# Patient Record
Sex: Female | Born: 1980 | Race: Black or African American | Hispanic: No | Marital: Single | Smoking: Never smoker
Health system: Southern US, Community
[De-identification: ages and names within clinical notes are randomized; demographics above are authoritative.]

## PROBLEM LIST (undated history)

## (undated) DIAGNOSIS — Z8669 Personal history of other diseases of the nervous system and sense organs: Secondary | ICD-10-CM

## (undated) DIAGNOSIS — F419 Anxiety disorder, unspecified: Secondary | ICD-10-CM

## (undated) DIAGNOSIS — G35 Multiple sclerosis: Secondary | ICD-10-CM

## (undated) DIAGNOSIS — N183 Chronic kidney disease, stage 3 unspecified: Secondary | ICD-10-CM

## (undated) DIAGNOSIS — I2699 Other pulmonary embolism without acute cor pulmonale: Secondary | ICD-10-CM

## (undated) DIAGNOSIS — N319 Neuromuscular dysfunction of bladder, unspecified: Secondary | ICD-10-CM

## (undated) DIAGNOSIS — Z86711 Personal history of pulmonary embolism: Secondary | ICD-10-CM

## (undated) DIAGNOSIS — M62838 Other muscle spasm: Secondary | ICD-10-CM

## (undated) DIAGNOSIS — I10 Essential (primary) hypertension: Secondary | ICD-10-CM

## (undated) DIAGNOSIS — Z8614 Personal history of Methicillin resistant Staphylococcus aureus infection: Secondary | ICD-10-CM

## (undated) DIAGNOSIS — I82409 Acute embolism and thrombosis of unspecified deep veins of unspecified lower extremity: Secondary | ICD-10-CM

## (undated) DIAGNOSIS — G43909 Migraine, unspecified, not intractable, without status migrainosus: Secondary | ICD-10-CM

## (undated) DIAGNOSIS — F32A Depression, unspecified: Secondary | ICD-10-CM

## (undated) DIAGNOSIS — N1330 Unspecified hydronephrosis: Secondary | ICD-10-CM

## (undated) DIAGNOSIS — K219 Gastro-esophageal reflux disease without esophagitis: Secondary | ICD-10-CM

## (undated) DIAGNOSIS — Z86718 Personal history of other venous thrombosis and embolism: Secondary | ICD-10-CM

## (undated) DIAGNOSIS — G8929 Other chronic pain: Secondary | ICD-10-CM

## (undated) DIAGNOSIS — R3916 Straining to void: Secondary | ICD-10-CM

## (undated) DIAGNOSIS — Z7901 Long term (current) use of anticoagulants: Secondary | ICD-10-CM

## (undated) DIAGNOSIS — R3915 Urgency of urination: Secondary | ICD-10-CM

## (undated) DIAGNOSIS — R269 Unspecified abnormalities of gait and mobility: Secondary | ICD-10-CM

## (undated) DIAGNOSIS — Z8739 Personal history of other diseases of the musculoskeletal system and connective tissue: Secondary | ICD-10-CM

## (undated) DIAGNOSIS — F329 Major depressive disorder, single episode, unspecified: Secondary | ICD-10-CM

## (undated) DIAGNOSIS — R531 Weakness: Secondary | ICD-10-CM

## (undated) HISTORY — PX: HIP ARTHROSCOPY: SUR88

## (undated) HISTORY — PX: REVISION TOTAL HIP ARTHROPLASTY: SHX766

## (undated) HISTORY — PX: TOTAL HIP ARTHROPLASTY: SHX124

## (undated) HISTORY — DX: Anxiety disorder, unspecified: F41.9

---
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
## 1998-03-30 ENCOUNTER — Emergency Department (HOSPITAL_COMMUNITY): Admission: EM | Admit: 1998-03-30 | Discharge: 1998-03-30 | Payer: Self-pay | Admitting: Emergency Medicine

## 1998-04-11 ENCOUNTER — Emergency Department (HOSPITAL_COMMUNITY): Admission: EM | Admit: 1998-04-11 | Discharge: 1998-04-11 | Payer: Self-pay | Admitting: Emergency Medicine

## 1998-10-23 ENCOUNTER — Emergency Department (HOSPITAL_COMMUNITY): Admission: EM | Admit: 1998-10-23 | Discharge: 1998-10-23 | Payer: Self-pay | Admitting: Emergency Medicine

## 1999-01-08 ENCOUNTER — Emergency Department (HOSPITAL_COMMUNITY): Admission: EM | Admit: 1999-01-08 | Discharge: 1999-01-08 | Payer: Self-pay | Admitting: Emergency Medicine

## 1999-05-24 ENCOUNTER — Other Ambulatory Visit: Admission: RE | Admit: 1999-05-24 | Discharge: 1999-05-24 | Payer: Self-pay | Admitting: Obstetrics

## 1999-05-24 ENCOUNTER — Encounter (INDEPENDENT_AMBULATORY_CARE_PROVIDER_SITE_OTHER): Payer: Self-pay

## 1999-06-14 ENCOUNTER — Emergency Department (HOSPITAL_COMMUNITY): Admission: EM | Admit: 1999-06-14 | Discharge: 1999-06-14 | Payer: Self-pay | Admitting: Emergency Medicine

## 1999-06-29 ENCOUNTER — Encounter: Admission: RE | Admit: 1999-06-29 | Discharge: 1999-06-29 | Payer: Self-pay | Admitting: Obstetrics & Gynecology

## 1999-07-07 ENCOUNTER — Emergency Department (HOSPITAL_COMMUNITY): Admission: EM | Admit: 1999-07-07 | Discharge: 1999-07-07 | Payer: Self-pay | Admitting: Emergency Medicine

## 1999-07-14 ENCOUNTER — Encounter: Admission: RE | Admit: 1999-07-14 | Discharge: 1999-07-14 | Payer: Self-pay | Admitting: Hematology and Oncology

## 1999-07-16 ENCOUNTER — Encounter: Admission: RE | Admit: 1999-07-16 | Discharge: 1999-07-16 | Payer: Self-pay | Admitting: Obstetrics & Gynecology

## 1999-09-14 ENCOUNTER — Encounter: Admission: RE | Admit: 1999-09-14 | Discharge: 1999-09-14 | Payer: Self-pay | Admitting: Obstetrics

## 1999-09-14 ENCOUNTER — Encounter (INDEPENDENT_AMBULATORY_CARE_PROVIDER_SITE_OTHER): Payer: Self-pay | Admitting: *Deleted

## 1999-09-14 ENCOUNTER — Other Ambulatory Visit: Admission: RE | Admit: 1999-09-14 | Discharge: 1999-09-14 | Payer: Self-pay | Admitting: Obstetrics

## 2000-01-25 ENCOUNTER — Other Ambulatory Visit: Admission: RE | Admit: 2000-01-25 | Discharge: 2000-01-25 | Payer: Self-pay | Admitting: Obstetrics & Gynecology

## 2000-01-25 ENCOUNTER — Encounter: Admission: RE | Admit: 2000-01-25 | Discharge: 2000-01-25 | Payer: Self-pay | Admitting: Obstetrics & Gynecology

## 2000-01-25 ENCOUNTER — Encounter (INDEPENDENT_AMBULATORY_CARE_PROVIDER_SITE_OTHER): Payer: Self-pay | Admitting: *Deleted

## 2000-01-25 LAB — CONVERTED CEMR LAB: Pap Smear: NORMAL

## 2000-07-11 ENCOUNTER — Emergency Department (HOSPITAL_COMMUNITY): Admission: EM | Admit: 2000-07-11 | Discharge: 2000-07-11 | Payer: Self-pay | Admitting: Emergency Medicine

## 2000-07-11 ENCOUNTER — Encounter: Payer: Self-pay | Admitting: Emergency Medicine

## 2000-07-28 ENCOUNTER — Emergency Department (HOSPITAL_COMMUNITY): Admission: EM | Admit: 2000-07-28 | Discharge: 2000-07-28 | Payer: Self-pay | Admitting: *Deleted

## 2000-10-29 ENCOUNTER — Emergency Department (HOSPITAL_COMMUNITY): Admission: EM | Admit: 2000-10-29 | Discharge: 2000-10-29 | Payer: Self-pay | Admitting: Emergency Medicine

## 2000-10-29 ENCOUNTER — Encounter: Payer: Self-pay | Admitting: Emergency Medicine

## 2001-03-05 ENCOUNTER — Encounter: Admission: RE | Admit: 2001-03-05 | Discharge: 2001-03-05 | Payer: Self-pay

## 2001-03-12 ENCOUNTER — Encounter: Admission: RE | Admit: 2001-03-12 | Discharge: 2001-03-12 | Payer: Self-pay

## 2001-03-27 ENCOUNTER — Encounter: Admission: RE | Admit: 2001-03-27 | Discharge: 2001-03-27 | Payer: Self-pay | Admitting: Obstetrics & Gynecology

## 2001-05-04 ENCOUNTER — Emergency Department (HOSPITAL_COMMUNITY): Admission: EM | Admit: 2001-05-04 | Discharge: 2001-05-04 | Payer: Self-pay | Admitting: Emergency Medicine

## 2001-05-04 ENCOUNTER — Encounter: Payer: Self-pay | Admitting: Emergency Medicine

## 2001-05-28 ENCOUNTER — Emergency Department (HOSPITAL_COMMUNITY): Admission: EM | Admit: 2001-05-28 | Discharge: 2001-05-28 | Payer: Self-pay | Admitting: Emergency Medicine

## 2001-09-21 ENCOUNTER — Emergency Department (HOSPITAL_COMMUNITY): Admission: EM | Admit: 2001-09-21 | Discharge: 2001-09-21 | Payer: Self-pay | Admitting: Emergency Medicine

## 2001-10-06 ENCOUNTER — Emergency Department (HOSPITAL_COMMUNITY): Admission: EM | Admit: 2001-10-06 | Discharge: 2001-10-07 | Payer: Self-pay | Admitting: Emergency Medicine

## 2001-10-07 ENCOUNTER — Encounter: Payer: Self-pay | Admitting: Emergency Medicine

## 2001-10-19 ENCOUNTER — Emergency Department (HOSPITAL_COMMUNITY): Admission: EM | Admit: 2001-10-19 | Discharge: 2001-10-19 | Payer: Self-pay | Admitting: Emergency Medicine

## 2001-11-23 ENCOUNTER — Encounter: Payer: Self-pay | Admitting: Emergency Medicine

## 2001-11-23 ENCOUNTER — Emergency Department (HOSPITAL_COMMUNITY): Admission: EM | Admit: 2001-11-23 | Discharge: 2001-11-24 | Payer: Self-pay | Admitting: Emergency Medicine

## 2001-12-02 ENCOUNTER — Emergency Department (HOSPITAL_COMMUNITY): Admission: EM | Admit: 2001-12-02 | Discharge: 2001-12-02 | Payer: Self-pay | Admitting: Emergency Medicine

## 2002-01-22 ENCOUNTER — Encounter: Admission: RE | Admit: 2002-01-22 | Discharge: 2002-02-22 | Payer: Self-pay | Admitting: Specialist

## 2002-01-25 ENCOUNTER — Encounter: Admission: RE | Admit: 2002-01-25 | Discharge: 2002-01-25 | Payer: Self-pay | Admitting: Internal Medicine

## 2002-02-15 ENCOUNTER — Encounter: Admission: RE | Admit: 2002-02-15 | Discharge: 2002-02-15 | Payer: Self-pay | Admitting: Internal Medicine

## 2002-03-07 DIAGNOSIS — Z8614 Personal history of Methicillin resistant Staphylococcus aureus infection: Secondary | ICD-10-CM

## 2002-03-07 HISTORY — DX: Personal history of Methicillin resistant Staphylococcus aureus infection: Z86.14

## 2002-03-19 ENCOUNTER — Encounter: Admission: RE | Admit: 2002-03-19 | Discharge: 2002-03-19 | Payer: Self-pay | Admitting: *Deleted

## 2002-03-20 ENCOUNTER — Encounter (INDEPENDENT_AMBULATORY_CARE_PROVIDER_SITE_OTHER): Payer: Self-pay | Admitting: *Deleted

## 2002-03-20 LAB — CONVERTED CEMR LAB: Microalbumin U total vol: 40 mg/L

## 2002-04-04 ENCOUNTER — Encounter: Admission: RE | Admit: 2002-04-04 | Discharge: 2002-04-04 | Payer: Self-pay | Admitting: Internal Medicine

## 2002-05-09 ENCOUNTER — Inpatient Hospital Stay (HOSPITAL_COMMUNITY): Admission: AD | Admit: 2002-05-09 | Discharge: 2002-05-09 | Payer: Self-pay | Admitting: *Deleted

## 2002-05-21 ENCOUNTER — Encounter: Admission: RE | Admit: 2002-05-21 | Discharge: 2002-05-21 | Payer: Self-pay | Admitting: Internal Medicine

## 2002-05-23 ENCOUNTER — Encounter: Payer: Self-pay | Admitting: Internal Medicine

## 2002-05-23 ENCOUNTER — Encounter: Admission: RE | Admit: 2002-05-23 | Discharge: 2002-05-23 | Payer: Self-pay | Admitting: Internal Medicine

## 2002-05-23 ENCOUNTER — Ambulatory Visit (HOSPITAL_COMMUNITY): Admission: RE | Admit: 2002-05-23 | Discharge: 2002-05-23 | Payer: Self-pay | Admitting: Internal Medicine

## 2002-05-28 ENCOUNTER — Encounter: Admission: RE | Admit: 2002-05-28 | Discharge: 2002-05-28 | Payer: Self-pay | Admitting: Internal Medicine

## 2002-06-18 ENCOUNTER — Other Ambulatory Visit: Admission: RE | Admit: 2002-06-18 | Discharge: 2002-06-18 | Payer: Self-pay | Admitting: *Deleted

## 2002-06-18 ENCOUNTER — Encounter (INDEPENDENT_AMBULATORY_CARE_PROVIDER_SITE_OTHER): Payer: Self-pay | Admitting: *Deleted

## 2002-06-18 ENCOUNTER — Encounter: Admission: RE | Admit: 2002-06-18 | Discharge: 2002-06-18 | Payer: Self-pay | Admitting: Obstetrics and Gynecology

## 2002-07-16 ENCOUNTER — Ambulatory Visit (HOSPITAL_COMMUNITY): Admission: RE | Admit: 2002-07-16 | Discharge: 2002-07-16 | Payer: Self-pay | Admitting: Internal Medicine

## 2002-07-16 ENCOUNTER — Encounter: Admission: RE | Admit: 2002-07-16 | Discharge: 2002-07-16 | Payer: Self-pay | Admitting: Internal Medicine

## 2002-07-31 ENCOUNTER — Emergency Department (HOSPITAL_COMMUNITY): Admission: EM | Admit: 2002-07-31 | Discharge: 2002-07-31 | Payer: Self-pay | Admitting: Emergency Medicine

## 2002-07-31 ENCOUNTER — Encounter: Payer: Self-pay | Admitting: Emergency Medicine

## 2002-09-16 ENCOUNTER — Encounter: Admission: RE | Admit: 2002-09-16 | Discharge: 2002-09-16 | Payer: Self-pay | Admitting: Obstetrics and Gynecology

## 2002-09-16 ENCOUNTER — Emergency Department (HOSPITAL_COMMUNITY): Admission: EM | Admit: 2002-09-16 | Discharge: 2002-09-16 | Payer: Self-pay | Admitting: Emergency Medicine

## 2002-10-28 ENCOUNTER — Encounter: Payer: Self-pay | Admitting: Specialist

## 2002-11-05 ENCOUNTER — Encounter: Payer: Self-pay | Admitting: Orthopedic Surgery

## 2002-11-05 ENCOUNTER — Inpatient Hospital Stay (HOSPITAL_COMMUNITY): Admission: RE | Admit: 2002-11-05 | Discharge: 2002-11-08 | Payer: Self-pay | Admitting: Specialist

## 2002-11-06 ENCOUNTER — Encounter: Payer: Self-pay | Admitting: Specialist

## 2002-11-15 ENCOUNTER — Emergency Department (HOSPITAL_COMMUNITY): Admission: EM | Admit: 2002-11-15 | Discharge: 2002-11-15 | Payer: Self-pay | Admitting: Emergency Medicine

## 2002-11-15 ENCOUNTER — Encounter: Payer: Self-pay | Admitting: Emergency Medicine

## 2002-12-10 ENCOUNTER — Encounter: Admission: RE | Admit: 2002-12-10 | Discharge: 2002-12-10 | Payer: Self-pay | Admitting: Obstetrics and Gynecology

## 2002-12-24 ENCOUNTER — Ambulatory Visit (HOSPITAL_COMMUNITY): Admission: RE | Admit: 2002-12-24 | Discharge: 2002-12-24 | Payer: Self-pay | Admitting: Orthopedic Surgery

## 2002-12-24 ENCOUNTER — Encounter: Payer: Self-pay | Admitting: Orthopedic Surgery

## 2002-12-27 ENCOUNTER — Encounter: Admission: RE | Admit: 2002-12-27 | Discharge: 2002-12-27 | Payer: Self-pay | Admitting: Orthopedic Surgery

## 2002-12-27 ENCOUNTER — Encounter: Payer: Self-pay | Admitting: Orthopedic Surgery

## 2003-01-10 ENCOUNTER — Encounter: Admission: RE | Admit: 2003-01-10 | Discharge: 2003-01-10 | Payer: Self-pay | Admitting: Internal Medicine

## 2003-01-24 ENCOUNTER — Inpatient Hospital Stay (HOSPITAL_COMMUNITY): Admission: AD | Admit: 2003-01-24 | Discharge: 2003-01-24 | Payer: Self-pay | Admitting: *Deleted

## 2003-03-08 DIAGNOSIS — Z86711 Personal history of pulmonary embolism: Secondary | ICD-10-CM

## 2003-03-08 HISTORY — DX: Personal history of pulmonary embolism: Z86.711

## 2003-03-10 ENCOUNTER — Encounter: Admission: RE | Admit: 2003-03-10 | Discharge: 2003-03-10 | Payer: Self-pay | Admitting: Obstetrics and Gynecology

## 2003-03-28 ENCOUNTER — Inpatient Hospital Stay (HOSPITAL_COMMUNITY): Admission: RE | Admit: 2003-03-28 | Discharge: 2003-04-02 | Payer: Self-pay | Admitting: Orthopedic Surgery

## 2003-04-11 ENCOUNTER — Emergency Department (HOSPITAL_COMMUNITY): Admission: EM | Admit: 2003-04-11 | Discharge: 2003-04-11 | Payer: Self-pay

## 2003-05-14 ENCOUNTER — Encounter: Admission: RE | Admit: 2003-05-14 | Discharge: 2003-05-14 | Payer: Self-pay | Admitting: Internal Medicine

## 2003-05-14 ENCOUNTER — Inpatient Hospital Stay (HOSPITAL_COMMUNITY): Admission: AD | Admit: 2003-05-14 | Discharge: 2003-05-16 | Payer: Self-pay | Admitting: Internal Medicine

## 2003-05-19 ENCOUNTER — Encounter: Admission: RE | Admit: 2003-05-19 | Discharge: 2003-05-19 | Payer: Self-pay | Admitting: Internal Medicine

## 2003-05-23 ENCOUNTER — Encounter: Admission: RE | Admit: 2003-05-23 | Discharge: 2003-05-23 | Payer: Self-pay | Admitting: Internal Medicine

## 2003-05-26 ENCOUNTER — Encounter: Admission: RE | Admit: 2003-05-26 | Discharge: 2003-05-26 | Payer: Self-pay | Admitting: Internal Medicine

## 2003-06-03 ENCOUNTER — Encounter: Admission: RE | Admit: 2003-06-03 | Discharge: 2003-06-03 | Payer: Self-pay | Admitting: Obstetrics and Gynecology

## 2003-06-03 ENCOUNTER — Encounter: Admission: RE | Admit: 2003-06-03 | Discharge: 2003-06-03 | Payer: Self-pay | Admitting: Internal Medicine

## 2003-06-03 ENCOUNTER — Other Ambulatory Visit: Admission: RE | Admit: 2003-06-03 | Discharge: 2003-06-03 | Payer: Self-pay | Admitting: Obstetrics & Gynecology

## 2003-06-03 ENCOUNTER — Encounter (INDEPENDENT_AMBULATORY_CARE_PROVIDER_SITE_OTHER): Payer: Self-pay | Admitting: Specialist

## 2003-06-12 ENCOUNTER — Encounter: Admission: RE | Admit: 2003-06-12 | Discharge: 2003-06-12 | Payer: Self-pay | Admitting: Internal Medicine

## 2003-06-23 ENCOUNTER — Encounter: Admission: RE | Admit: 2003-06-23 | Discharge: 2003-06-23 | Payer: Self-pay | Admitting: Internal Medicine

## 2003-07-07 ENCOUNTER — Encounter: Admission: RE | Admit: 2003-07-07 | Discharge: 2003-07-07 | Payer: Self-pay | Admitting: Internal Medicine

## 2003-07-14 ENCOUNTER — Emergency Department (HOSPITAL_COMMUNITY): Admission: EM | Admit: 2003-07-14 | Discharge: 2003-07-14 | Payer: Self-pay | Admitting: Family Medicine

## 2003-07-21 ENCOUNTER — Encounter: Admission: RE | Admit: 2003-07-21 | Discharge: 2003-07-21 | Payer: Self-pay | Admitting: Internal Medicine

## 2003-08-12 ENCOUNTER — Encounter: Admission: RE | Admit: 2003-08-12 | Discharge: 2003-08-12 | Payer: Self-pay | Admitting: Internal Medicine

## 2003-09-04 ENCOUNTER — Encounter: Admission: RE | Admit: 2003-09-04 | Discharge: 2003-09-04 | Payer: Self-pay | Admitting: Internal Medicine

## 2004-02-16 ENCOUNTER — Ambulatory Visit: Payer: Self-pay | Admitting: Internal Medicine

## 2004-03-26 ENCOUNTER — Inpatient Hospital Stay (HOSPITAL_COMMUNITY): Admission: AD | Admit: 2004-03-26 | Discharge: 2004-03-26 | Payer: Self-pay | Admitting: *Deleted

## 2004-03-30 ENCOUNTER — Ambulatory Visit: Payer: Self-pay | Admitting: Obstetrics & Gynecology

## 2004-06-08 ENCOUNTER — Ambulatory Visit: Payer: Self-pay | Admitting: Obstetrics & Gynecology

## 2004-06-08 ENCOUNTER — Encounter (INDEPENDENT_AMBULATORY_CARE_PROVIDER_SITE_OTHER): Payer: Self-pay | Admitting: *Deleted

## 2004-08-03 ENCOUNTER — Ambulatory Visit: Payer: Self-pay | Admitting: Obstetrics & Gynecology

## 2004-11-29 ENCOUNTER — Inpatient Hospital Stay (HOSPITAL_COMMUNITY): Admission: AD | Admit: 2004-11-29 | Discharge: 2004-11-29 | Payer: Self-pay | Admitting: *Deleted

## 2005-02-01 ENCOUNTER — Ambulatory Visit: Payer: Self-pay | Admitting: *Deleted

## 2005-02-01 ENCOUNTER — Encounter (INDEPENDENT_AMBULATORY_CARE_PROVIDER_SITE_OTHER): Payer: Self-pay | Admitting: *Deleted

## 2005-02-03 ENCOUNTER — Ambulatory Visit: Payer: Self-pay | Admitting: Internal Medicine

## 2005-02-07 ENCOUNTER — Emergency Department (HOSPITAL_COMMUNITY): Admission: EM | Admit: 2005-02-07 | Discharge: 2005-02-07 | Payer: Self-pay | Admitting: Family Medicine

## 2005-03-02 ENCOUNTER — Emergency Department (HOSPITAL_COMMUNITY): Admission: EM | Admit: 2005-03-02 | Discharge: 2005-03-02 | Payer: Self-pay | Admitting: Family Medicine

## 2005-03-07 DIAGNOSIS — Z86718 Personal history of other venous thrombosis and embolism: Secondary | ICD-10-CM

## 2005-03-07 HISTORY — DX: Personal history of other venous thrombosis and embolism: Z86.718

## 2005-03-22 ENCOUNTER — Ambulatory Visit: Payer: Self-pay | Admitting: Family Medicine

## 2005-04-12 ENCOUNTER — Ambulatory Visit: Payer: Self-pay | Admitting: Family Medicine

## 2005-04-17 ENCOUNTER — Emergency Department (HOSPITAL_COMMUNITY): Admission: EM | Admit: 2005-04-17 | Discharge: 2005-04-17 | Payer: Self-pay | Admitting: Emergency Medicine

## 2005-05-11 ENCOUNTER — Emergency Department (HOSPITAL_COMMUNITY): Admission: EM | Admit: 2005-05-11 | Discharge: 2005-05-11 | Payer: Self-pay | Admitting: Emergency Medicine

## 2005-06-29 ENCOUNTER — Ambulatory Visit: Payer: Self-pay | Admitting: Obstetrics & Gynecology

## 2005-06-29 ENCOUNTER — Encounter (INDEPENDENT_AMBULATORY_CARE_PROVIDER_SITE_OTHER): Payer: Self-pay | Admitting: *Deleted

## 2005-09-09 ENCOUNTER — Ambulatory Visit: Payer: Self-pay | Admitting: Internal Medicine

## 2005-12-29 ENCOUNTER — Emergency Department (HOSPITAL_COMMUNITY): Admission: EM | Admit: 2005-12-29 | Discharge: 2005-12-29 | Payer: Self-pay | Admitting: Family Medicine

## 2006-01-01 ENCOUNTER — Emergency Department (HOSPITAL_COMMUNITY): Admission: EM | Admit: 2006-01-01 | Discharge: 2006-01-01 | Payer: Self-pay | Admitting: Family Medicine

## 2006-01-30 ENCOUNTER — Inpatient Hospital Stay (HOSPITAL_COMMUNITY): Admission: AD | Admit: 2006-01-30 | Discharge: 2006-01-30 | Payer: Self-pay | Admitting: Obstetrics and Gynecology

## 2006-02-03 ENCOUNTER — Encounter (INDEPENDENT_AMBULATORY_CARE_PROVIDER_SITE_OTHER): Payer: Self-pay | Admitting: *Deleted

## 2006-02-03 DIAGNOSIS — I10 Essential (primary) hypertension: Secondary | ICD-10-CM

## 2006-02-03 DIAGNOSIS — R809 Proteinuria, unspecified: Secondary | ICD-10-CM | POA: Insufficient documentation

## 2006-02-03 DIAGNOSIS — Z86718 Personal history of other venous thrombosis and embolism: Secondary | ICD-10-CM

## 2006-03-02 ENCOUNTER — Inpatient Hospital Stay (HOSPITAL_COMMUNITY): Admission: AD | Admit: 2006-03-02 | Discharge: 2006-03-02 | Payer: Self-pay | Admitting: Family Medicine

## 2006-03-11 DIAGNOSIS — M87059 Idiopathic aseptic necrosis of unspecified femur: Secondary | ICD-10-CM | POA: Insufficient documentation

## 2006-03-27 ENCOUNTER — Inpatient Hospital Stay (HOSPITAL_COMMUNITY): Admission: AD | Admit: 2006-03-27 | Discharge: 2006-03-27 | Payer: Self-pay | Admitting: Obstetrics & Gynecology

## 2006-04-13 ENCOUNTER — Ambulatory Visit: Payer: Self-pay | Admitting: Gynecology

## 2006-04-13 ENCOUNTER — Encounter (INDEPENDENT_AMBULATORY_CARE_PROVIDER_SITE_OTHER): Payer: Self-pay | Admitting: Gynecology

## 2006-06-07 ENCOUNTER — Encounter (INDEPENDENT_AMBULATORY_CARE_PROVIDER_SITE_OTHER): Payer: Self-pay | Admitting: Hospitalist

## 2006-06-21 ENCOUNTER — Ambulatory Visit: Payer: Self-pay | Admitting: Obstetrics and Gynecology

## 2006-07-10 ENCOUNTER — Inpatient Hospital Stay (HOSPITAL_COMMUNITY): Admission: AD | Admit: 2006-07-10 | Discharge: 2006-07-11 | Payer: Self-pay | Admitting: Family Medicine

## 2006-09-18 ENCOUNTER — Encounter (INDEPENDENT_AMBULATORY_CARE_PROVIDER_SITE_OTHER): Payer: Self-pay | Admitting: *Deleted

## 2006-11-15 ENCOUNTER — Inpatient Hospital Stay (HOSPITAL_COMMUNITY): Admission: AD | Admit: 2006-11-15 | Discharge: 2006-11-15 | Payer: Self-pay | Admitting: Obstetrics & Gynecology

## 2006-12-21 ENCOUNTER — Ambulatory Visit: Payer: Self-pay | Admitting: Obstetrics & Gynecology

## 2006-12-21 ENCOUNTER — Encounter (INDEPENDENT_AMBULATORY_CARE_PROVIDER_SITE_OTHER): Payer: Self-pay | Admitting: Obstetrics & Gynecology

## 2007-01-25 ENCOUNTER — Inpatient Hospital Stay (HOSPITAL_COMMUNITY): Admission: AD | Admit: 2007-01-25 | Discharge: 2007-01-25 | Payer: Self-pay | Admitting: Obstetrics and Gynecology

## 2007-04-03 ENCOUNTER — Encounter (INDEPENDENT_AMBULATORY_CARE_PROVIDER_SITE_OTHER): Payer: Self-pay | Admitting: *Deleted

## 2007-05-16 ENCOUNTER — Emergency Department (HOSPITAL_COMMUNITY): Admission: EM | Admit: 2007-05-16 | Discharge: 2007-05-16 | Payer: Self-pay | Admitting: Family Medicine

## 2007-05-17 ENCOUNTER — Encounter (INDEPENDENT_AMBULATORY_CARE_PROVIDER_SITE_OTHER): Payer: Self-pay | Admitting: *Deleted

## 2007-05-20 ENCOUNTER — Emergency Department (HOSPITAL_COMMUNITY): Admission: EM | Admit: 2007-05-20 | Discharge: 2007-05-20 | Payer: Self-pay | Admitting: Emergency Medicine

## 2007-06-22 ENCOUNTER — Ambulatory Visit: Payer: Self-pay | Admitting: Internal Medicine

## 2007-06-22 DIAGNOSIS — R21 Rash and other nonspecific skin eruption: Secondary | ICD-10-CM

## 2007-06-25 ENCOUNTER — Telehealth: Payer: Self-pay | Admitting: *Deleted

## 2007-07-05 ENCOUNTER — Inpatient Hospital Stay (HOSPITAL_COMMUNITY): Admission: AD | Admit: 2007-07-05 | Discharge: 2007-07-05 | Payer: Self-pay | Admitting: Obstetrics & Gynecology

## 2007-07-19 ENCOUNTER — Encounter (INDEPENDENT_AMBULATORY_CARE_PROVIDER_SITE_OTHER): Payer: Self-pay | Admitting: *Deleted

## 2007-07-26 ENCOUNTER — Inpatient Hospital Stay (HOSPITAL_COMMUNITY): Admission: AD | Admit: 2007-07-26 | Discharge: 2007-07-26 | Payer: Self-pay | Admitting: Obstetrics & Gynecology

## 2007-08-01 ENCOUNTER — Ambulatory Visit: Payer: Self-pay | Admitting: Obstetrics & Gynecology

## 2007-08-08 ENCOUNTER — Encounter (INDEPENDENT_AMBULATORY_CARE_PROVIDER_SITE_OTHER): Payer: Self-pay | Admitting: *Deleted

## 2007-08-08 DIAGNOSIS — G35 Multiple sclerosis: Secondary | ICD-10-CM | POA: Insufficient documentation

## 2007-10-23 ENCOUNTER — Encounter (INDEPENDENT_AMBULATORY_CARE_PROVIDER_SITE_OTHER): Payer: Self-pay | Admitting: *Deleted

## 2007-11-20 ENCOUNTER — Encounter (INDEPENDENT_AMBULATORY_CARE_PROVIDER_SITE_OTHER): Payer: Self-pay | Admitting: *Deleted

## 2007-12-11 ENCOUNTER — Encounter (INDEPENDENT_AMBULATORY_CARE_PROVIDER_SITE_OTHER): Payer: Self-pay | Admitting: *Deleted

## 2008-01-08 ENCOUNTER — Emergency Department (HOSPITAL_COMMUNITY): Admission: EM | Admit: 2008-01-08 | Discharge: 2008-01-08 | Payer: Self-pay | Admitting: Family Medicine

## 2008-01-15 ENCOUNTER — Encounter (INDEPENDENT_AMBULATORY_CARE_PROVIDER_SITE_OTHER): Payer: Self-pay | Admitting: *Deleted

## 2008-02-12 ENCOUNTER — Encounter (INDEPENDENT_AMBULATORY_CARE_PROVIDER_SITE_OTHER): Payer: Self-pay | Admitting: *Deleted

## 2008-02-20 ENCOUNTER — Encounter: Payer: Self-pay | Admitting: Obstetrics & Gynecology

## 2008-02-20 ENCOUNTER — Ambulatory Visit: Payer: Self-pay | Admitting: Obstetrics & Gynecology

## 2008-02-20 LAB — CONVERTED CEMR LAB: Chlamydia, DNA Probe: NEGATIVE

## 2008-02-21 ENCOUNTER — Encounter: Payer: Self-pay | Admitting: Obstetrics & Gynecology

## 2008-02-21 LAB — CONVERTED CEMR LAB

## 2008-03-10 ENCOUNTER — Encounter (INDEPENDENT_AMBULATORY_CARE_PROVIDER_SITE_OTHER): Payer: Self-pay | Admitting: *Deleted

## 2008-03-27 ENCOUNTER — Encounter (INDEPENDENT_AMBULATORY_CARE_PROVIDER_SITE_OTHER): Payer: Self-pay | Admitting: *Deleted

## 2008-03-30 ENCOUNTER — Emergency Department (HOSPITAL_COMMUNITY): Admission: EM | Admit: 2008-03-30 | Discharge: 2008-03-30 | Payer: Self-pay | Admitting: Family Medicine

## 2008-04-17 ENCOUNTER — Encounter (INDEPENDENT_AMBULATORY_CARE_PROVIDER_SITE_OTHER): Payer: Self-pay | Admitting: *Deleted

## 2008-04-17 ENCOUNTER — Ambulatory Visit: Payer: Self-pay | Admitting: Obstetrics & Gynecology

## 2008-05-03 ENCOUNTER — Inpatient Hospital Stay (HOSPITAL_COMMUNITY): Admission: AD | Admit: 2008-05-03 | Discharge: 2008-05-03 | Payer: Self-pay | Admitting: Family Medicine

## 2008-05-07 ENCOUNTER — Encounter (INDEPENDENT_AMBULATORY_CARE_PROVIDER_SITE_OTHER): Payer: Self-pay | Admitting: *Deleted

## 2008-06-20 ENCOUNTER — Encounter (INDEPENDENT_AMBULATORY_CARE_PROVIDER_SITE_OTHER): Payer: Self-pay | Admitting: *Deleted

## 2008-08-01 ENCOUNTER — Encounter (INDEPENDENT_AMBULATORY_CARE_PROVIDER_SITE_OTHER): Payer: Self-pay | Admitting: Internal Medicine

## 2008-10-06 ENCOUNTER — Encounter (INDEPENDENT_AMBULATORY_CARE_PROVIDER_SITE_OTHER): Payer: Self-pay | Admitting: Internal Medicine

## 2008-12-08 ENCOUNTER — Encounter (INDEPENDENT_AMBULATORY_CARE_PROVIDER_SITE_OTHER): Payer: Self-pay | Admitting: Internal Medicine

## 2009-01-21 ENCOUNTER — Encounter: Payer: Self-pay | Admitting: Obstetrics & Gynecology

## 2009-01-21 ENCOUNTER — Ambulatory Visit: Payer: Self-pay | Admitting: Obstetrics and Gynecology

## 2009-01-21 LAB — CONVERTED CEMR LAB
Chlamydia, DNA Probe: NEGATIVE
GC Probe Amp, Genital: NEGATIVE

## 2009-01-22 ENCOUNTER — Encounter: Payer: Self-pay | Admitting: Obstetrics & Gynecology

## 2009-01-22 LAB — CONVERTED CEMR LAB
Clue Cells Wet Prep HPF POC: NONE SEEN
Trich, Wet Prep: NONE SEEN

## 2009-02-20 ENCOUNTER — Ambulatory Visit: Payer: Self-pay | Admitting: Obstetrics & Gynecology

## 2009-02-20 ENCOUNTER — Encounter (INDEPENDENT_AMBULATORY_CARE_PROVIDER_SITE_OTHER): Payer: Self-pay | Admitting: Internal Medicine

## 2009-02-20 LAB — CONVERTED CEMR LAB

## 2009-04-25 ENCOUNTER — Emergency Department (HOSPITAL_COMMUNITY): Admission: EM | Admit: 2009-04-25 | Discharge: 2009-04-25 | Payer: Self-pay | Admitting: Emergency Medicine

## 2009-08-31 ENCOUNTER — Encounter: Admission: RE | Admit: 2009-08-31 | Discharge: 2009-08-31 | Payer: Self-pay | Admitting: Orthopedic Surgery

## 2009-09-01 ENCOUNTER — Ambulatory Visit (HOSPITAL_COMMUNITY): Admission: RE | Admit: 2009-09-01 | Discharge: 2009-09-01 | Payer: Self-pay | Admitting: Orthopedic Surgery

## 2009-09-17 ENCOUNTER — Encounter (INDEPENDENT_AMBULATORY_CARE_PROVIDER_SITE_OTHER): Payer: Self-pay | Admitting: *Deleted

## 2009-09-17 ENCOUNTER — Ambulatory Visit: Payer: Self-pay | Admitting: Obstetrics and Gynecology

## 2009-09-17 LAB — CONVERTED CEMR LAB: GC Probe Amp, Genital: NEGATIVE

## 2009-12-16 ENCOUNTER — Emergency Department (HOSPITAL_COMMUNITY)
Admission: EM | Admit: 2009-12-16 | Discharge: 2009-12-16 | Payer: Self-pay | Source: Home / Self Care | Admitting: Emergency Medicine

## 2010-01-04 ENCOUNTER — Emergency Department (HOSPITAL_COMMUNITY): Admission: EM | Admit: 2010-01-04 | Discharge: 2010-01-04 | Payer: Self-pay | Admitting: Family Medicine

## 2010-02-15 ENCOUNTER — Ambulatory Visit: Payer: Self-pay | Admitting: Obstetrics and Gynecology

## 2010-03-28 ENCOUNTER — Encounter: Payer: Self-pay | Admitting: Orthopedic Surgery

## 2010-04-05 ENCOUNTER — Other Ambulatory Visit: Payer: Self-pay | Admitting: Family Medicine

## 2010-04-05 ENCOUNTER — Ambulatory Visit
Admission: RE | Admit: 2010-04-05 | Discharge: 2010-04-05 | Payer: Self-pay | Source: Home / Self Care | Attending: Obstetrics & Gynecology | Admitting: Obstetrics & Gynecology

## 2010-04-05 DIAGNOSIS — I10 Essential (primary) hypertension: Secondary | ICD-10-CM

## 2010-04-06 NOTE — Progress Notes (Signed)
NAMEHUGH, [name] NO.:  [id number]  MEDICAL RECORD NO.:  [id number]          PATIENT TYPE:  WOC  LOCATION:  [facility]                   FACILITY:  [facility]  PHYSICIAN:  Maryelizabeth Kaufmann, MD  DATE OF BIRTH:  May 18, 1980  DATE OF SERVICE:                                 CLINIC NOTE  CHIEF COMPLAINT:  Pap smear and annual examination.  HISTORY OF PRESENT ILLNESS:  This is a 30 year old gravida 1, para 1, who presents for annual examination.  She denies any acute complaints. No vaginal discharge, itching, odor and her last menstrual period is March 29, 2010.  She does have a history of hypertension and she states that she did run out of her lisinopril a week ago, which she is getting it filled today and her blood pressure today initially was 142/92 and then repeat was 135/95.  PHYSICAL EXAMINATION:  VITAL SIGNS:  Blood pressure is as noted, pulse 78, temperature 98.1, weight 121.5 and height 65 inches. LUNGS:  Clear to auscultation bilaterally.  No wheezing, rales, or rhonchi. CARDIOVASCULAR:  Regular rate and rhythm.  No murmurs, rubs, or gallops. HEAD AND NECK:  Thyroid without any thyromegaly.  No nodules and nontender. BREAST:  Soft and nontender.  No masses were palpated.  No axillary lymphadenopathy and no nipple discharge. ABDOMEN:  Positive bowel sounds.  Soft, nontender, and nondistended. Did have abdominal bruits bilaterally, which was suggested __________ EXTREMITIES:  No clubbing, cyanosis, or edema. GU:  External genitalia is normal.  Sterile speculum exam is normal vaginal mucosa.  Cervix is parous, but otherwise without any visible lesions.  Pap smear was performed.  Bimanual exam, retroverted uterus. No adnexal tenderness to palpation. Otherwise, uterus is soft, mobile, and nontender.  ASSESSMENT AND PLAN:  This is a 30 year old gravida 1, para 1-0-0-1, who presents for annual Pap smear and a history of hypertension.  PLAN: 1. Hypertension  with abdominal bruits.  We will order renal ultrasound     today to rule out renal artery stenosis with Dopplers and the     patient will follow up with her primary care physician and possibly     further management of high blood pressure. 2. Annual Pap smear.  If that turns out to be normal.  Patient     otherwise can resume her annual Pap smears in 1 year.          ______________________________ Maryelizabeth Kaufmann, MD    LC/MEDQ  D:  04/05/2010  T:  04/06/2010  Job:  [id number]

## 2010-04-09 ENCOUNTER — Ambulatory Visit (HOSPITAL_COMMUNITY)
Admission: RE | Admit: 2010-04-09 | Discharge: 2010-04-09 | Disposition: A | Discharge status: Home / Self Care | Payer: PRIVATE HEALTH INSURANCE | Source: Ambulatory Visit | Attending: Family Medicine | Admitting: Family Medicine

## 2010-04-09 DIAGNOSIS — R0989 Other specified symptoms and signs involving the circulatory and respiratory systems: Secondary | ICD-10-CM | POA: Insufficient documentation

## 2010-04-09 DIAGNOSIS — I10 Essential (primary) hypertension: Secondary | ICD-10-CM | POA: Insufficient documentation

## 2010-06-09 LAB — POCT PREGNANCY, URINE: Preg Test, Ur: NEGATIVE

## 2010-06-10 ENCOUNTER — Other Ambulatory Visit: Payer: Self-pay | Admitting: Psychiatry

## 2010-06-10 DIAGNOSIS — Q232 Congenital mitral stenosis: Secondary | ICD-10-CM

## 2010-06-30 ENCOUNTER — Ambulatory Visit: Payer: Medicaid Other | Admitting: Obstetrics and Gynecology

## 2010-07-13 ENCOUNTER — Ambulatory Visit
Admission: RE | Admit: 2010-07-13 | Discharge: 2010-07-13 | Disposition: A | Discharge status: Home / Self Care | Payer: PRIVATE HEALTH INSURANCE | Source: Ambulatory Visit | Attending: Psychiatry | Admitting: Psychiatry

## 2010-07-13 DIAGNOSIS — Q232 Congenital mitral stenosis: Secondary | ICD-10-CM

## 2010-07-13 MED ORDER — GADOBENATE DIMEGLUMINE 529 MG/ML IV SOLN
10.0000 mL | Freq: Once | INTRAVENOUS | Status: AC | PRN
  Administered 2010-07-13: 10 mL via INTRAVENOUS

## 2010-07-14 ENCOUNTER — Ambulatory Visit: Payer: Medicaid Other | Admitting: Obstetrics and Gynecology

## 2010-07-20 NOTE — Group Therapy Note (Signed)
NAME:  Dawn Peters, SERVELLO NO.:  [id number]   MEDICAL RECORD NO.:  [id number]          PATIENT TYPE:  WOC   LOCATION:  [facility]                   FACILITY:  [facility]   PHYSICIAN:  Scheryl Darter, MD       DATE OF BIRTH:  Jun 04, 1980   DATE OF SERVICE:  04/17/2008                                  CLINIC NOTE   The patient comes stating that she has a lump at her vagina.  The  patient is a 30 year old black female gravida 1, para 1.  Last menstrual  period on April 08, 2008, who is on oral contraceptives.  About a week  ago, she noticed a bump at her vagina, which is not painful.  No vaginal  discharge or odor or itching.   PAST MEDICAL HISTORY:  Multiple sclerosis.   PAST SURGICAL HISTORY:  Bilateral hip replacements.   MEDICATIONS:  1. Baclofen 20 mg t.i.d.  2. Camila oral contraceptive daily.  3. Neurontin 800 mg p.o. t.i.d.  4. Tysabri IV one placed for MS.  5. Multivitamin.  6. Vitamin C.   ALLERGIES:  No known drug allergies.   PHYSICAL EXAMINATION:  GENERAL:  The patient is in no acute distress.  Afebrile.  PELVIC:  External genitalia appeared normal.  Just outside the introitus  on the right side is a 1.5 cm epidermal inclusion cyst with a smooth  skin covering it and this area is nontender.  There appears to be clear  fluid within the cyst.   She gave verbal consent for I and D.  The area was prepped with  Betadine.  The cyst was insensitive to touch with a needle.  A #10 blade  was used to incise the cyst and mucus was expressed and there was  minimal if any bleeding.  I advised pelvic rest for about a week and she  may return in 2 weeks to examine the area.      Scheryl Darter, MD     JA/MEDQ  D:  04/17/2008  T:  04/18/2008  Job:  [id number]

## 2010-07-20 NOTE — Group Therapy Note (Signed)
NAMERIDLEY, DILEO NO.:  [id number]   MEDICAL RECORD NO.:  [id number]          PATIENT TYPE:  WOC   LOCATION:  [facility]                   [facility]ACILITY:  [facility]   PHYSICIAN:  Sid Falcon, CNM  DATE OF BIRTH:  01-Mar-1981   DATE OF SERVICE:  08/01/2007                                  CLINIC NOTE   Dictating for Charna Elizabeth, CNM Student   Ms. Flor Lauderbaugh is a 30 year old, who presents today with complaints  of bacterial infection and was diagnosed in maternity admissions unit.  She was placed on metronidazole and Terazol for also yeast infection.   S:  Vaginal discharge times two to three weeks.  Cottage cheese, whitish  discharge, no bleeding.  Itches occasionally in vagina.  Patient is on  day #6 of Terazol 7-day cream.  However, the discharge is not resolving.  Denies pelvic pain, abdominal pain, fever, dysuria or other symptoms.   CLINICAL:  Clinical history of nonpainful cyst on labia minora, right  side.   OBJECTIVE:  EXAM:  PATIENT HEART:  Regular rate and rhythm.  LUNGS:  Clear to auscultation bilaterally.  Alert and oriented times three, no acute distress.  ABDOMEN:  Soft, nontender, no pelvic pain.  Bowel sounds positive.  VAGINA, EXTERNAL GENITALIA:  Within normal limits.  White cyst on inner  labia minora, nontender, 1 x 1 cm, with clear fluid within.  White,  creamy sour-smelling vaginal discharge.  Vagina pink.  Wet prep  collected with Chlamydia and Gonorrhea, as well.   ASSESSMENT:  1. Vaginitis.  2. Vaginal discharge.   PLAN:  Chlamydia and Gonorrhea to lab.  Wet prep results to lab, as  well.   Continue Terazol.  If symptoms do not respond 48 hours after Terazol,  take Diflucan 150 mg one p.o. times one and follow up as needed.      Sid Falcon, CNM     WM/MEDQ  D:  08/01/2007  T:  08/01/2007  Job:  [id number]

## 2010-07-20 NOTE — H&P (Signed)
NAME:  JNYA, BROSSARD NO.:  [id number]   MEDICAL RECORD NO.:  [id number]          PATIENT TYPE:  WOC   LOCATION:  [facility]                   FACILITY:  [facility]   PHYSICIAN:  Johnella Moloney, MD        DATE OF BIRTH:  1980/07/23   DATE OF SERVICE:                           PRE-OP HISTORY & PHYSICAL   This 30 year old black female returns today for annual Pap smear and she  desires STD testing.  Her last Chlamydia and gonorrhea test was done in  April and was negative.  She has a history of multiple sclerosis.  She  has had both of her hips replaced because of complications from the  steroids she was on in the past.  She had to have a PICC line and that  because she developed MRSA and caused her to have a blood clot around  her heart, by her history.  For birth control she is on Camila, which is  a norethindrone only pill.  She also uses condoms.  She has no problems  with her period, the last one started September 30th.  She is pretty  much asymptomatic.   PHYSICAL EXAM:  Height 5 feet 5 inches, weight 121, blood pressure  120/74.  BREASTS:  Bilaterally normal.  ABDOMEN:  Soft and nontender.  She has a superficial skin infection in  the right lower quadrant secondary to an injection that she gave  herself.  The area is slightly tender.  The abdomen does not have any  masses or tenderness.  PELVIC EXAM:  External genitalia, BUS, Skene is normal.  Vaginal wall  deepithelialized as was the cervix.  Uterus is in the midline, normal  size and shape.  Adnexal structures are normal.   IMPRESSION:  1. Normal gynecologic exam.  2. Superficial abdominal skin infection.   DISPOSITION:  1. Pap smear with GC and Chlamydia probe.  2. Prescription for Camila with refills for a year.  3. She is instructed to cleanse the area on her abdomen with soap and      water every day and to put an antibiotic-containing Band-Aid on the      area until it is healed.     ______________________________  Dorthula Perfect, MD    ______________________________  Johnella Moloney, MD    ER/MEDQ  D:  12/21/2006  T:  12/21/2006  Job:  [id number]

## 2010-07-20 NOTE — Group Therapy Note (Signed)
NAMEJAZZMA, Dawn [name]:  [id number]   MEDICAL RECORD NO.:  [id number]          PATIENT TYPE:  WOC   LOCATION:  [facility]                   [facility]ACILITY:  [facility]   PHYSICIAN:  Allie Bossier, MD        DATE OF BIRTH:  1980-09-16   DATE OF SERVICE:  02/18/2997                                  CLINIC NOTE   CHIEF COMPLAINT:  Annual Pap smear.   HISTORY OF PRESENT ILLNESS:  This is a  30 year old G1, P1-0-0-1 female  who presents for annual Pap smear.  Patient takes Shaune Pascal for birth  control and has no problems with this.  She has normal periods every 28  days that are 3-5 days, and heavy on the first day and light at the end  of her period.  She has no pain with her period.  She is sexually  active.  She does not complain of any vaginal discharge today, but is at  risk for sexually transmitted diseases.  She is sexually active.  She  does also use condoms.  Her last period was slightly unusual for her in  that she spotted for two days and then stopped for a day and then had  heavy bleeding for one day and then it was over, but this was the first  time that she had an abnormal period.  She desires a pregnancy test for  this reason.  Otherwise she is asymptomatic.   PAST MEDICAL HISTORY:  Positive for multiple sclerosis.   PAST SURGICAL HISTORY:  Positive for bilateral hip replacements  secondary to many years of high-dose steroids for her multiple  sclerosis.   CURRENT MEDICATIONS:  1. Baclofen 20 mg p.o. t.i.d.  2. Camilla oral contraceptive pill daily.  3. Neurontin 800 mg p.o. t.i.d.  4. Tysabri IV monthly for her multiple sclerosis.  5. Multivitamin.  6. Vitamin C.   ALLERGIES:  No known drug allergies.   FAMILY HISTORY:  Negative for breast cancer, or gynecologic cancer, and  negative for heart disease.   SOCIAL HISTORY:  The patient is currently unemployed secondary to  complications from her multiple sclerosis.  She does not smoke, drink  alcohol, or  use drugs.   PHYSICAL EXAMINATION:  VITAL SIGNS:  Temperature is 97.1, pulse is 81,  blood pressure is 121/80.  Patient is 5 foot 5 inches tall and weighs  126.2 pounds.  GENERAL:  Pleasant female in no acute distress.  BREASTS:  Normal bilaterally without any concerning lesions or masses.  No nipple discharge.  No skin changes.  ABDOMEN:  Soft and nontender.  PELVIC EXAM:  Patient has normal external genitalia without lesions.  Vaginal mucosa is within normal limits.  No vaginal discharge is  present.  Cervix has no lesions.  Uterus is of normal size and  nontender.  No adnexal masses or tenderness.   ASSESSMENT/PLAN:  This is a 30 year old female who presents for annual  exam.  Pap smear was performed.  Gonorrhea and chlamydia probes were  collected.  A urine-pregnancy test was collected which was negative.  Her Camilla birth control pills were refilled for one  year.  It was  recommended that the patient start regular exercise at least 30 minutes  most days of the week, and it was recommended that she begin taking  calcium and vitamin D daily.  She can followup in one year unless she  has any problems before that time.     ______________________________  Levander Campion    ______________________________  Allie Bossier, MD    JH/MEDQ  D:  02/20/2008  T:  02/20/2008  Job:  [id number]

## 2010-07-21 ENCOUNTER — Other Ambulatory Visit: Payer: Self-pay | Admitting: Obstetrics and Gynecology

## 2010-07-21 ENCOUNTER — Ambulatory Visit: Payer: PRIVATE HEALTH INSURANCE | Admitting: Obstetrics and Gynecology

## 2010-07-21 DIAGNOSIS — Z124 Encounter for screening for malignant neoplasm of cervix: Secondary | ICD-10-CM

## 2010-07-21 DIAGNOSIS — Z01419 Encounter for gynecological examination (general) (routine) without abnormal findings: Secondary | ICD-10-CM

## 2010-07-21 DIAGNOSIS — N898 Other specified noninflammatory disorders of vagina: Secondary | ICD-10-CM

## 2010-07-22 NOTE — Group Therapy Note (Signed)
NAMEFREDRICK, DRAY NO.:  [id number]  MEDICAL RECORD NO.:  [id number]           PATIENT TYPE:  A  LOCATION:  [facility]                   FACILITY:  [facility]  PHYSICIAN:  Argentina Donovan, MD        DATE OF BIRTH:  12/08/1980  DATE OF SERVICE:  07/21/2010                                 CLINIC NOTE  The patient is a 30 year old African American female gravida 1, para 1-0- 0-1 who is in because of a vaginal discharge.  She takes [place] birth control pills for contraception and is married.  She has no other complaints except she has multiple sclerosis and is on medications for that.  Blood pressure is 112/69, her weights 118.  She is 5 feet 5 inches tall.  Genitalia, external is normal.  BUS within normal limits. Vagina is well rugated, has a thick white discharge.  Positive whiff test.  Pap smear was taken as well as a wet prep.  The uterus is levo deviated of normal size, shape and consistency.  The adnexa is normal.  IMPRESSION:  Bacterial vaginosis, multiple sclerosis.  The patient is given a prescription for Flagyl for 7 days 500 mg b.i.d. and for Camilla birth control.          ______________________________ Argentina Donovan, MD    PR/MEDQ  D:  07/21/2010  T:  07/22/2010  Job:  [id number]

## 2010-07-23 NOTE — Op Note (Signed)
NAME:  Dawn Peters, Dawn Peters                       ACCOUNT NO.:  [id number]   MEDICAL RECORD NO.:  [id number]                   PATIENT TYPE:  INP   LOCATION:  0482                                 FACILITY:  [facility]   PHYSICIAN:  Madlyn Frankel. Charlann Boxer, M.D.               DATE OF BIRTH:  Mar 21, 1980   DATE OF PROCEDURE:  03/28/2003  DATE OF DISCHARGE:                                 OPERATIVE REPORT   PREOPERATIVE DIAGNOSES:  1. Failed right total hip replacement.  2. Loose acetabular component.   POSTOPERATIVE DIAGNOSES/FINDINGS:  Loose acetabular components with no  evidence of bony ingrowth.   PROCEDURE:  Revision right acetabular component with bone graft.   COMPONENTS USED:  Depuy pinnacle cup size 54 mm, two cancellous bone screws,  a 36 mm liner and a 36 plus 5 ball.   SURGEON:  Madlyn Frankel. Charlann Boxer, M.D.   ASSISTANT:  Clarene Reamer, P.A.-C.   ANESTHESIA:  General.   DRAINS:  Drains x1.   COMPLICATIONS:  None apparent.   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 She is status post right total hip replacement in June  of 2004.  She was subsequently status post left total hip replacement in  September or October of 2004.  In the postoperative period of the left total  hip replacement, she began to notice right hip pain.  This was worked up for  infection and loosening from bone scan.  Radiographically there always  appeared to be a lucency behind the cup and infection workup was negative.  After attempting to watch this conservatively at some point she continued to  have pain with activities.  We discussed the risks and benefits of revision  surgery and she consents for right hip revision surgery.   DESCRIPTION OF PROCEDURE:  The patient was brought to the operative theatre.  Once adequate anesthesia and preoperative antibiotics were administered, the  patient was positioned in the left lateral decubitus position with the right  side up. The right lower extremity was then prepped and draped in a sterile  fashion. A previous incision was done, sharp dissection was carried down to  the level of the iliotibial band and gluteus fascia which was incised in  line with the incision.  The posterior capsule and tissue was then excised  in a single layer for layer repair.  There was known to be some metalosis  within the hip capsule but not extensive. What was evident was the  significant amount of scar around the prosthesis.  After debridement the hip  was dislocated, the nodular ball removed and attention directed towards  exposing the acetabulum.  The acetabulum exposed and the femoral components  rotated anterior superior. The metal liner was removed by hitting the  perimeter of the cup with a bone tamp and breaking the  taper.  At this  point, the single screw that had been placed previously was removed.  At  this point, the bone tamp was used to smack the rim of the cup again and it  was noted to be loose.  The cup spun around and was removed fairly easily  with the use of a Kocher.  At this point, further debridement was carried  out around the rim of the soft tissues. The reaming commenced with a small  reamer, a 45 reaming was carried out.  The components that were removed were  a 48 mm cup.  Reaming then was carried up to a 53 reamer.  The preoperative  radiograph indicated that the medial wall had been breached from a minimal  standpoint.  For this reason, we had bone graft repaired from the symphony  bone graft with DePuy.  With a combination of allograft and autograft, the  graft was then packed to the base of the cup after it had been debrided at  the base of the acetabulum. The final 54 cup was then impacted into position  and it was determined to be around 35-40 degrees of abduction and 20 degrees  of forward flexion. The previous cup  position seemed to be in excess of 50  degrees of abduction and forward flexion well under the anterior wall flexed  probably 40 degrees.  Following placement of the cup and the trial reduction  was carried out prior to placement of any screws. The hip was noted to be  stable. Note that the previous femoral head was a 28 plus 5 ball.  For this  reason, the trial reduction with a 36 plus 5 ball was carried out as we did  not want to loose any length or stability.  The hip was noted to be very  stable impinging on the fossa anteriorly.  At this point, the trial  components were removed, the two cancellous screws were placed into the dome  of the acetabulum and the final liner placed. The final 36 plus 5  articulating ball was then impacted onto a dried trunnion. The hip was  reduced, hip was copiously irrigated with normal saline solution. The final  portion of this symphony platelet rich graft was sprayed into the wound. The  posterior capsular layer was then reapproximated to the posterior aspect of  the trochanter and gluteus medius tendon.  The iliotibial band and gluteus  maximus fascia were then reapproximated using #1 Vicryl.  The remaining  symphony platelet rich autograft was sprayed into the wound.  2-0 Vicryl was  used subcutaneously followed by running 4-0 Monocryl. The hip was cleaned,  dried and dressed sterilely with Steri-Strips, dressings, sponges and tape.  The patient was transferred to the hospital bed and extubated in stable  condition and transferred to the recovery room.                                               Madlyn Frankel Charlann Boxer, M.D.    MDO/MEDQ  D:  03/28/2003  T:  03/28/2003  Job:  [id number]

## 2010-07-23 NOTE — Discharge Summary (Signed)
NAME:  Dawn Peters, Dawn Peters                       ACCOUNT NO.:  [id number]   MEDICAL RECORD NO.:  [id number]                   PATIENT TYPE:  INP   LOCATION:  6707                                 FACILITY:  [facility]   PHYSICIAN:  Ileana Roup, M.D.               DATE OF BIRTH:  04-Jan-1981   DATE OF ADMISSION:  05/14/2003  DATE OF DISCHARGE:  05/16/2003                                 DISCHARGE SUMMARY   DISCHARGE DIAGNOSES:  1. Upper extremity deep vein thrombosis associated with a PICC line.  2. Recent history of MRSA infection status post six weeks of IV Vancomycin.  3. Status post hip replacement June 2004 and August 2004 secondary to     avascular necrosis.  4. Hypertension.  5. Microalbuminuria.  6. Anemia.  7. Migraine disorder.   DISCHARGE MEDICATIONS:  1. Lovenox 55 mg subcutaneously q.12h.  2. Coumadin 10 mg a day.  3. The patient was instructed to continue taking the rest of her home     medicines as she was on before coming to the hospital. These include:     a. Baclofen 20 mg four times a day.     b. Neurontin 600 mg t.i.d.     c. __________ injections one time every other day.     d. Lisinopril 10 mg a day.     e. Rifampin 30 mg a day.     f. Doxycycline 100 mg b.i.d.     g. Depo shots every three months.     h. __________ p.r.n. migraines.        a. Methocarbamol 500 mg q.6-8h.     i. Celexa 20 mg a day.     j. Vitamin C 500 mg a day.   CONSULTATIONS:  None.   PROCEDURES:  The patient had an upper extremity Doppler, which revealed a  deep venous thrombosis of the right upper extremity.   HISTORY OF PRESENT ILLNESS:  The patient is a 30 year old African-American  female with a history of MS, avascular necrosis of the hips, hypertension,  and recent right hip arthroplasty status post revision in January 2005 with  MRSA cultured from the right hip. She had received six weeks of IV  Vancomycin via PICC line. She presented to the acute care clinic with right  arm  pain that began a couple of days before the PICC line was removed. The  patient denied any chest pain or dyspnea.   PAST MEDICAL HISTORY:  1. Multiple sclerosis.  2. 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  3. MRSA infection of the right hip.  4. Hypertension.  5. Microalbuminuria.  6. Anemia.   HOME MEDICATIONS:  As above.   SOCIAL HISTORY:  The patient is single with a 52-year-old son. She is on  disability for her multiple sclerosis. She has never smoked cigarettes and  denies any alcohol, cocaine, or IV drug use.  FAMILY HISTORY:  Mother is living and healthy. Father is also living and  healthy. She has one brother and one son who have no known health problems.   REVIEW OF SYSTEMS:  As per the HPI. The patient did report muscle pain and  back pain.   PHYSICAL EXAMINATION:  VITAL SIGNS:  Temperature 98.8, pulse 98, blood  pressure 139/89.  GENERAL:  The patient was alert and oriented x4, upset about being  hospitalized. In no acute distress.  HEENT:  Eyes are nonicteric, equal, and reactive to light. ENT:  Moist  mucous membranes. Oropharynx clear.  NECK:  Supple without any JVD or carotid bruit.  RESPIRATORY:  Clear with good air movement.  CARDIOVASCULAR:  Regular rate and rhythm.  ABDOMEN:  Soft. Positive bowel sounds in all four quadrants.  EXTREMITIES:  Right upper extremity with a moderate amount of edema and  extreme tenderness. Lower extremities were without any edema or tenderness  as was the left upper extremity.  SKIN:  Moist and warm.  MUSCULOSKELETAL:  The patient walks with a limp.  NEUROLOGIC:  Nonfocal.  PSYCHIATRIC:  With a depressed mood, cool affect, and linear thought  process.   LABORATORY DATA:  Labs on admission include an INR of 1.0, PT 12.9, PTT 28.  A CBC was with a white blood cell count of 4.7, hemoglobin 11.5, and a  platelet count of 247,000. MCV was 91.4. A BMET was with a sodium of 138,  potassium 2.4,  chloride 104, CO2 25, BUN 12, creatinine 0.8, glucose 93.   HOSPITAL COURSE:  The patient was admitted to a regular bed and started on  Coumadin and Lovenox. CVTS was consulted. They advised that thrombolysis was  not indicated in this patient. Dr. Alexandria Lodge was also contacted. He recommended  six months of outpatient Coumadin therapy. The patient was continued on her  antibiotics. Urine pregnancy test was negative. Anemia was thought to be  secondary to menstruation. Hypertension was reasonably well-controlled in  the hospital. The patient was stable during the hospitalization. No sign or  symptoms of pulmonary embolism. She is being discharged to home with a  follow-up appointment with Dr. Alexandria Lodge in the outpatient clinic on Monday,  March 14 at 9 a.m.   DISCHARGE LABS:  An INR of 1.1, PT 14.0, white blood cell count of 3.9,  hemoglobin 11.5, and a platelet count of 275,000. BMET is a sodium of 137,  potassium 3.4, chloride 106, bicarb 23, BUN 6, creatinine 0.8, glucose 112.      Manning Charity, MD                        Ileana Roup, M.D.    KK/MEDQ  D:  05/16/2003  T:  05/16/2003  Job:  [id number]   cc:   Dr. Alexandria Lodge

## 2010-07-23 NOTE — Discharge Summary (Signed)
NAME:  Dawn Peters, Dawn Peters                       ACCOUNT NO.:  [id number]   MEDICAL RECORD NO.:  [id number]                   PATIENT TYPE:  INP   LOCATION:  0482                                 FACILITY:  [facility]   PHYSICIAN:  Madlyn Frankel. Charlann Boxer, M.D.               DATE OF BIRTH:  08-23-1980   DATE OF ADMISSION:  03/28/2003  DATE OF DISCHARGE:                                 DISCHARGE SUMMARY   ADDENDUM:   DISCHARGE MEDICATIONS:  1. Coumadin per pharmacy protocol.     Clarene Reamer, P.A.-C.                   Madlyn Frankel Charlann Boxer, M.D.    SW/MEDQ  D:  04/02/2003  T:  04/02/2003  Job:  [id number]

## 2010-07-23 NOTE — Discharge Summary (Signed)
NAME:  Dawn Peters, Dawn Peters                       ACCOUNT NO.:  [id number]   MEDICAL RECORD NO.:  [id number]                   PATIENT TYPE:  INP   LOCATION:  0477                                 FACILITY:  [facility]   PHYSICIAN:  Durene Romans, M.D.                  DATE OF BIRTH:  [birth year]   DATE OF ADMISSION:  11/05/2002  DATE OF DISCHARGE:  11/08/2002                                 DISCHARGE SUMMARY   ADMITTING DIAGNOSES:  1. Bilateral aseptic necrosis, status post right total hip replacement     arthroplasty at [facility] by Dr. Wynona Canes.  2. Multiple sclerosis.   DISCHARGE DIAGNOSIS:  1. Bilateral aseptic necrosis, status post right total hip replacement     arthroplasty at [facility] by Dr. Wynona Canes.  2. Multiple sclerosis.  3. Mild postoperative anemia.  4. Pneumonitis by CT scan.  5. Mild hypokalemia, treated.   OPERATION:  On November 05, 2002, the patient underwent left total hip  replacement arthroplasty utilizing [employer] accolade system with components  press fitted.  Dr. Ronnell Guadalajara assisted.   BRIEF HISTORY:  This 30 year old lady with a history of multiple sclerosis  has developed bilateral hip avascular necrosis.  She underwent a right total  hip replacement arthroplasty by Dr. Wynona Canes at [facility] in the  not too distant past.  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  She has shortening  on the left compared to the right, and has a noticeable gait pattern  disturbance.  She also has had a considerable amount of pain and discomfort  into the left hip area.  She is a young lady who is highly desirous of  living a more normal life despite her multiple sclerosis, and after much  discussion it was decided she would benefit from total hip replacement  arthroplasty of the left hip and she was scheduled for same.   HOSPITAL COURSE:  The patient tolerated the surgical  procedure quite well.  She was placed on IV analgesics as well as IV antibiotics postoperatively,  and started on Coumadin protocol for prevention of DVT.  PAS hose were used  as well as TED hose to the lower extremities.  We encouraged ankle pumps as  well. The Hemovac (1) was removed on the first postoperative day without  complication.  The next day dressing was changed.  The Steri-Strips which  had been used over the subcuticular suture closure were intact.  Neurovascular remained intact to the left lower extremity.  On the second  and third postop days, the patient had an elevation in her temperature.  She  had no respiratory complaints; however, due to the historical probable  incidence of pulmonary embolus postop, a chest CT was ordered, and  fortunately it was negative for pulmonary embolism.  Small subtle  infiltrates were seen in  the right upper lobe which was thought to be a mild  pneumonitis.  The patient had been using incentive spirometer at bedside and  used it faithfully.  She was placed on hand-held nebulizer with albuterol to  assist with upper airway respiration, and she responded to this with a drop  in her temperature.  Final temperature the next 12 hours was normal.  Final  temperature was 97.3.   The patient was ambulating 50+ feet in the hall, utilizing a rolling walker  with some assistance.  She has a strong support family unit and she was  desirous to go home with her family.   Home health had been arranged through [facility], and durable medical equipment  was supplied to the patient.   On the day of discharge, the patient's vital signs were stable.  Hemoglobin  was 9.8, with hematocrit of 27.8.  She was asymptomatic as far as that was  concerned.  It was felt that she could be maintained in home environment,  and arrangements made for discharge.   LABORATORY DATA:  Hematology:  A CBC on admission was completely within  normal limits.  Hemoglobin was 13,  hematocrit was 37.9.  Blood chemistries  preoperatively were normal, and her potassium postoperatively dropped to 3.4  (normal 3.5), and then to 3.3.  Urinalysis was negative preoperatively, and  when repeated showed some trace of leukocytes.  Otherwise, completely  negative for urinary tract infection.  No preoperative chest x-ray was done.  Postoperative single view of the left hip showed interval left total hip  arthroplasty without complication.  Again, the CT of the lungs showed  negative for pulmonary embolism, small subtle infiltration of the right  upper lobe which may be due to pneumonitis; this was read by Dr. Janeece Riggers.  No electrocardiogram on this chart.   CONDITION ON DISCHARGE:  Improved, stable.   PLAN:  The patient is discharged to her home in the care of her family.  She  will receive home health per [place].  She is to continue with her home  medications and diet.  I recommend she follow up with her family physician  who is with the [facility] at [facility].  She is given a  prescription for Vicodin #50 with a refill one to two q.4-6h. p.r.n. pain,  Robaxin 500 mg #30 with a refill one q.6h. p.r.n. muscle spasm, Trinsicon  #60 one b.i.d., and Coumadin protocol per pharmacy.  She is to use her  Tylenol for any temp over 100.  She will continue with incentive spirometer  at home, continue with her hip precautions, as well as 25% partial  weightbearing to the left lower extremity.  She may use ice to the hip  p.r.n.  Continue with her TED hose to the lower extremities.  Call if any  problems.  Follow up to see Dr. Cornelius Moras in approximately two weeks.     Dooley L. Cherlynn June.                 Durene Romans, M.D.    DLU/MEDQ  D:  11/08/2002  T:  11/08/2002  Job:  [id number]   cc:   [facility]

## 2010-07-23 NOTE — Op Note (Signed)
NAME:  KAMAYA, KECKLER                       ACCOUNT NO.:  [id number]   MEDICAL RECORD NO.:  [id number]                   PATIENT TYPE:  INP   LOCATION:  0479                                 FACILITY:  [facility]   PHYSICIAN:  Durene Romans                        DATE OF BIRTH:  1980/06/03   DATE OF PROCEDURE:  DATE OF DISCHARGE:                                 OPERATIVE REPORT   PREOPERATIVE DIAGNOSIS:  Left hip avascular necrosis.   POSTOPERATIVE DIAGNOSIS:  Left hip avascular necrosis.   PROCEDURE:  Left total hip replacement.   COMPONENTS:  [employer] Accolade TMGF 132 degree HA size 4 femoral  stem, a 50 mm Trident PSL cup, 32 mm ceramic liner, and a 32 +0 ceramic  head.   SURGEON:  Carola Rhine, M.D.   ANESTHESIA:  General.   ESTIMATED BLOOD LOSS:  500.   FLUIDS REPLACED:  2500 mL lactated Ringer's.   DRAINS:  One.   COMPLICATIONS:  None.   DISPOSITION:  Extubated, transferred to recovery room in stable condition.   INDICATIONS:  Ms. Barbar is a 30 year old female with a history of multiple  sclerosis complicated by bilateral hip AVN.  She is status post a right  total hip arthroplasty on the right side performed at [facility].  She had  progressive collapse of both her femoral heads leading to total hip  arthroplasty versus salvage-type procedure.  Based on preoperative  evaluation clinically and radiographically, her right leg was about 5-10 mm  longer than the left.  This was utilized for the intraoperative neck  osteotomy decision on the left.   After discussing risks and benefits and reviewing these with the patient for  her left total hip replacement, she consented for left total hip  replacement.   PROCEDURE IN DETAIL:  The patient was brought to the operative theatre.  Once adequate anesthesia and preoperative antibiotics, 1 g of Ancef, were  given, the patient was positioned in the right lateral decubitus position  with the left side up.  The  left lower extremity was then prepped and draped  in sterile fashion.  Approximately a four-inch laterally-based incision was  made for a posterior approach to the hip.  Sharp dissection was carried  through the iliotibial band and gluteus maximus fascia.  Short external  rotators were taken down and separated from the capsule.  The posterior  capsulotomy was then created and tagged for later repair.  The hip was  dislocated.  The neck osteotomy was then made based off of the preoperative  templates and anatomic landmarks.  Following neck osteotomy, attention was  directed to the acetabulum.  Following labrectomy and debridement of  pulvinar tissue, reaming commenced with a 44 reamer and was carried up to a  50 mm reamer.  Reaming to a 50 mm reamer allowed [place] to place a 50 mm PSL  Trident cup, which would accept a 32 mm ceramic liner.  The acetabular shell  was placed, followed by placement of the liner.  The position of the cup was  about 35-40 degrees of abduction and 20 degrees of forward flexion.  A trial  liner was placed and attention was directed to the femur.   The femur was prepared per protocol for the Accolade with starting axial  reamer by hand, followed by broaching beginning with a 1 and carried up to a  size 4.  Approximately 15 degrees of anteversion was placed into the femoral  stem, which was near her anatomic position of her medial neck.  Trial  reduction was carried out.  The hip was stable throughout a range of motion  with hip flexion to 90 and internal rotation to 80 degrees plus.  There was  no evidence of impingement with external rotation of at least 30 degrees.  The combined anteversion was around 40 degrees.  There was no evidence of  impingement with side abduction of about 40 degrees.  Following the trial  reduction the hip was dislocated, the trial components were removed.  Two  cancellous screws were placed into the ilium.  The final 32 ceramic liner  was  then impacted in position and checked for stability.  At this point  attention was redirected to the femur.  The final broach was removed and the  canal prepared for the final size 4 Accolade TMGF 132 degree stem.  The stem  was then impacted to the level of the final broach and stable.  The final 32-  0 ceramic head was then impacted onto a clean and dried trunnion and the hip  reduced.  The hip was copiously irrigated with normal saline solution.  The  posterior capsule was reapproximated to the posterior trochanter through  drill holes.  A single drain was placed in the deep tissue.  The piriformis  was then reapproximated to the gluteus medius tendon at its insertion.  At  this point the hip was again irrigated.  The iliotibial band and gluteus  maximus fascia were reapproximated using #1 Vicryl.  Two deep layer  subcutaneous fat stitches were placed, followed by 2-0 Vicryl subcutaneously  and a running 4-0 Monocryl in the skin.  The hip was cleaned, dried, and  dressed sterilely with Steri-Strips, dressing sponges, and tape.  At this  point the patient was extubated, noted to be dorsiflexing and everting her  foot and wiggling her toes.  She was extubated and transferred to the  recovery room in stable condition.                                               Durene Romans    MO/MEDQ  D:  11/05/2002  T:  11/05/2002  Job:  [id number]

## 2010-07-23 NOTE — H&P (Signed)
NAME:  Dawn Peters, Dawn Peters                       ACCOUNT NO.:  [id number]   MEDICAL RECORD NO.:  [id number]                   PATIENT TYPE:  INP   LOCATION:  NA                                   FACILITY:  [facility]   PHYSICIAN:  Madlyn Frankel. Charlann Boxer, M.D.               DATE OF BIRTH:  October 12, 1980   DATE OF ADMISSION:  03/28/2003  DATE OF DISCHARGE:                                HISTORY & PHYSICAL   CHIEF COMPLAINT:  Right hip pain.   HISTORY OF PRESENT ILLNESS:  The patient is a 30 year old female who is  status post bilateral total hip replacements, the right one back in June  2004, done at [facility].  She is also status post about three months of left  total hip replacement done by Dr. Charlann Boxer and Dr. Montez Morita.  She is now  complaining with an increased amount of right groin pain, particularly when  standing.  The right groin pain has not subsided during Dr. Nilsa Nutting  evaluation.  She has noticed that the pain is worse with getting up from a  sitting position and initially bears weight on it.  She does, however, deny  pain with normal range of motion, but with first weightbearing activity  produces a groin pain.  There are no mechanical symptoms associated with  this.  Radiographic review reveals that there appears to be a lucency around  the acetabular shell indicating that there may not be in growth in the  acetabular shell.  With this, Dr. Charlann Boxer feels it is best to proceed with a  revision of the acetabular component of the femoral head.  The patient  agrees.  Risks and benefits of the surgery have been discussed with the  patient and the patient wishes to proceed.   PAST MEDICAL HISTORY:  1. Multiple sclerosis.  2. Avascular necrosis of bilateral hips.  3. Hypertension.   PAST SURGICAL HISTORY:  Bilateral total hip arthroplasties.   MEDICATIONS:  1. Betaseron 1 ml subcu every other day.  2. Baclofen 20 mg one p.o. q.i.d.  3. Neurontin 600 mg one p.o. t.i.d.  4. Aventurine one every three  months.  The next one is due this next month,     however, she has not taken it due to the surgery.  5. Lisinopril 10 mg one p.o. daily.  6. Vicodin p.r.n.   ALLERGIES:  No known drug allergies.   SOCIAL HISTORY:  The patient is single.  She denies any tobacco or alcohol  use.  She lives in a one story house with five steps entering her house, and  her mother will be her caregiver after surgery.   FAMILY HISTORY:  Grandmother with diabetes mellitus and hypertension.  Grandfather with diabetes mellitus.   REVIEW OF SYSTEMS:  GENERAL:  Denies fevers, chills, night sweats, bleeding  tendencies.  CNS:  Denies blurry or double vision, seizures, headaches,  paralysis.  RESPIRATORY:  Denies shortness of  breath, productive cough,  hemoptysis.  CARDIOVASCULAR:  Denies chest pain, angina, orthopnea.  GASTROINTESTINAL:  Denies nausea, vomiting, diarrhea, constipation, melena,  bloody stools.  GENITOURINARY:  Denies dysuria, hematuria, or discharge.  MUSCULOSKELETAL:  Pertinent as HPI.   PHYSICAL EXAMINATION:  VITAL SIGNS:  Blood pressure 122/83, pulse 84,  respirations 12.  GENERAL:  A well-developed, well-nourished 30 year old white female.  HEENT:  Normocephalic, atraumatic.  Pupils equal, round, reactive to light.  NECK:  Supple, no carotid bruits noted.  CHEST:  Clear to auscultation bilaterally.  No wheezes or crackles.  HEART:  Regular rate and rhythm, no murmurs, rubs, or gallops.  ABDOMEN:  Soft, nontender, nondistended, positive bowel sounds x4.  EXTREMITIES:  She has good range of motion of the right hip, however, has an  increased amount of pain with initial weightbearing which pain radiates to  the groin.  SKIN:  No rashes or lesions.   LABORATORY DATA:  X-ray reveals lucency around acetabular shell which could  be a production of noting in-growth into the acetabular cup.   IMPRESSION:  1. Failed right total hip arthroplasty.  2. Multiple sclerosis.  3. Avascular necrosis of  bilateral hips.  4. Hypertension.   PLAN:  The patient will be admitted to [facility] on March 28, 2003, and undergo a revision of the right total hip arthroplasty by Dr.  Durene Romans.     Clarene Reamer, P.A.-C.                   Madlyn Frankel Charlann Boxer, M.D.    SW/MEDQ  D:  03/27/2003  T:  03/27/2003  Job:  [id number]

## 2010-07-23 NOTE — Group Therapy Note (Signed)
NAME:  ALYRICA, THUROW NO.:  [id number]   MEDICAL RECORD NO.:  [id number]          PATIENT TYPE:  WOC   LOCATION:  [facility]                   FACILITY:  [facility]   PHYSICIAN:  Argentina Donovan, MD        DATE OF BIRTH:  07-24-1980   DATE OF SERVICE:  06/21/2006                                  CLINIC NOTE   HISTORY:  The patient is a 30 year old African-American with multiple  sclerosis and on several medications complaining of vaginal discharge  and urinary frequency.  Pap smear in February was normal.  The patient  is a gravida 1, para 1-0-0-1 and has had multiple episodes of bacterial  vaginosis, so wet prep and DNA probe for chlamydia and gonorrhea were  taken today.   EXAMINATION:  The external genitalia is normal with exception of a 1.5  cm cystic lesion just inside the labia minora on the right side at the  lower pole.  The vagina was clean and well-rugated with very little  discharge.  The cervix is clean, parous.  The uterus is anterior, normal  size, shape, consistency and the adnexa is normal.   IMPRESSION:  Urinary frequency and vaginal discharge, diagnosis pending  laboratory results.  If the patient has a positive bacterial vaginosis,  we will order Flagyl with renewals as this is a chronic thing with her.           ______________________________  Argentina Donovan, MD     PR/MEDQ  D:  06/21/2006  T:  06/21/2006  Job:  [id number]

## 2010-07-23 NOTE — H&P (Signed)
NAME:  Dawn Peters, Dawn [name]:  [id number]   MEDICAL RECORD NO.:  [id number]                    PATIENT TYPE:   LOCATION:                                       FACILITY:  [facility]   PHYSICIAN:  Shelda Pal, M.D.              DATE OF BIRTH:   DATE OF ADMISSION:  11/05/2002  DATE OF DISCHARGE:                                HISTORY & PHYSICAL   CHIEF COMPLAINT:  Pain in my left hip.   HISTORY OF PRESENT ILLNESS:  The patient is a 30 year old black female with  a history of bilateral avascular necrosis to her hips secondary to steroid  treatment for multiple sclerosis.  She has previously undergone a right  total hip replacement in June 2004, by Dr. Edwyna Ready at [facility].  She had no  postoperative complications after the previous total hip.  She now states  that she is having a lot of pain in her left hip, and due to scheduling at  [facility], she wishes to go ahead and have the operation done.  Dr. Charlann Boxer and Dr.  Montez Morita feel it is best to go ahead and proceed with surgery.  The patient  agrees.  The risks and benefits of the surgery have been discussed with the  patient, the patient wishes to proceed.   PAST MEDICAL HISTORY:  1. Multiple sclerosis.  2. Avascular necrosis of bilateral hips.   PAST SURGICAL HISTORY:  Right total hip arthroplasty.   MEDICATIONS:  1. Betaseron 1 mL subcu every other day.  2. Baclofen 20 mg, one p.o. q.i.d.  3. Neurontin 600 mg, one p.o. t.i.d.  4. Novantrone one q.3 months, the next one will be due in October.  5. Darvocet-N 100, one p.o. q.4-6 h. p.r.n. pain.   ALLERGIES:  No known drug allergies.   SOCIAL HISTORY:  The patient is single.  She lives in a one story house with  no steps entering the house.  Her mother will be her caregiver after  surgery.  The patient denies any tobacco or alcohol use.   FAMILY HISTORY:  Grandmother with diabetes mellitus and hypertension.  Grandfather with diabetes mellitus.   REVIEW OF  SYSTEMS:  GENERAL:  Denies fevers, chills, night sweats, bleeding  tendencies.  CENTRAL NERVOUS SYSTEM:  Denies blurry or double vision,  seizures, headaches, paralysis.  RESPIRATORY:  Denies shortness of breath,  productive cough, hemoptysis.  CARDIOVASCULAR:  Denies chest pain, angina,  orthopnea.  GASTROINTESTINAL:  Positive constipation.  Denies nausea,  vomiting, diarrhea, melena, bloody stools.  GENITOURINARY:  Denies dysuria,  hematuria, discharge.  MUSCULOSKELETAL:  Pertinent to HPI.   PHYSICAL EXAMINATION:  VITAL SIGNS:  Blood pressure 120/70, pulse 80,  respirations 12.  GENERAL:  The patient is a well-developed, well-nourished 30 year old  pleasant female.  HEENT:  Normocephalic, atraumatic.  Pupils equal, round, reactive to light.  NECK:  Supple, no carotid bruit noted.  CHEST:  Clear to auscultation bilaterally.  No wheezes or crackles.  HEART:  Regular rate and rhythm.  No murmurs, rubs, or gallops.  ABDOMEN:  Soft, nontender, nondistended, positive bowel sounds x4.  EXTREMITIES:  She has pain on external rotation and abduction of her left  hip.  She has good range of motion, however.  She is neurovascularly intact  distally.  SKIN:  No rashes or lesions.   DIAGNOSTIC STUDIES:  MRI reveals bilateral aseptic necrosis.   IMPRESSION:  1. Bilateral aseptic necrosis, status post right total hip replacement.  2. Multiple sclerosis.   PLAN:  The patient will be admitted to [facility] and undergo a  left total hip arthroplasty by Dr. Durene Romans on November 05, 2002.     Clarene Reamer, P.A.-C.                   Shelda Pal, M.D.    SW/MEDQ  D:  10/29/2002  T:  10/29/2002  Job:  [id number]   cc:   [facility]

## 2010-07-23 NOTE — Group Therapy Note (Signed)
NAME:  EMMAROSE, KLINKE NO.:  [id number]   MEDICAL RECORD NO.:  [id number]          PATIENT TYPE:  WOC   LOCATION:  [facility]                   FACILITY:  [facility]   PHYSICIAN:  Kathlyn Sacramento, M.D.   DATE OF BIRTH:  1981/01/29   DATE OF SERVICE:                                    CLINIC NOTE   CHIEF COMPLAINT:  Vaginal discharge and odor.   HISTORY OF PRESENT ILLNESS:  The patient is a 30 year old African-American  female, who states that she has two to three weeks of some white vaginal  discharge.  She said it did clear some with Monistat, but it returned and  said is now malodorous.   ALLERGIES:  NO KNOWN DRUG ALLERGIES.   Last Pap smear February 01, 2005.   CURRENT MEDICATIONS:  Lisinopril, __________, Neurontin and baclofen.   PAST MEDICAL HISTORY:  Multiple sclerosis.   PHYSICAL EXAMINATION:  VITAL SIGNS: Temp 98.6, pulse 77, blood pressure  142/89, weight 125.8 and height 5 feet 5 inches.  GENITOURINARY EXAM:  Normal external genitalia.  The vagina is pink and  rugated.  Cervix normal in appearance, a positive small amount of white  discharge.   IMPRESSION:  Vaginitis.   PLAN:  GC, Chlamydia and wet prep were done.  If positive for infection, we  will call the patient with results.           ______________________________  Kathlyn Sacramento, M.D.     AC/MEDQ  D:  04/12/2005  T:  04/12/2005  Job:  [id number]

## 2010-07-23 NOTE — Group Therapy Note (Signed)
NAME:  Dawn Peters, Dawn [name]:  [id number]   MEDICAL RECORD NO.:  [id number]          PATIENT TYPE:  WOC   LOCATION:  [facility]                   FACILITY:  [facility]   PHYSICIAN:  Elsie Lincoln, MD      DATE OF BIRTH:  20-May-1980   DATE OF SERVICE:  06/29/2005                                    CLINIC NOTE   This is a 30 year old female who presents for follow up Pap smear.  She at  one time had ascus with positive high-risk HPD a set of one __________.  She  had a repeat Pap smear six months later that was ascus with slight atypia  and negative high-risk HPD.  Today, she returns for a repeat Pap smear.  Most likely, this will be negative.  This, of note, she does have multiple  sclerosis.  She is on birth control pills and is doing well.  She has been  occasionally sexually active and has not been monogamous.  She said she  would like GC/Chlamydia today.  She has had a recent HIV negative test when  she applied for life insurance, so she declines that today.  Patient, in  general, doing well.   PHYSICAL EXAMINATION:  GENITALIA:  Tanner 5.  Vagina pink.  Normal rugae.  Cervix closed, nontender.   ASSESSMENT/PLAN:  A 30 year old female for repeat Pap smear which was done.  GC/Chlamydia done.  Also, the patient states that she has had some weird  bleeding this month.  To date, UPT as negative.  Most likely, the patient is  having a light period due to being very thin and being on OCP's.  Patient  reassured.  Come back in a year for a Pap smear.           ______________________________  Elsie Lincoln, MD     KL/MEDQ  D:  06/29/2005  T:  06/30/2005  Job:  [id number]

## 2010-07-23 NOTE — Group Therapy Note (Signed)
NAME:  CLAUDE, SWENDSEN NO.:  [id number]   MEDICAL RECORD NO.:  [id number]          PATIENT TYPE:  WOC   LOCATION:  [facility]                   FACILITY:  [facility]   PHYSICIAN:  Elsie Lincoln, MD      DATE OF BIRTH:  Oct 30, 1980   DATE OF SERVICE:  03/30/2004                                    CLINIC NOTE   Patient is a 30 year old female who presents for complaints of vaginal  itching and discharge.  See previous clinic note for GYN examination.  Patient says she oozing regularly and she has been having this watery  discharge for several days.  She also had frequent urination and was seen in  the [facility] last Friday. She had negative UCG and negative UA.  She is no longer  having polyuria.  She believes she is in a monogamous relationship.  She  refuses retesting for HIV and hepatitis and syphilis today.   PHYSICAL EXAMINATION:  There is area of excoriation at 6 o'clock in the  introitus and scant amount of discharge in the vault.  Vault appears  slightly inflamed.  Cervix closed, nontender.  Wet prep shows almost no  cellularity.   ASSESSMENT/PLAN:  A 30 year old female with questionable yeast infection.  1.  Will treat with Diflucan 150 mg p.o. x1.  2.  Patient told to stop douching to reestablish normal flora.  3.  Return to clinic in three weeks to discuss placement Mirena IUD instead      of the progesterone only pills.  She is not a candidate for estrogen due      to a clot she has had.  She is also not a candidate for Depo secondary      to her osteoporosis.      KL/MEDQ  D:  03/30/2004  T:  03/31/2004  Job:  [id number]

## 2010-07-23 NOTE — Group Therapy Note (Signed)
NAME:  Dawn Peters, Dawn Peters                       ACCOUNT NO.:  [id number]   MEDICAL RECORD NO.:  [id number]                   PATIENT TYPE:  OUT   LOCATION:  [facility]                           FACILITY:  [facility]   PHYSICIAN:  Elsie Lincoln, MD                   DATE OF BIRTH:  [birth year]   DATE OF SERVICE:  06/03/2003                                    CLINIC NOTE   REASON FOR VISIT:  The patient is a 30 year old female with multiple medical  problems including MS, osteoporosis from chronic steroid use, high blood  pressure, and some sort of kidney damage that the patient does not  understand why she has.  The patient presents for GYN exam.  She denies  being sexually active at this time.  She has been on Depo-Provera for 7  years; however, after getting osteoporosis from her chronic steroid use we  will discontinue the Depo-Provera as it can inhibit bone rebuilding.  The  patient has no GYN complaints today.   PAST MEDICAL HISTORY:  The patient had bilateral hip replacements and got a  MRSA infection.  During her stay at the hospital she required a PICC line  and chronic IV vancomycin.  She also then got a clot in her arm from the  PICC line and was told she could never take estrogen-containing birth  control products.   PHYSICAL EXAMINATION:  VITAL SIGNS:  Pulse 79, blood pressure 151/104,  weight 113.  BREASTS:  No masses, nontender.  No skin changes.  ABDOMEN:  Soft, nontender, nondistended.  PELVIC:  External genitalia Tanner V.  Vagina:  No blood, no lesions, no  abnormal discharge.  Cervix:  Closed, nontender.  Uterus:  Retroverted,  nontender.  Adnexa:  No masses, nontender.  Ovaries are palpated easily.   ASSESSMENT AND PLAN:  A 30 year old female for gynecological examination.   1. Pap and cultures done.  2. Progesterone only pills given.  The patient understands to take the same     time every day or they will not work.  The patient also reminded to use     condoms as  back-up method and to prevent STDs.  3. If she does not like these pills she can come back for Mirena IUD she     refuses earlier today.  4. The patient is to follow up at [facility] to evaluate her     blood pressure.  5. Return to clinic in 1 year.                                               Elsie Lincoln, MD    KL/MEDQ  D:  06/03/2003  T:  06/03/2003  Job:  [id number]

## 2010-07-23 NOTE — Discharge Summary (Signed)
NAME:  Dawn Peters, Dawn Peters                       ACCOUNT NO.:  [id number]   MEDICAL RECORD NO.:  [id number]                   PATIENT TYPE:  INP   LOCATION:  0482                                 FACILITY:  [facility]   PHYSICIAN:  Madlyn Frankel. Charlann Boxer, M.D.               DATE OF BIRTH:  08-29-1980   DATE OF ADMISSION:  03/28/2003  DATE OF DISCHARGE:  04/02/2003                                 DISCHARGE SUMMARY   ADMISSION DIAGNOSES:  1. Failed right total hip arthroplasty.  2. Multiple sclerosis.  3. Avascular necrosis of bilateral hips.  4. Hypertension.   DISCHARGE DIAGNOSES:  1. Failed right total hip arthroplasty, status post right total hip     revision.  2. Multiple sclerosis.  3. Avascular necrosis of bilateral hips.  4. Hypertension.  5. Postoperative hemorrhagic anemia, stable at the time of discharge.   PROCEDURE:  The patient was taken to the operating room on March 28, 2003,  and underwent revision of the right acetabular with bone graft, by Dr.  Durene Romans. Assistant was [employer], P.A.-C. Surgery was done under  general anesthesia and a Hemovac drain times one was placed at the time of  surgery.   CONSULTATIONS:  Physical therapy, occupational therapy, social work, and  case management.   BRIEF HISTORY:  The patient is a 30 year old female, status post bilateral  total hip replacement, the right one in June 2004 done at [facility] ([place] She is also  status post about three months of left total hip replacement done by Dr.  Charlann Boxer and Dr. Montez Morita of [facility]. She is now complaining of an increasing amount  of right groin pain, particularly when standing. The right groin has not  subsided during Dr. Nilsa Nutting evaluation.  She has noticed that the pain is  worse with getting up from a seated position and when she initially bears  weight on it. She does, however, denies the pain with normal range of motion  but with first weightbearing activity produces the groin pain. There are no  mechanical symptoms associated with this.   Radiographic review revealed that there appeared to be a lucency around the  acetabular shell indicating that there may not be growth in the acetabular  shell. With this, Dr. Charlann Boxer felt it was best to proceed with revision of the  acetabular component and the femoral head. The patient was given the risks  and benefits of the surgery and discussed with the patient, and the patient  wishes to proceed.   LABORATORY DATA:  CBC on admission showed a hemoglobin of 13.7, hematocrit  41.0, white blood cell count 2.5, red blood cell count 4.32. Serial H&H was  followed throughout the hospital stay.  Hemoglobin and hematocrit did  decline to 10.4 and 30.6 on April 01, 2003; however were stable at the  time of discharge. Differential on admission showed monocytes high at 18.  Followup differential on March 28, 2003, was  within normal limits, and  repeat differential on March 31, 2003, showed neutrophils high at 85,  lymphocytes low at 7.  Coagulation studies on admission were all within  normal limits.  PT-INR at the time of discharge was 23.6 and 2.9  respectively, and on Coumadin therapy. Routine chemistry on admission showed  potassium low at 3.1, glucose low at 65. Serial chemistries were followed  throughout hospital stay. The potassium rose to 2.6 on March 30, 2003,  however dropped back down to 3.4 on March 31, 2003, but was stable at the  time of discharge. Her glucose ranged from high of 126 on March 29, 2003,  to a low at 79 on March 28, 2003.  Urinalysis on admission showed a cloudy  appearance, a small amount of hemoglobin and a small amount of leukocyte  esterase.  Follow-up urinalysis on March 28, 2003, showed cloudy  appearance with 15 mg/dl of ketones. Repeat urinalysis on March 31, 2003,  was all within normal limits.  The patient's blood type was B positive with  antibody screen positive.  Tissue culture from the right  hip taken on  March 28, 2003, revealed rare methicillin-resistant Staphylococcus aureus.  Gram-stains taken on that same day revealed rare white blood cells present,  predominately PMNs, but no organisms.  Sensitivities revealed the MRSA was  sensitive to vancomycin and rifampin, and anaerobic cultures on March 28, 2003, revealed no anaerobes isolated. Culture still in progress at the time  of this dictation.  There is no EKG on this chart.   RADIOGRAPHS:  Preoperative x-rays reveal slight lucency surrounding the  right acetabular prosthesis with slight vertical orientation relative to  that on the left.  Postoperative x-rays on March 28, 2003, revealed  expected postoperative appearance of the right hip prosthesis and no  evidence of fracture, dislocation.   HOSPITAL COURSE:  The patient was admitted to the [facility] and  underwent the above-stated procedure. The patient tolerated the procedure  well and returned to the recovery room and then to orthopedic floor for  continued postoperative care. On postoperative day #1, the patient was done  well, was afebrile, and dressings were clean, dry, and intact.  She was  neurovascularly intact distally. Hemoglobin and hematocrit were 11.2 and  33.5.  Hemovac was discontinued on the same. The patient continued working  with physical therapy. On March 30, 2003, postoperative day #2, the  patient was doing okay, pain was improving. Hemoglobin and hematocrit were  11.0 and 32.5, T-max 100.0. She was neurovascularly intact in the right  lower extremity.  Incision clean, dry, and intact. PCA was discontinued on  this day and the patient continued working with physical therapy. On  postoperative day #3, the patient was resting comfortably, no complaints. T-  max was 102, heart rate was 123, neurovascularly intact to the right lower  extremity. The incision was clean, dry, and intact. Hemoglobin and hematocrit were 10.5 and 30.8.  The patient was continue working with  physical therapy and occupational therapy with a plan for discharge the  following day. On April 01, 2003, the patient complained of migraine and  nausea. A PICC line was placed on March 31, 2003, on the previous day due  to return of the cultures revealing methicillin-resistant Staphylococcus  aureus. On April 01, 2003, the patient was doing well despite migraine and  nausea. She was neurovascularly intact to the right lower extremity.  Hemoglobin and hematocrit were 10.4 and 30.6.  Incision was  clean, dry, and  intact. The patient continued to work with physical therapy and occupational  therapy. She was to be set up with home health nurse for intravenous  vancomycin for six weeks. Plan is to be discharged on the following day. On  April 02, 2003, postoperative day #5, the patient was resting comfortably  and ready for discharge, afebrile, neurovascularly intact in the right lower  extremity. Incision was clean, dry, and intact. The plan is to be discharged  home.   DISPOSITION:  The patient discharged home on April 02, 2003.   DISCHARGE MEDICATIONS:  1. Vicodin, #50, one to two p.o. q.6h. p.r.n. pain.  2. Robaxin 500 mg, #50, one p.o. q.6-8h. p.r.n.  3. Vancomycin 1 gm q.12h. via PICC line times six weeks.  4. Rifampin 300 mg one p.o. daily, #30, with a refill.   DIET:  As tolerated.   ACTIVITY:  The patient has total hip precautions, [facility] for home care. She  is touchdown weightbearing to the right lower extremity.   WOUND CARE:  The patient is to perform daily dressing changes until no  drainage. She may shower when there is no drainage.   FOLLOWUP:  The patient is to follow up two weeks from the day of surgery  with Dr. Charlann Boxer, the office to be called for an  appointment.   CONDITION ON DISCHARGE:  Stable and improved.     Clarene Reamer, P.A.-C.                   Madlyn Frankel Charlann Boxer, M.D.    SW/MEDQ  D:  04/02/2003  T:   04/02/2003  Job:  [id number]

## 2010-10-07 ENCOUNTER — Inpatient Hospital Stay (INDEPENDENT_AMBULATORY_CARE_PROVIDER_SITE_OTHER)
Admission: RE | Admit: 2010-10-07 | Discharge: 2010-10-07 | Disposition: A | Discharge status: Home / Self Care | Payer: PRIVATE HEALTH INSURANCE | Source: Ambulatory Visit | Attending: Family Medicine | Admitting: Family Medicine

## 2010-10-07 DIAGNOSIS — R42 Dizziness and giddiness: Secondary | ICD-10-CM

## 2010-10-07 DIAGNOSIS — R51 Headache: Secondary | ICD-10-CM

## 2010-11-18 ENCOUNTER — Telehealth: Payer: Self-pay

## 2010-11-18 NOTE — Telephone Encounter (Signed)
Pt called and stated that she needed to have a Rx for a bacteria infection. Called pt and left message that we are returning her call and to return our call to the clinics.

## 2010-11-22 ENCOUNTER — Telehealth: Payer: Self-pay | Admitting: Obstetrics and Gynecology

## 2010-11-22 NOTE — Telephone Encounter (Signed)
Patient c/o odorous white discharge and itch on vulva area X1 week. Requesting Rx for bacterial vaginosis. [facility] (pharm on file).

## 2010-11-22 NOTE — Telephone Encounter (Signed)
Pt has spoken with Mickeal Needy LPN regarding her c/o- see separate tel encounter info 9/17.

## 2010-11-24 ENCOUNTER — Encounter: Payer: Self-pay | Admitting: Obstetrics and Gynecology

## 2010-11-29 NOTE — Telephone Encounter (Signed)
Left message on voicemail to follow-up on complaints stated on 11/22/10 (s/s of bacterial vaginosis). It didn't look like Dr. Okey Dupre have made any action on the routed message. Told patient on this message to call us back.

## 2010-11-30 LAB — WET PREP, GENITAL
Trich, Wet Prep: NONE SEEN
Yeast Wet Prep HPF POC: NONE SEEN

## 2010-12-01 LAB — URINALYSIS, ROUTINE W REFLEX MICROSCOPIC
Glucose, UA: NEGATIVE
Ketones, ur: NEGATIVE
Nitrite: NEGATIVE
Protein, ur: NEGATIVE
pH: 5.5

## 2010-12-01 LAB — POCT PREGNANCY, URINE: Preg Test, Ur: NEGATIVE

## 2010-12-10 LAB — POCT PREGNANCY, URINE: Preg Test, Ur: NEGATIVE

## 2010-12-14 LAB — WET PREP, GENITAL
Clue Cells Wet Prep HPF POC: NONE SEEN
Trich, Wet Prep: NONE SEEN

## 2010-12-15 LAB — POCT PREGNANCY, URINE
Operator id: 148111
Preg Test, Ur: NEGATIVE

## 2010-12-17 LAB — URINALYSIS, ROUTINE W REFLEX MICROSCOPIC
Bilirubin Urine: NEGATIVE
Ketones, ur: NEGATIVE
Nitrite: NEGATIVE
Protein, ur: NEGATIVE
Urobilinogen, UA: 0.2

## 2010-12-17 LAB — WET PREP, GENITAL
Clue Cells Wet Prep HPF POC: NONE SEEN
Trich, Wet Prep: NONE SEEN

## 2011-01-05 ENCOUNTER — Inpatient Hospital Stay (INDEPENDENT_AMBULATORY_CARE_PROVIDER_SITE_OTHER)
Admission: RE | Admit: 2011-01-05 | Discharge: 2011-01-05 | Disposition: A | Discharge status: Home / Self Care | Payer: PRIVATE HEALTH INSURANCE | Source: Ambulatory Visit | Attending: Emergency Medicine | Admitting: Emergency Medicine

## 2011-01-05 DIAGNOSIS — M549 Dorsalgia, unspecified: Secondary | ICD-10-CM

## 2011-08-02 ENCOUNTER — Emergency Department (INDEPENDENT_AMBULATORY_CARE_PROVIDER_SITE_OTHER): Payer: PRIVATE HEALTH INSURANCE

## 2011-08-02 ENCOUNTER — Encounter (HOSPITAL_COMMUNITY): Payer: Self-pay | Admitting: Emergency Medicine

## 2011-08-02 ENCOUNTER — Emergency Department (INDEPENDENT_AMBULATORY_CARE_PROVIDER_SITE_OTHER)
Admission: EM | Admit: 2011-08-02 | Discharge: 2011-08-02 | Disposition: A | Discharge status: Home / Self Care | Payer: PRIVATE HEALTH INSURANCE | Source: Home / Self Care | Attending: Emergency Medicine | Admitting: Emergency Medicine

## 2011-08-02 DIAGNOSIS — M25559 Pain in unspecified hip: Secondary | ICD-10-CM

## 2011-08-02 HISTORY — DX: Multiple sclerosis: G35

## 2011-08-02 MED ORDER — PREDNISONE (PAK) 10 MG PO TABS: 10.0000 mg | ORAL_TABLET | Freq: Every day | ORAL | Status: AC

## 2011-08-02 MED ORDER — HYDROCODONE-ACETAMINOPHEN 5-325 MG PO TABS: 2.0000 | ORAL_TABLET | ORAL | Status: AC | PRN

## 2011-08-02 NOTE — ED Notes (Signed)
Pt having sudden onset hip pain for 2 days. She states it is very painful and nothing helps. She denies an injury but states she has had bilateral hip replacements.

## 2011-08-02 NOTE — ED Provider Notes (Signed)
History     CSN: [id number]  Arrival date & time 08/02/11  0957   None     Chief Complaint  Patient presents with  . Hip Pain    (Consider location/radiation/quality/duration/timing/severity/associated sxs/prior treatment) HPI Comments: Pt has MS, has B hip replacements.  L hip occasionally causes her pain, has seen her ortho for this before, last time was several months ago.  Pain much worse since 5/25, in unable to flex left hip.  Denies injury  Patient is a 31 y.o. female presenting with hip pain. The history is provided by the patient.  Hip Pain This is a recurrent problem. The current episode started more than 2 days ago. The problem occurs constantly. The problem has not changed since onset.The symptoms are aggravated by exertion, walking and standing. The symptoms are relieved by nothing. Treatments tried: own medicines. The treatment provided no relief.    Past Medical History  Diagnosis Date  . MS (multiple sclerosis)     Past Surgical History  Procedure Date  . Bilateral hip arthroscopy     History reviewed. No pertinent family history.  History  Substance Use Topics  . Smoking status: Not on file  . Smokeless tobacco: Not on file  . Alcohol Use:     OB History    Grav Para Term Preterm Abortions TAB SAB Ect Mult Living                  Review of Systems  Constitutional: Negative for fever and chills.  Musculoskeletal:       Hip pain and weakness    Allergies  Review of patient's allergies indicates no known allergies.  Home Medications   Current Outpatient Rx  Name Route Sig Dispense Refill  . BACLOFEN 20 MG PO TABS Oral Take 20 mg by mouth 3 (three) times daily.    Marland Kitchen GABAPENTIN 800 MG PO TABS Oral Take 800 mg by mouth 3 (three) times daily.    Marland Kitchen LISINOPRIL 20 MG PO TABS Oral Take 20 mg by mouth daily.    Marland Kitchen NATALIZUMAB 300 MG/15ML IV CONC Intravenous Inject into the vein.    Marland Kitchen HYDROCODONE-ACETAMINOPHEN 5-325 MG PO TABS Oral Take 2 tablets by  mouth every 4 (four) hours as needed for pain. 10 tablet 0  . PREDNISONE (PAK) 10 MG PO TABS Oral Take 1 tablet (10 mg total) by mouth daily. Take 6 tabs on day one, 5 tabs day two, 4 tabs day three, 3 tabs day four, 2 tabs day five, 1 tab day six 21 tablet 0    BP 107/70  Pulse 90  Temp(Src) 98.2 F (36.8 C) (Oral)  Resp 16  SpO2 99%  LMP 07/30/2011  Physical Exam  Constitutional: She appears well-developed and well-nourished. No distress.  Musculoskeletal:       Right hip: She exhibits normal range of motion and no tenderness.       Left hip: She exhibits decreased range of motion and tenderness. She exhibits no swelling and no deformity.       Unable to flex hip past 15-20 degrees due to weakness; this motion is also painful.  Can hyperextend and abduct/adduct hip without difficulty.    Neurological: She exhibits normal muscle tone.       No sensory deficit in L hip area  Skin: Skin is warm and dry. No rash noted.    ED Course  Procedures (including critical care time)  Labs Reviewed - No data to display Dg Hip  Complete Left  08/02/2011  *RADIOLOGY REPORT*  Clinical Data: Left hip pain.  LEFT HIP - COMPLETE 2+ VIEW  Comparison: 12/16/2009  Findings: Stable changes of bilateral hip replacements.  No hardware or bony complicating feature.  No fracture, subluxation or dislocation.  IMPRESSION: Bilateral hip replacements.  No acute bony abnormality or complicating feature.  Original Report Authenticated By: Cyndie Chime, M.D.     1. Hip pain       MDM          Cathlyn Parsons, NP 08/02/11 [phone]

## 2011-08-02 NOTE — ED Provider Notes (Signed)
Medical screening examination/treatment/procedure(s) were performed by non-physician practitioner and as supervising physician I was immediately available for consultation/collaboration.  Raynald Blend, MD 08/02/11 [phone]

## 2011-08-02 NOTE — Discharge Instructions (Signed)
Talk with your MS doctor about the possibility of going to physical therapy.    Hip Pain The hips join the upper legs to the lower pelvis. The bones, cartilage, tendons, and muscles of the hip joint perform a lot of work each day holding your body weight and allowing you to move around. Hip pain is a common symptom. It can range from a minor ache to severe pain on 1 or both hips. Pain may be felt on the inside of the hip joint near the groin, or the outside near the buttocks and upper thigh. There may be swelling or stiffness as well. It occurs more often when a person walks or performs activity. There are many reasons hip pain can develop. CAUSES  It is important to work with your caregiver to identify the cause since many conditions can impact the bones, cartilage, muscles, and tendons of the hips. Causes for hip pain include:  Broken (fractured) bones.   Separation of the thighbone from the hip socket (dislocation).   Torn cartilage of the hip joint.   Swelling (inflammation) of a tendon (tendonitis), the sac within the hip joint (bursitis), or a joint.   A weakening in the abdominal wall (hernia), affecting the nerves to the hip.   Arthritis in the hip joint or lining of the hip joint.   Pinched nerves in the back, hip, or upper thigh.   A bulging disc in the spine (herniated disc).   Rarely, bone infection or cancer.  DIAGNOSIS  The location of your hip pain will help your caregiver understand what may be causing the pain. A diagnosis is based on your medical history, your symptoms, results from your physical exam, and results from diagnostic tests. Diagnostic tests may include X-ray exams, a computerized magnetic scan (magnetic resonance imaging, MRI), or bone scan. TREATMENT  Treatment will depend on the cause of your hip pain. Treatment may include:  Limiting activities and resting until symptoms improve.   Crutches or other walking supports (a cane or brace).   Ice,  elevation, and compression.   Physical therapy or home exercises.   Shoe inserts or special shoes.   Losing weight.   Medications to reduce pain.   Undergoing surgery.  HOME CARE INSTRUCTIONS   Only take over-the-counter or prescription medicines for pain, discomfort, or fever as directed by your caregiver.   Put ice on the injured area:   Put ice in a plastic bag.   Place a towel between your skin and the bag.   Leave the ice on for 15 to 20 minutes at a time, 3 to 4 times a day.   Keep your leg raised (elevated) when possible to lessen swelling.   Avoid activities that cause pain.   Follow specific exercises as directed by your caregiver.   Sleep with a pillow between your legs on your most comfortable side.   Record how often you have hip pain, the location of the pain, and what it feels like. This information may be helpful to you and your caregiver.   Ask your caregiver about returning to work or sports and whether you should drive.   Follow up with your caregiver for further exams, therapy, or testing as directed.  SEEK MEDICAL CARE IF:   Your pain or swelling continues or worsens after 1 week.   You are feeling unwell or have chills.   You have increasing difficulty with walking.   You have a loss of sensation or other new symptoms.  You have questions or concerns.  SEEK IMMEDIATE MEDICAL CARE IF:   You cannot put weight on the affected hip.   You have fallen.   You have a sudden increase in pain and swelling in your hip.   You have a fever.  MAKE SURE YOU:   Understand these instructions.   Will watch your condition.   Will get help right away if you are not doing well or get worse.  Document Released: 08/11/2009 Document Revised: 02/10/2011 Document Reviewed: 08/11/2009 [facility] Patient Information 2012 [place], [place].Hip Pain The hips join the upper legs to the lower pelvis. The bones, cartilage, tendons, and muscles of the hip joint  perform a lot of work each day holding your body weight and allowing you to move around. Hip pain is a common symptom. It can range from a minor ache to severe pain on 1 or both hips. Pain may be felt on the inside of the hip joint near the groin, or the outside near the buttocks and upper thigh. There may be swelling or stiffness as well. It occurs more often when a person walks or performs activity. There are many reasons hip pain can develop. CAUSES  It is important to work with your caregiver to identify the cause since many conditions can impact the bones, cartilage, muscles, and tendons of the hips. Causes for hip pain include:  Broken (fractured) bones.   Separation of the thighbone from the hip socket (dislocation).   Torn cartilage of the hip joint.   Swelling (inflammation) of a tendon (tendonitis), the sac within the hip joint (bursitis), or a joint.   A weakening in the abdominal wall (hernia), affecting the nerves to the hip.   Arthritis in the hip joint or lining of the hip joint.   Pinched nerves in the back, hip, or upper thigh.   A bulging disc in the spine (herniated disc).   Rarely, bone infection or cancer.  DIAGNOSIS  The location of your hip pain will help your caregiver understand what may be causing the pain. A diagnosis is based on your medical history, your symptoms, results from your physical exam, and results from diagnostic tests. Diagnostic tests may include X-ray exams, a computerized magnetic scan (magnetic resonance imaging, MRI), or bone scan. TREATMENT  Treatment will depend on the cause of your hip pain. Treatment may include:  Limiting activities and resting until symptoms improve.   Crutches or other walking supports (a cane or brace).   Ice, elevation, and compression.   Physical therapy or home exercises.   Shoe inserts or special shoes.   Losing weight.   Medications to reduce pain.   Undergoing surgery.  HOME CARE INSTRUCTIONS    Only take over-the-counter or prescription medicines for pain, discomfort, or fever as directed by your caregiver.   Put ice on the injured area:   Put ice in a plastic bag.   Place a towel between your skin and the bag.   Leave the ice on for 15 to 20 minutes at a time, 3 to 4 times a day.   Keep your leg raised (elevated) when possible to lessen swelling.   Avoid activities that cause pain.   Follow specific exercises as directed by your caregiver.   Sleep with a pillow between your legs on your most comfortable side.   Record how often you have hip pain, the location of the pain, and what it feels like. This information may be helpful to you and your caregiver.  Ask your caregiver about returning to work or sports and whether you should drive.   Follow up with your caregiver for further exams, therapy, or testing as directed.  SEEK MEDICAL CARE IF:   Your pain or swelling continues or worsens after 1 week.   You are feeling unwell or have chills.   You have increasing difficulty with walking.   You have a loss of sensation or other new symptoms.   You have questions or concerns.  SEEK IMMEDIATE MEDICAL CARE IF:   You cannot put weight on the affected hip.   You have fallen.   You have a sudden increase in pain and swelling in your hip.   You have a fever.  MAKE SURE YOU:   Understand these instructions.   Will watch your condition.   Will get help right away if you are not doing well or get worse.  Document Released: 08/11/2009 Document Revised: 02/10/2011 Document Reviewed: 08/11/2009 [facility] Patient Information 2012 [place], [place].

## 2011-08-10 ENCOUNTER — Ambulatory Visit: Payer: PRIVATE HEALTH INSURANCE | Admitting: Physician Assistant

## 2011-08-23 ENCOUNTER — Encounter (HOSPITAL_COMMUNITY): Payer: Self-pay | Admitting: Emergency Medicine

## 2011-08-23 ENCOUNTER — Emergency Department (INDEPENDENT_AMBULATORY_CARE_PROVIDER_SITE_OTHER)
Admission: EM | Admit: 2011-08-23 | Discharge: 2011-08-23 | Disposition: A | Discharge status: Home / Self Care | Payer: Self-pay | Source: Home / Self Care | Attending: Emergency Medicine | Admitting: Emergency Medicine

## 2011-08-23 DIAGNOSIS — R51 Headache: Secondary | ICD-10-CM

## 2011-08-23 DIAGNOSIS — T148XXA Other injury of unspecified body region, initial encounter: Secondary | ICD-10-CM

## 2011-08-23 DIAGNOSIS — M25559 Pain in unspecified hip: Secondary | ICD-10-CM

## 2011-08-23 MED ORDER — HYDROCODONE-ACETAMINOPHEN 5-325 MG PO TABS: 2.0000 | ORAL_TABLET | ORAL | Status: AC | PRN

## 2011-08-23 NOTE — ED Provider Notes (Signed)
Medical screening examination/treatment/procedure(s) were performed by non-physician practitioner and as supervising physician I was immediately available for consultation/collaboration.  Khair Chasteen   Tayona Sarnowski, MD 08/23/11 1916 
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
## 2011-09-06 ENCOUNTER — Telehealth: Payer: Self-pay | Admitting: *Deleted

## 2011-09-06 DIAGNOSIS — Z3041 Encounter for surveillance of contraceptive pills: Secondary | ICD-10-CM

## 2011-09-06 NOTE — Telephone Encounter (Signed)
Pt left message stating that her pharmacy has faxed [place] a refill request and no response. She needs refill of her birth control- Camilla.  Please call back.

## 2011-09-06 NOTE — Telephone Encounter (Signed)
Called pt and left message that we can refill her Camilla for 3 mos. She will need an appt for her annual Gyn exam- she was last seen 07/2010.  Please call back and leave a message of the name and street location of her pharmacy so that we may send in the Rx.

## 2011-09-07 MED ORDER — NORETHINDRONE 0.35 MG PO TABS: 1.0000 | ORAL_TABLET | Freq: Every day | ORAL | Status: DC

## 2011-09-07 NOTE — Telephone Encounter (Signed)
Pt called and left message stating that she would like her medicine sent to [facility]. Medicine sent to pharmacy. Pt advised that she will need a yearly visit.

## 2011-09-21 ENCOUNTER — Ambulatory Visit: Payer: Self-pay | Admitting: Obstetrics & Gynecology

## 2011-10-10 ENCOUNTER — Ambulatory Visit: Payer: Self-pay | Admitting: Obstetrics and Gynecology

## 2011-10-20 ENCOUNTER — Other Ambulatory Visit (HOSPITAL_COMMUNITY)
Admission: RE | Admit: 2011-10-20 | Discharge: 2011-10-20 | Disposition: A | Discharge status: Home / Self Care | Payer: PRIVATE HEALTH INSURANCE | Source: Ambulatory Visit | Attending: Obstetrics & Gynecology | Admitting: Obstetrics & Gynecology

## 2011-10-20 ENCOUNTER — Ambulatory Visit (INDEPENDENT_AMBULATORY_CARE_PROVIDER_SITE_OTHER): Payer: PRIVATE HEALTH INSURANCE | Admitting: Obstetrics & Gynecology

## 2011-10-20 ENCOUNTER — Encounter: Payer: Self-pay | Admitting: Obstetrics & Gynecology

## 2011-10-20 VITALS — BP 126/88 | HR 88 | Temp 99.3°F | Ht 65.0 in | Wt 120.7 lb

## 2011-10-20 DIAGNOSIS — Z124 Encounter for screening for malignant neoplasm of cervix: Secondary | ICD-10-CM | POA: Insufficient documentation

## 2011-10-20 DIAGNOSIS — Z01419 Encounter for gynecological examination (general) (routine) without abnormal findings: Secondary | ICD-10-CM

## 2011-10-20 DIAGNOSIS — Z3041 Encounter for surveillance of contraceptive pills: Secondary | ICD-10-CM

## 2011-10-20 MED ORDER — NORETHINDRONE 0.35 MG PO TABS: 1.0000 | ORAL_TABLET | Freq: Every day | ORAL | Status: DC

## 2011-10-20 NOTE — Progress Notes (Signed)
Patient ID: Dawn Peters, female   DOB: [birth year], 31 y.o.   MRN: [id number]  Chief Complaint  Patient presents with  . Gynecologic Exam    HPI Dawn Peters is a 31 y.o. female.  She comes today for her yearly examination. She is on oral contraceptives and she requests a refill. She is followed by her neurologist for management of multiple sclerosis. Her last exacerbation was 8 years ago. She has regular menses that are light. HPI  Past Medical History  Diagnosis Date  . MS (multiple sclerosis)     Past Surgical History  Procedure Date  . Bilateral hip arthroscopy     No family history on file.  Social History History  Substance Use Topics  . Smoking status: Never Smoker   . Smokeless tobacco: Not on file  . Alcohol Use: No    No Known Allergies  Current Outpatient Prescriptions  Medication Sig Dispense Refill  . baclofen (LIORESAL) 20 MG tablet Take 20 mg by mouth 3 (three) times daily.      Marland Kitchen gabapentin (NEURONTIN) 800 MG tablet Take 800 mg by mouth 3 (three) times daily.      Marland Kitchen lisinopril (PRINIVIL,ZESTRIL) 20 MG tablet Take 20 mg by mouth daily.      . natalizumab (TYSABRI) 300 MG/15ML injection Inject into the vein.      Marland Kitchen norethindrone (CAMILA) 0.35 MG tablet Take 1 tablet (0.35 mg total) by mouth daily.  1 Package  12  . DISCONTD: norethindrone (CAMILA) 0.35 MG tablet Take 1 tablet (0.35 mg total) by mouth daily.  1 Package  2    Review of Systems Review of Systems  Constitutional: Negative.   HENT: Negative.   Respiratory: Negative.   Gastrointestinal: Negative.   Genitourinary: Negative.   Neurological: Negative.     Blood pressure 126/88, pulse 88, temperature 99.3 F (37.4 C), temperature source Oral, height 5\' 5"  (1.651 m), weight 54.749 kg (120 lb 11.2 oz), last menstrual period 10/02/2011.  Physical Exam Physical Exam  Constitutional: She is oriented to person, place, and time. She appears well-developed and well-nourished. No  distress.  HENT:  Head: Normocephalic.  Neck: Normal range of motion. No thyromegaly present.  Cardiovascular: Normal rate, regular rhythm and normal heart sounds.   Pulmonary/Chest: Effort normal and breath sounds normal.       Breasts are symmetric without masses or lymphadenopathy  Abdominal: Soft. She exhibits no distension and no mass. There is no tenderness.  Genitourinary: Vagina normal and uterus normal. No vaginal discharge (cervix is normal. Pap smear obtained. No adnexal masses or tenderness.) found.  Musculoskeletal: She exhibits no edema.  Neurological: She is alert and oriented to person, place, and time.  Skin: Skin is warm and dry.  Psychiatric: She has a normal mood and affect. Her behavior is normal.    Data Reviewed Previous clinic visits. Pap smear results.  Assessment    Normal well woman exam. She can continue with her oral contraceptives.    Plan    A refill of her oral contraceptives. She can come for yearly examinations.       Dawn Peters 10/20/2011, 4:20 PM

## 2011-10-20 NOTE — Patient Instructions (Signed)

## 2011-12-01 ENCOUNTER — Ambulatory Visit (INDEPENDENT_AMBULATORY_CARE_PROVIDER_SITE_OTHER): Payer: PRIVATE HEALTH INSURANCE | Admitting: Obstetrics and Gynecology

## 2011-12-01 ENCOUNTER — Encounter: Payer: Self-pay | Admitting: Obstetrics and Gynecology

## 2011-12-01 VITALS — BP 118/83 | HR 77 | Temp 98.7°F | Resp 12 | Ht 65.0 in | Wt 118.1 lb

## 2011-12-01 DIAGNOSIS — Z113 Encounter for screening for infections with a predominantly sexual mode of transmission: Secondary | ICD-10-CM

## 2011-12-01 LAB — POCT PREGNANCY, URINE: Preg Test, Ur: NEGATIVE

## 2011-12-01 NOTE — Progress Notes (Signed)
Patient ID: Dawn Peters, female   DOB: [birth year], 31 y.o.   MRN: [id number] 31 yo G1P1 with LMp 9/19 presenting today requesting STD screening. Patient has been married for 4 years and has been recently aware of husband infidelity. Patient requesting all STD testing except HIV.  GENERAL: Well-developed, well-nourished female in no acute distress.  PELVIC: Normal external female genitalia. Vagina is pink and rugated.  Normal discharge. Normal appearing cervix. Uterus is normal in size.  No adnexal mass or tenderness. EXTREMITIES: No cyanosis, clubbing, or edema, 2+ distal pulses.  A/P 31 yo G1P1 here for STD testing - GC/Ch collected - Ordered Hep B, C and RPR - Patient will return for HIV testing as she refuses testing today. - Patient will be contacted with any abnormal results.

## 2011-12-02 LAB — RPR

## 2011-12-02 LAB — HEPATITIS C ANTIBODY: HCV Ab: NEGATIVE

## 2011-12-02 LAB — HEPATITIS B SURFACE ANTIGEN: Hepatitis B Surface Ag: NEGATIVE

## 2011-12-05 ENCOUNTER — Telehealth: Payer: Self-pay | Admitting: Obstetrics and Gynecology

## 2011-12-05 NOTE — Telephone Encounter (Signed)
Patient called with request of test results. Gave results to patient; she agrees and satisfied.

## 2012-02-07 ENCOUNTER — Other Ambulatory Visit: Payer: Self-pay | Admitting: Obstetrics and Gynecology

## 2012-04-09 ENCOUNTER — Emergency Department (INDEPENDENT_AMBULATORY_CARE_PROVIDER_SITE_OTHER)
Admission: EM | Admit: 2012-04-09 | Discharge: 2012-04-09 | Disposition: A | Discharge status: Home / Self Care | Payer: PRIVATE HEALTH INSURANCE | Source: Home / Self Care | Attending: Family Medicine | Admitting: Family Medicine

## 2012-04-09 ENCOUNTER — Encounter (HOSPITAL_COMMUNITY): Payer: Self-pay

## 2012-04-09 DIAGNOSIS — J069 Acute upper respiratory infection, unspecified: Secondary | ICD-10-CM

## 2012-04-09 MED ORDER — AZITHROMYCIN 250 MG PO TABS: ORAL_TABLET | ORAL | Status: DC

## 2012-04-09 MED ORDER — IPRATROPIUM BROMIDE 0.06 % NA SOLN: 2.0000 | Freq: Four times a day (QID) | NASAL | Status: DC

## 2012-04-09 NOTE — ED Notes (Signed)
Called in all waiting areas - no response. 

## 2012-04-09 NOTE — ED Provider Notes (Signed)
History     CSN: [id number]  Arrival date & time 04/09/12  1135   First MD Initiated Contact with Patient 04/09/12 1458      Chief Complaint  Patient presents with  . URI    (Consider location/radiation/quality/duration/timing/severity/associated sxs/prior treatment) Patient is a 32 y.o. female presenting with URI. The history is provided by the patient.  URI The primary symptoms include cough. Primary symptoms do not include fever, sore throat, swollen glands, wheezing, nausea, vomiting, myalgias or rash. The current episode started more than 1 week ago. The problem has not changed since onset. The onset of the illness is associated with exposure to sick contacts. Symptoms associated with the illness include congestion and rhinorrhea.    Past Medical History  Diagnosis Date  . MS (multiple sclerosis)     Past Surgical History  Procedure Date  . Bilateral hip arthroscopy     No family history on file.  History  Substance Use Topics  . Smoking status: Never Smoker   . Smokeless tobacco: Never Used  . Alcohol Use: No    OB History    Grav Para Term Preterm Abortions TAB SAB Ect Mult Living   1 1 1       1       Review of Systems  Constitutional: Negative.  Negative for fever.  HENT: Positive for congestion, rhinorrhea and postnasal drip. Negative for sore throat.   Respiratory: Positive for cough. Negative for wheezing.   Gastrointestinal: Negative.  Negative for nausea and vomiting.  Genitourinary: Negative.   Musculoskeletal: Negative for myalgias.  Skin: Negative for rash.    Allergies  Review of patient's allergies indicates no known allergies.  Home Medications   Current Outpatient Rx  Name  Route  Sig  Dispense  Refill  . AZITHROMYCIN 250 MG PO TABS      Take as directed on pack   6 each   0   . BACLOFEN 20 MG PO TABS   Oral   Take 20 mg by mouth 3 (three) times daily.         Marland Kitchen CAMILA 0.35 MG PO TABS      TAKE 1 TABLET (0.35 MG TOTAL) BY  MOUTH DAILY.   28 tablet   2   . GABAPENTIN 800 MG PO TABS   Oral   Take 800 mg by mouth 3 (three) times daily.         . IPRATROPIUM BROMIDE 0.06 % NA SOLN   Nasal   Place 2 sprays into the nose 4 (four) times daily.   15 mL   1   . LISINOPRIL 20 MG PO TABS   Oral   Take 20 mg by mouth daily.         Marland Kitchen NATALIZUMAB 300 MG/15ML IV CONC   Intravenous   Inject into the vein.         . NORETHINDRONE 0.35 MG PO TABS   Oral   Take 1 tablet (0.35 mg total) by mouth daily.   1 Package   12     BP 131/95  Pulse 96  Temp 97.8 F (36.6 C) (Oral)  Resp 20  SpO2 100%  Physical Exam  Nursing note and vitals reviewed. Constitutional: She is oriented to person, place, and time. She appears well-developed and well-nourished.  HENT:  Head: Normocephalic.  Right Ear: External ear normal.  Left Ear: External ear normal.  Nose: Mucosal edema and rhinorrhea present.  Mouth/Throat: Oropharynx is clear and moist.  Eyes: Pupils are equal, round, and reactive to light.  Neck: Normal range of motion. Neck supple.  Pulmonary/Chest: Breath sounds normal.  Abdominal: Bowel sounds are normal.  Neurological: She is alert and oriented to person, place, and time.  Skin: Skin is warm and dry.    ED Course  Procedures (including critical care time)  Labs Reviewed - No data to display No results found.   1. URI (upper respiratory infection)       MDM          Linna Hoff, MD 04/10/12 1335

## 2012-04-09 NOTE — ED Notes (Signed)
C/o URI type syx for past few days, minimal relief w OTC medications; hist of MS

## 2012-06-15 ENCOUNTER — Telehealth: Payer: Self-pay

## 2012-06-15 NOTE — Telephone Encounter (Signed)
Patient called in and would like a RX for Flagyl.  She thinks she has a bacterial infection.  She states she has a discharge and knows the symptoms of a bacterial infection well.  She would [facility].

## 2012-06-18 NOTE — Telephone Encounter (Signed)
Called pt and pt informed me that she had went to [facility] in which she did have an infection and they treated it.  Pt did not have concerns or questions at this time.  I informed her sorry for just now getting back with her and if she does have any future concerns to please give [place] a call.  Pt stated "I will and thank you".

## 2012-07-04 ENCOUNTER — Other Ambulatory Visit: Payer: Self-pay | Admitting: Obstetrics and Gynecology

## 2012-07-05 ENCOUNTER — Telehealth: Payer: Self-pay

## 2012-07-05 NOTE — Telephone Encounter (Signed)
Pt called and stated that she wanted a refill on her BCP's.

## 2012-07-05 NOTE — Telephone Encounter (Signed)
Called pt and pt informed me what is below.  I called her pharmacy to verify no refills.  According to Dr. Olivia Mackie order pt should have had enough refills to last til August.  Called her pharmacy and they did not have refills but refilled according to Arnold's original order to last until August.  Pt stated understanding and said "thank you".

## 2012-07-23 ENCOUNTER — Emergency Department (INDEPENDENT_AMBULATORY_CARE_PROVIDER_SITE_OTHER)
Admission: EM | Admit: 2012-07-23 | Discharge: 2012-07-23 | Disposition: A | Discharge status: Home / Self Care | Payer: PRIVATE HEALTH INSURANCE | Source: Home / Self Care | Attending: Emergency Medicine | Admitting: Emergency Medicine

## 2012-07-23 ENCOUNTER — Encounter (HOSPITAL_COMMUNITY): Payer: Self-pay | Admitting: Emergency Medicine

## 2012-07-23 DIAGNOSIS — IMO0001 Reserved for inherently not codable concepts without codable children: Secondary | ICD-10-CM

## 2012-07-23 DIAGNOSIS — M791 Myalgia, unspecified site: Secondary | ICD-10-CM

## 2012-07-23 HISTORY — DX: Essential (primary) hypertension: I10

## 2012-07-23 MED ORDER — CYCLOBENZAPRINE HCL 10 MG PO TABS: 10.0000 mg | ORAL_TABLET | Freq: Three times a day (TID) | ORAL | Status: DC | PRN
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
## 2012-07-28 ENCOUNTER — Encounter (HOSPITAL_COMMUNITY): Payer: Self-pay

## 2012-07-28 ENCOUNTER — Emergency Department (HOSPITAL_COMMUNITY)
Admission: EM | Admit: 2012-07-28 | Discharge: 2012-07-28 | Disposition: A | Discharge status: Home / Self Care | Payer: PRIVATE HEALTH INSURANCE | Attending: Emergency Medicine | Admitting: Emergency Medicine

## 2012-07-28 DIAGNOSIS — Z8669 Personal history of other diseases of the nervous system and sense organs: Secondary | ICD-10-CM | POA: Insufficient documentation

## 2012-07-28 DIAGNOSIS — R209 Unspecified disturbances of skin sensation: Secondary | ICD-10-CM | POA: Insufficient documentation

## 2012-07-28 DIAGNOSIS — F419 Anxiety disorder, unspecified: Secondary | ICD-10-CM

## 2012-07-28 DIAGNOSIS — F411 Generalized anxiety disorder: Secondary | ICD-10-CM | POA: Insufficient documentation

## 2012-07-28 DIAGNOSIS — Z79899 Other long term (current) drug therapy: Secondary | ICD-10-CM | POA: Insufficient documentation

## 2012-07-28 DIAGNOSIS — I10 Essential (primary) hypertension: Secondary | ICD-10-CM | POA: Insufficient documentation

## 2012-07-28 LAB — CBC WITH DIFFERENTIAL/PLATELET
Basophils Absolute: 0 10*3/uL (ref 0.0–0.1)
Basophils Relative: 0 % (ref 0–1)
Eosinophils Absolute: 0 10*3/uL (ref 0.0–0.7)
Hemoglobin: 16.1 g/dL — ABNORMAL HIGH (ref 12.0–15.0)
MCH: 33.9 pg (ref 26.0–34.0)
MCHC: 36.5 g/dL — ABNORMAL HIGH (ref 30.0–36.0)
Monocytes Absolute: 0.4 10*3/uL (ref 0.1–1.0)
Monocytes Relative: 11 % (ref 3–12)
Neutro Abs: 2.7 10*3/uL (ref 1.7–7.7)
Neutrophils Relative %: 65 % (ref 43–77)
RDW: 12.5 % (ref 11.5–15.5)

## 2012-07-28 LAB — COMPREHENSIVE METABOLIC PANEL
AST: 17 U/L (ref 0–37)
Albumin: 4.1 g/dL (ref 3.5–5.2)
BUN: 10 mg/dL (ref 6–23)
Chloride: 98 mEq/L (ref 96–112)
Creatinine, Ser: 0.77 mg/dL (ref 0.50–1.10)
Potassium: 3 mEq/L — ABNORMAL LOW (ref 3.5–5.1)
Total Bilirubin: 0.6 mg/dL (ref 0.3–1.2)
Total Protein: 7.3 g/dL (ref 6.0–8.3)

## 2012-07-28 LAB — GLUCOSE, CAPILLARY: Glucose-Capillary: 84 mg/dL (ref 70–99)

## 2012-07-28 MED ORDER — SODIUM CHLORIDE 0.9 % IV BOLUS (SEPSIS)
1000.0000 mL | Freq: Once | INTRAVENOUS | Status: AC
Start: 2012-07-28 — End: 2012-07-28
  Administered 2012-07-28: 1000 mL via INTRAVENOUS

## 2012-07-28 MED ORDER — LORAZEPAM 2 MG/ML IJ SOLN
1.0000 mg | Freq: Once | INTRAMUSCULAR | Status: AC
  Administered 2012-07-28: 1 mg via INTRAVENOUS
  Filled 2012-07-28: qty 1

## 2012-07-28 MED ORDER — ONDANSETRON HCL 4 MG/2ML IJ SOLN
4.0000 mg | Freq: Once | INTRAMUSCULAR | Status: AC
  Administered 2012-07-28: 4 mg via INTRAVENOUS
  Filled 2012-07-28: qty 2

## 2012-07-28 MED ORDER — PROMETHAZINE HCL 25 MG PO TABS: 25.0000 mg | ORAL_TABLET | Freq: Four times a day (QID) | ORAL | Status: DC | PRN

## 2012-07-28 MED ORDER — POTASSIUM CHLORIDE CRYS ER 20 MEQ PO TBCR
40.0000 meq | EXTENDED_RELEASE_TABLET | Freq: Once | ORAL | Status: AC
  Administered 2012-07-28: 40 meq via ORAL
  Filled 2012-07-28: qty 2

## 2012-07-28 MED ORDER — LORAZEPAM 1 MG PO TABS: 1.0000 mg | ORAL_TABLET | Freq: Three times a day (TID) | ORAL | Status: DC | PRN

## 2012-07-28 MED ORDER — PROMETHAZINE HCL 25 MG RE SUPP: 25.0000 mg | Freq: Four times a day (QID) | RECTAL | Status: DC | PRN

## 2012-07-28 NOTE — ED Notes (Signed)
ZOX:[id number]<AV> Expected date:07/28/12<BR> Expected time:10:22 AM<BR> Means of arrival:Ambulance<BR> Comments:<BR> Arms tingling ? Anxiety related

## 2012-07-28 NOTE — ED Notes (Addendum)
Pt escorted to discharge window. Verbalized understanding discharge instructions. In no acute distress.  Pt will wait in lobby for ride.   

## 2012-07-28 NOTE — ED Notes (Signed)
MD at bedside. 

## 2012-07-28 NOTE — ED Notes (Signed)
Pt c/o bilateral upper extremity tingling and general weakness x 1 day.  Sts she has "only eaten one meal in 24hrs and vomited it up."  Sts symptoms started prior to a funeral, yesterday, for a "friend that was like family."  A & Ox4.  NAD noted.  Hx of MS.  Negative Stroke screen.

## 2012-07-28 NOTE — ED Provider Notes (Signed)
History     CSN: [id number]  Arrival date & time 07/28/12  1032   First MD Initiated Contact with Patient 07/28/12 1052      Chief Complaint  Patient presents with  . Tingling  . Weakness    (Consider location/radiation/quality/duration/timing/severity/associated sxs/prior treatment) Patient is a 32 y.o. female presenting with weakness. The history is provided by the patient (the pt complains of nauseau and anxiety since she went to a recent funeral).  Weakness This is a new problem. The current episode started more than 2 days ago. The problem occurs constantly. The problem has not changed since onset.Pertinent negatives include no chest pain, no abdominal pain and no headaches. Nothing aggravates the symptoms. Nothing relieves the symptoms.    Past Medical History  Diagnosis Date  . MS (multiple sclerosis)   . Hypertension     Past Surgical History  Procedure Laterality Date  . Bilateral hip arthroscopy      History reviewed. No pertinent family history.  History  Substance Use Topics  . Smoking status: Never Smoker   . Smokeless tobacco: Never Used  . Alcohol Use: No    OB History   Grav Para Term Preterm Abortions TAB SAB Ect Mult Living   1 1 1       1       Review of Systems  Constitutional: Negative for appetite change and fatigue.  HENT: Negative for congestion, sinus pressure and ear discharge.   Eyes: Negative for discharge.  Respiratory: Negative for cough.   Cardiovascular: Negative for chest pain.  Gastrointestinal: Negative for abdominal pain and diarrhea.  Genitourinary: Negative for frequency and hematuria.  Musculoskeletal: Negative for back pain.  Skin: Negative for rash.  Neurological: Positive for weakness. Negative for seizures and headaches.  Psychiatric/Behavioral: Positive for agitation. Negative for hallucinations.    Allergies  Review of patient's allergies indicates no known allergies.  Home Medications   Current Outpatient  Rx  Name  Route  Sig  Dispense  Refill  . baclofen (LIORESAL) 20 MG tablet   Oral   Take 20 mg by mouth 3 (three) times daily as needed (for ms).          . cyclobenzaprine (FLEXERIL) 10 MG tablet   Oral   Take 1 tablet (10 mg total) by mouth 3 (three) times daily as needed for muscle spasms.   20 tablet   0   . docusate sodium (COLACE) 100 MG capsule   Oral   Take 200 mg by mouth daily as needed for constipation.         . gabapentin (NEURONTIN) 800 MG tablet   Oral   Take 800 mg by mouth 3 (three) times daily.         Marland Kitchen ipratropium (ATROVENT) 0.06 % nasal spray   Nasal   Place 2 sprays into the nose 4 (four) times daily as needed for rhinitis.         Marland Kitchen lisinopril (PRINIVIL,ZESTRIL) 20 MG tablet   Oral   Take 20 mg by mouth daily.         . Multiple Vitamin (MULTIVITAMIN WITH MINERALS) TABS   Oral   Take 1 tablet by mouth daily.         . norethindrone (CAMILA) 0.35 MG tablet   Oral   Take 1 tablet (0.35 mg total) by mouth daily.   1 Package   12   . LORazepam (ATIVAN) 1 MG tablet   Oral   Take 1 tablet (  1 mg total) by mouth every 8 (eight) hours as needed for anxiety.   20 tablet   0   . promethazine (PHENERGAN) 25 MG suppository   Rectal   Place 1 suppository (25 mg total) rectally every 6 (six) hours as needed for nausea.   12 each   0     BP 161/111  Pulse 97  Temp(Src) 97.6 F (36.4 C) (Oral)  Resp 18  Ht 5\' 5"  (1.651 m)  Wt 120 lb (54.432 kg)  BMI 19.97 kg/m2  SpO2 100%  LMP 07/23/2012  Physical Exam  Constitutional: She is oriented to person, place, and time. She appears well-developed.  HENT:  Head: Normocephalic.  Eyes: Conjunctivae and EOM are normal. No scleral icterus.  Neck: Neck supple. No thyromegaly present.  Cardiovascular: Normal rate and regular rhythm.  Exam reveals no gallop and no friction rub.   No murmur heard. Pulmonary/Chest: No stridor. She has no wheezes. She has no rales. She exhibits no tenderness.   Abdominal: She exhibits no distension. There is no tenderness. There is no rebound.  Musculoskeletal: Normal range of motion. She exhibits no edema.  Lymphadenopathy:    She has no cervical adenopathy.  Neurological: She is oriented to person, place, and time. Coordination normal.  Moderate anxiety  Skin: No rash noted. No erythema.  Psychiatric: She has a normal mood and affect. Her behavior is normal.    ED Course  Procedures (including critical care time)  Labs Reviewed  CBC WITH DIFFERENTIAL - Abnormal; Notable for the following:    Hemoglobin 16.1 (*)    MCHC 36.5 (*)    All other components within normal limits  COMPREHENSIVE METABOLIC PANEL - Abnormal; Notable for the following:    Potassium 3.0 (*)    All other components within normal limits  GLUCOSE, CAPILLARY   No results found.   1. Anxiety       MDM          Benny Lennert, MD 07/28/12 1254

## 2012-07-28 NOTE — ED Notes (Signed)
Pt sts tingling in arms, intermittent tightening/cramping in stomach and intermittent SOB.  Pt O2 sat 100% RA.

## 2012-07-28 NOTE — ED Notes (Signed)
CBG 84 on ED Glucometer

## 2012-07-31 ENCOUNTER — Telehealth: Payer: Self-pay | Admitting: *Deleted

## 2012-07-31 DIAGNOSIS — B9689 Other specified bacterial agents as the cause of diseases classified elsewhere: Secondary | ICD-10-CM

## 2012-07-31 MED ORDER — METRONIDAZOLE 500 MG PO TABS: 500.0000 mg | ORAL_TABLET | Freq: Two times a day (BID) | ORAL | Status: DC

## 2012-07-31 NOTE — Telephone Encounter (Signed)
Patient left message requesting a rx for Flagyl. She is having a vaginal discharge with odor. She has had BV before and would like an rx for it sent to her pharmacy. Per protocol rx sent to [facility].

## 2012-09-18 ENCOUNTER — Other Ambulatory Visit: Payer: Self-pay

## 2012-09-18 DIAGNOSIS — Z3041 Encounter for surveillance of contraceptive pills: Secondary | ICD-10-CM

## 2012-09-18 MED ORDER — NORETHINDRONE 0.35 MG PO TABS: 1.0000 | ORAL_TABLET | Freq: Every day | ORAL | Status: DC

## 2012-09-18 NOTE — Telephone Encounter (Signed)
Received refill request from [facility] pharmacy via fax.  E-prescribe Camila tablets according to standing protocol.

## 2012-09-24 ENCOUNTER — Ambulatory Visit (INDEPENDENT_AMBULATORY_CARE_PROVIDER_SITE_OTHER): Payer: 59 | Admitting: Physician Assistant

## 2012-09-24 VITALS — BP 112/70 | HR 105 | Temp 97.9°F | Resp 18 | Ht 65.5 in | Wt 114.0 lb

## 2012-09-24 DIAGNOSIS — N760 Acute vaginitis: Secondary | ICD-10-CM

## 2012-09-24 DIAGNOSIS — N898 Other specified noninflammatory disorders of vagina: Secondary | ICD-10-CM

## 2012-09-24 DIAGNOSIS — L293 Anogenital pruritus, unspecified: Secondary | ICD-10-CM

## 2012-09-24 DIAGNOSIS — F411 Generalized anxiety disorder: Secondary | ICD-10-CM

## 2012-09-24 LAB — POCT WET PREP WITH KOH
KOH Prep POC: NEGATIVE
Trichomonas, UA: NEGATIVE
Yeast Wet Prep HPF POC: NEGATIVE

## 2012-09-24 MED ORDER — METRONIDAZOLE 500 MG PO TABS: 500.0000 mg | ORAL_TABLET | Freq: Two times a day (BID) | ORAL | Status: DC

## 2012-09-24 MED ORDER — LORAZEPAM 1 MG PO TABS: 1.0000 mg | ORAL_TABLET | Freq: Every day | ORAL | Status: DC | PRN

## 2012-09-24 NOTE — Progress Notes (Signed)
  Subjective:    Patient ID: Dawn Peters, female    DOB: 31-Oct-1980, 32 y.o.   MRN: [id number]  HPI 32 year old female presents with 3 day history of vaginal pruritis and irritation.  Has not noticed any discharge or rashes.  Denies dysuria, urinary frequency, abdominal pain, nausea, vomiting, fever, or chills.  Is sexually active with 1 female partner - new since last STD testing 11/2011.  Had negative RPR, Hep B and C, and GC/CL.  Diagnosed with MS in 2011 so does have regular HIV screening secondary to this - always negative.  LNMP 09/10/12. On OCP's which she takes faithfully.    Also complains of panic attacks x 2 episodes. States she had one 3 months ago at a funeral - went to ER and given rx for Ativan which seemed to help. Has only had 1 attack since then but but was out of medication. Requesting refill of this today.  No hx of prior treatment of anxiety or depression. Denies any new stressors.      Review of Systems  Constitutional: Negative for fever and chills.  Gastrointestinal: Negative for nausea and vomiting.  Genitourinary: Negative for dysuria, frequency and vaginal discharge (pruritis).  Skin: Negative for rash.  Neurological: Negative for headaches.       Objective:   Physical Exam  Constitutional: She is oriented to person, place, and time. She appears well-developed and well-nourished.  HENT:  Head: Normocephalic and atraumatic.  Right Ear: External ear normal.  Left Ear: External ear normal.  Eyes: Conjunctivae are normal.  Neck: Normal range of motion.  Cardiovascular: Normal rate, regular rhythm and normal heart sounds.   Pulmonary/Chest: Effort normal and breath sounds normal.  Genitourinary: Vagina normal. Pelvic exam was performed with patient supine. There is no rash, tenderness or lesion on the right labia. There is no rash, tenderness or lesion on the left labia. Cervix exhibits discharge (thin, yellow/white). Cervix exhibits no motion tenderness and no  friability. Right adnexum displays no mass, no tenderness and no fullness. Left adnexum displays no mass, no tenderness and no fullness.  Neurological: She is alert and oriented to person, place, and time.  Psychiatric: She has a normal mood and affect. Her behavior is normal. Judgment and thought content normal.          Assessment & Plan:  Leukorrhea, not specified as infective - Plan: POCT Wet Prep with KOH, GC/Chlamydia Probe Amp  Vaginal itching - Plan: POCT Wet Prep with KOH  Anxiety state, unspecified - Plan: LORazepam (ATIVAN) 1 MG tablet  Bacterial vaginosis - Plan: metroNIDAZOLE (FLAGYL) 500 MG tablet GC/CL pending Flagyl 500 mg bid x 7 days Ativen to use only at onset of panic attacks. If needing to use this more frequently, or if she needs a refill on this, she will need OV to discuss treatment options.

## 2012-09-25 LAB — GC/CHLAMYDIA PROBE AMP
CT Probe RNA: NEGATIVE
GC Probe RNA: NEGATIVE

## 2012-10-01 ENCOUNTER — Ambulatory Visit (INDEPENDENT_AMBULATORY_CARE_PROVIDER_SITE_OTHER): Payer: 59 | Admitting: *Deleted

## 2012-10-01 VITALS — BP 100/60 | HR 93 | Temp 98.0°F | Resp 16

## 2012-10-01 DIAGNOSIS — Z111 Encounter for screening for respiratory tuberculosis: Secondary | ICD-10-CM

## 2012-10-01 NOTE — Progress Notes (Signed)
  Tuberculosis Risk Questionnaire  1. Were you born outside the [place] in one of the following parts of the world: [place], [place], [place], [place] or [place]?  No  2. Have you traveled outside the [place] and lived for more than one month in one of the following parts of the world: [place], [place], [place], [place] or [place]?  No  3. Do you have a compromised immune system such as from any of the following conditions:HIV/AIDS, organ or bone marrow transplantation, diabetes, immunosuppressive medicines (e.g. Prednisone, Remicaide), leukemia, lymphoma, cancer of the head or neck, gastrectomy or jejunal bypass, end-stage renal disease (on dialysis), or silicosis?  No   4. Have you ever or do you plan on working in: a residential care center, a health care facility, a jail or prison or homeless shelter?  No  5. Have you ever: injected illegal drugs, used crack cocaine, lived in a homeless shelter  or been in jail or prison?   No  6. Have you ever been exposed to anyone with infectious tuberculosis?  No   Tuberculosis Symptom Questionnaire  Do you currently have any of the following symptoms?  1. Unexplained cough lasting more than 3 weeks? No  2. Unexplained fever lasting more than 3 weeks. No   3. Night Sweats (sweating that leaves the bedclothes and sheets wet)   No  4. Shortness of Breath No  5. Chest Pain No  6. Unintentional weight loss  No  7. Unexplained fatigue (very tired for no reason) No  

## 2012-10-03 ENCOUNTER — Ambulatory Visit (INDEPENDENT_AMBULATORY_CARE_PROVIDER_SITE_OTHER): Payer: 59 | Admitting: *Deleted

## 2012-10-03 DIAGNOSIS — Z111 Encounter for screening for respiratory tuberculosis: Secondary | ICD-10-CM

## 2012-10-03 LAB — TB SKIN TEST
Induration: 0 mm
TB Skin Test: NEGATIVE

## 2012-10-30 ENCOUNTER — Ambulatory Visit (INDEPENDENT_AMBULATORY_CARE_PROVIDER_SITE_OTHER): Payer: 59 | Admitting: Physician Assistant

## 2012-10-30 VITALS — BP 100/72 | HR 99 | Temp 98.7°F | Resp 16 | Ht 66.0 in | Wt 111.0 lb

## 2012-10-30 DIAGNOSIS — B353 Tinea pedis: Secondary | ICD-10-CM

## 2012-10-30 DIAGNOSIS — L299 Pruritus, unspecified: Secondary | ICD-10-CM

## 2012-10-30 LAB — POCT SKIN KOH: Skin KOH, POC: NEGATIVE

## 2012-10-30 MED ORDER — KETOCONAZOLE 2 % EX CREA: TOPICAL_CREAM | Freq: Every day | CUTANEOUS | Status: DC

## 2012-10-30 NOTE — Patient Instructions (Addendum)
Begin using the ketoconazole cream once daily to the affected areas.  Make sure you are getting your feet and toes really dry after showering.  Keep the toes open to air when possible, clean dry socks when your feet have to be covered.  Continue using the cream until symptoms resolve - this may take several weeks.  If anything is worsening or not improving, please let us know   Athlete's Foot Athlete's foot (tinea pedis) is a fungal infection of the skin on the feet. It often occurs on the skin between the toes or underneath the toes. It can also occur on the soles of the feet. Athlete's foot is more likely to occur in hot, humid weather. Not washing your feet or changing your socks often enough can contribute to athlete's foot. The infection can spread from person to person (contagious). CAUSES Athlete's foot is caused by a fungus. This fungus thrives in warm, moist places. Most people get athlete's foot by sharing shower stalls, towels, and wet floors with an infected person. People with weakened immune systems, including those with diabetes, may be more likely to get athlete's foot. SYMPTOMS   Itchy areas between the toes or on the soles of the feet.  White, flaky, or scaly areas between the toes or on the soles of the feet.  Tiny, intensely itchy blisters between the toes or on the soles of the feet.  Tiny cuts on the skin. These cuts can develop a bacterial infection.  Thick or discolored toenails. DIAGNOSIS  Your caregiver can usually tell what the problem is by doing a physical exam. Your caregiver may also take a skin sample from the rash area. The skin sample may be examined under a microscope, or it may be tested to see if fungus will grow in the sample. A sample may also be taken from your toenail for testing. TREATMENT  Over-the-counter and prescription medicines can be used to kill the fungus. These medicines are available as powders or creams. Your caregiver can suggest medicines  for you. Fungal infections respond slowly to treatment. You may need to continue using your medicine for several weeks. PREVENTION   Do not share towels.  Wear sandals in wet areas, such as shared locker rooms and shared showers.  Keep your feet dry. Wear shoes that allow air to circulate. Wear cotton or wool socks. HOME CARE INSTRUCTIONS   Take medicines as directed by your caregiver. Do not use steroid creams on athlete's foot.  Keep your feet clean and cool. Wash your feet daily and dry them thoroughly, especially between your toes.  Change your socks every day. Wear cotton or wool socks. In hot climates, you may need to change your socks 2 to 3 times per day.  Wear sandals or canvas tennis shoes with good air circulation.  If you have blisters, soak your feet in Burow's solution or Epsom salts for 20 to 30 minutes, 2 times a day to dry out the blisters. Make sure you dry your feet thoroughly afterward. SEEK MEDICAL CARE IF:   You have a fever.  You have swelling, soreness, warmth, or redness in your foot.  You are not getting better after 7 days of treatment.  You are not completely cured after 30 days.  You have any problems caused by your medicines. MAKE SURE YOU:   Understand these instructions.  Will watch your condition.  Will get help right away if you are not doing well or get worse. Document Released: 02/19/2000 Document  Avoid picking or squeezing your pimples. This can make your acne worse and cause scarring.  SEEK MEDICAL CARE IF:   · Your acne is not better after 8 weeks.  · Your acne gets worse.  · You have a large area of skin that is red or tender.  Document Released: 02/19/2000 Document Revised: 05/16/2011 Document Reviewed: 12/10/2010  [employer]® Patient Information ©2014 [employer].

## 2012-10-30 NOTE — Progress Notes (Signed)
  Subjective:    Patient ID: Dawn Peters, female    DOB: Jun 07, 1980, 32 y.o.   MRN: [id number]  HPI   Dawn Peters is a very pleasant 32 yr old female here because "something is wrong with my feet."  States this has been going on "for awhile", probably weeks to months.  Notes the feet are very itchy - wakes up in the night scratching them.  Notes peeling skin between toes, bottom of right foot.  Previously had very itchy red bumps on top of left foot.  Thinks perhaps this is just dry skin.  As tried otc itch cream with no relief.  Otherwise feels well, no other skin involvement or associated symptoms.     Review of Systems  Constitutional: Negative.   HENT: Negative.   Respiratory: Negative.   Cardiovascular: Negative.   Gastrointestinal: Negative.   Musculoskeletal: Negative.   Skin: Positive for rash (feet).  Neurological: Negative.        Objective:   Physical Exam  Vitals reviewed. Constitutional: She is oriented to person, place, and time. She appears well-developed and well-nourished. No distress.  HENT:  Head: Normocephalic and atraumatic.  Eyes: Conjunctivae are normal. No scleral icterus.  Pulmonary/Chest: Effort normal.  Neurological: She is alert and oriented to person, place, and time.  Skin: Skin is warm and dry.  Maceration between all toes (R foot > L foot), small fissure in 1st web space of right foot; scaling/peeling skin at plantar surface of right foot  Psychiatric: She has a normal mood and affect. Her behavior is normal.     Results for orders placed in visit on 10/30/12  POCT SKIN KOH      Result Value Range   Skin KOH, POC Negative          Assessment & Plan:  Tinea pedis - Plan: ketoconazole (NIZORAL) 2 % cream, DISCONTINUED: ketoconazole (NIZORAL) 2 % cream  Pruritus - Plan: POCT Skin KOH   Dawn Peters is a very pleasant 32 yr old female here with several weeks of itching/scaling skin on bilateral feel.  KOH negative, but  appears consistent with tinea pedis.  Will treat with ketoconazole daily.  Discussed with pt that this may take several weeks to fully clear.  Encouraged her to keep the feet clean and dry, open to air when possible.  If any symptoms worsening or not improving, pt to RTC.

## 2012-10-31 ENCOUNTER — Telehealth: Payer: Self-pay | Admitting: Obstetrics and Gynecology

## 2012-10-31 DIAGNOSIS — B373 Candidiasis of vulva and vagina: Secondary | ICD-10-CM

## 2012-10-31 MED ORDER — FLUCONAZOLE 150 MG PO TABS: 150.0000 mg | ORAL_TABLET | Freq: Once | ORAL | Status: DC

## 2012-10-31 NOTE — Telephone Encounter (Signed)
Patient called with c/o itchy around labia with discharge. Per protocol diflucan sent to pharmacy on file. Patient satisfied.

## 2012-11-05 ENCOUNTER — Ambulatory Visit (INDEPENDENT_AMBULATORY_CARE_PROVIDER_SITE_OTHER): Payer: 59 | Admitting: Family Medicine

## 2012-11-05 VITALS — BP 108/64 | HR 114 | Temp 98.0°F | Resp 17 | Ht 66.0 in | Wt 110.0 lb

## 2012-11-05 DIAGNOSIS — A5901 Trichomonal vulvovaginitis: Secondary | ICD-10-CM

## 2012-11-05 DIAGNOSIS — N898 Other specified noninflammatory disorders of vagina: Secondary | ICD-10-CM

## 2012-11-05 DIAGNOSIS — R634 Abnormal weight loss: Secondary | ICD-10-CM

## 2012-11-05 DIAGNOSIS — A64 Unspecified sexually transmitted disease: Secondary | ICD-10-CM

## 2012-11-05 LAB — POCT URINALYSIS DIPSTICK
Bilirubin, UA: NEGATIVE
Blood, UA: NEGATIVE
Glucose, UA: NEGATIVE
Ketones, UA: NEGATIVE
Nitrite, UA: NEGATIVE
Protein, UA: NEGATIVE
Spec Grav, UA: 1.02
Urobilinogen, UA: 0.2
pH, UA: 6.5

## 2012-11-05 LAB — POCT WET PREP WITH KOH
Clue Cells Wet Prep HPF POC: NEGATIVE
KOH Prep POC: NEGATIVE
Trichomonas, UA: POSITIVE
Yeast Wet Prep HPF POC: NEGATIVE

## 2012-11-05 LAB — POCT UA - MICROSCOPIC ONLY
Casts, Ur, LPF, POC: NEGATIVE
Crystals, Ur, HPF, POC: NEGATIVE
Mucus, UA: NEGATIVE
RBC, urine, microscopic: NEGATIVE
Yeast, UA: NEGATIVE

## 2012-11-05 LAB — POCT GLYCOSYLATED HEMOGLOBIN (HGB A1C): Hemoglobin A1C: 4.7

## 2012-11-05 LAB — COMPREHENSIVE METABOLIC PANEL
ALT: 8 U/L (ref 0–35)
Albumin: 4.6 g/dL (ref 3.5–5.2)
Alkaline Phosphatase: 53 U/L (ref 39–117)
CO2: 29 mEq/L (ref 19–32)
Potassium: 4 mEq/L (ref 3.5–5.3)
Sodium: 141 mEq/L (ref 135–145)
Total Bilirubin: 0.5 mg/dL (ref 0.3–1.2)
Total Protein: 7.2 g/dL (ref 6.0–8.3)

## 2012-11-05 LAB — TSH: TSH: 0.607 u[IU]/mL (ref 0.350–4.500)

## 2012-11-05 MED ORDER — METRONIDAZOLE 500 MG PO TABS: 2000.0000 mg | ORAL_TABLET | Freq: Once | ORAL | Status: DC

## 2012-11-05 NOTE — Progress Notes (Signed)
Subjective:    Patient ID: Dawn Peters, female    DOB: 04/13/1980, 32 y.o.   MRN: [id number] Chief Complaint  Patient presents with  . Exposure to STD    HPI  Yest having orange d/c and now having green d/c - no odor pain or itch. Was having some itch and so called her gyn who treated her with diflucan and it went away.  Last intercourse was 3 wks ago with same partner so not using condoms.  Has never had a discharge like this before so was very concerned.  Has had a stressful year. Very busy with kids and diagnosed with MS.  Often forgets to eat. Sometimes doesn't eat till 4 p.m.  Is continuing to loose weight over the years and wonders if there is a medication to help her appetite.  Past Medical History  Diagnosis Date  . MS (multiple sclerosis)   . Hypertension    Current Outpatient Prescriptions on File Prior to Visit  Medication Sig Dispense Refill  . baclofen (LIORESAL) 20 MG tablet Take 20 mg by mouth 3 (three) times daily as needed (for ms).       . cyclobenzaprine (FLEXERIL) 10 MG tablet Take 1 tablet (10 mg total) by mouth 3 (three) times daily as needed for muscle spasms.  20 tablet  0  . docusate sodium (COLACE) 100 MG capsule Take 200 mg by mouth daily as needed for constipation.      . fluconazole (DIFLUCAN) 150 MG tablet Take 1 tablet (150 mg total) by mouth once.  1 tablet  0  . gabapentin (NEURONTIN) 800 MG tablet Take 800 mg by mouth 3 (three) times daily.      Marland Kitchen ipratropium (ATROVENT) 0.06 % nasal spray Place 2 sprays into the nose 4 (four) times daily as needed for rhinitis.      Marland Kitchen ketoconazole (NIZORAL) 2 % cream Apply topically daily.  75 g  1  . lisinopril (PRINIVIL,ZESTRIL) 20 MG tablet Take 20 mg by mouth daily.      Marland Kitchen LORazepam (ATIVAN) 1 MG tablet Take 1 tablet (1 mg total) by mouth daily as needed for anxiety. May take at onset of panic attack  20 tablet  0  . metroNIDAZOLE (FLAGYL) 500 MG tablet Take 1 tablet (500 mg total) by mouth 2 (two) times  daily.  14 tablet  0  . metroNIDAZOLE (FLAGYL) 500 MG tablet Take 1 tablet (500 mg total) by mouth 2 (two) times daily with a meal. DO NOT CONSUME ALCOHOL WHILE TAKING THIS MEDICATION.  14 tablet  0  . Multiple Vitamin (MULTIVITAMIN WITH MINERALS) TABS Take 1 tablet by mouth daily.      . norethindrone (CAMILA) 0.35 MG tablet Take 1 tablet (0.35 mg total) by mouth daily.  1 Package  3   No current facility-administered medications on file prior to visit.   No Known Allergies   Review of Systems  Constitutional: Positive for unexpected weight change. Negative for fever, chills, diaphoresis, activity change, appetite change and fatigue.  Gastrointestinal: Negative for abdominal pain, diarrhea, constipation, blood in stool, anal bleeding and rectal pain.  Endocrine: Negative for polyphagia.  Genitourinary: Positive for vaginal discharge. Negative for dysuria, urgency, frequency, hematuria, decreased urine volume, vaginal bleeding, difficulty urinating, genital sores, vaginal pain, menstrual problem, pelvic pain and dyspareunia.  Musculoskeletal: Negative for gait problem.  Skin: Negative for rash.  Hematological: Negative for adenopathy.      BP 108/64  Pulse 114  Temp(Src) 98 F (36.7  C) (Oral)  Resp 17  Ht 5\' 6"  (1.676 m)  Wt 110 lb (49.896 kg)  BMI 17.76 kg/m2  SpO2 99%  LMP 10/30/2012  Objective:   Physical Exam  Constitutional: She is oriented to person, place, and time. She appears well-developed and well-nourished. No distress.  HENT:  Head: Normocephalic and atraumatic.  Cardiovascular: Normal rate, regular rhythm, normal heart sounds and intact distal pulses.   Pulmonary/Chest: Effort normal and breath sounds normal.  Abdominal: Soft. Bowel sounds are normal. She exhibits no distension. There is no tenderness. There is no rebound and no guarding.  Genitourinary: Uterus normal. Pelvic exam was performed with patient supine. There is no rash, tenderness or lesion on the  right labia. There is no rash, tenderness or lesion on the left labia. Cervix exhibits discharge. Cervix exhibits no motion tenderness and no friability. Right adnexum displays no mass, no tenderness and no fullness. Left adnexum displays no mass, no tenderness and no fullness. No erythema or tenderness around the vagina. Vaginal discharge found.  Large amount of frothy greenish discharge  Lymphadenopathy:       Right: No inguinal adenopathy present.       Left: No inguinal adenopathy present.  Neurological: She is alert and oriented to person, place, and time.  Skin: Skin is warm and dry. She is not diaphoretic.  Psychiatric: She has a normal mood and affect. Her behavior is normal.          Results for orders placed in visit on 11/05/12  POCT UA - MICROSCOPIC ONLY      Result Value Range   WBC, Ur, HPF, POC 5-10     RBC, urine, microscopic neg     Bacteria, U Microscopic small     Mucus, UA neg     Epithelial cells, urine per micros 2-3     Crystals, Ur, HPF, POC neg     Casts, Ur, LPF, POC neg     Yeast, UA neg    POCT URINALYSIS DIPSTICK      Result Value Range   Color, UA yellow     Clarity, UA clear     Glucose, UA neg     Bilirubin, UA neg     Ketones, UA neg     Spec Grav, UA 1.020     Blood, UA neg     pH, UA 6.5     Protein, UA neg     Urobilinogen, UA 0.2     Nitrite, UA neg     Leukocytes, UA small (1+)    POCT GLYCOSYLATED HEMOGLOBIN (HGB A1C)      Result Value Range   Hemoglobin A1C 4.7    POCT WET PREP WITH KOH      Result Value Range   Trichomonas, UA Positive     Clue Cells Wet Prep HPF POC neg     Epithelial Wet Prep HPF POC 0-3     Yeast Wet Prep HPF POC neg     Bacteria Wet Prep HPF POC small     RBC Wet Prep HPF POC neg     WBC Wet Prep HPF POC TNTC     KOH Prep POC Negative      Assessment & Plan:  STD (female) - Plan: POCT UA - Microscopic Only, POCT urinalysis dipstick, GC/Chlamydia Probe Amp, GC/Chlamydia Probe Amp  Vaginal discharge -  Plan: POCT Wet Prep with KOH, Urine culture, GC/Chlamydia Probe Amp  Loss of weight - Plan: POCT glycosylated hemoglobin (  Hb A1C), TSH, Comprehensive metabolic panel - recommend pt make an appt for a full CPE. If all w/u nml, could consider trial of remeron or megace but would need OV to discuss. Rec try to increase freq of meals and supp with snacks like ensure or protein bars in between meals - keep non-perishable food in purse and car in case she forgets to eat a meal.  Trichomonal vaginitis - flagyl, partners need treated.  Meds ordered this encounter  Medications  . metroNIDAZOLE (FLAGYL) 500 MG tablet    Sig: Take 4 tablets (2,000 mg total) by mouth once.    Dispense:  4 tablet    Refill:  0

## 2012-11-05 NOTE — Patient Instructions (Signed)
Trichomoniasis °Trichomoniasis is an infection, caused by the Trichomonas organism, that affects both women and men. In women, the outer female genitalia and the vagina are affected. In men, the penis is mainly affected, but the prostate and other reproductive organs can also be involved. Trichomoniasis is a sexually transmitted disease (STD) and is most often passed to another person through sexual contact. The majority of people who get trichomoniasis do so from a sexual encounter and are also at risk for other STDs. °CAUSES  °· Sexual intercourse with an infected partner. °· It can be present in swimming pools or hot tubs. °SYMPTOMS  °· Abnormal gray-green frothy vaginal discharge in women. °· Vaginal itching and irritation in women. °· Itching and irritation of the area outside the vagina in women. °· Penile discharge with or without pain in males. °· Inflammation of the urethra (urethritis), causing painful urination. °· Bleeding after sexual intercourse. °RELATED COMPLICATIONS °· Pelvic inflammatory disease. °· Infection of the uterus (endometritis). °· Infertility. °· Tubal (ectopic) pregnancy. °· It can be associated with other STDs, including gonorrhea and chlamydia, hepatitis B, and HIV. °COMPLICATIONS DURING PREGNANCY °· Early (premature) delivery. °· Premature rupture of the membranes (PROM). °· Low birth weight. °DIAGNOSIS  °· Visualization of Trichomonas under the microscope from the vagina discharge. °· Ph of the vagina greater than 4.5, tested with a test tape. °· Trich Rapid Test. °· Culture of the organism, but this is not usually needed. °· It may be found on a Pap test. °· Having a "strawberry cervix,"which means the cervix looks very red like a strawberry. °TREATMENT  °· You may be given medication to fight the infection. Inform your caregiver if you could be or are pregnant. Some medications used to treat the infection should not be taken during pregnancy. °· Over-the-counter medications or  creams to decrease itching or irritation may be recommended. °· Your sexual partner will need to be treated if infected. °HOME CARE INSTRUCTIONS  °· Take all medication prescribed by your caregiver. °· Take over-the-counter medication for itching or irritation as directed by your caregiver. °· Do not have sexual intercourse while you have the infection. °· Do not douche or wear tampons. °· Discuss your infection with your partner, as your partner may have acquired the infection from you. Or, your partner may have been the person who transmitted the infection to you. °· Have your sex partner examined and treated if necessary. °· Practice safe, informed, and protected sex. °· See your caregiver for other STD testing. °SEEK MEDICAL CARE IF:  °· You still have symptoms after you finish the medication. °· You have an oral temperature above 102° F (38.9° C). °· You develop belly (abdominal) pain. °· You have pain when you urinate. °· You have bleeding after sexual intercourse. °· You develop a rash. °· The medication makes you sick or makes you throw up (vomit). °Document Released: 08/17/2000 Document Revised: 05/16/2011 Document Reviewed: 09/12/2008 °[employer]® Patient Information ©2014 [employer]. ° °

## 2012-11-07 LAB — GC/CHLAMYDIA PROBE AMP: CT Probe RNA: UNDETERMINED

## 2012-11-08 ENCOUNTER — Other Ambulatory Visit (INDEPENDENT_AMBULATORY_CARE_PROVIDER_SITE_OTHER): Payer: 59

## 2012-11-08 DIAGNOSIS — A64 Unspecified sexually transmitted disease: Secondary | ICD-10-CM

## 2012-11-08 NOTE — Progress Notes (Signed)
Patient here for labs only. 

## 2012-11-09 ENCOUNTER — Encounter: Payer: Self-pay | Admitting: Family Medicine

## 2012-11-13 ENCOUNTER — Telehealth: Payer: Self-pay

## 2012-11-13 NOTE — Telephone Encounter (Signed)
Patient calling about her lab results. ?

## 2012-11-14 ENCOUNTER — Encounter: Payer: Self-pay | Admitting: Family Medicine

## 2012-11-14 ENCOUNTER — Telehealth: Payer: Self-pay | Admitting: Family Medicine

## 2012-11-14 NOTE — Telephone Encounter (Signed)
Labs were reviewed and she has been sent a letter. Tests were negative.

## 2012-11-14 NOTE — Telephone Encounter (Signed)
Spoke with patient gave her results 

## 2012-12-07 ENCOUNTER — Emergency Department (INDEPENDENT_AMBULATORY_CARE_PROVIDER_SITE_OTHER)
Admission: EM | Admit: 2012-12-07 | Discharge: 2012-12-07 | Disposition: A | Discharge status: Home / Self Care | Payer: PRIVATE HEALTH INSURANCE | Source: Home / Self Care

## 2012-12-07 ENCOUNTER — Encounter (HOSPITAL_COMMUNITY): Payer: Self-pay

## 2012-12-07 DIAGNOSIS — R51 Headache: Secondary | ICD-10-CM

## 2012-12-07 DIAGNOSIS — S40012A Contusion of left shoulder, initial encounter: Secondary | ICD-10-CM

## 2012-12-07 DIAGNOSIS — S40019A Contusion of unspecified shoulder, initial encounter: Secondary | ICD-10-CM

## 2012-12-07 NOTE — ED Provider Notes (Signed)
Medical screening examination/treatment/procedure(s) were performed by resident physician or non-physician practitioner and as supervising physician I was immediately available for consultation/collaboration.   KINDL,JAMES DOUGLAS MD.   James D Kindl, MD 12/07/12 1208 
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
## 2012-12-08 ENCOUNTER — Ambulatory Visit: Payer: 59

## 2012-12-10 ENCOUNTER — Ambulatory Visit (INDEPENDENT_AMBULATORY_CARE_PROVIDER_SITE_OTHER): Payer: 59 | Admitting: Physician Assistant

## 2012-12-10 VITALS — BP 120/80 | HR 110 | Temp 98.8°F | Resp 18 | Ht 65.0 in | Wt 118.0 lb

## 2012-12-10 DIAGNOSIS — N898 Other specified noninflammatory disorders of vagina: Secondary | ICD-10-CM

## 2012-12-10 DIAGNOSIS — R350 Frequency of micturition: Secondary | ICD-10-CM

## 2012-12-10 LAB — POCT WET PREP WITH KOH
Clue Cells Wet Prep HPF POC: NEGATIVE
KOH Prep POC: NEGATIVE
RBC Wet Prep HPF POC: NEGATIVE
Yeast Wet Prep HPF POC: NEGATIVE

## 2012-12-10 LAB — POCT URINALYSIS DIPSTICK
Bilirubin, UA: NEGATIVE
Blood, UA: NEGATIVE
Glucose, UA: NEGATIVE
Ketones, UA: NEGATIVE
Leukocytes, UA: NEGATIVE
Nitrite, UA: NEGATIVE
Protein, UA: NEGATIVE
Spec Grav, UA: 1.01
Urobilinogen, UA: 0.2
pH, UA: 5.5

## 2012-12-10 LAB — POCT UA - MICROSCOPIC ONLY
Casts, Ur, LPF, POC: NEGATIVE
Crystals, Ur, HPF, POC: NEGATIVE
Mucus, UA: NEGATIVE
RBC, urine, microscopic: NEGATIVE
Yeast, UA: NEGATIVE

## 2012-12-10 LAB — POCT URINE PREGNANCY: Preg Test, Ur: NEGATIVE

## 2012-12-10 NOTE — Progress Notes (Signed)
Subjective:    Patient ID: Dawn Peters, female    DOB: November 12, 1980, 32 y.o.   MRN: [id number]  HPI    Ms. Riester-Winn is a very pleasant 32 yr old female with concern for urinary frequency over the last couple of weeks.  Feels like she can't empty her bladder.  Was diagnosed with MS in 2000.  Knows that bladder issues can be associated with MS.  Currently takes oxybutynin qhs for nocturia.  She denies dysuria or hematuria.  Denies fever, chills, NV, abd pain.  Requests a pregnancy test though she takes OCPs faithfully.  Regular periods q month.  LMP 12/02/12  Requests STI testing today as well.  Last tested about 1 month ago.  At that time +trich.  She was treated but not sure if her partner was treated.  GC/chlamydia both negative at that time.  She is active with the same partner.  They do use condoms but the condom broke, which is when she thinks she was exposed to trich.  Would like repeat testing today, "wants to be checked".   Does have some vaginal discharge but not sure if it's normal.  No dyspareunia.      Review of Systems  Constitutional: Negative for fever and chills.  HENT: Negative.   Respiratory: Negative.   Cardiovascular: Negative.   Gastrointestinal: Negative for nausea, vomiting and abdominal pain.  Genitourinary: Positive for frequency and vaginal discharge. Negative for dysuria, urgency, hematuria, flank pain and pelvic pain.  Musculoskeletal: Negative.   Skin: Negative.   Neurological: Negative.        Objective:   Physical Exam  Vitals reviewed. Constitutional: She is oriented to person, place, and time. She appears well-developed and well-nourished. No distress.  HENT:  Head: Normocephalic and atraumatic.  Eyes: Conjunctivae are normal. No scleral icterus.  Cardiovascular: Normal rate, regular rhythm and normal heart sounds.   Pulmonary/Chest: Effort normal and breath sounds normal. She has no wheezes. She has no rales.  Abdominal: Soft. Bowel sounds  are normal. There is no tenderness.  Genitourinary: Uterus normal. There is no rash, tenderness or lesion on the right labia. There is no rash, tenderness or lesion on the left labia. Cervix exhibits no motion tenderness. Right adnexum displays no mass, no tenderness and no fullness. Left adnexum displays no mass, no tenderness and no fullness. Vaginal discharge (moderate clear/yeallow discharge) found.  Neurological: She is alert and oriented to person, place, and time.  Skin: Skin is warm and dry.  Psychiatric: She has a normal mood and affect. Her behavior is normal.    Results for orders placed in visit on 12/10/12  POCT UA - MICROSCOPIC ONLY      Result Value Range   WBC, Ur, HPF, POC 2-6     RBC, urine, microscopic neg     Bacteria, U Microscopic trace     Mucus, UA neg     Epithelial cells, urine per micros 4-6     Crystals, Ur, HPF, POC neg     Casts, Ur, LPF, POC neg     Yeast, UA neg    POCT URINALYSIS DIPSTICK      Result Value Range   Color, UA yellow     Clarity, UA clear     Glucose, UA neg     Bilirubin, UA neg     Ketones, UA neg     Spec Grav, UA 1.010     Blood, UA neg     pH, UA 5.5  Protein, UA neg     Urobilinogen, UA 0.2     Nitrite, UA neg     Leukocytes, UA Negative    POCT URINE PREGNANCY      Result Value Range   Preg Test, Ur Negative    POCT WET PREP WITH KOH      Result Value Range   Trichomonas, UA Negative     Clue Cells Wet Prep HPF POC neg     Epithelial Wet Prep HPF POC 4-clumps     Yeast Wet Prep HPF POC neg     Bacteria Wet Prep HPF POC trace     RBC Wet Prep HPF POC neg     WBC Wet Prep HPF POC 0-4     KOH Prep POC Negative           Assessment & Plan:  Urine frequency - Plan: POCT UA - Microscopic Only, POCT urinalysis dipstick, POCT urine pregnancy, Urine culture  Vaginal discharge - Plan: POCT Wet Prep with KOH, GC/Chlamydia Probe Amp   Ms. Oelke-Winn is a very pleasant 32 yr old female here with several weeks of  urinary frequency.  UA is normal.  HCG is negative.  Discussed fluid management and bladder irritants.  Pt with MS x 14 yrs, previous urinary frequency.  Currently treated with oxybutynin qhs.  Pt unsure how much she takes or if it is ER or IR.  Pt will call and let me know how much medication she takes.  Will try increasing dose or increasing frequency if we can to try to improve symptom control.  Encouraged her to follow up with her neurologist.  Could also consider urology consult.  Will send urine cx to completely r/o infection.  Wet prep neg for trich, clues, yeast.  Genprobe pending.  Pt declines HIV, RPR testing today.

## 2012-12-11 ENCOUNTER — Telehealth: Payer: Self-pay | Admitting: Physician Assistant

## 2012-12-11 LAB — GC/CHLAMYDIA PROBE AMP
CT Probe RNA: NEGATIVE
GC Probe RNA: NEGATIVE

## 2012-12-11 NOTE — Telephone Encounter (Signed)
Please call pt and clarify which dose of oxybutynin pt is taking and whether it is the extended release or immediate release, so we can figure out a dosing schedule that will work for her

## 2012-12-11 NOTE — Telephone Encounter (Signed)
Called pt, she states her dosage is 5mg  immediate release.

## 2012-12-11 NOTE — Telephone Encounter (Signed)
Gave pt message and she voiced understanding.

## 2012-12-11 NOTE — Telephone Encounter (Signed)
Excellent.  Let's start using the oxybutynin twice daily (morning and night).  See if that improves day time frequency.  Continue drinking the normal amount of fluid. The medication may cause constipation, so be cautious of this.  Can try Miralax daily if constipation becomes an issue.   Consider following up with neurology as this may be related to MS.  Urine culture is still pending but I will let her know as soon as I have results

## 2012-12-11 NOTE — Telephone Encounter (Signed)
Called her to advise. Left message for her to call me back.  

## 2012-12-12 LAB — URINE CULTURE
Colony Count: NO GROWTH
Organism ID, Bacteria: NO GROWTH

## 2012-12-20 ENCOUNTER — Emergency Department (INDEPENDENT_AMBULATORY_CARE_PROVIDER_SITE_OTHER)
Admission: EM | Admit: 2012-12-20 | Discharge: 2012-12-20 | Disposition: A | Discharge status: Home / Self Care | Payer: PRIVATE HEALTH INSURANCE | Source: Home / Self Care | Attending: Family Medicine | Admitting: Family Medicine

## 2012-12-20 ENCOUNTER — Encounter (HOSPITAL_COMMUNITY): Payer: Self-pay | Admitting: Emergency Medicine

## 2012-12-20 DIAGNOSIS — S134XXA Sprain of ligaments of cervical spine, initial encounter: Secondary | ICD-10-CM

## 2012-12-20 DIAGNOSIS — S139XXA Sprain of joints and ligaments of unspecified parts of neck, initial encounter: Secondary | ICD-10-CM

## 2012-12-20 MED ORDER — ETODOLAC 500 MG PO TABS: 500.0000 mg | ORAL_TABLET | Freq: Two times a day (BID) | ORAL | Status: DC | PRN

## 2012-12-20 MED ORDER — CYCLOBENZAPRINE HCL 10 MG PO TABS: 10.0000 mg | ORAL_TABLET | Freq: Two times a day (BID) | ORAL | Status: DC | PRN
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
## 2012-12-25 NOTE — ED Provider Notes (Signed)
Medical screening examination/treatment/procedure(s) were performed by resident physician or non-physician practitioner and as supervising physician I was immediately available for consultation/collaboration.   KINDL,JAMES DOUGLAS MD.   James D Kindl, MD 12/25/12 2141 

## 2013-01-08 ENCOUNTER — Other Ambulatory Visit: Payer: Self-pay | Admitting: Specialist

## 2013-01-08 DIAGNOSIS — G35 Multiple sclerosis: Secondary | ICD-10-CM

## 2013-01-15 ENCOUNTER — Ambulatory Visit
Admission: RE | Admit: 2013-01-15 | Discharge: 2013-01-15 | Disposition: A | Discharge status: Home / Self Care | Payer: PRIVATE HEALTH INSURANCE | Source: Ambulatory Visit | Attending: Specialist | Admitting: Specialist

## 2013-01-15 DIAGNOSIS — G35 Multiple sclerosis: Secondary | ICD-10-CM

## 2013-01-15 MED ORDER — GADOBENATE DIMEGLUMINE 529 MG/ML IV SOLN
10.0000 mL | Freq: Once | INTRAVENOUS | Status: AC | PRN
  Administered 2013-01-15: 10 mL via INTRAVENOUS

## 2013-01-16 ENCOUNTER — Telehealth: Payer: Self-pay | Admitting: General Practice

## 2013-01-16 NOTE — Telephone Encounter (Signed)
Patient called and left message stating she would like a prescription for a bacterial infection she is having and she uses the [facility] on [address]. Called patient, no answer- left message stating we are trying to return your phone call, please call us back at the clinics.

## 2013-01-17 ENCOUNTER — Ambulatory Visit (INDEPENDENT_AMBULATORY_CARE_PROVIDER_SITE_OTHER): Payer: 59 | Admitting: Emergency Medicine

## 2013-01-17 VITALS — BP 112/62 | HR 102 | Temp 98.2°F | Resp 18 | Ht 65.5 in | Wt 122.8 lb

## 2013-01-17 DIAGNOSIS — N898 Other specified noninflammatory disorders of vagina: Secondary | ICD-10-CM

## 2013-01-17 DIAGNOSIS — A599 Trichomoniasis, unspecified: Secondary | ICD-10-CM

## 2013-01-17 LAB — POCT WET PREP WITH KOH

## 2013-01-17 MED ORDER — METRONIDAZOLE 500 MG PO TABS: 500.0000 mg | ORAL_TABLET | Freq: Two times a day (BID) | ORAL | Status: DC

## 2013-01-17 NOTE — Telephone Encounter (Signed)
Called pt and informed pt that she has not been here since 11/2011 and that she would need to make an appt so that she can be evaluated.  Pt informed me that she was currently at Urgent Care and they are treating her now.  I advised pt to call the office and make an annual appt.  Pt stated understanding.

## 2013-01-17 NOTE — Patient Instructions (Signed)
Trichomoniasis °Trichomoniasis is an infection, caused by the Trichomonas organism, that affects both women and men. In women, the outer female genitalia and the vagina are affected. In men, the penis is mainly affected, but the prostate and other reproductive organs can also be involved. Trichomoniasis is a sexually transmitted disease (STD) and is most often passed to another person through sexual contact. The majority of people who get trichomoniasis do so from a sexual encounter and are also at risk for other STDs. °CAUSES  °· Sexual intercourse with an infected partner. °· It can be present in swimming pools or hot tubs. °SYMPTOMS  °· Abnormal gray-green frothy vaginal discharge in women. °· Vaginal itching and irritation in women. °· Itching and irritation of the area outside the vagina in women. °· Penile discharge with or without pain in males. °· Inflammation of the urethra (urethritis), causing painful urination. °· Bleeding after sexual intercourse. °RELATED COMPLICATIONS °· Pelvic inflammatory disease. °· Infection of the uterus (endometritis). °· Infertility. °· Tubal (ectopic) pregnancy. °· It can be associated with other STDs, including gonorrhea and chlamydia, hepatitis B, and HIV. °COMPLICATIONS DURING PREGNANCY °· Early (premature) delivery. °· Premature rupture of the membranes (PROM). °· Low birth weight. °DIAGNOSIS  °· Visualization of Trichomonas under the microscope from the vagina discharge. °· Ph of the vagina greater than 4.5, tested with a test tape. °· Trich Rapid Test. °· Culture of the organism, but this is not usually needed. °· It may be found on a Pap test. °· Having a "strawberry cervix,"which means the cervix looks very red like a strawberry. °TREATMENT  °· You may be given medication to fight the infection. Inform your caregiver if you could be or are pregnant. Some medications used to treat the infection should not be taken during pregnancy. °· Over-the-counter medications or  creams to decrease itching or irritation may be recommended. °· Your sexual partner will need to be treated if infected. °HOME CARE INSTRUCTIONS  °· Take all medication prescribed by your caregiver. °· Take over-the-counter medication for itching or irritation as directed by your caregiver. °· Do not have sexual intercourse while you have the infection. °· Do not douche or wear tampons. °· Discuss your infection with your partner, as your partner may have acquired the infection from you. Or, your partner may have been the person who transmitted the infection to you. °· Have your sex partner examined and treated if necessary. °· Practice safe, informed, and protected sex. °· See your caregiver for other STD testing. °SEEK MEDICAL CARE IF:  °· You still have symptoms after you finish the medication. °· You have an oral temperature above 102° F (38.9° C). °· You develop belly (abdominal) pain. °· You have pain when you urinate. °· You have bleeding after sexual intercourse. °· You develop a rash. °· The medication makes you sick or makes you throw up (vomit). °Document Released: 08/17/2000 Document Revised: 05/16/2011 Document Reviewed: 09/12/2008 °[employer]® Patient Information ©2014 [employer]. ° °

## 2013-01-17 NOTE — Progress Notes (Addendum)
This chart was scribed for Lesle Chris, MD by Ardelia Mems, Scribe. This patient was seen in room 13 and the patient's care was started at 9:50 AM.  Subjective:    Patient ID: Dawn Peters, female    DOB: 08/29/1980, 32 y.o.   MRN: [id number]  Chief Complaint  Patient presents with  . Vaginal Discharge    started 3 days ago     HPI  HPI Comments: Dawn Peters is a 32 y.o. female with a history of trichomoniasis, MS and HTN who presents to [facility] complaining of vaginal discharge over the past 4 days. She states that the discharge has a slightly foul odor. She states that she has a history of similar discharge a couple of months ago. She was seen here on 11/05/12 and found to have frothy green vaginal discharge. She ws diagnosed with trichomoniasis and treated with Diflucan. She states that she the Diflucan offered relief of her discharge. She has had negative GC/Chlamydia tests in recent months..   Past Medical History  Diagnosis Date  . MS (multiple sclerosis)   . Hypertension    Current Outpatient Prescriptions on File Prior to Visit  Medication Sig Dispense Refill  . baclofen (LIORESAL) 20 MG tablet Take 20 mg by mouth 3 (three) times daily as needed (for ms).       . cyclobenzaprine (FLEXERIL) 10 MG tablet Take 1 tablet (10 mg total) by mouth 3 (three) times daily as needed for muscle spasms.  20 tablet  0  . cyclobenzaprine (FLEXERIL) 10 MG tablet Take 1 tablet (10 mg total) by mouth 2 (two) times daily as needed for muscle spasms.  20 tablet  0  . gabapentin (NEURONTIN) 800 MG tablet Take 800 mg by mouth 3 (three) times daily.      Marland Kitchen lisinopril (PRINIVIL,ZESTRIL) 20 MG tablet Take 20 mg by mouth daily.      . Multiple Vitamin (MULTIVITAMIN WITH MINERALS) TABS Take 1 tablet by mouth daily.      . norethindrone (CAMILA) 0.35 MG tablet Take 1 tablet (0.35 mg total) by mouth daily.  1 Package  3  . docusate sodium (COLACE) 100 MG capsule Take 200  mg by mouth daily as needed for constipation.      Marland Kitchen etodolac (LODINE) 500 MG tablet Take 1 tablet (500 mg total) by mouth 2 (two) times daily as needed.  30 tablet  1  . ipratropium (ATROVENT) 0.06 % nasal spray Place 2 sprays into the nose 4 (four) times daily as needed for rhinitis.      Marland Kitchen ketoconazole (NIZORAL) 2 % cream Apply topically daily.  75 g  1  . LORazepam (ATIVAN) 1 MG tablet Take 1 tablet (1 mg total) by mouth daily as needed for anxiety. May take at onset of panic attack  20 tablet  0  . metroNIDAZOLE (FLAGYL) 500 MG tablet Take 4 tablets (2,000 mg total) by mouth once.  4 tablet  0   No current facility-administered medications on file prior to visit.   No Known Allergies   Review of Systems  Genitourinary: Positive for vaginal discharge.      BP 112/62  Pulse 102  Temp(Src) 98.2 F (36.8 C) (Oral)  Resp 18  Ht 5' 5.5" (1.664 m)  Wt 122 lb 12.8 oz (55.702 kg)  BMI 20.12 kg/m2  SpO2 100%  LMP 01/10/2013 Objective:   Physical Exam  CONSTITUTIONAL: Well developed/well nourished HEAD: Normocephalic/atraumatic EYES: EOMI/PERRL ENMT: Mucous membranes  moist NECK: supple no meningeal signs SPINE:entire spine nontender CV: S1/S2 noted, no murmurs/rubs/gallops noted LUNGS: Lungs are clear to auscultation bilaterally, no apparent distress ABDOMEN: soft, nontender, no rebound or guarding GU: There is a greenish yellow discharge in the vaginal vault. I was unable to visualize the cervix. There is no tenderness in the adnexa. NEURO: Pt is awake/alert, moves all extremitiesx4 EXTREMITIES: pulses normal, full ROM SKIN: warm, color normal PSYCH: no abnormalities of mood noted Results for orders placed in visit on 01/17/13  POCT WET PREP WITH KOH      Result Value Range   Trichomonas, UA Positive     Clue Cells Wet Prep HPF POC 1-3     Epithelial Wet Prep HPF POC 2-4     Yeast Wet Prep HPF POC neg     Bacteria Wet Prep HPF POC 2+     RBC Wet Prep HPF POC 3-5     WBC  Wet Prep HPF POC 25-30     KOH Prep POC Negative        Assessment & Plan:  Patient treated with Flagyl 500 twice a day for 10 days she is to come back in 2 weeks for repeat examination to be sure she is clear.   I personally performed the services described in this documentation, which was scribed in my presence. The recorded information has been reviewed and is accurate. She had HIV and syphilis test done through the health dept.

## 2013-01-18 LAB — GC/CHLAMYDIA PROBE AMP: CT Probe RNA: NEGATIVE

## 2013-02-11 ENCOUNTER — Telehealth: Payer: Self-pay

## 2013-02-11 NOTE — Telephone Encounter (Signed)
Pt seen recently for anxiety, would like refill of Lorazepam  [facility]  Pt [phone]--ok to lve a msg per pt

## 2013-02-11 NOTE — Telephone Encounter (Signed)
Please advise this patient that she should come in to discuss this medication refill and treatment options for her anxiety/panic.

## 2013-02-11 NOTE — Telephone Encounter (Signed)
She was seen for anxiety in July, please advise on refill of Lorazepam

## 2013-02-12 NOTE — Telephone Encounter (Signed)
I have called her to advise. Left message as she had requested.

## 2013-02-22 ENCOUNTER — Other Ambulatory Visit: Payer: Self-pay | Admitting: Orthopedic Surgery

## 2013-02-22 DIAGNOSIS — M25551 Pain in right hip: Secondary | ICD-10-CM

## 2013-02-22 DIAGNOSIS — M25552 Pain in left hip: Secondary | ICD-10-CM

## 2013-02-27 ENCOUNTER — Ambulatory Visit
Admission: RE | Admit: 2013-02-27 | Discharge: 2013-02-27 | Disposition: A | Discharge status: Home / Self Care | Payer: PRIVATE HEALTH INSURANCE | Source: Ambulatory Visit | Attending: Orthopedic Surgery | Admitting: Orthopedic Surgery

## 2013-02-27 DIAGNOSIS — M25551 Pain in right hip: Secondary | ICD-10-CM

## 2013-02-27 DIAGNOSIS — M25552 Pain in left hip: Secondary | ICD-10-CM

## 2013-03-12 ENCOUNTER — Other Ambulatory Visit: Payer: Self-pay | Admitting: Orthopedic Surgery

## 2013-03-12 DIAGNOSIS — M25552 Pain in left hip: Secondary | ICD-10-CM

## 2013-03-17 ENCOUNTER — Ambulatory Visit
Admission: RE | Admit: 2013-03-17 | Discharge: 2013-03-17 | Disposition: A | Discharge status: Home / Self Care | Payer: PRIVATE HEALTH INSURANCE | Source: Ambulatory Visit | Attending: Orthopedic Surgery | Admitting: Orthopedic Surgery

## 2013-03-17 DIAGNOSIS — M25552 Pain in left hip: Secondary | ICD-10-CM

## 2013-05-27 ENCOUNTER — Ambulatory Visit: Payer: PRIVATE HEALTH INSURANCE | Admitting: Physical Therapy

## 2013-05-31 ENCOUNTER — Ambulatory Visit: Payer: PRIVATE HEALTH INSURANCE | Attending: Internal Medicine | Admitting: Internal Medicine

## 2013-05-31 VITALS — BP 155/105 | HR 88 | Temp 97.9°F | Resp 17 | Ht 65.0 in | Wt 124.0 lb

## 2013-05-31 DIAGNOSIS — G35 Multiple sclerosis: Secondary | ICD-10-CM

## 2013-05-31 DIAGNOSIS — R300 Dysuria: Secondary | ICD-10-CM

## 2013-05-31 LAB — COMPLETE METABOLIC PANEL WITH GFR
ALBUMIN: 4.3 g/dL (ref 3.5–5.2)
ALK PHOS: 42 U/L (ref 39–117)
ALT: 10 U/L (ref 0–35)
AST: 15 U/L (ref 0–37)
BILIRUBIN TOTAL: 0.9 mg/dL (ref 0.2–1.2)
BUN: 15 mg/dL (ref 6–23)
CO2: 30 mEq/L (ref 19–32)
Calcium: 9.6 mg/dL (ref 8.4–10.5)
Chloride: 99 mEq/L (ref 96–112)
Creat: 0.81 mg/dL (ref 0.50–1.10)
GFR, Est African American: >89 mL/min
GFR, Est Non African American: >89 mL/min
Glucose, Bld: 86 mg/dL (ref 70–99)
POTASSIUM: 4.1 meq/L (ref 3.5–5.3)
SODIUM: 139 meq/L (ref 135–145)
TOTAL PROTEIN: 6.8 g/dL (ref 6.0–8.3)

## 2013-05-31 LAB — POCT URINALYSIS DIPSTICK
BILIRUBIN UA: NEGATIVE
Glucose, UA: NEGATIVE
NITRITE UA: NEGATIVE
Protein, UA: 30
RBC UA: NEGATIVE
Spec Grav, UA: 1.02
Urobilinogen, UA: 1
pH, UA: 7.5

## 2013-05-31 LAB — CBC WITH DIFFERENTIAL/PLATELET
BASOS ABS: 0 10*3/uL (ref 0.0–0.1)
BASOS PCT: 0 % (ref 0–1)
Eosinophils Absolute: 0 10*3/uL (ref 0.0–0.7)
Eosinophils Relative: 0 % (ref 0–5)
HCT: 41.4 % (ref 36.0–46.0)
Hemoglobin: 14.6 g/dL (ref 12.0–15.0)
Lymphocytes Relative: 15 % (ref 12–46)
Lymphs Abs: 0.7 10*3/uL (ref 0.7–4.0)
MCH: 34 pg (ref 26.0–34.0)
MCHC: 35.3 g/dL (ref 30.0–36.0)
MCV: 96.5 fL (ref 78.0–100.0)
Monocytes Absolute: 0.4 10*3/uL (ref 0.1–1.0)
Monocytes Relative: 8 % (ref 3–12)
NEUTROS ABS: 3.8 10*3/uL (ref 1.7–7.7)
Neutrophils Relative %: 77 % (ref 43–77)
PLATELETS: 259 10*3/uL (ref 150–400)
RBC: 4.29 MIL/uL (ref 3.87–5.11)
RDW: 13.8 % (ref 11.5–15.5)
WBC: 4.9 10*3/uL (ref 4.0–10.5)

## 2013-05-31 LAB — POCT URINE PREGNANCY: Preg Test, Ur: NEGATIVE

## 2013-05-31 MED ORDER — NYSTATIN 100000 UNIT/GM EX POWD: Freq: Four times a day (QID) | CUTANEOUS | Status: DC

## 2013-05-31 NOTE — Progress Notes (Signed)
Patient ID: Dawn Peters, female   DOB: 06/23/1980, 33 y.o.   MRN: [id number]   CC:  HPI: 33 year old female here to establish care. She has a history of multiple sclerosis and hypertension. Blood pressure is 737 systolic today. She states that she usually runs higher in the doctor's office. Asymptomatic with no complaints. 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 She was diagnosed with multiple sclerosis at age 46 and has a history of being on chronic steroids. She is followed by Dr. Alvan Dame orthopedics  Her multiple sclerosis is monitored by Dr. Starleen Blue in [place]. She currently does not report any symptoms, no numbness tingling blurry vision. She does have some increased urinary frequency for which she is on vesicare.  She is requesting a urine pregnancy test and to rule out UTI today because of her increased urinary frequency. She has been on Camila birth control, prescribed to her by [facility]. [facility]   Social history nonsmoker nonalcoholic  Family history positive for diabetes and hypertension in the mother and grandmother One cousin has Crohn's disease   No Known Allergies Past Medical History  Diagnosis Date  . MS (multiple sclerosis)   . Hypertension    Current Outpatient Prescriptions on File Prior to Visit  Medication Sig Dispense Refill  . baclofen (LIORESAL) 20 MG tablet Take 20 mg by mouth 3 (three) times daily as needed (for ms).       . cyclobenzaprine (FLEXERIL) 10 MG tablet Take 1 tablet (10 mg total) by mouth 3 (three) times daily as needed for muscle spasms.  20 tablet  0  . gabapentin (NEURONTIN) 800 MG tablet Take 800 mg by mouth 3 (three) times daily.      Marland Kitchen lisinopril (PRINIVIL,ZESTRIL) 20 MG tablet Take 20 mg by mouth daily.      . Multiple Vitamin (MULTIVITAMIN WITH MINERALS) TABS Take 1 tablet by mouth daily.      .  norethindrone (CAMILA) 0.35 MG tablet Take 1 tablet (0.35 mg total) by mouth daily.  1 Package  3  . cyclobenzaprine (FLEXERIL) 10 MG tablet Take 1 tablet (10 mg total) by mouth 2 (two) times daily as needed for muscle spasms.  20 tablet  0  . docusate sodium (COLACE) 100 MG capsule Take 200 mg by mouth daily as needed for constipation.      Marland Kitchen etodolac (LODINE) 500 MG tablet Take 1 tablet (500 mg total) by mouth 2 (two) times daily as needed.  30 tablet  1  . ipratropium (ATROVENT) 0.06 % nasal spray Place 2 sprays into the nose 4 (four) times daily as needed for rhinitis.      Marland Kitchen ketoconazole (NIZORAL) 2 % cream Apply topically daily.  75 g  1  . LORazepam (ATIVAN) 1 MG tablet Take 1 tablet (1 mg total) by mouth daily as needed for anxiety. May take at onset of panic attack  20 tablet  0  . metroNIDAZOLE (FLAGYL) 500 MG tablet Take 4 tablets (2,000 mg total) by mouth once.  4 tablet  0  . metroNIDAZOLE (FLAGYL) 500 MG tablet Take 1 tablet (500 mg total) by mouth 2 (two) times daily.  20 tablet  0   No current facility-administered medications on file prior to visit.   History reviewed. No pertinent family history. History   Social History  . Marital Status: Single    Spouse Name: N/A  Number of Children: N/A  . Years of Education: N/A   Occupational History  . Not on file.   Social History Main Topics  . Smoking status: Never Smoker   . Smokeless tobacco: Never Used  . Alcohol Use: No  . Drug Use: No  . Sexual Activity: Not Currently    Birth Control/ Protection: Pill   Other Topics Concern  . Not on file   Social History Narrative  . No narrative on file    Review of Systems  Constitutional: Negative for fever, chills, diaphoresis, activity change, appetite change and fatigue.  HENT: Negative for ear pain, nosebleeds, congestion, facial swelling, rhinorrhea, neck pain, neck stiffness and ear discharge.   Eyes: Negative for pain, discharge, redness, itching and visual  disturbance.  Respiratory: Negative for cough, choking, chest tightness, shortness of breath, wheezing and stridor.   Cardiovascular: Negative for chest pain, palpitations and leg swelling.  Gastrointestinal: Negative for abdominal distention.  Genitourinary: Negative for dysuria, urgency, frequency, hematuria, flank pain, decreased urine volume, difficulty urinating and dyspareunia.  Musculoskeletal: Negative for back pain, joint swelling, arthralgias and gait problem.  Neurological: Negative for dizziness, tremors, seizures, syncope, facial asymmetry, speech difficulty, weakness, light-headedness, numbness and headaches.  Hematological: Negative for adenopathy. Does not bruise/bleed easily.  Psychiatric/Behavioral: Negative for hallucinations, behavioral problems, confusion, dysphoric mood, decreased concentration and agitation.    Objective:   Filed Vitals:   05/31/13 0950  BP: 155/105  Pulse: 88  Temp: 97.9 F (36.6 C)  Resp: 17    Physical Exam  Constitutional: Appears well-developed and well-nourished. No distress.  HENT: Normocephalic. External right and left ear normal. Oropharynx is clear and moist.  Eyes: Conjunctivae and EOM are normal. PERRLA, no scleral icterus.  Neck: Normal ROM. Neck supple. No JVD. No tracheal deviation. No thyromegaly.  CVS: RRR, S1/S2 +, no murmurs, no gallops, no carotid bruit.  Pulmonary: Effort and breath sounds normal, no stridor, rhonchi, wheezes, rales.  Abdominal: Soft. BS +,  no distension, tenderness, rebound or guarding.  Musculoskeletal: Normal range of motion. No edema and no tenderness.  Lymphadenopathy: No lymphadenopathy noted, cervical, inguinal. Neuro: Alert. Normal reflexes, muscle tone coordination. No cranial nerve deficit. Skin: Skin is warm and dry. No rash noted. Not diaphoretic. No erythema. No pallor.  Psychiatric: Normal mood and affect. Behavior, judgment, thought content normal.   Lab Results  Component Value Date    WBC 4.1 07/28/2012   HGB 16.1* 07/28/2012   HCT 44.1 07/28/2012   MCV 92.8 07/28/2012   PLT 206 07/28/2012   Lab Results  Component Value Date   CREATININE 0.88 11/05/2012   BUN 16 11/05/2012   NA 141 11/05/2012   K 4.0 11/05/2012   CL 104 11/05/2012   CO2 29 11/05/2012    Lab Results  Component Value Date   HGBA1C 4.7 11/05/2012   Lipid Panel  No results found for this basename: chol, trig, hdl, cholhdl, vldl, ldlcalc       Assessment and plan:   Patient Active Problem List   Diagnosis Date Noted  . Oral contraceptive use 10/20/2011  . MULTIPLE SCLEROSIS 08/08/2007  . RASH AND OTHER NONSPECIFIC SKIN ERUPTION 06/22/2007  . AVASCULAR NECROSIS, FEMORAL HEAD 03/11/2006  . HYPERTENSION 02/03/2006  . 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 DVT, HX OF 02/03/2006   Multiple sclerosis  Continue gabapentin and baclofen Continue to follow with Dr. Starleen Blue    Hypertension continue lisinopril Patient claims that her blood pressure is normal between Dr. visits  Dysuria Complaining of increased frequency Rule out UTI Per patient request rule out pregnancy   Establish care Patient up-to-date with her immunization for flu vaccination Pap smear done last year by [facility]   Nystatin powder for her feet because of some maceration of the skin between her feet Requested her to keep the feet dry before wearing socks and shoes  Patient to follow up in 3 months    The patient was given clear instructions to go to ER or return to medical center if symptoms don't improve, worsen or new problems develop. The patient verbalized understanding. The patient was told to call to get any lab results if not heard anything in the next week.

## 2013-05-31 NOTE — Progress Notes (Signed)
Pt is here to establish care. Pt is requesting a referral to physical therapy. Pt has a history of MS and HTN.

## 2013-06-01 LAB — T4, FREE: FREE T4: 1.4 ng/dL (ref 0.80–1.80)

## 2013-06-01 LAB — TSH: TSH: 1.119 u[IU]/mL (ref 0.350–4.500)

## 2013-06-03 ENCOUNTER — Ambulatory Visit: Payer: PRIVATE HEALTH INSURANCE | Attending: Physical Medicine and Rehabilitation | Admitting: Physical Therapy

## 2013-06-03 DIAGNOSIS — I10 Essential (primary) hypertension: Secondary | ICD-10-CM | POA: Insufficient documentation

## 2013-06-03 DIAGNOSIS — G35 Multiple sclerosis: Secondary | ICD-10-CM | POA: Insufficient documentation

## 2013-06-03 DIAGNOSIS — IMO0001 Reserved for inherently not codable concepts without codable children: Secondary | ICD-10-CM | POA: Insufficient documentation

## 2013-06-04 ENCOUNTER — Telehealth: Payer: Self-pay | Admitting: Emergency Medicine

## 2013-06-04 NOTE — Telephone Encounter (Signed)
Message copied by Ricci Barker on Tue Jun 04, 2013  5:41 PM ------      Message from: Allyson Sabal MD, Ascencion Dike      Created: Mon Jun 03, 2013  2:14 PM       All Labs normal, notify patient ------

## 2013-06-04 NOTE — Telephone Encounter (Signed)
Pt given blood results

## 2013-06-06 ENCOUNTER — Ambulatory Visit: Payer: PRIVATE HEALTH INSURANCE | Attending: Physical Medicine and Rehabilitation | Admitting: Physical Therapy

## 2013-06-06 DIAGNOSIS — G35 Multiple sclerosis: Secondary | ICD-10-CM | POA: Diagnosis not present

## 2013-06-06 DIAGNOSIS — I10 Essential (primary) hypertension: Secondary | ICD-10-CM | POA: Diagnosis not present

## 2013-06-06 DIAGNOSIS — IMO0001 Reserved for inherently not codable concepts without codable children: Secondary | ICD-10-CM | POA: Diagnosis present

## 2013-06-10 ENCOUNTER — Telehealth: Payer: Self-pay | Admitting: Internal Medicine

## 2013-06-10 ENCOUNTER — Ambulatory Visit (INDEPENDENT_AMBULATORY_CARE_PROVIDER_SITE_OTHER): Payer: 59 | Admitting: Family Medicine

## 2013-06-10 VITALS — BP 102/66 | HR 88 | Temp 98.1°F | Resp 18 | Ht 65.0 in | Wt 120.0 lb

## 2013-06-10 DIAGNOSIS — F411 Generalized anxiety disorder: Secondary | ICD-10-CM

## 2013-06-10 MED ORDER — LORAZEPAM 1 MG PO TABS: 1.0000 mg | ORAL_TABLET | Freq: Every day | ORAL | Status: DC | PRN

## 2013-06-10 NOTE — Progress Notes (Signed)
Dawn Peters is a 33 y.o. female who presents to Urgent Care today with complaints of panic attacks:  1.  Panic attacks:  PMH of panic attacks which started last year in 2022-10-01 after the death of one of her friends.  Prescribed Ativan which helped at that time, once daily.  No further attacks from Oct 01, 2022 until December -- had an attack at Christmas.  No meds at that time, didn't need them, resolved on its own.    Yesterday was at church during [employer] and had attack.  Described palpitations, sense of impending doom, dyspnea.  Lasted about 15 minutes and resolved with walking around and drinking some water, eating a banana, deep breathing.    Presents today without any further attacks.  No prior triggers to attack.  No history of depression or anxiety.  MS diagnosis since 2000.    PMH reviewed.  Past Medical History  Diagnosis Date  . MS (multiple sclerosis)   . Hypertension   . Anxiety    Past Surgical History  Procedure Laterality Date  . Bilateral hip arthroscopy    . Joint replacement      Medications reviewed. Current Outpatient Prescriptions  Medication Sig Dispense Refill  . baclofen (LIORESAL) 20 MG tablet Take 20 mg by mouth 3 (three) times daily as needed (for ms).       . docusate sodium (COLACE) 100 MG capsule Take 200 mg by mouth daily as needed for constipation.      . gabapentin (NEURONTIN) 800 MG tablet Take 800 mg by mouth 3 (three) times daily.      Marland Kitchen ipratropium (ATROVENT) 0.06 % nasal spray Place 2 sprays into the nose 4 (four) times daily as needed for rhinitis.      Marland Kitchen lisinopril (PRINIVIL,ZESTRIL) 20 MG tablet Take 20 mg by mouth daily.      Marland Kitchen LORazepam (ATIVAN) 1 MG tablet Take 1 tablet (1 mg total) by mouth daily as needed for anxiety. May take at onset of panic attack  20 tablet  0  . Multiple Vitamin (MULTIVITAMIN WITH MINERALS) TABS Take 1 tablet by mouth daily.      . norethindrone (CAMILA) 0.35 MG tablet Take 1 tablet (0.35 mg total) by mouth  daily.  1 Package  3  . nystatin (MYCOSTATIN) powder Apply topically 4 (four) times daily. To be used on both feet twice a day  60 g  0  . cyclobenzaprine (FLEXERIL) 10 MG tablet Take 1 tablet (10 mg total) by mouth 3 (three) times daily as needed for muscle spasms.  20 tablet  0  . cyclobenzaprine (FLEXERIL) 10 MG tablet Take 1 tablet (10 mg total) by mouth 2 (two) times daily as needed for muscle spasms.  20 tablet  0  . etodolac (LODINE) 500 MG tablet Take 1 tablet (500 mg total) by mouth 2 (two) times daily as needed.  30 tablet  1  . ketoconazole (NIZORAL) 2 % cream Apply topically daily.  75 g  1  . metroNIDAZOLE (FLAGYL) 500 MG tablet Take 4 tablets (2,000 mg total) by mouth once.  4 tablet  0  . metroNIDAZOLE (FLAGYL) 500 MG tablet Take 1 tablet (500 mg total) by mouth 2 (two) times daily.  20 tablet  0   No current facility-administered medications for this visit.    ROS as above otherwise neg.    Physical Exam:  BP 102/66  Pulse 88  Temp(Src) 98.1 F (36.7 C)  Resp 18  Ht 5\' 5"  (  1.651 m)  Wt 120 lb (54.432 kg)  BMI 19.97 kg/m2  SpO2 100%  LMP 05/04/2013 Gen:  Alert, cooperative patient who appears stated age in no acute distress.  Vital signs reviewed. HEENT: EOMI,  MMM Neck:  No LAD Pulm:  Clear to auscultation bilaterally  Cardiac:  Regular rate and rhythm w Psych:  Cooperative, smiling, conversant.  Not depressed or anxious appearing.  [place]:  No focal deficits noted  Assessment and Plan:  1.  Anxiety attack: - negative labs at PCP office last week.  - no identified trigger.  No other evidence of mood disorder.  - discussed if these become more frequent, may need long-acting plus psych referral - refill for Lorazepam today -- no refills needed since July 2014 - safety plan discussed with paitent with friends/family she can call if anxiety attacks return.

## 2013-06-10 NOTE — Patient Instructions (Signed)
It was good to meet you today.  As we discussed, I'm not sure what is triggering your attacks.    If they become more frequent, let us know or your PCP know.  We can start a long-acting medicine to prevent them and refer you to a psychologist.  Panic Attacks Panic attacks are sudden, short-livedsurges of severe anxiety, fear, or discomfort. They may occur for no reason when you are relaxed, when you are anxious, or when you are sleeping. Panic attacks may occur for a number of reasons:   Healthy people occasionally have panic attacks in extreme, life-threatening situations, such as war or natural disasters. Normal anxiety is a protective mechanism of the body that helps [place] react to danger (fight or flight response).  Panic attacks are often seen with anxiety disorders, such as panic disorder, social anxiety disorder, generalized anxiety disorder, and phobias. Anxiety disorders cause excessive or uncontrollable anxiety. They may interfere with your relationships or other life activities.  Panic attacks are sometimes seen with other mental illnesses such as depression and posttraumatic stress disorder.  Certain medical conditions, prescription medicines, and drugs of abuse can cause panic attacks. SYMPTOMS  Panic attacks start suddenly, peak within 20 minutes, and are accompanied by four or more of the following symptoms:  Pounding heart or fast heart rate (palpitations).  Sweating.  Trembling or shaking.  Shortness of breath or feeling smothered.  Feeling choked.  Chest pain or discomfort.  Nausea or strange feeling in your stomach.  Dizziness, lightheadedness, or feeling like you will faint.  Chills or hot flushes.  Numbness or tingling in your lips or hands and feet.  Feeling that things are not real or feeling that you are not yourself.  Fear of losing control or going crazy.  Fear of dying. Some of these symptoms can mimic serious medical conditions. For example, you  may think you are having a heart attack. Although panic attacks can be very scary, they are not life threatening. DIAGNOSIS  Panic attacks are diagnosed through an assessment by your health care provider. Your health care provider will ask questions about your symptoms, such as where and when they occurred. Your health care provider will also ask about your medical history and use of alcohol and drugs, including prescription medicines. Your health care provider may order blood tests or other studies to rule out a serious medical condition. Your health care provider may refer you to a mental health professional for further evaluation. TREATMENT   Most healthy people who have one or two panic attacks in an extreme, life-threatening situation will not require treatment.  The treatment for panic attacks associated with anxiety disorders or other mental illness typically involves counseling with a mental health professional, medicine, or a combination of both. Your health care provider will help determine what treatment is best for you.  Panic attacks due to physical illness usually goes away with treatment of the illness. If prescription medicine is causing panic attacks, talk with your health care provider about stopping the medicine, decreasing the dose, or substituting another medicine.  Panic attacks due to alcohol or drug abuse goes away with abstinence. Some adults need professional help in order to stop drinking or using drugs. HOME CARE INSTRUCTIONS   Take all your medicines as prescribed.   Check with your health care provider before starting new prescription or over-the-counter medicines.  Keep all follow up appointments with your health care provider. SEEK MEDICAL CARE IF:  You are not able to take  your medicines as prescribed.  Your symptoms do not improve or get worse. SEEK IMMEDIATE MEDICAL CARE IF:   You experience panic attack symptoms that are different than your usual  symptoms.  You have serious thoughts about hurting yourself or others.  You are taking medicine for panic attacks and have a serious side effect. MAKE SURE YOU:  Understand these instructions.  Will watch your condition.  Will get help right away if you are not doing well or get worse. Document Released: 02/21/2005 Document Revised: 12/12/2012 Document Reviewed: 10/05/2012 [facility] Patient Information 2014 [place].

## 2013-06-10 NOTE — Telephone Encounter (Signed)
During last visit pt was told to check in if she had another panic attack.  Pt had panic attack on Sunday, 06/09/13 and wanted to check in with Dr/Nurse. Please f/u with pt.

## 2013-06-11 ENCOUNTER — Ambulatory Visit: Payer: PRIVATE HEALTH INSURANCE | Admitting: Rehabilitation

## 2013-06-13 ENCOUNTER — Ambulatory Visit: Payer: PRIVATE HEALTH INSURANCE | Attending: Physical Medicine and Rehabilitation | Admitting: Rehabilitation

## 2013-06-13 DIAGNOSIS — M25559 Pain in unspecified hip: Secondary | ICD-10-CM | POA: Diagnosis not present

## 2013-06-13 DIAGNOSIS — Z5189 Encounter for other specified aftercare: Secondary | ICD-10-CM | POA: Insufficient documentation

## 2013-06-13 DIAGNOSIS — M6281 Muscle weakness (generalized): Secondary | ICD-10-CM | POA: Diagnosis not present

## 2013-06-13 DIAGNOSIS — R262 Difficulty in walking, not elsewhere classified: Secondary | ICD-10-CM | POA: Diagnosis not present

## 2013-06-17 ENCOUNTER — Encounter: Payer: PRIVATE HEALTH INSURANCE | Admitting: Physical Therapy

## 2013-06-18 ENCOUNTER — Ambulatory Visit: Payer: PRIVATE HEALTH INSURANCE | Admitting: Physical Therapy

## 2013-06-18 DIAGNOSIS — Z5189 Encounter for other specified aftercare: Secondary | ICD-10-CM | POA: Diagnosis not present

## 2013-06-20 ENCOUNTER — Ambulatory Visit: Payer: PRIVATE HEALTH INSURANCE | Admitting: Physical Therapy

## 2013-06-20 DIAGNOSIS — Z5189 Encounter for other specified aftercare: Secondary | ICD-10-CM | POA: Diagnosis not present

## 2013-07-05 ENCOUNTER — Encounter (HOSPITAL_COMMUNITY): Payer: Self-pay | Admitting: Emergency Medicine

## 2013-07-05 ENCOUNTER — Emergency Department (HOSPITAL_COMMUNITY)
Admission: EM | Admit: 2013-07-05 | Discharge: 2013-07-06 | Disposition: A | Discharge status: Home / Self Care | Payer: PRIVATE HEALTH INSURANCE | Attending: Emergency Medicine | Admitting: Emergency Medicine

## 2013-07-05 DIAGNOSIS — Z9889 Other specified postprocedural states: Secondary | ICD-10-CM

## 2013-07-05 DIAGNOSIS — G8918 Other acute postprocedural pain: Secondary | ICD-10-CM | POA: Insufficient documentation

## 2013-07-05 DIAGNOSIS — F411 Generalized anxiety disorder: Secondary | ICD-10-CM | POA: Insufficient documentation

## 2013-07-05 DIAGNOSIS — M7989 Other specified soft tissue disorders: Secondary | ICD-10-CM

## 2013-07-05 DIAGNOSIS — I10 Essential (primary) hypertension: Secondary | ICD-10-CM | POA: Insufficient documentation

## 2013-07-05 DIAGNOSIS — Z79899 Other long term (current) drug therapy: Secondary | ICD-10-CM | POA: Insufficient documentation

## 2013-07-05 DIAGNOSIS — Z86718 Personal history of other venous thrombosis and embolism: Secondary | ICD-10-CM | POA: Insufficient documentation

## 2013-07-05 NOTE — ED Notes (Signed)
Patient states that following an exploratory procedure today of her left hip she notice increased swelling in the hip. She has had 2 hip replacements in the past and this swelling is more than those more evasive procedures. She also states that since the procedure she has had increased abdominal distention, discomfort and difficulty emptying her bladder. She denies pain of the abdomen and the left hip is still somewhat numb. Just the swelling was concerning.

## 2013-07-05 NOTE — ED Notes (Signed)
Bed: VF64 Expected date:  Expected time:  Means of arrival:  Comments: Bed 4, EMS, 77 F, Post Surgical Complications

## 2013-07-06 ENCOUNTER — Emergency Department (HOSPITAL_COMMUNITY)
Admission: EM | Admit: 2013-07-06 | Discharge: 2013-07-06 | Disposition: A | Discharge status: Home / Self Care | Payer: PRIVATE HEALTH INSURANCE | Source: Home / Self Care | Attending: Emergency Medicine | Admitting: Emergency Medicine

## 2013-07-06 ENCOUNTER — Encounter (HOSPITAL_COMMUNITY): Payer: Self-pay | Admitting: Emergency Medicine

## 2013-07-06 ENCOUNTER — Telehealth: Payer: Self-pay

## 2013-07-06 DIAGNOSIS — G35 Multiple sclerosis: Secondary | ICD-10-CM | POA: Insufficient documentation

## 2013-07-06 DIAGNOSIS — Z79899 Other long term (current) drug therapy: Secondary | ICD-10-CM

## 2013-07-06 DIAGNOSIS — M7989 Other specified soft tissue disorders: Secondary | ICD-10-CM | POA: Insufficient documentation

## 2013-07-06 DIAGNOSIS — I10 Essential (primary) hypertension: Secondary | ICD-10-CM

## 2013-07-06 DIAGNOSIS — F411 Generalized anxiety disorder: Secondary | ICD-10-CM

## 2013-07-06 DIAGNOSIS — M79609 Pain in unspecified limb: Secondary | ICD-10-CM

## 2013-07-06 MED ORDER — OXYCODONE-ACETAMINOPHEN 5-325 MG PO TABS
2.0000 | ORAL_TABLET | Freq: Once | ORAL | Status: AC
  Administered 2013-07-06: 2 via ORAL
  Filled 2013-07-06: qty 2

## 2013-07-06 MED ORDER — MORPHINE SULFATE 4 MG/ML IJ SOLN
4.0000 mg | Freq: Once | INTRAMUSCULAR | Status: DC
  Filled 2013-07-06: qty 1

## 2013-07-06 MED ORDER — ENOXAPARIN SODIUM 60 MG/0.6ML ~~LOC~~ SOLN
60.0000 mg | Freq: Once | SUBCUTANEOUS | Status: AC
  Administered 2013-07-06: 60 mg via SUBCUTANEOUS
  Filled 2013-07-06: qty 0.6

## 2013-07-06 MED ORDER — ONDANSETRON HCL 4 MG/2ML IJ SOLN
4.0000 mg | Freq: Once | INTRAMUSCULAR | Status: AC
  Administered 2013-07-06: 4 mg via INTRAVENOUS
  Filled 2013-07-06: qty 2

## 2013-07-06 MED ORDER — LORAZEPAM 0.5 MG PO TABS
0.5000 mg | ORAL_TABLET | Freq: Once | ORAL | Status: AC
  Administered 2013-07-06: 0.5 mg via ORAL
  Filled 2013-07-06: qty 1

## 2013-07-06 NOTE — ED Notes (Signed)
Pt's left hip re-dressed with abd pad and hyperfix.

## 2013-07-06 NOTE — ED Provider Notes (Signed)
CSN: [id number]     Arrival date & time 07/05/13  2334 History   First MD Initiated Contact with Patient 07/06/13 0110     Chief Complaint  Patient presents with  . Leg Swelling  . Bloated     (Consider location/radiation/quality/duration/timing/severity/associated sxs/prior Treatment) HPI Comments: Patient is a 33 year old female who is s/p left hip arthroscopy 1 day ago who presents with leg pain and swelling. Symptoms started gradually after her surgery and progressively worsened since the onset. The pain is throbbing and severe. Patient reports a history of DVT and is concerned about a clot. She did not call her Orthopedic surgeon and instead came directly to the ED. Patient has no other associated symptoms. No aggravating/alleviating factors.    Past Medical History  Diagnosis Date  . MS (multiple sclerosis)   . Hypertension   . Anxiety    Past Surgical History  Procedure Laterality Date  . Bilateral hip arthroscopy    . Joint replacement     No family history on file. History  Substance Use Topics  . Smoking status: Never Smoker   . Smokeless tobacco: Never Used  . Alcohol Use: No   OB History   Grav Para Term Preterm Abortions TAB SAB Ect Mult Living   1 1 1       1      Review of Systems  Constitutional: Negative for fever, chills and fatigue.  HENT: Negative for trouble swallowing.   Eyes: Negative for visual disturbance.  Respiratory: Negative for shortness of breath.   Cardiovascular: Positive for leg swelling. Negative for chest pain and palpitations.  Gastrointestinal: Negative for nausea, vomiting, abdominal pain and diarrhea.  Genitourinary: Negative for dysuria and difficulty urinating.  Musculoskeletal: Positive for joint swelling. Negative for arthralgias and neck pain.  Skin: Negative for color change.  Neurological: Negative for dizziness and weakness.  Psychiatric/Behavioral: Negative for dysphoric mood.      Allergies  Review of patient's  allergies indicates no known allergies.  Home Medications   Prior to Admission medications   Medication Sig Start Date End Date Taking? Authorizing Provider  baclofen (LIORESAL) 20 MG tablet Take 20 mg by mouth 3 (three) times daily as needed (for ms).    Yes Historical Provider, MD  gabapentin (NEURONTIN) 800 MG tablet Take 800 mg by mouth 3 (three) times daily.   Yes Historical Provider, MD  lisinopril (PRINIVIL,ZESTRIL) 20 MG tablet Take 20 mg by mouth daily.   Yes Historical Provider, MD  LORazepam (ATIVAN) 1 MG tablet Take 1 tablet (1 mg total) by mouth daily as needed for anxiety. May take at onset of panic attack 06/10/13  Yes Alveda Reasons, MD  Multiple Vitamin (MULTIVITAMIN WITH MINERALS) TABS Take 1 tablet by mouth daily.   Yes Historical Provider, MD  norethindrone (CAMILA) 0.35 MG tablet Take 1 tablet (0.35 mg total) by mouth daily. 09/18/12 09/18/13 Yes Donnamae Jude, MD  nystatin (MYCOSTATIN) powder Apply topically 4 (four) times daily. To be used on both feet twice a day 05/31/13  Yes Reyne Dumas, MD   BP 135/84  Pulse 101  Temp(Src) 98.2 F (36.8 C) (Oral)  Resp 20  SpO2 100% Physical Exam  Nursing note and vitals reviewed. Constitutional: She is oriented to person, place, and time. She appears well-developed and well-nourished. No distress.  HENT:  Head: Normocephalic and atraumatic.  Eyes: Conjunctivae and EOM are normal.  Neck: Normal range of motion.  Cardiovascular: Normal rate and regular rhythm.  Exam  reveals no gallop and no friction rub.   No murmur heard. Pulmonary/Chest: Effort normal and breath sounds normal. She has no wheezes. She has no rales. She exhibits no tenderness.  Musculoskeletal:  Bandaging intact from surgery of left hip. Left thigh and knee edema with popliteal tenderness to palpation. ROM of left hip and knee limited due to pain.   Neurological: She is alert and oriented to person, place, and time. Coordination normal.  Speech is  goal-oriented. Moves limbs without ataxia.   Skin: Skin is warm and dry.  Psychiatric: She has a normal mood and affect. Her behavior is normal.    ED Course  Procedures (including critical care time) Labs Review Labs Reviewed - No data to display  Imaging Review No results found.   EKG Interpretation None      MDM   Final diagnoses:  Leg swelling  Status post surgery    1:34 AM Patient will have morphine and zofran here. Vitals stable and patient afebrile. No neurovascular compromise.   2:26 AM I spoke with Dr. Lurene Shadow who recommends [place] to rule out DVT. We do not have vascular US on call. Patient will be treated with subcutaneous lovenox and instructed to return here in the morning for DVT study. Vitals stable and patient afebrile.   Alvina Chou, [place] 07/06/13 [phone]

## 2013-07-06 NOTE — Discharge Instructions (Signed)
Return to the ED between 7am and 7pm for an ultrasound of your leg to look for a DVT. Return to the ED sooner with worsening or concerning symptoms.

## 2013-07-06 NOTE — Telephone Encounter (Signed)
Patient states she was seen here and prescribed a medication for her anxiety. Patient states she has run out of the medication and needs a refill as she has been experiencing anxiety attacks. Please return call and advise. Thank you.

## 2013-07-06 NOTE — ED Provider Notes (Signed)
Medical screening examination/treatment/procedure(s) were conducted as a shared visit with non-physician practitioner(s) and myself.  I personally evaluated the patient during the encounter.   EKG Interpretation None     Pt with right thigh, knee swelling after right hip arthroscopy done at outside hospital earlier today.  Compartments soft, no pain.  To have vascular study in am and close f/u with her orthopedist.  Kalman Drape, MD 07/06/13 [phone]

## 2013-07-06 NOTE — ED Notes (Signed)
Pt states she had arthroscopic hip surgery to left hip yesterday (07/05/13).  Pt states last night she noticed swelling to left knee and shin.  Pt was seen here last night and asked to return to ED for doppler this morning.  Pt's lung sounds are clear.  Abdomen is currently soft and non-tender to palpation.  Pt states yesterday after the surgery her abdomen was distended, but has since returned to normal.  Pt denies N/V/D, fever and SOB.  Pt denies pain to left knee.  Pt has good pulses in all 4 extremities.

## 2013-07-06 NOTE — Discharge Instructions (Signed)
Please read and follow all provided instructions.  Your diagnoses today include:  1. Leg swelling     Tests performed today include:  Ultrasound of leg - does not show any blood clots  Vital signs. See below for your results today.   Medications prescribed:   None  Take any prescribed medications only as directed.  Home care instructions:  Follow any educational materials contained in this packet.  Follow-up instructions: Please follow-up with your orthopedist as needed for further evaluation of your symptoms. If you do not have a primary care doctor -- see below for referral information.   Return instructions:   Please return to the Emergency Department if you experience worsening symptoms.   Return with chest pain or shortness of breath  Please return if you have any other emergent concerns.  Additional Information:  Your vital signs today were: BP 125/80   Pulse 96   Temp(Src) 99.1 F (37.3 C) (Oral)   Resp 16   SpO2 100% If your blood pressure (BP) was elevated above 135/85 this visit, please have this repeated by your doctor within one month. --------------

## 2013-07-06 NOTE — ED Provider Notes (Signed)
CSN: [id number]     Arrival date & time 07/06/13  1057 History   First MD Initiated Contact with Patient 07/06/13 1105     Chief Complaint  Patient presents with  . 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 Symptoms unchanged. No CP/SOB, fever. The onset of this condition was acute. The course is constant. Aggravating factors: none. Alleviating factors: none.    The history is provided by the patient and medical records.    Past Medical History  Diagnosis Date  . MS (multiple sclerosis)   . Hypertension   . Anxiety    Past Surgical History  Procedure Laterality Date  . Bilateral hip arthroscopy    . Joint replacement     No family history on file. History  Substance Use Topics  . Smoking status: Never Smoker   . Smokeless tobacco: Never Used  . Alcohol Use: No   OB History   Grav Para Term Preterm Abortions TAB SAB Ect Mult Living   1 1 1       1      Review of Systems  Constitutional: Negative for fever.  Respiratory: Negative for shortness of breath.   Cardiovascular: Positive for leg swelling. Negative for chest pain.  Gastrointestinal: Negative for nausea and vomiting.  Musculoskeletal: Positive for joint swelling. Negative for arthralgias.  Skin: Negative for color change.    Allergies  Review of patient's allergies indicates no known allergies.  Home Medications   Prior to Admission medications   Medication Sig Start Date End Date Taking? Authorizing Provider  baclofen (LIORESAL) 20 MG tablet Take 20 mg by mouth 3 (three) times daily as needed (for ms).    Yes Historical Provider, MD  gabapentin (NEURONTIN) 800 MG tablet Take 800 mg by mouth 3 (three)  times daily.   Yes Historical Provider, MD  lisinopril (PRINIVIL,ZESTRIL) 20 MG tablet Take 20 mg by mouth daily.   Yes Historical Provider, MD  LORazepam (ATIVAN) 1 MG tablet Take 1 tablet (1 mg total) by mouth daily as needed for anxiety. May take at onset of panic attack 06/10/13  Yes Alveda Reasons, MD  Multiple Vitamin (MULTIVITAMIN WITH MINERALS) TABS Take 1 tablet by mouth daily.   Yes Historical Provider, MD  norethindrone (CAMILA) 0.35 MG tablet Take 1 tablet (0.35 mg total) by mouth daily. 09/18/12 09/18/13 Yes Donnamae Jude, MD  nystatin (MYCOSTATIN) powder Apply topically 4 (four) times daily. To be used on both feet twice a day 05/31/13  Yes Reyne Dumas, MD   BP 136/89  Pulse 95  Temp(Src) 98.6 F (37 C) (Oral)  Resp 15  SpO2 100%  Physical Exam  Nursing note and vitals reviewed. Constitutional: She appears well-developed and well-nourished.  HENT:  Head: Normocephalic and atraumatic.  Eyes: Conjunctivae are normal.  Neck: Normal range of motion. Neck supple.  Cardiovascular: Normal rate.   No murmur heard. Pulses:      Dorsalis pedis pulses are 2+ on the right side, and 2+ on the left side.       Posterior tibial pulses are 2+ on the right side, and 2+ on the left side.  Pulmonary/Chest: No respiratory distress.  Musculoskeletal: She exhibits edema. She exhibits no tenderness.  Left hip: She exhibits no swelling.       Left knee: She exhibits swelling and effusion. No tenderness found.       Left upper leg: She exhibits no swelling.       Left lower leg: She exhibits no tenderness and no swelling.       Legs:      Left foot: Normal.  Neurological: She is alert.  Skin: Skin is warm and dry.  Psychiatric: She has a normal mood and affect.    ED Course  Procedures (including critical care time) Labs Review Labs Reviewed - No data to display  Imaging Review No results found.   EKG Interpretation None       11:50 AM Patient seen and examined. Reviewed  note from this morning. Doppler ordered.  Vital signs reviewed and are as follows: Filed Vitals:   07/06/13 1108  BP: 136/89  Pulse: 95  Temp: 98.6 F (37 C)  Resp: 15   1:35 PM US performed and is negative. Pt informed. She requested refill of anxiety meds, I declined, referred to PCP.   Patient urged to return with worsening symptoms or other concerns. She has f/u with ortho on 5/8. Patient verbalized understanding and agrees with plan.    MDM   Final diagnoses:  Leg swelling   Patient with leg swelling, likely post-op swelling as expected. [place] r/o DVT. No s/s of PE  No signs of infection or cellulitis. LE appears neurovascularly intact. She has good follow-up.   No dangerous or life-threatening conditions suspected or identified by history, physical exam, and by work-up. No indications for hospitalization identified.      Carlisle Cater, PA-C 07/06/13 1337

## 2013-07-06 NOTE — ED Notes (Signed)
Pt had hip surgery yesterday (arthroscopy).  Was told that she may have a blood clot, is being treated for one, but they told her to come here to find out if she really does have a blood clot. States that her lt hip/leg is swollen.

## 2013-07-06 NOTE — ED Notes (Signed)
Pt sts she does not want IV pain medication, wants oral. PA made aware.

## 2013-07-06 NOTE — ED Notes (Signed)
Vascular tech at bedside. °

## 2013-07-06 NOTE — Progress Notes (Signed)
VASCULAR LAB PRELIMINARY  PRELIMINARY  PRELIMINARY  PRELIMINARY  Left lower extremity venous Doppler completed.    Preliminary report:  There is no DVT or SVT noted in the left lower extremity.  Iantha Fallen, RVT 07/06/2013, 1:05 PM

## 2013-07-07 NOTE — ED Provider Notes (Signed)
Medical screening examination/treatment/procedure(s) were conducted as a shared visit with non-physician practitioner(s) and myself.  I personally evaluated the patient during the encounter.   EKG Interpretation None      Pt s/p recent left hip procedure, c/o swelling to leg. No cp or sob. No fever. No erythema or increased pain to surgical site. Good rom comfortably. Distal pulses palp.   Mirna Mires, MD 07/07/13 [phone]

## 2013-07-07 NOTE — Telephone Encounter (Signed)
She should F/u with her PCP.  With the amount of medication she has taken since her last visit looks like these are affecting her most days according to her note from her last visit she needs a long acting medication or a refer to psychiatry.

## 2013-07-09 NOTE — Telephone Encounter (Signed)
Spoke to patient and she goes back there on June 29th, advised her if she needs to return here before then, we are available, but her medications may need adjusting if she is having the anxiety attacks more frequently.

## 2013-07-11 ENCOUNTER — Encounter (HOSPITAL_COMMUNITY): Payer: Self-pay | Admitting: Emergency Medicine

## 2013-07-11 ENCOUNTER — Emergency Department (HOSPITAL_COMMUNITY)
Admission: EM | Admit: 2013-07-11 | Discharge: 2013-07-11 | Disposition: A | Discharge status: Home / Self Care | Payer: PRIVATE HEALTH INSURANCE | Attending: Emergency Medicine | Admitting: Emergency Medicine

## 2013-07-11 DIAGNOSIS — I10 Essential (primary) hypertension: Secondary | ICD-10-CM | POA: Insufficient documentation

## 2013-07-11 DIAGNOSIS — E876 Hypokalemia: Secondary | ICD-10-CM | POA: Insufficient documentation

## 2013-07-11 DIAGNOSIS — R42 Dizziness and giddiness: Secondary | ICD-10-CM | POA: Insufficient documentation

## 2013-07-11 DIAGNOSIS — G35 Multiple sclerosis: Secondary | ICD-10-CM | POA: Insufficient documentation

## 2013-07-11 DIAGNOSIS — F419 Anxiety disorder, unspecified: Secondary | ICD-10-CM

## 2013-07-11 DIAGNOSIS — F411 Generalized anxiety disorder: Secondary | ICD-10-CM | POA: Insufficient documentation

## 2013-07-11 DIAGNOSIS — Z79899 Other long term (current) drug therapy: Secondary | ICD-10-CM | POA: Insufficient documentation

## 2013-07-11 LAB — I-STAT CHEM 8, ED
BUN: 9 mg/dL (ref 6–23)
CALCIUM ION: 1.16 mmol/L (ref 1.12–1.23)
CHLORIDE: 97 meq/L (ref 96–112)
Creatinine, Ser: 0.9 mg/dL (ref 0.50–1.10)
Glucose, Bld: 108 mg/dL — ABNORMAL HIGH (ref 70–99)
HCT: 42 % (ref 36.0–46.0)
Hemoglobin: 14.3 g/dL (ref 12.0–15.0)
Potassium: 2.9 mEq/L — CL (ref 3.7–5.3)
Sodium: 142 mEq/L (ref 137–147)
TCO2: 28 mmol/L (ref 0–100)

## 2013-07-11 MED ORDER — OXYCODONE-ACETAMINOPHEN 5-325 MG PO TABS
1.0000 | ORAL_TABLET | ORAL | Status: AC | PRN
  Administered 2013-07-11: 1 via ORAL
  Filled 2013-07-11: qty 1

## 2013-07-11 MED ORDER — SODIUM CHLORIDE 0.9 % IV SOLN: INTRAVENOUS | Status: DC

## 2013-07-11 MED ORDER — POTASSIUM CHLORIDE CRYS ER 20 MEQ PO TBCR
40.0000 meq | EXTENDED_RELEASE_TABLET | Freq: Once | ORAL | Status: AC
  Administered 2013-07-11: 40 meq via ORAL
  Filled 2013-07-11: qty 2

## 2013-07-11 MED ORDER — SODIUM CHLORIDE 0.9 % IV BOLUS (SEPSIS)
1000.0000 mL | Freq: Once | INTRAVENOUS | Status: AC
  Administered 2013-07-11: 1000 mL via INTRAVENOUS

## 2013-07-11 NOTE — ED Notes (Signed)
Pt reports having a panic attack. Pt sts she lost a good friend last year and today she saw some posts about him on facebook, which she thinks triggered anxiety. Pt sts she takes anxiety meds but doesn't think it's helping.

## 2013-07-11 NOTE — Discharge Instructions (Signed)
Hypokalemia Hypokalemia means that the amount of potassium in the blood is lower than normal.Potassium is a chemical, called an electrolyte, that helps regulate the amount of fluid in the body. It also stimulates muscle contraction and helps nerves function properly.Most of the body's potassium is inside of cells, and only a very small amount is in the blood. Because the amount in the blood is so small, minor changes can be life-threatening. CAUSES  Antibiotics.  Diarrhea or vomiting.  Using laxatives too much, which can cause diarrhea.  Chronic kidney disease.  Water pills (diuretics).  Eating disorders (bulimia).  Low magnesium level.  Sweating a lot. SIGNS AND SYMPTOMS  Weakness.  Constipation.  Fatigue.  Muscle cramps.  Mental confusion.  Skipped heartbeats or irregular heartbeat (palpitations).  Tingling or numbness. DIAGNOSIS  Your health care provider can diagnose hypokalemia with blood tests. In addition to checking your potassium level, your health care provider may also check other lab tests. TREATMENT Hypokalemia can be treated with potassium supplements taken by mouth or adjustments in your current medicines. If your potassium level is very low, you may need to get potassium through a vein (IV) and be monitored in the hospital. A diet high in potassium is also helpful. Foods high in potassium are:  Nuts, such as peanuts and pistachios.  Seeds, such as sunflower seeds and pumpkin seeds.  Peas, lentils, and lima beans.  Whole grain and bran cereals and breads.  Fresh fruit and vegetables, such as apricots, avocado, bananas, cantaloupe, kiwi, oranges, tomatoes, asparagus, and potatoes.  Orange and tomato juices.  Red meats.  Fruit yogurt. HOME CARE INSTRUCTIONS  Take all medicines as prescribed by your health care provider.  Maintain a healthy diet by including nutritious food, such as fruits, vegetables, nuts, whole grains, and lean meats.  If  you are taking a laxative, be sure to follow the directions on the label. SEEK MEDICAL CARE IF:  Your weakness gets worse.  You feel your heart pounding or racing.  You are vomiting or having diarrhea.  You are diabetic and having trouble keeping your blood glucose in the normal range. SEEK IMMEDIATE MEDICAL CARE IF:  You have chest pain, shortness of breath, or dizziness.  You are vomiting or having diarrhea for more than 2 days.  You faint. MAKE SURE YOU:   Understand these instructions.  Will watch your condition.  Will get help right away if you are not doing well or get worse. Document Released: 02/21/2005 Document Revised: 12/12/2012 Document Reviewed: 08/24/2012 [facility] Patient Information 2014 [place].  Panic Attacks Panic attacks are sudden, short-livedsurges of severe anxiety, fear, or discomfort. They may occur for no reason when you are relaxed, when you are anxious, or when you are sleeping. Panic attacks may occur for a number of reasons:   Healthy people occasionally have panic attacks in extreme, life-threatening situations, such as war or natural disasters. Normal anxiety is a protective mechanism of the body that helps [place] react to danger (fight or flight response).  Panic attacks are often seen with anxiety disorders, such as panic disorder, social anxiety disorder, generalized anxiety disorder, and phobias. Anxiety disorders cause excessive or uncontrollable anxiety. They may interfere with your relationships or other life activities.  Panic attacks are sometimes seen with other mental illnesses such as depression and posttraumatic stress disorder.  Certain medical conditions, prescription medicines, and drugs of abuse can cause panic attacks. SYMPTOMS  Panic attacks start suddenly, peak within 20 minutes, and are accompanied by four  or more of the following symptoms:  Pounding heart or fast heart rate (palpitations).  Sweating.  Trembling or  shaking.  Shortness of breath or feeling smothered.  Feeling choked.  Chest pain or discomfort.  Nausea or strange feeling in your stomach.  Dizziness, lightheadedness, or feeling like you will faint.  Chills or hot flushes.  Numbness or tingling in your lips or hands and feet.  Feeling that things are not real or feeling that you are not yourself.  Fear of losing control or going crazy.  Fear of dying. Some of these symptoms can mimic serious medical conditions. For example, you may think you are having a heart attack. Although panic attacks can be very scary, they are not life threatening. DIAGNOSIS  Panic attacks are diagnosed through an assessment by your health care provider. Your health care provider will ask questions about your symptoms, such as where and when they occurred. Your health care provider will also ask about your medical history and use of alcohol and drugs, including prescription medicines. Your health care provider may order blood tests or other studies to rule out a serious medical condition. Your health care provider may refer you to a mental health professional for further evaluation. TREATMENT   Most healthy people who have one or two panic attacks in an extreme, life-threatening situation will not require treatment.  The treatment for panic attacks associated with anxiety disorders or other mental illness typically involves counseling with a mental health professional, medicine, or a combination of both. Your health care provider will help determine what treatment is best for you.  Panic attacks due to physical illness usually goes away with treatment of the illness. If prescription medicine is causing panic attacks, talk with your health care provider about stopping the medicine, decreasing the dose, or substituting another medicine.  Panic attacks due to alcohol or drug abuse goes away with abstinence. Some adults need professional help in order to stop  drinking or using drugs. HOME CARE INSTRUCTIONS   Take all your medicines as prescribed.   Check with your health care provider before starting new prescription or over-the-counter medicines.  Keep all follow up appointments with your health care provider. SEEK MEDICAL CARE IF:  You are not able to take your medicines as prescribed.  Your symptoms do not improve or get worse. SEEK IMMEDIATE MEDICAL CARE IF:   You experience panic attack symptoms that are different than your usual symptoms.  You have serious thoughts about hurting yourself or others.  You are taking medicine for panic attacks and have a serious side effect. MAKE SURE YOU:  Understand these instructions.  Will watch your condition.  Will get help right away if you are not doing well or get worse. Document Released: 02/21/2005 Document Revised: 12/12/2012 Document Reviewed: 10/05/2012 [facility] Patient Information 2014 [place].

## 2013-07-11 NOTE — ED Provider Notes (Signed)
CSN: [id number]     Arrival date & time 07/11/13  49 History   First MD Initiated Contact with Patient 07/11/13 1507     Chief Complaint  Patient presents with  . Anxiety  . Dizziness     (Consider location/radiation/quality/duration/timing/severity/associated sxs/prior Treatment) Patient is a 32 y.o. female presenting with anxiety and dizziness. The history is provided by the patient.  Anxiety  Dizziness  patient here after having acute onset of being dizzy and lightheaded when she is driving. States that she has not had anything to eat today. Did take her medications for multiple sclerosis as well as will start on a new medication yesterday. She began to hyperventilate at tingling in her distal extremities. Denies any syncope. EMS was called and placed his blood sugar was above 100. She feels better at this time. States that the symptoms are similar to her prior anxiety attacks. She does admit to a great deal of stress currently. She is concerned that she may be dehydrated.  Past Medical History  Diagnosis Date  . MS (multiple sclerosis)   . Hypertension   . Anxiety    Past Surgical History  Procedure Laterality Date  . Bilateral hip arthroscopy    . Joint replacement     No family history on file. History  Substance Use Topics  . Smoking status: Never Smoker   . Smokeless tobacco: Never Used  . Alcohol Use: No   OB History   Grav Para Term Preterm Abortions TAB SAB Ect Mult Living   1 1 1       1      Review of Systems  Neurological: Positive for dizziness.  All other systems reviewed and are negative.     Allergies  Review of patient's allergies indicates no known allergies.  Home Medications   Prior to Admission medications   Medication Sig Start Date End Date Taking? Authorizing Provider  baclofen (LIORESAL) 20 MG tablet Take 20 mg by mouth 3 (three) times daily as needed (for ms).    Yes Historical Provider, MD  citalopram (CELEXA) 10 MG tablet Take 10 mg  by mouth daily.  07/10/13  Yes Historical Provider, MD  Dimethyl Fumarate (TECFIDERA) 240 MG CPDR Take 240 mg by mouth 2 (two) times daily.   Yes Historical Provider, MD  gabapentin (NEURONTIN) 800 MG tablet Take 800 mg by mouth 3 (three) times daily.   Yes Historical Provider, MD  lisinopril (PRINIVIL,ZESTRIL) 20 MG tablet Take 20 mg by mouth daily.   Yes Historical Provider, MD  Multiple Vitamin (MULTIVITAMIN WITH MINERALS) TABS Take 1 tablet by mouth daily.   Yes Historical Provider, MD  norethindrone (CAMILA) 0.35 MG tablet Take 1 tablet (0.35 mg total) by mouth daily. 09/18/12 09/18/13 Yes Donnamae Jude, MD  omeprazole (PRILOSEC) 20 MG capsule Take 20 mg by mouth daily as needed (heart burn).  07/10/13  Yes Historical Provider, MD  OVER THE COUNTER MEDICATION Take 1 capsule by mouth 2 (two) times daily. Bee Capsule   Yes Historical Provider, MD  oxybutynin (DITROPAN) 5 MG tablet Take 5 mg by mouth at bedtime.  07/10/13  Yes Historical Provider, MD  OxyCODONE HCl, Abuse Deter, 5 MG TABA Take 5 mg by mouth 3 (three) times daily as needed (pain).   Yes Historical Provider, MD  tiZANidine (ZANAFLEX) 4 MG tablet Take 4 mg by mouth at bedtime.  07/07/13  Yes Historical Provider, MD   BP 133/90  Pulse 95  Temp(Src) 98.3 F (36.8 C) (Oral)  Resp 19  SpO2 100%  LMP 07/03/2013 Physical Exam  Nursing note and vitals reviewed. Constitutional: She is oriented to person, place, and time. She appears well-developed and well-nourished.  Non-toxic appearance. No distress.  HENT:  Head: Normocephalic and atraumatic.  Eyes: Conjunctivae, EOM and lids are normal. Pupils are equal, round, and reactive to light.  Neck: Normal range of motion. Neck supple. No tracheal deviation present. No mass present.  Cardiovascular: Normal rate, regular rhythm and normal heart sounds.  Exam reveals no gallop.   No murmur heard. Pulmonary/Chest: Effort normal and breath sounds normal. No stridor. No respiratory distress. She has  no decreased breath sounds. She has no wheezes. She has no rhonchi. She has no rales.  Abdominal: Soft. Normal appearance and bowel sounds are normal. She exhibits no distension. There is no tenderness. There is no rebound and no CVA tenderness.  Musculoskeletal: Normal range of motion. She exhibits no edema and no tenderness.  Neurological: She is alert and oriented to person, place, and time. She has normal strength. No cranial nerve deficit or sensory deficit. GCS eye subscore is 4. GCS verbal subscore is 5. GCS motor subscore is 6.  Skin: Skin is warm and dry. No abrasion and no rash noted.  Psychiatric: She has a normal mood and affect. Her speech is normal and behavior is normal.    ED Course  Procedures (including critical care time) Labs Review Labs Reviewed  I-STAT CHEM 8, ED    Imaging Review No results found.   EKG Interpretation None      MDM   Final diagnoses:  None    Patient given oral potassium as well as IV fluids. Suspect that this is a anxiety attack and she is stable for discharge     Leota Jacobsen, MD 07/11/13 1753

## 2013-07-11 NOTE — ED Notes (Signed)
Bed: ZJ67 Expected date:  Expected time:  Means of arrival:  Comments: EMS- Near Syncope, dehydration?, Hx of MS

## 2013-07-11 NOTE — ED Notes (Signed)
Zenia Resides EDP made aware of patient Chem 8 results.

## 2013-07-11 NOTE — ED Notes (Signed)
Per EMS report pt was on her way to work, driving when she started feeling lightheaded, felt some tingling in her fingers. Per EMS Pt has hx of MS which gets worse from the heat. Pt ambulatory to bathroom upon arrival.

## 2013-07-18 ENCOUNTER — Other Ambulatory Visit: Payer: Self-pay | Admitting: Specialist

## 2013-07-18 DIAGNOSIS — G35 Multiple sclerosis: Secondary | ICD-10-CM

## 2013-07-22 ENCOUNTER — Ambulatory Visit: Payer: PRIVATE HEALTH INSURANCE | Attending: Physical Medicine and Rehabilitation | Admitting: Physical Therapy

## 2013-07-22 DIAGNOSIS — R262 Difficulty in walking, not elsewhere classified: Secondary | ICD-10-CM | POA: Insufficient documentation

## 2013-07-22 DIAGNOSIS — M6281 Muscle weakness (generalized): Secondary | ICD-10-CM | POA: Diagnosis not present

## 2013-07-22 DIAGNOSIS — M25559 Pain in unspecified hip: Secondary | ICD-10-CM | POA: Insufficient documentation

## 2013-07-22 DIAGNOSIS — Z5189 Encounter for other specified aftercare: Secondary | ICD-10-CM | POA: Insufficient documentation

## 2013-08-01 ENCOUNTER — Encounter: Payer: PRIVATE HEALTH INSURANCE | Admitting: Physical Therapy

## 2013-08-04 ENCOUNTER — Emergency Department (HOSPITAL_COMMUNITY)
Admission: EM | Admit: 2013-08-04 | Discharge: 2013-08-04 | Disposition: A | Discharge status: Home / Self Care | Payer: PRIVATE HEALTH INSURANCE | Attending: Emergency Medicine | Admitting: Emergency Medicine

## 2013-08-04 ENCOUNTER — Encounter (HOSPITAL_COMMUNITY): Payer: Self-pay | Admitting: Emergency Medicine

## 2013-08-04 DIAGNOSIS — Z3202 Encounter for pregnancy test, result negative: Secondary | ICD-10-CM | POA: Insufficient documentation

## 2013-08-04 DIAGNOSIS — F329 Major depressive disorder, single episode, unspecified: Secondary | ICD-10-CM | POA: Insufficient documentation

## 2013-08-04 DIAGNOSIS — Z79899 Other long term (current) drug therapy: Secondary | ICD-10-CM | POA: Diagnosis not present

## 2013-08-04 DIAGNOSIS — R112 Nausea with vomiting, unspecified: Secondary | ICD-10-CM | POA: Diagnosis not present

## 2013-08-04 DIAGNOSIS — F3289 Other specified depressive episodes: Secondary | ICD-10-CM | POA: Diagnosis not present

## 2013-08-04 DIAGNOSIS — I10 Essential (primary) hypertension: Secondary | ICD-10-CM | POA: Diagnosis not present

## 2013-08-04 DIAGNOSIS — R209 Unspecified disturbances of skin sensation: Secondary | ICD-10-CM | POA: Diagnosis not present

## 2013-08-04 DIAGNOSIS — F411 Generalized anxiety disorder: Secondary | ICD-10-CM | POA: Diagnosis not present

## 2013-08-04 DIAGNOSIS — R109 Unspecified abdominal pain: Secondary | ICD-10-CM | POA: Diagnosis present

## 2013-08-04 DIAGNOSIS — F419 Anxiety disorder, unspecified: Secondary | ICD-10-CM

## 2013-08-04 DIAGNOSIS — K5900 Constipation, unspecified: Secondary | ICD-10-CM | POA: Insufficient documentation

## 2013-08-04 DIAGNOSIS — G35 Multiple sclerosis: Secondary | ICD-10-CM | POA: Insufficient documentation

## 2013-08-04 DIAGNOSIS — R42 Dizziness and giddiness: Secondary | ICD-10-CM | POA: Diagnosis not present

## 2013-08-04 DIAGNOSIS — R61 Generalized hyperhidrosis: Secondary | ICD-10-CM | POA: Diagnosis not present

## 2013-08-04 HISTORY — DX: Major depressive disorder, single episode, unspecified: F32.9

## 2013-08-04 HISTORY — DX: Depression, unspecified: F32.A

## 2013-08-04 LAB — URINALYSIS, ROUTINE W REFLEX MICROSCOPIC
Bilirubin Urine: NEGATIVE
Glucose, UA: NEGATIVE mg/dL
KETONES UR: NEGATIVE mg/dL
Leukocytes, UA: NEGATIVE
NITRITE: NEGATIVE
PH: 7 (ref 5.0–8.0)
Protein, ur: NEGATIVE mg/dL
Specific Gravity, Urine: 1.02 (ref 1.005–1.030)
Urobilinogen, UA: 1 mg/dL (ref 0.0–1.0)

## 2013-08-04 LAB — CBG MONITORING, ED: Glucose-Capillary: 68 mg/dL — ABNORMAL LOW (ref 70–99)

## 2013-08-04 LAB — CBC WITH DIFFERENTIAL/PLATELET
Basophils Absolute: 0 10*3/uL (ref 0.0–0.1)
Basophils Relative: 0 % (ref 0–1)
EOS ABS: 0 10*3/uL (ref 0.0–0.7)
Eosinophils Relative: 0 % (ref 0–5)
HEMATOCRIT: 41.3 % (ref 36.0–46.0)
HEMOGLOBIN: 14.8 g/dL (ref 12.0–15.0)
LYMPHS ABS: 0.5 10*3/uL — AB (ref 0.7–4.0)
Lymphocytes Relative: 11 % — ABNORMAL LOW (ref 12–46)
MCH: 33.9 pg (ref 26.0–34.0)
MCHC: 35.8 g/dL (ref 30.0–36.0)
MCV: 94.7 fL (ref 78.0–100.0)
MONO ABS: 0.3 10*3/uL (ref 0.1–1.0)
MONOS PCT: 6 % (ref 3–12)
NEUTROS PCT: 83 % — AB (ref 43–77)
Neutro Abs: 4 10*3/uL (ref 1.7–7.7)
Platelets: 210 10*3/uL (ref 150–400)
RBC: 4.36 MIL/uL (ref 3.87–5.11)
RDW: 12.2 % (ref 11.5–15.5)
WBC: 4.8 10*3/uL (ref 4.0–10.5)

## 2013-08-04 LAB — COMPREHENSIVE METABOLIC PANEL
ALBUMIN: 4.4 g/dL (ref 3.5–5.2)
ALT: 10 U/L (ref 0–35)
AST: 19 U/L (ref 0–37)
Alkaline Phosphatase: 54 U/L (ref 39–117)
BUN: 16 mg/dL (ref 6–23)
CALCIUM: 9.4 mg/dL (ref 8.4–10.5)
CO2: 28 mEq/L (ref 19–32)
Chloride: 102 mEq/L (ref 96–112)
Creatinine, Ser: 0.83 mg/dL (ref 0.50–1.10)
GFR calc non Af Amer: >90 mL/min (ref >90)
GLUCOSE: 87 mg/dL (ref 70–99)
Potassium: 3.6 mEq/L — ABNORMAL LOW (ref 3.7–5.3)
Sodium: 141 mEq/L (ref 137–147)
Total Bilirubin: 0.7 mg/dL (ref 0.3–1.2)
Total Protein: 7.5 g/dL (ref 6.0–8.3)

## 2013-08-04 LAB — LIPASE, BLOOD: LIPASE: 37 U/L (ref 11–59)

## 2013-08-04 LAB — POC URINE PREG, ED: Preg Test, Ur: NEGATIVE

## 2013-08-04 LAB — URINE MICROSCOPIC-ADD ON

## 2013-08-04 MED ORDER — SODIUM CHLORIDE 0.9 % IV BOLUS (SEPSIS)
1000.0000 mL | Freq: Once | INTRAVENOUS | Status: AC
  Administered 2013-08-04: 1000 mL via INTRAVENOUS

## 2013-08-04 MED ORDER — LORAZEPAM 2 MG/ML IJ SOLN
1.0000 mg | Freq: Once | INTRAMUSCULAR | Status: AC
  Administered 2013-08-04: 1 mg via INTRAVENOUS
  Filled 2013-08-04: qty 1

## 2013-08-04 NOTE — ED Notes (Signed)
Bed: TW65 Expected date:  Expected time:  Means of arrival:  Comments: EMS n/v/abd. pain

## 2013-08-04 NOTE — Discharge Instructions (Signed)
Try over the counter miralax for constipation  Drink plenty of fluids and rest  Return to the emergency department if you develop any changing/worsening condition, chest pain, fever, abdominal pain, repeated vomiting, or any other concerns (please read additional information regarding your condition below)'   Generalized Anxiety Disorder Generalized anxiety disorder (GAD) is a mental disorder. It interferes with life functions, including relationships, work, and school. GAD is different from normal anxiety, which everyone experiences at some point in their lives in response to specific life events and activities. Normal anxiety actually helps [place] prepare for and get through these life events and activities. Normal anxiety goes away after the event or activity is over.  GAD causes anxiety that is not necessarily related to specific events or activities. It also causes excess anxiety in proportion to specific events or activities. The anxiety associated with GAD is also difficult to control. GAD can vary from mild to severe. People with severe GAD can have intense waves of anxiety with physical symptoms (panic attacks).  SYMPTOMS The anxiety and worry associated with GAD are difficult to control. This anxiety and worry are related to many life events and activities and also occur more days than not for 6 months or longer. People with GAD also have three or more of the following symptoms (one or more in children):  Restlessness.   Fatigue.  Difficulty concentrating.   Irritability.  Muscle tension.  Difficulty sleeping or unsatisfying sleep. DIAGNOSIS GAD is diagnosed through an assessment by your caregiver. Your caregiver will ask you questions aboutyour mood,physical symptoms, and events in your life. Your caregiver may ask you about your medical history and use of alcohol or drugs, including prescription medications. Your caregiver may also do a physical exam and blood tests. Certain  medical conditions and the use of certain substances can cause symptoms similar to those associated with GAD. Your caregiver may refer you to a mental health specialist for further evaluation. TREATMENT The following therapies are usually used to treat GAD:   Medication Antidepressant medication usually is prescribed for long-term daily control. Antianxiety medications may be added in severe cases, especially when panic attacks occur.   Talk therapy (psychotherapy) Certain types of talk therapy can be helpful in treating GAD by providing support, education, and guidance. A form of talk therapy called cognitive behavioral therapy can teach you healthy ways to think about and react to daily life events and activities.  Stress managementtechniques These include yoga, meditation, and exercise and can be very helpful when they are practiced regularly. A mental health specialist can help determine which treatment is best for you. Some people see improvement with one therapy. However, other people require a combination of therapies. Document Released: 06/18/2012 Document Reviewed: 06/18/2012 [facility] Patient Information 2014 [place], [place].   Abdominal Pain, Adult Many things can cause abdominal pain. Usually, abdominal pain is not caused by a disease and will improve without treatment. It can often be observed and treated at home. Your health care provider will do a physical exam and possibly order blood tests and X-rays to help determine the seriousness of your pain. However, in many cases, more time must pass before a clear cause of the pain can be found. Before that point, your health care provider may not know if you need more testing or further treatment. HOME CARE INSTRUCTIONS  Monitor your abdominal pain for any changes. The following actions may help to alleviate any discomfort you are experiencing:  Only take over-the-counter or prescription medicines as  directed by your health care  provider.  Do not take laxatives unless directed to do so by your health care provider.  Try a clear liquid diet (broth, tea, or water) as directed by your health care provider. Slowly move to a bland diet as tolerated. SEEK MEDICAL CARE IF:  You have unexplained abdominal pain.  You have abdominal pain associated with nausea or diarrhea.  You have pain when you urinate or have a bowel movement.  You experience abdominal pain that wakes you in the night.  You have abdominal pain that is worsened or improved by eating food.  You have abdominal pain that is worsened with eating fatty foods. SEEK IMMEDIATE MEDICAL CARE IF:   Your pain does not go away within 2 hours.  You have a fever.  You keep throwing up (vomiting).  Your pain is felt only in portions of the abdomen, such as the right side or the left lower portion of the abdomen.  You pass bloody or black tarry stools. MAKE SURE YOU:  Understand these instructions.   Will watch your condition.   Will get help right away if you are not doing well or get worse.  Document Released: 12/01/2004 Document Revised: 12/12/2012 Document Reviewed: 10/31/2012 [facility] Patient Information 2014 [place].  Nausea and Vomiting Nausea is a sick feeling that often comes before throwing up (vomiting). Vomiting is a reflex where stomach contents come out of your mouth. Vomiting can cause severe loss of body fluids (dehydration). Children and elderly adults can become dehydrated quickly, especially if they also have diarrhea. Nausea and vomiting are symptoms of a condition or disease. It is important to find the cause of your symptoms. CAUSES   Direct irritation of the stomach lining. This irritation can result from increased acid production (gastroesophageal reflux disease), infection, food poisoning, taking certain medicines (such as nonsteroidal anti-inflammatory drugs), alcohol use, or tobacco use.  Signals from the  brain.These signals could be caused by a headache, heat exposure, an inner ear disturbance, increased pressure in the brain from injury, infection, a tumor, or a concussion, pain, emotional stimulus, or metabolic problems.  An obstruction in the gastrointestinal tract (bowel obstruction).  Illnesses such as diabetes, hepatitis, gallbladder problems, appendicitis, kidney problems, cancer, sepsis, atypical symptoms of a heart attack, or eating disorders.  Medical treatments such as chemotherapy and radiation.  Receiving medicine that makes you sleep (general anesthetic) during surgery. DIAGNOSIS Your caregiver may ask for tests to be done if the problems do not improve after a few days. Tests may also be done if symptoms are severe or if the reason for the nausea and vomiting is not clear. Tests may include:  Urine tests.  Blood tests.  Stool tests.  Cultures (to look for evidence of infection).  X-rays or other imaging studies. Test results can help your caregiver make decisions about treatment or the need for additional tests. TREATMENT You need to stay well hydrated. Drink frequently but in small amounts.You may wish to drink water, sports drinks, clear broth, or eat frozen ice pops or gelatin dessert to help stay hydrated.When you eat, eating slowly may help prevent nausea.There are also some antinausea medicines that may help prevent nausea. HOME CARE INSTRUCTIONS   Take all medicine as directed by your caregiver.  If you do not have an appetite, do not force yourself to eat. However, you must continue to drink fluids.  If you have an appetite, eat a normal diet unless your caregiver tells you differently.  Eat a variety of complex carbohydrates (rice, wheat, potatoes, bread), lean meats, yogurt, fruits, and vegetables.  Avoid high-fat foods because they are more difficult to digest.  Drink enough water and fluids to keep your urine clear or pale yellow.  If you are  dehydrated, ask your caregiver for specific rehydration instructions. Signs of dehydration may include:  Severe thirst.  Dry lips and mouth.  Dizziness.  Dark urine.  Decreasing urine frequency and amount.  Confusion.  Rapid breathing or pulse. SEEK IMMEDIATE MEDICAL CARE IF:   You have blood or brown flecks (like coffee grounds) in your vomit.  You have black or bloody stools.  You have a severe headache or stiff neck.  You are confused.  You have severe abdominal pain.  You have chest pain or trouble breathing.  You do not urinate at least once every 8 hours.  You develop cold or clammy skin.  You continue to vomit for longer than 24 to 48 hours.  You have a fever. MAKE SURE YOU:   Understand these instructions.  Will watch your condition.  Will get help right away if you are not doing well or get worse. Document Released: 02/21/2005 Document Revised: 05/16/2011 Document Reviewed: 07/21/2010 [facility] Patient Information 2014 [place], [place].  Constipation, Adult Constipation is when a person has fewer than 3 bowel movements a week; has difficulty having a bowel movement; or has stools that are dry, hard, or larger than normal. As people grow older, constipation is more common. If you try to fix constipation with medicines that make you have a bowel movement (laxatives), the problem may get worse. Long-term laxative use may cause the muscles of the colon to become weak. A low-fiber diet, not taking in enough fluids, and taking certain medicines may make constipation worse. CAUSES   Certain medicines, such as antidepressants, pain medicine, iron supplements, antacids, and water pills.   Certain diseases, such as diabetes, irritable bowel syndrome (IBS), thyroid disease, or depression.   Not drinking enough water.   Not eating enough fiber-rich foods.   Stress or travel.  Lack of physical activity or exercise.  Not going to the restroom when there is  the urge to have a bowel movement.  Ignoring the urge to have a bowel movement.  Using laxatives too much. SYMPTOMS   Having fewer than 3 bowel movements a week.   Straining to have a bowel movement.   Having hard, dry, or larger than normal stools.   Feeling full or bloated.   Pain in the lower abdomen.  Not feeling relief after having a bowel movement. DIAGNOSIS  Your caregiver will take a medical history and perform a physical exam. Further testing may be done for severe constipation. Some tests may include:   A barium enema X-ray to examine your rectum, colon, and sometimes, your small intestine.  A sigmoidoscopy to examine your lower colon.  A colonoscopy to examine your entire colon. TREATMENT  Treatment will depend on the severity of your constipation and what is causing it. Some dietary treatments include drinking more fluids and eating more fiber-rich foods. Lifestyle treatments may include regular exercise. If these diet and lifestyle recommendations do not help, your caregiver may recommend taking over-the-counter laxative medicines to help you have bowel movements. Prescription medicines may be prescribed if over-the-counter medicines do not work.  HOME CARE INSTRUCTIONS   Increase dietary fiber in your diet, such as fruits, vegetables, whole grains, and beans. Limit high-fat and processed sugars in your diet, such  as [place] fries, hamburgers, cookies, candies, and soda.   A fiber supplement may be added to your diet if you cannot get enough fiber from foods.   Drink enough fluids to keep your urine clear or pale yellow.   Exercise regularly or as directed by your caregiver.   Go to the restroom when you have the urge to go. Do not hold it.  Only take medicines as directed by your caregiver. Do not take other medicines for constipation without talking to your caregiver first. [place] IF:   You have bright red blood in your stool.    Your constipation lasts for more than 4 days or gets worse.   You have abdominal or rectal pain.   You have thin, pencil-like stools.  You have unexplained weight loss. MAKE SURE YOU:   Understand these instructions.  Will watch your condition.  Will get help right away if you are not doing well or get worse. Document Released: 11/20/2003 Document Revised: 05/16/2011 Document Reviewed: 12/03/2012 [facility] Patient Information 2014 [place], [place].

## 2013-08-04 NOTE — ED Notes (Signed)
Pt woke up this am with dizziness, tremors and tingling in arms. Pt began to have generalized abd pain and emesis 3x.

## 2013-08-04 NOTE — ED Provider Notes (Signed)
CSN: [id number]     Arrival date & time 08/04/13  0808 History   First MD Initiated Contact with Patient 08/04/13 [phone]     Chief Complaint  Patient presents with  . Abdominal Pain  . Emesis  . Dizziness   HPI  Dawn Peters is a 33 y.o. female with a PMH of MS, HTN, anxiety and depression who presents to the ED for evaluation of abdominal pain, emesis, and dizziness. History was provided by the patient. Patient states she woke up this morning with abdominal pain around 7:00 am this morning. Pain is described as a dull pain throughout. She had three episodes of non-bilious and non-bloody emesis. She states she started to develop a panic attack (similar to her panic attacks in the past) due to her abdominal pain and emesis. Her symptoms include anxiety, facial and bilateral hand numbness and tingling, hot flash, SOB, tremors, and lightheadedness. She states these symptoms have resolved aside from mild tingling in her hands bilaterally. She denies any chest pain, headache, focal weakness, confusion, loss of sensation, dyspnea, wheezing. She states she is on celexa for her anxiety and depression but it is "not working." Patient seen in the ED on 07/11/13 for anxiety and followed-up with her neurologist who started her on this medication. She denies any diarrhea, dysuria, vaginal discharge/bleeding. States has issues with constipation but had a normal bowel movement this morning. She denies any sick contacts or recent abnormal foods. She otherwise has been feeling well with no fevers, chills, change in appetite/activity. States she has been very stressed with no having a job and financial reasons. States she had a similar panic attack last week in church and took her last lorazepam which helped, but she ran out.    Past Medical History  Diagnosis Date  . MS (multiple sclerosis)   . Hypertension   . Anxiety   . Depression    Past Surgical History  Procedure Laterality Date  . Bilateral hip  arthroscopy    . Joint replacement     History reviewed. No pertinent family history. History  Substance Use Topics  . Smoking status: Never Smoker   . Smokeless tobacco: Never Used  . Alcohol Use: No   OB History   Grav Para Term Preterm Abortions TAB SAB Ect Mult Living   1 1 1       1       Review of Systems  Constitutional: Positive for diaphoresis (hot flash). Negative for fever, chills, activity change, appetite change and fatigue.  HENT: Negative for congestion, rhinorrhea and sore throat.   Respiratory: Positive for shortness of breath (resolved). Negative for cough and wheezing.   Cardiovascular: Negative for chest pain, palpitations and leg swelling.  Gastrointestinal: Positive for nausea, vomiting, abdominal pain and constipation. Negative for diarrhea and anal bleeding.  Genitourinary: Negative for dysuria, hematuria, decreased urine volume, vaginal bleeding, vaginal discharge, difficulty urinating, vaginal pain and pelvic pain.  Musculoskeletal: Negative for back pain, myalgias and neck pain.  Skin: Negative for rash and wound.  Neurological: Positive for tremors (resolved), light-headedness (resolved) and numbness. Negative for dizziness, syncope, facial asymmetry, weakness and headaches.  Psychiatric/Behavioral: The patient is nervous/anxious.     Allergies  Review of patient's allergies indicates no known allergies.  Home Medications   Prior to Admission medications   Medication Sig Start Date End Date Taking? Authorizing Provider  baclofen (LIORESAL) 20 MG tablet Take 20 mg by mouth 3 (three) times daily as needed (for ms).  Historical Provider, MD  citalopram (CELEXA) 10 MG tablet Take 10 mg by mouth daily.  07/10/13   Historical Provider, MD  Dimethyl Fumarate (TECFIDERA) 240 MG CPDR Take 240 mg by mouth 2 (two) times daily.    Historical Provider, MD  gabapentin (NEURONTIN) 800 MG tablet Take 800 mg by mouth 3 (three) times daily.    Historical Provider, MD   lisinopril (PRINIVIL,ZESTRIL) 20 MG tablet Take 20 mg by mouth daily.    Historical Provider, MD  Multiple Vitamin (MULTIVITAMIN WITH MINERALS) TABS Take 1 tablet by mouth daily.    Historical Provider, MD  norethindrone (CAMILA) 0.35 MG tablet Take 1 tablet (0.35 mg total) by mouth daily. 09/18/12 09/18/13  Donnamae Jude, MD  omeprazole (PRILOSEC) 20 MG capsule Take 20 mg by mouth daily as needed (heart burn).  07/10/13   Historical Provider, MD  OVER THE COUNTER MEDICATION Take 1 capsule by mouth 2 (two) times daily. Bee Capsule    Historical Provider, MD  oxybutynin (DITROPAN) 5 MG tablet Take 5 mg by mouth at bedtime.  07/10/13   Historical Provider, MD  OxyCODONE HCl, Abuse Deter, 5 MG TABA Take 5 mg by mouth 3 (three) times daily as needed (pain).    Historical Provider, MD  tiZANidine (ZANAFLEX) 4 MG tablet Take 4 mg by mouth at bedtime.  07/07/13   Historical Provider, MD   BP 129/93  Pulse 102  Temp(Src) 97.5 F (36.4 C) (Oral)  Resp 16  SpO2 100%  LMP 08/01/2013  Filed Vitals:   08/04/13 1030 08/04/13 1145 08/04/13 1207 08/04/13 1316  BP: 149/93  150/95 133/84  Pulse:   86 98  Temp:    98.8 F (37.1 C)  TempSrc:    Oral  Resp: 25 19 16 16   SpO2:   99% 100%    Physical Exam  Nursing note and vitals reviewed. Constitutional: She is oriented to person, place, and time. She appears well-developed and well-nourished. No distress.  Patient appears anxious  HENT:  Head: Normocephalic and atraumatic.  Right Ear: External ear normal.  Left Ear: External ear normal.  Nose: Nose normal.  Mouth/Throat: Oropharynx is clear and moist. No oropharyngeal exudate.  Eyes: Conjunctivae are normal. Right eye exhibits no discharge. Left eye exhibits no discharge.  Neck: Normal range of motion. Neck supple.  Cardiovascular: Normal rate, regular rhythm, normal heart sounds and intact distal pulses.  Exam reveals no gallop and no friction rub.   No murmur heard. Pulmonary/Chest: Effort normal  and breath sounds normal. No respiratory distress. She has no wheezes. She has no rales. She exhibits no tenderness.  Abdominal: Soft. Bowel sounds are normal. She exhibits no distension and no mass. There is no tenderness. There is no rebound and no guarding.  Musculoskeletal: Normal range of motion. She exhibits no edema and no tenderness.  No CVA, lumbar, or flank tenderness bilaterally. Strength 5/5 in the upper and lower extremities bilaterally. Patient able to ambulate without difficulty or ataxia  Neurological: She is alert and oriented to person, place, and time.  GCS 15. No focal neurological deficits. CN 2-12 intact.  No pronator drift. Gross sensation intact in the UE and LE bilaterally  Skin: Skin is warm and dry. She is not diaphoretic.     ED Course  Procedures (including critical care time) Labs Review Labs Reviewed  CBG MONITORING, ED - Abnormal; Notable for the following:    Glucose-Capillary 68 (*)    All other components within normal limits  Imaging Review No results found.   EKG Interpretation None       Date: 08/04/2013  Rate: 96  Rhythm: normal sinus rhythm  QRS Axis: normal  Intervals: PR prolonged and QT prolonged (PR 216 and QTc 548)  ST/T Wave abnormalities: normal  Conduction Disutrbances:first-degree A-V block   Narrative Interpretation:   Old EKG Reviewed: none available   Results for orders placed during the hospital encounter of 08/04/13  URINALYSIS, ROUTINE W REFLEX MICROSCOPIC      Result Value Ref Range   Color, Urine YELLOW  YELLOW   APPearance CLOUDY (*) CLEAR   Specific Gravity, Urine 1.020  1.005 - 1.030   pH 7.0  5.0 - 8.0   Glucose, UA NEGATIVE  NEGATIVE mg/dL   Hgb urine dipstick LARGE (*) NEGATIVE   Bilirubin Urine NEGATIVE  NEGATIVE   Ketones, ur NEGATIVE  NEGATIVE mg/dL   Protein, ur NEGATIVE  NEGATIVE mg/dL   Urobilinogen, UA 1.0  0.0 - 1.0 mg/dL   Nitrite NEGATIVE  NEGATIVE   Leukocytes, UA NEGATIVE  NEGATIVE  CBC  WITH DIFFERENTIAL      Result Value Ref Range   WBC 4.8  4.0 - 10.5 K/uL   RBC 4.36  3.87 - 5.11 MIL/uL   Hemoglobin 14.8  12.0 - 15.0 g/dL   HCT 41.3  36.0 - 46.0 %   MCV 94.7  78.0 - 100.0 fL   MCH 33.9  26.0 - 34.0 pg   MCHC 35.8  30.0 - 36.0 g/dL   RDW 12.2  11.5 - 15.5 %   Platelets 210  150 - 400 K/uL   Neutrophils Relative % 83 (*) 43 - 77 %   Neutro Abs 4.0  1.7 - 7.7 K/uL   Lymphocytes Relative 11 (*) 12 - 46 %   Lymphs Abs 0.5 (*) 0.7 - 4.0 K/uL   Monocytes Relative 6  3 - 12 %   Monocytes Absolute 0.3  0.1 - 1.0 K/uL   Eosinophils Relative 0  0 - 5 %   Eosinophils Absolute 0.0  0.0 - 0.7 K/uL   Basophils Relative 0  0 - 1 %   Basophils Absolute 0.0  0.0 - 0.1 K/uL  COMPREHENSIVE METABOLIC PANEL      Result Value Ref Range   Sodium 141  137 - 147 mEq/L   Potassium 3.6 (*) 3.7 - 5.3 mEq/L   Chloride 102  96 - 112 mEq/L   CO2 28  19 - 32 mEq/L   Glucose, Bld 87  70 - 99 mg/dL   BUN 16  6 - 23 mg/dL   Creatinine, Ser 0.83  0.50 - 1.10 mg/dL   Calcium 9.4  8.4 - 10.5 mg/dL   Total Protein 7.5  6.0 - 8.3 g/dL   Albumin 4.4  3.5 - 5.2 g/dL   AST 19  0 - 37 U/L   ALT 10  0 - 35 U/L   Alkaline Phosphatase 54  39 - 117 U/L   Total Bilirubin 0.7  0.3 - 1.2 mg/dL   GFR calc non Af Amer >90  >90 mL/min   GFR calc Af Amer >90  >90 mL/min  LIPASE, BLOOD      Result Value Ref Range   Lipase 37  11 - 59 U/L  URINE MICROSCOPIC-ADD ON      Result Value Ref Range   Squamous Epithelial / LPF MANY (*) RARE   WBC, UA 0-2  <3 WBC/hpf   RBC / HPF  0-2  <3 RBC/hpf   Bacteria, UA MANY (*) RARE  CBG MONITORING, ED      Result Value Ref Range   Glucose-Capillary 68 (*) 70 - 99 mg/dL  POC URINE PREG, ED      Result Value Ref Range   Preg Test, Ur NEGATIVE  NEGATIVE     MDM   Dawn Peters is a 33 y.o. female with a PMH of MS, HTN, anxiety and depression who presents to the ED for evaluation of abdominal pain, emesis, and dizziness. Etiology of abdominal pain possibly  due to constipation vs viral syndrome/gastritis. Patient encouraged to try and over the counter stool softener. Patient had no abdominal tenderness to palpation. Abdominal pain resolved throughout ED visit. Labs unremarkable. Had mild hypoglycemia (68) initially which may be due to vomiting vs decreased intake. This improved throughout ED visit. Patient afebrile and non-toxic in appearance. Vital signs stable. Patient likely had a panic attack PTA. Has a hx of anxiety and panic attacks in the past (ED visits). Her anxiety and condition improved after IV Ativan. EKG negative for any acute ischemic changes however QTc and PR was prolonged, however, there are no previous EKG's for comparison. No complaints of chest pain. Bilateral hand numbness and tingling likely due to anxiety and this resolved throughout her ED visit. No focal neurological deficits. Patient instructed to follow-up with her PCP. Return precautions, discharge instructions, and follow-up was discussed with the patient before discharge.    Rechecks  11:50 AM = Patient states she feels much better. Will re-check  12:45 AM = Patient states "I just dozed off." Denies any abdominal pain or hand numbness/tingling. Ready for discharge. Patient currently on menstual period      Discharge Medication List as of 08/04/2013 12:53 PM       Final impressions: 1. Anxiety   2. Abdominal pain   3. Nausea and vomiting       Mercy Moore PA-C   This patient was discussed with Dr. Nonda Lou, PA-C 08/06/13 [phone]

## 2013-08-04 NOTE — ED Notes (Signed)
PA at bedside.

## 2013-08-05 ENCOUNTER — Ambulatory Visit (INDEPENDENT_AMBULATORY_CARE_PROVIDER_SITE_OTHER): Payer: 59 | Admitting: Family Medicine

## 2013-08-05 ENCOUNTER — Ambulatory Visit: Payer: PRIVATE HEALTH INSURANCE | Attending: Internal Medicine

## 2013-08-05 VITALS — BP 124/76 | HR 78 | Temp 98.7°F | Resp 16 | Ht 65.0 in | Wt 115.4 lb

## 2013-08-05 DIAGNOSIS — F411 Generalized anxiety disorder: Secondary | ICD-10-CM

## 2013-08-05 DIAGNOSIS — F410 Panic disorder [episodic paroxysmal anxiety] without agoraphobia: Secondary | ICD-10-CM

## 2013-08-05 MED ORDER — CITALOPRAM HYDROBROMIDE 20 MG PO TABS: 20.0000 mg | ORAL_TABLET | Freq: Every day | ORAL | Status: DC

## 2013-08-05 MED ORDER — LORAZEPAM 1 MG PO TABS: 1.0000 mg | ORAL_TABLET | Freq: Two times a day (BID) | ORAL | Status: DC | PRN

## 2013-08-05 NOTE — Progress Notes (Signed)
Subjective:    Patient ID: Dawn Peters, female    DOB: Sep 01, 1980, 33 y.o.   MRN: [id number] This chart was scribed for Delman Cheadle by Randa Evens, ED Scribe. This Patient was seen in room 04 and the patients care was started at 5:31 PM  Chief Complaint  Patient presents with  . Anxiety    HPI Dawn Peters is a 33 y.o. female  Pt has been seen multiple times in urgent medical and Er for anxiety. She established care at [facility] 2 months prior with Dr. Allyson Sabal. She has multiple panic attacks. Was last seen in the Er yesterday for abdominal pain, vomiting, and dizziness followed by a panic attack. She was started on Celexa about 2 weeks prior by her neurologist at [facility] but was concerned it was not working. She was on lorazepam which was working but ran out. Unsure if she was treated or prescribed anything in the ER last note chart notes were not complete.   States she woke up this morning and felt like she was going to have a panic attack this morning. She states that yesterday she was alone and called 911 because she thought she was having  panic attack symptoms. She states she attempted to call her Neurologist to get her prescription filled but he is currently could not see her until her scheduled appointment. She states that she told to come to urgent medical.  She states that she is here today to get her prescription filled for her panic attacks. She states that she wants to handle her panic attack better instead of always going to the ER for when she has a panic attack episode. She states that when she is having an attack she has associated numbness, light headedness, and dizziness. She states that her panic attacks started last year after attending a funeral. She states that sometimes she tends to worry a lot about different things going on in life. She states that she worries about her sons safety a lot even though he is a good kid. She states that she  wants to go up to 20mg  on her citalopram prescription. She states she believes that her dosage was too low and not effective.    Past Medical History  Diagnosis Date  . MS (multiple sclerosis)   . Hypertension   . Anxiety   . Depression    Current Outpatient Prescriptions on File Prior to Visit  Medication Sig Dispense Refill  . baclofen (LIORESAL) 20 MG tablet Take 20 mg by mouth 3 (three) times daily as needed (for ms).       . citalopram (CELEXA) 10 MG tablet Take 10 mg by mouth daily.       . Dimethyl Fumarate (TECFIDERA) 240 MG CPDR Take 240 mg by mouth 2 (two) times daily.      Marland Kitchen gabapentin (NEURONTIN) 800 MG tablet Take 800 mg by mouth 3 (three) times daily.      Marland Kitchen lisinopril (PRINIVIL,ZESTRIL) 20 MG tablet Take 20 mg by mouth daily.      . Multiple Vitamin (MULTIVITAMIN WITH MINERALS) TABS Take 1 tablet by mouth daily.      . norethindrone (CAMILA) 0.35 MG tablet Take 1 tablet (0.35 mg total) by mouth daily.  1 Package  3  . omeprazole (PRILOSEC) 20 MG capsule Take 20 mg by mouth daily as needed (heart burn).       Marland Kitchen OVER THE COUNTER MEDICATION Take 1 capsule by mouth  2 (two) times daily. Bee Capsule      . oxybutynin (DITROPAN) 5 MG tablet Take 5 mg by mouth at bedtime.       . OxyCODONE HCl, Abuse Deter, 5 MG TABA Take 5 mg by mouth 3 (three) times daily as needed (pain).      Marland Kitchen tiZANidine (ZANAFLEX) 4 MG tablet Take 4 mg by mouth at bedtime.        No current facility-administered medications on file prior to visit.   No Known Allergies  Review of Systems  Neurological: Positive for dizziness, light-headedness and numbness.  Psychiatric/Behavioral: The patient is nervous/anxious.     BP 124/76  Pulse 78  Temp(Src) 98.7 F (37.1 C) (Oral)  Resp 16  Ht 5\' 5"  (1.651 m)  Wt 115 lb 6.4 oz (52.345 kg)  BMI 19.20 kg/m2  SpO2 99%  LMP 08/01/2013  Objective:    Physical Exam  Nursing note and vitals reviewed. Constitutional: She is oriented to person, place, and  time. She appears well-developed and well-nourished. No distress.  HENT:  Head: Normocephalic and atraumatic.  Eyes: EOM are normal.  Neck: Neck supple.  Cardiovascular: Normal rate.   Pulmonary/Chest: Effort normal. No respiratory distress.  Musculoskeletal: Normal range of motion.  Neurological: She is alert and oriented to person, place, and time.  Skin: Skin is warm and dry.  Psychiatric: She has a normal mood and affect. Her behavior is normal.     Assessment & Plan:   5:38 PM-[place] controlled substance reporting system reviewed  NO controlled substance found, however I do not believe this is accurate because she was prescribed lorazepam 2 moths prior here at urgent medical.   Generalized anxiety disorder  Panic attack  Meds ordered this encounter  Medications  . DISCONTD: citalopram (CELEXA) 20 MG tablet    Sig: Take 1 tablet (20 mg total) by mouth daily.    Dispense:  30 tablet    Refill:  1  . LORazepam (ATIVAN) 1 MG tablet    Sig: Take 1 tablet (1 mg total) by mouth 2 (two) times daily as needed for anxiety.    Dispense:  20 tablet    Refill:  1    I personally performed the services described in this documentation, which was scribed in my presence. The recorded information has been reviewed and considered, and addended by me as needed.  Delman Cheadle, MD MPH

## 2013-08-05 NOTE — Patient Instructions (Signed)
Call one of the below groups.  At this point, you do NOT HAVE to see a psychiatrist (MD to prescribe psychiatric medications) unless you want to as I am happy to do that for you for now.  You DO need a therapist, counselor, psychologist, or social worker to help you learn different ways to BEAT the panic attacks and anxiety so we can get you feeling back to your self.  If that doesn't work or the therapist thinks that you need to see a specialist MD than that is fine. [place]  [address], [place], [place] [address]  [phone]  [place]  Address: [address], [place], [place] [address]  [phone]  [facility] Address: [address], [place], [place] [address]  Phone:([phone]) [place], [id number] Phone: [phone]  Panic Attacks Panic attacks are sudden, short-livedsurges of severe anxiety, fear, or discomfort. They may occur for no reason when you are relaxed, when you are anxious, or when you are sleeping. Panic attacks may occur for a number of reasons:   Healthy people occasionally have panic attacks in extreme, life-threatening situations, such as war or natural disasters. Normal anxiety is a protective mechanism of the body that helps [place] react to danger (fight or flight response).  Panic attacks are often seen with anxiety disorders, such as panic disorder, social anxiety disorder, generalized anxiety disorder, and phobias. Anxiety disorders cause excessive or uncontrollable anxiety. They may interfere with your relationships or other life activities.  Panic attacks are sometimes seen with other mental illnesses such as depression and posttraumatic stress disorder.  Certain medical conditions, prescription medicines, and drugs of abuse can cause panic attacks. SYMPTOMS  Panic attacks start suddenly, peak within 20  minutes, and are accompanied by four or more of the following symptoms:  Pounding heart or fast heart rate (palpitations).  Sweating.  Trembling or shaking.  Shortness of breath or feeling smothered.  Feeling choked.  Chest pain or discomfort.  Nausea or strange feeling in your stomach.  Dizziness, lightheadedness, or feeling like you will faint.  Chills or hot flushes.  Numbness or tingling in your lips or hands and feet.  Feeling that things are not real or feeling that you are not yourself.  Fear of losing control or going crazy.  Fear of dying. Some of these symptoms can mimic serious medical conditions. For example, you may think you are having a heart attack. Although panic attacks can be very scary, they are not life threatening. DIAGNOSIS  Panic attacks are diagnosed through an assessment by your health care provider. Your health care provider will ask questions about your symptoms, such as where and when they occurred. Your health care provider will also ask about your medical history and use of alcohol and drugs, including prescription medicines. Your health care provider may order blood tests or other studies to rule out a serious medical condition. Your health care provider may refer you to a mental health professional for further evaluation. TREATMENT   Most healthy people who have one or two panic attacks in an extreme, life-threatening situation will not require treatment.  The treatment for panic attacks associated with anxiety disorders or other mental illness typically involves counseling with a mental health professional, medicine, or a combination of both. Your health care provider will help determine what treatment is best for you.  Panic attacks due to physical illness usually goes  away with treatment of the illness. If prescription medicine is causing panic attacks, talk with your health care provider about stopping the medicine, decreasing the dose, or  substituting another medicine.  Panic attacks due to alcohol or drug abuse goes away with abstinence. Some adults need professional help in order to stop drinking or using drugs. HOME CARE INSTRUCTIONS   Take all your medicines as prescribed.   Check with your health care provider before starting new prescription or over-the-counter medicines.  Keep all follow up appointments with your health care provider. SEEK MEDICAL CARE IF:  You are not able to take your medicines as prescribed.  Your symptoms do not improve or get worse. SEEK IMMEDIATE MEDICAL CARE IF:   You experience panic attack symptoms that are different than your usual symptoms.  You have serious thoughts about hurting yourself or others.  You are taking medicine for panic attacks and have a serious side effect. MAKE SURE YOU:  Understand these instructions.  Will watch your condition.  Will get help right away if you are not doing well or get worse. Document Released: 02/21/2005 Document Revised: 12/12/2012 Document Reviewed: 10/05/2012 [facility] Patient Information 2014 [place]. Generalized Anxiety Disorder Generalized anxiety disorder (GAD) is a mental disorder. It interferes with life functions, including relationships, work, and school. GAD is different from normal anxiety, which everyone experiences at some point in their lives in response to specific life events and activities. Normal anxiety actually helps [place] prepare for and get through these life events and activities. Normal anxiety goes away after the event or activity is over.  GAD causes anxiety that is not necessarily related to specific events or activities. It also causes excess anxiety in proportion to specific events or activities. The anxiety associated with GAD is also difficult to control. GAD can vary from mild to severe. People with severe GAD can have intense waves of anxiety with physical symptoms (panic attacks).  SYMPTOMS The anxiety  and worry associated with GAD are difficult to control. This anxiety and worry are related to many life events and activities and also occur more days than not for 6 months or longer. People with GAD also have three or more of the following symptoms (one or more in children):  Restlessness.   Fatigue.  Difficulty concentrating.   Irritability.  Muscle tension.  Difficulty sleeping or unsatisfying sleep. DIAGNOSIS GAD is diagnosed through an assessment by your caregiver. Your caregiver will ask you questions aboutyour mood,physical symptoms, and events in your life. Your caregiver may ask you about your medical history and use of alcohol or drugs, including prescription medications. Your caregiver may also do a physical exam and blood tests. Certain medical conditions and the use of certain substances can cause symptoms similar to those associated with GAD. Your caregiver may refer you to a mental health specialist for further evaluation. TREATMENT The following therapies are usually used to treat GAD:   Medication Antidepressant medication usually is prescribed for long-term daily control. Antianxiety medications may be added in severe cases, especially when panic attacks occur.   Talk therapy (psychotherapy) Certain types of talk therapy can be helpful in treating GAD by providing support, education, and guidance. A form of talk therapy called cognitive behavioral therapy can teach you healthy ways to think about and react to daily life events and activities.  Stress managementtechniques These include yoga, meditation, and exercise and can be very helpful when they are practiced regularly. A mental health specialist can help determine which treatment  is best for you. Some people see improvement with one therapy. However, other people require a combination of therapies. Document Released: 06/18/2012 Document Reviewed: 06/18/2012 [facility] Patient Information 2014 [place], [place].

## 2013-08-07 ENCOUNTER — Ambulatory Visit
Admission: RE | Admit: 2013-08-07 | Discharge: 2013-08-07 | Disposition: A | Discharge status: Home / Self Care | Payer: PRIVATE HEALTH INSURANCE | Source: Ambulatory Visit | Attending: Specialist | Admitting: Specialist

## 2013-08-07 DIAGNOSIS — G35 Multiple sclerosis: Secondary | ICD-10-CM

## 2013-08-07 MED ORDER — GADOBENATE DIMEGLUMINE 529 MG/ML IV SOLN
11.0000 mL | Freq: Once | INTRAVENOUS | Status: AC | PRN
  Administered 2013-08-07: 11 mL via INTRAVENOUS

## 2013-08-07 NOTE — ED Provider Notes (Signed)
Medical screening examination/treatment/procedure(s) were performed by non-physician practitioner and as supervising physician I was immediately available for consultation/collaboration.   EKG Interpretation   Date/Time:  Sunday Aug 04 2013 08:25:37 EDT Ventricular Rate:  96 PR Interval:  216 QRS Duration: 86 QT Interval:  433 QTC Calculation: 547 R Axis:   54 Text Interpretation:  Sinus rhythm Prolonged PR interval Borderline T  abnormalities, inferior leads Prolonged QT interval ED PHYSICIAN  INTERPRETATION AVAILABLE IN [facility] Confirmed by TEST, Record  ([id number]) on 08/06/2013 7:09:05 AM        Blanchie Dessert, MD 08/07/13 1336

## 2013-08-08 ENCOUNTER — Ambulatory Visit: Payer: PRIVATE HEALTH INSURANCE | Attending: Physical Medicine and Rehabilitation

## 2013-08-08 DIAGNOSIS — M6281 Muscle weakness (generalized): Secondary | ICD-10-CM | POA: Insufficient documentation

## 2013-08-08 DIAGNOSIS — Z5189 Encounter for other specified aftercare: Secondary | ICD-10-CM | POA: Insufficient documentation

## 2013-08-08 DIAGNOSIS — R262 Difficulty in walking, not elsewhere classified: Secondary | ICD-10-CM | POA: Insufficient documentation

## 2013-08-08 DIAGNOSIS — M25559 Pain in unspecified hip: Secondary | ICD-10-CM | POA: Insufficient documentation

## 2013-08-12 ENCOUNTER — Encounter: Payer: PRIVATE HEALTH INSURANCE | Admitting: Rehabilitation

## 2013-08-14 ENCOUNTER — Ambulatory Visit: Payer: PRIVATE HEALTH INSURANCE | Admitting: Physical Therapy

## 2013-08-14 DIAGNOSIS — M25559 Pain in unspecified hip: Secondary | ICD-10-CM | POA: Diagnosis not present

## 2013-08-14 DIAGNOSIS — R262 Difficulty in walking, not elsewhere classified: Secondary | ICD-10-CM | POA: Diagnosis not present

## 2013-08-14 DIAGNOSIS — M6281 Muscle weakness (generalized): Secondary | ICD-10-CM | POA: Diagnosis not present

## 2013-08-14 DIAGNOSIS — Z5189 Encounter for other specified aftercare: Secondary | ICD-10-CM | POA: Diagnosis present

## 2013-08-15 ENCOUNTER — Emergency Department (INDEPENDENT_AMBULATORY_CARE_PROVIDER_SITE_OTHER)
Admission: EM | Admit: 2013-08-15 | Discharge: 2013-08-15 | Disposition: A | Discharge status: Home / Self Care | Payer: PRIVATE HEALTH INSURANCE | Source: Home / Self Care

## 2013-08-15 ENCOUNTER — Encounter (HOSPITAL_COMMUNITY): Payer: Self-pay | Admitting: Emergency Medicine

## 2013-08-15 DIAGNOSIS — J309 Allergic rhinitis, unspecified: Secondary | ICD-10-CM

## 2013-08-15 LAB — POCT RAPID STREP A: Streptococcus, Group A Screen (Direct): NEGATIVE

## 2013-08-15 MED ORDER — CETIRIZINE HCL 10 MG PO CAPS: ORAL_CAPSULE | ORAL | Status: DC

## 2013-08-15 NOTE — Discharge Instructions (Signed)
Allergic Rhinitis Zyrtec Sudafed PE for congestion flonase and saline nasal sprays Allergic rhinitis is when the mucous membranes in the nose respond to allergens. Allergens are particles in the air that cause your body to have an allergic reaction. This causes you to release allergic antibodies. Through a chain of events, these eventually cause you to release histamine into the blood stream. Although meant to protect the body, it is this release of histamine that causes your discomfort, such as frequent sneezing, congestion, and an itchy, runny nose.  CAUSES  Seasonal allergic rhinitis (hay fever) is caused by pollen allergens that may come from grasses, trees, and weeds. Year-round allergic rhinitis (perennial allergic rhinitis) is caused by allergens such as house dust mites, pet dander, and mold spores.  SYMPTOMS   Nasal stuffiness (congestion).  Itchy, runny nose with sneezing and tearing of the eyes. DIAGNOSIS  Your health care provider can help you determine the allergen or allergens that trigger your symptoms. If you and your health care provider are unable to determine the allergen, skin or blood testing may be used. TREATMENT  Allergic Rhinitis does not have a cure, but it can be controlled by:  Medicines and allergy shots (immunotherapy).  Avoiding the allergen. Hay fever may often be treated with antihistamines in pill or nasal spray forms. Antihistamines block the effects of histamine. There are over-the-counter medicines that may help with nasal congestion and swelling around the eyes. Check with your health care provider before taking or giving this medicine.  If avoiding the allergen or the medicine prescribed do not work, there are many new medicines your health care provider can prescribe. Stronger medicine may be used if initial measures are ineffective. Desensitizing injections can be used if medicine and avoidance does not work. Desensitization is when a patient is given  ongoing shots until the body becomes less sensitive to the allergen. Make sure you follow up with your health care provider if problems continue. HOME CARE INSTRUCTIONS It is not possible to completely avoid allergens, but you can reduce your symptoms by taking steps to limit your exposure to them. It helps to know exactly what you are allergic to so that you can avoid your specific triggers. SEEK MEDICAL CARE IF:   You have a fever.  You develop a cough that does not stop easily (persistent).  You have shortness of breath.  You start wheezing.  Symptoms interfere with normal daily activities. Document Released: 11/16/2000 Document Revised: 12/12/2012 Document Reviewed: 10/29/2012 [facility] Patient Information 2014 [place].

## 2013-08-15 NOTE — ED Provider Notes (Signed)
CSN: [id number]     Arrival date & time 08/15/13  0865 History   First MD Initiated Contact with Patient 08/15/13 1018     Chief Complaint  Patient presents with  . Sore Throat   (Consider location/radiation/quality/duration/timing/severity/associated sxs/prior Treatment) HPI Comments: 33 year old female complaining of scratchy throat, sore throat, cough, coughing green sputum and laryngitis for 4 days. Denies fever or shortness of breath.  Patient is a 33 y.o. female presenting with pharyngitis.  Sore Throat Pertinent negatives include no shortness of breath.    Past Medical History  Diagnosis Date  . MS (multiple sclerosis)   . Hypertension   . Anxiety   . Depression    Past Surgical History  Procedure Laterality Date  . Bilateral hip arthroscopy    . Joint replacement     History reviewed. No pertinent family history. History  Substance Use Topics  . Smoking status: Never Smoker   . Smokeless tobacco: Never Used  . Alcohol Use: No   OB History   Grav Para Term Preterm Abortions TAB SAB Ect Mult Living   1 1 1       1      Review of Systems  Constitutional: Negative.  Negative for fever, chills, activity change, appetite change and fatigue.  HENT: Positive for congestion, postnasal drip, rhinorrhea and sore throat. Negative for facial swelling.   Eyes: Negative.   Respiratory: Positive for cough. Negative for shortness of breath.   Cardiovascular: Negative.   Gastrointestinal: Positive for nausea.  Genitourinary: Negative.   Musculoskeletal: Negative for neck pain and neck stiffness.  Skin: Negative for pallor and rash.  Neurological: Negative.     Allergies  Review of patient's allergies indicates no known allergies.  Home Medications   Prior to Admission medications   Medication Sig Start Date End Date Taking? Authorizing Provider  baclofen (LIORESAL) 20 MG tablet Take 20 mg by mouth 3 (three) times daily as needed (for ms).     Historical Provider, MD   citalopram (CELEXA) 20 MG tablet Take 1 tablet (20 mg total) by mouth daily. 08/05/13   Shawnee Knapp, MD  Dimethyl Fumarate (TECFIDERA) 240 MG CPDR Take 240 mg by mouth 2 (two) times daily.    Historical Provider, MD  gabapentin (NEURONTIN) 800 MG tablet Take 800 mg by mouth 3 (three) times daily.    Historical Provider, MD  lisinopril (PRINIVIL,ZESTRIL) 20 MG tablet Take 20 mg by mouth daily.    Historical Provider, MD  LORazepam (ATIVAN) 1 MG tablet Take 1 tablet (1 mg total) by mouth 2 (two) times daily as needed for anxiety. 08/05/13   Shawnee Knapp, MD  Multiple Vitamin (MULTIVITAMIN WITH MINERALS) TABS Take 1 tablet by mouth daily.    Historical Provider, MD  norethindrone (CAMILA) 0.35 MG tablet Take 1 tablet (0.35 mg total) by mouth daily. 09/18/12 09/18/13  Donnamae Jude, MD  omeprazole (PRILOSEC) 20 MG capsule Take 20 mg by mouth daily as needed (heart burn).  07/10/13   Historical Provider, MD  OVER THE COUNTER MEDICATION Take 1 capsule by mouth 2 (two) times daily. Bee Capsule    Historical Provider, MD  oxybutynin (DITROPAN) 5 MG tablet Take 5 mg by mouth at bedtime.  07/10/13   Historical Provider, MD  OxyCODONE HCl, Abuse Deter, 5 MG TABA Take 5 mg by mouth 3 (three) times daily as needed (pain).    Historical Provider, MD  tiZANidine (ZANAFLEX) 4 MG tablet Take 4 mg by mouth at bedtime.  07/07/13   Historical Provider, MD   BP 119/83  Pulse 90  Temp(Src) 98.3 F (36.8 C) (Oral)  Resp 18  SpO2 98%  LMP 08/01/2013 Physical Exam  Nursing note and vitals reviewed. Constitutional: She is oriented to person, place, and time. She appears well-developed and well-nourished. No distress.  HENT:  Head: Normocephalic.  Right Ear: External ear normal.  Left Ear: External ear normal.  Mouth/Throat: No oropharyngeal exudate.  Minor erythema of the posterior oropharynx. More and streaky type fashion. No exudates or edema  Eyes: Conjunctivae and EOM are normal.  Neck: Normal range of motion. Neck  supple.  Cardiovascular: Normal rate and regular rhythm.   Pulmonary/Chest: Effort normal and breath sounds normal. No respiratory distress.  Musculoskeletal: Normal range of motion.  Lymphadenopathy:    She has no cervical adenopathy.  Neurological: She is alert and oriented to person, place, and time. No cranial nerve deficit.  Skin: Skin is warm and dry.  Psychiatric: She has a normal mood and affect.    ED Course  Procedures (including critical care time) Labs Review Labs Reviewed  POCT RAPID STREP A ([facility] URG CARE ONLY)    Imaging Review No results found.   MDM   1. Allergic rhinitis     certrizine  Sudafed PE , flonase nasal spray, saline nasal spray   Janne Napoleon, NP 08/15/13 1105

## 2013-08-15 NOTE — ED Notes (Signed)
Pt  Reports      Symptoms  Of  sorethroat  /  Congestion     With   Slight  Cough     X  4  Days      Sitting  Upright on the  Exam table  In no  Severe  Distress

## 2013-08-17 LAB — CULTURE, GROUP A STREP

## 2013-08-19 ENCOUNTER — Ambulatory Visit: Payer: PRIVATE HEALTH INSURANCE | Admitting: Physical Therapy

## 2013-08-19 DIAGNOSIS — Z5189 Encounter for other specified aftercare: Secondary | ICD-10-CM | POA: Diagnosis not present

## 2013-08-21 NOTE — ED Provider Notes (Signed)
Medical screening examination/treatment/procedure(s) were performed by a resident physician or non-physician practitioner and as the supervising physician I was immediately available for consultation/collaboration.  Evan Corey, MD    Evan S Corey, MD 08/21/13 0733 

## 2013-08-28 ENCOUNTER — Ambulatory Visit: Payer: PRIVATE HEALTH INSURANCE | Admitting: Physical Therapy

## 2013-08-30 ENCOUNTER — Ambulatory Visit: Payer: PRIVATE HEALTH INSURANCE | Admitting: Physical Therapy

## 2013-08-30 DIAGNOSIS — Z5189 Encounter for other specified aftercare: Secondary | ICD-10-CM | POA: Diagnosis not present

## 2013-09-02 ENCOUNTER — Encounter: Payer: Self-pay | Admitting: Internal Medicine

## 2013-09-02 ENCOUNTER — Ambulatory Visit: Payer: PRIVATE HEALTH INSURANCE | Attending: Internal Medicine | Admitting: Internal Medicine

## 2013-09-02 VITALS — BP 130/80 | HR 83 | Temp 98.0°F | Resp 16 | Wt 115.4 lb

## 2013-09-02 DIAGNOSIS — I10 Essential (primary) hypertension: Secondary | ICD-10-CM

## 2013-09-02 DIAGNOSIS — N898 Other specified noninflammatory disorders of vagina: Secondary | ICD-10-CM

## 2013-09-02 DIAGNOSIS — F3289 Other specified depressive episodes: Secondary | ICD-10-CM | POA: Diagnosis not present

## 2013-09-02 DIAGNOSIS — G35 Multiple sclerosis: Secondary | ICD-10-CM | POA: Diagnosis not present

## 2013-09-02 DIAGNOSIS — Z79899 Other long term (current) drug therapy: Secondary | ICD-10-CM | POA: Diagnosis not present

## 2013-09-02 DIAGNOSIS — F411 Generalized anxiety disorder: Secondary | ICD-10-CM | POA: Insufficient documentation

## 2013-09-02 DIAGNOSIS — F329 Major depressive disorder, single episode, unspecified: Secondary | ICD-10-CM | POA: Diagnosis not present

## 2013-09-02 LAB — POCT URINALYSIS DIPSTICK
Bilirubin, UA: NEGATIVE
GLUCOSE UA: NEGATIVE
KETONES UA: NEGATIVE
Nitrite, UA: NEGATIVE
Protein, UA: NEGATIVE
SPEC GRAV UA: 1.025
UROBILINOGEN UA: 0.2
pH, UA: 5

## 2013-09-02 MED ORDER — METRONIDAZOLE 500 MG PO TABS: 500.0000 mg | ORAL_TABLET | Freq: Three times a day (TID) | ORAL | Status: DC

## 2013-09-02 NOTE — Progress Notes (Signed)
MRN: [id number] Name: Dawn Peters  Sex: female Age: 33 y.o. DOB: 1980/08/11  Allergies: Review of patient's allergies indicates no known allergies.  Chief Complaint  Patient presents with  . Follow-up    HPI: Patient is 33 y.o. female who has to of hypertension  comes today for followup, she reported to have vaginal discharge which is foul-smelling fishy odor for the last few days, as per patient she has history of mitral infection and was prescribed Flagyl in the past which helped her with the symptoms, denies any fever chills nausea vomiting any dysuria. For hypertension she has been taking lisinopril 20 mg and her blood pressure is well controlled manual is 130/80, for her MS she is following up with neurologist.  Past Medical History  Diagnosis Date  . MS (multiple sclerosis)   . Hypertension   . Anxiety   . Depression     Past Surgical History  Procedure Laterality Date  . Bilateral hip arthroscopy    . Joint replacement        Medication List       This list is accurate as of: 09/02/13 11:46 AM.  Always use your most recent med list.               baclofen 20 MG tablet  Commonly known as:  LIORESAL  Take 20 mg by mouth 3 (three) times daily as needed (for ms).     Cetirizine HCl 10 MG Caps  Take 10 mg q d prn allergies     citalopram 20 MG tablet  Commonly known as:  CELEXA  Take 1 tablet (20 mg total) by mouth daily.     gabapentin 800 MG tablet  Commonly known as:  NEURONTIN  Take 800 mg by mouth 3 (three) times daily.     lisinopril 20 MG tablet  Commonly known as:  PRINIVIL,ZESTRIL  Take 20 mg by mouth daily.     LORazepam 1 MG tablet  Commonly known as:  ATIVAN  Take 1 tablet (1 mg total) by mouth 2 (two) times daily as needed for anxiety.     metroNIDAZOLE 500 MG tablet  Commonly known as:  FLAGYL  Take 1 tablet (500 mg total) by mouth 3 (three) times daily.     multivitamin with minerals Tabs tablet  Take 1 tablet by mouth  daily.     norethindrone 0.35 MG tablet  Commonly known as:  CAMILA  Take 1 tablet (0.35 mg total) by mouth daily.     omeprazole 20 MG capsule  Commonly known as:  PRILOSEC  Take 20 mg by mouth daily as needed (heart burn).     OVER THE COUNTER MEDICATION  Take 1 capsule by mouth 2 (two) times daily. Bee Capsule     oxybutynin 5 MG tablet  Commonly known as:  DITROPAN  Take 5 mg by mouth at bedtime.     OxyCODONE HCl (Abuse Deter) 5 MG Taba  Take 5 mg by mouth 3 (three) times daily as needed (pain).     TECFIDERA 240 MG Cpdr  Generic drug:  Dimethyl Fumarate  Take 240 mg by mouth 2 (two) times daily.     tiZANidine 4 MG tablet  Commonly known as:  ZANAFLEX  Take 4 mg by mouth at bedtime.        Meds ordered this encounter  Medications  . metroNIDAZOLE (FLAGYL) 500 MG tablet    Sig: Take 1 tablet (500 mg total) by mouth 3 (  three) times daily.    Dispense:  21 tablet    Refill:  0    Immunization History  Administered Date(s) Administered  . PPD Test 10/01/2012    History reviewed. No pertinent family history.  History  Substance Use Topics  . Smoking status: Never Smoker   . Smokeless tobacco: Never Used  . Alcohol Use: No    Review of Systems   As noted in HPI  Filed Vitals:   09/02/13 1142  BP: 130/80  Pulse:   Temp:   Resp:     Physical Exam  Physical Exam  Cardiovascular: Normal rate and regular rhythm.   Pulmonary/Chest: Breath sounds normal. No respiratory distress. She has no wheezes. She has no rales.  Abdominal: Soft. There is no tenderness. There is no rebound.  No CVA tenderness     CBC    Component Value Date/Time   WBC 4.8 08/04/2013 0950   RBC 4.36 08/04/2013 0950   HGB 14.8 08/04/2013 0950   HCT 41.3 08/04/2013 0950   PLT 210 08/04/2013 0950   MCV 94.7 08/04/2013 0950   LYMPHSABS 0.5* 08/04/2013 0950   MONOABS 0.3 08/04/2013 0950   EOSABS 0.0 08/04/2013 0950   BASOSABS 0.0 08/04/2013 0950    CMP     Component Value  Date/Time   NA 141 08/04/2013 0950   K 3.6* 08/04/2013 0950   CL 102 08/04/2013 0950   CO2 28 08/04/2013 0950   GLUCOSE 87 08/04/2013 0950   BUN 16 08/04/2013 0950   CREATININE 0.83 08/04/2013 0950   CREATININE 0.81 05/31/2013 1020   CALCIUM 9.4 08/04/2013 0950   PROT 7.5 08/04/2013 0950   ALBUMIN 4.4 08/04/2013 0950   AST 19 08/04/2013 0950   ALT 10 08/04/2013 0950   ALKPHOS 54 08/04/2013 0950   BILITOT 0.7 08/04/2013 0950   GFRNONAA >90 08/04/2013 0950   GFRNONAA >89 05/31/2013 1020   GFRAA >90 08/04/2013 0950   GFRAA >89 05/31/2013 1020    No results found for this basename: chol, tri, ldl    No components found with this basename: hga1c    Lab Results  Component Value Date/Time   AST 19 08/04/2013  9:50 AM    Assessment and Plan  Vaginal discharge - Plan: Urinalysis Dipstick, Results for orders placed in visit on 09/02/13  POCT URINALYSIS DIPSTICK      Result Value Ref Range   Color, UA yellow     Clarity, UA clear     Glucose, UA neg     Bilirubin, UA neg     Ketones, UA neg     Spec Grav, UA 1.025     Blood, UA trace-intact     pH, UA 5.0     Protein, UA neg     Urobilinogen, UA 0.2     Nitrite, UA neg     Leukocytes, UA Trace     Most likely patient has mitral vaginosis, we'll prescribe metroNIDAZOLE (FLAGYL) 500 MG tablet daily for 7 days  Essential hypertension, benign Blood pressure is well controlled continue with the lisinopril 20 mg, also advise for DASH diet. We'll do blood chemistry on next visit.    Return in about 3 months (around 12/03/2013).  [name], MD

## 2013-09-02 NOTE — Patient Instructions (Signed)
DASH Eating Plan  DASH stands for "Dietary Approaches to Stop Hypertension." The DASH eating plan is a healthy eating plan that has been shown to reduce high blood pressure (hypertension). Additional health benefits may include reducing the risk of type 2 diabetes mellitus, heart disease, and stroke. The DASH eating plan may also help with weight loss.  WHAT DO I NEED TO KNOW ABOUT THE DASH EATING PLAN?  For the DASH eating plan, you will follow these general guidelines:  · Choose foods with a percent daily value for sodium of less than 5% (as listed on the food label).  · Use salt-free seasonings or herbs instead of table salt or sea salt.  · Check with your health care provider or pharmacist before using salt substitutes.  · Eat lower-sodium products, often labeled as "lower sodium" or "no salt added."  · Eat fresh foods.  · Eat more vegetables, fruits, and low-fat dairy products.  · Choose whole grains. Look for the word "whole" as the first word in the ingredient list.  · Choose fish and skinless chicken or turkey more often than red meat. Limit fish, poultry, and meat to 6 oz (170 g) each day.  · Limit sweets, desserts, sugars, and sugary drinks.  · Choose heart-healthy fats.  · Limit cheese to 1 oz (28 g) per day.  · Eat more home-cooked food and less restaurant, buffet, and fast food.  · Limit fried foods.  · Cook foods using methods other than frying.  · Limit canned vegetables. If you do use them, rinse them well to decrease the sodium.  · When eating at a restaurant, ask that your food be prepared with less salt, or no salt if possible.  WHAT FOODS CAN I EAT?  Seek help from a dietitian for individual calorie needs.  Grains  Whole grain or whole wheat bread. Brown rice. Whole grain or whole wheat pasta. Quinoa, bulgur, and whole grain cereals. Low-sodium cereals. Corn or whole wheat flour tortillas. Whole grain cornbread. Whole grain crackers. Low-sodium crackers.  Vegetables  Fresh or frozen vegetables  (raw, steamed, roasted, or grilled). Low-sodium or reduced-sodium tomato and vegetable juices. Low-sodium or reduced-sodium tomato sauce and paste. Low-sodium or reduced-sodium canned vegetables.   Fruits  All fresh, canned (in natural juice), or frozen fruits.  Meat and Other Protein Products  Ground beef (85% or leaner), grass-fed beef, or beef trimmed of fat. Skinless chicken or turkey. Ground chicken or turkey. Pork trimmed of fat. All fish and seafood. Eggs. Dried beans, peas, or lentils. Unsalted nuts and seeds. Unsalted canned beans.  Dairy  Low-fat dairy products, such as skim or 1% milk, 2% or reduced-fat cheeses, low-fat ricotta or cottage cheese, or plain low-fat yogurt. Low-sodium or reduced-sodium cheeses.  Fats and Oils  Tub margarines without trans fats. Light or reduced-fat mayonnaise and salad dressings (reduced sodium). Avocado. Safflower, olive, or canola oils. Natural peanut or almond butter.  Other  Unsalted popcorn and pretzels.  The items listed above may not be a complete list of recommended foods or beverages. Contact your dietitian for more options.  WHAT FOODS ARE NOT RECOMMENDED?  Grains  White bread. White pasta. White rice. Refined cornbread. Bagels and croissants. Crackers that contain trans fat.  Vegetables  Creamed or fried vegetables. Vegetables in a cheese sauce. Regular canned vegetables. Regular canned tomato sauce and paste. Regular tomato and vegetable juices.  Fruits  Dried fruits. Canned fruit in light or heavy syrup. Fruit juice.  Meat and Other Protein   Products  Fatty cuts of meat. Ribs, chicken wings, bacon, sausage, bologna, salami, chitterlings, fatback, hot dogs, bratwurst, and packaged luncheon meats. Salted nuts and seeds. Canned beans with salt.  Dairy  Whole or 2% milk, cream, half-and-half, and cream cheese. Whole-fat or sweetened yogurt. Full-fat cheeses or blue cheese. Nondairy creamers and whipped toppings. Processed cheese, cheese spreads, or cheese  curds.  Condiments  Onion and garlic salt, seasoned salt, table salt, and sea salt. Canned and packaged gravies. Worcestershire sauce. Tartar sauce. Barbecue sauce. Teriyaki sauce. Soy sauce, including reduced sodium. Steak sauce. Fish sauce. Oyster sauce. Cocktail sauce. Horseradish. Ketchup and mustard. Meat flavorings and tenderizers. Bouillon cubes. Hot sauce. Tabasco sauce. Marinades. Taco seasonings. Relishes.  Fats and Oils  Butter, stick margarine, lard, shortening, ghee, and bacon fat. Coconut, palm kernel, or palm oils. Regular salad dressings.  Other  Pickles and olives. Salted popcorn and pretzels.  The items listed above may not be a complete list of foods and beverages to avoid. Contact your dietitian for more information.  WHERE CAN I FIND MORE INFORMATION?  National Heart, Lung, and Blood Institute: www.nhlbi.nih.gov/health/health-topics/topics/dash/  Document Released: 02/10/2011 Document Revised: 02/26/2013 Document Reviewed: 12/26/2012  ExitCare® Patient Information ©2015 [employer]. This information is not intended to replace advice given to you by your health care provider. Make sure you discuss any questions you have with your health care provider.

## 2013-09-02 NOTE — Progress Notes (Signed)
Patient here for follow up on her HTN Complains of having some vaginal discharge with a fishy odor For the past two weeks

## 2013-09-03 ENCOUNTER — Ambulatory Visit: Payer: PRIVATE HEALTH INSURANCE | Admitting: Physical Therapy

## 2013-09-03 DIAGNOSIS — Z5189 Encounter for other specified aftercare: Secondary | ICD-10-CM | POA: Diagnosis not present

## 2013-09-05 ENCOUNTER — Encounter: Payer: PRIVATE HEALTH INSURANCE | Admitting: Physical Therapy

## 2013-09-10 ENCOUNTER — Ambulatory Visit: Payer: PRIVATE HEALTH INSURANCE | Attending: Physical Medicine and Rehabilitation | Admitting: Physical Therapy

## 2013-09-10 DIAGNOSIS — M25559 Pain in unspecified hip: Secondary | ICD-10-CM | POA: Diagnosis not present

## 2013-09-10 DIAGNOSIS — R262 Difficulty in walking, not elsewhere classified: Secondary | ICD-10-CM | POA: Diagnosis not present

## 2013-09-10 DIAGNOSIS — M6281 Muscle weakness (generalized): Secondary | ICD-10-CM | POA: Diagnosis not present

## 2013-09-10 DIAGNOSIS — Z5189 Encounter for other specified aftercare: Secondary | ICD-10-CM | POA: Insufficient documentation

## 2013-09-13 ENCOUNTER — Ambulatory Visit: Payer: PRIVATE HEALTH INSURANCE | Admitting: Physical Therapy

## 2013-09-17 ENCOUNTER — Encounter: Payer: PRIVATE HEALTH INSURANCE | Admitting: Physical Therapy

## 2013-09-18 ENCOUNTER — Telehealth: Payer: Self-pay | Admitting: General Practice

## 2013-09-18 DIAGNOSIS — Z3041 Encounter for surveillance of contraceptive pills: Secondary | ICD-10-CM

## 2013-09-18 NOTE — Telephone Encounter (Signed)
Pt called and stated that she has an appt on 11/01/13 could she get a refill on her BCP to hold until appt.   Called pt and informed pt that her refill on BCP has been sent to her [facility] off [address].  Pt said "thank you."

## 2013-09-19 ENCOUNTER — Ambulatory Visit: Payer: PRIVATE HEALTH INSURANCE | Admitting: Physical Therapy

## 2013-09-27 ENCOUNTER — Other Ambulatory Visit: Payer: Self-pay | Admitting: Family Medicine

## 2013-10-29 ENCOUNTER — Telehealth: Payer: Self-pay | Admitting: Internal Medicine

## 2013-10-29 NOTE — Telephone Encounter (Signed)
Pt  needs updated referral for orthopedic Dr for ongoing issue, last office visit was in June.  Please f/u with pt for more information.

## 2013-11-01 ENCOUNTER — Ambulatory Visit (INDEPENDENT_AMBULATORY_CARE_PROVIDER_SITE_OTHER): Payer: PRIVATE HEALTH INSURANCE | Admitting: Obstetrics & Gynecology

## 2013-11-01 ENCOUNTER — Other Ambulatory Visit: Payer: Self-pay | Admitting: Obstetrics & Gynecology

## 2013-11-01 ENCOUNTER — Encounter: Payer: Self-pay | Admitting: Obstetrics & Gynecology

## 2013-11-01 VITALS — BP 127/103 | HR 90 | Ht 65.0 in | Wt 124.5 lb

## 2013-11-01 DIAGNOSIS — Z3041 Encounter for surveillance of contraceptive pills: Secondary | ICD-10-CM

## 2013-11-01 DIAGNOSIS — Z01419 Encounter for gynecological examination (general) (routine) without abnormal findings: Secondary | ICD-10-CM

## 2013-11-01 DIAGNOSIS — Z113 Encounter for screening for infections with a predominantly sexual mode of transmission: Secondary | ICD-10-CM

## 2013-11-01 LAB — POCT URINALYSIS DIP (DEVICE)
BILIRUBIN URINE: NEGATIVE
Glucose, UA: NEGATIVE mg/dL
HGB URINE DIPSTICK: NEGATIVE
Ketones, ur: NEGATIVE mg/dL
Leukocytes, UA: NEGATIVE
Nitrite: NEGATIVE
Protein, ur: NEGATIVE mg/dL
Specific Gravity, Urine: 1.015 (ref 1.005–1.030)
Urobilinogen, UA: 1 mg/dL (ref 0.0–1.0)
pH: 7 (ref 5.0–8.0)

## 2013-11-01 LAB — POCT PREGNANCY, URINE: Preg Test, Ur: NEGATIVE

## 2013-11-01 MED ORDER — NORETHINDRONE 0.35 MG PO TABS: 1.0000 | ORAL_TABLET | Freq: Every day | ORAL | Status: DC

## 2013-11-01 NOTE — Patient Instructions (Signed)
Norethindrone tablets (contraception) What is this medicine? NORETHINDRONE (nor eth IN drone) is an oral contraceptive. The product contains a female hormone known as a progestin. It is used to prevent pregnancy. This medicine may be used for other purposes; ask your health care provider or pharmacist if you have questions. COMMON BRAND NAME(S): Camila, Deblitane 28-Day, Errin, Heather, [place], Joliv[place], [place], Nor-QD, Nora-BE, Norlyroc, [facility], [employer] 28-Day What should I tell my health care provider before I take this medicine? They need to know if you have any of these conditions: -blood vessel disease or blood clots -breast, cervical, or vaginal cancer -diabetes -heart disease -kidney disease -liver disease -mental depression -migraine -seizures -stroke -vaginal bleeding -an unusual or allergic reaction to norethindrone, other medicines, foods, dyes, or preservatives -pregnant or trying to get pregnant -breast-feeding How should I use this medicine? Take this medicine by mouth with a glass of water. You may take it with or without food. Follow the directions on the prescription label. Take this medicine at the same time each day and in the order directed on the package. Do not take your medicine more often than directed. Contact your pediatrician regarding the use of this medicine in children. Special care may be needed. This medicine has been used in female children who have started having menstrual periods. A patient package insert for the product will be given with each prescription and refill. Read this sheet carefully each time. The sheet may change frequently. Overdosage: If you think you have taken too much of this medicine contact a poison control center or emergency room at once. NOTE: This medicine is only for you. Do not share this medicine with others. What if I miss a dose? Try not to miss a dose. Every time you miss a dose or take a dose late your chance of  pregnancy increases. When 1 pill is missed (even if only 3 hours late), take the missed pill as soon as possible and continue taking a pill each day at the regular time (use a back up method of birth control for the next 48 hours). If more than 1 dose is missed, use an additional birth control method for the rest of your pill pack until menses occurs. Contact your health care professional if more than 1 dose has been missed. What may interact with this medicine? Do not take this medicine with any of the following medications: -amprenavir or fosamprenavir -bosentan This medicine may also interact with the following medications: -antibiotics or medicines for infections, especially rifampin, rifabutin, rifapentine, and griseofulvin, and possibly penicillins or tetracyclines -aprepitant -barbiturate medicines, such as phenobarbital -carbamazepine -felbamate -modafinil -oxcarbazepine -phenytoin -ritonavir or other medicines for HIV infection or AIDS -St. John's wort -topiramate This list may not describe all possible interactions. Give your health care provider a list of all the medicines, herbs, non-prescription drugs, or dietary supplements you use. Also tell them if you smoke, drink alcohol, or use illegal drugs. Some items may interact with your medicine. What should I watch for while using this medicine? Visit your doctor or health care professional for regular checks on your progress. You will need a regular breast and pelvic exam and Pap smear while on this medicine. Use an additional method of birth control during the first cycle that you take these tablets. If you have any reason to think you are pregnant, stop taking this medicine right away and contact your doctor or health care professional. If you are taking this medicine for hormone related problems, it  may take several cycles of use to see improvement in your condition. This medicine does not protect you against HIV infection (AIDS)  or any other sexually transmitted diseases. What side effects may I notice from receiving this medicine? Side effects that you should report to your doctor or health care professional as soon as possible: -breast tenderness or discharge -pain in the abdomen, chest, groin or leg -severe headache -skin rash, itching, or hives -sudden shortness of breath -unusually weak or tired -vision or speech problems -yellowing of skin or eyes Side effects that usually do not require medical attention (report to your doctor or health care professional if they continue or are bothersome): -changes in sexual desire -change in menstrual flow -facial hair growth -fluid retention and swelling -headache -irritability -nausea -weight gain or loss This list may not describe all possible side effects. Call your doctor for medical advice about side effects. You may report side effects to FDA at 1-800-FDA-1088. Where should I keep my medicine? Keep out of the reach of children. Store at room temperature between 15 and 30 degrees C (59 and 86 degrees F). Throw away any unused medicine after the expiration date. NOTE: This sheet is a summary. It may not cover all possible information. If you have questions about this medicine, talk to your doctor, pharmacist, or health care provider.  2015, Elsevier/Gold Standard. (2011-11-11 16:41:35)  DASH Eating Plan DASH stands for "Dietary Approaches to Stop Hypertension." The DASH eating plan is a healthy eating plan that has been shown to reduce high blood pressure (hypertension). Additional health benefits may include reducing the risk of type 2 diabetes mellitus, heart disease, and stroke. The DASH eating plan may also help with weight loss. WHAT DO I NEED TO KNOW ABOUT THE DASH EATING PLAN? For the DASH eating plan, you will follow these general guidelines:  Choose foods with a percent daily value for sodium of less than 5% (as listed on the food label).  Use  salt-free seasonings or herbs instead of table salt or sea salt.  Check with your health care provider or pharmacist before using salt substitutes.  Eat lower-sodium products, often labeled as "lower sodium" or "no salt added."  Eat fresh foods.  Eat more vegetables, fruits, and low-fat dairy products.  Choose whole grains. Look for the word "whole" as the first word in the ingredient list.  Choose fish and skinless chicken or [place] more often than red meat. Limit fish, poultry, and meat to 6 oz (170 g) each day.  Limit sweets, desserts, sugars, and sugary drinks.  Choose heart-healthy fats.  Limit cheese to 1 oz (28 g) per day.  Eat more home-cooked food and less restaurant, buffet, and fast food.  Limit fried foods.  Cook foods using methods other than frying.  Limit canned vegetables. If you do use them, rinse them well to decrease the sodium.  When eating at a restaurant, ask that your food be prepared with less salt, or no salt if possible. WHAT FOODS CAN I EAT? Seek help from a dietitian for individual calorie needs. Grains Whole grain or whole wheat bread. Brown rice. Whole grain or whole wheat pasta. Quinoa, bulgur, and whole grain cereals. Low-sodium cereals. Corn or whole wheat flour tortillas. Whole grain cornbread. Whole grain crackers. Low-sodium crackers. Vegetables Fresh or frozen vegetables (raw, steamed, roasted, or grilled). Low-sodium or reduced-sodium tomato and vegetable juices. Low-sodium or reduced-sodium tomato sauce and paste. Low-sodium or reduced-sodium canned vegetables.  Fruits All fresh, canned (in natural  juice), or frozen fruits. Meat and Other Protein Products Ground beef (85% or leaner), grass-fed beef, or beef trimmed of fat. Skinless chicken or [place]. Ground chicken or [place]. Pork trimmed of fat. All fish and seafood. Eggs. Dried beans, peas, or lentils. Unsalted nuts and seeds. Unsalted canned beans. Dairy Low-fat dairy products, such as  skim or 1% milk, 2% or reduced-fat cheeses, low-fat ricotta or cottage cheese, or plain low-fat yogurt. Low-sodium or reduced-sodium cheeses. Fats and Oils Tub margarines without trans fats. Light or reduced-fat mayonnaise and salad dressings (reduced sodium). Avocado. Safflower, olive, or canola oils. Natural peanut or almond butter. Other Unsalted popcorn and pretzels. The items listed above may not be a complete list of recommended foods or beverages. Contact your dietitian for more options. WHAT FOODS ARE NOT RECOMMENDED? Grains White bread. White pasta. White rice. Refined cornbread. Bagels and croissants. Crackers that contain trans fat. Vegetables Creamed or fried vegetables. Vegetables in a cheese sauce. Regular canned vegetables. Regular canned tomato sauce and paste. Regular tomato and vegetable juices. Fruits Dried fruits. Canned fruit in light or heavy syrup. Fruit juice. Meat and Other Protein Products Fatty cuts of meat. Ribs, chicken wings, bacon, sausage, bologna, salami, chitterlings, fatback, hot dogs, bratwurst, and packaged luncheon meats. Salted nuts and seeds. Canned beans with salt. Dairy Whole or 2% milk, cream, half-and-half, and cream cheese. Whole-fat or sweetened yogurt. Full-fat cheeses or blue cheese. Nondairy creamers and whipped toppings. Processed cheese, cheese spreads, or cheese curds. Condiments Onion and garlic salt, seasoned salt, table salt, and sea salt. Canned and packaged gravies. Worcestershire sauce. Tartar sauce. Barbecue sauce. Teriyaki sauce. Soy sauce, including reduced sodium. Steak sauce. Fish sauce. Oyster sauce. Cocktail sauce. Horseradish. Ketchup and mustard. Meat flavorings and tenderizers. Bouillon cubes. Hot sauce. Tabasco sauce. Marinades. Taco seasonings. Relishes. Fats and Oils Butter, stick margarine, lard, shortening, ghee, and bacon fat. Coconut, palm kernel, or palm oils. Regular salad dressings. Other Pickles and olives. Salted  popcorn and pretzels. The items listed above may not be a complete list of foods and beverages to avoid. Contact your dietitian for more information. WHERE CAN I FIND MORE INFORMATION? National Heart, Lung, and Blood Institute: travelstabloid.com Document Released: 02/10/2011 Document Revised: 07/08/2013 Document Reviewed: 12/26/2012 [facility] Patient Information 2015 [place], [place]. This information is not intended to replace advice given to you by your health care provider. Make sure you discuss any questions you have with your health care provider.  Hypertension Hypertension, commonly called high blood pressure, is when the force of blood pumping through your arteries is too strong. Your arteries are the blood vessels that carry blood from your heart throughout your body. A blood pressure reading consists of a higher number over a lower number, such as 110/72. The higher number (systolic) is the pressure inside your arteries when your heart pumps. The lower number (diastolic) is the pressure inside your arteries when your heart relaxes. Ideally you want your blood pressure below 120/80. Hypertension forces your heart to work harder to pump blood. Your arteries may become narrow or stiff. Having hypertension puts you at risk for heart disease, stroke, and other problems.  RISK FACTORS Some risk factors for high blood pressure are controllable. Others are not.  Risk factors you cannot control include:   Race. You may be at higher risk if you are African American.  Age. Risk increases with age.  Gender. Men are at higher risk than women before age 66 years. After age 23, women are at higher risk than  men. Risk factors you can control include:  Not getting enough exercise or physical activity.  Being overweight.  Getting too much fat, sugar, calories, or salt in your diet.  Drinking too much alcohol. SIGNS AND SYMPTOMS Hypertension does not usually cause  signs or symptoms. Extremely high blood pressure (hypertensive crisis) may cause headache, anxiety, shortness of breath, and nosebleed. DIAGNOSIS  To check if you have hypertension, your health care provider will measure your blood pressure while you are seated, with your arm held at the level of your heart. It should be measured at least twice using the same arm. Certain conditions can cause a difference in blood pressure between your right and left arms. A blood pressure reading that is higher than normal on one occasion does not mean that you need treatment. If one blood pressure reading is high, ask your health care provider about having it checked again. TREATMENT  Treating high blood pressure includes making lifestyle changes and possibly taking medicine. Living a healthy lifestyle can help lower high blood pressure. You may need to change some of your habits. Lifestyle changes may include:  Following the DASH diet. This diet is high in fruits, vegetables, and whole grains. It is low in salt, red meat, and added sugars.  Getting at least 2 hours of brisk physical activity every week.  Losing weight if necessary.  Not smoking.  Limiting alcoholic beverages.  Learning ways to reduce stress. If lifestyle changes are not enough to get your blood pressure under control, your health care provider may prescribe medicine. You may need to take more than one. Work closely with your health care provider to understand the risks and benefits. HOME CARE INSTRUCTIONS  Have your blood pressure rechecked as directed by your health care provider.   Take medicines only as directed by your health care provider. Follow the directions carefully. Blood pressure medicines must be taken as prescribed. The medicine does not work as well when you skip doses. Skipping doses also puts you at risk for problems.   Do not smoke.   Monitor your blood pressure at home as directed by your health care  provider. SEEK MEDICAL CARE IF:   You think you are having a reaction to medicines taken.  You have recurrent headaches or feel dizzy.  You have swelling in your ankles.  You have trouble with your vision. SEEK IMMEDIATE MEDICAL CARE IF:  You develop a severe headache or confusion.  You have unusual weakness, numbness, or feel faint.  You have severe chest or abdominal pain.  You vomit repeatedly.  You have trouble breathing. MAKE SURE YOU:   Understand these instructions.  Will watch your condition.  Will get help right away if you are not doing well or get worse. Document Released: 02/21/2005 Document Revised: 07/08/2013 Document Reviewed: 12/14/2012 [facility] Patient Information 2015 [place], [place]. This information is not intended to replace advice given to you by your health care provider. Make sure you discuss any questions you have with your health care provider.

## 2013-11-01 NOTE — Assessment & Plan Note (Signed)
Will do wet prep, GC/C. Pt declines HIV today  Will check for pap smear with HPV in 1 year

## 2013-11-01 NOTE — Progress Notes (Signed)
   Subjective:    Patient ID: Dawn Peters, female    DOB: 05-Jun-1980, 33 y.o.   MRN: [id number]  HPI Comments: 33 y.o Ms. Dawn Peters PMH MS (in remission), anxiety/depression, HTN, b/l hip replacement  She presents today for annual exam and breast exam 1. She wants STD screen today. She reports fishy odor this am but reports using scented products in the vaginal area.  She is sexually active with one partner her fiance 2. HTN-BP normally controlled but today 125/103 and she took Lisinopril 20 mg qd  3. Needs refill of Camilla 0.35. She has been taking for 7-8 years.     FH: paternal GM with breast cancer dx'ed at 35 y.o. She is now 33 y.o   SH: fiance with 12 kids wants more kids but she does not desire; she is getting separated; 72 y.o son.    Gynecologic Exam The patient's primary symptoms include a genital odor. The patient's pertinent negatives include no vaginal discharge. Primary symptoms comment: fishy odor noted this am. This is a new problem. The current episode started today. She is not pregnant. Pertinent negatives include no dysuria. The vaginal discharge was normal. There has been no bleeding. She has tried nothing for the symptoms. She is sexually active. It is unknown whether or not her partner has an STD. She uses oral contraceptives for contraception.      Review of Systems  Respiratory: Negative for shortness of breath.   Cardiovascular: Negative for chest pain.  Genitourinary: Negative for dysuria, vaginal bleeding and vaginal discharge.       +vaginal odor this am       Objective:   Physical Exam  Nursing note and vitals reviewed. Constitutional: She is oriented to person, place, and time. She appears well-developed and well-nourished. She is cooperative. No distress.  HENT:  Head: Normocephalic and atraumatic.  Mouth/Throat: No oropharyngeal exudate.  Eyes: Conjunctivae are normal. Right eye exhibits no discharge. Left eye exhibits no discharge. No  scleral icterus.  Cardiovascular: Normal rate, regular rhythm, S1 normal, S2 normal and normal heart sounds.   No murmur heard. No lower ext edema   Pulmonary/Chest: Effort normal and breath sounds normal. No respiratory distress. She has no wheezes.  Normal b/l breast exam no grosses masses on exam or in axilla   Abdominal: Soft. Bowel sounds are normal. There is no tenderness.  Genitourinary: Pelvic exam was performed with patient supine. Cervix exhibits discharge. Cervix exhibits no motion tenderness and no friability.  Min white thin discharge on exam  Cervix is posterior and left on exam  Neurological: She is alert and oriented to person, place, and time. Gait normal.  Skin: Skin is warm and dry. No rash noted. She is not diaphoretic.  Psychiatric: She has a normal mood and affect. Her speech is normal and behavior is normal. Judgment and thought content normal. Cognition and memory are normal.          Assessment & Plan:  F/u 1 year for pap smear   I examine patient with the resident and I agree with the note and plan  Woodroe Mode, MD

## 2013-11-01 NOTE — Assessment & Plan Note (Addendum)
Doing well  Will check for STDs (GC/C) and wet prep today  F/u in 1 year for pap smear. Will need mammogram at 38 due to + family history paternal grandmother Rx refill Camilla 0.35 x 1 year supply

## 2013-11-01 NOTE — Assessment & Plan Note (Signed)
Rx refill Dawn Peters 0.35 qd today x 1 year supply

## 2013-11-02 LAB — WET PREP, GENITAL
TRICH WET PREP: NONE SEEN
WBC, Wet Prep HPF POC: NONE SEEN

## 2013-11-03 ENCOUNTER — Other Ambulatory Visit: Payer: Self-pay | Admitting: Family Medicine

## 2013-11-04 ENCOUNTER — Telehealth: Payer: Self-pay

## 2013-11-04 DIAGNOSIS — B9689 Other specified bacterial agents as the cause of diseases classified elsewhere: Secondary | ICD-10-CM

## 2013-11-04 DIAGNOSIS — N760 Acute vaginitis: Principal | ICD-10-CM

## 2013-11-04 LAB — GC/CHLAMYDIA PROBE AMP
CT PROBE, AMP APTIMA: NEGATIVE
GC Probe RNA: NEGATIVE

## 2013-11-04 MED ORDER — METRONIDAZOLE 500 MG PO TABS: 500.0000 mg | ORAL_TABLET | Freq: Two times a day (BID) | ORAL | Status: DC

## 2013-11-04 NOTE — Telephone Encounter (Signed)
Flagyl 500mg  BID X7days e-prescribed per protocol. Called patient and informed her of results. Patient asked about GC.chlamydia-- informed her results are preliminary and have not come back yet but stated we would call her if anything was abnormal. Patient verbalized understanding and gratitude. No questions or concerns.

## 2013-11-04 NOTE — Telephone Encounter (Signed)
Message copied by Geanie Logan on Mon Nov 04, 2013  9:02 AM ------      Message from: Woodroe Mode      Created: Sun Nov 03, 2013 10:57 AM       BV, Rx Flagyl 500 mg BID 7 days ------

## 2013-11-12 ENCOUNTER — Other Ambulatory Visit: Payer: Self-pay | Admitting: Family Medicine

## 2013-11-14 NOTE — Telephone Encounter (Signed)
Called pt to see f/up plan. She stated that she has an appt sch w/PCP practice, [facility], on 11/19/13 and she will have them manage this medication. I advised her that I will send in a 7 day RF to cover her until then. She thanked [place].

## 2013-11-17 ENCOUNTER — Other Ambulatory Visit: Payer: Self-pay | Admitting: Physician Assistant

## 2013-11-19 ENCOUNTER — Encounter: Payer: Self-pay | Admitting: Internal Medicine

## 2013-11-19 ENCOUNTER — Ambulatory Visit: Payer: PRIVATE HEALTH INSURANCE | Attending: Internal Medicine | Admitting: Internal Medicine

## 2013-11-19 VITALS — BP 112/72 | HR 85 | Temp 98.1°F | Resp 16 | Wt 123.0 lb

## 2013-11-19 DIAGNOSIS — I10 Essential (primary) hypertension: Secondary | ICD-10-CM | POA: Diagnosis present

## 2013-11-19 DIAGNOSIS — F3289 Other specified depressive episodes: Secondary | ICD-10-CM | POA: Insufficient documentation

## 2013-11-19 DIAGNOSIS — F411 Generalized anxiety disorder: Secondary | ICD-10-CM | POA: Insufficient documentation

## 2013-11-19 DIAGNOSIS — R21 Rash and other nonspecific skin eruption: Secondary | ICD-10-CM | POA: Diagnosis not present

## 2013-11-19 DIAGNOSIS — G35 Multiple sclerosis: Secondary | ICD-10-CM | POA: Insufficient documentation

## 2013-11-19 DIAGNOSIS — F329 Major depressive disorder, single episode, unspecified: Secondary | ICD-10-CM | POA: Insufficient documentation

## 2013-11-19 DIAGNOSIS — Z23 Encounter for immunization: Secondary | ICD-10-CM

## 2013-11-19 MED ORDER — KETOCONAZOLE 2 % EX CREA: 1.0000 "application " | TOPICAL_CREAM | Freq: Every day | CUTANEOUS | Status: DC

## 2013-11-19 MED ORDER — LORAZEPAM 1 MG PO TABS: 1.0000 mg | ORAL_TABLET | Freq: Two times a day (BID) | ORAL | Status: DC | PRN

## 2013-11-19 MED ORDER — CITALOPRAM HYDROBROMIDE 20 MG PO TABS: ORAL_TABLET | ORAL | Status: DC

## 2013-11-19 NOTE — Progress Notes (Signed)
MRN: [id number] Name: Dawn Peters  Sex: female Age: 33 y.o. DOB: [birth year]  Allergies: Review of patient's allergies indicates no known allergies.  Chief Complaint  Patient presents with  . Follow-up    HPI: Patient is 33 y.o. female who has to of hypertension, anxiety, depression comes today requesting refill on her medication as per patient she was prescribed Celexa 20 mg and was prescribed Ativan which helps her with anxiety attacks, she has never been evaluated by psychiatrist, currently denies any SI or HI, patient to take lisinopril for blood pressure, denies any headache dizziness chest and shortness of breath, she reported to have dry itchy rash on right foot denies any fever chills any discharge.  Past Medical History  Diagnosis Date  . MS (multiple sclerosis)   . Hypertension   . Anxiety   . Depression     Past Surgical History  Procedure Laterality Date  . Bilateral hip arthroscopy    . Joint replacement        Medication List       This list is accurate as of: 11/19/13  3:05 PM.  Always use your most recent med list.               baclofen 20 MG tablet  Commonly known as:  LIORESAL  Take 20 mg by mouth 3 (three) times daily as needed (for ms).     Cetirizine HCl 10 MG Caps  Take 10 mg q d prn allergies     citalopram 20 MG tablet  Commonly known as:  CELEXA  Take 1 tablet (20 mg total) by mouth daily. NO MORE REFILLS WITHOUT OFFICE VISIT - 2ND NOTICE     citalopram 20 MG tablet  Commonly known as:  CELEXA  TAKE 1 TABLET BY MOUTH EVERY DAY     gabapentin 800 MG tablet  Commonly known as:  NEURONTIN  Take 800 mg by mouth 3 (three) times daily.     ketoconazole 2 % cream  Commonly known as:  NIZORAL  Apply 1 application topically daily.     lisinopril 20 MG tablet  Commonly known as:  PRINIVIL,ZESTRIL  Take 20 mg by mouth daily.     LORazepam 1 MG tablet  Commonly known as:  ATIVAN  Take 1 tablet (1 mg total) by mouth 2 (two)  times daily as needed for anxiety.     metroNIDAZOLE 500 MG tablet  Commonly known as:  FLAGYL  Take 1 tablet (500 mg total) by mouth 3 (three) times daily.     metroNIDAZOLE 500 MG tablet  Commonly known as:  FLAGYL  Take 1 tablet (500 mg total) by mouth 2 (two) times daily.     multivitamin with minerals Tabs tablet  Take 1 tablet by mouth daily.     norethindrone 0.35 MG tablet  Commonly known as:  CAMILA  Take 1 tablet (0.35 mg total) by mouth daily.     omeprazole 20 MG capsule  Commonly known as:  PRILOSEC  Take 20 mg by mouth daily as needed (heart burn).     OVER THE COUNTER MEDICATION  Take 1 capsule by mouth 2 (two) times daily. Bee Capsule     oxybutynin 5 MG tablet  Commonly known as:  DITROPAN  Take 5 mg by mouth at bedtime.     OxyCODONE HCl (Abuse Deter) 5 MG Taba  Take 5 mg by mouth 3 (three) times daily as needed (pain).     TECFIDERA 240  MG Cpdr  Generic drug:  Dimethyl Fumarate  Take 240 mg by mouth 2 (two) times daily.     tiZANidine 4 MG tablet  Commonly known as:  ZANAFLEX  Take 4 mg by mouth at bedtime.        Meds ordered this encounter  Medications  . LORazepam (ATIVAN) 1 MG tablet    Sig: Take 1 tablet (1 mg total) by mouth 2 (two) times daily as needed for anxiety.    Dispense:  20 tablet    Refill:  0  . citalopram (CELEXA) 20 MG tablet    Sig: TAKE 1 TABLET BY MOUTH EVERY DAY    Dispense:  30 tablet    Refill:  3  . ketoconazole (NIZORAL) 2 % cream    Sig: Apply 1 application topically daily.    Dispense:  15 g    Refill:  0    Immunization History  Administered Date(s) Administered  . PPD Test 10/01/2012    Family History  Problem Relation Age of Onset  . Breast cancer      paternal grandmother dx age 67  . Colon cancer      paternal grandmother    History  Substance Use Topics  . Smoking status: Never Smoker   . Smokeless tobacco: Never Used  . Alcohol Use: No    Review of Systems   As noted in  HPI  Filed Vitals:   11/19/13 1442  BP: 112/72  Pulse: 85  Temp: 98.1 F (36.7 C)  Resp: 16    Physical Exam  Physical Exam  Constitutional: No distress.  Eyes: EOM are normal. Pupils are equal, round, and reactive to light.  Cardiovascular: Normal rate and regular rhythm.   Pulmonary/Chest: Breath sounds normal. No respiratory distress. She has no wheezes. She has no rales.  Musculoskeletal: She exhibits no edema.  Right foot dry skin rash but the toes and plantar surface, no tenderness    CBC    Component Value Date/Time   WBC 4.8 08/04/2013 0950   RBC 4.36 08/04/2013 0950   HGB 14.8 08/04/2013 0950   HCT 41.3 08/04/2013 0950   PLT 210 08/04/2013 0950   MCV 94.7 08/04/2013 0950   LYMPHSABS 0.5* 08/04/2013 0950   MONOABS 0.3 08/04/2013 0950   EOSABS 0.0 08/04/2013 0950   BASOSABS 0.0 08/04/2013 0950    CMP     Component Value Date/Time   NA 141 08/04/2013 0950   K 3.6* 08/04/2013 0950   CL 102 08/04/2013 0950   CO2 28 08/04/2013 0950   GLUCOSE 87 08/04/2013 0950   BUN 16 08/04/2013 0950   CREATININE 0.83 08/04/2013 0950   CREATININE 0.81 05/31/2013 1020   CALCIUM 9.4 08/04/2013 0950   PROT 7.5 08/04/2013 0950   ALBUMIN 4.4 08/04/2013 0950   AST 19 08/04/2013 0950   ALT 10 08/04/2013 0950   ALKPHOS 54 08/04/2013 0950   BILITOT 0.7 08/04/2013 0950   GFRNONAA >90 08/04/2013 0950   GFRNONAA >89 05/31/2013 1020   GFRAA >90 08/04/2013 0950   GFRAA >89 05/31/2013 1020    No results found for this basename: chol, tri, ldl    No components found with this basename: hga1c    Lab Results  Component Value Date/Time   AST 19 08/04/2013  9:50 AM    Assessment and Plan  Essential hypertension, benign - Plan: Repeat COMPLETE METABOLIC PANEL WITH GFR, also check Vit D  25 hydroxy (rtn osteoporosis monitoring)  Anxiety state, unspecified -  Plan: I have given patient refill on the medication also advised to see a psychiatrist LORazepam (ATIVAN) 1 MG tablet, citalopram (CELEXA) 20 MG  tablet, Ambulatory referral to Psychiatry  Rash and nonspecific skin eruption - Plan: ketoconazole (NIZORAL) 2 % cream   Return in about 3 months (around 02/18/2014) for hypertension, anxiety.  [name], MD

## 2013-11-19 NOTE — Progress Notes (Signed)
Patient states suffers from depression and is requesting  A prescription for ativan

## 2013-11-20 ENCOUNTER — Telehealth: Payer: Self-pay | Admitting: *Deleted

## 2013-11-20 LAB — COMPLETE METABOLIC PANEL WITH GFR
ALT: 10 U/L (ref 0–35)
AST: 18 U/L (ref 0–37)
Albumin: 4.7 g/dL (ref 3.5–5.2)
Alkaline Phosphatase: 48 U/L (ref 39–117)
BUN: 18 mg/dL (ref 6–23)
CALCIUM: 10.2 mg/dL (ref 8.4–10.5)
CO2: 32 meq/L (ref 19–32)
CREATININE: 0.9 mg/dL (ref 0.50–1.10)
Chloride: 101 mEq/L (ref 96–112)
GFR, EST NON AFRICAN AMERICAN: 83 mL/min
Glucose, Bld: 69 mg/dL — ABNORMAL LOW (ref 70–99)
POTASSIUM: 4.8 meq/L (ref 3.5–5.3)
Sodium: 144 mEq/L (ref 135–145)
Total Bilirubin: 0.6 mg/dL (ref 0.2–1.2)
Total Protein: 7.7 g/dL (ref 6.0–8.3)

## 2013-11-20 LAB — VITAMIN D 25 HYDROXY (VIT D DEFICIENCY, FRACTURES): VIT D 25 HYDROXY: 23 ng/mL — AB (ref 30–89)

## 2013-11-20 MED ORDER — VITAMIN D (ERGOCALCIFEROL) 1.25 MG (50000 UNIT) PO CAPS: 50000.0000 [IU] | ORAL_CAPSULE | ORAL | Status: DC

## 2013-11-20 NOTE — Telephone Encounter (Signed)
Pt aware of lab results. rx ergocalciferol  50,000 unit e-scribe to our pharmacy

## 2013-11-20 NOTE — Telephone Encounter (Signed)
Message copied by Betti Cruz on Wed Nov 20, 2013  4:50 PM ------      Message from: [name]      Created: Wed Nov 20, 2013  9:38 AM       Blood work reviewed, noticed low vitamin D, call patient advise to start ergocalciferol 50,000 units once a week for the duration of  12 weeks.       ------

## 2013-11-21 ENCOUNTER — Ambulatory Visit: Payer: PRIVATE HEALTH INSURANCE | Attending: Orthopedic Surgery | Admitting: Physical Therapy

## 2013-11-21 DIAGNOSIS — M25559 Pain in unspecified hip: Secondary | ICD-10-CM | POA: Diagnosis not present

## 2013-11-21 DIAGNOSIS — R262 Difficulty in walking, not elsewhere classified: Secondary | ICD-10-CM | POA: Insufficient documentation

## 2013-11-21 DIAGNOSIS — Z5189 Encounter for other specified aftercare: Secondary | ICD-10-CM | POA: Insufficient documentation

## 2013-11-21 DIAGNOSIS — M6281 Muscle weakness (generalized): Secondary | ICD-10-CM | POA: Insufficient documentation

## 2013-11-22 ENCOUNTER — Telehealth: Payer: Self-pay

## 2013-11-22 DIAGNOSIS — F411 Generalized anxiety disorder: Secondary | ICD-10-CM

## 2013-11-22 MED ORDER — LORAZEPAM 1 MG PO TABS: 1.0000 mg | ORAL_TABLET | Freq: Two times a day (BID) | ORAL | Status: DC | PRN

## 2013-11-22 NOTE — Telephone Encounter (Signed)
Patient is aware of her lab results Patient stated never received her prescription for ativan Patient will pick up prescription today

## 2013-11-22 NOTE — Telephone Encounter (Signed)
Message copied by Dorothe Pea on Fri Nov 22, 2013 10:33 AM ------      Message from: [name]      Created: Wed Nov 20, 2013  9:38 AM       Blood work reviewed, noticed low vitamin D, call patient advise to start ergocalciferol 50,000 units once a week for the duration of  12 weeks.       ------

## 2013-11-28 ENCOUNTER — Ambulatory Visit: Payer: PRIVATE HEALTH INSURANCE | Admitting: Rehabilitation

## 2013-12-02 ENCOUNTER — Encounter: Payer: PRIVATE HEALTH INSURANCE | Admitting: Physical Therapy

## 2013-12-04 ENCOUNTER — Ambulatory Visit: Payer: PRIVATE HEALTH INSURANCE | Admitting: Physical Therapy

## 2013-12-04 DIAGNOSIS — Z5189 Encounter for other specified aftercare: Secondary | ICD-10-CM | POA: Diagnosis not present

## 2013-12-09 ENCOUNTER — Ambulatory Visit: Payer: PRIVATE HEALTH INSURANCE | Attending: Orthopedic Surgery

## 2013-12-11 ENCOUNTER — Encounter: Payer: PRIVATE HEALTH INSURANCE | Admitting: Physical Therapy

## 2013-12-17 ENCOUNTER — Encounter: Payer: PRIVATE HEALTH INSURANCE | Admitting: Rehabilitation

## 2013-12-31 NOTE — Telephone Encounter (Signed)
Has been taken care of by [facility]

## 2014-01-06 ENCOUNTER — Encounter: Payer: Self-pay | Admitting: Internal Medicine

## 2014-03-10 ENCOUNTER — Encounter: Payer: Self-pay | Admitting: Internal Medicine

## 2014-03-10 ENCOUNTER — Ambulatory Visit: Payer: Medicare Other | Attending: Internal Medicine | Admitting: Internal Medicine

## 2014-03-10 VITALS — BP 147/88 | HR 77 | Temp 98.0°F | Resp 16 | Wt 131.2 lb

## 2014-03-10 DIAGNOSIS — F329 Major depressive disorder, single episode, unspecified: Secondary | ICD-10-CM | POA: Insufficient documentation

## 2014-03-10 DIAGNOSIS — I10 Essential (primary) hypertension: Secondary | ICD-10-CM | POA: Insufficient documentation

## 2014-03-10 DIAGNOSIS — G35 Multiple sclerosis: Secondary | ICD-10-CM | POA: Diagnosis not present

## 2014-03-10 DIAGNOSIS — F419 Anxiety disorder, unspecified: Secondary | ICD-10-CM | POA: Insufficient documentation

## 2014-03-10 DIAGNOSIS — M25562 Pain in left knee: Secondary | ICD-10-CM

## 2014-03-10 DIAGNOSIS — E559 Vitamin D deficiency, unspecified: Secondary | ICD-10-CM

## 2014-03-10 DIAGNOSIS — Z79899 Other long term (current) drug therapy: Secondary | ICD-10-CM | POA: Diagnosis not present

## 2014-03-10 DIAGNOSIS — F411 Generalized anxiety disorder: Secondary | ICD-10-CM

## 2014-03-10 MED ORDER — LORAZEPAM 1 MG PO TABS: 1.0000 mg | ORAL_TABLET | Freq: Two times a day (BID) | ORAL | Status: DC | PRN

## 2014-03-10 MED ORDER — ACETAMINOPHEN-CODEINE #3 300-30 MG PO TABS: 1.0000 | ORAL_TABLET | Freq: Three times a day (TID) | ORAL | Status: DC | PRN

## 2014-03-10 MED ORDER — VITAMIN D (ERGOCALCIFEROL) 1.25 MG (50000 UNIT) PO CAPS: 50000.0000 [IU] | ORAL_CAPSULE | ORAL | Status: DC

## 2014-03-10 NOTE — Progress Notes (Signed)
MRN: [id number] Name: Dawn Peters  Sex: female Age: 34 y.o. DOB: [birth year]  Allergies: Review of patient's allergies indicates no known allergies.  Chief Complaint  Patient presents with  . Knee Pain    HPI: Patient is 34 y.o. female who history of hypertension, anxiety, chronic left knee pain comes today requesting prescription for pain medication, as per patient she had seen orthopedic Dr. In the past and was prescribed narcotic medication, I have advised patient to have a followup her orthopedics, , she also history of anxiety and requesting refill on the medication lorazepam which she takes when necessary currently also taking Celexa.patient had a blood work done in the past noticed vitamin D deficiency as per patient she needs that prescription.  Past Medical History  Diagnosis Date  . MS (multiple sclerosis)   . Hypertension   . Anxiety   . Depression     Past Surgical History  Procedure Laterality Date  . Bilateral hip arthroscopy    . Joint replacement        Medication List       This list is accurate as of: 03/10/14  5:55 PM.  Always use your most recent med list.               acetaminophen-codeine 300-30 MG per tablet  Commonly known as:  TYLENOL #3  Take 1 tablet by mouth every 8 (eight) hours as needed for moderate pain.     baclofen 20 MG tablet  Commonly known as:  LIORESAL  Take 20 mg by mouth 3 (three) times daily as needed (for ms).     Cetirizine HCl 10 MG Caps  Take 10 mg q d prn allergies     citalopram 20 MG tablet  Commonly known as:  CELEXA  Take 1 tablet (20 mg total) by mouth daily. NO MORE REFILLS WITHOUT OFFICE VISIT - 2ND NOTICE     citalopram 20 MG tablet  Commonly known as:  CELEXA  TAKE 1 TABLET BY MOUTH EVERY DAY     gabapentin 800 MG tablet  Commonly known as:  NEURONTIN  Take 800 mg by mouth 3 (three) times daily.     ketoconazole 2 % cream  Commonly known as:  NIZORAL  Apply 1 application topically  daily.     lisinopril 20 MG tablet  Commonly known as:  PRINIVIL,ZESTRIL  Take 20 mg by mouth daily.     LORazepam 1 MG tablet  Commonly known as:  ATIVAN  Take 1 tablet (1 mg total) by mouth 2 (two) times daily as needed for anxiety.     metroNIDAZOLE 500 MG tablet  Commonly known as:  FLAGYL  Take 1 tablet (500 mg total) by mouth 3 (three) times daily.     metroNIDAZOLE 500 MG tablet  Commonly known as:  FLAGYL  Take 1 tablet (500 mg total) by mouth 2 (two) times daily.     multivitamin with minerals Tabs tablet  Take 1 tablet by mouth daily.     norethindrone 0.35 MG tablet  Commonly known as:  CAMILA  Take 1 tablet (0.35 mg total) by mouth daily.     omeprazole 20 MG capsule  Commonly known as:  PRILOSEC  Take 20 mg by mouth daily as needed (heart burn).     OVER THE COUNTER MEDICATION  Take 1 capsule by mouth 2 (two) times daily. Bee Capsule     oxybutynin 5 MG tablet  Commonly known as:  DITROPAN  Take 5 mg by mouth at bedtime.     OxyCODONE HCl (Abuse Deter) 5 MG Taba  Take 5 mg by mouth 3 (three) times daily as needed (pain).     TECFIDERA 240 MG Cpdr  Generic drug:  Dimethyl Fumarate  Take 240 mg by mouth 2 (two) times daily.     tiZANidine 4 MG tablet  Commonly known as:  ZANAFLEX  Take 4 mg by mouth at bedtime.     Vitamin D (Ergocalciferol) 50000 UNITS Caps capsule  Commonly known as:  DRISDOL  Take 1 capsule (50,000 Units total) by mouth every 7 (seven) days.        Meds ordered this encounter  Medications  . Vitamin D, Ergocalciferol, (DRISDOL) 50000 UNITS CAPS capsule    Sig: Take 1 capsule (50,000 Units total) by mouth every 7 (seven) days.    Dispense:  12 capsule    Refill:  0  . LORazepam (ATIVAN) 1 MG tablet    Sig: Take 1 tablet (1 mg total) by mouth 2 (two) times daily as needed for anxiety.    Dispense:  20 tablet    Refill:  0  . acetaminophen-codeine (TYLENOL #3) 300-30 MG per tablet    Sig: Take 1 tablet by mouth every 8  (eight) hours as needed for moderate pain.    Dispense:  30 tablet    Refill:  0    Immunization History  Administered Date(s) Administered  . Influenza,inj,Quad PF,36+ Mos 11/19/2013  . PPD Test 10/01/2012    Family History  Problem Relation Age of Onset  . Breast cancer      paternal grandmother dx age 77  . Colon cancer      paternal grandmother    History  Substance Use Topics  . Smoking status: Never Smoker   . Smokeless tobacco: Never Used  . Alcohol Use: No    Review of Systems   As noted in HPI  Filed Vitals:   03/10/14 1725  BP: 147/88  Pulse: 77  Temp: 98 F (36.7 C)  Resp: 16    Physical Exam  Physical Exam  Constitutional: No distress.  Eyes: EOM are normal. Pupils are equal, round, and reactive to light.  Cardiovascular: Normal rate and regular rhythm.   Pulmonary/Chest: Breath sounds normal. No respiratory distress. She has no wheezes. She has no rales.  Musculoskeletal:  Left knee minimal  swelling compared to right, no erythema minimal tenderness no warmth to touch, no signs of infection, minimal pain with full extension    CBC    Component Value Date/Time   WBC 4.8 08/04/2013 0950   RBC 4.36 08/04/2013 0950   HGB 14.8 08/04/2013 0950   HCT 41.3 08/04/2013 0950   PLT 210 08/04/2013 0950   MCV 94.7 08/04/2013 0950   LYMPHSABS 0.5* 08/04/2013 0950   MONOABS 0.3 08/04/2013 0950   EOSABS 0.0 08/04/2013 0950   BASOSABS 0.0 08/04/2013 0950    CMP     Component Value Date/Time   NA 144 11/19/2013 1501   K 4.8 11/19/2013 1501   CL 101 11/19/2013 1501   CO2 32 11/19/2013 1501   GLUCOSE 69* 11/19/2013 1501   BUN 18 11/19/2013 1501   CREATININE 0.90 11/19/2013 1501   CREATININE 0.83 08/04/2013 0950   CALCIUM 10.2 11/19/2013 1501   PROT 7.7 11/19/2013 1501   ALBUMIN 4.7 11/19/2013 1501   AST 18 11/19/2013 1501   ALT 10 11/19/2013 1501   ALKPHOS 48 11/19/2013 1501  BILITOT 0.6 11/19/2013 1501   GFRNONAA 83 11/19/2013 1501    GFRNONAA >90 08/04/2013 0950   GFRAA >89 11/19/2013 1501   GFRAA >90 08/04/2013 0950    No results found for: CHOL  No components found for: HGA1C  Lab Results  Component Value Date/Time   AST 18 11/19/2013 03:01 PM    Assessment and Plan  Anxiety state - Plan: patient is currently on Celexa, medication refill done for LORazepam (ATIVAN) 1 MG tabletuse when necessary  Left knee pain - Plan: acetaminophen-codeine (TYLENOL #3) 300-30 MG per tablet, patient is going to make a followup appointment with her orthopedics  Vitamin D deficiency - Plan: Vitamin D, Ergocalciferol, (DRISDOL) 50000 UNITS CAPS capsule   Health Maintenance upotodate with flu shot

## 2014-03-10 NOTE — Progress Notes (Signed)
Patient here for follow up Patient states she does have tendonitis to her left knee And requesting medications to help alleviate some of her discomfort

## 2014-04-01 ENCOUNTER — Other Ambulatory Visit: Payer: Self-pay | Admitting: Internal Medicine

## 2014-04-19 ENCOUNTER — Other Ambulatory Visit: Payer: Self-pay | Admitting: Internal Medicine

## 2014-05-20 ENCOUNTER — Ambulatory Visit: Payer: Medicare Other | Attending: Internal Medicine | Admitting: Internal Medicine

## 2014-05-20 ENCOUNTER — Encounter: Payer: Self-pay | Admitting: Internal Medicine

## 2014-05-20 VITALS — BP 121/82 | HR 98 | Temp 98.0°F | Resp 16 | Wt 130.6 lb

## 2014-05-20 DIAGNOSIS — R21 Rash and other nonspecific skin eruption: Secondary | ICD-10-CM | POA: Diagnosis not present

## 2014-05-20 DIAGNOSIS — M79641 Pain in right hand: Secondary | ICD-10-CM | POA: Insufficient documentation

## 2014-05-20 DIAGNOSIS — F411 Generalized anxiety disorder: Secondary | ICD-10-CM | POA: Diagnosis not present

## 2014-05-20 DIAGNOSIS — I10 Essential (primary) hypertension: Secondary | ICD-10-CM | POA: Diagnosis present

## 2014-05-20 DIAGNOSIS — M25562 Pain in left knee: Secondary | ICD-10-CM | POA: Diagnosis not present

## 2014-05-20 LAB — COMPLETE METABOLIC PANEL WITH GFR
ALK PHOS: 48 U/L (ref 39–117)
ALT: 8 U/L (ref 0–35)
AST: 16 U/L (ref 0–37)
Albumin: 4.1 g/dL (ref 3.5–5.2)
BILIRUBIN TOTAL: 0.7 mg/dL (ref 0.2–1.2)
BUN: 15 mg/dL (ref 6–23)
CO2: 30 mEq/L (ref 19–32)
Calcium: 9.6 mg/dL (ref 8.4–10.5)
Chloride: 100 mEq/L (ref 96–112)
Creat: 0.74 mg/dL (ref 0.50–1.10)
GFR, Est African American: >89 mL/min
GFR, Est Non African American: >89 mL/min
Glucose, Bld: 68 mg/dL — ABNORMAL LOW (ref 70–99)
Potassium: 3.7 mEq/L (ref 3.5–5.3)
SODIUM: 139 meq/L (ref 135–145)
TOTAL PROTEIN: 6.9 g/dL (ref 6.0–8.3)

## 2014-05-20 MED ORDER — HYDROCORTISONE 2.5 % EX CREA: TOPICAL_CREAM | Freq: Two times a day (BID) | CUTANEOUS | Status: DC

## 2014-05-20 NOTE — Progress Notes (Signed)
Patient complains of pain to left knee Patient states sometimes when she wakes up her knee gives out Patient also fell out oh her car and injured her right wrist

## 2014-05-20 NOTE — Progress Notes (Signed)
MRN: [id number] Name: Dawn Peters  Sex: female Age: 34 y.o. DOB: 1980/06/21  Allergies: Review of patient's allergies indicates no known allergies.  Chief Complaint  Patient presents with  . Follow-up    HPI: Patient is 34 y.o. female who has to of hypertension, anxiety, comes today for followup, as per patient she is compliant in taking her blood pressure medication, recently reported to have fallen down when she tripped and her landed on her right hand since then it has been hurting, she also has history of chronic left knee pain and had had been following up with orthopedic specialist.  Past Medical History  Diagnosis Date  . MS (multiple sclerosis)   . Hypertension   . Anxiety   . Depression     Past Surgical History  Procedure Laterality Date  . Bilateral hip arthroscopy    . Joint replacement        Medication List       This list is accurate as of: 05/20/14 12:51 PM.  Always use your most recent med list.               acetaminophen-codeine 300-30 MG per tablet  Commonly known as:  TYLENOL #3  Take 1 tablet by mouth every 8 (eight) hours as needed for moderate pain.     baclofen 20 MG tablet  Commonly known as:  LIORESAL  Take 20 mg by mouth 3 (three) times daily as needed (for ms).     Cetirizine HCl 10 MG Caps  Take 10 mg q d prn allergies     citalopram 20 MG tablet  Commonly known as:  CELEXA  Take 1 tablet (20 mg total) by mouth daily. NO MORE REFILLS WITHOUT OFFICE VISIT - 2ND NOTICE     citalopram 20 MG tablet  Commonly known as:  CELEXA  TAKE 1 TABLET BY MOUTH EVERY DAY     gabapentin 800 MG tablet  Commonly known as:  NEURONTIN  Take 800 mg by mouth 3 (three) times daily.     hydrocortisone 2.5 % cream  Apply topically 2 (two) times daily.     ketoconazole 2 % cream  Commonly known as:  NIZORAL  Apply 1 application topically daily.     lisinopril 20 MG tablet  Commonly known as:  PRINIVIL,ZESTRIL  Take 20 mg by mouth  daily.     LORazepam 1 MG tablet  Commonly known as:  ATIVAN  Take 1 tablet (1 mg total) by mouth 2 (two) times daily as needed for anxiety.     metroNIDAZOLE 500 MG tablet  Commonly known as:  FLAGYL  Take 1 tablet (500 mg total) by mouth 3 (three) times daily.     metroNIDAZOLE 500 MG tablet  Commonly known as:  FLAGYL  Take 1 tablet (500 mg total) by mouth 2 (two) times daily.     multivitamin with minerals Tabs tablet  Take 1 tablet by mouth daily.     norethindrone 0.35 MG tablet  Commonly known as:  CAMILA  Take 1 tablet (0.35 mg total) by mouth daily.     omeprazole 20 MG capsule  Commonly known as:  PRILOSEC  Take 20 mg by mouth daily as needed (heart burn).     OVER THE COUNTER MEDICATION  Take 1 capsule by mouth 2 (two) times daily. Bee Capsule     oxybutynin 5 MG tablet  Commonly known as:  DITROPAN  Take 5 mg by mouth at bedtime.  OxyCODONE HCl (Abuse Deter) 5 MG Taba  Take 5 mg by mouth 3 (three) times daily as needed (pain).     TECFIDERA 240 MG Cpdr  Generic drug:  Dimethyl Fumarate  Take 240 mg by mouth 2 (two) times daily.     tiZANidine 4 MG tablet  Commonly known as:  ZANAFLEX  Take 4 mg by mouth at bedtime.     Vitamin D (Ergocalciferol) 50000 UNITS Caps capsule  Commonly known as:  DRISDOL  Take 1 capsule (50,000 Units total) by mouth every 7 (seven) days.        Meds ordered this encounter  Medications  . hydrocortisone 2.5 % cream    Sig: Apply topically 2 (two) times daily.    Dispense:  30 g    Refill:  0    Immunization History  Administered Date(s) Administered  . Influenza,inj,Quad PF,36+ Mos 11/19/2013  . PPD Test 10/01/2012    Family History  Problem Relation Age of Onset  . Breast cancer      paternal grandmother dx age 3  . Colon cancer      paternal grandmother    History  Substance Use Topics  . Smoking status: Never Smoker   . Smokeless tobacco: Never Used  . Alcohol Use: No    Review of  Systems   As noted in HPI  Filed Vitals:   05/20/14 1111  BP: 121/82  Pulse: 98  Temp: 98 F (36.7 C)  Resp: 16    Physical Exam  Physical Exam  Constitutional: No distress.  Eyes: EOM are normal. Pupils are equal, round, and reactive to light.  Cardiovascular: Normal rate and regular rhythm.   Pulmonary/Chest: Breath sounds normal. No respiratory distress. She has no wheezes. She has no rales.  Musculoskeletal:  Right hand no erythema or smoking minimal tenderness at the wrist good range of motion, 2+ radial pulse    CBC    Component Value Date/Time   WBC 4.8 08/04/2013 0950   RBC 4.36 08/04/2013 0950   HGB 14.8 08/04/2013 0950   HCT 41.3 08/04/2013 0950   PLT 210 08/04/2013 0950   MCV 94.7 08/04/2013 0950   LYMPHSABS 0.5* 08/04/2013 0950   MONOABS 0.3 08/04/2013 0950   EOSABS 0.0 08/04/2013 0950   BASOSABS 0.0 08/04/2013 0950    CMP     Component Value Date/Time   NA 144 11/19/2013 1501   K 4.8 11/19/2013 1501   CL 101 11/19/2013 1501   CO2 32 11/19/2013 1501   GLUCOSE 69* 11/19/2013 1501   BUN 18 11/19/2013 1501   CREATININE 0.90 11/19/2013 1501   CREATININE 0.83 08/04/2013 0950   CALCIUM 10.2 11/19/2013 1501   PROT 7.7 11/19/2013 1501   ALBUMIN 4.7 11/19/2013 1501   AST 18 11/19/2013 1501   ALT 10 11/19/2013 1501   ALKPHOS 48 11/19/2013 1501   BILITOT 0.6 11/19/2013 1501   GFRNONAA 83 11/19/2013 1501   GFRNONAA >90 08/04/2013 0950   GFRAA >89 11/19/2013 1501   GFRAA >90 08/04/2013 0950    No results found for: CHOL  No components found for: HGA1C  Lab Results  Component Value Date/Time   AST 18 11/19/2013 03:01 PM    Assessment and Plan  Essential hypertension, benign - Plan: blood pressure is well controlled, continue current meds, repeat blood chemistryCOMPLETE METABOLIC PANEL WITH GFR  Left knee pain Patient follow up with orthopedics.  Anxiety state - Plan: currently patient is on Celexa, Ambulatory referral to  Psychiatry  Right hand pain - Plan: DG Hand Complete Right  Rash and nonspecific skin eruption - Plan: hydrocortisone 2.5 % cream   Health Maintenance  -Vaccinations:  Up-to-date with flu shot.  Return in about 3 months (around 08/20/2014).   This note has been created with Surveyor, quantity. Any transcriptional errors are unintentional.    [name], MD

## 2014-05-21 ENCOUNTER — Telehealth: Payer: Self-pay

## 2014-05-21 NOTE — Telephone Encounter (Signed)
-----   Message from [name], MD sent at 05/21/2014  9:48 AM EDT ----- Blood work reviewed, call and let the patient know that her blood sugar was borderline low otherwise rest of blood chemistry is normal.

## 2014-05-21 NOTE — Telephone Encounter (Signed)
Patient is aware of her lab results 

## 2014-05-29 ENCOUNTER — Telehealth: Payer: Self-pay | Admitting: Emergency Medicine

## 2014-06-05 ENCOUNTER — Encounter (HOSPITAL_COMMUNITY): Payer: Self-pay | Admitting: *Deleted

## 2014-06-05 ENCOUNTER — Emergency Department (INDEPENDENT_AMBULATORY_CARE_PROVIDER_SITE_OTHER)
Admission: EM | Admit: 2014-06-05 | Discharge: 2014-06-05 | Disposition: A | Discharge status: Home / Self Care | Payer: Medicare Other | Source: Home / Self Care | Attending: Emergency Medicine | Admitting: Emergency Medicine

## 2014-06-05 DIAGNOSIS — A059 Bacterial foodborne intoxication, unspecified: Secondary | ICD-10-CM

## 2014-06-05 DIAGNOSIS — K648 Other hemorrhoids: Secondary | ICD-10-CM

## 2014-06-05 DIAGNOSIS — K644 Residual hemorrhoidal skin tags: Secondary | ICD-10-CM

## 2014-06-05 MED ORDER — TRAMADOL HCL 50 MG PO TABS: 50.0000 mg | ORAL_TABLET | Freq: Four times a day (QID) | ORAL | Status: DC | PRN

## 2014-06-05 NOTE — ED Notes (Signed)
Went a cruise and ate prime rib, salad, chicken pot pie, corn and peach cobbler for dinner. She had bacon and grits for breakfast.  Had N and V onset last night @ 0200.  Vomited x 7 and none today.  Had d x 4 last night and 2 today with dark red blood.  Passed less than a teaspoon of blood in the toilet with stool.  C/o abdominal cramping all day.

## 2014-06-05 NOTE — Discharge Instructions (Signed)
You have food poisoning. Your symptoms should resolve in the next 24 hours. The bleeding is from a small hemorrhoid. This will resolve as the diarrhea resolves. Use tramadol every 6 hours as needed for pain.  If you see a large amount of blood in the toilet or your symptoms do not resolve in the next 1-2 days, please come back.

## 2014-06-05 NOTE — ED Provider Notes (Signed)
CSN: [id number]     Arrival date & time 06/05/14  1958 History   First MD Initiated Contact with Patient 06/05/14 2025     Chief Complaint  Patient presents with  . Rectal Bleeding  . Abdominal Cramping   (Consider location/radiation/quality/duration/timing/severity/associated sxs/prior Treatment) HPI  She is a 34 year old woman here for evaluation of abdominal pain. She states she was on a mini-Cruise and ate a variety of foods. Both she and her fianc got sick shortly afterwards. She reports vomiting yesterday, this has resolved. She developed abdominal cramping and diarrhea yesterday afternoon. This has persisted into today. She states the diarrhea is getting a little bit better, but she has seen a small amount of blood in her stool with the last 2 bowel movements. This was especially concerning to her as her grandmother had colon cancer and rectal bleeding was the first time. She denies any current nausea or vomiting. No fevers. She does have some chronic constipation.  Past Medical History  Diagnosis Date  . MS (multiple sclerosis)   . Hypertension   . Anxiety   . Depression    Past Surgical History  Procedure Laterality Date  . Bilateral hip arthroscopy Left 07/2013  . Joint replacement Bilateral      R 2004 and L 2005   Family History  Problem Relation Age of Onset  . Breast cancer      paternal grandmother dx age 73  . Colon cancer      paternal grandmother   History  Substance Use Topics  . Smoking status: Never Smoker   . Smokeless tobacco: Never Used  . Alcohol Use: No   OB History    Gravida Para Term Preterm AB TAB SAB Ectopic Multiple Living   1 1 1       1      Review of Systems  Constitutional: Negative for fever and chills.  Gastrointestinal: Positive for nausea, vomiting, abdominal pain, diarrhea and blood in stool.    Allergies  Review of patient's allergies indicates no known allergies.  Home Medications   Prior to Admission medications    Medication Sig Start Date End Date Taking? Authorizing Provider  baclofen (LIORESAL) 20 MG tablet Take 20 mg by mouth 3 (three) times daily as needed (for ms).    Yes Historical Provider, MD  citalopram (CELEXA) 20 MG tablet Take 1 tablet (20 mg total) by mouth daily. NO MORE REFILLS WITHOUT OFFICE VISIT - 2ND NOTICE 11/05/13  Yes Mancel Bale, PA-C  Dimethyl Fumarate (TECFIDERA) 240 MG CPDR Take 240 mg by mouth 2 (two) times daily.   Yes Historical Provider, MD  gabapentin (NEURONTIN) 800 MG tablet Take 800 mg by mouth 3 (three) times daily.   Yes Historical Provider, MD  hydrocortisone 2.5 % cream Apply topically 2 (two) times daily. 05/20/14  Yes Deepak Advani, MD  lisinopril (PRINIVIL,ZESTRIL) 20 MG tablet Take 20 mg by mouth daily.   Yes Historical Provider, MD  LORazepam (ATIVAN) 1 MG tablet Take 1 tablet (1 mg total) by mouth 2 (two) times daily as needed for anxiety. 03/10/14  Yes [name], MD  Multiple Vitamin (MULTIVITAMIN WITH MINERALS) TABS Take 1 tablet by mouth daily.   Yes Historical Provider, MD  norethindrone (CAMILA) 0.35 MG tablet Take 1 tablet (0.35 mg total) by mouth daily. 11/01/13 11/01/14 Yes Cresenciano Genre, MD  oxybutynin (DITROPAN) 5 MG tablet Take 5 mg by mouth at bedtime.  07/10/13  Yes Historical Provider, MD  tiZANidine (ZANAFLEX) 4 MG  tablet Take 4 mg by mouth at bedtime.  07/07/13  Yes Historical Provider, MD  omeprazole (PRILOSEC) 20 MG capsule Take 20 mg by mouth daily as needed (heart burn).  07/10/13   Historical Provider, MD  OVER THE COUNTER MEDICATION Take 1 capsule by mouth 2 (two) times daily. Bee Capsule    Historical Provider, MD  traMADol (ULTRAM) 50 MG tablet Take 1 tablet (50 mg total) by mouth every 6 (six) hours as needed. 06/05/14   Melony Overly, MD  Vitamin D, Ergocalciferol, (DRISDOL) 50000 UNITS CAPS capsule Take 1 capsule (50,000 Units total) by mouth every 7 (seven) days. 03/10/14   [name], MD   BP 132/87 mmHg  Pulse 84  Temp(Src) 99.6 F (37.6  C) (Oral)  Resp 16  SpO2 100%  LMP 05/05/2014 Physical Exam  Constitutional: She appears well-developed and well-nourished. No distress.  Cardiovascular: Normal rate.   Pulmonary/Chest: Effort normal.  Abdominal: Soft. Bowel sounds are normal. She exhibits no distension. There is no tenderness. There is no rebound and no guarding.  Genitourinary:  Small external hemorrhoid at 6:00.    ED Course  Procedures (including critical care time) Labs Review Labs Reviewed - No data to display  Imaging Review No results found.   MDM   1. Food poisoning   2. External hemorrhoid    Discussed importance of fluid intake. Tramadol prescription provided to use for pain. Reviewed warning signs to go to the ER. Discussed management of chronic constipation.    Melony Overly, MD 06/05/14 2116

## 2014-07-03 ENCOUNTER — Ambulatory Visit: Payer: Medicare Other | Attending: Internal Medicine | Admitting: Internal Medicine

## 2014-07-03 ENCOUNTER — Encounter: Payer: Self-pay | Admitting: Internal Medicine

## 2014-07-03 VITALS — BP 141/92 | HR 82 | Temp 98.0°F | Resp 16 | Wt 130.2 lb

## 2014-07-03 DIAGNOSIS — G35 Multiple sclerosis: Secondary | ICD-10-CM | POA: Diagnosis not present

## 2014-07-03 DIAGNOSIS — B351 Tinea unguium: Secondary | ICD-10-CM | POA: Diagnosis present

## 2014-07-03 DIAGNOSIS — I10 Essential (primary) hypertension: Secondary | ICD-10-CM

## 2014-07-03 DIAGNOSIS — F329 Major depressive disorder, single episode, unspecified: Secondary | ICD-10-CM | POA: Diagnosis not present

## 2014-07-03 DIAGNOSIS — Z79899 Other long term (current) drug therapy: Secondary | ICD-10-CM | POA: Diagnosis not present

## 2014-07-03 DIAGNOSIS — F419 Anxiety disorder, unspecified: Secondary | ICD-10-CM | POA: Insufficient documentation

## 2014-07-03 LAB — COMPLETE METABOLIC PANEL WITH GFR
ALK PHOS: 49 U/L (ref 39–117)
ALT: 9 U/L (ref 0–35)
AST: 13 U/L (ref 0–37)
Albumin: 4.3 g/dL (ref 3.5–5.2)
BUN: 22 mg/dL (ref 6–23)
CO2: 31 mEq/L (ref 19–32)
Calcium: 9.5 mg/dL (ref 8.4–10.5)
Chloride: 101 mEq/L (ref 96–112)
Creat: 0.85 mg/dL (ref 0.50–1.10)
GFR, Est Non African American: 88 mL/min
Glucose, Bld: 83 mg/dL (ref 70–99)
POTASSIUM: 4.4 meq/L (ref 3.5–5.3)
SODIUM: 142 meq/L (ref 135–145)
TOTAL PROTEIN: 6.7 g/dL (ref 6.0–8.3)
Total Bilirubin: 0.5 mg/dL (ref 0.2–1.2)

## 2014-07-03 LAB — CBC WITH DIFFERENTIAL/PLATELET
Basophils Absolute: 0 10*3/uL (ref 0.0–0.1)
Basophils Relative: 0 % (ref 0–1)
Eosinophils Absolute: 0 10*3/uL (ref 0.0–0.7)
Eosinophils Relative: 1 % (ref 0–5)
HCT: 43.2 % (ref 36.0–46.0)
Hemoglobin: 14.9 g/dL (ref 12.0–15.0)
Lymphocytes Relative: 16 % (ref 12–46)
Lymphs Abs: 0.6 10*3/uL — ABNORMAL LOW (ref 0.7–4.0)
MCH: 33.9 pg (ref 26.0–34.0)
MCHC: 34.5 g/dL (ref 30.0–36.0)
MCV: 98.2 fL (ref 78.0–100.0)
MONOS PCT: 11 % (ref 3–12)
MPV: 9.7 fL (ref 8.6–12.4)
Monocytes Absolute: 0.4 10*3/uL (ref 0.1–1.0)
Neutro Abs: 2.5 10*3/uL (ref 1.7–7.7)
Neutrophils Relative %: 72 % (ref 43–77)
PLATELETS: 261 10*3/uL (ref 150–400)
RBC: 4.4 MIL/uL (ref 3.87–5.11)
RDW: 14 % (ref 11.5–15.5)
WBC: 3.5 10*3/uL — ABNORMAL LOW (ref 4.0–10.5)

## 2014-07-03 MED ORDER — TERBINAFINE HCL 250 MG PO TABS: 250.0000 mg | ORAL_TABLET | Freq: Every day | ORAL | Status: DC

## 2014-07-03 NOTE — Progress Notes (Signed)
MRN: [id number] Name: Dawn Peters  Sex: female Age: 34 y.o. DOB: February 27, 1981  Allergies: Review of patient's allergies indicates no known allergies.  Chief Complaint  Patient presents with  . Nail Problem    HPI: Patient is 34 y.o. female who has history of hypertension comes today complaining of nail discoloration in her right hand fingers as per patient started after she had used artificial nails, she has noticed it for the last one month and it is getting worse she denies any fever chills any discharge from the nail bed.  Past Medical History  Diagnosis Date  . MS (multiple sclerosis)   . Hypertension   . Anxiety   . Depression     Past Surgical History  Procedure Laterality Date  . Bilateral hip arthroscopy Left 07/2013  . Joint replacement Bilateral      R 2004 and L 2005      Medication List       This list is accurate as of: 07/03/14  1:24 PM.  Always use your most recent med list.               baclofen 20 MG tablet  Commonly known as:  LIORESAL  Take 20 mg by mouth 3 (three) times daily as needed (for ms).     citalopram 20 MG tablet  Commonly known as:  CELEXA  Take 1 tablet (20 mg total) by mouth daily. NO MORE REFILLS WITHOUT OFFICE VISIT - 2ND NOTICE     gabapentin 800 MG tablet  Commonly known as:  NEURONTIN  Take 800 mg by mouth 3 (three) times daily.     hydrocortisone 2.5 % cream  Apply topically 2 (two) times daily.     lisinopril 20 MG tablet  Commonly known as:  PRINIVIL,ZESTRIL  Take 20 mg by mouth daily.     LORazepam 1 MG tablet  Commonly known as:  ATIVAN  Take 1 tablet (1 mg total) by mouth 2 (two) times daily as needed for anxiety.     multivitamin with minerals Tabs tablet  Take 1 tablet by mouth daily.     norethindrone 0.35 MG tablet  Commonly known as:  CAMILA  Take 1 tablet (0.35 mg total) by mouth daily.     omeprazole 20 MG capsule  Commonly known as:  PRILOSEC  Take 20 mg by mouth daily as needed  (heart burn).     OVER THE COUNTER MEDICATION  Take 1 capsule by mouth 2 (two) times daily. Bee Capsule     oxybutynin 5 MG tablet  Commonly known as:  DITROPAN  Take 5 mg by mouth at bedtime.     TECFIDERA 240 MG Cpdr  Generic drug:  Dimethyl Fumarate  Take 240 mg by mouth 2 (two) times daily.     terbinafine 250 MG tablet  Commonly known as:  LAMISIL  Take 1 tablet (250 mg total) by mouth daily.     tiZANidine 4 MG tablet  Commonly known as:  ZANAFLEX  Take 4 mg by mouth at bedtime.     traMADol 50 MG tablet  Commonly known as:  ULTRAM  Take 1 tablet (50 mg total) by mouth every 6 (six) hours as needed.     Vitamin D (Ergocalciferol) 50000 UNITS Caps capsule  Commonly known as:  DRISDOL  Take 1 capsule (50,000 Units total) by mouth every 7 (seven) days.        Meds ordered this encounter  Medications  . terbinafine (  LAMISIL) 250 MG tablet    Sig: Take 1 tablet (250 mg total) by mouth daily.    Dispense:  30 tablet    Refill:  1    Immunization History  Administered Date(s) Administered  . Influenza,inj,Quad PF,36+ Mos 11/19/2013  . PPD Test 10/01/2012    Family History  Problem Relation Age of Onset  . Breast cancer      paternal grandmother dx age 40  . Colon cancer      paternal grandmother    History  Substance Use Topics  . Smoking status: Never Smoker   . Smokeless tobacco: Never Used  . Alcohol Use: No    Review of Systems   As noted in HPI  Filed Vitals:   07/03/14 0934  BP: 141/92  Pulse: 82  Temp: 98 F (36.7 C)  Resp: 16    Physical Exam  Physical Exam  Cardiovascular: Normal rate and regular rhythm.   Pulmonary/Chest: Breath sounds normal. No respiratory distress. She has no wheezes. She has no rales.  Musculoskeletal: She exhibits no edema.  Greenish/yellowish discoloration in the right hand fingernails, no erythema or discharge noticed from  nail bed.    CBC    Component Value Date/Time   WBC 4.8 08/04/2013 0950    RBC 4.36 08/04/2013 0950   HGB 14.8 08/04/2013 0950   HCT 41.3 08/04/2013 0950   PLT 210 08/04/2013 0950   MCV 94.7 08/04/2013 0950   LYMPHSABS 0.5* 08/04/2013 0950   MONOABS 0.3 08/04/2013 0950   EOSABS 0.0 08/04/2013 0950   BASOSABS 0.0 08/04/2013 0950    CMP     Component Value Date/Time   NA 139 05/20/2014 1249   K 3.7 05/20/2014 1249   CL 100 05/20/2014 1249   CO2 30 05/20/2014 1249   GLUCOSE 68* 05/20/2014 1249   BUN 15 05/20/2014 1249   CREATININE 0.74 05/20/2014 1249   CREATININE 0.83 08/04/2013 0950   CALCIUM 9.6 05/20/2014 1249   PROT 6.9 05/20/2014 1249   ALBUMIN 4.1 05/20/2014 1249   AST 16 05/20/2014 1249   ALT 8 05/20/2014 1249   ALKPHOS 48 05/20/2014 1249   BILITOT 0.7 05/20/2014 1249   GFRNONAA >89 05/20/2014 1249   GFRNONAA >90 08/04/2013 0950   GFRAA >89 05/20/2014 1249   GFRAA >90 08/04/2013 0950    No results found for: CHOL  Lab Results  Component Value Date/Time   HGBA1C 4.7 11/05/2012 11:22 AM    Lab Results  Component Value Date/Time   AST 16 05/20/2014 12:49 PM    Assessment and Plan  Onychomycosis - Plan: terbinafine (LAMISIL) 250 MG tablet, COMPLETE METABOLIC PANEL WITH GFR, CBC with Differential/Platelet  Essential hypertension, benign - Plan:advised patient for DASH diet continue current meds, COMPLETE METABOLIC PANEL WITH GFR, CBC with Differential/Platelet    Return for 2 months for lab CBC and CMP.   This note has been created with Surveyor, quantity. Any transcriptional errors are unintentional.    [name], MD

## 2014-07-03 NOTE — Progress Notes (Signed)
Patient states recently changed nail salons and now has developed a nail fungus To her nails on her right hand

## 2014-07-03 NOTE — Patient Instructions (Signed)
DASH Eating Plan °DASH stands for "Dietary Approaches to Stop Hypertension." The DASH eating plan is a healthy eating plan that has been shown to reduce high blood pressure (hypertension). Additional health benefits may include reducing the risk of type 2 diabetes mellitus, heart disease, and stroke. The DASH eating plan may also help with weight loss. °WHAT DO I NEED TO KNOW ABOUT THE DASH EATING PLAN? °For the DASH eating plan, you will follow these general guidelines: °· Choose foods with a percent daily value for sodium of less than 5% (as listed on the food label). °· Use salt-free seasonings or herbs instead of table salt or sea salt. °· Check with your health care provider or pharmacist before using salt substitutes. °· Eat lower-sodium products, often labeled as "lower sodium" or "no salt added." °· Eat fresh foods. °· Eat more vegetables, fruits, and low-fat dairy products. °· Choose whole grains. Look for the word "whole" as the first word in the ingredient list. °· Choose fish and skinless chicken or turkey more often than red meat. Limit fish, poultry, and meat to 6 oz (170 g) each day. °· Limit sweets, desserts, sugars, and sugary drinks. °· Choose heart-healthy fats. °· Limit cheese to 1 oz (28 g) per day. °· Eat more home-cooked food and less restaurant, buffet, and fast food. °· Limit fried foods. °· Cook foods using methods other than frying. °· Limit canned vegetables. If you do use them, rinse them well to decrease the sodium. °· When eating at a restaurant, ask that your food be prepared with less salt, or no salt if possible. °WHAT FOODS CAN I EAT? °Seek help from a dietitian for individual calorie needs. °Grains °Whole grain or whole wheat bread. Brown rice. Whole grain or whole wheat pasta. Quinoa, bulgur, and whole grain cereals. Low-sodium cereals. Corn or whole wheat flour tortillas. Whole grain cornbread. Whole grain crackers. Low-sodium crackers. °Vegetables °Fresh or frozen vegetables  (raw, steamed, roasted, or grilled). Low-sodium or reduced-sodium tomato and vegetable juices. Low-sodium or reduced-sodium tomato sauce and paste. Low-sodium or reduced-sodium canned vegetables.  °Fruits °All fresh, canned (in natural juice), or frozen fruits. °Meat and Other Protein Products °Ground beef (85% or leaner), grass-fed beef, or beef trimmed of fat. Skinless chicken or turkey. Ground chicken or turkey. Pork trimmed of fat. All fish and seafood. Eggs. Dried beans, peas, or lentils. Unsalted nuts and seeds. Unsalted canned beans. °Dairy °Low-fat dairy products, such as skim or 1% milk, 2% or reduced-fat cheeses, low-fat ricotta or cottage cheese, or plain low-fat yogurt. Low-sodium or reduced-sodium cheeses. °Fats and Oils °Tub margarines without trans fats. Light or reduced-fat mayonnaise and salad dressings (reduced sodium). Avocado. Safflower, olive, or canola oils. Natural peanut or almond butter. °Other °Unsalted popcorn and pretzels. °The items listed above may not be a complete list of recommended foods or beverages. Contact your dietitian for more options. °WHAT FOODS ARE NOT RECOMMENDED? °Grains °White bread. White pasta. White rice. Refined cornbread. Bagels and croissants. Crackers that contain trans fat. °Vegetables °Creamed or fried vegetables. Vegetables in a cheese sauce. Regular canned vegetables. Regular canned tomato sauce and paste. Regular tomato and vegetable juices. °Fruits °Dried fruits. Canned fruit in light or heavy syrup. Fruit juice. °Meat and Other Protein Products °Fatty cuts of meat. Ribs, chicken wings, bacon, sausage, bologna, salami, chitterlings, fatback, hot dogs, bratwurst, and packaged luncheon meats. Salted nuts and seeds. Canned beans with salt. °Dairy °Whole or 2% milk, cream, half-and-half, and cream cheese. Whole-fat or sweetened yogurt. Full-fat   cheeses or blue cheese. Nondairy creamers and whipped toppings. Processed cheese, cheese spreads, or cheese  curds. °Condiments °Onion and garlic salt, seasoned salt, table salt, and sea salt. Canned and packaged gravies. Worcestershire sauce. Tartar sauce. Barbecue sauce. Teriyaki sauce. Soy sauce, including reduced sodium. Steak sauce. Fish sauce. Oyster sauce. Cocktail sauce. Horseradish. Ketchup and mustard. Meat flavorings and tenderizers. Bouillon cubes. Hot sauce. Tabasco sauce. Marinades. Taco seasonings. Relishes. °Fats and Oils °Butter, stick margarine, lard, shortening, ghee, and bacon fat. Coconut, palm kernel, or palm oils. Regular salad dressings. °Other °Pickles and olives. Salted popcorn and pretzels. °The items listed above may not be a complete list of foods and beverages to avoid. Contact your dietitian for more information. °WHERE CAN I FIND MORE INFORMATION? °[employer], [employer]: www.nhlbi.nih.gov/health/health-topics/topics/dash/ °Document Released: 02/10/2011 Document Revised: 07/08/2013 Document Reviewed: 12/26/2012 °[employer]® Patient Information ©2015 [employer]. This information is not intended to replace advice given to you by your health care provider. Make sure you discuss any questions you have with your health care provider. ° °

## 2014-07-08 ENCOUNTER — Telehealth: Payer: Self-pay

## 2014-07-08 ENCOUNTER — Telehealth: Payer: Self-pay | Admitting: Internal Medicine

## 2014-07-08 DIAGNOSIS — Z114 Encounter for screening for human immunodeficiency virus [HIV]: Secondary | ICD-10-CM | POA: Insufficient documentation

## 2014-07-08 NOTE — Telephone Encounter (Signed)
Returned patient phone call and she is aware of her lab results 

## 2014-07-08 NOTE — Telephone Encounter (Signed)
Normal CMP and CBC with just slight low WBC Screening HIV ordered

## 2014-07-08 NOTE — Telephone Encounter (Signed)
Patient is calling to request her blood work results, please f/u with pt.

## 2014-08-07 ENCOUNTER — Encounter (HOSPITAL_COMMUNITY): Payer: Self-pay | Admitting: *Deleted

## 2014-08-07 ENCOUNTER — Emergency Department (HOSPITAL_COMMUNITY): Payer: Medicare Other

## 2014-08-07 ENCOUNTER — Emergency Department (HOSPITAL_COMMUNITY)
Admission: EM | Admit: 2014-08-07 | Discharge: 2014-08-07 | Disposition: A | Discharge status: Home / Self Care | Payer: Medicare Other | Attending: Emergency Medicine | Admitting: Emergency Medicine

## 2014-08-07 DIAGNOSIS — G35 Multiple sclerosis: Secondary | ICD-10-CM | POA: Insufficient documentation

## 2014-08-07 DIAGNOSIS — F329 Major depressive disorder, single episode, unspecified: Secondary | ICD-10-CM | POA: Insufficient documentation

## 2014-08-07 DIAGNOSIS — F419 Anxiety disorder, unspecified: Secondary | ICD-10-CM | POA: Diagnosis not present

## 2014-08-07 DIAGNOSIS — I10 Essential (primary) hypertension: Secondary | ICD-10-CM | POA: Diagnosis not present

## 2014-08-07 DIAGNOSIS — Z79899 Other long term (current) drug therapy: Secondary | ICD-10-CM | POA: Diagnosis not present

## 2014-08-07 DIAGNOSIS — Z7952 Long term (current) use of systemic steroids: Secondary | ICD-10-CM | POA: Insufficient documentation

## 2014-08-07 DIAGNOSIS — M25562 Pain in left knee: Secondary | ICD-10-CM | POA: Insufficient documentation

## 2014-08-07 DIAGNOSIS — M7989 Other specified soft tissue disorders: Secondary | ICD-10-CM | POA: Diagnosis present

## 2014-08-07 DIAGNOSIS — M25572 Pain in left ankle and joints of left foot: Secondary | ICD-10-CM | POA: Insufficient documentation

## 2014-08-07 DIAGNOSIS — M25569 Pain in unspecified knee: Secondary | ICD-10-CM

## 2014-08-07 MED ORDER — TRAMADOL HCL 50 MG PO TABS: 50.0000 mg | ORAL_TABLET | Freq: Four times a day (QID) | ORAL | Status: DC | PRN

## 2014-08-07 MED ORDER — TRAMADOL HCL 50 MG PO TABS
50.0000 mg | ORAL_TABLET | Freq: Once | ORAL | Status: AC
  Administered 2014-08-07: 50 mg via ORAL
  Filled 2014-08-07: qty 1

## 2014-08-07 NOTE — Discharge Instructions (Signed)
Please follow up with your primary care physician in 1-2 days. If you do not have one please call the [facility] number listed above. Please take pain medication and/or muscle relaxants as prescribed and as needed for pain. Please do not drive on narcotic pain medication or on muscle relaxants. Please read all discharge instructions and return precautions.    Ankle Pain Ankle pain is a common symptom. The bones, cartilage, tendons, and muscles of the ankle joint perform a lot of work each day. The ankle joint holds your body weight and allows you to move around. Ankle pain can occur on either side or back of 1 or both ankles. Ankle pain may be sharp and burning or dull and aching. There may be tenderness, stiffness, redness, or warmth around the ankle. The pain occurs more often when a person walks or puts pressure on the ankle. CAUSES  There are many reasons ankle pain can develop. It is important to work with your caregiver to identify the cause since many conditions can impact the bones, cartilage, muscles, and tendons. Causes for ankle pain include:  Injury, including a break (fracture), sprain, or strain often due to a fall, sports, or a high-impact activity.  Swelling (inflammation) of a tendon (tendonitis).  Achilles tendon rupture.  Ankle instability after repeated sprains and strains.  Poor foot alignment.  Pressure on a nerve (tarsal tunnel syndrome).  Arthritis in the ankle or the lining of the ankle.  Crystal formation in the ankle (gout or pseudogout). DIAGNOSIS  A diagnosis is based on your medical history, your symptoms, results of your physical exam, and results of diagnostic tests. Diagnostic tests may include X-ray exams or a computerized magnetic scan (magnetic resonance imaging, MRI). TREATMENT  Treatment will depend on the cause of your ankle pain and may include:  Keeping pressure off the ankle and limiting activities.  Using crutches or other  walking support (a cane or brace).  Using rest, ice, compression, and elevation.  Participating in physical therapy or home exercises.  Wearing shoe inserts or special shoes.  Losing weight.  Taking medications to reduce pain or swelling or receiving an injection.  Undergoing surgery. HOME CARE INSTRUCTIONS   Only take over-the-counter or prescription medicines for pain, discomfort, or fever as directed by your caregiver.  Put ice on the injured area.  Put ice in a plastic bag.  Place a towel between your skin and the bag.  Leave the ice on for 15-20 minutes at a time, 03-04 times a day.  Keep your leg raised (elevated) when possible to lessen swelling.  Avoid activities that cause ankle pain.  Follow specific exercises as directed by your caregiver.  Record how often you have ankle pain, the location of the pain, and what it feels like. This information may be helpful to you and your caregiver.  Ask your caregiver about returning to work or sports and whether you should drive.  Follow up with your caregiver for further examination, therapy, or testing as directed. SEEK MEDICAL CARE IF:   Pain or swelling continues or worsens beyond 1 week.  You have an oral temperature above 102 F (38.9 C).  You are feeling unwell or have chills.  You are having an increasingly difficult time with walking.  You have loss of sensation or other new symptoms.  You have questions or concerns. MAKE SURE YOU:   Understand these instructions.  Will watch your condition.  Will get help right away if you  are not doing well or get worse. Document Released: 08/11/2009 Document Revised: 05/16/2011 Document Reviewed: 08/11/2009 [facility] Patient Information 2015 [place], [place]. This information is not intended to replace advice given to you by your health care provider. Make sure you discuss any questions you have with your health care provider. Knee Pain The knee is the complex joint  between your thigh and your lower leg. It is made up of bones, tendons, ligaments, and cartilage. The bones that make up the knee are:  The femur in the thigh.  The tibia and fibula in the lower leg.  The patella or kneecap riding in the groove on the lower femur. CAUSES  Knee pain is a common complaint with many causes. A few of these causes are:  Injury, such as:  A ruptured ligament or tendon injury.  Torn cartilage.  Medical conditions, such as:  Gout  Arthritis  Infections  Overuse, over training, or overdoing a physical activity. Knee pain can be minor or severe. Knee pain can accompany debilitating injury. Minor knee problems often respond well to self-care measures or get well on their own. More serious injuries may need medical intervention or even surgery. SYMPTOMS The knee is complex. Symptoms of knee problems can vary widely. Some of the problems are:  Pain with movement and weight bearing.  Swelling and tenderness.  Buckling of the knee.  Inability to straighten or extend your knee.  Your knee locks and you cannot straighten it.  Warmth and redness with pain and fever.  Deformity or dislocation of the kneecap. DIAGNOSIS  Determining what is wrong may be very straight forward such as when there is an injury. It can also be challenging because of the complexity of the knee. Tests to make a diagnosis may include:  Your caregiver taking a history and doing a physical exam.  Routine X-rays can be used to rule out other problems. X-rays will not reveal a cartilage tear. Some injuries of the knee can be diagnosed by:  Arthroscopy a surgical technique by which a small video camera is inserted through tiny incisions on the sides of the knee. This procedure is used to examine and repair internal knee joint problems. Tiny instruments can be used during arthroscopy to repair the torn knee cartilage (meniscus).  Arthrography is a radiology technique. A contrast  liquid is directly injected into the knee joint. Internal structures of the knee joint then become visible on X-ray film.  An MRI scan is a non X-ray radiology procedure in which magnetic fields and a computer produce two- or three-dimensional images of the inside of the knee. Cartilage tears are often visible using an MRI scanner. MRI scans have largely replaced arthrography in diagnosing cartilage tears of the knee.  Blood work.  Examination of the fluid that helps to lubricate the knee joint (synovial fluid). This is done by taking a sample out using a needle and a syringe. TREATMENT The treatment of knee problems depends on the cause. Some of these treatments are:  Depending on the injury, proper casting, splinting, surgery, or physical therapy care will be needed.  Give yourself adequate recovery time. Do not overuse your joints. If you begin to get sore during workout routines, back off. Slow down or do fewer repetitions.  For repetitive activities such as cycling or running, maintain your strength and nutrition.  Alternate muscle groups. For example, if you are a weight lifter, work the upper body on one day and the lower body the next.  Either  tight or weak muscles do not give the proper support for your knee. Tight or weak muscles do not absorb the stress placed on the knee joint. Keep the muscles surrounding the knee strong.  Take care of mechanical problems.  If you have flat feet, orthotics or special shoes may help. See your caregiver if you need help.  Arch supports, sometimes with wedges on the inner or outer aspect of the heel, can help. These can shift pressure away from the side of the knee most bothered by osteoarthritis.  A brace called an "unloader" brace also may be used to help ease the pressure on the most arthritic side of the knee.  If your caregiver has prescribed crutches, braces, wraps or ice, use as directed. The acronym for this is PRICE. This means  protection, rest, ice, compression, and elevation.  Nonsteroidal anti-inflammatory drugs (NSAIDs), can help relieve pain. But if taken immediately after an injury, they may actually increase swelling. Take NSAIDs with food in your stomach. Stop them if you develop stomach problems. Do not take these if you have a history of ulcers, stomach pain, or bleeding from the bowel. Do not take without your caregiver's approval if you have problems with fluid retention, heart failure, or kidney problems.  For ongoing knee problems, physical therapy may be helpful.  Glucosamine and chondroitin are over-the-counter dietary supplements. Both may help relieve the pain of osteoarthritis in the knee. These medicines are different from the usual anti-inflammatory drugs. Glucosamine may decrease the rate of cartilage destruction.  Injections of a corticosteroid drug into your knee joint may help reduce the symptoms of an arthritis flare-up. They may provide pain relief that lasts a few months. You may have to wait a few months between injections. The injections do have a small increased risk of infection, water retention, and elevated blood sugar levels.  Hyaluronic acid injected into damaged joints may ease pain and provide lubrication. These injections may work by reducing inflammation. A series of shots may give relief for as long as 6 months.  Topical painkillers. Applying certain ointments to your skin may help relieve the pain and stiffness of osteoarthritis. Ask your pharmacist for suggestions. Many over the-counter products are approved for temporary relief of arthritis pain.  In some countries, doctors often prescribe topical NSAIDs for relief of chronic conditions such as arthritis and tendinitis. A review of treatment with NSAID creams found that they worked as well as oral medications but without the serious side effects. PREVENTION  Maintain a healthy weight. Extra pounds put more strain on your  joints.  Get strong, stay limber. Weak muscles are a common cause of knee injuries. Stretching is important. Include flexibility exercises in your workouts.  Be smart about exercise. If you have osteoarthritis, chronic knee pain or recurring injuries, you may need to change the way you exercise. This does not mean you have to stop being active. If your knees ache after jogging or playing basketball, consider switching to swimming, water aerobics, or other low-impact activities, at least for a few days a week. Sometimes limiting high-impact activities will provide relief.  Make sure your shoes fit well. Choose footwear that is right for your sport.  Protect your knees. Use the proper gear for knee-sensitive activities. Use kneepads when playing volleyball or laying carpet. Buckle your seat belt every time you drive. Most shattered kneecaps occur in car accidents.  Rest when you are tired. SEEK MEDICAL CARE IF:  You have knee pain that is continual  and does not seem to be getting better.  SEEK IMMEDIATE MEDICAL CARE IF:  Your knee joint feels hot to the touch and you have a high fever. MAKE SURE YOU:   Understand these instructions.  Will watch your condition.  Will get help right away if you are not doing well or get worse. Document Released: 12/19/2006 Document Revised: 05/16/2011 Document Reviewed: 12/19/2006 [facility] Patient Information 2015 [place], [place]. This information is not intended to replace advice given to you by your health care provider. Make sure you discuss any questions you have with your health care provider.

## 2014-08-07 NOTE — ED Notes (Signed)
Patient transported to X-ray 

## 2014-08-07 NOTE — ED Provider Notes (Signed)
CSN: [id number]     Arrival date & time 08/07/14  0031 History   First MD Initiated Contact with Patient 08/07/14 0049     Chief Complaint  Patient presents with  . Joint Swelling    L knee and ankle     (Consider location/radiation/quality/duration/timing/severity/associated sxs/prior Treatment) HPI Comments: Patient is a 34 yo F PMHx significant for MS, HTN, Anxiety, Depression presenting to the ED for evaluation of left knee and ankle swelling and pain that began yesterday without precipitating trauma or injury. She states she has tried over-the-counter ibuprofen and Tylenol with little to no relief. Denies any fevers. No history of left knee or ankle surgeries. Does endorse a history of tendinitis in her left knee. No modifying factors identified. Patient does have an orthopedist in [place].   Past Medical History  Diagnosis Date  . MS (multiple sclerosis)   . Hypertension   . Anxiety   . Depression    Past Surgical History  Procedure Laterality Date  . Bilateral hip arthroscopy Left 07/2013  . Joint replacement Bilateral      R 2004 and L 2005   Family History  Problem Relation Age of Onset  . Breast cancer      paternal grandmother dx age 40  . Colon cancer      paternal grandmother   History  Substance Use Topics  . Smoking status: Never Smoker   . Smokeless tobacco: Never Used  . Alcohol Use: No   OB History    Gravida Para Term Preterm AB TAB SAB Ectopic Multiple Living   1 1 1       1      Review of Systems  Musculoskeletal: Positive for joint swelling and arthralgias.  All other systems reviewed and are negative.     Allergies  Review of patient's allergies indicates no known allergies.  Home Medications   Prior to Admission medications   Medication Sig Start Date End Date Taking? Authorizing Provider  baclofen (LIORESAL) 20 MG tablet Take 20 mg by mouth 3 (three) times daily as needed (for ms).     Historical Provider, MD  citalopram (CELEXA)  20 MG tablet Take 1 tablet (20 mg total) by mouth daily. NO MORE REFILLS WITHOUT OFFICE VISIT - 2ND NOTICE 11/05/13   Mancel Bale, PA-C  Dimethyl Fumarate (TECFIDERA) 240 MG CPDR Take 240 mg by mouth 2 (two) times daily.    Historical Provider, MD  gabapentin (NEURONTIN) 800 MG tablet Take 800 mg by mouth 3 (three) times daily.    Historical Provider, MD  hydrocortisone 2.5 % cream Apply topically 2 (two) times daily. 05/20/14   [name], MD  lisinopril (PRINIVIL,ZESTRIL) 20 MG tablet Take 20 mg by mouth daily.    Historical Provider, MD  LORazepam (ATIVAN) 1 MG tablet Take 1 tablet (1 mg total) by mouth 2 (two) times daily as needed for anxiety. 03/10/14   [name], MD  Multiple Vitamin (MULTIVITAMIN WITH MINERALS) TABS Take 1 tablet by mouth daily.    Historical Provider, MD  norethindrone (CAMILA) 0.35 MG tablet Take 1 tablet (0.35 mg total) by mouth daily. 11/01/13 11/01/14  Cresenciano Genre, MD  omeprazole (PRILOSEC) 20 MG capsule Take 20 mg by mouth daily as needed (heart burn).  07/10/13   Historical Provider, MD  OVER THE COUNTER MEDICATION Take 1 capsule by mouth 2 (two) times daily. Bee Capsule    Historical Provider, MD  oxybutynin (DITROPAN) 5 MG tablet Take 5 mg by mouth  at bedtime.  07/10/13   Historical Provider, MD  terbinafine (LAMISIL) 250 MG tablet Take 1 tablet (250 mg total) by mouth daily. 07/03/14   [name], MD  tiZANidine (ZANAFLEX) 4 MG tablet Take 4 mg by mouth at bedtime.  07/07/13   Historical Provider, MD  traMADol (ULTRAM) 50 MG tablet Take 1 tablet (50 mg total) by mouth every 6 (six) hours as needed. 06/05/14   Melony Overly, MD  traMADol (ULTRAM) 50 MG tablet Take 1 tablet (50 mg total) by mouth every 6 (six) hours as needed. 08/07/14   Maddeline Roorda, PA-C  Vitamin D, Ergocalciferol, (DRISDOL) 50000 UNITS CAPS capsule Take 1 capsule (50,000 Units total) by mouth every 7 (seven) days. 03/10/14   Deepak Advani, MD   BP 132/90 mmHg  Pulse 86  Temp(Src) 97.6 F  (36.4 C) (Oral)  Resp 18  Ht 5\' 5"  (1.651 m)  Wt 130 lb (58.968 kg)  BMI 21.63 kg/m2  SpO2 99%  LMP 05/07/2014 Physical Exam  Constitutional: She is oriented to person, place, and time. She appears well-developed and well-nourished. No distress.  HENT:  Head: Normocephalic and atraumatic.  Right Ear: External ear normal.  Left Ear: External ear normal.  Nose: Nose normal.  Mouth/Throat: Oropharynx is clear and moist.  Eyes: Conjunctivae are normal.  Neck: Normal range of motion. Neck supple.  No nuchal rigidity.   Cardiovascular: Normal rate, regular rhythm, normal heart sounds and intact distal pulses.   Pulmonary/Chest: Effort normal and breath sounds normal.  Abdominal: Soft.  Musculoskeletal: Normal range of motion.       Left hip: She exhibits normal range of motion, no tenderness and no deformity.       Left knee: She exhibits normal range of motion, no swelling, no effusion and no erythema. Tenderness found.       Left ankle: She exhibits swelling. She exhibits normal range of motion, no deformity, no laceration and normal pulse. Tenderness.  Neurological: She is alert and oriented to person, place, and time.  Skin: Skin is warm and dry. She is not diaphoretic.  Psychiatric: She has a normal mood and affect.  Nursing note and vitals reviewed.   ED Course  Procedures (including critical care time) Medications  traMADol (ULTRAM) tablet 50 mg (50 mg Oral Given 08/07/14 0156)    Labs Review Labs Reviewed - No data to display  Imaging Review Dg Ankle Complete Left  08/07/2014   CLINICAL DATA:  Left lateral ankle swelling, acute onset. Initial encounter.  EXAM: LEFT ANKLE COMPLETE - 3+ VIEW  COMPARISON:  None.  FINDINGS: There is no evidence of fracture or dislocation. The ankle mortise is intact; the interosseous space is within normal limits. No talar tilt or subluxation is seen.  The joint spaces are preserved. Mild medial ankle swelling is suggested.  IMPRESSION: No  evidence of fracture or dislocation.   Electronically Signed   By: Garald Balding M.D.   On: 08/07/2014 01:48   Dg Knee Complete 4 Views Left  08/07/2014   CLINICAL DATA:  Left knee pain and swelling, acute onset. Initial encounter.  EXAM: LEFT KNEE - COMPLETE 4+ VIEW  COMPARISON:  Left knee radiographs performed 05/11/2005  FINDINGS: There is no evidence of fracture or dislocation. The joint spaces are preserved. No significant degenerative change is seen; the patellofemoral joint is grossly unremarkable in appearance.  No significant joint effusion is seen. The visualized soft tissues are normal in appearance.  IMPRESSION: No evidence of fracture or  dislocation.   Electronically Signed   By: Garald Balding M.D.   On: 08/07/2014 01:47     EKG Interpretation None      MDM   Final diagnoses:  Knee pain  Left ankle pain    Filed Vitals:   08/07/14 0042  BP: 132/90  Pulse: 86  Temp: 97.6 F (36.4 C)  Resp: 18   Afebrile, NAD, non-toxic appearing, AAOx4.  Patient X-Ray negative for obvious fracture or dislocation. Neurovascularly intact. Normal sensation. No evidence of compartment syndrome. No overlying erythema, warmth or decreased range of motion to suggest infectious process. Pain managed in ED. Pt advised to follow up with PCP and/or orthopedist. Patient given ace wrap while in ED, conservative therapy recommended and discussed. Patient will be dc home &  is agreeable with above plan. Patient is stable at time of discharge       Baron Sane, PA-C 08/07/14 [place], DO 08/07/14 0246

## 2014-08-07 NOTE — ED Notes (Signed)
Pt states yesterday started having L knee swelling/pain and L ankle swelling

## 2014-08-19 ENCOUNTER — Emergency Department (INDEPENDENT_AMBULATORY_CARE_PROVIDER_SITE_OTHER)
Admission: EM | Admit: 2014-08-19 | Discharge: 2014-08-19 | Disposition: A | Discharge status: Nursing Home (Includes ICF) | Payer: Medicare Other | Source: Home / Self Care | Attending: Family Medicine | Admitting: Family Medicine

## 2014-08-19 ENCOUNTER — Emergency Department (HOSPITAL_COMMUNITY)
Admission: EM | Admit: 2014-08-19 | Discharge: 2014-08-19 | Disposition: A | Discharge status: Home / Self Care | Payer: Medicare Other | Attending: Emergency Medicine | Admitting: Emergency Medicine

## 2014-08-19 ENCOUNTER — Encounter (HOSPITAL_COMMUNITY): Payer: Self-pay | Admitting: Emergency Medicine

## 2014-08-19 DIAGNOSIS — F329 Major depressive disorder, single episode, unspecified: Secondary | ICD-10-CM | POA: Insufficient documentation

## 2014-08-19 DIAGNOSIS — Z7952 Long term (current) use of systemic steroids: Secondary | ICD-10-CM | POA: Diagnosis not present

## 2014-08-19 DIAGNOSIS — R51 Headache: Secondary | ICD-10-CM | POA: Insufficient documentation

## 2014-08-19 DIAGNOSIS — R519 Headache, unspecified: Secondary | ICD-10-CM

## 2014-08-19 DIAGNOSIS — Z79899 Other long term (current) drug therapy: Secondary | ICD-10-CM | POA: Diagnosis not present

## 2014-08-19 DIAGNOSIS — I10 Essential (primary) hypertension: Secondary | ICD-10-CM | POA: Diagnosis not present

## 2014-08-19 DIAGNOSIS — F419 Anxiety disorder, unspecified: Secondary | ICD-10-CM | POA: Diagnosis not present

## 2014-08-19 DIAGNOSIS — H53149 Visual discomfort, unspecified: Secondary | ICD-10-CM | POA: Diagnosis not present

## 2014-08-19 DIAGNOSIS — R251 Tremor, unspecified: Secondary | ICD-10-CM | POA: Insufficient documentation

## 2014-08-19 DIAGNOSIS — G35 Multiple sclerosis: Secondary | ICD-10-CM | POA: Insufficient documentation

## 2014-08-19 DIAGNOSIS — Z793 Long term (current) use of hormonal contraceptives: Secondary | ICD-10-CM | POA: Diagnosis not present

## 2014-08-19 MED ORDER — METOCLOPRAMIDE HCL 5 MG/ML IJ SOLN
10.0000 mg | Freq: Once | INTRAMUSCULAR | Status: AC
  Administered 2014-08-19: 10 mg via INTRAMUSCULAR
  Filled 2014-08-19: qty 2

## 2014-08-19 MED ORDER — DIPHENHYDRAMINE HCL 50 MG/ML IJ SOLN
25.0000 mg | Freq: Once | INTRAMUSCULAR | Status: AC
  Administered 2014-08-19: 25 mg via INTRAMUSCULAR
  Filled 2014-08-19: qty 1

## 2014-08-19 MED ORDER — KETOROLAC TROMETHAMINE 60 MG/2ML IM SOLN
60.0000 mg | Freq: Once | INTRAMUSCULAR | Status: AC
  Administered 2014-08-19: 60 mg via INTRAMUSCULAR
  Filled 2014-08-19: qty 2

## 2014-08-19 NOTE — ED Notes (Signed)
Pt being transferred to ED via shuttle to be further evaluated for her right temple pain, 10/10 sharp pain. Report called to Neldon Mc RN ED.

## 2014-08-19 NOTE — ED Provider Notes (Signed)
CSN: [id number]     Arrival date & time 08/19/14  1406 History  This chart was scribed for non-physician practitioner Jeannett Senior, PA-C working with Tanna Furry, MD by Meriel Pica, ED Scribe. This patient was seen in room TR03C/TR03C and the patient's care was started at 3:23 PM.    Chief Complaint  Patient presents with  . Headache   The history is provided by the patient. No language interpreter was used.   HPI Comments: Dawn Peters is a 34 y.o. female, with a PMhx of MS, HTN, anxiety, and depression, who presents to the Emergency Department complaining of sudden onset, intermittent, sharp/throbbing right temple pain that began 2 days ago. She reports the intermittent pain radiates to her right eyelid. She also notes associated photophobia. No pain in the eye itself or changes in vision. Pt was not involved in any activity when the headache came on. She says the pain worsened this morning, waking her from sleep. Pt notes the pain is exacerbated on movement of her head. She has taken tylenol PM and aspirin without relief.This patient was transferred to the ED via shuttle from Urgent Care to be further evaluated for her headache. Pt has a termor at baseline due to MS and is prescribed Zanaflex, Lamisil, and Lorazepam by her neurologist, Dr. Annitta Needs. She denies vision changes, blurred vision, nausea, vomiting, fevers, chills, or new weakness/numbness in her extremities. NKDA  Past Medical History  Diagnosis Date  . MS (multiple sclerosis)   . Hypertension   . Anxiety   . Depression    Past Surgical History  Procedure Laterality Date  . Bilateral hip arthroscopy Left 07/2013  . Joint replacement Bilateral      R 2004 and L 2005   Family History  Problem Relation Age of Onset  . Breast cancer      paternal grandmother dx age 19  . Colon cancer      paternal grandmother   History  Substance Use Topics  . Smoking status: Never Smoker   . Smokeless tobacco: Never Used   . Alcohol Use: No   OB History    Gravida Para Term Preterm AB TAB SAB Ectopic Multiple Living   1 1 1       1      Review of Systems  Constitutional: Negative for fever and chills.  Eyes: Positive for photophobia. Negative for visual disturbance.  Gastrointestinal: Negative for nausea, vomiting and diarrhea.  Neurological: Positive for headaches. Negative for weakness and numbness.    Allergies  Review of patient's allergies indicates no known allergies.  Home Medications   Prior to Admission medications   Medication Sig Start Date End Date Taking? Authorizing Provider  baclofen (LIORESAL) 20 MG tablet Take 20 mg by mouth 3 (three) times daily as needed (for ms).     Historical Provider, MD  citalopram (CELEXA) 20 MG tablet Take 1 tablet (20 mg total) by mouth daily. NO MORE REFILLS WITHOUT OFFICE VISIT - 2ND NOTICE 11/05/13   Mancel Bale, PA-C  Dimethyl Fumarate (TECFIDERA) 240 MG CPDR Take 240 mg by mouth 2 (two) times daily.    Historical Provider, MD  gabapentin (NEURONTIN) 800 MG tablet Take 800 mg by mouth 3 (three) times daily.    Historical Provider, MD  hydrocortisone 2.5 % cream Apply topically 2 (two) times daily. 05/20/14   [name], MD  lisinopril (PRINIVIL,ZESTRIL) 20 MG tablet Take 20 mg by mouth daily.    Historical Provider, MD  LORazepam (ATIVAN)  1 MG tablet Take 1 tablet (1 mg total) by mouth 2 (two) times daily as needed for anxiety. 03/10/14   [name], MD  Multiple Vitamin (MULTIVITAMIN WITH MINERALS) TABS Take 1 tablet by mouth daily.    Historical Provider, MD  norethindrone (CAMILA) 0.35 MG tablet Take 1 tablet (0.35 mg total) by mouth daily. 11/01/13 11/01/14  Cresenciano Genre, MD  omeprazole (PRILOSEC) 20 MG capsule Take 20 mg by mouth daily as needed (heart burn).  07/10/13   Historical Provider, MD  OVER THE COUNTER MEDICATION Take 1 capsule by mouth 2 (two) times daily. Bee Capsule    Historical Provider, MD  oxybutynin (DITROPAN) 5 MG tablet Take 5 mg  by mouth at bedtime.  07/10/13   Historical Provider, MD  terbinafine (LAMISIL) 250 MG tablet Take 1 tablet (250 mg total) by mouth daily. 07/03/14   [name], MD  tiZANidine (ZANAFLEX) 4 MG tablet Take 4 mg by mouth at bedtime.  07/07/13   Historical Provider, MD  traMADol (ULTRAM) 50 MG tablet Take 1 tablet (50 mg total) by mouth every 6 (six) hours as needed. 06/05/14   Melony Overly, MD  traMADol (ULTRAM) 50 MG tablet Take 1 tablet (50 mg total) by mouth every 6 (six) hours as needed. 08/07/14   Jennifer Piepenbrink, PA-C  Vitamin D, Ergocalciferol, (DRISDOL) 50000 UNITS CAPS capsule Take 1 capsule (50,000 Units total) by mouth every 7 (seven) days. 03/10/14   [name], MD   BP 137/82 mmHg  Pulse 77  Temp(Src) 97.9 F (36.6 C) (Oral)  Resp 17  Ht 5\' 5"  (1.651 m)  Wt 140 lb (63.504 kg)  BMI 23.30 kg/m2  SpO2 100%  LMP 08/05/2014 (Approximate) Physical Exam  Constitutional: She is oriented to person, place, and time. She appears well-developed and well-nourished. No distress.  HENT:  Head: Normocephalic.  Right Ear: External ear normal.  Left Ear: External ear normal.  Nose: Nose normal.  Mouth/Throat: Oropharynx is clear and moist. No oropharyngeal exudate.  Eyes: Conjunctivae and EOM are normal. Pupils are equal, round, and reactive to light. Right eye exhibits no discharge. Left eye exhibits no discharge. No scleral icterus.  Neck: Neck supple. No JVD present.  Cardiovascular: Normal rate, regular rhythm and normal heart sounds.   Pulmonary/Chest: Effort normal and breath sounds normal. No respiratory distress.  Musculoskeletal: She exhibits no edema.  Lymphadenopathy:    She has no cervical adenopathy.  Neurological: She is alert and oriented to person, place, and time.  5/5 and equal upper and lower extremity strength bilaterally. Equal grip strength bilaterally. Normal finger to nose and heel to shin. No pronator drift. Patellar reflexes 2+   Skin: Skin is warm. No rash  noted. No erythema. No pallor.  Psychiatric: She has a normal mood and affect. Her behavior is normal.  Nursing note and vitals reviewed.   ED Course  Procedures  DIAGNOSTIC STUDIES: Oxygen Saturation is 100% on RA, normal by my interpretation.    COORDINATION OF CARE: 3:29 PM Discussed treatment plan which includes to order diagnostic imaging and labs with pt. Pt acknowledges and agrees to plan.   Labs Review Labs Reviewed - No data to display  Imaging Review No results found.   EKG Interpretation None      MDM   Final diagnoses:  Nonintractable headache, unspecified chronicity pattern, unspecified headache type     patient with right temple pain, intermittent for 2 days. Denies thunderclap headache. Denies any associated neurological complaints. No fever.  No neck pain or stiffness. She does have a mass which is relatively controlled at this time. She was sent here from urgent care for imaging. At this time I do not think she needs a CT or MRI for evaluation given no neurological complaints or findings on exam. I do not think she has anterior cerebral hemorrhage given intermittent pain for 2 days. She is nontoxic appearing. I discussed this patient with Dr. Jeneen Rinks who agreed. Will try to get her pain under control. Patient received Toradol, Reglan, Benadryl.   Patient is pain-free, wants to be discharged home. She states she is feeling much better. Question possible migraine. Consider temporal arteritis but I think patient is relatively young for this diagnosis with no visual changes. I advised her to call her neurologist tomorrow. Return precautions discussed.  Filed Vitals:   08/19/14 1411 08/19/14 1642  BP: 137/82 120/84  Pulse: 77 75  Temp: 97.9 F (36.6 C) 98 F (36.7 C)  TempSrc: Oral Oral  Resp: 17 16  Height: 5\' 5"  (1.651 m)   Weight: 140 lb (63.504 kg)   SpO2: 100% 100%   I personally performed the services described in this documentation, which was scribed in  my presence. The recorded information has been reviewed and is accurate.    Jeannett Senior, PA-C 08/19/14 [place], MD 08/22/14 [phone]

## 2014-08-19 NOTE — ED Provider Notes (Signed)
CSN: [id number]     Arrival date & time 08/19/14  1259 History   First MD Initiated Contact with Patient 08/19/14 1315     Chief Complaint  Patient presents with  . Headache   (Consider location/radiation/quality/duration/timing/severity/associated sxs/prior Treatment) HPI Comments: 34 year old female with history of multiple sclerosis presents with moderate to severe sharp and throbbing pain in the right temporal since yesterday. She was able to sleep fairly well last night but upon awakening this morning the pain returned and worsened. Pain is located in the right temporal it radiates over the right eye. It produces a sharp lancinating pains that occur frequently through the temporal and over the right eye. She does not have a history of frequent headaches nor she had a headache similar to this. She denies problems with vision, speech, hearing, swallowing, focal paresthesias or weakness.   Past Medical History  Diagnosis Date  . MS (multiple sclerosis)   . Hypertension   . Anxiety   . Depression    Past Surgical History  Procedure Laterality Date  . Bilateral hip arthroscopy Left 07/2013  . Joint replacement Bilateral      R 2004 and L 2005   Family History  Problem Relation Age of Onset  . Breast cancer      paternal grandmother dx age 85  . Colon cancer      paternal grandmother   History  Substance Use Topics  . Smoking status: Never Smoker   . Smokeless tobacco: Never Used  . Alcohol Use: No   OB History    Gravida Para Term Preterm AB TAB SAB Ectopic Multiple Living   1 1 1       1      Review of Systems  Constitutional: Positive for activity change. Negative for fever and fatigue.  HENT: Negative for ear pain and rhinorrhea.   Eyes: Negative.  Negative for visual disturbance.  Respiratory: Negative for cough and shortness of breath.   Cardiovascular: Negative for chest pain and leg swelling.  Gastrointestinal: Negative.   Genitourinary: Negative.    Musculoskeletal: Negative for myalgias, back pain and neck pain.  Skin: Negative.   Neurological: Positive for tremors and headaches. Negative for dizziness, syncope, facial asymmetry, speech difficulty, weakness and numbness.  Psychiatric/Behavioral: Negative for behavioral problems, confusion, self-injury and agitation.    Allergies  Review of patient's allergies indicates no known allergies.  Home Medications   Prior to Admission medications   Medication Sig Start Date End Date Taking? Authorizing Provider  baclofen (LIORESAL) 20 MG tablet Take 20 mg by mouth 3 (three) times daily as needed (for ms).    Yes Historical Provider, MD  lisinopril (PRINIVIL,ZESTRIL) 20 MG tablet Take 20 mg by mouth daily.   Yes Historical Provider, MD  norethindrone (CAMILA) 0.35 MG tablet Take 1 tablet (0.35 mg total) by mouth daily. 11/01/13 11/01/14 Yes Cresenciano Genre, MD  citalopram (CELEXA) 20 MG tablet Take 1 tablet (20 mg total) by mouth daily. NO MORE REFILLS WITHOUT OFFICE VISIT - 2ND NOTICE 11/05/13   Mancel Bale, PA-C  Dimethyl Fumarate (TECFIDERA) 240 MG CPDR Take 240 mg by mouth 2 (two) times daily.    Historical Provider, MD  gabapentin (NEURONTIN) 800 MG tablet Take 800 mg by mouth 3 (three) times daily.    Historical Provider, MD  hydrocortisone 2.5 % cream Apply topically 2 (two) times daily. 05/20/14   [name], MD  LORazepam (ATIVAN) 1 MG tablet Take 1 tablet (1 mg total) by mouth  2 (two) times daily as needed for anxiety. 03/10/14   [name], MD  Multiple Vitamin (MULTIVITAMIN WITH MINERALS) TABS Take 1 tablet by mouth daily.    Historical Provider, MD  omeprazole (PRILOSEC) 20 MG capsule Take 20 mg by mouth daily as needed (heart burn).  07/10/13   Historical Provider, MD  OVER THE COUNTER MEDICATION Take 1 capsule by mouth 2 (two) times daily. Bee Capsule    Historical Provider, MD  oxybutynin (DITROPAN) 5 MG tablet Take 5 mg by mouth at bedtime.  07/10/13   Historical Provider, MD   terbinafine (LAMISIL) 250 MG tablet Take 1 tablet (250 mg total) by mouth daily. 07/03/14   [name], MD  tiZANidine (ZANAFLEX) 4 MG tablet Take 4 mg by mouth at bedtime.  07/07/13   Historical Provider, MD  traMADol (ULTRAM) 50 MG tablet Take 1 tablet (50 mg total) by mouth every 6 (six) hours as needed. 06/05/14   Melony Overly, MD  traMADol (ULTRAM) 50 MG tablet Take 1 tablet (50 mg total) by mouth every 6 (six) hours as needed. 08/07/14   Jennifer Piepenbrink, PA-C  Vitamin D, Ergocalciferol, (DRISDOL) 50000 UNITS CAPS capsule Take 1 capsule (50,000 Units total) by mouth every 7 (seven) days. 03/10/14   [name], MD   BP 194/84 mmHg  Pulse 98  Temp(Src) 98.2 F (36.8 C) (Oral)  SpO2 98%  LMP 08/05/2014 (Approximate) Physical Exam  Constitutional: She is oriented to person, place, and time. She appears well-developed and well-nourished. No distress.  HENT:  Mouth/Throat: Oropharynx is clear and moist. No oropharyngeal exudate.  Tenderness over the right temple and right supraorbital ridge. Tongue is midline and uvula midline. Soft palate rises symmetrically. Swallowing mechanism is intact.    Eyes: Conjunctivae and EOM are normal. Pupils are equal, round, and reactive to light.  Neck: Normal range of motion. Neck supple.  Rotating head right to left increases the frequency of sharp pains.  Cardiovascular: Normal rate, regular rhythm and normal heart sounds.   Pulmonary/Chest: Effort normal and breath sounds normal. No respiratory distress.  Musculoskeletal: Normal range of motion. She exhibits no edema.  Lymphadenopathy:    She has cervical adenopathy.  Neurological: She is alert and oriented to person, place, and time. She displays tremor. No cranial nerve deficit or sensory deficit. She exhibits normal muscle tone. Coordination abnormal.  Positive tremor and mild dysmetria likely secondary to MS.  Nursing note and vitals reviewed.   ED Course  Procedures (including critical  care time) Labs Review Labs Reviewed - No data to display  Imaging Review No results found.   MDM   1. Acute intractable headache, unspecified headache type    Transfer via shuttle to [facility] for evaluation of acute headache.    Janne Napoleon, NP 08/19/14 1352

## 2014-08-19 NOTE — ED Notes (Signed)
Pt. Stated, I've had this pain right on my temple for the last 2 days.  I've took Tylenol and no help.

## 2014-08-19 NOTE — ED Notes (Signed)
Pt ambulated to the bathroom with ease 

## 2014-08-19 NOTE — Discharge Instructions (Signed)
Try taking Excedrin Migraine for your headache, please call a follow-up with your neurologist for further evaluation if pain continues. Return if symptoms are worsening.  General Headache Without Cause A headache is pain or discomfort felt around the head or neck area. The specific cause of a headache may not be found. There are many causes and types of headaches. A few common ones are:  Tension headaches.  Migraine headaches.  Cluster headaches.  Chronic daily headaches. HOME CARE INSTRUCTIONS   Keep all follow-up appointments with your caregiver or any specialist referral.  Only take over-the-counter or prescription medicines for pain or discomfort as directed by your caregiver.  Lie down in a dark, quiet room when you have a headache.  Keep a headache journal to find out what may trigger your migraine headaches. For example, write down:  What you eat and drink.  How much sleep you get.  Any change to your diet or medicines.  Try massage or other relaxation techniques.  Put ice packs or heat on the head and neck. Use these 3 to 4 times per day for 15 to 20 minutes each time, or as needed.  Limit stress.  Sit up straight, and do not tense your muscles.  Quit smoking if you smoke.  Limit alcohol use.  Decrease the amount of caffeine you drink, or stop drinking caffeine.  Eat and sleep on a regular schedule.  Get 7 to 9 hours of sleep, or as recommended by your caregiver.  Keep lights dim if bright lights bother you and make your headaches worse. SEEK MEDICAL CARE IF:   You have problems with the medicines you were prescribed.  Your medicines are not working.  You have a change from the usual headache.  You have nausea or vomiting. SEEK IMMEDIATE MEDICAL CARE IF:   Your headache becomes severe.  You have a fever.  You have a stiff neck.  You have loss of vision.  You have muscular weakness or loss of muscle control.  You start losing your balance or  have trouble walking.  You feel faint or pass out.  You have severe symptoms that are different from your first symptoms. MAKE SURE YOU:   Understand these instructions.  Will watch your condition.  Will get help right away if you are not doing well or get worse. Document Released: 02/21/2005 Document Revised: 05/16/2011 Document Reviewed: 03/09/2011 [facility] Patient Information 2015 [place], [place]. This information is not intended to replace advice given to you by your health care provider. Make sure you discuss any questions you have with your health care provider.

## 2014-08-19 NOTE — ED Notes (Signed)
Pt has been suffering from a sharp pain in her right temple since yesterday.  She was able to sleep last night, but the pain was still there when she woke up this morning.

## 2014-08-28 ENCOUNTER — Other Ambulatory Visit: Payer: Self-pay | Admitting: Internal Medicine

## 2014-09-23 ENCOUNTER — Ambulatory Visit: Payer: Self-pay | Admitting: Neurology

## 2014-09-24 ENCOUNTER — Emergency Department (INDEPENDENT_AMBULATORY_CARE_PROVIDER_SITE_OTHER)
Admission: EM | Admit: 2014-09-24 | Discharge: 2014-09-24 | Disposition: A | Discharge status: Home / Self Care | Payer: Self-pay | Source: Home / Self Care | Attending: Family Medicine | Admitting: Family Medicine

## 2014-09-24 ENCOUNTER — Encounter (HOSPITAL_COMMUNITY): Payer: Self-pay | Admitting: Emergency Medicine

## 2014-09-24 DIAGNOSIS — M791 Myalgia: Secondary | ICD-10-CM

## 2014-09-24 DIAGNOSIS — T148 Other injury of unspecified body region: Secondary | ICD-10-CM

## 2014-09-24 DIAGNOSIS — T148XXA Other injury of unspecified body region, initial encounter: Secondary | ICD-10-CM

## 2014-09-24 DIAGNOSIS — M7918 Myalgia, other site: Secondary | ICD-10-CM

## 2014-09-24 MED ORDER — METHOCARBAMOL 500 MG PO TABS: 500.0000 mg | ORAL_TABLET | Freq: Four times a day (QID) | ORAL | Status: DC | PRN

## 2014-09-24 MED ORDER — DICLOFENAC SODIUM 75 MG PO TBEC: 75.0000 mg | DELAYED_RELEASE_TABLET | Freq: Two times a day (BID) | ORAL | Status: DC
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
## 2014-10-06 ENCOUNTER — Encounter: Payer: Self-pay | Admitting: Neurology

## 2014-11-20 ENCOUNTER — Telehealth: Payer: Self-pay

## 2014-11-20 DIAGNOSIS — N760 Acute vaginitis: Principal | ICD-10-CM

## 2014-11-20 DIAGNOSIS — B9689 Other specified bacterial agents as the cause of diseases classified elsewhere: Secondary | ICD-10-CM

## 2014-11-20 NOTE — Telephone Encounter (Signed)
Pt request a Rx for BV.  Pt has not been seen in over a year and has canceled her 12/19/14 appt.

## 2014-11-24 NOTE — Telephone Encounter (Signed)
Called patient, no answer- left message stating we are trying to reach you to return your phone call, please call us back at the clinics 

## 2014-11-25 MED ORDER — METRONIDAZOLE 500 MG PO TABS: 500.0000 mg | ORAL_TABLET | Freq: Two times a day (BID) | ORAL | Status: DC

## 2014-11-25 NOTE — Telephone Encounter (Signed)
Called patient and she states that she was recently using the feminine spray down there and now has a discharge with a fishy odor. Discussed with patient that we can send a Rx to her pharmacy and talked about discontinuing use of scented hygiene products or douches as they can cause bacterial infections like this or yeast infections. Also discussed need for follow up for annual exam for pap smear as her last pap was in 8/13. Patient verbalized understanding to all. Told patient our front office will contact her to set up an appt for annual exam. Patient verbalized understanding and had no questions.

## 2014-12-18 ENCOUNTER — Other Ambulatory Visit (HOSPITAL_COMMUNITY)
Admission: RE | Admit: 2014-12-18 | Discharge: 2014-12-18 | Disposition: A | Discharge status: Home / Self Care | Payer: Medicare Other | Source: Ambulatory Visit | Attending: Obstetrics & Gynecology | Admitting: Obstetrics & Gynecology

## 2014-12-18 ENCOUNTER — Ambulatory Visit (INDEPENDENT_AMBULATORY_CARE_PROVIDER_SITE_OTHER): Payer: Medicare Other | Admitting: Obstetrics & Gynecology

## 2014-12-18 ENCOUNTER — Encounter: Payer: Self-pay | Admitting: Obstetrics & Gynecology

## 2014-12-18 VITALS — BP 133/88 | HR 102 | Temp 98.3°F | Ht 66.0 in | Wt 157.5 lb

## 2014-12-18 DIAGNOSIS — Z3041 Encounter for surveillance of contraceptive pills: Secondary | ICD-10-CM | POA: Diagnosis not present

## 2014-12-18 DIAGNOSIS — Z01411 Encounter for gynecological examination (general) (routine) with abnormal findings: Secondary | ICD-10-CM | POA: Insufficient documentation

## 2014-12-18 DIAGNOSIS — Z1151 Encounter for screening for human papillomavirus (HPV): Secondary | ICD-10-CM | POA: Insufficient documentation

## 2014-12-18 DIAGNOSIS — Z01419 Encounter for gynecological examination (general) (routine) without abnormal findings: Secondary | ICD-10-CM

## 2014-12-18 DIAGNOSIS — Z124 Encounter for screening for malignant neoplasm of cervix: Secondary | ICD-10-CM

## 2014-12-18 MED ORDER — NORGESTIM-ETH ESTRAD TRIPHASIC 0.18/0.215/0.25 MG-25 MCG PO TABS: 1.0000 | ORAL_TABLET | Freq: Every day | ORAL | Status: DC

## 2014-12-18 NOTE — Progress Notes (Signed)
Patient ID: Dawn Peters, female   DOB: 1980-11-08, 34 y.o.   MRN: [id number]  Chief Complaint  Patient presents with  . Gynecologic Exam    annual    HPI Dawn Peters is a 34 y.o. female.  G1P1001 Patient's last menstrual period was 12/15/2014. Irregular menses on Camila for years. Wants to continue OCP.  HPI  Past Medical History  Diagnosis Date  . MS (multiple sclerosis) ([place])   . Hypertension   . Anxiety   . Depression     Past Surgical History  Procedure Laterality Date  . Bilateral hip arthroscopy Left 07/2013  . Joint replacement Bilateral      R 2004 and L 2005    Family History  Problem Relation Age of Onset  . Breast cancer      paternal grandmother dx age 77  . Colon cancer      paternal grandmother    Social History Social History  Substance Use Topics  . Smoking status: Never Smoker   . Smokeless tobacco: Never Used  . Alcohol Use: No    No Known Allergies  Current Outpatient Prescriptions  Medication Sig Dispense Refill  . baclofen (LIORESAL) 20 MG tablet Take 20 mg by mouth 3 (three) times daily as needed (for ms).     . citalopram (CELEXA) 20 MG tablet Take 1 tablet (20 mg total) by mouth daily. NO MORE REFILLS WITHOUT OFFICE VISIT - 2ND NOTICE 15 tablet 0  . diclofenac (VOLTAREN) 75 MG EC tablet Take 1 tablet (75 mg total) by mouth 2 (two) times daily. 60 tablet 0  . Dimethyl Fumarate (TECFIDERA) 240 MG CPDR Take 240 mg by mouth 2 (two) times daily.    Marland Kitchen gabapentin (NEURONTIN) 800 MG tablet Take 800 mg by mouth 3 (three) times daily.    . hydrocortisone 2.5 % cream Apply topically 2 (two) times daily. 30 g 0  . lisinopril (PRINIVIL,ZESTRIL) 20 MG tablet Take 20 mg by mouth daily.    Marland Kitchen LORazepam (ATIVAN) 1 MG tablet Take 1 tablet (1 mg total) by mouth 2 (two) times daily as needed for anxiety. 20 tablet 0  . methocarbamol (ROBAXIN) 500 MG tablet Take 1-2 tablets (500-1,000 mg total) by mouth every 6 (six) hours as needed for  muscle spasms. 60 tablet 0  . metroNIDAZOLE (FLAGYL) 500 MG tablet Take 1 tablet (500 mg total) by mouth 2 (two) times daily. 14 tablet 0  . Multiple Vitamin (MULTIVITAMIN WITH MINERALS) TABS Take 1 tablet by mouth daily.    Marland Kitchen omeprazole (PRILOSEC) 20 MG capsule Take 20 mg by mouth daily as needed (heart burn).     Marland Kitchen OVER THE COUNTER MEDICATION Take 1 capsule by mouth 2 (two) times daily. Bee Capsule    . oxybutynin (DITROPAN) 5 MG tablet Take 5 mg by mouth at bedtime.     . terbinafine (LAMISIL) 250 MG tablet Take 1 tablet (250 mg total) by mouth daily. 30 tablet 1  . tiZANidine (ZANAFLEX) 4 MG tablet Take 4 mg by mouth at bedtime.     . traMADol (ULTRAM) 50 MG tablet Take 1 tablet (50 mg total) by mouth every 6 (six) hours as needed. 15 tablet 0  . traMADol (ULTRAM) 50 MG tablet Take 1 tablet (50 mg total) by mouth every 6 (six) hours as needed. 15 tablet 0  . Vitamin D, Ergocalciferol, (DRISDOL) 50000 UNITS CAPS capsule Take 1 capsule (50,000 Units total) by mouth every 7 (seven) days. 12 capsule 0  .  norethindrone (CAMILA) 0.35 MG tablet Take 1 tablet (0.35 mg total) by mouth daily. 1 Package 11  . Norgestimate-Ethinyl Estradiol Triphasic 0.18/0.215/0.25 MG-25 MCG tab Take 1 tablet by mouth daily. 1 Package 11  . [DISCONTINUED] Cetirizine HCl 10 MG CAPS Take 10 mg q d prn allergies 30 capsule 0   No current facility-administered medications for this visit.    Review of Systems Review of Systems  Constitutional: Negative.   Respiratory: Negative.   Gastrointestinal: Negative.   Genitourinary: Positive for vaginal bleeding (dark blood) and menstrual problem (irregular menses). Negative for vaginal pain.    Blood pressure 133/88, pulse 102, temperature 98.3 F (36.8 C), temperature source Oral, height 5\' 6"  (1.676 m), weight 157 lb 8 oz (71.442 kg), last menstrual period 12/15/2014.  Physical Exam Physical Exam  Constitutional: She is oriented to person, place, and time. She appears  well-developed. No distress.  Cardiovascular: Normal rate and normal heart sounds.   Pulmonary/Chest: Effort normal and breath sounds normal.  Breasts: breasts appear normal, no suspicious masses, no skin or nipple changes or axillary nodes.   Abdominal: Soft. There is no tenderness.  Genitourinary: Uterus normal. Vaginal discharge (dark bloody mucus) found.  Pelvic exam: normal external genitalia, vulva, vagina, cervix, uterus and adnexa.  Pap done   Neurological: She is alert and oriented to person, place, and time.  Psychiatric: She has a normal mood and affect. Her behavior is normal.    Data Reviewed   Assessment    Well woman exam normal Oral contraceptive use, on POP, candidate for combination Ocp.     Plan    Tri sprintec lo rx given Pap done  RTC for annual        Dawn Peters,JAMES 12/18/2014, 4:35 PM

## 2014-12-19 ENCOUNTER — Ambulatory Visit: Payer: Medicare Other | Admitting: Obstetrics and Gynecology

## 2014-12-19 NOTE — Patient Instructions (Signed)
Oral Contraception Use Oral contraceptive pills (OCPs) are medicines taken to prevent pregnancy. OCPs work by preventing the ovaries from releasing eggs. The hormones in OCPs also cause the cervical mucus to thicken, preventing the sperm from entering the uterus. The hormones also cause the uterine lining to become thin, not allowing a fertilized egg to attach to the inside of the uterus. OCPs are highly effective when taken exactly as prescribed. However, OCPs do not prevent sexually transmitted diseases (STDs). Safe sex practices, such as using condoms along with an OCP, can help prevent STDs. Before taking OCPs, you may have a physical exam and Pap test. Your health care provider may also order blood tests if necessary. Your health care provider will make sure you are a good candidate for oral contraception. Discuss with your health care provider the possible side effects of the OCP you may be prescribed. When starting an OCP, it can take 2 to 3 months for the body to adjust to the changes in hormone levels in your body.  HOW TO TAKE ORAL CONTRACEPTIVE PILLS Your health care provider may advise you on how to start taking the first cycle of OCPs. Otherwise, you can:   Start on day 1 of your menstrual period. You will not need any backup contraceptive protection with this start time.   Start on the first Sunday after your menstrual period or the day you get your prescription. In these cases, you will need to use backup contraceptive protection for the first week.   Start the pill at any time of your cycle. If you take the pill within 5 days of the start of your period, you are protected against pregnancy right away. In this case, you will not need a backup form of birth control. If you start at any other time of your menstrual cycle, you will need to use another form of birth control for 7 days. If your OCP is the type called a minipill, it will protect you from pregnancy after taking it for 2 days (48  hours). After you have started taking OCPs:   If you forget to take 1 pill, take it as soon as you remember. Take the next pill at the regular time.   If you miss 2 or more pills, call your health care provider because different pills have different instructions for missed doses. Use backup birth control until your next menstrual period starts.   If you use a 28-day pack that contains inactive pills and you miss 1 of the last 7 pills (pills with no hormones), it will not matter. Throw away the rest of the non-hormone pills and start a new pill pack.  No matter which day you start the OCP, you will always start a new pack on that same day of the week. Have an extra pack of OCPs and a backup contraceptive method available in case you miss some pills or lose your OCP pack.  HOME CARE INSTRUCTIONS   Do not smoke.   Always use a condom to protect against STDs. OCPs do not protect against STDs.   Use a calendar to mark your menstrual period days.   Read the information and directions that came with your OCP. Talk to your health care provider if you have questions.  SEEK MEDICAL CARE IF:   You develop nausea and vomiting.   You have abnormal vaginal discharge or bleeding.   You develop a rash.   You miss your menstrual period.   You are losing   your hair.   You need treatment for mood swings or depression.   You get dizzy when taking the OCP.   You develop acne from taking the OCP.   You become pregnant.  SEEK IMMEDIATE MEDICAL CARE IF:   You develop chest pain.   You develop shortness of breath.   You have an uncontrolled or severe headache.   You develop numbness or slurred speech.   You develop visual problems.   You develop pain, redness, and swelling in the legs.    This information is not intended to replace advice given to you by your health care provider. Make sure you discuss any questions you have with your health care provider.   Document  Released: 02/10/2011 Document Revised: 03/14/2014 Document Reviewed: 08/12/2012 Elsevier Interactive Patient Education 2016 [employer]  

## 2014-12-22 LAB — CYTOLOGY - PAP

## 2015-01-27 ENCOUNTER — Emergency Department (HOSPITAL_COMMUNITY)
Admission: EM | Admit: 2015-01-27 | Discharge: 2015-01-27 | Disposition: A | Discharge status: Home / Self Care | Payer: Medicare Other | Attending: Emergency Medicine | Admitting: Emergency Medicine

## 2015-01-27 ENCOUNTER — Emergency Department (HOSPITAL_COMMUNITY): Payer: Medicare Other

## 2015-01-27 ENCOUNTER — Encounter (HOSPITAL_COMMUNITY): Payer: Self-pay | Admitting: Emergency Medicine

## 2015-01-27 DIAGNOSIS — Z7952 Long term (current) use of systemic steroids: Secondary | ICD-10-CM | POA: Diagnosis not present

## 2015-01-27 DIAGNOSIS — Z793 Long term (current) use of hormonal contraceptives: Secondary | ICD-10-CM | POA: Insufficient documentation

## 2015-01-27 DIAGNOSIS — R51 Headache: Secondary | ICD-10-CM | POA: Insufficient documentation

## 2015-01-27 DIAGNOSIS — Z79899 Other long term (current) drug therapy: Secondary | ICD-10-CM | POA: Insufficient documentation

## 2015-01-27 DIAGNOSIS — R519 Headache, unspecified: Secondary | ICD-10-CM

## 2015-01-27 DIAGNOSIS — F419 Anxiety disorder, unspecified: Secondary | ICD-10-CM | POA: Diagnosis not present

## 2015-01-27 DIAGNOSIS — I10 Essential (primary) hypertension: Secondary | ICD-10-CM | POA: Insufficient documentation

## 2015-01-27 DIAGNOSIS — R202 Paresthesia of skin: Secondary | ICD-10-CM | POA: Insufficient documentation

## 2015-01-27 DIAGNOSIS — Z3202 Encounter for pregnancy test, result negative: Secondary | ICD-10-CM | POA: Insufficient documentation

## 2015-01-27 DIAGNOSIS — Z792 Long term (current) use of antibiotics: Secondary | ICD-10-CM | POA: Insufficient documentation

## 2015-01-27 DIAGNOSIS — G35 Multiple sclerosis: Secondary | ICD-10-CM | POA: Insufficient documentation

## 2015-01-27 DIAGNOSIS — F329 Major depressive disorder, single episode, unspecified: Secondary | ICD-10-CM | POA: Diagnosis not present

## 2015-01-27 DIAGNOSIS — Z791 Long term (current) use of non-steroidal anti-inflammatories (NSAID): Secondary | ICD-10-CM | POA: Diagnosis not present

## 2015-01-27 LAB — I-STAT CHEM 8, ED
BUN: 19 mg/dL (ref 6–20)
CALCIUM ION: 1.1 mmol/L — AB (ref 1.12–1.23)
CHLORIDE: 100 mmol/L — AB (ref 101–111)
Creatinine, Ser: 0.8 mg/dL (ref 0.44–1.00)
GLUCOSE: 100 mg/dL — AB (ref 65–99)
HCT: 43 % (ref 36.0–46.0)
Hemoglobin: 14.6 g/dL (ref 12.0–15.0)
Potassium: 3.5 mmol/L (ref 3.5–5.1)
Sodium: 139 mmol/L (ref 135–145)
TCO2: 26 mmol/L (ref 0–100)

## 2015-01-27 LAB — POC URINE PREG, ED: PREG TEST UR: NEGATIVE

## 2015-01-27 MED ORDER — DEXAMETHASONE SODIUM PHOSPHATE 10 MG/ML IJ SOLN
10.0000 mg | Freq: Once | INTRAMUSCULAR | Status: AC
  Administered 2015-01-27: 10 mg via INTRAMUSCULAR
  Filled 2015-01-27: qty 1

## 2015-01-27 MED ORDER — KETOROLAC TROMETHAMINE 60 MG/2ML IM SOLN
60.0000 mg | Freq: Once | INTRAMUSCULAR | Status: AC
  Administered 2015-01-27: 60 mg via INTRAMUSCULAR
  Filled 2015-01-27: qty 2

## 2015-01-27 NOTE — ED Provider Notes (Signed)
CSN: [id number]     Arrival date & time 01/27/15  0415 History   First MD Initiated Contact with Patient 01/27/15 0429     Chief Complaint  Patient presents with  . Tingling     (Consider location/radiation/quality/duration/timing/severity/associated sxs/prior Treatment) Patient is a 34 y.o. female presenting with headaches. The history is provided by the patient.  Headache Pain location:  R temporal Quality:  Dull Radiates to:  Does not radiate Onset quality:  Gradual Timing:  Constant Progression:  Unchanged Chronicity:  New Context: not straining   Relieved by:  Nothing Worsened by:  Nothing Ineffective treatments: just took a vicodin. Associated symptoms: no back pain, no blurred vision, no dizziness, no ear pain, no fever, no myalgias, no neck pain, no neck stiffness, no numbness, no paresthesias, no photophobia, no syncope and no weakness   Risk factors: does not have insomnia     Past Medical History  Diagnosis Date  . MS (multiple sclerosis) ([place])   . Hypertension   . Anxiety   . Depression    Past Surgical History  Procedure Laterality Date  . Bilateral hip arthroscopy Left 07/2013  . Joint replacement Bilateral      R 2004 and L 2005   Family History  Problem Relation Age of Onset  . Breast cancer      paternal grandmother dx age 8  . Colon cancer      paternal grandmother   Social History  Substance Use Topics  . Smoking status: Never Smoker   . Smokeless tobacco: Never Used  . Alcohol Use: No   OB History    Gravida Para Term Preterm AB TAB SAB Ectopic Multiple Living   1 1 1       1      Review of Systems  Constitutional: Negative for fever.  HENT: Negative for ear pain.   Eyes: Negative for blurred vision and photophobia.  Cardiovascular: Negative for syncope.  Musculoskeletal: Negative for myalgias, back pain, neck pain and neck stiffness.  Neurological: Positive for headaches. Negative for dizziness, tremors, syncope, facial asymmetry,  speech difficulty, weakness, numbness and paresthesias.  All other systems reviewed and are negative.     Allergies  Review of patient's allergies indicates no known allergies.  Home Medications   Prior to Admission medications   Medication Sig Start Date End Date Taking? Authorizing Provider  baclofen (LIORESAL) 20 MG tablet Take 20 mg by mouth 3 (three) times daily as needed (for ms).     Historical Provider, MD  citalopram (CELEXA) 20 MG tablet Take 1 tablet (20 mg total) by mouth daily. NO MORE REFILLS WITHOUT OFFICE VISIT - 2ND NOTICE 11/05/13   Mancel Bale, PA-C  diclofenac (VOLTAREN) 75 MG EC tablet Take 1 tablet (75 mg total) by mouth 2 (two) times daily. 09/24/14   Waldemar Dickens, MD  Dimethyl Fumarate (TECFIDERA) 240 MG CPDR Take 240 mg by mouth 2 (two) times daily.    Historical Provider, MD  gabapentin (NEURONTIN) 800 MG tablet Take 800 mg by mouth 3 (three) times daily.    Historical Provider, MD  hydrocortisone 2.5 % cream Apply topically 2 (two) times daily. 05/20/14   [name], MD  lisinopril (PRINIVIL,ZESTRIL) 20 MG tablet Take 20 mg by mouth daily.    Historical Provider, MD  LORazepam (ATIVAN) 1 MG tablet Take 1 tablet (1 mg total) by mouth 2 (two) times daily as needed for anxiety. 03/10/14   [name], MD  methocarbamol (ROBAXIN) 500 MG  tablet Take 1-2 tablets (500-1,000 mg total) by mouth every 6 (six) hours as needed for muscle spasms. 09/24/14   Waldemar Dickens, MD  metroNIDAZOLE (FLAGYL) 500 MG tablet Take 1 tablet (500 mg total) by mouth 2 (two) times daily. 11/25/14   Lavonia Drafts, MD  Multiple Vitamin (MULTIVITAMIN WITH MINERALS) TABS Take 1 tablet by mouth daily.    Historical Provider, MD  norethindrone (CAMILA) 0.35 MG tablet Take 1 tablet (0.35 mg total) by mouth daily. 11/01/13 11/01/14  Nino Glow McLean-Scocozza, MD  Norgestimate-Ethinyl Estradiol Triphasic 0.18/0.215/0.25 MG-25 MCG tab Take 1 tablet by mouth daily. 12/18/14   Woodroe Mode, MD   omeprazole (PRILOSEC) 20 MG capsule Take 20 mg by mouth daily as needed (heart burn).  07/10/13   Historical Provider, MD  OVER THE COUNTER MEDICATION Take 1 capsule by mouth 2 (two) times daily. Bee Capsule    Historical Provider, MD  oxybutynin (DITROPAN) 5 MG tablet Take 5 mg by mouth at bedtime.  07/10/13   Historical Provider, MD  terbinafine (LAMISIL) 250 MG tablet Take 1 tablet (250 mg total) by mouth daily. 07/03/14   [name], MD  tiZANidine (ZANAFLEX) 4 MG tablet Take 4 mg by mouth at bedtime.  07/07/13   Historical Provider, MD  traMADol (ULTRAM) 50 MG tablet Take 1 tablet (50 mg total) by mouth every 6 (six) hours as needed. 06/05/14   Melony Overly, MD  traMADol (ULTRAM) 50 MG tablet Take 1 tablet (50 mg total) by mouth every 6 (six) hours as needed. 08/07/14   Jennifer Piepenbrink, PA-C  Vitamin D, Ergocalciferol, (DRISDOL) 50000 UNITS CAPS capsule Take 1 capsule (50,000 Units total) by mouth every 7 (seven) days. 03/10/14   [name], MD   BP 149/99 mmHg  Pulse 100  Temp(Src) 97.8 F (36.6 C) (Oral)  Resp 16  SpO2 100%  LMP 01/13/2015 Physical Exam  Constitutional: She is oriented to person, place, and time. She appears well-developed and well-nourished. No distress.  HENT:  Head: Normocephalic and atraumatic.  Right Ear: Tympanic membrane is not injected and not erythematous.  Left Ear: Tympanic membrane is not injected and not erythematous.  Mouth/Throat: Oropharynx is clear and moist.  Intact cognition  Eyes: Conjunctivae and EOM are normal. Pupils are equal, round, and reactive to light.  Neck: Normal range of motion. Neck supple.  Cardiovascular: Normal rate, regular rhythm and intact distal pulses.   Pulmonary/Chest: Effort normal and breath sounds normal. No respiratory distress. She has no wheezes. She has no rales.  Abdominal: Soft. Bowel sounds are normal. There is no tenderness. There is no rebound and no guarding.  Neurological: She is alert and oriented to  person, place, and time. She has normal reflexes. She displays no tremor and normal reflexes. No cranial nerve deficit or sensory deficit. She exhibits normal muscle tone. Coordination and gait normal. GCS eye subscore is 4. GCS verbal subscore is 5. GCS motor subscore is 6.  Reflex Scores:      Tricep reflexes are 2+ on the right side and 2+ on the left side.      Bicep reflexes are 2+ on the right side and 2+ on the left side.      Brachioradialis reflexes are 2+ on the right side and 2+ on the left side.      Patellar reflexes are 2+ on the right side and 2+ on the left side.      Achilles reflexes are 2+ on the right side and 2+  on the left side. Skin: Skin is warm and dry.  Psychiatric: She has a normal mood and affect.    ED Course  Procedures (including critical care time) Labs Review Labs Reviewed  I-STAT CHEM 8, ED - Abnormal; Notable for the following:    Chloride 100 (*)    Glucose, Bld 100 (*)    Calcium, Ion 1.10 (*)    All other components within normal limits  POC URINE PREG, ED    Imaging Review No results found. I have personally reviewed and evaluated these images and lab results as part of my medical decision-making.   EKG Interpretation None      MDM   Final diagnoses:  None    Examined with intact sensation and motor.  Intact extraoculars and intact cognition.  Pain markedly improved.  Follow up with your neurologist.       Egbert Seidel, MD 01/27/15 [phone]

## 2015-01-27 NOTE — ED Notes (Addendum)
Patient c/o R arm tingling ("pins and needles") x 2 hours - states the tingling woke her up.  Patient also c/o headache starting yesterday.  Patient hx MS and anxiety.  Patient ambulatory and A&O x 4.

## 2015-01-27 NOTE — ED Notes (Signed)
Patient verbalized understanding of discharge instructions and denies any further needs nor questions at this time. VS stable.

## 2015-01-27 NOTE — Discharge Instructions (Signed)

## 2015-01-27 NOTE — ED Notes (Signed)
MD at bedside. 

## 2015-02-25 ENCOUNTER — Telehealth: Payer: Self-pay

## 2015-02-25 ENCOUNTER — Telehealth: Payer: Self-pay | Admitting: Pharmacist

## 2015-02-25 NOTE — Telephone Encounter (Signed)
Called patient to follow up on request for citalopram. Patient reports that she needs follow up and is ok to wait until she is established with a new provider here (Dr. Annitta Needs was her primary care provider) to get a refill on citalopram. Transferred patient to the front desk to schedule follow up.   Nicoletta Ba, PharmD, BCPS, [facility] [phone]

## 2015-02-25 NOTE — Telephone Encounter (Signed)
Left message on patient voicemail to call the office back.

## 2015-02-25 NOTE — Telephone Encounter (Signed)
Pt called requesting pap smear results.

## 2015-02-27 NOTE — Telephone Encounter (Signed)
Called patient back and was unable to leave a message.

## 2015-03-03 NOTE — Telephone Encounter (Signed)
error 

## 2015-03-16 ENCOUNTER — Emergency Department (INDEPENDENT_AMBULATORY_CARE_PROVIDER_SITE_OTHER)
Admission: EM | Admit: 2015-03-16 | Discharge: 2015-03-16 | Disposition: A | Discharge status: Home / Self Care | Payer: Medicare Other | Source: Home / Self Care | Attending: Family Medicine | Admitting: Family Medicine

## 2015-03-16 ENCOUNTER — Encounter (HOSPITAL_COMMUNITY): Payer: Self-pay | Admitting: *Deleted

## 2015-03-16 DIAGNOSIS — J0101 Acute recurrent maxillary sinusitis: Secondary | ICD-10-CM

## 2015-03-16 MED ORDER — IPRATROPIUM BROMIDE 0.06 % NA SOLN: 2.0000 | Freq: Four times a day (QID) | NASAL | Status: DC

## 2015-03-16 MED ORDER — DOXYCYCLINE HYCLATE 100 MG PO CAPS: 100.0000 mg | ORAL_CAPSULE | Freq: Two times a day (BID) | ORAL | Status: DC

## 2015-03-16 NOTE — ED Provider Notes (Signed)
CSN: [id number]     Arrival date & time 03/16/15  1634 History   First MD Initiated Contact with Patient 03/16/15 1650     No chief complaint on file.  (Consider location/radiation/quality/duration/timing/severity/associated sxs/prior Treatment) Patient is a 35 y.o. female presenting with URI. The history is provided by the patient.  URI Presenting symptoms: congestion, cough and rhinorrhea   Presenting symptoms: no fever and no sore throat   Severity:  Moderate Onset quality:  Gradual Duration:  8 days Progression:  Unchanged Chronicity:  New Ineffective treatments:  OTC medications Associated symptoms: no wheezing     Past Medical History  Diagnosis Date  . MS (multiple sclerosis) ([place])   . Hypertension   . Anxiety   . Depression    Past Surgical History  Procedure Laterality Date  . Bilateral hip arthroscopy Left 07/2013  . Joint replacement Bilateral      R 2004 and L 2005   Family History  Problem Relation Age of Onset  . Breast cancer      paternal grandmother dx age 43  . Colon cancer      paternal grandmother   Social History  Substance Use Topics  . Smoking status: Never Smoker   . Smokeless tobacco: Never Used  . Alcohol Use: No   OB History    Gravida Para Term Preterm AB TAB SAB Ectopic Multiple Living   1 1 1       1      Review of Systems  Constitutional: Negative.  Negative for fever.  HENT: Positive for congestion, postnasal drip and rhinorrhea. Negative for sore throat.   Respiratory: Positive for cough. Negative for shortness of breath and wheezing.   All other systems reviewed and are negative.   Allergies  Review of patient's allergies indicates no known allergies.  Home Medications   Prior to Admission medications   Medication Sig Start Date End Date Taking? Authorizing Provider  baclofen (LIORESAL) 20 MG tablet Take 20 mg by mouth 3 (three) times daily as needed (for ms).     Historical Provider, MD  citalopram (CELEXA) 20 MG tablet  Take 1 tablet (20 mg total) by mouth daily. NO MORE REFILLS WITHOUT OFFICE VISIT - 2ND NOTICE 11/05/13   Mancel Bale, PA-C  diclofenac (VOLTAREN) 75 MG EC tablet Take 1 tablet (75 mg total) by mouth 2 (two) times daily. Patient not taking: Reported on 01/27/2015 09/24/14   Waldemar Dickens, MD  Dimethyl Fumarate (TECFIDERA) 240 MG CPDR Take 240 mg by mouth 2 (two) times daily.    Historical Provider, MD  doxycycline (VIBRAMYCIN) 100 MG capsule Take 1 capsule (100 mg total) by mouth 2 (two) times daily. 03/16/15   Billy Fischer, MD  gabapentin (NEURONTIN) 800 MG tablet Take 800 mg by mouth 3 (three) times daily.    Historical Provider, MD  HYDROcodone-acetaminophen (NORCO/VICODIN) 5-325 MG tablet Take 1 tablet by mouth every 6 (six) hours as needed for moderate pain.    Historical Provider, MD  hydrocortisone 2.5 % cream Apply topically 2 (two) times daily. Patient not taking: Reported on 01/27/2015 05/20/14   [name], MD  ipratropium (ATROVENT) 0.06 % nasal spray Place 2 sprays into both nostrils 4 (four) times daily. 03/16/15   Billy Fischer, MD  lisinopril (PRINIVIL,ZESTRIL) 20 MG tablet Take 20 mg by mouth daily.    Historical Provider, MD  LORazepam (ATIVAN) 1 MG tablet Take 1 tablet (1 mg total) by mouth 2 (two) times daily as needed  for anxiety. Patient not taking: Reported on 01/27/2015 03/10/14   [name], MD  methocarbamol (ROBAXIN) 500 MG tablet Take 1-2 tablets (500-1,000 mg total) by mouth every 6 (six) hours as needed for muscle spasms. Patient not taking: Reported on 01/27/2015 09/24/14   Waldemar Dickens, MD  metroNIDAZOLE (FLAGYL) 500 MG tablet Take 1 tablet (500 mg total) by mouth 2 (two) times daily. Patient not taking: Reported on 01/27/2015 11/25/14   Lavonia Drafts, MD  norethindrone (CAMILA) 0.35 MG tablet Take 1 tablet (0.35 mg total) by mouth daily. Patient not taking: Reported on 01/27/2015 11/01/13 11/01/14  Nino Glow McLean-Scocozza, MD  Norgestimate-Ethinyl Estradiol  Triphasic 0.18/0.215/0.25 MG-25 MCG tab Take 1 tablet by mouth daily. Patient not taking: Reported on 01/27/2015 12/18/14   Woodroe Mode, MD  omeprazole (PRILOSEC) 20 MG capsule Take 20 mg by mouth daily as needed (heart burn).  07/10/13   Historical Provider, MD  oxybutynin (DITROPAN) 5 MG tablet Take 5 mg by mouth at bedtime.  07/10/13   Historical Provider, MD  terbinafine (LAMISIL) 250 MG tablet Take 1 tablet (250 mg total) by mouth daily. Patient not taking: Reported on 01/27/2015 07/03/14   [name], MD  tiZANidine (ZANAFLEX) 4 MG tablet Take 4 mg by mouth at bedtime.  07/07/13   Historical Provider, MD  traMADol (ULTRAM) 50 MG tablet Take 1 tablet (50 mg total) by mouth every 6 (six) hours as needed. Patient not taking: Reported on 01/27/2015 06/05/14   Melony Overly, MD  traMADol (ULTRAM) 50 MG tablet Take 1 tablet (50 mg total) by mouth every 6 (six) hours as needed. Patient not taking: Reported on 01/27/2015 08/07/14   Baron Sane, PA-C  Vitamin D, Ergocalciferol, (DRISDOL) 50000 UNITS CAPS capsule Take 1 capsule (50,000 Units total) by mouth every 7 (seven) days. Patient not taking: Reported on 01/27/2015 03/10/14   [name], MD   Meds Ordered and Administered this Visit  Medications - No data to display  BP 135/82 mmHg  Pulse 100  Temp(Src) 99 F (37.2 C) (Oral)  Resp 18  SpO2 97% No data found.   Physical Exam  Constitutional: She is oriented to person, place, and time. She appears well-developed and well-nourished. No distress.  HENT:  Right Ear: External ear normal.  Left Ear: External ear normal.  Mouth/Throat: Oropharynx is clear and moist.  Neck: Normal range of motion. Neck supple.  Cardiovascular: Normal heart sounds.   Pulmonary/Chest: Effort normal and breath sounds normal.  Lymphadenopathy:    She has no cervical adenopathy.  Neurological: She is alert and oriented to person, place, and time.  Skin: Skin is warm and dry.  Nursing note and vitals  reviewed.   ED Course  Procedures (including critical care time)  Labs Review Labs Reviewed - No data to display  Imaging Review No results found.   Visual Acuity Review  Right Eye Distance:   Left Eye Distance:   Bilateral Distance:    Right Eye Near:   Left Eye Near:    Bilateral Near:         MDM   1. Acute recurrent maxillary sinusitis        Billy Fischer, MD 03/16/15 [phone]

## 2015-03-16 NOTE — ED Notes (Signed)
Pt  Reports  Symptoms      Of  Cough  /   Congested            Symptoms  X  1   Week    pt  Reports  Symptoms    X  4  Days   Appears  In no  Acute  Severe  Distress

## 2015-04-13 ENCOUNTER — Encounter (HOSPITAL_COMMUNITY): Payer: Self-pay

## 2015-04-13 ENCOUNTER — Emergency Department (HOSPITAL_COMMUNITY)
Admission: EM | Admit: 2015-04-13 | Discharge: 2015-04-13 | Disposition: A | Discharge status: Home / Self Care | Payer: Medicare Other | Attending: Emergency Medicine | Admitting: Emergency Medicine

## 2015-04-13 ENCOUNTER — Emergency Department (HOSPITAL_COMMUNITY): Payer: Medicare Other

## 2015-04-13 DIAGNOSIS — M25562 Pain in left knee: Secondary | ICD-10-CM | POA: Diagnosis present

## 2015-04-13 DIAGNOSIS — F419 Anxiety disorder, unspecified: Secondary | ICD-10-CM | POA: Insufficient documentation

## 2015-04-13 DIAGNOSIS — I10 Essential (primary) hypertension: Secondary | ICD-10-CM | POA: Insufficient documentation

## 2015-04-13 DIAGNOSIS — Z793 Long term (current) use of hormonal contraceptives: Secondary | ICD-10-CM | POA: Diagnosis not present

## 2015-04-13 DIAGNOSIS — F329 Major depressive disorder, single episode, unspecified: Secondary | ICD-10-CM | POA: Diagnosis not present

## 2015-04-13 DIAGNOSIS — Z8669 Personal history of other diseases of the nervous system and sense organs: Secondary | ICD-10-CM | POA: Insufficient documentation

## 2015-04-13 DIAGNOSIS — Z79899 Other long term (current) drug therapy: Secondary | ICD-10-CM | POA: Insufficient documentation

## 2015-04-13 MED ORDER — IBUPROFEN 600 MG PO TABS: 600.0000 mg | ORAL_TABLET | Freq: Four times a day (QID) | ORAL | Status: DC | PRN

## 2015-04-13 MED ORDER — DICLOFENAC SODIUM 1 % TD GEL: 4.0000 g | Freq: Four times a day (QID) | TRANSDERMAL | Status: DC

## 2015-04-13 MED ORDER — IBUPROFEN 800 MG PO TABS
800.0000 mg | ORAL_TABLET | Freq: Once | ORAL | Status: AC
  Administered 2015-04-13: 800 mg via ORAL
  Filled 2015-04-13: qty 1

## 2015-04-13 NOTE — ED Provider Notes (Signed)
CSN: [id number]     Arrival date & time 04/13/15  0010 History   First MD Initiated Contact with Patient 04/13/15 0049     Chief Complaint  Patient presents with  . Knee Pain    Left     (Consider location/radiation/quality/duration/timing/severity/associated sxs/prior Treatment) Patient is a 35 y.o. female presenting with knee pain. The history is provided by the patient. No language interpreter was used.  Knee Pain Location:  Knee Time since incident:  10 days Injury: no   Knee location:  L knee Associated symptoms: no fever   Associated symptoms comment:  Patient with history MS, HTN presents with complaint of left knee pain and swelling without known inciting injury. She has had remote injury to the knee requiring periodic cortisone injections, followed by Dr. Paralee Cancel. No recent visits. No redness, or fever.    Past Medical History  Diagnosis Date  . MS (multiple sclerosis) ([place])   . Hypertension   . Anxiety   . Depression    Past Surgical History  Procedure Laterality Date  . Bilateral hip arthroscopy Left 07/2013  . Joint replacement Bilateral      R 2004 and L 2005   Family History  Problem Relation Age of Onset  . Breast cancer      paternal grandmother dx age 20  . Colon cancer      paternal grandmother   Social History  Substance Use Topics  . Smoking status: Never Smoker   . Smokeless tobacco: Never Used  . Alcohol Use: No   OB History    Gravida Para Term Preterm AB TAB SAB Ectopic Multiple Living   1 1 1       1      Review of Systems  Constitutional: Negative for fever and chills.  Gastrointestinal: Negative.  Negative for nausea.  Musculoskeletal:       See HPI.  Skin: Negative.  Negative for color change.  Neurological: Negative.  Negative for numbness.      Allergies  Review of patient's allergies indicates no known allergies.  Home Medications   Prior to Admission medications   Medication Sig Start Date End Date Taking?  Authorizing Provider  baclofen (LIORESAL) 20 MG tablet Take 20 mg by mouth 3 (three) times daily as needed (for ms).    Yes Historical Provider, MD  Dimethyl Fumarate (TECFIDERA) 240 MG CPDR Take 240 mg by mouth 2 (two) times daily.   Yes Historical Provider, MD  gabapentin (NEURONTIN) 800 MG tablet Take 800 mg by mouth 3 (three) times daily.   Yes Historical Provider, MD  lisinopril (PRINIVIL,ZESTRIL) 20 MG tablet Take 20 mg by mouth daily.   Yes Historical Provider, MD  omeprazole (PRILOSEC) 20 MG capsule Take 20 mg by mouth daily as needed (heart burn).  07/10/13  Yes Historical Provider, MD  oxybutynin (DITROPAN) 5 MG tablet Take 5 mg by mouth at bedtime.  07/10/13  Yes Historical Provider, MD  tiZANidine (ZANAFLEX) 4 MG tablet Take 4 mg by mouth at bedtime.  07/07/13  Yes Historical Provider, MD  citalopram (CELEXA) 20 MG tablet Take 1 tablet (20 mg total) by mouth daily. NO MORE REFILLS WITHOUT OFFICE VISIT - 2ND NOTICE Patient not taking: Reported on 04/13/2015 11/05/13   Mancel Bale, PA-C  diclofenac (VOLTAREN) 75 MG EC tablet Take 1 tablet (75 mg total) by mouth 2 (two) times daily. Patient not taking: Reported on 01/27/2015 09/24/14   Waldemar Dickens, MD  doxycycline (VIBRAMYCIN) 100 MG  capsule Take 1 capsule (100 mg total) by mouth 2 (two) times daily. Patient not taking: Reported on 04/13/2015 03/16/15   Billy Fischer, MD  hydrocortisone 2.5 % cream Apply topically 2 (two) times daily. Patient not taking: Reported on 01/27/2015 05/20/14   [name], MD  ipratropium (ATROVENT) 0.06 % nasal spray Place 2 sprays into both nostrils 4 (four) times daily. Patient not taking: Reported on 04/13/2015 03/16/15   Billy Fischer, MD  LORazepam (ATIVAN) 1 MG tablet Take 1 tablet (1 mg total) by mouth 2 (two) times daily as needed for anxiety. Patient not taking: Reported on 01/27/2015 03/10/14   [name], MD  methocarbamol (ROBAXIN) 500 MG tablet Take 1-2 tablets (500-1,000 mg total) by mouth every 6 (six)  hours as needed for muscle spasms. Patient not taking: Reported on 01/27/2015 09/24/14   Waldemar Dickens, MD  metroNIDAZOLE (FLAGYL) 500 MG tablet Take 1 tablet (500 mg total) by mouth 2 (two) times daily. Patient not taking: Reported on 01/27/2015 11/25/14   Lavonia Drafts, MD  norethindrone (CAMILA) 0.35 MG tablet Take 1 tablet (0.35 mg total) by mouth daily. Patient not taking: Reported on 01/27/2015 11/01/13 11/01/14  Nino Glow McLean-Scocozza, MD  Norgestimate-Ethinyl Estradiol Triphasic 0.18/0.215/0.25 MG-25 MCG tab Take 1 tablet by mouth daily. Patient not taking: Reported on 01/27/2015 12/18/14   Woodroe Mode, MD  terbinafine (LAMISIL) 250 MG tablet Take 1 tablet (250 mg total) by mouth daily. Patient not taking: Reported on 01/27/2015 07/03/14   [name], MD  traMADol (ULTRAM) 50 MG tablet Take 1 tablet (50 mg total) by mouth every 6 (six) hours as needed. Patient not taking: Reported on 01/27/2015 06/05/14   Melony Overly, MD  traMADol (ULTRAM) 50 MG tablet Take 1 tablet (50 mg total) by mouth every 6 (six) hours as needed. Patient not taking: Reported on 01/27/2015 08/07/14   Baron Sane, PA-C  Vitamin D, Ergocalciferol, (DRISDOL) 50000 UNITS CAPS capsule Take 1 capsule (50,000 Units total) by mouth every 7 (seven) days. Patient not taking: Reported on 01/27/2015 03/10/14   [name], MD   BP 118/86 mmHg  Pulse 82  Temp(Src) 97.8 F (36.6 C) (Oral)  Resp 16  SpO2 100%  LMP 04/12/2015 (Exact Date) Physical Exam  Constitutional: She is oriented to person, place, and time. She appears well-developed and well-nourished.  Neck: Normal range of motion.  Cardiovascular: Intact distal pulses.   Pulmonary/Chest: Effort normal.  Musculoskeletal:  Left knee minimally swollen laterally. No redness or warmth. Joint stable. No calf or thigh tenderness.   Neurological: She is alert and oriented to person, place, and time.  Skin: Skin is warm and dry.    ED Course   Procedures (including critical care time) Labs Review Labs Reviewed - No data to display  Imaging Review Dg Knee Complete 4 Views Left  04/13/2015  CLINICAL DATA:  Acute onset of left lateral knee pain and swelling. Initial encounter. EXAM: LEFT KNEE - COMPLETE 4+ VIEW COMPARISON:  Left knee radiographs performed 08/07/2014 FINDINGS: There is no evidence of fracture or dislocation. The joint spaces are preserved. No significant degenerative change is seen; the patellofemoral joint is grossly unremarkable in appearance. No significant joint effusion is seen. The visualized soft tissues are normal in appearance. IMPRESSION: No evidence of fracture or dislocation. Electronically Signed   By: Garald Balding M.D.   On: 04/13/2015 01:10   I have personally reviewed and evaluated these images and lab results as part of my medical  decision-making.   EKG Interpretation None      MDM   Final diagnoses:  None    1. Left knee pain  Knee immobilizer and crutches provided. Will Rx voltaren gel and ibuprofen with orthopedic f/u recommended.    Charlann Lange, PA-C [id number]  Delora Fuel, MD [id number]

## 2015-04-13 NOTE — Discharge Instructions (Signed)
Cryotherapy °Cryotherapy means treatment with cold. Ice or gel packs can be used to reduce both pain and swelling. Ice is the most helpful within the first 24 to 48 hours after an injury or flare-up from overusing a muscle or joint. Sprains, strains, spasms, burning pain, shooting pain, and aches can all be eased with ice. Ice can also be used when recovering from surgery. Ice is effective, has very few side effects, and is safe for most people to use. °PRECAUTIONS  °Ice is not a safe treatment option for people with: °· Raynaud phenomenon. This is a condition affecting small blood vessels in the extremities. Exposure to cold may cause your problems to return. °· Cold hypersensitivity. There are many forms of cold hypersensitivity, including: °· Cold urticaria. Red, itchy hives appear on the skin when the tissues begin to warm after being iced. °· Cold erythema. This is a red, itchy rash caused by exposure to cold. °· Cold hemoglobinuria. Red blood cells break down when the tissues begin to warm after being iced. The hemoglobin that carry oxygen are passed into the urine because they cannot combine with blood proteins fast enough. °· Numbness or altered sensitivity in the area being iced. °If you have any of the following conditions, do not use ice until you have discussed cryotherapy with your caregiver: °· Heart conditions, such as arrhythmia, angina, or chronic heart disease. °· High blood pressure. °· Healing wounds or open skin in the area being iced. °· Current infections. °· Rheumatoid arthritis. °· Poor circulation. °· Diabetes. °Ice slows the blood flow in the region it is applied. This is beneficial when trying to stop inflamed tissues from spreading irritating chemicals to surrounding tissues. However, if you expose your skin to cold temperatures for too long or without the proper protection, you can damage your skin or nerves. Watch for signs of skin damage due to cold. °HOME CARE INSTRUCTIONS °Follow  these tips to use ice and cold packs safely. °· Place a dry or damp towel between the ice and skin. A damp towel will cool the skin more quickly, so you may need to shorten the time that the ice is used. °· For a more rapid response, add gentle compression to the ice. °· Ice for no more than 10 to 20 minutes at a time. The bonier the area you are icing, the less time it will take to get the benefits of ice. °· Check your skin after 5 minutes to make sure there are no signs of a poor response to cold or skin damage. °· Rest 20 minutes or more between uses. °· Once your skin is numb, you can end your treatment. You can test numbness by very lightly touching your skin. The touch should be so light that you do not see the skin dimple from the pressure of your fingertip. When using ice, most people will feel these normal sensations in this order: cold, burning, aching, and numbness. °· Do not use ice on someone who cannot communicate their responses to pain, such as small children or people with dementia. °HOW TO MAKE AN ICE PACK °Ice packs are the most common way to use ice therapy. Other methods include ice massage, ice baths, and cryosprays. Muscle creams that cause a cold, tingly feeling do not offer the same benefits that ice offers and should not be used as a substitute unless recommended by your caregiver. °To make an ice pack, do one of the following: °· Place crushed ice or a   bag of frozen vegetables in a sealable plastic bag. Squeeze out the excess air. Place this bag inside another plastic bag. Slide the bag into a pillowcase or place a damp towel between your skin and the bag. °· Mix 3 parts water with 1 part rubbing alcohol. Freeze the mixture in a sealable plastic bag. When you remove the mixture from the freezer, it will be slushy. Squeeze out the excess air. Place this bag inside another plastic bag. Slide the bag into a pillowcase or place a damp towel between your skin and the bag. °SEEK MEDICAL CARE  IF: °· You develop white spots on your skin. This may give the skin a blotchy (mottled) appearance. °· Your skin turns blue or pale. °· Your skin becomes waxy or hard. °· Your swelling gets worse. °MAKE SURE YOU:  °· Understand these instructions. °· Will watch your condition. °· Will get help right away if you are not doing well or get worse. °  °This information is not intended to replace advice given to you by your health care provider. Make sure you discuss any questions you have with your health care provider. °  °Document Released: 10/18/2010 Document Revised: 03/14/2014 Document Reviewed: 10/18/2010 °Elsevier Interactive Patient Education ©2016 [employer] ° °Joint Pain °Joint pain, which is also called arthralgia, can be caused by many things. Joint pain often goes away when you follow your health care provider's instructions for relieving pain at home. However, joint pain can also be caused by conditions that require further treatment. Common causes of joint pain include: °· Bruising in the area of the joint. °· Overuse of the joint. °· Wear and tear on the joints that occur with aging (osteoarthritis). °· Various other forms of arthritis. °· A buildup of a crystal form of uric acid in the joint (gout). °· Infections of the joint (septic arthritis) or of the bone (osteomyelitis). °Your health care provider may recommend medicine to help with the pain. If your joint pain continues, additional tests may be needed to diagnose your condition. °HOME CARE INSTRUCTIONS °Watch your condition for any changes. Follow these instructions as directed to lessen the pain that you are feeling. °· Take medicines only as directed by your health care provider. °· Rest the affected area for as long as your health care provider says that you should. If directed to do so, raise the painful joint above the level of your heart while you are sitting or lying down. °· Do not do things that cause or worsen pain. °· If directed,  apply ice to the painful area: °¨ Put ice in a plastic bag. °¨ Place a towel between your skin and the bag. °¨ Leave the ice on for 20 minutes, 2-3 times per day. °· Wear an elastic bandage, splint, or sling as directed by your health care provider. Loosen the elastic bandage or splint if your fingers or toes become numb and tingle, or if they turn cold and blue. °· Begin exercising or stretching the affected area as directed by your health care provider. Ask your health care provider what types of exercise are safe for you. °· Keep all follow-up visits as directed by your health care provider. This is important. °SEEK MEDICAL CARE IF: °· Your pain increases, and medicine does not help. °· Your joint pain does not improve within 3 days. °· You have increased bruising or swelling. °· You have a fever. °· You lose 10 lb (4.5 kg) or more without trying. °SEEK IMMEDIATE   MEDICAL CARE IF: °· You are not able to move the joint. °· Your fingers or toes become numb or they turn cold and blue. °  °This information is not intended to replace advice given to you by your health care provider. Make sure you discuss any questions you have with your health care provider. °  °Document Released: 02/21/2005 Document Revised: 03/14/2014 Document Reviewed: 12/03/2013 °Elsevier Interactive Patient Education ©2016 [employer] ° °

## 2015-04-13 NOTE — ED Notes (Signed)
Pt reports progressive 10/10 left knee pain and swelling. Pt reports hx of cortisone injections for her left knee. Pt denies surgical hx of the left knee. Pt reports hx of possible "torn ligament" in her left knee after "tripping" over an object. Pt A+OX4, speaking in complete sentences. Pt reports taking diflucan and tylenol to treat her pain at home.

## 2015-05-13 ENCOUNTER — Ambulatory Visit (INDEPENDENT_AMBULATORY_CARE_PROVIDER_SITE_OTHER): Payer: Medicare Other | Admitting: Neurology

## 2015-05-13 ENCOUNTER — Encounter: Payer: Self-pay | Admitting: Neurology

## 2015-05-13 ENCOUNTER — Encounter: Payer: Self-pay | Admitting: *Deleted

## 2015-05-13 VITALS — BP 110/68 | HR 86 | Resp 14

## 2015-05-13 DIAGNOSIS — M87052 Idiopathic aseptic necrosis of left femur: Secondary | ICD-10-CM | POA: Diagnosis not present

## 2015-05-13 DIAGNOSIS — G35 Multiple sclerosis: Secondary | ICD-10-CM | POA: Diagnosis not present

## 2015-05-13 DIAGNOSIS — R269 Unspecified abnormalities of gait and mobility: Secondary | ICD-10-CM

## 2015-05-13 DIAGNOSIS — M25562 Pain in left knee: Secondary | ICD-10-CM | POA: Diagnosis not present

## 2015-05-13 DIAGNOSIS — R5383 Other fatigue: Secondary | ICD-10-CM | POA: Diagnosis not present

## 2015-05-13 DIAGNOSIS — F418 Other specified anxiety disorders: Secondary | ICD-10-CM | POA: Diagnosis not present

## 2015-05-13 DIAGNOSIS — M87051 Idiopathic aseptic necrosis of right femur: Secondary | ICD-10-CM

## 2015-05-13 MED ORDER — CITALOPRAM HYDROBROMIDE 20 MG PO TABS: 20.0000 mg | ORAL_TABLET | Freq: Every day | ORAL | Status: DC

## 2015-05-13 MED ORDER — GABAPENTIN 800 MG PO TABS: 800.0000 mg | ORAL_TABLET | Freq: Three times a day (TID) | ORAL | Status: DC

## 2015-05-13 MED ORDER — SUMATRIPTAN SUCCINATE 100 MG PO TABS: 100.0000 mg | ORAL_TABLET | Freq: Once | ORAL | Status: DC | PRN

## 2015-05-13 MED ORDER — DICLOFENAC SODIUM 75 MG PO TBEC: 75.0000 mg | DELAYED_RELEASE_TABLET | Freq: Two times a day (BID) | ORAL | Status: DC

## 2015-05-13 MED ORDER — BACLOFEN 20 MG PO TABS: 20.0000 mg | ORAL_TABLET | Freq: Three times a day (TID) | ORAL | Status: DC | PRN

## 2015-05-13 MED ORDER — LAMOTRIGINE 200 MG PO TABS: 200.0000 mg | ORAL_TABLET | Freq: Two times a day (BID) | ORAL | Status: DC

## 2015-05-13 MED ORDER — LISINOPRIL 20 MG PO TABS: 20.0000 mg | ORAL_TABLET | Freq: Every day | ORAL | Status: DC

## 2015-05-13 MED ORDER — TRAMADOL HCL ER 300 MG PO TB24: 300.0000 mg | ORAL_TABLET | Freq: Every day | ORAL | Status: DC

## 2015-05-13 MED ORDER — TIZANIDINE HCL 4 MG PO TABS: 4.0000 mg | ORAL_TABLET | Freq: Every day | ORAL | Status: DC

## 2015-05-13 NOTE — Progress Notes (Signed)
[facility]  PATIENT: Dawn Peters DOB: February 22, 1981  REFERRING DOCTOR OR PCP:  Dr. Annitta Needs SOURCE: Patient, records in EMR, lab results and radiology reports in EMR, MRI images reviewed on PACS.  _________________________________   HISTORICAL  CHIEF COMPLAINT:  Chief Complaint  Patient presents with  . Multiple Sclerosis    Dawn Peters is here for eval of MS.  Dx. in 2000.  Presenting sx. were migraines, tingling in both hands. wt. loss, difficulty with balance.  Sts. dx. was confirmed with MRI--possibly at [place].  No LP.  She was referred to Dr. Dellis Filbert at [facility].  She started Betaseron for 4-5 yrs. but stopped due to frequent exacerbations involving left leg weakness/gait disturbance.  During the time she took Betaseron, she also had Novantrone infusion once every 3 mos. for about a yr.  She then tried [place] and sts.  . Gait Disturbance    she did well on it for about 2 yrs.--stopped due to positive JCV ab.  She then started Tecfidera in 2015 and sts. has done well on it.  Sts. last MRI was done at [place] in 2015 and she belives it showed no new changes.  She was last followed by Dr. Starleen Blue in [place]. Sts. she would like to transfer care to [facility] because it is closer to  her.  Sts. she is also doesn't feel Dr. Starleen Blue addressed her chronic pain issues.  Sts she has chronic mid and upper back pain, bilat shoulder pain, left   . Back Pain    leg pain./fim    HISTORY OF PRESENT ILLNESS:   I had the pleasure of seeing your patient, Dawn Peters at [facility] for a neurologic consultation regarding her multiple sclerosis.    She is a 35 year old woman who was diagnosed with MS in 2000.  MS history:   She was diagnosed with MS in 2000 after presenting with gait difficulties, numbness and headaches. An MRI of the brain was consistent with MS. She was then started on Betaseron. She had difficulty tolerating Betaseron  and at some point switched to Novantrone. She took Novantrone every 3 months for a year or so. A little later, she was placed on Tysabri and she stayed on it for a couple of years. However, she was JCV-positive and switched off. She did well on Tysabri. She has been on Tecfidera since 2015 and has generally done well with it. Her last MRI from June 2015 showed that there had been no changes compared to an MRI from 2014.  Gait/strength/sensation:    She has some difficulty with her gait due to some weakness in the left leg. She also has pain in that leg near her knee. Eyes the left leg, she does not note weakness elsewhere. She also reports some spasticity in her legs, worse on the left. She does not note any significant symptoms in the arms. She also has a painful tingling pain in the left leg. When she wears certain shoes the pain is worse.  She is currently on gabapentin 800 mg by mouth 3 times a day and lamotrigine 200 mg twice a day for the dysesthetic pain.  For spasticity she is on baclofen and tizanidine  Vision:    She denies any MS related visual problems.  Bladder/bowel:    She denies any bowel issue.   She has urinary frequency and urgency helped by oxybutynin or Vesicare.. She has nocturia once or twice a night.  Fatigue/sleep:   She  denies any major difficulty with fatigue. She sleeps well most nights.  Mood:  She reports mild depression and anxiety and received some benefit from Celexa 20 mg by mouth daily. She is currently out of her medication  Cognition:   She denies any difficulties with her cognitive abilities.  Chronic pain:   She reports pain that is worse in the left leg or she also has pain in both shoulders. Besides gabapentin and lamotrigine, she also takes diclofenac and tramadol 50 mg twice daily.    In the past, she was on oxycontin.     I personally reviewed the MRIs from 08/07/2013. The MRI of the brain shows multiple T2/FLAIR hyperintense foci located in the cerebellum,  middle cerebellar peduncles, pons and in the periventricular white matter of the hemispheres in a pattern and configuration consistent with chronic demyelinating plaque associated with MS. There were no acute findings. There was no change compared to the previous MRI from 01/16/2013 that was also reviewed. An MRI of the cervical spine shows subtle T2 hyperintense signal within the cord and there were no acute findings.  REVIEW OF SYSTEMS: Constitutional: No fevers, chills, sweats, or change in appetite Eyes: No visual changes, double vision, eye pain Ear, nose and throat: No hearing loss, ear pain, nasal congestion, sore throat Cardiovascular: No chest pain, palpitations Respiratory: No shortness of breath at rest or with exertion.   No wheezes GastrointestinaI: No nausea, vomiting, diarrhea, abdominal pain, fecal incontinence Genitourinary: No dysuria, urinary retention or frequency.  No nocturia. Musculoskeletal: No neck pain, back pain Integumentary: No rash, pruritus, skin lesions Neurological: as above Psychiatric: No depression at this time.  No anxiety Endocrine: No palpitations, diaphoresis, change in appetite, change in weigh or increased thirst Hematologic/Lymphatic: No anemia, purpura, petechiae. Allergic/Immunologic: No itchy/runny eyes, nasal congestion, recent allergic reactions, rashes  ALLERGIES: No Known Allergies  HOME MEDICATIONS:  Current outpatient prescriptions:  .  baclofen (LIORESAL) 20 MG tablet, Take 20 mg by mouth 3 (three) times daily as needed (for ms). , Disp: , Rfl:  .  citalopram (CELEXA) 20 MG tablet, Take 1 tablet (20 mg total) by mouth daily. NO MORE REFILLS WITHOUT OFFICE VISIT - 2ND NOTICE, Disp: 15 tablet, Rfl: 0 .  diclofenac (VOLTAREN) 75 MG EC tablet, Take 1 tablet (75 mg total) by mouth 2 (two) times daily., Disp: 60 tablet, Rfl: 0 .  diclofenac sodium (VOLTAREN) 1 % GEL, Apply 4 g topically 4 (four) times daily., Disp: 60 g, Rfl: 0 .   Dimethyl Fumarate (TECFIDERA) 240 MG CPDR, Take 240 mg by mouth 2 (two) times daily., Disp: , Rfl:  .  gabapentin (NEURONTIN) 800 MG tablet, Take 800 mg by mouth 3 (three) times daily., Disp: , Rfl:  .  hydrocortisone 2.5 % cream, Apply topically 2 (two) times daily., Disp: 30 g, Rfl: 0 .  ibuprofen (ADVIL,MOTRIN) 600 MG tablet, Take 1 tablet (600 mg total) by mouth every 6 (six) hours as needed., Disp: 30 tablet, Rfl: 0 .  ipratropium (ATROVENT) 0.06 % nasal spray, Place 2 sprays into both nostrils 4 (four) times daily., Disp: 15 mL, Rfl: 1 .  lamoTRIgine (LAMICTAL) 200 MG tablet, Take 200 mg by mouth 2 (two) times daily., Disp: , Rfl:  .  lisinopril (PRINIVIL,ZESTRIL) 20 MG tablet, Take 20 mg by mouth daily., Disp: , Rfl:  .  LORazepam (ATIVAN) 1 MG tablet, Take 1 tablet (1 mg total) by mouth 2 (two) times daily as needed for anxiety., Disp: 20 tablet, Rfl: 0 .  methocarbamol (ROBAXIN) 500 MG tablet, Take 1-2 tablets (500-1,000 mg total) by mouth every 6 (six) hours as needed for muscle spasms., Disp: 60 tablet, Rfl: 0 .  Norgestimate-Ethinyl Estradiol Triphasic 0.18/0.215/0.25 MG-25 MCG tab, Take 1 tablet by mouth daily., Disp: 1 Package, Rfl: 11 .  omeprazole (PRILOSEC) 20 MG capsule, Take 20 mg by mouth daily as needed (heart burn). , Disp: , Rfl:  .  oxybutynin (DITROPAN) 5 MG tablet, Take 5 mg by mouth at bedtime. , Disp: , Rfl:  .  terbinafine (LAMISIL) 250 MG tablet, Take 1 tablet (250 mg total) by mouth daily., Disp: 30 tablet, Rfl: 1 .  tiZANidine (ZANAFLEX) 4 MG tablet, Take 4 mg by mouth at bedtime. , Disp: , Rfl:  .  traMADol (ULTRAM) 50 MG tablet, Take 1 tablet (50 mg total) by mouth every 6 (six) hours as needed., Disp: 15 tablet, Rfl: 0 .  traMADol (ULTRAM) 50 MG tablet, Take 1 tablet (50 mg total) by mouth every 6 (six) hours as needed., Disp: 15 tablet, Rfl: 0 .  Vitamin D, Ergocalciferol, (DRISDOL) 50000 UNITS CAPS capsule, Take 1 capsule (50,000 Units total) by mouth every 7  (seven) days., Disp: 12 capsule, Rfl: 0 .  norethindrone (CAMILA) 0.35 MG tablet, Take 1 tablet (0.35 mg total) by mouth daily. (Patient not taking: Reported on 01/27/2015), Disp: 1 Package, Rfl: 11 .  [DISCONTINUED] Cetirizine HCl 10 MG CAPS, Take 10 mg q d prn allergies, Disp: 30 capsule, Rfl: 0  PAST MEDICAL HISTORY: Past Medical History  Diagnosis Date  . MS (multiple sclerosis) ([place])   . Hypertension   . Anxiety   . Depression     PAST SURGICAL HISTORY: Past Surgical History  Procedure Laterality Date  . Bilateral hip arthroscopy Left 07/2013  . Joint replacement Bilateral      R 2004 and L 2005    FAMILY HISTORY: Family History  Problem Relation Age of Onset  . Breast cancer      paternal grandmother dx age 9  . Colon cancer      paternal grandmother  . Healthy Mother   . Healthy Father     SOCIAL HISTORY:  Social History   Social History  . Marital Status: Single    Spouse Name: N/A  . Number of Children: N/A  . Years of Education: N/A   Occupational History  . Not on file.   Social History Main Topics  . Smoking status: Never Smoker   . Smokeless tobacco: Never Used  . Alcohol Use: No  . Drug Use: No  . Sexual Activity: Not Currently    Birth Control/ Protection: Pill   Other Topics Concern  . Not on file   Social History Narrative     PHYSICAL EXAM  Filed Vitals:   05/13/15 0900  BP: 110/68  Pulse: 86  Resp: 14    There is no weight on file to calculate BMI.   General: The patient is well-developed and well-nourished and in no acute distress  Eyes:  Funduscopic exam shows normal optic discs and retinal vessels.  Neck: The neck is supple, no carotid bruits are noted.  The neck is nontender.  Cardiovascular: The heart has a regular rate and rhythm with a normal S1 and S2. There were no murmurs, gallops or rubs. Lungs are clear to auscultation.  Skin: Extremities are without significant edema.  Musculoskeletal:  Back is mildly  tender.  Moderately tender left knee  Neurologic Exam  Mental status: The patient  is alert and oriented x 3 at the time of the examination. The patient has apparent normal recent and remote memory, with an apparently normal attention span and concentration ability.   Speech is normal.  Cranial nerves: Extraocular movements are full. Pupils are equal, round, and reactive to light and accomodation.  Visual fields are full.  Facial symmetry is present. There is good facial sensation to soft touch bilaterally.Facial strength is normal.  Trapezius and sternocleidomastoid strength is normal. No dysarthria is noted.  The tongue is midline, and the patient has symmetric elevation of the soft palate. No obvious hearing deficits are noted.  Motor:  Muscle bulk is normal.   Tone is increased on legs, left > right. Strength is  5 / 5 in all 4 extremities.   Sensory: Sensory testing is intact to pinprick, soft touch and vibration sensation in all 4 extremities.  Coordination: Cerebellar testing reveals good finger-nose-finger and heel-to-shin bilaterally.  Gait and station: Station is normal.   Gait is normal. Tandem gait is normal. Romberg is negative.   Reflexes: Deep tendon reflexes are increased in legs, left > right and there is spread at the knees (worse on left) and 2 beats nonsustained clonus at the ankles.      Plantar responses are flexor.    DIAGNOSTIC DATA (LABS, IMAGING, TESTING) - I reviewed patient records, labs, notes, testing and imaging myself where available.  Lab Results  Component Value Date   WBC 3.5* 07/03/2014   HGB 14.6 01/27/2015   HCT 43.0 01/27/2015   MCV 98.2 07/03/2014   PLT 261 07/03/2014      Component Value Date/Time   NA 139 01/27/2015 0451   K 3.5 01/27/2015 0451   CL 100* 01/27/2015 0451   CO2 31 07/03/2014 0959   GLUCOSE 100* 01/27/2015 0451   BUN 19 01/27/2015 0451   CREATININE 0.80 01/27/2015 0451   CREATININE 0.85 07/03/2014 0959   CALCIUM 9.5  07/03/2014 0959   PROT 6.7 07/03/2014 0959   ALBUMIN 4.3 07/03/2014 0959   AST 13 07/03/2014 0959   ALT 9 07/03/2014 0959   ALKPHOS 49 07/03/2014 0959   BILITOT 0.5 07/03/2014 0959   GFRNONAA 88 07/03/2014 0959   GFRNONAA >90 08/04/2013 0950   GFRAA >89 07/03/2014 0959   GFRAA >90 08/04/2013 0950   No results found for: CHOL, HDL, LDLCALC, LDLDIRECT, TRIG, CHOLHDL Lab Results  Component Value Date   HGBA1C 4.7 11/05/2012   No results found for: VITAMINB12 Lab Results  Component Value Date   TSH 1.119 05/31/2013       ASSESSMENT AND PLAN  MULTIPLE SCLEROSIS - Plan: MR Brain W Wo Contrast, MR Cervical Spine Wo Contrast  Gait disturbance - Plan: MR Brain W Wo Contrast, MR Cervical Spine Wo Contrast  Depression with anxiety  Other fatigue  Left knee pain  Avascular necrosis of bones of both hips ([place])    In summary, Dawn Peters is a 35 year old woman with multiple sclerosis and a gait disturbance but also reports a lot of pain. She is currently on Tecfidera and appears to be doing well. I will check an MRI of the brain and the spinal cord to better assess to make sure that there has not been subclinical progression which would make [place] consider higher efficacy medication.   To help with her pain, I will place her on tramadol ER 300 mg she will continue baclofen, lamotrigine, and diclofenac and gabapentin.   We reviewed symptoms of exacerbations. For mood issues,  I will place her back on citalopram; she was off for a while now.  She is advised to exercise as tolerated and stay active.      She is to call if she has any new or worsening neurologic symptoms.   Richard A. Felecia Shelling, MD, PhD [id number], [id number] AM Certified in Neurology, Clinical Neurophysiology, Sleep Medicine, Pain Medicine and Neuroimaging  [facility] [address], [place], [place] [address] [phone]

## 2015-05-15 ENCOUNTER — Emergency Department (HOSPITAL_COMMUNITY)
Admission: EM | Admit: 2015-05-15 | Discharge: 2015-05-15 | Disposition: A | Discharge status: Home / Self Care | Payer: Medicare Other | Attending: Emergency Medicine | Admitting: Emergency Medicine

## 2015-05-15 ENCOUNTER — Emergency Department (HOSPITAL_COMMUNITY): Payer: Medicare Other

## 2015-05-15 ENCOUNTER — Encounter (HOSPITAL_COMMUNITY): Payer: Self-pay | Admitting: Emergency Medicine

## 2015-05-15 DIAGNOSIS — R0789 Other chest pain: Secondary | ICD-10-CM | POA: Diagnosis not present

## 2015-05-15 DIAGNOSIS — F329 Major depressive disorder, single episode, unspecified: Secondary | ICD-10-CM | POA: Diagnosis not present

## 2015-05-15 DIAGNOSIS — G35 Multiple sclerosis: Secondary | ICD-10-CM

## 2015-05-15 DIAGNOSIS — T404X5A Adverse effect of other synthetic narcotics, initial encounter: Secondary | ICD-10-CM | POA: Insufficient documentation

## 2015-05-15 DIAGNOSIS — G8929 Other chronic pain: Secondary | ICD-10-CM | POA: Diagnosis not present

## 2015-05-15 DIAGNOSIS — Z79899 Other long term (current) drug therapy: Secondary | ICD-10-CM | POA: Diagnosis not present

## 2015-05-15 DIAGNOSIS — Z791 Long term (current) use of non-steroidal anti-inflammatories (NSAID): Secondary | ICD-10-CM | POA: Insufficient documentation

## 2015-05-15 DIAGNOSIS — I10 Essential (primary) hypertension: Secondary | ICD-10-CM | POA: Insufficient documentation

## 2015-05-15 DIAGNOSIS — R079 Chest pain, unspecified: Secondary | ICD-10-CM

## 2015-05-15 DIAGNOSIS — R42 Dizziness and giddiness: Secondary | ICD-10-CM | POA: Diagnosis present

## 2015-05-15 DIAGNOSIS — F419 Anxiety disorder, unspecified: Secondary | ICD-10-CM | POA: Diagnosis not present

## 2015-05-15 DIAGNOSIS — T426X5A Adverse effect of other antiepileptic and sedative-hypnotic drugs, initial encounter: Secondary | ICD-10-CM | POA: Diagnosis not present

## 2015-05-15 DIAGNOSIS — T50905A Adverse effect of unspecified drugs, medicaments and biological substances, initial encounter: Secondary | ICD-10-CM

## 2015-05-15 LAB — BASIC METABOLIC PANEL
Anion gap: 13 (ref 5–15)
BUN: 22 mg/dL — ABNORMAL HIGH (ref 6–20)
CO2: 33 mmol/L — AB (ref 22–32)
Calcium: 10.1 mg/dL (ref 8.9–10.3)
Chloride: 96 mmol/L — ABNORMAL LOW (ref 101–111)
Creatinine, Ser: 0.9 mg/dL (ref 0.44–1.00)
GFR calc non Af Amer: >60 mL/min (ref >60)
Glucose, Bld: 103 mg/dL — ABNORMAL HIGH (ref 65–99)
Potassium: 3.3 mmol/L — ABNORMAL LOW (ref 3.5–5.1)
Sodium: 142 mmol/L (ref 135–145)

## 2015-05-15 LAB — CBC
HEMATOCRIT: 45.7 % (ref 36.0–46.0)
HEMOGLOBIN: 15.8 g/dL — AB (ref 12.0–15.0)
MCH: 33.5 pg (ref 26.0–34.0)
MCHC: 34.6 g/dL (ref 30.0–36.0)
MCV: 96.8 fL (ref 78.0–100.0)
Platelets: 261 10*3/uL (ref 150–400)
RBC: 4.72 MIL/uL (ref 3.87–5.11)
RDW: 12.2 % (ref 11.5–15.5)
WBC: 3.9 10*3/uL — ABNORMAL LOW (ref 4.0–10.5)

## 2015-05-15 LAB — I-STAT TROPONIN, ED: Troponin i, poc: 0 ng/mL (ref 0.00–0.08)

## 2015-05-15 NOTE — ED Provider Notes (Signed)
CSN: [id number]     Arrival date & time 05/15/15  [id number] History   First MD Initiated Contact with Patient 05/15/15 1153     Chief Complaint  Patient presents with  . Chest Pain     HPI   35 year old female presents today with numerous complaints. Patient has a history of multiple sclerosis resulting in chronic pain, most recently seen by neurology yesterday. Patient exam is very difficult as patient is unwilling to provide full sentences, and clear answers to my questions. She appears to be somewhat sedated, and annoyed at my line of questioning. Patient's husband reports that she has history MS was seen by neurology yesterday, had very little complaints in addition to her baseline status. He reports that her dose of lamotrigine was increased, she was changed from 50 mg of tramadol to 300 mg of tramadol. He reports that after taking his medications she had numerous complaints. Patient states that she has dizziness, tingling in her fingers, and feels off balance. She notes that this started history afternoon. She additionally notes that she has chest pain. Patient notes the chest pain is located over her central and right anterior chest wall. She reports pain with upper body movements, no pain with deep inspiration, no associated diaphoresis, vomiting, shortness of breath. She reports ambulation does not make chest pain worse, deep inspiration does not make chest pain worse, palpation of the anterior chest wall increases pain. She has no rash, trauma. She denies smoking history, no cardiac history, history of hypertension.  Patient is unable to describe dizziness, and begins to say "yes" to a majority of my questions even if they are not yes no questions. Patient reports this is not made worse with head movements, no history of the same. She reports she had a headache yesterday, no headache at this time my evaluation.  She notes tingling in her hands, originally stated that this is atypical, chart review  shows these were presenting signs and symptoms of her MS diagnosis.  Husband is at bedside reporting that these medications were increased by too much, and symptoms started shortly after taking these medications. Patient denies any drug or alcohol use, fever, chills, abdominal pain, urinary complaints.   Past Medical History  Diagnosis Date  . MS (multiple sclerosis) ([place])   . Hypertension   . Anxiety   . Depression    Past Surgical History  Procedure Laterality Date  . Bilateral hip arthroscopy Left 07/2013  . Joint replacement Bilateral      R 2004 and L 2005   Family History  Problem Relation Age of Onset  . Breast cancer      paternal grandmother dx age 37  . Colon cancer      paternal grandmother  . Healthy Mother   . Healthy Father    Social History  Substance Use Topics  . Smoking status: Never Smoker   . Smokeless tobacco: Never Used  . Alcohol Use: No   OB History    Gravida Para Term Preterm AB TAB SAB Ectopic Multiple Living   1 1 1       1      Review of Systems  All other systems reviewed and are negative.   Allergies  Review of patient's allergies indicates no known allergies.  Home Medications   Prior to Admission medications   Medication Sig Start Date End Date Taking? Authorizing Provider  baclofen (LIORESAL) 20 MG tablet Take 1 tablet (20 mg total) by mouth 3 (three) times daily  as needed (for ms). 05/13/15  Yes Britt Bottom, MD  diclofenac (VOLTAREN) 75 MG EC tablet Take 1 tablet (75 mg total) by mouth 2 (two) times daily. 05/13/15  Yes Britt Bottom, MD  diclofenac sodium (VOLTAREN) 1 % GEL Apply 4 g topically 4 (four) times daily. Patient taking differently: Apply 4 g topically 4 (four) times daily as needed (pain).  04/13/15  Yes Charlann Lange, PA-C  Dimethyl Fumarate (TECFIDERA) 240 MG CPDR Take 240 mg by mouth 2 (two) times daily.   Yes Historical Provider, MD  gabapentin (NEURONTIN) 800 MG tablet Take 1 tablet (800 mg total) by mouth 3  (three) times daily. 05/13/15  Yes Britt Bottom, MD  hydrocortisone 2.5 % cream Apply topically 2 (two) times daily. 05/20/14  Yes [name], MD  lamoTRIgine (LAMICTAL) 200 MG tablet Take 1 tablet (200 mg total) by mouth 2 (two) times daily. 05/13/15  Yes Britt Bottom, MD  lisinopril (PRINIVIL,ZESTRIL) 20 MG tablet Take 1 tablet (20 mg total) by mouth daily. 05/13/15  Yes Britt Bottom, MD  LORazepam (ATIVAN) 1 MG tablet Take 1 tablet (1 mg total) by mouth 2 (two) times daily as needed for anxiety. 03/10/14  Yes [name], MD  methocarbamol (ROBAXIN) 500 MG tablet Take 1-2 tablets (500-1,000 mg total) by mouth every 6 (six) hours as needed for muscle spasms. 09/24/14  Yes Waldemar Dickens, MD  Norgestimate-Ethinyl Estradiol Triphasic 0.18/0.215/0.25 MG-25 MCG tab Take 1 tablet by mouth daily. 12/18/14  Yes Woodroe Mode, MD  omeprazole (PRILOSEC) 20 MG capsule Take 20 mg by mouth daily as needed (heart burn).  07/10/13  Yes Historical Provider, MD  oxybutynin (DITROPAN) 5 MG tablet Take 5 mg by mouth at bedtime.  07/10/13  Yes Historical Provider, MD  traMADol (ULTRAM-ER) 300 MG 24 hr tablet Take 1 tablet (300 mg total) by mouth daily. 05/13/15  Yes Britt Bottom, MD  citalopram (CELEXA) 20 MG tablet Take 1 tablet (20 mg total) by mouth daily. NO MORE REFILLS WITHOUT OFFICE VISIT - 2ND NOTICE Patient not taking: Reported on 05/15/2015 05/13/15   Britt Bottom, MD  ibuprofen (ADVIL,MOTRIN) 600 MG tablet Take 1 tablet (600 mg total) by mouth every 6 (six) hours as needed. Patient not taking: Reported on 05/15/2015 04/13/15   Charlann Lange, PA-C  ipratropium (ATROVENT) 0.06 % nasal spray Place 2 sprays into both nostrils 4 (four) times daily. Patient not taking: Reported on 05/15/2015 03/16/15   Billy Fischer, MD  SUMAtriptan (IMITREX) 100 MG tablet Take 1 tablet (100 mg total) by mouth once as needed for migraine. Patient not taking: Reported on 05/15/2015 05/13/15   Britt Bottom, MD  tiZANidine  (ZANAFLEX) 4 MG tablet Take 1 tablet (4 mg total) by mouth at bedtime. Patient not taking: Reported on 05/15/2015 05/13/15   Britt Bottom, MD  Vitamin D, Ergocalciferol, (DRISDOL) 50000 UNITS CAPS capsule Take 1 capsule (50,000 Units total) by mouth every 7 (seven) days. Patient not taking: Reported on 05/15/2015 03/10/14   [name], MD   BP 127/92 mmHg  Pulse 108  Temp(Src) 98.4 F (36.9 C) (Oral)  Resp 17  SpO2 99%  LMP 05/12/2015   Physical Exam  Constitutional: She is oriented to person, place, and time. She appears well-developed and well-nourished.  Mildly sedated, no acute distress  HENT:  Head: Normocephalic and atraumatic.  Eyes: Conjunctivae are normal. Pupils are equal, round, and reactive to light. Right eye exhibits no discharge. Left  eye exhibits no discharge. No scleral icterus.  Neck: Normal range of motion. No JVD present. No tracheal deviation present.  Cardiovascular: Regular rhythm, normal heart sounds and intact distal pulses.   Pulmonary/Chest: Effort normal and breath sounds normal. No stridor. No respiratory distress. She has no wheezes. She has no rales. She exhibits no tenderness.  TTP to right anterior chest wall. Radial pulses equal bilateral   Musculoskeletal:  No lower extremity swelling or edema  Neurological: She is alert and oriented to person, place, and time. Coordination normal.  Left-sided lower extremity weakness, this is baseline for patient  Skin: Skin is warm and dry. No rash noted. No erythema.  Psychiatric: She has a normal mood and affect. Her behavior is normal. Judgment and thought content normal.  Nursing note and vitals reviewed.   ED Course  Procedures (including critical care time) Labs Review Labs Reviewed  BASIC METABOLIC PANEL - Abnormal; Notable for the following:    Potassium 3.3 (*)    Chloride 96 (*)    CO2 33 (*)    Glucose, Bld 103 (*)    BUN 22 (*)    All other components within normal limits  CBC - Abnormal;  Notable for the following:    WBC 3.9 (*)    Hemoglobin 15.8 (*)    All other components within normal limits  I-STAT TROPOININ, ED    Imaging Review Dg Chest 2 View  05/15/2015  CLINICAL DATA:  Mid chest pain for 2-3 days EXAM: CHEST  2 VIEW COMPARISON:  01/01/2006 FINDINGS: The heart size and mediastinal contours are within normal limits. Both lungs are clear. The visualized skeletal structures are unremarkable. IMPRESSION: No active cardiopulmonary disease. Electronically Signed   By: Skipper Cliche M.D.   On: 05/15/2015 10:22   I have personally reviewed and evaluated these images and lab results as part of my medical decision-making.   EKG Interpretation None      MDM   Final diagnoses:  MS (multiple sclerosis) ([facility])  Medication side effect, initial encounter  Chest pain, unspecified chest pain type    Labs: Troponin, BMP, CBC- no significant findings  Imaging:  Consults:  Therapeutics:  Discharge Meds:   Assessment/Plan: 35 year old female presents today with numerous complaints. Patient appears well sedated, and annoyed at my questioning. Patient's presentation is most consistent with medication change. She had recent increase in Lamictal and Ultram which could account for a majority of her symptoms. Her chest pain is likely chest wall, she has tenderness to palpation of the chest, this is not associated with any other ACS-type symptoms. Patient has heart score of 1. Patients are not consistent with dissection, or pulmonary embolism. Remaining of patient's symptoms consistent with previous MS diagnosis. Patient will be instructed to contact neurology inform them of today's visit. She is instructed to avoid taking tramadol until she returns to her baseline status. Patient has a scheduled MRI coming up to further assess ALS. Patient was made aware of potassium and encouraged to increase oral potassium via bananas.; This is unlikely to be of any significance today.  Patient  is instructed to follow-up with her primary care provider on Monday for reevaluation and further management. She is given strict return precautions, both patient and the husband understand and verbalize agreement today's plan had no further questions or concerns at the time of discharge.        Okey Regal, PA-C 05/15/15 [place], MD 05/17/15 2135

## 2015-05-15 NOTE — ED Notes (Addendum)
Pt reports midline CP, back pain, dizziness and bilateral arm tingling for the past 3 days. No cough. Threw up 1x yesterday, but none today.

## 2015-05-15 NOTE — Discharge Instructions (Signed)

## 2015-05-18 ENCOUNTER — Encounter: Payer: Self-pay | Admitting: *Deleted

## 2015-05-19 ENCOUNTER — Other Ambulatory Visit: Payer: Self-pay | Admitting: *Deleted

## 2015-05-19 ENCOUNTER — Telehealth: Payer: Self-pay | Admitting: Neurology

## 2015-05-19 ENCOUNTER — Encounter: Payer: Self-pay | Admitting: Neurology

## 2015-05-19 ENCOUNTER — Ambulatory Visit (INDEPENDENT_AMBULATORY_CARE_PROVIDER_SITE_OTHER): Payer: Self-pay | Admitting: Neurology

## 2015-05-19 DIAGNOSIS — R200 Anesthesia of skin: Secondary | ICD-10-CM | POA: Insufficient documentation

## 2015-05-19 DIAGNOSIS — Z0289 Encounter for other administrative examinations: Secondary | ICD-10-CM

## 2015-05-19 DIAGNOSIS — R29898 Other symptoms and signs involving the musculoskeletal system: Secondary | ICD-10-CM

## 2015-05-19 DIAGNOSIS — G35 Multiple sclerosis: Secondary | ICD-10-CM

## 2015-05-19 DIAGNOSIS — R269 Unspecified abnormalities of gait and mobility: Secondary | ICD-10-CM

## 2015-05-19 NOTE — Telephone Encounter (Signed)
LMTC./fim 

## 2015-05-19 NOTE — Telephone Encounter (Signed)
I spoke with Dawn Peters this morning.  She came in this afternoon for SoluMedrol 1gram IV and [facility] saw her as well/fim

## 2015-05-19 NOTE — Progress Notes (Signed)
[facility]  PATIENT: Dawn Peters DOB: [birth year]  REFERRING DOCTOR OR PCP:  Dr. Annitta Needs SOURCE: Patient, records in EMR, lab results and radiology reports in EMR, MRI images reviewed on PACS.  _________________________________   HISTORICAL  CHIEF COMPLAINT:  No chief complaint on file.   HISTORY OF PRESENT ILLNESS:   Dawn Peters is a 35 year old woman who was diagnosed with MS in 2000.     Gait/strength/sensation:  Since yesterday, she has had left greater than right leg numbness, weakness and clumsiness.  Gait is poor and she needs support.  Vision:    She denies any MS related visual problems.  Bladder/bowel:    She denies any bowel issue.   She has urinary frequency and urgency helped by oxybutynin or Vesicare.. She has nocturia once or twice a night.  Cognition:   She denies any difficulties with her cognitive abilities.  MS history:   She was diagnosed with MS in 2000 after presenting with gait difficulties, numbness and headaches. An MRI of the brain was consistent with MS. She was then started on Betaseron. She had difficulty tolerating Betaseron and at some point switched to Novantrone. She took Novantrone every 3 months for a year or so. A little later, she was placed on Tysabri and she stayed on it for a couple of years. However, she was JCV-positive and switched off. She did well on Tysabri. She has been on Tecfidera since 2015 and has generally done well with it. Her last MRI from June 2015 showed that there had been no changes compared to an MRI from 2014.  Data:  MRIs from 08/07/2013. The MRI of the brain shows multiple T2/FLAIR hyperintense foci located in the cerebellum, middle cerebellar peduncles, pons and in the periventricular white matter of the hemispheres in a pattern and configuration consistent with chronic demyelinating plaque associated with MS. There were no acute findings. There was no change compared to the previous MRI  from 01/16/2013 that was also reviewed. An MRI of the cervical spine shows subtle T2 hyperintense signal within the cord and there were no acute findings.  REVIEW OF SYSTEMS: Constitutional: No fevers, chills, sweats, or change in appetite Eyes: No visual changes, double vision, eye pain Ear, nose and throat: No hearing loss, ear pain, nasal congestion, sore throat Cardiovascular: No chest pain, palpitations Respiratory: No shortness of breath at rest or with exertion.   No wheezes GastrointestinaI: No nausea, vomiting, diarrhea, abdominal pain, fecal incontinence Genitourinary: No dysuria, urinary retention or frequency.  No nocturia. Musculoskeletal: No neck pain, back pain Integumentary: No rash, pruritus, skin lesions Neurological: as above Psychiatric: No depression at this time.  No anxiety Endocrine: No palpitations, diaphoresis, change in appetite, change in weigh or increased thirst Hematologic/Lymphatic: No anemia, purpura, petechiae. Allergic/Immunologic: No itchy/runny eyes, nasal congestion, recent allergic reactions, rashes  ALLERGIES: No Known Allergies  HOME MEDICATIONS:  Current outpatient prescriptions:  .  baclofen (LIORESAL) 20 MG tablet, Take 1 tablet (20 mg total) by mouth 3 (three) times daily as needed (for ms)., Disp: 90 each, Rfl: 11 .  citalopram (CELEXA) 20 MG tablet, Take 1 tablet (20 mg total) by mouth daily. NO MORE REFILLS WITHOUT OFFICE VISIT - 2ND NOTICE (Patient not taking: Reported on 05/15/2015), Disp: 30 tablet, Rfl: 11 .  diclofenac (VOLTAREN) 75 MG EC tablet, Take 1 tablet (75 mg total) by mouth 2 (two) times daily., Disp: 60 tablet, Rfl: 0 .  diclofenac sodium (VOLTAREN) 1 % GEL, Apply 4 g topically  4 (four) times daily. (Patient taking differently: Apply 4 g topically 4 (four) times daily as needed (pain). ), Disp: 60 g, Rfl: 0 .  Dimethyl Fumarate (TECFIDERA) 240 MG CPDR, Take 240 mg by mouth 2 (two) times daily., Disp: , Rfl:  .  gabapentin  (NEURONTIN) 800 MG tablet, Take 1 tablet (800 mg total) by mouth 3 (three) times daily., Disp: 90 tablet, Rfl: 11 .  hydrocortisone 2.5 % cream, Apply topically 2 (two) times daily., Disp: 30 g, Rfl: 0 .  ibuprofen (ADVIL,MOTRIN) 600 MG tablet, Take 1 tablet (600 mg total) by mouth every 6 (six) hours as needed. (Patient not taking: Reported on 05/15/2015), Disp: 30 tablet, Rfl: 0 .  ipratropium (ATROVENT) 0.06 % nasal spray, Place 2 sprays into both nostrils 4 (four) times daily. (Patient not taking: Reported on 05/15/2015), Disp: 15 mL, Rfl: 1 .  lamoTRIgine (LAMICTAL) 200 MG tablet, Take 1 tablet (200 mg total) by mouth 2 (two) times daily., Disp: 60 tablet, Rfl: 11 .  lisinopril (PRINIVIL,ZESTRIL) 20 MG tablet, Take 1 tablet (20 mg total) by mouth daily., Disp: 30 tablet, Rfl: 11 .  LORazepam (ATIVAN) 1 MG tablet, Take 1 tablet (1 mg total) by mouth 2 (two) times daily as needed for anxiety., Disp: 20 tablet, Rfl: 0 .  methocarbamol (ROBAXIN) 500 MG tablet, Take 1-2 tablets (500-1,000 mg total) by mouth every 6 (six) hours as needed for muscle spasms., Disp: 60 tablet, Rfl: 0 .  Norgestimate-Ethinyl Estradiol Triphasic 0.18/0.215/0.25 MG-25 MCG tab, Take 1 tablet by mouth daily., Disp: 1 Package, Rfl: 11 .  omeprazole (PRILOSEC) 20 MG capsule, Take 20 mg by mouth daily as needed (heart burn). , Disp: , Rfl:  .  oxybutynin (DITROPAN) 5 MG tablet, Take 5 mg by mouth at bedtime. , Disp: , Rfl:  .  SUMAtriptan (IMITREX) 100 MG tablet, Take 1 tablet (100 mg total) by mouth once as needed for migraine. (Patient not taking: Reported on 05/15/2015), Disp: 10 tablet, Rfl: 2 .  tiZANidine (ZANAFLEX) 4 MG tablet, Take 1 tablet (4 mg total) by mouth at bedtime. (Patient not taking: Reported on 05/15/2015), Disp: 30 tablet, Rfl: 11 .  traMADol (ULTRAM-ER) 300 MG 24 hr tablet, Take 1 tablet (300 mg total) by mouth daily., Disp: 30 tablet, Rfl: 3 .  Vitamin D, Ergocalciferol, (DRISDOL) 50000 UNITS CAPS capsule, Take  1 capsule (50,000 Units total) by mouth every 7 (seven) days. (Patient not taking: Reported on 05/15/2015), Disp: 12 capsule, Rfl: 0 .  [DISCONTINUED] Cetirizine HCl 10 MG CAPS, Take 10 mg q d prn allergies, Disp: 30 capsule, Rfl: 0  PAST MEDICAL HISTORY: Past Medical History  Diagnosis Date  . MS (multiple sclerosis) ([place])   . Hypertension   . Anxiety   . Depression     PAST SURGICAL HISTORY: Past Surgical History  Procedure Laterality Date  . Bilateral hip arthroscopy Left 07/2013  . Joint replacement Bilateral      R 2004 and L 2005    FAMILY HISTORY: Family History  Problem Relation Age of Onset  . Breast cancer      paternal grandmother dx age 85  . Colon cancer      paternal grandmother  . Healthy Mother   . Healthy Father     SOCIAL HISTORY:  Social History   Social History  . Marital Status: Single    Spouse Name: N/A  . Number of Children: N/A  . Years of Education: N/A   Occupational History  .  Not on file.   Social History Main Topics  . Smoking status: Never Smoker   . Smokeless tobacco: Never Used  . Alcohol Use: No  . Drug Use: No  . Sexual Activity: Not Currently    Birth Control/ Protection: Pill   Other Topics Concern  . Not on file   Social History Narrative     PHYSICAL EXAM  There were no vitals filed for this visit.  There is no weight on file to calculate BMI.   General: The patient is well-developed and well-nourished and in no acute distress  Neurologic Exam  Mental status: The patient is alert and oriented x 3 at the time of the examination. The patient has apparent normal recent and remote memory, with an apparently normal attention span and concentration ability.   Speech is normal.  Cranial nerves: Extraocular movements are full.  There is good facial sensation to soft touch bilaterally.Facial strength is normal.  Trapezius and sternocleidomastoid strength is normal. No dysarthria is noted.  The tongue is midline, and  the patient has symmetric elevation of the soft palate. No obvious hearing deficits are noted.  Motor:  Muscle bulk is normal.   Tone is increased on legs, left > right. Strength is  5 / 5 in all 4 extremities.   Sensory: Sensory testing shows reduced touch in left leg.    Coordination: Cerebellar testing reveals good finger-nose-finger and poor heel-to-shin bilaterally.  Gait and station: Station requires unilat supportnormal.   Gait is poor  Romberg is positive.   Reflexes: Deep tendon reflexes are increased in legs, left > right and there is spread at the knees (worse on left) and 2 beats nonsustained clonus at the ankles.          DIAGNOSTIC DATA (LABS, IMAGING, TESTING) - I reviewed patient records, labs, notes, testing and imaging myself where available.  Lab Results  Component Value Date   WBC 3.9* 05/15/2015   HGB 15.8* 05/15/2015   HCT 45.7 05/15/2015   MCV 96.8 05/15/2015   PLT 261 05/15/2015      Component Value Date/Time   NA 142 05/15/2015 1005   K 3.3* 05/15/2015 1005   CL 96* 05/15/2015 1005   CO2 33* 05/15/2015 1005   GLUCOSE 103* 05/15/2015 1005   BUN 22* 05/15/2015 1005   CREATININE 0.90 05/15/2015 1005   CREATININE 0.85 07/03/2014 0959   CALCIUM 10.1 05/15/2015 1005   PROT 6.7 07/03/2014 0959   ALBUMIN 4.3 07/03/2014 0959   AST 13 07/03/2014 0959   ALT 9 07/03/2014 0959   ALKPHOS 49 07/03/2014 0959   BILITOT 0.5 07/03/2014 0959   GFRNONAA >60 05/15/2015 1005   GFRNONAA 88 07/03/2014 0959   GFRAA >60 05/15/2015 1005   GFRAA >89 07/03/2014 0959   No results found for: CHOL, HDL, LDLCALC, LDLDIRECT, TRIG, CHOLHDL Lab Results  Component Value Date   HGBA1C 4.7 11/05/2012   No results found for: VITAMINB12 Lab Results  Component Value Date   TSH 1.119 05/31/2013       ASSESSMENT AND PLAN  MULTIPLE SCLEROSIS  Gait disturbance  Numbness  1.    She appears to be having a severe MS exacerbation.    IV Solu-Medrol 1000 mg x 3 d 2.   MRI  brain and cervical spine. 3.    Consider change in therapy 4.   rtc as needed prn new or worsening neurologic symptoms.   Chaquana Nichols A. Felecia Shelling, MD, PhD [id number], [id number] PM Certified in Neurology, Clinical  Neurophysiology, Sleep Medicine, Pain Medicine and Neuroimaging  [facility] [address], [place], [place] [address] [phone]

## 2015-05-19 NOTE — Telephone Encounter (Signed)
Pt called sts she's had a relapse. Pt said she fell this morning, her left leg gave away.She fell on the hardwood floor hitting her lip and left shoulder. She could not get up,her boyfriend had to pick her up. She said her vision became blurry for about 2-3 minutes after the fall. Please call

## 2015-05-20 ENCOUNTER — Other Ambulatory Visit: Payer: Self-pay | Admitting: Neurology

## 2015-05-20 ENCOUNTER — Encounter: Payer: Self-pay | Admitting: Neurology

## 2015-05-20 ENCOUNTER — Encounter: Payer: Self-pay | Admitting: *Deleted

## 2015-05-20 MED ORDER — DIAZEPAM 5 MG PO TABS: ORAL_TABLET | ORAL | Status: DC

## 2015-05-21 ENCOUNTER — Other Ambulatory Visit: Payer: Self-pay | Admitting: Neurology

## 2015-05-21 MED ORDER — METHYLPREDNISOLONE 4 MG PO TABS: ORAL_TABLET | ORAL | Status: DC

## 2015-05-28 ENCOUNTER — Other Ambulatory Visit: Payer: Self-pay | Admitting: Neurology

## 2015-05-29 ENCOUNTER — Inpatient Hospital Stay: Admission: RE | Admit: 2015-05-29 | Payer: Medicare Other | Source: Ambulatory Visit

## 2015-06-01 DIAGNOSIS — F329 Major depressive disorder, single episode, unspecified: Secondary | ICD-10-CM | POA: Insufficient documentation

## 2015-06-01 DIAGNOSIS — F419 Anxiety disorder, unspecified: Secondary | ICD-10-CM | POA: Insufficient documentation

## 2015-06-01 DIAGNOSIS — G35 Multiple sclerosis: Secondary | ICD-10-CM | POA: Insufficient documentation

## 2015-06-01 DIAGNOSIS — I10 Essential (primary) hypertension: Secondary | ICD-10-CM | POA: Insufficient documentation

## 2015-06-01 DIAGNOSIS — Z79818 Long term (current) use of other agents affecting estrogen receptors and estrogen levels: Secondary | ICD-10-CM | POA: Insufficient documentation

## 2015-06-01 DIAGNOSIS — Z79899 Other long term (current) drug therapy: Secondary | ICD-10-CM | POA: Diagnosis not present

## 2015-06-01 DIAGNOSIS — R531 Weakness: Secondary | ICD-10-CM | POA: Diagnosis present

## 2015-06-02 ENCOUNTER — Emergency Department (HOSPITAL_COMMUNITY)
Admission: EM | Admit: 2015-06-02 | Discharge: 2015-06-02 | Disposition: A | Discharge status: Home / Self Care | Payer: Medicare Other | Attending: Emergency Medicine | Admitting: Emergency Medicine

## 2015-06-02 ENCOUNTER — Other Ambulatory Visit: Payer: Self-pay | Admitting: Neurology

## 2015-06-02 ENCOUNTER — Encounter (HOSPITAL_COMMUNITY): Payer: Self-pay | Admitting: Emergency Medicine

## 2015-06-02 DIAGNOSIS — G35 Multiple sclerosis: Secondary | ICD-10-CM | POA: Diagnosis not present

## 2015-06-02 DIAGNOSIS — R200 Anesthesia of skin: Secondary | ICD-10-CM

## 2015-06-02 LAB — CBC WITH DIFFERENTIAL/PLATELET
BASOS ABS: 0 10*3/uL (ref 0.0–0.1)
Basophils Relative: 0 %
Eosinophils Absolute: 0 10*3/uL (ref 0.0–0.7)
Eosinophils Relative: 0 %
HEMATOCRIT: 37.8 % (ref 36.0–46.0)
HEMOGLOBIN: 13.2 g/dL (ref 12.0–15.0)
LYMPHS ABS: 0.6 10*3/uL — AB (ref 0.7–4.0)
LYMPHS PCT: 8 %
MCH: 33.2 pg (ref 26.0–34.0)
MCHC: 34.9 g/dL (ref 30.0–36.0)
MCV: 95 fL (ref 78.0–100.0)
Monocytes Absolute: 0.7 10*3/uL (ref 0.1–1.0)
Monocytes Relative: 10 %
NEUTROS ABS: 6 10*3/uL (ref 1.7–7.7)
NEUTROS PCT: 82 %
PLATELETS: 255 10*3/uL (ref 150–400)
RBC: 3.98 MIL/uL (ref 3.87–5.11)
RDW: 13.2 % (ref 11.5–15.5)
WBC: 7.3 10*3/uL (ref 4.0–10.5)

## 2015-06-02 LAB — I-STAT CHEM 8, ED
BUN: 16 mg/dL (ref 6–20)
CALCIUM ION: 1.21 mmol/L (ref 1.12–1.23)
CHLORIDE: 102 mmol/L (ref 101–111)
Creatinine, Ser: 0.9 mg/dL (ref 0.44–1.00)
GLUCOSE: 103 mg/dL — AB (ref 65–99)
HCT: 40 % (ref 36.0–46.0)
Hemoglobin: 13.6 g/dL (ref 12.0–15.0)
POTASSIUM: 3.6 mmol/L (ref 3.5–5.1)
Sodium: 140 mmol/L (ref 135–145)
TCO2: 29 mmol/L (ref 0–100)

## 2015-06-02 MED ORDER — METHYLPREDNISOLONE SODIUM SUCC 125 MG IJ SOLR
125.0000 mg | Freq: Once | INTRAMUSCULAR | Status: AC
  Administered 2015-06-02: 125 mg via INTRAVENOUS
  Filled 2015-06-02: qty 2

## 2015-06-02 MED ORDER — ONDANSETRON 8 MG PO TBDP
8.0000 mg | ORAL_TABLET | Freq: Once | ORAL | Status: AC
  Administered 2015-06-02: 8 mg via ORAL
  Filled 2015-06-02: qty 1

## 2015-06-02 MED ORDER — OXYCODONE-ACETAMINOPHEN 5-325 MG PO TABS
1.0000 | ORAL_TABLET | Freq: Once | ORAL | Status: AC
  Administered 2015-06-02: 1 via ORAL
  Filled 2015-06-02: qty 1

## 2015-06-02 MED ORDER — OXYCODONE-ACETAMINOPHEN 5-325 MG PO TABS: 1.0000 | ORAL_TABLET | Freq: Four times a day (QID) | ORAL | Status: DC | PRN

## 2015-06-02 NOTE — Discharge Instructions (Signed)
They were given the first dose of Solu-Medrol IV.  For your multiple sclerosis exacerbation.  Please follow-up with your neurologist today by phone and he will decide if he needs another injection today or wait until Wednesday

## 2015-06-02 NOTE — ED Notes (Addendum)
Pt c/o L leg pain and weakness, HA, +nausea. Pt feels this is another exacerbation of her MS, pt states she experienced a sever exacerbation with 3 days of IV steroids, pt states she was unable ambulate. Last PO prednisone yesterday.

## 2015-06-02 NOTE — ED Notes (Signed)
Pt had episode of emesis in triage room. Pt medicated for pain and nausea

## 2015-06-02 NOTE — ED Provider Notes (Signed)
CSN: [id number]     Arrival date & time 06/01/15  2329 History   First MD Initiated Contact with Patient 06/02/15 0211     Chief Complaint  Patient presents with  . MS exacerbation      (Consider location/radiation/quality/duration/timing/severity/associated sxs/prior Treatment) HPI Comments: This 35 year old female with a history of multiple sclerosis who recently had an exacerbation on March 14.  She received 3 days of IV steroids and is currently finishing a steroid taper, but states to date.  She has had difficulty walking, weakness in her leg and frequent falling, which is typical of her multiple sclerosis exacerbations.  The history is provided by the patient.    Past Medical History  Diagnosis Date  . MS (multiple sclerosis) ([place])   . Hypertension   . Anxiety   . Depression    Past Surgical History  Procedure Laterality Date  . Bilateral hip arthroscopy Left 07/2013  . Joint replacement Bilateral      R 2004 and L 2005   Family History  Problem Relation Age of Onset  . Breast cancer      paternal grandmother dx age 82  . Colon cancer      paternal grandmother  . Healthy Mother   . Healthy Father    Social History  Substance Use Topics  . Smoking status: Never Smoker   . Smokeless tobacco: Never Used  . Alcohol Use: No   OB History    Gravida Para Term Preterm AB TAB SAB Ectopic Multiple Living   1 1 1       1      Review of Systems  Constitutional: Negative for fever.  Respiratory: Negative for shortness of breath.   Musculoskeletal: Positive for back pain and arthralgias.  Neurological: Positive for headaches.  All other systems reviewed and are negative.     Allergies  Review of patient's allergies indicates no known allergies.  Home Medications   Prior to Admission medications   Medication Sig Start Date End Date Taking? Authorizing Provider  baclofen (LIORESAL) 20 MG tablet Take 1 tablet (20 mg total) by mouth 3 (three) times daily as needed  (for ms). 05/13/15  Yes Britt Bottom, MD  diclofenac sodium (VOLTAREN) 1 % GEL Apply 4 g topically 4 (four) times daily. Patient taking differently: Apply 4 g topically 4 (four) times daily as needed (pain).  04/13/15  Yes Charlann Lange, PA-C  Dimethyl Fumarate (TECFIDERA) 240 MG CPDR Take 240 mg by mouth 2 (two) times daily.   Yes Historical Provider, MD  gabapentin (NEURONTIN) 800 MG tablet Take 1 tablet (800 mg total) by mouth 3 (three) times daily. 05/13/15  Yes Britt Bottom, MD  lamoTRIgine (LAMICTAL) 200 MG tablet Take 1 tablet (200 mg total) by mouth 2 (two) times daily. 05/13/15  Yes Britt Bottom, MD  lisinopril (PRINIVIL,ZESTRIL) 20 MG tablet Take 1 tablet (20 mg total) by mouth daily. 05/13/15  Yes Britt Bottom, MD  Norgestimate-Ethinyl Estradiol Triphasic 0.18/0.215/0.25 MG-25 MCG tab Take 1 tablet by mouth daily. 12/18/14  Yes Woodroe Mode, MD  omeprazole (PRILOSEC) 20 MG capsule Take 20 mg by mouth daily as needed (heart burn).  07/10/13  Yes Historical Provider, MD  SUMAtriptan (IMITREX) 100 MG tablet Take 1 tablet (100 mg total) by mouth once as needed for migraine. 05/13/15  Yes Britt Bottom, MD  traMADol (ULTRAM-ER) 300 MG 24 hr tablet Take 1 tablet (300 mg total) by mouth daily. 05/13/15  Yes Richard A  Sater, MD  citalopram (CELEXA) 20 MG tablet Take 1 tablet (20 mg total) by mouth daily. NO MORE REFILLS WITHOUT OFFICE VISIT - 2ND NOTICE Patient not taking: Reported on 05/15/2015 05/13/15   Britt Bottom, MD  diazepam (VALIUM) 5 MG tablet Take one or two pills before MRI Patient not taking: Reported on 06/02/2015 05/20/15   Britt Bottom, MD  diclofenac (VOLTAREN) 75 MG EC tablet Take 1 tablet (75 mg total) by mouth 2 (two) times daily. Patient not taking: Reported on 06/02/2015 05/13/15   Britt Bottom, MD  hydrocortisone 2.5 % cream Apply topically 2 (two) times daily. Patient not taking: Reported on 06/02/2015 05/20/14   [name], MD  ibuprofen (ADVIL,MOTRIN) 600 MG tablet  Take 1 tablet (600 mg total) by mouth every 6 (six) hours as needed. Patient not taking: Reported on 05/15/2015 04/13/15   Charlann Lange, PA-C  ipratropium (ATROVENT) 0.06 % nasal spray Place 2 sprays into both nostrils 4 (four) times daily. Patient not taking: Reported on 05/15/2015 03/16/15   Billy Fischer, MD  LORazepam (ATIVAN) 1 MG tablet Take 1 tablet (1 mg total) by mouth 2 (two) times daily as needed for anxiety. Patient not taking: Reported on 06/02/2015 03/10/14   [name], MD  methocarbamol (ROBAXIN) 500 MG tablet Take 1-2 tablets (500-1,000 mg total) by mouth every 6 (six) hours as needed for muscle spasms. Patient not taking: Reported on 06/02/2015 09/24/14   Waldemar Dickens, MD  methylPREDNISolone (MEDROL) 4 MG tablet Take 6 po x 1d, then 5 po x 1 d, then 4 po x 1d, then 3 po x 1d, then 2 po x 1 d, then 1 po x 1d Patient not taking: Reported on 06/02/2015 05/21/15   Britt Bottom, MD  tiZANidine (ZANAFLEX) 4 MG tablet Take 1 tablet (4 mg total) by mouth at bedtime. Patient not taking: Reported on 05/15/2015 05/13/15   Britt Bottom, MD  Vitamin D, Ergocalciferol, (DRISDOL) 50000 UNITS CAPS capsule Take 1 capsule (50,000 Units total) by mouth every 7 (seven) days. Patient not taking: Reported on 05/15/2015 03/10/14   [name], MD   BP 137/95 mmHg  Pulse 77  Temp(Src) 98.1 F (36.7 C) (Oral)  Resp 16  SpO2 95%  LMP 05/12/2015 Physical Exam  Constitutional: She is oriented to person, place, and time. She appears well-developed and well-nourished.  HENT:  Head: Normocephalic.  Eyes: Pupils are equal, round, and reactive to light.  Neck: Normal range of motion.  Cardiovascular: Normal rate and regular rhythm.   Pulmonary/Chest: Effort normal and breath sounds normal.  Musculoskeletal: Normal range of motion.  Neurological: She is alert and oriented to person, place, and time. She displays abnormal reflex. Coordination abnormal.  Ataxic gait with weakness L leg   Skin: Skin is  warm.  Nursing note and vitals reviewed.   ED Course  Procedures (including critical care time) Labs Review Labs Reviewed  CBC WITH DIFFERENTIAL/PLATELET - Abnormal; Notable for the following:    Lymphs Abs 0.6 (*)    All other components within normal limits  I-STAT CHEM 8, ED - Abnormal; Notable for the following:    Glucose, Bld 103 (*)    All other components within normal limits    Imaging Review No results found. I have personally reviewed and evaluated these images and lab results as part of my medical decision-making.   EKG Interpretation None      MDM   Final diagnoses:  Multiple sclerosis exacerbation ([place])  Numbness          Junius Creamer, NP 06/02/15 0426  Junius Creamer, NP 06/02/15 [place], MD 06/02/15 [phone]

## 2015-06-03 ENCOUNTER — Encounter: Payer: Self-pay | Admitting: Neurology

## 2015-06-03 ENCOUNTER — Encounter: Payer: Self-pay | Admitting: *Deleted

## 2015-06-03 ENCOUNTER — Ambulatory Visit (INDEPENDENT_AMBULATORY_CARE_PROVIDER_SITE_OTHER): Payer: Medicare Other | Admitting: Neurology

## 2015-06-03 VITALS — BP 136/70 | HR 82 | Resp 14 | Ht 66.0 in | Wt 155.6 lb

## 2015-06-03 DIAGNOSIS — R200 Anesthesia of skin: Secondary | ICD-10-CM | POA: Diagnosis not present

## 2015-06-03 DIAGNOSIS — R269 Unspecified abnormalities of gait and mobility: Secondary | ICD-10-CM | POA: Diagnosis not present

## 2015-06-03 DIAGNOSIS — G35 Multiple sclerosis: Secondary | ICD-10-CM | POA: Diagnosis not present

## 2015-06-03 DIAGNOSIS — F418 Other specified anxiety disorders: Secondary | ICD-10-CM

## 2015-06-03 DIAGNOSIS — Z79899 Other long term (current) drug therapy: Secondary | ICD-10-CM

## 2015-06-03 DIAGNOSIS — R5383 Other fatigue: Secondary | ICD-10-CM

## 2015-06-03 NOTE — Progress Notes (Signed)
[facility]  PATIENT: Dawn Peters DOB: 06-Apr-1980  REFERRING DOCTOR OR PCP:  Dr. Annitta Needs SOURCE: Patient, records in EMR, lab results and radiology reports in EMR, MRI images reviewed on PACS.  _________________________________   HISTORICAL  CHIEF COMPLAINT:  Chief Complaint  Patient presents with  . Multiple Sclerosis    Sts. she continues to tolerate Tecfidera well.  Sts. she had more trouble with gait/balance on Monday, so went to [facility] ED and received one dose of iv steroids.  Sts. sx. improved.  She did not have mri's that were ordered 05-19-15--sts. will call [facility] to r/s/fim    HISTORY OF PRESENT ILLNESS:   Dawn Peters is a 35 year old woman who was diagnosed with MS in 2000.    On 05/18/15, she had an exacerbation with left > right leg numbness, weakness and clumsiness.  She received IV solu-Medrol and symptoms have improved.     She has not yet had the MRI's ordered at that time.    She went back to the ED a few days ago with worsening gait and received another gram of IV Solu-Medrol and is doing better again.    She has been on Tecfidera x 2 years.   Prior to that she was on Tysabri and she thinks she did better on it than on Tecfidera.   However, she was JCV Ab positive and switched.     Gait/strength/sensation:    Gait has improved to her pre-exacerbation baseline. She has difficulty with her gait due to some weakness in the left leg.  She also reports some spasticity in her legs, worse on the left. She does not note any significant symptoms in the arms. She also has a painful tingling pain in the left leg.  She is currently on gabapentin 800 mg by mouth 3 times a day and lamotrigine 200 mg twice a day for the dysesthetic pain.  For spasticity she is on baclofen and tizanidine  Vision:    She denies any MS related visual problems.  Bladder:   She has urinary frequency and urgency helped by oxybutynin or Vesicare.. She has  nocturia once or twice a night.  Fatigue/sleep:   She denies any major difficulty with fatigue. She sleeps well most nights.  Mood:  She reports mild depression and anxiety and received some benefit from Celexa 20 mg by mouth daily. She is currently out of her medication  Cognition:   She denies any difficulties with her cognitive abilities.  Chronic pain:   She reports pain that is worse in the left leg or she also has pain in both shoulders. Besides gabapentin and lamotrigine, she also takes diclofenac and tramadol 50 mg twice daily.    In the past, she was on oxycontin.     MS history:   She was diagnosed with MS in 2000 after presenting with gait difficulties, numbness and headaches. An MRI of the brain was consistent with MS. She was then started on Betaseron. She had difficulty tolerating Betaseron and at some point switched to Novantrone. She took Novantrone every 3 months for a year or so. A little later, she was placed on Tysabri and she stayed on it for a couple of years. However, she was JCV-positive and switched off. She did well on Tysabri. She has been on Tecfidera since 2015 and has generally done well with it. Her last MRI from June 2015 showed that there had been no changes compared to an MRI from  2014.   She had an exacerbation March 2017.   The 08/07/2013 MRI of the brain shows multiple T2/FLAIR hyperintense foci located in the cerebellum, middle cerebellar peduncles, pons and in the periventricular white matter of the hemispheres in a pattern and configuration consistent with chronic demyelinating plaque associated with MS. There were no acute findings. There was no change compared to the previous MRI from 01/16/2013 that was also reviewed. An MRI of the cervical spine shows subtle T2 hyperintense signal within the cord and there were no acute findings.  REVIEW OF SYSTEMS: Constitutional: No fevers, chills, sweats, or change in appetite.   Notes fatigue Eyes: No visual changes,  double vision, eye pain Ear, nose and throat: No hearing loss, ear pain, nasal congestion, sore throat Cardiovascular: No chest pain, palpitations Respiratory: No shortness of breath at rest or with exertion.   No wheezes GastrointestinaI: No nausea, vomiting, diarrhea, abdominal pain, fecal incontinence Genitourinary: No dysuria, urinary retention or frequency.  No nocturia. Musculoskeletal: as above Integumentary: No rash, pruritus, skin lesions Neurological: as above Psychiatric: Mild depression at this time.  Mild anxiety Endocrine: No palpitations, diaphoresis, change in appetite, change in weigh or increased thirst Hematologic/Lymphatic: No anemia, purpura, petechiae. Allergic/Immunologic: No itchy/runny eyes, nasal congestion, recent allergic reactions, rashes  ALLERGIES: No Known Allergies  HOME MEDICATIONS:  Current outpatient prescriptions:  .  baclofen (LIORESAL) 20 MG tablet, Take 1 tablet (20 mg total) by mouth 3 (three) times daily as needed (for ms)., Disp: 90 each, Rfl: 11 .  citalopram (CELEXA) 20 MG tablet, Take 1 tablet (20 mg total) by mouth daily. NO MORE REFILLS WITHOUT OFFICE VISIT - 2ND NOTICE (Patient not taking: Reported on 05/15/2015), Disp: 30 tablet, Rfl: 11 .  diazepam (VALIUM) 5 MG tablet, Take one or two pills before MRI (Patient not taking: Reported on 06/02/2015), Disp: 2 tablet, Rfl: 0 .  diclofenac (VOLTAREN) 75 MG EC tablet, Take 1 tablet (75 mg total) by mouth 2 (two) times daily. (Patient not taking: Reported on 06/02/2015), Disp: 60 tablet, Rfl: 0 .  diclofenac sodium (VOLTAREN) 1 % GEL, Apply 4 g topically 4 (four) times daily. (Patient taking differently: Apply 4 g topically 4 (four) times daily as needed (pain). ), Disp: 60 g, Rfl: 0 .  Dimethyl Fumarate (TECFIDERA) 240 MG CPDR, Take 240 mg by mouth 2 (two) times daily., Disp: , Rfl:  .  gabapentin (NEURONTIN) 800 MG tablet, Take 1 tablet (800 mg total) by mouth 3 (three) times daily., Disp: 90  tablet, Rfl: 11 .  hydrocortisone 2.5 % cream, Apply topically 2 (two) times daily. (Patient not taking: Reported on 06/02/2015), Disp: 30 g, Rfl: 0 .  ibuprofen (ADVIL,MOTRIN) 600 MG tablet, Take 1 tablet (600 mg total) by mouth every 6 (six) hours as needed. (Patient not taking: Reported on 05/15/2015), Disp: 30 tablet, Rfl: 0 .  ipratropium (ATROVENT) 0.06 % nasal spray, Place 2 sprays into both nostrils 4 (four) times daily. (Patient not taking: Reported on 05/15/2015), Disp: 15 mL, Rfl: 1 .  lamoTRIgine (LAMICTAL) 200 MG tablet, Take 1 tablet (200 mg total) by mouth 2 (two) times daily., Disp: 60 tablet, Rfl: 11 .  lisinopril (PRINIVIL,ZESTRIL) 20 MG tablet, Take 1 tablet (20 mg total) by mouth daily., Disp: 30 tablet, Rfl: 11 .  LORazepam (ATIVAN) 1 MG tablet, Take 1 tablet (1 mg total) by mouth 2 (two) times daily as needed for anxiety. (Patient not taking: Reported on 06/02/2015), Disp: 20 tablet, Rfl: 0 .  methocarbamol (ROBAXIN)  500 MG tablet, Take 1-2 tablets (500-1,000 mg total) by mouth every 6 (six) hours as needed for muscle spasms. (Patient not taking: Reported on 06/02/2015), Disp: 60 tablet, Rfl: 0 .  methylPREDNISolone (MEDROL) 4 MG tablet, Take 6 po x 1d, then 5 po x 1 d, then 4 po x 1d, then 3 po x 1d, then 2 po x 1 d, then 1 po x 1d (Patient not taking: Reported on 06/02/2015), Disp: 21 tablet, Rfl: 0 .  Norgestimate-Ethinyl Estradiol Triphasic 0.18/0.215/0.25 MG-25 MCG tab, Take 1 tablet by mouth daily., Disp: 1 Package, Rfl: 11 .  omeprazole (PRILOSEC) 20 MG capsule, Take 20 mg by mouth daily as needed (heart burn). , Disp: , Rfl:  .  oxyCODONE-acetaminophen (PERCOCET/ROXICET) 5-325 MG tablet, Take 1 tablet by mouth every 6 (six) hours as needed for severe pain., Disp: 18 tablet, Rfl: 0 .  SUMAtriptan (IMITREX) 100 MG tablet, Take 1 tablet (100 mg total) by mouth once as needed for migraine., Disp: 10 tablet, Rfl: 2 .  tiZANidine (ZANAFLEX) 4 MG tablet, Take 1 tablet (4 mg total) by  mouth at bedtime. (Patient not taking: Reported on 05/15/2015), Disp: 30 tablet, Rfl: 11 .  traMADol (ULTRAM-ER) 300 MG 24 hr tablet, Take 1 tablet (300 mg total) by mouth daily., Disp: 30 tablet, Rfl: 3 .  Vitamin D, Ergocalciferol, (DRISDOL) 50000 UNITS CAPS capsule, Take 1 capsule (50,000 Units total) by mouth every 7 (seven) days. (Patient not taking: Reported on 05/15/2015), Disp: 12 capsule, Rfl: 0 .  [DISCONTINUED] Cetirizine HCl 10 MG CAPS, Take 10 mg q d prn allergies, Disp: 30 capsule, Rfl: 0  PAST MEDICAL HISTORY: Past Medical History  Diagnosis Date  . MS (multiple sclerosis) ([place])   . Hypertension   . Anxiety   . Depression     PAST SURGICAL HISTORY: Past Surgical History  Procedure Laterality Date  . Bilateral hip arthroscopy Left 07/2013  . Joint replacement Bilateral      R 2004 and L 2005    FAMILY HISTORY: Family History  Problem Relation Age of Onset  . Breast cancer      paternal grandmother dx age 23  . Colon cancer      paternal grandmother  . Healthy Mother   . Healthy Father     SOCIAL HISTORY:  Social History   Social History  . Marital Status: Single    Spouse Name: N/A  . Number of Children: N/A  . Years of Education: N/A   Occupational History  . Not on file.   Social History Main Topics  . Smoking status: Never Smoker   . Smokeless tobacco: Never Used  . Alcohol Use: No  . Drug Use: No  . Sexual Activity: Not Currently    Birth Control/ Protection: Pill   Other Topics Concern  . Not on file   Social History Narrative     PHYSICAL EXAM  Filed Vitals:   06/03/15 1605  BP: 136/70  Pulse: 82  Resp: 14  Height: 5\' 6"  (1.676 m)  Weight: 155 lb 9.6 oz (70.58 kg)    Body mass index is 25.13 kg/(m^2).   General: The patient is well-developed and well-nourished and in no acute distress   Neurologic Exam  Mental status: The patient is alert and oriented x 3 at the time of the examination. The patient has apparent normal  recent and remote memory, with an apparently normal attention span and concentration ability.   Speech is normal.  Cranial nerves: Extraocular  movements are full.  There is good facial sensation to soft touch bilaterally.Facial strength is normal.  Trapezius and sternocleidomastoid strength is normal. No dysarthria is noted.  The tongue is midline, and the patient has symmetric elevation of the soft palate. No obvious hearing deficits are noted.  Motor:  Muscle bulk is normal.   Tone is increased on legs, left > right. Strength is  5 / 5 in all 4 extremities.   Sensory: Sensory testing is currently intact to pinprick, soft touch and vibration sensation in all 4 extremities.  Coordination: Cerebellar testing reveals good finger-nose-finger   Gait and station: Station is normal.   Gait is normal. Tandem gait is normal. Romberg is negative.   Reflexes: Deep tendon reflexes are increased in legs, left > right and there is spread at the knees (worse on left) and 2 beats nonsustained clonus at the ankles.        DIAGNOSTIC DATA (LABS, IMAGING, TESTING) - I reviewed patient records, labs, notes, testing and imaging myself where available.  Lab Results  Component Value Date   WBC 7.3 06/02/2015   HGB 13.6 06/02/2015   HCT 40.0 06/02/2015   MCV 95.0 06/02/2015   PLT 255 06/02/2015      Component Value Date/Time   NA 140 06/02/2015 0325   K 3.6 06/02/2015 0325   CL 102 06/02/2015 0325   CO2 33* 05/15/2015 1005   GLUCOSE 103* 06/02/2015 0325   BUN 16 06/02/2015 0325   CREATININE 0.90 06/02/2015 0325   CREATININE 0.85 07/03/2014 0959   CALCIUM 10.1 05/15/2015 1005   PROT 6.7 07/03/2014 0959   ALBUMIN 4.3 07/03/2014 0959   AST 13 07/03/2014 0959   ALT 9 07/03/2014 0959   ALKPHOS 49 07/03/2014 0959   BILITOT 0.5 07/03/2014 0959   GFRNONAA >60 05/15/2015 1005   GFRNONAA 88 07/03/2014 0959   GFRAA >60 05/15/2015 1005   GFRAA >89 07/03/2014 0959   No results found for: CHOL, HDL,  LDLCALC, LDLDIRECT, TRIG, CHOLHDL Lab Results  Component Value Date   HGBA1C 4.7 11/05/2012   No results found for: VITAMINB12 Lab Results  Component Value Date   TSH 1.119 05/31/2013       ASSESSMENT AND PLAN  MULTIPLE SCLEROSIS  Gait disturbance  Numbness  Other fatigue  Depression with anxiety   1.   Due to her exacerbation while on Tecfidera, changing to a more efficacious medication is recommended. She had done well on Tysabri but stopped because she was JCV antibody positive. We discussed other options with less risk of PML including Lemtrada and ocrelizumab. We went over the pros and cons of each. She is more interested in [place] and we will see if we can get her authorized. 2.   Check labwork for chronic infections, CBC and CMP 3.   She will reschedule her MRIs that she no-showed. 4.   rtc prn based on results   Richard A. Felecia Shelling, MD, PhD [id number], [id number] PM Certified in Neurology, Clinical Neurophysiology, Sleep Medicine, Pain Medicine and Neuroimaging  [facility] [address] [place], [place] [address] [phone]

## 2015-06-04 LAB — CBC WITH DIFFERENTIAL/PLATELET
Basophils Absolute: 0 10*3/uL (ref 0.0–0.2)
Basos: 0 %
EOS (ABSOLUTE): 0 10*3/uL (ref 0.0–0.4)
EOS: 0 %
HEMATOCRIT: 41.4 % (ref 34.0–46.6)
HEMOGLOBIN: 14 g/dL (ref 11.1–15.9)
Immature Grans (Abs): 0 10*3/uL (ref 0.0–0.1)
Immature Granulocytes: 1 %
LYMPHS ABS: 0.8 10*3/uL (ref 0.7–3.1)
Lymphs: 14 %
MCH: 33.3 pg — AB (ref 26.6–33.0)
MCHC: 33.8 g/dL (ref 31.5–35.7)
MCV: 98 fL — ABNORMAL HIGH (ref 79–97)
MONOCYTES: 10 %
MONOS ABS: 0.6 10*3/uL (ref 0.1–0.9)
NEUTROS ABS: 4.4 10*3/uL (ref 1.4–7.0)
Neutrophils: 75 %
Platelets: 287 10*3/uL (ref 150–379)
RBC: 4.21 x10E6/uL (ref 3.77–5.28)
RDW: 14.5 % (ref 12.3–15.4)
WBC: 5.8 10*3/uL (ref 3.4–10.8)

## 2015-06-04 LAB — COMPREHENSIVE METABOLIC PANEL
ALBUMIN: 4.4 g/dL (ref 3.5–5.5)
ALK PHOS: 57 IU/L (ref 39–117)
ALT: 11 IU/L (ref 0–32)
AST: 13 IU/L (ref 0–40)
Albumin/Globulin Ratio: 1.6 (ref 1.2–2.2)
BILIRUBIN TOTAL: 0.3 mg/dL (ref 0.0–1.2)
BUN / CREAT RATIO: 20 (ref 8–20)
BUN: 20 mg/dL (ref 6–20)
CHLORIDE: 99 mmol/L (ref 96–106)
CO2: 30 mmol/L — ABNORMAL HIGH (ref 18–29)
CREATININE: 1.02 mg/dL — AB (ref 0.57–1.00)
Calcium: 9.4 mg/dL (ref 8.7–10.2)
GFR calc non Af Amer: 71 mL/min/{1.73_m2} (ref >59)
GFR, EST AFRICAN AMERICAN: 82 mL/min/{1.73_m2} (ref >59)
GLOBULIN, TOTAL: 2.8 g/dL (ref 1.5–4.5)
Glucose: 90 mg/dL (ref 65–99)
Potassium: 3.7 mmol/L (ref 3.5–5.2)
SODIUM: 143 mmol/L (ref 134–144)
TOTAL PROTEIN: 7.2 g/dL (ref 6.0–8.5)

## 2015-06-04 LAB — HEPATITIS B SURFACE ANTIBODY,QUALITATIVE: HEP B SURFACE AB, QUAL: NONREACTIVE

## 2015-06-04 LAB — HEPATITIS B CORE ANTIBODY, TOTAL: Hep B Core Total Ab: NEGATIVE

## 2015-06-04 LAB — HEPATITIS B SURFACE ANTIGEN: Hepatitis B Surface Ag: NEGATIVE

## 2015-06-05 LAB — QUANTIFERON TB GOLD ASSAY (BLOOD)

## 2015-06-05 LAB — QUANTIFERON IN TUBE
QFT TB AG MINUS NIL VALUE: 0 IU/mL
QUANTIFERON MITOGEN VALUE: 2.57 IU/mL
QUANTIFERON TB AG VALUE: 0.09 IU/mL
QUANTIFERON TB GOLD: NEGATIVE
Quantiferon Nil Value: 0.09 IU/mL

## 2015-06-08 ENCOUNTER — Encounter: Payer: Self-pay | Admitting: *Deleted

## 2015-06-09 ENCOUNTER — Telehealth: Payer: Self-pay | Admitting: *Deleted

## 2015-06-09 NOTE — Telephone Encounter (Signed)
-----   Message from Britt Bottom, MD sent at 06/05/2015  8:51 AM EDT ----- Labs are ok.   We can send in [place] form

## 2015-06-09 NOTE — Telephone Encounter (Signed)
I have spoken with Dawn Peters this morning and per RAS, advised that labs are ok for Lemtrada.  She verbalized understanding of same, sts. would like to proceed with [name], so I have completed Lemtrada start form and turned it into the infusion suite/[facility]/fim

## 2015-06-10 ENCOUNTER — Telehealth: Payer: Self-pay | Admitting: Neurology

## 2015-06-10 NOTE — Telephone Encounter (Signed)
Patient called requesting to speak to Faith regarding why Dr. Felecia Shelling only sent Rx for 30 tablets of traMADol (ULTRAM-ER) 300 MG 24 hr tablet instead of the usual 60 or 90? Please call patient at [phone].

## 2015-06-10 NOTE — Telephone Encounter (Signed)
LMOM (identified vm) that she has been on Tramadol 50mg  3-4 times daily in the past.  Recent rx. was for Tramadol XR 300mg , which is once daily.  She does not need to return this call unless she has questions/fim

## 2015-06-11 ENCOUNTER — Ambulatory Visit (INDEPENDENT_AMBULATORY_CARE_PROVIDER_SITE_OTHER): Payer: Medicare Other | Admitting: Neurology

## 2015-06-11 ENCOUNTER — Telehealth: Payer: Self-pay | Admitting: *Deleted

## 2015-06-11 ENCOUNTER — Encounter: Payer: Self-pay | Admitting: Neurology

## 2015-06-11 VITALS — BP 148/96 | HR 82 | Resp 16 | Ht 66.0 in | Wt 155.0 lb

## 2015-06-11 DIAGNOSIS — N399 Disorder of urinary system, unspecified: Secondary | ICD-10-CM | POA: Diagnosis not present

## 2015-06-11 DIAGNOSIS — R5383 Other fatigue: Secondary | ICD-10-CM | POA: Diagnosis not present

## 2015-06-11 DIAGNOSIS — G35 Multiple sclerosis: Secondary | ICD-10-CM | POA: Diagnosis not present

## 2015-06-11 DIAGNOSIS — R269 Unspecified abnormalities of gait and mobility: Secondary | ICD-10-CM

## 2015-06-11 NOTE — Progress Notes (Signed)
[facility]  PATIENT: Dawn Peters DOB: [birth year]  REFERRING DOCTOR OR PCP:  Dr. Annitta Needs SOURCE: Patient, records in EMR, lab results and radiology reports in EMR, MRI images reviewed on PACS.  _________________________________   HISTORICAL  CHIEF COMPLAINT:  Chief Complaint  Patient presents with  . Multiple Sclerosis    Sts. left leg weakness, gait, fatigue are much worse since waking this morning./fim    HISTORY OF PRESENT ILLNESS:   Dawn Peters is a 35 year old woman who was diagnosed with MS in 2000.      This morning, Dawn Peters woke up and felt much weaker in the left leg.  Dawn Peters fell this morning in the tub but did not hit head or lose consciousness.     Dawn Peters also has felt more sleepy all day.    Dawn Peters denies taking any more medication than Dawn Peters usually does.    Dawn Peters denies any fevers or change in bladder.     On 05/18/15, Dawn Peters had an exacerbation with left > right leg numbness, weakness and clumsiness.  Dawn Peters received IV solu-Medrol and symptoms have improved.      Dawn Peters has been on Tecfidera x 2 years.   Prior to that Dawn Peters was on Tysabri and Dawn Peters thinks Dawn Peters did better on it than on Tecfidera.   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    Last week, I signed her up for Lemtrada and we are in the process of getting that for her.      Gait/strength/sensation:     Dawn Peters has difficulty with her gait due to left leg weakness and clumsiness.  Dawn Peters also has spasticity in her legs, worse on the left. Dawn Peters does not note any significant symptoms in the arms. Dawn Peters has a painful tingling pain in the left leg.  Dawn Peters is currently on gabapentin 800 mg by mouth 3 times a day and lamotrigine 200 mg twice a day for the dysesthetic pain.  For spasticity Dawn Peters is on baclofen and tizanidine  Vision:    Dawn Peters denies any MS related visual problems.  Bladder:   Dawn Peters has urinary frequency and urgency helped by oxybutynin or Vesicare..   Occasionally, Dawn Peters has urinary hesitancy. Dawn Peters has nocturia once or twice  a night.  Fatigue/sleep:   Dawn Peters denies any major difficulty with fatigue. Dawn Peters sleeps well most nights.  Mood:  Dawn Peters reports mild depression and anxiety and received some benefit from Celexa 20 mg by mouth daily.   Cognition:   Dawn Peters denies any difficulties with her cognitive abilities.  Chronic pain:   Dawn Peters reports chronic left > right leg and back pain and pain in both shoulders. Besides gabapentin and lamotrigine, Dawn Peters also takes diclofenac and tramadol.    In the past, Dawn Peters was on oxycontin.     MS history:   Dawn Peters was diagnosed with MS in 2000 after presenting with gait difficulties, numbness and headaches. An MRI of the brain was consistent with MS. Dawn Peters was then started on Betaseron. Dawn Peters had difficulty tolerating Betaseron and at some point switched to Novantrone. Dawn Peters took Novantrone every 3 months for a year or so. A little later, Dawn Peters was placed on Tysabri and Dawn Peters stayed on it for a couple of years. However, Dawn Peters was JCV-positive and switched off. Dawn Peters did well on Tysabri. Dawn Peters has been on Tecfidera since 2015 and has generally done well with it. Her last MRI from June 2015 showed that there had been no changes compared to an MRI from 2014.  Dawn Peters had an exacerbation March 2017.   The 08/07/2013 MRI of the brain shows multiple T2/FLAIR hyperintense foci located in the cerebellum, middle cerebellar peduncles, pons and in the periventricular white matter of the hemispheres in a pattern and configuration consistent with chronic demyelinating plaque associated with MS. There were no acute findings. There was no change compared to the previous MRI from 01/16/2013 that was also reviewed. An MRI of the cervical spine shows subtle T2 hyperintense signal within the cord and there were no acute findings.  REVIEW OF SYSTEMS: Constitutional: No fevers, chills, sweats, or change in appetite.   Notes fatigue Eyes: No visual changes, double vision, eye pain Ear, nose and throat: No hearing loss, ear pain, nasal congestion,  sore throat Cardiovascular: No chest pain, palpitations Respiratory: No shortness of breath at rest or with exertion.   No wheezes GastrointestinaI: No nausea, vomiting, diarrhea, abdominal pain, fecal incontinence Genitourinary: No dysuria, urinary retention or frequency.  No nocturia. Musculoskeletal: as above Integumentary: No rash, pruritus, skin lesions Neurological: as above Psychiatric: Mild depression at this time.  Mild anxiety Endocrine: No palpitations, diaphoresis, change in appetite, change in weigh or increased thirst Hematologic/Lymphatic: No anemia, purpura, petechiae. Allergic/Immunologic: No itchy/runny eyes, nasal congestion, recent allergic reactions, rashes  ALLERGIES: No Known Allergies  HOME MEDICATIONS:  Current outpatient prescriptions:  .  baclofen (LIORESAL) 20 MG tablet, Take 1 tablet (20 mg total) by mouth 3 (three) times daily as needed (for ms)., Disp: 90 each, Rfl: 11 .  citalopram (CELEXA) 20 MG tablet, Take 1 tablet (20 mg total) by mouth daily. NO MORE REFILLS WITHOUT OFFICE VISIT - 2ND NOTICE, Disp: 30 tablet, Rfl: 11 .  diclofenac (VOLTAREN) 75 MG EC tablet, Take 1 tablet (75 mg total) by mouth 2 (two) times daily., Disp: 60 tablet, Rfl: 0 .  Dimethyl Fumarate (TECFIDERA) 240 MG CPDR, Take 240 mg by mouth 2 (two) times daily., Disp: , Rfl:  .  gabapentin (NEURONTIN) 800 MG tablet, Take 1 tablet (800 mg total) by mouth 3 (three) times daily., Disp: 90 tablet, Rfl: 11 .  lamoTRIgine (LAMICTAL) 200 MG tablet, Take 1 tablet (200 mg total) by mouth 2 (two) times daily., Disp: 60 tablet, Rfl: 11 .  lisinopril (PRINIVIL,ZESTRIL) 20 MG tablet, Take 1 tablet (20 mg total) by mouth daily., Disp: 30 tablet, Rfl: 11 .  LORazepam (ATIVAN) 1 MG tablet, Take 1 tablet (1 mg total) by mouth 2 (two) times daily as needed for anxiety., Disp: 20 tablet, Rfl: 0 .  methocarbamol (ROBAXIN) 500 MG tablet, Take 1-2 tablets (500-1,000 mg total) by mouth every 6 (six) hours  as needed for muscle spasms., Disp: 60 tablet, Rfl: 0 .  Norgestimate-Ethinyl Estradiol Triphasic 0.18/0.215/0.25 MG-25 MCG tab, Take 1 tablet by mouth daily., Disp: 1 Package, Rfl: 11 .  oxyCODONE-acetaminophen (PERCOCET/ROXICET) 5-325 MG tablet, Take 1 tablet by mouth every 6 (six) hours as needed for severe pain., Disp: 18 tablet, Rfl: 0 .  SUMAtriptan (IMITREX) 100 MG tablet, Take 1 tablet (100 mg total) by mouth once as needed for migraine., Disp: 10 tablet, Rfl: 2 .  tiZANidine (ZANAFLEX) 4 MG tablet, Take 1 tablet (4 mg total) by mouth at bedtime., Disp: 30 tablet, Rfl: 11 .  traMADol (ULTRAM-ER) 300 MG 24 hr tablet, Take 1 tablet (300 mg total) by mouth daily., Disp: 30 tablet, Rfl: 3 .  Alemtuzumab (LEMTRADA) 12 MG/1.2ML SOLN, Inject 12 mg into the vein. Reported on 06/11/2015, Disp: , Rfl:  .  diazepam (VALIUM) 5  MG tablet, Take one or two pills before MRI (Patient not taking: Reported on 06/02/2015), Disp: 2 tablet, Rfl: 0 .  methylPREDNISolone (MEDROL) 4 MG tablet, Take 6 po x 1d, then 5 po x 1 d, then 4 po x 1d, then 3 po x 1d, then 2 po x 1 d, then 1 po x 1d (Patient not taking: Reported on 06/02/2015), Disp: 21 tablet, Rfl: 0 .  [DISCONTINUED] Cetirizine HCl 10 MG CAPS, Take 10 mg q d prn allergies, Disp: 30 capsule, Rfl: 0  PAST MEDICAL HISTORY: Past Medical History  Diagnosis Date  . MS (multiple sclerosis) ([place])   . Hypertension   . Anxiety   . Depression     PAST SURGICAL HISTORY: Past Surgical History  Procedure Laterality Date  . Bilateral hip arthroscopy Left 07/2013  . Joint replacement Bilateral      R 2004 and L 2005    FAMILY HISTORY: Family History  Problem Relation Age of Onset  . Breast cancer      paternal grandmother dx age 8  . Colon cancer      paternal grandmother  . Healthy Mother   . Healthy Father     SOCIAL HISTORY:  Social History   Social History  . Marital Status: Single    Spouse Name: N/A  . Number of Children: N/A  . Years of  Education: N/A   Occupational History  . Not on file.   Social History Main Topics  . Smoking status: Never Smoker   . Smokeless tobacco: Never Used  . Alcohol Use: No  . Drug Use: No  . Sexual Activity: Not Currently    Birth Control/ Protection: Pill   Other Topics Concern  . Not on file   Social History Narrative     PHYSICAL EXAM  Filed Vitals:   06/11/15 1428  BP: 148/96  Pulse: 82  Resp: 16  Height: 5\' 6"  (1.676 m)  Weight: 155 lb (70.308 kg)    Body mass index is 25.03 kg/(m^2).   General: The patient is well-developed and well-nourished and in no acute distress   Neurologic Exam  Mental status: The patient is lethargic but oriented x 3 at the time of the examination. The patient has apparent normal recent and remote memory, with mildly reduced attention span and concentration ability.   Speech is normal.  Cranial nerves: Extraocular movements are full.  There is good facial sensation to soft touch bilaterally.Facial strength is normal.  Trapezius and sternocleidomastoid strength is normal. No dysarthria is noted.  The tongue is midline, and the patient has symmetric elevation of the soft palate. No obvious hearing deficits are noted.  Motor:  Muscle bulk is normal.   Tone is increased on legs, left > right. Strength is  5 / 5 in all 4 extremities.   Sensory: Sensory testing is currently intact to pinprick, soft touch and vibration sensation in all 4 extremities.  Coordination: Cerebellar testing reveals good finger-nose-finger   Gait and station: Station requires unilateral support.   Gait is wide-based and requires support. Dawn Peters appears ataxic.   Reflexes: Deep tendon reflexes are increased in legs, left > right and there is spread at the knees (worse on left) and  nonsustained clonus at the left ankles.        DIAGNOSTIC DATA (LABS, IMAGING, TESTING) - I reviewed patient records, labs, notes, testing and imaging myself where available.  Lab Results    Component Value Date   WBC 5.8 06/03/2015  HGB 13.6 06/02/2015   HCT 41.4 06/03/2015   MCV 98* 06/03/2015   PLT 287 06/03/2015      Component Value Date/Time   NA 143 06/03/2015 1635   NA 140 06/02/2015 0325   K 3.7 06/03/2015 1635   CL 99 06/03/2015 1635   CO2 30* 06/03/2015 1635   GLUCOSE 90 06/03/2015 1635   GLUCOSE 103* 06/02/2015 0325   BUN 20 06/03/2015 1635   BUN 16 06/02/2015 0325   CREATININE 1.02* 06/03/2015 1635   CREATININE 0.85 07/03/2014 0959   CALCIUM 9.4 06/03/2015 1635   PROT 7.2 06/03/2015 1635   PROT 6.7 07/03/2014 0959   ALBUMIN 4.4 06/03/2015 1635   ALBUMIN 4.3 07/03/2014 0959   AST 13 06/03/2015 1635   ALT 11 06/03/2015 1635   ALKPHOS 57 06/03/2015 1635   BILITOT 0.3 06/03/2015 1635   BILITOT 0.5 07/03/2014 0959   GFRNONAA 71 06/03/2015 1635   GFRNONAA 88 07/03/2014 0959   GFRAA 82 06/03/2015 1635   GFRAA >89 07/03/2014 0959   No results found for: CHOL, HDL, LDLCALC, LDLDIRECT, TRIG, CHOLHDL Lab Results  Component Value Date   HGBA1C 4.7 11/05/2012   No results found for: VITAMINB12 Lab Results  Component Value Date   TSH 1.119 05/31/2013       ASSESSMENT AND PLAN  MULTIPLE SCLEROSIS - Plan: Urinalysis, Routine w reflex microscopic (not at [facility]), Culture, Urine  Gait disturbance  Other fatigue  Urinary disorder - Plan: Urinalysis, Routine w reflex microscopic (not at [facility]), Culture, Urine    1.   IV Solu-Medrol today and tomorrow and possibly 2-3 more days next week if Dawn Peters is not better. 2.   We will try to get her started on Lemtrada as soon as possible. 3.   Dawn Peters has rescheduled her MRIs forSaturday 4.    Check UA/UCx. Return for infusion or call if any new or worsening neurologic symptoms per   Tiari Andringa A. Felecia Shelling, MD, PhD [id number], [id number] PM Certified in Neurology, Clinical Neurophysiology, Sleep Medicine, Pain Medicine and Neuroimaging  [facility] [address], [place], [place] [address] [phone]

## 2015-06-11 NOTE — Telephone Encounter (Signed)
Dawn Peters called--she c/o visual disturbance, dragging left leg, weak grips bilat onset this am.  Appt. given with RAS at 1120 today/fim

## 2015-06-12 ENCOUNTER — Encounter: Payer: Self-pay | Admitting: *Deleted

## 2015-06-13 ENCOUNTER — Ambulatory Visit
Admission: RE | Admit: 2015-06-13 | Discharge: 2015-06-13 | Disposition: A | Discharge status: Home / Self Care | Payer: Medicare Other | Source: Ambulatory Visit | Attending: Neurology | Admitting: Neurology

## 2015-06-13 DIAGNOSIS — R29898 Other symptoms and signs involving the musculoskeletal system: Secondary | ICD-10-CM

## 2015-06-13 DIAGNOSIS — G35 Multiple sclerosis: Secondary | ICD-10-CM | POA: Diagnosis not present

## 2015-06-13 DIAGNOSIS — R269 Unspecified abnormalities of gait and mobility: Secondary | ICD-10-CM

## 2015-06-13 MED ORDER — GADOBENATE DIMEGLUMINE 529 MG/ML IV SOLN
10.0000 mL | Freq: Once | INTRAVENOUS | Status: AC | PRN
Start: 2015-06-13 — End: 2015-06-13
  Administered 2015-06-13: 10 mL via INTRAVENOUS

## 2015-06-15 ENCOUNTER — Other Ambulatory Visit: Payer: Self-pay | Admitting: Neurology

## 2015-06-15 ENCOUNTER — Telehealth: Payer: Self-pay | Admitting: Neurology

## 2015-06-15 ENCOUNTER — Encounter: Payer: Self-pay | Admitting: *Deleted

## 2015-06-15 MED ORDER — METHYLPREDNISOLONE 4 MG PO TABS: ORAL_TABLET | ORAL | Status: DC

## 2015-06-15 MED ORDER — METHYLPREDNISOLONE 4 MG PO TBPK: ORAL_TABLET | ORAL | Status: DC

## 2015-06-15 NOTE — Telephone Encounter (Signed)
I received a phone call from answering service to call the patient. She informed me that she had fallen again and felt her legs were weak and Dr. Felecia Shelling had instructed her to call to request a prescription for Medrol Dosepak. I called in a prescription for Medrol Dosepak electronically to [facility] on [address] upon patient's request. She voiced understanding.

## 2015-06-30 ENCOUNTER — Ambulatory Visit (INDEPENDENT_AMBULATORY_CARE_PROVIDER_SITE_OTHER): Payer: Medicare Other | Admitting: Neurology

## 2015-06-30 ENCOUNTER — Encounter: Payer: Self-pay | Admitting: Neurology

## 2015-06-30 VITALS — BP 118/84 | HR 74 | Resp 14 | Ht 66.0 in | Wt 152.5 lb

## 2015-06-30 DIAGNOSIS — R200 Anesthesia of skin: Secondary | ICD-10-CM | POA: Diagnosis not present

## 2015-06-30 DIAGNOSIS — R5383 Other fatigue: Secondary | ICD-10-CM | POA: Diagnosis not present

## 2015-06-30 DIAGNOSIS — G35 Multiple sclerosis: Secondary | ICD-10-CM

## 2015-06-30 DIAGNOSIS — R269 Unspecified abnormalities of gait and mobility: Secondary | ICD-10-CM

## 2015-06-30 DIAGNOSIS — F418 Other specified anxiety disorders: Secondary | ICD-10-CM

## 2015-06-30 MED ORDER — TAMSULOSIN HCL 0.4 MG PO CAPS: 0.4000 mg | ORAL_CAPSULE | Freq: Every day | ORAL | Status: DC

## 2015-06-30 MED ORDER — VALACYCLOVIR HCL 500 MG PO TABS: 500.0000 mg | ORAL_TABLET | Freq: Two times a day (BID) | ORAL | Status: DC

## 2015-06-30 NOTE — Progress Notes (Signed)
[facility]  PATIENT: Dawn Peters DOB: 02/12/1981  REFERRING DOCTOR OR PCP:  Dr. Annitta Needs SOURCE: Patient, records in EMR, lab results and radiology reports in EMR, MRI images reviewed on PACS.  _________________________________   HISTORICAL  CHIEF COMPLAINT:  Chief Complaint  Patient presents with  . Multiple Sclerosis    Sts. left sided weakness has resolved and she is back to baseline.  She stopped Tecfidera 2 weeks ago and is waiting to start Lemtrada./fim    HISTORY OF PRESENT ILLNESS:   Dawn Peters is a 35 year old woman who was diagnosed with MS in 2000.    She has had 2 exacerbations with left-sided weakness since starting Tecfidera. She believes she was more stable on Tysabri but stopped as she was JCV-positive. We have discussed Lemtrada but are having difficulty getting a site for her to be infused.  Since the last visit, she reports that she is doing better with improved gait and balance. I personally reviewed the MRIs of the brain and spine in her presence. The MRI of the brain is unchanged compared to her previous one. The MRI of the cervical spine and thoracic spine shows a couple T2 hyperintense foci consistent with MS, though none appears to be acute.  Gait/strength/sensation:     She has mild left leg weakness and clumsiness andspasticity in her legs, worse on the left. She does not note any significant symptoms in the arms. She has a painful tingling pain in the left leg.  She is currently on gabapentin 800 mg by mouth 3 times a day and lamotrigine 200 mg twice a day for the dysesthetic pain.  For spasticity she is on baclofen and tizanidine  Vision:    She denies any MS related visual problems.  Bladder:   She has urinary frequency and urgency helped by oxybutynin or Vesicare..   She notes more urinary hesitancy. She has nocturia once or twice a night.  Fatigue/sleep:   She denies any major difficulty with fatigue. She sleeps well  most nights.  Mood:  She reports mild depression and anxiety and received some benefit from Celexa 20 mg by mouth daily.   Cognition:   She denies any difficulties with her cognitive abilities.  Chronic pain:   She reports chronic left > right leg and back pain and pain in both shoulders. Besides gabapentin and lamotrigine, she also takes diclofenac and tramadol.    In the past, she was on oxycontin.     MS history:   She was diagnosed with MS in 2000 after presenting with gait difficulties, numbness and headaches. An MRI of the brain was consistent with MS. She was then started on Betaseron. She had difficulty tolerating Betaseron and at some point switched to Novantrone. She took Novantrone every 3 months for a year or so. A little later, she was placed on Tysabri and she stayed on it for a couple of years. However, she was JCV-positive and switched off. She did well on Tysabri. She has been on Tecfidera since 2015 and has generally done well with it. Her last MRI from June 2015 showed that there had been no changes compared to an MRI from 2014.   She had an exacerbation March 2017.   The 08/07/2013 MRI of the brain shows multiple T2/FLAIR hyperintense foci located in the cerebellum, middle cerebellar peduncles, pons and in the periventricular white matter of the hemispheres in a pattern and configuration consistent with chronic demyelinating plaque associated with MS. There  were no acute findings. There was no change compared to the previous MRI from 01/16/2013 that was also reviewed. An MRI of the cervical spine shows subtle T2 hyperintense signal within the cord and there were no acute findings.  REVIEW OF SYSTEMS: Constitutional: No fevers, chills, sweats, or change in appetite.   Notes fatigue Eyes: No visual changes, double vision, eye pain Ear, nose and throat: No hearing loss, ear pain, nasal congestion, sore throat Cardiovascular: No chest pain, palpitations Respiratory: No shortness of  breath at rest or with exertion.   No wheezes GastrointestinaI: No nausea, vomiting, diarrhea, abdominal pain, fecal incontinence Genitourinary: No dysuria, urinary retention or frequency.  No nocturia. Musculoskeletal: as above Integumentary: No rash, pruritus, skin lesions Neurological: as above Psychiatric: Mild depression at this time.  Mild anxiety Endocrine: No palpitations, diaphoresis, change in appetite, change in weigh or increased thirst Hematologic/Lymphatic: No anemia, purpura, petechiae. Allergic/Immunologic: No itchy/runny eyes, nasal congestion, recent allergic reactions, rashes  ALLERGIES: No Known Allergies  HOME MEDICATIONS:  Current outpatient prescriptions:  .  baclofen (LIORESAL) 20 MG tablet, Take 1 tablet (20 mg total) by mouth 3 (three) times daily as needed (for ms)., Disp: 90 each, Rfl: 11 .  citalopram (CELEXA) 20 MG tablet, Take 1 tablet (20 mg total) by mouth daily. NO MORE REFILLS WITHOUT OFFICE VISIT - 2ND NOTICE, Disp: 30 tablet, Rfl: 11 .  diazepam (VALIUM) 5 MG tablet, Take one or two pills before MRI, Disp: 2 tablet, Rfl: 0 .  diclofenac (VOLTAREN) 75 MG EC tablet, Take 1 tablet (75 mg total) by mouth 2 (two) times daily., Disp: 60 tablet, Rfl: 0 .  gabapentin (NEURONTIN) 800 MG tablet, Take 1 tablet (800 mg total) by mouth 3 (three) times daily., Disp: 90 tablet, Rfl: 11 .  lamoTRIgine (LAMICTAL) 200 MG tablet, Take 1 tablet (200 mg total) by mouth 2 (two) times daily., Disp: 60 tablet, Rfl: 11 .  lisinopril (PRINIVIL,ZESTRIL) 20 MG tablet, Take 1 tablet (20 mg total) by mouth daily., Disp: 30 tablet, Rfl: 11 .  LORazepam (ATIVAN) 1 MG tablet, Take 1 tablet (1 mg total) by mouth 2 (two) times daily as needed for anxiety., Disp: 20 tablet, Rfl: 0 .  methocarbamol (ROBAXIN) 500 MG tablet, Take 1-2 tablets (500-1,000 mg total) by mouth every 6 (six) hours as needed for muscle spasms., Disp: 60 tablet, Rfl: 0 .  Norgestimate-Ethinyl Estradiol Triphasic  0.18/0.215/0.25 MG-25 MCG tab, Take 1 tablet by mouth daily., Disp: 1 Package, Rfl: 11 .  SUMAtriptan (IMITREX) 100 MG tablet, Take 1 tablet (100 mg total) by mouth once as needed for migraine., Disp: 10 tablet, Rfl: 2 .  tiZANidine (ZANAFLEX) 4 MG tablet, Take 1 tablet (4 mg total) by mouth at bedtime., Disp: 30 tablet, Rfl: 11 .  traMADol (ULTRAM-ER) 300 MG 24 hr tablet, Take 1 tablet (300 mg total) by mouth daily., Disp: 30 tablet, Rfl: 3 .  Alemtuzumab (LEMTRADA) 12 MG/1.2ML SOLN, Inject 12 mg into the vein. Reported on 06/11/2015, Disp: , Rfl:  .  Dimethyl Fumarate (TECFIDERA) 240 MG CPDR, Take 240 mg by mouth 2 (two) times daily. Reported on 06/30/2015, Disp: , Rfl:  .  [DISCONTINUED] Cetirizine HCl 10 MG CAPS, Take 10 mg q d prn allergies, Disp: 30 capsule, Rfl: 0  PAST MEDICAL HISTORY: Past Medical History  Diagnosis Date  . MS (multiple sclerosis) ([place])   . Hypertension   . Anxiety   . Depression     PAST SURGICAL HISTORY: Past Surgical History  Procedure Laterality Date  . Bilateral hip arthroscopy Left 07/2013  . Joint replacement Bilateral      R 2004 and L 2005    FAMILY HISTORY: Family History  Problem Relation Age of Onset  . Breast cancer      paternal grandmother dx age 68  . Colon cancer      paternal grandmother  . Healthy Mother   . Healthy Father     SOCIAL HISTORY:  Social History   Social History  . Marital Status: Single    Spouse Name: N/A  . Number of Children: N/A  . Years of Education: N/A   Occupational History  . Not on file.   Social History Main Topics  . Smoking status: Never Smoker   . Smokeless tobacco: Never Used  . Alcohol Use: No  . Drug Use: No  . Sexual Activity: Not Currently    Birth Control/ Protection: Pill   Other Topics Concern  . Not on file   Social History Narrative     PHYSICAL EXAM  Filed Vitals:   06/30/15 1056  BP: 118/84  Pulse: 74  Resp: 14  Height: 5\' 6"  (1.676 m)  Weight: 152 lb 8 oz (69.174  kg)    Body mass index is 24.63 kg/(m^2).   General: The patient is well-developed and well-nourished and in no acute distress   Neurologic Exam  Mental status: The patient is lethargic but oriented x 3 at the time of the examination. The patient has apparent normal recent and remote memory, with mildly reduced attention span and concentration ability.   Speech is normal.  Cranial nerves: Extraocular movements are full.  There is good facial sensation to soft touch bilaterally.Facial strength is normal.  Trapezius and sternocleidomastoid strength is normal. No dysarthria is noted.  The tongue is midline, and the patient has symmetric elevation of the soft palate. No obvious hearing deficits are noted.  Motor:  Muscle bulk is normal.   Tone is increased on legs, left > right. Strength is  5 / 5 in all 4 extremities.   Sensory: Sensory testing is currently intact to pinprick, soft touch and vibration sensation in all 4 extremities.  Coordination: Cerebellar testing reveals good finger-nose-finger   Gait and station: Station is now near normal.     Gait is wide but improved, tandem is poor.    c.   Reflexes: Deep tendon reflexes are increased in legs, left > right and there is spread at the knees (worse on left) and  nonsustained clonus at the left ankles.        DIAGNOSTIC DATA (LABS, IMAGING, TESTING) - I reviewed patient records, labs, notes, testing and imaging myself where available.  Lab Results  Component Value Date   WBC 5.8 06/03/2015   HGB 13.6 06/02/2015   HCT 41.4 06/03/2015   MCV 98* 06/03/2015   PLT 287 06/03/2015      Component Value Date/Time   NA 143 06/03/2015 1635   NA 140 06/02/2015 0325   K 3.7 06/03/2015 1635   CL 99 06/03/2015 1635   CO2 30* 06/03/2015 1635   GLUCOSE 90 06/03/2015 1635   GLUCOSE 103* 06/02/2015 0325   BUN 20 06/03/2015 1635   BUN 16 06/02/2015 0325   CREATININE 1.02* 06/03/2015 1635   CREATININE 0.85 07/03/2014 0959   CALCIUM 9.4  06/03/2015 1635   PROT 7.2 06/03/2015 1635   PROT 6.7 07/03/2014 0959   ALBUMIN 4.4 06/03/2015 1635   ALBUMIN 4.3 07/03/2014  0959   AST 13 06/03/2015 1635   ALT 11 06/03/2015 1635   ALKPHOS 57 06/03/2015 1635   BILITOT 0.3 06/03/2015 1635   BILITOT 0.5 07/03/2014 0959   GFRNONAA 71 06/03/2015 1635   GFRNONAA 88 07/03/2014 0959   GFRAA 82 06/03/2015 1635   GFRAA >89 07/03/2014 0959   No results found for: CHOL, HDL, LDLCALC, LDLDIRECT, TRIG, CHOLHDL Lab Results  Component Value Date   HGBA1C 4.7 11/05/2012   No results found for: VITAMINB12 Lab Results  Component Value Date   TSH 1.119 05/31/2013       ASSESSMENT AND PLAN  MULTIPLE SCLEROSIS  Gait disturbance  Depression with anxiety  Numbness  Other fatigue    1.   We will try to get her a site for Lemtrada infusion as soon as possible. 2.   We also discussed a backup plan if we cannot get at local site for [facility]. In that case, consider ocrelizumab or Zinbryta. Of those 2 she is more interested in ocrelizumab and she did go ahead and sign the enrollment forms.   I gave a prescription for Valtrex that she will take when she goes on [place].  She will not need to take that with the other medications. 3.   flomax for bladder.    4.   Continue other med's for for spasticity, mood, pain 5.    Return for infusion or call if any new or worsening neurologic symptoms per   Dawn Peters A. Felecia Shelling, MD, PhD [id number], [id number] AM Certified in Neurology, Clinical Neurophysiology, Sleep Medicine, Pain Medicine and Neuroimaging  [facility] [address] [place], [place] [address] [phone]

## 2015-07-09 ENCOUNTER — Telehealth: Payer: Self-pay | Admitting: Neurology

## 2015-07-09 DIAGNOSIS — G35 Multiple sclerosis: Secondary | ICD-10-CM

## 2015-07-09 MED ORDER — BUSPIRONE HCL 15 MG PO TABS: 15.0000 mg | ORAL_TABLET | Freq: Three times a day (TID) | ORAL | Status: DC

## 2015-07-09 NOTE — Telephone Encounter (Signed)
Calling medicationAlemtuzumab [facility]) 12 MG/1.2ML SOLN this is not cover by her insurance company. Pt want to  know about a different medication.Please call and advised [phone].

## 2015-07-09 NOTE — Telephone Encounter (Signed)
I have spoken with Dawn Peters.  She would like to proceed with Lemtrada infusions at [facility]. Referral for Dr. Erin Sons ordered, and I have let [name], with [facility] know pt. has been referred, so she can f/u on this.  Pt. also sts. she still has some anx./depression despite Celexa 20mg  qd.  Per [facility], ok to add Buspar 15mg  bid.  Rx. escribed to [facility] on [address] per pt's request./fim

## 2015-07-10 ENCOUNTER — Telehealth: Payer: Self-pay | Admitting: *Deleted

## 2015-07-10 MED ORDER — CITALOPRAM HYDROBROMIDE 20 MG PO TABS: ORAL_TABLET | ORAL | Status: DC

## 2015-07-10 NOTE — Telephone Encounter (Signed)
Celexa r/f per faxed request/fim

## 2015-07-17 ENCOUNTER — Encounter (HOSPITAL_COMMUNITY): Payer: Self-pay | Admitting: Emergency Medicine

## 2015-07-17 ENCOUNTER — Other Ambulatory Visit: Payer: Self-pay | Admitting: Neurology

## 2015-07-17 ENCOUNTER — Telehealth: Payer: Self-pay | Admitting: Neurology

## 2015-07-17 ENCOUNTER — Inpatient Hospital Stay (HOSPITAL_COMMUNITY)
Admission: EM | Admit: 2015-07-17 | Discharge: 2015-07-19 | DRG: 060 | Disposition: A | Discharge status: Home / Self Care | Payer: Medicare Other | Attending: Internal Medicine | Admitting: Internal Medicine

## 2015-07-17 ENCOUNTER — Inpatient Hospital Stay (HOSPITAL_COMMUNITY): Payer: Medicare Other

## 2015-07-17 DIAGNOSIS — F419 Anxiety disorder, unspecified: Secondary | ICD-10-CM | POA: Diagnosis present

## 2015-07-17 DIAGNOSIS — R296 Repeated falls: Secondary | ICD-10-CM | POA: Diagnosis present

## 2015-07-17 DIAGNOSIS — N289 Disorder of kidney and ureter, unspecified: Secondary | ICD-10-CM

## 2015-07-17 DIAGNOSIS — Z79899 Other long term (current) drug therapy: Secondary | ICD-10-CM

## 2015-07-17 DIAGNOSIS — R202 Paresthesia of skin: Secondary | ICD-10-CM | POA: Diagnosis present

## 2015-07-17 DIAGNOSIS — G35 Multiple sclerosis: Principal | ICD-10-CM | POA: Diagnosis present

## 2015-07-17 DIAGNOSIS — R3915 Urgency of urination: Secondary | ICD-10-CM | POA: Diagnosis present

## 2015-07-17 DIAGNOSIS — F329 Major depressive disorder, single episode, unspecified: Secondary | ICD-10-CM | POA: Diagnosis present

## 2015-07-17 DIAGNOSIS — I10 Essential (primary) hypertension: Secondary | ICD-10-CM | POA: Diagnosis present

## 2015-07-17 DIAGNOSIS — R200 Anesthesia of skin: Secondary | ICD-10-CM | POA: Diagnosis not present

## 2015-07-17 DIAGNOSIS — R269 Unspecified abnormalities of gait and mobility: Secondary | ICD-10-CM | POA: Diagnosis not present

## 2015-07-17 DIAGNOSIS — R531 Weakness: Secondary | ICD-10-CM | POA: Diagnosis present

## 2015-07-17 DIAGNOSIS — G35D Multiple sclerosis, unspecified: Secondary | ICD-10-CM | POA: Diagnosis present

## 2015-07-17 LAB — CBC WITH DIFFERENTIAL/PLATELET
BASOS ABS: 0 10*3/uL (ref 0.0–0.1)
BASOS PCT: 0 %
Eosinophils Absolute: 0.1 10*3/uL (ref 0.0–0.7)
Eosinophils Relative: 1 %
HEMATOCRIT: 36 % (ref 36.0–46.0)
HEMOGLOBIN: 12.5 g/dL (ref 12.0–15.0)
Lymphocytes Relative: 24 %
Lymphs Abs: 1 10*3/uL (ref 0.7–4.0)
MCH: 33.8 pg (ref 26.0–34.0)
MCHC: 34.7 g/dL (ref 30.0–36.0)
MCV: 97.3 fL (ref 78.0–100.0)
MONOS PCT: 15 %
Monocytes Absolute: 0.6 10*3/uL (ref 0.1–1.0)
NEUTROS ABS: 2.4 10*3/uL (ref 1.7–7.7)
NEUTROS PCT: 60 %
Platelets: 244 10*3/uL (ref 150–400)
RBC: 3.7 MIL/uL — AB (ref 3.87–5.11)
RDW: 14.6 % (ref 11.5–15.5)
WBC: 4 10*3/uL (ref 4.0–10.5)

## 2015-07-17 LAB — BASIC METABOLIC PANEL
ANION GAP: 9 (ref 5–15)
BUN: 19 mg/dL (ref 6–20)
CHLORIDE: 107 mmol/L (ref 101–111)
CO2: 30 mmol/L (ref 22–32)
Calcium: 9.1 mg/dL (ref 8.9–10.3)
Creatinine, Ser: 1.27 mg/dL — ABNORMAL HIGH (ref 0.44–1.00)
GFR calc non Af Amer: 53 mL/min — ABNORMAL LOW (ref >60)
Glucose, Bld: 97 mg/dL (ref 65–99)
POTASSIUM: 4.7 mmol/L (ref 3.5–5.1)
Sodium: 146 mmol/L — ABNORMAL HIGH (ref 135–145)

## 2015-07-17 LAB — URINALYSIS, ROUTINE W REFLEX MICROSCOPIC
BILIRUBIN URINE: NEGATIVE
GLUCOSE, UA: NEGATIVE mg/dL
HGB URINE DIPSTICK: NEGATIVE
KETONES UR: NEGATIVE mg/dL
Leukocytes, UA: NEGATIVE
NITRITE: NEGATIVE
PH: 5.5 (ref 5.0–8.0)
Protein, ur: NEGATIVE mg/dL
Specific Gravity, Urine: 1.017 (ref 1.005–1.030)

## 2015-07-17 LAB — I-STAT BETA HCG BLOOD, ED (MC, WL, AP ONLY)

## 2015-07-17 LAB — MRSA PCR SCREENING: MRSA by PCR: NEGATIVE

## 2015-07-17 MED ORDER — ONDANSETRON HCL 4 MG/2ML IJ SOLN: 4.0000 mg | Freq: Four times a day (QID) | INTRAMUSCULAR | Status: DC | PRN

## 2015-07-17 MED ORDER — GABAPENTIN 800 MG PO TABS
800.0000 mg | ORAL_TABLET | Freq: Three times a day (TID) | ORAL | Status: DC
  Filled 2015-07-17 (×3): qty 1

## 2015-07-17 MED ORDER — SODIUM CHLORIDE 0.9 % IV SOLN
INTRAVENOUS | Status: DC
  Administered 2015-07-17: 08:00:00 via INTRAVENOUS

## 2015-07-17 MED ORDER — BUSPIRONE HCL 15 MG PO TABS
15.0000 mg | ORAL_TABLET | Freq: Three times a day (TID) | ORAL | Status: DC
  Administered 2015-07-17 – 2015-07-19 (×7): 15 mg via ORAL
  Filled 2015-07-17 (×10): qty 1

## 2015-07-17 MED ORDER — SODIUM CHLORIDE 0.9 % IV SOLN
1000.0000 mg | Freq: Once | INTRAVENOUS | Status: AC
  Administered 2015-07-17: 1000 mg via INTRAVENOUS
  Filled 2015-07-17: qty 8

## 2015-07-17 MED ORDER — BACLOFEN 10 MG PO TABS
20.0000 mg | ORAL_TABLET | Freq: Three times a day (TID) | ORAL | Status: DC | PRN
  Administered 2015-07-18: 20 mg via ORAL
  Filled 2015-07-17: qty 2

## 2015-07-17 MED ORDER — CITALOPRAM HYDROBROMIDE 20 MG PO TABS
20.0000 mg | ORAL_TABLET | Freq: Every day | ORAL | Status: DC
  Administered 2015-07-17 – 2015-07-19 (×3): 20 mg via ORAL
  Filled 2015-07-17 (×3): qty 1

## 2015-07-17 MED ORDER — LORAZEPAM 2 MG/ML IJ SOLN
1.0000 mg | Freq: Once | INTRAMUSCULAR | Status: AC
  Administered 2015-07-17: 1 mg via INTRAVENOUS
  Filled 2015-07-17: qty 1

## 2015-07-17 MED ORDER — LISINOPRIL 20 MG PO TABS
20.0000 mg | ORAL_TABLET | Freq: Every day | ORAL | Status: DC
  Administered 2015-07-17 – 2015-07-19 (×3): 20 mg via ORAL
  Filled 2015-07-17 (×3): qty 1

## 2015-07-17 MED ORDER — ACETAMINOPHEN 325 MG PO TABS
650.0000 mg | ORAL_TABLET | Freq: Four times a day (QID) | ORAL | Status: DC | PRN
  Administered 2015-07-17 – 2015-07-19 (×3): 650 mg via ORAL
  Filled 2015-07-17 (×3): qty 2

## 2015-07-17 MED ORDER — NORGESTIM-ETH ESTRAD TRIPHASIC 0.18/0.215/0.25 MG-25 MCG PO TABS
1.0000 | ORAL_TABLET | Freq: Every day | ORAL | Status: DC
  Administered 2015-07-19: 1 via ORAL

## 2015-07-17 MED ORDER — SODIUM CHLORIDE 0.9 % IV SOLN
INTRAVENOUS | Status: DC
  Administered 2015-07-17: 13:00:00 via INTRAVENOUS
  Administered 2015-07-18 (×2): 1000 mL via INTRAVENOUS

## 2015-07-17 MED ORDER — OXYCODONE HCL 5 MG PO TABS
5.0000 mg | ORAL_TABLET | ORAL | Status: DC | PRN
  Administered 2015-07-17 – 2015-07-19 (×8): 5 mg via ORAL
  Filled 2015-07-17 (×8): qty 1

## 2015-07-17 MED ORDER — CETYLPYRIDINIUM CHLORIDE 0.05 % MT LIQD
7.0000 mL | Freq: Two times a day (BID) | OROMUCOSAL | Status: DC
  Administered 2015-07-17 – 2015-07-19 (×3): 7 mL via OROMUCOSAL

## 2015-07-17 MED ORDER — GABAPENTIN 400 MG PO CAPS
800.0000 mg | ORAL_CAPSULE | Freq: Three times a day (TID) | ORAL | Status: DC
  Administered 2015-07-17 – 2015-07-19 (×7): 800 mg via ORAL
  Filled 2015-07-17 (×7): qty 2

## 2015-07-17 MED ORDER — ONDANSETRON HCL 4 MG PO TABS: 4.0000 mg | ORAL_TABLET | Freq: Four times a day (QID) | ORAL | Status: DC | PRN

## 2015-07-17 MED ORDER — SODIUM CHLORIDE 0.9 % IV SOLN
1000.0000 mg | Freq: Every day | INTRAVENOUS | Status: DC
  Administered 2015-07-18: 1000 mg via INTRAVENOUS
  Filled 2015-07-17 (×2): qty 8

## 2015-07-17 MED ORDER — ENOXAPARIN SODIUM 40 MG/0.4ML ~~LOC~~ SOLN
40.0000 mg | SUBCUTANEOUS | Status: DC
  Administered 2015-07-17 – 2015-07-18 (×2): 40 mg via SUBCUTANEOUS
  Filled 2015-07-17 (×2): qty 0.4

## 2015-07-17 MED ORDER — LAMOTRIGINE 200 MG PO TABS
200.0000 mg | ORAL_TABLET | Freq: Two times a day (BID) | ORAL | Status: DC
  Administered 2015-07-17 – 2015-07-19 (×5): 200 mg via ORAL
  Filled 2015-07-17 (×8): qty 1

## 2015-07-17 MED ORDER — ACETAMINOPHEN 650 MG RE SUPP: 650.0000 mg | Freq: Four times a day (QID) | RECTAL | Status: DC | PRN

## 2015-07-17 MED ORDER — ENOXAPARIN SODIUM 40 MG/0.4ML ~~LOC~~ SOLN: 40.0000 mg | SUBCUTANEOUS | Status: DC

## 2015-07-17 MED ORDER — GADOBENATE DIMEGLUMINE 529 MG/ML IV SOLN
15.0000 mL | Freq: Once | INTRAVENOUS | Status: AC | PRN
  Administered 2015-07-17: 15 mL via INTRAVENOUS

## 2015-07-17 NOTE — Telephone Encounter (Signed)
Patient called, had few days history of gait difficulty, needs steroid, chart reviewed, she presented to ED on Jul 17 2015.

## 2015-07-17 NOTE — ED Notes (Signed)
MD at bedside. 

## 2015-07-17 NOTE — ED Notes (Signed)
Pt states she has MS and is very weak and has fallen 3 times on Wednesday  Pt is c/o bilateral arm pain  Pt states her son had to pick her up and carry her in the house

## 2015-07-17 NOTE — Telephone Encounter (Signed)
We can try to get her in next week

## 2015-07-17 NOTE — Progress Notes (Signed)
PT Cancellation Note  Patient Details Name: Dawn Peters MRN: [id number] DOB: December 01, 1980   Cancelled Treatment:    Reason Eval/Treat Not Completed: Patient at procedure or test/unavailablewill check back at another time   Claretha Cooper 07/17/2015, 1:42 PM Tresa Endo PT [phone]

## 2015-07-17 NOTE — H&P (Signed)
History and Physical    KAYLI MILESKI [id number] DOB: September 08, 1980 DOA: 07/17/2015  Referring MD/NP/PA:  PCP: [name], MD  Outpatient Specialists: Neurology Patient coming from: Home  Chief Complaint: Weakness/Falls  HPI: Dawn Peters is a 35 y.o. female with medical history significant of multiple sclerosis who follows Dr. Felecia Shelling at [facility], having previous multiple sclerosis flareups, treated with Tecfidera in the past, currently in the process of getting started on Lemtrada, presenting to the emergency department with complaints of extremity weakness. She states having upper extremity weakness yesterday evening having difficulty feeding herself and dropping objects. This was associated with paresthesias to bilateral arms. She also reported having significant back pain from cervical region down to her lower back. Overnight she has experienced multiple falls experiencing significant weakness to bilateral lower extremities as well. She reports that current symptoms are similar to presentation of prior multiple sclerosis flareups.  ED Course: In the emergency room she was seen and evaluated by Dr. [name] of neurology recommending Solu-Medrol 1000 mg IV. I spoke with Dr. Leonel Ramsay who was comfortable with patient staying at [facility], and would be by tomorrow to reassess patient.  Review of Systems: As per HPI otherwise 10 point review of systems negative.    Past Medical History  Diagnosis Date  . MS (multiple sclerosis) ([place])   . Hypertension   . Anxiety   . Depression     Past Surgical History  Procedure Laterality Date  . Bilateral hip arthroscopy Left 07/2013  . Joint replacement Bilateral      R 2004 and L 2005     reports that she has never smoked. She has never used smokeless tobacco. She reports that she does not drink alcohol or use illicit drugs.  No Known Allergies  Family History  Problem Relation Age of Onset  . Breast  cancer      paternal grandmother dx age 62  . Colon cancer      paternal grandmother  . Healthy Mother   . Healthy Father     Prior to Admission medications   Medication Sig Start Date End Date Taking? Authorizing Provider  baclofen (LIORESAL) 20 MG tablet Take 1 tablet (20 mg total) by mouth 3 (three) times daily as needed (for ms). 05/13/15  Yes Britt Bottom, MD  busPIRone (BUSPAR) 15 MG tablet Take 1 tablet (15 mg total) by mouth 3 (three) times daily. 07/09/15  Yes Britt Bottom, MD  citalopram (CELEXA) 20 MG tablet Take one tablet by mouth daily. 07/10/15  Yes Britt Bottom, MD  diclofenac (VOLTAREN) 75 MG EC tablet Take 1 tablet (75 mg total) by mouth 2 (two) times daily. 05/13/15  Yes Britt Bottom, MD  gabapentin (NEURONTIN) 800 MG tablet Take 1 tablet (800 mg total) by mouth 3 (three) times daily. 05/13/15  Yes Britt Bottom, MD  lamoTRIgine (LAMICTAL) 200 MG tablet Take 1 tablet (200 mg total) by mouth 2 (two) times daily. 05/13/15  Yes Britt Bottom, MD  lisinopril (PRINIVIL,ZESTRIL) 20 MG tablet Take 1 tablet (20 mg total) by mouth daily. 05/13/15  Yes Britt Bottom, MD  Norgestimate-Ethinyl Estradiol Triphasic 0.18/0.215/0.25 MG-25 MCG tab Take 1 tablet by mouth daily. 12/18/14  Yes Woodroe Mode, MD  SUMAtriptan (IMITREX) 100 MG tablet Take 1 tablet (100 mg total) by mouth once as needed for migraine. 05/13/15  Yes Britt Bottom, MD  tiZANidine (ZANAFLEX) 4 MG tablet Take 1 tablet (4 mg total) by  mouth at bedtime. 05/13/15  Yes Britt Bottom, MD  traMADol (ULTRAM-ER) 300 MG 24 hr tablet Take 1 tablet (300 mg total) by mouth daily. Patient taking differently: Take 300 mg by mouth daily as needed for pain.  05/13/15  Yes Britt Bottom, MD  valACYclovir (VALTREX) 500 MG tablet Take 1 tablet (500 mg total) by mouth 2 (two) times daily. 06/30/15  Yes Britt Bottom, MD  Alemtuzumab (LEMTRADA) 12 MG/1.2ML SOLN Inject 12 mg into the vein. Reported on 07/17/2015    Historical Provider,  MD  diazepam (VALIUM) 5 MG tablet Take one or two pills before MRI Patient not taking: Reported on 07/17/2015 05/20/15   Britt Bottom, MD  LORazepam (ATIVAN) 1 MG tablet Take 1 tablet (1 mg total) by mouth 2 (two) times daily as needed for anxiety. Patient not taking: Reported on 07/17/2015 03/10/14   [name], MD  methocarbamol (ROBAXIN) 500 MG tablet Take 1-2 tablets (500-1,000 mg total) by mouth every 6 (six) hours as needed for muscle spasms. Patient not taking: Reported on 07/17/2015 09/24/14   Waldemar Dickens, MD  tamsulosin (FLOMAX) 0.4 MG CAPS capsule Take 1 capsule (0.4 mg total) by mouth daily. Patient not taking: Reported on 07/17/2015 06/30/15   Britt Bottom, MD    Physical Exam: Filed Vitals:   07/17/15 0203 07/17/15 0500 07/17/15 0707  BP: 134/88 128/88 135/97  Pulse: 107 82 83  Temp: 98.4 F (36.9 C)    TempSrc: Oral    Resp: 18 16 16   SpO2: 100% 100% 96%      Constitutional: NAD, calm, comfortable Filed Vitals:   07/17/15 0203 07/17/15 0500 07/17/15 0707  BP: 134/88 128/88 135/97  Pulse: 107 82 83  Temp: 98.4 F (36.9 C)    TempSrc: Oral    Resp: 18 16 16   SpO2: 100% 100% 96%   Eyes: PERRL, lids and conjunctivae normal ENMT: Mucous membranes are moist. Posterior pharynx clear of any exudate or lesions.Normal dentition.  Neck: normal, supple, no masses, no thyromegaly Respiratory: clear to auscultation bilaterally, no wheezing, no crackles. Normal respiratory effort. No accessory muscle use.  Cardiovascular: Regular rate and rhythm, no murmurs / rubs / gallops. No extremity edema. 2+ pedal pulses. No carotid bruits.  Abdomen: no tenderness, no masses palpated. No hepatosplenomegaly. Bowel sounds positive.  Musculoskeletal: no clubbing / cyanosis. No joint deformity upper and lower extremities. Good ROM, no contractures. Normal muscle tone.  Skin: no rashes, lesions, ulcers. No induration Neurologic: On my evaluation she has 5 of 5 muscle strength to  bilateral upper extremities without alteration to sensation. She has 3-5 muscle strength to her left lower extremity and 45 muscle strength to her right lower extremity without alteration to sensation. Psychiatric: Normal judgment and insight. Alert and oriented x 3. Normal mood.    Labs on Admission: I have personally reviewed following labs and imaging studies  CBC:  Recent Labs Lab 07/17/15 0459  WBC 4.0  NEUTROABS 2.4  HGB 12.5  HCT 36.0  MCV 97.3  PLT [id number]   Basic Metabolic Panel:  Recent Labs Lab 07/17/15 0459  NA 146*  K 4.7  CL 107  CO2 30  GLUCOSE 97  BUN 19  CREATININE 1.27*  CALCIUM 9.1   GFR: CrCl cannot be calculated (Unknown ideal weight.). Liver Function Tests: No results for input(s): AST, ALT, ALKPHOS, BILITOT, PROT, ALBUMIN in the last 168 hours. No results for input(s): LIPASE, AMYLASE in the last 168 hours. No results for  input(s): AMMONIA in the last 168 hours. Coagulation Profile: No results for input(s): INR, PROTIME in the last 168 hours. Cardiac Enzymes: No results for input(s): CKTOTAL, CKMB, CKMBINDEX, TROPONINI in the last 168 hours. BNP (last 3 results) No results for input(s): PROBNP in the last 8760 hours. HbA1C: No results for input(s): HGBA1C in the last 72 hours. CBG: No results for input(s): GLUCAP in the last 168 hours. Lipid Profile: No results for input(s): CHOL, HDL, LDLCALC, TRIG, CHOLHDL, LDLDIRECT in the last 72 hours. Thyroid Function Tests: No results for input(s): TSH, T4TOTAL, FREET4, T3FREE, THYROIDAB in the last 72 hours. Anemia Panel: No results for input(s): VITAMINB12, FOLATE, FERRITIN, TIBC, IRON, RETICCTPCT in the last 72 hours. Urine analysis:    Component Value Date/Time   COLORURINE YELLOW 07/17/2015 0442   APPEARANCEUR CLOUDY* 07/17/2015 0442   LABSPEC 1.017 07/17/2015 0442   PHURINE 5.5 07/17/2015 0442   GLUCOSEU NEGATIVE 07/17/2015 0442   HGBUR NEGATIVE 07/17/2015 0442   BILIRUBINUR NEGATIVE  07/17/2015 0442   BILIRUBINUR neg 09/02/2013 1115   KETONESUR NEGATIVE 07/17/2015 0442   PROTEINUR NEGATIVE 07/17/2015 0442   PROTEINUR neg 09/02/2013 1115   UROBILINOGEN 1.0 11/01/2013 1015   UROBILINOGEN 0.2 09/02/2013 1115   NITRITE NEGATIVE 07/17/2015 0442   NITRITE neg 09/02/2013 1115   LEUKOCYTESUR NEGATIVE 07/17/2015 0442   Sepsis Labs: @LABRCNTIP (procalcitonin:4,lacticidven:4) )No results found for this or any previous visit (from the past 240 hour(s)).   Radiological Exams on Admission: No results found.  EKG: Independently reviewed.   Assessment/Plan Principal Problem:   Multiple sclerosis exacerbation (HCC) Active Problems:   Essential hypertension   Gait disturbance   Numbness  1.  Multiple sclerosis exacerbation. Ms. Crombie is a pleasant 35 year old female with a history of multiple sclerosis, having a history of MS exacerbations, who [facility]. Her last planned with neurology was on 06/11/2015. She presents with recurrent falls and extremity weakness. She was seen by neurology in the emergency department recommending Solu-Medrol 1000 mg IV daily 3 days, receiving her first dose in the ER. Will obtain MRI of thoracic spine with and without contrast as recommended by neurology. Case discussed with Dr. Leonel Ramsay who was comfortable with patient staying at [facility] Will admit to [place]. Physical therapy consultation placed. Continue baclofen 20 mg 3 times a day when necessary for spasticity and gabapentin 800 mg 3 times a day. Await further recommendations from neurology.  2.  Hypertension. Plan to continue lisinopril 20 mg by mouth daily. Blood pressure stable in the emergency department.  3.  Anxiety. Continue BuSpar and Celexa   DVT prophylaxis: Lovenox Code Status: Full code Family Communication:  Disposition Plan: Anticipate she will require greater than 2 nights hospitalization Consults called: Neurology,  Dr. Leonel Ramsay Admission status: Admit to inpatient service, MedSurg   Kelvin Cellar MD [facility] Pager [phone]  If 7PM-7AM, please contact night-coverage [email or web address] Password [facility]  07/17/2015, 8:43 AM

## 2015-07-17 NOTE — Telephone Encounter (Signed)
LMOM (identified vm) requesting pt. call for hospital f/u with RAS once she is d/c from the hospital/fim

## 2015-07-17 NOTE — ED Provider Notes (Signed)
CSN: [id number]     Arrival date & time 07/17/15  0155 History   First MD Initiated Contact with Patient 07/17/15 0410     Chief Complaint  Patient presents with  . Fall     (Consider location/radiation/quality/duration/timing/severity/associated sxs/prior Treatment) Patient is a 34 y.o. female presenting with fall. The history is provided by the patient.  Fall  She Has a history of multiple sclerosis with ongoing gait disturbance. However, over the last 24 hours, she has felt increased weakness in her left arm and left leg to the point where she is unable to walk. She has fallen 3 times and cannot hold things in her left arm. In the past, she is then treated with steroid bursts.  Past Medical History  Diagnosis Date  . MS (multiple sclerosis) ([place])   . Hypertension   . Anxiety   . Depression    Past Surgical History  Procedure Laterality Date  . Bilateral hip arthroscopy Left 07/2013  . Joint replacement Bilateral      R 2004 and L 2005   Family History  Problem Relation Age of Onset  . Breast cancer      paternal grandmother dx age 49  . Colon cancer      paternal grandmother  . Healthy Mother   . Healthy Father    Social History  Substance Use Topics  . Smoking status: Never Smoker   . Smokeless tobacco: Never Used  . Alcohol Use: No   OB History    Gravida Para Term Preterm AB TAB SAB Ectopic Multiple Living   1 1 1       1      Review of Systems  All other systems reviewed and are negative.     Allergies  Review of patient's allergies indicates no known allergies.  Home Medications   Prior to Admission medications   Medication Sig Start Date End Date Taking? Authorizing Provider  Alemtuzumab (LEMTRADA) 12 MG/1.2ML SOLN Inject 12 mg into the vein. Reported on 06/11/2015    Historical Provider, MD  baclofen (LIORESAL) 20 MG tablet Take 1 tablet (20 mg total) by mouth 3 (three) times daily as needed (for ms). 05/13/15   Britt Bottom, MD  busPIRone  (BUSPAR) 15 MG tablet Take 1 tablet (15 mg total) by mouth 3 (three) times daily. 07/09/15   Britt Bottom, MD  citalopram (CELEXA) 20 MG tablet Take one tablet by mouth daily. 07/10/15   Britt Bottom, MD  diazepam (VALIUM) 5 MG tablet Take one or two pills before MRI 05/20/15   Britt Bottom, MD  diclofenac (VOLTAREN) 75 MG EC tablet Take 1 tablet (75 mg total) by mouth 2 (two) times daily. 05/13/15   Britt Bottom, MD  gabapentin (NEURONTIN) 800 MG tablet Take 1 tablet (800 mg total) by mouth 3 (three) times daily. 05/13/15   Britt Bottom, MD  lamoTRIgine (LAMICTAL) 200 MG tablet Take 1 tablet (200 mg total) by mouth 2 (two) times daily. 05/13/15   Britt Bottom, MD  lisinopril (PRINIVIL,ZESTRIL) 20 MG tablet Take 1 tablet (20 mg total) by mouth daily. 05/13/15   Britt Bottom, MD  LORazepam (ATIVAN) 1 MG tablet Take 1 tablet (1 mg total) by mouth 2 (two) times daily as needed for anxiety. 03/10/14   [name], MD  methocarbamol (ROBAXIN) 500 MG tablet Take 1-2 tablets (500-1,000 mg total) by mouth every 6 (six) hours as needed for muscle spasms. 09/24/14   Grayling Congress  Marily Memos, MD  Norgestimate-Ethinyl Estradiol Triphasic 0.18/0.215/0.25 MG-25 MCG tab Take 1 tablet by mouth daily. 12/18/14   Woodroe Mode, MD  SUMAtriptan (IMITREX) 100 MG tablet Take 1 tablet (100 mg total) by mouth once as needed for migraine. 05/13/15   Britt Bottom, MD  tamsulosin (FLOMAX) 0.4 MG CAPS capsule Take 1 capsule (0.4 mg total) by mouth daily. 06/30/15   Britt Bottom, MD  tiZANidine (ZANAFLEX) 4 MG tablet Take 1 tablet (4 mg total) by mouth at bedtime. 05/13/15   Britt Bottom, MD  traMADol (ULTRAM-ER) 300 MG 24 hr tablet Take 1 tablet (300 mg total) by mouth daily. 05/13/15   Britt Bottom, MD  valACYclovir (VALTREX) 500 MG tablet Take 1 tablet (500 mg total) by mouth 2 (two) times daily. 06/30/15   Britt Bottom, MD   BP 134/88 mmHg  Pulse 107  Temp(Src) 98.4 F (36.9 C) (Oral)  Resp 18  SpO2 100%  LMP  06/25/2015 (Approximate) Physical Exam  Nursing note and vitals reviewed.  35 year old female, resting comfortably and in no acute distress. Vital signs are significant for tachycardia. Oxygen saturation is 100%, which is normal. Head is normocephalic and atraumatic. PERRLA, EOMI. Oropharynx is clear. Neck is nontender and supple without adenopathy or JVD. Back is nontender and there is no CVA tenderness. Lungs are clear without rales, wheezes, or rhonchi. Chest is nontender. Heart has regular rate and rhythm without murmur. Abdomen is soft, flat, nontender without masses or hepatosplenomegaly and peristalsis is normoactive. Extremities have no cyanosis or edema, full range of motion is present. Skin is warm and dry without rash. Neurologic: Mental status is normal, cranial nerves are intact. There is mild weakness of the left arm with strength 4/5, and marked weakness of the left leg with strength 1/5. There are 2-3 beats of right ankle clonus and constant left ankle clonus. Right arm and right leg strength are 5/5.  ED Course  Procedures (including critical care time) Labs Review Results for orders placed or performed during the hospital encounter of 07/17/15  CBC with Differential  Result Value Ref Range   WBC 4.0 4.0 - 10.5 K/uL   RBC 3.70 (L) 3.87 - 5.11 MIL/uL   Hemoglobin 12.5 12.0 - 15.0 g/dL   HCT 36.0 36.0 - 46.0 %   MCV 97.3 78.0 - 100.0 fL   MCH 33.8 26.0 - 34.0 pg   MCHC 34.7 30.0 - 36.0 g/dL   RDW 14.6 11.5 - 15.5 %   Platelets 244 150 - 400 K/uL   Neutrophils Relative % 60 %   Neutro Abs 2.4 1.7 - 7.7 K/uL   Lymphocytes Relative 24 %   Lymphs Abs 1.0 0.7 - 4.0 K/uL   Monocytes Relative 15 %   Monocytes Absolute 0.6 0.1 - 1.0 K/uL   Eosinophils Relative 1 %   Eosinophils Absolute 0.1 0.0 - 0.7 K/uL   Basophils Relative 0 %   Basophils Absolute 0.0 0.0 - 0.1 K/uL  Basic metabolic panel  Result Value Ref Range   Sodium 146 (H) 135 - 145 mmol/L   Potassium 4.7  3.5 - 5.1 mmol/L   Chloride 107 101 - 111 mmol/L   CO2 30 22 - 32 mmol/L   Glucose, Bld 97 65 - 99 mg/dL   BUN 19 6 - 20 mg/dL   Creatinine, Ser 1.27 (H) 0.44 - 1.00 mg/dL   Calcium 9.1 8.9 - 10.3 mg/dL   GFR calc non Af Amer 53 (  L) >60 mL/min   GFR calc Af Amer >60 >60 mL/min   Anion gap 9 5 - 15  Urinalysis, Routine w reflex microscopic  Result Value Ref Range   Color, Urine YELLOW YELLOW   APPearance CLOUDY (A) CLEAR   Specific Gravity, Urine 1.017 1.005 - 1.030   pH 5.5 5.0 - 8.0   Glucose, UA NEGATIVE NEGATIVE mg/dL   Hgb urine dipstick NEGATIVE NEGATIVE   Bilirubin Urine NEGATIVE NEGATIVE   Ketones, ur NEGATIVE NEGATIVE mg/dL   Protein, ur NEGATIVE NEGATIVE mg/dL   Nitrite NEGATIVE NEGATIVE   Leukocytes, UA NEGATIVE NEGATIVE  I-Stat beta hCG blood, ED  Result Value Ref Range   I-stat hCG, quantitative <5.0 <5 mIU/mL   Comment 3           I have personally reviewed and evaluated these images and lab results as part of my medical decision-making.   MDM   Final diagnoses:  Exacerbation of multiple sclerosis ([place])  Renal insufficiency    Exacerbation of multiple sclerosis. Old records of been reviewed and she has been followed by neurology for multiple sclerosis with gait disturbance but axial was noted to be doing better at her last visit several weeks ago. I have discussed the case with Dr. [name] of neurology service who recommends methylprednisolone 1 g and hospital admission. Case is discussed with Dr. Tamala Julian of [facility] who agrees to admit the patient with neurology consultation.    Delora Fuel, MD [id number]

## 2015-07-17 NOTE — Consult Note (Signed)
Admission H&P    Chief Complaint: MS exacerbation.  HPI: Dawn Peters is an 35 y.o. female with a history of multiple sclerosis, hypertension, depression and anxiety, presenting with increasing weakness of lower extremities and back pain for 2 days. She is unable to ambulate at this point and has fallen several times. She has chronic urinary urgency which is unchanged. She has not experienced sensory changes involving lower extremities. She's had a slight tingling sensation in both hands but no upper extremity symptoms otherwise. She is currently on no specific maintenance therapy for MS. Her last exacerbation was about one month ago with similar presentation, but weakness was more widespread.  Past Medical History  Diagnosis Date  . MS (multiple sclerosis) ([place])   . Hypertension   . Anxiety   . Depression     Past Surgical History  Procedure Laterality Date  . Bilateral hip arthroscopy Left 07/2013  . Joint replacement Bilateral      R 2004 and L 2005    Family History  Problem Relation Age of Onset  . Breast cancer      paternal grandmother dx age 67  . Colon cancer      paternal grandmother  . Healthy Mother   . Healthy Father    Social History:  reports that she has never smoked. She has never used smokeless tobacco. She reports that she does not drink alcohol or use illicit drugs.  Allergies: No Known Allergies  Medications: Preadmission medications were reviewed by me.  ROS: History obtained from the patient  General ROS: negative for - chills, fatigue, fever, night sweats, weight gain or weight loss Psychological ROS: negative for - behavioral disorder, hallucinations, memory difficulties, mood swings or suicidal ideation Ophthalmic ROS: negative for - blurry vision, double vision, eye pain or loss of vision ENT ROS: negative for - epistaxis, nasal discharge, oral lesions, sore throat, tinnitus or vertigo Allergy and Immunology ROS: negative for - hives or  itchy/watery eyes Hematological and Lymphatic ROS: negative for - bleeding problems, bruising or swollen lymph nodes Endocrine ROS: negative for - galactorrhea, hair pattern changes, polydipsia/polyuria or temperature intolerance Respiratory ROS: negative for - cough, hemoptysis, shortness of breath or wheezing Cardiovascular ROS: negative for - chest pain, dyspnea on exertion, edema or irregular heartbeat Gastrointestinal ROS: negative for - abdominal pain, diarrhea, hematemesis, nausea/vomiting or stool incontinence Genito-Urinary ROS: negative for - dysuria, hematuria, incontinence or urinary frequency/urgency Musculoskeletal ROS: negative for - joint swelling or muscular weakness Neurological ROS: as noted in HPI Dermatological ROS: negative for rash and skin lesion changes  Physical Examination: Blood pressure 135/97, pulse 83, temperature 98.4 F (36.9 C), temperature source Oral, resp. rate 16, last menstrual period 06/25/2015, SpO2 96 %.  HEENT-  Normocephalic, no lesions, without obvious abnormality.  Normal external eye and conjunctiva.  Normal TM's bilaterally.  Normal auditory canals and external ears. Normal external nose, mucus membranes and septum.  Normal pharynx. Neck supple with no masses, nodes, nodules or enlargement. Cardiovascular - regular rate and rhythm, S1, S2 normal, no murmur, click, rub or gallop Lungs - chest clear, no wheezing, rales, normal symmetric air entry Abdomen - soft, non-tender; bowel sounds normal; no masses,  no organomegaly Extremities - no joint deformities, effusion, or inflammation and no edema  Neurologic Examination: Mental Status: Alert, oriented, thought content appropriate.  Speech fluent without evidence of aphasia. Able to follow commands without difficulty. Cranial Nerves: II-Visual fields were normal. III/IV/VI-Pupils were equal and reacted normally to light. Extraocular movements  were full and conjugate.    V/VII-no facial numbness  and no facial weakness. VIII-normal. X-normal speech and symmetrical palatal movement. XI: trapezius strength/neck flexion strength normal bilaterally XII-midline tongue extension with normal strength. Motor: 5/5 bilaterally both upper extremities proximally and distally; severe weakness with 2/5 strength of hip flexors, 4+/5 quadriceps and 4-/5 strength of hamstrings on the left; right lower extremity was normal except for 4/5 strength of hip flexors. Strength of distal lower extremities was normal. Sensory: Normal throughout. Deep Tendon Reflexes: Deep tendon reflexes were asymmetric with greater responses elicited from left extremities compared to right extremities. Plantars: Mute bilaterally Cerebellar: Moderate coordination difficulty of both upper extremities with finger-nose testing.  Results for orders placed or performed during the hospital encounter of 07/17/15 (from the past 48 hour(s))  Urinalysis, Routine w reflex microscopic     Status: Abnormal   Collection Time: 07/17/15  4:42 AM  Result Value Ref Range   Color, Urine YELLOW YELLOW   APPearance CLOUDY (A) CLEAR   Specific Gravity, Urine 1.017 1.005 - 1.030   pH 5.5 5.0 - 8.0   Glucose, UA NEGATIVE NEGATIVE mg/dL   Hgb urine dipstick NEGATIVE NEGATIVE   Bilirubin Urine NEGATIVE NEGATIVE   Ketones, ur NEGATIVE NEGATIVE mg/dL   Protein, ur NEGATIVE NEGATIVE mg/dL   Nitrite NEGATIVE NEGATIVE   Leukocytes, UA NEGATIVE NEGATIVE    Comment: MICROSCOPIC NOT DONE ON URINES WITH NEGATIVE PROTEIN, BLOOD, LEUKOCYTES, NITRITE, OR GLUCOSE <1000 mg/dL.  CBC with Differential     Status: Abnormal   Collection Time: 07/17/15  4:59 AM  Result Value Ref Range   WBC 4.0 4.0 - 10.5 K/uL   RBC 3.70 (L) 3.87 - 5.11 MIL/uL   Hemoglobin 12.5 12.0 - 15.0 g/dL   HCT 36.0 36.0 - 46.0 %   MCV 97.3 78.0 - 100.0 fL   MCH 33.8 26.0 - 34.0 pg   MCHC 34.7 30.0 - 36.0 g/dL   RDW 14.6 11.5 - 15.5 %   Platelets 244 150 - 400 K/uL   Neutrophils  Relative % 60 %   Neutro Abs 2.4 1.7 - 7.7 K/uL   Lymphocytes Relative 24 %   Lymphs Abs 1.0 0.7 - 4.0 K/uL   Monocytes Relative 15 %   Monocytes Absolute 0.6 0.1 - 1.0 K/uL   Eosinophils Relative 1 %   Eosinophils Absolute 0.1 0.0 - 0.7 K/uL   Basophils Relative 0 %   Basophils Absolute 0.0 0.0 - 0.1 K/uL  Basic metabolic panel     Status: Abnormal   Collection Time: 07/17/15  4:59 AM  Result Value Ref Range   Sodium 146 (H) 135 - 145 mmol/L   Potassium 4.7 3.5 - 5.1 mmol/L   Chloride 107 101 - 111 mmol/L   CO2 30 22 - 32 mmol/L   Glucose, Bld 97 65 - 99 mg/dL   BUN 19 6 - 20 mg/dL   Creatinine, Ser 1.27 (H) 0.44 - 1.00 mg/dL   Calcium 9.1 8.9 - 10.3 mg/dL   GFR calc non Af Amer 53 (L) >60 mL/min   GFR calc Af Amer >60 >60 mL/min    Comment: (NOTE) The eGFR has been calculated using the CKD EPI equation. This calculation has not been validated in all clinical situations. eGFR's persistently <60 mL/min signify possible Chronic Kidney Disease.    Anion gap 9 5 - 15  I-Stat beta hCG blood, ED     Status: None   Collection Time: 07/17/15  5:04 AM  Result Value Ref Range   I-stat hCG, quantitative <5.0 <5 mIU/mL   Comment 3            Comment:   GEST. AGE      CONC.  (mIU/mL)   <=1 WEEK        5 - 50     2 WEEKS       50 - 500     3 WEEKS       100 - 10,000     4 WEEKS     1,000 - 30,000        FEMALE AND NON-PREGNANT FEMALE:     LESS THAN 5 mIU/mL    No results found.  Assessment/Plan 35 year old lady with recurrent multiple sclerosis exacerbation acutely with probable involvement of thoracic spinal cord.  Recommendations: 1. Solu-Medrol 1000 mg per day 3 IV 2. Physical therapy consult 3. Thoracic spine MRI without and with contrast  We will continue to follow this patient with you.  C.R. [name], MD [facility] [phone]  07/17/2015, 7:13 AM

## 2015-07-17 NOTE — ED Notes (Signed)
Hospitalist at bedside 

## 2015-07-17 NOTE — Telephone Encounter (Signed)
Message For: ON CALL          (07)     Taken 11-MAY-17 at  9:37PM by [facility] Delivered 11-MAY-17 at 10:05PM by SMD to                ------------------------------------------------------------  Dawn Peters             CID  [id number]   Patient  SAME                  Pt's Dr  Felecia Shelling         Area Code  336  Phone#  [phone] *  DOB  [birth year]       RE  CAN BARELY WALK, HAVING RELAPSE-HAS MULTIPLE      SCLEROSIS                                             Disp:Y/N  Y  If Y = C/B If No Response In 69minutes

## 2015-07-18 DIAGNOSIS — G35 Multiple sclerosis: Principal | ICD-10-CM

## 2015-07-18 DIAGNOSIS — R269 Unspecified abnormalities of gait and mobility: Secondary | ICD-10-CM

## 2015-07-18 DIAGNOSIS — I10 Essential (primary) hypertension: Secondary | ICD-10-CM

## 2015-07-18 LAB — CBC
HCT: 35.5 % — ABNORMAL LOW (ref 36.0–46.0)
HEMOGLOBIN: 12.3 g/dL (ref 12.0–15.0)
MCH: 33.5 pg (ref 26.0–34.0)
MCHC: 34.6 g/dL (ref 30.0–36.0)
MCV: 96.7 fL (ref 78.0–100.0)
PLATELETS: 227 10*3/uL (ref 150–400)
RBC: 3.67 MIL/uL — ABNORMAL LOW (ref 3.87–5.11)
RDW: 14.4 % (ref 11.5–15.5)
WBC: 12.3 10*3/uL — ABNORMAL HIGH (ref 4.0–10.5)

## 2015-07-18 LAB — BASIC METABOLIC PANEL
Anion gap: 10 (ref 5–15)
BUN: 13 mg/dL (ref 6–20)
CALCIUM: 9 mg/dL (ref 8.9–10.3)
CO2: 24 mmol/L (ref 22–32)
CREATININE: 1.1 mg/dL — AB (ref 0.44–1.00)
Chloride: 109 mmol/L (ref 101–111)
GFR calc Af Amer: >60 mL/min (ref >60)
GFR calc non Af Amer: >60 mL/min (ref >60)
Glucose, Bld: 125 mg/dL — ABNORMAL HIGH (ref 65–99)
Potassium: 4.1 mmol/L (ref 3.5–5.1)
Sodium: 143 mmol/L (ref 135–145)

## 2015-07-18 MED ORDER — TIZANIDINE HCL 4 MG PO TABS
4.0000 mg | ORAL_TABLET | Freq: Every day | ORAL | Status: DC
  Administered 2015-07-18 (×2): 4 mg via ORAL
  Filled 2015-07-18 (×4): qty 1

## 2015-07-18 MED ORDER — HYDRALAZINE HCL 20 MG/ML IJ SOLN
10.0000 mg | Freq: Once | INTRAMUSCULAR | Status: AC
  Administered 2015-07-18: 10 mg via INTRAVENOUS
  Filled 2015-07-18: qty 1

## 2015-07-18 MED ORDER — ZOLPIDEM TARTRATE 5 MG PO TABS
5.0000 mg | ORAL_TABLET | Freq: Every evening | ORAL | Status: DC | PRN
  Administered 2015-07-18: 5 mg via ORAL
  Filled 2015-07-18: qty 1

## 2015-07-18 MED ORDER — SODIUM CHLORIDE 0.9 % IV SOLN
1000.0000 mg | Freq: Every day | INTRAVENOUS | Status: AC
  Administered 2015-07-19: 1000 mg via INTRAVENOUS
  Filled 2015-07-18: qty 8

## 2015-07-18 NOTE — Progress Notes (Signed)
PROGRESS NOTE    Dawn Peters  [id number] DOB: [birth year] DOA: 07/17/2015 PCP: [name], MD  Outpatient Specialists:     Brief Narrative:  35 y.o. female with medical history significant of multiple sclerosis who follows Dr. Felecia Shelling at [facility], having previous multiple sclerosis flareups, treated with Tecfidera in the past, currently in the process of getting started on [place]tr[place], presenting to the emergency department with complaints of extremity weakness. She states having upper extremity weakness yesterday evening having difficulty feeding herself and dropping objects. This was associated with paresthesias to bilateral arms. She also reported having significant back pain from cervical region down to her lower back. Overnight she has experienced multiple falls experiencing significant weakness to bilateral lower extremities as well. She reports that current symptoms are similar to presentation of prior multiple sclerosis flareups   Assessment & Plan:  Principal Problem:   Multiple sclerosis exacerbation ([place]) Active Problems:   Essential hypertension   Gait disturbance   Numbness   1. Multiple sclerosis exacerbation. Patient follows Dr.Sater w/ [facility]. Her last planned with neurology was on 06/11/2015. She presents with recurrent falls and extremity weakness. She was seen by neurology in the emergency department recommending Solu-Medrol 1000 mg IV daily 3 days, receiving her first dose in the ER. Physical therapy consultation placed. Continue baclofen 20 mg 3 times a day when necessary for spasticity and gabapentin 800 mg 3 times a day. - This AM, pt reports feeling overall improved.   2. Hypertension. Plan to continue lisinopril 20 mg by mouth daily. Blood pressure stable  3. Anxiety. Continue BuSpar and Celexa   DVT prophylaxis: Lovenox subQ Code Status: Full Family Communication: Pt in room, family at  bedside Disposition Plan: Possible home in 24hrs   Consultants:   Neurology  Procedures:     Antimicrobials:       Subjective: Feels much better today.   Objective: Filed Vitals:   07/17/15 1747 07/17/15 2145 07/18/15 0020 07/18/15 0558  BP: 134/95 167/101 142/70 146/98  Pulse: 95 78 80 69  Temp: 98.6 F (37 C) 98.6 F (37 C)  98.5 F (36.9 C)  TempSrc: Oral Oral  Oral  Resp: 18 18  16   Height:      Weight:      SpO2: 100% 100%  100%    Intake/Output Summary (Last 24 hours) at 07/18/15 1522 Last data filed at 07/18/15 0630  Gross per 24 hour  Intake   1440 ml  Output      0 ml  Net   1440 ml   Filed Weights   07/17/15 0939  Weight: 71.668 kg (158 lb)    Examination: General exam: Appears calm and comfortable  Respiratory system: Clear to auscultation. Respiratory effort normal. Cardiovascular system: S1 & S2 heard, RRR. No pedal edema. Gastrointestinal system: Abdomen is nondistended, soft and nontender. No organomegaly or masses felt. Normal bowel sounds heard. Central nervous system: Alert and oriented. BLE 4/5 weakness Extremities: Symmetric 5 x 5 power. Skin: No rashes, lesions or ulcers Psychiatry: Judgement and insight appear normal. Mood & affect appropriate.    Data Reviewed: I have personally reviewed following labs and imaging studies  CBC:  Recent Labs Lab 07/17/15 0459 07/18/15 0340  WBC 4.0 12.3*  NEUTROABS 2.4  --   HGB 12.5 12.3  HCT 36.0 35.5*  MCV 97.3 96.7  PLT 244 [id number]   Basic Metabolic Panel:  Recent Labs Lab 07/17/15 0459 07/18/15 0340  NA 146* 143  K 4.7 4.1  CL 107 109  CO2 30 24  GLUCOSE 97 125*  BUN 19 13  CREATININE 1.27* 1.10*  CALCIUM 9.1 9.0   GFR: Estimated Creatinine Clearance: 69.5 mL/min (by C-G formula based on Cr of 1.1). Liver Function Tests: No results for input(s): AST, ALT, ALKPHOS, BILITOT, PROT, ALBUMIN in the last 168 hours. No results for input(s): LIPASE, AMYLASE in the last 168  hours. No results for input(s): AMMONIA in the last 168 hours. Coagulation Profile: No results for input(s): INR, PROTIME in the last 168 hours. Cardiac Enzymes: No results for input(s): CKTOTAL, CKMB, CKMBINDEX, TROPONINI in the last 168 hours. BNP (last 3 results) No results for input(s): PROBNP in the last 8760 hours. HbA1C: No results for input(s): HGBA1C in the last 72 hours. CBG: No results for input(s): GLUCAP in the last 168 hours. Lipid Profile: No results for input(s): CHOL, HDL, LDLCALC, TRIG, CHOLHDL, LDLDIRECT in the last 72 hours. Thyroid Function Tests: No results for input(s): TSH, T4TOTAL, FREET4, T3FREE, THYROIDAB in the last 72 hours. Anemia Panel: No results for input(s): VITAMINB12, FOLATE, FERRITIN, TIBC, IRON, RETICCTPCT in the last 72 hours. Urine analysis:    Component Value Date/Time   COLORURINE YELLOW 07/17/2015 0442   APPEARANCEUR CLOUDY* 07/17/2015 0442   LABSPEC 1.017 07/17/2015 0442   PHURINE 5.5 07/17/2015 0442   GLUCOSEU NEGATIVE 07/17/2015 0442   HGBUR NEGATIVE 07/17/2015 0442   BILIRUBINUR NEGATIVE 07/17/2015 0442   BILIRUBINUR neg 09/02/2013 1115   KETONESUR NEGATIVE 07/17/2015 0442   PROTEINUR NEGATIVE 07/17/2015 0442   PROTEINUR neg 09/02/2013 1115   UROBILINOGEN 1.0 11/01/2013 1015   UROBILINOGEN 0.2 09/02/2013 1115   NITRITE NEGATIVE 07/17/2015 0442   NITRITE neg 09/02/2013 1115   LEUKOCYTESUR NEGATIVE 07/17/2015 0442   Sepsis Labs: No results for input(s): PROCALCITON, LATICACIDVEN in the last 168 hours.  Recent Results (from the past 240 hour(s))  MRSA PCR Screening     Status: None   Collection Time: 07/17/15  3:26 PM  Result Value Ref Range Status   MRSA by PCR NEGATIVE NEGATIVE Final    Comment:        The GeneXpert MRSA Assay (FDA approved for NASAL specimens only), is one component of a comprehensive MRSA colonization surveillance program. It is not intended to diagnose MRSA infection nor to guide or monitor  treatment for MRSA infections.          Radiology Studies: Mr Thoracic Spine W Wo Contrast  07/17/2015  CLINICAL DATA:  History of multiple sclerosis. Extremity weakness. Paresthesias of bilateral arms. EXAM: MRI THORACIC SPINE WITHOUT AND WITH CONTRAST TECHNIQUE: Multiplanar and multiecho pulse sequences of the thoracic spine were obtained without and with intravenous contrast. CONTRAST:  27mL MULTIHANCE GADOBENATE DIMEGLUMINE 529 MG/ML IV SOLN COMPARISON:  Multiple sclerosis exacerbation. FINDINGS: The vertebral body heights are maintained. The alignment is anatomic. There is no acute fracture or static listhesis. The marrow signal is normal. The thoracic spinal cord is normal in size and signal. The disc spaces are maintained. Partially visualized is a left renal cyst. 2-3 small right hepatic cysts. T1-T2: No disc protrusion, foraminal stenosis or central canal stenosis. T2-T3: No disc protrusion, foraminal stenosis or central canal stenosis. T3-T4: No disc protrusion, foraminal stenosis or central canal stenosis. T4-T5: No disc protrusion, foraminal stenosis or central canal stenosis. T5-T6: No disc protrusion, foraminal stenosis or central canal stenosis. T6-T7: No disc protrusion, foraminal stenosis or central canal stenosis. T7-T8: No disc protrusion, foraminal stenosis or central canal stenosis.  T8-T9: No disc protrusion, foraminal stenosis or central canal stenosis. T9-T10: No disc protrusion, foraminal stenosis or central canal stenosis. T10-T11: No disc protrusion, foraminal stenosis or central canal stenosis. T11-T12: No disc protrusion, foraminal stenosis or central canal stenosis. IMPRESSION: Normal MRI of the thoracic spine. Electronically Signed   By: Kathreen Devoid   On: 07/17/2015 15:12        Scheduled Meds: . antiseptic oral rinse  7 mL Mouth Rinse BID  . busPIRone  15 mg Oral TID  . citalopram  20 mg Oral Daily  . enoxaparin (LOVENOX) injection  40 mg Subcutaneous Q24H  .  gabapentin  800 mg Oral TID  . lamoTRIgine  200 mg Oral BID  . lisinopril  20 mg Oral Daily  . methylPREDNISolone (SOLU-MEDROL) injection  1,000 mg Intravenous Daily  . Norgestimate-Ethinyl Estradiol Triphasic  1 tablet Oral Daily  . tiZANidine  4 mg Oral QHS   Continuous Infusions: . sodium chloride 1,000 mL (07/18/15 0954)     LOS: 1 day     Janari Gagner, Orpah Melter, MD [facility] Pager [phone]  If 7PM-7AM, please contact night-coverage [email or web address] Password [email or web address] 07/18/2015, 3:22 PM

## 2015-07-18 NOTE — Progress Notes (Signed)
Patient feels that she is essentially back to baseline.   On standing, she appears stable, no pronator drift.    MS exacerbation, greatly improved.  Can be discharged following IV solumedrol tomorrow am. Neuro to sign off, please call with any further quesitons or concerns.   Roland Rack, MD [facility] [phone]  If 7pm- 7am, please page neurology on call as listed in [facility].

## 2015-07-18 NOTE — Progress Notes (Signed)
PT Cancellation Note  Patient Details Name: Dawn Peters MRN: [id number] DOB: Jul 13, 1980   Cancelled Treatment:       Pt stated she was ambulating to the bathroom now, and she is seeing great improvement. She states she has had these exacerbations in the past and aware of how to manage them. She stated she has help from family at home during these times, and has an exercise program given to her from the past. She is very aware of her MS and how to manage, and states she is doing okay at the moment slowly regaining strength at this time. She does not feel any need for PT services at this time. Will sign off. Please reorder if things change. Thank you.    Clide Dales 07/18/2015, 4:25 PM  Clide Dales, PT Pager: [phone] 07/18/2015

## 2015-07-19 DIAGNOSIS — R200 Anesthesia of skin: Secondary | ICD-10-CM

## 2015-07-19 MED ORDER — KETOROLAC TROMETHAMINE 15 MG/ML IJ SOLN
15.0000 mg | INTRAMUSCULAR | Status: AC
  Administered 2015-07-19: 15 mg via INTRAVENOUS
  Filled 2015-07-19: qty 1

## 2015-07-19 NOTE — Progress Notes (Signed)
Nursing Discharge Summary  Patient ID: Dawn Peters MRN: [id number] DOB/AGE: [birth year] 35 y.o.  Admit date: 07/17/2015 Discharge date: 07/19/2015  Discharged Condition: good  Disposition: 01-Home or Self Care  Follow-up Information    Schedule an appointment as soon as possible for a visit with Follow up with PCP in 2-3 weeks.   Why:  Hospital follow up      Prescriptions Given: No new prescriptions.  Patient follow up appointments and medications discussed.  Patient verbalized understanding without further questions.   Means of Discharge: Patient transported downstairs via wheelchair to be discharged home.   Signed: Buel Ream 07/19/2015, 10:33 AM

## 2015-07-19 NOTE — Discharge Summary (Addendum)
Physician Discharge Summary  Dawn Peters [id number] DOB: 1981-02-18 DOA: 07/17/2015  PCP: Dawn Marek, MD  Admit date: 07/17/2015 Discharge date: 07/19/2015  Time spent: 20 minutes  Recommendations for Outpatient Follow-up:  1. Follow up with PCP in 2-3 weeks   Discharge Diagnoses:  Principal Problem:   Multiple sclerosis exacerbation ([place]) Active Problems:   Essential hypertension   Gait disturbance   Numbness   Discharge Condition: Improved  Diet recommendation: Regular  Filed Weights   07/17/15 0939  Weight: 71.668 kg (158 lb)    History of present illness:  Please review dictated H and P from 5/12 for details. Briefly, 35 y.o. female with medical history significant of multiple sclerosis who follows Dr. Felecia Peters at [facility], having previous multiple sclerosis flareups, treated with Tecfidera in the past, currently in the process of getting started on Lemtrada, presenting to the emergency department with complaints of extremity weakness. She states having upper extremity weakness yesterday evening having difficulty feeding herself and dropping objects. This was associated with paresthesias to bilateral arms. She also reported having significant back pain from cervical region down to her lower back. Overnight she has experienced multiple falls experiencing significant weakness to bilateral lower extremities as well. She reports that current symptoms are similar to presentation of prior multiple sclerosis flareups  Hospital Course:  1. Multiple sclerosis exacerbation. Patient follows Dr.Sater w/ [facility]. Patient presented with recurrent falls and extremity weakness. She was seen by neurology in the emergency department recommending Solu-Medrol 1000 mg IV daily 3 days, receiving her first dose in the ER. Physical therapy consultation was placed. Continued baclofen 20 mg 3 times a day when necessary for spasticity and gabapentin 800  mg 3 times a day. - This admission, patient was noted to have improved markedly and cleared for discharge by Neurology today  2. Hypertension. Continued lisinopril 20 mg by mouth daily. Blood pressure stable  3. Anxiety. Continue BuSpar and Celexa   Consultations:  Neurology  Discharge Exam: Filed Vitals:   07/18/15 2026 07/18/15 2140 07/19/15 0523 07/19/15 0839  BP: 157/101 146/98 152/98 135/95  Pulse: 75  92 70  Temp: 98.6 F (37 C)  98.5 F (36.9 C)   TempSrc: Oral  Oral   Resp: 16  16   Height:      Weight:      SpO2: 99%  99%     General: Awake, in nad Cardiovascular: regular, s1, s2 Respiratory: normal resp effort, no wheezing  Discharge Instructions     Medication List    STOP taking these medications        diazepam 5 MG tablet  Commonly known as:  VALIUM     LORazepam 1 MG tablet  Commonly known as:  ATIVAN     methocarbamol 500 MG tablet  Commonly known as:  ROBAXIN     tamsulosin 0.4 MG Caps capsule  Commonly known as:  FLOMAX     valACYclovir 500 MG tablet  Commonly known as:  VALTREX      TAKE these medications        baclofen 20 MG tablet  Commonly known as:  LIORESAL  Take 1 tablet (20 mg total) by mouth 3 (three) times daily as needed (for ms).     busPIRone 15 MG tablet  Commonly known as:  BUSPAR  Take 1 tablet (15 mg total) by mouth 3 (three) times daily.     citalopram 20 MG tablet  Commonly known as:  CELEXA  Take one tablet by mouth daily.     diclofenac 75 MG EC tablet  Commonly known as:  VOLTAREN  Take 1 tablet (75 mg total) by mouth 2 (two) times daily.     gabapentin 800 MG tablet  Commonly known as:  NEURONTIN  Take 1 tablet (800 mg total) by mouth 3 (three) times daily.     lamoTRIgine 200 MG tablet  Commonly known as:  LAMICTAL  Take 1 tablet (200 mg total) by mouth 2 (two) times daily.     LEMTRADA 12 MG/1.2ML Soln  Generic drug:  Alemtuzumab  Inject 12 mg into the vein. Reported on 07/17/2015      lisinopril 20 MG tablet  Commonly known as:  PRINIVIL,ZESTRIL  Take 1 tablet (20 mg total) by mouth daily.     Norgestimate-Ethinyl Estradiol Triphasic 0.18/0.215/0.25 MG-25 MCG tab  Take 1 tablet by mouth daily.     SUMAtriptan 100 MG tablet  Commonly known as:  IMITREX  Take 1 tablet (100 mg total) by mouth once as needed for migraine.     tiZANidine 4 MG tablet  Commonly known as:  ZANAFLEX  Take 1 tablet (4 mg total) by mouth at bedtime.     traMADol 300 MG 24 hr tablet  Commonly known as:  ULTRAM-ER  Take 1 tablet (300 mg total) by mouth daily.       No Known Allergies Follow-up Information    Schedule an appointment as soon as possible for a visit with Follow up with PCP in 2-3 weeks.   Why:  Hospital follow up       The results of significant diagnostics from this hospitalization (including imaging, microbiology, ancillary and laboratory) are listed below for reference.    Significant Diagnostic Studies: Mr Thoracic Spine W Wo Contrast  07/17/2015  CLINICAL DATA:  History of multiple sclerosis. Extremity weakness. Paresthesias of bilateral arms. EXAM: MRI THORACIC SPINE WITHOUT AND WITH CONTRAST TECHNIQUE: Multiplanar and multiecho pulse sequences of the thoracic spine were obtained without and with intravenous contrast. CONTRAST:  24mL MULTIHANCE GADOBENATE DIMEGLUMINE 529 MG/ML IV SOLN COMPARISON:  Multiple sclerosis exacerbation. FINDINGS: The vertebral body heights are maintained. The alignment is anatomic. There is no acute fracture or static listhesis. The marrow signal is normal. The thoracic spinal cord is normal in size and signal. The disc spaces are maintained. Partially visualized is a left renal cyst. 2-3 small right hepatic cysts. T1-T2: No disc protrusion, foraminal stenosis or central canal stenosis. T2-T3: No disc protrusion, foraminal stenosis or central canal stenosis. T3-T4: No disc protrusion, foraminal stenosis or central canal stenosis. T4-T5: No disc  protrusion, foraminal stenosis or central canal stenosis. T5-T6: No disc protrusion, foraminal stenosis or central canal stenosis. T6-T7: No disc protrusion, foraminal stenosis or central canal stenosis. T7-T8: No disc protrusion, foraminal stenosis or central canal stenosis. T8-T9: No disc protrusion, foraminal stenosis or central canal stenosis. T9-T10: No disc protrusion, foraminal stenosis or central canal stenosis. T10-T11: No disc protrusion, foraminal stenosis or central canal stenosis. T11-T12: No disc protrusion, foraminal stenosis or central canal stenosis. IMPRESSION: Normal MRI of the thoracic spine. Electronically Signed   By: Kathreen Devoid   On: 07/17/2015 15:12    Microbiology: Recent Results (from the past 240 hour(s))  MRSA PCR Screening     Status: None   Collection Time: 07/17/15  3:26 PM  Result Value Ref Range Status   MRSA by PCR NEGATIVE NEGATIVE Final    Comment:  The GeneXpert MRSA Assay (FDA approved for NASAL specimens only), is one component of a comprehensive MRSA colonization surveillance program. It is not intended to diagnose MRSA infection nor to guide or monitor treatment for MRSA infections.      Labs: Basic Metabolic Panel:  Recent Labs Lab 07/17/15 0459 07/18/15 0340  NA 146* 143  K 4.7 4.1  CL 107 109  CO2 30 24  GLUCOSE 97 125*  BUN 19 13  CREATININE 1.27* 1.10*  CALCIUM 9.1 9.0   Liver Function Tests: No results for input(s): AST, ALT, ALKPHOS, BILITOT, PROT, ALBUMIN in the last 168 hours. No results for input(s): LIPASE, AMYLASE in the last 168 hours. No results for input(s): AMMONIA in the last 168 hours. CBC:  Recent Labs Lab 07/17/15 0459 07/18/15 0340  WBC 4.0 12.3*  NEUTROABS 2.4  --   HGB 12.5 12.3  HCT 36.0 35.5*  MCV 97.3 96.7  PLT 244 227   Cardiac Enzymes: No results for input(s): CKTOTAL, CKMB, CKMBINDEX, TROPONINI in the last 168 hours. BNP: BNP (last 3 results) No results for input(s): BNP in the  last 8760 hours.  ProBNP (last 3 results) No results for input(s): PROBNP in the last 8760 hours.  CBG: No results for input(s): GLUCAP in the last 168 hours.   Signed:  CHIU, STEPHEN K  [facility] 07/19/2015, 10:21 AM

## 2015-07-20 ENCOUNTER — Other Ambulatory Visit: Payer: Self-pay | Admitting: *Deleted

## 2015-07-20 NOTE — Telephone Encounter (Signed)
Dawn Peters  [name]  [address] called pt to make appt. But had to leave message to call back.

## 2015-07-20 NOTE — Telephone Encounter (Signed)
LMOM for pt. to call for appt. once she is d/c from the hospital/fim

## 2015-07-22 ENCOUNTER — Telehealth: Payer: Self-pay | Admitting: *Deleted

## 2015-07-22 MED ORDER — LISINOPRIL 20 MG PO TABS: 20.0000 mg | ORAL_TABLET | Freq: Every day | ORAL | Status: DC

## 2015-07-22 NOTE — Telephone Encounter (Signed)
90 day rx. Lisinopril escribed to [facility] per faxed request/fim

## 2015-07-23 ENCOUNTER — Telehealth: Payer: Self-pay | Admitting: *Deleted

## 2015-07-23 NOTE — Telephone Encounter (Signed)
Caller Chereese Armistead            CID [id number]  Patient SAME                 Pt's Dr Felecia Shelling        Area Code [id number]                                                                       Disp:Y/N N If Y = C/B If No Response In 42minutes ============================================================

## 2015-07-23 NOTE — Telephone Encounter (Signed)
I have spoken with Dawn Peters this afternoon.  She sts. she can' afford transportation/hotel to [facility] for Lemtrada infusions for 5 days. I will check with [name] to see if there could be any special consideration for her--any funding to help cover these expenses.[place] Cork

## 2015-07-27 NOTE — Telephone Encounter (Signed)
I have spoken with Dawn Peters this afternoon.  She sts. she is eligible for travel assistance thru [facility] and has confirmed that they will provide transportation to and from [place] all 5 days of yr. 1 Lemtrada.  I have texted [name] with [employer], and advised her of this, and asked that she check with [name] to see if she can expedite consultation/fim

## 2015-07-27 NOTE — Telephone Encounter (Signed)
Patient called, states "since she can't find a close hospital to infuse her, can she start shot", also wants to get "walking medicine". Please call [phone].

## 2015-07-28 ENCOUNTER — Ambulatory Visit (INDEPENDENT_AMBULATORY_CARE_PROVIDER_SITE_OTHER): Payer: Medicare Other | Admitting: Neurology

## 2015-07-28 ENCOUNTER — Encounter: Payer: Self-pay | Admitting: Neurology

## 2015-07-28 VITALS — BP 128/86 | HR 80 | Resp 16 | Ht 65.0 in | Wt 157.0 lb

## 2015-07-28 DIAGNOSIS — M25551 Pain in right hip: Secondary | ICD-10-CM | POA: Diagnosis not present

## 2015-07-28 DIAGNOSIS — R3911 Hesitancy of micturition: Secondary | ICD-10-CM

## 2015-07-28 DIAGNOSIS — R200 Anesthesia of skin: Secondary | ICD-10-CM

## 2015-07-28 DIAGNOSIS — M25552 Pain in left hip: Secondary | ICD-10-CM

## 2015-07-28 DIAGNOSIS — R269 Unspecified abnormalities of gait and mobility: Secondary | ICD-10-CM | POA: Diagnosis not present

## 2015-07-28 DIAGNOSIS — G35 Multiple sclerosis: Secondary | ICD-10-CM | POA: Diagnosis not present

## 2015-07-28 DIAGNOSIS — R5383 Other fatigue: Secondary | ICD-10-CM | POA: Diagnosis not present

## 2015-07-28 MED ORDER — HYDROCODONE-ACETAMINOPHEN 5-325 MG PO TABS: 1.0000 | ORAL_TABLET | Freq: Every day | ORAL | Status: DC | PRN

## 2015-07-28 NOTE — Progress Notes (Signed)
[facility]  PATIENT: Dawn Peters DOB: 1980/04/26  REFERRING DOCTOR OR PCP:  Dr. Annitta Needs SOURCE: Patient, records in EMR, lab results and radiology reports in EMR, MRI images reviewed on PACS.  _________________________________   HISTORICAL  CHIEF COMPLAINT:  Chief Complaint  Patient presents with  . Multiple Sclerosis    Sts. she was hositalized at [facility] from 5-13 to 07-19-13-17 for increased difficulty walking.  She is here today with c/o increased left hip pain.  She has had bilat total hip replacements.  She sees Dr. Mitzi Davenport (ortho in [place]) for same/fim  . Hospitalization Follow-up    HISTORY OF PRESENT ILLNESS:   Dawn Peters is a 35 year old woman who was diagnosed with MS in 2000.    She had 2 exacerbations with left-sided weakness since starting Tecfidera. She believes she was more stable on Tysabri but stopped as she was JCV-positive. We have discussed Lemtrada but are having difficulty getting a site for her to be infused.    10 days ago, she presented to [facility] ER with more left leg weakness and worse gait.   She was admitted and had 3 days of IV Solu-MEdrol.    T-spine MRI did not show any new lesions.   MRI was done without axial GE sequences.    She had MRI in April 2017.   The MRI of the brain is unchanged compared to her previous one. The MRI of the cervical spine and thoracic spine shows a couple T2 hyperintense foci consistent with MS, though none appeared to be acute.  Gait/strength/sensation:     Currently, she has mild left leg weakness and clumsiness and spasticity in her legs, worse on the left. She does not note any significant symptoms in the arms. She has a painful tingling pain in the left leg.  She is currently on gabapentin 800 mg by mouth 3 times a day and lamotrigine 200 mg twice a day for the dysesthetic pain.  For spasticity she is on baclofen and tizanidine.   She is also on tramadol ER 300 mg for pain.      Vision:    She denies any MS related visual problems.  Bladder:   She had urinary frequency and urgency helped a little by oxybutynin or Vesicare..   She noted urinary hesitancy and Flomax has helped a lot.   . She has nocturia once or twice a night.  Fatigue/sleep:   She denies any major difficulty with fatigue. She sleeps well most nights.  Mood:  She reports mild depression and anxiety and received some benefit from Celexa 20 mg by mouth daily.   Cognition:   She denies any difficulties with her cognitive abilities.  Chronic pain:   She reports chronic left > right leg and back pain and pain in both shoulders. She reports frequent hip bursitis, more on the left.    Besides gabapentin and lamotrigine, she also takes diclofenac and tramadol.    In the past, she was on oxycontin.     MS history:   She was diagnosed with MS in 2000 after presenting with gait difficulties, numbness and headaches. An MRI of the brain was consistent with MS. She was then started on Betaseron. She had difficulty tolerating Betaseron and at some point switched to Novantrone. She took Novantrone every 3 months for a year or so. A little later, she was placed on Tysabri and she stayed on it for a couple of years. However, she was  JCV-positive and switched off. She did well on Tysabri. She has been on Tecfidera since 2015 and has generally done well with it. Her last MRI from June 2015 showed that there had been no changes compared to an MRI from 2014.   She had an exacerbation March 2017.   The 08/07/2013 MRI of the brain shows multiple T2/FLAIR hyperintense foci located in the cerebellum, middle cerebellar peduncles, pons and in the periventricular white matter of the hemispheres in a pattern and configuration consistent with chronic demyelinating plaque associated with MS. There were no acute findings. There was no change compared to the previous MRI from 01/16/2013 that was also reviewed. An MRI of the cervical spine  shows subtle T2 hyperintense signal within the cord and there were no acute findings.  REVIEW OF SYSTEMS: Constitutional: No fevers, chills, sweats, or change in appetite.   Notes fatigue, some insomnia Eyes: No visual changes, double vision, eye pain Ear, nose and throat: No hearing loss, ear pain, nasal congestion, sore throat Cardiovascular: No chest pain, palpitations Respiratory: No shortness of breath at rest or with exertion.   No wheezes GastrointestinaI: No nausea, vomiting, diarrhea, abdominal pain, fecal incontinence Genitourinary: No dysuria.   Flomax has helped urinary hesitancy. 1 x nocturia. Musculoskeletal: as above Integumentary: No rash, pruritus, skin lesions Neurological: as above Psychiatric: Mild depression at this time.  Mild anxiety Endocrine: No palpitations, diaphoresis, change in appetite, change in weigh or increased thirst Hematologic/Lymphatic: No anemia, purpura, petechiae. Allergic/Immunologic: No itchy/runny eyes, nasal congestion, recent allergic reactions, rashes  ALLERGIES: No Known Allergies  HOME MEDICATIONS:  Current outpatient prescriptions:  .  baclofen (LIORESAL) 20 MG tablet, Take 1 tablet (20 mg total) by mouth 3 (three) times daily as needed (for ms)., Disp: 90 each, Rfl: 11 .  busPIRone (BUSPAR) 15 MG tablet, Take 1 tablet (15 mg total) by mouth 3 (three) times daily., Disp: 60 tablet, Rfl: 5 .  citalopram (CELEXA) 20 MG tablet, Take one tablet by mouth daily., Disp: 90 tablet, Rfl: 1 .  diclofenac (VOLTAREN) 75 MG EC tablet, Take 1 tablet (75 mg total) by mouth 2 (two) times daily., Disp: 60 tablet, Rfl: 0 .  gabapentin (NEURONTIN) 800 MG tablet, Take 1 tablet (800 mg total) by mouth 3 (three) times daily., Disp: 90 tablet, Rfl: 11 .  lamoTRIgine (LAMICTAL) 200 MG tablet, Take 1 tablet (200 mg total) by mouth 2 (two) times daily., Disp: 60 tablet, Rfl: 11 .  lisinopril (PRINIVIL,ZESTRIL) 20 MG tablet, Take 1 tablet (20 mg total) by  mouth daily., Disp: 90 tablet, Rfl: 1 .  Norgestimate-Ethinyl Estradiol Triphasic 0.18/0.215/0.25 MG-25 MCG tab, Take 1 tablet by mouth daily., Disp: 1 Package, Rfl: 11 .  SUMAtriptan (IMITREX) 100 MG tablet, Take 1 tablet (100 mg total) by mouth once as needed for migraine., Disp: 10 tablet, Rfl: 2 .  tiZANidine (ZANAFLEX) 4 MG tablet, Take 1 tablet (4 mg total) by mouth at bedtime., Disp: 30 tablet, Rfl: 11 .  traMADol (ULTRAM-ER) 300 MG 24 hr tablet, Take 1 tablet (300 mg total) by mouth daily. (Patient taking differently: Take 300 mg by mouth daily as needed for pain. ), Disp: 30 tablet, Rfl: 3 .  Alemtuzumab (LEMTRADA) 12 MG/1.2ML SOLN, Inject 12 mg into the vein. Reported on 07/28/2015, Disp: , Rfl:  .  [DISCONTINUED] Cetirizine HCl 10 MG CAPS, Take 10 mg q d prn allergies, Disp: 30 capsule, Rfl: 0  PAST MEDICAL HISTORY: Past Medical History  Diagnosis Date  . MS (multiple  sclerosis) ([place])   . Hypertension   . Anxiety   . Depression     PAST SURGICAL HISTORY: Past Surgical History  Procedure Laterality Date  . Bilateral hip arthroscopy Left 07/2013  . Joint replacement Bilateral      R 2004 and L 2005    FAMILY HISTORY: Family History  Problem Relation Age of Onset  . Breast cancer      paternal grandmother dx age 48  . Colon cancer      paternal grandmother  . Healthy Mother   . Healthy Father     SOCIAL HISTORY:  Social History   Social History  . Marital Status: Single    Spouse Name: N/A  . Number of Children: N/A  . Years of Education: N/A   Occupational History  . Not on file.   Social History Main Topics  . Smoking status: Never Smoker   . Smokeless tobacco: Never Used  . Alcohol Use: No  . Drug Use: No  . Sexual Activity: Not Currently    Birth Control/ Protection: Pill   Other Topics Concern  . Not on file   Social History Narrative     PHYSICAL EXAM  Filed Vitals:   07/28/15 1041  BP: 128/86  Pulse: 80  Resp: 16  Height: 5\' 5"   (1.651 m)  Weight: 157 lb (71.215 kg)    Body mass index is 26.13 kg/(m^2).   General: The patient is well-developed and well-nourished and in no acute distress   Neurologic Exam  Mental status: The patient is lethargic but oriented x 3 at the time of the examination. The patient has apparent normal recent and remote memory, with mildly reduced attention span and concentration ability.   Speech is normal.  Cranial nerves: Extraocular movements are full.  There is good facial sensation to soft touch bilaterally.Facial strength is normal.  Trapezius and sternocleidomastoid strength is normal. No dysarthria is noted.  The tongue is midline, and the patient has symmetric elevation of the soft palate. No obvious hearing deficits are noted.  Motor:  Muscle bulk is normal.   Tone is increased on legs, left > right. Strength is  5 / 5 in all 4 extremities.   Sensory: Sensory testing is currently intact to pinprick, soft touch and vibration sensation in all 4 extremities.  Coordination: Cerebellar testing reveals good finger-nose-finger   Gait and station: Station is now near normal.     Gait is wide but improved, tandem is poor.    c.   Reflexes: Deep tendon reflexes are increased in legs, left > right and there is spread at the knees (worse on left) and  nonsustained clonus at the left ankles.      25 foot timed walk is 7.9 seconds ([id number] e of two trials)    DIAGNOSTIC DATA (LABS, IMAGING, TESTING) - I reviewed patient records, labs, notes, testing and imaging myself where available.  Lab Results  Component Value Date   WBC 12.3* 07/18/2015   HGB 12.3 07/18/2015   HCT 35.5* 07/18/2015   MCV 96.7 07/18/2015   PLT 227 07/18/2015      Component Value Date/Time   NA 143 07/18/2015 0340   NA 143 06/03/2015 1635   K 4.1 07/18/2015 0340   CL 109 07/18/2015 0340   CO2 24 07/18/2015 0340   GLUCOSE 125* 07/18/2015 0340   GLUCOSE 90 06/03/2015 1635   BUN 13 07/18/2015 0340    BUN 20 06/03/2015 1635   CREATININE 1.10* 07/18/2015 0340  CREATININE 0.85 07/03/2014 0959   CALCIUM 9.0 07/18/2015 0340   PROT 7.2 06/03/2015 1635   PROT 6.7 07/03/2014 0959   ALBUMIN 4.4 06/03/2015 1635   ALBUMIN 4.3 07/03/2014 0959   AST 13 06/03/2015 1635   ALT 11 06/03/2015 1635   ALKPHOS 57 06/03/2015 1635   BILITOT 0.3 06/03/2015 1635   BILITOT 0.5 07/03/2014 0959   GFRNONAA >60 07/18/2015 0340   GFRNONAA 88 07/03/2014 0959   GFRAA >60 07/18/2015 0340   GFRAA >89 07/03/2014 0959   No results found for: CHOL, HDL, LDLCALC, LDLDIRECT, TRIG, CHOLHDL Lab Results  Component Value Date   HGBA1C 4.7 11/05/2012   No results found for: VITAMINB12 Lab Results  Component Value Date   TSH 1.119 05/31/2013       ASSESSMENT AND PLAN  MULTIPLE SCLEROSIS  Gait disturbance  Numbness  Other fatigue    1.   We are trying to get her a site for Lemtrada infusion as soon as possible.    It looks like she will be able to go to [facility] x 5 days 2.   We also discussed a backup plan if we cannot get at local site for [facility]. In that case, consider ocrelizumab or Zinbryta. Of those 2 she is more interested in ocrelizumab.    I gave a prescription for Valtrex that she will take when she goes on [place].  She will not need to take that with the other medications. 3.  Hydrocodone for more severe pain. If she needs to use more than intermittently, she should be seen at a pain medication clinic.  Advised to see ortho for her bursitis (has had surgery so I do not feel comfortable doing injection).    Continue other med's for for spasticity, mood, pain 5.    Ampyra 10 mg po bid. Return for infusion or call if any new or worsening neurologic symptoms per   Tywaun Hiltner A. Felecia Shelling, MD, PhD [id number], [id number] AM Certified in Neurology, Clinical Neurophysiology, Sleep Medicine, Pain Medicine and Neuroimaging  [facility] [address] [place], [place] [address] [phone]

## 2015-07-30 ENCOUNTER — Encounter: Payer: Self-pay | Admitting: *Deleted

## 2015-07-30 ENCOUNTER — Telehealth: Payer: Self-pay | Admitting: *Deleted

## 2015-07-30 NOTE — Telephone Encounter (Signed)
LMTC.  [name] has received her referral (to [facility]) and is ready to schedule her consult.  She should call his assistant, Elane Fritz, at phone # [phone], to schedule/fim

## 2015-08-06 ENCOUNTER — Telehealth: Payer: Self-pay | Admitting: *Deleted

## 2015-08-06 NOTE — Telephone Encounter (Signed)
Message For: [facility]                  Taken  1-JUN-17 at  2:52PM by [facility] ------------------------------------------------------------ Willette Alma [facility]            CID [id number]  Patient SAME                 Pt's Dr Felecia Shelling        Area Code [phone] [id number]    [place]; PLEASE C/B                                                                             Disp:Y/N N If Y = C/B If No Response In 67minutes ============================================================

## 2015-08-06 NOTE — Telephone Encounter (Signed)
LMTC./fim 

## 2015-08-10 ENCOUNTER — Telehealth: Payer: Self-pay | Admitting: Neurology

## 2015-08-10 NOTE — Telephone Encounter (Signed)
I have spoken with pt. regarding Lemtrada mult. times, as is documented in phone calls.  I have spoken with her again this morning and advised that she still needs to call Dr. Donata Duff office (phone # [phone]) to sched. consult for Lemtrada.  She verbalized understanding of same, sts. will call now/fim

## 2015-08-10 NOTE — Telephone Encounter (Signed)
See 08-10-15 telephone note/fim

## 2015-08-10 NOTE — Telephone Encounter (Signed)
Patient called, states she is supposed to start [facility], states "Faith hasn't called to let her know anything about this", also states arms jerk at night, in so much pain, shoulders hurt real bad, dragging left leg.

## 2015-09-09 ENCOUNTER — Telehealth: Payer: Self-pay | Admitting: Neurology

## 2015-09-09 NOTE — Telephone Encounter (Signed)
I have spoken with Dawn Peters this afternoon.  I have not received any request from [facility] for labs, but [facility] will be happy to order whatevery they need so that pt. doesn't have to drive back to [facility] for a simple lab draw.  She will ask them to fax request to [phone], to my attn/fim

## 2015-09-09 NOTE — Telephone Encounter (Signed)
Patient is calling. She wants to know if we received an order from [facility] for the patient to have [place] and Pregnancy tests to be done in our office. Please call and discuss.

## 2015-09-10 ENCOUNTER — Other Ambulatory Visit: Payer: Self-pay | Admitting: *Deleted

## 2015-09-10 DIAGNOSIS — G35 Multiple sclerosis: Secondary | ICD-10-CM

## 2015-09-10 DIAGNOSIS — Z79899 Other long term (current) drug therapy: Secondary | ICD-10-CM

## 2015-09-10 NOTE — Telephone Encounter (Signed)
Fax received from [facility] requesting pt. have qualitative hcg pregnancy test and varicella zoster igg/igm performed at our office, with results to be faxed back to [name] at fax# [phone].  LMOM (identified vm) for Dawn Peters to come in for labs at her convenience/fim

## 2015-09-15 ENCOUNTER — Encounter: Payer: Self-pay | Admitting: Neurology

## 2015-09-15 ENCOUNTER — Ambulatory Visit (INDEPENDENT_AMBULATORY_CARE_PROVIDER_SITE_OTHER): Payer: Medicare Other | Admitting: Neurology

## 2015-09-15 VITALS — BP 122/78 | HR 70 | Resp 14 | Ht 65.0 in | Wt 157.5 lb

## 2015-09-15 DIAGNOSIS — M87052 Idiopathic aseptic necrosis of left femur: Secondary | ICD-10-CM | POA: Diagnosis not present

## 2015-09-15 DIAGNOSIS — R200 Anesthesia of skin: Secondary | ICD-10-CM

## 2015-09-15 DIAGNOSIS — R350 Frequency of micturition: Secondary | ICD-10-CM

## 2015-09-15 DIAGNOSIS — R269 Unspecified abnormalities of gait and mobility: Secondary | ICD-10-CM

## 2015-09-15 DIAGNOSIS — R3911 Hesitancy of micturition: Secondary | ICD-10-CM

## 2015-09-15 DIAGNOSIS — Z79899 Other long term (current) drug therapy: Secondary | ICD-10-CM | POA: Diagnosis not present

## 2015-09-15 DIAGNOSIS — M87051 Idiopathic aseptic necrosis of right femur: Secondary | ICD-10-CM

## 2015-09-15 DIAGNOSIS — G35 Multiple sclerosis: Secondary | ICD-10-CM

## 2015-09-15 DIAGNOSIS — R5383 Other fatigue: Secondary | ICD-10-CM

## 2015-09-15 NOTE — Progress Notes (Signed)
[facility]  PATIENT: Dawn Peters DOB: February 08, 1981  REFERRING DOCTOR OR PCP:  Dr. Annitta Needs SOURCE: Patient, records in EMR, lab results and radiology reports in EMR, MRI images reviewed on PACS.  _________________________________   HISTORICAL  CHIEF COMPLAINT:  Chief Complaint  Patient presents with  . Multiple Sclerosis    She is sched. for Lemtrada on 09-21-15 at [facility].  Needs to have labs drawn here today.  Sts. she was told her renal function is some impaired, so she may not be able to have infusions/fim  . Gait Problem    She has been taking Ampyra for 6-8 wks--sts. she feels she is walking better/fim    HISTORY OF PRESENT ILLNESS:   Dawn Peters is a 35 year old woman who was diagnosed with MS in 2000.    She had 2 exacerbations with left-sided weakness on Tecfidera. She believes she was more stable on Tysabri but stopped as she was JCV-positive. We had discussed Lemtrada and she may be having htat infused at [facility] soon.    Her creatinine was a little high and she was told they were concerned about that.     Gait/strength/sensation:     She stumbles some but no falls.   She reports mild left leg weakness and clumsiness and spasticity in her legs, worse on the left.   She started Ampyra and is tolerating it well.    She is better than a couple months ago.   She does not note any significant symptoms in the arms.  She is currently on gabapentin 800 mg by mouth 3 times a day and lamotrigine 200 mg twice a day for the dysesthetic pain.  For spasticity she is on baclofen and tizanidine.   She is also on tramadol ER 300 mg for pain.     Vision:    She denies any MS related visual problems.  Bladder:   She had urinary frequency and urgency helped by med's.   She noted urinary hesitancy and Flomax has helped a lot.   . She has nocturia once or twice a night.  Fatigue/sleep:   She denies any major difficulty with fatigue. She sleeps well most nights.  Mood:   She reports mild depression and anxiety and received some benefit from Celexa 20 mg by mouth daily.   Cognition:   She denies any difficulties with her cognitive abilities.  Chronic pain:   She reports chronic left leg pain, back pain and shoulder pain is better on her med's.        Besides gabapentin and lamotrigine, she also takes diclofenac and tramadol.    In the past, she was on oxycontin.     MS history:   She was diagnosed with MS in 2000 after presenting with gait difficulties, numbness and headaches. An MRI of the brain was consistent with MS. She was then started on Betaseron. She had difficulty tolerating Betaseron and at some point switched to Novantrone. She took Novantrone every 3 months for a year or so. A little later, she was placed on Tysabri and she stayed on it for a couple of years. However, she was JCV-positive and switched off. She did well on Tysabri. She has been on Tecfidera since 2015 and has generally done well with it. Her last MRI from June 2015 showed that there had been no changes compared to an MRI from 2014.   She had an exacerbation March 2017.   The 08/07/2013 MRI of the brain shows  multiple T2/FLAIR hyperintense foci located in the cerebellum, middle cerebellar peduncles, pons and in the periventricular white matter of the hemispheres in a pattern and configuration consistent with chronic demyelinating plaque associated with MS. There were no acute findings. There was no change compared to the previous MRI from 01/16/2013 that was also reviewed. An MRI of the cervical spine shows subtle T2 hyperintense signal within the cord and there were no acute findings.  REVIEW OF SYSTEMS: Constitutional: No fevers, chills, sweats, or change in appetite.   Notes fatigue, some insomnia Eyes: No visual changes, double vision, eye pain Ear, nose and throat: No hearing loss, ear pain, nasal congestion, sore throat Cardiovascular: No chest pain, palpitations Respiratory: No  shortness of breath at rest or with exertion.   No wheezes GastrointestinaI: No nausea, vomiting, diarrhea, abdominal pain, fecal incontinence Genitourinary: No dysuria.   Flomax has helped urinary hesitancy. 1 x nocturia. Musculoskeletal: as above Integumentary: No rash, pruritus, skin lesions Neurological: as above Psychiatric: Mild depression at this time.  Mild anxiety Endocrine: No palpitations, diaphoresis, change in appetite, change in weigh or increased thirst Hematologic/Lymphatic: No anemia, purpura, petechiae. Allergic/Immunologic: No itchy/runny eyes, nasal congestion, recent allergic reactions, rashes  ALLERGIES: No Known Allergies  HOME MEDICATIONS:  Current outpatient prescriptions:  .  Alemtuzumab (LEMTRADA) 12 MG/1.2ML SOLN, Inject 12 mg into the vein. Reported on 07/28/2015, Disp: , Rfl:  .  baclofen (LIORESAL) 20 MG tablet, Take 1 tablet (20 mg total) by mouth 3 (three) times daily as needed (for ms)., Disp: 90 each, Rfl: 11 .  busPIRone (BUSPAR) 15 MG tablet, Take 1 tablet (15 mg total) by mouth 3 (three) times daily., Disp: 60 tablet, Rfl: 5 .  citalopram (CELEXA) 20 MG tablet, Take one tablet by mouth daily., Disp: 90 tablet, Rfl: 1 .  dalfampridine 10 MG TB12, Take 10 mg by mouth 2 (two) times daily., Disp: , Rfl:  .  diclofenac (VOLTAREN) 75 MG EC tablet, Take 1 tablet (75 mg total) by mouth 2 (two) times daily., Disp: 60 tablet, Rfl: 0 .  gabapentin (NEURONTIN) 800 MG tablet, Take 1 tablet (800 mg total) by mouth 3 (three) times daily., Disp: 90 tablet, Rfl: 11 .  HYDROcodone-acetaminophen (NORCO/VICODIN) 5-325 MG tablet, Take 1 tablet by mouth daily as needed for moderate pain., Disp: 30 tablet, Rfl: 0 .  lamoTRIgine (LAMICTAL) 200 MG tablet, Take 1 tablet (200 mg total) by mouth 2 (two) times daily., Disp: 60 tablet, Rfl: 11 .  lisinopril (PRINIVIL,ZESTRIL) 20 MG tablet, Take 1 tablet (20 mg total) by mouth daily., Disp: 90 tablet, Rfl: 1 .   Norgestimate-Ethinyl Estradiol Triphasic 0.18/0.215/0.25 MG-25 MCG tab, Take 1 tablet by mouth daily., Disp: 1 Package, Rfl: 11 .  SUMAtriptan (IMITREX) 100 MG tablet, Take 1 tablet (100 mg total) by mouth once as needed for migraine., Disp: 10 tablet, Rfl: 2 .  tiZANidine (ZANAFLEX) 4 MG tablet, Take 1 tablet (4 mg total) by mouth at bedtime., Disp: 30 tablet, Rfl: 11 .  traMADol (ULTRAM-ER) 300 MG 24 hr tablet, Take 1 tablet (300 mg total) by mouth daily. (Patient taking differently: Take 300 mg by mouth daily as needed for pain. ), Disp: 30 tablet, Rfl: 3 .  [DISCONTINUED] Cetirizine HCl 10 MG CAPS, Take 10 mg q d prn allergies, Disp: 30 capsule, Rfl: 0  PAST MEDICAL HISTORY: Past Medical History  Diagnosis Date  . MS (multiple sclerosis) ([place])   . Hypertension   . Anxiety   . Depression  PAST SURGICAL HISTORY: Past Surgical History  Procedure Laterality Date  . Bilateral hip arthroscopy Left 07/2013  . Joint replacement Bilateral      R 2004 and L 2005    FAMILY HISTORY: Family History  Problem Relation Age of Onset  . Breast cancer      paternal grandmother dx age 54  . Colon cancer      paternal grandmother  . Healthy Mother   . Healthy Father     SOCIAL HISTORY:  Social History   Social History  . Marital Status: Single    Spouse Name: N/A  . Number of Children: N/A  . Years of Education: N/A   Occupational History  . Not on file.   Social History Main Topics  . Smoking status: Never Smoker   . Smokeless tobacco: Never Used  . Alcohol Use: No  . Drug Use: No  . Sexual Activity: Not Currently    Birth Control/ Protection: Pill   Other Topics Concern  . Not on file   Social History Narrative     PHYSICAL EXAM  Filed Vitals:   09/15/15 0934  BP: 122/78  Pulse: 70  Resp: 14  Height: 5\' 5"  (1.651 m)  Weight: 157 lb 8 oz (71.442 kg)    Body mass index is 26.21 kg/(m^2).   General: The patient is well-developed and well-nourished and in no  acute distress.  Musculoskeletal:   She has good neck ROM.   Currently nontender.   Good ROM in shoulders and nontender.      Neurologic Exam  Mental status: The patient is lethargic but oriented x 3 at the time of the examination. The patient has apparent normal recent and remote memory, with mildly reduced attention span and concentration ability.   Speech is normal.  Cranial nerves: Extraocular movements are full.  There is good facial sensation to soft touch bilaterally.Facial strength is normal.  Trapezius and sternocleidomastoid strength is normal. No dysarthria is noted.  The tongue is midline, and the patient has symmetric elevation of the soft palate. No obvious hearing deficits are noted.  Motor:  Muscle bulk is normal.   Tone is increased on legs, left > right. Strength is  5 / 5 in all 4 extremities.   Sensory: Sensory testing is currently intact to pinprick, soft touch and vibration sensation in all 4 extremities.  Coordination: Cerebellar testing reveals good finger-nose-finger   Gait and station: Station is now near normal.     Gait is wide but improved, tandem is poor.    c.   Reflexes: Deep tendon reflexes are increased in legs, left > right and there is spread at the knees (worse on left) and  nonsustained clonus at the left ankles.      25 foot timed walk is 7.1 seconds (better than last visit    DIAGNOSTIC DATA (LABS, IMAGING, TESTING) - I reviewed patient records, labs, notes, testing and imaging myself where available.  Lab Results  Component Value Date   WBC 12.3* 07/18/2015   HGB 12.3 07/18/2015   HCT 35.5* 07/18/2015   MCV 96.7 07/18/2015   PLT 227 07/18/2015      Component Value Date/Time   NA 143 07/18/2015 0340   NA 143 06/03/2015 1635   K 4.1 07/18/2015 0340   CL 109 07/18/2015 0340   CO2 24 07/18/2015 0340   GLUCOSE 125* 07/18/2015 0340   GLUCOSE 90 06/03/2015 1635   BUN 13 07/18/2015 0340   BUN 20 06/03/2015  1635   CREATININE 1.10*  07/18/2015 0340   CREATININE 0.85 07/03/2014 0959   CALCIUM 9.0 07/18/2015 0340   PROT 7.2 06/03/2015 1635   PROT 6.7 07/03/2014 0959   ALBUMIN 4.4 06/03/2015 1635   ALBUMIN 4.3 07/03/2014 0959   AST 13 06/03/2015 1635   ALT 11 06/03/2015 1635   ALKPHOS 57 06/03/2015 1635   BILITOT 0.3 06/03/2015 1635   BILITOT 0.5 07/03/2014 0959   GFRNONAA >60 07/18/2015 0340   GFRNONAA 88 07/03/2014 0959   GFRAA >60 07/18/2015 0340   GFRAA >89 07/03/2014 0959   No results found for: CHOL, HDL, LDLCALC, LDLDIRECT, TRIG, CHOLHDL Lab Results  Component Value Date   HGBA1C 4.7 11/05/2012   No results found for: VITAMINB12 Lab Results  Component Value Date   TSH 1.119 05/31/2013       ASSESSMENT AND PLAN  MULTIPLE SCLEROSIS - Plan: Basic metabolic panel  Gait disturbance  Numbness  Avascular necrosis of bones of both hips (HCC)  Other fatigue  Urinary hesitancy  High risk medication use - Plan: Basic metabolic panel    1.    Lemtrada infusion at [facility] x 5 days Is now scheduled. 2.   Request, we will check a pregnancy test and there are subtle antibody. Additionally, I will recheck her BMP to make sure that the creatinine is not worsening.  3.  Tramadol and Hydrocodone for more severe pain. If she needs to use hydrocodone more than intermittently, she should be seen at a pain medication clinic.  Advised to see ortho for her bursitis (has had surgery so I do not feel comfortable doing injection).    Continue other med's for for spasticity, mood, pain 4.   Continue Ampyra 10 mg po bid. 5.    Return to see me in 3 months or sooner if there are new or worsening neurologic symptoms.  Richard A. Felecia Shelling, MD, PhD [id number], [id number] AM Certified in Neurology, Clinical Neurophysiology, Sleep Medicine, Pain Medicine and Neuroimaging  [facility] [address], [place], [place] [address] [phone]

## 2015-09-16 ENCOUNTER — Telehealth: Payer: Self-pay | Admitting: Neurology

## 2015-09-16 LAB — BASIC METABOLIC PANEL
BUN/Creatinine Ratio: 12 (ref 9–23)
BUN: 13 mg/dL (ref 6–20)
CALCIUM: 9.2 mg/dL (ref 8.7–10.2)
CHLORIDE: 103 mmol/L (ref 96–106)
CO2: 24 mmol/L (ref 18–29)
Creatinine, Ser: 1.11 mg/dL — ABNORMAL HIGH (ref 0.57–1.00)
GFR calc Af Amer: 73 mL/min/{1.73_m2} (ref >59)
GFR calc non Af Amer: 64 mL/min/{1.73_m2} (ref >59)
GLUCOSE: 76 mg/dL (ref 65–99)
POTASSIUM: 3.8 mmol/L (ref 3.5–5.2)
Sodium: 145 mmol/L — ABNORMAL HIGH (ref 134–144)

## 2015-09-16 LAB — HCG, SERUM, QUALITATIVE: HCG, BETA SUBUNIT, QUAL, SERUM: NEGATIVE m[IU]/mL (ref <6)

## 2015-09-16 LAB — VARICELLA ZOSTER ABS, IGG/IGM
Varicella IgM: <0.91 index (ref 0.00–0.90)
Varicella zoster IgG: 3841 index (ref >165)

## 2015-09-16 NOTE — Telephone Encounter (Signed)
Labresults 09/16/15 faxed to [facility] (p) [phone]

## 2015-09-17 ENCOUNTER — Other Ambulatory Visit (INDEPENDENT_AMBULATORY_CARE_PROVIDER_SITE_OTHER): Payer: Self-pay

## 2015-09-17 ENCOUNTER — Other Ambulatory Visit: Payer: Self-pay | Admitting: *Deleted

## 2015-09-17 ENCOUNTER — Telehealth: Payer: Self-pay | Admitting: Neurology

## 2015-09-17 DIAGNOSIS — R350 Frequency of micturition: Secondary | ICD-10-CM

## 2015-09-17 DIAGNOSIS — Z79899 Other long term (current) drug therapy: Secondary | ICD-10-CM

## 2015-09-17 DIAGNOSIS — Z0289 Encounter for other administrative examinations: Secondary | ICD-10-CM

## 2015-09-17 DIAGNOSIS — G35 Multiple sclerosis: Secondary | ICD-10-CM

## 2015-09-17 NOTE — Telephone Encounter (Signed)
Patient came in on Tuesday and got blood work on Tuesday.. And she said that they never got her blood count for her.. But [facility] said [facility] all the [facility] she said that [facility] isn't going to [facility] anything else here and that they will [facility] them [facility].... She said she needed all this blood work done in order to start her limtrida.. The best number to contact the patient is [phone]

## 2015-09-17 NOTE — Telephone Encounter (Signed)
-----   Message from Britt Bottom, MD sent at 09/16/2015  8:39 AM EDT ----- Please send labs to [facility].

## 2015-09-17 NOTE — Addendum Note (Signed)
Addended by: Margorie John on: 09/17/2015 02:52 PM   Modules accepted: Orders

## 2015-09-17 NOTE — Telephone Encounter (Signed)
I have spoken with Aalaiyah.  She sts. Dr. Donata Duff office needs more labs drawn but she is not clear on which ones.  I have spoken with Polly at Dr. Donata Duff office and received v/o for CBC with diff, free T4, and u/a with reflex micro.  Orders in EPIC and pt. will come in now.  Once results are received in the am, will fax to Dr. Michaelle Copas office, fax# [phone]

## 2015-09-17 NOTE — Telephone Encounter (Signed)
Lab results faxed to [name] at [facility], fax# [phone]

## 2015-09-18 ENCOUNTER — Telehealth: Payer: Self-pay | Admitting: *Deleted

## 2015-09-18 LAB — CBC WITH DIFFERENTIAL/PLATELET
Basophils Absolute: 0 10*3/uL (ref 0.0–0.2)
Basos: 0 %
EOS (ABSOLUTE): 0.1 10*3/uL (ref 0.0–0.4)
EOS: 1 %
Hematocrit: 37.3 % (ref 34.0–46.6)
Hemoglobin: 12.7 g/dL (ref 11.1–15.9)
Immature Grans (Abs): 0 10*3/uL (ref 0.0–0.1)
Immature Granulocytes: 0 %
LYMPHS ABS: 1.3 10*3/uL (ref 0.7–3.1)
Lymphs: 27 %
MCH: 33.1 pg — ABNORMAL HIGH (ref 26.6–33.0)
MCHC: 34 g/dL (ref 31.5–35.7)
MCV: 97 fL (ref 79–97)
MONOS ABS: 0.5 10*3/uL (ref 0.1–0.9)
Monocytes: 11 %
Neutrophils Absolute: 3 10*3/uL (ref 1.4–7.0)
Neutrophils: 61 %
Platelets: 252 10*3/uL (ref 150–379)
RBC: 3.84 x10E6/uL (ref 3.77–5.28)
RDW: 14.4 % (ref 12.3–15.4)
WBC: 4.9 10*3/uL (ref 3.4–10.8)

## 2015-09-18 LAB — URINALYSIS, ROUTINE W REFLEX MICROSCOPIC
BILIRUBIN UA: NEGATIVE
GLUCOSE, UA: NEGATIVE
KETONES UA: NEGATIVE
Leukocytes, UA: NEGATIVE
NITRITE UA: NEGATIVE
PROTEIN UA: NEGATIVE
RBC UA: NEGATIVE
SPEC GRAV UA: 1.016 (ref 1.005–1.030)
UUROB: 1 mg/dL (ref 0.2–1.0)
pH, UA: 7 (ref 5.0–7.5)

## 2015-09-18 LAB — T4, FREE: Free T4: 1.13 ng/dL (ref 0.82–1.77)

## 2015-09-18 NOTE — Telephone Encounter (Signed)
Results of u/a, free t4, cbc with diff faxed to Dr. Donata Duff office at below fax# [place]

## 2015-09-18 NOTE — Telephone Encounter (Signed)
Labs were faxed to [name] this morning.  I have spoken with Sable and let her know/fim

## 2015-09-18 NOTE — Telephone Encounter (Signed)
-----   Message from Britt Bottom, MD sent at 09/18/2015 11:25 AM EDT ----- Labs are okay. No protein in urine.   Please forward to [facility]. Thank you

## 2015-09-23 LAB — SPECIMEN STATUS REPORT

## 2015-09-26 LAB — SPECIMEN STATUS REPORT

## 2015-09-26 LAB — TSH: TSH: 1.71 u[IU]/mL (ref 0.450–4.500)

## 2015-10-07 ENCOUNTER — Ambulatory Visit (INDEPENDENT_AMBULATORY_CARE_PROVIDER_SITE_OTHER): Payer: Medicare Other | Admitting: Urgent Care

## 2015-10-07 VITALS — BP 118/84 | HR 97 | Temp 98.7°F | Resp 16 | Ht 65.0 in | Wt 154.0 lb

## 2015-10-07 DIAGNOSIS — Z111 Encounter for screening for respiratory tuberculosis: Secondary | ICD-10-CM | POA: Diagnosis not present

## 2015-10-07 NOTE — Progress Notes (Signed)
    MRN: [id number] DOB: [birth year]  Subjective:   Dawn Peters is a 35 y.o. 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 The screening questionnaire was completed.   Dawn Peters has a current medication list which includes the following prescription(s): alemtuzumab, baclofen, buspirone, citalopram, dalfampridine, diclofenac, gabapentin, hydrocodone-acetaminophen, lamotrigine, lisinopril, norgestimate-ethinyl estradiol triphasic, sumatriptan, tizanidine, and tramadol. Also has No Known Allergies.  Dawn Peters  has a past medical history of Anxiety; Depression; Hypertension; and MS (multiple sclerosis) ([place]). Also  has a past surgical history that includes Bilateral hip arthroscopy (Left, 07/2013) and Joint replacement (Bilateral).  Objective:   Vitals: BP 118/84 (BP Location: Right Arm, Patient Position: Sitting, Cuff Size: Normal)   Pulse 97   Temp 98.7 F (37.1 C)   Resp 16   Ht 5\' 5"  (1.651 m)   Wt 154 lb (69.9 kg)   SpO2 97%   BMI 25.63 kg/m   Physical Exam  Constitutional: She is oriented to person, place, and time. She appears well-developed and well-nourished.  Cardiovascular: Normal rate.   Pulmonary/Chest: Effort normal.  Neurological: She is alert and oriented to person, place, and time.   Assessment and Plan :   1. Tuberculosis screening - Quantiferon tb gold assay (blood) - Call patient to pick up lab results  Jaynee Eagles, PA-C [facility] [phone] 10/07/2015 6:03 PM

## 2015-10-07 NOTE — Patient Instructions (Addendum)
I will you when your tuberculosis screening results are ready for pick up.    IF you received an x-ray today, you will receive an invoice from [facility]. Please contact [facility] at [phone] with questions or concerns regarding your invoice.   IF you received labwork today, you will receive an invoice from [employer]. Please contact [facility] at [phone] with questions or concerns regarding your invoice.   Our billing staff will not be able to assist you with questions regarding bills from these companies.  You will be contacted with the lab results as soon as they are available. The fastest way to get your results is to activate your My Chart account. Instructions are located on the last page of this paperwork. If you have not heard from [place] regarding the results in 2 weeks, please contact this office.

## 2015-10-07 NOTE — Progress Notes (Signed)
  Tuberculosis Risk Questionnaire  1. No Were you born outside the [place] in one of the following parts of the world: [place], [place], [place], [place] America or Eastern [place]?    2. No Have you traveled outside the [place] and lived for more than one month in one of the following parts of the world: [place], [place], [place], [place] or [place]?    3. No Do you have a compromised immune system such as from any of the following conditions:HIV/AIDS, organ or bone marrow transplantation, diabetes, immunosuppressive medicines (e.g. Prednisone, Remicaide), leukemia, lymphoma, cancer of the head or neck, gastrectomy or jejunal bypass, end-stage renal disease (on dialysis), or silicosis?     4. Yes Have you ever or do you plan on working in: a residential care center, a health care facility, a jail or prison or homeless shelter?    5. No Have you ever: injected illegal drugs, used crack cocaine, lived in a homeless shelter  or been in jail or prison?     6. No Have you ever been exposed to anyone with infectious tuberculosis?    Tuberculosis Symptom Questionnaire  Do you currently have any of the following symptoms?  1. No Unexplained cough lasting more than 3 weeks?   2. No Unexplained fever lasting more than 3 weeks.   3. No Night Sweats (sweating that leaves the bedclothes and sheets wet)     4. No Shortness of Breath   5. No Chest Pain   6. No Unintentional weight loss    7. No Unexplained fatigue (very tired for no reason)   

## 2015-10-09 ENCOUNTER — Telehealth: Payer: Self-pay

## 2015-10-09 LAB — QUANTIFERON TB GOLD ASSAY (BLOOD)

## 2015-10-09 NOTE — Telephone Encounter (Signed)
See result note from 10/09/2015.

## 2015-10-15 ENCOUNTER — Ambulatory Visit (INDEPENDENT_AMBULATORY_CARE_PROVIDER_SITE_OTHER): Payer: Medicare Other | Admitting: Urgent Care

## 2015-10-15 ENCOUNTER — Other Ambulatory Visit: Payer: Self-pay | Admitting: Urgent Care

## 2015-10-15 DIAGNOSIS — Z111 Encounter for screening for respiratory tuberculosis: Secondary | ICD-10-CM

## 2015-10-15 NOTE — Progress Notes (Signed)
Patient returning for PPD placement since we failed to obtain her quantiferon test for tuberculosis screening.

## 2015-10-15 NOTE — Progress Notes (Signed)
Patient is presenting for PPD placement. The lab [facility] did not pick up her quantiferon blood test sample at her last visit in time. It was subsequently cancelled.

## 2015-10-17 ENCOUNTER — Ambulatory Visit (INDEPENDENT_AMBULATORY_CARE_PROVIDER_SITE_OTHER): Payer: Medicare Other | Admitting: Family Medicine

## 2015-10-17 ENCOUNTER — Encounter: Payer: Self-pay | Admitting: Family Medicine

## 2015-10-17 VITALS — BP 140/82 | HR 101 | Temp 98.0°F | Resp 18 | Ht 66.5 in | Wt 159.2 lb

## 2015-10-17 DIAGNOSIS — L98491 Non-pressure chronic ulcer of skin of other sites limited to breakdown of skin: Secondary | ICD-10-CM | POA: Diagnosis not present

## 2015-10-17 DIAGNOSIS — M25562 Pain in left knee: Secondary | ICD-10-CM

## 2015-10-17 DIAGNOSIS — G35 Multiple sclerosis: Secondary | ICD-10-CM | POA: Diagnosis not present

## 2015-10-17 LAB — CBC WITH DIFFERENTIAL/PLATELET
BASOS ABS: 0 {cells}/uL (ref 0–200)
Basophils Relative: 0 %
EOS PCT: 0 %
Eosinophils Absolute: 0 cells/uL — ABNORMAL LOW (ref 15–500)
HCT: 35.6 % (ref 35.0–45.0)
HEMOGLOBIN: 12.6 g/dL (ref 11.7–15.5)
LYMPHS ABS: 21 {cells}/uL — AB (ref 850–3900)
Lymphocytes Relative: 1 %
MCH: 33.7 pg — AB (ref 27.0–33.0)
MCHC: 35.4 g/dL (ref 32.0–36.0)
MCV: 95.2 fL (ref 80.0–100.0)
MONOS PCT: 6 %
MPV: 9.6 fL (ref 7.5–12.5)
Monocytes Absolute: 126 cells/uL — ABNORMAL LOW (ref 200–950)
NEUTROS PCT: 93 %
Neutro Abs: 1953 cells/uL (ref 1500–7800)
PLATELETS: 256 10*3/uL (ref 140–400)
RBC: 3.74 MIL/uL — AB (ref 3.80–5.10)
RDW: 13.3 % (ref 11.0–15.0)
WBC: 2.1 10*3/uL — AB (ref 3.8–10.8)

## 2015-10-17 LAB — TB SKIN TEST: TB Skin Test: NEGATIVE

## 2015-10-17 LAB — SEDIMENTATION RATE: Sed Rate: 6 mm/hr (ref 0–20)

## 2015-10-17 MED ORDER — MUPIROCIN 2 % EX OINT: 1.0000 "application " | TOPICAL_OINTMENT | Freq: Three times a day (TID) | CUTANEOUS | 0 refills | Status: DC

## 2015-10-17 MED ORDER — HYDROCODONE-ACETAMINOPHEN 5-325 MG PO TABS: 1.0000 | ORAL_TABLET | Freq: Four times a day (QID) | ORAL | 0 refills | Status: DC | PRN

## 2015-10-17 NOTE — Patient Instructions (Signed)
     IF you received an x-ray today, you will receive an invoice from [facility]. Please contact [facility] at [phone] with questions or concerns regarding your invoice.   IF you received labwork today, you will receive an invoice from [facility]. Please contact [facility] at [phone] with questions or concerns regarding your invoice.   Our billing staff will not be able to assist you with questions regarding bills from these companies.  You will be contacted with the lab results as soon as they are available. The fastest way to get your results is to activate your My Chart account. Instructions are located on the last page of this paperwork. If you have not heard from us regarding the results in 2 weeks, please contact this office.      

## 2015-10-17 NOTE — Progress Notes (Signed)
Subjective:  By signing my name below, I, [name], attest that this documentation has been prepared under the direction and in the presence of [name], MD.  Electronically Signed: Thea Alken, ED Scribe. 11/02/2015. 2:57 PM.   Patient ID: Dawn Peters, female    DOB: 05-29-1980, 35 y.o.   MRN: [id number]  10/17/2015  Hemorrhoids (Possible hemorrhoid pt unsure)   HPI Dawn Peters is a 35 y.o. female who presents to the [facility] complaining of possible hemorrhoid onset 2 weeks. Initially came on for TB reading. Pt reports right buttock pain with wiping and sitting. She has applied Vaseline and OTC abx cream. She is unsure of rash. Pt is sexually active. She had STI prior to starting [place] which was a couple days ago. She denies fever, chills, sweats. Diarrhea, nausea and vomiting, vaginal sores. Pt has hx of bilateral hip replacement.  Pt also complains of left knee pain and swelling that began 2 weeks ago. She was initially seen in the ED for left knee pain; treated with Voltaren gel and ibuprofen and advised to f/u with ortho. She has been seen by ortho, Dr. Noralee Chars, who also did her hip replacements. States she was treated with hydrocodone. She was supposed to follow up with Dr. Noralee Chars in April but states her appointment got cancelled. She has not had an MRI. She takes tramadol daily without relief and Voltaren gel. She has received a cortisone injection in the past but states it only helped for a couple days.  She denies left knee injury. She reports pain with walking and has been limping and "dragging" left leg with walking.  PCP- [name], MD at [facility].   Review of Systems  Constitutional: Negative for chills, diaphoresis and fever.  Gastrointestinal: Positive for rectal pain. Negative for abdominal pain, nausea and vomiting.  Musculoskeletal: Positive for arthralgias, gait problem and joint swelling.  Skin: Positive for  wound. Negative for rash.  Neurological: Negative for weakness and numbness.    Past Medical History:  Diagnosis Date  . Anxiety   . Depression   . Hypertension   . MS (multiple sclerosis) ([place])    Past Surgical History:  Procedure Laterality Date  . BILATERAL HIP ARTHROSCOPY Left 07/2013  . JOINT REPLACEMENT Bilateral     R 2004 and L 2005   No Known Allergies  Social History   Social History  . Marital status: Single    Spouse name: N/A  . Number of children: N/A  . Years of education: N/A   Occupational History  . Not on file.   Social History Main Topics  . Smoking status: Never Smoker  . Smokeless tobacco: Never Used  . Alcohol use No  . Drug use: No  . Sexual activity: Not Currently    Birth control/ protection: Pill   Other Topics Concern  . Not on file   Social History Narrative  . No narrative on file   Family History  Problem Relation Age of Onset  . Breast cancer      paternal grandmother dx age 55  . Colon cancer      paternal grandmother  . Healthy Mother   . Healthy Father        Objective:    BP 140/82 (BP Location: Right Arm, Patient Position: Sitting, Cuff Size: Normal)   Pulse (!) 101   Temp 98 F (36.7 C) (Oral)   Resp 18   Ht 5' 6.5" (  1.689 m)   Wt 159 lb 3.2 oz (72.2 kg)   LMP 10/04/2015   SpO2 98%   BMI 25.31 kg/m  Physical Exam  Constitutional: She is oriented to person, place, and time. She appears well-developed and well-nourished. No distress.  HENT:  Head: Normocephalic and atraumatic.  Eyes: Conjunctivae are normal. Pupils are equal, round, and reactive to light.  Neck: Normal range of motion. Neck supple.  Cardiovascular: Normal rate, regular rhythm and normal heart sounds.  Exam reveals no gallop and no friction rub.   No murmur heard. Pulmonary/Chest: Effort normal and breath sounds normal. She has no wheezes. She has no rales.  Abdominal: Soft. Bowel sounds are normal. She exhibits no distension and no mass.  There is no tenderness. There is no rebound and no guarding.  Genitourinary: Vagina normal.     Genitourinary Comments: Superior to anal opening there is an 36mm x 4 mm superficial Ulcerative lesion.  Musculoskeletal:       Left knee: She exhibits decreased range of motion, swelling and effusion. She exhibits no ecchymosis. Tenderness found.  Moderate effusion L knee.  Neurological: She is alert and oriented to person, place, and time.  Skin: She is not diaphoretic.  Psychiatric: She has a normal mood and affect. Her behavior is normal.  Nursing note and vitals reviewed.      Assessment & Plan:   1. Perianal ulcer, limited to breakdown of skin ([place])   2. Left lateral knee pain   3. MULTIPLE SCLEROSIS    Perianal ulcer -New; send HSV culture and RPR. -continue to use Vaseline or abx cream. Rx for Bactroban provided. -use flushable wet wipes.   Left knee pain  -chronic - take Hydrocodone 5-325 mg -f/u with Ortho due to moderate knee effusion in office today.    Orders Placed This Encounter  Procedures  . Herpes simplex virus culture  . RPR  . CBC with Differential/Platelet  . Sedimentation rate   Meds ordered this encounter  Medications  . HYDROcodone-acetaminophen (NORCO/VICODIN) 5-325 MG tablet    Sig: Take 1 tablet by mouth every 6 (six) hours as needed for moderate pain.    Dispense:  30 tablet    Refill:  0  . mupirocin ointment (BACTROBAN) 2 %    Sig: Apply 1 application topically 3 (three) times daily.    Dispense:  30 g    Refill:  0    No Follow-up on file.   I personally performed the services described in this documentation, which was scribed in my presence. The recorded information has been reviewed and considered.  Tyee Vandevoorde Elayne Guerin, M.D. [facility] [address] [place], [place]  [address] [phone] phone [phone] fax

## 2015-10-19 LAB — HERPES SIMPLEX VIRUS CULTURE: ORGANISM ID, BACTERIA: NOT DETECTED

## 2015-10-20 LAB — RPR

## 2015-10-21 ENCOUNTER — Telehealth: Payer: Self-pay

## 2015-10-23 ENCOUNTER — Telehealth: Payer: Self-pay

## 2015-10-23 NOTE — Telephone Encounter (Signed)
Patient would like lab results but they haven't been reviewed yet. Please call! [phone]

## 2015-10-23 NOTE — Telephone Encounter (Signed)
Message placed

## 2015-10-23 NOTE — Telephone Encounter (Signed)
Please review labs? Thanks! 

## 2015-10-26 ENCOUNTER — Telehealth: Payer: Self-pay | Admitting: Neurology

## 2015-10-26 MED ORDER — TRAMADOL HCL ER 300 MG PO TB24: 300.0000 mg | ORAL_TABLET | Freq: Every day | ORAL | 3 refills | Status: DC

## 2015-10-26 NOTE — Telephone Encounter (Signed)
Call --- no evidence of herpes infection or syphilis infection as cause of sore/wound on buttocks.  2. WBC count is low at 2.1 which is likely medication side effect.  Recommend repeating WBC count in upcoming 1-4 weeks with primary care physician.  How is sore/wound doing?

## 2015-10-26 NOTE — Telephone Encounter (Signed)
Patient called to request refill of traMADol (ULTRAM-ER) 300 MG 24 hr [facility] [address]

## 2015-10-26 NOTE — Telephone Encounter (Signed)
Rx. awaiting RAS sig/fim 

## 2015-10-26 NOTE — Telephone Encounter (Signed)
Patient was given results 

## 2015-10-27 NOTE — Telephone Encounter (Signed)
Tramadol rx. faxed [facility] per pt's request/fim

## 2015-10-29 ENCOUNTER — Telehealth: Payer: Self-pay | Admitting: Neurology

## 2015-10-29 MED ORDER — METHYLPREDNISOLONE 4 MG PO TBPK: ORAL_TABLET | ORAL | 0 refills | Status: DC

## 2015-10-29 NOTE — Telephone Encounter (Signed)
Pt called request refill for prednisone. Sts her left leg drags and it helps with this

## 2015-10-30 ENCOUNTER — Encounter: Payer: Self-pay | Admitting: Neurology

## 2015-10-30 ENCOUNTER — Other Ambulatory Visit: Payer: Self-pay | Admitting: Neurology

## 2015-10-30 ENCOUNTER — Ambulatory Visit (INDEPENDENT_AMBULATORY_CARE_PROVIDER_SITE_OTHER): Payer: Medicare Other | Admitting: Neurology

## 2015-10-30 VITALS — BP 126/84 | HR 72 | Resp 14 | Ht 66.5 in | Wt 154.5 lb

## 2015-10-30 DIAGNOSIS — G35 Multiple sclerosis: Secondary | ICD-10-CM | POA: Diagnosis not present

## 2015-10-30 DIAGNOSIS — R200 Anesthesia of skin: Secondary | ICD-10-CM

## 2015-10-30 DIAGNOSIS — F418 Other specified anxiety disorders: Secondary | ICD-10-CM | POA: Diagnosis not present

## 2015-10-30 DIAGNOSIS — R269 Unspecified abnormalities of gait and mobility: Secondary | ICD-10-CM | POA: Diagnosis not present

## 2015-10-30 DIAGNOSIS — N399 Disorder of urinary system, unspecified: Secondary | ICD-10-CM

## 2015-10-30 DIAGNOSIS — Z79899 Other long term (current) drug therapy: Secondary | ICD-10-CM

## 2015-10-30 MED ORDER — AMITRIPTYLINE HCL 25 MG PO TABS: ORAL_TABLET | ORAL | 11 refills | Status: DC

## 2015-10-30 MED ORDER — CITALOPRAM HYDROBROMIDE 40 MG PO TABS
ORAL_TABLET | ORAL | 3 refills | Status: DC
Start: 2015-10-30 — End: 2016-05-04

## 2015-10-30 NOTE — Progress Notes (Signed)
[facility]  PATIENT: Dawn Peters DOB: May 12, 1980  REFERRING DOCTOR OR PCP:  Dr. Annitta Needs SOURCE: Patient, records in EMR, lab results and radiology reports in EMR, MRI images reviewed on PACS.  _________________________________   HISTORICAL  CHIEF COMPLAINT:  Chief Complaint  Patient presents with  . Multiple Sclerosis    She had 1st yr. Lemtrada infusions July 17-23, 2017.  Sts. she tolerated infusions well.  Denies new sx.  Sts. she drags left leg some, but has a knee injury she believes is causing this./fim    HISTORY OF PRESENT ILLNESS:   Dawn Peters is a 35 year old woman who was diagnosed with MS in 2000.      MS:   Due to breakthrough disease on Tecfidera, she switched to [place]. She had her first course of treatment at [facility] last month. She tolerated the infusions well.   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  Gait/strength/sensation:    She feels her left leg drags some, especially if she is tired.   She stumbles some but no falls.     She started Ampyra and is tolerating it well.      She does not note any significant symptoms in the arms.  She is currently on gabapentin 800 mg by mouth 4 times a day and lamotrigine 200 mg twice a day for the dysesthetic pain.  Adding lamotrigine did not help.    For spasticity she is on baclofen and tizanidine.   She is also on tramadol ER 300 mg for pain.     Vision:    She denies any MS related visual problems.  Bladder:   She had urinary frequency and urgency helped by oxybutynin or vesicare.     She has nocturia once or twice a night.  Fatigue/sleep:   She denies any major difficulty with fatigue. She sleeps well most nights.  Mood:  She reports mild depression and anxiety and received some benefit from Celexa 20 mg by mouth daily.   Buspar helps anxiety some.    Anxiety is more of a problem than depression  Cognition:   She denies any difficulties with  her cognitive abilities.  Chronic pain:   She reports chronic left leg pain, back pain and shoulder pain is better on her med's.        Besides gabapentin and lamotrigine, she also takes diclofenac and tramadol.    In the past, she was on oxycontin.     MS history:   She was diagnosed with MS in 2000 after presenting with gait difficulties, numbness and headaches. An MRI of the brain was consistent with MS. She was then started on Betaseron. She had difficulty tolerating Betaseron and at some point switched to Novantrone. She took Novantrone every 3 months for a year or so. A little later, she was placed on Tysabri and she stayed on it for a couple of years. However, she was JCV-positive and switched off. She did well on Tysabri. She has been on Tecfidera since 2015 and has generally done well with it. Her last MRI from June 2015 showed that there had been no changes compared to an MRI from 2014.   She had an exacerbation March 2017.   The 08/07/2013 MRI of the brain shows multiple T2/FLAIR hyperintense foci located in the cerebellum, middle cerebellar peduncles, pons and in the periventricular white matter of the hemispheres in a pattern and configuration consistent with  chronic demyelinating plaque associated with MS. There were no acute findings. There was no change compared to the previous MRI from 01/16/2013 that was also reviewed. An MRI of the cervical spine shows subtle T2 hyperintense signal within the cord and there were no acute findings.  REVIEW OF SYSTEMS: Constitutional: No fevers, chills, sweats, or change in appetite.   Notes fatigue, some insomnia Eyes: No visual changes, double vision, eye pain Ear, nose and throat: No hearing loss, ear pain, nasal congestion, sore throat Cardiovascular: No chest pain, palpitations Respiratory: No shortness of breath at rest or with exertion.   No wheezes GastrointestinaI: No nausea, vomiting, diarrhea, abdominal pain, fecal  incontinence Genitourinary: No dysuria.   Flomax has helped urinary hesitancy. 1 x nocturia. Musculoskeletal: as above Integumentary: No rash, pruritus, skin lesions Neurological: as above Psychiatric: Mild depression at this time.  Mild anxiety Endocrine: No palpitations, diaphoresis, change in appetite, change in weigh or increased thirst Hematologic/Lymphatic: No anemia, purpura, petechiae. Allergic/Immunologic: No itchy/runny eyes, nasal congestion, recent allergic reactions, rashes  ALLERGIES: No Known Allergies  HOME MEDICATIONS:  Current Outpatient Prescriptions:  .  Alemtuzumab (LEMTRADA) 12 MG/1.2ML SOLN, Inject 12 mg into the vein. Reported on 07/28/2015, Disp: , Rfl:  .  baclofen (LIORESAL) 20 MG tablet, Take 1 tablet (20 mg total) by mouth 3 (three) times daily as needed (for ms)., Disp: 90 each, Rfl: 11 .  busPIRone (BUSPAR) 15 MG tablet, Take 1 tablet (15 mg total) by mouth 3 (three) times daily., Disp: 60 tablet, Rfl: 5 .  citalopram (CELEXA) 20 MG tablet, Take one tablet by mouth daily., Disp: 90 tablet, Rfl: 1 .  dalfampridine 10 MG TB12, Take 10 mg by mouth 2 (two) times daily., Disp: , Rfl:  .  diclofenac (VOLTAREN) 75 MG EC tablet, Take 1 tablet (75 mg total) by mouth 2 (two) times daily., Disp: 60 tablet, Rfl: 0 .  gabapentin (NEURONTIN) 800 MG tablet, Take 1 tablet (800 mg total) by mouth 3 (three) times daily., Disp: 90 tablet, Rfl: 11 .  HYDROcodone-acetaminophen (NORCO/VICODIN) 5-325 MG tablet, Take 1 tablet by mouth every 6 (six) hours as needed for moderate pain., Disp: 30 tablet, Rfl: 0 .  lamoTRIgine (LAMICTAL) 200 MG tablet, Take 1 tablet (200 mg total) by mouth 2 (two) times daily., Disp: 60 tablet, Rfl: 11 .  lisinopril (PRINIVIL,ZESTRIL) 20 MG tablet, Take 1 tablet (20 mg total) by mouth daily., Disp: 90 tablet, Rfl: 1 .  methylPREDNISolone (MEDROL DOSEPAK) 4 MG TBPK tablet, Take 6 tablets on day 1, 5 on day 2, 4 on day 3, 3 on day 4, 2 on day 5, and 1 one  day 6, Disp: 21 tablet, Rfl: 0 .  mupirocin ointment (BACTROBAN) 2 %, Apply 1 application topically 3 (three) times daily., Disp: 30 g, Rfl: 0 .  Norgestimate-Ethinyl Estradiol Triphasic 0.18/0.215/0.25 MG-25 MCG tab, Take 1 tablet by mouth daily., Disp: 1 Package, Rfl: 11 .  SUMAtriptan (IMITREX) 100 MG tablet, Take 1 tablet (100 mg total) by mouth once as needed for migraine., Disp: 10 tablet, Rfl: 2 .  tiZANidine (ZANAFLEX) 4 MG tablet, Take 1 tablet (4 mg total) by mouth at bedtime., Disp: 30 tablet, Rfl: 11 .  traMADol (ULTRAM-ER) 300 MG 24 hr tablet, Take 1 tablet (300 mg total) by mouth daily., Disp: 30 tablet, Rfl: 3  PAST MEDICAL HISTORY: Past Medical History:  Diagnosis Date  . Anxiety   . Depression   . Hypertension   . MS (multiple sclerosis) ([place])  PAST SURGICAL HISTORY: Past Surgical History:  Procedure Laterality Date  . BILATERAL HIP ARTHROSCOPY Left 07/2013  . JOINT REPLACEMENT Bilateral     R 2004 and L 2005    FAMILY HISTORY: Family History  Problem Relation Age of Onset  . Breast cancer      paternal grandmother dx age 37  . Colon cancer      paternal grandmother  . Healthy Mother   . Healthy Father     SOCIAL HISTORY:  Social History   Social History  . Marital status: Single    Spouse name: N/A  . Number of children: N/A  . Years of education: N/A   Occupational History  . Not on file.   Social History Main Topics  . Smoking status: Never Smoker  . Smokeless tobacco: Never Used  . Alcohol use No  . Drug use: No  . Sexual activity: Not Currently    Birth control/ protection: Pill   Other Topics Concern  . Not on file   Social History Narrative  . No narrative on file     PHYSICAL EXAM  Vitals:   10/30/15 1130  BP: 126/84  Pulse: 72  Resp: 14  Weight: 154 lb 8 oz (70.1 kg)  Height: 5' 6.5" (1.689 m)    Body mass index is 24.56 kg/m.   General: The patient is well-developed and well-nourished and in no acute  distress.  Musculoskeletal:   She has good neck ROM.     Neurologic Exam  Mental status: The patient is lethargic but oriented x 3 at the time of the examination. The patient has apparent normal recent and remote memory, with mildly reduced attention span and concentration ability.   Speech is normal.  Cranial nerves: Extraocular movements are full.  There is good facial sensation to soft touch bilaterally.Facial strength is normal.  Trapezius and sternocleidomastoid strength is normal. No dysarthria is noted.  The tongue is midline, and the patient has symmetric elevation of the soft palate. No obvious hearing deficits are noted.  Motor:  Muscle bulk is normal.   Tone is increased on legs, left > right. Strength is  5 / 5 in all 4 extremities.   Sensory: Sensory testing is currently intact to pinprick, soft touch and vibration sensation in all 4 extremities.  Coordination: Cerebellar testing reveals good finger-nose-finger   Gait and station: Station is now near normal.     Gait is wide, tandem is poor.      Reflexes: Deep tendon reflexes are increased in legs, left > right and there is spread at the knees (worse on left) and  nonsustained clonus at the left ankles.         DIAGNOSTIC DATA (LABS, IMAGING, TESTING) - I reviewed patient records, labs, notes, testing and imaging myself where available.  Lab Results  Component Value Date   WBC 2.1 (L) 10/17/2015   HGB 12.6 10/17/2015   HCT 35.6 10/17/2015   MCV 95.2 10/17/2015   PLT 256 10/17/2015      Component Value Date/Time   NA 145 (H) 09/15/2015 1004   K 3.8 09/15/2015 1004   CL 103 09/15/2015 1004   CO2 24 09/15/2015 1004   GLUCOSE 76 09/15/2015 1004   GLUCOSE 125 (H) 07/18/2015 0340   BUN 13 09/15/2015 1004   CREATININE 1.11 (H) 09/15/2015 1004   CREATININE 0.85 07/03/2014 0959   CALCIUM 9.2 09/15/2015 1004   PROT 7.2 06/03/2015 1635   ALBUMIN 4.4 06/03/2015 1635  AST 13 06/03/2015 1635   ALT 11 06/03/2015 1635    ALKPHOS 57 06/03/2015 1635   BILITOT 0.3 06/03/2015 1635   GFRNONAA 64 09/15/2015 1004   GFRNONAA 88 07/03/2014 0959   GFRAA 73 09/15/2015 1004   GFRAA >89 07/03/2014 0959   No results found for: CHOL, HDL, LDLCALC, LDLDIRECT, TRIG, CHOLHDL Lab Results  Component Value Date   HGBA1C 4.7 11/05/2012   No results found for: VITAMINB12 Lab Results  Component Value Date   TSH 1.710 09/15/2015       ASSESSMENT AND PLAN  MULTIPLE SCLEROSIS  Gait disturbance  Numbness  Urinary disorder  High risk medication use  Depression with anxiety     1.   we will check labs (CBC/D, BMP, UA) and make sure she gets into official [facility] monitoring program 2.   D/c lamotrigine.  Add amitriptyline 3.    Continue other med's.    4.    Return to see me in 4 months or sooner if there are new or worsening neurologic symptoms.  Kethan Papadopoulos A. Felecia Shelling, MD, PhD [id number], [id number] AM Certified in Neurology, Clinical Neurophysiology, Sleep Medicine, Pain Medicine and Neuroimaging  [facility] [address] [place],  [address] [phone]

## 2015-10-30 NOTE — Addendum Note (Signed)
Addended by: Arlice Colt A on: 10/30/2015 12:14 PM   Modules accepted: Orders

## 2015-10-31 LAB — URINALYSIS, ROUTINE W REFLEX MICROSCOPIC
Bilirubin, UA: NEGATIVE
Glucose, UA: NEGATIVE
KETONES UA: NEGATIVE
LEUKOCYTES UA: NEGATIVE
Nitrite, UA: NEGATIVE
PH UA: 6 (ref 5.0–7.5)
Protein, UA: NEGATIVE
SPEC GRAV UA: 1.013 (ref 1.005–1.030)
Urobilinogen, Ur: 0.2 mg/dL (ref 0.2–1.0)

## 2015-10-31 LAB — CBC WITH DIFFERENTIAL/PLATELET
BASOS: 0 %
Basophils Absolute: 0 10*3/uL (ref 0.0–0.2)
EOS (ABSOLUTE): 0 10*3/uL (ref 0.0–0.4)
EOS: 1 %
HEMATOCRIT: 39.6 % (ref 34.0–46.6)
HEMOGLOBIN: 14 g/dL (ref 11.1–15.9)
Immature Grans (Abs): 0.1 10*3/uL (ref 0.0–0.1)
Immature Granulocytes: 2 %
LYMPHS ABS: 0.3 10*3/uL — AB (ref 0.7–3.1)
Lymphs: 7 %
MCH: 33.6 pg — AB (ref 26.6–33.0)
MCHC: 35.4 g/dL (ref 31.5–35.7)
MCV: 95 fL (ref 79–97)
MONOCYTES: 6 %
MONOS ABS: 0.3 10*3/uL (ref 0.1–0.9)
NEUTROS ABS: 3.8 10*3/uL (ref 1.4–7.0)
Neutrophils: 84 %
Platelets: 228 10*3/uL (ref 150–379)
RBC: 4.17 x10E6/uL (ref 3.77–5.28)
RDW: 14.2 % (ref 12.3–15.4)
WBC: 4.5 10*3/uL (ref 3.4–10.8)

## 2015-10-31 LAB — BASIC METABOLIC PANEL
BUN/Creatinine Ratio: 12 (ref 9–23)
BUN: 15 mg/dL (ref 6–20)
CALCIUM: 9.8 mg/dL (ref 8.7–10.2)
CO2: 24 mmol/L (ref 18–29)
Chloride: 99 mmol/L (ref 96–106)
Creatinine, Ser: 1.21 mg/dL — ABNORMAL HIGH (ref 0.57–1.00)
GFR, EST AFRICAN AMERICAN: 66 mL/min/{1.73_m2} (ref >59)
GFR, EST NON AFRICAN AMERICAN: 57 mL/min/{1.73_m2} — AB (ref >59)
Glucose: 85 mg/dL (ref 65–99)
Potassium: 4.4 mmol/L (ref 3.5–5.2)
Sodium: 142 mmol/L (ref 134–144)

## 2015-10-31 LAB — MICROSCOPIC EXAMINATION: CASTS: NONE SEEN /LPF

## 2015-11-01 LAB — URINE CULTURE

## 2015-11-02 MED ORDER — VALACYCLOVIR HCL 500 MG PO TABS
500.0000 mg | ORAL_TABLET | Freq: Two times a day (BID) | ORAL | 2 refills | Status: DC
Start: 2015-11-02 — End: 2016-10-25

## 2015-11-02 NOTE — Telephone Encounter (Signed)
-----   Message from Britt Bottom, MD sent at 11/01/2015  5:46 PM EDT ----- Please let her know that the lab work looked okay. I checked her med list and she does not appear to be on Valtrex. Please call in 500 mg by mouth twice a day #60   #2 and let her know that she should take that for the next 3 months as there is a higher risk of shingles the first few months after [place].

## 2015-11-02 NOTE — Telephone Encounter (Signed)
I have spoken with Dawn Peters this afternoon, and per RAS, advised that labs look ok.  She should be taking Valtrex 500mg  po bid for the next 3 mos., as there is a higher risk for shingles in the first few mos. following Lemdrada.  She verbalized understanding of same.  [facility]fim

## 2015-11-10 ENCOUNTER — Telehealth: Payer: Self-pay | Admitting: Neurology

## 2015-11-10 NOTE — Telephone Encounter (Signed)
Patient called, states she is having problems with MS, problems with legs, feels weak. Please call [phone].

## 2015-11-11 MED ORDER — OXCARBAZEPINE 300 MG PO TABS: 300.0000 mg | ORAL_TABLET | Freq: Two times a day (BID) | ORAL | 1 refills | Status: DC

## 2015-11-11 NOTE — Telephone Encounter (Signed)
I have spoken with Dawn Peters this afternoon.  She sts. she still can't stand to wear any clothes that touch her calves.  She couldn't tolerate Lamictal and sts. Amitrip doesn't help.  Will check with RAS for other options and call her back/fim

## 2015-11-11 NOTE — Telephone Encounter (Signed)
I have spoken with Dawn Peters this afternoon, and per RAS, advised she may try Trileptal 300mg  po bid.  If she feels she needs pain mx., ok to refer to a pain mx. center.  She verbalized understanding of same, sts. would like to try Trileptal first.  [facility] [address] [facility]/fim

## 2015-11-18 ENCOUNTER — Emergency Department (HOSPITAL_COMMUNITY)
Admission: EM | Admit: 2015-11-18 | Discharge: 2015-11-18 | Disposition: A | Discharge status: Home / Self Care | Payer: Medicare Other | Attending: Emergency Medicine | Admitting: Emergency Medicine

## 2015-11-18 ENCOUNTER — Encounter (HOSPITAL_COMMUNITY): Payer: Self-pay | Admitting: Emergency Medicine

## 2015-11-18 ENCOUNTER — Emergency Department (HOSPITAL_COMMUNITY): Payer: Medicare Other

## 2015-11-18 DIAGNOSIS — R079 Chest pain, unspecified: Secondary | ICD-10-CM

## 2015-11-18 DIAGNOSIS — M542 Cervicalgia: Secondary | ICD-10-CM | POA: Diagnosis not present

## 2015-11-18 DIAGNOSIS — M549 Dorsalgia, unspecified: Secondary | ICD-10-CM | POA: Diagnosis present

## 2015-11-18 DIAGNOSIS — Z79891 Long term (current) use of opiate analgesic: Secondary | ICD-10-CM | POA: Diagnosis not present

## 2015-11-18 DIAGNOSIS — Z79899 Other long term (current) drug therapy: Secondary | ICD-10-CM | POA: Diagnosis not present

## 2015-11-18 DIAGNOSIS — R51 Headache: Secondary | ICD-10-CM | POA: Insufficient documentation

## 2015-11-18 DIAGNOSIS — R0781 Pleurodynia: Secondary | ICD-10-CM | POA: Diagnosis not present

## 2015-11-18 DIAGNOSIS — I10 Essential (primary) hypertension: Secondary | ICD-10-CM | POA: Insufficient documentation

## 2015-11-18 DIAGNOSIS — R519 Headache, unspecified: Secondary | ICD-10-CM

## 2015-11-18 LAB — BASIC METABOLIC PANEL
Anion gap: 9 (ref 5–15)
BUN: 24 mg/dL — AB (ref 6–20)
CALCIUM: 8.5 mg/dL — AB (ref 8.9–10.3)
CHLORIDE: 107 mmol/L (ref 101–111)
CO2: 25 mmol/L (ref 22–32)
CREATININE: 1.12 mg/dL — AB (ref 0.44–1.00)
GFR calc Af Amer: >60 mL/min (ref >60)
Glucose, Bld: 88 mg/dL (ref 65–99)
Potassium: 3.9 mmol/L (ref 3.5–5.1)
SODIUM: 141 mmol/L (ref 135–145)

## 2015-11-18 LAB — URINALYSIS, ROUTINE W REFLEX MICROSCOPIC
Bilirubin Urine: NEGATIVE
GLUCOSE, UA: NEGATIVE mg/dL
KETONES UR: NEGATIVE mg/dL
LEUKOCYTES UA: NEGATIVE
NITRITE: NEGATIVE
PH: 6 (ref 5.0–8.0)
Protein, ur: NEGATIVE mg/dL
SPECIFIC GRAVITY, URINE: 1.028 (ref 1.005–1.030)

## 2015-11-18 LAB — URINE MICROSCOPIC-ADD ON

## 2015-11-18 LAB — CBC WITH DIFFERENTIAL/PLATELET
BASOS PCT: 0 %
Basophils Absolute: 0 10*3/uL (ref 0.0–0.1)
EOS ABS: 0.1 10*3/uL (ref 0.0–0.7)
EOS PCT: 2 %
HEMATOCRIT: 37.5 % (ref 36.0–46.0)
Hemoglobin: 12.9 g/dL (ref 12.0–15.0)
LYMPHS ABS: 0.3 10*3/uL — AB (ref 0.7–4.0)
Lymphocytes Relative: 8 %
MCH: 33.1 pg (ref 26.0–34.0)
MCHC: 34.4 g/dL (ref 30.0–36.0)
MCV: 96.2 fL (ref 78.0–100.0)
Monocytes Absolute: 0.3 10*3/uL (ref 0.1–1.0)
Monocytes Relative: 8 %
Neutro Abs: 3 10*3/uL (ref 1.7–7.7)
Neutrophils Relative %: 82 %
Platelets: 252 10*3/uL (ref 150–400)
RBC: 3.9 MIL/uL (ref 3.87–5.11)
RDW: 13.3 % (ref 11.5–15.5)
WBC: 3.6 10*3/uL — AB (ref 4.0–10.5)

## 2015-11-18 LAB — POC URINE PREG, ED: PREG TEST UR: NEGATIVE

## 2015-11-18 LAB — TROPONIN I: Troponin I: <0.03 ng/mL (ref <0.03)

## 2015-11-18 MED ORDER — SODIUM CHLORIDE 0.9 % IV BOLUS (SEPSIS)
1000.0000 mL | Freq: Once | INTRAVENOUS | Status: AC
  Administered 2015-11-18: 1000 mL via INTRAVENOUS

## 2015-11-18 MED ORDER — LORAZEPAM 2 MG/ML IJ SOLN
1.0000 mg | Freq: Once | INTRAMUSCULAR | Status: AC
  Administered 2015-11-18: 1 mg via INTRAVENOUS
  Filled 2015-11-18: qty 1

## 2015-11-18 MED ORDER — SUMATRIPTAN SUCCINATE 100 MG PO TABS: 100.0000 mg | ORAL_TABLET | Freq: Once | ORAL | 0 refills | Status: DC | PRN

## 2015-11-18 MED ORDER — METOCLOPRAMIDE HCL 5 MG/ML IJ SOLN
10.0000 mg | Freq: Once | INTRAMUSCULAR | Status: AC
  Administered 2015-11-18: 10 mg via INTRAVENOUS
  Filled 2015-11-18: qty 2

## 2015-11-18 MED ORDER — DIPHENHYDRAMINE HCL 50 MG/ML IJ SOLN
25.0000 mg | Freq: Once | INTRAMUSCULAR | Status: AC
  Administered 2015-11-18: 25 mg via INTRAVENOUS
  Filled 2015-11-18: qty 1

## 2015-11-18 NOTE — ED Provider Notes (Signed)
Dawn Peters is a 35 y.o. female, with a history of MS, HTN, depression, and anxiety, presenting to the ED with a headache consistent with her previous headaches for the last 2 days. Chest pain beginning yesterday. Chest pain reproducible with palpation and range of motion of patient's arms.   Will Dansie, PA-C HPI: "Dawn Peters is a 35 y.o. Female with history of MS who presents to the emergency department complaining of migraine headache for 2 days, and chest pain since yesterday. Patient reports a history of migraines but has not had one recently. This feels similar to her previous migraines. She reports associated lightheadedness. She reports a frontal headache. She reports the pain has been constant for the past 2 days. No head injury or trauma. She also reports developing substernal nonradiating chest pain last night. She reports the pain is constant. Her pain is worse with touching and with stretching. Her pain is not worse with exertion. She also reports pain to her bilateral shoulders that radiates up into her neck. She reports some nausea and an episode of vomiting this morning. No abdominal pain. No treatments prior to arrival today. Patient denies fevers, shortness of breath, palpitations, leg pain, leg swelling, abdominal pain, diarrhea, numbness, tingling, weakness, double vision, neck stiffness, or rashes."   Past Medical History:  Diagnosis Date  . Anxiety   . Depression   . Hypertension   . MS (multiple sclerosis) ([facility])     Physical Exam  BP (!) 135/103   Pulse 86   Temp 98.3 F (36.8 C) (Oral)   Resp 18   Ht 5\' 5"  (1.651 m)   Wt 73.5 kg   LMP 11/17/2015 (Exact Date)   SpO2 91%   BMI 26.96 kg/m   Physical Exam  Constitutional: She appears well-developed and well-nourished. No distress.  HENT:  Head: Normocephalic and atraumatic.  Eyes: Conjunctivae are normal.  Neck: Neck supple.  Cardiovascular: Normal rate, regular rhythm, normal heart sounds and intact  distal pulses.   Pulmonary/Chest: Effort normal and breath sounds normal. No respiratory distress.  Abdominal: Soft. There is no tenderness. There is no guarding.  Musculoskeletal: She exhibits no edema or tenderness.  Lymphadenopathy:    She has no cervical adenopathy.  Neurological: She is alert.  Skin: Skin is warm and dry. She is not diaphoretic.  Psychiatric: She has a normal mood and affect. Her behavior is normal.  Nursing note and vitals reviewed.   ED Course  Procedures  MDM   5:59 AM Received patient care handoff report from Will Dansie, PA-C. Plan: Improve headache and d/c  Upon reevaluation, patient states that her headache is improved. Denies chest pain. Denies any additional complaints. Patient to follow-up with her provider that handles her headaches, Dr. Algie Coffer. Patient requests refill of her sumatriptan. The patient was given instructions for home care as well as return precautions. Patient voices understanding of these instructions, accepts the plan, and is comfortable with discharge.   Vitals:   11/18/15 0530 11/18/15 0545 11/18/15 0600 11/18/15 0715  BP: (!) 135/103  126/93   Pulse:  86 84 86  Resp:   18   Temp:      TempSrc:      SpO2: 100% 91% 99% 100%  Weight:      Height:           Lorayne Bender, PA-C 11/18/15 0757    Merryl Hacker, MD 11/18/15 [phone]

## 2015-11-18 NOTE — ED Notes (Signed)
Discharge instructions, follow up care, and rx x1 reviewed with patient. Patient verbalized understanding. 

## 2015-11-18 NOTE — Discharge Instructions (Signed)
You have been seen today for a headache. Your imaging and lab tests showed no abnormalities other than signs of dehydration. Follow up with PCP as soon as possible for reevaluation and chronic management of this issue. Return to ED should symptoms worsen. The sumatriptan has been refilled. Take this medication only as needed for headache.

## 2015-11-18 NOTE — ED Provider Notes (Signed)
[facility] DEPT Provider Note   CSN: [id number] Arrival date & time: 11/18/15  0443     History   Chief Complaint Chief Complaint  Patient presents with  . Chest Pain  . Migraine  . Back Pain    HPI Dawn Peters is a 35 y.o. female.  Dawn Peters is a 35 y.o. Female with history of MS who presents to the emergency department complaining of migraine headache for 2 days, and chest pain since yesterday. Patient reports a history of migraines but has not had one recently. This feels similar to her previous migraines. She reports associated lightheadedness. She reports a frontal headache. She reports the pain has been constant for the past 2 days. No head injury or trauma. She also reports developing substernal nonradiating chest pain last night. She reports the pain is constant. Her pain is worse with touching and with stretching. Her pain is not worse with exertion. She also reports pain to her bilateral shoulders that radiates up into her neck. She reports some nausea and an episode of vomiting this morning. No abdominal pain. No treatments prior to arrival today. Patient denies fevers, shortness of breath, palpitations, leg pain, leg swelling, abdominal pain, diarrhea, numbness, tingling, weakness, double vision, neck stiffness, or rashes.   The history is provided by the patient. No language interpreter was used.  Chest Pain   Associated symptoms include headaches. Pertinent negatives include no abdominal pain, no cough, no dizziness, no fever, no nausea, no numbness, no palpitations, no shortness of breath, no vomiting and no weakness.  Migraine  Associated symptoms include chest pain and headaches. Pertinent negatives include no abdominal pain and no shortness of breath.  Back Pain   Associated symptoms include chest pain and headaches. Pertinent negatives include no fever, no numbness, no abdominal pain, no dysuria and no weakness.    Past Medical History:  Diagnosis  Date  . Anxiety   . Depression   . Hypertension   . MS (multiple sclerosis) [facility])     Patient Active Problem List   Diagnosis Date Noted  . High risk medication use 09/15/2015  . Hip pain, bilateral 07/28/2015  . Urinary hesitancy 07/28/2015  . Multiple sclerosis exacerbation ([place]) 07/17/2015  . Urinary disorder 06/11/2015  . Gait disturbance 05/19/2015  . Numbness 05/19/2015  . Depression with anxiety 05/13/2015  . Other fatigue 05/13/2015  . Left knee pain 05/13/2015  . Avascular necrosis of bones of both hips ([place]) 05/13/2015  . Screening for HIV (human immunodeficiency virus) 07/08/2014  . Essential hypertension, benign 11/19/2013  . Anxiety state, unspecified 11/19/2013  . Screen for STD (sexually transmitted disease) 11/01/2013  . Routine gynecological examination 11/01/2013  . Oral contraceptive use 10/20/2011  . MULTIPLE SCLEROSIS 08/08/2007  . RASH AND OTHER NONSPECIFIC SKIN ERUPTION 06/22/2007  . AVASCULAR NECROSIS, FEMORAL HEAD 03/11/2006  . Essential hypertension 02/03/2006  . 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 DVT, HX OF 02/03/2006    Past Surgical History:  Procedure Laterality Date  . BILATERAL HIP ARTHROSCOPY Left 07/2013  . JOINT REPLACEMENT Bilateral     R 2004 and L 2005    OB History    Gravida Para Term Preterm AB Living   1 1 1     1    SAB TAB Ectopic Multiple Live Births           1       Home Medications    Prior to Admission medications   Medication Sig Start Date End Date Taking?  Authorizing Provider  amitriptyline (ELAVIL) 25 MG tablet Take one or two at bedtime Patient taking differently: Take 25 mg by mouth at bedtime as needed for sleep.  10/30/15  Yes Britt Bottom, MD  baclofen (LIORESAL) 20 MG tablet Take 1 tablet (20 mg total) by mouth 3 (three) times daily as needed (for ms). 05/13/15  Yes Britt Bottom, MD  busPIRone (BUSPAR) 15 MG tablet Take 1 tablet (15 mg total) by mouth 3 (three) times daily. 07/09/15  Yes Britt Bottom, MD   citalopram (CELEXA) 40 MG tablet Take one tablet by mouth daily. Patient taking differently: Take 40 mg by mouth daily.  10/30/15  Yes Britt Bottom, MD  dalfampridine 10 MG TB12 Take 10 mg by mouth 2 (two) times daily.   Yes Historical Provider, MD  diclofenac (VOLTAREN) 75 MG EC tablet Take 1 tablet (75 mg total) by mouth 2 (two) times daily. 05/13/15  Yes Britt Bottom, MD  gabapentin (NEURONTIN) 800 MG tablet Take 1 tablet (800 mg total) by mouth 3 (three) times daily. 05/13/15  Yes Britt Bottom, MD  HYDROcodone-acetaminophen (NORCO/VICODIN) 5-325 MG tablet Take 1 tablet by mouth every 6 (six) hours as needed for moderate pain. 10/17/15  Yes Wardell Honour, MD  lamoTRIgine (LAMICTAL) 200 MG tablet Take 200 mg by mouth 2 (two) times daily. 11/01/15  Yes Historical Provider, MD  lisinopril (PRINIVIL,ZESTRIL) 20 MG tablet Take 1 tablet (20 mg total) by mouth daily. 07/22/15  Yes Britt Bottom, MD  Norgestimate-Ethinyl Estradiol Triphasic 0.18/0.215/0.25 MG-25 MCG tab Take 1 tablet by mouth daily. 12/18/14  Yes Woodroe Mode, MD  Oxcarbazepine (TRILEPTAL) 300 MG tablet Take 1 tablet (300 mg total) by mouth 2 (two) times daily. 11/11/15  Yes Britt Bottom, MD  tamsulosin (FLOMAX) 0.4 MG CAPS capsule Take 0.4 mg by mouth daily. 11/02/15  Yes Historical Provider, MD  tiZANidine (ZANAFLEX) 4 MG tablet Take 1 tablet (4 mg total) by mouth at bedtime. 05/13/15  Yes Britt Bottom, MD  traMADol (ULTRAM-ER) 300 MG 24 hr tablet Take 1 tablet (300 mg total) by mouth daily. 10/26/15  Yes Britt Bottom, MD  valACYclovir (VALTREX) 500 MG tablet Take 1 tablet (500 mg total) by mouth 2 (two) times daily. 11/02/15  Yes Britt Bottom, MD  cetirizine (ZYRTEC) 10 MG tablet Take 10 mg by mouth daily.  08/17/15   Historical Provider, MD  methylPREDNISolone (MEDROL DOSEPAK) 4 MG TBPK tablet Take 6 tablets on day 1, 5 on day 2, 4 on day 3, 3 on day 4, 2 on day 5, and 1 one day 6 Patient not taking: Reported on 11/18/2015  10/29/15   Britt Bottom, MD  mupirocin ointment (BACTROBAN) 2 % Apply 1 application topically 3 (three) times daily. Patient not taking: Reported on 11/18/2015 10/17/15   Wardell Honour, MD  SUMAtriptan (IMITREX) 100 MG tablet Take 1 tablet (100 mg total) by mouth once as needed for migraine. Patient not taking: Reported on 11/18/2015 05/13/15   Britt Bottom, MD    Family History Family History  Problem Relation Age of Onset  . Healthy Mother   . Healthy Father   . Breast cancer      paternal grandmother dx age 74  . Colon cancer      paternal grandmother  . Hypertension Other   . Diabetes Other     Social History Social History  Substance Use Topics  . Smoking status: Never Smoker  .  Smokeless tobacco: Never Used  . Alcohol use No     Allergies   Review of patient's allergies indicates no known allergies.   Review of Systems Review of Systems  Constitutional: Negative for chills and fever.  HENT: Negative for congestion and sore throat.   Eyes: Negative for visual disturbance.  Respiratory: Negative for cough, shortness of breath and wheezing.   Cardiovascular: Positive for chest pain. Negative for palpitations and leg swelling.  Gastrointestinal: Negative for abdominal pain, diarrhea, nausea and vomiting.  Genitourinary: Negative for difficulty urinating and dysuria.  Musculoskeletal: Positive for neck pain. Negative for neck stiffness.  Skin: Negative for rash.  Neurological: Positive for light-headedness and headaches. Negative for dizziness, syncope, speech difficulty, weakness and numbness.     Physical Exam Updated Vital Signs BP 119/77 (BP Location: Left Arm)   Pulse 102   Temp 98.3 F (36.8 C) (Oral)   Resp 18   Ht 5\' 5"  (1.651 m)   Wt 73.5 kg   LMP 11/17/2015 (Exact Date)   SpO2 100%   BMI 26.96 kg/m   Physical Exam  Constitutional: She is oriented to person, place, and time. She appears well-developed and well-nourished. No distress.    Nontoxic appearing.  HENT:  Head: Normocephalic and atraumatic.  Right Ear: External ear normal.  Left Ear: External ear normal.  Mouth/Throat: Oropharynx is clear and moist.  No temporal edema or tenderness. Bilateral tympanic membranes are pearly-gray without erythema or loss of landmarks.   Eyes: Conjunctivae and EOM are normal. Pupils are equal, round, and reactive to light. Right eye exhibits no discharge. Left eye exhibits no discharge.  Neck: Normal range of motion. Neck supple. No JVD present. No tracheal deviation present.  No meningeal signs. Good range of motion of her neck. Some tenderness along her bilateral trapezius musculature.  Cardiovascular: Normal rate, regular rhythm, normal heart sounds and intact distal pulses.  Exam reveals no gallop and no friction rub.   No murmur heard. Bilateral radial pulses are intact.  Pulmonary/Chest: Effort normal and breath sounds normal. No stridor. No respiratory distress. She has no wheezes. She has no rales. She exhibits tenderness.  Lungs are clear to auscultation bilaterally. Symmetric chest expansion bilaterally. Substernal chest wall is tender to palpation and reproduces her chest pain.  Abdominal: Soft. She exhibits no distension. There is no tenderness. There is no guarding.  Abdomen is soft and nontender to palpation.  Musculoskeletal: Normal range of motion. She exhibits no edema or tenderness.  No calf edema or tenderness bilaterally. Patient is spontaneously moving all extremities in a coordinated fashion exhibiting good strength.   Lymphadenopathy:    She has no cervical adenopathy.  Neurological: She is alert and oriented to person, place, and time. No cranial nerve deficit. Coordination normal.  Patient is alert and oriented 3. Cranial nerves are intact. Speech is clear and coherent. Finger to nose is intact. Normal gait. Sensation is intact in her bilateral upper and lower extremities.  Skin: Skin is warm and dry.  Capillary refill takes less than 2 seconds. No rash noted. She is not diaphoretic. No erythema. No pallor.  Psychiatric: She has a normal mood and affect. Her behavior is normal.  Nursing note and vitals reviewed.    ED Treatments / Results  Labs (all labs ordered are listed, but only abnormal results are displayed) Labs Reviewed  CBC WITH DIFFERENTIAL/PLATELET - Abnormal; Notable for the following:       Result Value   WBC 3.6 (*)  Lymphs Abs 0.3 (*)    All other components within normal limits  BASIC METABOLIC PANEL  URINALYSIS, ROUTINE W REFLEX MICROSCOPIC (NOT AT [facility])  TROPONIN I  POC URINE PREG, ED    EKG  EKG Interpretation  Date/Time:  Wednesday November 18 2015 04:51:22 EDT Ventricular Rate:  97 PR Interval:    QRS Duration: 95 QT Interval:  379 QTC Calculation: 482 R Axis:   53 Text Interpretation:  Sinus rhythm Baseline wander in lead(s) V6 Confirmed by Dina Rich  MD, COURTNEY ([id number]) on 11/18/2015 4:58:53 AM       Radiology Dg Chest 2 View  Result Date: 11/18/2015 CLINICAL DATA:  New midsternal chest pain radiating to the back. Two days duration. EXAM: CHEST  2 VIEW COMPARISON:  05/15/2015 FINDINGS: The heart size and mediastinal contours are within normal limits. Both lungs are clear. The visualized skeletal structures are unremarkable. IMPRESSION: No active cardiopulmonary disease. Electronically Signed   By: Andreas Newport M.D.   On: 11/18/2015 05:32    Procedures Procedures (including critical care time)  Medications Ordered in ED Medications  sodium chloride 0.9 % bolus 1,000 mL (1,000 mLs Intravenous New Bag/Given 11/18/15 0518)  metoCLOPramide (REGLAN) injection 10 mg (10 mg Intravenous Given 11/18/15 0518)  diphenhydrAMINE (BENADRYL) injection 25 mg (25 mg Intravenous Given 11/18/15 0519)  LORazepam (ATIVAN) injection 1 mg (1 mg Intravenous Given 11/18/15 0537)     Initial Impression / Assessment and Plan / ED Course  I have reviewed the triage  vital signs and the nursing notes.  Pertinent labs & imaging results that were available during my care of the patient were reviewed by me and considered in my medical decision making (see chart for details).  Clinical Course   This is a 35 y.o. Female with history of MS who presents to the emergency department complaining of migraine headache for 2 days, and chest pain since yesterday. Patient reports a history of migraines but has not had one recently. This feels similar to her previous migraines. She reports associated lightheadedness. She reports a frontal headache. She reports the pain has been constant for the past 2 days. No head injury or trauma. She also reports developing substernal nonradiating chest pain last night. She reports the pain is constant. Her pain is worse with touching and with stretching. Her pain is not worse with exertion. She also reports pain to her bilateral shoulders that radiates up into her neck. She reports some nausea and an episode of vomiting this morning. No abdominal pain, no SOB.  On exam the patient is afebrile and nontoxic appearing. She has no focal neurological deficits. Normal gait. She does have tenderness along her trapezius musculature bilaterally. No meningeal signs. Chest wall is tender to palpation and reproduces her chest pain. Lungs are clear to auscultation bilaterally. Low suspicion for ACS.  Will provide with migraine cocktail and initiate chest pain workup. EKG shows normal sinus rhythm. CBC is unremarkable. At shift change patient is awaiting blood work and reevaluation after migraine cocktail. Patient care signed out to Joy, PA-C at shift change who will disposition the patient.     Final Clinical Impressions(s) / ED Diagnoses   Final diagnoses:  Bad headache  Nonspecific chest pain    New Prescriptions New Prescriptions   No medications on file     Waynetta Pean, PA-C 11/18/15 0559    Merryl Hacker, MD 11/18/15 [phone]

## 2015-11-18 NOTE — ED Triage Notes (Signed)
Pt is c/o chest pain that started tonight  Pt states the pain is in the middle of her chest and is sharp   Pt is also c/o migraine headache that started 2 days ago  Pt states she has had nausea and vomiting tonight   Pt is also c/o back pain that started 2 days ago   Pt states it is up between her shoulder blades and makes her neck feel stiff   Pt states she has been feeling dizzy and weak

## 2015-11-18 NOTE — ED Notes (Signed)
Pt unable to provide urine specimen at this time

## 2015-11-19 ENCOUNTER — Other Ambulatory Visit: Payer: Self-pay | Admitting: Neurology

## 2015-11-21 ENCOUNTER — Other Ambulatory Visit: Payer: Self-pay | Admitting: Neurology

## 2015-11-23 ENCOUNTER — Other Ambulatory Visit: Payer: Self-pay | Admitting: Neurology

## 2015-11-24 ENCOUNTER — Telehealth: Payer: Self-pay | Admitting: *Deleted

## 2015-11-24 NOTE — Telephone Encounter (Signed)
Pt left message yesterday stating that she has a bacterial infection and requests Rx to be sent to her pharmacy.

## 2015-11-25 NOTE — Telephone Encounter (Signed)
This patient has not been seen here. I attempted to contact her but the number listed in chart is disconnected.

## 2015-12-04 ENCOUNTER — Telehealth: Payer: Self-pay | Admitting: *Deleted

## 2015-12-04 NOTE — Telephone Encounter (Signed)
I received a text from [name] with [name].  They have not been able to reach Aneita to set up reg. sched. [facility] labs.  I spoke with Terrisa this morning and advised she must contact them to sched. labs--I explained that a lab tech will come to her home.  She verbalized understanding of same, sts. she will call today.  I offered [facility] phone# but she sts. she has it saved in her phone.  I let Cara know pt. plans to call today/fim

## 2015-12-06 ENCOUNTER — Telehealth: Payer: Self-pay | Admitting: Neurology

## 2015-12-06 MED ORDER — PREDNISONE 5 MG PO TABS: ORAL_TABLET | ORAL | 0 refills | Status: DC

## 2015-12-06 NOTE — Telephone Encounter (Signed)
The patient called and indicated that her left leg was painful and she could not lift the leg well. The symptoms started today and she can still walk. She denies fever, dysuria, cough. I will call in a 6 day prednisone dosepack.

## 2015-12-17 ENCOUNTER — Encounter: Payer: Self-pay | Admitting: *Deleted

## 2015-12-21 ENCOUNTER — Telehealth: Payer: Self-pay | Admitting: Neurology

## 2015-12-21 ENCOUNTER — Inpatient Hospital Stay
Admission: EM | Admit: 2015-12-21 | Discharge: 2015-12-23 | DRG: 060 | Disposition: A | Discharge status: Home / Self Care | Payer: Medicare Other | Attending: Internal Medicine | Admitting: Internal Medicine

## 2015-12-21 ENCOUNTER — Telehealth: Payer: Self-pay | Admitting: General Practice

## 2015-12-21 ENCOUNTER — Encounter: Payer: Self-pay | Admitting: Emergency Medicine

## 2015-12-21 DIAGNOSIS — N760 Acute vaginitis: Secondary | ICD-10-CM | POA: Diagnosis present

## 2015-12-21 DIAGNOSIS — G35 Multiple sclerosis: Secondary | ICD-10-CM | POA: Diagnosis not present

## 2015-12-21 DIAGNOSIS — M6281 Muscle weakness (generalized): Secondary | ICD-10-CM

## 2015-12-21 DIAGNOSIS — R29898 Other symptoms and signs involving the musculoskeletal system: Secondary | ICD-10-CM | POA: Diagnosis not present

## 2015-12-21 DIAGNOSIS — E875 Hyperkalemia: Secondary | ICD-10-CM | POA: Diagnosis present

## 2015-12-21 DIAGNOSIS — B9689 Other specified bacterial agents as the cause of diseases classified elsewhere: Secondary | ICD-10-CM

## 2015-12-21 DIAGNOSIS — Z79899 Other long term (current) drug therapy: Secondary | ICD-10-CM

## 2015-12-21 DIAGNOSIS — F329 Major depressive disorder, single episode, unspecified: Secondary | ICD-10-CM | POA: Diagnosis present

## 2015-12-21 DIAGNOSIS — Z79891 Long term (current) use of opiate analgesic: Secondary | ICD-10-CM

## 2015-12-21 DIAGNOSIS — I10 Essential (primary) hypertension: Secondary | ICD-10-CM | POA: Diagnosis present

## 2015-12-21 DIAGNOSIS — Z833 Family history of diabetes mellitus: Secondary | ICD-10-CM

## 2015-12-21 DIAGNOSIS — F419 Anxiety disorder, unspecified: Secondary | ICD-10-CM | POA: Diagnosis present

## 2015-12-21 DIAGNOSIS — Z793 Long term (current) use of hormonal contraceptives: Secondary | ICD-10-CM

## 2015-12-21 DIAGNOSIS — R262 Difficulty in walking, not elsewhere classified: Secondary | ICD-10-CM

## 2015-12-21 DIAGNOSIS — Z8249 Family history of ischemic heart disease and other diseases of the circulatory system: Secondary | ICD-10-CM

## 2015-12-21 LAB — COMPREHENSIVE METABOLIC PANEL
ALBUMIN: 3.7 g/dL (ref 3.5–5.0)
ALK PHOS: 58 U/L (ref 38–126)
ALT: 11 U/L — AB (ref 14–54)
AST: 17 U/L (ref 15–41)
Anion gap: 6 (ref 5–15)
BILIRUBIN TOTAL: 0.4 mg/dL (ref 0.3–1.2)
BUN: 16 mg/dL (ref 6–20)
CALCIUM: 8.9 mg/dL (ref 8.9–10.3)
CO2: 31 mmol/L (ref 22–32)
CREATININE: 1.01 mg/dL — AB (ref 0.44–1.00)
Chloride: 105 mmol/L (ref 101–111)
GFR calc Af Amer: >60 mL/min (ref >60)
GFR calc non Af Amer: >60 mL/min (ref >60)
GLUCOSE: 97 mg/dL (ref 65–99)
Potassium: 3.4 mmol/L — ABNORMAL LOW (ref 3.5–5.1)
SODIUM: 142 mmol/L (ref 135–145)
TOTAL PROTEIN: 6.9 g/dL (ref 6.5–8.1)

## 2015-12-21 LAB — CBC WITH DIFFERENTIAL/PLATELET
BASOS ABS: 0 10*3/uL (ref 0–0.1)
BASOS PCT: 0 %
Eosinophils Absolute: 0.1 10*3/uL (ref 0–0.7)
Eosinophils Relative: 2 %
HEMATOCRIT: 37 % (ref 35.0–47.0)
HEMOGLOBIN: 12.8 g/dL (ref 12.0–16.0)
Lymphocytes Relative: 2 %
Lymphs Abs: 0.1 10*3/uL — ABNORMAL LOW (ref 1.0–3.6)
MCH: 34.1 pg — ABNORMAL HIGH (ref 26.0–34.0)
MCHC: 34.6 g/dL (ref 32.0–36.0)
MCV: 98.6 fL (ref 80.0–100.0)
MONOS PCT: 11 %
Monocytes Absolute: 0.5 10*3/uL (ref 0.2–0.9)
NEUTROS ABS: 4.1 10*3/uL (ref 1.4–6.5)
NEUTROS PCT: 85 %
Platelets: 252 10*3/uL (ref 150–440)
RBC: 3.76 MIL/uL — AB (ref 3.80–5.20)
RDW: 14.7 % — ABNORMAL HIGH (ref 11.5–14.5)
WBC: 4.9 10*3/uL (ref 3.6–11.0)

## 2015-12-21 MED ORDER — METRONIDAZOLE 500 MG PO TABS
500.0000 mg | ORAL_TABLET | Freq: Two times a day (BID) | ORAL | 0 refills | Status: DC
Start: 2015-12-21 — End: 2016-01-05

## 2015-12-21 MED ORDER — SODIUM CHLORIDE 0.9 % IV SOLN
1000.0000 mg | Freq: Once | INTRAVENOUS | Status: AC
  Administered 2015-12-21: 1000 mg via INTRAVENOUS
  Filled 2015-12-21: qty 8

## 2015-12-21 NOTE — Telephone Encounter (Signed)
Pt called in stating her left shoulder hurts and she can not lift her left leg. Pt says this started yesterday. Please call and advise [phone]

## 2015-12-21 NOTE — Telephone Encounter (Signed)
I have spoken with Dawn Peters this afternoon.  She c/o increased difficulty walking, believes this is related to certain shoes/clothing she is wearing. She has noit had [facility] labs drawn since her first yr. infusions.  Despite phone calls from our office, letter advising labs are needed. She will need to see RAS, and we can draw labs at the same time.

## 2015-12-21 NOTE — ED Triage Notes (Signed)
Pt to ED via EMS from home with c/o MS exacerbation with weakness to lower extremities bilaterally , started yesterday afternoon and worsening. Pt A&O. Per EMS pt can't stand on her own to get onto stretcher.

## 2015-12-21 NOTE — Telephone Encounter (Signed)
Patient called and left message stating she called 3 weeks ago but didn't receive a call back. She is requesting a medication for a bacterial infection. Called patient and asked what symptoms she has been experiencing. Patient reports light d/c with odor. Told patient we will send in a prescription to her pharmacy and discussed directions & to avoid alcohol. Patient verbalized understanding & had no questions

## 2015-12-21 NOTE — ED Provider Notes (Signed)
[facility] Emergency Department Provider Note    First MD Initiated Contact with Patient 12/21/15 2202     (approximate)  I have reviewed the triage vital signs and the nursing notes.   HISTORY  Chief Complaint Multiple Sclerosis and Weakness    HPI Dawn Peters is a 35 y.o. female with a history of MS presents with 5 days of worsening left lower extremity weakness and difficulty ambulating that is not improving after being started on prednisone taper. Patient denies any fevers. No recent injuries. States that she will occasionally get a flare every few years and requires steroids for improvement. Denies any numbness or tingling but has had worsening of ambulation and unsteady gait. Patient denies any dysuria, chest pain, sob, pelvic pain, vaginal discharge, diarrhea, constipation.   Past Medical History:  Diagnosis Date  . Anxiety   . Depression   . Hypertension   . MS (multiple sclerosis) [facility])     Patient Active Problem List   Diagnosis Date Noted  . High risk medication use 09/15/2015  . Hip pain, bilateral 07/28/2015  . Urinary hesitancy 07/28/2015  . Multiple sclerosis exacerbation ([place]) 07/17/2015  . Urinary disorder 06/11/2015  . Gait disturbance 05/19/2015  . Numbness 05/19/2015  . Depression with anxiety 05/13/2015  . Other fatigue 05/13/2015  . Left knee pain 05/13/2015  . Avascular necrosis of bones of both hips ([place]) 05/13/2015  . Screening for HIV (human immunodeficiency virus) 07/08/2014  . Essential hypertension, benign 11/19/2013  . Anxiety state, unspecified 11/19/2013  . Screen for STD (sexually transmitted disease) 11/01/2013  . Routine gynecological examination 11/01/2013  . Oral contraceptive use 10/20/2011  . MULTIPLE SCLEROSIS 08/08/2007  . RASH AND OTHER NONSPECIFIC SKIN ERUPTION 06/22/2007  . AVASCULAR NECROSIS, FEMORAL HEAD 03/11/2006  . Essential hypertension 02/03/2006  . 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  DVT, HX OF 02/03/2006    Past Surgical History:  Procedure Laterality Date  . BILATERAL HIP ARTHROSCOPY Left 07/2013  . JOINT REPLACEMENT Bilateral     R 2004 and L 2005    Prior to Admission medications   Medication Sig Start Date End Date Taking? Authorizing Provider  amitriptyline (ELAVIL) 25 MG tablet Take one or two at bedtime Patient taking differently: Take 25 mg by mouth at bedtime as needed for sleep.  10/30/15   Britt Bottom, MD  baclofen (LIORESAL) 20 MG tablet Take 1 tablet (20 mg total) by mouth 3 (three) times daily as needed (for ms). 05/13/15   Britt Bottom, MD  busPIRone (BUSPAR) 15 MG tablet Take 1 tablet (15 mg total) by mouth 3 (three) times daily. 07/09/15   Britt Bottom, MD  cetirizine (ZYRTEC) 10 MG tablet Take 10 mg by mouth daily.  08/17/15   Historical Provider, MD  citalopram (CELEXA) 40 MG tablet Take one tablet by mouth daily. Patient taking differently: Take 40 mg by mouth daily.  10/30/15   Britt Bottom, MD  dalfampridine 10 MG TB12 Take 10 mg by mouth 2 (two) times daily.    Historical Provider, MD  diclofenac (VOLTAREN) 75 MG EC tablet Take 1 tablet (75 mg total) by mouth 2 (two) times daily. 05/13/15   Britt Bottom, MD  gabapentin (NEURONTIN) 800 MG tablet Take 1 tablet (800 mg total) by mouth 3 (three) times daily. 05/13/15   Britt Bottom, MD  HYDROcodone-acetaminophen (NORCO/VICODIN) 5-325 MG tablet Take 1 tablet by mouth every 6 (six) hours as needed for moderate pain. 10/17/15  Wardell Honour, MD  lamoTRIgine (LAMICTAL) 200 MG tablet Take 200 mg by mouth 2 (two) times daily. 11/01/15   Historical Provider, MD  lisinopril (PRINIVIL,ZESTRIL) 20 MG tablet Take 1 tablet (20 mg total) by mouth daily. 07/22/15   Britt Bottom, MD  metroNIDAZOLE (FLAGYL) 500 MG tablet Take 1 tablet (500 mg total) by mouth 2 (two) times daily. 12/21/15   Truett Mainland, DO  mupirocin ointment (BACTROBAN) 2 % Apply 1 application topically 3 (three) times daily. Patient  not taking: Reported on 11/18/2015 10/17/15   Wardell Honour, MD  Norgestimate-Ethinyl Estradiol Triphasic 0.18/0.215/0.25 MG-25 MCG tab Take 1 tablet by mouth daily. 12/18/14   Woodroe Mode, MD  Oxcarbazepine (TRILEPTAL) 300 MG tablet Take 1 tablet (300 mg total) by mouth 2 (two) times daily. 11/11/15   Britt Bottom, MD  predniSONE (DELTASONE) 5 MG tablet Begin taking 6 tablets daily, taper by one tablet daily until off the medication. 12/06/15   Kathrynn Ducking, MD  SUMAtriptan (IMITREX) 100 MG tablet Take 1 tablet (100 mg total) by mouth once as needed for migraine. May repeat in 2 hours if headache persists or recurs. 11/18/15   Shawn C Joy, PA-C  tamsulosin (FLOMAX) 0.4 MG CAPS capsule Take 0.4 mg by mouth daily. 11/02/15   Historical Provider, MD  tiZANidine (ZANAFLEX) 4 MG tablet Take 1 tablet (4 mg total) by mouth at bedtime. 05/13/15   Britt Bottom, MD  traMADol (ULTRAM-ER) 300 MG 24 hr tablet Take 1 tablet (300 mg total) by mouth daily. 10/26/15   Britt Bottom, MD  valACYclovir (VALTREX) 500 MG tablet Take 1 tablet (500 mg total) by mouth 2 (two) times daily. 11/02/15   Britt Bottom, MD    Allergies Review of patient's allergies indicates no known allergies.  Family History  Problem Relation Age of Onset  . Healthy Mother   . Healthy Father   . Breast cancer      paternal grandmother dx age 54  . Colon cancer      paternal grandmother  . Hypertension Other   . Diabetes Other     Social History Social History  Substance Use Topics  . Smoking status: Never Smoker  . Smokeless tobacco: Never Used  . Alcohol use No    Review of Systems Patient denies headaches, rhinorrhea, blurry vision, numbness, shortness of breath, chest pain, edema, cough, abdominal pain, nausea, vomiting, diarrhea, dysuria, fevers, rashes or hallucinations unless otherwise stated above in HPI. ____________________________________________   PHYSICAL EXAM:  VITAL SIGNS: Vitals:   12/21/15 2148    BP: (!) 125/96  Pulse: (!) 102  Resp: 16  Temp: 98.1 F (36.7 C)    Constitutional: Alert and oriented. Well appearing and in no acute distress. Eyes: Conjunctivae are normal. PERRL. EOMI. Head: Atraumatic. Nose: No congestion/rhinnorhea. Mouth/Throat: Mucous membranes are moist.  Oropharynx non-erythematous. Neck: No stridor. Painless ROM. No cervical spine tenderness to palpation Hematological/Lymphatic/Immunilogical: No cervical lymphadenopathy. Cardiovascular: Normal rate, regular rhythm. Grossly normal heart sounds.  Good peripheral circulation. Respiratory: Normal respiratory effort.  No retractions. Lungs CTAB. Gastrointestinal: Soft and nontender. No distention. No abdominal bruits. No CVA tenderness. Genitourinary:  Musculoskeletal: No lower extremity tenderness nor edema.  No joint effusions. Neurologic:  Normal speech and language. No gross focal neurologic deficits are appreciated. lle weakness 2/5,  lle 3 beat clonus,  Skin:  Skin is warm, dry and intact. No rash noted. Psychiatric: Mood and affect are normal. Speech and behavior are  normal.  ____________________________________________   LABS (all labs ordered are listed, but only abnormal results are displayed)  Results for orders placed or performed during the hospital encounter of 12/21/15 (from the past 24 hour(s))  CBC with Differential/Platelet     Status: Abnormal   Collection Time: 12/21/15 10:44 PM  Result Value Ref Range   WBC 4.9 3.6 - 11.0 K/uL   RBC 3.76 (L) 3.80 - 5.20 MIL/uL   Hemoglobin 12.8 12.0 - 16.0 g/dL   HCT 37.0 35.0 - 47.0 %   MCV 98.6 80.0 - 100.0 fL   MCH 34.1 (H) 26.0 - 34.0 pg   MCHC 34.6 32.0 - 36.0 g/dL   RDW 14.7 (H) 11.5 - 14.5 %   Platelets 252 150 - 440 K/uL   Neutrophils Relative % 85 %   Neutro Abs 4.1 1.4 - 6.5 K/uL   Lymphocytes Relative 2 %   Lymphs Abs 0.1 (L) 1.0 - 3.6 K/uL   Monocytes Relative 11 %   Monocytes Absolute 0.5 0.2 - 0.9 K/uL   Eosinophils Relative 2  %   Eosinophils Absolute 0.1 0 - 0.7 K/uL   Basophils Relative 0 %   Basophils Absolute 0.0 0 - 0.1 K/uL  Comprehensive metabolic panel     Status: Abnormal   Collection Time: 12/21/15 10:44 PM  Result Value Ref Range   Sodium 142 135 - 145 mmol/L   Potassium 3.4 (L) 3.5 - 5.1 mmol/L   Chloride 105 101 - 111 mmol/L   CO2 31 22 - 32 mmol/L   Glucose, Bld 97 65 - 99 mg/dL   BUN 16 6 - 20 mg/dL   Creatinine, Ser 1.01 (H) 0.44 - 1.00 mg/dL   Calcium 8.9 8.9 - 10.3 mg/dL   Total Protein 6.9 6.5 - 8.1 g/dL   Albumin 3.7 3.5 - 5.0 g/dL   AST 17 15 - 41 U/L   ALT 11 (L) 14 - 54 U/L   Alkaline Phosphatase 58 38 - 126 U/L   Total Bilirubin 0.4 0.3 - 1.2 mg/dL   GFR calc non Af Amer >60 >60 mL/min   GFR calc Af Amer >60 >60 mL/min   Anion gap 6 5 - 15   ____________________________________________ _______________________________   PROCEDURES  Procedure(s) performed: none    Critical Care performed: no ____________________________________________   INITIAL IMPRESSION / ASSESSMENT AND PLAN / ED COURSE  Pertinent labs & imaging results that were available during my care of the patient were reviewed by me and considered in my medical decision making (see chart for details).  DDX: ms flare, electrolyte abn, stroke  Dawn Peters is a 35 y.o. who presents to the ED with Complaint of recurrent left lower extremity weakness that is similar to previous MS flares that is getting worse despite outpatient management with prednisone. Patient denies any recent fevers. No other focal neurologic deficits on exam. Based on her history of MS and no change in character do not feel repeat emergent neuro imaging clinically indicated. I spoke with neurology on call at [facility] regarding her presentation and they recommended high-dose IV steroids. Blood work is otherwise reassuring. Patient denies any other complaints at this time.  Have discussed with the patient and available family all  diagnostics and treatments performed thus far and all questions were answered to the best of my ability. The patient demonstrates understanding and agreement with plan.   Clinical Course     ____________________________________________   FINAL CLINICAL IMPRESSION(S) / ED DIAGNOSES  Final diagnoses:  Multiple sclerosis exacerbation ([place])  Left leg weakness      NEW MEDICATIONS STARTED DURING THIS VISIT:  New Prescriptions   No medications on file     Note:  This document was prepared using Dragon voice recognition software and may include unintentional dictation errors.    Merlyn Lot, MD 12/21/15 [phone]

## 2015-12-22 ENCOUNTER — Ambulatory Visit: Payer: Self-pay | Admitting: Neurology

## 2015-12-22 DIAGNOSIS — R29898 Other symptoms and signs involving the musculoskeletal system: Secondary | ICD-10-CM | POA: Diagnosis present

## 2015-12-22 DIAGNOSIS — F329 Major depressive disorder, single episode, unspecified: Secondary | ICD-10-CM | POA: Diagnosis present

## 2015-12-22 DIAGNOSIS — Z79891 Long term (current) use of opiate analgesic: Secondary | ICD-10-CM | POA: Diagnosis not present

## 2015-12-22 DIAGNOSIS — Z793 Long term (current) use of hormonal contraceptives: Secondary | ICD-10-CM | POA: Diagnosis not present

## 2015-12-22 DIAGNOSIS — E875 Hyperkalemia: Secondary | ICD-10-CM | POA: Diagnosis present

## 2015-12-22 DIAGNOSIS — I10 Essential (primary) hypertension: Secondary | ICD-10-CM | POA: Diagnosis present

## 2015-12-22 DIAGNOSIS — Z8249 Family history of ischemic heart disease and other diseases of the circulatory system: Secondary | ICD-10-CM | POA: Diagnosis not present

## 2015-12-22 DIAGNOSIS — N760 Acute vaginitis: Secondary | ICD-10-CM | POA: Diagnosis present

## 2015-12-22 DIAGNOSIS — G35 Multiple sclerosis: Principal | ICD-10-CM

## 2015-12-22 DIAGNOSIS — Z833 Family history of diabetes mellitus: Secondary | ICD-10-CM | POA: Diagnosis not present

## 2015-12-22 DIAGNOSIS — Z79899 Other long term (current) drug therapy: Secondary | ICD-10-CM | POA: Diagnosis not present

## 2015-12-22 DIAGNOSIS — F419 Anxiety disorder, unspecified: Secondary | ICD-10-CM | POA: Diagnosis present

## 2015-12-22 LAB — BASIC METABOLIC PANEL
Anion gap: 7 (ref 5–15)
BUN: 15 mg/dL (ref 6–20)
CALCIUM: 8.6 mg/dL — AB (ref 8.9–10.3)
CO2: 26 mmol/L (ref 22–32)
Chloride: 107 mmol/L (ref 101–111)
Creatinine, Ser: 0.98 mg/dL (ref 0.44–1.00)
GFR calc Af Amer: >60 mL/min (ref >60)
Glucose, Bld: 138 mg/dL — ABNORMAL HIGH (ref 65–99)
POTASSIUM: 3.9 mmol/L (ref 3.5–5.1)
SODIUM: 140 mmol/L (ref 135–145)

## 2015-12-22 LAB — CBC
HEMATOCRIT: 35.3 % (ref 35.0–47.0)
Hemoglobin: 12.5 g/dL (ref 12.0–16.0)
MCH: 35 pg — ABNORMAL HIGH (ref 26.0–34.0)
MCHC: 35.3 g/dL (ref 32.0–36.0)
MCV: 99.1 fL (ref 80.0–100.0)
PLATELETS: 228 10*3/uL (ref 150–440)
RBC: 3.57 MIL/uL — ABNORMAL LOW (ref 3.80–5.20)
RDW: 15 % — AB (ref 11.5–14.5)
WBC: 5.2 10*3/uL (ref 3.6–11.0)

## 2015-12-22 LAB — URINALYSIS COMPLETE WITH MICROSCOPIC (ARMC ONLY)
Bilirubin Urine: NEGATIVE
Glucose, UA: NEGATIVE mg/dL
Hgb urine dipstick: NEGATIVE
Ketones, ur: NEGATIVE mg/dL
Leukocytes, UA: NEGATIVE
Nitrite: NEGATIVE
PH: 5 (ref 5.0–8.0)
PROTEIN: NEGATIVE mg/dL
Specific Gravity, Urine: 1.023 (ref 1.005–1.030)

## 2015-12-22 LAB — MRSA PCR SCREENING: MRSA BY PCR: NEGATIVE

## 2015-12-22 LAB — MAGNESIUM: MAGNESIUM: 2.2 mg/dL (ref 1.7–2.4)

## 2015-12-22 LAB — PHOSPHORUS: PHOSPHORUS: 2.6 mg/dL (ref 2.5–4.6)

## 2015-12-22 MED ORDER — ENOXAPARIN SODIUM 40 MG/0.4ML ~~LOC~~ SOLN
40.0000 mg | SUBCUTANEOUS | Status: DC
  Administered 2015-12-22: 40 mg via SUBCUTANEOUS
  Filled 2015-12-22: qty 0.4

## 2015-12-22 MED ORDER — MAGNESIUM CITRATE PO SOLN
1.0000 | Freq: Once | ORAL | Status: DC | PRN
  Filled 2015-12-22: qty 296

## 2015-12-22 MED ORDER — SODIUM CHLORIDE 0.9 % IV SOLN
1000.0000 mg | Freq: Every day | INTRAVENOUS | Status: DC
  Administered 2015-12-22: 1000 mg via INTRAVENOUS
  Filled 2015-12-22 (×2): qty 8

## 2015-12-22 MED ORDER — TRAMADOL HCL ER 300 MG PO TB24: 300.0000 mg | ORAL_TABLET | Freq: Every day | ORAL | Status: DC

## 2015-12-22 MED ORDER — BACLOFEN 10 MG PO TABS
20.0000 mg | ORAL_TABLET | Freq: Three times a day (TID) | ORAL | Status: DC | PRN
  Administered 2015-12-23: 20 mg via ORAL
  Filled 2015-12-22: qty 2

## 2015-12-22 MED ORDER — AMITRIPTYLINE HCL 25 MG PO TABS
25.0000 mg | ORAL_TABLET | Freq: Every evening | ORAL | Status: DC | PRN
  Administered 2015-12-22: 25 mg via ORAL
  Filled 2015-12-22: qty 1

## 2015-12-22 MED ORDER — BISACODYL 5 MG PO TBEC: 5.0000 mg | DELAYED_RELEASE_TABLET | Freq: Every day | ORAL | Status: DC | PRN

## 2015-12-22 MED ORDER — LAMOTRIGINE 25 MG PO TABS
200.0000 mg | ORAL_TABLET | Freq: Two times a day (BID) | ORAL | Status: DC
  Administered 2015-12-22 – 2015-12-23 (×3): 200 mg via ORAL
  Filled 2015-12-22 (×3): qty 8

## 2015-12-22 MED ORDER — SUMATRIPTAN SUCCINATE 50 MG PO TABS
100.0000 mg | ORAL_TABLET | Freq: Once | ORAL | Status: DC | PRN
  Administered 2015-12-23: 100 mg via ORAL
  Filled 2015-12-22: qty 2

## 2015-12-22 MED ORDER — ONDANSETRON HCL 4 MG PO TABS
4.0000 mg | ORAL_TABLET | Freq: Four times a day (QID) | ORAL | Status: DC | PRN
  Administered 2015-12-22: 4 mg via ORAL
  Filled 2015-12-22: qty 1

## 2015-12-22 MED ORDER — ACETAMINOPHEN 325 MG PO TABS: 650.0000 mg | ORAL_TABLET | Freq: Four times a day (QID) | ORAL | Status: DC | PRN

## 2015-12-22 MED ORDER — ONDANSETRON HCL 4 MG/2ML IJ SOLN: 4.0000 mg | Freq: Four times a day (QID) | INTRAMUSCULAR | Status: DC | PRN

## 2015-12-22 MED ORDER — NORGESTIM-ETH ESTRAD TRIPHASIC 0.18/0.215/0.25 MG-25 MCG PO TABS: 1.0000 | ORAL_TABLET | Freq: Every day | ORAL | Status: DC

## 2015-12-22 MED ORDER — CITALOPRAM HYDROBROMIDE 20 MG PO TABS
40.0000 mg | ORAL_TABLET | Freq: Every day | ORAL | Status: DC
  Administered 2015-12-22 – 2015-12-23 (×2): 40 mg via ORAL
  Filled 2015-12-22 (×2): qty 2

## 2015-12-22 MED ORDER — SODIUM CHLORIDE 0.9 % IV SOLN
INTRAVENOUS | Status: DC
  Administered 2015-12-22 (×2): via INTRAVENOUS

## 2015-12-22 MED ORDER — MUPIROCIN 2 % EX OINT
1.0000 "application " | TOPICAL_OINTMENT | Freq: Three times a day (TID) | CUTANEOUS | Status: DC
  Administered 2015-12-22: 1 via TOPICAL
  Filled 2015-12-22: qty 22

## 2015-12-22 MED ORDER — NAPROXEN 500 MG PO TABS: 500.0000 mg | ORAL_TABLET | Freq: Two times a day (BID) | ORAL | Status: DC | PRN

## 2015-12-22 MED ORDER — METRONIDAZOLE 500 MG PO TABS
500.0000 mg | ORAL_TABLET | Freq: Two times a day (BID) | ORAL | Status: DC
  Administered 2015-12-22 – 2015-12-23 (×3): 500 mg via ORAL
  Filled 2015-12-22 (×3): qty 1

## 2015-12-22 MED ORDER — TAMSULOSIN HCL 0.4 MG PO CAPS
0.4000 mg | ORAL_CAPSULE | Freq: Every day | ORAL | Status: DC
  Administered 2015-12-22 – 2015-12-23 (×2): 0.4 mg via ORAL
  Filled 2015-12-22 (×2): qty 1

## 2015-12-22 MED ORDER — TRAMADOL HCL 50 MG PO TABS
75.0000 mg | ORAL_TABLET | Freq: Four times a day (QID) | ORAL | Status: DC
  Administered 2015-12-22 – 2015-12-23 (×6): 75 mg via ORAL
  Filled 2015-12-22 (×6): qty 2

## 2015-12-22 MED ORDER — ACETAMINOPHEN 650 MG RE SUPP: 650.0000 mg | Freq: Four times a day (QID) | RECTAL | Status: DC | PRN

## 2015-12-22 MED ORDER — SENNOSIDES-DOCUSATE SODIUM 8.6-50 MG PO TABS: 1.0000 | ORAL_TABLET | Freq: Every evening | ORAL | Status: DC | PRN

## 2015-12-22 MED ORDER — ZOLPIDEM TARTRATE 5 MG PO TABS: 5.0000 mg | ORAL_TABLET | Freq: Every evening | ORAL | Status: DC | PRN

## 2015-12-22 MED ORDER — TIZANIDINE HCL 4 MG PO TABS
4.0000 mg | ORAL_TABLET | Freq: Every day | ORAL | Status: DC
  Administered 2015-12-22: 4 mg via ORAL
  Filled 2015-12-22 (×3): qty 1

## 2015-12-22 MED ORDER — HYDROCODONE-ACETAMINOPHEN 5-325 MG PO TABS
1.0000 | ORAL_TABLET | ORAL | Status: DC | PRN
  Administered 2015-12-22 (×2): 1 via ORAL
  Administered 2015-12-23: 2 via ORAL
  Administered 2015-12-23: 1 via ORAL
  Filled 2015-12-22 (×3): qty 1
  Filled 2015-12-22: qty 2

## 2015-12-22 MED ORDER — OXCARBAZEPINE 300 MG PO TABS
300.0000 mg | ORAL_TABLET | Freq: Two times a day (BID) | ORAL | Status: DC
  Administered 2015-12-22 – 2015-12-23 (×3): 300 mg via ORAL
  Filled 2015-12-22 (×3): qty 1

## 2015-12-22 MED ORDER — GABAPENTIN 400 MG PO CAPS
800.0000 mg | ORAL_CAPSULE | Freq: Three times a day (TID) | ORAL | Status: DC
  Administered 2015-12-22 – 2015-12-23 (×5): 800 mg via ORAL
  Filled 2015-12-22 (×5): qty 2

## 2015-12-22 MED ORDER — DALFAMPRIDINE ER 10 MG PO TB12: 10.0000 mg | ORAL_TABLET | Freq: Two times a day (BID) | ORAL | Status: DC

## 2015-12-22 MED ORDER — VALACYCLOVIR HCL 500 MG PO TABS
500.0000 mg | ORAL_TABLET | Freq: Two times a day (BID) | ORAL | Status: DC
  Administered 2015-12-22 – 2015-12-23 (×3): 500 mg via ORAL
  Filled 2015-12-22 (×3): qty 1

## 2015-12-22 MED ORDER — LISINOPRIL 20 MG PO TABS
20.0000 mg | ORAL_TABLET | Freq: Every day | ORAL | Status: DC
  Administered 2015-12-22 – 2015-12-23 (×2): 20 mg via ORAL
  Filled 2015-12-22 (×2): qty 1

## 2015-12-22 NOTE — H&P (Signed)
[place] @ [facility] Admission History and Physical Dawn Peters, D.O.  ---------------------------------------------------------------------------------------------------------------------   PATIENT NAMEKharissa Peters MR#: [id number] DATE OF BIRTH: September 18, 1980 DATE OF ADMISSION: 12/21/2015 PRIMARY CARE PHYSICIAN: [name], MD  REQUESTING/REFERRING PHYSICIAN: ED Dr. Quentin Cornwall  CHIEF COMPLAINT: Chief Complaint  Patient presents with  . Multiple Sclerosis  . Weakness    HISTORY OF PRESENT ILLNESS: Dawn Peters is a 35 y.o. female with a known history of anxiety, depression, hypertension, multiple sclerosis presents to the emergency department complaining of weakness.  Patient was in a usual state of health until about 5 days ago when she experienced gradually worsening lower extremity weakness left greater than right. She was started on a prednisone taper for an exacerbation of MS symptoms however her symptoms have not improved. She is now having difficulty with ambulation and activities of daily living..  Otherwise there has been no change in status. Patient has been taking medication as prescribed and there has been no recent change in medication or diet.  There has been no recent illness, travel or sick contacts.    Patient denies fevers/chills, weakness, dizziness, chest pain, shortness of breath, N/V/C/D, abdominal pain, dysuria/frequency, changes in mental status.   EMS/ED COURSE:   Patient received Solu-Medrol 1 g.  PAST MEDICAL HISTORY: Past Medical History:  Diagnosis Date  . Anxiety   . Depression   . Hypertension   . MS (multiple sclerosis) ([place])       PAST SURGICAL HISTORY: Past Surgical History:  Procedure Laterality Date  . BILATERAL HIP ARTHROSCOPY Left 07/2013  . JOINT REPLACEMENT Bilateral     R 2004 and L 2005      SOCIAL HISTORY: Social History  Substance Use Topics  . Smoking status: Never Smoker  . Smokeless tobacco:  Never Used  . Alcohol use No      FAMILY HISTORY: Family History  Problem Relation Age of Onset  . Healthy Mother   . Healthy Father   . Breast cancer      paternal grandmother dx age 36  . Colon cancer      paternal grandmother  . Hypertension Other   . Diabetes Other      MEDICATIONS AT HOME: Prior to Admission medications   Medication Sig Start Date End Date Taking? Authorizing Provider  Alemtuzumab (LEMTRADA) 12 MG/1.2ML SOLN Inject 1 Dose into the vein as directed.   Yes Historical Provider, MD  amitriptyline (ELAVIL) 25 MG tablet Take one or two at bedtime Patient taking differently: Take 25 mg by mouth at bedtime as needed for sleep.  10/30/15  Yes Britt Bottom, MD  baclofen (LIORESAL) 20 MG tablet Take 1 tablet (20 mg total) by mouth 3 (three) times daily as needed (for ms). 05/13/15  Yes Britt Bottom, MD  cetirizine (ZYRTEC) 10 MG tablet Take 10 mg by mouth daily.  08/17/15  Yes Historical Provider, MD  citalopram (CELEXA) 40 MG tablet Take one tablet by mouth daily. Patient taking differently: Take 40 mg by mouth daily.  10/30/15  Yes Britt Bottom, MD  gabapentin (NEURONTIN) 800 MG tablet Take 1 tablet (800 mg total) by mouth 3 (three) times daily. 05/13/15  Yes Britt Bottom, MD  lamoTRIgine (LAMICTAL) 200 MG tablet Take 200 mg by mouth 2 (two) times daily. 11/01/15  Yes Historical Provider, MD  lisinopril (PRINIVIL,ZESTRIL) 20 MG tablet Take 1 tablet (20 mg total) by mouth daily. 07/22/15  Yes Britt Bottom, MD  metroNIDAZOLE (FLAGYL) 500  MG tablet Take 1 tablet (500 mg total) by mouth 2 (two) times daily. 12/21/15  Yes Tanna Savoy Stinson, DO  Norgestimate-Ethinyl Estradiol Triphasic 0.18/0.215/0.25 MG-25 MCG tab Take 1 tablet by mouth daily. 12/18/14  Yes Woodroe Mode, MD  Oxcarbazepine (TRILEPTAL) 300 MG tablet Take 1 tablet (300 mg total) by mouth 2 (two) times daily. 11/11/15  Yes Britt Bottom, MD  SUMAtriptan (IMITREX) 100 MG tablet Take 1 tablet (100 mg total)  by mouth once as needed for migraine. May repeat in 2 hours if headache persists or recurs. 11/18/15  Yes Shawn C Joy, PA-C  tamsulosin (FLOMAX) 0.4 MG CAPS capsule Take 0.4 mg by mouth daily. 11/02/15  Yes Historical Provider, MD  tiZANidine (ZANAFLEX) 4 MG tablet Take 1 tablet (4 mg total) by mouth at bedtime. 05/13/15  Yes Britt Bottom, MD  traMADol (ULTRAM-ER) 300 MG 24 hr tablet Take 1 tablet (300 mg total) by mouth daily. 10/26/15  Yes Britt Bottom, MD  valACYclovir (VALTREX) 500 MG tablet Take 1 tablet (500 mg total) by mouth 2 (two) times daily. 11/02/15  Yes Britt Bottom, MD  dalfampridine 10 MG TB12 Take 10 mg by mouth 2 (two) times daily.    Historical Provider, MD  HYDROcodone-acetaminophen (NORCO/VICODIN) 5-325 MG tablet Take 1 tablet by mouth every 8 (eight) hours as needed. 11/25/15   Historical Provider, MD  mupirocin ointment (BACTROBAN) 2 % Apply 1 application topically 3 (three) times daily as needed. 10/17/15   Historical Provider, MD  naproxen (NAPROSYN) 500 MG tablet Take 500 mg by mouth 2 (two) times daily as needed. 11/16/15   Historical Provider, MD  predniSONE (DELTASONE) 5 MG tablet Begin taking 6 tablets daily, taper by one tablet daily until off the medication. Patient not taking: Reported on 12/22/2015 12/06/15   Kathrynn Ducking, MD      DRUG ALLERGIES: No Known Allergies   REVIEW OF SYSTEMS: CONSTITUTIONAL: No fatigue, weakness, fever, chills, weight gain/loss, headache EYES: No blurry or double vision. ENT: No tinnitus, postnasal drip, redness or soreness of the oropharynx. RESPIRATORY: No dyspnea, cough, wheeze, hemoptysis. CARDIOVASCULAR: No chest pain, orthopnea, palpitations, syncope. GASTROINTESTINAL: No nausea, vomiting, constipation, diarrhea, abdominal pain. No hematemesis, melena or hematochezia. GENITOURINARY: No dysuria, frequency, hematuria. ENDOCRINE: No polyuria or nocturia. No heat or cold intolerance. HEMATOLOGY: No anemia, bruising,  bleeding. INTEGUMENTARY: No rashes, ulcers, lesions. MUSCULOSKELETAL: No pain, arthritis, swelling, gout. NEUROLOGIC: No numbness, tingling, Positive weakness/ataxia. No seizure-type activity. PSYCHIATRIC: No anxiety, depression, insomnia.  PHYSICAL EXAMINATION: VITAL SIGNS: Blood pressure 124/86, pulse 98, temperature 98.1 F (36.7 C), temperature source Oral, resp. rate 16, height 5\' 5"  (1.651 m), weight 70.3 kg (155 lb), last menstrual period 12/03/2015, SpO2 100 %.  GENERAL: 35 y.o.-year-old black female patient, well-developed, well-nourished lying in the bed in no acute distress.  Pleasant and cooperative.   HEENT: Head atraumatic, normocephalic. Pupils equal, round, reactive to light and accommodation. No scleral icterus. Extraocular muscles intact. Oropharynx is clear. Mucus membranes moist. NECK: Supple, full range of motion. No JVD, no bruit heard. No cervical lymphadenopathy. CHEST: Normal breath sounds bilaterally. No wheezing, rales, rhonchi or crackles. No use of accessory muscles of respiration.  No reproducible chest wall tenderness.  CARDIOVASCULAR: S1, S2 normal. No murmurs, rubs, or gallops appreciated. Cap refill <2 seconds. ABDOMEN: Soft, nontender, nondistended. No rebound, guarding, rigidity. Normoactive bowel sounds present in all four quadrants. No organomegaly or mass. EXTREMITIES: Full range of motion. No pedal edema, cyanosis, or clubbing. NEUROLOGIC: Cranial  nerves II through XII are grossly intact with no focal sensorimotor deficit. Muscle strength to 5 in left lower extremity, 4-5 in right lower extremity. Upper extremity strength is 5/5 and symmetric. Sensation intact. Gait not checked. PSYCHIATRIC: The patient is alert and oriented x 3. Normal affect, mood, thought content. SKIN: Warm, dry, and intact without obvious rash, lesion, or ulcer.  LABORATORY PANEL:  CBC  Recent Labs Lab 12/21/15 2244  WBC 4.9  HGB 12.8  HCT 37.0  PLT 252    ----------------------------------------------------------------------------------------------------------------- Chemistries  Recent Labs Lab 12/21/15 2244  NA 142  K 3.4*  CL 105  CO2 31  GLUCOSE 97  BUN 16  CREATININE 1.01*  CALCIUM 8.9  AST 17  ALT 11*  ALKPHOS 58  BILITOT 0.4   ------------------------------------------------------------------------------------------------------------------ Cardiac Enzymes No results for input(s): TROPONINI in the last 168 hours. ------------------------------------------------------------------------------------------------------------------  RADIOLOGY: No results found.  IMPRESSION AND PLAN:  This is a 35 y.o. female with a history of anxiety, depression, hypertension, multiple sclerosis  now being admitted with: 1. Exacerbation of multiple sclerosis-admit for IV steroids 1 g daily, IV fluids, neurology consultation, PT evaluation. Continue baclofen, Neurontin, tramadol, Zanaflex, Flomax 2. Hyperkalemia, mild-we'll replace by mouth. 3. History of anxiety and depression-continue Elavil, Celexa, Lamictal, Trileptal 4. History of hypertension-continue lisinopril 5. History of pain-continue hydrocodone, Naprosyn, tramadol   Diet/Nutrition: Heart healthy Fluids: IV normal saline DVT Px: SCDs and early ambulation Code Status: Full  All the records are reviewed and case discussed with ED provider. Management plans discussed with the patient and/or family who express understanding and agree with plan of care.   TOTAL TIME TAKING CARE OF THIS PATIENT: 60 minutes.   Dawn Peters D.O. on 12/22/2015 at 1:23 AM Between 7am to 6pm - Pager - [phone] After 6pm go to [email or web address] - [employer] [place] [facility] Office [phone] CC: Primary care physician; [name], MD     Note: This dictation was prepared with Dragon dictation along with smaller phrase technology. Any transcriptional  errors that result from this process are unintentional.

## 2015-12-22 NOTE — Telephone Encounter (Signed)
[facility] is aware of pt's hospitalization/fim

## 2015-12-22 NOTE — Progress Notes (Signed)
[facility] [place] NAME: Dawn Peters    MR#:  [id number]  DATE OF BIRTH:  1980-06-23  SUBJECTIVE:  CHIEF COMPLAINT:   Chief Complaint  Patient presents with  . Multiple Sclerosis  . Weakness     Came again with leg weakness, on IV Steroids. Feels little better today.  REVIEW OF SYSTEMS:   CONSTITUTIONAL: No fever, fatigue or weakness.  EYES: No blurred or double vision.  EARS, NOSE, AND THROAT: No tinnitus or ear pain.  RESPIRATORY: No cough, shortness of breath, wheezing or hemoptysis.  CARDIOVASCULAR: No chest pain, orthopnea, edema.  GASTROINTESTINAL: No nausea, vomiting, diarrhea or abdominal pain.  GENITOURINARY: No dysuria, hematuria.  ENDOCRINE: No polyuria, nocturia,  HEMATOLOGY: No anemia, easy bruising or bleeding SKIN: No rash or lesion. MUSCULOSKELETAL: No joint pain or arthritis.   NEUROLOGIC: No tingling, numbness, b/l leg weakness.  PSYCHIATRY: No anxiety or depression.   ROS  DRUG ALLERGIES:  No Known Allergies  VITALS:  Blood pressure 116/75, pulse 92, temperature 97.9 F (36.6 C), temperature source Oral, resp. rate 18, height 5\' 5"  (1.651 m), weight 77.5 kg (170 lb 14.4 oz), last menstrual period 12/03/2015, SpO2 100 %.  PHYSICAL EXAMINATION:  GENERAL:  35 y.o.-year-old patient lying in the bed with no acute distress.  EYES: Pupils equal, round, reactive to light and accommodation. No scleral icterus. Extraocular muscles intact.  HEENT: Head atraumatic, normocephalic. Oropharynx and nasopharynx clear.  NECK:  Supple, no jugular venous distention. No thyroid enlargement, no tenderness.  LUNGS: Normal breath sounds bilaterally, no wheezing, rales,rhonchi or crepitation. No use of accessory muscles of respiration.  CARDIOVASCULAR: S1, S2 normal. No murmurs, rubs, or gallops.  ABDOMEN: Soft, nontender, nondistended. Bowel sounds present. No organomegaly or mass.  EXTREMITIES: No pedal edema, cyanosis, or  clubbing.  NEUROLOGIC: Cranial nerves II through XII are intact. Muscle strength 5/5 in upper extremities and 3-4/5 in lower extremities. Sensation intact. Gait not checked.  PSYCHIATRIC: The patient is alert and oriented x 3.  SKIN: No obvious rash, lesion, or ulcer.   Physical Exam LABORATORY PANEL:   CBC  Recent Labs Lab 12/22/15 0417  WBC 5.2  HGB 12.5  HCT 35.3  PLT 228   ------------------------------------------------------------------------------------------------------------------  Chemistries   Recent Labs Lab 12/21/15 2244 12/22/15 0417  NA 142 140  K 3.4* 3.9  CL 105 107  CO2 31 26  GLUCOSE 97 138*  BUN 16 15  CREATININE 1.01* 0.98  CALCIUM 8.9 8.6*  MG 2.2  --   AST 17  --   ALT 11*  --   ALKPHOS 58  --   BILITOT 0.4  --    ------------------------------------------------------------------------------------------------------------------  Cardiac Enzymes No results for input(s): TROPONINI in the last 168 hours. ------------------------------------------------------------------------------------------------------------------  RADIOLOGY:  No results found.  ASSESSMENT AND PLAN:   Active Problems:   Multiple sclerosis exacerbation (Dawn Peters)  This is a 35 y.o. female with a history of anxiety, depression, hypertension, multiple sclerosis  now being admitted with: 1. Exacerbation of multiple sclerosis-admit for IV steroids 1 g daily, IV fluids, neurology consultation, PT evaluation. Continue baclofen, Neurontin, tramadol, Zanaflex, Flomax 2. Hyperkalemia, mild-we'll replace by mouth. 3. History of anxiety and depression-continue Elavil, Celexa, Lamictal, Trileptal 4. History of hypertension-continue lisinopril 5. History of pain-continue hydrocodone, Naprosyn, tramadol  All the records are reviewed and case discussed with Care Management/Social Workerr. Management plans discussed with the patient, family and they are in agreement.  CODE STATUS:  Full.  TOTAL  TIME TAKING CARE OF THIS PATIENT: 35 minutes.     POSSIBLE D/C IN 1-2 DAYS, DEPENDING ON CLINICAL CONDITION.   Vaughan Basta M.D on 12/22/2015   Between 7am to 6pm - Pager - [phone]  After 6pm go to [email or web address] - password [email or web address] [facility]  Office  [phone]  CC: Primary care physician; [name], MD  Note: This dictation was prepared with Dragon dictation along with smaller phrase technology. Any transcriptional errors that result from this process are unintentional.

## 2015-12-22 NOTE — Evaluation (Signed)
Physical Therapy Evaluation Patient Details Name: Dawn Peters MRN: [id number] DOB: September 21, 1980 Today's Date: 12/22/2015   History of Present Illness  Dawn Peters is a 35 y.o. female with a known history of anxiety, depression, hypertension, multiple sclerosis presents to the emergency department complaining of weakness.  Patient was in a usual state of health until about 5 days ago when she experienced gradually worsening lower extremity weakness left greater than right. She was started on a prednisone taper for an exacerbation of MS symptoms however her symptoms have not improved. She is now having difficulty with ambulation and activities of daily living..  Clinical Impression  Pt presents to PT with below baseline functional mobility secondary to MS exacerbation.  Pt requiring extra time for bed mobility and transfers and is ambulating holding onto IV pole at this time due to L LE weakness.  Pt able to walk without IV pole, but with a slower cadence.  Recommend cane for mobility and HHPT for post-acute follow up.    Follow Up Recommendations Home health PT    Equipment Recommendations  Cane    Recommendations for Other Services       Precautions / Restrictions Precautions Precautions: Fall Restrictions Weight Bearing Restrictions: No      Mobility  Bed Mobility Overal bed mobility: Modified Independent             General bed mobility comments: increased time to move to EOB, uses UE to help lift L LE onto bed for sit>supine transfer  Transfers Overall transfer level: Modified independent Equipment used: None (IV pole)             General transfer comment: initial decreased balance upon standing; able to self correct with good judgement and safety awareness.  Ambulation/Gait Ambulation/Gait assistance: Supervision Ambulation Distance (Feet): 110 Feet Assistive device: 1 person hand held assist (IV pole) Gait Pattern/deviations: Decreased step length -  left;Decreased dorsiflexion - left;Decreased weight shift to left Gait velocity: decreased   General Gait Details: Slow gait speed with overal steady balance with use of IV pole; able to ambulate without IV pole, but with decreased speed.   L toe drag noted, pt reports this present prior to flare  Stairs            Wheelchair Mobility    Modified Rankin (Stroke Patients Only)       Balance Overall balance assessment: Modified Independent                                           Pertinent Vitals/Pain Pain Assessment: 0-10 Pain Location: L knee    Home Living Family/patient expects to be discharged to:: Private residence Living Arrangements: Spouse/significant other;Children Available Help at Discharge: Family Type of Home: House Home Access: Stairs to enter Entrance Stairs-Rails: Right Entrance Stairs-Number of Steps: 5 Home Layout: One level Home Equipment: None      Prior Function Level of Independence: Independent         Comments: MS flair in April with IV steriods, improved strength in 1-2 weeks, h/o B THA     Hand Dominance        Extremity/Trunk Assessment   Upper Extremity Assessment: Overall WFL for tasks assessed           Lower Extremity Assessment: LLE deficits/detail   LLE Deficits / Details: decreased strength, grossly 2/5, unable to SLR  Communication   Communication: No difficulties  Cognition Arousal/Alertness: Awake/alert Behavior During Therapy: WFL for tasks assessed/performed Overall Cognitive Status: Within Functional Limits for tasks assessed                      General Comments      Exercises     Assessment/Plan    PT Assessment Patient needs continued PT services  PT Problem List Decreased strength;Decreased balance;Decreased mobility          PT Treatment Interventions Gait training;Stair training;Functional mobility training;Therapeutic exercise;Balance training    PT  Goals (Current goals can be found in the Care Plan section)  Acute Rehab PT Goals Patient Stated Goal: To get stronger. PT Goal Formulation: With patient Time For Goal Achievement: 12/29/15 Potential to Achieve Goals: Good    Frequency Min 2X/week   Barriers to discharge        Co-evaluation               End of Session Equipment Utilized During Treatment: Gait belt Activity Tolerance: Patient tolerated treatment well Patient left: in bed;with call bell/phone within reach;with family/visitor present           Time: 1020-1044 PT Time Calculation (min) (ACUTE ONLY): 24 min   Charges:   PT Evaluation $PT Eval Low Complexity: 1 Procedure     PT G Codes:        Aiyden Lauderback A Shambhavi Salley, PT 12/22/2015, 11:02 AM

## 2015-12-22 NOTE — Telephone Encounter (Signed)
VM is full/fim 

## 2015-12-22 NOTE — Consult Note (Signed)
Reason for Consult:Lower extremity weakness Referring Physician: Anselm Jungling  CC: Lower extremity weakness  HPI: Dawn Peters is an 35 y.o. female with a history of MS followed by Dr. Felecia Shelling who reports that on yesterday she was wearing shoes she was not supposed to wear.  She started to note weakness that progressed throughout the day to the point that she was having difficulty walking.  Presented for evaluation at that time.   Patient under a lot of stress at this time and has a funeral coming up this week.   Was diagnosed with MS in 2000.  Has bladder complaints that are currently being addressed by Dr. Felecia Shelling.    Past Medical History:  Diagnosis Date  . Anxiety   . Depression   . Hypertension   . MS (multiple sclerosis) ([place])     Past Surgical History:  Procedure Laterality Date  . BILATERAL HIP ARTHROSCOPY Left 07/2013  . JOINT REPLACEMENT Bilateral     R 2004 and L 2005    Family History  Problem Relation Age of Onset  . Healthy Mother   . Healthy Father   . Breast cancer      paternal grandmother dx age 5  . Colon cancer      paternal grandmother  . Hypertension Other   . Diabetes Other     Social History:  reports that she has never smoked. She has never used smokeless tobacco. She reports that she does not drink alcohol or use drugs.  No Known Allergies  Medications:  I have reviewed the patient's current medications. Prior to Admission:  Prescriptions Prior to Admission  Medication Sig Dispense Refill Last Dose  . Alemtuzumab (LEMTRADA) 12 MG/1.2ML SOLN Inject 1 Dose into the vein as directed.     Marland Kitchen amitriptyline (ELAVIL) 25 MG tablet Take one or two at bedtime (Patient taking differently: Take 25 mg by mouth at bedtime as needed for sleep. ) 60 tablet 11 Past Week at Unknown time  . baclofen (LIORESAL) 20 MG tablet Take 1 tablet (20 mg total) by mouth 3 (three) times daily as needed (for ms). 90 each 11 Past Week at Unknown time  . cetirizine (ZYRTEC) 10 MG  tablet Take 10 mg by mouth daily.   0   . citalopram (CELEXA) 40 MG tablet Take one tablet by mouth daily. (Patient taking differently: Take 40 mg by mouth daily. ) 90 tablet 3 11/17/2015 at Unknown time  . gabapentin (NEURONTIN) 800 MG tablet Take 1 tablet (800 mg total) by mouth 3 (three) times daily. 90 tablet 11 11/17/2015 at Unknown time  . lamoTRIgine (LAMICTAL) 200 MG tablet Take 200 mg by mouth 2 (two) times daily.  11 11/17/2015 at Unknown time  . lisinopril (PRINIVIL,ZESTRIL) 20 MG tablet Take 1 tablet (20 mg total) by mouth daily. 90 tablet 1 11/17/2015 at Unknown time  . metroNIDAZOLE (FLAGYL) 500 MG tablet Take 1 tablet (500 mg total) by mouth 2 (two) times daily. 14 tablet 0   . Norgestimate-Ethinyl Estradiol Triphasic 0.18/0.215/0.25 MG-25 MCG tab Take 1 tablet by mouth daily. 1 Package 11 11/18/2015 at Unknown time  . Oxcarbazepine (TRILEPTAL) 300 MG tablet Take 1 tablet (300 mg total) by mouth 2 (two) times daily. 60 tablet 1 11/17/2015 at Unknown time  . SUMAtriptan (IMITREX) 100 MG tablet Take 1 tablet (100 mg total) by mouth once as needed for migraine. May repeat in 2 hours if headache persists or recurs. 10 tablet 0   . tamsulosin (FLOMAX) 0.4  MG CAPS capsule Take 0.4 mg by mouth daily.  11 11/17/2015 at Unknown time  . tiZANidine (ZANAFLEX) 4 MG tablet Take 1 tablet (4 mg total) by mouth at bedtime. 30 tablet 11 11/17/2015 at Unknown time  . traMADol (ULTRAM-ER) 300 MG 24 hr tablet Take 1 tablet (300 mg total) by mouth daily. 30 tablet 3 11/17/2015 at 1500  . valACYclovir (VALTREX) 500 MG tablet Take 1 tablet (500 mg total) by mouth 2 (two) times daily. 60 tablet 2 11/16/2015  . dalfampridine 10 MG TB12 Take 10 mg by mouth 2 (two) times daily.   Not Taking at Unknown time  . HYDROcodone-acetaminophen (NORCO/VICODIN) 5-325 MG tablet Take 1 tablet by mouth every 8 (eight) hours as needed.  0   . mupirocin ointment (BACTROBAN) 2 % Apply 1 application topically 3 (three) times daily as  needed.  0   . naproxen (NAPROSYN) 500 MG tablet Take 500 mg by mouth 2 (two) times daily as needed.  2   . predniSONE (DELTASONE) 5 MG tablet Begin taking 6 tablets daily, taper by one tablet daily until off the medication. (Patient not taking: Reported on 12/22/2015) 21 tablet 0 Completed Course at Unknown time   Scheduled: . citalopram  40 mg Oral Daily  . dalfampridine  10 mg Oral BID  . gabapentin  800 mg Oral TID  . lamoTRIgine  200 mg Oral BID  . lisinopril  20 mg Oral Daily  . methylPREDNISolone (SOLU-MEDROL) injection  1,000 mg Intravenous Daily  . metroNIDAZOLE  500 mg Oral BID  . mupirocin ointment  1 application Topical TID  . Norgestimate-Ethinyl Estradiol Triphasic  1 tablet Oral Daily  . Oxcarbazepine  300 mg Oral BID  . tamsulosin  0.4 mg Oral Daily  . tiZANidine  4 mg Oral QHS  . traMADol  75 mg Oral Q6H  . valACYclovir  500 mg Oral BID    ROS: History obtained from the patient  General ROS: negative for - chills, fatigue, fever, night sweats, weight gain or weight loss Psychological ROS: negative for - behavioral disorder, hallucinations, memory difficulties, mood swings or suicidal ideation Ophthalmic ROS: negative for - blurry vision, double vision, eye pain or loss of vision ENT ROS: negative for - epistaxis, nasal discharge, oral lesions, sore throat, tinnitus or vertigo Allergy and Immunology ROS: negative for - hives or itchy/watery eyes Hematological and Lymphatic ROS: negative for - bleeding problems, bruising or swollen lymph nodes Endocrine ROS: negative for - galactorrhea, hair pattern changes, polydipsia/polyuria or temperature intolerance Respiratory ROS: negative for - cough, hemoptysis, shortness of breath or wheezing Cardiovascular ROS: negative for - chest pain, dyspnea on exertion, edema or irregular heartbeat Gastrointestinal ROS: negative for - abdominal pain, diarrhea, hematemesis, nausea/vomiting or stool incontinence Genito-Urinary ROS:  urinary incontinence  Musculoskeletal ROS: negative for - joint swelling or muscular weakness Neurological ROS: as noted in HPI Dermatological ROS: negative for rash and skin lesion changes  Physical Examination: Blood pressure 116/75, pulse 92, temperature 97.9 F (36.6 C), temperature source Oral, resp. rate 18, height 5\' 5"  (1.651 m), weight 77.5 kg (170 lb 14.4 oz), last menstrual period 12/03/2015, SpO2 100 %.  HEENT-  Normocephalic, no lesions, without obvious abnormality.  Normal external eye and conjunctiva.  Normal TM's bilaterally.  Normal auditory canals and external ears. Normal external nose, mucus membranes and septum.  Normal pharynx. Cardiovascular- S1, S2 normal, pulses palpable throughout   Lungs- chest clear, no wheezing, rales, normal symmetric air entry Abdomen- soft, non-tender; bowel  sounds normal; no masses,  no organomegaly Extremities- no edema Lymph-no adenopathy palpable Musculoskeletal-no joint tenderness, deformity or swelling Skin-warm and dry, no hyperpigmentation, vitiligo, or suspicious lesions  Neurological Examination Mental Status: Alert, oriented, thought content appropriate.  Speech fluent without evidence of aphasia.  Able to follow 3 step commands without difficulty. Cranial Nerves: II: Discs flat bilaterally; Visual fields grossly normal, pupils equal, round, reactive to light and accommodation III,IV, VI: ptosis not present, extra-ocular motions intact bilaterally V,VII: smile symmetric, facial light touch sensation normal bilaterally VIII: hearing normal bilaterally IX,X: gag reflex present XI: bilateral shoulder shrug XII: midline tongue extension Motor: Right : Upper extremity   5/5    Left:     Upper extremity   5/5  Lower extremity   4/5     Lower extremity   3-/5 Sensory: Pinprick and light touch intact throughout, bilaterally Deep Tendon Reflexes: 3+ and symmetric with sustained clonus bilaterally Plantars: Right: mute   Left:  upgoing Cerebellar: Tremor with finger-to-nose testing  Normal heel-to-shin testing bilaterally Gait: not tested due to safety concerns    Laboratory Studies:   Basic Metabolic Panel:  Recent Labs Lab 12/21/15 2244 12/22/15 0417  NA 142 140  K 3.4* 3.9  CL 105 107  CO2 31 26  GLUCOSE 97 138*  BUN 16 15  CREATININE 1.01* 0.98  CALCIUM 8.9 8.6*  MG 2.2  --   PHOS 2.6  --     Liver Function Tests:  Recent Labs Lab 12/21/15 2244  AST 17  ALT 11*  ALKPHOS 58  BILITOT 0.4  PROT 6.9  ALBUMIN 3.7   No results for input(s): LIPASE, AMYLASE in the last 168 hours. No results for input(s): AMMONIA in the last 168 hours.  CBC:  Recent Labs Lab 12/21/15 2244 12/22/15 0417  WBC 4.9 5.2  NEUTROABS 4.1  --   HGB 12.8 12.5  HCT 37.0 35.3  MCV 98.6 99.1  PLT 252 228    Cardiac Enzymes: No results for input(s): CKTOTAL, CKMB, CKMBINDEX, TROPONINI in the last 168 hours.  BNP: Invalid input(s): POCBNP  CBG: No results for input(s): GLUCAP in the last 168 hours.  Microbiology: Results for orders placed or performed during the hospital encounter of 12/21/15  MRSA PCR Screening     Status: None   Collection Time: 12/22/15  4:39 AM  Result Value Ref Range Status   MRSA by PCR NEGATIVE NEGATIVE Final    Comment:        The GeneXpert MRSA Assay (FDA approved for NASAL specimens only), is one component of a comprehensive MRSA colonization surveillance program. It is not intended to diagnose MRSA infection nor to guide or monitor treatment for MRSA infections.     Coagulation Studies: No results for input(s): LABPROT, INR in the last 72 hours.  Urinalysis:  Recent Labs Lab 12/21/15 2357  COLORURINE YELLOW*  LABSPEC 1.023  PHURINE 5.0  GLUCOSEU NEGATIVE  HGBUR NEGATIVE  BILIRUBINUR NEGATIVE  KETONESUR NEGATIVE  PROTEINUR NEGATIVE  NITRITE NEGATIVE  LEUKOCYTESUR NEGATIVE    Lipid Panel:  No results found for: CHOL, TRIG, HDL, CHOLHDL, VLDL,  LDLCALC  HgbA1C:  Lab Results  Component Value Date   HGBA1C 4.7 11/05/2012    Urine Drug Screen:  No results found for: LABOPIA, COCAINSCRNUR, LABBENZ, AMPHETMU, THCU, LABBARB  Alcohol Level: No results for input(s): ETH in the last 168 hours.   Imaging: No results found.   Assessment/Plan: 35 year old female with a history of MS and  presenting with worsening gait.  Patient followed by Dr. Felecia Shelling.  Last IV steroid infusion was in April of this year per patient.  MRI at that time consistent with MS both chronic and acute findings.    Recommendations: 1.  Solumedrol 1000mg  IV daily for 3 days.  Will re-evaluate at that time to determine need for 5 days worth of Solumedrol. 2.  GI prophylaxis 3.  PT/OT consults  Alexis Goodell, MD Neurology [phone] 12/22/2015, 2:05 PM

## 2015-12-22 NOTE — Telephone Encounter (Signed)
Pt called to advise she is in the hospital @ [facility] with relapse from [place]. She said RN wanted to know dalfampridine 10 MG TB12 exactly what it is. She can be reached at [phone]

## 2015-12-22 NOTE — Care Management Note (Signed)
Case Management Note  Patient Details  Name: ELYSE PREVO MRN: [id number] Date of Birth: [birth year]  Subjective/Objective:                  Met with patient and her husband to discuss discharge planning. Patient declined home health PT but agrees to going to outpatient PT. She states she had a walker in 2015 and used [facility] home [facility] after hip surgery. She does not know where walker is located now. She states she did not use one today when working with PT. I highly doubt her insurance will pay for another one. Her PCP is Dr. Annitta Needs. Patient denies difficulty obtaining her medications. She states her husband and adult son are very supportive.   Action/Plan:   No further RNCM needs.   Expected Discharge Date:  12/24/15               Expected Discharge Plan:     In-House Referral:     Discharge planning Services  CM Consult  Post Acute Care Choice:  Home Health Choice offered to:  Patient, Spouse  DME Arranged:    DME Agency:     HH Arranged:    [facility] Agency:     Status of Service:  Completed, signed off  If discussed at [employer] of Stay Meetings, dates discussed:    Additional Comments:  Marshell Garfinkel, RN 12/22/2015, 12:03 PM

## 2015-12-23 ENCOUNTER — Telehealth: Payer: Self-pay | Admitting: Neurology

## 2015-12-23 LAB — GLUCOSE, CAPILLARY
GLUCOSE-CAPILLARY: 131 mg/dL — AB (ref 65–99)
Glucose-Capillary: 125 mg/dL — ABNORMAL HIGH (ref 65–99)

## 2015-12-23 MED ORDER — INSULIN ASPART 100 UNIT/ML ~~LOC~~ SOLN: 0.0000 [IU] | Freq: Three times a day (TID) | SUBCUTANEOUS | Status: DC

## 2015-12-23 MED ORDER — METHYLPREDNISOLONE SODIUM SUCC 1000 MG IJ SOLR
1000.0000 mg | Freq: Every day | INTRAMUSCULAR | Status: DC
  Administered 2015-12-23: 1000 mg via INTRAVENOUS
  Filled 2015-12-23: qty 8

## 2015-12-23 NOTE — Telephone Encounter (Signed)
Pt called to advise she is at home but she is having to strain to go urinate and when she wipes it is still wet on her under clothes. She said the "medication she is taking that men take for prostate issues is not working".  Also she will delete messages in VM.

## 2015-12-23 NOTE — Progress Notes (Signed)
Subjective: Patient improved in strength.  Was able to ambulate with PT on yesterday.  S/p 2/3 of Solumedrol.    Objective: Current vital signs: BP (!) 154/102   Pulse 83   Temp 98.5 F (36.9 C) (Oral)   Resp 18   Ht 5\' 5"  (1.651 m)   Wt 77.5 kg (170 lb 14.4 oz)   LMP 12/03/2015   SpO2 96%   BMI 28.44 kg/m  Vital signs in last 24 hours: Temp:  [98.5 F (36.9 C)-98.6 F (37 C)] 98.5 F (36.9 C) (10/18 0836) Pulse Rate:  [83-109] 83 (10/18 0836) Resp:  [18] 18 (10/18 0836) BP: (131-154)/(87-102) 154/102 (10/18 0836) SpO2:  [96 %-100 %] 96 % (10/18 0836)  Intake/Output from previous day: 10/17 0701 - 10/18 0700 In: 480 [P.O.:480] Out: 125 [Urine:125] Intake/Output this shift: No intake/output data recorded. Nutritional status: Diet regular Room service appropriate? Yes; Fluid consistency: Thin Diet - low sodium heart healthy  Neurologic Exam: Mental Status: Alert, oriented, thought content appropriate.  Speech fluent without evidence of aphasia.  Able to follow 3 step commands without difficulty. Cranial Nerves: II: Discs flat bilaterally; Visual fields grossly normal, pupils equal, round, reactive to light and accommodation III,IV, VI: ptosis not present, extra-ocular motions intact bilaterally V,VII: smile symmetric, facial light touch sensation normal bilaterally VIII: hearing normal bilaterally IX,X: gag reflex present XI: bilateral shoulder shrug XII: midline tongue extension Motor: Right :  Upper extremity   5/5                                      Left:     Upper extremity   5/5             Lower extremity   4/5                                                  Lower extremity   4+/5 Sensory: Pinprick and light touch intact throughout, bilaterally Deep Tendon Reflexes: 3+ and symmetric with sustained clonus bilaterally   Lab Results: Basic Metabolic Panel:  Recent Labs Lab 12/21/15 2244 12/22/15 0417  NA 142 140  K 3.4* 3.9  CL 105 107  CO2 31 26   GLUCOSE 97 138*  BUN 16 15  CREATININE 1.01* 0.98  CALCIUM 8.9 8.6*  MG 2.2  --   PHOS 2.6  --     Liver Function Tests:  Recent Labs Lab 12/21/15 2244  AST 17  ALT 11*  ALKPHOS 58  BILITOT 0.4  PROT 6.9  ALBUMIN 3.7   No results for input(s): LIPASE, AMYLASE in the last 168 hours. No results for input(s): AMMONIA in the last 168 hours.  CBC:  Recent Labs Lab 12/21/15 2244 12/22/15 0417  WBC 4.9 5.2  NEUTROABS 4.1  --   HGB 12.8 12.5  HCT 37.0 35.3  MCV 98.6 99.1  PLT 252 228    Cardiac Enzymes: No results for input(s): CKTOTAL, CKMB, CKMBINDEX, TROPONINI in the last 168 hours.  Lipid Panel: No results for input(s): CHOL, TRIG, HDL, CHOLHDL, VLDL, LDLCALC in the last 168 hours.  CBG:  Recent Labs Lab 12/23/15 0752 12/23/15 1153  GLUCAP 125* 131*    Microbiology: Results for orders placed or performed during the hospital encounter of  12/21/15  MRSA PCR Screening     Status: None   Collection Time: 12/22/15  4:39 AM  Result Value Ref Range Status   MRSA by PCR NEGATIVE NEGATIVE Final    Comment:        The GeneXpert MRSA Assay (FDA approved for NASAL specimens only), is one component of a comprehensive MRSA colonization surveillance program. It is not intended to diagnose MRSA infection nor to guide or monitor treatment for MRSA infections.     Coagulation Studies: No results for input(s): LABPROT, INR in the last 72 hours.  Imaging: No results found.  Medications:  I have reviewed the patient's current medications. Scheduled: . citalopram  40 mg Oral Daily  . dalfampridine  10 mg Oral BID  . enoxaparin (LOVENOX) injection  40 mg Subcutaneous Q24H  . gabapentin  800 mg Oral TID  . insulin aspart  0-9 Units Subcutaneous TID WC  . lamoTRIgine  200 mg Oral BID  . lisinopril  20 mg Oral Daily  . methylPREDNISolone (SOLU-MEDROL) injection  1,000 mg Intravenous Daily  . metroNIDAZOLE  500 mg Oral BID  . mupirocin ointment  1  application Topical TID  . Norgestimate-Ethinyl Estradiol Triphasic  1 tablet Oral Daily  . Oxcarbazepine  300 mg Oral BID  . tamsulosin  0.4 mg Oral Daily  . tiZANidine  4 mg Oral QHS  . traMADol  75 mg Oral Q6H  . valACYclovir  500 mg Oral BID    Assessment/Plan: Patient improving s/p dose 2/3 of Solumedrol.  Tolerating therapy well.  Wants to go home for funeral tomorrow.  Recommendations: 1.  Last dose of Solumedrol this afternoon.  Patient my be discharged home after that time.  No steroid taper required.    2.  Patient to follow up with Dr. Felecia Shelling after discharge.     LOS: 1 day   Alexis Goodell, MD Neurology [phone] 12/23/2015  2:14 PM

## 2015-12-23 NOTE — Discharge Instructions (Signed)
Follow with your neurologist in 2 weeks. °

## 2015-12-23 NOTE — Discharge Summary (Signed)
[facility] [place] NAME: Dawn Peters    MR#:  [id number]  DATE OF BIRTH:  1980/08/16  DATE OF ADMISSION:  12/21/2015 ADMITTING PHYSICIAN: [name], DO  DATE OF DISCHARGE: 12/23/2015  PRIMARY CARE PHYSICIAN: [name], MD    ADMISSION DIAGNOSIS:  Left leg weakness [R29.898] Multiple sclerosis exacerbation ([name]) [G35]  DISCHARGE DIAGNOSIS:  Active Problems:   Multiple sclerosis exacerbation ([place])   SECONDARY DIAGNOSIS:   Past Medical History:  Diagnosis Date  . Anxiety   . Depression   . Hypertension   . MS (multiple sclerosis) ([name])     HOSPITAL COURSE:   1. Exacerbation of multiple sclerosis-admitted for IV steroids 1 g daily, IV fluids, appreciated neurology consultation, PT evaluation. Continue baclofen, Neurontin, tramadol, Zanaflex, Flomax  pt felt much better after 3 doses IV steroids- d/c home with follow up with neurologist. 2. Hyperkalemia, mild-we'll replace by mouth. 3. History of anxiety and depression-continue Elavil, Celexa, Lamictal, Trileptal 4. History of hypertension-continue lisinopril 5. History of pain-continue hydrocodone, Naprosyn, tramadol  DISCHARGE CONDITIONS:   Stable.  CONSULTS OBTAINED:  Treatment Team:  Alexis Goodell, MD  DRUG ALLERGIES:  No Known Allergies  DISCHARGE MEDICATIONS:   Current Discharge Medication List    CONTINUE these medications which have NOT CHANGED   Details  Alemtuzumab (LEMTRADA) 12 MG/1.2ML SOLN Inject 1 Dose into the vein as directed.    amitriptyline (ELAVIL) 25 MG tablet Take one or two at bedtime Qty: 60 tablet, Refills: 11    baclofen (LIORESAL) 20 MG tablet Take 1 tablet (20 mg total) by mouth 3 (three) times daily as needed (for ms). Qty: 90 each, Refills: 11    cetirizine (ZYRTEC) 10 MG tablet Take 10 mg by mouth daily.  Refills: 0    citalopram (CELEXA) 40 MG tablet Take one tablet by mouth daily. Qty: 90 tablet,  Refills: 3    gabapentin (NEURONTIN) 800 MG tablet Take 1 tablet (800 mg total) by mouth 3 (three) times daily. Qty: 90 tablet, Refills: 11    lamoTRIgine (LAMICTAL) 200 MG tablet Take 200 mg by mouth 2 (two) times daily. Refills: 11    lisinopril (PRINIVIL,ZESTRIL) 20 MG tablet Take 1 tablet (20 mg total) by mouth daily. Qty: 90 tablet, Refills: 1    metroNIDAZOLE (FLAGYL) 500 MG tablet Take 1 tablet (500 mg total) by mouth 2 (two) times daily. Qty: 14 tablet, Refills: 0   Associated Diagnoses: BV (bacterial vaginosis)    Norgestimate-Ethinyl Estradiol Triphasic 0.18/0.215/0.25 MG-25 MCG tab Take 1 tablet by mouth daily. Qty: 1 Package, Refills: 11   Associated Diagnoses: Well woman exam with routine gynecological exam; Uses oral contraception    Oxcarbazepine (TRILEPTAL) 300 MG tablet Take 1 tablet (300 mg total) by mouth 2 (two) times daily. Qty: 60 tablet, Refills: 1    SUMAtriptan (IMITREX) 100 MG tablet Take 1 tablet (100 mg total) by mouth once as needed for migraine. May repeat in 2 hours if headache persists or recurs. Qty: 10 tablet, Refills: 0    tamsulosin (FLOMAX) 0.4 MG CAPS capsule Take 0.4 mg by mouth daily. Refills: 11    tiZANidine (ZANAFLEX) 4 MG tablet Take 1 tablet (4 mg total) by mouth at bedtime. Qty: 30 tablet, Refills: 11    traMADol (ULTRAM-ER) 300 MG 24 hr tablet Take 1 tablet (300 mg total) by mouth daily. Qty: 30 tablet, Refills: 3    valACYclovir (VALTREX) 500 MG tablet Take 1 tablet (500 mg total)  by mouth 2 (two) times daily. Qty: 60 tablet, Refills: 2    dalfampridine 10 MG TB12 Take 10 mg by mouth 2 (two) times daily.    HYDROcodone-acetaminophen (NORCO/VICODIN) 5-325 MG tablet Take 1 tablet by mouth every 8 (eight) hours as needed. Refills: 0    mupirocin ointment (BACTROBAN) 2 % Apply 1 application topically 3 (three) times daily as needed. Refills: 0    naproxen (NAPROSYN) 500 MG tablet Take 500 mg by mouth 2 (two) times daily as  needed. Refills: 2    predniSONE (DELTASONE) 5 MG tablet Begin taking 6 tablets daily, taper by one tablet daily until off the medication. Qty: 21 tablet, Refills: 0         DISCHARGE INSTRUCTIONS:    Follow with neurology clinci in 2 weeks.  If you experience worsening of your admission symptoms, develop shortness of breath, life threatening emergency, suicidal or homicidal thoughts you must seek medical attention immediately by calling 911 or calling your MD immediately  if symptoms less severe.  You Must read complete instructions/literature along with all the possible adverse reactions/side effects for all the Medicines you take and that have been prescribed to you. Take any new Medicines after you have completely understood and accept all the possible adverse reactions/side effects.   Please note  You were cared for by a hospitalist during your hospital stay. If you have any questions about your discharge medications or the care you received while you were in the hospital after you are discharged, you can call the unit and asked to speak with the hospitalist on call if the hospitalist that took care of you is not available. Once you are discharged, your primary care physician will handle any further medical issues. Please note that NO REFILLS for any discharge medications will be authorized once you are discharged, as it is imperative that you return to your primary care physician (or establish a relationship with a primary care physician if you do not have one) for your aftercare needs so that they can reassess your need for medications and monitor your lab values.    Today   CHIEF COMPLAINT:   Chief Complaint  Patient presents with  . Multiple Sclerosis  . Weakness    HISTORY OF PRESENT ILLNESS:  Dawn Peters  is a 35 y.o. female with a known history of anxiety, depression, hypertension, multiple sclerosis presents to the emergency department complaining of weakness.   Patient was in a usual state of health until about 5 days ago when she experienced gradually worsening lower extremity weakness left greater than right. She was started on a prednisone taper for an exacerbation of MS symptoms however her symptoms have not improved. She is now having difficulty with ambulation and activities of daily living..  Otherwise there has been no change in status. Patient has been taking medication as prescribed and there has been no recent change in medication or diet.  There has been no recent illness, travel or sick contacts.    Patient denies fevers/chills, weakness, dizziness, chest pain, shortness of breath, N/V/C/D, abdominal pain, dysuria/frequency, changes in mental status.     VITAL SIGNS:  Blood pressure (!) 154/102, pulse 83, temperature 98.5 F (36.9 C), temperature source Oral, resp. rate 18, height 5\' 5"  (1.651 m), weight 77.5 kg (170 lb 14.4 oz), last menstrual period 12/03/2015, SpO2 96 %.  I/O:   Intake/Output Summary (Last 24 hours) at 12/23/15 1357 Last data filed at 12/22/15 1800  Gross per 24 hour  Intake              240 ml  Output                0 ml  Net              240 ml    PHYSICAL EXAMINATION:  GENERAL:  35 y.o.-year-old patient lying in the bed with no acute distress.  EYES: Pupils equal, round, reactive to light and accommodation. No scleral icterus. Extraocular muscles intact.  HEENT: Head atraumatic, normocephalic. Oropharynx and nasopharynx clear.  NECK:  Supple, no jugular venous distention. No thyroid enlargement, no tenderness.  LUNGS: Normal breath sounds bilaterally, no wheezing, rales,rhonchi or crepitation. No use of accessory muscles of respiration.  CARDIOVASCULAR: S1, S2 normal. No murmurs, rubs, or gallops.  ABDOMEN: Soft, non-tender, non-distended. Bowel sounds present. No organomegaly or mass.  EXTREMITIES: No pedal edema, cyanosis, or clubbing.  NEUROLOGIC: Cranial nerves II through XII are intact. Muscle  strength 5/5 in all extremities. Sensation intact. Gait not checked.  PSYCHIATRIC: The patient is alert and oriented x 3.  SKIN: No obvious rash, lesion, or ulcer.   DATA REVIEW:   CBC  Recent Labs Lab 12/22/15 0417  WBC 5.2  HGB 12.5  HCT 35.3  PLT 228    Chemistries   Recent Labs Lab 12/21/15 2244 12/22/15 0417  NA 142 140  K 3.4* 3.9  CL 105 107  CO2 31 26  GLUCOSE 97 138*  BUN 16 15  CREATININE 1.01* 0.98  CALCIUM 8.9 8.6*  MG 2.2  --   AST 17  --   ALT 11*  --   ALKPHOS 58  --   BILITOT 0.4  --     Cardiac Enzymes No results for input(s): TROPONINI in the last 168 hours.  Microbiology Results  Results for orders placed or performed during the hospital encounter of 12/21/15  MRSA PCR Screening     Status: None   Collection Time: 12/22/15  4:39 AM  Result Value Ref Range Status   MRSA by PCR NEGATIVE NEGATIVE Final    Comment:        The GeneXpert MRSA Assay (FDA approved for NASAL specimens only), is one component of a comprehensive MRSA colonization surveillance program. It is not intended to diagnose MRSA infection nor to guide or monitor treatment for MRSA infections.     RADIOLOGY:  No results found.  EKG:   Orders placed or performed during the hospital encounter of 11/18/15  . ED EKG  . ED EKG  . EKG 12-Lead  . EKG 12-Lead  . EKG      Management plans discussed with the patient, family and they are in agreement.  CODE STATUS:     Code Status Orders        Start     Ordered   12/22/15 0106  Full code  Continuous     12/22/15 0105    Code Status History    Date Active Date Inactive Code Status Order ID Comments User Context   07/17/2015  9:56 AM 07/19/2015  1:40 PM Full Code [id number]  Kelvin Cellar, MD Inpatient      TOTAL TIME TAKING CARE OF THIS PATIENT: 35 minutes.    Vaughan Basta M.D on 12/23/2015 at 1:57 PM  Between 7am to 6pm - Pager - [phone]  After 6pm go to [email or web address] - password  [facility]  Office  [phone]  CC: Primary care physician;  [name], MD   Note: This dictation was prepared with Dragon dictation along with smaller phrase technology. Any transcriptional errors that result from this process are unintentional.

## 2015-12-23 NOTE — Telephone Encounter (Signed)
I have spoken with Dawn Peters this morning and given hospital f/u with RAS to discuss ongoing problems/ med changes/fim

## 2015-12-29 ENCOUNTER — Other Ambulatory Visit: Payer: Self-pay | Admitting: Neurology

## 2015-12-29 ENCOUNTER — Telehealth: Payer: Self-pay | Admitting: Neurology

## 2015-12-29 NOTE — Telephone Encounter (Signed)
Pt called in asking if she can get a steroid because of her legs. She says it feels heavy and is dragging it. Pt was in the hospital for 3 days and was d/c'd last Thursday. Please call and advise

## 2015-12-29 NOTE — Telephone Encounter (Signed)
I have spoken with Dawn Peters this afternoon.  RAS is ooo this week.  Appt. given with Dawn Peters for Thursday/fim

## 2015-12-30 ENCOUNTER — Telehealth: Payer: Self-pay | Admitting: *Deleted

## 2015-12-30 DIAGNOSIS — N760 Acute vaginitis: Principal | ICD-10-CM

## 2015-12-30 DIAGNOSIS — B9689 Other specified bacterial agents as the cause of diseases classified elsewhere: Secondary | ICD-10-CM

## 2015-12-30 NOTE — Telephone Encounter (Signed)
Pt left message stating that she had called last week and requested Rx for BV. The prescription is not at her pharmacy. Per chart review, pt spoke w/Carrie on 10/16 for this problem and Rx was e-prescribed the same Micael Barb. Receipt confirmed by pharmacy @ 1542 that Trey[facility] Spill[facility]. Pt needs to contact her pharmacy and tell them the Rx was sent on 10/16. I called pt and could not leave a message due to her mailbox being full.

## 2015-12-31 ENCOUNTER — Ambulatory Visit: Payer: Self-pay | Admitting: Adult Health

## 2015-12-31 ENCOUNTER — Telehealth: Payer: Self-pay | Admitting: Adult Health

## 2015-12-31 NOTE — Telephone Encounter (Signed)
Pt. noshowed her appt. with Megan today/fim

## 2015-12-31 NOTE — Telephone Encounter (Signed)
I have spoken with Lynnann this am.  Unfortunately, both Hoyle Sauer and Jinny Blossom have full schedules now.  Dr. Jannifer Franklin is the w/i doc and  his sched. is full as well.  She sts. this is ok, as she would rather see RAS.  She has an appt. with him on Monday and will keep it/fim

## 2015-12-31 NOTE — Telephone Encounter (Signed)
Patient called 8:53:41 to advise she overslept and missed 8:30 appointment this morning with NP, Megan, requests appointment to be seen today if possible, (Megan's next availability is Monday October 30th, patient has appointment w/Dr. Felecia Shelling October 30th 1:30). Please call to advise.

## 2015-12-31 NOTE — Telephone Encounter (Signed)
Spoke with patient concerning rx sent to the pharmacy. Patient will check with her pharmacy again to to make sure they have her rx.

## 2016-01-01 ENCOUNTER — Encounter: Payer: Self-pay | Admitting: Adult Health

## 2016-01-04 ENCOUNTER — Encounter: Payer: Self-pay | Admitting: Neurology

## 2016-01-04 ENCOUNTER — Ambulatory Visit (INDEPENDENT_AMBULATORY_CARE_PROVIDER_SITE_OTHER): Payer: Medicare Other | Admitting: Neurology

## 2016-01-04 VITALS — BP 118/82 | HR 78 | Resp 18 | Ht 65.0 in | Wt 171.0 lb

## 2016-01-04 DIAGNOSIS — R269 Unspecified abnormalities of gait and mobility: Secondary | ICD-10-CM

## 2016-01-04 DIAGNOSIS — R5383 Other fatigue: Secondary | ICD-10-CM | POA: Diagnosis not present

## 2016-01-04 DIAGNOSIS — G35 Multiple sclerosis: Secondary | ICD-10-CM | POA: Diagnosis not present

## 2016-01-04 DIAGNOSIS — Z79899 Other long term (current) drug therapy: Secondary | ICD-10-CM | POA: Diagnosis not present

## 2016-01-04 DIAGNOSIS — N399 Disorder of urinary system, unspecified: Secondary | ICD-10-CM | POA: Diagnosis not present

## 2016-01-04 DIAGNOSIS — M21372 Foot drop, left foot: Secondary | ICD-10-CM

## 2016-01-04 MED ORDER — NAPROXEN 500 MG PO TABS: 500.0000 mg | ORAL_TABLET | Freq: Two times a day (BID) | ORAL | 3 refills | Status: DC | PRN

## 2016-01-04 NOTE — Progress Notes (Signed)
[facility]  PATIENT: Dawn Peters DOB: Jul 07, 1987  REFERRING DOCTOR OR PCP:  Dr. Annitta Needs SOURCE: Patient, records in EMR, lab results and radiology reports in EMR, MRI images reviewed on PACS.  _________________________________   HISTORICAL  CHIEF COMPLAINT:  Chief Complaint  Patient presents with  . Multiple Sclerosis    Sts. she continues to have difficulty walking, more so with certain types of shoes. More falls./fim    HISTORY OF PRESENT ILLNESS:   Dawn Peters is a 35 year old woman who was diagnosed with MS in 2000.      MS:   Earlier this year, she switched from [place] to [place] due to breakthrough. She had her first course of treatment at [facility]. She tolerated the infusions well.   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  Her main problems are left weakness and dysesthesias .  She denies any exacerbations since her last visit.  Gait/strength/sensation:    She feels her left leg drags, especially if she is tired.   She stumbles some but no major falls.  She notes her left foot drops more when she is tired or walks longer distances   She started Ampyra and is tolerating it well.   She feels it helped some   She does not note any significant symptoms in the arms.  She is currently on gabapentin 800 mg by mouth 4 times a day and lamotrigine 200 mg twice a day for the dysesthetic pain.  Lamotrigine only helped a little bit.    For spasticity she is on baclofen and tizanidine.   She is also on tramadol ER 300 mg for pain.     Vision:    She denies any MS related visual problems.  Bladder:   She had urinary frequency and urgency not helped by oxybutynin or vesicare.  At the last visit, tamsulosin was started for her hesitancy with some benefit..    Bowel is fine..  Fatigue/sleep:   She denies any major difficulty with fatigue. She sleeps well most nights.  Mood:  She reports mild depression and anxiety  and received some benefit from Celexa 20 mg by mouth daily.   Buspar helps anxiety some.    Anxiety is more of a problem than depression  Cognition:   She denies any difficulties with her cognitive abilities.  Chronic pain:   She reports chronic left leg pain, back pain and shoulder pain is better on her med's.        Besides gabapentin and lamotrigine, she also takes diclofenac and tramadol.    In the past, she was once on oxycontin.     MS history:   She was diagnosed with MS in 2000 after presenting with gait difficulties, numbness and headaches. An MRI of the brain was consistent with MS. She was then started on Betaseron. She had difficulty tolerating Betaseron and at some point switched to Novantrone. She took Novantrone every 3 months for a year or so. A little later, she was placed on Tysabri and she stayed on it for a couple of years. However, she was JCV-positive and switched off. She did well on Tysabri. She had been on Tecfidera since 2015. Her MRI from June 2015 showed that there had been no changes compared to an MRI from 2014.   However, she had an exacerbation March 2017.   The 08/07/2013 MRI of the brain shows multiple T2/FLAIR hyperintense foci located in  the cerebellum, middle cerebellar peduncles, pons and in the periventricular white matter of the hemispheres in a pattern and configuration consistent with chronic demyelinating plaque associated with MS. There were no acute findings. There was no change compared to the previous MRI from 01/16/2013 that was also reviewed. An MRI of the cervical spine shows subtle T2 hyperintense signal within the cord and there were no acute findings.  REVIEW OF SYSTEMS: Constitutional: No fevers, chills, sweats, or change in appetite.   Notes fatigue, some insomnia Eyes: No visual changes, double vision, eye pain Ear, nose and throat: No hearing loss, ear pain, nasal congestion, sore throat Cardiovascular: No chest pain, palpitations Respiratory: No  shortness of breath at rest or with exertion.   No wheezes GastrointestinaI: No nausea, vomiting, diarrhea, abdominal pain, fecal incontinence Genitourinary: No dysuria.   Flomax has helped urinary hesitancy. 1 x nocturia. Musculoskeletal: as above Integumentary: No rash, pruritus, skin lesions Neurological: as above Psychiatric: Mild depression at this time.  Mild anxiety Endocrine: No palpitations, diaphoresis, change in appetite, change in weigh or increased thirst Hematologic/Lymphatic: No anemia, purpura, petechiae. Allergic/Immunologic: No itchy/runny eyes, nasal congestion, recent allergic reactions, rashes  ALLERGIES: No Known Allergies  HOME MEDICATIONS:  Current Outpatient Prescriptions:  .  Alemtuzumab (LEMTRADA) 12 MG/1.2ML SOLN, Inject 1 Dose into the vein as directed., Disp: , Rfl:  .  amitriptyline (ELAVIL) 25 MG tablet, Take one or two at bedtime (Patient taking differently: Take 25 mg by mouth at bedtime as needed for sleep. ), Disp: 60 tablet, Rfl: 11 .  baclofen (LIORESAL) 20 MG tablet, Take 1 tablet (20 mg total) by mouth 3 (three) times daily as needed (for ms)., Disp: 90 each, Rfl: 11 .  cetirizine (ZYRTEC) 10 MG tablet, Take 10 mg by mouth daily. , Disp: , Rfl: 0 .  citalopram (CELEXA) 40 MG tablet, Take one tablet by mouth daily. (Patient taking differently: Take 40 mg by mouth daily. ), Disp: 90 tablet, Rfl: 3 .  dalfampridine 10 MG TB12, Take 10 mg by mouth 2 (two) times daily., Disp: , Rfl:  .  gabapentin (NEURONTIN) 800 MG tablet, Take 1 tablet (800 mg total) by mouth 3 (three) times daily., Disp: 90 tablet, Rfl: 11 .  HYDROcodone-acetaminophen (NORCO/VICODIN) 5-325 MG tablet, Take 1 tablet by mouth every 8 (eight) hours as needed., Disp: , Rfl: 0 .  lamoTRIgine (LAMICTAL) 200 MG tablet, Take 200 mg by mouth 2 (two) times daily., Disp: , Rfl: 11 .  lisinopril (PRINIVIL,ZESTRIL) 20 MG tablet, Take 1 tablet (20 mg total) by mouth daily., Disp: 90 tablet, Rfl:  1 .  metroNIDAZOLE (FLAGYL) 500 MG tablet, Take 1 tablet (500 mg total) by mouth 2 (two) times daily., Disp: 14 tablet, Rfl: 0 .  mupirocin ointment (BACTROBAN) 2 %, Apply 1 application topically 3 (three) times daily as needed., Disp: , Rfl: 0 .  naproxen (NAPROSYN) 500 MG tablet, Take 1 tablet (500 mg total) by mouth 2 (two) times daily as needed., Disp: 60 tablet, Rfl: 3 .  Norgestimate-Ethinyl Estradiol Triphasic 0.18/0.215/0.25 MG-25 MCG tab, Take 1 tablet by mouth daily., Disp: 1 Package, Rfl: 11 .  Oxcarbazepine (TRILEPTAL) 300 MG tablet, Take 1 tablet (300 mg total) by mouth 2 (two) times daily., Disp: 60 tablet, Rfl: 1 .  predniSONE (DELTASONE) 5 MG tablet, Begin taking 6 tablets daily, taper by one tablet daily until off the medication., Disp: 21 tablet, Rfl: 0 .  SUMAtriptan (IMITREX) 100 MG tablet, Take 1 tablet (100 mg total) by  mouth once as needed for migraine. May repeat in 2 hours if headache persists or recurs., Disp: 10 tablet, Rfl: 0 .  tamsulosin (FLOMAX) 0.4 MG CAPS capsule, Take 0.4 mg by mouth daily., Disp: , Rfl: 11 .  tiZANidine (ZANAFLEX) 4 MG tablet, Take 1 tablet (4 mg total) by mouth at bedtime., Disp: 30 tablet, Rfl: 11 .  traMADol (ULTRAM-ER) 300 MG 24 hr tablet, Take 1 tablet (300 mg total) by mouth daily., Disp: 30 tablet, Rfl: 3 .  valACYclovir (VALTREX) 500 MG tablet, Take 1 tablet (500 mg total) by mouth 2 (two) times daily., Disp: 60 tablet, Rfl: 2  PAST MEDICAL HISTORY: Past Medical History:  Diagnosis Date  . Anxiety   . Depression   . Hypertension   . MS (multiple sclerosis) ([place])     PAST SURGICAL HISTORY: Past Surgical History:  Procedure Laterality Date  . BILATERAL HIP ARTHROSCOPY Left 07/2013  . JOINT REPLACEMENT Bilateral     R 2004 and L 2005    FAMILY HISTORY: Family History  Problem Relation Age of Onset  . Healthy Mother   . Healthy Father   . Breast cancer      paternal grandmother dx age 47  . Colon cancer      paternal  grandmother  . Hypertension Other   . Diabetes Other     SOCIAL HISTORY:  Social History   Social History  . Marital status: Single    Spouse name: N/A  . Number of children: N/A  . Years of education: N/A   Occupational History  . Not on file.   Social History Main Topics  . Smoking status: Never Smoker  . Smokeless tobacco: Never Used  . Alcohol use No  . Drug use: No  . Sexual activity: Not Currently    Birth control/ protection: Pill   Other Topics Concern  . Not on file   Social History Narrative  . No narrative on file     PHYSICAL EXAM  Vitals:   01/04/16 1341  BP: 118/82  Pulse: 78  Resp: 18  Weight: 171 lb (77.6 kg)  Height: 5\' 5"  (1.651 m)    Body mass index is 28.46 kg/m.   General: The patient is well-developed and well-nourished and in no acute distress.  Musculoskeletal:   She has good neck ROM.     Neurologic Exam  Mental status: The patient is lethargic but oriented x 3 at the time of the examination. The patient has apparent normal recent and remote memory, with mildly reduced attention span and concentration ability.   Speech is normal.  Cranial nerves: Extraocular movements are full.  There is good facial sensation to soft touch bilaterally.Facial strength is normal.  Trapezius and sternocleidomastoid strength is normal. No dysarthria is noted.  The tongue is midline, and the patient has symmetric elevation of the soft palate. No obvious hearing deficits are noted.  Motor:  Muscle bulk is normal.   Tone is increased on legs, left > right. Strength is  5 / 5 in all 4 extremities.   Sensory: Sensory testing is currently intact to pinprick, soft touch and vibration sensation in all 4 extremities.  Coordination: Cerebellar testing reveals good finger-nose-finger   Gait and station: Station is now near normal.     Gait is wide, tandem is poor.   She has a left foot drop.    Reflexes: Deep tendon reflexes are increased in legs, left >  right and there is spread at the  knees (worse on left) and  nonsustained clonus at the left ankles.         DIAGNOSTIC DATA (LABS, IMAGING, TESTING) - I reviewed patient records, labs, notes, testing and imaging myself where available.  Lab Results  Component Value Date   WBC 5.2 12/22/2015   HGB 12.5 12/22/2015   HCT 35.3 12/22/2015   MCV 99.1 12/22/2015   PLT 228 12/22/2015      Component Value Date/Time   NA 140 12/22/2015 0417   NA 142 10/30/2015 1204   K 3.9 12/22/2015 0417   CL 107 12/22/2015 0417   CO2 26 12/22/2015 0417   GLUCOSE 138 (H) 12/22/2015 0417   BUN 15 12/22/2015 0417   BUN 15 10/30/2015 1204   CREATININE 0.98 12/22/2015 0417   CREATININE 0.85 07/03/2014 0959   CALCIUM 8.6 (L) 12/22/2015 0417   PROT 6.9 12/21/2015 2244   PROT 7.2 06/03/2015 1635   ALBUMIN 3.7 12/21/2015 2244   ALBUMIN 4.4 06/03/2015 1635   AST 17 12/21/2015 2244   ALT 11 (L) 12/21/2015 2244   ALKPHOS 58 12/21/2015 2244   BILITOT 0.4 12/21/2015 2244   BILITOT 0.3 06/03/2015 1635   GFRNONAA >60 12/22/2015 0417   GFRNONAA 88 07/03/2014 0959   GFRAA >60 12/22/2015 0417   GFRAA >89 07/03/2014 0959   No results found for: CHOL, HDL, LDLCALC, LDLDIRECT, TRIG, CHOLHDL Lab Results  Component Value Date   HGBA1C 4.7 11/05/2012   No results found for: VITAMINB12 Lab Results  Component Value Date   TSH 1.710 09/15/2015       ASSESSMENT AND PLAN  MULTIPLE SCLEROSIS - Plan: CBC with Differential/Platelet, Basic metabolic panel, TSH, Urinalysis, Routine w reflex microscopic (not at [facility])  Gait disturbance  Other fatigue  Urinary disorder  High risk medication use - Plan: CBC with Differential/Platelet, Basic metabolic panel, TSH, Urinalysis, Routine w reflex microscopic (not at [facility])  Left foot drop    1.   Check labs (CBC/D, BMP, UA) and make sure she gets into official REMS monitoring program (prefers to have labs at [facility] in [place], rather than home) 2.    Continue  medications for pain and spasticity 3.    Referral to orthotics for left AFO brace for her foot drop.    4.    Return to see me in 4 months or sooner if there are new or worsening neurologic symptoms.  Richard A. Felecia Shelling, MD, PhD [id number], 0000000 PM Certified in Neurology, Clinical Neurophysiology, Sleep Medicine, Pain Medicine and Neuroimaging  [facility] [address], [place], [place] [address] [phone] ]

## 2016-01-05 ENCOUNTER — Telehealth: Payer: Self-pay | Admitting: Neurology

## 2016-01-05 LAB — URINALYSIS, ROUTINE W REFLEX MICROSCOPIC
Bilirubin, UA: NEGATIVE
GLUCOSE, UA: NEGATIVE
Ketones, UA: NEGATIVE
LEUKOCYTES UA: NEGATIVE
Nitrite, UA: NEGATIVE
PROTEIN UA: NEGATIVE
RBC, UA: NEGATIVE
Specific Gravity, UA: 1.009 (ref 1.005–1.030)
UUROB: 0.2 mg/dL (ref 0.2–1.0)
pH, UA: 6.5 (ref 5.0–7.5)

## 2016-01-05 LAB — TSH: TSH: 1.69 u[IU]/mL (ref 0.450–4.500)

## 2016-01-05 LAB — CBC WITH DIFFERENTIAL/PLATELET
BASOS ABS: 0 10*3/uL (ref 0.0–0.2)
Basos: 0 %
EOS (ABSOLUTE): 0 10*3/uL (ref 0.0–0.4)
Eos: 1 %
Hematocrit: 36 % (ref 34.0–46.6)
Hemoglobin: 12.6 g/dL (ref 11.1–15.9)
IMMATURE GRANS (ABS): 0.1 10*3/uL (ref 0.0–0.1)
IMMATURE GRANULOCYTES: 1 %
LYMPHS: 4 %
Lymphocytes Absolute: 0.2 10*3/uL — ABNORMAL LOW (ref 0.7–3.1)
MCH: 33.4 pg — AB (ref 26.6–33.0)
MCHC: 35 g/dL (ref 31.5–35.7)
MCV: 96 fL (ref 79–97)
Monocytes Absolute: 0.8 10*3/uL (ref 0.1–0.9)
Monocytes: 12 %
NEUTROS PCT: 82 %
Neutrophils Absolute: 5.4 10*3/uL (ref 1.4–7.0)
PLATELETS: 280 10*3/uL (ref 150–379)
RBC: 3.77 x10E6/uL (ref 3.77–5.28)
RDW: 14.6 % (ref 12.3–15.4)
WBC: 6.5 10*3/uL (ref 3.4–10.8)

## 2016-01-05 LAB — BASIC METABOLIC PANEL
BUN / CREAT RATIO: 13 (ref 9–23)
BUN: 12 mg/dL (ref 6–20)
CALCIUM: 9.6 mg/dL (ref 8.7–10.2)
CHLORIDE: 102 mmol/L (ref 96–106)
CO2: 27 mmol/L (ref 18–29)
Creatinine, Ser: 0.9 mg/dL (ref 0.57–1.00)
GFR calc Af Amer: 94 mL/min/{1.73_m2} (ref >59)
GFR calc non Af Amer: 82 mL/min/{1.73_m2} (ref >59)
GLUCOSE: 72 mg/dL (ref 65–99)
Potassium: 4.1 mmol/L (ref 3.5–5.2)
Sodium: 145 mmol/L — ABNORMAL HIGH (ref 134–144)

## 2016-01-05 MED ORDER — METRONIDAZOLE 500 MG PO TABS: 500.0000 mg | ORAL_TABLET | Freq: Two times a day (BID) | ORAL | 0 refills | Status: DC

## 2016-01-05 NOTE — Telephone Encounter (Signed)
I have spoken with Dawn Peters this afternoon and let her know Naproxen has been sent to her pharmacy.  She is living in housing where the rent is partially subsidized by the government, and her boyfriend is not on the lease--she requested a note for her landlord so that her boyfriend would be allowed to stay there.  I have explained RAS does not have influence over housing/leasing regulations/fim

## 2016-01-05 NOTE — Telephone Encounter (Addendum)
Pt left new message today @ 1128 stating that her pharmacy has not received the prescription for metronidazole from 10/16. I called the pharmacy and confirmed that they did not receive it even though EPIC shows receipt confirmation. Rx given verbally to pharmacist. Pt was notified and she will obtain medication today.

## 2016-01-05 NOTE — Telephone Encounter (Signed)
Patient is calling to get a note with the list of medications she takes and also stating that the patient needs assistance because she has had so many falls.  The patient would also like a refill for naproxen (NAPROSYN) 500 MG tablet [facility] [address].

## 2016-01-19 ENCOUNTER — Ambulatory Visit: Payer: Medicare Other | Admitting: Obstetrics & Gynecology

## 2016-01-19 ENCOUNTER — Telehealth: Payer: Self-pay | Admitting: Neurology

## 2016-01-19 NOTE — Telephone Encounter (Signed)
Pt called in asking if she could have a rx prednisone for her left leg. She would also like a call about her lab results. Please call and advise [phone]

## 2016-01-19 NOTE — Telephone Encounter (Signed)
Spoke with Dawn Peters this morning and per RAS, explained that further oral steroids are not necessary at this time; are not likely to help.  She verbalized understanding of same.  I have also reviewed results of labs done in our office at last ov with her/fim

## 2016-01-21 ENCOUNTER — Telehealth: Payer: Self-pay | Admitting: Neurology

## 2016-01-21 DIAGNOSIS — G35 Multiple sclerosis: Secondary | ICD-10-CM

## 2016-01-21 DIAGNOSIS — R29898 Other symptoms and signs involving the musculoskeletal system: Secondary | ICD-10-CM

## 2016-01-21 DIAGNOSIS — R269 Unspecified abnormalities of gait and mobility: Secondary | ICD-10-CM

## 2016-01-21 NOTE — Telephone Encounter (Signed)
Pt called in to try and come in for an appt today with Dr.Sater, he does not have any openings. Pt says she is dragging her leg and has fallen today. Pt stated she will go to the ER.

## 2016-01-21 NOTE — Telephone Encounter (Signed)
Dawn Peters. Pt. has had frequent oral and IV steroids.  RAS is concerned with bone health.  He would like to repeat MRI brain and c-spine, with and without contrast, to check for progression of MS/lesion in cervical spinal cord that may be affecting gait.  If there is a new lesion, he may consider a change in MS therapy.  MRI's ordered in EPIC/fim

## 2016-01-21 NOTE — Telephone Encounter (Signed)
Patient called again to request prescription for prednis[facility] to [facility], is having MS exacerbation, keeps falling, has to keep leaning on husband and "I'm too heavy to do that".

## 2016-01-21 NOTE — Telephone Encounter (Signed)
Pt called back said she really don't want to go to ED. She advised she went from walking to dragging her leg but she said she can't walk at all. She is asking for RX for prednisone to be [facility].  Please call asap

## 2016-01-21 NOTE — Telephone Encounter (Signed)
Pt called back, she can be reached today at [phone]

## 2016-01-22 ENCOUNTER — Other Ambulatory Visit: Payer: Self-pay | Admitting: Obstetrics & Gynecology

## 2016-01-22 DIAGNOSIS — Z01419 Encounter for gynecological examination (general) (routine) without abnormal findings: Secondary | ICD-10-CM

## 2016-01-22 DIAGNOSIS — Z3041 Encounter for surveillance of contraceptive pills: Secondary | ICD-10-CM

## 2016-01-22 NOTE — Telephone Encounter (Signed)
VM full/fim 

## 2016-02-01 ENCOUNTER — Ambulatory Visit: Payer: Medicare Other | Admitting: Neurology

## 2016-02-05 ENCOUNTER — Telehealth: Payer: Self-pay | Admitting: Adult Health

## 2016-02-05 NOTE — Telephone Encounter (Signed)
I have spoken with Dawn Peters this morning and explained that she [facility]  In March 2017, rx. with one yr. r/f was sent to them/fim

## 2016-02-05 NOTE — Telephone Encounter (Signed)
Patient is requesting that Baclofen be refilled.

## 2016-02-22 ENCOUNTER — Other Ambulatory Visit: Payer: Self-pay | Admitting: Neurology

## 2016-02-23 ENCOUNTER — Encounter: Payer: Self-pay | Admitting: Neurology

## 2016-03-14 ENCOUNTER — Telehealth: Payer: Self-pay | Admitting: Neurology

## 2016-03-14 NOTE — Telephone Encounter (Signed)
Pt called request in home lab results from 12/17 or 12/19.  She also would like to see Dr Felecia Shelling sooner than 05/04/16, she says left leg is dragging. Pt also mentioned a problem with her bladder. Please call

## 2016-03-14 NOTE — Telephone Encounter (Signed)
I have spoken with Dawn Peters this afternoon and explained Zinbryta labs were fine.  She verbalized understanding of same.  Sts. she would like to move pending appt. up as she feels she continues to have difficulty with left leg and bladder.  (not new problems.)  I will watch for a sooner appt. and call pt. if one becomes available/fim

## 2016-03-15 NOTE — Telephone Encounter (Signed)
LMOM--Dr. Garth Bigness 11am pt. cancelled this am.  If Erva can be here at 1045,  he can see her this morning./fim

## 2016-03-15 NOTE — Telephone Encounter (Signed)
Noted/fim 

## 2016-03-15 NOTE — Telephone Encounter (Signed)
Pt ret'd RN's call. She can not come today but said "Thank you". °

## 2016-03-28 ENCOUNTER — Telehealth: Payer: Self-pay | Admitting: Neurology

## 2016-03-28 NOTE — Telephone Encounter (Signed)
Pt called back to advise she has her cell phone with her now and to please call that number.

## 2016-03-28 NOTE — Telephone Encounter (Signed)
LMTC./fim 

## 2016-03-28 NOTE — Telephone Encounter (Signed)
Patient called stating she feels she is unable to empty her bladder, patient is straining and feels as if she is leaking.  Patient states she has been taking tamsulosin with no result.  Please call   Patient also requesting refill for Amaryl-Per patient " The walking pill" (not sure of this is correct) I did not see a medication in patients chart.  Please call

## 2016-03-28 NOTE — Telephone Encounter (Signed)
Pt returned RN's call. She request to be called on home# . The call was dropped before I could get the number.

## 2016-03-29 NOTE — Telephone Encounter (Signed)
Called [phone] but phone rang without being answered--no ans. machine/fim

## 2016-03-29 NOTE — Telephone Encounter (Signed)
Patient returning nurse's call. Alternate # [phone]. Please call

## 2016-03-31 ENCOUNTER — Ambulatory Visit: Payer: Medicare Other | Admitting: Obstetrics & Gynecology

## 2016-03-31 NOTE — Telephone Encounter (Signed)
Rn call office to schedule blood work. Pt stated she is having problems with her bladder. She also stated that the nurse was schedule to do blood work at her house during the snow but had to cancel. Pt would like blood work done at [facility],  Pt would like a call on her cell number at [phone].

## 2016-03-31 NOTE — Telephone Encounter (Signed)
Pt requesting refill on ampyra. Pt wants to know the pharmacy that is going to refill it. Call her cell number thanks.

## 2016-03-31 NOTE — Telephone Encounter (Signed)
LMOM.  She should f/u with the lab tech that comes to her home for labwork--needs to r/s with them.  RAS is out of the office today and tomorrow--if she needs an appt. to discuss worsening sx., will be happy to give her one/fim

## 2016-03-31 NOTE — Telephone Encounter (Signed)
I have spoken with Dawn Peters and given her the # for Ampyra Pt. Support Services/fim

## 2016-04-18 ENCOUNTER — Telehealth: Payer: Self-pay | Admitting: Neurology

## 2016-04-18 DIAGNOSIS — G35 Multiple sclerosis: Secondary | ICD-10-CM

## 2016-04-18 DIAGNOSIS — Z79899 Other long term (current) drug therapy: Secondary | ICD-10-CM

## 2016-04-18 NOTE — Telephone Encounter (Signed)
Pt says she noticed last week she has bruises on her legs. She said the RN that comes to her house to do labs said it is probably due to lemtrada. She is wanting to get labs done, RN is in W/S all this week and cannot get her labs drawn. Please call

## 2016-04-18 NOTE — Addendum Note (Signed)
Addended by: France Ravens I on: 04/18/2016 09:39 AM   Modules accepted: Orders

## 2016-04-18 NOTE — Telephone Encounter (Signed)
I have spoken with Dawn Peters this morning.  Labs ordered, but I have reiterated to her that it is important that she have labs drawn by the lab tech, as they track labs and make sure the monthly and quarterly labs are drawn, sent in to [place], etc.  I have reached out to [facility] to ask her to look into why the lab tech was not able to draw labs/fim

## 2016-04-18 NOTE — Telephone Encounter (Signed)
I have spoken with Dawn Peters this morning--labs have been ordered and she may come in at  her convenience to have them drawn/fim

## 2016-04-18 NOTE — Telephone Encounter (Signed)
Patient calling back stating she would like to have labs drawn in our office. I did advise of previous message but she still wants labs drawn in our office. She also wants to discuss bruises on her thighs.

## 2016-04-19 ENCOUNTER — Other Ambulatory Visit (INDEPENDENT_AMBULATORY_CARE_PROVIDER_SITE_OTHER): Payer: Self-pay

## 2016-04-19 DIAGNOSIS — G35 Multiple sclerosis: Secondary | ICD-10-CM

## 2016-04-19 DIAGNOSIS — Z0289 Encounter for other administrative examinations: Secondary | ICD-10-CM

## 2016-04-19 DIAGNOSIS — Z79899 Other long term (current) drug therapy: Secondary | ICD-10-CM

## 2016-04-20 LAB — T-HELPER CELLS CD4/CD8 %
% CD 4 Pos. Lymph.: 13.2 % — ABNORMAL LOW (ref 30.8–58.5)
Absolute CD 4 Helper: 92 /uL — ABNORMAL LOW (ref 359–1519)
BASOS ABS: 0 10*3/uL (ref 0.0–0.2)
Basos: 0 %
CD3+CD4+ Cells/CD3+CD8+ Cells Bld: 1.33 (ref 0.92–3.72)
CD3+CD8+ Cells # Bld: 69 /uL — ABNORMAL LOW (ref 109–897)
CD3+CD8+ Cells NFr Bld: 9.9 % — ABNORMAL LOW (ref 12.0–35.5)
EOS (ABSOLUTE): 0.1 10*3/uL (ref 0.0–0.4)
Eos: 1 %
HEMATOCRIT: 42.3 % (ref 34.0–46.6)
HEMOGLOBIN: 14.6 g/dL (ref 11.1–15.9)
IMMATURE GRANULOCYTES: 1 %
Immature Grans (Abs): 0.1 10*3/uL (ref 0.0–0.1)
LYMPHS ABS: 0.7 10*3/uL (ref 0.7–3.1)
Lymphs: 12 %
MCH: 33.6 pg — AB (ref 26.6–33.0)
MCHC: 34.5 g/dL (ref 31.5–35.7)
MCV: 97 fL (ref 79–97)
MONOS ABS: 0.8 10*3/uL (ref 0.1–0.9)
Monocytes: 14 %
NEUTROS PCT: 72 %
Neutrophils Absolute: 4 10*3/uL (ref 1.4–7.0)
PLATELETS: 269 10*3/uL (ref 150–379)
RBC: 4.35 x10E6/uL (ref 3.77–5.28)
RDW: 13.4 % (ref 12.3–15.4)
WBC: 5.6 10*3/uL (ref 3.4–10.8)

## 2016-04-20 LAB — URINALYSIS, ROUTINE W REFLEX MICROSCOPIC
Bilirubin, UA: NEGATIVE
Glucose, UA: NEGATIVE
KETONES UA: NEGATIVE
LEUKOCYTES UA: NEGATIVE
Nitrite, UA: NEGATIVE
Protein, UA: NEGATIVE
RBC, UA: NEGATIVE
SPEC GRAV UA: 1.02 (ref 1.005–1.030)
Urobilinogen, Ur: 0.2 mg/dL (ref 0.2–1.0)
pH, UA: 5.5 (ref 5.0–7.5)

## 2016-04-20 LAB — CREATININE, SERUM
CREATININE: 1.17 mg/dL — AB (ref 0.57–1.00)
GFR calc Af Amer: 69 mL/min/{1.73_m2} (ref >59)
GFR calc non Af Amer: 60 mL/min/{1.73_m2} (ref >59)

## 2016-04-20 LAB — TSH: TSH: 1.97 u[IU]/mL (ref 0.450–4.500)

## 2016-04-21 ENCOUNTER — Telehealth: Payer: Self-pay | Admitting: Neurology

## 2016-04-21 NOTE — Telephone Encounter (Signed)
Patient calling to request lab results.  Please call

## 2016-04-22 NOTE — Telephone Encounter (Signed)
VM full.  Per RAS, last Lemtrada labs look ok.  No problems./fim

## 2016-04-25 ENCOUNTER — Telehealth: Payer: Self-pay | Admitting: Neurology

## 2016-04-25 ENCOUNTER — Other Ambulatory Visit: Payer: Self-pay | Admitting: Neurology

## 2016-04-25 MED ORDER — TRAMADOL HCL ER 300 MG PO TB24: 300.0000 mg | ORAL_TABLET | Freq: Every day | ORAL | 1 refills | Status: DC

## 2016-04-25 NOTE — Telephone Encounter (Signed)
Pt called back, said she did not rec a call from RN. I advised her RN did call but VM was full. Relayed Rn's msg. She said ok

## 2016-04-25 NOTE — Telephone Encounter (Signed)
[facility]/fim 

## 2016-04-25 NOTE — Telephone Encounter (Signed)
Rx. awaiting RAS sig/fim 

## 2016-04-25 NOTE — Telephone Encounter (Signed)
Noted/fim 

## 2016-04-25 NOTE — Telephone Encounter (Signed)
Pt request refill for traMADol (ULTRAM-ER) 300 MG 24 hr tablet [facility] to [facility]

## 2016-04-29 ENCOUNTER — Encounter: Payer: Self-pay | Admitting: Obstetrics & Gynecology

## 2016-04-29 ENCOUNTER — Encounter: Payer: Self-pay | Admitting: Neurology

## 2016-04-29 ENCOUNTER — Ambulatory Visit (INDEPENDENT_AMBULATORY_CARE_PROVIDER_SITE_OTHER): Payer: Medicare Other | Admitting: Obstetrics & Gynecology

## 2016-04-29 VITALS — BP 122/78 | HR 106 | Wt 185.9 lb

## 2016-04-29 DIAGNOSIS — Z01419 Encounter for gynecological examination (general) (routine) without abnormal findings: Secondary | ICD-10-CM | POA: Diagnosis not present

## 2016-04-29 DIAGNOSIS — Z0000 Encounter for general adult medical examination without abnormal findings: Secondary | ICD-10-CM | POA: Diagnosis not present

## 2016-04-29 LAB — POCT URINALYSIS DIP (DEVICE)
Glucose, UA: NEGATIVE mg/dL
Ketones, ur: NEGATIVE mg/dL
LEUKOCYTES UA: NEGATIVE
NITRITE: NEGATIVE
Protein, ur: NEGATIVE mg/dL
Specific Gravity, Urine: >=1.03 (ref 1.005–1.030)
Urobilinogen, UA: 0.2 mg/dL (ref 0.0–1.0)
pH: 5.5 (ref 5.0–8.0)

## 2016-04-29 LAB — POCT PREGNANCY, URINE: PREG TEST UR: NEGATIVE

## 2016-04-29 NOTE — Progress Notes (Signed)
Subjective:    Patient ID: Dawn Peters, female    DOB: 10-Dec-1980, 36 y.o.   MRN: [id number]  HPI Dawn Peters is a 36 year old female who presents to the office for an annual well women exam. She has had symptoms of a UTI that started last month including increased frequency , urgency and difficulty emptying her bladder. She also has experienced breast pain in the past year, which she attributes to weight gain. She denies any discharge.   Past Medical History:  Diagnosis Date  . Anxiety   . Depression   . Hypertension   . MS (multiple sclerosis) ([place])     Family History  Problem Relation Age of Onset  . Healthy Mother   . Healthy Father   . Breast cancer      paternal grandmother dx age 78  . Colon cancer      paternal grandmother  . Hypertension Other   . Diabetes Other    Current Outpatient Prescriptions on File Prior to Visit  Medication Sig Dispense Refill  . amitriptyline (ELAVIL) 25 MG tablet Take one or two at bedtime (Patient taking differently: Take 25 mg by mouth at bedtime as needed for sleep. ) 60 tablet 11  . baclofen (LIORESAL) 20 MG tablet Take 1 tablet (20 mg total) by mouth 3 (three) times daily as needed (for ms). 90 each 11  . cetirizine (ZYRTEC) 10 MG tablet Take 10 mg by mouth daily.   0  . citalopram (CELEXA) 40 MG tablet Take one tablet by mouth daily. (Patient taking differently: Take 40 mg by mouth daily. ) 90 tablet 3  . gabapentin (NEURONTIN) 800 MG tablet Take 1 tablet (800 mg total) by mouth 3 (three) times daily. 90 tablet 11  . lamoTRIgine (LAMICTAL) 200 MG tablet Take 200 mg by mouth 2 (two) times daily.  11  . lisinopril (PRINIVIL,ZESTRIL) 20 MG tablet TAKE 1 TABLET BY MOUTH EVERY DAY 90 tablet 1  . naproxen (NAPROSYN) 500 MG tablet Take 1 tablet (500 mg total) by mouth 2 (two) times daily as needed. 60 tablet 3  . SUMAtriptan (IMITREX) 100 MG tablet Take 1 tablet (100 mg total) by mouth once as needed for migraine. May repeat in 2 hours if  headache persists or recurs. 10 tablet 0  . tamsulosin (FLOMAX) 0.4 MG CAPS capsule Take 0.4 mg by mouth daily.  11  . tiZANidine (ZANAFLEX) 4 MG tablet Take 1 tablet (4 mg total) by mouth at bedtime. 30 tablet 11  . traMADol (ULTRAM-ER) 300 MG 24 hr tablet Take 1 tablet (300 mg total) by mouth daily. 30 tablet 1  . TRI-LO-MARZIA 0.18/0.215/0.25 MG-25 MCG tab TAKE 1 TABLET BY MOUTH DAILY. 28 tablet 5  . valACYclovir (VALTREX) 500 MG tablet Take 1 tablet (500 mg total) by mouth 2 (two) times daily. 60 tablet 2  . Alemtuzumab (LEMTRADA) 12 MG/1.2ML SOLN Inject 1 Dose into the vein as directed.    . dalfampridine 10 MG TB12 Take 10 mg by mouth 2 (two) times daily.    Marland Kitchen HYDROcodone-acetaminophen (NORCO/VICODIN) 5-325 MG tablet Take 1 tablet by mouth every 8 (eight) hours as needed.  0  . metroNIDAZOLE (FLAGYL) 500 MG tablet Take 1 tablet (500 mg total) by mouth 2 (two) times daily. (Patient not taking: Reported on 04/29/2016) 14 tablet 0  . mupirocin ointment (BACTROBAN) 2 % Apply 1 application topically 3 (three) times daily as needed.  0  . Oxcarbazepine (TRILEPTAL) 300 MG tablet Take  1 tablet (300 mg total) by mouth 2 (two) times daily. 60 tablet 1  . predniSONE (DELTASONE) 5 MG tablet Begin taking 6 tablets daily, taper by one tablet daily until off the medication. (Patient not taking: Reported on 04/29/2016) 21 tablet 0   No current facility-administered medications on file prior to visit.      Review of Systems  Constitutional: Negative for chills, fever and weight loss.  Respiratory: Negative for cough and shortness of breath.   Cardiovascular: Negative for chest pain and palpitations.  Gastrointestinal: Positive for constipation. Negative for abdominal pain, diarrhea, nausea and vomiting.  Genitourinary: Positive for frequency and urgency. Negative for dyspareunia.    Review of Systems  Constitutional: Negative for chills, fever and weight loss.  Respiratory: Negative for cough and  shortness of breath.   Cardiovascular: Negative for chest pain and palpitations.  Gastrointestinal: Positive for constipation. Negative for abdominal pain, diarrhea, nausea and vomiting.  Genitourinary: Positive for frequency and urgency. Negative for dyspareunia.  Breast: Negative for discharge. Positive for tenderness.      Objective:   Physical Exam  Constitutional: She appears well-developed and well-nourished.  HENT:  Head: Normocephalic and atraumatic.  Neck: Neck supple.  Cardiovascular:  No murmur heard. Pulmonary/Chest: Effort normal and breath sounds normal. No respiratory distress.  Abdominal: Soft. Bowel sounds are normal. She exhibits no distension. There is no tenderness.  Genitourinary: Vagina normal and uterus normal.  Skin: Skin is warm.  Psychiatric: She has a normal mood and affect.  Vitals reviewed. Breast exam: Tenderness noted on underside of breast bilaterally. No masses noted.  Gynecological: not tenderness of uterus or ovaries. No masses palpated       Assessment & Plan:  Continue taking birth control pills as normal. Follow-up in a year or sooner for any new problems/concerns.

## 2016-04-29 NOTE — Patient Instructions (Signed)
Mammogram Introduction A mammogram is an X-ray of the breasts that is done to check for changes that are not normal. This test can screen for and find any changes that may suggest breast cancer. This test can also help to find other changes and variations in the breast. What happens before the procedure?  Have this test done about 1-2 weeks after your period. This is usually when your breasts are the least tender.  If you are visiting a new doctor or clinic, send any past mammogram images to your new doctor's office.  Wash your breasts and under your arms the day of the test.  Do not use deodorants, perfumes, lotions, or powders on the day of the test.  Take off any jewelry from your neck.  Wear clothes that you can change into and out of easily. What happens during the procedure?  You will undress from the waist up. You will put on a gown.  You will stand in front of the X-ray machine.  Each breast will be placed between two plastic or glass plates. The plates will press down on your breast for a few seconds. Try to stay as relaxed as possible. This does not cause any harm to your breasts. Any discomfort you feel will be very brief.  X-rays will be taken from different angles of each breast. The procedure may vary among doctors and hospitals. What happens after the procedure?  The mammogram will be looked at by a specialist (radiologist).  You may need to do certain parts of the test again. This depends on the quality of the images.  Ask when your test results will be ready. Make sure you get your test results.  You may go back to your normal activities. This information is not intended to replace advice given to you by your health care provider. Make sure you discuss any questions you have with your health care provider. Document Released: 05/20/2008 Document Revised: 07/30/2015 Document Reviewed: 05/02/2014  2017 Elsevier

## 2016-05-04 ENCOUNTER — Ambulatory Visit (INDEPENDENT_AMBULATORY_CARE_PROVIDER_SITE_OTHER): Payer: Medicare Other | Admitting: Neurology

## 2016-05-04 ENCOUNTER — Encounter: Payer: Self-pay | Admitting: Neurology

## 2016-05-04 VITALS — BP 122/86 | HR 111 | Resp 18 | Ht 65.0 in | Wt 185.0 lb

## 2016-05-04 DIAGNOSIS — R200 Anesthesia of skin: Secondary | ICD-10-CM

## 2016-05-04 DIAGNOSIS — G35 Multiple sclerosis: Secondary | ICD-10-CM | POA: Diagnosis not present

## 2016-05-04 DIAGNOSIS — N399 Disorder of urinary system, unspecified: Secondary | ICD-10-CM

## 2016-05-04 DIAGNOSIS — R5383 Other fatigue: Secondary | ICD-10-CM | POA: Diagnosis not present

## 2016-05-04 DIAGNOSIS — R269 Unspecified abnormalities of gait and mobility: Secondary | ICD-10-CM

## 2016-05-04 DIAGNOSIS — Z79899 Other long term (current) drug therapy: Secondary | ICD-10-CM | POA: Diagnosis not present

## 2016-05-04 DIAGNOSIS — F418 Other specified anxiety disorders: Secondary | ICD-10-CM | POA: Diagnosis not present

## 2016-05-04 MED ORDER — BACLOFEN 20 MG PO TABS: 20.0000 mg | ORAL_TABLET | Freq: Three times a day (TID) | ORAL | 11 refills | Status: DC | PRN

## 2016-05-04 MED ORDER — BUSPIRONE HCL 15 MG PO TABS: 15.0000 mg | ORAL_TABLET | Freq: Two times a day (BID) | ORAL | 11 refills | Status: DC

## 2016-05-04 MED ORDER — GABAPENTIN 800 MG PO TABS: 800.0000 mg | ORAL_TABLET | Freq: Three times a day (TID) | ORAL | 11 refills | Status: DC

## 2016-05-04 MED ORDER — CITALOPRAM HYDROBROMIDE 40 MG PO TABS: 40.0000 mg | ORAL_TABLET | Freq: Every day | ORAL | 3 refills | Status: DC

## 2016-05-04 NOTE — Progress Notes (Signed)
[facility]  PATIENT: Dawn Peters DOB: [birth year]  REFERRING DOCTOR OR PCP:  Dr. Annitta Needs SOURCE: Patient, records in EMR, lab results and radiology reports in EMR, MRI images reviewed on PACS.  _________________________________   HISTORICAL  CHIEF COMPLAINT:  Chief Complaint  Patient presents with  . Multiple Sclerosis    She had first yr. Lemtrada infusions     Sts. she feels she has difficulty emptying her bladder/fim    HISTORY OF PRESENT ILLNESS:   Dawn Peters is a 36 year old woman who was diagnosed with MS in 2000.      MS:   She had breakthrough disease last year while on Tecfidera dear and switched to [place]. She had her infusions at [facility] in mid July 2017.Marland Kitchen She tolerated the infusions well.   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  Her main problems are left weakness and dysesthesias .  She denies any exacerbations since her last visit.  Gait/strength:    She has mild gait difficulties but is doing better since the AFO brace was made recently.      She stumbles some but no major falls.  She notes her left foot drops more when she is tired or walks longer distances   She is on Ampyra with some benefit.  Arms are doing ok.    Dysesthesia:   She denies numbness but has dysesthesia.  .  She is currently on gabapentin 800 mg by mouth 4 times a day and lamotrigine 200 mg twice a day for the dysesthetic pain.  Lamotrigine only helped a little bit.  Spasticity is mostly on the left, especially the leg. Baclofen and tizanidine helps him..   She is also on tramadol ER 300 mg for pain.     Vision:    She denies any MS related visual problems.  Bladder:   She had urinary frequency and urgency but had no benefit from oxybutynin or vesicare.  She has had urinary hesitancy and feels she does not empty.   At the last visit, tamsulosin was started for her hesitancy with some benefit but she still  has some hesitancy.  ..    Bowel is fine..  Fatigue/sleep:   She denies any major difficulty with fatigue. She sleeps well most nights.  Mood:  Her depression and anxiety is better with citalopram 40 mg and buspar bid.    Anxiety is more of a problem than depression  Cognition:   She denies significant difficulties with her cognitive abilities.   Focus is mildly reduced.  MS history:   She was diagnosed with MS in 2000 after presenting with gait difficulties, numbness and headaches. An MRI of the brain was consistent with MS. She was then started on Betaseron. She had difficulty tolerating Betaseron and at some point switched to Novantrone. She took Novantrone every 3 months for a year or so. A little later, she was placed on Tysabri and she stayed on it for a couple of years. However, she was JCV-positive and switched off. She did well on Tysabri. She had been on Tecfidera since 2015. Her MRI from June 2015 showed that there had been no changes compared to an MRI from 2014.   However, she had an exacerbation March 2017.   The 08/07/2013 MRI of the brain shows multiple T2/FLAIR hyperintense foci located in the cerebellum, middle cerebellar peduncles, pons and in the periventricular white matter of the  hemispheres in a pattern and configuration consistent with chronic demyelinating plaque associated with MS. There were no acute findings. There was no change compared to the previous MRI from 01/16/2013 that was also reviewed. An MRI of the cervical spine shows subtle T2 hyperintense signal within the cord and there were no acute findings.  REVIEW OF SYSTEMS: Constitutional: No fevers, chills, sweats, or change in appetite.   Notes fatigue, some insomnia Eyes: No visual changes, double vision, eye pain Ear, nose and throat: No hearing loss, ear pain, nasal congestion, sore throat Cardiovascular: No chest pain, palpitations Respiratory: No shortness of breath at rest or with exertion.   No  wheezes GastrointestinaI: No nausea, vomiting, diarrhea, abdominal pain, fecal incontinence Genitourinary: No dysuria.   Flomax has helped urinary hesitancy. 1 x nocturia. Musculoskeletal: as above Integumentary: No rash, pruritus, skin lesions Neurological: as above Psychiatric: Mild depression at this time.  Mild anxiety Endocrine: No palpitations, diaphoresis, change in appetite, change in weigh or increased thirst Hematologic/Lymphatic: No anemia, purpura, petechiae. Allergic/Immunologic: No itchy/runny eyes, nasal congestion, recent allergic reactions, rashes  ALLERGIES: No Known Allergies  HOME MEDICATIONS:  Current Outpatient Prescriptions:  .  Alemtuzumab (LEMTRADA) 12 MG/1.2ML SOLN, Inject 1 Dose into the vein as directed., Disp: , Rfl:  .  amitriptyline (ELAVIL) 25 MG tablet, Take one or two at bedtime (Patient taking differently: Take 25 mg by mouth at bedtime as needed for sleep. ), Disp: 60 tablet, Rfl: 11 .  baclofen (LIORESAL) 20 MG tablet, Take 1 tablet (20 mg total) by mouth 3 (three) times daily as needed (for ms)., Disp: 90 each, Rfl: 11 .  cetirizine (ZYRTEC) 10 MG tablet, Take 10 mg by mouth daily. , Disp: , Rfl: 0 .  citalopram (CELEXA) 40 MG tablet, Take one tablet by mouth daily. (Patient taking differently: Take 40 mg by mouth daily. ), Disp: 90 tablet, Rfl: 3 .  dalfampridine 10 MG TB12, Take 10 mg by mouth 2 (two) times daily., Disp: , Rfl:  .  gabapentin (NEURONTIN) 800 MG tablet, Take 1 tablet (800 mg total) by mouth 3 (three) times daily., Disp: 90 tablet, Rfl: 11 .  HYDROcodone-acetaminophen (NORCO/VICODIN) 5-325 MG tablet, Take 1 tablet by mouth every 8 (eight) hours as needed., Disp: , Rfl: 0 .  lamoTRIgine (LAMICTAL) 200 MG tablet, Take 200 mg by mouth 2 (two) times daily., Disp: , Rfl: 11 .  lisinopril (PRINIVIL,ZESTRIL) 20 MG tablet, TAKE 1 TABLET BY MOUTH EVERY DAY, Disp: 90 tablet, Rfl: 1 .  naproxen (NAPROSYN) 500 MG tablet, Take 1 tablet (500 mg  total) by mouth 2 (two) times daily as needed., Disp: 60 tablet, Rfl: 3 .  Oxcarbazepine (TRILEPTAL) 300 MG tablet, Take 1 tablet (300 mg total) by mouth 2 (two) times daily., Disp: 60 tablet, Rfl: 1 .  SUMAtriptan (IMITREX) 100 MG tablet, Take 1 tablet (100 mg total) by mouth once as needed for migraine. May repeat in 2 hours if headache persists or recurs., Disp: 10 tablet, Rfl: 0 .  tamsulosin (FLOMAX) 0.4 MG CAPS capsule, Take 0.4 mg by mouth daily., Disp: , Rfl: 11 .  tiZANidine (ZANAFLEX) 4 MG tablet, Take 1 tablet (4 mg total) by mouth at bedtime., Disp: 30 tablet, Rfl: 11 .  traMADol (ULTRAM-ER) 300 MG 24 hr tablet, Take 1 tablet (300 mg total) by mouth daily., Disp: 30 tablet, Rfl: 1 .  TRI-LO-MARZIA 0.18/0.215/0.25 MG-25 MCG tab, TAKE 1 TABLET BY MOUTH DAILY., Disp: 28 tablet, Rfl: 5 .  valACYclovir (VALTREX) 500  MG tablet, Take 1 tablet (500 mg total) by mouth 2 (two) times daily., Disp: 60 tablet, Rfl: 2  PAST MEDICAL HISTORY: Past Medical History:  Diagnosis Date  . Anxiety   . Depression   . Hypertension   . MS (multiple sclerosis) ([place])     PAST SURGICAL HISTORY: Past Surgical History:  Procedure Laterality Date  . BILATERAL HIP ARTHROSCOPY Left 07/2013  . JOINT REPLACEMENT Bilateral     R 2004 and L 2005    FAMILY HISTORY: Family History  Problem Relation Age of Onset  . Healthy Mother   . Healthy Father   . Breast cancer      paternal grandmother dx age 83  . Colon cancer      paternal grandmother  . Hypertension Other   . Diabetes Other     SOCIAL HISTORY:  Social History   Social History  . Marital status: Single    Spouse name: N/A  . Number of children: N/A  . Years of education: N/A   Occupational History  . Not on file.   Social History Main Topics  . Smoking status: Never Smoker  . Smokeless tobacco: Never Used  . Alcohol use No  . Drug use: No  . Sexual activity: Not Currently    Birth control/ protection: Pill   Other Topics Concern   . Not on file   Social History Narrative  . No narrative on file     PHYSICAL EXAM  Vitals:   05/04/16 1100  BP: 122/86  Pulse: (!) 111  Resp: 18  Weight: 185 lb (83.9 kg)  Height: 5\' 5"  (1.651 m)    Body mass index is 30.79 kg/m.   General: The patient is well-developed and well-nourished and in no acute distress.  Musculoskeletal:   She has good neck ROM.     Neurologic Exam  Mental status: The patient is alert and oriented x 3 at the time of the examination. The patient has apparent normal recent and remote memory, with mildly reduced attention span and concentration ability.   Speech is normal.  Cranial nerves: Extraocular movements are full.  There is good facial sensation to soft touch bilaterally.Facial strength is normal.  Trapezius and sternocleidomastoid strength is normal. No dysarthria is noted.  The tongue is midline, and the patient has symmetric elevation of the soft palate. No obvious hearing deficits are noted.  Motor:  Muscle bulk is normal.   Muscle tone is increased in the left leg.. Strength is  5 / 5 in all 4 extremities.   Sensory: Sensory testing is currently intact to touch and vibration sensation in all 4 extremities.  Coordination: Cerebellar testing reveals good finger-nose-finger   Gait and station: Station is now near normal.     Gait is wide, tandem is poor.   She has a mild left foot drop.    Reflexes: Deep tendon reflexes are increased in legs, left > right and there is spread at the knees (worse on left) and  nonsustained clonus at the left ankles.         DIAGNOSTIC DATA (LABS, IMAGING, TESTING) - I reviewed patient records, labs, notes, testing and imaging myself where available.  Lab Results  Component Value Date   WBC 5.6 04/19/2016   HGB 12.5 12/22/2015   HCT 42.3 04/19/2016   MCV 97 04/19/2016   PLT 269 04/19/2016      Component Value Date/Time   NA 145 (H) 01/04/2016 1438   K 4.1 01/04/2016  1438   CL 102 01/04/2016  1438   CO2 27 01/04/2016 1438   GLUCOSE 72 01/04/2016 1438   GLUCOSE 138 (H) 12/22/2015 0417   BUN 12 01/04/2016 1438   CREATININE 1.17 (H) 04/19/2016 1358   CREATININE 0.85 07/03/2014 0959   CALCIUM 9.6 01/04/2016 1438   PROT 6.9 12/21/2015 2244   PROT 7.2 06/03/2015 1635   ALBUMIN 3.7 12/21/2015 2244   ALBUMIN 4.4 06/03/2015 1635   AST 17 12/21/2015 2244   ALT 11 (L) 12/21/2015 2244   ALKPHOS 58 12/21/2015 2244   BILITOT 0.4 12/21/2015 2244   BILITOT 0.3 06/03/2015 1635   GFRNONAA 60 04/19/2016 1358   GFRNONAA 88 07/03/2014 0959   GFRAA 69 04/19/2016 1358   GFRAA >89 07/03/2014 0959   No results found for: CHOL, HDL, LDLCALC, LDLDIRECT, TRIG, CHOLHDL Lab Results  Component Value Date   HGBA1C 4.7 11/05/2012   No results found for: VITAMINB12 Lab Results  Component Value Date   TSH 1.970 04/19/2016       ASSESSMENT AND PLAN  MULTIPLE SCLEROSIS - Plan: MR BRAIN W WO CONTRAST  Gait disturbance - Plan: MR BRAIN W WO CONTRAST  Numbness  Urinary disorder  Other fatigue  Depression with anxiety  High risk medication use    1.   She has been compliant with her monthly blood and urine tests required for Lemtrada use. Her next infusion should be in mid July.  She will have [facility] infusion at [facility]. 2.    Continue medications for anxiety, pain and spasticity 3.    Return to see me in 4 months or sooner if there are new or worsening neurologic symptoms.  Tona Qualley A. Felecia Shelling, MD, PhD [id number], [id number] AM Certified in Neurology, Clinical Neurophysiology, Sleep Medicine, Pain Medicine and Neuroimaging  [facility] [address] [place], [place] [address] [phone] ]

## 2016-05-21 ENCOUNTER — Other Ambulatory Visit: Payer: Self-pay | Admitting: Neurology

## 2016-05-24 ENCOUNTER — Encounter: Payer: Self-pay | Admitting: Neurology

## 2016-05-27 ENCOUNTER — Encounter: Payer: Self-pay | Admitting: Neurology

## 2016-06-01 ENCOUNTER — Other Ambulatory Visit: Payer: Medicare Other

## 2016-06-06 ENCOUNTER — Telehealth: Payer: Self-pay | Admitting: Neurology

## 2016-06-06 NOTE — Telephone Encounter (Signed)
I have spoken with Dawn Peters today.  She is not sure which specialty pharmacy was shipping her [place].  I have given her Ampyra's phone #--she will call them, and they can help her determine which pharmacy her rx. was sent to/fim

## 2016-06-06 NOTE — Telephone Encounter (Signed)
Pt calling re: her prescription of  Amprya, she has misplaced the telephone# to call for her refill

## 2016-06-10 ENCOUNTER — Telehealth: Payer: Self-pay | Admitting: Neurology

## 2016-06-10 ENCOUNTER — Telehealth: Payer: Self-pay | Admitting: *Deleted

## 2016-06-10 DIAGNOSIS — M25512 Pain in left shoulder: Secondary | ICD-10-CM

## 2016-06-10 DIAGNOSIS — G35 Multiple sclerosis: Secondary | ICD-10-CM

## 2016-06-10 DIAGNOSIS — G8929 Other chronic pain: Secondary | ICD-10-CM

## 2016-06-10 DIAGNOSIS — M549 Dorsalgia, unspecified: Secondary | ICD-10-CM

## 2016-06-10 DIAGNOSIS — M25511 Pain in right shoulder: Secondary | ICD-10-CM

## 2016-06-10 MED ORDER — METHYLPREDNISOLONE 4 MG PO TBPK: ORAL_TABLET | ORAL | 0 refills | Status: DC

## 2016-06-10 NOTE — Telephone Encounter (Signed)
Duplicate/fim 

## 2016-06-10 NOTE — Telephone Encounter (Signed)
I have spoken with Dawn Peters, and per RAS. offered pain mx. referral.  She sts. she is upset, does not want pain mx. referral, only wants Hydrocodone now for leg pain. Sts. she has had MS for 20 yrs. and knows what she needs. Would like steroid pk. as well.  She is yelling, loud, sts. 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  I could not get her to calm down, and did have to end our call.  I did call her back, advised that we need to make sure she is followed for [name] is calmer now. Accepted referral to pain mx.  Per RAS ok, Medrol dose pk. escribed to pharmacy. Pt. sts. she now does not want to change doctors, sts. "Y'all just need to listen to me, I know what my body needs."  I have explained that future outbursts may result in dismissal form our office, and she has verbalized understanding of same/fim

## 2016-06-10 NOTE — Telephone Encounter (Signed)
I received message that pt. returned my call.  I called back, but again received vm.  LMOM again, that if she would like referral to pain mx, RAS is happy to provide this for her/fim

## 2016-06-10 NOTE — Telephone Encounter (Signed)
I have spoken with Dawn Peters this morning.  She c/o increased left leg, back, bilat shoulder pain.  Sts. Tramadol helps some, not enough.  Sts. she is taking Baclofen and Naproxen with minimal relief as well.  Req. Hydrocodone.  Will check with RAS and call her back/fim

## 2016-06-10 NOTE — Telephone Encounter (Signed)
[place], and per RAS, will offer pain mx. referral/fim

## 2016-06-10 NOTE — Addendum Note (Signed)
Addended by: France Ravens I on: 06/10/2016 11:51 AM   Modules accepted: Orders

## 2016-06-10 NOTE — Telephone Encounter (Signed)
Patient called office in reference to her left leg weakness and heaviness going on about 3 days.  Patient is leaning over due to the pain of her legs causing problems walking.  Patient is requesting to have something for the pain.  Pharmacy- [facility] [place] [facility].

## 2016-06-23 ENCOUNTER — Other Ambulatory Visit: Payer: Self-pay | Admitting: Neurology

## 2016-06-27 NOTE — Telephone Encounter (Signed)
Pt called back for status of refill, she said she is out of this medication.

## 2016-06-28 NOTE — Telephone Encounter (Signed)
Please call pt with the status of this refill

## 2016-07-15 ENCOUNTER — Encounter (HOSPITAL_COMMUNITY): Payer: Self-pay | Admitting: *Deleted

## 2016-07-15 ENCOUNTER — Ambulatory Visit (HOSPITAL_COMMUNITY)
Admission: EM | Admit: 2016-07-15 | Discharge: 2016-07-15 | Disposition: A | Discharge status: Home / Self Care | Payer: Medicare Other | Attending: Internal Medicine | Admitting: Internal Medicine

## 2016-07-15 ENCOUNTER — Encounter: Payer: Medicare Other | Attending: Physical Medicine & Rehabilitation | Admitting: Physical Medicine & Rehabilitation

## 2016-07-15 DIAGNOSIS — R220 Localized swelling, mass and lump, head: Secondary | ICD-10-CM

## 2016-07-15 DIAGNOSIS — S0083XA Contusion of other part of head, initial encounter: Secondary | ICD-10-CM

## 2016-07-15 MED ORDER — NAPROXEN 375 MG PO TABS: 375.0000 mg | ORAL_TABLET | Freq: Two times a day (BID) | ORAL | 0 refills | Status: DC

## 2016-07-15 NOTE — Discharge Instructions (Signed)
Place ice on the left side of your face to help the swelling. This should go down and the next couple days. Take the medication as needed for swelling and pain. For any worsening, new symptoms or problems may need to go to the emergency department or follow-up with your primary care doctor.

## 2016-07-15 NOTE — ED Provider Notes (Signed)
CSN: [id number]     Arrival date & time 07/15/16  1900 History   First MD Initiated Contact with Patient 07/15/16 1958     Chief Complaint  Patient presents with  . Facial Swelling   (Consider location/radiation/quality/duration/timing/severity/associated sxs/prior Treatment) 36 year old female states that there was a mistake with the police and that they had rushed her and set of another person and during the handcuffing states she fell and struck the left side of her mouth/cheek. She is complaining of primarily swelling to the corner of the left side of the chin and the lip. This is her only complaint of injury.      Past Medical History:  Diagnosis Date  . Anxiety   . Depression   . Hypertension   . MS (multiple sclerosis) ([place])    Past Surgical History:  Procedure Laterality Date  . BILATERAL HIP ARTHROSCOPY Left 07/2013  . JOINT REPLACEMENT Bilateral     R 2004 and L 2005   Family History  Problem Relation Age of Onset  . Healthy Mother   . Healthy Father   . Breast cancer Unknown        paternal grandmother dx age 33  . Colon cancer Unknown        paternal grandmother  . Hypertension Other   . Diabetes Other    Social History  Substance Use Topics  . Smoking status: Never Smoker  . Smokeless tobacco: Never Used  . Alcohol use No   OB History    Gravida Para Term Preterm AB Living   1 1 1     1    SAB TAB Ectopic Multiple Live Births           1     Review of Systems  Constitutional: Negative for activity change, chills and fever.  HENT: Negative.  Negative for dental problem, ear pain and sore throat.   Eyes: Negative.   Respiratory: Negative.   Cardiovascular: Negative.   Musculoskeletal:       As per HPI  Skin: Negative for color change, pallor and rash.  Neurological: Negative.     Allergies  Patient has no known allergies.  Home Medications   Prior to Admission medications   Medication Sig Start Date End Date Taking? Authorizing Provider   Alemtuzumab (LEMTRADA) 12 MG/1.2ML SOLN Inject 1 Dose into the vein as directed.    [provider]  amitriptyline (ELAVIL) 25 MG tablet Take one or two at bedtime Patient taking differently: Take 25 mg by mouth at bedtime as needed for sleep.  10/30/15   Sater, Nanine Means, MD  baclofen (LIORESAL) 20 MG tablet Take 1 tablet (20 mg total) by mouth 3 (three) times daily as needed (for ms). 05/04/16   Sater, Nanine Means, MD  busPIRone (BUSPAR) 15 MG tablet Take 1 tablet (15 mg total) by mouth 2 (two) times daily. 05/04/16   Sater, Nanine Means, MD  cetirizine (ZYRTEC) 10 MG tablet Take 10 mg by mouth daily.  08/17/15   [provider]  citalopram (CELEXA) 40 MG tablet Take 1 tablet (40 mg total) by mouth daily. 05/04/16   Sater, Nanine Means, MD  dalfampridine 10 MG TB12 Take 10 mg by mouth 2 (two) times daily.    [provider]  gabapentin (NEURONTIN) 800 MG tablet Take 1 tablet (800 mg total) by mouth 3 (three) times daily. 05/04/16   Sater, Nanine Means, MD  HYDROcodone-acetaminophen (NORCO/VICODIN) 5-325 MG tablet Take 1 tablet by mouth every 8 (eight)  hours as needed. 11/25/15   [provider]  lamoTRIgine (LAMICTAL) 200 MG tablet Take 200 mg by mouth 2 (two) times daily. 11/01/15   [provider]  lisinopril (PRINIVIL,ZESTRIL) 20 MG tablet TAKE 1 TABLET BY MOUTH EVERY DAY 04/26/16   Sater, Nanine Means, MD  methylPREDNISolone (MEDROL DOSEPAK) 4 MG TBPK tablet Take by mouth as directed. Take 6 tablets on day one, then decrease by one tablet daily until gone/fim 06/10/16   Sater, Nanine Means, MD  naproxen (NAPROSYN) 375 MG tablet Take 1 tablet (375 mg total) by mouth 2 (two) times daily. 07/15/16   Janne Napoleon, NP  Oxcarbazepine (TRILEPTAL) 300 MG tablet Take 1 tablet (300 mg total) by mouth 2 (two) times daily. 11/11/15   Sater, Nanine Means, MD  SUMAtriptan (IMITREX) 100 MG tablet Take 1 tablet (100 mg total) by mouth once as needed for migraine. May repeat in 2 hours if headache  persists or recurs. 11/18/15   Joy, Shawn C, PA-C  tamsulosin (FLOMAX) 0.4 MG CAPS capsule Take 0.4 mg by mouth daily. 11/02/15   [provider]  tiZANidine (ZANAFLEX) 4 MG tablet Take 1 tablet (4 mg total) by mouth at bedtime. 05/13/15   Sater, Nanine Means, MD  traMADol (ULTRAM-ER) 300 MG 24 hr tablet TAKE 1 TABLET EVERY DAY 06/28/16   Marcial Pacas, MD  TRI-LO-MARZIA 0.18/0.215/0.25 MG-25 MCG tab TAKE 1 TABLET BY MOUTH DAILY. 02/05/16   Woodroe Mode, MD  valACYclovir (VALTREX) 500 MG tablet Take 1 tablet (500 mg total) by mouth 2 (two) times daily. 11/02/15   Sater, Nanine Means, MD   Meds Ordered and Administered this Visit  Medications - No data to display  BP 119/65 (BP Location: Right Arm)   Pulse (!) 106   Temp 98.9 F (37.2 C) (Oral)   Resp 18   LMP 07/04/2016   SpO2 100%  No data found.   Physical Exam  Constitutional: She is oriented to person, place, and time. She appears well-developed and well-nourished. No distress.  HENT:  Head: Normocephalic.  There is mild swelling to the left side of the chin to include a small area of the lower lip and across the mandible. No skin lesions. She is able to completely open her jaw and close it she is able to move the jaw side to side. No TMJ tenderness. The swelling does not extend that far up. No intraoral lesions. Teeth are intact. No bleeding, no lesions to the buccal mucosa no swelling.  Eyes: EOM are normal. Pupils are equal, round, and reactive to light.  Neck: Normal range of motion. Neck supple.  No tenderness to the neck. No spinal tenderness. Full range of motion.  Cardiovascular: Normal rate.   Pulmonary/Chest: Effort normal.  Lymphadenopathy:    She has no cervical adenopathy.  Neurological: She is alert and oriented to person, place, and time.  Skin: Skin is warm and dry.  Psychiatric: She has a normal mood and affect.  Nursing note and vitals reviewed.   Urgent Care Course     Procedures (including critical care  time)  Labs Review Labs Reviewed - No data to display  Imaging Review No results found.   Visual Acuity Review  Right Eye Distance:   Left Eye Distance:   Bilateral Distance:    Right Eye Near:   Left Eye Near:    Bilateral Near:         MDM   1. Facial swelling   2. Contusion of face,  initial encounter    Place ice on the left side of your face to help the swelling. This should go down and the next couple days. Take the medication as needed for swelling and pain. For any worsening, new symptoms or problems may need to go to the emergency department or follow-up with your primary care doctor. Meds ordered this encounter  Medications  . naproxen (NAPROSYN) 375 MG tablet    Sig: Take 1 tablet (375 mg total) by mouth 2 (two) times daily.    Dispense:  20 tablet    Refill:  0    Order Specific Question:   Supervising Provider    Answer:   Sherlene Shams [[id number]]       Janne Napoleon, NP 07/15/16 2016

## 2016-07-15 NOTE — ED Triage Notes (Signed)
L  SIDE  FACIAL  SWELLING   THAT  STARTED    YEST   PT   STATES    WAS  FELLED  YEST        SCRATCHED  ON  R    ARM     ALSO  REPOERTS   PAIN   R  HIP    WHEN  SHE  [place]

## 2016-07-20 ENCOUNTER — Other Ambulatory Visit: Payer: Self-pay | Admitting: Neurology

## 2016-07-21 ENCOUNTER — Encounter: Payer: Self-pay | Admitting: Neurology

## 2016-08-01 ENCOUNTER — Telehealth: Payer: Self-pay | Admitting: Neurology

## 2016-08-01 NOTE — Telephone Encounter (Signed)
Patient called Saturday, 07-30-2016 , 23; 09 hours with a report of possible MS relapse. I found this message only at 7 AM the next day, attempted 3 times to call her - 7.30, 9.00 and 11.30 AM- she called back after noon, was rather incoherent and stated the left leg felt heavy and she had balance problems. Walking unsteady- she sounded as if she slurred her speech.  I encouraged her to go to the ED- may be get steroids. She stated " I will go later , may be tonight " had no transportation. She will call Tuesday for update. CD

## 2016-08-01 NOTE — Telephone Encounter (Signed)
If not better lets get her to come in for steroids

## 2016-08-02 NOTE — Telephone Encounter (Signed)
VM full/fim 

## 2016-08-02 NOTE — Telephone Encounter (Signed)
Dawn Peters can see pt. in the infusion suite at 8am tomorrow.  I attempted to contact pt. but she did not answer her phone and vm is full/fim

## 2016-08-02 NOTE — Telephone Encounter (Signed)
I spoke with Dawn Peters this afternoon.  She reports still having difficulty with gait--"stumbling".  Did not go to the ER. "I didn't feel like waiting up there."  SM 1gm IV offered per RAS, and she is agreeable.  Will check with [name] in the infusion suite regarding a time and call pt. back./fim

## 2016-08-04 NOTE — Telephone Encounter (Signed)
Noted/fim 

## 2016-08-04 NOTE — Telephone Encounter (Signed)
I have spoken with Dawn Peters this morning and given appt. time 8am tomorrow for SM 1gm IV.  Orders given to [facility] in the infusion suite/fim

## 2016-08-04 NOTE — Telephone Encounter (Signed)
Pt said she had to drop her son off at work tomorrow she could be here at 8:30. Per Faith she has to be at the clinic at [place]. Patient said she could be dropped off and agreed she would be on time.  FYI

## 2016-08-04 NOTE — Telephone Encounter (Signed)
Patient called office to speak with RN about scheduling infusion.  Please call

## 2016-08-05 ENCOUNTER — Encounter: Payer: Self-pay | Admitting: Neurology

## 2016-08-05 ENCOUNTER — Ambulatory Visit (INDEPENDENT_AMBULATORY_CARE_PROVIDER_SITE_OTHER): Payer: Medicare Other | Admitting: Neurology

## 2016-08-05 VITALS — BP 136/89 | HR 103 | Resp 16 | Ht 65.0 in | Wt 185.0 lb

## 2016-08-05 DIAGNOSIS — M21372 Foot drop, left foot: Secondary | ICD-10-CM

## 2016-08-05 DIAGNOSIS — Z79899 Other long term (current) drug therapy: Secondary | ICD-10-CM

## 2016-08-05 DIAGNOSIS — R269 Unspecified abnormalities of gait and mobility: Secondary | ICD-10-CM | POA: Diagnosis not present

## 2016-08-05 DIAGNOSIS — R200 Anesthesia of skin: Secondary | ICD-10-CM | POA: Diagnosis not present

## 2016-08-05 DIAGNOSIS — G35 Multiple sclerosis: Secondary | ICD-10-CM | POA: Diagnosis not present

## 2016-08-05 DIAGNOSIS — N399 Disorder of urinary system, unspecified: Secondary | ICD-10-CM | POA: Diagnosis not present

## 2016-08-05 MED ORDER — TRAMADOL HCL ER 300 MG PO TB24: 300.0000 mg | ORAL_TABLET | Freq: Every day | ORAL | 3 refills | Status: DC

## 2016-08-05 MED ORDER — LAMOTRIGINE 200 MG PO TABS: 200.0000 mg | ORAL_TABLET | Freq: Two times a day (BID) | ORAL | 11 refills | Status: DC

## 2016-08-05 NOTE — Progress Notes (Signed)
[facility]  PATIENT: Dawn Peters DOB: 06/08/1980  REFERRING DOCTOR OR PCP:  Dr. Annitta Needs SOURCE: Patient, records in EMR, lab results and radiology reports in EMR, MRI images reviewed on PACS.  _________________________________   HISTORICAL  CHIEF COMPLAINT:  Chief Complaint  Patient presents with  . Multiple Sclerosis    Increased difficulty walking for the last 2-3 wks.  2nd yr. Lemtrada infusions due in July/fim    HISTORY OF PRESENT ILLNESS:   Dawn Peters is a 36 year-old woman who was diagnosed with MS in 2000.   Two weeks ago, gait worsened.   She notes both clumsiness and weakness in her legs.  The left leg is worse but right is also involved.    She has had more trouble with her left side in general over the years and has a left AFO brace that she sometimes uses for her foot drop (at baseline).     MS:   She had breakthrough disease in 2016 while on Tecfidera dear and switched to [place]. Due to insurance issues, she had her infusions at [facility] in mid July 2017. She tolerated her infusions well. She is JCV high titer positive and had switched from Tysabri to [place] earlier.     Gait/strength:    Over the last 2 weeks her gait has done worse. She does have an AFO for the left leg that helps her walking she finds that her balance is still greatly affected.    She is on Ampyra with some benefit.  Arms are doing ok.    Dysesthesia:   She has painful left leg dysesthesia.  .  She is currently on gabapentin 800 mg by mouth 4 times a day and lamotrigine 200 mg twice a day for the dysesthetic pain.  With her medications and tramadol she feels that the pain is tolerable.  Spasticity is mostly on the left, especially the leg. Baclofen and tizanidine helps her spasticity.    Vision:    She denies any MS related visual problems.  Bladder:   She feels her urinary hesitancy has worsened over the past couple weeks. She still has some frequency and  urgency.  Stenosis and is helping the hesitancy some..    Bowel is fine..  Fatigue/sleep:   She notes mild fatigue. She sleeps well most nights.  Mood:  Her depression and anxiety is better with citalopram 40 mg and buspar bid.    Anxiety is more of a problem than depression and she feels that the medications are not completely controlling this.  Cognition:   She denies significant difficulties with her cognitive abilities.   Focus is mildly reduced.  MS history:   She was diagnosed with MS in 2000 after presenting with gait difficulties, numbness and headaches. An MRI of the brain was consistent with MS. She was then started on Betaseron. She had difficulty tolerating Betaseron and at some point switched to Novantrone. She took Novantrone every 3 months for a year or so. A little later, she was placed on Tysabri and she stayed on it for a couple of years. However, she was JCV-positive and switched off. She did well on Tysabri. She had been on Tecfidera since 2015. Her MRI from June 2015 showed that there had been no changes compared to an MRI from 2014.   However, she had an exacerbation March 2017.   The 08/07/2013 MRI of the brain shows multiple T2/FLAIR hyperintense foci located in the cerebellum, middle cerebellar  peduncles, pons and in the periventricular white matter of the hemispheres in a pattern and configuration consistent with chronic demyelinating plaque associated with MS. There were no acute findings. There was no change compared to the previous MRI from 01/16/2013 that was also reviewed. An MRI of the cervical spine shows subtle T2 hyperintense signal within the cord and there were no acute findings.  REVIEW OF SYSTEMS: Constitutional: No fevers, chills, sweats, or change in appetite.   Notes fatigue, some insomnia Eyes: No visual changes, double vision, eye pain Ear, nose and throat: No hearing loss, ear pain, nasal congestion, sore throat Cardiovascular: No chest pain,  palpitations Respiratory: No shortness of breath at rest or with exertion.   No wheezes GastrointestinaI: No nausea, vomiting, diarrhea, abdominal pain, fecal incontinence Genitourinary: No dysuria.   She has hesitancy. Sometimes she has difficulty emptying completely Flomax has helped urinary hesitancy. 1 x nocturia. Musculoskeletal: as above Integumentary: No rash, pruritus, skin lesions Neurological: as above Psychiatric: Mild depression at this time.  Moderate anxiety Endocrine: No palpitations, diaphoresis, change in appetite, change in weigh or increased thirst Hematologic/Lymphatic: No anemia, purpura, petechiae. Allergic/Immunologic: No itchy/runny eyes, nasal congestion, recent allergic reactions, rashes  ALLERGIES: No Known Allergies  HOME MEDICATIONS:  Current Outpatient Prescriptions:  .  Alemtuzumab (LEMTRADA) 12 MG/1.2ML SOLN, Inject 1 Dose into the vein as directed., Disp: , Rfl:  .  amitriptyline (ELAVIL) 25 MG tablet, Take one or two at bedtime (Patient taking differently: Take 25 mg by mouth at bedtime as needed for sleep. ), Disp: 60 tablet, Rfl: 11 .  baclofen (LIORESAL) 20 MG tablet, Take 1 tablet (20 mg total) by mouth 3 (three) times daily as needed (for ms)., Disp: 90 each, Rfl: 11 .  busPIRone (BUSPAR) 15 MG tablet, Take 1 tablet (15 mg total) by mouth 2 (two) times daily., Disp: 60 tablet, Rfl: 11 .  citalopram (CELEXA) 40 MG tablet, Take 1 tablet (40 mg total) by mouth daily., Disp: 90 tablet, Rfl: 3 .  dalfampridine 10 MG TB12, Take 10 mg by mouth 2 (two) times daily., Disp: , Rfl:  .  gabapentin (NEURONTIN) 800 MG tablet, Take 1 tablet (800 mg total) by mouth 3 (three) times daily., Disp: 90 tablet, Rfl: 11 .  HYDROcodone-acetaminophen (NORCO/VICODIN) 5-325 MG tablet, Take 1 tablet by mouth every 8 (eight) hours as needed., Disp: , Rfl: 0 .  lamoTRIgine (LAMICTAL) 200 MG tablet, Take 1 tablet (200 mg total) by mouth 2 (two) times daily., Disp: 60 tablet,  Rfl: 11 .  lisinopril (PRINIVIL,ZESTRIL) 20 MG tablet, TAKE 1 TABLET BY MOUTH EVERY DAY, Disp: 90 tablet, Rfl: 1 .  naproxen (NAPROSYN) 375 MG tablet, Take 1 tablet (375 mg total) by mouth 2 (two) times daily., Disp: 20 tablet, Rfl: 0 .  Oxcarbazepine (TRILEPTAL) 300 MG tablet, Take 1 tablet (300 mg total) by mouth 2 (two) times daily., Disp: 60 tablet, Rfl: 1 .  SUMAtriptan (IMITREX) 100 MG tablet, Take 1 tablet (100 mg total) by mouth once as needed for migraine. May repeat in 2 hours if headache persists or recurs., Disp: 10 tablet, Rfl: 0 .  tiZANidine (ZANAFLEX) 4 MG tablet, Take 1 tablet (4 mg total) by mouth at bedtime., Disp: 30 tablet, Rfl: 11 .  traMADol (ULTRAM-ER) 300 MG 24 hr tablet, Take 1 tablet (300 mg total) by mouth daily., Disp: 30 tablet, Rfl: 3 .  TRI-LO-MARZIA 0.18/0.215/0.25 MG-25 MCG tab, TAKE 1 TABLET BY MOUTH DAILY., Disp: 28 tablet, Rfl: 5 .  cetirizine (  ZYRTEC) 10 MG tablet, Take 10 mg by mouth daily. , Disp: , Rfl: 0 .  methylPREDNISolone (MEDROL DOSEPAK) 4 MG TBPK tablet, Take by mouth as directed. Take 6 tablets on day one, then decrease by one tablet daily until gone/fim (Patient not taking: Reported on 08/05/2016), Disp: 21 tablet, Rfl: 0 .  tamsulosin (FLOMAX) 0.4 MG CAPS capsule, Take 0.4 mg by mouth daily., Disp: , Rfl: 11 .  valACYclovir (VALTREX) 500 MG tablet, Take 1 tablet (500 mg total) by mouth 2 (two) times daily. (Patient not taking: Reported on 08/05/2016), Disp: 60 tablet, Rfl: 2  PAST MEDICAL HISTORY: Past Medical History:  Diagnosis Date  . Anxiety   . Depression   . Hypertension   . MS (multiple sclerosis) ([place])     PAST SURGICAL HISTORY: Past Surgical History:  Procedure Laterality Date  . BILATERAL HIP ARTHROSCOPY Left 07/2013  . JOINT REPLACEMENT Bilateral     R 2004 and L 2005    FAMILY HISTORY: Family History  Problem Relation Age of Onset  . Healthy Mother   . Healthy Father   . Breast cancer Unknown        paternal grandmother dx  age 33  . Colon cancer Unknown        paternal grandmother  . Hypertension Other   . Diabetes Other     SOCIAL HISTORY:  Social History   Social History  . Marital status: Single    Spouse name: N/A  . Number of children: N/A  . Years of education: N/A   Occupational History  . Not on file.   Social History Main Topics  . Smoking status: Never Smoker  . Smokeless tobacco: Never Used  . Alcohol use No  . Drug use: No  . Sexual activity: Not Currently    Birth control/ protection: Pill   Other Topics Concern  . Not on file   Social History Narrative  . No narrative on file     PHYSICAL EXAM  Vitals:   08/05/16 0900  BP: 136/89  Pulse: (!) 103  Resp: 16  Weight: 185 lb (83.9 kg)  Height: 5\' 5"  (1.651 m)    Body mass index is 30.79 kg/m.   General: The patient is well-developed and well-nourished and in no acute distress.  Musculoskeletal:   She has good neck ROM.     Neurologic Exam  Mental status: The patient is alert and oriented x 3 at the time of the examination. The patient has apparent normal recent and remote memory, with mildly reduced attention span and concentration ability.   Speech is normal.  Cranial nerves: Extraocular movements are full.  There is good facial sensation to soft touch bilaterally.Facial strength is normal.  Trapezius and sternocleidomastoid strength is normal. No dysarthria is noted.  The tongue is midline, and the patient has symmetric elevation of the soft palate. No obvious hearing deficits are noted.  Motor:  Muscle bulk is normal.   Muscle tone is increased in the left leg.. Strength is  5 / 5 in all 4 extremities.   Sensory: Sensory testing is Intact to touch and vibration sensation in all 4 extremities.  Coordination: Cerebellar testing reveals good finger-nose-finger .   There is slightly reduced heel-to-shin on the right but her left leg has poor heel-to-shin.  Gait and station: Station is now near normal.    Her  gait is wide and requires unilateral support to do more than a few steps. She cannot tandem walk. This  has worsened compared to her previous visit.  She has a left foot drop.    Reflexes: Deep tendon reflexes are Increased in her legs, left greater than right. There is spread at the knees bilaterally, worse on the left, and nonsustained clonus on the right and sustained clonus on the left at the ankles   DIAGNOSTIC DATA (LABS, IMAGING, TESTING) - I reviewed patient records, labs, notes, testing and imaging myself where available.  Lab Results  Component Value Date   WBC 5.6 04/19/2016   HGB 12.5 12/22/2015   HCT 42.3 04/19/2016   MCV 97 04/19/2016   PLT 269 04/19/2016      Component Value Date/Time   NA 145 (H) 01/04/2016 1438   K 4.1 01/04/2016 1438   CL 102 01/04/2016 1438   CO2 27 01/04/2016 1438   GLUCOSE 72 01/04/2016 1438   GLUCOSE 138 (H) 12/22/2015 0417   BUN 12 01/04/2016 1438   CREATININE 1.17 (H) 04/19/2016 1358   CREATININE 0.85 07/03/2014 0959   CALCIUM 9.6 01/04/2016 1438   PROT 6.9 12/21/2015 2244   PROT 7.2 06/03/2015 1635   ALBUMIN 3.7 12/21/2015 2244   ALBUMIN 4.4 06/03/2015 1635   AST 17 12/21/2015 2244   ALT 11 (L) 12/21/2015 2244   ALKPHOS 58 12/21/2015 2244   BILITOT 0.4 12/21/2015 2244   BILITOT 0.3 06/03/2015 1635   GFRNONAA 60 04/19/2016 1358   GFRNONAA 88 07/03/2014 0959   GFRAA 69 04/19/2016 1358   GFRAA >89 07/03/2014 0959   No results found for: CHOL, HDL, LDLCALC, LDLDIRECT, TRIG, CHOLHDL Lab Results  Component Value Date   HGBA1C 4.7 11/05/2012   No results found for: VITAMINB12 Lab Results  Component Value Date   TSH 1.970 04/19/2016      ASSESSMENT AND PLAN  Multiple sclerosis exacerbation (HCC) - Plan: MR BRAIN W WO CONTRAST, MR CERVICAL SPINE W WO CONTRAST, MR THORACIC SPINE W WO CONTRAST  Gait disturbance - Plan: MR BRAIN W WO CONTRAST, MR CERVICAL SPINE W WO CONTRAST, MR THORACIC SPINE W WO CONTRAST  High risk  medication use  Numbness  Urinary disorder  Foot drop, left - Plan: MR CERVICAL SPINE W WO CONTRAST, MR THORACIC SPINE W WO CONTRAST   1.   She has been compliant with her monthly blood and urine tests required for Lemtrada use. Her next infusion should be in mid July.  We will schedule 2.    She appears to be having an exacerbation. We will have her get the Solu-Medrol day. If not better then continue with 2 or more days of IV Solu-Medrol next week.   We need to check brain, cerv and thor spine MRI to determine if significant subclinical progression on top of the relapses.   If so, consider change to ocrelizumab.   3.   Continue medications for anxiety, pain and spasticity 4.   Return to see me in 4 months or sooner if there are new or worsening neurologic symptoms.  Richard A. Felecia Shelling, MD, PhD 0/04/6376, 5:88 AM Certified in Neurology, Clinical Neurophysiology, Sleep Medicine, Pain Medicine and Neuroimaging  [facility] [address] [place], [place] [address] [phone] ]

## 2016-08-08 ENCOUNTER — Telehealth: Payer: Self-pay | Admitting: Neurology

## 2016-08-08 MED ORDER — DALFAMPRIDINE ER 10 MG PO TB12
10.0000 mg | ORAL_TABLET | Freq: Two times a day (BID) | ORAL | 11 refills | Status: DC
Start: 2016-08-08 — End: 2017-05-23

## 2016-08-08 NOTE — Telephone Encounter (Signed)
I have spoken with Dawn Peters this afternoon.  She will call back with the name of the specialty pharmacy she gets Ampyra from.  She sts. she has not had any change in Medicaid/Medicare since last ov, so she would still need to go to Dr. Donata Duff office for yr. 2 infusions.  I have reached out to [name] [facility] rep) via text, to see if she can work on getting infusions scheduled/fim

## 2016-08-08 NOTE — Telephone Encounter (Signed)
Pt returned RN's call. Call transferred to RN Medicaid# [id number] R

## 2016-08-08 NOTE — Addendum Note (Signed)
Addended by: France Ravens I on: 08/08/2016 05:30 PM   Modules accepted: Orders

## 2016-08-08 NOTE — Telephone Encounter (Signed)
Ampyra escribed to [facility]/fim

## 2016-08-08 NOTE — Telephone Encounter (Signed)
LMOM.  I need 2 things--1) remind her I need her new Medicaid/Medicare cards before we can order her Ocrevus.  2--which specialty pharmacy is shipping her Ampyra to her?

## 2016-08-08 NOTE — Telephone Encounter (Signed)
Patient called office requesting refill for Amprya.  Please call

## 2016-08-08 NOTE — Telephone Encounter (Signed)
Dawn Peters/Dawn Peters Peters [phone] request refill for ampyra.

## 2016-08-10 ENCOUNTER — Telehealth: Payer: Self-pay | Admitting: Neurology

## 2016-08-10 NOTE — Telephone Encounter (Signed)
I returned telephone call from patient who ststed she did not like iv solumedrol which Dr Felecia Shelling started today and prefers oral Prednisone taper.I advised her to call tomorrow morning for Dr Felecia Shelling to decide and let her know. She voiced understanding

## 2016-08-11 MED ORDER — METHYLPREDNISOLONE 4 MG PO TBPK: ORAL_TABLET | ORAL | 0 refills | Status: DC

## 2016-08-11 NOTE — Addendum Note (Signed)
Addended by: France Ravens on: 08/11/2016 04:36 PM   Modules accepted: Orders

## 2016-08-11 NOTE — Telephone Encounter (Signed)
LMOM that per RAS, ok for Medrol dose pk.  Rx. escribed to [facility] on [address].  She can have this rx. transferred to another pharmacy if needed/fim

## 2016-08-11 NOTE — Telephone Encounter (Signed)
Pt called today. Please call to discuss message below

## 2016-08-13 ENCOUNTER — Encounter (HOSPITAL_COMMUNITY): Payer: Self-pay

## 2016-08-13 ENCOUNTER — Observation Stay (HOSPITAL_COMMUNITY)
Admission: EM | Admit: 2016-08-13 | Discharge: 2016-08-14 | Disposition: A | Discharge status: Home / Self Care | Payer: Medicare Other | Attending: Family Medicine | Admitting: Family Medicine

## 2016-08-13 ENCOUNTER — Inpatient Hospital Stay (HOSPITAL_COMMUNITY): Payer: Medicare Other

## 2016-08-13 DIAGNOSIS — F419 Anxiety disorder, unspecified: Secondary | ICD-10-CM | POA: Insufficient documentation

## 2016-08-13 DIAGNOSIS — G8929 Other chronic pain: Secondary | ICD-10-CM | POA: Diagnosis not present

## 2016-08-13 DIAGNOSIS — N289 Disorder of kidney and ureter, unspecified: Secondary | ICD-10-CM | POA: Diagnosis not present

## 2016-08-13 DIAGNOSIS — R531 Weakness: Secondary | ICD-10-CM | POA: Diagnosis present

## 2016-08-13 DIAGNOSIS — I10 Essential (primary) hypertension: Secondary | ICD-10-CM | POA: Diagnosis not present

## 2016-08-13 DIAGNOSIS — F329 Major depressive disorder, single episode, unspecified: Secondary | ICD-10-CM | POA: Diagnosis not present

## 2016-08-13 DIAGNOSIS — R29898 Other symptoms and signs involving the musculoskeletal system: Secondary | ICD-10-CM | POA: Diagnosis present

## 2016-08-13 DIAGNOSIS — G35 Multiple sclerosis: Principal | ICD-10-CM | POA: Insufficient documentation

## 2016-08-13 DIAGNOSIS — Z79899 Other long term (current) drug therapy: Secondary | ICD-10-CM | POA: Insufficient documentation

## 2016-08-13 LAB — CBC WITH DIFFERENTIAL/PLATELET
BASOS PCT: 0 %
Basophils Absolute: 0 10*3/uL (ref 0.0–0.1)
EOS ABS: 0.1 10*3/uL (ref 0.0–0.7)
EOS PCT: 2 %
HCT: 38.6 % (ref 36.0–46.0)
HEMOGLOBIN: 13.5 g/dL (ref 12.0–15.0)
LYMPHS ABS: 0.9 10*3/uL (ref 0.7–4.0)
Lymphocytes Relative: 14 %
MCH: 33.4 pg (ref 26.0–34.0)
MCHC: 35 g/dL (ref 30.0–36.0)
MCV: 95.5 fL (ref 78.0–100.0)
Monocytes Absolute: 0.4 10*3/uL (ref 0.1–1.0)
Monocytes Relative: 7 %
NEUTROS PCT: 77 %
Neutro Abs: 5.2 10*3/uL (ref 1.7–7.7)
PLATELETS: 258 10*3/uL (ref 150–400)
RBC: 4.04 MIL/uL (ref 3.87–5.11)
RDW: 14.1 % (ref 11.5–15.5)
WBC: 6.6 10*3/uL (ref 4.0–10.5)

## 2016-08-13 LAB — COMPREHENSIVE METABOLIC PANEL
ALBUMIN: 3.9 g/dL (ref 3.5–5.0)
ALT: 21 U/L (ref 14–54)
ANION GAP: 8 (ref 5–15)
AST: 24 U/L (ref 15–41)
Alkaline Phosphatase: 96 U/L (ref 38–126)
BUN: 22 mg/dL — ABNORMAL HIGH (ref 6–20)
CHLORIDE: 102 mmol/L (ref 101–111)
CO2: 28 mmol/L (ref 22–32)
Calcium: 9.2 mg/dL (ref 8.9–10.3)
Creatinine, Ser: 1.34 mg/dL — ABNORMAL HIGH (ref 0.44–1.00)
GFR calc non Af Amer: 50 mL/min — ABNORMAL LOW (ref >60)
GFR, EST AFRICAN AMERICAN: 57 mL/min — AB (ref >60)
GLUCOSE: 95 mg/dL (ref 65–99)
Potassium: 3.7 mmol/L (ref 3.5–5.1)
Sodium: 138 mmol/L (ref 135–145)
Total Bilirubin: 0.4 mg/dL (ref 0.3–1.2)
Total Protein: 7.3 g/dL (ref 6.5–8.1)

## 2016-08-13 LAB — URINALYSIS, ROUTINE W REFLEX MICROSCOPIC
Bilirubin Urine: NEGATIVE
GLUCOSE, UA: NEGATIVE mg/dL
Hgb urine dipstick: NEGATIVE
Ketones, ur: NEGATIVE mg/dL
LEUKOCYTES UA: NEGATIVE
NITRITE: NEGATIVE
PH: 5 (ref 5.0–8.0)
Protein, ur: NEGATIVE mg/dL
SPECIFIC GRAVITY, URINE: 1.023 (ref 1.005–1.030)

## 2016-08-13 LAB — GLUCOSE, CAPILLARY: Glucose-Capillary: 178 mg/dL — ABNORMAL HIGH (ref 65–99)

## 2016-08-13 MED ORDER — VALACYCLOVIR HCL 500 MG PO TABS
500.0000 mg | ORAL_TABLET | Freq: Every day | ORAL | Status: DC
  Administered 2016-08-13 – 2016-08-14 (×2): 500 mg via ORAL
  Filled 2016-08-13 (×2): qty 1

## 2016-08-13 MED ORDER — NORGESTIM-ETH ESTRAD TRIPHASIC 0.18/0.215/0.25 MG-25 MCG PO TABS: 1.0000 | ORAL_TABLET | Freq: Every day | ORAL | Status: DC

## 2016-08-13 MED ORDER — BISACODYL 5 MG PO TBEC: 5.0000 mg | DELAYED_RELEASE_TABLET | Freq: Every day | ORAL | Status: DC | PRN

## 2016-08-13 MED ORDER — HYDROCODONE-ACETAMINOPHEN 5-325 MG PO TABS
1.0000 | ORAL_TABLET | ORAL | Status: DC | PRN
  Administered 2016-08-13: 1 via ORAL
  Administered 2016-08-13: 2 via ORAL
  Filled 2016-08-13: qty 2
  Filled 2016-08-13: qty 1

## 2016-08-13 MED ORDER — LORAZEPAM 2 MG/ML IJ SOLN
1.0000 mg | Freq: Once | INTRAMUSCULAR | Status: AC
  Administered 2016-08-13: 1 mg via INTRAVENOUS
  Filled 2016-08-13: qty 1

## 2016-08-13 MED ORDER — DALFAMPRIDINE ER 10 MG PO TB12: 10.0000 mg | ORAL_TABLET | Freq: Two times a day (BID) | ORAL | Status: DC

## 2016-08-13 MED ORDER — ACETAMINOPHEN 650 MG RE SUPP: 650.0000 mg | Freq: Four times a day (QID) | RECTAL | Status: DC | PRN

## 2016-08-13 MED ORDER — SODIUM CHLORIDE 0.9 % IV SOLN
1000.0000 mg | Freq: Once | INTRAVENOUS | Status: AC
  Administered 2016-08-13: 1000 mg via INTRAVENOUS
  Filled 2016-08-13: qty 8

## 2016-08-13 MED ORDER — CITALOPRAM HYDROBROMIDE 20 MG PO TABS
40.0000 mg | ORAL_TABLET | Freq: Every day | ORAL | Status: DC
  Administered 2016-08-13 – 2016-08-14 (×2): 40 mg via ORAL
  Filled 2016-08-13 (×2): qty 2

## 2016-08-13 MED ORDER — SODIUM CHLORIDE 0.9 % IV SOLN
1000.0000 mg | INTRAVENOUS | Status: DC
  Administered 2016-08-14: 1000 mg via INTRAVENOUS
  Filled 2016-08-13 (×3): qty 8

## 2016-08-13 MED ORDER — ONDANSETRON HCL 4 MG PO TABS: 4.0000 mg | ORAL_TABLET | Freq: Four times a day (QID) | ORAL | Status: DC | PRN

## 2016-08-13 MED ORDER — HYDRALAZINE HCL 20 MG/ML IJ SOLN: 5.0000 mg | INTRAMUSCULAR | Status: DC | PRN

## 2016-08-13 MED ORDER — POLYETHYLENE GLYCOL 3350 17 G PO PACK: 17.0000 g | PACK | Freq: Every day | ORAL | Status: DC | PRN

## 2016-08-13 MED ORDER — SODIUM CHLORIDE 0.9 % IV SOLN
INTRAVENOUS | Status: AC
  Administered 2016-08-13 (×2): via INTRAVENOUS

## 2016-08-13 MED ORDER — GABAPENTIN 400 MG PO CAPS
800.0000 mg | ORAL_CAPSULE | Freq: Three times a day (TID) | ORAL | Status: DC
  Administered 2016-08-13 – 2016-08-14 (×4): 800 mg via ORAL
  Filled 2016-08-13 (×3): qty 2
  Filled 2016-08-13: qty 8

## 2016-08-13 MED ORDER — ACETAMINOPHEN 325 MG PO TABS: 650.0000 mg | ORAL_TABLET | Freq: Four times a day (QID) | ORAL | Status: DC | PRN

## 2016-08-13 MED ORDER — BACLOFEN 10 MG PO TABS: 20.0000 mg | ORAL_TABLET | Freq: Three times a day (TID) | ORAL | Status: DC | PRN

## 2016-08-13 MED ORDER — TAMSULOSIN HCL 0.4 MG PO CAPS
0.4000 mg | ORAL_CAPSULE | Freq: Every day | ORAL | Status: DC
  Administered 2016-08-13 – 2016-08-14 (×2): 0.4 mg via ORAL
  Filled 2016-08-13 (×2): qty 1

## 2016-08-13 MED ORDER — GADOBENATE DIMEGLUMINE 529 MG/ML IV SOLN
20.0000 mL | Freq: Once | INTRAVENOUS | Status: AC | PRN
  Administered 2016-08-13: 20 mL via INTRAVENOUS

## 2016-08-13 MED ORDER — AMITRIPTYLINE HCL 25 MG PO TABS: 50.0000 mg | ORAL_TABLET | Freq: Every evening | ORAL | Status: DC | PRN

## 2016-08-13 MED ORDER — TIZANIDINE HCL 4 MG PO TABS
4.0000 mg | ORAL_TABLET | Freq: Every day | ORAL | Status: DC
  Administered 2016-08-13: 4 mg via ORAL
  Filled 2016-08-13: qty 1

## 2016-08-13 MED ORDER — BUSPIRONE HCL 10 MG PO TABS
15.0000 mg | ORAL_TABLET | Freq: Two times a day (BID) | ORAL | Status: DC
  Administered 2016-08-13 – 2016-08-14 (×3): 15 mg via ORAL
  Filled 2016-08-13 (×3): qty 1.5
  Filled 2016-08-13 (×3): qty 3

## 2016-08-13 MED ORDER — LAMOTRIGINE 100 MG PO TABS
200.0000 mg | ORAL_TABLET | Freq: Two times a day (BID) | ORAL | Status: DC
  Administered 2016-08-13 – 2016-08-14 (×3): 200 mg via ORAL
  Filled 2016-08-13 (×3): qty 2

## 2016-08-13 MED ORDER — ONDANSETRON HCL 4 MG/2ML IJ SOLN: 4.0000 mg | Freq: Four times a day (QID) | INTRAMUSCULAR | Status: DC | PRN

## 2016-08-13 MED ORDER — SUMATRIPTAN SUCCINATE 25 MG PO TABS
25.0000 mg | ORAL_TABLET | Freq: Three times a day (TID) | ORAL | Status: DC | PRN
  Administered 2016-08-13: 25 mg via ORAL
  Filled 2016-08-13: qty 1

## 2016-08-13 MED ORDER — OXCARBAZEPINE 300 MG PO TABS
300.0000 mg | ORAL_TABLET | Freq: Two times a day (BID) | ORAL | Status: DC
  Administered 2016-08-13 – 2016-08-14 (×3): 300 mg via ORAL
  Filled 2016-08-13 (×3): qty 1

## 2016-08-13 MED ORDER — ENOXAPARIN SODIUM 40 MG/0.4ML ~~LOC~~ SOLN
40.0000 mg | SUBCUTANEOUS | Status: DC
  Administered 2016-08-13 – 2016-08-14 (×2): 40 mg via SUBCUTANEOUS
  Filled 2016-08-13 (×2): qty 0.4

## 2016-08-13 NOTE — H&P (Signed)
History and Physical    Dawn Peters QPY:[id number] DOB: October 09, 1980 DOA: 08/13/2016  PCP: System, Pcp Not In   Patient coming from: Home  Chief Complaint: Left-sided weakness with falls   HPI: Dawn Peters is a 36 y.o. female with medical history significant for hypertension, depression, anxiety, and multiple sclerosis, presenting to the emergency department with 2 weeks of progressive left-sided weakness and resulting falls. Patient reports that she been in her usual state of health until approximately 2 weeks ago when she noted the insidious development of weakness involving the left side, primarily the left leg. The symptoms have continued to progress over the ensuing weeks and she has fallen secondary to this. She is followed by neurology in the outpatient setting and was treated with a dose of 1 g IV Solu-Medrol on 08/05/2016 for suspected MS flare. She was to return for additional doses per chart notes, but did not. Since that time, her condition is continued worsening. She denies any head strike or loss of consciousness with falls and she denies any significant pain or injury associated with that. She denies any recent fevers or chills, denies headache, or change in vision or hearing.   ED Course: Upon arrival to the ED, patient is found to be afebrile, saturating well on room air, and with vital signs stable. Chemistry panels notable for a BUN of 22 and serum creatinine 1.34, up from 0.9 in October 2017. CBC is unremarkable and urinalysis is unremarkable. Patient was treated with 1 g of IV Solu-Medrol in the emergency department and neurology was consulted by the ED physician. Neurology has evaluated the patient emergency department, suspects a flare in MS is causing her symptoms, and advises a medical admission for MRI to confirm, and continued treatment with IV Solu-Medrol.  Review of Systems:  All other systems reviewed and apart from HPI, are negative.  Past Medical History:    Diagnosis Date  . Anxiety   . Depression   . Hypertension   . MS (multiple sclerosis) ([place])     Past Surgical History:  Procedure Laterality Date  . BILATERAL HIP ARTHROSCOPY Left 07/2013  . JOINT REPLACEMENT Bilateral     R 2004 and L 2005     reports that she has never smoked. She has never used smokeless tobacco. She reports that she does not drink alcohol or use drugs.  No Known Allergies  Family History  Problem Relation Age of Onset  . Healthy Mother   . Healthy Father   . Breast cancer Unknown        paternal grandmother dx age 16  . Colon cancer Unknown        paternal grandmother  . Hypertension Other   . Diabetes Other      Prior to Admission medications   Medication Sig Start Date End Date Taking? Authorizing Provider  Alemtuzumab (LEMTRADA) 12 MG/1.2ML SOLN Inject 12 mg into the vein See admin instructions. Pt receives every 3 years.   Yes [provider]  amitriptyline (ELAVIL) 25 MG tablet Take one or two at bedtime Patient taking differently: Take 50 mg by mouth at bedtime as needed for sleep.  10/30/15  Yes Sater, Nanine Means, MD  baclofen (LIORESAL) 20 MG tablet Take 1 tablet (20 mg total) by mouth 3 (three) times daily as needed (for ms). Patient taking differently: Take 20 mg by mouth 3 (three) times daily as needed for muscle spasms.  05/04/16  Yes Sater, Nanine Means, MD  busPIRone (BUSPAR) 15  MG tablet Take 1 tablet (15 mg total) by mouth 2 (two) times daily. 05/04/16  Yes Sater, Nanine Means, MD  citalopram (CELEXA) 40 MG tablet Take 1 tablet (40 mg total) by mouth daily. 05/04/16  Yes Sater, Nanine Means, MD  dalfampridine 10 MG TB12 Take 1 tablet (10 mg total) by mouth 2 (two) times daily. 08/08/16  Yes Sater, Nanine Means, MD  gabapentin (NEURONTIN) 800 MG tablet Take 1 tablet (800 mg total) by mouth 3 (three) times daily. 05/04/16  Yes Sater, Nanine Means, MD  HYDROcodone-acetaminophen (NORCO/VICODIN) 5-325 MG tablet Take 1 tablet by mouth 3 (three) times daily  as needed for moderate pain.    Yes [provider]  lamoTRIgine (LAMICTAL) 200 MG tablet Take 1 tablet (200 mg total) by mouth 2 (two) times daily. 08/05/16  Yes Sater, Nanine Means, MD  lisinopril (PRINIVIL,ZESTRIL) 20 MG tablet TAKE 1 TABLET BY MOUTH EVERY DAY 04/26/16  Yes Sater, Nanine Means, MD  naproxen (NAPROSYN) 375 MG tablet Take 1 tablet (375 mg total) by mouth 2 (two) times daily. Patient taking differently: Take 375 mg by mouth 2 (two) times daily as needed for mild pain.  07/15/16  Yes Mabe, Shanon Brow, NP  Oxcarbazepine (TRILEPTAL) 300 MG tablet Take 1 tablet (300 mg total) by mouth 2 (two) times daily. 11/11/15  Yes Sater, Nanine Means, MD  SUMAtriptan (IMITREX) 100 MG tablet Take 1 tablet (100 mg total) by mouth once as needed for migraine. May repeat in 2 hours if headache persists or recurs. 11/18/15  Yes Joy, Shawn C, PA-C  tamsulosin (FLOMAX) 0.4 MG CAPS capsule Take 0.4 mg by mouth daily.   Yes [provider]  tiZANidine (ZANAFLEX) 4 MG tablet Take 1 tablet (4 mg total) by mouth at bedtime. 05/13/15  Yes Sater, Nanine Means, MD  traMADol (ULTRAM-ER) 300 MG 24 hr tablet Take 1 tablet (300 mg total) by mouth daily. Patient taking differently: Take 300 mg by mouth daily as needed for pain.  08/05/16  Yes Sater, Nanine Means, MD  TRI-LO-MARZIA 0.18/0.215/0.25 MG-25 MCG tab TAKE 1 TABLET BY MOUTH DAILY. 02/05/16  Yes Woodroe Mode, MD  valACYclovir (VALTREX) 500 MG tablet Take 1 tablet (500 mg total) by mouth 2 (two) times daily. Patient taking differently: Take 500 mg by mouth daily.  11/02/15  Yes Sater, Nanine Means, MD    Physical Exam: Vitals:   08/13/16 0035 08/13/16 0304  BP: 115/78 119/84  Pulse: 78 93  Resp: 18 20  Temp: 98.2 F (36.8 C) 97.9 F (36.6 C)  TempSrc: Oral Oral  SpO2: 100% 100%      Constitutional: NAD, calm, comfortable Eyes: PERTLA, lids and conjunctivae normal ENMT: Mucous membranes are moist. Posterior pharynx clear of any exudate or lesions.   Neck:  normal, supple, no masses, no thyromegaly Respiratory: clear to auscultation bilaterally, no wheezing, no crackles. Normal respiratory effort.  Cardiovascular: S1 & S2 heard, regular rate and rhythm. No extremity edema. No significant JVD. Abdomen: No distension, no tenderness, no masses palpated. Bowel sounds normal.  Musculoskeletal: no clubbing / cyanosis. No joint deformity upper and lower extremities.   Skin: no significant rashes, lesions, ulcers. Warm, dry, well-perfused. Neurologic: CN 2-12 grossly intact. Sensation intact. Strength 2-3/5 in proximal left leg, 4/5 distally.   Psychiatric:  Alert and oriented x 3. Calm and cooperative.     Labs on Admission: I have personally reviewed following labs and imaging studies  CBC:  Recent Labs Lab 08/13/16 0232  WBC 6.6  NEUTROABS 5.2  HGB 13.5  HCT 38.6  MCV 95.5  PLT 774   Basic Metabolic Panel:  Recent Labs Lab 08/13/16 0232  NA 138  K 3.7  CL 102  CO2 28  GLUCOSE 95  BUN 22*  CREATININE 1.34*  CALCIUM 9.2   GFR: Estimated Creatinine Clearance: 60.9 mL/min (A) (by C-G formula based on SCr of 1.34 mg/dL (H)). Liver Function Tests:  Recent Labs Lab 08/13/16 0232  AST 24  ALT 21  ALKPHOS 96  BILITOT 0.4  PROT 7.3  ALBUMIN 3.9   No results for input(s): LIPASE, AMYLASE in the last 168 hours. No results for input(s): AMMONIA in the last 168 hours. Coagulation Profile: No results for input(s): INR, PROTIME in the last 168 hours. Cardiac Enzymes: No results for input(s): CKTOTAL, CKMB, CKMBINDEX, TROPONINI in the last 168 hours. BNP (last 3 results) No results for input(s): PROBNP in the last 8760 hours. HbA1C: No results for input(s): HGBA1C in the last 72 hours. CBG: No results for input(s): GLUCAP in the last 168 hours. Lipid Profile: No results for input(s): CHOL, HDL, LDLCALC, TRIG, CHOLHDL, LDLDIRECT in the last 72 hours. Thyroid Function Tests: No results for input(s): TSH, T4TOTAL, FREET4,  T3FREE, THYROIDAB in the last 72 hours. Anemia Panel: No results for input(s): VITAMINB12, FOLATE, FERRITIN, TIBC, IRON, RETICCTPCT in the last 72 hours. Urine analysis:    Component Value Date/Time   COLORURINE YELLOW 08/13/2016 0250   APPEARANCEUR CLEAR 08/13/2016 0250   APPEARANCEUR Clear 04/19/2016 1419   LABSPEC 1.023 08/13/2016 0250   PHURINE 5.0 08/13/2016 0250   GLUCOSEU NEGATIVE 08/13/2016 0250   HGBUR NEGATIVE 08/13/2016 0250   BILIRUBINUR NEGATIVE 08/13/2016 0250   BILIRUBINUR Negative 04/19/2016 1419   KETONESUR NEGATIVE 08/13/2016 0250   PROTEINUR NEGATIVE 08/13/2016 0250   UROBILINOGEN 0.2 04/29/2016 1112   NITRITE NEGATIVE 08/13/2016 0250   LEUKOCYTESUR NEGATIVE 08/13/2016 0250   LEUKOCYTESUR Negative 04/19/2016 1419   Sepsis Labs: @LABRCNTIP (procalcitonin:4,lacticidven:4) )No results found for this or any previous visit (from the past 240 hour(s)).   Radiological Exams on Admission: No results found.  EKG: Not performed.  Assessment/Plan  1. Left-sided weakness; Multiple sclerosis with suspected flare  - Suspected secondary to flare in MS  - Neurology is consulting and much appreciated   - MRI for confirmation, 1 g IV Solu-Medrol q24h x3, and PT per neuro recs - Continue fampridine, baclofen     2. Renal insufficiency, mild  - SCr 1.34 on admission, up from 0.9 in October 2017  - Unclear chronicity  - Plan to hold lisinopril and NSAIDs, provide a gentle IV hydration overnight, and repeat chemistries in am    3. Hypertension  - BP at goal  - Managed at home with lisinopril, currently held as above  - Treat with prn hydralazine IVP's, resume lisinopril as appropriate    4. Depression, anxiety  - Stable  - Continue current management with Celexa, Buspar, Elavil, Lamictal     DVT prophylaxis: sq Lovenox  Code Status: Full  Family Communication: Discussed with patient Disposition Plan: Admit to med-surg Consults called: Neurology Admission status:  Inpatient    Vianne Bulls, MD [facility] Pager [phone]  If 7PM-7AM, please contact night-coverage [email or web address] Password [email or web address]  08/13/2016, 3:51 AM

## 2016-08-13 NOTE — ED Triage Notes (Signed)
Pt reports extremity pain and weakness r/t MS dx.

## 2016-08-13 NOTE — ED Provider Notes (Signed)
[facility] DEPT Provider Note   CSN: [id number] Arrival date & time: 08/13/16  0029     History   Chief Complaint Chief Complaint  Patient presents with  . Extremity Weakness    HPI Dawn Peters is a 36 y.o. female.  HPI   Pt is a 36 yo female with PMH of MS, HTN and depression/anxiety who presents to the ED with complaint of MS flare. Pt reports over the past 2 weeks She has had gradually worsening weakness to her left leg. Endorses associated tingling and pain to her left leg. She reports initially been seen by her neurologist on 08/05/16 where she received a dose of IV Solu-Medrol. She states she was initially scheduled to have 2 more doses of IV steroids but notes she has not received them and has not been taking any oral prednisone at home. She notes over the past week her symptoms have continued to worsen resulting in her having multiple falls at home due to her left leg weakness. Denies any head injury or LOC. Patient states she has been taking her home prescription of Lemtrada as prescribed. Denies fever, headache, neck/back pain, lightheadedness, dizziness, visual changes, cough, shortness of breath, chest pain, abdominal pain, nausea, vomiting, diarrhea, urinary symptoms, numbness, facial weakness, slurred speech. Patient reports her symptoms are consistent with her typical MS flares. She notes she has not scheduled to see her neurologist until July.  Neurologist- Dr. Felecia Shelling [facility])  Past Medical History:  Diagnosis Date  . Anxiety   . Depression   . Hypertension   . MS (multiple sclerosis) [facility])     Patient Active Problem List   Diagnosis Date Noted  . High risk medication use 09/15/2015  . Hip pain, bilateral 07/28/2015  . Urinary hesitancy 07/28/2015  . Multiple sclerosis exacerbation ([place]) 07/17/2015  . Urinary disorder 06/11/2015  . Gait disturbance 05/19/2015  . Numbness 05/19/2015  . Depression with anxiety 05/13/2015  . Other fatigue  05/13/2015  . Left knee pain 05/13/2015  . Avascular necrosis of bones of both hips ([place]) 05/13/2015  . Screening for HIV (human immunodeficiency virus) 07/08/2014  . Essential hypertension, benign 11/19/2013  . Anxiety state, unspecified 11/19/2013  . Screen for STD (sexually transmitted disease) 11/01/2013  . Encounter for routine gynecological examination 11/01/2013  . Oral contraceptive use 10/20/2011  . MULTIPLE SCLEROSIS 08/08/2007  . RASH AND OTHER NONSPECIFIC SKIN ERUPTION 06/22/2007  . AVASCULAR NECROSIS, FEMORAL HEAD 03/11/2006  . Essential hypertension 02/03/2006  . 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 DVT, HX OF 02/03/2006    Past Surgical History:  Procedure Laterality Date  . BILATERAL HIP ARTHROSCOPY Left 07/2013  . JOINT REPLACEMENT Bilateral     R 2004 and L 2005    OB History    Gravida Para Term Preterm AB Living   1 1 1     1    SAB TAB Ectopic Multiple Live Births           1       Home Medications    Prior to Admission medications   Medication Sig Start Date End Date Taking? Authorizing Provider  amitriptyline (ELAVIL) 25 MG tablet Take one or two at bedtime Patient taking differently: Take 50 mg by mouth at bedtime as needed for sleep.  10/30/15  Yes Sater, Nanine Means, MD  baclofen (LIORESAL) 20 MG tablet Take 1 tablet (20 mg total) by mouth 3 (three) times daily as needed (for ms). Patient taking differently: Take 20 mg by mouth 3 (  three) times daily as needed for muscle spasms.  05/04/16  Yes Sater, Nanine Means, MD  busPIRone (BUSPAR) 15 MG tablet Take 1 tablet (15 mg total) by mouth 2 (two) times daily. 05/04/16  Yes Sater, Nanine Means, MD  citalopram (CELEXA) 40 MG tablet Take 1 tablet (40 mg total) by mouth daily. 05/04/16  Yes Sater, Nanine Means, MD  dalfampridine 10 MG TB12 Take 1 tablet (10 mg total) by mouth 2 (two) times daily. 08/08/16  Yes Sater, Nanine Means, MD  gabapentin (NEURONTIN) 800 MG tablet Take 1 tablet (800 mg total) by mouth 3 (three) times daily.  05/04/16  Yes Sater, Nanine Means, MD  lamoTRIgine (LAMICTAL) 200 MG tablet Take 1 tablet (200 mg total) by mouth 2 (two) times daily. 08/05/16  Yes Sater, Nanine Means, MD  lisinopril (PRINIVIL,ZESTRIL) 20 MG tablet TAKE 1 TABLET BY MOUTH EVERY DAY 04/26/16  Yes Sater, Nanine Means, MD  Alemtuzumab (LEMTRADA) 12 MG/1.2ML SOLN Inject 1 Dose into the vein as directed.    [provider]  cetirizine (ZYRTEC) 10 MG tablet Take 10 mg by mouth daily.  08/17/15   [provider]  HYDROcodone-acetaminophen (NORCO/VICODIN) 5-325 MG tablet Take 1 tablet by mouth every 8 (eight) hours as needed. 11/25/15   [provider]  naproxen (NAPROSYN) 375 MG tablet Take 1 tablet (375 mg total) by mouth 2 (two) times daily. 07/15/16   Janne Napoleon, NP  Oxcarbazepine (TRILEPTAL) 300 MG tablet Take 1 tablet (300 mg total) by mouth 2 (two) times daily. 11/11/15   Sater, Nanine Means, MD  SUMAtriptan (IMITREX) 100 MG tablet Take 1 tablet (100 mg total) by mouth once as needed for migraine. May repeat in 2 hours if headache persists or recurs. 11/18/15   Joy, Shawn C, PA-C  tamsulosin (FLOMAX) 0.4 MG CAPS capsule Take 0.4 mg by mouth daily. 11/02/15   [provider]  tiZANidine (ZANAFLEX) 4 MG tablet Take 1 tablet (4 mg total) by mouth at bedtime. 05/13/15   Sater, Nanine Means, MD  traMADol (ULTRAM-ER) 300 MG 24 hr tablet Take 1 tablet (300 mg total) by mouth daily. 08/05/16   Sater, Nanine Means, MD  TRI-LO-MARZIA 0.18/0.215/0.25 MG-25 MCG tab TAKE 1 TABLET BY MOUTH DAILY. 02/05/16   Woodroe Mode, MD  valACYclovir (VALTREX) 500 MG tablet Take 1 tablet (500 mg total) by mouth 2 (two) times daily. Patient not taking: Reported on 08/05/2016 11/02/15   Britt Bottom, MD    Family History Family History  Problem Relation Age of Onset  . Healthy Mother   . Healthy Father   . Breast cancer Unknown        paternal grandmother dx age 67  . Colon cancer Unknown        paternal grandmother  . Hypertension Other   .  Diabetes Other     Social History Social History  Substance Use Topics  . Smoking status: Never Smoker  . Smokeless tobacco: Never Used  . Alcohol use No     Allergies   Patient has no known allergies.   Review of Systems Review of Systems  Musculoskeletal:       Left leg pain  Neurological: Positive for weakness.       Tingling  All other systems reviewed and are negative.    Physical Exam Updated Vital Signs BP 115/78 (BP Location: Right Arm)   Pulse 78   Temp 98.2 F (36.8 C) (Oral)   Resp 18   LMP 08/04/2016  SpO2 100%   Physical Exam  Constitutional: She is oriented to person, place, and time. She appears well-developed and well-nourished. No distress.  HENT:  Head: Normocephalic and atraumatic.  Mouth/Throat: Oropharynx is clear and moist. No oropharyngeal exudate.  Eyes: Conjunctivae and EOM are normal. Pupils are equal, round, and reactive to light. Right eye exhibits no discharge. Left eye exhibits no discharge. No scleral icterus.  Neck: Normal range of motion. Neck supple.  Cardiovascular: Normal rate, regular rhythm, normal heart sounds and intact distal pulses.   Pulmonary/Chest: Effort normal and breath sounds normal. No respiratory distress. She has no wheezes. She has no rales. She exhibits no tenderness.  Abdominal: Soft. Bowel sounds are normal. She exhibits no distension and no mass. There is no tenderness. There is no rebound and no guarding.  Musculoskeletal: Normal range of motion. She exhibits no edema, tenderness or deformity.  No midline C, T, or L tenderness. Full range of motion of neck and back. Full range of motion of bilateral upper extremities, with 5/5 strength. Sensation intact. 2+ radial and DP pulses. Cap refill <2 seconds.   Neurological: She is alert and oriented to person, place, and time. No cranial nerve deficit or sensory deficit. She displays a negative Romberg sign. Coordination normal.  FROM of BLE. 5/5 strength present to  right leg. 5/5 strength present to left plantar and dorsiflexion. 4/5 strength present with left hip and knee flexion/extension. Sensation grossly intact.   Skin: Skin is warm and dry. She is not diaphoretic.  Nursing note and vitals reviewed.    ED Treatments / Results  Labs (all labs ordered are listed, but only abnormal results are displayed) Labs Reviewed  CBC WITH DIFFERENTIAL/PLATELET  COMPREHENSIVE METABOLIC PANEL    EKG  EKG Interpretation None       Radiology No results found.  Procedures Procedures (including critical care time)  Medications Ordered in ED Medications  methylPREDNISolone sodium succinate (SOLU-MEDROL) 1,000 mg in sodium chloride 0.9 % 50 mL IVPB (not administered)     Initial Impression / Assessment and Plan / ED Course  I have reviewed the triage vital signs and the nursing notes.  Pertinent labs & imaging results that were available during my care of the patient were reviewed by me and considered in my medical decision making (see chart for details).     Patient presents with complaint of worsening left leg weakness and pain consistent with her typical MS flares. She reports having multiple falls at home due to her worsening symptoms. VSS. Exam revealed decreased strength to left lower extremity, remaining neuro exam unremarkable.  Chart review shows patient was last admitted on 12/21/15 for an MS exacerbation. She was given 1 g IV steroids daily along with baclofen, Neurontin, tramadol, Zanaflex and Flomax with improvement of symptoms. She was discharged home on 12/23/15 with outpatient neurology follow-up. Patient was last seen by her neurologist on 08/05/16, given single dose of IV solumedrol in office and d/c home with plan of 2 more doses of IV steroids as needed if sxs continue. Pt reports calling office but has not had appointment scheduled for IV steroids.  Discussed pt with Dr. Roxanne Mins. Plan to check labs, give pt 1g IV solumedrol in the ED  and consult neurology. Dr. Leonel Ramsay advised to admit pt to hospitalist at [facility] for MS flare and reports he will come evaluate the pt. Consulted hospitalist, Dr. Myna Hidalgo agrees to admission. Discussed plan for admission with pt.  Final Clinical Impressions(s) / ED Diagnoses  Final diagnoses:  Multiple sclerosis exacerbation [facility])    New Prescriptions New Prescriptions   No medications on file     Renold Don 38/88/28 0034    Glick, David, MD 91/79/15 [phone]

## 2016-08-13 NOTE — Progress Notes (Signed)
PT Cancellation Note  Patient Details Name: Dawn Peters MRN: [id number] DOB: 1980-11-17   Cancelled Treatment:    Reason Eval/Treat Not Completed: Patient at procedure or test/unavailable (pt leaving room for MRI. Will re-attempt tomorrow. )   Philomena Doheny 08/13/2016, 12:53 PM

## 2016-08-13 NOTE — Progress Notes (Signed)
Pt admitted after midnight, please see earlier admission note by Dr. Myna Hidalgo. Pt admitted with left LE weakness, ? MS flare up, neuro consulted. MRI pending. Keep on Solumedrol.   Faye Ramsay, MD  [facility] Pager [phone]  If 7PM-7AM, please contact night-coverage [email or web address] Password [email or web address]

## 2016-08-13 NOTE — ED Notes (Addendum)
Called floor to give report, nurse was busy in a room. Will call back.

## 2016-08-13 NOTE — Consult Note (Signed)
Neurology Consultation Reason for Consult: Left Sided weakness Referring Physician: Modena Jansky, N  CC: Left Sided weakness  History is obtained from:patient  HPI: Dawn Peters is a 36 y.o. female with a history of MS who presents with 2 weeks progressive left sided weakness. She was started on IV solumedrol by her neurologist, but for unclear reasons she did not receive any doses past her first 1 g dose. She does think she got some benefit from the single dose, but unfortunately continues to progress. She received this on 6/1. Over the past week she has progressively gotten worse with progressive weakness of her left side. She is now falling and dropping things. Due to this progression, she has presented to the emergency department for further evaluation.   ROS: A 14 point ROS was performed and is negative except as noted in the HPI.   Past Medical History:  Diagnosis Date  . Anxiety   . Depression   . Hypertension   . MS (multiple sclerosis) ([place])      Family History  Problem Relation Age of Onset  . Healthy Mother   . Healthy Father   . Breast cancer Unknown        paternal grandmother dx age 2  . Colon cancer Unknown        paternal grandmother  . Hypertension Other   . Diabetes Other      Social History:  reports that she has never smoked. She has never used smokeless tobacco. She reports that she does not drink alcohol or use drugs.   Exam: Current vital signs: BP 115/78 (BP Location: Right Arm)   Pulse 78   Temp 98.2 F (36.8 C) (Oral)   Resp 18   LMP 08/04/2016   SpO2 100%  Vital signs in last 24 hours: Temp:  [98.2 F (36.8 C)] 98.2 F (36.8 C) (06/09 0035) Pulse Rate:  [78] 78 (06/09 0035) Resp:  [18] 18 (06/09 0035) BP: (115)/(78) 115/78 (06/09 0035) SpO2:  [100 %] 100 % (06/09 0035)   Physical Exam  Constitutional: Appears well-developed and well-nourished.  Psych: Affect appropriate to situation Eyes: No scleral injection HENT: No OP  obstrucion Head: Normocephalic.  Cardiovascular: Normal rate and regular rhythm.  Respiratory: Effort normal and breath sounds normal to anterior ascultation GI: Soft.  No distension. There is no tenderness.  Skin: WDI  Neuro: Mental Status: Patient is awake, alert, oriented to person, place, month, year, and situation. Patient is able to give a clear and coherent history. No signs of aphasia or neglect Cranial Nerves: II: Visual Fields are full. Pupils are equal, round, and reactive to light.   III,IV, VI: EOMI without ptosis or diploplia.  V: Facial sensation is symmetric to temperature VII: Facial movement is symmetric.  VIII: hearing is intact to voice X: Uvula elevates symmetrically XI: Shoulder shrug is symmetric. XII: tongue is midline without atrophy or fasciculations.  Motor: Tone is normal. Bulk is normal. She has 5/5 strength in bilateral proximal upper extremities, and right lower extremity. She has 4/5 strength in the left lower extremity she has mild distal weakness in the left upper extremity Sensory: Sensation is symmetric to light touch and temperature in the arms and legs. Cerebellar: FNF and HKS are intact bilaterally  I have reviewed labs in epic and the results pertinent to this consultation are: CBC-unremarkable   Impression: 36 year old female with multiple sclerosis who presents with progressive left-sided weakness. I suspect this represents an MS flare and she will need  to be started on IV Solu-Medrol. To confirm active inflammation, I do think it would be prudent to get MRI imaging.  Recommendations: 1) MRI brain, cervical spine, T-spine with/without contrast 2) Solu-Medrol 1 g daily 3 days 3) PT   Roland Rack, MD [facility] [phone]  If 7pm- 7am, please page neurology on call as listed in [place].

## 2016-08-14 DIAGNOSIS — G35 Multiple sclerosis: Secondary | ICD-10-CM | POA: Diagnosis not present

## 2016-08-14 DIAGNOSIS — R29898 Other symptoms and signs involving the musculoskeletal system: Secondary | ICD-10-CM | POA: Diagnosis not present

## 2016-08-14 LAB — CBC
HCT: 34.5 % — ABNORMAL LOW (ref 36.0–46.0)
Hemoglobin: 11.9 g/dL — ABNORMAL LOW (ref 12.0–15.0)
MCH: 33.1 pg (ref 26.0–34.0)
MCHC: 34.5 g/dL (ref 30.0–36.0)
MCV: 96.1 fL (ref 78.0–100.0)
Platelets: 289 10*3/uL (ref 150–400)
RBC: 3.59 MIL/uL — AB (ref 3.87–5.11)
RDW: 14.2 % (ref 11.5–15.5)
WBC: 15.6 10*3/uL — ABNORMAL HIGH (ref 4.0–10.5)

## 2016-08-14 LAB — BASIC METABOLIC PANEL
Anion gap: 9 (ref 5–15)
BUN: 20 mg/dL (ref 6–20)
CHLORIDE: 104 mmol/L (ref 101–111)
CO2: 25 mmol/L (ref 22–32)
Calcium: 8.7 mg/dL — ABNORMAL LOW (ref 8.9–10.3)
Creatinine, Ser: 1.32 mg/dL — ABNORMAL HIGH (ref 0.44–1.00)
GFR calc Af Amer: 58 mL/min — ABNORMAL LOW (ref >60)
GFR calc non Af Amer: 50 mL/min — ABNORMAL LOW (ref >60)
Glucose, Bld: 115 mg/dL — ABNORMAL HIGH (ref 65–99)
POTASSIUM: 4.1 mmol/L (ref 3.5–5.1)
SODIUM: 138 mmol/L (ref 135–145)

## 2016-08-14 LAB — URINE CULTURE

## 2016-08-14 LAB — GLUCOSE, CAPILLARY: GLUCOSE-CAPILLARY: 131 mg/dL — AB (ref 65–99)

## 2016-08-14 MED ORDER — HYDROCODONE-ACETAMINOPHEN 5-325 MG PO TABS: 1.0000 | ORAL_TABLET | Freq: Three times a day (TID) | ORAL | 0 refills | Status: DC | PRN

## 2016-08-14 NOTE — Care Management Note (Signed)
Case Management Note  Patient Details  Name: Dawn Peters MRN: [id number] Date of Birth: 02-27-1981  Subjective/Objective:    MS exacerbation                Action/Plan: Discharge Planning: NCM spoke to pt at bedside. States she will follow up with her Neurologist to arrange outpt PT. Pt is requesting  RW with seat. Contacted [facility] DME rep and delivery time is greater than an hour. Pt states she will pick up at [place] store. Provided pt with Rx and will fax Rx to [facility] with note pt will pick up at store.    Expected Discharge Date:  08/14/16               Expected Discharge Plan:  Home/Self Care  In-House Referral:  NA  Discharge planning Services  CM Consult  Post Acute Care Choice:  NA Choice offered to:  NA  DME Arranged:  [place] rolling with seat (patient will pick up from [place] store. ) DME Agency:  [place]:  NA [place] Agency:  NA  Status of Service:  Completed, signed off  If discussed at [place] of Stay Meetings, dates discussed:    Additional Comments:  Erenest Rasher, RN 08/14/2016, 2:51 PM

## 2016-08-14 NOTE — Progress Notes (Signed)
PT Note  Patient Details Name: Dawn Peters MRN: [id number] DOB: 23-Jun-1980   :     Noted PT order, chart reviewed. Entered room and pt in bathroom with door closed. Asked if she was okay or needed help, she answered , "no, I am fine just getting ready to take my shower".   Assuming patient is independent at this time, will check back later today to see if any skilled PT needs.    Clide Dales 08/14/2016, 11:37 AM  [phone]

## 2016-08-14 NOTE — Discharge Instructions (Signed)
Multiple Sclerosis °Multiple sclerosis (MS) is a disease of the central nervous system. It leads to the loss of the insulating covering of the nerves (myelin sheath) of your brain. When this happens, brain signals do not get sent properly or may not get sent at all. The age of onset of MS varies. °What are the causes? °The cause of MS is unknown. However, it is more common in the northern [place] than in the southern [place]. °What increases the risk? °There is a higher number of women with MS than men. MS is not an illness that is passed down to you from your family members (inherited). However, your risk of MS is higher if you have a relative with MS. °What are the signs or symptoms? °The symptoms of MS occur in episodes or attacks. These attacks may last weeks to months. There may be long periods of almost no symptoms between attacks. The symptoms of MS vary. This is because of the many different ways it affects the central nervous system. The main symptoms of MS include: °· Vision problems and eye pain. °· Numbness. °· Weakness. °· Inability to move your arms, hands, feet, or legs (paralysis). °· Balance problems. °· Tremors. °How is this diagnosed? °Your health care provider can diagnose MS with the help of imaging exams and lab tests. These may include specialized X-ray exams and spinal fluid tests. The best imaging exam to confirm a diagnosis of MS is an MRI. °How is this treated? °There is no known cure for MS, but there are medicines that can decrease the number and frequency of attacks. Steroids are often used for short-term relief. Physical and occupational therapy may also help. There are also many new alternative or complementary treatments available to help control the symptoms of MS. Ask your health care provider if any of these other options are right for you. °Follow these instructions at home: °· Take medicines as directed by your health care provider. °· Exercise as directed by your  health care provider. °Contact a health care provider if: °You begin to feel depressed. °Get help right away if: °· You develop paralysis. °· You have problems with bladder, bowel, or sexual function. °· You develop mental changes, such as forgetfulness or mood swings. °· You have a period of uncontrolled movements (seizure). °This information is not intended to replace advice given to you by your health care provider. Make sure you discuss any questions you have with your health care provider. °Document Released: 02/19/2000 Document Revised: 07/30/2015 Document Reviewed: 10/29/2012 °Elsevier Interactive Patient Education © 2017 [employer] ° °

## 2016-08-14 NOTE — Care Management CC44 (Signed)
Condition Code 44 Documentation Completed  Patient Details  Name: LUXE CUADROS MRN: [id number] Date of Birth: [birth year]   Condition Code 44 given:  Yes Patient signature on Condition Code 44 notice:  Yes Documentation of 2 MD's agreement:  Yes Code 44 added to claim:  Yes    Erenest Rasher, RN 08/14/2016, 2:45 PM

## 2016-08-14 NOTE — Progress Notes (Signed)
Discharge instructions reviewed with patient, patient verbalized understanding. Patient to be discharged via private vehicle. 

## 2016-08-14 NOTE — Discharge Summary (Signed)
Physician Discharge Summary  Dawn Peters ENI:[id number] DOB: 09/09/1980 DOA: 08/13/2016  PCP: System, Pcp Not In  Admit date: 08/13/2016 Discharge date: 08/14/2016  Recommendations for Outpatient Follow-up:  1. Pt will need to follow up with PCP in 2-3 weeks post discharge 2. Please obtain BMP to evaluate electrolytes and kidney function 3. Please also check CBC to evaluate Hg and Hct levels  Discharge Diagnoses:  Principal Problem:   Left leg weakness Active Problems:   Multiple sclerosis (HCC)   Essential hypertension   Chronic pain   Mild renal insufficiency    Discharge Condition: Stable  Diet recommendation: Heart healthy diet discussed in details   History of present illness:  Pt with known diagnosis of MS, presented to [facility] for evaluation of progressively worsening LE weakness. She has seen neurologist in the office and was given one dose of solumedrol 1000 mg IV and was referred to ED for an admission and continuation of IV infusion.   Hospital Course:  Principal Problem:   Left leg weakness - suspected to be due to MS - today is day #2 os solumedrol but pt has gotten one dose in neurologist office so this completed three doses treatment as recommended by neurology team  - pt wants to go home today, says she feeling better   Active Problems:   Essential hypertension - resume home medical regimen     Mild renal insufficiency - stable so far but needs close outpatient follow up     Procedures/Studies: Mr Jeri Cos Wo Contrast  Result Date: 08/13/2016 CLINICAL DATA:  36 y/o female with chronic multiple sclerosis. Progressed left side weakness. EXAM: MRI HEAD WITHOUT AND WITH CONTRAST MRI CERVICAL SPINE WITHOUT AND WITH CONTRAST MRI THORACIC SPINE WITHOUT AND WITH CONTRAST TECHNIQUE: Multiplanar, multiecho pulse sequences of the brain and surrounding structures, cervical and thoracic spine, were obtained without and with intravenous contrast. CONTRAST:  52mL  MULTIHANCE GADOBENATE DIMEGLUMINE 529 MG/ML IV SOLN COMPARISON:  Brain, cervical, and thoracic MRI 06/13/2015, and earlier. [facility] renal ultrasound 04/09/2010. FINDINGS: MRI HEAD FINDINGS Brain: Stable cerebral volume since 2017. Diffuse indistinct periventricular white matter T2 and FLAIR hyperintensity in the brain is re- demonstrated, and maximal along the bodies, atria, and temporal and occipital horns of both lateral ventricles. The size and configuration of the white matter signal abnormality is stable. No cortical involvement or encephalomalacia is identified. Deep gray matter nuclei, remain spared. Brainstem is relatively spared ; subtle T2 and FLAIR hyperintensity in the dorsal pons near the cerebellar peduncle is is again noted an greater on the right side. The cerebellum otherwise is spared. No lesions with restricted diffusion or postcontrast enhancement. No restricted diffusion to suggest acute infarction. No midline shift, mass effect, evidence of mass lesion, ventriculomegaly, extra-axial collection or acute intracranial hemorrhage. Cervicomedullary junction and pituitary are within normal limits. No abnormal enhancement identified. No dural thickening. Vascular: Major intracranial vascular flow voids are stable. Skull and upper cervical spine: Normal bone marrow signal. Cervical spine findings are below. Sinuses/Orbits: Bilateral orbits soft tissues appear normal. Visualized paranasal sinuses and mastoids are stable and well pneumatized. Other: Visible internal auditory structures appear normal. Negative scalp soft tissues. MRI CERVICAL SPINE FINDINGS Alignment: Stable mild straightening of cervical lordosis. Vertebrae: No marrow edema or evidence of acute osseous abnormality. Visualized bone marrow signal is within normal limits. Cord: Widespread abnormal T2 and STIR hyperintense signal in the cervical spinal cord, most apparent on series 14 and series 16, and appears less heterogeneous than  on the 2017 comparison. Subsequent mild generalized cervical spinal cord volume loss/ atrophy. No abnormal intradural enhancement. No spinal cord expansion. Posterior Fossa, vertebral arteries, paraspinal tissues: Major vascular flow voids in the neck are preserved. Resolved patchy right greater than left perispinal muscle edema since the 2017 comparison. Bilateral paraspinal soft tissues today appear normal. Disc levels: Mild disc desiccation at C3-C4. No other degenerative changes in the cervical spine. MRI THORACIC SPINE FINDINGS Segmentation:  Appears normal. Alignment: Stable thoracic vertebral height and alignment with mildly exaggerated kyphosis. Vertebrae: No marrow edema or evidence of acute osseous abnormality. Visualized bone marrow signal is within normal limits. Cord: Generalized decreased thoracic spinal cord volume similar to that in the cervical spine. Widespread heterogeneous T2 and STIR hyperintensity within the thoracic spinal cord which seems to spare the conus medullaris which is at T12 and L1. The most severe thoracic cord signal heterogeneity is from the T1 to the T7 level, with cord volume loss particularly at the T6 and T7 levels. Overall thoracic spinal cord signal and morphology appears stable since 2017. However, following contrast there is subtle spinal cord hyper enhancement at the T11 level (series 27, image 5). The conus and visible cauda equina appear within normal limits. Paraspinal tissues: Abnormal visible left kidney with moderate to severe left renal collecting system dilatation which appears to be chronic since 2017 but may have progressed. Note that there were no left renal pelvic cysts present on are renal ultrasound from 04/09/2010. Otherwise negative visualized thoracic and upper abdominal viscera. Partially visible subcutaneous edema in the lower thoracic and lumbar spine. Disc levels: No thoracic spine degeneration or spinal stenosis. IMPRESSION: 1. 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 Suspicion of acute demyelination today at the T11 cord level, in the form of subtle spinal cord enhancement. 2. Abnormal appearance of the left kidney with severe left hydronephrosis suspected, and present since April 2017, but is a significant change from the prior renal ultrasound of 04/09/2010. Recommend follow-up noncontrast CT Abdomen and Pelvis. 3. Stable MRI appearance of chronic cerebral demyelinating disease since 2017. No new intracranial abnormality. 4. No significant cervical or thoracic spine degeneration. Electronically Signed   By: Genevie Ann M.D.   On: 08/13/2016 15:15   Mr Cervical Spine W Wo Contrast  Result Date: 08/13/2016 CLINICAL DATA:  36 y/o female with chronic multiple sclerosis. Progressed left side weakness. EXAM: MRI HEAD WITHOUT AND WITH CONTRAST MRI CERVICAL SPINE WITHOUT AND WITH CONTRAST MRI THORACIC SPINE WITHOUT AND WITH CONTRAST TECHNIQUE: Multiplanar, multiecho pulse sequences of the brain and surrounding structures, cervical and thoracic spine, were obtained without and with intravenous contrast. CONTRAST:  71mL MULTIHANCE GADOBENATE DIMEGLUMINE 529 MG/ML IV SOLN COMPARISON:  Brain, cervical, and thoracic MRI 06/13/2015, and earlier. [facility] renal ultrasound 04/09/2010. FINDINGS: MRI HEAD FINDINGS Brain: Stable cerebral volume since 2017. Diffuse indistinct periventricular white matter T2 and FLAIR hyperintensity in the brain is re- demonstrated, and maximal along the bodies, atria, and temporal and occipital horns of both lateral ventricles. The size and configuration of the white matter signal abnormality is stable. No cortical involvement or encephalomalacia is identified. Deep gray matter nuclei, remain spared. Brainstem is relatively spared ; subtle T2 and FLAIR hyperintensity in the dorsal pons near the cerebellar peduncle is is again noted an greater on the right side. The  cerebellum otherwise is spared. No lesions with restricted diffusion or postcontrast enhancement. No restricted diffusion to suggest acute infarction.  No midline shift, mass effect, evidence of mass lesion, ventriculomegaly, extra-axial collection or acute intracranial hemorrhage. Cervicomedullary junction and pituitary are within normal limits. No abnormal enhancement identified. No dural thickening. Vascular: Major intracranial vascular flow voids are stable. Skull and upper cervical spine: Normal bone marrow signal. Cervical spine findings are below. Sinuses/Orbits: Bilateral orbits soft tissues appear normal. Visualized paranasal sinuses and mastoids are stable and well pneumatized. Other: Visible internal auditory structures appear normal. Negative scalp soft tissues. MRI CERVICAL SPINE FINDINGS Alignment: Stable mild straightening of cervical lordosis. Vertebrae: No marrow edema or evidence of acute osseous abnormality. Visualized bone marrow signal is within normal limits. Cord: Widespread abnormal T2 and STIR hyperintense signal in the cervical spinal cord, most apparent on series 14 and series 16, and appears less heterogeneous than on the 2017 comparison. Subsequent mild generalized cervical spinal cord volume loss/ atrophy. No abnormal intradural enhancement. No spinal cord expansion. Posterior Fossa, vertebral arteries, paraspinal tissues: Major vascular flow voids in the neck are preserved. Resolved patchy right greater than left perispinal muscle edema since the 2017 comparison. Bilateral paraspinal soft tissues today appear normal. Disc levels: Mild disc desiccation at C3-C4. No other degenerative changes in the cervical spine. MRI THORACIC SPINE FINDINGS Segmentation:  Appears normal. Alignment: Stable thoracic vertebral height and alignment with mildly exaggerated kyphosis. Vertebrae: No marrow edema or evidence of acute osseous abnormality. Visualized bone marrow signal is within normal limits.  Cord: Generalized decreased thoracic spinal cord volume similar to that in the cervical spine. Widespread heterogeneous T2 and STIR hyperintensity within the thoracic spinal cord which seems to spare the conus medullaris which is at T12 and L1. The most severe thoracic cord signal heterogeneity is from the T1 to the T7 level, with cord volume loss particularly at the T6 and T7 levels. Overall thoracic spinal cord signal and morphology appears stable since 2017. However, following contrast there is subtle spinal cord hyper enhancement at the T11 level (series 27, image 5). The conus and visible cauda equina appear within normal limits. Paraspinal tissues: Abnormal visible left kidney with moderate to severe left renal collecting system dilatation which appears to be chronic since 2017 but may have progressed. Note that there were no left renal pelvic cysts present on are renal ultrasound from 04/09/2010. Otherwise negative visualized thoracic and upper abdominal viscera. Partially visible subcutaneous edema in the lower thoracic and lumbar spine. Disc levels: No thoracic spine degeneration or spinal stenosis. IMPRESSION: 1. Severe chronic demyelinating disease in the cervical and much of the thoracic spinal cord with cord atrophy. Suspicion of acute demyelination today at the T11 cord level, in the form of subtle spinal cord enhancement. 2. Abnormal appearance of the left kidney with severe left hydronephrosis suspected, and present since April 2017, but is a significant change from the prior renal ultrasound of 04/09/2010. Recommend follow-up noncontrast CT Abdomen and Pelvis. 3. Stable MRI appearance of chronic cerebral demyelinating disease since 2017. No new intracranial abnormality. 4. No significant cervical or thoracic spine degeneration. Electronically Signed   By: Genevie Ann M.D.   On: 08/13/2016 15:15   Mr Thoracic Spine W Wo Contrast  Result Date: 08/13/2016 CLINICAL DATA:  36 y/o female with chronic  multiple sclerosis. Progressed left side weakness. EXAM: MRI HEAD WITHOUT AND WITH CONTRAST MRI CERVICAL SPINE WITHOUT AND WITH CONTRAST MRI THORACIC SPINE WITHOUT AND WITH CONTRAST TECHNIQUE: Multiplanar, multiecho pulse sequences of the brain and surrounding structures, cervical and thoracic spine, were obtained without and with intravenous contrast. CONTRAST:  32mL MULTIHANCE GADOBENATE DIMEGLUMINE 529 MG/ML IV SOLN COMPARISON:  Brain, cervical, and thoracic MRI 06/13/2015, and earlier. [facility] renal ultrasound 04/09/2010. FINDINGS: MRI HEAD FINDINGS Brain: Stable cerebral volume since 2017. Diffuse indistinct periventricular white matter T2 and FLAIR hyperintensity in the brain is re- demonstrated, and maximal along the bodies, atria, and temporal and occipital horns of both lateral ventricles. The size and configuration of the white matter signal abnormality is stable. No cortical involvement or encephalomalacia is identified. Deep gray matter nuclei, remain spared. Brainstem is relatively spared ; subtle T2 and FLAIR hyperintensity in the dorsal pons near the cerebellar peduncle is is again noted an greater on the right side. The cerebellum otherwise is spared. No lesions with restricted diffusion or postcontrast enhancement. No restricted diffusion to suggest acute infarction. No midline shift, mass effect, evidence of mass lesion, ventriculomegaly, extra-axial collection or acute intracranial hemorrhage. Cervicomedullary junction and pituitary are within normal limits. No abnormal enhancement identified. No dural thickening. Vascular: Major intracranial vascular flow voids are stable. Skull and upper cervical spine: Normal bone marrow signal. Cervical spine findings are below. Sinuses/Orbits: Bilateral orbits soft tissues appear normal. Visualized paranasal sinuses and mastoids are stable and well pneumatized. Other: Visible internal auditory structures appear normal. Negative scalp soft tissues. MRI  CERVICAL SPINE FINDINGS Alignment: Stable mild straightening of cervical lordosis. Vertebrae: No marrow edema or evidence of acute osseous abnormality. Visualized bone marrow signal is within normal limits. Cord: Widespread abnormal T2 and STIR hyperintense signal in the cervical spinal cord, most apparent on series 14 and series 16, and appears less heterogeneous than on the 2017 comparison. Subsequent mild generalized cervical spinal cord volume loss/ atrophy. No abnormal intradural enhancement. No spinal cord expansion. Posterior Fossa, vertebral arteries, paraspinal tissues: Major vascular flow voids in the neck are preserved. Resolved patchy right greater than left perispinal muscle edema since the 2017 comparison. Bilateral paraspinal soft tissues today appear normal. Disc levels: Mild disc desiccation at C3-C4. No other degenerative changes in the cervical spine. MRI THORACIC SPINE FINDINGS Segmentation:  Appears normal. Alignment: Stable thoracic vertebral height and alignment with mildly exaggerated kyphosis. Vertebrae: No marrow edema or evidence of acute osseous abnormality. Visualized bone marrow signal is within normal limits. Cord: Generalized decreased thoracic spinal cord volume similar to that in the cervical spine. Widespread heterogeneous T2 and STIR hyperintensity within the thoracic spinal cord which seems to spare the conus medullaris which is at T12 and L1. The most severe thoracic cord signal heterogeneity is from the T1 to the T7 level, with cord volume loss particularly at the T6 and T7 levels. Overall thoracic spinal cord signal and morphology appears stable since 2017. However, following contrast there is subtle spinal cord hyper enhancement at the T11 level (series 27, image 5). The conus and visible cauda equina appear within normal limits. Paraspinal tissues: Abnormal visible left kidney with moderate to severe left renal collecting system dilatation which appears to be chronic since  2017 but may have progressed. Note that there were no left renal pelvic cysts present on are renal ultrasound from 04/09/2010. Otherwise negative visualized thoracic and upper abdominal viscera. Partially visible subcutaneous edema in the lower thoracic and lumbar spine. Disc levels: No thoracic spine degeneration or spinal stenosis. IMPRESSION: 1. Severe chronic demyelinating disease in the cervical and much of the thoracic spinal cord with cord atrophy. Suspicion of acute demyelination today at the T11 cord level, in the form of subtle spinal cord enhancement. 2. Abnormal appearance of the left kidney with  severe left hydronephrosis suspected, and present since April 2017, but is a significant change from the prior renal ultrasound of 04/09/2010. Recommend follow-up noncontrast CT Abdomen and Pelvis. 3. Stable MRI appearance of chronic cerebral demyelinating disease since 2017. No new intracranial abnormality. 4. No significant cervical or thoracic spine degeneration. Electronically Signed   By: Genevie Ann M.D.   On: 08/13/2016 15:15     Discharge Exam: Vitals:   08/13/16 2232 08/14/16 0426  BP: (!) 137/95 137/80  Pulse: 91 93  Resp: 20 18  Temp: 98.5 F (36.9 C) 98.4 F (36.9 C)   Vitals:   08/13/16 0533 08/13/16 1600 08/13/16 2232 08/14/16 0426  BP: 117/79 140/90 (!) 137/95 137/80  Pulse: 93 100 91 93  Resp: 20 20 20 18   Temp: 98.3 F (36.8 C) 99 F (37.2 C) 98.5 F (36.9 C) 98.4 F (36.9 C)  TempSrc: Oral Oral Oral Oral  SpO2: 100% 98% 99% 100%  Weight: 91.2 kg (201 lb 1 oz)     Height: 5\' 5"  (1.651 m)       General: Pt is alert, follows commands appropriately, not in acute distress Cardiovascular: Regular rate and rhythm, S1/S2 +, no murmurs, no rubs, no gallops Respiratory: Clear to auscultation bilaterally, no wheezing, no crackles, no rhonchi Abdominal: Soft, non tender, non distended, bowel sounds +, no guarding Extremities: no edema, no cyanosis, pulses palpable bilaterally  DP and PT Neuro: Grossly nonfocal  Discharge Instructions   Allergies as of 08/14/2016   No Known Allergies     Medication List    TAKE these medications   amitriptyline 25 MG tablet Commonly known as:  ELAVIL Take one or two at bedtime What changed:  how much to take  how to take this  when to take this  reasons to take this  additional instructions   baclofen 20 MG tablet Commonly known as:  LIORESAL Take 1 tablet (20 mg total) by mouth 3 (three) times daily as needed (for ms). What changed:  reasons to take this   busPIRone 15 MG tablet Commonly known as:  BUSPAR Take 1 tablet (15 mg total) by mouth 2 (two) times daily.   citalopram 40 MG tablet Commonly known as:  CELEXA Take 1 tablet (40 mg total) by mouth daily.   dalfampridine 10 MG Tb12 Take 1 tablet (10 mg total) by mouth 2 (two) times daily.   gabapentin 800 MG tablet Commonly known as:  NEURONTIN Take 1 tablet (800 mg total) by mouth 3 (three) times daily.   HYDROcodone-acetaminophen 5-325 MG tablet Commonly known as:  NORCO/VICODIN Take 1 tablet by mouth 3 (three) times daily as needed for moderate pain.   lamoTRIgine 200 MG tablet Commonly known as:  LAMICTAL Take 1 tablet (200 mg total) by mouth 2 (two) times daily.   LEMTRADA 12 MG/1.2ML Soln Generic drug:  Alemtuzumab Inject 12 mg into the vein See admin instructions. Pt receives every 3 years.   lisinopril 20 MG tablet Commonly known as:  PRINIVIL,ZESTRIL TAKE 1 TABLET BY MOUTH EVERY DAY   naproxen 375 MG tablet Commonly known as:  NAPROSYN Take 1 tablet (375 mg total) by mouth 2 (two) times daily. What changed:  when to take this  reasons to take this   Oxcarbazepine 300 MG tablet Commonly known as:  TRILEPTAL Take 1 tablet (300 mg total) by mouth 2 (two) times daily.   SUMAtriptan 100 MG tablet Commonly known as:  IMITREX Take 1 tablet (100 mg total) by mouth  once as needed for migraine. May repeat in 2 hours if headache  persists or recurs.   tamsulosin 0.4 MG Caps capsule Commonly known as:  FLOMAX Take 0.4 mg by mouth daily.   tiZANidine 4 MG tablet Commonly known as:  ZANAFLEX Take 1 tablet (4 mg total) by mouth at bedtime.   traMADol 300 MG 24 hr tablet Commonly known as:  ULTRAM-ER Take 1 tablet (300 mg total) by mouth daily. What changed:  when to take this  reasons to take this   TRI-LO-MARZIA 0.18/0.215/0.25 MG-25 MCG tab Generic drug:  Norgestimate-Ethinyl Estradiol Triphasic TAKE 1 TABLET BY MOUTH DAILY.   valACYclovir 500 MG tablet Commonly known as:  VALTREX Take 1 tablet (500 mg total) by mouth 2 (two) times daily. What changed:  when to take this         The results of significant diagnostics from this hospitalization (including imaging, microbiology, ancillary and laboratory) are listed below for reference.     Microbiology: Recent Results (from the past 240 hour(s))  Urine culture     Status: Abnormal   Collection Time: 08/13/16  2:50 AM  Result Value Ref Range Status   Specimen Description URINE, CLEAN CATCH  Final   Special Requests NONE  Final   Culture MULTIPLE SPECIES PRESENT, SUGGEST RECOLLECTION (A)  Final   Report Status 08/14/2016 FINAL  Final     Labs: Basic Metabolic Panel:  Recent Labs Lab 08/13/16 0232 08/14/16 0417  NA 138 138  K 3.7 4.1  CL 102 104  CO2 28 25  GLUCOSE 95 115*  BUN 22* 20  CREATININE 1.34* 1.32*  CALCIUM 9.2 8.7*   Liver Function Tests:  Recent Labs Lab 08/13/16 0232  AST 24  ALT 21  ALKPHOS 96  BILITOT 0.4  PROT 7.3  ALBUMIN 3.9   CBC:  Recent Labs Lab 08/13/16 0232 08/14/16 0417  WBC 6.6 15.6*  NEUTROABS 5.2  --   HGB 13.5 11.9*  HCT 38.6 34.5*  MCV 95.5 96.1  PLT 258 289   CBG:  Recent Labs Lab 08/13/16 0808 08/14/16 0751  GLUCAP 178* 131*   SIGNED: Time coordinating discharge:  30 minutes  Faye Ramsay, MD  [facility] 08/14/2016, 11:16 AM Pager [phone]  If 7PM-7AM,  please contact night-coverage [email or web address] Password [email or web address]

## 2016-08-14 NOTE — Evaluation (Signed)
Physical Therapy Evaluation Patient Details Name: Dawn Peters MRN: [id number] DOB: 30-May-1980 Today's Date: 08/14/2016   History of Present Illness  Pt with h/o of MS and admit with c/o increased weakness  and experienced a fall the day before admission .   Clinical Impression  Pt with notable weakness in LLE (hip flexors, knee extension and ankle DF making gait unsteady at this time. PT stated it had already begun to improve a little and she feels she will continue to see some improvement however still her LLE has been the challenge lately. Pt ready to learn more about what exercise program she can continue with to make her stronger and more steady on her feet. We began with some education and gave her some exercises however really recommend her continue with OPPT .       Follow Up Recommendations Outpatient PT    Equipment Recommendations  None recommended by PT    Recommendations for Other Services       Precautions / Restrictions        Mobility  Bed Mobility Overal bed mobility: Independent                Transfers Overall transfer level: Independent                  Ambulation/Gait Ambulation/Gait assistance: Min assist Ambulation Distance (Feet): 100 Feet Assistive device: 1 person hand held assist Gait Pattern/deviations: Step-through pattern;Decreased step length - left;Decreased stance time - right;Ataxic Gait velocity: slow   General Gait Details: difficulty with swing phase on LLE due to weakness in entrie left side (hip flexion, with knee flexion and DF) and decreased stance time on Left due to weakness well. Some stubble with MonA and emphasized pt to have husband with her at all times for next few days.   Stairs Stairs:  (educated verbally on stepping up with Right LE only for steps and lowering LLE down first when decending. Pt verbally understood. )          Wheelchair Mobility    Modified Rankin (Stroke Patients Only)        Balance Overall balance assessment: History of Falls;Needs assistance Sitting-balance support: Feet supported Sitting balance-Leahy Scale: Normal     Standing balance support: Single extremity supported;During functional activity Standing balance-Leahy Scale: Fair                               Pertinent Vitals/Pain Pain Assessment: No/denies pain    Home Living Family/patient expects to be discharged to:: Private residence Living Arrangements: Spouse/significant other;Children Available Help at Discharge: Family Type of Home: House Home Access: Stairs to enter Entrance Stairs-Rails: Right Entrance Stairs-Number of Steps: 2 Home Layout: One level Home Equipment: None      Prior Function Level of Independence: Independent         Comments:  h/o B THA, usually has falls when she has flair ups     Hand Dominance        Extremity/Trunk Assessment        Lower Extremity Assessment Lower Extremity Assessment: Generalized weakness;LLE deficits/detail LLE Deficits / Details: greater weakness on LLE than right with 2/5 for DF, hip flexion and knee ext at 3/5. Difficult on L LE to perform isolated movement patterns as well, most noticable with gait pattern and swing phase causing the L LE to scuff floor with swing phase.  LLE Coordination: decreased fine motor;decreased gross  motor       Communication   Communication: No difficulties  Cognition Arousal/Alertness: Awake/alert Behavior During Therapy: WFL for tasks assessed/performed Overall Cognitive Status: Within Functional Limits for tasks assessed                                        General Comments      Exercises Other Exercises Other Exercises: educated pt with general UB exercises and provided TB (pt has performed these int eh past). Also educated with LE exercises and handouts given focus on ankle, quad and hip in sitting and supine. at this time.    Assessment/Plan     PT Assessment All further PT needs can be met in the next venue of care (recommending OTPT to help contiue to educate and work with LLE strengthening, coordination, and gait starategies. )  PT Problem List Decreased strength;Decreased activity tolerance;Decreased mobility       PT Treatment Interventions      PT Goals (Current goals can be found in the Care Plan section)  Acute Rehab PT Goals PT Goal Formulation: All assessment and education complete, DC therapy    Frequency     Barriers to discharge        Co-evaluation               AM-PAC PT "6 Clicks" Daily Activity  Outcome Measure Difficulty turning over in bed (including adjusting bedclothes, sheets and blankets)?: None Difficulty moving from lying on back to sitting on the side of the bed? : None Difficulty sitting down on and standing up from a chair with arms (e.g., wheelchair, bedside commode, etc,.)?: None Help needed moving to and from a bed to chair (including a wheelchair)?: None Help needed walking in hospital room?: A Little Help needed climbing 3-5 steps with a railing? : A Little 6 Click Score: 22    End of Session   Activity Tolerance: Patient tolerated treatment well Patient left: in bed Nurse Communication: Mobility status PT Visit Diagnosis: Unsteadiness on feet (R26.81);Muscle weakness (generalized) (M62.81)    Time: 0940-7680 PT Time Calculation (min) (ACUTE ONLY): 32 min   Charges:   PT Evaluation $PT Eval Low Complexity: 1 Procedure PT Treatments $Therapeutic Exercise: 8-22 mins   PT G Codes:   PT G-Codes **NOT FOR INPATIENT CLASS** Functional Assessment Tool Used: AM-PAC 6 Clicks Basic Mobility;Clinical judgement Functional Limitation: Mobility: Walking and moving around Mobility: Walking and Moving Around Current Status (S8110): At least 1 percent but less than 20 percent impaired, limited or restricted Mobility: Walking and Moving Around Goal Status [phone]): At least 1 percent  but less than 20 percent impaired, limited or restricted Mobility: Walking and Moving Around Discharge Status [phone]): At least 1 percent but less than 20 percent impaired, limited or restricted    Clide Dales, PT Pager: [phone] 08/14/2016   Emme Rosenau, Gatha Mayer 08/14/2016, 1:46 PM

## 2016-08-14 NOTE — Care Management Obs Status (Signed)
[place] NOTIFICATION   Patient Details  Name: Dawn Peters MRN: [id number] Date of Birth: November 14, 1980   Medicare Observation Status Notification Given:  Yes    Erenest Rasher, RN 08/14/2016, 2:45 PM

## 2016-08-18 ENCOUNTER — Telehealth: Payer: Self-pay | Admitting: Neurology

## 2016-08-18 ENCOUNTER — Encounter: Payer: Self-pay | Admitting: *Deleted

## 2016-08-18 NOTE — Telephone Encounter (Signed)
Hey Faith I see that you have already typed the letter pt calling back in to make sure that it is ready for her to pick up by tomorrow because the letter is needed no later than then. Can you please call when it has been signed off on by Dr Felecia Shelling

## 2016-08-18 NOTE — Telephone Encounter (Signed)
Patient called office in reference to being at [facility] last Friday and completed MRI's.  Patient is not sure if they did all MRI's of the Brain, C-spine, and T-spine if so she would like to cancel the ones she has scheduled here.  Please call

## 2016-08-18 NOTE — Telephone Encounter (Signed)
Patient called office requesting a letter stating what her condition is and that she can not be in the heat and will need to remain cool with [facility] in home.  Patient is working on getting assistance with her power bill.

## 2016-08-18 NOTE — Telephone Encounter (Signed)
I have spoken with Dawn Peters this afternoon and advised that as she had MRI's of brain, cervical and thoracic spine at the  hospital on 08/13/16, she may cancel her pending appt's for MRI's.  She verbalized understanding of same/fim

## 2016-08-18 NOTE — Telephone Encounter (Signed)
I have spoken with Dawn Peters and advised letter is up front [facility], ready to be picked up/fim

## 2016-08-22 ENCOUNTER — Other Ambulatory Visit: Payer: Medicare Other

## 2016-08-22 ENCOUNTER — Encounter: Payer: Self-pay | Admitting: Neurology

## 2016-08-23 ENCOUNTER — Other Ambulatory Visit: Payer: Self-pay | Admitting: Neurology

## 2016-09-06 ENCOUNTER — Telehealth: Payer: Self-pay | Admitting: Neurology

## 2016-09-06 MED ORDER — GABAPENTIN 800 MG PO TABS: 800.0000 mg | ORAL_TABLET | Freq: Three times a day (TID) | ORAL | 11 refills | Status: DC

## 2016-09-06 NOTE — Telephone Encounter (Signed)
Patient requesting refill of gabapentin (NEURONTIN) 800 MG tablet called to [facility] [address].

## 2016-09-06 NOTE — Addendum Note (Signed)
Addended by: France Ravens I on: 09/06/2016 02:37 PM   Modules accepted: Orders

## 2016-09-06 NOTE — Telephone Encounter (Signed)
Per [name], [name] navigator, Dawn Peters's 2nd yr. [facility] infusions will be coordinated thru [name], Pharmacist at [facility]. I attempted to reach [facility]; received message that she is out of the office until 09/08/16.  I lmom (identified vm) for her to call me back/fim

## 2016-09-06 NOTE — Telephone Encounter (Signed)
[facility]rib[facility]/fim

## 2016-09-08 ENCOUNTER — Telehealth: Payer: Self-pay | Admitting: Neurology

## 2016-09-08 NOTE — Telephone Encounter (Signed)
LMTC./fim 

## 2016-09-08 NOTE — Telephone Encounter (Signed)
Dawn Peters with [facility] called office returning RN's call.  Please call [phone].

## 2016-09-09 NOTE — Telephone Encounter (Signed)
I have spoken with Dawn Peters this morning, and faxed signatory auth form, baseline lab form, and rx. order form to her and to [place] (Dr. Donata Duff assistant), to fax #'s [phone] and fax# [phone]

## 2016-09-19 ENCOUNTER — Telehealth: Payer: Self-pay | Admitting: Neurology

## 2016-09-19 NOTE — Telephone Encounter (Signed)
I have spoken with Dawn Peters this afternoon and advised letter will be placed up front [facility] today.  Letter awaiting RAS sig/fim

## 2016-09-19 NOTE — Telephone Encounter (Signed)
Letter up front [facility]/fim 

## 2016-09-19 NOTE — Telephone Encounter (Signed)
Patient called office requesting another copy of the letter we did for her for MS and not being able to be in the heat, she has misplaced it.  Please call.

## 2016-09-22 ENCOUNTER — Telehealth: Payer: Self-pay | Admitting: Neurology

## 2016-09-22 ENCOUNTER — Encounter: Payer: Self-pay | Admitting: Neurology

## 2016-09-22 NOTE — Telephone Encounter (Signed)
New form completed and faxed to [place] [employer].  I have reached out ot both [place] and Junie Panning to let them know this has been done/fim

## 2016-09-22 NOTE — Telephone Encounter (Signed)
Tanda Rockers with [employer] called office requesting a form be completed by noon today for patient going to [facility] to be infused on Monday.  Please call

## 2016-09-26 ENCOUNTER — Other Ambulatory Visit: Payer: Self-pay | Admitting: *Deleted

## 2016-09-26 ENCOUNTER — Telehealth: Payer: Self-pay | Admitting: *Deleted

## 2016-09-26 DIAGNOSIS — Z79899 Other long term (current) drug therapy: Secondary | ICD-10-CM

## 2016-09-26 DIAGNOSIS — G35 Multiple sclerosis: Secondary | ICD-10-CM

## 2016-09-26 NOTE — Telephone Encounter (Signed)
Per RAS, platelets have dropped since last month [name] labs).  Although still in an ok range, since there is a large drop from last month, need to recheck CBC.  I have spoken with Dawn Peters.  She is agreeable and will try to come in this afternoon.  Order in EPIC/fim

## 2016-09-28 ENCOUNTER — Encounter: Payer: Self-pay | Admitting: *Deleted

## 2016-09-28 ENCOUNTER — Other Ambulatory Visit (INDEPENDENT_AMBULATORY_CARE_PROVIDER_SITE_OTHER): Payer: Self-pay

## 2016-09-28 ENCOUNTER — Telehealth: Payer: Self-pay | Admitting: Neurology

## 2016-09-28 DIAGNOSIS — Z79899 Other long term (current) drug therapy: Secondary | ICD-10-CM

## 2016-09-28 DIAGNOSIS — Z0289 Encounter for other administrative examinations: Secondary | ICD-10-CM

## 2016-09-28 DIAGNOSIS — G35 Multiple sclerosis: Secondary | ICD-10-CM

## 2016-09-28 NOTE — Telephone Encounter (Signed)
VM is full.  If pt. calls back, please let her know that I have placed a letter at the front desk for her to pick up.  Thanks!/fim

## 2016-09-28 NOTE — Telephone Encounter (Signed)
Patient called office requesting a print out of her diagnosis for the [place].  Please call

## 2016-09-29 LAB — CBC WITH DIFFERENTIAL/PLATELET
BASOS ABS: 0 10*3/uL (ref 0.0–0.2)
Basos: 0 %
EOS (ABSOLUTE): 0.1 10*3/uL (ref 0.0–0.4)
Eos: 2 %
Hematocrit: 36.6 % (ref 34.0–46.6)
Hemoglobin: 12.3 g/dL (ref 11.1–15.9)
IMMATURE GRANS (ABS): 0 10*3/uL (ref 0.0–0.1)
Immature Granulocytes: 1 %
LYMPHS: 17 %
Lymphocytes Absolute: 0.7 10*3/uL (ref 0.7–3.1)
MCH: 32.3 pg (ref 26.6–33.0)
MCHC: 33.6 g/dL (ref 31.5–35.7)
MCV: 96 fL (ref 79–97)
MONOCYTES: 11 %
Monocytes Absolute: 0.5 10*3/uL (ref 0.1–0.9)
NEUTROS ABS: 3.1 10*3/uL (ref 1.4–7.0)
Neutrophils: 69 %
Platelets: 269 10*3/uL (ref 150–379)
RBC: 3.81 x10E6/uL (ref 3.77–5.28)
RDW: 15.1 % (ref 12.3–15.4)
WBC: 4.4 10*3/uL (ref 3.4–10.8)

## 2016-09-30 ENCOUNTER — Telehealth: Payer: Self-pay | Admitting: Neurology

## 2016-09-30 NOTE — Telephone Encounter (Signed)
I have spoken with Dawn Peters this morning and advised CBC from 2 days ago is nml.  Platelets nml. RAS will be back in the office on Monday and will review her labs.  She is sched. for [place] 2nd yr. infusions on 10/04/16.  If these dates need to be changed, or if more lab work needs to be done, will call pt. back. She verbalized understanding of same/fim

## 2016-09-30 NOTE — Telephone Encounter (Signed)
Pt called the office request lab results

## 2016-10-03 ENCOUNTER — Ambulatory Visit: Payer: Medicare Other | Admitting: Neurology

## 2016-10-03 ENCOUNTER — Ambulatory Visit: Payer: Self-pay | Admitting: Neurology

## 2016-10-03 NOTE — Telephone Encounter (Signed)
LMOM (identified vm) that per RAS, ok for Lemtrada this week.  She does not need to return this call unless she has questions/fim

## 2016-10-13 ENCOUNTER — Telehealth: Payer: Self-pay | Admitting: *Deleted

## 2016-10-13 NOTE — Telephone Encounter (Signed)
Rn call patient wanting to discuss adderall  Medication. PT was told by infusion nurse it helps with increase energy. Pt schedule for appt.

## 2016-10-17 ENCOUNTER — Other Ambulatory Visit: Payer: Self-pay | Admitting: Neurology

## 2016-10-17 ENCOUNTER — Telehealth: Payer: Self-pay | Admitting: Neurology

## 2016-10-17 NOTE — Telephone Encounter (Signed)
Patient calling regarding a form she left with [name] last Thursday who was to give to you. I advised patient you were not in the office last week.  Please call and discuss.

## 2016-10-17 NOTE — Telephone Encounter (Signed)
I spoke with [name] and do have the form [name] left.  I have spoken with [name] and advised I will complete it as soon as possible and let her know when it is ready/fim

## 2016-10-19 ENCOUNTER — Telehealth: Payer: Self-pay | Admitting: Neurology

## 2016-10-19 NOTE — Telephone Encounter (Signed)
I have spoken with Dossie this afternoon and explained that RAS is ooo this wk. W/I doc is not able to send oral steroids in without seeing her.  Niza verbalized understanding of same.  If sx. worsen, she can call for appt. or go to the ER.  She has a pending appt. with RAS on 10/24/16/fim

## 2016-10-19 NOTE — Telephone Encounter (Signed)
Patient calling statng since Lemtrada treatment last week she has had trouble walking. She would like [facility] [address]. I advised Dr. Felecia Shelling is out of the office but will send message to his nurse.A returned call is not needed if Rx is called in.

## 2016-10-24 ENCOUNTER — Encounter: Payer: Self-pay | Admitting: Neurology

## 2016-10-24 ENCOUNTER — Telehealth: Payer: Self-pay | Admitting: *Deleted

## 2016-10-24 ENCOUNTER — Other Ambulatory Visit: Payer: Self-pay | Admitting: Neurology

## 2016-10-24 ENCOUNTER — Ambulatory Visit (INDEPENDENT_AMBULATORY_CARE_PROVIDER_SITE_OTHER): Payer: Medicare Other | Admitting: Neurology

## 2016-10-24 VITALS — BP 130/80 | Resp 16 | Ht 65.0 in | Wt 194.5 lb

## 2016-10-24 DIAGNOSIS — R29898 Other symptoms and signs involving the musculoskeletal system: Secondary | ICD-10-CM | POA: Diagnosis not present

## 2016-10-24 DIAGNOSIS — G35 Multiple sclerosis: Secondary | ICD-10-CM

## 2016-10-24 DIAGNOSIS — R200 Anesthesia of skin: Secondary | ICD-10-CM | POA: Diagnosis not present

## 2016-10-24 DIAGNOSIS — R269 Unspecified abnormalities of gait and mobility: Secondary | ICD-10-CM | POA: Diagnosis not present

## 2016-10-24 DIAGNOSIS — N1330 Unspecified hydronephrosis: Secondary | ICD-10-CM

## 2016-10-24 DIAGNOSIS — R3911 Hesitancy of micturition: Secondary | ICD-10-CM

## 2016-10-24 DIAGNOSIS — G8929 Other chronic pain: Secondary | ICD-10-CM | POA: Diagnosis not present

## 2016-10-24 DIAGNOSIS — R5383 Other fatigue: Secondary | ICD-10-CM

## 2016-10-24 DIAGNOSIS — R41840 Attention and concentration deficit: Secondary | ICD-10-CM

## 2016-10-24 MED ORDER — AMPHETAMINE-DEXTROAMPHET ER 20 MG PO CP24: 20.0000 mg | ORAL_CAPSULE | Freq: Every day | ORAL | 0 refills | Status: DC

## 2016-10-24 NOTE — Telephone Encounter (Signed)
Approx. 3pm today, pt. was leaving our office, after having seen Dr. Felecia Shelling.  I was notified by the front desk that another visitor in our office reported that someone had fallen in the lobby. Noberto Retort, RN and I responded to the lobby, where we found pt. sitting on a bench. I asked if she had fallen.  She stated she tripped over the doormat. I asked if she was hurt--it she had hit her head, other body part.  She responded no. I asked if she wanted to come back in and let Dr. Felecia Shelling examine her for injury.  She declined, stating "I'm fine."  [name] asked about her knees/extremities, but again pt. stated "i'm fine."  Mult. times we offered assistance, physician exam for injury, but pt. declined each time, stating she was fine, not hurt. There was no obvious visible injury, and pt. was oriented times 4. Around 3:30pm, I was notified by the front desk that pt's mother notified them that she was going to take pt. to the ER for exam./fim

## 2016-10-24 NOTE — Progress Notes (Signed)
[facility]  PATIENT: Dawn Peters DOB: Jul 05, 1980  REFERRING DOCTOR OR PCP:  Dr. Annitta Needs SOURCE: Patient, records in EMR, lab results and radiology reports in EMR, MRI images reviewed on PACS.  _________________________________   HISTORICAL  CHIEF COMPLAINT:  Chief Complaint  Patient presents with  . Multiple Sclerosis    Lemtrada 2nd yr. infusions were August 7,8,9 of 2018.  Sts. she feels she is doing ok.  Walking is some off--she's trying to get used to left AFO/fim    HISTORY OF PRESENT ILLNESS:   Dawn Peters is a 36 year-old woman who was diagnosed with MS in 2000.     MS:   She had her 3 day course of Lemtrada in the office a couple weeks ago and thee infusions went well. . She is JCV high titer positive and had switched from Tysabri to [place] earlier.     Gait/strength:    Gait has worsened due mostly to the left leg weakness and spasticity over the last year. She got her left AFO and is starting to get used to it.   She has trouble lifting the left leg/foot.  Ba;lance is off.     She is on Ampyra with some benefit.  Arms are doing ok.  She has left sided spasticity, only partially helped by baclofen and tizanidine.  Dysesthesia:   Patient continues to report painful left leg dysesthesias. These are similar to the last visit.  .  She is currently on gabapentin 800 mg by mouth 4 times a day and lamotrigine 200 mg twice a day for the dysesthetic pain.  With her medications and tramadol she feels that the pain is tolerable.   Vision:    She denies any MS related visual problems.  Bladder:   Urinary hesitancy continues to be a problem. She gets some benefit from tamsulosin. MRI of the thoracic spine with recent hospitalization shows hydronephrosis on the left.  She has not yet been referred to urology.  Bowel is fine..  Fatigue/sleep:   She is noting more trouble with her fatigue, no occurring daily.  . She does especially poorly in the hear.   She denies sleepiness -- issues are more fatigue.   She has never tried a stimulant.   She sleeps well most nights.  Mood:  Her depression and anxiety is better with citalopram 40 mg and buspar bid.    Anxiety is more of a problem than depression and she feels that the medications are not completely controlling this.  Cognition:   She denies significant difficulties with her cognitive abilities.   Focus is mildly reduced.  MS history:   She was diagnosed with MS in 2000 after presenting with gait difficulties, numbness and headaches. An MRI of the brain was consistent with MS. She was then started on Betaseron. She had difficulty tolerating Betaseron and at some point switched to Novantrone. She took Novantrone every 3 months for a year or so. A little later, she was placed on Tysabri and she stayed on it for a couple of years. However, she was JCV-positive and switched off. She did well on Tysabri. She had been on Tecfidera since 2015. Her MRI from June 2015 showed that there had been no changes compared to an MRI from 2014.   However, she had an exacerbation March 2017.   The 08/07/2013 MRI of the brain shows multiple T2/FLAIR hyperintense foci located in the cerebellum, middle cerebellar peduncles, pons and in the periventricular white  matter of the hemispheres in a pattern and configuration consistent with chronic demyelinating plaque associated with MS. There were no acute findings. There was no change compared to the previous MRI from 01/16/2013 that was also reviewed. An MRI of the cervical spine shows subtle T2 hyperintense signal within the cord and there were no acute findings.  REVIEW OF SYSTEMS: Constitutional: No fevers, chills, sweats, or change in appetite.   Notes fatigue, some insomnia Eyes: No visual changes, double vision, eye pain Ear, nose and throat: No hearing loss, ear pain, nasal congestion, sore throat Cardiovascular: No chest pain, palpitations Respiratory: No shortness of  breath at rest or with exertion.   No wheezes GastrointestinaI: No nausea, vomiting, diarrhea, abdominal pain, fecal incontinence Genitourinary: No dysuria.   She has hesitancy. Sometimes she has difficulty emptying completely Flomax has helped urinary hesitancy. 1 x nocturia. Musculoskeletal: as above Integumentary: No rash, pruritus, skin lesions Neurological: as above Psychiatric: Mild depression at this time.  Moderate anxiety Endocrine: No palpitations, diaphoresis, change in appetite, change in weigh or increased thirst Hematologic/Lymphatic: No anemia, purpura, petechiae. Allergic/Immunologic: No itchy/runny eyes, nasal congestion, recent allergic reactions, rashes  ALLERGIES: No Known Allergies  HOME MEDICATIONS:  Current Outpatient Prescriptions:  .  Alemtuzumab (LEMTRADA) 12 MG/1.2ML SOLN, Inject 12 mg into the vein See admin instructions. Pt receives every 3 years., Disp: , Rfl:  .  amitriptyline (ELAVIL) 25 MG tablet, Take one or two at bedtime (Patient taking differently: Take 50 mg by mouth at bedtime as needed for sleep. ), Disp: 60 tablet, Rfl: 11 .  amphetamine-dextroamphetamine (ADDERALL XR) 20 MG 24 hr capsule, Take 1 capsule (20 mg total) by mouth daily., Disp: 30 capsule, Rfl: 0 .  baclofen (LIORESAL) 20 MG tablet, Take 1 tablet (20 mg total) by mouth 3 (three) times daily as needed (for ms). (Patient taking differently: Take 20 mg by mouth 3 (three) times daily as needed for muscle spasms. ), Disp: 90 each, Rfl: 11 .  busPIRone (BUSPAR) 15 MG tablet, Take 1 tablet (15 mg total) by mouth 2 (two) times daily., Disp: 60 tablet, Rfl: 11 .  citalopram (CELEXA) 40 MG tablet, Take 1 tablet (40 mg total) by mouth daily., Disp: 90 tablet, Rfl: 3 .  dalfampridine 10 MG TB12, Take 1 tablet (10 mg total) by mouth 2 (two) times daily., Disp: 60 tablet, Rfl: 11 .  gabapentin (NEURONTIN) 800 MG tablet, Take 1 tablet (800 mg total) by mouth 3 (three) times daily., Disp: 90 tablet,  Rfl: 11 .  HYDROcodone-acetaminophen (NORCO/VICODIN) 5-325 MG tablet, Take 1 tablet by mouth 3 (three) times daily as needed for moderate pain., Disp: 20 tablet, Rfl: 0 .  lamoTRIgine (LAMICTAL) 200 MG tablet, Take 1 tablet (200 mg total) by mouth 2 (two) times daily., Disp: 60 tablet, Rfl: 11 .  lisinopril (PRINIVIL,ZESTRIL) 20 MG tablet, TAKE 1 TABLET BY MOUTH EVERY DAY, Disp: 90 tablet, Rfl: 1 .  naproxen (NAPROSYN) 375 MG tablet, Take 1 tablet (375 mg total) by mouth 2 (two) times daily. (Patient taking differently: Take 375 mg by mouth 2 (two) times daily as needed for mild pain. ), Disp: 20 tablet, Rfl: 0 .  Oxcarbazepine (TRILEPTAL) 300 MG tablet, Take 1 tablet (300 mg total) by mouth 2 (two) times daily., Disp: 60 tablet, Rfl: 1 .  SUMAtriptan (IMITREX) 100 MG tablet, Take 1 tablet (100 mg total) by mouth once as needed for migraine. May repeat in 2 hours if headache persists or recurs., Disp: 10 tablet, Rfl:  0 .  tamsulosin (FLOMAX) 0.4 MG CAPS capsule, Take 0.4 mg by mouth daily., Disp: , Rfl: 11 .  tiZANidine (ZANAFLEX) 4 MG tablet, TAKE 1 TABLET BY MOUTH AT BEDTIME, Disp: 30 tablet, Rfl: 11 .  traMADol (ULTRAM-ER) 300 MG 24 hr tablet, Take 1 tablet (300 mg total) by mouth daily. (Patient taking differently: Take 300 mg by mouth daily as needed for pain. ), Disp: 30 tablet, Rfl: 3 .  TRI-LO-MARZIA 0.18/0.215/0.25 MG-25 MCG tab, TAKE 1 TABLET BY MOUTH DAILY., Disp: 28 tablet, Rfl: 5 .  valACYclovir (VALTREX) 500 MG tablet, Take 1 tablet (500 mg total) by mouth 2 (two) times daily. (Patient taking differently: Take 500 mg by mouth daily. ), Disp: 60 tablet, Rfl: 2  PAST MEDICAL HISTORY: Past Medical History:  Diagnosis Date  . Anxiety   . Depression   . Hypertension   . MS (multiple sclerosis) ([place])     PAST SURGICAL HISTORY: Past Surgical History:  Procedure Laterality Date  . BILATERAL HIP ARTHROSCOPY Left 07/2013  . JOINT REPLACEMENT Bilateral     R 2004 and L 2005    FAMILY  HISTORY: Family History  Problem Relation Age of Onset  . Healthy Mother   . Healthy Father   . Breast cancer Unknown        paternal grandmother dx age 60  . Colon cancer Unknown        paternal grandmother  . Hypertension Other   . Diabetes Other     SOCIAL HISTORY:  Social History   Social History  . Marital status: Single    Spouse name: N/A  . Number of children: N/A  . Years of education: N/A   Occupational History  . Not on file.   Social History Main Topics  . Smoking status: Never Smoker  . Smokeless tobacco: Never Used  . Alcohol use No  . Drug use: No  . Sexual activity: Not Currently    Birth control/ protection: Pill   Other Topics Concern  . Not on file   Social History Narrative  . No narrative on file     PHYSICAL EXAM  Vitals:   10/24/16 1418  BP: 130/80  Resp: 16  Weight: 194 lb 8 oz (88.2 kg)  Height: 5\' 5"  (1.651 m)    Body mass index is 32.37 kg/m.   General: The patient is well-developed and well-nourished and in no acute distress.  Musculoskeletal:   She has good neck ROM.     Neurologic Exam  Mental status: The patient is alert and oriented x 3 at the time of the examination. The patient has apparent normal recent and remote memory, with mildly reduced attention span and concentration ability.   Speech is normal.  Cranial nerves: Extraocular movements are full.  There is good facial sensation to soft touch bilaterally.Facial strength is normal.  Trapezius and sternocleidomastoid strength is normal. No dysarthria is noted.  The tongue is midline, and the patient has symmetric elevation of the soft palate. No obvious hearing deficits are noted.  Motor:  Muscle bulk is normal.   Muscle tone is increased in the left leg.. Strength is  5 / 5 in all 4 extremities.   Sensory: Sensory testing is Intact to touch and vibration sensation in all 4 extremities.  Coordination: Cerebellar testing reveals good finger-nose-finger .   There  is slightly reduced heel-to-shin on the right but her left leg has poor heel-to-shin.  Gait and station: Station is now near normal.  She has a wide stance. Gait requires unilateral support . She is unable to tandem walk.    She has a left foot drop.    Reflexes: Deep tendon reflexes are increased in the legs, left greater than right. There is spread at the knees, worse on the left. There is nonsustained clonus in clonus on the right and sustained clonus on the left.      DIAGNOSTIC DATA (LABS, IMAGING, TESTING) - I reviewed patient records, labs, notes, testing and imaging myself where available.  Lab Results  Component Value Date   WBC 4.4 09/28/2016   HGB 12.3 09/28/2016   HCT 36.6 09/28/2016   MCV 96 09/28/2016   PLT 269 09/28/2016      Component Value Date/Time   NA 138 08/14/2016 0417   NA 145 (H) 01/04/2016 1438   K 4.1 08/14/2016 0417   CL 104 08/14/2016 0417   CO2 25 08/14/2016 0417   GLUCOSE 115 (H) 08/14/2016 0417   BUN 20 08/14/2016 0417   BUN 12 01/04/2016 1438   CREATININE 1.32 (H) 08/14/2016 0417   CREATININE 0.85 07/03/2014 0959   CALCIUM 8.7 (L) 08/14/2016 0417   PROT 7.3 08/13/2016 0232   PROT 7.2 06/03/2015 1635   ALBUMIN 3.9 08/13/2016 0232   ALBUMIN 4.4 06/03/2015 1635   AST 24 08/13/2016 0232   ALT 21 08/13/2016 0232   ALKPHOS 96 08/13/2016 0232   BILITOT 0.4 08/13/2016 0232   BILITOT 0.3 06/03/2015 1635   GFRNONAA 50 (L) 08/14/2016 0417   GFRNONAA 88 07/03/2014 0959   GFRAA 58 (L) 08/14/2016 0417   GFRAA >89 07/03/2014 0959   No results found for: CHOL, HDL, LDLCALC, LDLDIRECT, TRIG, CHOLHDL Lab Results  Component Value Date   HGBA1C 4.7 11/05/2012   No results found for: VITAMINB12 Lab Results  Component Value Date   TSH 1.970 04/19/2016      ASSESSMENT AND PLAN  Multiple sclerosis (HCC)  Urinary hesitancy - Plan: Ambulatory referral to Urology  Gait disturbance  Other fatigue  Numbness  Other chronic pain  Left leg  weakness  Hydronephrosis of left kidney - Plan: Ambulatory referral to Urology  Attention deficit   1.   We discussed the importance of the REMS program.   She has been compliant with her monthly blood and urine tests required for Lemtrada use.   2.    Due to her urinary hesitancy and hydronephrosis on the left, she will be referred to urology 3.   Trial of Adderall XR for her MS-related fatigue and attentional deficits. Continue medications for anxiety, pain and spasticity 4.   Return to see me in 4 months or sooner if there are new or worsening neurologic symptoms.  Jasiyah Paulding A. Felecia Shelling, MD, PhD 8/63/8177, 1:16 PM Certified in Neurology, Clinical Neurophysiology, Sleep Medicine, Pain Medicine and Neuroimaging  [facility] [address] [place], [place] [address] [phone] ]

## 2016-10-25 ENCOUNTER — Telehealth: Payer: Self-pay | Admitting: *Deleted

## 2016-10-25 ENCOUNTER — Telehealth: Payer: Self-pay | Admitting: Neurology

## 2016-10-25 MED ORDER — VALACYCLOVIR HCL 500 MG PO TABS: 500.0000 mg | ORAL_TABLET | Freq: Two times a day (BID) | ORAL | 2 refills | Status: DC

## 2016-10-25 NOTE — Telephone Encounter (Signed)
LMOM (identified vm) that [facility]alt[facility] has been [facility].  She just needs to take this for 3 months (since she had [place]).  She does not need to return this call unless she has questions/fim

## 2016-10-25 NOTE — Telephone Encounter (Signed)
Pt called the clinic today said she will be going to ED today. She said when she tried not to fall on her face yesterday she twisted her neck and she will need to have it checked out.  FYI

## 2016-10-25 NOTE — Telephone Encounter (Signed)
Pt calling re: the refill of her traMADol (ULTRAM-ER) 300 MG 24 hr tablet, she states she is down to about 3.  Please send to  [id number] - [place], [place] [phone] (Phone) [phone] (Fax   She has checked with the pharmacy and they have not received the prescription yet.  Pt asking to be called only if there is a problem with medication being filled

## 2016-10-25 NOTE — Telephone Encounter (Signed)
I have spoken with Hien this afternoon.  Last Tramadol rs. was given with fill date 08/27/16, had 3 r/f.  She should have r/f available.  She will check with [facility] and let me know if there is a problem/fim

## 2016-10-25 NOTE — Telephone Encounter (Signed)
Noted.  RAS aware/fim

## 2016-11-07 ENCOUNTER — Other Ambulatory Visit: Payer: Self-pay | Admitting: Obstetrics & Gynecology

## 2016-11-07 DIAGNOSIS — Z01419 Encounter for gynecological examination (general) (routine) without abnormal findings: Secondary | ICD-10-CM

## 2016-11-07 DIAGNOSIS — Z3041 Encounter for surveillance of contraceptive pills: Secondary | ICD-10-CM

## 2016-11-09 ENCOUNTER — Other Ambulatory Visit: Payer: Self-pay | Admitting: Neurology

## 2016-11-14 ENCOUNTER — Ambulatory Visit: Payer: Medicare Other | Admitting: Neurology

## 2016-11-22 ENCOUNTER — Other Ambulatory Visit: Payer: Self-pay | Admitting: Neurology

## 2016-12-02 ENCOUNTER — Encounter: Payer: Self-pay | Admitting: Neurology

## 2016-12-06 ENCOUNTER — Ambulatory Visit: Payer: Self-pay | Admitting: Neurology

## 2016-12-14 ENCOUNTER — Telehealth: Payer: Self-pay | Admitting: Neurology

## 2016-12-14 NOTE — Telephone Encounter (Signed)
Patient requesting lab results

## 2016-12-14 NOTE — Telephone Encounter (Signed)
Spoke with Lenola and explained [place] labs ok.  Confirmed 12/28/16 4pm f/u appt. with RAS/fim

## 2016-12-15 ENCOUNTER — Telehealth: Payer: Self-pay | Admitting: Neurology

## 2016-12-15 ENCOUNTER — Ambulatory Visit (INDEPENDENT_AMBULATORY_CARE_PROVIDER_SITE_OTHER): Payer: Medicare Other | Admitting: Obstetrics & Gynecology

## 2016-12-15 ENCOUNTER — Encounter: Payer: Self-pay | Admitting: Obstetrics & Gynecology

## 2016-12-15 ENCOUNTER — Other Ambulatory Visit (HOSPITAL_COMMUNITY)
Admission: RE | Admit: 2016-12-15 | Discharge: 2016-12-15 | Disposition: A | Discharge status: Home / Self Care | Payer: Medicare Other | Source: Ambulatory Visit | Attending: Obstetrics & Gynecology | Admitting: Obstetrics & Gynecology

## 2016-12-15 VITALS — BP 107/76 | HR 93 | Wt 194.5 lb

## 2016-12-15 DIAGNOSIS — Z113 Encounter for screening for infections with a predominantly sexual mode of transmission: Secondary | ICD-10-CM | POA: Diagnosis present

## 2016-12-15 DIAGNOSIS — N898 Other specified noninflammatory disorders of vagina: Secondary | ICD-10-CM

## 2016-12-15 LAB — POCT URINALYSIS DIP (DEVICE)
BILIRUBIN URINE: NEGATIVE
GLUCOSE, UA: NEGATIVE mg/dL
HGB URINE DIPSTICK: NEGATIVE
Ketones, ur: NEGATIVE mg/dL
LEUKOCYTES UA: NEGATIVE
Nitrite: NEGATIVE
PH: 6.5 (ref 5.0–8.0)
Protein, ur: NEGATIVE mg/dL
SPECIFIC GRAVITY, URINE: 1.02 (ref 1.005–1.030)
Urobilinogen, UA: 0.2 mg/dL (ref 0.0–1.0)

## 2016-12-15 MED ORDER — AMPHETAMINE-DEXTROAMPHET ER 20 MG PO CP24: 20.0000 mg | ORAL_CAPSULE | Freq: Every day | ORAL | 0 refills | Status: DC

## 2016-12-15 NOTE — Telephone Encounter (Signed)
Pt request refill for amphetamine-dextroamphetamine (ADDERALL XR) 20 MG 24 hr capsule . Pt is aware the clinic closes at noon tomorrow

## 2016-12-15 NOTE — Progress Notes (Signed)
Patient ID: Dawn Peters, female   DOB: [birth year], 36 y.o.   MRN: [id number] Cc: vaginal odor and asks for breast exam as she has gained weight  HPI Dawn Peters is a 36 y.o. female.  [id number] Patient's last menstrual period was 12/01/2016 (within days). She notes a vaginal odor and increased breast size HPI  Past Medical History:  Diagnosis Date  . Anxiety   . Depression   . Hypertension   . MS (multiple sclerosis) ([place])     Past Surgical History:  Procedure Laterality Date  . BILATERAL HIP ARTHROSCOPY Left 07/2013  . JOINT REPLACEMENT Bilateral     R 2004 and L 2005    Family History  Problem Relation Age of Onset  . Healthy Mother   . Healthy Father   . Breast cancer Unknown        paternal grandmother dx age 74  . Colon cancer Unknown        paternal grandmother  . Hypertension Other   . Diabetes Other     Social History Social History  Substance Use Topics  . Smoking status: Never Smoker  . Smokeless tobacco: Never Used  . Alcohol use No    No Known Allergies  Current Outpatient Prescriptions  Medication Sig Dispense Refill  . Alemtuzumab (LEMTRADA) 12 MG/1.2ML SOLN Inject 12 mg into the vein See admin instructions. Pt receives every 3 years.    Marland Kitchen amitriptyline (ELAVIL) 25 MG tablet TAKE ONE OR TWO AT BEDTIME 60 tablet 9  . amphetamine-dextroamphetamine (ADDERALL XR) 20 MG 24 hr capsule Take 1 capsule (20 mg total) by mouth daily. 30 capsule 0  . baclofen (LIORESAL) 20 MG tablet Take 1 tablet (20 mg total) by mouth 3 (three) times daily as needed (for ms). (Patient taking differently: Take 20 mg by mouth 3 (three) times daily as needed for muscle spasms. ) 90 each 11  . busPIRone (BUSPAR) 15 MG tablet Take 1 tablet (15 mg total) by mouth 2 (two) times daily. 60 tablet 11  . citalopram (CELEXA) 40 MG tablet Take 1 tablet (40 mg total) by mouth daily. 90 tablet 3  . dalfampridine 10 MG TB12 Take 1 tablet (10 mg total) by mouth 2 (two) times daily. 60  tablet 11  . gabapentin (NEURONTIN) 800 MG tablet Take 1 tablet (800 mg total) by mouth 3 (three) times daily. 90 tablet 11  . HYDROcodone-acetaminophen (NORCO/VICODIN) 5-325 MG tablet Take 1 tablet by mouth 3 (three) times daily as needed for moderate pain. 20 tablet 0  . lamoTRIgine (LAMICTAL) 200 MG tablet Take 1 tablet (200 mg total) by mouth 2 (two) times daily. 60 tablet 11  . lisinopril (PRINIVIL,ZESTRIL) 20 MG tablet TAKE 1 TABLET BY MOUTH EVERY DAY 90 tablet 1  . naproxen (NAPROSYN) 375 MG tablet Take 1 tablet (375 mg total) by mouth 2 (two) times daily. (Patient taking differently: Take 375 mg by mouth 2 (two) times daily as needed for mild pain. ) 20 tablet 0  . Oxcarbazepine (TRILEPTAL) 300 MG tablet Take 1 tablet (300 mg total) by mouth 2 (two) times daily. 60 tablet 1  . SUMAtriptan (IMITREX) 100 MG tablet Take 1 tablet (100 mg total) by mouth once as needed for migraine. May repeat in 2 hours if headache persists or recurs. 10 tablet 0  . tamsulosin (FLOMAX) 0.4 MG CAPS capsule Take 0.4 mg by mouth daily.  11  . tiZANidine (ZANAFLEX) 4 MG tablet TAKE 1 TABLET BY MOUTH AT BEDTIME 30  tablet 11  . traMADol (ULTRAM-ER) 300 MG 24 hr tablet Take 1 tablet (300 mg total) by mouth daily. (Patient taking differently: Take 300 mg by mouth daily as needed for pain. ) 30 tablet 3  . TRI-LO-MARZIA 0.18/0.215/0.25 MG-25 MCG tab TAKE 1 TABLET BY MOUTH DAILY. 28 tablet 5  . TRI-LO-MARZIA 0.18/0.215/0.25 MG-25 MCG tab TAKE 1 TABLET BY MOUTH DAILY. 28 tablet 5  . valACYclovir (VALTREX) 500 MG tablet Take 1 tablet (500 mg total) by mouth 2 (two) times daily. 60 tablet 2   No current facility-administered medications for this visit.     Review of Systems Review of Systems  Constitutional: Positive for unexpected weight change.  Genitourinary: Positive for difficulty urinating and vaginal discharge (odor). Negative for menstrual problem and pelvic pain.    Blood pressure 107/76, pulse 93, weight 194  lb 8 oz (88.2 kg), last menstrual period 12/01/2016.  Physical Exam Physical Exam  Constitutional: She is oriented to person, place, and time. She appears well-developed. No distress.  Cardiovascular: Normal rate.   Pulmonary/Chest: Effort normal. No respiratory distress.  Genitourinary: Vaginal discharge: slight white.  Neurological: She is alert and oriented to person, place, and time.  Psychiatric: She has a normal mood and affect. Her behavior is normal.  Breasts: breasts appear normal, no suspicious masses, no skin or nipple changes or axillary nodes.   Data Reviewed Pap 2016 normal  Assessment    Vaginal discharge and odor   Normal exam  Plan    Annual exam 2019 Testing f/u result        Emeterio Reeve 12/15/2016, 11:39 AM

## 2016-12-15 NOTE — Telephone Encounter (Signed)
Rx. awaiting RAS sig/fim 

## 2016-12-15 NOTE — Patient Instructions (Signed)
Breast Self-Awareness Breast self-awareness means:  Knowing how your breasts look.  Knowing how your breasts feel.  Checking your breasts every month for changes.  Telling your doctor if you notice a change in your breasts.  Breast self-awareness allows you to notice a breast problem early while it is still small. How to do a breast self-exam One way to learn what is normal for your breasts and to check for changes is to do a breast self-exam. To do a breast self-exam: Look for Changes  1. Take off all the clothes above your waist. 2. Stand in front of a mirror in a room with good lighting. 3. Put your hands on your hips. 4. Push your hands down. 5. Look at your breasts and nipples in the mirror to see if one breast or nipple looks different than the other. Check to see if: ? The shape of one breast is different. ? The size of one breast is different. ? There are wrinkles, dips, and bumps in one breast and not the other. 6. Look at each breast for changes in your skin, such as: ? Redness. ? Scaly areas. 7. Look for changes in your nipples, such as: ? Liquid around the nipples. ? Bleeding. ? Dimpling. ? Redness. ? A change in where the nipples are. Feel for Changes 1. Lie on your back on the floor. 2. Feel each breast. To do this, follow these steps: ? Pick a breast to feel. ? Put the arm closest to that breast above your head. ? Use your other arm to feel the nipple area of your breast. Feel the area with the pads of your three middle fingers by making small circles with your fingers. For the first circle, press lightly. For the second circle, press harder. For the third circle, press even harder. ? Keep making circles with your fingers at the light, harder, and even harder pressures as you move down your breast. Stop when you feel your ribs. ? Move your fingers a little toward the center of your body. ? Start making circles with your fingers again, this time going up until  you reach your collarbone. ? Keep making up and down circles until you reach your armpit. Remember to keep using the three pressures. ? Feel the other breast in the same way. 3. Sit or stand in the shower or tub. 4. With soapy water on your skin, feel each breast the same way you did in step 2, when you were lying on the floor. Write Down What You Find  After doing the self-exam, write down:  What is normal for each breast.  Any changes you find in each breast.  When you last had your period.  How often should I check my breasts? Check your breasts every month. If you are breastfeeding, the best time to check them is after you feed your baby or after you use a breast pump. If you get periods, the best time to check your breasts is 5-7 days after your period is over. When should I see my doctor? See your doctor if you notice:  A change in shape or size of your breasts or nipples.  A change in the skin of your breast or nipples, such as red or scaly skin.  Unusual fluid coming from your nipples.  A lump or thick area that was not there before.  Pain in your breasts.  Anything that concerns you.  This information is not intended to replace advice given to   you by your health care provider. Make sure you discuss any questions you have with your health care provider. Document Released: 08/10/2007 Document Revised: 07/30/2015 Document Reviewed: 01/11/2015 Elsevier Interactive Patient Education  2018 [employer]vier Inc[employer]  

## 2016-12-16 LAB — CERVICOVAGINAL ANCILLARY ONLY
Bacterial vaginitis: NEGATIVE
Candida vaginitis: NEGATIVE
Chlamydia: NEGATIVE
Neisseria Gonorrhea: NEGATIVE
TRICH (WINDOWPATH): NEGATIVE

## 2016-12-19 NOTE — Telephone Encounter (Signed)
Rx. was placed up front [facility] last wk/fim

## 2016-12-20 ENCOUNTER — Telehealth: Payer: Self-pay | Admitting: General Practice

## 2016-12-20 ENCOUNTER — Other Ambulatory Visit: Payer: Self-pay | Admitting: Neurology

## 2016-12-20 NOTE — Telephone Encounter (Signed)
Patient called & left message requesting results. Called patient and informed her of negative results. Patient verbalized understanding & had no questions

## 2016-12-27 NOTE — Telephone Encounter (Signed)
Pt has called asking for the results of labs from last week, she states she has not heard anything about those results. Please call

## 2016-12-27 NOTE — Telephone Encounter (Signed)
Spoke with Dawn Peters and advised her Oct. [name] labs look good.  Platelets are ok (were low once, now ok)/fim

## 2016-12-28 ENCOUNTER — Encounter: Payer: Self-pay | Admitting: Neurology

## 2016-12-28 ENCOUNTER — Ambulatory Visit (INDEPENDENT_AMBULATORY_CARE_PROVIDER_SITE_OTHER): Payer: Medicare Other | Admitting: Neurology

## 2016-12-28 ENCOUNTER — Emergency Department (HOSPITAL_COMMUNITY)
Admission: EM | Admit: 2016-12-28 | Discharge: 2016-12-29 | Disposition: A | Discharge status: Home / Self Care | Payer: Medicare Other | Attending: Emergency Medicine | Admitting: Emergency Medicine

## 2016-12-28 ENCOUNTER — Encounter (HOSPITAL_COMMUNITY): Payer: Self-pay

## 2016-12-28 VITALS — BP 134/91 | HR 100 | Resp 18 | Ht 65.0 in | Wt 196.5 lb

## 2016-12-28 DIAGNOSIS — R3911 Hesitancy of micturition: Secondary | ICD-10-CM | POA: Diagnosis not present

## 2016-12-28 DIAGNOSIS — N399 Disorder of urinary system, unspecified: Secondary | ICD-10-CM | POA: Diagnosis not present

## 2016-12-28 DIAGNOSIS — T783XXA Angioneurotic edema, initial encounter: Secondary | ICD-10-CM | POA: Diagnosis not present

## 2016-12-28 DIAGNOSIS — G35 Multiple sclerosis: Secondary | ICD-10-CM

## 2016-12-28 DIAGNOSIS — R29898 Other symptoms and signs involving the musculoskeletal system: Secondary | ICD-10-CM

## 2016-12-28 DIAGNOSIS — R269 Unspecified abnormalities of gait and mobility: Secondary | ICD-10-CM | POA: Diagnosis not present

## 2016-12-28 DIAGNOSIS — I10 Essential (primary) hypertension: Secondary | ICD-10-CM | POA: Diagnosis not present

## 2016-12-28 DIAGNOSIS — N1330 Unspecified hydronephrosis: Secondary | ICD-10-CM | POA: Diagnosis not present

## 2016-12-28 DIAGNOSIS — R600 Localized edema: Secondary | ICD-10-CM | POA: Diagnosis present

## 2016-12-28 DIAGNOSIS — Z79899 Other long term (current) drug therapy: Secondary | ICD-10-CM | POA: Diagnosis not present

## 2016-12-28 DIAGNOSIS — T464X5A Adverse effect of angiotensin-converting-enzyme inhibitors, initial encounter: Secondary | ICD-10-CM

## 2016-12-28 MED ORDER — SUMATRIPTAN SUCCINATE 100 MG PO TABS: 100.0000 mg | ORAL_TABLET | Freq: Once | ORAL | 3 refills | Status: DC | PRN

## 2016-12-28 NOTE — Progress Notes (Signed)
[facility]  PATIENT: Dawn Peters DOB: [birth year]  REFERRING DOCTOR OR PCP:  Dr. Annitta Needs SOURCE: Patient, records in EMR, lab results and radiology reports in EMR, MRI images reviewed on PACS.  _________________________________   HISTORICAL  CHIEF COMPLAINT:  Chief Complaint  Patient presents with  . Multiple Sclerosis    2nd yr. Lemtrada infusions were August 7,8,9 of 2018.  Still has some trouble with left leg weakness, left drop foot.  Has an AFO but sts. is not able to wear it due to weight gain.  She will call [employer] to see if they are able to adjust her AFO/fim    HISTORY OF PRESENT ILLNESS:   Dawn Peters is a 36 year-old woman who was diagnosed with MS in 2000.     Update 12/28/2016:   She had her 3 day course of Lemtrada in August. She tolerated it well. She has not had any exacerbations while on Lemtrada for the MS. Her last MRI was in June 2018 and did not show any new lesions. She continues to have difficulty with her gait due to the left leg weakness and spasticity. An AFO initially helped her walk but she is having trouble with fit that she believes is worse due to weight gain. Balance is also mildly off. Spasticity is predominantly in the left leg she gets some benefit with baclofen and tizanidine.    She has painful dysesthesias of the left leg helped partially by gabapentin and lamotrigine. She also sees pain management for opiates. She has worsening urinary hesitancy this year and an MRI of the thoracic spine showed hydronephrosis. She has fatigue is both physical and cognitive. It is worse in the heat so she feels it is doing better than a couple months ago. Well most nights. Mood has done better with citalopram and BuSpar.  ________________________________________ From 10/24/2016:  MS:   She had her 3 day course of Lemtrada in the office a couple weeks ago and thee infusions went well. . She is JCV high titer positive and had switched  from Tysabri to [place] earlier.     Gait/strength:    Gait has worsened due mostly to the left leg weakness and spasticity over the last year. She got her left AFO and is starting to get used to it.   She has trouble lifting the left leg/foot.  Ba;lance is off.     She is on Ampyra with some benefit.  Arms are doing ok.  She has left sided spasticity, only partially helped by baclofen and tizanidine.  Dysesthesia:   Patient continues to report painful left leg dysesthesias. These are similar to the last visit.  .  She is currently on gabapentin 800 mg by mouth 4 times a day and lamotrigine 200 mg twice a day for the dysesthetic pain.  With her medications and tramadol she feels that the pain is tolerable.   Vision:    She denies any MS related visual problems.  Bladder:   Urinary hesitancy continues to be a problem. She gets some benefit from tamsulosin. MRI of the thoracic spine with recent hospitalization shows hydronephrosis on the left.  She has not yet been referred to urology.  Bowel is fine..  Fatigue/sleep:   She is noting more trouble with her fatigue, no occurring daily.  . She does especially poorly in the hear.  She denies sleepiness -- issues are more fatigue.   She has never tried a stimulant.   She sleeps  well most nights.  Mood:  Her depression and anxiety is better with citalopram 40 mg and buspar bid.    Anxiety is more of a problem than depression and she feels that the medications are not completely controlling this.  Cognition:   She denies significant difficulties with her cognitive abilities.   Focus is mildly reduced.  MS history:   She was diagnosed with MS in 2000 after presenting with gait difficulties, numbness and headaches. An MRI of the brain was consistent with MS. She was then started on Betaseron. She had difficulty tolerating Betaseron and at some point switched to Novantrone. She took Novantrone every 3 months for a year or so. A little later, she was placed on  Tysabri and she stayed on it for a couple of years. However, she was JCV-positive and switched off. She did well on Tysabri. She had been on Tecfidera since 2015. Her MRI from June 2015 showed that there had been no changes compared to an MRI from 2014.   However, she had an exacerbation March 2017.   The 08/07/2013 MRI of the brain shows multiple T2/FLAIR hyperintense foci located in the cerebellum, middle cerebellar peduncles, pons and in the periventricular white matter of the hemispheres in a pattern and configuration consistent with chronic demyelinating plaque associated with MS. There were no acute findings. There was no change compared to the previous MRI from 01/16/2013 that was also reviewed. An MRI of the cervical spine shows subtle T2 hyperintense signal within the cord and there were no acute findings.  REVIEW OF SYSTEMS: Constitutional: No fevers, chills, sweats, or change in appetite.   Notes fatigue, some insomnia Eyes: No visual changes, double vision, eye pain Ear, nose and throat: No hearing loss, ear pain, nasal congestion, sore throat Cardiovascular: No chest pain, palpitations Respiratory: No shortness of breath at rest or with exertion.   No wheezes GastrointestinaI: No nausea, vomiting, diarrhea, abdominal pain, fecal incontinence Genitourinary: No dysuria.   She has hesitancy. Sometimes she has difficulty emptying completely Flomax has helped urinary hesitancy. 1 x nocturia. Musculoskeletal: as above Integumentary: No rash, pruritus, skin lesions Neurological: as above Psychiatric: Mild depression at this time.  Moderate anxiety Endocrine: No palpitations, diaphoresis, change in appetite, change in weigh or increased thirst Hematologic/Lymphatic: No anemia, purpura, petechiae. Allergic/Immunologic: No itchy/runny eyes, nasal congestion, recent allergic reactions, rashes  ALLERGIES: No Known Allergies  HOME MEDICATIONS:  Current Outpatient Prescriptions:  .   Alemtuzumab (LEMTRADA) 12 MG/1.2ML SOLN, Inject 12 mg into the vein See admin instructions. Pt receives every 3 years., Disp: , Rfl:  .  amitriptyline (ELAVIL) 25 MG tablet, TAKE ONE OR TWO AT BEDTIME, Disp: 60 tablet, Rfl: 9 .  amphetamine-dextroamphetamine (ADDERALL XR) 20 MG 24 hr capsule, Take 1 capsule (20 mg total) by mouth daily., Disp: 30 capsule, Rfl: 0 .  baclofen (LIORESAL) 20 MG tablet, Take 1 tablet (20 mg total) by mouth 3 (three) times daily as needed (for ms). (Patient taking differently: Take 20 mg by mouth 3 (three) times daily as needed for muscle spasms. ), Disp: 90 each, Rfl: 11 .  busPIRone (BUSPAR) 15 MG tablet, Take 1 tablet (15 mg total) by mouth 2 (two) times daily., Disp: 60 tablet, Rfl: 11 .  citalopram (CELEXA) 40 MG tablet, Take 1 tablet (40 mg total) by mouth daily., Disp: 90 tablet, Rfl: 3 .  dalfampridine 10 MG TB12, Take 1 tablet (10 mg total) by mouth 2 (two) times daily., Disp: 60 tablet, Rfl: 11 .  gabapentin (NEURONTIN) 800 MG tablet, Take 1 tablet (800 mg total) by mouth 3 (three) times daily., Disp: 90 tablet, Rfl: 11 .  HYDROcodone-acetaminophen (NORCO/VICODIN) 5-325 MG tablet, Take 1 tablet by mouth 3 (three) times daily as needed for moderate pain., Disp: 20 tablet, Rfl: 0 .  lamoTRIgine (LAMICTAL) 200 MG tablet, Take 1 tablet (200 mg total) by mouth 2 (two) times daily., Disp: 60 tablet, Rfl: 11 .  lisinopril (PRINIVIL,ZESTRIL) 20 MG tablet, TAKE 1 TABLET BY MOUTH EVERY DAY, Disp: 90 tablet, Rfl: 1 .  naproxen (NAPROSYN) 375 MG tablet, Take 1 tablet (375 mg total) by mouth 2 (two) times daily. (Patient taking differently: Take 375 mg by mouth 2 (two) times daily as needed for mild pain. ), Disp: 20 tablet, Rfl: 0 .  Oxcarbazepine (TRILEPTAL) 300 MG tablet, Take 1 tablet (300 mg total) by mouth 2 (two) times daily., Disp: 60 tablet, Rfl: 1 .  SUMAtriptan (IMITREX) 100 MG tablet, Take 1 tablet (100 mg total) by mouth once as needed for migraine. May repeat in 2  hours if headache persists or recurs., Disp: 10 tablet, Rfl: 3 .  tamsulosin (FLOMAX) 0.4 MG CAPS capsule, Take 0.4 mg by mouth daily., Disp: , Rfl: 11 .  tiZANidine (ZANAFLEX) 4 MG tablet, TAKE 1 TABLET BY MOUTH AT BEDTIME, Disp: 30 tablet, Rfl: 11 .  traMADol (ULTRAM-ER) 300 MG 24 hr tablet, TAKE 1 TABLET BY MOUTH DAILY, Disp: 30 tablet, Rfl: 5 .  TRI-LO-MARZIA 0.18/0.215/0.25 MG-25 MCG tab, TAKE 1 TABLET BY MOUTH DAILY., Disp: 28 tablet, Rfl: 5 .  TRI-LO-MARZIA 0.18/0.215/0.25 MG-25 MCG tab, TAKE 1 TABLET BY MOUTH DAILY., Disp: 28 tablet, Rfl: 5 .  valACYclovir (VALTREX) 500 MG tablet, Take 1 tablet (500 mg total) by mouth 2 (two) times daily., Disp: 60 tablet, Rfl: 2  PAST MEDICAL HISTORY: Past Medical History:  Diagnosis Date  . Anxiety   . Depression   . Hypertension   . MS (multiple sclerosis) ([place])     PAST SURGICAL HISTORY: Past Surgical History:  Procedure Laterality Date  . BILATERAL HIP ARTHROSCOPY Left 07/2013  . JOINT REPLACEMENT Bilateral     R 2004 and L 2005    FAMILY HISTORY: Family History  Problem Relation Age of Onset  . Healthy Mother   . Healthy Father   . Breast cancer Unknown        paternal grandmother dx age 26  . Colon cancer Unknown        paternal grandmother  . Hypertension Other   . Diabetes Other     SOCIAL HISTORY:  Social History   Social History  . Marital status: Single    Spouse name: N/A  . Number of children: N/A  . Years of education: N/A   Occupational History  . Not on file.   Social History Main Topics  . Smoking status: Never Smoker  . Smokeless tobacco: Never Used  . Alcohol use No  . Drug use: No  . Sexual activity: Not Currently    Birth control/ protection: Pill   Other Topics Concern  . Not on file   Social History Narrative  . No narrative on file     PHYSICAL EXAM  Vitals:   12/28/16 1559  BP: (!) 134/91  Pulse: 100  Resp: 18  Weight: 196 lb 8 oz (89.1 kg)  Height: 5\' 5"  (1.651 m)    Body  mass index is 32.7 kg/m.   General: The patient is well-developed and well-nourished and in  no acute distress.  Musculoskeletal:   She has good neck ROM.     Neurologic Exam  Mental status: The patient is alert and oriented x 3 at the time of the examination. The patient has apparent normal recent and remote memory, with mildly reduced attention span and concentration ability.   Speech is normal.  Cranial nerves: Extraocular movements are full.  There is good facial sensation to soft touch bilaterally.Facial strength is normal.  Trapezius and sternocleidomastoid strength is normal. No dysarthria is noted.  The tongue is midline, and the patient has symmetric elevation of the soft palate. No obvious hearing deficits are noted.  Motor:  Muscle bulk is normal.   Muscle tone is increased in the left leg.. Strength is  5 / 5 in all 4 extremities.   Sensory: Touch and vibration sensation is normal in the arms or legs.  Coordination: Cerebellar testing reveals good finger-nose-finger .   She has mildly reduced right heel-to-shin and severely reduced left heel-to-shin.   Gait and station: Station is near normal.    She has a wide stance. She is walking without a cane . She is unable to tandem walk.    She has a left foot drop.    Reflexes: Deep tendon reflexes are increased in the legs, left greater than right. Deep and reflexes are increased at the knees, left greater than right. There is spread bilaterally. She has several beats of nonsustained clonus on the left and just 2 beats of nonsustained clonus on the right.    DIAGNOSTIC DATA (LABS, IMAGING, TESTING) - I reviewed patient records, labs, notes, testing and imaging myself where available.  Lab Results  Component Value Date   WBC 4.4 09/28/2016   HGB 12.3 09/28/2016   HCT 36.6 09/28/2016   MCV 96 09/28/2016   PLT 269 09/28/2016      Component Value Date/Time   NA 138 08/14/2016 0417   NA 145 (H) 01/04/2016 1438   K 4.1  08/14/2016 0417   CL 104 08/14/2016 0417   CO2 25 08/14/2016 0417   GLUCOSE 115 (H) 08/14/2016 0417   BUN 20 08/14/2016 0417   BUN 12 01/04/2016 1438   CREATININE 1.32 (H) 08/14/2016 0417   CREATININE 0.85 07/03/2014 0959   CALCIUM 8.7 (L) 08/14/2016 0417   PROT 7.3 08/13/2016 0232   PROT 7.2 06/03/2015 1635   ALBUMIN 3.9 08/13/2016 0232   ALBUMIN 4.4 06/03/2015 1635   AST 24 08/13/2016 0232   ALT 21 08/13/2016 0232   ALKPHOS 96 08/13/2016 0232   BILITOT 0.4 08/13/2016 0232   BILITOT 0.3 06/03/2015 1635   GFRNONAA 50 (L) 08/14/2016 0417   GFRNONAA 88 07/03/2014 0959   GFRAA 58 (L) 08/14/2016 0417   GFRAA >89 07/03/2014 0959   No results found for: CHOL, HDL, LDLCALC, LDLDIRECT, TRIG, CHOLHDL Lab Results  Component Value Date   HGBA1C 4.7 11/05/2012   No results found for: VITAMINB12 Lab Results  Component Value Date   TSH 1.970 04/19/2016      ASSESSMENT AND PLAN  Multiple sclerosis ([place]) - Plan: Ambulatory referral to Urology, Ambulatory referral to Physical Therapy  Urinary disorder  Urinary hesitancy - Plan: Ambulatory referral to Urology  Hydronephrosis of left kidney - Plan: Ambulatory referral to Urology  Left leg weakness - Plan: Ambulatory referral to Physical Therapy  Gait disturbance - Plan: Ambulatory referral to Physical Therapy   1.   We will continue to do annual MRIs to determine if she is having  any subclinical progression. She has now completed both Lemtrada doses.  She has been compliant with her monthly blood and urine tests required for Lemtrada use.   2.    Refer to urology for the urinary hesitancy and hydronephrosis on the left  3.    Continue medications for anxiety, pain and spasticity 4.    Refer to the [facility] physical therapy for evaluation of the [employer] foot drop stimulator  Return to see me in 6 months or sooner if there are new or worsening neurologic symptoms.  Sativa Gelles A. Felecia Shelling, MD, PhD 14/27/6701, 1:00 PM Certified in  Neurology, Clinical Neurophysiology, Sleep Medicine, Pain Medicine and Neuroimaging  [facility] [address] [place], [place] [address] [phone] ]

## 2016-12-28 NOTE — ED Notes (Signed)
Bed: WTR6 Expected date:  Expected time:  Means of arrival:  Comments: 

## 2016-12-29 ENCOUNTER — Encounter (HOSPITAL_COMMUNITY): Payer: Self-pay | Admitting: Emergency Medicine

## 2016-12-29 ENCOUNTER — Telehealth: Payer: Self-pay | Admitting: Neurology

## 2016-12-29 DIAGNOSIS — T783XXA Angioneurotic edema, initial encounter: Secondary | ICD-10-CM | POA: Diagnosis not present

## 2016-12-29 MED ORDER — DIPHENHYDRAMINE HCL 50 MG/ML IJ SOLN
25.0000 mg | Freq: Once | INTRAMUSCULAR | Status: AC
  Administered 2016-12-29: 25 mg via INTRAVENOUS
  Filled 2016-12-29: qty 1

## 2016-12-29 MED ORDER — FAMOTIDINE IN NACL 20-0.9 MG/50ML-% IV SOLN
20.0000 mg | Freq: Once | INTRAVENOUS | Status: AC
  Administered 2016-12-29: 20 mg via INTRAVENOUS
  Filled 2016-12-29: qty 50

## 2016-12-29 MED ORDER — METHYLPREDNISOLONE SODIUM SUCC 125 MG IJ SOLR
125.0000 mg | Freq: Once | INTRAMUSCULAR | Status: AC
  Administered 2016-12-29: 125 mg via INTRAVENOUS
  Filled 2016-12-29: qty 2

## 2016-12-29 NOTE — Telephone Encounter (Signed)
LMTC./fim 

## 2016-12-29 NOTE — Telephone Encounter (Signed)
Pt called said she went to ED reg swollen lips. She was advised she is allergic to lisinopril. Please call to advise on next step.

## 2016-12-29 NOTE — ED Triage Notes (Signed)
Pt complains of her bottom lip swelling since 3pm, she has been taking lisinopril since 2000 Pt doesn't have any sx

## 2016-12-29 NOTE — Telephone Encounter (Signed)
I have spoken with Dawn Peters.  A previous physician started her on Lisinopril for HTN.  [facility] provided refills for her, but HTN is not a dx. typically managed by this office; it is generally managed by a pcp.  She will check with her PCP tomorrow and call our office back Monday ([facility] is ooo this afternoon and Friday) if she needs anything further./fim

## 2016-12-29 NOTE — ED Provider Notes (Signed)
[facility] DEPT Provider Note   CSN: [id number] Arrival date & time: 12/28/16  2212     History   Chief Complaint Chief Complaint  Patient presents with  . Oral Swelling    HPI Dawn Peters is a 36 y.o. female.  The history is provided by the patient.  Allergic Reaction  Presenting symptoms: swelling   Presenting symptoms: no difficulty swallowing and no rash   Severity:  Mild Duration:  18 hours Prior allergic episodes:  No prior episodes Context: medications   Context comment:  Lisinopril Relieved by:  Nothing Worsened by:  Nothing Ineffective treatments:  None tried   Past Medical History:  Diagnosis Date  . Anxiety   . Depression   . Hypertension   . MS (multiple sclerosis) [facility])     Patient Active Problem List   Diagnosis Date Noted  . Hydronephrosis of left kidney 10/24/2016  . Attention deficit 10/24/2016  . Left leg weakness 08/13/2016  . Chronic pain 08/13/2016  . Mild renal insufficiency 08/13/2016  . Left-sided weakness   . High risk medication use 09/15/2015  . Hip pain, bilateral 07/28/2015  . Urinary hesitancy 07/28/2015  . Multiple sclerosis exacerbation ([place]) 07/17/2015  . Urinary disorder 06/11/2015  . Gait disturbance 05/19/2015  . Numbness 05/19/2015  . Depression with anxiety 05/13/2015  . Other fatigue 05/13/2015  . Left knee pain 05/13/2015  . Avascular necrosis of bones of both hips ([place]) 05/13/2015  . Screening for HIV (human immunodeficiency virus) 07/08/2014  . Essential hypertension, benign 11/19/2013  . Anxiety state, unspecified 11/19/2013  . Screen for STD (sexually transmitted disease) 11/01/2013  . Encounter for routine gynecological examination 11/01/2013  . Oral contraceptive use 10/20/2011  . Multiple sclerosis ([place]) 08/08/2007  . RASH AND OTHER NONSPECIFIC SKIN ERUPTION 06/22/2007  . AVASCULAR NECROSIS, FEMORAL HEAD 03/11/2006  . Essential hypertension 02/03/2006  .  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 DVT, HX OF 02/03/2006    Past Surgical History:  Procedure Laterality Date  . BILATERAL HIP ARTHROSCOPY Left 07/2013  . JOINT REPLACEMENT Bilateral     R 2004 and L 2005    OB History    Gravida Para Term Preterm AB Living   1 1 1     1    SAB TAB Ectopic Multiple Live Births           1       Home Medications    Prior to Admission medications   Medication Sig Start Date End Date Taking? Authorizing Provider  amitriptyline (ELAVIL) 25 MG tablet TAKE ONE OR TWO AT BEDTIME Patient taking differently: take 25-50mg  by mouth at bedtime 11/09/16  Yes Sater, Nanine Means, MD  amphetamine-dextroamphetamine (ADDERALL XR) 20 MG 24 hr capsule Take 1 capsule (20 mg total) by mouth daily. 12/15/16  Yes Sater, Nanine Means, MD  baclofen (LIORESAL) 20 MG tablet Take 1 tablet (20 mg total) by mouth 3 (three) times daily as needed (for ms). Patient taking differently: Take 20 mg by mouth 4 (four) times daily.  05/04/16  Yes Sater, Nanine Means, MD  busPIRone (BUSPAR) 15 MG tablet Take 1 tablet (15 mg total) by mouth 2 (two) times daily. 05/04/16  Yes Sater, Nanine Means, MD  citalopram (CELEXA) 40 MG tablet Take 1 tablet (40 mg total) by mouth daily. 05/04/16  Yes Sater, Nanine Means, MD  dalfampridine 10 MG TB12 Take 1 tablet (10 mg total) by mouth 2 (two) times daily. 08/08/16  Yes Sater, Nanine Means, MD  gabapentin (NEURONTIN) 800 MG tablet Take 1 tablet (800 mg total) by mouth 3 (three) times daily. 09/06/16  Yes Sater, Nanine Means, MD  lamoTRIgine (LAMICTAL) 200 MG tablet Take 1 tablet (200 mg total) by mouth 2 (two) times daily. 08/05/16  Yes Sater, Nanine Means, MD  lisinopril (PRINIVIL,ZESTRIL) 20 MG tablet TAKE 1 TABLET BY MOUTH EVERY DAY 10/18/16  Yes Sater, Nanine Means, MD  naproxen (NAPROSYN) 375 MG tablet Take 1 tablet (375 mg total) by mouth 2 (two) times daily. Patient taking differently: Take 375 mg by mouth 2 (two) times daily as needed for mild pain.  07/15/16  Yes Mabe, Shanon Brow, NP    Oxcarbazepine (TRILEPTAL) 300 MG tablet Take 1 tablet (300 mg total) by mouth 2 (two) times daily. 11/11/15  Yes Sater, Nanine Means, MD  SUMAtriptan (IMITREX) 100 MG tablet Take 1 tablet (100 mg total) by mouth once as needed for migraine. May repeat in 2 hours if headache persists or recurs. 12/28/16  Yes Sater, Nanine Means, MD  tamsulosin (FLOMAX) 0.4 MG CAPS capsule Take 0.4 mg by mouth daily.   Yes [provider]  tiZANidine (ZANAFLEX) 4 MG tablet TAKE 1 TABLET BY MOUTH AT BEDTIME 10/18/16  Yes Sater, Nanine Means, MD  traMADol (ULTRAM-ER) 300 MG 24 hr tablet TAKE 1 TABLET BY MOUTH DAILY 12/21/16  Yes Sater, Nanine Means, MD  TRI-LO-MARZIA 0.18/0.215/0.25 MG-25 MCG tab TAKE 1 TABLET BY MOUTH DAILY. 11/11/16  Yes Woodroe Mode, MD  valACYclovir (VALTREX) 500 MG tablet Take 1 tablet (500 mg total) by mouth 2 (two) times daily. 10/25/16  Yes Sater, Nanine Means, MD  HYDROcodone-acetaminophen (NORCO/VICODIN) 5-325 MG tablet Take 1 tablet by mouth 3 (three) times daily as needed for moderate pain. Patient not taking: Reported on 12/29/2016 08/14/16   Theodis Blaze, MD  TRI-LO-MARZIA 0.18/0.215/0.25 MG-25 MCG tab TAKE 1 TABLET BY MOUTH DAILY. Patient not taking: Reported on 12/29/2016 02/05/16   Woodroe Mode, MD    Family History Family History  Problem Relation Age of Onset  . Healthy Mother   . Healthy Father   . Breast cancer Unknown        paternal grandmother dx age 63  . Colon cancer Unknown        paternal grandmother  . Hypertension Other   . Diabetes Other     Social History Social History  Substance Use Topics  . Smoking status: Never Smoker  . Smokeless tobacco: Never Used  . Alcohol use No     Allergies   Ace inhibitors and Lisinopril   Review of Systems Review of Systems  Constitutional: Negative for diaphoresis and fever.  HENT: Positive for facial swelling. Negative for drooling, trouble swallowing and voice change.   Respiratory: Negative for chest tightness and  shortness of breath.   Cardiovascular: Negative for chest pain, palpitations and leg swelling.  Skin: Negative for rash.  All other systems reviewed and are negative.    Physical Exam Updated Vital Signs BP (!) 134/98 (BP Location: Right Arm)   Pulse 96   Temp 98 F (36.7 C) (Oral)   Resp 18   LMP 11/17/2016   SpO2 100%   Physical Exam  Constitutional: She is oriented to person, place, and time. She appears well-developed and well-nourished. No distress.  HENT:  Head: Normocephalic and atraumatic.  Mouth/Throat: Oropharynx is clear and moist. No oropharyngeal exudate.  Lower lip swelling mild without swelling of the tongue or uvula or floor of the mouth  Eyes:  Pupils are equal, round, and reactive to light. Conjunctivae are normal.  Neck: Normal range of motion. Neck supple.  Cardiovascular: Normal rate, regular rhythm, normal heart sounds and intact distal pulses.   Pulmonary/Chest: Effort normal and breath sounds normal. No stridor. No respiratory distress. She has no wheezes. She has no rales.  Abdominal: Soft. Bowel sounds are normal. She exhibits no mass. There is no tenderness. There is no rebound and no guarding.  Musculoskeletal: Normal range of motion.  Neurological: She is alert and oriented to person, place, and time. She displays normal reflexes.  Skin: Skin is warm and dry. Capillary refill takes less than 2 seconds.  Psychiatric: She has a normal mood and affect.     ED Treatments / Results   Vitals:   12/29/16 0215 12/29/16 0230  BP:  (!) 134/98  Pulse: 92 96  Resp:  18  Temp:    SpO2:  100%    Radiology No results found.  Procedures Procedures (including critical care time)  Medications Ordered in ED Medications  famotidine (PEPCID) IVPB 20 mg premix (0 mg Intravenous Stopped 12/29/16 0249)  methylPREDNISolone sodium succinate (SOLU-MEDROL) 125 mg/2 mL injection 125 mg (125 mg Intravenous Given 12/29/16 0113)  diphenhydrAMINE (BENADRYL)  injection 25 mg (25 mg Intravenous Given 12/29/16 0113)    Improving post medication   Final Clinical Impressions(s) / ED Diagnoses   Strict return precautions given for  Shortness of breath, swelling or the lips or tongue, chest pain, dyspnea on exertion, new weakness or numbness changes in vision or speech,  Inability to tolerate liquids or food, changes in voice cough, altered mental status or any concerns. No signs of systemic illness or infection. The patient is nontoxic-appearing on exam and vital signs are within normal limits.    I have reviewed the triage vital signs and the nursing notes. Pertinent labs &imaging results that were available during my care of the patient were reviewed by me and considered in my medical decision making (see chart for details).  After history, exam, and medical workup I feel the patient has been appropriately medically screened and is safe for discharge home. Pertinent diagnoses were discussed with the patient. Patient was given return precautions.     Bailyn Spackman, MD 12/29/16 [phone]

## 2017-01-10 ENCOUNTER — Emergency Department (HOSPITAL_COMMUNITY)
Admission: EM | Admit: 2017-01-10 | Discharge: 2017-01-10 | Disposition: A | Discharge status: Home / Self Care | Payer: Medicare Other | Attending: Emergency Medicine | Admitting: Emergency Medicine

## 2017-01-10 ENCOUNTER — Encounter (HOSPITAL_COMMUNITY): Payer: Self-pay

## 2017-01-10 DIAGNOSIS — Z79899 Other long term (current) drug therapy: Secondary | ICD-10-CM | POA: Insufficient documentation

## 2017-01-10 DIAGNOSIS — I10 Essential (primary) hypertension: Secondary | ICD-10-CM | POA: Insufficient documentation

## 2017-01-10 DIAGNOSIS — Z96643 Presence of artificial hip joint, bilateral: Secondary | ICD-10-CM | POA: Insufficient documentation

## 2017-01-10 MED ORDER — AMLODIPINE BESYLATE 5 MG PO TABS: 5.0000 mg | ORAL_TABLET | Freq: Every day | ORAL | 0 refills | Status: DC

## 2017-01-10 MED ORDER — IBUPROFEN 200 MG PO TABS
400.0000 mg | ORAL_TABLET | Freq: Once | ORAL | Status: AC | PRN
  Administered 2017-01-10: 400 mg via ORAL
  Filled 2017-01-10: qty 2

## 2017-01-10 NOTE — ED Provider Notes (Signed)
[place] DEPT Provider Note   CSN: [id number] Arrival date & time: 01/10/17  0239     History   Chief Complaint Chief Complaint  Patient presents with  . Headache  . Hypertension    HPI Dawn Peters is a 36 y.o. female.  She is here for evaluation of headache, suspected to be from high blood pressure per the patient.  Evaluated in the ED on 12/29/16, diagnosed with ACE inhibitor angioedema, her lisinopril was subsequently discontinued.  She has not yet followed up with her primary care doctor for further evaluation, or initiation of new blood pressure medication.  She denies chest pain, cough, shortness of breath, nausea, vomiting, focal weakness or paresthesia.  She denies neck pain and back pain.  She is not currently employed.  She does not currently have a PCP.  She feels like her MS is at her baseline.  There are no other known modifying factors.  HPI  Past Medical History:  Diagnosis Date  . Anxiety   . Depression   . Hypertension   . MS (multiple sclerosis) [facility])     Patient Active Problem List   Diagnosis Date Noted  . Hydronephrosis of left kidney 10/24/2016  . Attention deficit 10/24/2016  . Left leg weakness 08/13/2016  . Chronic pain 08/13/2016  . Mild renal insufficiency 08/13/2016  . Left-sided weakness   . High risk medication use 09/15/2015  . Hip pain, bilateral 07/28/2015  . Urinary hesitancy 07/28/2015  . Multiple sclerosis exacerbation ([place]) 07/17/2015  . Urinary disorder 06/11/2015  . Gait disturbance 05/19/2015  . Numbness 05/19/2015  . Depression with anxiety 05/13/2015  . Other fatigue 05/13/2015  . Left knee pain 05/13/2015  . Avascular necrosis of bones of both hips ([place]) 05/13/2015  . Screening for HIV (human immunodeficiency virus) 07/08/2014  . Essential hypertension, benign 11/19/2013  . Anxiety state, unspecified 11/19/2013  . Screen for STD (sexually transmitted disease) 11/01/2013  .  Encounter for routine gynecological examination 11/01/2013  . Oral contraceptive use 10/20/2011  . Multiple sclerosis ([place]) 08/08/2007  . RASH AND OTHER NONSPECIFIC SKIN ERUPTION 06/22/2007  . AVASCULAR NECROSIS, FEMORAL HEAD 03/11/2006  . Essential hypertension 02/03/2006  . 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 DVT, HX OF 02/03/2006    Past Surgical History:  Procedure Laterality Date  . BILATERAL HIP ARTHROSCOPY Left 07/2013  . JOINT REPLACEMENT Bilateral     R 2004 and L 2005    OB History    Gravida Para Term Preterm AB Living   1 1 1     1    SAB TAB Ectopic Multiple Live Births           1       Home Medications    Prior to Admission medications   Medication Sig Start Date End Date Taking? Authorizing Provider  amitriptyline (ELAVIL) 25 MG tablet TAKE ONE OR TWO AT BEDTIME Patient taking differently: take 25-50mg  by mouth at bedtime 11/09/16   Sater, Nanine Means, MD  amLODipine (NORVASC) 5 MG tablet Take 1 tablet (5 mg total) daily by mouth. 01/10/17   Daleen Bo, MD  amphetamine-dextroamphetamine (ADDERALL XR) 20 MG 24 hr capsule Take 1 capsule (20 mg total) by mouth daily. 12/15/16   Sater, Nanine Means, MD  baclofen (LIORESAL) 20 MG tablet Take 1 tablet (20 mg total) by mouth 3 (three) times daily as needed (for ms). Patient taking differently: Take 20 mg by mouth 4 (four) times daily.  05/04/16   Arlice Colt  A, MD  busPIRone (BUSPAR) 15 MG tablet Take 1 tablet (15 mg total) by mouth 2 (two) times daily. 05/04/16   Sater, Nanine Means, MD  citalopram (CELEXA) 40 MG tablet Take 1 tablet (40 mg total) by mouth daily. 05/04/16   Sater, Nanine Means, MD  dalfampridine 10 MG TB12 Take 1 tablet (10 mg total) by mouth 2 (two) times daily. 08/08/16   Sater, Nanine Means, MD  gabapentin (NEURONTIN) 800 MG tablet Take 1 tablet (800 mg total) by mouth 3 (three) times daily. 09/06/16   Sater, Nanine Means, MD  HYDROcodone-acetaminophen (NORCO/VICODIN) 5-325 MG tablet Take 1 tablet by mouth 3 (three) times  daily as needed for moderate pain. Patient not taking: Reported on 12/29/2016 08/14/16   Theodis Blaze, MD  lamoTRIgine (LAMICTAL) 200 MG tablet Take 1 tablet (200 mg total) by mouth 2 (two) times daily. 08/05/16   Sater, Nanine Means, MD  lisinopril (PRINIVIL,ZESTRIL) 20 MG tablet TAKE 1 TABLET BY MOUTH EVERY DAY 10/18/16   Sater, Nanine Means, MD  naproxen (NAPROSYN) 375 MG tablet Take 1 tablet (375 mg total) by mouth 2 (two) times daily. Patient taking differently: Take 375 mg by mouth 2 (two) times daily as needed for mild pain.  07/15/16   Janne Napoleon, NP  Oxcarbazepine (TRILEPTAL) 300 MG tablet Take 1 tablet (300 mg total) by mouth 2 (two) times daily. 11/11/15   Sater, Nanine Means, MD  SUMAtriptan (IMITREX) 100 MG tablet Take 1 tablet (100 mg total) by mouth once as needed for migraine. May repeat in 2 hours if headache persists or recurs. 12/28/16   Sater, Nanine Means, MD  tamsulosin (FLOMAX) 0.4 MG CAPS capsule Take 0.4 mg by mouth daily.    [provider]  tiZANidine (ZANAFLEX) 4 MG tablet TAKE 1 TABLET BY MOUTH AT BEDTIME 10/18/16   Sater, Nanine Means, MD  traMADol (ULTRAM-ER) 300 MG 24 hr tablet TAKE 1 TABLET BY MOUTH DAILY 12/21/16   Sater, Nanine Means, MD  TRI-LO-MARZIA 0.18/0.215/0.25 MG-25 MCG tab TAKE 1 TABLET BY MOUTH DAILY. Patient not taking: Reported on 12/29/2016 02/05/16   Woodroe Mode, MD  TRI-LO-MARZIA 0.18/0.215/0.25 MG-25 MCG tab TAKE 1 TABLET BY MOUTH DAILY. 11/11/16   Woodroe Mode, MD  valACYclovir (VALTREX) 500 MG tablet Take 1 tablet (500 mg total) by mouth 2 (two) times daily. 10/25/16   Sater, Nanine Means, MD    Family History Family History  Problem Relation Age of Onset  . Healthy Mother   . Healthy Father   . Breast cancer Unknown        paternal grandmother dx age 29  . Colon cancer Unknown        paternal grandmother  . Hypertension Other   . Diabetes Other     Social History Social History   Tobacco Use  . Smoking status: Never Smoker  . Smokeless  tobacco: Never Used  Substance Use Topics  . Alcohol use: No  . Drug use: No     Allergies   Ace inhibitors and Lisinopril   Review of Systems Review of Systems  All other systems reviewed and are negative.    Physical Exam Updated Vital Signs BP (!) 150/106   Pulse 86   Temp 98.2 F (36.8 C) (Oral)   Resp 16   Ht 5\' 5"  (1.651 m)   Wt 88.9 kg (196 lb)   LMP 01/04/2017   SpO2 100%   BMI 32.62 kg/m   Physical Exam  Constitutional: She is  oriented to person, place, and time. She appears well-developed and well-nourished.  HENT:  Head: Normocephalic and atraumatic.  Eyes: Conjunctivae and EOM are normal. Pupils are equal, round, and reactive to light.  Neck: Normal range of motion and phonation normal. Neck supple.  Cardiovascular: Normal rate and regular rhythm.  Pulmonary/Chest: Effort normal and breath sounds normal. She exhibits no tenderness.  Abdominal: Soft. She exhibits no distension. There is no tenderness. There is no guarding.  Musculoskeletal: Normal range of motion.  Neurological: She is alert and oriented to person, place, and time. She exhibits normal muscle tone. GCS eye subscore is 4. GCS verbal subscore is 5. GCS motor subscore is 6.  Skin: Skin is warm and dry.  Psychiatric: She has a normal mood and affect. Her behavior is normal. Judgment and thought content normal.  Nursing note and vitals reviewed.    ED Treatments / Results  Labs (all labs ordered are listed, but only abnormal results are displayed) Labs Reviewed - No data to display  EKG  EKG Interpretation None       Radiology No results found.  Procedures Procedures (including critical care time)  Medications Ordered in ED Medications  ibuprofen (ADVIL,MOTRIN) tablet 400 mg (400 mg Oral Given 01/10/17 0354)     Initial Impression / Assessment and Plan / ED Course  I have reviewed the triage vital signs and the nursing notes.  Pertinent labs & imaging results that were  available during my care of the patient were reviewed by me and considered in my medical decision making (see chart for details).      Patient Vitals for the past 24 hrs:  BP Temp Temp src Pulse Resp SpO2 Height Weight  01/10/17 0758 (!) 150/106 - - - - - - -  01/10/17 0350 - - - - - - 5\' 5"  (1.651 m) 88.9 kg (196 lb)  01/10/17 0349 (!) 150/111 98.2 F (36.8 C) Oral 86 16 100 % - -    8:32 AM Reevaluation with update and discussion. After initial assessment and treatment, an updated evaluation reveals no change in clinical status.  Findings discussed with patient, all questions answered. Ninoshka Wainwright L      Final Clinical Impressions(s) / ED Diagnoses   Final diagnoses:  Hypertension, unspecified type    Headache with high blood pressure.  Hypertensive urgency, metabolic instability or impending vascular collapse.  No evidence for CVA, TIA, or unstable CNS disorder.  Norvasc prescription given.  Nursing Notes Reviewed/ Care Coordinated Applicable Imaging Reviewed Interpretation of Laboratory Data incorporated into ED treatment  The patient appears reasonably screened and/or stabilized for discharge and I doubt any other medical condition or other [facility] requiring further screening, evaluation, or treatment in the ED at this time prior to discharge.  Plan: Home Medications-OTC analgesia as needed; Home Treatments-low-salt diet; return here if the recommended treatment, does not improve the symptoms; Recommended follow up-PCP follow-up 1-2 weeks for evaluation and treatment.   ED Discharge Orders        Ordered    amLODipine (NORVASC) 5 MG tablet  Daily     01/10/17 0829       Daleen Bo, MD 01/10/17 [phone]

## 2017-01-10 NOTE — Discharge Instructions (Signed)
Start taking the new medication for blood pressure, today.  This medication may not work so make sure you follow-up with a primary care doctor in the next week or 2 for further evaluation and treatment.  Use the attached resource guide if needed to find a primary care doctor.

## 2017-01-10 NOTE — ED Triage Notes (Signed)
Per EMS, Pt, from home, c/o headache x 1 day.  Pain score 10/10.  Pt reports she think the headache is due to HTN.  Sts she was taken off her medication around 2 weeks ago.  Pt has not taken anything for pain.

## 2017-01-24 ENCOUNTER — Telehealth: Payer: Self-pay | Admitting: Neurology

## 2017-01-24 NOTE — Telephone Encounter (Signed)
Patient states a nurse usually comes to her home once a month to take her blood but has not come this month. Do you know who she works for? Her name is Dawn Peters.

## 2017-01-24 NOTE — Telephone Encounter (Signed)
Spoke with Rayme.  Her October [place] labs were drawn on 10/19.  Due to the holidays, November labs may be off by a couple of days.  She will let me know if the lab tech has not drawn her labs next wk./fim

## 2017-01-26 ENCOUNTER — Other Ambulatory Visit: Payer: Self-pay | Admitting: Neurology

## 2017-02-03 ENCOUNTER — Encounter: Payer: Self-pay | Admitting: Neurology

## 2017-02-20 ENCOUNTER — Other Ambulatory Visit: Payer: Self-pay | Admitting: Neurology

## 2017-02-22 ENCOUNTER — Telehealth: Payer: Self-pay | Admitting: Neurology

## 2017-02-22 MED ORDER — AMPHETAMINE-DEXTROAMPHET ER 20 MG PO CP24: 20.0000 mg | ORAL_CAPSULE | Freq: Every day | ORAL | 0 refills | Status: DC

## 2017-02-22 NOTE — Telephone Encounter (Signed)
Adderall rx. up front [facility]/fim 

## 2017-02-22 NOTE — Telephone Encounter (Signed)
Rx. awaiting RAS sig/fim 

## 2017-02-22 NOTE — Telephone Encounter (Signed)
Pt request refill for amphetamine-dextroamphetamine (ADDERALL XR) 20 MG 24 hr capsule. Pt is out of the medication and has been advised to call a couple of days in advance to request refills. She would like to pick up med today if possible. She was appreciative

## 2017-03-16 ENCOUNTER — Telehealth: Payer: Self-pay | Admitting: Neurology

## 2017-03-16 NOTE — Telephone Encounter (Signed)
LMOM (identified vm) that she only needed to complete the 3 mos. of Valcyclovir after the [place].  Please call back if she has other questions/fim

## 2017-03-16 NOTE — Telephone Encounter (Signed)
Attempted to contact pt. but received message that her mailbox is full./fim

## 2017-03-16 NOTE — Telephone Encounter (Signed)
Pt has returned the call to [place], the message was read to her from RN Faith's previous entry at 1:41pm, pt is still asking to speak with Faith.  Please call

## 2017-03-16 NOTE — Telephone Encounter (Signed)
Pt request refill for valACYclovir (VALTREX) 500 MG tablet sent to [facility]. Pt is out of the medication. [facility] said [facility]

## 2017-03-20 MED ORDER — VALACYCLOVIR HCL 500 MG PO TABS: 500.0000 mg | ORAL_TABLET | Freq: Two times a day (BID) | ORAL | 2 refills | Status: DC

## 2017-03-20 NOTE — Telephone Encounter (Signed)
Valtrex escribed per pt's request/fim

## 2017-03-20 NOTE — Addendum Note (Signed)
Addended by: France Ravens I on: 03/20/2017 11:57 AM   Modules accepted: Orders

## 2017-03-20 NOTE — Telephone Encounter (Signed)
Pt is retuning call and wanting to have  valACYclovir (VALTREX) 500 MG tablet  filled sent to [facility]

## 2017-04-03 ENCOUNTER — Telehealth: Payer: Self-pay | Admitting: Neurology

## 2017-04-03 NOTE — Telephone Encounter (Signed)
Pt is asking for a call with the lab results when available

## 2017-04-04 NOTE — Telephone Encounter (Signed)
Dr. Felecia Shelling has reviewed her labs from December 2018 and January 2019.  Her TSH was low in December but was back to normal in January.  No concerns.  Spoke to patient and she is aware.

## 2017-04-07 DIAGNOSIS — Z8669 Personal history of other diseases of the nervous system and sense organs: Secondary | ICD-10-CM

## 2017-04-07 HISTORY — DX: Personal history of other diseases of the nervous system and sense organs: Z86.69

## 2017-04-18 ENCOUNTER — Telehealth: Payer: Self-pay | Admitting: Neurology

## 2017-04-18 NOTE — Telephone Encounter (Signed)
Pt would like to get a prescription for Prednisone, pt states she is dragging her left leg.  Please call

## 2017-04-19 ENCOUNTER — Encounter (HOSPITAL_COMMUNITY): Payer: Self-pay | Admitting: Emergency Medicine

## 2017-04-19 ENCOUNTER — Telehealth: Payer: Self-pay | Admitting: Neurology

## 2017-04-19 ENCOUNTER — Inpatient Hospital Stay (HOSPITAL_COMMUNITY)
Admission: EM | Admit: 2017-04-19 | Discharge: 2017-04-24 | DRG: 058 | Disposition: A | Discharge status: Home Health | Payer: Medicare Other | Attending: Internal Medicine | Admitting: Internal Medicine

## 2017-04-19 DIAGNOSIS — Z79899 Other long term (current) drug therapy: Secondary | ICD-10-CM

## 2017-04-19 DIAGNOSIS — N183 Chronic kidney disease, stage 3 (moderate): Secondary | ICD-10-CM | POA: Diagnosis present

## 2017-04-19 DIAGNOSIS — R000 Tachycardia, unspecified: Secondary | ICD-10-CM | POA: Diagnosis present

## 2017-04-19 DIAGNOSIS — Z86718 Personal history of other venous thrombosis and embolism: Secondary | ICD-10-CM

## 2017-04-19 DIAGNOSIS — I129 Hypertensive chronic kidney disease with stage 1 through stage 4 chronic kidney disease, or unspecified chronic kidney disease: Secondary | ICD-10-CM | POA: Diagnosis present

## 2017-04-19 DIAGNOSIS — R4701 Aphasia: Secondary | ICD-10-CM | POA: Diagnosis present

## 2017-04-19 DIAGNOSIS — R402242 Coma scale, best verbal response, confused conversation, at arrival to emergency department: Secondary | ICD-10-CM | POA: Diagnosis present

## 2017-04-19 DIAGNOSIS — F411 Generalized anxiety disorder: Secondary | ICD-10-CM | POA: Diagnosis present

## 2017-04-19 DIAGNOSIS — Z96643 Presence of artificial hip joint, bilateral: Secondary | ICD-10-CM | POA: Diagnosis present

## 2017-04-19 DIAGNOSIS — F329 Major depressive disorder, single episode, unspecified: Secondary | ICD-10-CM | POA: Diagnosis present

## 2017-04-19 DIAGNOSIS — Z79891 Long term (current) use of opiate analgesic: Secondary | ICD-10-CM

## 2017-04-19 DIAGNOSIS — R402352 Coma scale, best motor response, localizes pain, at arrival to emergency department: Secondary | ICD-10-CM | POA: Diagnosis present

## 2017-04-19 DIAGNOSIS — Z888 Allergy status to other drugs, medicaments and biological substances status: Secondary | ICD-10-CM

## 2017-04-19 DIAGNOSIS — Z791 Long term (current) use of non-steroidal anti-inflammatories (NSAID): Secondary | ICD-10-CM

## 2017-04-19 DIAGNOSIS — Z8249 Family history of ischemic heart disease and other diseases of the circulatory system: Secondary | ICD-10-CM

## 2017-04-19 DIAGNOSIS — G9341 Metabolic encephalopathy: Secondary | ICD-10-CM | POA: Diagnosis present

## 2017-04-19 DIAGNOSIS — T40601A Poisoning by unspecified narcotics, accidental (unintentional), initial encounter: Secondary | ICD-10-CM | POA: Diagnosis present

## 2017-04-19 DIAGNOSIS — G9340 Encephalopathy, unspecified: Secondary | ICD-10-CM | POA: Diagnosis present

## 2017-04-19 DIAGNOSIS — Y92009 Unspecified place in unspecified non-institutional (private) residence as the place of occurrence of the external cause: Secondary | ICD-10-CM

## 2017-04-19 DIAGNOSIS — N179 Acute kidney failure, unspecified: Secondary | ICD-10-CM | POA: Diagnosis present

## 2017-04-19 DIAGNOSIS — R451 Restlessness and agitation: Secondary | ICD-10-CM

## 2017-04-19 DIAGNOSIS — E876 Hypokalemia: Secondary | ICD-10-CM | POA: Diagnosis present

## 2017-04-19 DIAGNOSIS — M21372 Foot drop, left foot: Secondary | ICD-10-CM | POA: Diagnosis present

## 2017-04-19 DIAGNOSIS — Z800 Family history of malignant neoplasm of digestive organs: Secondary | ICD-10-CM

## 2017-04-19 DIAGNOSIS — Z803 Family history of malignant neoplasm of breast: Secondary | ICD-10-CM

## 2017-04-19 DIAGNOSIS — D72829 Elevated white blood cell count, unspecified: Secondary | ICD-10-CM | POA: Diagnosis present

## 2017-04-19 DIAGNOSIS — G35 Multiple sclerosis: Principal | ICD-10-CM | POA: Diagnosis present

## 2017-04-19 DIAGNOSIS — R402142 Coma scale, eyes open, spontaneous, at arrival to emergency department: Secondary | ICD-10-CM | POA: Diagnosis present

## 2017-04-19 DIAGNOSIS — Z833 Family history of diabetes mellitus: Secondary | ICD-10-CM

## 2017-04-19 MED ORDER — AMPHETAMINE-DEXTROAMPHET ER 20 MG PO CP24: 20.0000 mg | ORAL_CAPSULE | Freq: Every day | ORAL | 0 refills | Status: DC

## 2017-04-19 MED ORDER — METHYLPREDNISOLONE 4 MG PO TBPK: ORAL_TABLET | ORAL | 0 refills | Status: DC

## 2017-04-19 MED ORDER — LORAZEPAM 2 MG/ML IJ SOLN
INTRAMUSCULAR | Status: AC
  Administered 2017-04-20: 1 mg
  Filled 2017-04-19: qty 1

## 2017-04-19 NOTE — ED Notes (Signed)
Attempt x 2 for peripheral IV-patient has bare response to noxious/painful stimuli. Patient had one low moan. No purposeful movement. VSS with stable O2 sat on RA-warm blankets for rectal temp 96.8

## 2017-04-19 NOTE — ED Triage Notes (Signed)
Patient BIB [facility] from home. Patient found to be unresponsive after getting pain injection from dr. Gabriel Carina. Medication unknown. Pt given 2mg  IN narcan. Patiens respirations increased but pt still difficult to arouse, pinpoint pupils.

## 2017-04-19 NOTE — ED Notes (Signed)
Bed: [facility]88 Expected date:  Expected time:  Means of arrival:  Comments: EMS pain injection at primary office-pinpoint pupils Narcan

## 2017-04-19 NOTE — Telephone Encounter (Signed)
Per RAS, ok for Medrol dose pk, and appt. if sx. persist or return w/i the next few wks.  Pt. agreeable.  [facility]/fim

## 2017-04-19 NOTE — Addendum Note (Signed)
Addended by: France Ravens I on: 04/19/2017 09:23 AM   Modules accepted: Orders

## 2017-04-19 NOTE — Telephone Encounter (Signed)
Patient requesting refill of amphetamine-dextroamphetamine (ADDERALL XR) 20 MG 24 hr capsule.

## 2017-04-19 NOTE — Telephone Encounter (Signed)
Adderall rx. up front [facility]/fim 

## 2017-04-19 NOTE — Telephone Encounter (Signed)
Rx. awaiting RAS sig/fim 

## 2017-04-19 NOTE — Addendum Note (Signed)
Addended by: France Ravens I on: 04/19/2017 09:42 AM   Modules accepted: Orders

## 2017-04-20 ENCOUNTER — Emergency Department (HOSPITAL_COMMUNITY): Payer: Medicare Other

## 2017-04-20 ENCOUNTER — Inpatient Hospital Stay (HOSPITAL_COMMUNITY): Payer: Medicare Other

## 2017-04-20 ENCOUNTER — Other Ambulatory Visit: Payer: Self-pay

## 2017-04-20 ENCOUNTER — Encounter (HOSPITAL_COMMUNITY): Payer: Self-pay | Admitting: Emergency Medicine

## 2017-04-20 DIAGNOSIS — F329 Major depressive disorder, single episode, unspecified: Secondary | ICD-10-CM | POA: Diagnosis present

## 2017-04-20 DIAGNOSIS — F411 Generalized anxiety disorder: Secondary | ICD-10-CM | POA: Diagnosis present

## 2017-04-20 DIAGNOSIS — G9341 Metabolic encephalopathy: Secondary | ICD-10-CM | POA: Diagnosis present

## 2017-04-20 DIAGNOSIS — Z800 Family history of malignant neoplasm of digestive organs: Secondary | ICD-10-CM | POA: Diagnosis not present

## 2017-04-20 DIAGNOSIS — G9340 Encephalopathy, unspecified: Secondary | ICD-10-CM

## 2017-04-20 DIAGNOSIS — T40601A Poisoning by unspecified narcotics, accidental (unintentional), initial encounter: Secondary | ICD-10-CM | POA: Diagnosis present

## 2017-04-20 DIAGNOSIS — Z96643 Presence of artificial hip joint, bilateral: Secondary | ICD-10-CM | POA: Diagnosis present

## 2017-04-20 DIAGNOSIS — R000 Tachycardia, unspecified: Secondary | ICD-10-CM | POA: Diagnosis present

## 2017-04-20 DIAGNOSIS — Y92009 Unspecified place in unspecified non-institutional (private) residence as the place of occurrence of the external cause: Secondary | ICD-10-CM | POA: Diagnosis not present

## 2017-04-20 DIAGNOSIS — Z888 Allergy status to other drugs, medicaments and biological substances status: Secondary | ICD-10-CM | POA: Diagnosis not present

## 2017-04-20 DIAGNOSIS — D72829 Elevated white blood cell count, unspecified: Secondary | ICD-10-CM | POA: Diagnosis present

## 2017-04-20 DIAGNOSIS — N189 Chronic kidney disease, unspecified: Secondary | ICD-10-CM | POA: Diagnosis not present

## 2017-04-20 DIAGNOSIS — G35 Multiple sclerosis: Secondary | ICD-10-CM | POA: Diagnosis present

## 2017-04-20 DIAGNOSIS — Z833 Family history of diabetes mellitus: Secondary | ICD-10-CM | POA: Diagnosis not present

## 2017-04-20 DIAGNOSIS — Z8249 Family history of ischemic heart disease and other diseases of the circulatory system: Secondary | ICD-10-CM | POA: Diagnosis not present

## 2017-04-20 DIAGNOSIS — Z803 Family history of malignant neoplasm of breast: Secondary | ICD-10-CM | POA: Diagnosis not present

## 2017-04-20 DIAGNOSIS — Z79891 Long term (current) use of opiate analgesic: Secondary | ICD-10-CM | POA: Diagnosis not present

## 2017-04-20 DIAGNOSIS — Z86718 Personal history of other venous thrombosis and embolism: Secondary | ICD-10-CM | POA: Diagnosis not present

## 2017-04-20 DIAGNOSIS — R4701 Aphasia: Secondary | ICD-10-CM

## 2017-04-20 DIAGNOSIS — M21372 Foot drop, left foot: Secondary | ICD-10-CM | POA: Diagnosis present

## 2017-04-20 DIAGNOSIS — Z791 Long term (current) use of non-steroidal anti-inflammatories (NSAID): Secondary | ICD-10-CM | POA: Diagnosis not present

## 2017-04-20 DIAGNOSIS — N183 Chronic kidney disease, stage 3 (moderate): Secondary | ICD-10-CM | POA: Diagnosis present

## 2017-04-20 DIAGNOSIS — I129 Hypertensive chronic kidney disease with stage 1 through stage 4 chronic kidney disease, or unspecified chronic kidney disease: Secondary | ICD-10-CM | POA: Diagnosis present

## 2017-04-20 DIAGNOSIS — I10 Essential (primary) hypertension: Secondary | ICD-10-CM | POA: Diagnosis not present

## 2017-04-20 DIAGNOSIS — N179 Acute kidney failure, unspecified: Secondary | ICD-10-CM

## 2017-04-20 DIAGNOSIS — Z79899 Other long term (current) drug therapy: Secondary | ICD-10-CM | POA: Diagnosis not present

## 2017-04-20 DIAGNOSIS — R402142 Coma scale, eyes open, spontaneous, at arrival to emergency department: Secondary | ICD-10-CM | POA: Diagnosis present

## 2017-04-20 DIAGNOSIS — E876 Hypokalemia: Secondary | ICD-10-CM | POA: Diagnosis present

## 2017-04-20 LAB — BASIC METABOLIC PANEL
Anion gap: 9 (ref 5–15)
BUN: 17 mg/dL (ref 6–20)
CHLORIDE: 108 mmol/L (ref 101–111)
CO2: 25 mmol/L (ref 22–32)
CREATININE: 1.45 mg/dL — AB (ref 0.44–1.00)
Calcium: 9.1 mg/dL (ref 8.9–10.3)
GFR calc Af Amer: 52 mL/min — ABNORMAL LOW (ref >60)
GFR calc non Af Amer: 45 mL/min — ABNORMAL LOW (ref >60)
GLUCOSE: 116 mg/dL — AB (ref 65–99)
POTASSIUM: 4.7 mmol/L (ref 3.5–5.1)
SODIUM: 142 mmol/L (ref 135–145)

## 2017-04-20 LAB — I-STAT CHEM 8, ED
BUN: 18 mg/dL (ref 6–20)
Calcium, Ion: 1.14 mmol/L — ABNORMAL LOW (ref 1.15–1.40)
Chloride: 107 mmol/L (ref 101–111)
Creatinine, Ser: 1.6 mg/dL — ABNORMAL HIGH (ref 0.44–1.00)
Glucose, Bld: 126 mg/dL — ABNORMAL HIGH (ref 65–99)
HCT: 38 % (ref 36.0–46.0)
Hemoglobin: 12.9 g/dL (ref 12.0–15.0)
Potassium: 4.2 mmol/L (ref 3.5–5.1)
SODIUM: 142 mmol/L (ref 135–145)
TCO2: 27 mmol/L (ref 22–32)

## 2017-04-20 LAB — COMPREHENSIVE METABOLIC PANEL
ALK PHOS: 95 U/L (ref 38–126)
ALT: 12 U/L — ABNORMAL LOW (ref 14–54)
ANION GAP: 10 (ref 5–15)
AST: 14 U/L — ABNORMAL LOW (ref 15–41)
Albumin: 3.9 g/dL (ref 3.5–5.0)
BILIRUBIN TOTAL: 0.3 mg/dL (ref 0.3–1.2)
BUN: 19 mg/dL (ref 6–20)
CALCIUM: 8.8 mg/dL — AB (ref 8.9–10.3)
CO2: 25 mmol/L (ref 22–32)
CREATININE: 1.66 mg/dL — AB (ref 0.44–1.00)
Chloride: 108 mmol/L (ref 101–111)
GFR calc non Af Amer: 38 mL/min — ABNORMAL LOW (ref >60)
GFR, EST AFRICAN AMERICAN: 44 mL/min — AB (ref >60)
Glucose, Bld: 131 mg/dL — ABNORMAL HIGH (ref 65–99)
Potassium: 4.2 mmol/L (ref 3.5–5.1)
Sodium: 143 mmol/L (ref 135–145)
Total Protein: 7.4 g/dL (ref 6.5–8.1)

## 2017-04-20 LAB — HEMOGLOBIN A1C
Hgb A1c MFr Bld: 5.3 % (ref 4.8–5.6)
Mean Plasma Glucose: 105.41 mg/dL

## 2017-04-20 LAB — DIFFERENTIAL
Basophils Absolute: 0 10*3/uL (ref 0.0–0.1)
Basophils Relative: 0 %
EOS PCT: 0 %
Eosinophils Absolute: 0 10*3/uL (ref 0.0–0.7)
LYMPHS ABS: 0.3 10*3/uL — AB (ref 0.7–4.0)
LYMPHS PCT: 4 %
MONO ABS: 0 10*3/uL — AB (ref 0.1–1.0)
Monocytes Relative: 0 %
Neutro Abs: 8.6 10*3/uL — ABNORMAL HIGH (ref 1.7–7.7)
Neutrophils Relative %: 96 %

## 2017-04-20 LAB — I-STAT TROPONIN, ED: Troponin i, poc: 0 ng/mL (ref 0.00–0.08)

## 2017-04-20 LAB — RAPID URINE DRUG SCREEN, HOSP PERFORMED
Amphetamines: NOT DETECTED
Barbiturates: NOT DETECTED
Benzodiazepines: NOT DETECTED
Cocaine: NOT DETECTED
OPIATES: NOT DETECTED
TETRAHYDROCANNABINOL: NOT DETECTED

## 2017-04-20 LAB — URINALYSIS, ROUTINE W REFLEX MICROSCOPIC
BACTERIA UA: NONE SEEN
BILIRUBIN URINE: NEGATIVE
GLUCOSE, UA: NEGATIVE mg/dL
Ketones, ur: NEGATIVE mg/dL
LEUKOCYTES UA: NEGATIVE
NITRITE: NEGATIVE
Protein, ur: NEGATIVE mg/dL
SPECIFIC GRAVITY, URINE: 1.006 (ref 1.005–1.030)
pH: 6 (ref 5.0–8.0)

## 2017-04-20 LAB — CBC
HEMATOCRIT: 36.2 % (ref 36.0–46.0)
HEMOGLOBIN: 12.3 g/dL (ref 12.0–15.0)
MCH: 32.4 pg (ref 26.0–34.0)
MCHC: 34 g/dL (ref 30.0–36.0)
MCV: 95.3 fL (ref 78.0–100.0)
PLATELETS: 256 10*3/uL (ref 150–400)
RBC: 3.8 MIL/uL — AB (ref 3.87–5.11)
RDW: 15.3 % (ref 11.5–15.5)
WBC: 9 10*3/uL (ref 4.0–10.5)

## 2017-04-20 LAB — LIPID PANEL
CHOL/HDL RATIO: 3 ratio
Cholesterol: 228 mg/dL — ABNORMAL HIGH (ref 0–200)
HDL: 77 mg/dL (ref >40)
LDL Cholesterol: 142 mg/dL — ABNORMAL HIGH (ref 0–99)
TRIGLYCERIDES: 46 mg/dL (ref <150)
VLDL: 9 mg/dL (ref 0–40)

## 2017-04-20 LAB — APTT: aPTT: 23 seconds — ABNORMAL LOW (ref 24–36)

## 2017-04-20 LAB — I-STAT BETA HCG BLOOD, ED (MC, WL, AP ONLY)

## 2017-04-20 LAB — ETHANOL: Alcohol, Ethyl (B): <10 mg/dL (ref <10)

## 2017-04-20 LAB — PROTIME-INR
INR: 0.95
PROTHROMBIN TIME: 12.6 s (ref 11.4–15.2)

## 2017-04-20 LAB — HIV ANTIBODY (ROUTINE TESTING W REFLEX): HIV Screen 4th Generation wRfx: NONREACTIVE

## 2017-04-20 MED ORDER — AMPHETAMINE-DEXTROAMPHET ER 10 MG PO CP24
20.0000 mg | ORAL_CAPSULE | Freq: Every day | ORAL | Status: DC
  Administered 2017-04-21 – 2017-04-24 (×4): 20 mg via ORAL
  Filled 2017-04-20 (×4): qty 2

## 2017-04-20 MED ORDER — TAMSULOSIN HCL 0.4 MG PO CAPS
0.4000 mg | ORAL_CAPSULE | Freq: Every day | ORAL | Status: DC
  Administered 2017-04-21 – 2017-04-24 (×4): 0.4 mg via ORAL
  Filled 2017-04-20 (×4): qty 1

## 2017-04-20 MED ORDER — HYDRALAZINE HCL 20 MG/ML IJ SOLN
10.0000 mg | INTRAMUSCULAR | Status: DC | PRN
  Administered 2017-04-21: 10 mg via INTRAVENOUS
  Filled 2017-04-20: qty 1

## 2017-04-20 MED ORDER — SODIUM CHLORIDE 0.9 % IV SOLN
1.0000 g | Freq: Once | INTRAVENOUS | Status: AC
  Administered 2017-04-20: 1 g via INTRAVENOUS
  Filled 2017-04-20: qty 10

## 2017-04-20 MED ORDER — BACLOFEN 10 MG PO TABS: 20.0000 mg | ORAL_TABLET | Freq: Three times a day (TID) | ORAL | Status: DC | PRN

## 2017-04-20 MED ORDER — ORAL CARE MOUTH RINSE
15.0000 mL | Freq: Two times a day (BID) | OROMUCOSAL | Status: DC
  Administered 2017-04-21: 15 mL via OROMUCOSAL

## 2017-04-20 MED ORDER — AMITRIPTYLINE HCL 25 MG PO TABS
25.0000 mg | ORAL_TABLET | Freq: Every day | ORAL | Status: DC
  Administered 2017-04-20 – 2017-04-23 (×4): 25 mg via ORAL
  Filled 2017-04-20 (×4): qty 1

## 2017-04-20 MED ORDER — ENOXAPARIN SODIUM 40 MG/0.4ML ~~LOC~~ SOLN
40.0000 mg | SUBCUTANEOUS | Status: DC
  Administered 2017-04-20 – 2017-04-24 (×5): 40 mg via SUBCUTANEOUS
  Filled 2017-04-20 (×5): qty 0.4

## 2017-04-20 MED ORDER — LORAZEPAM 2 MG/ML IJ SOLN
0.5000 mg | Freq: Once | INTRAMUSCULAR | Status: AC
  Administered 2017-04-20: 0.5 mg via INTRAVENOUS

## 2017-04-20 MED ORDER — OXCARBAZEPINE 300 MG PO TABS
300.0000 mg | ORAL_TABLET | Freq: Two times a day (BID) | ORAL | Status: DC
  Administered 2017-04-20 – 2017-04-24 (×8): 300 mg via ORAL
  Filled 2017-04-20 (×9): qty 1

## 2017-04-20 MED ORDER — DALFAMPRIDINE ER 10 MG PO TB12
10.0000 mg | ORAL_TABLET | Freq: Two times a day (BID) | ORAL | Status: DC
  Administered 2017-04-21: 10 mg via ORAL

## 2017-04-20 MED ORDER — IOPAMIDOL (ISOVUE-370) INJECTION 76%
100.0000 mL | Freq: Once | INTRAVENOUS | Status: AC | PRN
  Administered 2017-04-20: 100 mL via INTRAVENOUS

## 2017-04-20 MED ORDER — BUSPIRONE HCL 5 MG PO TABS
15.0000 mg | ORAL_TABLET | Freq: Two times a day (BID) | ORAL | Status: DC
  Administered 2017-04-20 – 2017-04-24 (×8): 15 mg via ORAL
  Filled 2017-04-20 (×8): qty 3

## 2017-04-20 MED ORDER — VALACYCLOVIR HCL 500 MG PO TABS
500.0000 mg | ORAL_TABLET | Freq: Two times a day (BID) | ORAL | Status: DC
  Administered 2017-04-20 – 2017-04-24 (×8): 500 mg via ORAL
  Filled 2017-04-20 (×9): qty 1

## 2017-04-20 MED ORDER — ACETAMINOPHEN 650 MG RE SUPP
650.0000 mg | RECTAL | Status: DC | PRN
Start: 2017-04-20 — End: 2017-04-24

## 2017-04-20 MED ORDER — OXYCODONE-ACETAMINOPHEN 5-325 MG PO TABS
1.0000 | ORAL_TABLET | Freq: Four times a day (QID) | ORAL | Status: DC
Start: 2017-04-20 — End: 2017-04-24
  Administered 2017-04-20 – 2017-04-24 (×14): 1 via ORAL
  Filled 2017-04-20 (×14): qty 1

## 2017-04-20 MED ORDER — SODIUM CHLORIDE 0.9 % IV SOLN
1000.0000 mg | Freq: Every day | INTRAVENOUS | Status: AC
  Administered 2017-04-20 – 2017-04-22 (×3): 1000 mg via INTRAVENOUS
  Filled 2017-04-20 (×3): qty 8

## 2017-04-20 MED ORDER — STROKE: EARLY STAGES OF RECOVERY BOOK
Freq: Once | Status: DC
  Filled 2017-04-20: qty 1

## 2017-04-20 MED ORDER — OXYCODONE HCL 5 MG PO TABS: 5.0000 mg | ORAL_TABLET | Freq: Once | ORAL | Status: DC

## 2017-04-20 MED ORDER — AMLODIPINE BESYLATE 5 MG PO TABS
5.0000 mg | ORAL_TABLET | Freq: Every day | ORAL | Status: DC
  Administered 2017-04-20 – 2017-04-24 (×5): 5 mg via ORAL
  Filled 2017-04-20 (×5): qty 1

## 2017-04-20 MED ORDER — LORAZEPAM 2 MG/ML IJ SOLN
0.5000 mg | Freq: Once | INTRAMUSCULAR | Status: DC
  Filled 2017-04-20: qty 1

## 2017-04-20 MED ORDER — ACETAMINOPHEN 160 MG/5ML PO SOLN
650.0000 mg | ORAL | Status: DC | PRN
Start: 2017-04-20 — End: 2017-04-24

## 2017-04-20 MED ORDER — SODIUM CHLORIDE 0.9 % IV SOLN
INTRAVENOUS | Status: DC
  Administered 2017-04-20 – 2017-04-22 (×4): via INTRAVENOUS

## 2017-04-20 MED ORDER — SENNOSIDES-DOCUSATE SODIUM 8.6-50 MG PO TABS: 1.0000 | ORAL_TABLET | Freq: Every evening | ORAL | Status: DC | PRN

## 2017-04-20 MED ORDER — NAPROXEN 375 MG PO TABS: 375.0000 mg | ORAL_TABLET | Freq: Two times a day (BID) | ORAL | Status: DC

## 2017-04-20 MED ORDER — CHLORHEXIDINE GLUCONATE 0.12 % MT SOLN
15.0000 mL | Freq: Two times a day (BID) | OROMUCOSAL | Status: DC
  Administered 2017-04-20 – 2017-04-23 (×6): 15 mL via OROMUCOSAL
  Filled 2017-04-20 (×7): qty 15

## 2017-04-20 MED ORDER — ACETAMINOPHEN 325 MG PO TABS
650.0000 mg | ORAL_TABLET | ORAL | Status: DC | PRN
  Administered 2017-04-21: 650 mg via ORAL
  Filled 2017-04-20: qty 2

## 2017-04-20 MED ORDER — CITALOPRAM HYDROBROMIDE 20 MG PO TABS
40.0000 mg | ORAL_TABLET | Freq: Every day | ORAL | Status: DC
  Administered 2017-04-21 – 2017-04-24 (×4): 40 mg via ORAL
  Filled 2017-04-20 (×4): qty 2

## 2017-04-20 MED ORDER — IOPAMIDOL (ISOVUE-370) INJECTION 76%
INTRAVENOUS | Status: AC
  Administered 2017-04-20: 100 mL via INTRAVENOUS
  Filled 2017-04-20: qty 100

## 2017-04-20 MED ORDER — TIZANIDINE HCL 4 MG PO TABS
4.0000 mg | ORAL_TABLET | Freq: Every day | ORAL | Status: DC
  Administered 2017-04-20 – 2017-04-23 (×4): 4 mg via ORAL
  Filled 2017-04-20 (×4): qty 1

## 2017-04-20 MED ORDER — GABAPENTIN 400 MG PO CAPS
800.0000 mg | ORAL_CAPSULE | Freq: Three times a day (TID) | ORAL | Status: DC
  Administered 2017-04-20 – 2017-04-24 (×11): 800 mg via ORAL
  Filled 2017-04-20 (×11): qty 2

## 2017-04-20 MED ORDER — GADOBENATE DIMEGLUMINE 529 MG/ML IV SOLN
20.0000 mL | Freq: Once | INTRAVENOUS | Status: AC | PRN
  Administered 2017-04-20: 18 mL via INTRAVENOUS

## 2017-04-20 NOTE — ED Notes (Signed)
Patient to Res A from 23-patient yelling brother's name "Marcus"-patient staring ahead but will not respond to any verbal commands-family at bedside. Family states patient has taken oxy earlier today before going to her "pain doctor" to ger a pain injection. Dr. Parke Simmers at bedside and getting neuro consult-obtained through neuro skype machine. Patient given Ativan 1 mg for acute agitation and safety restraints placed bilateral wrists for patient safety.

## 2017-04-20 NOTE — H&P (Addendum)
History and Physical    CHANDEL ZAUN LGX:[id number] DOB: 1981-02-17 DOA: 04/19/2017  Referring MD/NP/PA: Dr. Marda Stalker PCP: Alphonzo Grieve Ralene Bathe, MD  Patient coming from: via EMS  Chief Complaint: Altered  I have personally briefly reviewed patient's old medical records in [place]   HPI: Dawn Peters is a 37 y.o. female with medical history significant of MS with left-sided weakness, HTN, anxiety, and depression; presents after being found to be acutely altered.  History is obtained from the patient's fianc and son who are present at bedside as she is unable to give her own at this time.  They report that the patient had been in her normal state of health, but had noted to be dragging her left leg more.  She had recently ran out of her steroids in the last 2-3 days and had called her neurologist office yesterday to get a refill prescription.  Family notes they had taken her to her pain doctor's office yesterday afternoon around 2 PM, and she was given a "pain" injection and after receiving the injection had reportedly fallen in the bathroom.  They deny any trauma to her head, seizure-like activity, tongue biting, or loss of bowel/bladder.  Shortly thereafter she was noted more altered than normal and unable to express her words quite normal.  She had last taken a Percocet sometime earlier in the day.  They ultimately called EMS when her symptoms seem to worsen. En route with EMS patient was given Narcan after being found to have pinpoint pupils.   ED Course: Upon admission into the emergency department patient was noted to be afebrile, pulse 78 to 98, respirations 16-12, blood pressure 153/106-165/111, and O2 saturation 96-100% on 2 L nasal cannula oxygen.  She was noted to have been yelling" Marcus"but not able to follow any commands upon arrival and agitated.  Patient was given 1 mg of Ativan in the ED.  Teleneurology was consulted and recommended obtaining CT angiogram  head and neck did not show any acute intracranial process and chronic demyelinization seen on prior MRIs.  Labs are relatively unremarkable except for creatinine of 1.66 and BUN 19.  Urine drug screen was negative for any acute abnormalities.  Alcohol level was less than 10.  Patient was noted to be lethargic thereafter and difficult to arouse.  Review of Systems  Unable to perform ROS: Mental status change  Musculoskeletal: Positive for falls.  Neurological: Positive for speech change.    Past Medical History:  Diagnosis Date  . Anxiety   . Depression   . Hypertension   . MS (multiple sclerosis) ([place])     Past Surgical History:  Procedure Laterality Date  . BILATERAL HIP ARTHROSCOPY Left 07/2013  . JOINT REPLACEMENT Bilateral     R 2004 and L 2005     reports that  has never smoked. she has never used smokeless tobacco. She reports that she does not drink alcohol or use drugs.  Allergies  Allergen Reactions  . Ace Inhibitors Swelling  . Lisinopril Swelling    Lower lip swelling    Family History  Problem Relation Age of Onset  . Healthy Mother   . Healthy Father   . Breast cancer Unknown        paternal grandmother dx age 57  . Colon cancer Unknown        paternal grandmother  . Hypertension Other   . Diabetes Other     Prior to Admission medications   Medication Sig  Start Date End Date Taking? Authorizing Provider  amitriptyline (ELAVIL) 25 MG tablet TAKE ONE OR TWO AT BEDTIME Patient taking differently: take 25-50mg  by mouth at bedtime 11/09/16  Yes Sater, Nanine Means, MD  amLODipine (NORVASC) 5 MG tablet Take 1 tablet (5 mg total) daily by mouth. 01/10/17  Yes Daleen Bo, MD  amphetamine-dextroamphetamine (ADDERALL XR) 20 MG 24 hr capsule Take 1 capsule (20 mg total) by mouth daily. 04/19/17  Yes Sater, Nanine Means, MD  baclofen (LIORESAL) 20 MG tablet Take 1 tablet (20 mg total) by mouth 3 (three) times daily as needed (for ms). Patient taking differently: Take 20  mg by mouth 4 (four) times daily.  05/04/16  Yes Sater, Nanine Means, MD  citalopram (CELEXA) 40 MG tablet Take 1 tablet (40 mg total) by mouth daily. 05/04/16  Yes Sater, Nanine Means, MD  dalfampridine 10 MG TB12 Take 1 tablet (10 mg total) by mouth 2 (two) times daily. 08/08/16  Yes Sater, Nanine Means, MD  gabapentin (NEURONTIN) 800 MG tablet Take 1 tablet (800 mg total) by mouth 3 (three) times daily. 09/06/16  Yes Sater, Nanine Means, MD  lamoTRIgine (LAMICTAL) 200 MG tablet Take 1 tablet (200 mg total) by mouth 2 (two) times daily. 08/05/16  Yes Sater, Nanine Means, MD  lisinopril (PRINIVIL,ZESTRIL) 20 MG tablet TAKE 1 TABLET BY MOUTH EVERY DAY 10/18/16  Yes Sater, Nanine Means, MD  naproxen (NAPROSYN) 375 MG tablet Take 1 tablet (375 mg total) by mouth 2 (two) times daily. Patient taking differently: Take 375 mg by mouth 2 (two) times daily as needed for mild pain.  07/15/16  Yes Mabe, Shanon Brow, NP  Oxcarbazepine (TRILEPTAL) 300 MG tablet Take 1 tablet (300 mg total) by mouth 2 (two) times daily. 11/11/15  Yes Sater, Nanine Means, MD  oxyCODONE-acetaminophen (PERCOCET/ROXICET) 5-325 MG tablet Take 1 tablet by mouth 4 (four) times daily. 03/27/17  Yes [provider]  SUMAtriptan (IMITREX) 100 MG tablet Take 1 tablet (100 mg total) by mouth once as needed for migraine. May repeat in 2 hours if headache persists or recurs. 12/28/16  Yes Sater, Nanine Means, MD  tamsulosin (FLOMAX) 0.4 MG CAPS capsule Take 0.4 mg by mouth daily.   Yes [provider]  tiZANidine (ZANAFLEX) 4 MG tablet TAKE 1 TABLET BY MOUTH AT BEDTIME 10/18/16  Yes Sater, Nanine Means, MD  TRI-LO-MARZIA 0.18/0.215/0.25 MG-25 MCG tab TAKE 1 TABLET BY MOUTH DAILY. 11/11/16  Yes Woodroe Mode, MD  valACYclovir (VALTREX) 500 MG tablet Take 1 tablet (500 mg total) by mouth 2 (two) times daily. 03/20/17  Yes Sater, Nanine Means, MD  busPIRone (BUSPAR) 15 MG tablet Take 1 tablet (15 mg total) by mouth 2 (two) times daily. 05/04/16   Sater, Nanine Means, MD    HYDROcodone-acetaminophen (NORCO/VICODIN) 5-325 MG tablet Take 1 tablet by mouth 3 (three) times daily as needed for moderate pain. Patient not taking: Reported on 12/29/2016 08/14/16   Theodis Blaze, MD  methylPREDNISolone (MEDROL DOSEPAK) 4 MG TBPK tablet Take as directed. 04/19/17   Sater, Nanine Means, MD  traMADol (ULTRAM-ER) 300 MG 24 hr tablet TAKE 1 TABLET BY MOUTH DAILY Patient not taking: Reported on 04/19/2017 12/21/16   Sater, Nanine Means, MD  TRI-LO-MARZIA 0.18/0.215/0.25 MG-25 MCG tab TAKE 1 TABLET BY MOUTH DAILY. Patient not taking: Reported on 12/29/2016 02/05/16   Woodroe Mode, MD    Physical Exam:  Constitutional: Resting not easily arousable, or readily following commands at this time Vitals:   04/20/17 0010  04/20/17 0158 04/20/17 0200 04/20/17 0236  BP: (!) 159/107 (!) 154/108 (!) 157/108 (!) 159/102  Pulse: 98 86 83 83  Resp: (!) 6 12 12 11   Temp:      TempSrc:      SpO2: 96% 98% 98% 98%   Eyes: PERRL, lids and conjunctivae normal ENMT: Mucous membranes are dry. Posterior pharynx clear of any exudate or lesions.  Neck: normal, supple, no masses, no thyromegaly Respiratory: clear to auscultation bilaterally, no wheezing, no crackles. Normal respiratory effort. No accessory muscle use.  Cardiovascular: Regular rate and rhythm, no murmurs / rubs / gallops. No extremity edema. 2+ pedal pulses. No carotid bruits.  Abdomen: no tenderness, no masses palpated. No hepatosplenomegaly. Bowel sounds positive.  Musculoskeletal: no clubbing / cyanosis. No joint deformity upper and lower extremities. Good ROM, no contractures. Normal muscle tone.  Skin: no rashes, lesions, ulcers. No induration Neurologic: CN 2-12 grossly intact.  Left-sided weakness with intermittent jerks. Psychiatric: Unable to assess at this time    Labs on Admission: I have personally reviewed following labs and imaging studies  CBC: Recent Labs  Lab 04/20/17 0047 04/20/17 0101  WBC 9.0  --    NEUTROABS 8.6*  --   HGB 12.3 12.9  HCT 36.2 38.0  MCV 95.3  --   PLT 256  --    Basic Metabolic Panel: Recent Labs  Lab 04/20/17 0047 04/20/17 0101  NA 143 142  K 4.2 4.2  CL 108 107  CO2 25  --   GLUCOSE 131* 126*  BUN 19 18  CREATININE 1.66* 1.60*  CALCIUM 8.8*  --    GFR: CrCl cannot be calculated (Unknown ideal weight.). Liver Function Tests: Recent Labs  Lab 04/20/17 0047  AST 14*  ALT 12*  ALKPHOS 95  BILITOT 0.3  PROT 7.4  ALBUMIN 3.9   No results for input(s): LIPASE, AMYLASE in the last 168 hours. No results for input(s): AMMONIA in the last 168 hours. Coagulation Profile: Recent Labs  Lab 04/20/17 0047  INR 0.95   Cardiac Enzymes: No results for input(s): CKTOTAL, CKMB, CKMBINDEX, TROPONINI in the last 168 hours. BNP (last 3 results) No results for input(s): PROBNP in the last 8760 hours. HbA1C: No results for input(s): HGBA1C in the last 72 hours. CBG: No results for input(s): GLUCAP in the last 168 hours. Lipid Profile: No results for input(s): CHOL, HDL, LDLCALC, TRIG, CHOLHDL, LDLDIRECT in the last 72 hours. Thyroid Function Tests: No results for input(s): TSH, T4TOTAL, FREET4, T3FREE, THYROIDAB in the last 72 hours. Anemia Panel: No results for input(s): VITAMINB12, FOLATE, FERRITIN, TIBC, IRON, RETICCTPCT in the last 72 hours. Urine analysis:    Component Value Date/Time   COLORURINE STRAW (A) 04/20/2017 0105   APPEARANCEUR CLEAR 04/20/2017 0105   APPEARANCEUR Clear 04/19/2016 1419   LABSPEC 1.006 04/20/2017 0105   PHURINE 6.0 04/20/2017 0105   GLUCOSEU NEGATIVE 04/20/2017 0105   HGBUR SMALL (A) 04/20/2017 0105   BILIRUBINUR NEGATIVE 04/20/2017 0105   BILIRUBINUR Negative 04/19/2016 1419   KETONESUR NEGATIVE 04/20/2017 0105   PROTEINUR NEGATIVE 04/20/2017 0105   UROBILINOGEN 0.2 12/15/2016 1136   NITRITE NEGATIVE 04/20/2017 0105   LEUKOCYTESUR NEGATIVE 04/20/2017 0105   LEUKOCYTESUR Negative 04/19/2016 1419   Sepsis  Labs: No results found for this or any previous visit (from the past 240 hour(s)).   Radiological Exams on Admission: Ct Angio Head W Or Wo Contrast  Result Date: 04/20/2017 CLINICAL DATA:  Found unresponsive after drug and take and injection  from pain doctor. History of multiple sclerosis, hypertension. EXAM: CT ANGIOGRAPHY HEAD AND NECK TECHNIQUE: Multidetector CT imaging of the head and neck was performed using the standard protocol during bolus administration of intravenous contrast. Multiplanar CT image reconstructions and MIPs were obtained to evaluate the vascular anatomy. Carotid stenosis measurements (when applicable) are obtained utilizing NASCET criteria, using the distal internal carotid diameter as the denominator. CONTRAST:  100 cc Isovue 370 COMPARISON:  MRI of the head and cervical spine August 13, 2016 FINDINGS: CT HEAD FINDINGS BRAIN: No intraparenchymal hemorrhage, mass effect nor midline shift. Periventricular white matter hypodensities. The ventricles and sulci are normal. No acute large vascular territory infarcts. No abnormal extra-axial fluid collections. Basal cisterns are patent. VASCULAR: Unremarkable. SKULL/SOFT TISSUES: No skull fracture. No significant soft tissue swelling. ORBITS/SINUSES: The included ocular globes and orbital contents are normal.The mastoid aircells and included paranasal sinuses are well-aerated. OTHER: None. CTA NECK FINDINGS: AORTIC ARCH: Normal appearance of the thoracic arch, normal branch pattern. The origins of the innominate, left Common carotid artery and subclavian artery are widely patent. RIGHT CAROTID SYSTEM: Common carotid artery is widely patent, coursing in a straight line fashion. Normal appearance of the carotid bifurcation without hemodynamically significant stenosis by NASCET criteria. Normal appearance of the internal carotid artery. LEFT CAROTID SYSTEM: Common carotid artery is widely patent, coursing in a straight line fashion. Normal  appearance of the carotid bifurcation without hemodynamically significant stenosis by NASCET criteria. Normal appearance of the internal carotid artery. VERTEBRAL ARTERIES:Codominant vertebral arteries. Normal appearance of the vertebral arteries, widely patent. SKELETON: No acute osseous process though bone windows have not been submitted. OTHER NECK: Soft tissues of the neck are nonacute though, not tailored for evaluation. UPPER CHEST: Included lung apices are clear. No superior mediastinal lymphadenopathy. CTA HEAD FINDINGS: ANTERIOR CIRCULATION: Patent cervical internal carotid arteries, petrous, cavernous and supra clinoid internal carotid arteries. Patent anterior communicating artery. Patent anterior and middle cerebral arteries. No large vessel occlusion, significant stenosis, contrast extravasation or aneurysm. POSTERIOR CIRCULATION: Patent vertebral arteries, vertebrobasilar junction and basilar artery, as well as main branch vessels. Patent posterior cerebral arteries. No large vessel occlusion, significant stenosis, contrast extravasation or aneurysm. VENOUS SINUSES: Major dural venous sinuses are patent though not tailored for evaluation on this angiographic examination. ANATOMIC VARIANTS: None. DELAYED PHASE: No abnormal intracranial enhancement. MIP images reviewed. IMPRESSION: CT HEAD: 1. No acute intracranial process. 2. Periventricular white matter changes better characterize as chronic demyelination on prior MRI. No parenchymal brain volume loss. CTA NECK: 1. Negative CTA NECK. CTA HEAD: 1. Negative CTA HEAD. Electronically Signed   By: Elon Alas M.D.   On: 04/20/2017 01:43   Ct Angio Neck W Or Wo Contrast  Result Date: 04/20/2017 CLINICAL DATA:  Found unresponsive after drug and take and injection from pain doctor. History of multiple sclerosis, hypertension. EXAM: CT ANGIOGRAPHY HEAD AND NECK TECHNIQUE: Multidetector CT imaging of the head and neck was performed using the standard  protocol during bolus administration of intravenous contrast. Multiplanar CT image reconstructions and MIPs were obtained to evaluate the vascular anatomy. Carotid stenosis measurements (when applicable) are obtained utilizing NASCET criteria, using the distal internal carotid diameter as the denominator. CONTRAST:  100 cc Isovue 370 COMPARISON:  MRI of the head and cervical spine August 13, 2016 FINDINGS: CT HEAD FINDINGS BRAIN: No intraparenchymal hemorrhage, mass effect nor midline shift. Periventricular white matter hypodensities. The ventricles and sulci are normal. No acute large vascular territory infarcts. No abnormal extra-axial fluid collections. Basal cisterns are  patent. VASCULAR: Unremarkable. SKULL/SOFT TISSUES: No skull fracture. No significant soft tissue swelling. ORBITS/SINUSES: The included ocular globes and orbital contents are normal.The mastoid aircells and included paranasal sinuses are well-aerated. OTHER: None. CTA NECK FINDINGS: AORTIC ARCH: Normal appearance of the thoracic arch, normal branch pattern. The origins of the innominate, left Common carotid artery and subclavian artery are widely patent. RIGHT CAROTID SYSTEM: Common carotid artery is widely patent, coursing in a straight line fashion. Normal appearance of the carotid bifurcation without hemodynamically significant stenosis by NASCET criteria. Normal appearance of the internal carotid artery. LEFT CAROTID SYSTEM: Common carotid artery is widely patent, coursing in a straight line fashion. Normal appearance of the carotid bifurcation without hemodynamically significant stenosis by NASCET criteria. Normal appearance of the internal carotid artery. VERTEBRAL ARTERIES:Codominant vertebral arteries. Normal appearance of the vertebral arteries, widely patent. SKELETON: No acute osseous process though bone windows have not been submitted. OTHER NECK: Soft tissues of the neck are nonacute though, not tailored for evaluation. UPPER CHEST:  Included lung apices are clear. No superior mediastinal lymphadenopathy. CTA HEAD FINDINGS: ANTERIOR CIRCULATION: Patent cervical internal carotid arteries, petrous, cavernous and supra clinoid internal carotid arteries. Patent anterior communicating artery. Patent anterior and middle cerebral arteries. No large vessel occlusion, significant stenosis, contrast extravasation or aneurysm. POSTERIOR CIRCULATION: Patent vertebral arteries, vertebrobasilar junction and basilar artery, as well as main branch vessels. Patent posterior cerebral arteries. No large vessel occlusion, significant stenosis, contrast extravasation or aneurysm. VENOUS SINUSES: Major dural venous sinuses are patent though not tailored for evaluation on this angiographic examination. ANATOMIC VARIANTS: None. DELAYED PHASE: No abnormal intracranial enhancement. MIP images reviewed. IMPRESSION: CT HEAD: 1. No acute intracranial process. 2. Periventricular white matter changes better characterize as chronic demyelination on prior MRI. No parenchymal brain volume loss. CTA NECK: 1. Negative CTA NECK. CTA HEAD: 1. Negative CTA HEAD. Electronically Signed   By: Elon Alas M.D.   On: 04/20/2017 01:43   Dg Chest Portable 1 View  Result Date: 04/20/2017 CLINICAL DATA:  Altered mental status. History of multiple sclerosis. EXAM: PORTABLE CHEST 1 VIEW COMPARISON:  Chest radiograph November 18, 2015 FINDINGS: Cardiomediastinal silhouette is unremarkable for this low inspiratory examination with crowded vasculature markings. The lungs are clear without pleural effusions or focal consolidations. Trachea projects midline and there is no pneumothorax. Included soft tissue planes and osseous structures are non-suspicious. IMPRESSION: No active disease. Electronically Signed   By: Elon Alas M.D.   On: 04/20/2017 02:39    EKG: Independently reviewed.  Sinus tachycardia at 100 bpm with PVCs  Assessment/Plan Acute encephalopathy with aphasia:  Unclear cause at this time question drug overdose with unknown pain injection given vs MS flare vs stroke vs PRES.  CT angiogram showing no clear signs of acute injury.  Neurology consulted recommending transfer to [facility].  Patient's acute lethargy can be related likely to Ativan given while in the ED for agitation. - Admit to telemetry bed - Stroke order set initiated - Neuro checks - N.p.o - Check  MRI w/ w/o contrast - PT/OT/Speech to eval and treat - Check Hemoglobin A1c and lipid panel in a.m. - Appreciate neurology consultative services.  Follow-up for further recommendations.  May need to re-page neurology in a.m. has not been seen.  Acute kidney injury superimposed on chronic kidney disease stage III: Baseline creatinine previously noted to be around 1.3 but she presents with a creatinine of 1.66 with BUN 19 on admission. - IV fluids of normal saline at 100 mL/h overnight  -  Monitor intake and output - bladder scan  Essential hypertension: Uncontrolled.  Blood pressure seen as high as 154/115. - Hydralazine IV prn    DVT prophylaxis: lovenox Code Status: Full Family Communication: Plan of care with the patient family present at bedside Disposition Plan: TBD Consults called: Neurology Admission status: Inpatient  Norval Morton MD [facility] Pager [phone]   If 7PM-7AM, please contact night-coverage [email or web address] Password [facility]  04/20/2017, 2:49 AM

## 2017-04-20 NOTE — ED Notes (Signed)
Pt became conscious. She is CAOx2 to herself and location. Unsure if she is able to answer questions and refuses or if she will not answer the ones she does not know. Pt is able to move upper extremities freely. Her lower extremities are more stiff and do not have as much freedom of movement. Pt will pick and chose which questions she will answer.

## 2017-04-20 NOTE — Progress Notes (Signed)
OT Cancellation Note  Patient Details Name: Dawn Peters MRN: [id number] DOB: May 03, 1980   Cancelled Treatment:    Reason Eval/Treat Not Completed: Other (comment).  Order received. Pt is in the ED and per MD notes will transfer to [facility].  Diastolic BP has been high.  [name] 04/20/2017, 1:05 PM  Lesle Chris, OTR/L [phone] 04/20/2017

## 2017-04-20 NOTE — Consult Note (Signed)
Referring Physician: Theodis Blaze, MD    Chief Complaint: MS Flare up, left leg weakness   HPI: Dawn Peters is an 37 y.o. female with a past medical history significant for MS with baseline left foot drop, left-sided weakness, hypertension, anxiety, depression who was brought to the ED yesterday for acute altered mental status change as well as worsening left leg weakness.  Neurology was consulted for further evaluation. HPI and review of systems obtained from both patient and her fiance at bedside.  Per patient over the last 2-3 days she has had progressive weakening in her left lower extremity where she noticed she is dragging it more.  Left leg has become so weak that she was unable to climb up stairs and she was sliding down the stairs yesterday.  She called her neurologist office yesterday with her complaints and was prescribed some steroids.  But due to the worsening symptoms patient went to urgent care for evaluation. Per family she was very lethargic and somnolent but easily arousable for a few minutes and then goes back to sleep.  She fell in the bathroom at the urgent care.  Fianc at bedside denies any trauma to the head, seizure-like activity, tongue biting or loss of bowel and bladder.  She was given a shot of steroid at urgent care and discharged home. Per both patient and fianc, patient could barely get into the car from urgent care and was unable to get out of the car due to anxiety, yelling out in pain and with altered mental status.  EMS was called and patient came to the ER.  En route patient was noted to have pinpoint pupils and was given Narcan; however, subsequent tox screen in the ED was negative for opiates.   On admission to the ER patient was very somnolent and was nonverbal as she was in and out of sleep.  She had waxing and waning episodes of agitation,  trying to crawl out of bed, somnolence and some hallucination where she was calling out for her brother who she thought  was at the bedside.  She was evaluated by tele-neurology and recommended CTA head and neck which did not show any intracranial process. MRI showed chronic demyelination unchanged from previous MRI.  Urine drug screen was negative and alcohol level was within normal limits.  Renal function was elevated at 1.66 on admission but has since improved today.     On assessment today patient is awake alert oriented and back to her baseline mentation. Per fianc around 4 AM patient's mentation started improving and by 7:00am she was back to being herself.  Left leg weakness continues to persist. She denies any recent illnesses, eye pain or visual loss, fevers, chills, nausea, vomiting, headache or dizziness.  She does admit to having frequent anxiety attacks and to being under a great deal of stress.   Past Medical History:  Diagnosis Date  . Anxiety   . Depression   . Hypertension   . MS (multiple sclerosis) ([place])     Past Surgical History:  Procedure Laterality Date  . BILATERAL HIP ARTHROSCOPY Left 07/2013  . JOINT REPLACEMENT Bilateral     R 2004 and L 2005    Family History  Problem Relation Age of Onset  . Healthy Mother   . Healthy Father   . Breast cancer Unknown        paternal grandmother dx age 26  . Colon cancer Unknown        paternal  grandmother  . Hypertension Other   . Diabetes Other    Social History:  reports that  has never smoked. she has never used smokeless tobacco. She reports that she does not drink alcohol or use drugs.  Allergies:  Allergies  Allergen Reactions  . Ace Inhibitors Swelling  . Lisinopril Swelling    Lower lip swelling    Medications:  .  stroke: mapping our early stages of recovery book   Does not apply Once  . enoxaparin (LOVENOX) injection  40 mg Subcutaneous Q24H  . LORazepam  0.5 mg Intravenous Once    ROS: 13 point systems reviewed and are negative except above in HPI  Physical Examination: Blood pressure (!) 160/115, pulse 88,  temperature (!) 96.8 F (36 C), temperature source Rectal, resp. rate 16, SpO2 100 %. HEENT-  Normocephalic, no lesions, without obvious abnormality.  Normal external eye and conjunctiva.   Cardiovascular- S1-S2 audible, pulses palpable throughout   Lungs-no rhonchi or wheezing, diminished in bases, saturations within normal limits Abdomen- All 4 quadrants palpated and nontender Musculoskeletal-baseline left foot drop, left extremity weakness Skin-warm and dry   Neurological Examination Mental Status: Alert, oriented, thought content appropriate.  Speech fluent without evidence of aphasia.  Able to follow 3 step commands without difficulty.  Normal concentration, recall and repetition Cranial Nerves: II: Discs flat bilaterally; Visual fields grossly normal,  III,IV, VI: ptosis not present, extra-ocular motions intact bilaterally pupils equal, round, reactive to light and accommodation V,VII: smile symmetric, facial light touch sensation normal bilaterally VIII: Hearing intact to voice IX,X: uvula rises symmetrically XI: bilateral shoulder shrug XII: midline tongue extension Motor: She does have impaired range of motion in left lower Right : Upper extremity   5/5    Left:     Upper extremity   5/5  Lower extremity   5/5     Lower extremity  4/5 Tone and bulk:normal tone throughout; no atrophy noted Sensory: Pinprick and light touch intact throughout, bilaterally Deep Tendon Reflexes: 2+ and symmetric throughout Plantars: Right: downgoing   Left: downgoing Cerebellar: Mild dystaxia with FNF bilaterally.   Gait: Deferred secondary to safety concerns  Ct  Head/ CT Angio head/ Neck W Or Wo Contrast  04/20/2017 IMPRESSION: 1. No acute intracranial process. Negative CTA neck  2. Periventricular white matter changes better characterize as chronic demyelination on prior MRI. No parenchymal brain volume loss.  Mr Jeri Cos And Wo Contrast  04/20/2017 IMPRESSION:  Chronic cerebral MS, stable  MRI appearance from 2017/2018. No areas of new/acute plaque activity or abnormal postcontrast enhancement.   Dg Chest Portable 1 View 04/20/2017  IMPRESSION:  No active disease.    Assessment: 37 y.o. female with a past medical history significant for MS with baseline left foot drop, left-sided weakness, hypertension, anxiety, depression who was brought to the ED yesterday for acute altered mental status change as well as worsening left leg weakness.   1.  MS flare. Patient states her MS flare ups are associated with with left-leg weakness.  Will start 1 g Solu-Medrol IV times 3 days and continue to monitor for improvement.  If no improvement by tomorrow or if worsening of symptoms we will consider MRI C-spine.  Physical therapy for rehab.  2.  Acute metabolic encephalopathy with differential diagnosis of drug overdose versus agitation from panic attacks.  Patient takes Neurontin, baclofen, oxycodone at home and unclear if encephalopathy secondary to several muscle relaxants and pain meds in her system.  Urinalysis was within normal limits,  urine drug screen was negative. At this time patient's mentation is back to baseline. Electrolytes are wnl  3.  Accelerated hypertension  4. Depression/anxiety 5. CKD - appears chronic  Plan: -Continue 1000mg  Solu-Medrol IV x 3 days -Continue physical therapy -If no improvement in symptoms or if symptoms worsen, consider MRI  C-spine. -Continue baseline home medications for pain control and anxiety -Maintain hemodynamic stability -Continue neuro checks -Outpatient Neurology follow up with Dr. Orson Ape DNP Neuro-hospitalist Team [phone] 04/20/2017, 4:16 PM  Electronically signed: Dr. Kerney Elbe

## 2017-04-20 NOTE — ED Notes (Signed)
Pharmacy messaged about daily med release. They may opt to wait for transfer.

## 2017-04-20 NOTE — ED Notes (Signed)
When pt went to the CT scanner the pt had a brief episode of alertness. No comprehensible words were spoken. She did look around and pull her arms towards her. She appeared to have equal strength to both sides.

## 2017-04-20 NOTE — ED Provider Notes (Signed)
[facility] DEPT Provider Note   CSN: [id number] Arrival date & time: 04/19/17  2224     History   Chief Complaint Chief Complaint  Patient presents with  . Drug Overdose    HPI Dawn Peters is a 37 y.o. female.  The history is provided by a relative and medical records. The history is limited by the condition of the patient.  Neurologic Problem  This is a new problem. The current episode started 6 to 12 hours ago. The problem occurs constantly. The problem has not changed since onset.Pertinent negatives include no chest pain, no abdominal pain, no headaches and no shortness of breath. Nothing aggravates the symptoms. Nothing relieves the symptoms. She has tried nothing for the symptoms. The treatment provided no relief.    Past Medical History:  Diagnosis Date  . Anxiety   . Depression   . Hypertension   . MS (multiple sclerosis) [facility])     Patient Active Problem List   Diagnosis Date Noted  . Hydronephrosis of left kidney 10/24/2016  . Attention deficit 10/24/2016  . Left leg weakness 08/13/2016  . Chronic pain 08/13/2016  . Mild renal insufficiency 08/13/2016  . Left-sided weakness   . High risk medication use 09/15/2015  . Hip pain, bilateral 07/28/2015  . Urinary hesitancy 07/28/2015  . Multiple sclerosis exacerbation ([place]) 07/17/2015  . Urinary disorder 06/11/2015  . Gait disturbance 05/19/2015  . Numbness 05/19/2015  . Depression with anxiety 05/13/2015  . Other fatigue 05/13/2015  . Left knee pain 05/13/2015  . Avascular necrosis of bones of both hips ([place]) 05/13/2015  . Screening for HIV (human immunodeficiency virus) 07/08/2014  . Essential hypertension, benign 11/19/2013  . Anxiety state, unspecified 11/19/2013  . Screen for STD (sexually transmitted disease) 11/01/2013  . Encounter for routine gynecological examination 11/01/2013  . Oral contraceptive use 10/20/2011  . Multiple sclerosis ([place]) 08/08/2007  . RASH  AND OTHER NONSPECIFIC SKIN ERUPTION 06/22/2007  . AVASCULAR NECROSIS, FEMORAL HEAD 03/11/2006  . Essential hypertension 02/03/2006  . 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 DVT, HX OF 02/03/2006    Past Surgical History:  Procedure Laterality Date  . BILATERAL HIP ARTHROSCOPY Left 07/2013  . JOINT REPLACEMENT Bilateral     R 2004 and L 2005    OB History    Gravida Para Term Preterm AB Living   1 1 1     1    SAB TAB Ectopic Multiple Live Births           1       Home Medications    Prior to Admission medications   Medication Sig Start Date End Date Taking? Authorizing Provider  amitriptyline (ELAVIL) 25 MG tablet TAKE ONE OR TWO AT BEDTIME Patient taking differently: take 25-50mg  by mouth at bedtime 11/09/16  Yes Sater, Nanine Means, MD  amLODipine (NORVASC) 5 MG tablet Take 1 tablet (5 mg total) daily by mouth. 01/10/17  Yes Daleen Bo, MD  amphetamine-dextroamphetamine (ADDERALL XR) 20 MG 24 hr capsule Take 1 capsule (20 mg total) by mouth daily. 04/19/17  Yes Sater, Nanine Means, MD  baclofen (LIORESAL) 20 MG tablet Take 1 tablet (20 mg total) by mouth 3 (three) times daily as needed (for ms). Patient taking differently: Take 20 mg by mouth 4 (four) times daily.  05/04/16  Yes Sater, Nanine Means, MD  citalopram (CELEXA) 40 MG tablet Take 1 tablet (40 mg total) by mouth daily. 05/04/16  Yes Sater, Nanine Means, MD  dalfampridine 10 MG TB12  Take 1 tablet (10 mg total) by mouth 2 (two) times daily. 08/08/16  Yes Sater, Nanine Means, MD  gabapentin (NEURONTIN) 800 MG tablet Take 1 tablet (800 mg total) by mouth 3 (three) times daily. 09/06/16  Yes Sater, Nanine Means, MD  lamoTRIgine (LAMICTAL) 200 MG tablet Take 1 tablet (200 mg total) by mouth 2 (two) times daily. 08/05/16  Yes Sater, Nanine Means, MD  lisinopril (PRINIVIL,ZESTRIL) 20 MG tablet TAKE 1 TABLET BY MOUTH EVERY DAY 10/18/16  Yes Sater, Nanine Means, MD  naproxen (NAPROSYN) 375 MG tablet Take 1 tablet (375 mg total) by mouth 2 (two) times daily. Patient  taking differently: Take 375 mg by mouth 2 (two) times daily as needed for mild pain.  07/15/16  Yes Mabe, Shanon Brow, NP  Oxcarbazepine (TRILEPTAL) 300 MG tablet Take 1 tablet (300 mg total) by mouth 2 (two) times daily. 11/11/15  Yes Sater, Nanine Means, MD  oxyCODONE-acetaminophen (PERCOCET/ROXICET) 5-325 MG tablet Take 1 tablet by mouth 4 (four) times daily. 03/27/17  Yes [provider]  SUMAtriptan (IMITREX) 100 MG tablet Take 1 tablet (100 mg total) by mouth once as needed for migraine. May repeat in 2 hours if headache persists or recurs. 12/28/16  Yes Sater, Nanine Means, MD  tamsulosin (FLOMAX) 0.4 MG CAPS capsule Take 0.4 mg by mouth daily.   Yes [provider]  tiZANidine (ZANAFLEX) 4 MG tablet TAKE 1 TABLET BY MOUTH AT BEDTIME 10/18/16  Yes Sater, Nanine Means, MD  TRI-LO-MARZIA 0.18/0.215/0.25 MG-25 MCG tab TAKE 1 TABLET BY MOUTH DAILY. 11/11/16  Yes Woodroe Mode, MD  valACYclovir (VALTREX) 500 MG tablet Take 1 tablet (500 mg total) by mouth 2 (two) times daily. 03/20/17  Yes Sater, Nanine Means, MD  busPIRone (BUSPAR) 15 MG tablet Take 1 tablet (15 mg total) by mouth 2 (two) times daily. 05/04/16   Sater, Nanine Means, MD  HYDROcodone-acetaminophen (NORCO/VICODIN) 5-325 MG tablet Take 1 tablet by mouth 3 (three) times daily as needed for moderate pain. Patient not taking: Reported on 12/29/2016 08/14/16   Theodis Blaze, MD  methylPREDNISolone (MEDROL DOSEPAK) 4 MG TBPK tablet Take as directed. 04/19/17   Sater, Nanine Means, MD  traMADol (ULTRAM-ER) 300 MG 24 hr tablet TAKE 1 TABLET BY MOUTH DAILY Patient not taking: Reported on 04/19/2017 12/21/16   Sater, Nanine Means, MD  TRI-LO-MARZIA 0.18/0.215/0.25 MG-25 MCG tab TAKE 1 TABLET BY MOUTH DAILY. Patient not taking: Reported on 12/29/2016 02/05/16   Woodroe Mode, MD    Family History Family History  Problem Relation Age of Onset  . Healthy Mother   . Healthy Father   . Breast cancer Unknown        paternal grandmother dx age 67  . Colon  cancer Unknown        paternal grandmother  . Hypertension Other   . Diabetes Other     Social History Social History   Tobacco Use  . Smoking status: Never Smoker  . Smokeless tobacco: Never Used  Substance Use Topics  . Alcohol use: No  . Drug use: No     Allergies   Ace inhibitors and Lisinopril   Review of Systems Review of Systems  Unable to perform ROS: Patient nonverbal  Respiratory: Negative for shortness of breath.   Cardiovascular: Negative for chest pain.  Gastrointestinal: Negative for abdominal pain.  Neurological: Negative for headaches.     Physical Exam Updated Vital Signs BP (!) 153/106   Pulse 86   Temp (!) 96.8 F (  36 C) (Rectal)   Resp 12   SpO2 98%   Physical Exam  Constitutional: She appears well-developed and well-nourished. She appears distressed.  HENT:  Head: Normocephalic and atraumatic.  Mouth/Throat: Oropharynx is clear and moist. No oropharyngeal exudate.  Eyes: Conjunctivae are normal. Pupils are equal, round, and reactive to light.  Neck: Normal range of motion.  Cardiovascular: Normal rate and intact distal pulses.  No murmur heard. Pulmonary/Chest: Effort normal and breath sounds normal. No respiratory distress. She has no wheezes. She exhibits no tenderness.  Abdominal: Soft. Bowel sounds are normal. She exhibits no distension. There is no tenderness.  Musculoskeletal: She exhibits no edema or tenderness.  Neurological: She is alert. She exhibits abnormal muscle tone (unchanged L leg weakness per fam). GCS eye subscore is 4. GCS verbal subscore is 4. GCS motor subscore is 5.  Patient has some weakness in her left leg compared to the right.  Family reports this is unchanged to prior.  Patient is agitated and is unable to comply with other neurologic exam in regards to sensation and coordination.  Patient was moving both arms with coordination and strength.  Patient appears to have expressive a aphasia and was repeating herself  and perseverating on her brothers name "Beverely Low".  Patient moving eyes in all directions.  No facial droop.  Skin: Capillary refill takes less than 2 seconds. No rash noted. She is not diaphoretic. No erythema.  Nursing note and vitals reviewed.    ED Treatments / Results  Labs (all labs ordered are listed, but only abnormal results are displayed) Labs Reviewed  APTT - Abnormal; Notable for the following components:      Result Value   aPTT 23 (*)    All other components within normal limits  CBC - Abnormal; Notable for the following components:   RBC 3.80 (*)    All other components within normal limits  DIFFERENTIAL - Abnormal; Notable for the following components:   Neutro Abs 8.6 (*)    Lymphs Abs 0.3 (*)    Monocytes Absolute 0.0 (*)    All other components within normal limits  COMPREHENSIVE METABOLIC PANEL - Abnormal; Notable for the following components:   Glucose, Bld 131 (*)    Creatinine, Ser 1.66 (*)    Calcium 8.8 (*)    AST 14 (*)    ALT 12 (*)    GFR calc non Af Amer 38 (*)    GFR calc Af Amer 44 (*)    All other components within normal limits  URINALYSIS, ROUTINE W REFLEX MICROSCOPIC - Abnormal; Notable for the following components:   Color, Urine STRAW (*)    Hgb urine dipstick SMALL (*)    Squamous Epithelial / LPF 0-5 (*)    All other components within normal limits  LIPID PANEL - Abnormal; Notable for the following components:   Cholesterol 228 (*)    LDL Cholesterol 142 (*)    All other components within normal limits  I-STAT CHEM 8, ED - Abnormal; Notable for the following components:   Creatinine, Ser 1.60 (*)    Glucose, Bld 126 (*)    Calcium, Ion 1.14 (*)    All other components within normal limits  ETHANOL  PROTIME-INR  RAPID URINE DRUG SCREEN, HOSP PERFORMED  HEMOGLOBIN A1C  HIV ANTIBODY (ROUTINE TESTING)  BASIC METABOLIC PANEL  I-STAT TROPONIN, ED  I-STAT BETA HCG BLOOD, ED ([facility], [facility], [facility] ONLY)    EKG  EKG  Interpretation  Date/Time:  Thursday April 20 2017 00:17:04 EST Ventricular Rate:  100 PR Interval:    QRS Duration: 88 QT Interval:  353 QTC Calculation: 456 R Axis:   16 Text Interpretation:  Sinus tachycardia When compared to prior, no significant changes seen,.  No STEMI Confirmed by Antony Blackbird [phone]) on 04/20/2017 12:38:58 AM Also confirmed by Antony Blackbird [phone]), editor Hattie Perch ([id number])  on 04/20/2017 7:27:01 AM       Radiology Ct Angio Head W Or Wo Contrast  Result Date: 04/20/2017 CLINICAL DATA:  Found unresponsive after drug and take and injection from pain doctor. History of multiple sclerosis, hypertension. EXAM: CT ANGIOGRAPHY HEAD AND NECK TECHNIQUE: Multidetector CT imaging of the head and neck was performed using the standard protocol during bolus administration of intravenous contrast. Multiplanar CT image reconstructions and MIPs were obtained to evaluate the vascular anatomy. Carotid stenosis measurements (when applicable) are obtained utilizing NASCET criteria, using the distal internal carotid diameter as the denominator. CONTRAST:  100 cc Isovue 370 COMPARISON:  MRI of the head and cervical spine August 13, 2016 FINDINGS: CT HEAD FINDINGS BRAIN: No intraparenchymal hemorrhage, mass effect nor midline shift. Periventricular white matter hypodensities. The ventricles and sulci are normal. No acute large vascular territory infarcts. No abnormal extra-axial fluid collections. Basal cisterns are patent. VASCULAR: Unremarkable. SKULL/SOFT TISSUES: No skull fracture. No significant soft tissue swelling. ORBITS/SINUSES: The included ocular globes and orbital contents are normal.The mastoid aircells and included paranasal sinuses are well-aerated. OTHER: None. CTA NECK FINDINGS: AORTIC ARCH: Normal appearance of the thoracic arch, normal branch pattern. The origins of the innominate, left Common carotid artery and subclavian artery are widely patent. RIGHT CAROTID  SYSTEM: Common carotid artery is widely patent, coursing in a straight line fashion. Normal appearance of the carotid bifurcation without hemodynamically significant stenosis by NASCET criteria. Normal appearance of the internal carotid artery. LEFT CAROTID SYSTEM: Common carotid artery is widely patent, coursing in a straight line fashion. Normal appearance of the carotid bifurcation without hemodynamically significant stenosis by NASCET criteria. Normal appearance of the internal carotid artery. VERTEBRAL ARTERIES:Codominant vertebral arteries. Normal appearance of the vertebral arteries, widely patent. SKELETON: No acute osseous process though bone windows have not been submitted. OTHER NECK: Soft tissues of the neck are nonacute though, not tailored for evaluation. UPPER CHEST: Included lung apices are clear. No superior mediastinal lymphadenopathy. CTA HEAD FINDINGS: ANTERIOR CIRCULATION: Patent cervical internal carotid arteries, petrous, cavernous and supra clinoid internal carotid arteries. Patent anterior communicating artery. Patent anterior and middle cerebral arteries. No large vessel occlusion, significant stenosis, contrast extravasation or aneurysm. POSTERIOR CIRCULATION: Patent vertebral arteries, vertebrobasilar junction and basilar artery, as well as main branch vessels. Patent posterior cerebral arteries. No large vessel occlusion, significant stenosis, contrast extravasation or aneurysm. VENOUS SINUSES: Major dural venous sinuses are patent though not tailored for evaluation on this angiographic examination. ANATOMIC VARIANTS: None. DELAYED PHASE: No abnormal intracranial enhancement. MIP images reviewed. IMPRESSION: CT HEAD: 1. No acute intracranial process. 2. Periventricular white matter changes better characterize as chronic demyelination on prior MRI. No parenchymal brain volume loss. CTA NECK: 1. Negative CTA NECK. CTA HEAD: 1. Negative CTA HEAD. Electronically Signed   By: Elon Alas M.D.   On: 04/20/2017 01:43   Ct Angio Neck W Or Wo Contrast  Result Date: 04/20/2017 CLINICAL DATA:  Found unresponsive after drug and take and injection from pain doctor. History of multiple sclerosis, hypertension. EXAM: CT ANGIOGRAPHY HEAD AND NECK TECHNIQUE: Multidetector CT imaging of the head and neck was performed  using the standard protocol during bolus administration of intravenous contrast. Multiplanar CT image reconstructions and MIPs were obtained to evaluate the vascular anatomy. Carotid stenosis measurements (when applicable) are obtained utilizing NASCET criteria, using the distal internal carotid diameter as the denominator. CONTRAST:  100 cc Isovue 370 COMPARISON:  MRI of the head and cervical spine August 13, 2016 FINDINGS: CT HEAD FINDINGS BRAIN: No intraparenchymal hemorrhage, mass effect nor midline shift. Periventricular white matter hypodensities. The ventricles and sulci are normal. No acute large vascular territory infarcts. No abnormal extra-axial fluid collections. Basal cisterns are patent. VASCULAR: Unremarkable. SKULL/SOFT TISSUES: No skull fracture. No significant soft tissue swelling. ORBITS/SINUSES: The included ocular globes and orbital contents are normal.The mastoid aircells and included paranasal sinuses are well-aerated. OTHER: None. CTA NECK FINDINGS: AORTIC ARCH: Normal appearance of the thoracic arch, normal branch pattern. The origins of the innominate, left Common carotid artery and subclavian artery are widely patent. RIGHT CAROTID SYSTEM: Common carotid artery is widely patent, coursing in a straight line fashion. Normal appearance of the carotid bifurcation without hemodynamically significant stenosis by NASCET criteria. Normal appearance of the internal carotid artery. LEFT CAROTID SYSTEM: Common carotid artery is widely patent, coursing in a straight line fashion. Normal appearance of the carotid bifurcation without hemodynamically significant stenosis by  NASCET criteria. Normal appearance of the internal carotid artery. VERTEBRAL ARTERIES:Codominant vertebral arteries. Normal appearance of the vertebral arteries, widely patent. SKELETON: No acute osseous process though bone windows have not been submitted. OTHER NECK: Soft tissues of the neck are nonacute though, not tailored for evaluation. UPPER CHEST: Included lung apices are clear. No superior mediastinal lymphadenopathy. CTA HEAD FINDINGS: ANTERIOR CIRCULATION: Patent cervical internal carotid arteries, petrous, cavernous and supra clinoid internal carotid arteries. Patent anterior communicating artery. Patent anterior and middle cerebral arteries. No large vessel occlusion, significant stenosis, contrast extravasation or aneurysm. POSTERIOR CIRCULATION: Patent vertebral arteries, vertebrobasilar junction and basilar artery, as well as main branch vessels. Patent posterior cerebral arteries. No large vessel occlusion, significant stenosis, contrast extravasation or aneurysm. VENOUS SINUSES: Major dural venous sinuses are patent though not tailored for evaluation on this angiographic examination. ANATOMIC VARIANTS: None. DELAYED PHASE: No abnormal intracranial enhancement. MIP images reviewed. IMPRESSION: CT HEAD: 1. No acute intracranial process. 2. Periventricular white matter changes better characterize as chronic demyelination on prior MRI. No parenchymal brain volume loss. CTA NECK: 1. Negative CTA NECK. CTA HEAD: 1. Negative CTA HEAD. Electronically Signed   By: Elon Alas M.D.   On: 04/20/2017 01:43   Mr Jeri Cos And Wo Contrast  Result Date: 04/20/2017 CLINICAL DATA:  History of MS, unable to ambulate, confusion with slurred speech. EXAM: MRI HEAD WITHOUT AND WITH CONTRAST TECHNIQUE: Multiplanar, multiecho pulse sequences of the brain and surrounding structures were obtained without and with intravenous contrast. CONTRAST:  105mL MULTIHANCE GADOBENATE DIMEGLUMINE 529 MG/ML IV SOLN COMPARISON:   CT head 04/20/2017.  MR head 08/13/2016 and 06/13/2015. FINDINGS: Brain: No evidence for acute infarction, hemorrhage, mass lesion, hydrocephalus, or extra-axial fluid. Normal for age cerebral volume. T2 and FLAIR hyperintensities are largely confluent, periventricular greater than subcortical, without restriction suggesting chronic multiple sclerosis. Post infusion, no abnormal enhancement of the brain or meninges, with specific attention to the areas of demyelinating disease. No new or acute lesions are observed. Vascular: Normal flow voids. Skull and upper cervical spine: Normal marrow signal. Sinuses/Orbits: Negative. Other: None. IMPRESSION: Chronic cerebral MS, stable MRI appearance from 2017/2018. No areas of new/acute plaque activity or abnormal postcontrast enhancement. Electronically Signed  By: Staci Righter M.D.   On: 04/20/2017 07:37   Dg Chest Portable 1 View  Result Date: 04/20/2017 CLINICAL DATA:  Altered mental status. History of multiple sclerosis. EXAM: PORTABLE CHEST 1 VIEW COMPARISON:  Chest radiograph November 18, 2015 FINDINGS: Cardiomediastinal silhouette is unremarkable for this low inspiratory examination with crowded vasculature markings. The lungs are clear without pleural effusions or focal consolidations. Trachea projects midline and there is no pneumothorax. Included soft tissue planes and osseous structures are non-suspicious. IMPRESSION: No active disease. Electronically Signed   By: Elon Alas M.D.   On: 04/20/2017 02:39    Procedures Procedures (including critical care time)  CRITICAL CARE Performed by: Gwenyth Allegra Jill Ruppe Total critical care time: 45 minutes Critical care time was exclusive of separately billable procedures and treating other patients. Critical care was necessary to treat or prevent imminent or life-threatening deterioration. Critical care was time spent personally by me on the following activities: development of treatment plan with  patient and/or surrogate as well as nursing, discussions with consultants, evaluation of patient's response to treatment, examination of patient, obtaining history from patient or surrogate, ordering and performing treatments and interventions, ordering and review of laboratory studies, ordering and review of radiographic studies, pulse oximetry and re-evaluation of patient's condition.   Medications Ordered in ED Medications  LORazepam (ATIVAN) 2 MG/ML injection (1 mg  Given 04/20/17 0005)  iopamidol (ISOVUE-370) 76 % injection 100 mL (100 mLs Intravenous Contrast Given 04/20/17 0117)     Initial Impression / Assessment and Plan / ED Course  I have reviewed the triage vital signs and the nursing notes.  Pertinent labs & imaging results that were available during my care of the patient were reviewed by me and considered in my medical decision making (see chart for details).     Kingslee CELIA FRIEDLAND is a 37 y.o. female with a past medical history significant for multiple sclerosis, hypertension, and anxiety who presents with altered mental status and speech of normality.  According to family, patient was last normal at 2 PM today.  At 3 PM, patient had difficulty with ambulation.  Patient then went to her PCP who did a steroid injection to try and help.  This was at 7 PM.  Family reports the patient took 2 Percocet before going to the doctor.  Patient was then resting and somnolent after the injection going home.  They report that when patient was difficult to arouse, they called EMS.  According to nursing documentation from EMS, patient was given Narcan 2 mg which increased respirations but patient was still somnolent.  Patient was found to have pinpoint pupils at that time.  On my initial evaluation, patient was aroused and had significant speech abnormality.  Patient appeared to have expressive a aphasia and could not get out what she wanted to express.  Patient was perseverating and repeating the  name Beverely Low which family reports is an uncle.  Patient is very agitated but was moving all extremities try to get out of bed.  Patient's left leg was weak on inspection and family reports this is normal for her.  Patient was moving her eyes in all directions and had symmetric pupils on my evaluation.  Lungs were clear and chest was nontender.  Abdomen nontender.    Due to the concern for an acute neurologic problem with her speech, the tele-neurology team was quickly called.  It does not appear that patient is Lucianne Lei positive as per family reports her left leg weakness is  at baseline and she only has a new speech abnormality.  Patient was recommended to have CTA of the head and neck by the tele-neurology team.  Patient was given 1 mg of IV Ativan to help with her severe agitation showed that images could be safely collected.  Aside from stroke, MS flare or anoxic injury from the possible accidental opioid overdose considered as well.  Family reports that patient has not had other drugs or alcohol use.  2:22 AM CT head and CTA of the head and neck did not show evidence of vascular abnormality and did not show evidence of acute intracranial abnormality.  Patient does have evidence of old demyelination problems from her MS.  Neurology was called at [facility] and they agree that since patient is not back to baseline, she will need admission to [facility] for neurologic evaluation as well as workup for MS flare versus stroke.  They recommended MRI with and without contrast.  This was ordered.  They also recommended further infectious workup with chest x-ray and urinalysis and UDS.  These were ordered.    Hospitalist team was called for admission for lateral transfer to [facility] and neurology management.   Final Clinical Impressions(s) / ED Diagnoses   Final diagnoses:  Aphasia  Agitation  AKI (acute kidney injury) ([place])     Clinical Impression: 1. Aphasia   2. Agitation   3. AKI (acute  kidney injury) ([place])     Disposition: Admit  This note was prepared with assistance of Dragon voice recognition software. Occasional wrong-word or sound-a-like substitutions may have occurred due to the inherent limitations of voice recognition software.      Viviana Trimble, Gwenyth Allegra, MD 04/20/17 [phone]

## 2017-04-20 NOTE — ED Notes (Signed)
Attempt report to 4E Rn unable to take report at this time.

## 2017-04-20 NOTE — Progress Notes (Signed)
SLP Cancellation Note  Patient Details Name: Dawn Peters MRN: [id number] DOB: 08/04/1980   Cancelled treatment:       Reason Eval/Treat Not Completed: Other (comment). Pt currently in ED with workup underway. Pt with chronic MS. MD notes indicate pt will be transferring to [facility]. SLP will follow up there when pt is available and appropriate. Thank you for this referral!  Ariea Rochin B. Quentin Ore [facility], CCC-SLP Speech Language Pathologist [phone]  Dawn Peters 04/20/2017, 1:19 PM

## 2017-04-20 NOTE — Progress Notes (Addendum)
PT Cancellation Note  Patient Details Name: RAINELLE SULEWSKI MRN: [id number] DOB: [birth year]   Cancelled Treatment:    Reason Eval/Treat Not Completed: Other (comment) per MD note, patient to transfer to [facility]. Will defer PT  to next venue. Also noted to have high BP.    Claretha Cooper 04/20/2017, 1:00 PM Tresa Endo PT [phone]

## 2017-04-20 NOTE — ED Notes (Addendum)
Called 3W at [facility] for report and they refused stating that the nurse was in a contact room and to give report next shift.

## 2017-04-20 NOTE — Progress Notes (Signed)
Pt seen and examined at bedside, admitted after midnight, please see earlier admission note by Dr. Tamala Julian. Pt admitted for evaluation of sudden onset altered mental status. Current work up including CT and MRI brain with no evidence of acute stroke. Pt has known MS and ? Of flare up even though no signs of active disease on MRI. Also UDS and UA unremarkable, CXR with no evidence of acute cardiopulmonary illness. Pt reports she is still not at her baseline, her LLE is hurting and she says she thinks this is from her MS. She has a neurologist at [facility]. She wants to go to [facility] for further evaluation. Her family is at the bedside and also agreed with her going to [facility]. Will call neurology team once pt gets to [facility]. She is stable for transfer.   Faye Ramsay, MD  [facility] Pager [phone]  If 7PM-7AM, please contact night-coverage [email or web address] Password [email or web address]

## 2017-04-20 NOTE — Progress Notes (Signed)
Report received by ongoing nurse. Agreed with nurse assessment of patient and will cont to monitor.  

## 2017-04-20 NOTE — ED Notes (Signed)
ED TO INPATIENT HANDOFF REPORT  Name/Age/Gender Dawn Peters 37 y.o. female  Code Status    Code Status Orders  (From admission, onward)        Start     Ordered   04/20/17 0503  Full code  Continuous     04/20/17 0504    Code Status History    Date Active Date Inactive Code Status Order ID Comments User Context   08/13/2016 03:51 08/14/2016 18:08 Full Code [id number]  Vianne Bulls, MD ED   12/22/2015 01:05 12/23/2015 21:57 Full Code [id number]  [name], DO Inpatient   07/17/2015 09:56 07/19/2015 13:40 Full Code [id number]  Kelvin Cellar, MD Inpatient      Home/SNF/Other Home  Chief Complaint Drug Overdose  Level of Care/Admitting Diagnosis ED Disposition    ED Disposition Condition [facility] Area: [facility] [[id number]]  Level of Care: Telemetry [5]  Admit to tele based on following criteria: Other see comments  Comments: neurological event  Diagnosis: Acute encephalopathy [696789]  Admitting Physician: Norval Morton [[id number]]  Attending Physician: Norval Morton [[id number]]  Estimated length of stay: past midnight tomorrow  Certification:: I certify this patient will need inpatient services for at least 2 midnights  PT Class (Do Not Modify): Inpatient [101]  PT Acc Code (Do Not Modify): Private [1]       Medical History Past Medical History:  Diagnosis Date  . Anxiety   . Depression   . Hypertension   . MS (multiple sclerosis) (HCC)     Allergies Allergies  Allergen Reactions  . Ace Inhibitors Swelling  . Lisinopril Swelling    Lower lip swelling    IV Location/Drains/Wounds Patient Lines/Drains/Airways Status   Active Line/Drains/Airways    Name:   Placement date:   Placement time:   Site:   Days:   Peripheral IV 04/19/17 Anterior;Left Wrist   04/19/17    2248    Wrist   1   Peripheral IV 04/20/17 Right Antecubital   04/20/17    0052    Antecubital   less than 1           Labs/Imaging Results for orders placed or performed during the hospital encounter of 04/19/17 (from the past 48 hour(s))  Ethanol     Status: None   Collection Time: 04/20/17 12:47 AM  Result Value Ref Range   Alcohol, Ethyl (B) <10 <10 mg/dL    Comment:        LOWEST DETECTABLE LIMIT FOR SERUM ALCOHOL IS 10 mg/dL FOR MEDICAL PURPOSES ONLY Performed at [facility] [address] [place], [place] [address]   Protime-INR     Status: None   Collection Time: 04/20/17 12:47 AM  Result Value Ref Range   Prothrombin Time 12.6 11.4 - 15.2 seconds   INR 0.95     Comment: Performed at [facility], [place] [address], [place], [place] [address]  APTT     Status: Abnormal   Collection Time: 04/20/17 12:47 AM  Result Value Ref Range   aPTT 23 (L) 24 - 36 seconds    Comment: Performed at [facility], [place] [address] [place], [place] [address]  CBC     Status: Abnormal   Collection Time: 04/20/17 12:47 AM  Result Value Ref Range   WBC 9.0 4.0 - 10.5 K/uL   RBC 3.80 (L) 3.87 - 5.11 MIL/uL   Hemoglobin 12.3 12.0 - 15.0 g/dL  HCT 36.2 36.0 - 46.0 %   MCV 95.3 78.0 - 100.0 fL   MCH 32.4 26.0 - 34.0 pg   MCHC 34.0 30.0 - 36.0 g/dL   RDW 15.3 11.5 - 15.5 %   Platelets 256 150 - 400 K/uL    Comment: Performed at [facility], [place] [address], [place], [place] [address]  Differential     Status: Abnormal   Collection Time: 04/20/17 12:47 AM  Result Value Ref Range   Neutrophils Relative % 96 %   Neutro Abs 8.6 (H) 1.7 - 7.7 K/uL   Lymphocytes Relative 4 %   Lymphs Abs 0.3 (L) 0.7 - 4.0 K/uL   Monocytes Relative 0 %   Monocytes Absolute 0.0 (L) 0.1 - 1.0 K/uL   Eosinophils Relative 0 %   Eosinophils Absolute 0.0 0.0 - 0.7 K/uL   Basophils Relative 0 %   Basophils Absolute 0.0 0.0 - 0.1 K/uL    Comment: Performed at [facility], [place] [address], [place], [place] [address]  Comprehensive  metabolic panel     Status: Abnormal   Collection Time: 04/20/17 12:47 AM  Result Value Ref Range   Sodium 143 135 - 145 mmol/L   Potassium 4.2 3.5 - 5.1 mmol/L   Chloride 108 101 - 111 mmol/L   CO2 25 22 - 32 mmol/L   Glucose, Bld 131 (H) 65 - 99 mg/dL   BUN 19 6 - 20 mg/dL   Creatinine, Ser 1.66 (H) 0.44 - 1.00 mg/dL   Calcium 8.8 (L) 8.9 - 10.3 mg/dL   Total Protein 7.4 6.5 - 8.1 g/dL   Albumin 3.9 3.5 - 5.0 g/dL   AST 14 (L) 15 - 41 U/L   ALT 12 (L) 14 - 54 U/L   Alkaline Phosphatase 95 38 - 126 U/L   Total Bilirubin 0.3 0.3 - 1.2 mg/dL   GFR calc non Af Amer 38 (L) >60 mL/min   GFR calc Af Amer 44 (L) >60 mL/min    Comment: (NOTE) The eGFR has been calculated using the CKD EPI equation. This calculation has not been validated in all clinical situations. eGFR's persistently <60 mL/min signify possible Chronic Kidney Disease.    Anion gap 10 5 - 15    Comment: Performed at [facility], [place] [address] [place], [place] [address]  Hemoglobin A1c     Status: None   Collection Time: 04/20/17 12:47 AM  Result Value Ref Range   Hgb A1c MFr Bld 5.3 4.8 - 5.6 %    Comment: (NOTE) Pre diabetes:          5.7%-6.4% Diabetes:              >6.4% Glycemic control for   <7.0% adults with diabetes    Mean Plasma Glucose 105.41 mg/dL    Comment: Performed at [facility] [address], [place], [place] [address]  Lipid panel     Status: Abnormal   Collection Time: 04/20/17 12:47 AM  Result Value Ref Range   Cholesterol 228 (H) 0 - 200 mg/dL   Triglycerides 46 <150 mg/dL   HDL 77 >40 mg/dL   Total CHOL/HDL Ratio 3.0 RATIO   VLDL 9 0 - 40 mg/dL   LDL Cholesterol 142 (H) 0 - 99 mg/dL    Comment:        Total Cholesterol/HDL:CHD Risk Coronary Heart Disease Risk Table  Men   Women  1/2 Average Risk   3.4   3.3  Average Risk       5.0   4.4  2 X Average Risk   9.6   7.1  3 X Average Risk  23.4   11.0        Use the calculated Patient  Ratio above and the CHD Risk Table to determine the patient's CHD Risk.        ATP III CLASSIFICATION (LDL):  <100     mg/dL   Optimal  100-129  mg/dL   Near or Above                    Optimal  130-159  mg/dL   Borderline  160-189  mg/dL   High  >190     mg/dL   Very High Performed at [facility] [address], [place], [place] [address]   I-stat troponin, ED     Status: None   Collection Time: 04/20/17  1:00 AM  Result Value Ref Range   Troponin i, poc 0.00 0.00 - 0.08 ng/mL   Comment 3            Comment: Due to the release kinetics of cTnI, a negative result within the first hours of the onset of symptoms does not rule out myocardial infarction with certainty. If myocardial infarction is still suspected, repeat the test at appropriate intervals.   I-Stat Chem 8, ED     Status: Abnormal   Collection Time: 04/20/17  1:01 AM  Result Value Ref Range   Sodium 142 135 - 145 mmol/L   Potassium 4.2 3.5 - 5.1 mmol/L   Chloride 107 101 - 111 mmol/L   BUN 18 6 - 20 mg/dL   Creatinine, Ser 1.60 (H) 0.44 - 1.00 mg/dL   Glucose, Bld 126 (H) 65 - 99 mg/dL   Calcium, Ion 1.14 (L) 1.15 - 1.40 mmol/L   TCO2 27 22 - 32 mmol/L   Hemoglobin 12.9 12.0 - 15.0 g/dL   HCT 38.0 36.0 - 46.0 %  I-Stat beta hCG blood, ED     Status: None   Collection Time: 04/20/17  1:01 AM  Result Value Ref Range   I-stat hCG, quantitative <5.0 <5 mIU/mL   Comment 3            Comment:   GEST. AGE      CONC.  (mIU/mL)   <=1 WEEK        5 - 50     2 WEEKS       50 - 500     3 WEEKS       100 - 10,000     4 WEEKS     1,000 - 30,000        FEMALE AND NON-PREGNANT FEMALE:     LESS THAN 5 mIU/mL   Urine rapid drug screen (hosp performed)     Status: None   Collection Time: 04/20/17  1:05 AM  Result Value Ref Range   Opiates NONE DETECTED NONE DETECTED   Cocaine NONE DETECTED NONE DETECTED   Benzodiazepines NONE DETECTED NONE DETECTED   Amphetamines NONE DETECTED NONE DETECTED    Tetrahydrocannabinol NONE DETECTED NONE DETECTED   Barbiturates NONE DETECTED NONE DETECTED    Comment: (NOTE) DRUG SCREEN FOR MEDICAL PURPOSES ONLY.  IF CONFIRMATION IS NEEDED FOR ANY PURPOSE, NOTIFY LAB WITHIN 5 DAYS. LOWEST DETECTABLE LIMITS FOR URINE DRUG SCREEN Drug  Class                     Cutoff (ng/mL) Amphetamine and metabolites    1000 Barbiturate and metabolites    200 Benzodiazepine                 956 Tricyclics and metabolites     300 Opiates and metabolites        300 Cocaine and metabolites        300 THC                            50 Performed at [facility], [place] [address] [place], [place] [address]   Urinalysis, Routine w reflex microscopic     Status: Abnormal   Collection Time: 04/20/17  1:05 AM  Result Value Ref Range   Color, Urine STRAW (A) YELLOW   APPearance CLEAR CLEAR   Specific Gravity, Urine 1.006 1.005 - 1.030   pH 6.0 5.0 - 8.0   Glucose, UA NEGATIVE NEGATIVE mg/dL   Hgb urine dipstick SMALL (A) NEGATIVE   Bilirubin Urine NEGATIVE NEGATIVE   Ketones, ur NEGATIVE NEGATIVE mg/dL   Protein, ur NEGATIVE NEGATIVE mg/dL   Nitrite NEGATIVE NEGATIVE   Leukocytes, UA NEGATIVE NEGATIVE   RBC / HPF 0-5 0 - 5 RBC/hpf   WBC, UA 0-5 0 - 5 WBC/hpf   Bacteria, UA NONE SEEN NONE SEEN   Squamous Epithelial / LPF 0-5 (A) NONE SEEN   Mucus PRESENT     Comment: Performed at [facility], [place] [address], [place], [place] [address]  HIV antibody (Routine Testing)     Status: None   Collection Time: 04/20/17  7:52 AM  Result Value Ref Range   HIV Screen 4th Generation wRfx Non Reactive Non Reactive    Comment: (NOTE) Performed At: [facility] [place], [place] [id number] Rush Farmer MD [id number] Performed at [facility], [place] [address], [place], [place] [address]   Basic metabolic panel     Status: Abnormal   Collection Time: 04/20/17  7:52 AM  Result Value Ref Range    Sodium 142 135 - 145 mmol/L   Potassium 4.7 3.5 - 5.1 mmol/L   Chloride 108 101 - 111 mmol/L   CO2 25 22 - 32 mmol/L   Glucose, Bld 116 (H) 65 - 99 mg/dL   BUN 17 6 - 20 mg/dL   Creatinine, Ser 1.45 (H) 0.44 - 1.00 mg/dL   Calcium 9.1 8.9 - 10.3 mg/dL   GFR calc non Af Amer 45 (L) >60 mL/min   GFR calc Af Amer 52 (L) >60 mL/min    Comment: (NOTE) The eGFR has been calculated using the CKD EPI equation. This calculation has not been validated in all clinical situations. eGFR's persistently <60 mL/min signify possible Chronic Kidney Disease.    Anion gap 9 5 - 15    Comment: Performed at [facility], [place] [address] [place],  [address]   Ct Angio Head W Or Wo Contrast  Result Date: 04/20/2017 CLINICAL DATA:  Found unresponsive after drug and take and injection from pain doctor. History of multiple sclerosis, hypertension. EXAM: CT ANGIOGRAPHY HEAD AND NECK TECHNIQUE: Multidetector CT imaging of the head and neck was performed using the standard protocol during bolus administration of intravenous contrast. Multiplanar CT image reconstructions and MIPs were obtained to evaluate the vascular anatomy.  Carotid stenosis measurements (when applicable) are obtained utilizing NASCET criteria, using the distal internal carotid diameter as the denominator. CONTRAST:  100 cc Isovue 370 COMPARISON:  MRI of the head and cervical spine August 13, 2016 FINDINGS: CT HEAD FINDINGS BRAIN: No intraparenchymal hemorrhage, mass effect nor midline shift. Periventricular white matter hypodensities. The ventricles and sulci are normal. No acute large vascular territory infarcts. No abnormal extra-axial fluid collections. Basal cisterns are patent. VASCULAR: Unremarkable. SKULL/SOFT TISSUES: No skull fracture. No significant soft tissue swelling. ORBITS/SINUSES: The included ocular globes and orbital contents are normal.The mastoid aircells and included paranasal sinuses are well-aerated. OTHER:  None. CTA NECK FINDINGS: AORTIC ARCH: Normal appearance of the thoracic arch, normal branch pattern. The origins of the innominate, left Common carotid artery and subclavian artery are widely patent. RIGHT CAROTID SYSTEM: Common carotid artery is widely patent, coursing in a straight line fashion. Normal appearance of the carotid bifurcation without hemodynamically significant stenosis by NASCET criteria. Normal appearance of the internal carotid artery. LEFT CAROTID SYSTEM: Common carotid artery is widely patent, coursing in a straight line fashion. Normal appearance of the carotid bifurcation without hemodynamically significant stenosis by NASCET criteria. Normal appearance of the internal carotid artery. VERTEBRAL ARTERIES:Codominant vertebral arteries. Normal appearance of the vertebral arteries, widely patent. SKELETON: No acute osseous process though bone windows have not been submitted. OTHER NECK: Soft tissues of the neck are nonacute though, not tailored for evaluation. UPPER CHEST: Included lung apices are clear. No superior mediastinal lymphadenopathy. CTA HEAD FINDINGS: ANTERIOR CIRCULATION: Patent cervical internal carotid arteries, petrous, cavernous and supra clinoid internal carotid arteries. Patent anterior communicating artery. Patent anterior and middle cerebral arteries. No large vessel occlusion, significant stenosis, contrast extravasation or aneurysm. POSTERIOR CIRCULATION: Patent vertebral arteries, vertebrobasilar junction and basilar artery, as well as main branch vessels. Patent posterior cerebral arteries. No large vessel occlusion, significant stenosis, contrast extravasation or aneurysm. VENOUS SINUSES: Major dural venous sinuses are patent though not tailored for evaluation on this angiographic examination. ANATOMIC VARIANTS: None. DELAYED PHASE: No abnormal intracranial enhancement. MIP images reviewed. IMPRESSION: CT HEAD: 1. No acute intracranial process. 2. Periventricular white  matter changes better characterize as chronic demyelination on prior MRI. No parenchymal brain volume loss. CTA NECK: 1. Negative CTA NECK. CTA HEAD: 1. Negative CTA HEAD. Electronically Signed   By: [place]on Alas M.D.   On: 04/20/2017 01:43   Ct Angio Neck W Or Wo Contrast  Result Date: 04/20/2017 CLINICAL DATA:  Found unresponsive after drug and take and injection from pain doctor. History of multiple sclerosis, hypertension. EXAM: CT ANGIOGRAPHY HEAD AND NECK TECHNIQUE: Multidetector CT imaging of the head and neck was performed using the standard protocol during bolus administration of intravenous contrast. Multiplanar CT image reconstructions and MIPs were obtained to evaluate the vascular anatomy. Carotid stenosis measurements (when applicable) are obtained utilizing NASCET criteria, using the distal internal carotid diameter as the denominator. CONTRAST:  100 cc Isovue 370 COMPARISON:  MRI of the head and cervical spine August 13, 2016 FINDINGS: CT HEAD FINDINGS BRAIN: No intraparenchymal hemorrhage, mass effect nor midline shift. Periventricular white matter hypodensities. The ventricles and sulci are normal. No acute large vascular territory infarcts. No abnormal extra-axial fluid collections. Basal cisterns are patent. VASCULAR: Unremarkable. SKULL/SOFT TISSUES: No skull fracture. No significant soft tissue swelling. ORBITS/SINUSES: The included ocular globes and orbital contents are normal.The mastoid aircells and included paranasal sinuses are well-aerated. OTHER: None. CTA NECK FINDINGS: AORTIC ARCH: Normal appearance of the thoracic arch, normal branch pattern. The  origins of the innominate, left Common carotid artery and subclavian artery are widely patent. RIGHT CAROTID SYSTEM: Common carotid artery is widely patent, coursing in a straight line fashion. Normal appearance of the carotid bifurcation without hemodynamically significant stenosis by NASCET criteria. Normal appearance of the internal  carotid artery. LEFT CAROTID SYSTEM: Common carotid artery is widely patent, coursing in a straight line fashion. Normal appearance of the carotid bifurcation without hemodynamically significant stenosis by NASCET criteria. Normal appearance of the internal carotid artery. VERTEBRAL ARTERIES:Codominant vertebral arteries. Normal appearance of the vertebral arteries, widely patent. SKELETON: No acute osseous process though bone windows have not been submitted. OTHER NECK: Soft tissues of the neck are nonacute though, not tailored for evaluation. UPPER CHEST: Included lung apices are clear. No superior mediastinal lymphadenopathy. CTA HEAD FINDINGS: ANTERIOR CIRCULATION: Patent cervical internal carotid arteries, petrous, cavernous and supra clinoid internal carotid arteries. Patent anterior communicating artery. Patent anterior and middle cerebral arteries. No large vessel occlusion, significant stenosis, contrast extravasation or aneurysm. POSTERIOR CIRCULATION: Patent vertebral arteries, vertebrobasilar junction and basilar artery, as well as main branch vessels. Patent posterior cerebral arteries. No large vessel occlusion, significant stenosis, contrast extravasation or aneurysm. VENOUS SINUSES: Major dural venous sinuses are patent though not tailored for evaluation on this angiographic examination. ANATOMIC VARIANTS: None. DELAYED PHASE: No abnormal intracranial enhancement. MIP images reviewed. IMPRESSION: CT HEAD: 1. No acute intracranial process. 2. Periventricular white matter changes better characterize as chronic demyelination on prior MRI. No parenchymal brain volume loss. CTA NECK: 1. Negative CTA NECK. CTA HEAD: 1. Negative CTA HEAD. Electronically Signed   By: [place] Alas M.D.   On: 04/20/2017 01:43   Mr Jeri Cos And Wo Contrast  Result Date: 04/20/2017 CLINICAL DATA:  History of MS, unable to ambulate, confusion with slurred speech. EXAM: MRI HEAD WITHOUT AND WITH CONTRAST TECHNIQUE:  Multiplanar, multiecho pulse sequences of the brain and surrounding structures were obtained without and with intravenous contrast. CONTRAST:  11m MULTIHANCE GADOBENATE DIMEGLUMINE 529 MG/ML IV SOLN COMPARISON:  CT head 04/20/2017.  MR head 08/13/2016 and 06/13/2015. FINDINGS: Brain: No evidence for acute infarction, hemorrhage, mass lesion, hydrocephalus, or extra-axial fluid. Normal for age cerebral volume. T2 and FLAIR hyperintensities are largely confluent, periventricular greater than subcortical, without restriction suggesting chronic multiple sclerosis. Post infusion, no abnormal enhancement of the brain or meninges, with specific attention to the areas of demyelinating disease. No new or acute lesions are observed. Vascular: Normal flow voids. Skull and upper cervical spine: Normal marrow signal. Sinuses/Orbits: Negative. Other: None. IMPRESSION: Chronic cerebral MS, stable MRI appearance from 2017/2018. No areas of new/acute plaque activity or abnormal postcontrast enhancement. Electronically Signed   By: JStaci RighterM.D.   On: 04/20/2017 07:37   Dg Chest Portable 1 View  Result Date: 04/20/2017 CLINICAL DATA:  Altered mental status. History of multiple sclerosis. EXAM: PORTABLE CHEST 1 VIEW COMPARISON:  Chest radiograph November 18, 2015 FINDINGS: Cardiomediastinal silhouette is unremarkable for this low inspiratory examination with crowded vasculature markings. The lungs are clear without pleural effusions or focal consolidations. Trachea projects midline and there is no pneumothorax. Included soft tissue planes and osseous structures are non-suspicious. IMPRESSION: No active disease. Electronically Signed   By: CElon AlasM.D.   On: 04/20/2017 02:39    Pending Labs Unresulted Labs (From admission, onward)   Start     Ordered   04/21/17 0500  CBC  Tomorrow morning,   R     04/20/17 1045   04/21/17 0500  Basic  metabolic panel  Tomorrow morning,   R     04/20/17 1045       Vitals/Pain Today's Vitals   04/20/17 1700 04/20/17 1730 04/20/17 1842 04/20/17 1934  BP: (!) 147/114 (!) 160/110 (!) 150/122 (!) 158/112  Pulse: 91 92 88 96  Resp: 17 (!) '21 16 18  '$ Temp:      TempSrc:      SpO2: 100% 98% 100% 100%    Isolation Precautions No active isolations  Medications Medications   stroke: mapping our early stages of recovery book (not administered)  0.9 %  sodium chloride infusion ( Intravenous Rate/Dose Change 04/20/17 1518)  acetaminophen (TYLENOL) tablet 650 mg (not administered)    Or  acetaminophen (TYLENOL) solution 650 mg (not administered)    Or  acetaminophen (TYLENOL) suppository 650 mg (not administered)  senna-docusate (Senokot-S) tablet 1 tablet (not administered)  enoxaparin (LOVENOX) injection 40 mg (40 mg Subcutaneous Given 04/20/17 1518)  LORazepam (ATIVAN) injection 0.5 mg (0.5 mg Intravenous Not Given 04/20/17 1407)  hydrALAZINE (APRESOLINE) injection 10 mg (not administered)  methylPREDNISolone sodium succinate (SOLU-MEDROL) 1,000 mg in sodium chloride 0.9 % 50 mL IVPB (0 mg Intravenous Stopped 04/20/17 1626)  amitriptyline (ELAVIL) tablet 25 mg (not administered)  amLODipine (NORVASC) tablet 5 mg (not administered)  amphetamine-dextroamphetamine (ADDERALL XR) 24 hr capsule 20 mg (not administered)  baclofen (LIORESAL) tablet 20 mg (not administered)  busPIRone (BUSPAR) tablet 15 mg (not administered)  citalopram (CELEXA) tablet 40 mg (not administered)  dalfampridine TB12 10 mg (not administered)  gabapentin (NEURONTIN) tablet 800 mg (not administered)  naproxen (NAPROSYN) tablet 375 mg (not administered)  Oxcarbazepine (TRILEPTAL) tablet 300 mg (not administered)  oxyCODONE-acetaminophen (PERCOCET/ROXICET) 5-325 MG per tablet 1 tablet (not administered)  tamsulosin (FLOMAX) capsule 0.4 mg (not administered)  tiZANidine (ZANAFLEX) tablet 4 mg (not administered)  valACYclovir (VALTREX) tablet 500 mg (not administered)  LORazepam  (ATIVAN) 2 MG/ML injection (1 mg  Given 04/20/17 0005)  iopamidol (ISOVUE-370) 76 % injection 100 mL (100 mLs Intravenous Contrast Given 04/20/17 0117)  calcium gluconate 1 g in sodium chloride 0.9 % 100 mL IVPB (0 g Intravenous Stopped 04/20/17 1407)  LORazepam (ATIVAN) injection 0.5 mg (0.5 mg Intravenous Given 04/20/17 0613)  gadobenate dimeglumine (MULTIHANCE) injection 20 mL (18 mLs Intravenous Contrast Given 04/20/17 0707)    Mobility walks with device normally, now with the weakness on the left side, unsure.

## 2017-04-20 NOTE — ED Notes (Signed)
Pt is stating that her legs hurt.

## 2017-04-20 NOTE — ED Notes (Addendum)
Attempted to have the stroke tele assess pt. They were unable to assess pt due to pt being unresponsive. No assessment was able to be completed.

## 2017-04-21 LAB — MRSA PCR SCREENING: MRSA BY PCR: NEGATIVE

## 2017-04-21 LAB — BASIC METABOLIC PANEL
ANION GAP: 11 (ref 5–15)
BUN: 18 mg/dL (ref 6–20)
CALCIUM: 9 mg/dL (ref 8.9–10.3)
CO2: 23 mmol/L (ref 22–32)
Chloride: 102 mmol/L (ref 101–111)
Creatinine, Ser: 1.3 mg/dL — ABNORMAL HIGH (ref 0.44–1.00)
GFR, EST AFRICAN AMERICAN: 60 mL/min — AB (ref >60)
GFR, EST NON AFRICAN AMERICAN: 51 mL/min — AB (ref >60)
Glucose, Bld: 140 mg/dL — ABNORMAL HIGH (ref 65–99)
Potassium: 3.9 mmol/L (ref 3.5–5.1)
SODIUM: 136 mmol/L (ref 135–145)

## 2017-04-21 LAB — CBC
HCT: 36.4 % (ref 36.0–46.0)
Hemoglobin: 12.5 g/dL (ref 12.0–15.0)
MCH: 32.6 pg (ref 26.0–34.0)
MCHC: 34.3 g/dL (ref 30.0–36.0)
MCV: 95 fL (ref 78.0–100.0)
PLATELETS: 235 10*3/uL (ref 150–400)
RBC: 3.83 MIL/uL — ABNORMAL LOW (ref 3.87–5.11)
RDW: 15.7 % — AB (ref 11.5–15.5)
WBC: 9.3 10*3/uL (ref 4.0–10.5)

## 2017-04-21 MED ORDER — METOPROLOL TARTRATE 5 MG/5ML IV SOLN
5.0000 mg | Freq: Once | INTRAVENOUS | Status: AC
  Administered 2017-04-21: 5 mg via INTRAVENOUS

## 2017-04-21 MED ORDER — METOPROLOL TARTRATE 5 MG/5ML IV SOLN
INTRAVENOUS | Status: AC
  Administered 2017-04-21: 20:00:00
  Filled 2017-04-21: qty 5

## 2017-04-21 NOTE — Evaluation (Signed)
Speech Language Pathology Evaluation Patient Details Name: Dawn Peters MRN: [id number] DOB: [birth year] Today's Date: 04/21/2017 Time: 9983-3825 SLP Time Calculation (min) (ACUTE ONLY): 25 min  Problem List:  Patient Active Problem List   Diagnosis Date Noted  . Acute encephalopathy 04/20/2017  . Acute kidney injury superimposed on CKD ([place]) 04/20/2017  . Hydronephrosis of left kidney 10/24/2016  . Attention deficit 10/24/2016  . Left leg weakness 08/13/2016  . Chronic pain 08/13/2016  . Mild renal insufficiency 08/13/2016  . Left-sided weakness   . High risk medication use 09/15/2015  . Hip pain, bilateral 07/28/2015  . Urinary hesitancy 07/28/2015  . Multiple sclerosis exacerbation ([place]) 07/17/2015  . Urinary disorder 06/11/2015  . Gait disturbance 05/19/2015  . Numbness 05/19/2015  . Depression with anxiety 05/13/2015  . Other fatigue 05/13/2015  . Left knee pain 05/13/2015  . Avascular necrosis of bones of both hips ([place]) 05/13/2015  . Screening for HIV (human immunodeficiency virus) 07/08/2014  . Essential hypertension, benign 11/19/2013  . Anxiety state, unspecified 11/19/2013  . Screen for STD (sexually transmitted disease) 11/01/2013  . Encounter for routine gynecological examination 11/01/2013  . Oral contraceptive use 10/20/2011  . Multiple sclerosis ([place]) 08/08/2007  . RASH AND OTHER NONSPECIFIC SKIN ERUPTION 06/22/2007  . AVASCULAR NECROSIS, FEMORAL HEAD 03/11/2006  . Essential hypertension 02/03/2006  . 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 DVT, HX OF 02/03/2006   Past Medical History:  Past Medical History:  Diagnosis Date  . Anxiety   . Depression   . Hypertension   . MS (multiple sclerosis) ([place])    Past Surgical History:  Past Surgical History:  Procedure Laterality Date  . BILATERAL HIP ARTHROSCOPY Left 07/2013  . JOINT REPLACEMENT Bilateral     R 2004 and L 2005   HPI:  Dawn Peters a 37 y.o.femalewith medical history significant  forMS with left-sided weakness,HTN, anxiety, and depression;presents after being found to be acutely altered. History is obtained from the patient's fianc and son who are present at bedside as she is unable to give her own at this time. They report that the patient had been in her normal state of health, but had noted to be dragging her left leg more. She had recently run out of her steroids in the last 2-3 days and hadcalled herneurologist office yesterday to get a refill prescription. Family notes they had taken her to her paindoctor's officeyesterdayafternoon around 2 PM,and she was given a"pain"injection and after receiving the injection had reportedly fallen in the bathroom. They deny any trauma to her head,seizure-like activity, tongue biting,orloss of bowel/bladder. Shortly thereaftershe was notedmore altered than normal and unable to express herwordsquite normal. She had last takena Percocet sometime earlier in the day. They ultimately called EMS when her symptoms seem to worsen. En route with EMS patient was given Narcan after being found to have pinpoint pupils. MRI showing Chronic cerebral MS, stable MRI appearance from 2017/2018 with no areas of new/acute plaque activity or abnormal postcontrast enhancement.  Chest xray is showing no active disease.     Assessment / Plan / Recommendation Clinical Impression  Cognitive/linguistic and motor speech screen were completed.  The patient's speech was clear and easy to understand.  No discernible dysarthria was noted.  The patient achieved a score of 28/30 on the Mini Mental State Exam suggesting functional cognitive skills.  She was oriented x4, had good immediate recall of 3 novel words, and good attention to task.   In addition, she was able to name objects, follow  a 3 step command, repeat a short sentence, read/comprehend a short sentence and write a sentence.  Mild issues noted for delayed recall.  The patient was able to  recall 2/3 novel words given a short delay.  She also struggled to copy a design, however, she complains of issues using a pen to write/draw.  She was able to draw a decent clock face but struggled to set the time.  She provided logical solutions to simple problems.  Given results of this assessment ST follow up is not indicated.  She was educated to the possible cognitive and swallowing issues that can possibly occur with MS and was encouraged to follow up with her MD if she has concerns.  ST to sign off.      SLP Assessment  SLP Recommendation/Assessment: Patient does not need any further Speech Lanaguage Pathology Services SLP Visit Diagnosis: Attention and concentration deficit Attention and concentration deficit following: Other cerebrovascular disease    Follow Up Recommendations  None          SLP Evaluation Cognition  Overall Cognitive Status: Within Functional Limits for tasks assessed Arousal/Alertness: Awake/alert Orientation Level: Oriented X4 Attention: Focused Focused Attention: Appears intact Memory: Appears intact Awareness: Appears intact Problem Solving: Appears intact       Comprehension  Auditory Comprehension Overall Auditory Comprehension: Appears within functional limits for tasks assessed Commands: Within Functional Limits Conversation: Complex Reading Comprehension Reading Status: Within funtional limits    Expression Expression Primary Mode of Expression: Verbal Verbal Expression Overall Verbal Expression: Appears within functional limits for tasks assessed Initiation: No impairment Automatic Speech: Name;Social Response Level of Generative/Spontaneous Verbalization: Conversation Repetition: No impairment Naming: No impairment Pragmatics: No impairment Non-Verbal Means of Communication: Not applicable Written Expression Dominant Hand: Right Written Expression: Within Functional Limits   Oral / Motor  Motor Speech Overall Motor Speech: Appears  within functional limits for tasks assessed Phonation: Normal Resonance: Within functional limits Articulation: Within functional limitis Intelligibility: Intelligible Motor Planning: Witnin functional limits Motor Speech Errors: Not applicable   GO                   Shelly Flatten, MA, CCC-SLP Acute Rehab SLP [phone] Lamar Sprinkles 04/21/2017, 9:49 AM

## 2017-04-21 NOTE — Progress Notes (Addendum)
PT Cancellation Note  Patient Details Name: Dawn Peters MRN: [id number] DOB: [birth year]   Cancelled Treatment:    Reason Eval/Treat Not Completed: Other (comment) 11:59 AM Pt on phone and PT introduced self.  Pt reports being on external catheter (purewick) and states RN was supposed to look into mobilizing with keeping this in place??  Pt remained on phone; PT discussed with RN.  Will check back at a later time.  14:45 pm Checked back with nursing and RN states pt prefers to just rest today and attempt mobility tomorrow.  Will check back as schedule permits.   Dawn Peters,KATHrine E 04/21/2017, 11:59 AM Carmelia Bake, PT, DPT 04/21/2017 Pager: [phone]

## 2017-04-21 NOTE — Progress Notes (Signed)
Discussed PT. Patient reports she has not been feeling well today. Pt request to work with PT tomorrow.

## 2017-04-21 NOTE — Progress Notes (Signed)
OT Cancellation Note  Patient Details Name: HAEVEN NICKLE MRN: [id number] DOB: 1980/04/21   Cancelled Treatment:    Reason Eval/Treat Not Completed: Fatigue/lethargy limiting ability to participate (per conversation with PT, who spoke to RN)  [place] 04/21/2017, 2:47 PM  Lesle Chris, OTR/L [phone] 04/21/2017

## 2017-04-21 NOTE — Progress Notes (Signed)
Patient ID: Dawn Peters, female   DOB: November 05, 1980, 38 y.o.   MRN: [id number]    PROGRESS NOTE  Dawn Peters  PXT:[id number] DOB: [birth year] DOA: 04/19/2017  PCP: Javier Docker, MD   Brief Narrative:  Patient is 37 year old female with known multiple sclerosis, at baseline left foot drop and left sided weakness, hypertension and anxiety, presented to emergency department with acute altered mental status as well as worsening left leg weakness. Patient reports noticing that her left foot was getting weaker and she was dragging and more. She suddenly became more lethargic and somnolent. Patient explains she has never had similar event in the past and is not sure what brought this on. She has not started any new medications and has been compliant with recommended regimen.  While in emergency department, patient was somnolent and minimally verbal, waxing and waning episodes of agitation, hallucinations. CT head and MRI head showed chronic demyelination unchanged from previous scans. Urine drug screen and alcohol level were negative.  Assessment & Plan:  Acute metabolic encephalopathy - Appears to be secondary to MS flare in the setting of possible drug overdose - Patient is on multiple medications at home, some meds more sedating, including baclofen, Neurontin, oxycodone, amitriptyline, Zanaflex - mental status is clear this morning - Continue with current supportive care  - PT evaluation   Left lower extremity weakness - Worse from her baseline, secondary to MS flare - Appreciate neurology team following  - Continue Solu-Medrol thousand milligrams IV for total 3 days, today is day #2 - If no improvement, will consider MRI of C-spine - Per neurology, continue home medications. Pain control and exciting - Neurology follow-up with Dr. Felecia Shelling  Acute kidney injury  - Prerenal in etiology, overall improving    DVT prophylaxis:  Lovenox SQ  Code Status:  full code  Family  Communication: Patient at bedside  Disposition Plan:  in 2 days, once completes IV Solu-Medrol treatment   Consultants:  Neurology   Procedures:   None   Antimicrobials:   None   Subjective: No events overnight. Patient reports feeling better.  Objective: Vitals:   04/20/17 1934 04/20/17 2140 04/21/17 0455 04/21/17 0814  BP: (!) 158/112 (!) 161/96 (!) 152/102 (!) 151/96  Pulse: 96 92 78   Resp: 18 20 17    Temp:  98.5 F (36.9 C) 97.9 F (36.6 C)   TempSrc:  Oral Oral   SpO2: 100%  100%   Weight:  91.2 kg (201 lb 1 oz)    Height:  5\' 5"  (1.651 m)      Intake/Output Summary (Last 24 hours) at 04/21/2017 1121 Last data filed at 04/21/2017 0645 Gross per 24 hour  Intake 1665 ml  Output -  Net 1665 ml   Filed Weights   04/20/17 2140  Weight: 91.2 kg (201 lb 1 oz)    Examination:  General exam: Appears calm and comfortable  Respiratory system: Clear to auscultation. Respiratory effort normal. Cardiovascular system: S1 & S2 heard, RRR. No JVD, murmurs, rubs, gallops or clicks. No pedal edema. Gastrointestinal system: Abdomen is nondistended, soft and nontender. No organomegaly or masses felt. Normal bowel sounds heard. Central nervous system: Alert and oriented. left lower extremity weakness  Skin: No rashes, lesions or ulcers Psychiatry: Judgement and insight appear normal. Mood & affect appropriate.   Data Reviewed: I have personally reviewed following labs and imaging studies  CBC: Recent Labs  Lab 04/20/17 0047 04/20/17 0101 04/21/17 0501  WBC 9.0  --  9.3  NEUTROABS 8.6*  --   --   HGB 12.3 12.9 12.5  HCT 36.2 38.0 36.4  MCV 95.3  --  95.0  PLT 256  --  161   Basic Metabolic Panel: Recent Labs  Lab 04/20/17 0047 04/20/17 0101 04/20/17 0752 04/21/17 0501  NA 143 142 142 136  K 4.2 4.2 4.7 3.9  CL 108 107 108 102  CO2 25  --  25 23  GLUCOSE 131* 126* 116* 140*  BUN 19 18 17 18   CREATININE 1.66* 1.60* 1.45* 1.30*  CALCIUM 8.8*  --  9.1 9.0     Liver Function Tests: Recent Labs  Lab 04/20/17 0047  AST 14*  ALT 12*  ALKPHOS 95  BILITOT 0.3  PROT 7.4  ALBUMIN 3.9   Coagulation Profile: Recent Labs  Lab 04/20/17 0047  INR 0.95   HbA1C: Recent Labs    04/20/17 0047  HGBA1C 5.3   Lipid Profile: Recent Labs    04/20/17 0047  CHOL 228*  HDL 77  LDLCALC 142*  TRIG 46  CHOLHDL 3.0   Urine analysis:    Component Value Date/Time   COLORURINE STRAW (A) 04/20/2017 0105   APPEARANCEUR CLEAR 04/20/2017 0105   APPEARANCEUR Clear 04/19/2016 1419   LABSPEC 1.006 04/20/2017 0105   PHURINE 6.0 04/20/2017 0105   GLUCOSEU NEGATIVE 04/20/2017 0105   HGBUR SMALL (A) 04/20/2017 0105   BILIRUBINUR NEGATIVE 04/20/2017 0105   BILIRUBINUR Negative 04/19/2016 1419   KETONESUR NEGATIVE 04/20/2017 0105   PROTEINUR NEGATIVE 04/20/2017 0105   UROBILINOGEN 0.2 12/15/2016 1136   NITRITE NEGATIVE 04/20/2017 0105   LEUKOCYTESUR NEGATIVE 04/20/2017 0105   LEUKOCYTESUR Negative 04/19/2016 1419   Recent Results (from the past 240 hour(s))  MRSA PCR Screening     Status: None   Collection Time: 04/20/17  9:38 PM  Result Value Ref Range Status   MRSA by PCR NEGATIVE NEGATIVE Final    Comment:        The GeneXpert MRSA Assay (FDA approved for NASAL specimens only), is one component of a comprehensive MRSA colonization surveillance program. It is not intended to diagnose MRSA infection nor to guide or monitor treatment for MRSA infections. Performed at [facility], [place] [address], [place], [place] [address]     Radiology Studies: Ct Angio Head W Or Wo Contrast  Result Date: 04/20/2017 CLINICAL DATA:  Found unresponsive after drug and take and injection from pain doctor. History of multiple sclerosis, hypertension. EXAM: CT ANGIOGRAPHY HEAD AND NECK TECHNIQUE: Multidetector CT imaging of the head and neck was performed using the standard protocol during bolus administration of intravenous contrast.  Multiplanar CT image reconstructions and MIPs were obtained to evaluate the vascular anatomy. Carotid stenosis measurements (when applicable) are obtained utilizing NASCET criteria, using the distal internal carotid diameter as the denominator. CONTRAST:  100 cc Isovue 370 COMPARISON:  MRI of the head and cervical spine August 13, 2016 FINDINGS: CT HEAD FINDINGS BRAIN: No intraparenchymal hemorrhage, mass effect nor midline shift. Periventricular white matter hypodensities. The ventricles and sulci are normal. No acute large vascular territory infarcts. No abnormal extra-axial fluid collections. Basal cisterns are patent. VASCULAR: Unremarkable. SKULL/SOFT TISSUES: No skull fracture. No significant soft tissue swelling. ORBITS/SINUSES: The included ocular globes and orbital contents are normal.The mastoid aircells and included paranasal sinuses are well-aerated. OTHER: None. CTA NECK FINDINGS: AORTIC ARCH: Normal appearance of the thoracic arch, normal branch pattern. The origins of the innominate, left Common carotid artery and subclavian artery  are widely patent. RIGHT CAROTID SYSTEM: Common carotid artery is widely patent, coursing in a straight line fashion. Normal appearance of the carotid bifurcation without hemodynamically significant stenosis by NASCET criteria. Normal appearance of the internal carotid artery. LEFT CAROTID SYSTEM: Common carotid artery is widely patent, coursing in a straight line fashion. Normal appearance of the carotid bifurcation without hemodynamically significant stenosis by NASCET criteria. Normal appearance of the internal carotid artery. VERTEBRAL ARTERIES:Codominant vertebral arteries. Normal appearance of the vertebral arteries, widely patent. SKELETON: No acute osseous process though bone windows have not been submitted. OTHER NECK: Soft tissues of the neck are nonacute though, not tailored for evaluation. UPPER CHEST: Included lung apices are clear. No superior mediastinal  lymphadenopathy. CTA HEAD FINDINGS: ANTERIOR CIRCULATION: Patent cervical internal carotid arteries, petrous, cavernous and supra clinoid internal carotid arteries. Patent anterior communicating artery. Patent anterior and middle cerebral arteries. No large vessel occlusion, significant stenosis, contrast extravasation or aneurysm. POSTERIOR CIRCULATION: Patent vertebral arteries, vertebrobasilar junction and basilar artery, as well as main branch vessels. Patent posterior cerebral arteries. No large vessel occlusion, significant stenosis, contrast extravasation or aneurysm. VENOUS SINUSES: Major dural venous sinuses are patent though not tailored for evaluation on this angiographic examination. ANATOMIC VARIANTS: None. DELAYED PHASE: No abnormal intracranial enhancement. MIP images reviewed. IMPRESSION: CT HEAD: 1. No acute intracranial process. 2. Periventricular white matter changes better characterize as chronic demyelination on prior MRI. No parenchymal brain volume loss. CTA NECK: 1. Negative CTA NECK. CTA HEAD: 1. Negative CTA HEAD. Electronically Signed   By: Elon Alas M.D.   On: 04/20/2017 01:43   Ct Angio Neck W Or Wo Contrast  Result Date: 04/20/2017 CLINICAL DATA:  Found unresponsive after drug and take and injection from pain doctor. History of multiple sclerosis, hypertension. EXAM: CT ANGIOGRAPHY HEAD AND NECK TECHNIQUE: Multidetector CT imaging of the head and neck was performed using the standard protocol during bolus administration of intravenous contrast. Multiplanar CT image reconstructions and MIPs were obtained to evaluate the vascular anatomy. Carotid stenosis measurements (when applicable) are obtained utilizing NASCET criteria, using the distal internal carotid diameter as the denominator. CONTRAST:  100 cc Isovue 370 COMPARISON:  MRI of the head and cervical spine August 13, 2016 FINDINGS: CT HEAD FINDINGS BRAIN: No intraparenchymal hemorrhage, mass effect nor midline shift.  Periventricular white matter hypodensities. The ventricles and sulci are normal. No acute large vascular territory infarcts. No abnormal extra-axial fluid collections. Basal cisterns are patent. VASCULAR: Unremarkable. SKULL/SOFT TISSUES: No skull fracture. No significant soft tissue swelling. ORBITS/SINUSES: The included ocular globes and orbital contents are normal.The mastoid aircells and included paranasal sinuses are well-aerated. OTHER: None. CTA NECK FINDINGS: AORTIC ARCH: Normal appearance of the thoracic arch, normal branch pattern. The origins of the innominate, left Common carotid artery and subclavian artery are widely patent. RIGHT CAROTID SYSTEM: Common carotid artery is widely patent, coursing in a straight line fashion. Normal appearance of the carotid bifurcation without hemodynamically significant stenosis by NASCET criteria. Normal appearance of the internal carotid artery. LEFT CAROTID SYSTEM: Common carotid artery is widely patent, coursing in a straight line fashion. Normal appearance of the carotid bifurcation without hemodynamically significant stenosis by NASCET criteria. Normal appearance of the internal carotid artery. VERTEBRAL ARTERIES:Codominant vertebral arteries. Normal appearance of the vertebral arteries, widely patent. SKELETON: No acute osseous process though bone windows have not been submitted. OTHER NECK: Soft tissues of the neck are nonacute though, not tailored for evaluation. UPPER CHEST: Included lung apices are clear. No superior mediastinal lymphadenopathy.  CTA HEAD FINDINGS: ANTERIOR CIRCULATION: Patent cervical internal carotid arteries, petrous, cavernous and supra clinoid internal carotid arteries. Patent anterior communicating artery. Patent anterior and middle cerebral arteries. No large vessel occlusion, significant stenosis, contrast extravasation or aneurysm. POSTERIOR CIRCULATION: Patent vertebral arteries, vertebrobasilar junction and basilar artery, as well as  main branch vessels. Patent posterior cerebral arteries. No large vessel occlusion, significant stenosis, contrast extravasation or aneurysm. VENOUS SINUSES: Major dural venous sinuses are patent though not tailored for evaluation on this angiographic examination. ANATOMIC VARIANTS: None. DELAYED PHASE: No abnormal intracranial enhancement. MIP images reviewed. IMPRESSION: CT HEAD: 1. No acute intracranial process. 2. Periventricular white matter changes better characterize as chronic demyelination on prior MRI. No parenchymal brain volume loss. CTA NECK: 1. Negative CTA NECK. CTA HEAD: 1. Negative CTA HEAD. Electronically Signed   By: Elon Alas M.D.   On: 04/20/2017 01:43   Mr Jeri Cos And Wo Contrast  Result Date: 04/20/2017 CLINICAL DATA:  History of MS, unable to ambulate, confusion with slurred speech. EXAM: MRI HEAD WITHOUT AND WITH CONTRAST TECHNIQUE: Multiplanar, multiecho pulse sequences of the brain and surrounding structures were obtained without and with intravenous contrast. CONTRAST:  37mL MULTIHANCE GADOBENATE DIMEGLUMINE 529 MG/ML IV SOLN COMPARISON:  CT head 04/20/2017.  MR head 08/13/2016 and 06/13/2015. FINDINGS: Brain: No evidence for acute infarction, hemorrhage, mass lesion, hydrocephalus, or extra-axial fluid. Normal for age cerebral volume. T2 and FLAIR hyperintensities are largely confluent, periventricular greater than subcortical, without restriction suggesting chronic multiple sclerosis. Post infusion, no abnormal enhancement of the brain or meninges, with specific attention to the areas of demyelinating disease. No new or acute lesions are observed. Vascular: Normal flow voids. Skull and upper cervical spine: Normal marrow signal. Sinuses/Orbits: Negative. Other: None. IMPRESSION: Chronic cerebral MS, stable MRI appearance from 2017/2018. No areas of new/acute plaque activity or abnormal postcontrast enhancement. Electronically Signed   By: Staci Righter M.D.   On: 04/20/2017  07:37   Dg Chest Portable 1 View  Result Date: 04/20/2017 CLINICAL DATA:  Altered mental status. History of multiple sclerosis. EXAM: PORTABLE CHEST 1 VIEW COMPARISON:  Chest radiograph November 18, 2015 FINDINGS: Cardiomediastinal silhouette is unremarkable for this low inspiratory examination with crowded vasculature markings. The lungs are clear without pleural effusions or focal consolidations. Trachea projects midline and there is no pneumothorax. Included soft tissue planes and osseous structures are non-suspicious. IMPRESSION: No active disease. Electronically Signed   By: Elon Alas M.D.   On: 04/20/2017 02:39    Scheduled Meds: .  stroke: mapping our early stages of recovery book   Does not apply Once  . amitriptyline  25 mg Oral QHS  . amLODipine  5 mg Oral Daily  . amphetamine-dextroamphetamine  20 mg Oral Daily  . busPIRone  15 mg Oral BID  . chlorhexidine  15 mL Mouth Rinse BID  . citalopram  40 mg Oral Daily  . dalfampridine  10 mg Oral BID  . enoxaparin (LOVENOX) injection  40 mg Subcutaneous Q24H  . gabapentin  800 mg Oral TID  . LORazepam  0.5 mg Intravenous Once  . mouth rinse  15 mL Mouth Rinse q12n4p  . Oxcarbazepine  300 mg Oral BID  . oxyCODONE-acetaminophen  1 tablet Oral QID  . tamsulosin  0.4 mg Oral Daily  . tiZANidine  4 mg Oral QHS  . valACYclovir  500 mg Oral BID   Continuous Infusions: . sodium chloride 75 mL/hr at 04/21/17 1056  . methylPREDNISolone (SOLU-MEDROL) injection 1,000 mg (04/21/17 1057)  LOS: 1 day   Time spent: 35 minutes  35 minutes with > 50% of time discussing current diagnostic test results, clinical impression and plan of care.  Faye Ramsay, MD [facility] Pager [phone]  If 7PM-7AM, please contact night-coverage [email or web address] Password [facility] 04/21/2017, 11:21 AM

## 2017-04-22 LAB — BASIC METABOLIC PANEL
Anion gap: 10 (ref 5–15)
BUN: 24 mg/dL — ABNORMAL HIGH (ref 6–20)
CHLORIDE: 106 mmol/L (ref 101–111)
CO2: 25 mmol/L (ref 22–32)
Calcium: 9 mg/dL (ref 8.9–10.3)
Creatinine, Ser: 1.17 mg/dL — ABNORMAL HIGH (ref 0.44–1.00)
GFR, EST NON AFRICAN AMERICAN: 58 mL/min — AB (ref >60)
Glucose, Bld: 145 mg/dL — ABNORMAL HIGH (ref 65–99)
POTASSIUM: 3.7 mmol/L (ref 3.5–5.1)
SODIUM: 141 mmol/L (ref 135–145)

## 2017-04-22 LAB — CBC
HCT: 35.3 % — ABNORMAL LOW (ref 36.0–46.0)
HEMOGLOBIN: 11.9 g/dL — AB (ref 12.0–15.0)
MCH: 32.1 pg (ref 26.0–34.0)
MCHC: 33.7 g/dL (ref 30.0–36.0)
MCV: 95.1 fL (ref 78.0–100.0)
PLATELETS: 228 10*3/uL (ref 150–400)
RBC: 3.71 MIL/uL — AB (ref 3.87–5.11)
RDW: 15.8 % — ABNORMAL HIGH (ref 11.5–15.5)
WBC: 11.1 10*3/uL — AB (ref 4.0–10.5)

## 2017-04-22 MED ORDER — METOPROLOL TARTRATE 5 MG/5ML IV SOLN: 5.0000 mg | Freq: Once | INTRAVENOUS | Status: DC

## 2017-04-22 NOTE — Evaluation (Signed)
Physical Therapy Evaluation Patient Details Name: Dawn Peters MRN: [id number] DOB: 08/20/1980 Today's Date: 04/22/2017   History of Present Illness  This 37 y.o. female admitted with AMS and worsening Lt LE weakness.  Dx: MS flare with  Acute metabolic encephalopathy, Lt LE weakness; AKI, leukocytosis  Clinical Impression  Pt admitted with above diagnosis. Pt currently with functional limitations due to the deficits listed below (see PT Problem List). Pt will benefit from skilled PT to increase their independence and safety with mobility to allow discharge to the venue listed below.  Pt requiring RW for safe gait at this time.  Recommend HHPT to work towards returning to independent level of function as well as home safety assessment due to two flights of stairs at home.     Follow Up Recommendations Home health PT;Supervision - Intermittent    Equipment Recommendations  None recommended by PT    Recommendations for Other Services       Precautions / Restrictions Precautions Precautions: Fall      Mobility  Bed Mobility Overal bed mobility: Modified Independent Bed Mobility: Supine to Sit     Supine to sit: Supervision        Transfers Overall transfer level: Needs assistance Equipment used: Rolling walker (2 wheeled) Transfers: Sit to/from Stand Sit to Stand: Supervision Stand pivot transfers: Min guard       General transfer comment: min guard to steady   Ambulation/Gait Ambulation/Gait assistance: Min guard Ambulation Distance (Feet): 500 Feet Assistive device: Rolling walker (2 wheeled) Gait Pattern/deviations: Ataxic;Decreased dorsiflexion - left     General Gait Details: Pt with ataxic gait at times and shaky.  Reliant on RW  Stairs Stairs: Yes Stairs assistance: Min guard Stair Management: One rail Left;Step to pattern;Sideways Number of Stairs: 10 General stair comments: Pt negiotiated stairs with 2 hands and sideways step to pattern.  She  states family is always with her on the stairs.  Wheelchair Mobility    Modified Rankin (Stroke Patients Only)       Balance Overall balance assessment: Needs assistance Sitting-balance support: Feet supported Sitting balance-Leahy Scale: Good     Standing balance support: During functional activity Standing balance-Leahy Scale: Poor Standing balance comment: requires min guard assist due to occasional light min A due to decreased balance.                              Pertinent Vitals/Pain Pain Assessment: No/denies pain    Home Living Family/patient expects to be discharged to:: Private residence Living Arrangements: Spouse/significant other;Children Available Help at Discharge: Family Type of Home: Apartment Home Access: Stairs to enter Entrance Stairs-Rails: Chemical engineer of Steps: full flight  Home Layout: Two level;Full bath on main level Home Equipment: Walker - 4 wheels Additional Comments: LIves with finace' and son     Prior Function Level of Independence: Independent         Comments: Pt denies falls (although in June, she reports falls with exacerbations).   Pt does not work, drives, grocery shops.  Does not use AD for ambulation      Hand Dominance   Dominant Hand: Right    Extremity/Trunk Assessment   Upper Extremity Assessment Upper Extremity Assessment: Defer to OT evaluation RUE Deficits / Details: tremulous  RUE Coordination: decreased fine motor LUE Deficits / Details: tremulous  LUE Coordination: decreased fine motor    Lower Extremity Assessment Lower Extremity Assessment: RLE deficits/detail;LLE deficits/detail  RLE Deficits / Details: ataxic LLE Deficits / Details: L foot drop, ataxic    Cervical / Trunk Assessment Cervical / Trunk Assessment: Normal  Communication   Communication: No difficulties  Cognition Arousal/Alertness: Awake/alert Behavior During Therapy: WFL for tasks  assessed/performed Overall Cognitive Status: Within Functional Limits for tasks assessed                                        General Comments General comments (skin integrity, edema, etc.): Pt reports improving sypmtoms, but is not back to baseline     Exercises     Assessment/Plan    PT Assessment Patient needs continued PT services  PT Problem List Decreased strength;Decreased activity tolerance;Decreased balance;Decreased coordination;Decreased mobility       PT Treatment Interventions DME instruction;Gait training;Stair training;Therapeutic exercise;Therapeutic activities;Functional mobility training;Balance training;Patient/family education    PT Goals (Current goals can be found in the Care Plan section)  Acute Rehab PT Goals Patient Stated Goal: to get back to normal  PT Goal Formulation: With patient Time For Goal Achievement: 05/06/17 Potential to Achieve Goals: Good    Frequency Min 3X/week   Barriers to discharge        Co-evaluation               AM-PAC PT "6 Clicks" Daily Activity  Outcome Measure Difficulty turning over in bed (including adjusting bedclothes, sheets and blankets)?: None Difficulty moving from lying on back to sitting on the side of the bed? : None Difficulty sitting down on and standing up from a chair with arms (e.g., wheelchair, bedside commode, etc,.)?: A Little Help needed moving to and from a bed to chair (including a wheelchair)?: A Little Help needed walking in hospital room?: A Little Help needed climbing 3-5 steps with a railing? : A Little 6 Click Score: 20    End of Session Equipment Utilized During Treatment: Gait belt Activity Tolerance: Patient tolerated treatment well Patient left: in bed;with call bell/phone within reach;with nursing/sitter in room Nurse Communication: Mobility status PT Visit Diagnosis: Ataxic gait (R26.0)    Time: 8366-2947 PT Time Calculation (min) (ACUTE ONLY): 32  min   Charges:   PT Evaluation $PT Eval Moderate Complexity: 1 Mod PT Treatments $Gait Training: 8-22 mins   PT G Codes:        Dare Sanger L. Tamala Julian, [place] Pager [phone] 04/22/2017   Galen Manila 04/22/2017, 1:40 PM

## 2017-04-22 NOTE — Evaluation (Signed)
Occupational Therapy Evaluation Patient Details Name: Dawn Peters MRN: [id number] DOB: 08/19/1980 Today's Date: 04/22/2017    History of Present Illness This 37 y.o. female admitted with AMS and worsening Lt LE weakness.  Dx: MS flare with  Acute metabolic encephalopathy, Lt LE weakness; AKI, leukocytosis   Clinical Impression   Pt admitted with above. She demonstrates the below listed deficits and will benefit from continued OT to maximize safety and independence with BADLs.  Pt presents to OT with impaired balance, decreased activity tolerance, tremor bil. UEs.  She requires min guard assist to min A for ADLs and min guard assist with functional mobility.  She lives with fiance' and adult son who can provide 24 hour supervision/assist at discharge.  She lives In second floor apt and has full flight of steps to negotiate to enter her home.   Recommend 24 hour supervision at discharge.  Will follow.       Follow Up Recommendations  Home health OT;Supervision/Assistance - 24 hour    Equipment Recommendations  Tub/shower seat    Recommendations for Other Services       Precautions / Restrictions Precautions Precautions: Fall      Mobility Bed Mobility Overal bed mobility: Needs Assistance Bed Mobility: Supine to Sit     Supine to sit: Supervision        Transfers Overall transfer level: Needs assistance Equipment used: Rolling walker (2 wheeled);1 person hand held assist Transfers: Sit to/from Stand;Stand Pivot Transfers Sit to Stand: Min guard Stand pivot transfers: Min guard       General transfer comment: min guard to steady     Balance Overall balance assessment: Needs assistance Sitting-balance support: Feet supported Sitting balance-Leahy Scale: Good     Standing balance support: During functional activity;No upper extremity supported Standing balance-Leahy Scale: Poor Standing balance comment: requires min guard assist due to occasional light min A  due to decreased balance.                            ADL either performed or assessed with clinical judgement   ADL Overall ADL's : Needs assistance/impaired Eating/Feeding: Independent   Grooming: Wash/dry hands;Wash/dry face;Oral care;Brushing hair;Applying deodorant;Set up;Min guard;Sitting;Standing Grooming Details (indicate cue type and reason): set up while sitting, min guard assist while standing  Upper Body Bathing: Min guard;Standing   Lower Body Bathing: Minimal assistance;Sit to/from stand Lower Body Bathing Details (indicate cue type and reason): occasional min A due mild balance loss while standing to bathe  Upper Body Dressing : Set up;Sitting   Lower Body Dressing: Minimal assistance;Sit to/from stand Lower Body Dressing Details (indicate cue type and reason): occasional mild LOB  Toilet Transfer: Min guard;Ambulation;Comfort height toilet;Grab bars;RW   Toileting- Water quality scientist and Hygiene: Min guard;Sit to/from stand       Functional mobility during ADLs: Min guard;Rolling walker       Vision Patient Visual Report: No change from baseline Vision Assessment?: No apparent visual deficits     Perception     Praxis      Pertinent Vitals/Pain Pain Assessment: No/denies pain     Hand Dominance Right   Extremity/Trunk Assessment Upper Extremity Assessment Upper Extremity Assessment: RUE deficits/detail;LUE deficits/detail RUE Deficits / Details: tremulous  RUE Coordination: decreased fine motor LUE Deficits / Details: tremulous  LUE Coordination: decreased fine motor   Lower Extremity Assessment Lower Extremity Assessment: Defer to PT evaluation   Cervical / Trunk Assessment Cervical /  Trunk Assessment: Normal   Communication Communication Communication: No difficulties   Cognition Arousal/Alertness: Awake/alert Behavior During Therapy: WFL for tasks assessed/performed Overall Cognitive Status: Within Functional Limits for  tasks assessed                                     General Comments  Pt reports improving sypmtoms, but is not back to baseline     Exercises     Shoulder Instructions      Home Living Family/patient expects to be discharged to:: Private residence Living Arrangements: Spouse/significant other;Children Available Help at Discharge: Family Type of Home: Apartment Home Access: Stairs to enter Technical brewer of Steps: full flight  Entrance Stairs-Rails: Left;Right Home Layout: Two level;Full bath on main level Alternate Level Stairs-Number of Steps: full flight    Bathroom Shower/Tub: Tub/shower unit;Curtain   Bathroom Toilet: Standard     Home Equipment: Environmental consultant - 4 wheels   Additional Comments: LIves with finace' and son       Prior Functioning/Environment Level of Independence: Independent        Comments: Pt denies falls (although in June, she reports falls with exacerbations).   Pt does not work, drives, grocery shops.  Does not use AD for ambulation         OT Problem List: Decreased strength;Impaired balance (sitting and/or standing);Decreased activity tolerance;Decreased safety awareness;Decreased knowledge of use of DME or AE      OT Treatment/Interventions: Self-care/ADL training;DME and/or AE instruction;Therapeutic activities;Patient/family education;Balance training;Therapeutic exercise;Neuromuscular education    OT Goals(Current goals can be found in the care plan section) Acute Rehab OT Goals Patient Stated Goal: to get back to normal  OT Goal Formulation: With patient Time For Goal Achievement: 05/06/17 Potential to Achieve Goals: Good ADL Goals Pt Will Perform Grooming: with modified independence;standing Pt Will Perform Upper Body Bathing: with modified independence;sitting Pt Will Perform Lower Body Bathing: with modified independence;sit to/from stand Pt Will Perform Upper Body Dressing: with set-up;sitting Pt Will Perform  Lower Body Dressing: with modified independence;sit to/from stand Pt Will Transfer to Toilet: with modified independence;ambulating;regular height toilet;grab bars;bedside commode Pt Will Perform Toileting - Clothing Manipulation and hygiene: with modified independence;sit to/from stand Pt Will Perform Tub/Shower Transfer: Tub transfer;with min guard assist;ambulating;shower seat;rolling walker  OT Frequency: Min 2X/week   Barriers to D/C:            Co-evaluation              AM-PAC PT "6 Clicks" Daily Activity     Outcome Measure Help from another person eating meals?: None Help from another person taking care of personal grooming?: A Little Help from another person toileting, which includes using toliet, bedpan, or urinal?: A Little Help from another person bathing (including washing, rinsing, drying)?: A Little Help from another person to put on and taking off regular upper body clothing?: A Little Help from another person to put on and taking off regular lower body clothing?: A Little 6 Click Score: 19   End of Session Equipment Utilized During Treatment: Rolling walker;Gait belt Nurse Communication: Mobility status  Activity Tolerance: Patient tolerated treatment well Patient left: in chair;with call bell/phone within reach;with chair alarm set;with nursing/sitter in room  OT Visit Diagnosis: Unsteadiness on feet (R26.81)                Time: 0254-2706 OT Time Calculation (min): 45 min Charges:  OT General  Charges $OT Visit: 1 Visit OT Evaluation $OT Eval Moderate Complexity: 1 Mod OT Treatments $Self Care/Home Management : 23-37 mins G-Codes:     [facility], OTR/L [phone]   Lucille Passy M 04/22/2017, 1:07 PM

## 2017-04-22 NOTE — Progress Notes (Signed)
HR increased 128 sustained. Iv access has been lost. Iv team contacted to assist. Provider contacted. Order received to administer metoprolol 5 mg iv only if hr continues to sustain 121-130

## 2017-04-22 NOTE — Progress Notes (Signed)
Patient ID: Dawn Peters, female   DOB: 09-28-1980, 37 y.o.   MRN: [id number]    PROGRESS NOTE  Dawn Peters  [id number]:[id number] DOB: 11/06/1980 DOA: 04/19/2017  PCP: Javier Docker, MD   Brief Narrative:  Patient is 37 year old female with known multiple sclerosis, at baseline left foot drop and left sided weakness, hypertension and anxiety, presented to emergency department with acute altered mental status as well as worsening left leg weakness. Patient reports noticing that her left foot was getting weaker and she was dragging and more. She suddenly became more lethargic and somnolent. Patient explains she has never had similar event in the past and is not sure what brought this on. She has not started any new medications and has been compliant with recommended regimen.  While in emergency department, patient was somnolent and minimally verbal, waxing and waning episodes of agitation, hallucinations. CT head and MRI head showed chronic demyelination unchanged from previous scans. Urine drug screen and alcohol level were negative.  Assessment & Plan:  Acute metabolic encephalopathy - Appears to be secondary to MS flare in the setting of possible drug overdose - Patient is on multiple medications at home, some meds more sedating, including baclofen, Neurontin, oxycodone, amitriptyline, Zanaflex - mental status stable this AM  - pending PT eval  Left lower extremity weakness - pt reports she feels overall better  - Appreciate neurology team following  - Continue Solu-Medrol thousand milligrams IV for total 3 days, today is day #3 - If no improvement, will consider MRI of C-spine, if better in next 24 hours, will go home  - Neurology follow-up with Dr. Felecia Shelling  Acute kidney injury  - pre renal in etiology  - improving  - BMP in AM  Leukocytosis - reactive from steroid use - will repeat CBC in AM   DVT prophylaxis:  Lovenox SQ  Code Status:  full code  Family Communication:  pt at bedside  Disposition Plan:  Will go home in AM if better   Consultants:  Neurology   Procedures:   None   Antimicrobials:   None   Subjective: Pt reports feeling better.   Objective: Vitals:   04/21/17 1651 04/21/17 2039 04/22/17 0048 04/22/17 0659  BP: (!) 161/100 (!) 144/86 130/90 (!) 164/99  Pulse: (!) 130 (!) 118 89 74  Resp:  18 18 20   Temp:  99.4 F (37.4 C) 99 F (37.2 C) 98.1 F (36.7 C)  TempSrc:  Oral Oral Oral  SpO2:  100% 97% 98%  Weight:      Height:        Intake/Output Summary (Last 24 hours) at 04/22/2017 1048 Last data filed at 04/22/2017 0707 Gross per 24 hour  Intake 1475 ml  Output 1600 ml  Net -125 ml   Filed Weights   04/20/17 2140  Weight: 91.2 kg (201 lb 1 oz)    Physical Exam  Constitutional: Appears well-developed and well-nourished. No distress.  CVS: RRR, S1/S2 +, no murmurs, no gallops, no carotid bruit.  Pulmonary: Effort and breath sounds normal, no stridor, rhonchi, wheezes, rales.  Abdominal: Soft. BS +,  no distension, tenderness, rebound or guarding.  Musculoskeletal: No edema and no tenderness. Improving strength in LLE, stable on the right side    Data Reviewed: I have personally reviewed following labs and imaging studies  CBC: Recent Labs  Lab 04/20/17 0047 04/20/17 0101 04/21/17 0501 04/22/17 0454  WBC 9.0  --  9.3 11.1*  NEUTROABS 8.6*  --   --   --  HGB 12.3 12.9 12.5 11.9*  HCT 36.2 38.0 36.4 35.3*  MCV 95.3  --  95.0 95.1  PLT 256  --  235 462   Basic Metabolic Panel: Recent Labs  Lab 04/20/17 0047 04/20/17 0101 04/20/17 0752 04/21/17 0501 04/22/17 0454  NA 143 142 142 136 141  K 4.2 4.2 4.7 3.9 3.7  CL 108 107 108 102 106  CO2 25  --  25 23 25   GLUCOSE 131* 126* 116* 140* 145*  BUN 19 18 17 18  24*  CREATININE 1.66* 1.60* 1.45* 1.30* 1.17*  CALCIUM 8.8*  --  9.1 9.0 9.0   Liver Function Tests: Recent Labs  Lab 04/20/17 0047  AST 14*  ALT 12*  ALKPHOS 95  BILITOT 0.3  PROT  7.4  ALBUMIN 3.9   Coagulation Profile: Recent Labs  Lab 04/20/17 0047  INR 0.95   HbA1C: Recent Labs    04/20/17 0047  HGBA1C 5.3   Lipid Profile: Recent Labs    04/20/17 0047  CHOL 228*  HDL 77  LDLCALC 142*  TRIG 46  CHOLHDL 3.0   Urine analysis:    Component Value Date/Time   COLORURINE STRAW (A) 04/20/2017 0105   APPEARANCEUR CLEAR 04/20/2017 0105   APPEARANCEUR Clear 04/19/2016 1419   LABSPEC 1.006 04/20/2017 0105   PHURINE 6.0 04/20/2017 0105   GLUCOSEU NEGATIVE 04/20/2017 0105   HGBUR SMALL (A) 04/20/2017 0105   BILIRUBINUR NEGATIVE 04/20/2017 0105   BILIRUBINUR Negative 04/19/2016 1419   KETONESUR NEGATIVE 04/20/2017 0105   PROTEINUR NEGATIVE 04/20/2017 0105   UROBILINOGEN 0.2 12/15/2016 1136   NITRITE NEGATIVE 04/20/2017 0105   LEUKOCYTESUR NEGATIVE 04/20/2017 0105   LEUKOCYTESUR Negative 04/19/2016 1419   Recent Results (from the past 240 hour(s))  MRSA PCR Screening     Status: None   Collection Time: 04/20/17  9:38 PM  Result Value Ref Range Status   MRSA by PCR NEGATIVE NEGATIVE Final    Comment:        The GeneXpert MRSA Assay (FDA approved for NASAL specimens only), is one component of a comprehensive MRSA colonization surveillance program. It is not intended to diagnose MRSA infection nor to guide or monitor treatment for MRSA infections. Performed at [facility], [place] [address], [place], [place] [address]     Radiology Studies: No results found.  Scheduled Meds: .  stroke: mapping our early stages of recovery book   Does not apply Once  . amitriptyline  25 mg Oral QHS  . amLODipine  5 mg Oral Daily  . amphetamine-dextroamphetamine  20 mg Oral Daily  . busPIRone  15 mg Oral BID  . chlorhexidine  15 mL Mouth Rinse BID  . citalopram  40 mg Oral Daily  . dalfampridine  10 mg Oral BID  . enoxaparin (LOVENOX) injection  40 mg Subcutaneous Q24H  . gabapentin  800 mg Oral TID  . LORazepam  0.5 mg Intravenous  Once  . mouth rinse  15 mL Mouth Rinse q12n4p  . Oxcarbazepine  300 mg Oral BID  . oxyCODONE-acetaminophen  1 tablet Oral QID  . tamsulosin  0.4 mg Oral Daily  . tiZANidine  4 mg Oral QHS  . valACYclovir  500 mg Oral BID   Continuous Infusions: . sodium chloride 75 mL/hr at 04/22/17 0513  . methylPREDNISolone (SOLU-MEDROL) injection Stopped (04/21/17 1622)     LOS: 2 days   Time spent: 25 minutes  25 minutes with > 50% of time discussing current diagnostic test results,  clinical impression and plan of care.  Faye Ramsay, MD [facility] Pager [phone]  If 7PM-7AM, please contact night-coverage [email or web address] Password [facility] 04/22/2017, 10:48 AM

## 2017-04-23 LAB — CBC
HCT: 34.6 % — ABNORMAL LOW (ref 36.0–46.0)
HEMOGLOBIN: 11.8 g/dL — AB (ref 12.0–15.0)
MCH: 32.2 pg (ref 26.0–34.0)
MCHC: 34.1 g/dL (ref 30.0–36.0)
MCV: 94.5 fL (ref 78.0–100.0)
PLATELETS: 229 10*3/uL (ref 150–400)
RBC: 3.66 MIL/uL — ABNORMAL LOW (ref 3.87–5.11)
RDW: 16 % — AB (ref 11.5–15.5)
WBC: 8.8 10*3/uL (ref 4.0–10.5)

## 2017-04-23 LAB — BASIC METABOLIC PANEL
Anion gap: 11 (ref 5–15)
BUN: 25 mg/dL — ABNORMAL HIGH (ref 6–20)
CALCIUM: 8.6 mg/dL — AB (ref 8.9–10.3)
CHLORIDE: 104 mmol/L (ref 101–111)
CO2: 24 mmol/L (ref 22–32)
CREATININE: 1.23 mg/dL — AB (ref 0.44–1.00)
GFR calc non Af Amer: 55 mL/min — ABNORMAL LOW (ref >60)
Glucose, Bld: 145 mg/dL — ABNORMAL HIGH (ref 65–99)
Potassium: 3.6 mmol/L (ref 3.5–5.1)
SODIUM: 139 mmol/L (ref 135–145)

## 2017-04-23 MED ORDER — TRAZODONE HCL 50 MG PO TABS
50.0000 mg | ORAL_TABLET | Freq: Once | ORAL | Status: AC
  Administered 2017-04-23: 50 mg via ORAL
  Filled 2017-04-23: qty 1

## 2017-04-23 MED ORDER — METOPROLOL TARTRATE 25 MG PO TABS
12.5000 mg | ORAL_TABLET | Freq: Two times a day (BID) | ORAL | Status: DC
  Administered 2017-04-23 – 2017-04-24 (×3): 12.5 mg via ORAL
  Filled 2017-04-23 (×3): qty 1

## 2017-04-23 MED ORDER — OXYCODONE-ACETAMINOPHEN 5-325 MG PO TABS: 1.0000 | ORAL_TABLET | Freq: Four times a day (QID) | ORAL | 0 refills | Status: DC

## 2017-04-23 NOTE — Progress Notes (Signed)
Patient ambulated from bed to bathroom. HR increased 180. HR sustained 140-160 for 10 mins. Will update provider, BP 162/114

## 2017-04-23 NOTE — Progress Notes (Signed)
Patient ID: Dawn Peters, female   DOB: [birth year], 37 y.o.   MRN: [id number]    PROGRESS NOTE  Dawn Peters  VFI:[id number] DOB: 10-09-1980 DOA: 04/19/2017  PCP: Javier Docker, MD   Brief Narrative:  Patient is 37 year old female with known multiple sclerosis, at baseline left foot drop and left sided weakness, hypertension and anxiety, presented to emergency department with acute altered mental status as well as worsening left leg weakness. Patient reports noticing that her left foot was getting weaker and she was dragging and more. She suddenly became more lethargic and somnolent. Patient explains she has never had similar event in the past and is not sure what brought this on. She has not started any new medications and has been compliant with recommended regimen.  While in emergency department, patient was somnolent and minimally verbal, waxing and waning episodes of agitation, hallucinations. CT head and MRI head showed chronic demyelination unchanged from previous scans. Urine drug screen and alcohol level were negative.  Assessment & Plan:  Acute metabolic encephalopathy - Appears to be secondary to MS flare in the setting of possible drug overdose - Patient is on multiple medications at home, some meds more sedating, including baclofen, Neurontin, oxycodone, amitriptyline, Zanaflex - mental status stable this AM - HH PT recommended   Left lower extremity weakness - pt reports feeling better overall  - Appreciate neurology team following  - Continue Solu-Medrol thousand milligrams IV for total 3 days, today is day #3 - If no improvement, will consider MRI of C-spine, if better in next 24 hours, will go home  - Neurology follow-up with Dr. Felecia Shelling  Acute kidney injury  - pre renal in etiology  - slightly worse this AM - encouraged oral intake - BMP in AM  Tachycardia - unclear etiology, could be related to Solumedrol - place on low dose Metoprolol and monitor  response   Leukocytosis - reactive from steroid use - improving and now WNL   DVT prophylaxis:  Lovenox SQ  Code Status:  full code  Family Communication: pt at bedside  Disposition Plan:  Home in AM if HR stable   Consultants:  Neurology   Procedures:   None   Antimicrobials:   None   Subjective: Pt reports feeling tired but overall better this am, tolerating diet well.   Objective: Vitals:   04/23/17 0135 04/23/17 0558 04/23/17 0935 04/23/17 1200  BP: (!) 145/98 (!) 152/99 137/90 (!) 162/114  Pulse: 91 81  (!) 122  Resp: 18 18  18   Temp: 98.3 F (36.8 C) 98.8 F (37.1 C)    TempSrc: Oral Oral    SpO2: 96% 98%    Weight:      Height:        Intake/Output Summary (Last 24 hours) at 04/23/2017 1325 Last data filed at 04/23/2017 0559 Gross per 24 hour  Intake -  Output 450 ml  Net -450 ml   Filed Weights   04/20/17 2140  Weight: 91.2 kg (201 lb 1 oz)   Physical Exam  Constitutional: Appears well-developed and well-nourished. No distress.  CVS: Tachycardic, S1/S2 +, no murmurs, no gallops, no carotid bruit.  Pulmonary: Effort and breath sounds normal, no stridor, rhonchi, wheezes, rales.  Abdominal: Soft. BS +,  no distension, tenderness, rebound or guarding.  Musculoskeletal: Normal range of motion. Still week in the LLE but improving   Data Reviewed: I have personally reviewed following labs and imaging studies  CBC: Recent Labs  Lab 04/20/17  [id number] 04/20/17 0101 04/21/17 0501 04/22/17 0454 04/23/17 0535  WBC 9.0  --  9.3 11.1* 8.8  NEUTROABS 8.6*  --   --   --   --   HGB 12.3 12.9 12.5 11.9* 11.8*  HCT 36.2 38.0 36.4 35.3* 34.6*  MCV 95.3  --  95.0 95.1 94.5  PLT 256  --  235 228 696   Basic Metabolic Panel: Recent Labs  Lab 04/20/17 0047 04/20/17 0101 04/20/17 0752 04/21/17 0501 04/22/17 0454 04/23/17 0535  NA 143 142 142 136 141 139  K 4.2 4.2 4.7 3.9 3.7 3.6  CL 108 107 108 102 106 104  CO2 25  --  25 23 25 24   GLUCOSE 131* 126*  116* 140* 145* 145*  BUN 19 18 17 18  24* 25*  CREATININE 1.66* 1.60* 1.45* 1.30* 1.17* 1.23*  CALCIUM 8.8*  --  9.1 9.0 9.0 8.6*   Liver Function Tests: Recent Labs  Lab 04/20/17 0047  AST 14*  ALT 12*  ALKPHOS 95  BILITOT 0.3  PROT 7.4  ALBUMIN 3.9   Coagulation Profile: Recent Labs  Lab 04/20/17 0047  INR 0.95   Urine analysis:    Component Value Date/Time   COLORURINE STRAW (A) 04/20/2017 0105   APPEARANCEUR CLEAR 04/20/2017 0105   APPEARANCEUR Clear 04/19/2016 1419   LABSPEC 1.006 04/20/2017 0105   PHURINE 6.0 04/20/2017 0105   GLUCOSEU NEGATIVE 04/20/2017 0105   HGBUR SMALL (A) 04/20/2017 0105   BILIRUBINUR NEGATIVE 04/20/2017 0105   BILIRUBINUR Negative 04/19/2016 1419   KETONESUR NEGATIVE 04/20/2017 0105   PROTEINUR NEGATIVE 04/20/2017 0105   UROBILINOGEN 0.2 12/15/2016 1136   NITRITE NEGATIVE 04/20/2017 0105   LEUKOCYTESUR NEGATIVE 04/20/2017 0105   LEUKOCYTESUR Negative 04/19/2016 1419   Recent Results (from the past 240 hour(s))  MRSA PCR Screening     Status: None   Collection Time: 04/20/17  9:38 PM  Result Value Ref Range Status   MRSA by PCR NEGATIVE NEGATIVE Final    Comment:        The GeneXpert MRSA Assay (FDA approved for NASAL specimens only), is one component of a comprehensive MRSA colonization surveillance program. It is not intended to diagnose MRSA infection nor to guide or monitor treatment for MRSA infections. Performed at [facility], [place] [address] [place], [place] [address]     Radiology Studies: No results found.  Scheduled Meds: .  stroke: mapping our early stages of recovery book   Does not apply Once  . amitriptyline  25 mg Oral QHS  . amLODipine  5 mg Oral Daily  . amphetamine-dextroamphetamine  20 mg Oral Daily  . busPIRone  15 mg Oral BID  . chlorhexidine  15 mL Mouth Rinse BID  . citalopram  40 mg Oral Daily  . dalfampridine  10 mg Oral BID  . enoxaparin (LOVENOX) injection  40 mg  Subcutaneous Q24H  . gabapentin  800 mg Oral TID  . LORazepam  0.5 mg Intravenous Once  . mouth rinse  15 mL Mouth Rinse q12n4p  . metoprolol tartrate  5 mg Intravenous Once  . metoprolol tartrate  12.5 mg Oral BID  . Oxcarbazepine  300 mg Oral BID  . oxyCODONE-acetaminophen  1 tablet Oral QID  . tamsulosin  0.4 mg Oral Daily  . tiZANidine  4 mg Oral QHS  . valACYclovir  500 mg Oral BID   Continuous Infusions:    LOS: 3 days   Time spent: 25 minutes  25 minutes  with > 50% of time discussing current diagnostic test results, clinical impression and plan of care.  Faye Ramsay, MD [facility] Pager [phone]  If 7PM-7AM, please contact night-coverage [email or web address] Password [email or web address] 04/23/2017, 1:25 PM

## 2017-04-24 LAB — BASIC METABOLIC PANEL
ANION GAP: 9 (ref 5–15)
BUN: 24 mg/dL — ABNORMAL HIGH (ref 6–20)
CHLORIDE: 102 mmol/L (ref 101–111)
CO2: 28 mmol/L (ref 22–32)
Calcium: 8.6 mg/dL — ABNORMAL LOW (ref 8.9–10.3)
Creatinine, Ser: 1.22 mg/dL — ABNORMAL HIGH (ref 0.44–1.00)
GFR calc non Af Amer: 55 mL/min — ABNORMAL LOW (ref >60)
GLUCOSE: 106 mg/dL — AB (ref 65–99)
Potassium: 3.4 mmol/L — ABNORMAL LOW (ref 3.5–5.1)
Sodium: 139 mmol/L (ref 135–145)

## 2017-04-24 LAB — CBC
HCT: 37 % (ref 36.0–46.0)
Hemoglobin: 12.3 g/dL (ref 12.0–15.0)
MCH: 31.8 pg (ref 26.0–34.0)
MCHC: 33.2 g/dL (ref 30.0–36.0)
MCV: 95.6 fL (ref 78.0–100.0)
PLATELETS: 243 10*3/uL (ref 150–400)
RBC: 3.87 MIL/uL (ref 3.87–5.11)
RDW: 15.9 % — ABNORMAL HIGH (ref 11.5–15.5)
WBC: 7.9 10*3/uL (ref 4.0–10.5)

## 2017-04-24 MED ORDER — ACETAMINOPHEN 325 MG PO TABS: 650.0000 mg | ORAL_TABLET | ORAL | Status: DC | PRN

## 2017-04-24 MED ORDER — ALUM & MAG HYDROXIDE-SIMETH 200-200-20 MG/5ML PO SUSP
30.0000 mL | ORAL | Status: DC | PRN
  Administered 2017-04-24 (×2): 30 mL via ORAL
  Filled 2017-04-24 (×2): qty 30

## 2017-04-24 MED ORDER — METOPROLOL TARTRATE 25 MG PO TABS: 12.5000 mg | ORAL_TABLET | Freq: Two times a day (BID) | ORAL | 0 refills | Status: DC

## 2017-04-24 MED ORDER — POTASSIUM CHLORIDE CRYS ER 20 MEQ PO TBCR
40.0000 meq | EXTENDED_RELEASE_TABLET | Freq: Once | ORAL | Status: AC
  Administered 2017-04-24: 40 meq via ORAL
  Filled 2017-04-24: qty 2

## 2017-04-24 NOTE — Discharge Instructions (Signed)
Multiple Sclerosis °Multiple sclerosis (MS) is a disease of the central nervous system. It leads to the loss of the insulating covering of the nerves (myelin sheath) of your brain. When this happens, brain signals do not get sent properly or may not get sent at all. The age of onset of MS varies. °What are the causes? °The cause of MS is unknown. However, it is more common in the northern [place] than in the southern [place]. °What increases the risk? °There is a higher number of women with MS than men. MS is not an illness that is passed down to you from your family members (inherited). However, your risk of MS is higher if you have a relative with MS. °What are the signs or symptoms? °The symptoms of MS occur in episodes or attacks. These attacks may last weeks to months. There may be long periods of almost no symptoms between attacks. The symptoms of MS vary. This is because of the many different ways it affects the central nervous system. The main symptoms of MS include: °· Vision problems and eye pain. °· Numbness. °· Weakness. °· Inability to move your arms, hands, feet, or legs (paralysis). °· Balance problems. °· Tremors. °How is this diagnosed? °Your health care provider can diagnose MS with the help of imaging exams and lab tests. These may include specialized X-ray exams and spinal fluid tests. The best imaging exam to confirm a diagnosis of MS is an MRI. °How is this treated? °There is no known cure for MS, but there are medicines that can decrease the number and frequency of attacks. Steroids are often used for short-term relief. Physical and occupational therapy may also help. There are also many new alternative or complementary treatments available to help control the symptoms of MS. Ask your health care provider if any of these other options are right for you. °Follow these instructions at home: °· Take medicines as directed by your health care provider. °· Exercise as directed by your  health care provider. °Contact a health care provider if: °You begin to feel depressed. °Get help right away if: °· You develop paralysis. °· You have problems with bladder, bowel, or sexual function. °· You develop mental changes, such as forgetfulness or mood swings. °· You have a period of uncontrolled movements (seizure). °This information is not intended to replace advice given to you by your health care provider. Make sure you discuss any questions you have with your health care provider. °Document Released: 02/19/2000 Document Revised: 07/30/2015 Document Reviewed: 10/29/2012 °Elsevier Interactive Patient Education © 2017 [employer] ° °

## 2017-04-24 NOTE — Discharge Summary (Signed)
Physician Discharge Summary  Dawn Peters YTK:[id number] DOB: 12-22-1980 DOA: 04/19/2017  PCP: Javier Docker, MD  Admit date: 04/19/2017 Discharge date: 04/24/2017  Recommendations for Outpatient Follow-up:  1. Pt will need to follow up with PCP in 1 week post discharge 2. Please obtain BMP to evaluate electrolytes and kidney function, K level  3. Please also check CBC to evaluate Hg and Hct levels 4. Due to intermittent tachycardia, pt was started on metoprolol. It was thought that tachycardia was due to solumedrol and now that pt has completed therapy with solumedrol, HR may stabilize, please re assess in next week and determine if Metoprolol is still needed.  5. Pt advised to follow up with her neurologist Dr. Felecia Shelling   Discharge Diagnoses:  Principal Problem:   Acute encephalopathy Active Problems:   Acute kidney injury superimposed on CKD [facility])  Discharge Condition: Stable  Diet recommendation: Heart healthy diet discussed in details   History of present illness:  Patient is 37 year old female with known multiple sclerosis, at baseline left foot drop and left sided weakness, hypertension and anxiety, presented to emergency department with acute altered mental status as well as worsening left leg weakness. Patient reports noticing that her left foot was getting weaker and she was dragging and more. She suddenly became more lethargic and somnolent. Patient explains she has never had similar event in the past and is not sure what brought this on. She has not started any new medications and has been compliant with recommended regimen.  While in emergency department, patient was somnolent and minimally verbal, waxing and waning episodes of agitation, hallucinations. CT head and MRI head showed chronic demyelination unchanged from previous scans. Urine drug screen and alcohol level were negative.  Assessment & Plan:  Acute metabolic encephalopathy - Appears to be secondary to MS  flare in the setting of possible drug overdose - Patient is on multiple medications at home, some meds more sedating, including baclofen, Neurontin, oxycodone, amitriptyline, Zanaflex - mental status stable this AM - HH PT recommended, orders placed   Left lower extremity weakness - overall much better  - Appreciate neurology team following  - pt complete 3 days therapy with solumedrol  - Neurology follow-up with Dr. Felecia Shelling  Acute kidney injury  - pre renal in etiology  - Cr overall stable  - outpatient follow up   Hypokalemia - supplemented prior to discharge, will need close outpatient follow up   Tachycardia - unclear etiology, could be related to Solumedrol - placed on Metoprolol and rate has been controlled   Leukocytosis - reactive from steroid use  DVT prophylaxis:  Lovenox SQ  Code Status:  full code  Family Communication: pt at bedside  Disposition Plan:  Home   Consultants:  Neurology   Procedures:   None   Antimicrobials:   None   Procedures/Studies: Ct Angio Head W Or Wo Contrast  Result Date: 04/20/2017 CLINICAL DATA:  Found unresponsive after drug and take and injection from pain doctor. History of multiple sclerosis, hypertension. EXAM: CT ANGIOGRAPHY HEAD AND NECK TECHNIQUE: Multidetector CT imaging of the head and neck was performed using the standard protocol during bolus administration of intravenous contrast. Multiplanar CT image reconstructions and MIPs were obtained to evaluate the vascular anatomy. Carotid stenosis measurements (when applicable) are obtained utilizing NASCET criteria, using the distal internal carotid diameter as the denominator. CONTRAST:  100 cc Isovue 370 COMPARISON:  MRI of the head and cervical spine August 13, 2016 FINDINGS: CT HEAD FINDINGS BRAIN:  No intraparenchymal hemorrhage, mass effect nor midline shift. Periventricular white matter hypodensities. The ventricles and sulci are normal. No acute large vascular  territory infarcts. No abnormal extra-axial fluid collections. Basal cisterns are patent. VASCULAR: Unremarkable. SKULL/SOFT TISSUES: No skull fracture. No significant soft tissue swelling. ORBITS/SINUSES: The included ocular globes and orbital contents are normal.The mastoid aircells and included paranasal sinuses are well-aerated. OTHER: None. CTA NECK FINDINGS: AORTIC ARCH: Normal appearance of the thoracic arch, normal branch pattern. The origins of the innominate, left Common carotid artery and subclavian artery are widely patent. RIGHT CAROTID SYSTEM: Common carotid artery is widely patent, coursing in a straight line fashion. Normal appearance of the carotid bifurcation without hemodynamically significant stenosis by NASCET criteria. Normal appearance of the internal carotid artery. LEFT CAROTID SYSTEM: Common carotid artery is widely patent, coursing in a straight line fashion. Normal appearance of the carotid bifurcation without hemodynamically significant stenosis by NASCET criteria. Normal appearance of the internal carotid artery. VERTEBRAL ARTERIES:Codominant vertebral arteries. Normal appearance of the vertebral arteries, widely patent. SKELETON: No acute osseous process though bone windows have not been submitted. OTHER NECK: Soft tissues of the neck are nonacute though, not tailored for evaluation. UPPER CHEST: Included lung apices are clear. No superior mediastinal lymphadenopathy. CTA HEAD FINDINGS: ANTERIOR CIRCULATION: Patent cervical internal carotid arteries, petrous, cavernous and supra clinoid internal carotid arteries. Patent anterior communicating artery. Patent anterior and middle cerebral arteries. No large vessel occlusion, significant stenosis, contrast extravasation or aneurysm. POSTERIOR CIRCULATION: Patent vertebral arteries, vertebrobasilar junction and basilar artery, as well as main branch vessels. Patent posterior cerebral arteries. No large vessel occlusion, significant  stenosis, contrast extravasation or aneurysm. VENOUS SINUSES: Major dural venous sinuses are patent though not tailored for evaluation on this angiographic examination. ANATOMIC VARIANTS: None. DELAYED PHASE: No abnormal intracranial enhancement. MIP images reviewed. IMPRESSION: CT HEAD: 1. No acute intracranial process. 2. Periventricular white matter changes better characterize as chronic demyelination on prior MRI. No parenchymal brain volume loss. CTA NECK: 1. Negative CTA NECK. CTA HEAD: 1. Negative CTA HEAD. Electronically Signed   By: Elon Alas M.D.   On: 04/20/2017 01:43   Ct Angio Neck W Or Wo Contrast  Result Date: 04/20/2017 CLINICAL DATA:  Found unresponsive after drug and take and injection from pain doctor. History of multiple sclerosis, hypertension. EXAM: CT ANGIOGRAPHY HEAD AND NECK TECHNIQUE: Multidetector CT imaging of the head and neck was performed using the standard protocol during bolus administration of intravenous contrast. Multiplanar CT image reconstructions and MIPs were obtained to evaluate the vascular anatomy. Carotid stenosis measurements (when applicable) are obtained utilizing NASCET criteria, using the distal internal carotid diameter as the denominator. CONTRAST:  100 cc Isovue 370 COMPARISON:  MRI of the head and cervical spine August 13, 2016 FINDINGS: CT HEAD FINDINGS BRAIN: No intraparenchymal hemorrhage, mass effect nor midline shift. Periventricular white matter hypodensities. The ventricles and sulci are normal. No acute large vascular territory infarcts. No abnormal extra-axial fluid collections. Basal cisterns are patent. VASCULAR: Unremarkable. SKULL/SOFT TISSUES: No skull fracture. No significant soft tissue swelling. ORBITS/SINUSES: The included ocular globes and orbital contents are normal.The mastoid aircells and included paranasal sinuses are well-aerated. OTHER: None. CTA NECK FINDINGS: AORTIC ARCH: Normal appearance of the thoracic arch, normal branch  pattern. The origins of the innominate, left Common carotid artery and subclavian artery are widely patent. RIGHT CAROTID SYSTEM: Common carotid artery is widely patent, coursing in a straight line fashion. Normal appearance of the carotid bifurcation without hemodynamically significant stenosis by NASCET  criteria. Normal appearance of the internal carotid artery. LEFT CAROTID SYSTEM: Common carotid artery is widely patent, coursing in a straight line fashion. Normal appearance of the carotid bifurcation without hemodynamically significant stenosis by NASCET criteria. Normal appearance of the internal carotid artery. VERTEBRAL ARTERIES:Codominant vertebral arteries. Normal appearance of the vertebral arteries, widely patent. SKELETON: No acute osseous process though bone windows have not been submitted. OTHER NECK: Soft tissues of the neck are nonacute though, not tailored for evaluation. UPPER CHEST: Included lung apices are clear. No superior mediastinal lymphadenopathy. CTA HEAD FINDINGS: ANTERIOR CIRCULATION: Patent cervical internal carotid arteries, petrous, cavernous and supra clinoid internal carotid arteries. Patent anterior communicating artery. Patent anterior and middle cerebral arteries. No large vessel occlusion, significant stenosis, contrast extravasation or aneurysm. POSTERIOR CIRCULATION: Patent vertebral arteries, vertebrobasilar junction and basilar artery, as well as main branch vessels. Patent posterior cerebral arteries. No large vessel occlusion, significant stenosis, contrast extravasation or aneurysm. VENOUS SINUSES: Major dural venous sinuses are patent though not tailored for evaluation on this angiographic examination. ANATOMIC VARIANTS: None. DELAYED PHASE: No abnormal intracranial enhancement. MIP images reviewed. IMPRESSION: CT HEAD: 1. No acute intracranial process. 2. Periventricular white matter changes better characterize as chronic demyelination on prior MRI. No parenchymal brain  volume loss. CTA NECK: 1. Negative CTA NECK. CTA HEAD: 1. Negative CTA HEAD. Electronically Signed   By: Elon Alas M.D.   On: 04/20/2017 01:43   Mr Jeri Cos And Wo Contrast  Result Date: 04/20/2017 CLINICAL DATA:  History of MS, unable to ambulate, confusion with slurred speech. EXAM: MRI HEAD WITHOUT AND WITH CONTRAST TECHNIQUE: Multiplanar, multiecho pulse sequences of the brain and surrounding structures were obtained without and with intravenous contrast. CONTRAST:  66mL MULTIHANCE GADOBENATE DIMEGLUMINE 529 MG/ML IV SOLN COMPARISON:  CT head 04/20/2017.  MR head 08/13/2016 and 06/13/2015. FINDINGS: Brain: No evidence for acute infarction, hemorrhage, mass lesion, hydrocephalus, or extra-axial fluid. Normal for age cerebral volume. T2 and FLAIR hyperintensities are largely confluent, periventricular greater than subcortical, without restriction suggesting chronic multiple sclerosis. Post infusion, no abnormal enhancement of the brain or meninges, with specific attention to the areas of demyelinating disease. No new or acute lesions are observed. Vascular: Normal flow voids. Skull and upper cervical spine: Normal marrow signal. Sinuses/Orbits: Negative. Other: None. IMPRESSION: Chronic cerebral MS, stable MRI appearance from 2017/2018. No areas of new/acute plaque activity or abnormal postcontrast enhancement. Electronically Signed   By: Staci Righter M.D.   On: 04/20/2017 07:37   Dg Chest Portable 1 View  Result Date: 04/20/2017 CLINICAL DATA:  Altered mental status. History of multiple sclerosis. EXAM: PORTABLE CHEST 1 VIEW COMPARISON:  Chest radiograph November 18, 2015 FINDINGS: Cardiomediastinal silhouette is unremarkable for this low inspiratory examination with crowded vasculature markings. The lungs are clear without pleural effusions or focal consolidations. Trachea projects midline and there is no pneumothorax. Included soft tissue planes and osseous structures are non-suspicious.  IMPRESSION: No active disease. Electronically Signed   By: Elon Alas M.D.   On: 04/20/2017 02:39    Discharge Exam: Vitals:   04/24/17 0211 04/24/17 0549  BP: 126/78 (!) 143/97  Pulse: 86 85  Resp: 18 20  Temp: 98.2 F (36.8 C) 98.1 F (36.7 C)  SpO2: 98% 98%   Vitals:   04/23/17 1341 04/23/17 2042 04/24/17 0211 04/24/17 0549  BP: (!) 150/104 (!) 158/97 126/78 (!) 143/97  Pulse: (!) 106 (!) 102 86 85  Resp: 18 18 18 20   Temp: 98.3 F (36.8 C) 98.8 F (  37.1 C) 98.2 F (36.8 C) 98.1 F (36.7 C)  TempSrc: Oral Oral Oral Oral  SpO2: 100% 100% 98% 98%  Weight:      Height:        General: Pt is alert, follows commands appropriately, not in acute distress Cardiovascular: Regular rate and rhythm, S1/S2 +, no murmurs, no rubs, no gallops Respiratory: Clear to auscultation bilaterally, no wheezing, no crackles, no rhonchi Abdominal: Soft, non tender, non distended, bowel sounds +, no guarding Extremities: no edema, no cyanosis, pulses palpable bilaterally DP and PT  Discharge Instructions  Discharge Instructions    Diet - low sodium heart healthy   Complete by:  As directed    Increase activity slowly   Complete by:  As directed      Allergies as of 04/24/2017      Reactions   Ace Inhibitors Swelling   Lisinopril Swelling   Lower lip swelling      Medication List    STOP taking these medications   HYDROcodone-acetaminophen 5-325 MG tablet Commonly known as:  NORCO/VICODIN   methylPREDNISolone 4 MG Tbpk tablet Commonly known as:  MEDROL DOSEPAK   traMADol 300 MG 24 hr tablet Commonly known as:  ULTRAM-ER     TAKE these medications   acetaminophen 325 MG tablet Commonly known as:  TYLENOL Take 2 tablets (650 mg total) by mouth every 4 (four) hours as needed for mild pain (or temp > 37.5 C (99.5 F)).   amitriptyline 25 MG tablet Commonly known as:  ELAVIL TAKE ONE OR TWO AT BEDTIME What changed:  See the new instructions.   amLODipine 5 MG  tablet Commonly known as:  NORVASC Take 1 tablet (5 mg total) daily by mouth.   amphetamine-dextroamphetamine 20 MG 24 hr capsule Commonly known as:  ADDERALL XR Take 1 capsule (20 mg total) by mouth daily.   baclofen 20 MG tablet Commonly known as:  LIORESAL Take 1 tablet (20 mg total) by mouth 3 (three) times daily as needed (for ms). What changed:  when to take this   busPIRone 15 MG tablet Commonly known as:  BUSPAR Take 1 tablet (15 mg total) by mouth 2 (two) times daily.   citalopram 40 MG tablet Commonly known as:  CELEXA Take 1 tablet (40 mg total) by mouth daily.   dalfampridine 10 MG Tb12 Take 1 tablet (10 mg total) by mouth 2 (two) times daily.   gabapentin 800 MG tablet Commonly known as:  NEURONTIN Take 1 tablet (800 mg total) by mouth 3 (three) times daily.   lamoTRIgine 200 MG tablet Commonly known as:  LAMICTAL Take 1 tablet (200 mg total) by mouth 2 (two) times daily.   lisinopril 20 MG tablet Commonly known as:  PRINIVIL,ZESTRIL TAKE 1 TABLET BY MOUTH EVERY DAY   metoprolol tartrate 25 MG tablet Commonly known as:  LOPRESSOR Take 0.5 tablets (12.5 mg total) by mouth 2 (two) times daily.   naproxen 375 MG tablet Commonly known as:  NAPROSYN Take 1 tablet (375 mg total) by mouth 2 (two) times daily. What changed:    when to take this  reasons to take this   Oxcarbazepine 300 MG tablet Commonly known as:  TRILEPTAL Take 1 tablet (300 mg total) by mouth 2 (two) times daily.   oxyCODONE-acetaminophen 5-325 MG tablet Commonly known as:  PERCOCET/ROXICET Take 1 tablet by mouth 4 (four) times daily.   SUMAtriptan 100 MG tablet Commonly known as:  IMITREX Take 1 tablet (100 mg total) by  mouth once as needed for migraine. May repeat in 2 hours if headache persists or recurs.   tamsulosin 0.4 MG Caps capsule Commonly known as:  FLOMAX Take 0.4 mg by mouth daily.   tiZANidine 4 MG tablet Commonly known as:  ZANAFLEX TAKE 1 TABLET BY MOUTH AT  BEDTIME   TRI-LO-MARZIA 0.18/0.215/0.25 MG-25 MCG tab Generic drug:  Norgestimate-Ethinyl Estradiol Triphasic TAKE 1 TABLET BY MOUTH DAILY.   valACYclovir 500 MG tablet Commonly known as:  VALTREX Take 1 tablet (500 mg total) by mouth 2 (two) times daily.      Follow-up Information    Pavelock, Ralene Bathe, MD Follow up.   Specialty:  Internal Medicine Contact information: [address] [place] [address] [phone]        Theodis Blaze, MD Follow up.   Specialty:  Internal Medicine Why:  Please call me with any questions [phone] Contact information: [address] [place] [address] [phone]            The results of significant diagnostics from this hospitalization (including imaging, microbiology, ancillary and laboratory) are listed below for reference.     Microbiology: Recent Results (from the past 240 hour(s))  MRSA PCR Screening     Status: None   Collection Time: 04/20/17  9:38 PM  Result Value Ref Range Status   MRSA by PCR NEGATIVE NEGATIVE Final    Comment:        The GeneXpert MRSA Assay (FDA approved for NASAL specimens only), is one component of a comprehensive MRSA colonization surveillance program. It is not intended to diagnose MRSA infection nor to guide or monitor treatment for MRSA infections. Performed at [facility], [place] [address], [place],  [address]      Labs: Basic Metabolic Panel: Recent Labs  Lab 04/20/17 0752 04/21/17 0501 04/22/17 0454 04/23/17 0535 04/24/17 0530  NA 142 136 141 139 139  K 4.7 3.9 3.7 3.6 3.4*  CL 108 102 106 104 102  CO2 25 23 25 24 28   GLUCOSE 116* 140* 145* 145* 106*  BUN 17 18 24* 25* 24*  CREATININE 1.45* 1.30* 1.17* 1.23* 1.22*  CALCIUM 9.1 9.0 9.0 8.6* 8.6*   Liver Function Tests: Recent Labs  Lab 04/20/17 0047  AST 14*  ALT 12*  ALKPHOS 95  BILITOT 0.3  PROT 7.4  ALBUMIN 3.9   CBC: Recent Labs  Lab  04/20/17 0047 04/20/17 0101 04/21/17 0501 04/22/17 0454 04/23/17 0535 04/24/17 0530  WBC 9.0  --  9.3 11.1* 8.8 7.9  NEUTROABS 8.6*  --   --   --   --   --   HGB 12.3 12.9 12.5 11.9* 11.8* 12.3  HCT 36.2 38.0 36.4 35.3* 34.6* 37.0  MCV 95.3  --  95.0 95.1 94.5 95.6  PLT 256  --  235 228 229 243   SIGNED: Time coordinating discharge: 60 minutes  Faye Ramsay, MD  [facility] 04/24/2017, 9:16 AM Pager [phone]  If 7PM-7AM, please contact night-coverage [email or web address] Password [email or web address]

## 2017-04-24 NOTE — Care Management Note (Signed)
Case Management Note  Patient Details  Name: Dawn Peters MRN: [id number] Date of Birth: [birth year]  Subjective/Objective:                    Action/Plan:   Expected Discharge Date:  04/24/17               Expected Discharge Plan:  [place]  In-House Referral:     Discharge planning Services  CM Consult  Post Acute Care Choice:    Choice offered to:  Patient  DME Arranged:    DME Agency:     HH Arranged:  PT [place]:  [place]  Status of Service:  Completed, signed off  If discussed at [place] of Stay Meetings, dates discussed:    Additional Comments:  Dessa Phi, RN 04/24/2017, 10:53 AM

## 2017-04-24 NOTE — Progress Notes (Signed)
Per [facility] they can offer HHPT only-patient in agreement. MD notified. No further CM needs.

## 2017-04-24 NOTE — Progress Notes (Signed)
OT Cancellation Note  Patient Details Name: Dawn Peters MRN: [id number] DOB: 1980-11-11   Cancelled Treatment:    Reason Eval/Treat Not Completed: Other (comment). Checked on pt for OT needs as she is discharging home today. She did not have any concerns. Wants to continue to strengthen legs. Will sign off.   Billal Rollo 04/24/2017, 3:04 PM  Lesle Chris, OTR/L [phone] 04/24/2017

## 2017-04-24 NOTE — Care Management Important Message (Signed)
Important Message  Patient Details  Name: Dawn Peters MRN: [id number] Date of Birth: 10/14/1980   Medicare Important Message Given:  Yes    Kerin Salen 04/24/2017, 11:29 [place] Message  Patient Details  Name: Dawn Peters MRN: [id number] Date of Birth: 16-Jan-1981   Medicare Important Message Given:  Yes    Kerin Salen 04/24/2017, 11:29 AM

## 2017-04-26 ENCOUNTER — Telehealth: Payer: Self-pay | Admitting: Neurology

## 2017-04-26 NOTE — Telephone Encounter (Signed)
Pt is requesting a call to refill a medication but wasn't sure of the name nor dosing. I told pt there are several that Dr. Felecia Shelling fill so we would need to know which she is needing

## 2017-04-26 NOTE — Telephone Encounter (Signed)
Noted.  Will address r/f once pt. calls with name of med/fim

## 2017-04-27 ENCOUNTER — Other Ambulatory Visit: Payer: Self-pay | Admitting: Neurology

## 2017-04-27 NOTE — Telephone Encounter (Signed)
Pt called stating the name is amLODipine (NORVASC) 5 MG tablet. Requesting a call back once sent

## 2017-04-28 ENCOUNTER — Other Ambulatory Visit: Payer: Self-pay | Admitting: Neurology

## 2017-05-01 NOTE — Telephone Encounter (Signed)
Spoke with Ever who sts. she was started on a new BP med by Urgent Care, wants RAS to take over rx'ing this medication.  I have explained that RAS does not typically manage HTN--this is generally a function of the pcp.  She verbalized understanding of same, will contact pcp for this/fim

## 2017-05-01 NOTE — Telephone Encounter (Signed)
Pt called stating her BP was up and she needs medication sent to [facility]. She can be reached at [phone]

## 2017-05-03 ENCOUNTER — Other Ambulatory Visit: Payer: Self-pay | Admitting: Neurology

## 2017-05-03 DIAGNOSIS — G35 Multiple sclerosis: Secondary | ICD-10-CM

## 2017-05-03 DIAGNOSIS — Z79899 Other long term (current) drug therapy: Secondary | ICD-10-CM

## 2017-05-03 NOTE — Telephone Encounter (Signed)
Error

## 2017-05-05 ENCOUNTER — Telehealth: Payer: Self-pay | Admitting: *Deleted

## 2017-05-05 DIAGNOSIS — Z79899 Other long term (current) drug therapy: Secondary | ICD-10-CM

## 2017-05-05 DIAGNOSIS — R7989 Other specified abnormal findings of blood chemistry: Secondary | ICD-10-CM

## 2017-05-05 DIAGNOSIS — G35 Multiple sclerosis: Secondary | ICD-10-CM

## 2017-05-05 NOTE — Telephone Encounter (Signed)
Spoke with Candace and explained RAS has reviewed latest [place] labs.  Cr some high, needs to be rechecked.  She verbalized understanding of same, will come into our office next week (probably Wednesday, to have this rechecked.  Order in Epic./fim

## 2017-05-10 ENCOUNTER — Other Ambulatory Visit (INDEPENDENT_AMBULATORY_CARE_PROVIDER_SITE_OTHER): Payer: Self-pay

## 2017-05-10 DIAGNOSIS — G35 Multiple sclerosis: Secondary | ICD-10-CM

## 2017-05-10 DIAGNOSIS — Z79899 Other long term (current) drug therapy: Secondary | ICD-10-CM

## 2017-05-10 DIAGNOSIS — Z0289 Encounter for other administrative examinations: Secondary | ICD-10-CM

## 2017-05-11 LAB — BASIC METABOLIC PANEL
BUN/Creatinine Ratio: 13 (ref 9–23)
BUN: 20 mg/dL (ref 6–20)
CALCIUM: 9.8 mg/dL (ref 8.7–10.2)
CO2: 24 mmol/L (ref 20–29)
CREATININE: 1.52 mg/dL — AB (ref 0.57–1.00)
Chloride: 100 mmol/L (ref 96–106)
GFR calc Af Amer: 50 mL/min/{1.73_m2} — ABNORMAL LOW (ref >59)
GFR, EST NON AFRICAN AMERICAN: 43 mL/min/{1.73_m2} — AB (ref >59)
Glucose: 86 mg/dL (ref 65–99)
POTASSIUM: 4.1 mmol/L (ref 3.5–5.2)
Sodium: 140 mmol/L (ref 134–144)

## 2017-05-12 ENCOUNTER — Telehealth: Payer: Self-pay | Admitting: Neurology

## 2017-05-12 ENCOUNTER — Other Ambulatory Visit: Payer: Self-pay | Admitting: Neurology

## 2017-05-12 DIAGNOSIS — R7989 Other specified abnormal findings of blood chemistry: Secondary | ICD-10-CM | POA: Insufficient documentation

## 2017-05-12 NOTE — Addendum Note (Signed)
Addended by: France Ravens I on: 05/12/2017 10:05 AM   Modules accepted: Orders

## 2017-05-12 NOTE — Telephone Encounter (Signed)
Pt requesting a call back to discuss her kidney function, stating she cant remember want RN told her.

## 2017-05-12 NOTE — Telephone Encounter (Signed)
Spoke with Dawn Peters and per RAS, reviewed lab results (continued elevated s. cr.).  Advised RAS needs her to see a nephrologist to see if this is related to her [name], or possibly to her recent hospitalization.  She verbalized understanding of same and is agreeable.  Urgent referral has been faxed to [facility], including demo sheet, ins. info, lab work, last ov note/fim

## 2017-05-12 NOTE — Telephone Encounter (Signed)
Spoke with Morenike again and reviewed renal labs, explained she needs to see nephrology to determine the cause. She verbalized understanding of same/fim

## 2017-05-12 NOTE — Telephone Encounter (Signed)
Pt requesting a call to discuss her lab work, didn't want to explain any further.

## 2017-05-12 NOTE — Telephone Encounter (Signed)
As his creatinines have been increasing over the last month.  I repeated one 2 days ago and it was at 1.52.     They spoke to Candace to let her know that we need to get her in to see nephrology. I spoke to Dr. Hollie Salk to request an urgent referral and provided details of her case. Additionally we will fax information.  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

## 2017-05-12 NOTE — Telephone Encounter (Signed)
[place].  Needs to see nephrology due to continued high serum creatinine, which may be due to [place] infusions to treat her MS. 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

## 2017-05-17 ENCOUNTER — Ambulatory Visit (INDEPENDENT_AMBULATORY_CARE_PROVIDER_SITE_OTHER): Payer: Medicare Other | Admitting: Neurology

## 2017-05-17 ENCOUNTER — Other Ambulatory Visit: Payer: Self-pay

## 2017-05-17 ENCOUNTER — Encounter: Payer: Self-pay | Admitting: Neurology

## 2017-05-17 ENCOUNTER — Telehealth: Payer: Self-pay | Admitting: Neurology

## 2017-05-17 VITALS — BP 134/82 | HR 81 | Resp 18 | Ht 65.0 in | Wt 193.0 lb

## 2017-05-17 DIAGNOSIS — R5383 Other fatigue: Secondary | ICD-10-CM

## 2017-05-17 DIAGNOSIS — R269 Unspecified abnormalities of gait and mobility: Secondary | ICD-10-CM

## 2017-05-17 DIAGNOSIS — G35 Multiple sclerosis: Secondary | ICD-10-CM

## 2017-05-17 DIAGNOSIS — R29898 Other symptoms and signs involving the musculoskeletal system: Secondary | ICD-10-CM | POA: Diagnosis not present

## 2017-05-17 DIAGNOSIS — Z79899 Other long term (current) drug therapy: Secondary | ICD-10-CM | POA: Diagnosis not present

## 2017-05-17 DIAGNOSIS — R7989 Other specified abnormal findings of blood chemistry: Secondary | ICD-10-CM | POA: Diagnosis not present

## 2017-05-17 MED ORDER — PREGABALIN 200 MG PO CAPS
200.0000 mg | ORAL_CAPSULE | Freq: Two times a day (BID) | ORAL | 5 refills | Status: DC
Start: 2017-05-17 — End: 2017-09-20

## 2017-05-17 NOTE — Telephone Encounter (Signed)
Pt is asking RN Faith to call her.  Pt states since yesterday she keeps falling and she doesn't know what is going on.  Please call

## 2017-05-17 NOTE — Telephone Encounter (Signed)
Spoke with Dawn Peters.  She c/o increased falls; difficulty walking.  Is seeing Pt, sts. is taking Ampyra.  Appt. with RAS given 1130 this am/fim

## 2017-05-17 NOTE — Progress Notes (Signed)
[facility]  PATIENT: Dawn Peters DOB: [birth year]  REFERRING DOCTOR OR PCP:  Dr. Annitta Needs SOURCE: Patient, records in EMR, lab results and radiology reports in EMR, MRI images reviewed on PACS.  _________________________________   HISTORICAL  CHIEF COMPLAINT:  Chief Complaint  Patient presents with  . Multiple Sclerosis    Lemtrada 2nd yr. infusions given August 7,8,9 of 2018. Recent referral to nephrology for continued elevated creatinine.  Sts. is having more difficulty walking.  Taking Ampyra, already working with PT.  Using a cane today/fim    HISTORY OF PRESENT ILLNESS:   Dawn Peters is a 37 year-old woman who was diagnosed with MS in 2000.     Update 05/17/2017:   She had a second course of Lemtrada in August 2018.   She notes her left leg is worse.  It is weaker and she is falling more.  She was in the hospital last month due to hypersomnia and generalized weakness. No definite etiology was determined. Drug screen was negative. Urinalysis showed small amount of hemoglobin but no protein or casts.   Repeat brain MRI showed no new lesions.   She received 3 days of IV Solu-Medrol.  Admission and discharge notes were reviewed.Her creatinine has been elevated with the last reading 1.52.    She is scheduled to see nephrology next week.  She also has apparent hydronephrosis on MRI and I referred her to urology last October but she does not recall going.   She is on Ampyra and feels it helps her strength some.     Update 12/28/2016:   She had her 3 day course of Lemtrada in August. She tolerated it well. She has not had any exacerbations while on Lemtrada for the MS. Her last MRI was in June 2018 and did not show any new lesions. She continues to have difficulty with her gait due to the left leg weakness and spasticity. An AFO initially helped her walk but she is having trouble with fit that she believes is worse due to weight gain. Balance is also mildly off.  Spasticity is predominantly in the left leg she gets some benefit with baclofen and tizanidine.    She has painful dysesthesias of the left leg helped partially by gabapentin and lamotrigine. She also sees pain management for opiates. She has worsening urinary hesitancy this year and an MRI of the thoracic spine showed hydronephrosis. She has fatigue is both physical and cognitive. It is worse in the heat so she feels it is doing better than a couple months ago. Well most nights. Mood has done better with citalopram and BuSpar.  ' From 10/24/2016: MS:   She had her 3 day course of Lemtrada in the office a couple weeks ago and thee infusions went well. . She is JCV high titer positive and had switched from Tysabri to Alpine earlier.     Gait/strength:    Gait has worsened due mostly to the left leg weakness and spasticity over the last year. She got her left AFO and is starting to get used to it.   She has trouble lifting the left leg/foot.  Ba;lance is off.     She is on Ampyra with some benefit.  Arms are doing ok.  She has left sided spasticity, only partially helped by baclofen and tizanidine.  Dysesthesia:   Patient continues to report painful left leg dysesthesias. These are similar to the last visit.  .  She is currently on gabapentin  800 mg by mouth 4 times a day and lamotrigine 200 mg twice a day for the dysesthetic pain.  With her medications and tramadol she feels that the pain is tolerable.   Vision:    She denies any MS related visual problems.  Bladder:   Urinary hesitancy continues to be a problem. She gets some benefit from tamsulosin. MRI of the thoracic spine with recent hospitalization shows hydronephrosis on the left.  She has not yet been referred to urology.  Bowel is fine..  Fatigue/sleep:   She is noting more trouble with her fatigue, no occurring daily.  . She does especially poorly in the hear.  She denies sleepiness -- issues are more fatigue.   She has never tried a  stimulant.   She sleeps well most nights.  Mood:  Her depression and anxiety is better with citalopram 40 mg and buspar bid.    Anxiety is more of a problem than depression and she feels that the medications are not completely controlling this.  Cognition:   She denies significant difficulties with her cognitive abilities.   Focus is mildly reduced.  MS history:   She was diagnosed with MS in 2000 after presenting with gait difficulties, numbness and headaches. An MRI of the brain was consistent with MS. She was then started on Betaseron. She had difficulty tolerating Betaseron and at some point switched to Novantrone. She took Novantrone every 3 months for a year or so. A little later, she was placed on Tysabri and she stayed on it for a couple of years. However, she was JCV-positive and switched off. She did well on Tysabri. She had been on Tecfidera since 2015. Her MRI from June 2015 showed that there had been no changes compared to an MRI from 2014.   However, she had an exacerbation March 2017.   The 08/07/2013 MRI of the brain shows multiple T2/FLAIR hyperintense foci located in the cerebellum, middle cerebellar peduncles, pons and in the periventricular white matter of the hemispheres in a pattern and configuration consistent with chronic demyelinating plaque associated with MS. There were no acute findings. There was no change compared to the previous MRI from 01/16/2013 that was also reviewed. An MRI of the cervical spine shows subtle T2 hyperintense signal within the cord and there were no acute findings.  REVIEW OF SYSTEMS: Constitutional: No fevers, chills, sweats, or change in appetite.   Notes fatigue, some insomnia Eyes: No visual changes, double vision, eye pain Ear, nose and throat: No hearing loss, ear pain, nasal congestion, sore throat Cardiovascular: No chest pain, palpitations Respiratory: No shortness of breath at rest or with exertion.   No wheezes GastrointestinaI: No nausea,  vomiting, diarrhea, abdominal pain, fecal incontinence Genitourinary: No dysuria.   She has hesitancy. Sometimes she has difficulty emptying completely Flomax has helped urinary hesitancy. 1 x nocturia. Musculoskeletal: as above Integumentary: No rash, pruritus, skin lesions Neurological: as above Psychiatric: Mild depression at this time.  Moderate anxiety Endocrine: No palpitations, diaphoresis, change in appetite, change in weigh or increased thirst Hematologic/Lymphatic: No anemia, purpura, petechiae. Allergic/Immunologic: No itchy/runny eyes, nasal congestion, recent allergic reactions, rashes  ALLERGIES: Allergies  Allergen Reactions  . Ace Inhibitors Swelling  . Lisinopril Swelling    Lower lip swelling    HOME MEDICATIONS:  Current Outpatient Medications:  .  amitriptyline (ELAVIL) 25 MG tablet, TAKE ONE OR TWO AT BEDTIME (Patient taking differently: take 25-50mg  by mouth at bedtime), Disp: 60 tablet, Rfl: 9 .  amphetamine-dextroamphetamine (ADDERALL  XR) 20 MG 24 hr capsule, Take 1 capsule (20 mg total) by mouth daily., Disp: 30 capsule, Rfl: 0 .  baclofen (LIORESAL) 20 MG tablet, Take 1 tablet (20 mg total) by mouth 3 (three) times daily as needed (for ms). (Patient taking differently: Take 20 mg by mouth 4 (four) times daily. ), Disp: 90 each, Rfl: 11 .  busPIRone (BUSPAR) 15 MG tablet, Take 1 tablet (15 mg total) by mouth 2 (two) times daily., Disp: 60 tablet, Rfl: 11 .  citalopram (CELEXA) 40 MG tablet, Take 1 tablet (40 mg total) by mouth daily., Disp: 90 tablet, Rfl: 3 .  dalfampridine 10 MG TB12, Take 1 tablet (10 mg total) by mouth 2 (two) times daily., Disp: 60 tablet, Rfl: 11 .  gabapentin (NEURONTIN) 800 MG tablet, Take 1 tablet (800 mg total) by mouth 3 (three) times daily., Disp: 90 tablet, Rfl: 11 .  lamoTRIgine (LAMICTAL) 200 MG tablet, Take 1 tablet (200 mg total) by mouth 2 (two) times daily., Disp: 60 tablet, Rfl: 11 .  metoprolol tartrate (LOPRESSOR) 25 MG  tablet, Take 0.5 tablets (12.5 mg total) by mouth 2 (two) times daily., Disp: 14 tablet, Rfl: 0 .  naproxen (NAPROSYN) 375 MG tablet, Take 1 tablet (375 mg total) by mouth 2 (two) times daily. (Patient taking differently: Take 375 mg by mouth 2 (two) times daily as needed for mild pain. ), Disp: 20 tablet, Rfl: 0 .  Oxcarbazepine (TRILEPTAL) 300 MG tablet, Take 1 tablet (300 mg total) by mouth 2 (two) times daily., Disp: 60 tablet, Rfl: 1 .  oxyCODONE-acetaminophen (PERCOCET/ROXICET) 5-325 MG tablet, Take 1 tablet by mouth 4 (four) times daily., Disp: 30 tablet, Rfl: 0 .  SUMAtriptan (IMITREX) 100 MG tablet, Take 1 tablet (100 mg total) by mouth once as needed for migraine. May repeat in 2 hours if headache persists or recurs., Disp: 10 tablet, Rfl: 3 .  tamsulosin (FLOMAX) 0.4 MG CAPS capsule, Take 0.4 mg by mouth daily., Disp: , Rfl: 11 .  tiZANidine (ZANAFLEX) 4 MG tablet, TAKE 1 TABLET BY MOUTH AT BEDTIME, Disp: 30 tablet, Rfl: 11 .  TRI-LO-MARZIA 0.18/0.215/0.25 MG-25 MCG tab, TAKE 1 TABLET BY MOUTH DAILY., Disp: 28 tablet, Rfl: 5 .  valACYclovir (VALTREX) 500 MG tablet, Take 1 tablet (500 mg total) by mouth 2 (two) times daily., Disp: 60 tablet, Rfl: 2 .  acetaminophen (TYLENOL) 325 MG tablet, Take 2 tablets (650 mg total) by mouth every 4 (four) hours as needed for mild pain (or temp > 37.5 C (99.5 F)). (Patient not taking: Reported on 05/17/2017), Disp: , Rfl:  .  amLODipine (NORVASC) 5 MG tablet, Take 1 tablet (5 mg total) daily by mouth. (Patient not taking: Reported on 05/17/2017), Disp: 90 tablet, Rfl: 0 .  lisinopril (PRINIVIL,ZESTRIL) 20 MG tablet, TAKE 1 TABLET BY MOUTH EVERY DAY, Disp: 90 tablet, Rfl: 1 .  pregabalin (LYRICA) 200 MG capsule, Take 1 capsule (200 mg total) by mouth 2 (two) times daily., Disp: 60 capsule, Rfl: 5 .  TRI-LO-MARZIA 0.18/0.215/0.25 MG-25 MCG tab, TAKE 1 TABLET BY MOUTH DAILY., Disp: 28 tablet, Rfl: 5  PAST MEDICAL HISTORY: Past Medical History:  Diagnosis  Date  . Anxiety   . Depression   . Hypertension   . MS (multiple sclerosis) ([place])     PAST SURGICAL HISTORY: Past Surgical History:  Procedure Laterality Date  . BILATERAL HIP ARTHROSCOPY Left 07/2013  . JOINT REPLACEMENT Bilateral     R 2004 and L 2005  FAMILY HISTORY: Family History  Problem Relation Age of Onset  . Healthy Mother   . Healthy Father   . Breast cancer Unknown        paternal grandmother dx age 72  . Colon cancer Unknown        paternal grandmother  . Hypertension Other   . Diabetes Other     SOCIAL HISTORY:  Social History   Socioeconomic History  . Marital status: Single    Spouse name: Not on file  . Number of children: Not on file  . Years of education: Not on file  . Highest education level: Not on file  Social Needs  . Financial resource strain: Not on file  . Food insecurity - worry: Not on file  . Food insecurity - inability: Not on file  . Transportation needs - medical: Not on file  . Transportation needs - non-medical: Not on file  Occupational History  . Not on file  Tobacco Use  . Smoking status: Never Smoker  . Smokeless tobacco: Never Used  Substance and Sexual Activity  . Alcohol use: No  . Drug use: No  . Sexual activity: Not Currently    Birth control/protection: Pill  Other Topics Concern  . Not on file  Social History Narrative  . Not on file     PHYSICAL EXAM  Vitals:   05/17/17 1136  BP: 134/82  Pulse: 81  Resp: 18  Weight: 193 lb (87.5 kg)  Height: 5\' 5"  (1.651 m)    Body mass index is 32.12 kg/m.   General: The patient is well-developed and well-nourished and in no acute distress.  Musculoskeletal:   She has good neck ROM.     Neurologic Exam  Mental status: The patient is alert and oriented x 3 at the time of the examination. The patient has apparent normal recent and remote memory, with mildly reduced attention span and concentration ability.   Speech is normal.  Cranial nerves: Extraocular  movements are full.  Facial strength and sensation is normal. Trapezius strength is normal.  The tongue is midline, and the patient has symmetric elevation of the soft palate. No obvious hearing deficits are noted.  Motor:  Muscle bulk is normal.   Muscle tone is increased in the left leg.. Strength is  5 / 5 in all 4 extremities.   Sensory: Touch and vibration sensation is normal in the arms or legs.  Coordination: Cerebellar testing reveals good finger-nose-finger .   She has mildly reduced right heel-to-shin and severely reduced left heel-to-shin.   Gait and station: Station is near normal.   Her gait has a wide stance. There is a left  Foot drop. There is spasticity in the left while she walks. She is unable to tandem walk  Reflexes: Deep tendon reflexes are increased in the legs, left greater than right. Deep and reflexes are increased at the knees, left greater than right. There is spread bilaterally. She has nonsustained clonus at the ankles, left greater than right.     DIAGNOSTIC DATA (LABS, IMAGING, TESTING) - I reviewed patient records, labs, notes, testing and imaging myself where available.  Lab Results  Component Value Date   WBC 7.9 04/24/2017   HGB 12.3 04/24/2017   HCT 37.0 04/24/2017   MCV 95.6 04/24/2017   PLT 243 04/24/2017      Component Value Date/Time   NA 140 05/10/2017 1052   K 4.1 05/10/2017 1052   CL 100 05/10/2017 1052   CO2  24 05/10/2017 1052   GLUCOSE 86 05/10/2017 1052   GLUCOSE 106 (H) 04/24/2017 0530   BUN 20 05/10/2017 1052   CREATININE 1.52 (H) 05/10/2017 1052   CREATININE 0.85 07/03/2014 0959   CALCIUM 9.8 05/10/2017 1052   PROT 7.4 04/20/2017 0047   PROT 7.2 06/03/2015 1635   ALBUMIN 3.9 04/20/2017 0047   ALBUMIN 4.4 06/03/2015 1635   AST 14 (L) 04/20/2017 0047   ALT 12 (L) 04/20/2017 0047   ALKPHOS 95 04/20/2017 0047   BILITOT 0.3 04/20/2017 0047   BILITOT 0.3 06/03/2015 1635   GFRNONAA 43 (L) 05/10/2017 1052   GFRNONAA 88  07/03/2014 0959   GFRAA 50 (L) 05/10/2017 1052   GFRAA >89 07/03/2014 0959   Lab Results  Component Value Date   CHOL 228 (H) 04/20/2017   HDL 77 04/20/2017   LDLCALC 142 (H) 04/20/2017   TRIG 46 04/20/2017   CHOLHDL 3.0 04/20/2017   Lab Results  Component Value Date   HGBA1C 5.3 04/20/2017   No results found for: VITAMINB12 Lab Results  Component Value Date   TSH 1.970 04/19/2016      ASSESSMENT AND PLAN  Multiple sclerosis ([place]) - Plan: Basic metabolic panel, Glomerular Membrane Antibodies  Elevated serum creatinine - Plan: Basic metabolic panel, Glomerular Membrane Antibodies  High risk medication use - Plan: Basic metabolic panel, Glomerular Membrane Antibodies  Left leg weakness  Other fatigue  Gait disturbance   1.    She is concerned about her left leg worsening. There is not much change on the current exam compared to last year but she does have a significant foot drop and it is likely related to slow progression of disability from her MS. 2.    She has increasing creatinine. We need to be concerned as anti-glomerular basement membrane antibodies can be seen with Lemtrada therapy.  I will recheck today and also check anti-glomerular antibodies.   Also of note, she appears to have hydronephrosis on the left seen on a lumbar MRI which might also be playing a role. 3.    Continue medications for anxiety, pain and spasticity 4.    Return to see me in 6 months or sooner if there are new or worsening neurologic symptoms.  Richard A. Felecia Shelling, MD, PhD 3/53/2992, 4:26 PM Certified in Neurology, Clinical Neurophysiology, Sleep Medicine, Pain Medicine and Neuroimaging  [facility] [address], [place], [place] [address] [phone] ]

## 2017-05-18 ENCOUNTER — Telehealth: Payer: Self-pay | Admitting: *Deleted

## 2017-05-18 LAB — BASIC METABOLIC PANEL
BUN / CREAT RATIO: 16 (ref 9–23)
BUN: 28 mg/dL — ABNORMAL HIGH (ref 6–20)
CHLORIDE: 99 mmol/L (ref 96–106)
CO2: 23 mmol/L (ref 20–29)
CREATININE: 1.7 mg/dL — AB (ref 0.57–1.00)
Calcium: 9.6 mg/dL (ref 8.7–10.2)
GFR calc Af Amer: 43 mL/min/{1.73_m2} — ABNORMAL LOW (ref >59)
GFR calc non Af Amer: 38 mL/min/{1.73_m2} — ABNORMAL LOW (ref >59)
GLUCOSE: 104 mg/dL — AB (ref 65–99)
Potassium: 4.1 mmol/L (ref 3.5–5.2)
SODIUM: 142 mmol/L (ref 134–144)

## 2017-05-18 LAB — GLOMERULAR BASEMENT MEMBRANE ANTIBODIES: ANTIGLOMERULAR BM AB, QN: 3 U (ref 0–20)

## 2017-05-18 NOTE — Telephone Encounter (Signed)
Pt called she is not any better today. She is wanting to come in for an infusion, pt did mention going to the ED. Please call to advise at [phone]

## 2017-05-18 NOTE — Telephone Encounter (Signed)
-----   Message from Britt Bottom, MD sent at 05/18/2017 12:01 PM EDT ----- Please let her know that the kidney blood tests looked a little bit worse. Can you find out from her when she has an appointment to see nephrology.   I can call them to see if we can move it up if not in the next couple days

## 2017-05-18 NOTE — Telephone Encounter (Signed)
Nephrology appt. (Dr. Hollie Salk, [facility]) is 05/26/17.  RAS would like pt. seen sooner.  I spoke with [facility] and they requested we page Dr. Hollie Salk to discuss a sooner appt.  Pager# [phone]/fim

## 2017-05-18 NOTE — Telephone Encounter (Signed)
LMOM (identified vm) that since she just had steroids in the hospital 2/14-2/16, and since progressive gait disturbance is likely due to the course of her MS, not an acute exacerbation, he does not feel the benefit of continued high dose steroids outweighs the risk.  He prefers she not have additional steroids at this time.  Please call back if she has other questions/fim

## 2017-05-19 ENCOUNTER — Other Ambulatory Visit (HOSPITAL_COMMUNITY): Payer: Medicare Other

## 2017-05-19 ENCOUNTER — Observation Stay (HOSPITAL_COMMUNITY): Payer: Medicare Other

## 2017-05-19 ENCOUNTER — Other Ambulatory Visit: Payer: Self-pay

## 2017-05-19 ENCOUNTER — Observation Stay (HOSPITAL_COMMUNITY)
Admission: EM | Admit: 2017-05-19 | Discharge: 2017-05-23 | Disposition: A | Discharge status: Skilled Nursing Facility | Payer: Medicare Other | Attending: Family Medicine | Admitting: Family Medicine

## 2017-05-19 ENCOUNTER — Encounter (HOSPITAL_COMMUNITY): Payer: Self-pay | Admitting: Emergency Medicine

## 2017-05-19 DIAGNOSIS — R739 Hyperglycemia, unspecified: Secondary | ICD-10-CM | POA: Insufficient documentation

## 2017-05-19 DIAGNOSIS — T380X5A Adverse effect of glucocorticoids and synthetic analogues, initial encounter: Secondary | ICD-10-CM | POA: Insufficient documentation

## 2017-05-19 DIAGNOSIS — N1330 Unspecified hydronephrosis: Secondary | ICD-10-CM | POA: Insufficient documentation

## 2017-05-19 DIAGNOSIS — G35 Multiple sclerosis: Secondary | ICD-10-CM | POA: Diagnosis present

## 2017-05-19 DIAGNOSIS — R262 Difficulty in walking, not elsewhere classified: Secondary | ICD-10-CM | POA: Diagnosis not present

## 2017-05-19 DIAGNOSIS — N319 Neuromuscular dysfunction of bladder, unspecified: Secondary | ICD-10-CM | POA: Insufficient documentation

## 2017-05-19 DIAGNOSIS — Z9181 History of falling: Secondary | ICD-10-CM | POA: Diagnosis not present

## 2017-05-19 DIAGNOSIS — M25569 Pain in unspecified knee: Secondary | ICD-10-CM

## 2017-05-19 DIAGNOSIS — Z79891 Long term (current) use of opiate analgesic: Secondary | ICD-10-CM | POA: Diagnosis not present

## 2017-05-19 DIAGNOSIS — F419 Anxiety disorder, unspecified: Secondary | ICD-10-CM | POA: Insufficient documentation

## 2017-05-19 DIAGNOSIS — F329 Major depressive disorder, single episode, unspecified: Secondary | ICD-10-CM | POA: Insufficient documentation

## 2017-05-19 DIAGNOSIS — M21372 Foot drop, left foot: Secondary | ICD-10-CM | POA: Insufficient documentation

## 2017-05-19 DIAGNOSIS — M6281 Muscle weakness (generalized): Secondary | ICD-10-CM | POA: Insufficient documentation

## 2017-05-19 DIAGNOSIS — Z79899 Other long term (current) drug therapy: Secondary | ICD-10-CM | POA: Insufficient documentation

## 2017-05-19 DIAGNOSIS — R29898 Other symptoms and signs involving the musculoskeletal system: Secondary | ICD-10-CM

## 2017-05-19 DIAGNOSIS — Z96643 Presence of artificial hip joint, bilateral: Secondary | ICD-10-CM | POA: Diagnosis not present

## 2017-05-19 DIAGNOSIS — E876 Hypokalemia: Secondary | ICD-10-CM | POA: Insufficient documentation

## 2017-05-19 DIAGNOSIS — R7989 Other specified abnormal findings of blood chemistry: Secondary | ICD-10-CM | POA: Diagnosis present

## 2017-05-19 DIAGNOSIS — N183 Chronic kidney disease, stage 3 (moderate): Secondary | ICD-10-CM | POA: Insufficient documentation

## 2017-05-19 DIAGNOSIS — I10 Essential (primary) hypertension: Secondary | ICD-10-CM | POA: Diagnosis present

## 2017-05-19 DIAGNOSIS — I129 Hypertensive chronic kidney disease with stage 1 through stage 4 chronic kidney disease, or unspecified chronic kidney disease: Secondary | ICD-10-CM | POA: Insufficient documentation

## 2017-05-19 DIAGNOSIS — N289 Disorder of kidney and ureter, unspecified: Secondary | ICD-10-CM

## 2017-05-19 DIAGNOSIS — N179 Acute kidney failure, unspecified: Secondary | ICD-10-CM | POA: Diagnosis not present

## 2017-05-19 LAB — URINALYSIS, ROUTINE W REFLEX MICROSCOPIC
Bilirubin Urine: NEGATIVE
Glucose, UA: NEGATIVE mg/dL
Hgb urine dipstick: NEGATIVE
KETONES UR: NEGATIVE mg/dL
LEUKOCYTES UA: NEGATIVE
NITRITE: NEGATIVE
PH: 6 (ref 5.0–8.0)
Protein, ur: NEGATIVE mg/dL
Specific Gravity, Urine: 1.02 (ref 1.005–1.030)

## 2017-05-19 LAB — GLUCOSE, CAPILLARY
GLUCOSE-CAPILLARY: 192 mg/dL — AB (ref 65–99)
GLUCOSE-CAPILLARY: 147 mg/dL — AB (ref 65–99)
Glucose-Capillary: 118 mg/dL — ABNORMAL HIGH (ref 65–99)
Glucose-Capillary: 180 mg/dL — ABNORMAL HIGH (ref 65–99)

## 2017-05-19 LAB — CBC WITH DIFFERENTIAL/PLATELET
BASOS PCT: 0 %
Basophils Absolute: 0 10*3/uL (ref 0.0–0.1)
EOS ABS: 0 10*3/uL (ref 0.0–0.7)
EOS PCT: 0 %
HCT: 39 % (ref 36.0–46.0)
Hemoglobin: 13.5 g/dL (ref 12.0–15.0)
Lymphocytes Relative: 12 %
Lymphs Abs: 0.8 10*3/uL (ref 0.7–4.0)
MCH: 33.2 pg (ref 26.0–34.0)
MCHC: 34.6 g/dL (ref 30.0–36.0)
MCV: 95.8 fL (ref 78.0–100.0)
MONOS PCT: 7 %
Monocytes Absolute: 0.5 10*3/uL (ref 0.1–1.0)
Neutro Abs: 5.4 10*3/uL (ref 1.7–7.7)
Neutrophils Relative %: 81 %
Platelets: 301 10*3/uL (ref 150–400)
RBC: 4.07 MIL/uL (ref 3.87–5.11)
RDW: 14.9 % (ref 11.5–15.5)
WBC: 6.8 10*3/uL (ref 4.0–10.5)

## 2017-05-19 LAB — COMPREHENSIVE METABOLIC PANEL
ALBUMIN: 4.1 g/dL (ref 3.5–5.0)
ALT: 20 U/L (ref 14–54)
ANION GAP: 12 (ref 5–15)
AST: 20 U/L (ref 15–41)
Alkaline Phosphatase: 83 U/L (ref 38–126)
BUN: 30 mg/dL — ABNORMAL HIGH (ref 6–20)
CO2: 27 mmol/L (ref 22–32)
Calcium: 9.5 mg/dL (ref 8.9–10.3)
Chloride: 102 mmol/L (ref 101–111)
Creatinine, Ser: 1.6 mg/dL — ABNORMAL HIGH (ref 0.44–1.00)
GFR calc Af Amer: 46 mL/min — ABNORMAL LOW (ref >60)
GFR calc non Af Amer: 40 mL/min — ABNORMAL LOW (ref >60)
GLUCOSE: 97 mg/dL (ref 65–99)
POTASSIUM: 3.4 mmol/L — AB (ref 3.5–5.1)
SODIUM: 141 mmol/L (ref 135–145)
TOTAL PROTEIN: 7.6 g/dL (ref 6.5–8.1)
Total Bilirubin: 0.5 mg/dL (ref 0.3–1.2)

## 2017-05-19 LAB — CREATININE, URINE, RANDOM: CREATININE, URINE: 149.08 mg/dL

## 2017-05-19 LAB — POC URINE PREG, ED: Preg Test, Ur: NEGATIVE

## 2017-05-19 MED ORDER — PANTOPRAZOLE SODIUM 40 MG PO TBEC
40.0000 mg | DELAYED_RELEASE_TABLET | Freq: Every day | ORAL | Status: DC
  Administered 2017-05-19 – 2017-05-23 (×5): 40 mg via ORAL
  Filled 2017-05-19 (×5): qty 1

## 2017-05-19 MED ORDER — HYDRALAZINE HCL 20 MG/ML IJ SOLN: 5.0000 mg | INTRAMUSCULAR | Status: DC | PRN

## 2017-05-19 MED ORDER — AMITRIPTYLINE HCL 25 MG PO TABS
50.0000 mg | ORAL_TABLET | Freq: Every day | ORAL | Status: DC
Start: 2017-05-19 — End: 2017-05-23
  Administered 2017-05-19 – 2017-05-22 (×4): 50 mg via ORAL
  Filled 2017-05-19 (×4): qty 2

## 2017-05-19 MED ORDER — INSULIN ASPART 100 UNIT/ML ~~LOC~~ SOLN
0.0000 [IU] | Freq: Three times a day (TID) | SUBCUTANEOUS | Status: DC
  Administered 2017-05-19: 2 [IU] via SUBCUTANEOUS
  Administered 2017-05-20: 1 [IU] via SUBCUTANEOUS
  Administered 2017-05-20: 2 [IU] via SUBCUTANEOUS
  Administered 2017-05-20 – 2017-05-21 (×3): 3 [IU] via SUBCUTANEOUS
  Administered 2017-05-22: 2 [IU] via SUBCUTANEOUS

## 2017-05-19 MED ORDER — LAMOTRIGINE 100 MG PO TABS
200.0000 mg | ORAL_TABLET | Freq: Two times a day (BID) | ORAL | Status: DC
  Administered 2017-05-19 – 2017-05-23 (×9): 200 mg via ORAL
  Filled 2017-05-19 (×9): qty 2

## 2017-05-19 MED ORDER — PREGABALIN 50 MG PO CAPS
200.0000 mg | ORAL_CAPSULE | Freq: Two times a day (BID) | ORAL | Status: DC
  Administered 2017-05-19 – 2017-05-23 (×9): 200 mg via ORAL
  Filled 2017-05-19 (×9): qty 4

## 2017-05-19 MED ORDER — METOPROLOL TARTRATE 12.5 MG HALF TABLET
12.5000 mg | ORAL_TABLET | Freq: Two times a day (BID) | ORAL | Status: DC
  Administered 2017-05-19 – 2017-05-23 (×9): 12.5 mg via ORAL
  Filled 2017-05-19 (×9): qty 1

## 2017-05-19 MED ORDER — DIAZEPAM 2 MG PO TABS
2.0000 mg | ORAL_TABLET | Freq: Once | ORAL | Status: AC
  Administered 2017-05-19: 2 mg via ORAL
  Filled 2017-05-19: qty 1

## 2017-05-19 MED ORDER — GADOBENATE DIMEGLUMINE 529 MG/ML IV SOLN
20.0000 mL | Freq: Once | INTRAVENOUS | Status: AC | PRN
  Administered 2017-05-19: 18 mL via INTRAVENOUS

## 2017-05-19 MED ORDER — BUSPIRONE HCL 5 MG PO TABS
15.0000 mg | ORAL_TABLET | Freq: Two times a day (BID) | ORAL | Status: DC
  Administered 2017-05-19 – 2017-05-23 (×9): 15 mg via ORAL
  Filled 2017-05-19 (×10): qty 1

## 2017-05-19 MED ORDER — SODIUM CHLORIDE 0.9 % IV SOLN
1000.0000 mg | INTRAVENOUS | Status: AC
  Administered 2017-05-20 – 2017-05-21 (×2): 1000 mg via INTRAVENOUS
  Filled 2017-05-19 (×2): qty 8

## 2017-05-19 MED ORDER — AMPHETAMINE-DEXTROAMPHET ER 20 MG PO CP24
20.0000 mg | ORAL_CAPSULE | Freq: Every day | ORAL | Status: DC
Start: 2017-05-19 — End: 2017-05-23
  Administered 2017-05-19 – 2017-05-23 (×5): 20 mg via ORAL
  Filled 2017-05-19 (×5): qty 1

## 2017-05-19 MED ORDER — SODIUM CHLORIDE 0.9 % IV SOLN
1000.0000 mg | Freq: Once | INTRAVENOUS | Status: AC
  Administered 2017-05-19: 1000 mg via INTRAVENOUS
  Filled 2017-05-19: qty 8

## 2017-05-19 MED ORDER — POTASSIUM CHLORIDE CRYS ER 20 MEQ PO TBCR
40.0000 meq | EXTENDED_RELEASE_TABLET | Freq: Once | ORAL | Status: AC
  Administered 2017-05-19: 40 meq via ORAL
  Filled 2017-05-19: qty 2

## 2017-05-19 MED ORDER — CITALOPRAM HYDROBROMIDE 20 MG PO TABS
40.0000 mg | ORAL_TABLET | Freq: Every day | ORAL | Status: DC
  Administered 2017-05-19 – 2017-05-23 (×5): 40 mg via ORAL
  Filled 2017-05-19 (×5): qty 2

## 2017-05-19 MED ORDER — BACLOFEN 20 MG PO TABS
20.0000 mg | ORAL_TABLET | Freq: Three times a day (TID) | ORAL | Status: DC | PRN
  Administered 2017-05-20 – 2017-05-21 (×3): 20 mg via ORAL
  Filled 2017-05-19 (×3): qty 1

## 2017-05-19 MED ORDER — AMLODIPINE BESYLATE 5 MG PO TABS
5.0000 mg | ORAL_TABLET | Freq: Every day | ORAL | Status: DC
  Administered 2017-05-19 – 2017-05-23 (×5): 5 mg via ORAL
  Filled 2017-05-19 (×5): qty 1

## 2017-05-19 MED ORDER — GABAPENTIN 400 MG PO CAPS: 800.0000 mg | ORAL_CAPSULE | Freq: Three times a day (TID) | ORAL | Status: DC

## 2017-05-19 MED ORDER — ENOXAPARIN SODIUM 30 MG/0.3ML ~~LOC~~ SOLN: 30.0000 mg | SUBCUTANEOUS | Status: DC

## 2017-05-19 MED ORDER — ENOXAPARIN SODIUM 40 MG/0.4ML ~~LOC~~ SOLN
40.0000 mg | SUBCUTANEOUS | Status: DC
  Administered 2017-05-19 – 2017-05-23 (×5): 40 mg via SUBCUTANEOUS
  Filled 2017-05-19 (×5): qty 0.4

## 2017-05-19 MED ORDER — OXYCODONE-ACETAMINOPHEN 5-325 MG PO TABS
1.0000 | ORAL_TABLET | Freq: Three times a day (TID) | ORAL | Status: DC | PRN
  Administered 2017-05-19 – 2017-05-23 (×8): 1 via ORAL
  Filled 2017-05-19 (×8): qty 1

## 2017-05-19 NOTE — Telephone Encounter (Signed)
Returned call to patient and left detailed message that Dr. Felecia Shelling had ordered labs to evaluate her kidney function.  In response to the labs being abnormal, he referred her to nephrology for further workup and treatment.  He is hopeful she will be seen in the hospital now instead of their office.  I provided our number to call back with any other questions.

## 2017-05-19 NOTE — ED Provider Notes (Signed)
[facility] DEPT Provider Note   CSN: [id number] Arrival date & time: 05/19/17  0050     History   Chief Complaint Chief Complaint  Patient presents with  . MS Flare  . Unable to walk    HPI Dawn Peters is a 37 y.o. female.  HPI   37 year old female with history of multiple sclerosis, anxiety, depression, hypertension brought here via EMS presenting with complaint MS flare.  Patient report for 1 day she has had achy throbbing pain to her left leg from the knee down as well as weakness to the leg unable to walk on it.  Symptom felt similar to prior MS flare.  No associated fever, chills, headache, vision changes, chest pain, trouble breathing, abdominal pain, back pain, bowel bladder incontinence or saddle anesthesia.  She mentioned having another flare 3 weeks ago requiring ICU stay.  She did only receive IV steroids which helped her symptoms in the past.  Her neurologist is Dr. Felecia Shelling.  She denies any recent sickness or provocative factor causing her symptoms.  Past Medical History:  Diagnosis Date  . Anxiety   . Depression   . Hypertension   . MS (multiple sclerosis) [facility])     Patient Active Problem List   Diagnosis Date Noted  . Elevated serum creatinine 05/12/2017  . Acute encephalopathy 04/20/2017  . Acute kidney injury superimposed on CKD ([place]) 04/20/2017  . Hydronephrosis of left kidney 10/24/2016  . Attention deficit 10/24/2016  . Left leg weakness 08/13/2016  . Chronic pain 08/13/2016  . Mild renal insufficiency 08/13/2016  . Left-sided weakness   . High risk medication use 09/15/2015  . Hip pain, bilateral 07/28/2015  . Urinary hesitancy 07/28/2015  . Multiple sclerosis exacerbation ([place]) 07/17/2015  . Urinary disorder 06/11/2015  . Gait disturbance 05/19/2015  . Numbness 05/19/2015  . Depression with anxiety 05/13/2015  . Other fatigue 05/13/2015  . Left knee pain 05/13/2015  . Avascular necrosis of bones of both hips  ([place]) 05/13/2015  . Screening for HIV (human immunodeficiency virus) 07/08/2014  . Essential hypertension, benign 11/19/2013  . Anxiety state, unspecified 11/19/2013  . Screen for STD (sexually transmitted disease) 11/01/2013  . Encounter for routine gynecological examination 11/01/2013  . Oral contraceptive use 10/20/2011  . Multiple sclerosis ([place]) 08/08/2007  . RASH AND OTHER NONSPECIFIC SKIN ERUPTION 06/22/2007  . AVASCULAR NECROSIS, FEMORAL HEAD 03/11/2006  . Essential hypertension 02/03/2006  . 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 DVT, HX OF 02/03/2006    Past Surgical History:  Procedure Laterality Date  . BILATERAL HIP ARTHROSCOPY Left 07/2013  . JOINT REPLACEMENT Bilateral     R 2004 and L 2005    OB History    Gravida Para Term Preterm AB Living   1 1 1     1    SAB TAB Ectopic Multiple Live Births           1       Home Medications    Prior to Admission medications   Medication Sig Start Date End Date Taking? Authorizing Provider  acetaminophen (TYLENOL) 325 MG tablet Take 2 tablets (650 mg total) by mouth every 4 (four) hours as needed for mild pain (or temp > 37.5 C (99.5 F)). Patient not taking: Reported on 05/17/2017 04/24/17   Theodis Blaze, MD  amitriptyline (ELAVIL) 25 MG tablet TAKE ONE OR TWO AT BEDTIME Patient taking differently: take 25-50mg  by mouth at bedtime 11/09/16   Sater, Nanine Means, MD  amLODipine (NORVASC) 5  MG tablet Take 1 tablet (5 mg total) daily by mouth. Patient not taking: Reported on 05/17/2017 01/10/17   Daleen Bo, MD  amphetamine-dextroamphetamine (ADDERALL XR) 20 MG 24 hr capsule Take 1 capsule (20 mg total) by mouth daily. 04/19/17   Sater, Nanine Means, MD  baclofen (LIORESAL) 20 MG tablet Take 1 tablet (20 mg total) by mouth 3 (three) times daily as needed (for ms). Patient taking differently: Take 20 mg by mouth 4 (four) times daily.  05/04/16   Sater, Nanine Means, MD  busPIRone (BUSPAR) 15 MG tablet Take 1 tablet (15 mg total) by mouth 2  (two) times daily. 05/04/16   Sater, Nanine Means, MD  citalopram (CELEXA) 40 MG tablet Take 1 tablet (40 mg total) by mouth daily. 05/04/16   Sater, Nanine Means, MD  dalfampridine 10 MG TB12 Take 1 tablet (10 mg total) by mouth 2 (two) times daily. 08/08/16   Sater, Nanine Means, MD  gabapentin (NEURONTIN) 800 MG tablet Take 1 tablet (800 mg total) by mouth 3 (three) times daily. 09/06/16   Sater, Nanine Means, MD  lamoTRIgine (LAMICTAL) 200 MG tablet Take 1 tablet (200 mg total) by mouth 2 (two) times daily. 08/05/16   Sater, Nanine Means, MD  lisinopril (PRINIVIL,ZESTRIL) 20 MG tablet TAKE 1 TABLET BY MOUTH EVERY DAY 10/18/16   Sater, Nanine Means, MD  metoprolol tartrate (LOPRESSOR) 25 MG tablet Take 0.5 tablets (12.5 mg total) by mouth 2 (two) times daily. 04/24/17   Theodis Blaze, MD  naproxen (NAPROSYN) 375 MG tablet Take 1 tablet (375 mg total) by mouth 2 (two) times daily. Patient taking differently: Take 375 mg by mouth 2 (two) times daily as needed for mild pain.  07/15/16   Janne Napoleon, NP  Oxcarbazepine (TRILEPTAL) 300 MG tablet Take 1 tablet (300 mg total) by mouth 2 (two) times daily. 11/11/15   Sater, Nanine Means, MD  oxyCODONE-acetaminophen (PERCOCET/ROXICET) 5-325 MG tablet Take 1 tablet by mouth 4 (four) times daily. 04/23/17   Theodis Blaze, MD  pregabalin (LYRICA) 200 MG capsule Take 1 capsule (200 mg total) by mouth 2 (two) times daily. 05/17/17   Sater, Nanine Means, MD  SUMAtriptan (IMITREX) 100 MG tablet Take 1 tablet (100 mg total) by mouth once as needed for migraine. May repeat in 2 hours if headache persists or recurs. 12/28/16   Sater, Nanine Means, MD  tamsulosin (FLOMAX) 0.4 MG CAPS capsule Take 0.4 mg by mouth daily.    [provider]  tiZANidine (ZANAFLEX) 4 MG tablet TAKE 1 TABLET BY MOUTH AT BEDTIME 10/18/16   Sater, Nanine Means, MD  TRI-LO-MARZIA 0.18/0.215/0.25 MG-25 MCG tab TAKE 1 TABLET BY MOUTH DAILY. 02/05/16   Woodroe Mode, MD  TRI-LO-MARZIA 0.18/0.215/0.25 MG-25 MCG tab TAKE 1 TABLET  BY MOUTH DAILY. 11/11/16   Woodroe Mode, MD  valACYclovir (VALTREX) 500 MG tablet Take 1 tablet (500 mg total) by mouth 2 (two) times daily. 03/20/17   Sater, Nanine Means, MD    Family History Family History  Problem Relation Age of Onset  . Healthy Mother   . Healthy Father   . Breast cancer Unknown        paternal grandmother dx age 20  . Colon cancer Unknown        paternal grandmother  . Hypertension Other   . Diabetes Other     Social History Social History   Tobacco Use  . Smoking status: Never Smoker  . Smokeless tobacco: Never Used  Substance  Use Topics  . Alcohol use: No  . Drug use: No     Allergies   Ace inhibitors and Lisinopril   Review of Systems Review of Systems  All other systems reviewed and are negative.    Physical Exam Updated Vital Signs BP 135/86   Pulse 94   Temp 98.3 F (36.8 C) (Oral)   Resp (!) 21   Ht 5\' 5"  (1.651 m)   Wt 86.2 kg (190 lb)   LMP 05/15/2017 (Exact Date)   SpO2 100%   BMI 31.62 kg/m   Physical Exam  Constitutional: She appears well-developed and well-nourished. No distress.  HENT:  Head: Atraumatic.  Eyes: Conjunctivae are normal.  Neck: Neck supple.  Cardiovascular: Normal rate and regular rhythm.  Pulmonary/Chest: Effort normal and breath sounds normal.  Abdominal: Soft. There is no tenderness.  Musculoskeletal: She exhibits tenderness (Mild diffuse tenderness to left lower extremity from the knee down with smaller bruises noted.  Compartment is soft.  Unable to raise leg above the bed.).  Neurological: She is alert.  Skin: No rash noted.  Psychiatric: She has a normal mood and affect.  Nursing note and vitals reviewed.    ED Treatments / Results  Labs (all labs ordered are listed, but only abnormal results are displayed) Labs Reviewed  COMPREHENSIVE METABOLIC PANEL - Abnormal; Notable for the following components:      Result Value   Potassium 3.4 (*)    BUN 30 (*)    Creatinine, Ser 1.60 (*)     GFR calc non Af Amer 40 (*)    GFR calc Af Amer 46 (*)    All other components within normal limits  CBC WITH DIFFERENTIAL/PLATELET  URINALYSIS, ROUTINE W REFLEX MICROSCOPIC  POC URINE PREG, ED    EKG  EKG Interpretation None       Radiology No results found.  Procedures Procedures (including critical care time)  Medications Ordered in ED Medications  methylPREDNISolone sodium succinate (SOLU-MEDROL) 1,000 mg in sodium chloride 0.9 % 50 mL IVPB (not administered)     Initial Impression / Assessment and Plan / ED Course  I have reviewed the triage vital signs and the nursing notes.  Pertinent labs & imaging results that were available during my care of the patient were reviewed by me and considered in my medical decision making (see chart for details).     BP 135/86   Pulse 94   Temp 98.3 F (36.8 C) (Oral)   Resp (!) 21   Ht 5\' 5"  (1.651 m)   Wt 86.2 kg (190 lb)   LMP 05/15/2017 (Exact Date)   SpO2 100%   BMI 31.62 kg/m    Final Clinical Impressions(s) / ED Diagnoses   Final diagnoses:  MS (multiple sclerosis) ([place])  Weakness of left lower extremity    ED Discharge Orders    None     5:07 AM Patient here with isolated left lower extremity pain and numbness similar to her prior MS flare.  No headache or vision changes.  Labs are reassuring.  Appreciate consultation from on-call neurologist, Dr. Lorraine Lax who recommend nonemergent MRI tomorrow morning.  He also recommends starting patient on Solu-Medrol 1 g IV for the next 3 days.  Since patient is unable to ambulate, will consult for admission.  5:15 AM Appreciate consultation from [facility] hospitalist, Dr. Hal Hope who agrees to admit patient for further management.   Domenic Moras, PA-C 05/19/17 [place], Delice Bison, DO 05/19/17 [phone]

## 2017-05-19 NOTE — Progress Notes (Signed)
   05/19/17 1444  What Happened  Was fall witnessed? No  Was patient injured? No  Patient found on floor;in bathroom  Found by Staff-comment (student was outside [place] door waiting on patient to finish)  Stated prior activity other (comment) (pt was assisted to bathroom and student was outside bathroom)  Follow Up  MD notified yes via text page  Time MD notified 14  Family notified No- patient refusal  Additional tests No  Progress note created (see row info) Yes  Adult Fall Risk Assessment  Risk Factor Category (scoring not indicated) High fall risk per protocol (document High fall risk)  Patient's Fall Risk High Fall Risk (>13 points)  Adult Fall Risk Interventions  Required Bundle Interventions *See Row Information* High fall risk - low, moderate, and high requirements implemented  Additional Interventions Other (Comment)  Vitals  Temp 98.4 F (36.9 C)  Temp Source Oral  BP (!) 147/58  MAP (mmHg) 109  BP Method Automatic  Patient Position (if appropriate) Sitting  Pulse Rate (!) 109  Pulse Rate Source Dinamap  Resp 18  Oxygen Therapy  SpO2 98 %  O2 Device Room Air  Pain Assessment  Pain Assessment 0-10  Pain Score 8  Neurological  Neuro (WDL) WDL  Musculoskeletal  Musculoskeletal (WDL) X  Assistive Device Front wheel walker  Generalized Weakness Yes  Integumentary  Integumentary (WDL) WDL   Bed alarm, middle alarm, was alarming d/t patient attempting to get up without assistance.  Nursing student went into room to assist patient. Patient informed student that she does not need her bed alarm on.  Student assisted patient to bathroom with walker.  Patient was instructed by the student not to get up by herself that she would be outside the door waiting for her to finish.  Patient stated she decided on her on, knowing that she was not to get up, to stand and pour her urine into the urine cup for sample.  Patient lost balance and landed on her bottom. Patient denies  hurting herself or hitting her head.

## 2017-05-19 NOTE — Consult Note (Addendum)
Neurology Consultation Reason for Consult: MS flare, Left leg weakness Referring Physician:   History is obtained from:Patient and chart review  HPI: Dawn Peters is a 37 y.o. female with history of Multiple sclerosis with baseline left leg weakness, HTN, depression who presents with worsening of her left leg weakness that began on Thursday morning. She was recently admitted on 04/20/17 for AMS and worsening leg weakness and received 3 days of IV steriods. MRI brain performed then showed no new lesions.   She was previously on Tecf[employer]a, switched to [place] and completed her third course on Oct 24. She saw Dr. Felecia Shelling last week, her creatinine has been increasing and Anti-glomerular antibodies have been ordered.    ROS: A 14 point ROS was performed and is negative except as noted in the HPI.   Past Medical History:  Diagnosis Date  . Anxiety   . Depression   . Hypertension   . MS (multiple sclerosis) ([place])      Family History  Problem Relation Age of Onset  . Healthy Mother   . Healthy Father   . Breast cancer Unknown        paternal grandmother dx age 62  . Colon cancer Unknown        paternal grandmother  . Hypertension Other   . Diabetes Other      Social History:  reports that  has never smoked. she has never used smokeless tobacco. She reports that she does not drink alcohol or use drugs.   Exam: Current vital signs: BP (!) 142/101   Pulse 91   Temp 98.3 F (36.8 C) (Oral)   Resp 18   Ht 5\' 5"  (1.651 m)   Wt 86.2 kg (190 lb)   LMP 05/15/2017 (Exact Date)   SpO2 94%   BMI 31.62 kg/m  Vital signs in last 24 hours: Temp:  [98.3 F (36.8 C)] 98.3 F (36.8 C) (03/15 0059) Pulse Rate:  [91-94] 91 (03/15 0445) Resp:  [18-21] 18 (03/15 0445) BP: (135-142)/(86-101) 142/101 (03/15 0445) SpO2:  [94 %-100 %] 94 % (03/15 0445) Weight:  [86.2 kg (190 lb)] 86.2 kg (190 lb) (03/15 0102)   Physical Exam  Constitutional: Appears well-developed and well-nourished.   Psych: Affect appropriate to situation Eyes: No scleral injection HENT: No OP obstrucion Head: Normocephalic.  Cardiovascular: Normal rate and regular rhythm.  Respiratory: Effort normal, non-labored breathing GI: Soft.  No distension. There is no tenderness.  Skin: WDI  Neuro: Mental Status: Patient is awake, alert, oriented to person, place, month, year, and situation. Patient is able to give a clear and coherent history. No signs of aphasia or neglect Fundus: No obvious papilledema in both eyes Cranial Nerves: II: Visual Fields are full. Pupils are equal, round, and reactive to light.   III,IV, VI: EOMI without ptosis or diploplia.  V: Facial sensation is symmetric to temperature VII: Facial movement is symmetric.  VIII: hearing is intact to voice X: Uvula elevates symmetrically XI: Shoulder shrug is symmetric. XII: tongue is midline without atrophy or fasciculations.  Motor: Tone is normal. Bulk is normal.  5/5 in LUE, RUE and RLE 3/5 to hip flexion, 4/5 to hip extension, 4/5 to knee flexion and extension, 4+/5 dorsiflexion and plantar flexion Clonus in left leg Sensory: Sensation is symmetric to light touch and temperature in the arms and legs. Deep Tendon Reflexes: 4+patellar reflex,ankle reflex on left leg  Plantars: Toes are downgoing bilaterally.  Cerebellar FNF and HKS are intact bilaterally  I have reviewed labs in epic and the results pertinent to this consultation are: CBC, CMP and UA are normal   ASSESSMENT AND PLAN   37 y.o. female with a past medical history significant for MS with baseline left foot drop, left-sided weakness, hypertension, anxiety, depression with worsening left leg weakness. She is on multiple pain medications and muscle relaxants. UA, CBC within normal limits.     MS exacerbation vs pseudoexacerbation  Recommendations: IV Solumedrol 1g daily x 3 days MRI C spine and T spine to differentiate between exacerbation vs  pseudoexacerbation No need to repeat MRI brain, performed last on 04/20/17 If negative for new lesions, stop IV steroids  CKD Cr stable  Consider reducing pain medication, maybe delay in clearance due to impaired renal performance  Karena Addison Aroor MD [facility] [phone]  If 7pm to 7am, please call on call as listed on AMION.

## 2017-05-19 NOTE — H&P (Addendum)
History and Physical    Dawn Peters QPY:[id number] DOB: [birth year] DOA: 05/19/2017  PCP: Armanda Heritage, NP  Patient coming from: [place].   I have personally briefly reviewed patient's old medical records in [place]  Chief Complaint: left sided weakness  HPI: Dawn Peters is a 37 y.o. female with medical history significant of for anxiety, depression, hypertension not on any meds and MS, with residual left sided weakness comes in for worsening left sided weakness for a week now. She denies any headache, nausea, vomiting, syncope, abdominal pain, diarrhea, sob, chest pain, cough, URI, dysuria, . She reports tingling and burning sensation around the left knee for a few weeks now. She denies any fever or chills.  Patient has been on Tecfidera and lemtrada last year and sees Dr Felecia Shelling as outpatient. On arrival to ED, lab work revealed normal cbc and potassium of 3.4 and creatinine of 1.6. UA IS negative. She was referred to [facility] for adm and neurology consulted.   Review of Systems: As per HPI otherwise 10 point review of systems negative.    Past Medical History:  Diagnosis Date  . Anxiety   . Depression   . Hypertension   . MS (multiple sclerosis) ([place])     Past Surgical History:  Procedure Laterality Date  . BILATERAL HIP ARTHROSCOPY Left 07/2013  . JOINT REPLACEMENT Bilateral     R 2004 and L 2005     reports that  has never smoked. she has never used smokeless tobacco. She reports that she does not drink alcohol or use drugs.  Allergies  Allergen Reactions  . Ace Inhibitors Swelling  . Lisinopril Swelling    Lower lip swelling    Family History  Problem Relation Age of Onset  . Healthy Mother   . Healthy Father   . Breast cancer Unknown        paternal grandmother dx age 65  . Colon cancer Unknown        paternal grandmother  . Hypertension Other   . Diabetes Other    Reviewed.   Prior to Admission medications   Medication Sig Start Date End Date  Taking? Authorizing Provider  amitriptyline (ELAVIL) 25 MG tablet TAKE ONE OR TWO AT BEDTIME Patient taking differently: take 25-50mg  by mouth at bedtime 11/09/16  Yes Sater, Nanine Means, MD  amphetamine-dextroamphetamine (ADDERALL XR) 20 MG 24 hr capsule Take 1 capsule (20 mg total) by mouth daily. 04/19/17  Yes Sater, Nanine Means, MD  baclofen (LIORESAL) 20 MG tablet Take 1 tablet (20 mg total) by mouth 3 (three) times daily as needed (for ms). Patient taking differently: Take 20 mg by mouth 4 (four) times daily.  05/04/16  Yes Sater, Nanine Means, MD  busPIRone (BUSPAR) 15 MG tablet Take 1 tablet (15 mg total) by mouth 2 (two) times daily. 05/04/16  Yes Sater, Nanine Means, MD  citalopram (CELEXA) 40 MG tablet Take 1 tablet (40 mg total) by mouth daily. 05/04/16  Yes Sater, Nanine Means, MD  lamoTRIgine (LAMICTAL) 200 MG tablet Take 1 tablet (200 mg total) by mouth 2 (two) times daily. 08/05/16  Yes Sater, Nanine Means, MD  metoprolol tartrate (LOPRESSOR) 25 MG tablet Take 0.5 tablets (12.5 mg total) by mouth 2 (two) times daily. 04/24/17  Yes Theodis Blaze, MD  naproxen (NAPROSYN) 375 MG tablet Take 1 tablet (375 mg total) by mouth 2 (two) times daily. Patient taking differently: Take 375 mg by mouth 2 (two) times daily as needed for  mild pain.  07/15/16  Yes Mabe, Shanon Brow, NP  Oxcarbazepine (TRILEPTAL) 300 MG tablet Take 1 tablet (300 mg total) by mouth 2 (two) times daily. 11/11/15  Yes Sater, Nanine Means, MD  oxyCODONE-acetaminophen (PERCOCET/ROXICET) 5-325 MG tablet Take 1 tablet by mouth 4 (four) times daily. 04/23/17  Yes Theodis Blaze, MD  pregabalin (LYRICA) 200 MG capsule Take 1 capsule (200 mg total) by mouth 2 (two) times daily. 05/17/17  Yes Sater, Nanine Means, MD  SUMAtriptan (IMITREX) 100 MG tablet Take 1 tablet (100 mg total) by mouth once as needed for migraine. May repeat in 2 hours if headache persists or recurs. 12/28/16  Yes Sater, Nanine Means, MD  tamsulosin (FLOMAX) 0.4 MG CAPS capsule Take 0.4 mg by mouth  daily.   Yes [provider]  tiZANidine (ZANAFLEX) 4 MG tablet TAKE 1 TABLET BY MOUTH AT BEDTIME 10/18/16  Yes Sater, Nanine Means, MD  TRI-LO-MARZIA 0.18/0.215/0.25 MG-25 MCG tab TAKE 1 TABLET BY MOUTH DAILY. 11/11/16  Yes Woodroe Mode, MD  valACYclovir (VALTREX) 500 MG tablet Take 1 tablet (500 mg total) by mouth 2 (two) times daily. 03/20/17  Yes Sater, Nanine Means, MD  acetaminophen (TYLENOL) 325 MG tablet Take 2 tablets (650 mg total) by mouth every 4 (four) hours as needed for mild pain (or temp > 37.5 C (99.5 F)). Patient not taking: Reported on 05/17/2017 04/24/17   Theodis Blaze, MD  amLODipine (NORVASC) 5 MG tablet Take 1 tablet (5 mg total) daily by mouth. Patient not taking: Reported on 05/17/2017 01/10/17   Daleen Bo, MD  dalfampridine 10 MG TB12 Take 1 tablet (10 mg total) by mouth 2 (two) times daily. Patient not taking: Reported on 05/19/2017 08/08/16   Sater, Nanine Means, MD  gabapentin (NEURONTIN) 800 MG tablet Take 1 tablet (800 mg total) by mouth 3 (three) times daily. Patient not taking: Reported on 05/19/2017 09/06/16   Sater, Nanine Means, MD  TRI-LO-MARZIA 0.18/0.215/0.25 MG-25 MCG tab TAKE 1 TABLET BY MOUTH DAILY. Patient not taking: Reported on 05/19/2017 02/05/16   Woodroe Mode, MD    Physical Exam: Vitals:   05/19/17 0102 05/19/17 0445 05/19/17 0700 05/19/17 0807  BP:  (!) 142/101 (!) 145/103 (!) 152/102  Pulse:  91 86 88  Resp:  18 18 18   Temp:    98.3 F (36.8 C)  TempSrc:    Oral  SpO2:  94% 94% 98%  Weight: 86.2 kg (190 lb)     Height: 5\' 5"  (1.651 m)       Constitutional: NAD, calm, comfortable Vitals:   05/19/17 0102 05/19/17 0445 05/19/17 0700 05/19/17 0807  BP:  (!) 142/101 (!) 145/103 (!) 152/102  Pulse:  91 86 88  Resp:  18 18 18   Temp:    98.3 F (36.8 C)  TempSrc:    Oral  SpO2:  94% 94% 98%  Weight: 86.2 kg (190 lb)     Height: 5\' 5"  (1.651 m)      Eyes: PERRL, lids and conjunctivae normal ENMT: Mucous membranes are moist. Posterior  pharynx clear of any exudate or lesions.Normal dentition.  Neck: normal, supple, no masses, no thyromegaly Respiratory: clear to auscultation bilaterally, no wheezing, no crackles. Normal respiratory effort. No accessory muscle use.  Cardiovascular: Regular rate and rhythm, no murmurs / rubs / gallops. No extremity edema. 2+ pedal pulses. No carotid bruits.  Abdomen: no tenderness, no masses palpated. No hepatosplenomegaly. Bowel sounds positive.  Musculoskeletal: no clubbing / cyanosis. No joint deformity  upper and lower extremities. Good ROM, no contractures. Normal muscle tone.  Skin: no rashes, lesions, ulcers. No induration Neurologic: CN 2-12 grossly intact. Sensation intact, DTR normal.  Psychiatric:  Alert and oriented x 3. Normal mood.     Labs on Admission: I have personally reviewed following labs and imaging studies  CBC: Recent Labs  Lab 05/19/17 0336  WBC 6.8  NEUTROABS 5.4  HGB 13.5  HCT 39.0  MCV 95.8  PLT 161   Basic Metabolic Panel: Recent Labs  Lab 05/17/17 1242 05/19/17 0336  NA 142 141  K 4.1 3.4*  CL 99 102  CO2 23 27  GLUCOSE 104* 97  BUN 28* 30*  CREATININE 1.70* 1.60*  CALCIUM 9.6 9.5   GFR: Estimated Creatinine Clearance: 51.7 mL/min (A) (by C-G formula based on SCr of 1.6 mg/dL (H)). Liver Function Tests: Recent Labs  Lab 05/19/17 0336  AST 20  ALT 20  ALKPHOS 83  BILITOT 0.5  PROT 7.6  ALBUMIN 4.1   No results for input(s): LIPASE, AMYLASE in the last 168 hours. No results for input(s): AMMONIA in the last 168 hours. Coagulation Profile: No results for input(s): INR, PROTIME in the last 168 hours. Cardiac Enzymes: No results for input(s): CKTOTAL, CKMB, CKMBINDEX, TROPONINI in the last 168 hours. BNP (last 3 results) No results for input(s): PROBNP in the last 8760 hours. HbA1C: No results for input(s): HGBA1C in the last 72 hours. CBG: No results for input(s): GLUCAP in the last 168 hours. Lipid Profile: No results for  input(s): CHOL, HDL, LDLCALC, TRIG, CHOLHDL, LDLDIRECT in the last 72 hours. Thyroid Function Tests: No results for input(s): TSH, T4TOTAL, FREET4, T3FREE, THYROIDAB in the last 72 hours. Anemia Panel: No results for input(s): VITAMINB12, FOLATE, FERRITIN, TIBC, IRON, RETICCTPCT in the last 72 hours. Urine analysis:    Component Value Date/Time   COLORURINE YELLOW 05/19/2017 0211   APPEARANCEUR CLEAR 05/19/2017 0211   APPEARANCEUR Clear 04/19/2016 1419   LABSPEC 1.020 05/19/2017 0211   PHURINE 6.0 05/19/2017 0211   GLUCOSEU NEGATIVE 05/19/2017 0211   HGBUR NEGATIVE 05/19/2017 0211   BILIRUBINUR NEGATIVE 05/19/2017 0211   BILIRUBINUR Negative 04/19/2016 1419   KETONESUR NEGATIVE 05/19/2017 0211   PROTEINUR NEGATIVE 05/19/2017 0211   UROBILINOGEN 0.2 12/15/2016 1136   NITRITE NEGATIVE 05/19/2017 0211   LEUKOCYTESUR NEGATIVE 05/19/2017 0211   LEUKOCYTESUR Negative 04/19/2016 1419    Radiological Exams on Admission: No results found.  EKG: not done.  Assessment/Plan Active Problems:   Multiple sclerosis exacerbation (HCC)   MS Exacerbation:  Started on IV steroids, daily for 3 days by neurology.  MRI cervical spine and thoracic spine. Daily ppi.  Start her on SSI for hyperglycemia from steroids.    Stage 2 CKD:  Pt reports that she is unaware of her kidney dysfunction. Suspect possibly from [place]. Creatinine appears to be at baseline around 1.6. Which has been over the last one year.  Meanwhile US renal, urine electrolytes ordered.  Monitor.   Uncontrolled hypertension:  Pt reports she is on norvasc at home, which she takes intermittently.  Resume norvasc and add prn hydralazine.   Hypokalemia: replaced.     DVT prophylaxis: lovenox.  Code Status: full code.  Family Communication:none at bedside.  Disposition Plan: pending resolution of the weakness. Consults called: neurology.  Admission status: inpatient/    Hosie Poisson MD [facility] Pager  [phone]  If 7PM-7AM, please contact night-coverage [email or web address] Password [email or web address]  05/19/2017, 8:36 AM

## 2017-05-19 NOTE — ED Notes (Signed)
ED TO INPATIENT HANDOFF REPORT  Name/Age/Gender Dawn Peters 37 y.o. female  Code Status Code Status History    Date Active Date Inactive Code Status Order ID Comments User Context   04/20/2017 05:04 04/24/2017 20:39 Full Code [id number]  Norval Morton, MD ED   08/13/2016 03:51 08/14/2016 18:08 Full Code [id number]  Vianne Bulls, MD ED   12/22/2015 01:05 12/23/2015 21:57 Full Code [id number]  [name], DO Inpatient   07/17/2015 09:56 07/19/2015 13:40 Full Code [id number]  Kelvin Cellar, MD Inpatient      Home/SNF/Other Home  Chief Complaint MS Flare/unable to walk  Level of Care/Admitting Diagnosis ED Disposition    ED Disposition Condition [place] [100102]  Level of Care: Med-Surg [16]  Diagnosis: Multiple sclerosis exacerbation [facility]) [536644]  Admitting Physician: Rise Patience [phone]  Attending Physician: Rise Patience [email or web address]  PT Class (Do Not Modify): Observation [104]  PT Acc Code (Do Not Modify): Observation [10022]       Medical History Past Medical History:  Diagnosis Date  . Anxiety   . Depression   . Hypertension   . MS (multiple sclerosis) (HCC)     Allergies Allergies  Allergen Reactions  . Ace Inhibitors Swelling  . Lisinopril Swelling    Lower lip swelling    IV Location/Drains/Wounds Patient Lines/Drains/Airways Status   Active Line/Drains/Airways    Name:   Placement date:   Placement time:   Site:   Days:   Peripheral IV 05/19/17 Left Hand   05/19/17    0140    Hand   less than 1          Labs/Imaging Results for orders placed or performed during the hospital encounter of 05/19/17 (from the past 48 hour(s))  Urinalysis, Routine w reflex microscopic     Status: None   Collection Time: 05/19/17  2:11 AM  Result Value Ref Range   Color, Urine YELLOW YELLOW   APPearance CLEAR CLEAR   Specific Gravity, Urine 1.020 1.005 - 1.030   pH 6.0 5.0 - 8.0   Glucose, UA NEGATIVE NEGATIVE mg/dL   Hgb urine dipstick NEGATIVE NEGATIVE   Bilirubin Urine NEGATIVE NEGATIVE   Ketones, ur NEGATIVE NEGATIVE mg/dL   Protein, ur NEGATIVE NEGATIVE mg/dL   Nitrite NEGATIVE NEGATIVE   Leukocytes, UA NEGATIVE NEGATIVE    Comment: Performed at [facility], [place] [address], [place], [place] [address]  POC urine preg, ED (not at [facility])     Status: None   Collection Time: 05/19/17  2:20 AM  Result Value Ref Range   Preg Test, Ur NEGATIVE NEGATIVE    Comment:        THE SENSITIVITY OF THIS METHODOLOGY IS >24 mIU/mL   CBC with Differential/Platelet     Status: None   Collection Time: 05/19/17  3:36 AM  Result Value Ref Range   WBC 6.8 4.0 - 10.5 K/uL   RBC 4.07 3.87 - 5.11 MIL/uL   Hemoglobin 13.5 12.0 - 15.0 g/dL   HCT 39.0 36.0 - 46.0 %   MCV 95.8 78.0 - 100.0 fL   MCH 33.2 26.0 - 34.0 pg   MCHC 34.6 30.0 - 36.0 g/dL   RDW 14.9 11.5 - 15.5 %   Platelets 301 150 - 400 K/uL   Neutrophils Relative % 81 %   Neutro Abs 5.4 1.7 - 7.7 K/uL   Lymphocytes Relative 12 %   Lymphs Abs 0.8  0.7 - 4.0 K/uL   Monocytes Relative 7 %   Monocytes Absolute 0.5 0.1 - 1.0 K/uL   Eosinophils Relative 0 %   Eosinophils Absolute 0.0 0.0 - 0.7 K/uL   Basophils Relative 0 %   Basophils Absolute 0.0 0.0 - 0.1 K/uL    Comment: Performed at [facility], [place] [address] [place], [place] [address]  Comprehensive metabolic panel     Status: Abnormal   Collection Time: 05/19/17  3:36 AM  Result Value Ref Range   Sodium 141 135 - 145 mmol/L   Potassium 3.4 (L) 3.5 - 5.1 mmol/L   Chloride 102 101 - 111 mmol/L   CO2 27 22 - 32 mmol/L   Glucose, Bld 97 65 - 99 mg/dL   BUN 30 (H) 6 - 20 mg/dL   Creatinine, Ser 1.60 (H) 0.44 - 1.00 mg/dL   Calcium 9.5 8.9 - 10.3 mg/dL   Total Protein 7.6 6.5 - 8.1 g/dL   Albumin 4.1 3.5 - 5.0 g/dL   AST 20 15 - 41 U/L   ALT 20 14 - 54 U/L   Alkaline Phosphatase 83 38 - 126 U/L   Total Bilirubin 0.5 0.3 -  1.2 mg/dL   GFR calc non Af Amer 40 (L) >60 mL/min   GFR calc Af Amer 46 (L) >60 mL/min    Comment: (NOTE) The eGFR has been calculated using the CKD EPI equation. This calculation has not been validated in all clinical situations. eGFR's persistently <60 mL/min signify possible Chronic Kidney Disease.    Anion gap 12 5 - 15    Comment: Performed at [facility], [place] [address], [place], [place] [address]   No results found.  Pending Labs Unresulted Labs (From admission, onward)   None      Vitals/Pain Today's Vitals   05/19/17 0101 05/19/17 0102 05/19/17 0445 05/19/17 0700  BP:   (!) 142/101 (!) 145/103  Pulse:   91 86  Resp:   18 18  Temp:      TempSrc:      SpO2:   94% 94%  Weight:  190 lb (86.2 kg)    Height:  _0  (1.651 m)    PainSc: 9        Isolation Precautions No active isolations  Medications Medications  methylPREDNISolone sodium succinate (SOLU-MEDROL) 1,000 mg in sodium chloride 0.9 % 50 mL IVPB (0 mg Intravenous Stopped 05/19/17 0630)    Mobility non-ambulatory

## 2017-05-19 NOTE — ED Triage Notes (Signed)
Patient arrives by [facility] with complaints of MS flare for the last 24 hours. Patient reported to EMS that she has been unable to walk for the past 4 hours and has lost her fine motor skills in the last 4 hours. Patient reported the last time this happened she ended up unresponsive and in ICU for a week. BP 134/92 HR 60. CBG 160

## 2017-05-19 NOTE — Telephone Encounter (Signed)
Pt is calling from hospital re: her MS she states she was just informed that she has stage 2 kidney disease and no one has ever told her.  Pt is asking to be called to discuss this as soon as possible

## 2017-05-19 NOTE — ED Notes (Signed)
Bed: AU45 Expected date:  Expected time:  Means of arrival:  Comments: EMS 37 yo female MS flare

## 2017-05-19 NOTE — ED Notes (Signed)
Attempted to get blood but unsuccessful

## 2017-05-19 NOTE — Consult Note (Signed)
Urology Consult   Physician requesting consult: Dr. Karleen Hampshire  Reason for consult: Left hydronephrosis  History of Present Illness: Dawn Peters is a 37 y.o. with multiple sclerosis since 2000.  She is currently admitted with an exacerbation of her MS manifested by decreased motor function of her left lower extremity.  She maintains fairly good dexterity of her bilateral upper extremities.  She is followed urologically by Dr. Burman Nieves and was evaluated in December 2018.  She was apparently to be scheduled for urodynamics but this did not occur for unclear reasons.  As part of her evaluation during this hospitalization, she did have a thoracic MRI which incidentally demonstrated severe left hydronephrosis.  Reviewing her prior MRI from June 2018, the upper kidney was able to be visualized and indicated hydronephrosis at that time.  Her Cr appears to be around 1.2 to 1.6 at baseline.  She had denied any flank pain, fever, hematuria, or abdominal pain.  She has no history of urolithiasis.  She does have difficulty with bladder emptying and does have to strain to void.  However, she feels that she subjectively empties to completion.  She's had no history of UTIs or pyelonephritis.  She denies a history of voiding or storage urinary symptoms, hematuria, UTIs, STDs, urolithiasis, GU malignancy/trauma/surgery.  Past Medical History:  Diagnosis Date  . Anxiety   . Depression   . Hypertension   . MS (multiple sclerosis) ([place])     Past Surgical History:  Procedure Laterality Date  . BILATERAL HIP ARTHROSCOPY Left 07/2013  . JOINT REPLACEMENT Bilateral     R 2004 and L 2005     Current Hospital Medications:  Home meds:  Current Meds  Medication Sig  . amitriptyline (ELAVIL) 25 MG tablet TAKE ONE OR TWO AT BEDTIME (Patient taking differently: take 25-50mg  by mouth at bedtime)  . amphetamine-dextroamphetamine (ADDERALL XR) 20 MG 24 hr capsule Take 1 capsule (20 mg total) by mouth daily.  .  baclofen (LIORESAL) 20 MG tablet Take 1 tablet (20 mg total) by mouth 3 (three) times daily as needed (for ms). (Patient taking differently: Take 20 mg by mouth 4 (four) times daily. )  . busPIRone (BUSPAR) 15 MG tablet Take 1 tablet (15 mg total) by mouth 2 (two) times daily.  . citalopram (CELEXA) 40 MG tablet Take 1 tablet (40 mg total) by mouth daily.  Marland Kitchen lamoTRIgine (LAMICTAL) 200 MG tablet Take 1 tablet (200 mg total) by mouth 2 (two) times daily.  . metoprolol tartrate (LOPRESSOR) 25 MG tablet Take 0.5 tablets (12.5 mg total) by mouth 2 (two) times daily.  . naproxen (NAPROSYN) 375 MG tablet Take 1 tablet (375 mg total) by mouth 2 (two) times daily. (Patient taking differently: Take 375 mg by mouth 2 (two) times daily as needed for mild pain. )  . Oxcarbazepine (TRILEPTAL) 300 MG tablet Take 1 tablet (300 mg total) by mouth 2 (two) times daily.  Marland Kitchen oxyCODONE-acetaminophen (PERCOCET/ROXICET) 5-325 MG tablet Take 1 tablet by mouth 4 (four) times daily.  . pregabalin (LYRICA) 200 MG capsule Take 1 capsule (200 mg total) by mouth 2 (two) times daily.  . SUMAtriptan (IMITREX) 100 MG tablet Take 1 tablet (100 mg total) by mouth once as needed for migraine. May repeat in 2 hours if headache persists or recurs.  . tamsulosin (FLOMAX) 0.4 MG CAPS capsule Take 0.4 mg by mouth daily.  Marland Kitchen tiZANidine (ZANAFLEX) 4 MG tablet TAKE 1 TABLET BY MOUTH AT BEDTIME  . TRI-LO-MARZIA 0.18/0.215/0.25 MG-25  MCG tab TAKE 1 TABLET BY MOUTH DAILY.  . valACYclovir (VALTREX) 500 MG tablet Take 1 tablet (500 mg total) by mouth 2 (two) times daily.    Scheduled Meds: . amitriptyline  50 mg Oral QHS  . amLODipine  5 mg Oral Daily  . amphetamine-dextroamphetamine  20 mg Oral Daily  . busPIRone  15 mg Oral BID  . citalopram  40 mg Oral Daily  . enoxaparin (LOVENOX) injection  40 mg Subcutaneous Q24H  . insulin aspart  0-9 Units Subcutaneous TID WC  . lamoTRIgine  200 mg Oral BID  . metoprolol tartrate  12.5 mg Oral BID  .  pantoprazole  40 mg Oral Q0600  . pregabalin  200 mg Oral BID   Continuous Infusions: PRN Meds:.baclofen, hydrALAZINE, oxyCODONE-acetaminophen  Allergies:  Allergies  Allergen Reactions  . Ace Inhibitors Swelling  . Lisinopril Swelling    Lower lip swelling    Family History  Problem Relation Age of Onset  . Healthy Mother   . Healthy Father   . Breast cancer Unknown        paternal grandmother dx age 32  . Colon cancer Unknown        paternal grandmother  . Hypertension Other   . Diabetes Other     Social History:  reports that  has never smoked. she has never used smokeless tobacco. She reports that she does not drink alcohol or use drugs.  ROS: A complete review of systems was performed.  All systems are negative except for pertinent findings as noted.  Physical Exam:  Vital signs in last 24 hours: Temp:  [98.3 F (36.8 C)-98.4 F (36.9 C)] 98.4 F (36.9 C) (03/15 1444) Pulse Rate:  [86-109] 109 (03/15 1444) Resp:  [18-21] 18 (03/15 1444) BP: (135-152)/(58-103) 147/58 (03/15 1444) SpO2:  [94 %-100 %] 98 % (03/15 1444) Weight:  [86.2 kg (190 lb)] 86.2 kg (190 lb) (03/15 0102) Constitutional:  Alert and oriented, No acute distress Cardiovascular: Regular rate and rhythm, No JVD Respiratory: Normal respiratory effort, Lungs clear bilaterally GI: Abdomen is soft, nontender, nondistended, no abdominal masses GU: No CVA tenderness Lymphatic: No lymphadenopathy Neurologic: Decreased motor function of left lower extremity. Psychiatric: Normal mood and affect  Laboratory Data:  Recent Labs    05/19/17 0336  WBC 6.8  HGB 13.5  HCT 39.0  PLT 301    Recent Labs    05/17/17 1242 05/19/17 0336  NA 142 141  K 4.1 3.4*  CL 99 102  GLUCOSE 104* 97  BUN 28* 30*  CALCIUM 9.6 9.5  CREATININE 1.70* 1.60*     Results for orders placed or performed during the hospital encounter of 05/19/17 (from the past 24 hour(s))  Urinalysis, Routine w reflex microscopic      Status: None   Collection Time: 05/19/17  2:11 AM  Result Value Ref Range   Color, Urine YELLOW YELLOW   APPearance CLEAR CLEAR   Specific Gravity, Urine 1.020 1.005 - 1.030   pH 6.0 5.0 - 8.0   Glucose, UA NEGATIVE NEGATIVE mg/dL   Hgb urine dipstick NEGATIVE NEGATIVE   Bilirubin Urine NEGATIVE NEGATIVE   Ketones, ur NEGATIVE NEGATIVE mg/dL   Protein, ur NEGATIVE NEGATIVE mg/dL   Nitrite NEGATIVE NEGATIVE   Leukocytes, UA NEGATIVE NEGATIVE  POC urine preg, ED (not at [facility])     Status: None   Collection Time: 05/19/17  2:20 AM  Result Value Ref Range   Preg Test, Ur NEGATIVE NEGATIVE  CBC with  Differential/Platelet     Status: None   Collection Time: 05/19/17  3:36 AM  Result Value Ref Range   WBC 6.8 4.0 - 10.5 K/uL   RBC 4.07 3.87 - 5.11 MIL/uL   Hemoglobin 13.5 12.0 - 15.0 g/dL   HCT 39.0 36.0 - 46.0 %   MCV 95.8 78.0 - 100.0 fL   MCH 33.2 26.0 - 34.0 pg   MCHC 34.6 30.0 - 36.0 g/dL   RDW 14.9 11.5 - 15.5 %   Platelets 301 150 - 400 K/uL   Neutrophils Relative % 81 %   Neutro Abs 5.4 1.7 - 7.7 K/uL   Lymphocytes Relative 12 %   Lymphs Abs 0.8 0.7 - 4.0 K/uL   Monocytes Relative 7 %   Monocytes Absolute 0.5 0.1 - 1.0 K/uL   Eosinophils Relative 0 %   Eosinophils Absolute 0.0 0.0 - 0.7 K/uL   Basophils Relative 0 %   Basophils Absolute 0.0 0.0 - 0.1 K/uL  Comprehensive metabolic panel     Status: Abnormal   Collection Time: 05/19/17  3:36 AM  Result Value Ref Range   Sodium 141 135 - 145 mmol/L   Potassium 3.4 (L) 3.5 - 5.1 mmol/L   Chloride 102 101 - 111 mmol/L   CO2 27 22 - 32 mmol/L   Glucose, Bld 97 65 - 99 mg/dL   BUN 30 (H) 6 - 20 mg/dL   Creatinine, Ser 1.60 (H) 0.44 - 1.00 mg/dL   Calcium 9.5 8.9 - 10.3 mg/dL   Total Protein 7.6 6.5 - 8.1 g/dL   Albumin 4.1 3.5 - 5.0 g/dL   AST 20 15 - 41 U/L   ALT 20 14 - 54 U/L   Alkaline Phosphatase 83 38 - 126 U/L   Total Bilirubin 0.5 0.3 - 1.2 mg/dL   GFR calc non Af Amer 40 (L) >60 mL/min   GFR calc Af Amer 46  (L) >60 mL/min   Anion gap 12 5 - 15  Glucose, capillary     Status: Abnormal   Collection Time: 05/19/17  1:31 PM  Result Value Ref Range   Glucose-Capillary 192 (H) 65 - 99 mg/dL  Glucose, capillary     Status: Abnormal   Collection Time: 05/19/17  4:24 PM  Result Value Ref Range   Glucose-Capillary 118 (H) 65 - 99 mg/dL  Glucose, capillary     Status: Abnormal   Collection Time: 05/19/17  5:07 PM  Result Value Ref Range   Glucose-Capillary 180 (H) 65 - 99 mg/dL   No results found for this or any previous visit (from the past 240 hour(s)).  Renal Function: Recent Labs    05/17/17 1242 05/19/17 0336  CREATININE 1.70* 1.60*   Estimated Creatinine Clearance: 51.7 mL/min (A) (by C-G formula based on SCr of 1.6 mg/dL (H)).  Radiologic Imaging: Ct Abdomen Pelvis Wo Contrast  Result Date: 05/19/2017 CLINICAL DATA:  Hydronephrosis. Multiple sclerosis with recent flare. Stage II renal failure. EXAM: CT ABDOMEN AND PELVIS WITHOUT CONTRAST TECHNIQUE: Multidetector CT imaging of the abdomen and pelvis was performed following the standard protocol without IV contrast. COMPARISON:  Renal ultrasound 04/09/2010. FINDINGS: Lower chest: Minimal atelectasis is present at the left lung base. The lung bases are otherwise clear. The heart size is normal. No significant pleural or pericardial effusion is present. Hepatobiliary: A hypodense lesion at the dome of the right lobe of the liver on image 11 of series 2 measures 8 mm. At least 2 additional subcentimeter lesions are  present in the right lobe. A hypodense lesion posteriorly in the right lobe is well-defined, measuring 12 x 12 x 13 mm. Pancreas: Unremarkable. No pancreatic ductal dilatation or surrounding inflammatory changes. Spleen: Normal in size without focal abnormality. Adrenals/Urinary Tract: The right adrenal gland scratched at the adrenal glands are normal bilaterally. The right kidney in ureter is within normal limits. Severe left-sided  hydronephrosis is present. There is marked tortuosity of the left ureter throughout its course in the abdomen. There is focal stenosis at the pelvic inlet in the region of the left adnexa. Stomach/Bowel: Stomach and duodenum are within normal limits. Small bowel is normal. Terminal ileum is within normal limits. The appendix is visualized and normal. The ascending and transverse colon are within normal limits. The descending and sigmoid colon are normal. Vascular/Lymphatic: No significant vascular findings are present. No enlarged abdominal or pelvic lymph nodes. Reproductive: The uterus is heterogeneous and somewhat irregular, suggesting fibroids. Adnexa are within normal limits. Other: No abdominal wall hernia or abnormality. No abdominopelvic ascites. Musculoskeletal: Vertebral body heights and alignment are normal. The bony pelvis is intact. 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 Hips are located. IMPRESSION: 1. Severe left-sided hydronephrosis. 2. The left ureter is dilated to the pelvic inlet. No definite obstructing lesion is present. Recommend urology consultation for further evaluation. This is a nonemergent process. 3. Hypodense lesions of the liver likely represent benign hepatic cysts. Consider MRI of the liver without and with contrast for definitive characterization. Electronically Signed   By: San Morelle M.D.   On: 05/19/2017 16:39   Mr Cervical Spine W Wo Contrast  Result Date: 05/19/2017 CLINICAL DATA:  History of multiple sclerosis. Unable to walk. Left leg weakness and numbness. EXAM: MRI CERVICAL AND thoracic SPINE WITHOUT AND WITH CONTRAST TECHNIQUE: Multiplanar and multiecho pulse sequences of the cervical spine, to include the craniocervical junction and cervicothoracic junction, and thoracic spine, were obtained without and with intravenous contrast. CONTRAST:  88mL MULTIHANCE GADOBENATE DIMEGLUMINE 529 MG/ML IV SOLN COMPARISON:  08/13/2016 FINDINGS: MRI CERVICAL SPINE FINDINGS  Alignment: Normal Vertebrae: Normal Cord: Chronic cord atrophy with low level T2 signal in the diffuse central cord, similar to the previous exams. No sign of new focal involvement. No abnormal contrast enhancement. Posterior Fossa, vertebral arteries, paraspinal tissues: See results of previous brain study. Disc levels: No degenerative disc disease. No facet arthropathy. No stenosis or neural compression. MRI thoracic SPINE FINDINGS Alignment:  Normal Vertebrae:  No fracture or primary bone lesion. Cord: No focal cord lesion. Mild generalized cord atrophy. Question low level T2 signal as seen previously that could represent chronic demyelinating change. No abnormal contrast enhancement. Paraspinal and other soft tissues: Chronic left hydronephrosis. Disc levels: No significant degenerative changes. No stenosis of the canal or foramina. IMPRESSION: No new or progressive finding. This patient appears to have some atrophic change of the cervical and thoracic cord and as seen previously, has very low level fairly homogeneous T2 signal in the cord best shown on the gradient echo imaging. No focal contrast enhancement or cord swelling. Left hydroureteronephrosis as previously seen. Has this been evaluated? Electronically Signed   By: Nelson Chimes M.D.   On: 05/19/2017 13:09   Mr Thoracic Spine W Wo Contrast  Result Date: 05/19/2017 CLINICAL DATA:  History of multiple sclerosis. Unable to walk. Left leg weakness and numbness. EXAM: MRI CERVICAL AND thoracic SPINE WITHOUT AND WITH CONTRAST TECHNIQUE: Multiplanar and multiecho pulse sequences of the cervical spine, to include the craniocervical junction and cervicothoracic junction, and  thoracic spine, were obtained without and with intravenous contrast. CONTRAST:  11mL MULTIHANCE GADOBENATE DIMEGLUMINE 529 MG/ML IV SOLN COMPARISON:  08/13/2016 FINDINGS: MRI CERVICAL SPINE FINDINGS Alignment: Normal Vertebrae: Normal Cord: Chronic cord atrophy with low level T2 signal  in the diffuse central cord, similar to the previous exams. No sign of new focal involvement. No abnormal contrast enhancement. Posterior Fossa, vertebral arteries, paraspinal tissues: See results of previous brain study. Disc levels: No degenerative disc disease. No facet arthropathy. No stenosis or neural compression. MRI thoracic SPINE FINDINGS Alignment:  Normal Vertebrae:  No fracture or primary bone lesion. Cord: No focal cord lesion. Mild generalized cord atrophy. Question low level T2 signal as seen previously that could represent chronic demyelinating change. No abnormal contrast enhancement. Paraspinal and other soft tissues: Chronic left hydronephrosis. Disc levels: No significant degenerative changes. No stenosis of the canal or foramina. IMPRESSION: No new or progressive finding. This patient appears to have some atrophic change of the cervical and thoracic cord and as seen previously, has very low level fairly homogeneous T2 signal in the cord best shown on the gradient echo imaging. No focal contrast enhancement or cord swelling. Left hydroureteronephrosis as previously seen. Has this been evaluated? Electronically Signed   By: Nelson Chimes M.D.   On: 05/19/2017 13:09   Dg Knee Complete 4 Views Left  Result Date: 05/19/2017 CLINICAL DATA:  Pain and swelling EXAM: LEFT KNEE - COMPLETE 4+ VIEW COMPARISON:  April 13, 2015 FINDINGS: Frontal, lateral, and bilateral oblique views were obtained. No evident fracture or dislocation. No joint effusion. There is no appreciable joint space narrowing. There is a small osteochondral defect along the lateral distal femoral condyle. No frank erosion. IMPRESSION: No fracture or joint effusion. 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 No loose body. No frank erosion. No appreciable joint space narrowing. Electronically Signed   By: Lowella Grip III M.D.   On: 05/19/2017 17:06    I  independently reviewed the above imaging studies.  Impression/Recommendation: Left hydronephrosis with neurogenic bladder:  She appears to have chronic left hydroureteronephrosis with dilation of the ureter down to the pelvis near the UVJ without any clear evidence of an etiology for an obstruction. Possibilities include reflux due to high pressure neurogenic bladder, megaureter, other non-obvious source of obstruction, etc. In the setting of stable renal function and absence of infection, there is no indication for acute intervention or further evaluation. Further evaluation with a Lasix renogram and anatomic evaluation with cystoscopy and retrograde pyelography can be performed in the outpatient setting.  She also will need to proceed with outpatient urodynamic evaluation as had been ordered by Dr. Louis Meckel to assess her bladder function.    Lunetta Marina,LES 05/19/2017, 6:25 PM  Pryor Curia. MD   CC: Dr. Karleen Hampshire

## 2017-05-20 DIAGNOSIS — I10 Essential (primary) hypertension: Secondary | ICD-10-CM | POA: Diagnosis not present

## 2017-05-20 DIAGNOSIS — R7989 Other specified abnormal findings of blood chemistry: Secondary | ICD-10-CM | POA: Diagnosis not present

## 2017-05-20 DIAGNOSIS — R739 Hyperglycemia, unspecified: Secondary | ICD-10-CM

## 2017-05-20 DIAGNOSIS — R29898 Other symptoms and signs involving the musculoskeletal system: Secondary | ICD-10-CM | POA: Diagnosis not present

## 2017-05-20 DIAGNOSIS — G35 Multiple sclerosis: Secondary | ICD-10-CM | POA: Diagnosis not present

## 2017-05-20 DIAGNOSIS — N289 Disorder of kidney and ureter, unspecified: Secondary | ICD-10-CM | POA: Diagnosis not present

## 2017-05-20 LAB — GLUCOSE, CAPILLARY
GLUCOSE-CAPILLARY: 186 mg/dL — AB (ref 65–99)
GLUCOSE-CAPILLARY: 191 mg/dL — AB (ref 65–99)
Glucose-Capillary: 149 mg/dL — ABNORMAL HIGH (ref 65–99)
Glucose-Capillary: 211 mg/dL — ABNORMAL HIGH (ref 65–99)
Glucose-Capillary: 161 mg/dL — ABNORMAL HIGH (ref 65–99)

## 2017-05-20 MED ORDER — TRAZODONE HCL 50 MG PO TABS
50.0000 mg | ORAL_TABLET | Freq: Once | ORAL | Status: AC
  Administered 2017-05-20: 50 mg via ORAL
  Filled 2017-05-20: qty 1

## 2017-05-20 NOTE — Progress Notes (Addendum)
PROGRESS NOTE    Dawn Peters  QPY:[id number] DOB: 1980/10/26 DOA: 05/19/2017 PCP: Armanda Heritage, NP    Brief Narrative:  37 year old female who presented with left-sided weakness.  She does have the significant past medical history for anxiety, depression, hypertension and multiple sclerosis.  Presented with worsening weakness for the last 7 days prior to hospitalization.  On the initial physical examination blood pressure 142/101, heart rate 91, respiratory 18, temperature 98.3, oxygen saturation 94%.  Lungs clear to auscultation bilaterally, heart S1-S2 present rhythmic, the abdomen was soft nontender, no lower extremity edema.  Noted left lower extremity weakness.  Patient was admitted with a working diagnosis of multiple sclerosis exacerbation, to rule out pseudo-exacerbation.  Assessment & Plan:   Active Problems:   Essential hypertension   Multiple sclerosis exacerbation (HCC)   Left leg weakness   Mild renal insufficiency   Elevated serum creatinine   1.  Multiple sclerosis exacerbation. Patient with mild improvement in her symptoms, still with positive dis-balance on ambulation, will continue high dose systemic corticosteroids with methylprednisolone, will continue neuro checks. Follow on neurology recommendations.   2.  Chronic kidney disease 3. Suspected baseline serum cr at 1,6, K at 3,4, will continue close follow up on renal functio and electrolytes, avoid hypotension or nephrotoxic medications.   3.  Uncontrolled hypertension. Continue amlodipine 5 mg daily, metoprolol and as needed hydralazine.   4. Depression. No confusion or agitation, will continue trazodone, lamotrigine, citalopram, buspirone, and amitriptyline.   5. Steroid induced hyperglycemia. Will continue insulin sliding scale for glucose cover and monitoring. Patient tolerating po well. On high dose steroids. Capillary glucose 118, 180, 147, 149, 211. Hb A1c 5,3.     DVT prophylaxis: enoxaparin  Code  Status: full Family Communication: no family at the bedside Disposition Plan: home when completes IV steroids 3 doses.   Consultants:   Neurology   Procedures:     Antimicrobials:       Subjective: Patient is feeling better, continue to improve weakness, persistent dis-balance, no nausea, no vomiting.   Objective: Vitals:   05/19/17 0807 05/19/17 1444 05/19/17 2109 05/20/17 0543  BP: (!) 152/102 (!) 147/58 (!) 134/98 121/86  Pulse: 88 (!) 109 (!) 103 98  Resp: 18 18 18 18   Temp: 98.3 F (36.8 C) 98.4 F (36.9 C) 98.1 F (36.7 C) 97.8 F (36.6 C)  TempSrc: Oral Oral Oral Oral  SpO2: 98% 98% 100% 99%  Weight:      Height:        Intake/Output Summary (Last 24 hours) at 05/20/2017 1335 Last data filed at 05/19/2017 1600 Gross per 24 hour  Intake 240 ml  Output -  Net 240 ml   Filed Weights   05/19/17 0102  Weight: 86.2 kg (190 lb)    Examination:   General: Not in pain or dyspnea, deconditioned Neurology: Awake and alert, positive mild chorea, strength preserved at the lower extremities.   E ENT: mild pallor, no icterus, oral mucosa moist Cardiovascular: No JVD. S1-S2 present, rhythmic, no gallops, rubs, or murmurs. No lower extremity edema. Pulmonary: vesicular breath sounds bilaterally, adequate air movement, no wheezing, rhonchi or rales. Gastrointestinal. Abdomen with no organomegaly, non tender, no rebound or guarding Skin. No rashes Musculoskeletal: no joint deformities     Data Reviewed: I have personally reviewed following labs and imaging studies  CBC: Recent Labs  Lab 05/19/17 0336  WBC 6.8  NEUTROABS 5.4  HGB 13.5  HCT 39.0  MCV 95.8  PLT 301  Basic Metabolic Panel: Recent Labs  Lab 05/17/17 1242 05/19/17 0336  NA 142 141  K 4.1 3.4*  CL 99 102  CO2 23 27  GLUCOSE 104* 97  BUN 28* 30*  CREATININE 1.70* 1.60*  CALCIUM 9.6 9.5   GFR: Estimated Creatinine Clearance: 51.7 mL/min (A) (by C-G formula based on SCr of 1.6  mg/dL (H)). Liver Function Tests: Recent Labs  Lab 05/19/17 0336  AST 20  ALT 20  ALKPHOS 83  BILITOT 0.5  PROT 7.6  ALBUMIN 4.1   No results for input(s): LIPASE, AMYLASE in the last 168 hours. No results for input(s): AMMONIA in the last 168 hours. Coagulation Profile: No results for input(s): INR, PROTIME in the last 168 hours. Cardiac Enzymes: No results for input(s): CKTOTAL, CKMB, CKMBINDEX, TROPONINI in the last 168 hours. BNP (last 3 results) No results for input(s): PROBNP in the last 8760 hours. HbA1C: No results for input(s): HGBA1C in the last 72 hours. CBG: Recent Labs  Lab 05/19/17 1624 05/19/17 1707 05/19/17 2106 05/20/17 0749 05/20/17 1203  GLUCAP 118* 180* 147* 149* 211*   Lipid Profile: No results for input(s): CHOL, HDL, LDLCALC, TRIG, CHOLHDL, LDLDIRECT in the last 72 hours. Thyroid Function Tests: No results for input(s): TSH, T4TOTAL, FREET4, T3FREE, THYROIDAB in the last 72 hours. Anemia Panel: No results for input(s): VITAMINB12, FOLATE, FERRITIN, TIBC, IRON, RETICCTPCT in the last 72 hours.    Radiology Studies: I have reviewed all of the imaging during this hospital visit personally     Scheduled Meds: . amitriptyline  50 mg Oral QHS  . amLODipine  5 mg Oral Daily  . amphetamine-dextroamphetamine  20 mg Oral Daily  . busPIRone  15 mg Oral BID  . citalopram  40 mg Oral Daily  . enoxaparin (LOVENOX) injection  40 mg Subcutaneous Q24H  . insulin aspart  0-9 Units Subcutaneous TID WC  . lamoTRIgine  200 mg Oral BID  . metoprolol tartrate  12.5 mg Oral BID  . pantoprazole  40 mg Oral Q0600  . pregabalin  200 mg Oral BID   Continuous Infusions: . methylPREDNISolone (SOLU-MEDROL) injection Stopped (05/20/17 0826)     LOS: 0 days        Mauricio Gerome Apley, MD [facility] Pager [phone]

## 2017-05-20 NOTE — Progress Notes (Signed)
Patient ID: Dawn Peters, female   DOB: 1980-05-05, 37 y.o.   MRN: [id number]    Subjective: Pt without new complaints.  Objective: Vital signs in last 24 hours: Temp:  [97.8 F (36.6 C)-98.4 F (36.9 C)] 97.8 F (36.6 C) (03/16 0543) Pulse Rate:  [98-109] 98 (03/16 0543) Resp:  [18] 18 (03/16 0543) BP: (121-147)/(58-98) 121/86 (03/16 0543) SpO2:  [98 %-100 %] 99 % (03/16 0543)  Intake/Output from previous day: 03/15 0701 - 03/16 0700 In: 360 [P.O.:360] Out: -  Intake/Output this shift: No intake/output data recorded.  Physical Exam:  General: Alert and oriented Abdomen: No CVAT  Lab Results: Recent Labs    05/19/17 0336  HGB 13.5  HCT 39.0   BMET Recent Labs    05/17/17 1242 05/19/17 0336  NA 142 141  K 4.1 3.4*  CL 99 102  CO2 23 27  GLUCOSE 104* 97  BUN 28* 30*  CREATININE 1.70* 1.60*  CALCIUM 9.6 9.5     Studies/Results:   Assessment/Plan: Left hydroureteronephrosis with neurogenic bladder - No indication for acute intervention. - Will arrange outpatient follow up for imaging and urodynamics with Dr. Louis Meckel.   LOS: 0 days   Haile Toppins,LES 05/20/2017, 11:32 AM

## 2017-05-21 DIAGNOSIS — G35 Multiple sclerosis: Secondary | ICD-10-CM | POA: Diagnosis not present

## 2017-05-21 LAB — GLUCOSE, CAPILLARY
GLUCOSE-CAPILLARY: 114 mg/dL — AB (ref 65–99)
GLUCOSE-CAPILLARY: 204 mg/dL — AB (ref 65–99)
Glucose-Capillary: 210 mg/dL — ABNORMAL HIGH (ref 65–99)

## 2017-05-21 LAB — BASIC METABOLIC PANEL
ANION GAP: 10 (ref 5–15)
BUN: 25 mg/dL — ABNORMAL HIGH (ref 6–20)
CALCIUM: 8.9 mg/dL (ref 8.9–10.3)
CHLORIDE: 102 mmol/L (ref 101–111)
CO2: 28 mmol/L (ref 22–32)
CREATININE: 1.39 mg/dL — AB (ref 0.44–1.00)
GFR calc non Af Amer: 47 mL/min — ABNORMAL LOW (ref >60)
GFR, EST AFRICAN AMERICAN: 55 mL/min — AB (ref >60)
Glucose, Bld: 100 mg/dL — ABNORMAL HIGH (ref 65–99)
Potassium: 3.4 mmol/L — ABNORMAL LOW (ref 3.5–5.1)
SODIUM: 140 mmol/L (ref 135–145)

## 2017-05-21 MED ORDER — AMLODIPINE BESYLATE 5 MG PO TABS: 5.0000 mg | ORAL_TABLET | Freq: Every day | ORAL | 0 refills | Status: AC

## 2017-05-21 MED ORDER — METOPROLOL TARTRATE 25 MG PO TABS: 12.5000 mg | ORAL_TABLET | Freq: Two times a day (BID) | ORAL | 0 refills | Status: DC

## 2017-05-21 NOTE — Care Management Note (Signed)
Case Management Note  Patient Details  Name: Dawn Peters MRN: [id number] Date of Birth: 03-Aug-1980  Subjective/Objective:    MS exac, AKI                Action/Plan: NCM spoke to pt and states she is active with [facility] for HHPT. Has RW at home. Pt lives at home with boyfriend and able to assist as needed. [facility] aware of scheduled dc home today.    Expected Discharge Date:  05/21/17               Expected Discharge Plan:  [place]  In-House Referral:  NA  Discharge planning Services  CM Consult  Post Acute Care Choice:  Home Health, Resumption of Svcs/PTA Provider Choice offered to:  Patient  DME Arranged:  N/A DME Agency:  NA  HH Arranged:  PT [place] Agency:  [place]  Status of Service:  Completed, signed off  If discussed at [place] of Stay Meetings, dates discussed:    Additional Comments:  Erenest Rasher, RN 05/21/2017, 3:55 PM

## 2017-05-21 NOTE — Evaluation (Signed)
Physical Therapy Evaluation Patient Details Name: Dawn Peters MRN: [id number] DOB: 1980/09/29 Today's Date: 05/21/2017   History of Present Illness  37 yo female admitted with Mx exac vs pseudo exac, falls. Hx of MS, residual L LE weakness, metabolic encephalopathy.     Clinical Impression  On eval, pt required Min assist for mobility. She walked ~200 feet x 1 then another ~100 feet x 1 with a seated break in between walks. She remains very unsteady. Pt presents with impaired gait and balance. She appears very anxious and jittery. She dropped her shoe x 5 while trying to don it. She remains at risk for falls even with RW use currently. Discussed d/c plan-she plans to return home. Pt stated her husband can assist her as needed. Based on current presentation, unsure if pt is safe to d/c home at this time. Will continue to follow and progress activity as tolerated.     Follow Up Recommendations Home health PT;Supervision/Assistance - 24 hour    Equipment Recommendations  None recommended by PT    Recommendations for Other Services       Precautions / Restrictions Precautions Precautions: Fall Restrictions Weight Bearing Restrictions: No      Mobility  Bed Mobility Overal bed mobility: Modified Independent                Transfers Overall transfer level: Needs assistance Equipment used: Rolling walker (2 wheeled) Transfers: Sit to/from Stand Sit to Stand: Min guard         General transfer comment: close guard for safety.   Ambulation/Gait Ambulation/Gait assistance: Min assist Ambulation Distance (Feet): 200 Feet(200'x1, 100'x1) Assistive device: Rolling walker (2 wheeled) Gait Pattern/deviations: Decreased dorsiflexion - left;Step-through pattern     General Gait Details: Unsteady. Pt remains at risk for falls, even when using RW. Flexed trunk and knee posturing noted. Pt able to correct when cued. Pt requested to walk with shoes 2nd time-gait appeared  worse.   Stairs            Wheelchair Mobility    Modified Rankin (Stroke Patients Only)       Balance Overall balance assessment: Needs assistance;History of Falls         Standing balance support: Bilateral upper extremity supported Standing balance-Leahy Scale: Poor                               Pertinent Vitals/Pain Pain Assessment: No/denies pain    Home Living Family/patient expects to be discharged to:: Private residence Living Arrangements: Spouse/significant other;Children Available Help at Discharge: Family Type of Home: Apartment Home Access: Stairs to enter Entrance Stairs-Rails: Chemical engineer of Steps: flight Home Layout: Two level;Full bath on main level Home Equipment: Walker - 4 wheels;Cane - single point      Prior Function Level of Independence: Independent               Hand Dominance        Extremity/Trunk Assessment   Upper Extremity Assessment Upper Extremity Assessment: (shaky/jittery)    Lower Extremity Assessment Lower Extremity Assessment: LLE deficits/detail LLE Deficits / Details: residual weakness. Hx of foot drop. Has AFO that she wears (not present in hospital)    Cervical / Trunk Assessment Cervical / Trunk Assessment: Normal  Communication   Communication: No difficulties  Cognition Arousal/Alertness: Awake/alert Behavior During Therapy: Anxious  General Comments: pt was very anxious and jittery.      General Comments      Exercises     Assessment/Plan    PT Assessment Patient needs continued PT services  PT Problem List Decreased balance;Decreased strength;Decreased mobility;Decreased activity tolerance;Decreased knowledge of use of DME;Decreased safety awareness       PT Treatment Interventions DME instruction;Gait training;Functional mobility training;Therapeutic activities;Balance training;Patient/family  education;Therapeutic exercise;Stair training    PT Goals (Current goals can be found in the Care Plan section)  Acute Rehab PT Goals Patient Stated Goal: improved mobility PT Goal Formulation: With patient Time For Goal Achievement: 06/04/17 Potential to Achieve Goals: Good    Frequency Min 3X/week   Barriers to discharge        Co-evaluation               AM-PAC PT "6 Clicks" Daily Activity  Outcome Measure Difficulty turning over in bed (including adjusting bedclothes, sheets and blankets)?: None Difficulty moving from lying on back to sitting on the side of the bed? : None Difficulty sitting down on and standing up from a chair with arms (e.g., wheelchair, bedside commode, etc,.)?: A Little Help needed moving to and from a bed to chair (including a wheelchair)?: A Little Help needed walking in hospital room?: A Little Help needed climbing 3-5 steps with a railing? : A Lot 6 Click Score: 19    End of Session Equipment Utilized During Treatment: Gait belt Activity Tolerance: Patient tolerated treatment well Patient left: in bed;with call bell/phone within reach;with bed alarm set   PT Visit Diagnosis: Muscle weakness (generalized) (M62.81);Difficulty in walking, not elsewhere classified (R26.2);Other abnormalities of gait and mobility (R26.89);History of falling (Z91.81);Repeated falls (R29.6);Unsteadiness on feet (R26.81)    Time: 1430-1455 PT Time Calculation (min) (ACUTE ONLY): 25 min   Charges:   PT Evaluation $PT Eval Moderate Complexity: 1 Mod PT Treatments $Gait Training: 8-22 mins   PT G Codes:          Weston Anna, MPT Pager: [phone]

## 2017-05-21 NOTE — Discharge Summary (Signed)
Physician Discharge Summary  Dawn Peters NID:[id number] DOB: [birth year] DOA: 05/19/2017  PCP: Armanda Heritage, NP  Admit date: 05/19/2017 Discharge date: 05/21/2017  Admitted From: Home Disposition:  Home  Recommendations for Outpatient Follow-up and new medication changes:  1. Follow up with PCP in 1- weeks 2. Follow with Dr. Felecia Shelling as outpatient  Home Health: no  Equipment/Devices: no    Discharge Condition: stable CODE STATUS: full  Diet recommendation: Heart healthy diet  Brief/Interim Summary: 37 year old female who presented with left-sided weakness.  She does have the significant past medical history for anxiety, depression, hypertension and multiple sclerosis. Presented with worsening weakness for the last 7 days prior to hospitalization.  On the initial physical examination blood pressure 142/101, heart rate 91, respiratory rate 18, temperature 98.3, oxygen saturation 94%.  Lungs clear to auscultation bilaterally, heart S1-S2 present rhythmic, the abdomen was soft nontender, no lower extremity edema.  Noted left lower extremity weakness.  Patient was admitted with a working diagnosis of multiple sclerosis exacerbation, to rule out pseudo-exacerbation.  1.  Multiple sclerosis exacerbation/pseudo-exacerbation.  Patient was admitted to the medical unit, she received supportive medical therapy, she was seen by neurology as consultation, she was placed on methylprednisolone 1 g daily for 3 doses.  Continue to have neuro checks.  Clinically her weakness improved, patient will be discharged home after physical therapy evaluation.  Further workup with MRI of the cervical and thoracic spine show no new or progressive findings.  2.  Acute kidney injury on chronic kidney disease stage 3.  Patient received IV fluids, improvement of kidney function, discharge creatinine 1.39, calculated GFR 59.  Continue blood pressure monitoring, follow-up as an outpatient. Hold on Naproxen.   3.   Uncontrolled hypertension.  Patient was resumed on metoprolol and amlodipine with improvement of blood pressure, at discharge systolic blood pressure 144-315 mmHg.   4.  Steroid-induced hyperglycemia.  Patient was placed on insulin sliding scale, capillary glucose monitoring.  Hemoglobin A1c 5.3.  Discharge fasting glucose 100.   5.  Left hydroureteronephrosis with neurogenic bladder.  Patient was seen by urology, no indication for acute intervention, patient will have outpatient follow-up for imaging and urodynamics with Dr. Louis Meckel.  Noted improvement of kidney function.  6.  Depression.  Patient was continued on trazodone, lamotrigine, citalopram, buspirone and amitriptyline.  No confusion or agitation.  Discharge Diagnoses:  Active Problems:   Essential hypertension   Multiple sclerosis exacerbation (HCC)   Left leg weakness   Mild renal insufficiency   Elevated serum creatinine    Discharge Instructions   Allergies as of 05/21/2017      Reactions   Ace Inhibitors Swelling   Lisinopril Swelling   Lower lip swelling      Medication List    STOP taking these medications   acetaminophen 325 MG tablet Commonly known as:  TYLENOL   dalfampridine 10 MG Tb12   gabapentin 800 MG tablet Commonly known as:  NEURONTIN   naproxen 375 MG tablet Commonly known as:  NAPROSYN   TRI-LO-MARZIA 0.18/0.215/0.25 MG-25 MCG tab Generic drug:  Norgestimate-Ethinyl Estradiol Triphasic     TAKE these medications   amitriptyline 25 MG tablet Commonly known as:  ELAVIL TAKE ONE OR TWO AT BEDTIME What changed:  See the new instructions.   amLODipine 5 MG tablet Commonly known as:  NORVASC Take 1 tablet (5 mg total) by mouth daily.   amphetamine-dextroamphetamine 20 MG 24 hr capsule Commonly known as:  ADDERALL XR Take 1 capsule (20 mg total)  by mouth daily.   baclofen 20 MG tablet Commonly known as:  LIORESAL Take 1 tablet (20 mg total) by mouth 3 (three) times daily as needed  (for ms). What changed:  when to take this   busPIRone 15 MG tablet Commonly known as:  BUSPAR Take 1 tablet (15 mg total) by mouth 2 (two) times daily.   citalopram 40 MG tablet Commonly known as:  CELEXA Take 1 tablet (40 mg total) by mouth daily.   lamoTRIgine 200 MG tablet Commonly known as:  LAMICTAL Take 1 tablet (200 mg total) by mouth 2 (two) times daily.   metoprolol tartrate 25 MG tablet Commonly known as:  LOPRESSOR Take 0.5 tablets (12.5 mg total) by mouth 2 (two) times daily.   Oxcarbazepine 300 MG tablet Commonly known as:  TRILEPTAL Take 1 tablet (300 mg total) by mouth 2 (two) times daily.   oxyCODONE-acetaminophen 5-325 MG tablet Commonly known as:  PERCOCET/ROXICET Take 1 tablet by mouth 4 (four) times daily.   pregabalin 200 MG capsule Commonly known as:  LYRICA Take 1 capsule (200 mg total) by mouth 2 (two) times daily.   SUMAtriptan 100 MG tablet Commonly known as:  IMITREX Take 1 tablet (100 mg total) by mouth once as needed for migraine. May repeat in 2 hours if headache persists or recurs.   tamsulosin 0.4 MG Caps capsule Commonly known as:  FLOMAX Take 0.4 mg by mouth daily.   tiZANidine 4 MG tablet Commonly known as:  ZANAFLEX TAKE 1 TABLET BY MOUTH AT BEDTIME   valACYclovir 500 MG tablet Commonly known as:  VALTREX Take 1 tablet (500 mg total) by mouth 2 (two) times daily.       Allergies  Allergen Reactions  . Ace Inhibitors Swelling  . Lisinopril Swelling    Lower lip swelling    Consultations:  Neurology    Procedures/Studies: Ct Abdomen Pelvis Wo Contrast  Result Date: 05/19/2017 CLINICAL DATA:  Hydronephrosis. Multiple sclerosis with recent flare. Stage II renal failure. EXAM: CT ABDOMEN AND PELVIS WITHOUT CONTRAST TECHNIQUE: Multidetector CT imaging of the abdomen and pelvis was performed following the standard protocol without IV contrast. COMPARISON:  Renal ultrasound 04/09/2010. FINDINGS: Lower chest: Minimal  atelectasis is present at the left lung base. The lung bases are otherwise clear. The heart size is normal. No significant pleural or pericardial effusion is present. Hepatobiliary: A hypodense lesion at the dome of the right lobe of the liver on image 11 of series 2 measures 8 mm. At least 2 additional subcentimeter lesions are present in the right lobe. A hypodense lesion posteriorly in the right lobe is well-defined, measuring 12 x 12 x 13 mm. Pancreas: Unremarkable. No pancreatic ductal dilatation or surrounding inflammatory changes. Spleen: Normal in size without focal abnormality. Adrenals/Urinary Tract: The right adrenal gland scratched at the adrenal glands are normal bilaterally. The right kidney in ureter is within normal limits. Severe left-sided hydronephrosis is present. There is marked tortuosity of the left ureter throughout its course in the abdomen. There is focal stenosis at the pelvic inlet in the region of the left adnexa. Stomach/Bowel: Stomach and duodenum are within normal limits. Small bowel is normal. Terminal ileum is within normal limits. The appendix is visualized and normal. The ascending and transverse colon are within normal limits. The descending and sigmoid colon are normal. Vascular/Lymphatic: No significant vascular findings are present. No enlarged abdominal or pelvic lymph nodes. Reproductive: The uterus is heterogeneous and somewhat irregular, suggesting fibroids. Adnexa are within normal  limits. Other: No abdominal wall hernia or abnormality. No abdominopelvic ascites. Musculoskeletal: Vertebral body heights and alignment are normal. The bony pelvis is intact. 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 Hips are located. IMPRESSION: 1. Severe left-sided hydronephrosis. 2. The left ureter is dilated to the pelvic inlet. No definite obstructing lesion is present. Recommend urology consultation for further evaluation. This is a nonemergent process. 3. Hypodense lesions of the liver  likely represent benign hepatic cysts. Consider MRI of the liver without and with contrast for definitive characterization. Electronically Signed   By: San Morelle M.D.   On: 05/19/2017 16:39   Mr Cervical Spine W Wo Contrast  Result Date: 05/19/2017 CLINICAL DATA:  History of multiple sclerosis. Unable to walk. Left leg weakness and numbness. EXAM: MRI CERVICAL AND thoracic SPINE WITHOUT AND WITH CONTRAST TECHNIQUE: Multiplanar and multiecho pulse sequences of the cervical spine, to include the craniocervical junction and cervicothoracic junction, and thoracic spine, were obtained without and with intravenous contrast. CONTRAST:  74mL MULTIHANCE GADOBENATE DIMEGLUMINE 529 MG/ML IV SOLN COMPARISON:  08/13/2016 FINDINGS: MRI CERVICAL SPINE FINDINGS Alignment: Normal Vertebrae: Normal Cord: Chronic cord atrophy with low level T2 signal in the diffuse central cord, similar to the previous exams. No sign of new focal involvement. No abnormal contrast enhancement. Posterior Fossa, vertebral arteries, paraspinal tissues: See results of previous brain study. Disc levels: No degenerative disc disease. No facet arthropathy. No stenosis or neural compression. MRI thoracic SPINE FINDINGS Alignment:  Normal Vertebrae:  No fracture or primary bone lesion. Cord: No focal cord lesion. Mild generalized cord atrophy. Question low level T2 signal as seen previously that could represent chronic demyelinating change. No abnormal contrast enhancement. Paraspinal and other soft tissues: Chronic left hydronephrosis. Disc levels: No significant degenerative changes. No stenosis of the canal or foramina. IMPRESSION: No new or progressive finding. This patient appears to have some atrophic change of the cervical and thoracic cord and as seen previously, has very low level fairly homogeneous T2 signal in the cord best shown on the gradient echo imaging. No focal contrast enhancement or cord swelling. Left hydroureteronephrosis as  previously seen. Has this been evaluated? Electronically Signed   By: Nelson Chimes M.D.   On: 05/19/2017 13:09   Mr Thoracic Spine W Wo Contrast  Result Date: 05/19/2017 CLINICAL DATA:  History of multiple sclerosis. Unable to walk. Left leg weakness and numbness. EXAM: MRI CERVICAL AND thoracic SPINE WITHOUT AND WITH CONTRAST TECHNIQUE: Multiplanar and multiecho pulse sequences of the cervical spine, to include the craniocervical junction and cervicothoracic junction, and thoracic spine, were obtained without and with intravenous contrast. CONTRAST:  51mL MULTIHANCE GADOBENATE DIMEGLUMINE 529 MG/ML IV SOLN COMPARISON:  08/13/2016 FINDINGS: MRI CERVICAL SPINE FINDINGS Alignment: Normal Vertebrae: Normal Cord: Chronic cord atrophy with low level T2 signal in the diffuse central cord, similar to the previous exams. No sign of new focal involvement. No abnormal contrast enhancement. Posterior Fossa, vertebral arteries, paraspinal tissues: See results of previous brain study. Disc levels: No degenerative disc disease. No facet arthropathy. No stenosis or neural compression. MRI thoracic SPINE FINDINGS Alignment:  Normal Vertebrae:  No fracture or primary bone lesion. Cord: No focal cord lesion. Mild generalized cord atrophy. Question low level T2 signal as seen previously that could represent chronic demyelinating change. No abnormal contrast enhancement. Paraspinal and other soft tissues: Chronic left hydronephrosis. Disc levels: No significant degenerative changes. No stenosis of the canal or foramina. IMPRESSION: No new or progressive finding. This patient appears to have some atrophic change of  the cervical and thoracic cord and as seen previously, has very low level fairly homogeneous T2 signal in the cord best shown on the gradient echo imaging. No focal contrast enhancement or cord swelling. Left hydroureteronephrosis as previously seen. Has this been evaluated? Electronically Signed   By: Nelson Chimes M.D.    On: 05/19/2017 13:09   Dg Knee Complete 4 Views Left  Result Date: 05/19/2017 CLINICAL DATA:  Pain and swelling EXAM: LEFT KNEE - COMPLETE 4+ VIEW COMPARISON:  April 13, 2015 FINDINGS: Frontal, lateral, and bilateral oblique views were obtained. No evident fracture or dislocation. No joint effusion. There is no appreciable joint space narrowing. There is a small osteochondral defect along the lateral distal femoral condyle. No frank erosion. IMPRESSION: No fracture or joint effusion. 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 No loose body. No frank erosion. No appreciable joint space narrowing. Electronically Signed   By: Lowella Grip III M.D.   On: 05/19/2017 17:06       Subjective: Patient feeling better, strength improved, mild anxiety, no nausea, no vomiting, no dyspnea or chest pain.  Discharge Exam: Vitals:   05/20/17 2041 05/21/17 0548  BP: (!) 136/99 (!) 127/99  Pulse: (!) 104 91  Resp: 18 18  Temp: 98.3 F (36.8 C) 97.8 F (36.6 C)  SpO2: 100% 99%   Vitals:   05/20/17 0543 05/20/17 1400 05/20/17 2041 05/21/17 0548  BP: 121/86 139/90 (!) 136/99 (!) 127/99  Pulse: 98 (!) 110 (!) 104 91  Resp: 18 18 18 18   Temp: 97.8 F (36.6 C) 98.3 F (36.8 C) 98.3 F (36.8 C) 97.8 F (36.6 C)  TempSrc: Oral Oral Oral Oral  SpO2: 99% 100% 100% 99%  Weight:      Height:        General: Not in pain or dyspnea Neurology: Awake and alert, mild tremors, no noted focal weakness E ENT: no pallor, no icterus, oral mucosa moist Cardiovascular: No JVD. S1-S2 present, rhythmic, no gallops, rubs, or murmurs. No lower extremity edema. Pulmonary: vesicular breath sounds bilaterally, adequate air movement, no wheezing, rhonchi or rales. Gastrointestinal. Abdomen with no organomegaly, non tender, no rebound or guarding Skin. No rashes Musculoskeletal: no joint deformities   The results of significant diagnostics from  this hospitalization (including imaging, microbiology, ancillary and laboratory) are listed below for reference.     Microbiology: No results found for this or any previous visit (from the past 240 hour(s)).   Labs: BNP (last 3 results) No results for input(s): BNP in the last 8760 hours. Basic Metabolic Panel: Recent Labs  Lab 05/17/17 1242 05/19/17 0336 05/21/17 0741  NA 142 141 140  K 4.1 3.4* 3.4*  CL 99 102 102  CO2 23 27 28   GLUCOSE 104* 97 100*  BUN 28* 30* 25*  CREATININE 1.70* 1.60* 1.39*  CALCIUM 9.6 9.5 8.9   Liver Function Tests: Recent Labs  Lab 05/19/17 0336  AST 20  ALT 20  ALKPHOS 83  BILITOT 0.5  PROT 7.6  ALBUMIN 4.1   No results for input(s): LIPASE, AMYLASE in the last 168 hours. No results for input(s): AMMONIA in the last 168 hours. CBC: Recent Labs  Lab 05/19/17 0336  WBC 6.8  NEUTROABS 5.4  HGB 13.5  HCT 39.0  MCV 95.8  PLT 301   Cardiac Enzymes: No results for input(s): CKTOTAL, CKMB, CKMBINDEX, TROPONINI in the last 168 hours. BNP: Invalid input(s): POCBNP CBG: Recent Labs  Lab 05/20/17  1655 05/20/17 1743 05/20/17 2039 05/21/17 0828 05/21/17 1205  GLUCAP 161* 186* 191* 114* 210*   D-Dimer No results for input(s): DDIMER in the last 72 hours. Hgb A1c No results for input(s): HGBA1C in the last 72 hours. Lipid Profile No results for input(s): CHOL, HDL, LDLCALC, TRIG, CHOLHDL, LDLDIRECT in the last 72 hours. Thyroid function studies No results for input(s): TSH, T4TOTAL, T3FREE, THYROIDAB in the last 72 hours.  Invalid input(s): FREET3 Anemia work up No results for input(s): VITAMINB12, FOLATE, FERRITIN, TIBC, IRON, RETICCTPCT in the last 72 hours. Urinalysis    Component Value Date/Time   COLORURINE YELLOW 05/19/2017 0211   APPEARANCEUR CLEAR 05/19/2017 0211   APPEARANCEUR Clear 04/19/2016 1419   LABSPEC 1.020 05/19/2017 0211   PHURINE 6.0 05/19/2017 0211   GLUCOSEU NEGATIVE 05/19/2017 0211   HGBUR NEGATIVE  05/19/2017 0211   BILIRUBINUR NEGATIVE 05/19/2017 0211   BILIRUBINUR Negative 04/19/2016 1419   KETONESUR NEGATIVE 05/19/2017 0211   PROTEINUR NEGATIVE 05/19/2017 0211   UROBILINOGEN 0.2 12/15/2016 1136   NITRITE NEGATIVE 05/19/2017 0211   LEUKOCYTESUR NEGATIVE 05/19/2017 0211   LEUKOCYTESUR Negative 04/19/2016 1419   Sepsis Labs Invalid input(s): PROCALCITONIN,  WBC,  LACTICIDVEN Microbiology No results found for this or any previous visit (from the past 240 hour(s)).   Time coordinating discharge: 45 minutes  SIGNED:   Tawni Millers, MD  [facility] 05/21/2017, 12:48 PM Pager [phone]  If 7PM-7AM, please contact night-coverage [email or web address] Password [email or web address]

## 2017-05-22 DIAGNOSIS — N289 Disorder of kidney and ureter, unspecified: Secondary | ICD-10-CM | POA: Diagnosis not present

## 2017-05-22 DIAGNOSIS — R29898 Other symptoms and signs involving the musculoskeletal system: Secondary | ICD-10-CM | POA: Diagnosis not present

## 2017-05-22 DIAGNOSIS — R7989 Other specified abnormal findings of blood chemistry: Secondary | ICD-10-CM

## 2017-05-22 DIAGNOSIS — G35 Multiple sclerosis: Secondary | ICD-10-CM | POA: Diagnosis not present

## 2017-05-22 LAB — GLUCOSE, CAPILLARY
GLUCOSE-CAPILLARY: 106 mg/dL — AB (ref 65–99)
Glucose-Capillary: 178 mg/dL — ABNORMAL HIGH (ref 65–99)
Glucose-Capillary: 96 mg/dL (ref 65–99)
Glucose-Capillary: 185 mg/dL — ABNORMAL HIGH (ref 65–99)
Glucose-Capillary: 145 mg/dL — ABNORMAL HIGH (ref 65–99)

## 2017-05-22 NOTE — Telephone Encounter (Signed)
Pt called she is still in the hospital and unable to walk without assistance of a walker. She said the hospitallists advised she get lemtrada d/c and get started on another medication. She is wanting to discuss with Faith, she is aware RN is out of the office today, pt would like to wait until RN returns tomorrow to discuss. She can be reached at [phone]

## 2017-05-22 NOTE — Clinical Social Work Note (Signed)
Clinical Social Work Assessment  Patient Details  Name: Dawn Peters MRN: [id number] Date of Birth: [birth year]  Date of referral:  05/22/17               Reason for consult:  Facility Placement                Permission sought to share information with:  Family Supports, Customer service manager, Case Optician, dispensing granted to share information::  Yes, Verbal Permission Granted  Name::     [name]  Agency::  SNF  Relationship::  Mother, Son  Sport and exercise psychologist Information:     Housing/Transportation Living arrangements for the past 2 months:  Apartment Source of Information:  Patient Patient Interpreter Needed:  None Criminal Activity/Legal Involvement Pertinent to Current Situation/Hospitalization:  No - Comment as needed Significant Relationships:  Adult Children, Friend, Parents Lives with:  Adult Children Do you feel safe going back to the place where you live?  Yes Need for family participation in patient care:  Yes (Comment)  Care giving concerns:  No care giving concerns at the time of assessment.    Social Worker assessment / plan:  LCSW following for facility placement.   LCSW admitted for weakness.  LCSW met with patient at beside. Patient's friend is present.   LCSW explained role and reason for visit.   Patient reports that prior to admission she was independent in all ADLs. Patient reports that she drives. Patient has stairs leading to her home and stairs to the second story. Patient's adult son, Tessie Fass lives with her and is a support to her.   Patient is agreeable to SNF at dc.   PLAN: SNF at dc.   Employment status:  Disabled (Comment on whether or not currently receiving Disability) Insurance information:  Managed Medicare PT Recommendations:  [place] / Referral to community resources:  [place]  Patient/Family's Response to care:  Patient is thankful for LCSW visit. Patient expressed that she is  scared and nervous to go to SNF. LCSW affirmed patients feelings. Patient asked questions about SNF process. LCSW asked questions.   Patient/Family's Understanding of and Emotional Response to Diagnosis, Current Treatment, and Prognosis:  Patient is understanding of her diagnosis and is agreeable to SNF at dc. Patient expressed her feelings toward SNF but stated she understands that she needs to get stronger before going home.   Emotional Assessment Appearance:  Appears stated age Attitude/Demeanor/Rapport:    Affect (typically observed):  Pleasant, Overwhelmed Orientation:  Oriented to Self, Oriented to Place, Oriented to  Time, Oriented to Situation Alcohol / Substance use:  Not Applicable Psych involvement (Current and /or in the community):  No (Comment)  Discharge Needs  Concerns to be addressed:  No discharge needs identified Readmission within the last 30 days:  No Current discharge risk:  None Barriers to Discharge:  Continued Medical Work up, No SNF bed   Servando Snare, LCSW 05/22/2017, 4:42 PM

## 2017-05-22 NOTE — Progress Notes (Signed)
Physical Therapy Treatment Patient Details Name: Dawn Peters MRN: [id number] DOB: [birth year] Today's Date: 05/22/2017    History of Present Illness 37 yo female admitted with Mx exac vs pseudo exac, falls. Hx of MS, residual L LE weakness, metabolic encephalopathy.     PT Comments    Pt still present with gross motor ataxic mvmts all extremities.  Sitting on EOB, pt was unable to don her own shoes.  Dropped her shoe three times and nearly fell OOB reaching for it.  Even sitting EOB pt is in constant motion.  Attempted to amb to bathroom without any AD as she stated she could do before hospital admit.  VERY unsteady spastic, ataxic, gross motor mvts resulting a VERY unsteady gait.  + 2 assist to prevent falling.  Pt unable to doff underwear to use bathroom without falling.  Increased frustration, anxiety and worry by pt.  Required assist to rise from toilet and required assist to stand and wash hands at sink.  Even standing, pt was in constant motion.  Amb in hallway with walker required + 2 assist for safety.  Reported to RN, pt is not safe to D/C to home.  Consulted LPT pt performance at today's session.  Pt may need ST Rehab at SNF.  Follow Up Recommendations    SNF   Equipment Recommendations  None recommended by PT    Recommendations for Other Services       Precautions / Restrictions Precautions Precautions: Fall Precaution Comments: Hx MS and L foot drop wears AFO Restrictions Weight Bearing Restrictions: No    Mobility  Bed Mobility Overal bed mobility: Needs Assistance Bed Mobility: Supine to Sit;Sit to Supine     Supine to sit: Supervision;Min guard Sit to supine: Supervision;Min guard   General bed mobility comments: very ataxic throughout with poor motor control to perform task on her own safely.  Transfers Overall transfer level: Needs assistance Equipment used: Rolling walker (2 wheeled) Transfers: Sit to/from [facility] Sit to Stand: Min  assist;Mod assist Stand pivot transfers: Min assist;Mod assist       General transfer comment: very ataxic throughout with poor motor control.  Near fall in bathroom when attempted to pull down underware.  Unable to maintain safe static balance.  Irregular spastic mvts and poor self control to cease.  B knees buckle without warning.  Poor controlled mvts even with UE's    Spastic  Ambulation/Gait Ambulation/Gait assistance: Min assist;Mod assist Ambulation Distance (Feet): 35 Feet Assistive device: Rolling walker (2 wheeled) Gait Pattern/deviations: Decreased dorsiflexion - left;Step-through pattern Gait velocity: decreased   General Gait Details: VERY unsteady ataxic, spastic gait with poor self control.  100% FALL RISK.  Amb without walker to bathroom as pt stated she was able to do before admit.  Required + 2 assist to prevent falling.     Stairs            Wheelchair Mobility    Modified Rankin (Stroke Patients Only)       Balance                                            Cognition Arousal/Alertness: Awake/alert Behavior During Therapy: WFL for tasks assessed/performed  General Comments: pt was very anxious and jittery.      Exercises      General Comments        Pertinent Vitals/Pain Pain Assessment: No/denies pain    Home Living                      Prior Function            PT Goals (current goals can now be found in the care plan section) Progress towards PT goals: Progressing toward goals    Frequency    Min 3X/week      PT Plan      Co-evaluation              AM-PAC PT "6 Clicks" Daily Activity  Outcome Measure  Difficulty turning over in bed (including adjusting bedclothes, sheets and blankets)?: A Lot Difficulty moving from lying on back to sitting on the side of the bed? : A Lot Difficulty sitting down on and standing up from a chair with arms  (e.g., wheelchair, bedside commode, etc,.)?: A Lot Help needed moving to and from a bed to chair (including a wheelchair)?: A Lot Help needed walking in hospital room?: A Lot Help needed climbing 3-5 steps with a railing? : A Lot 6 Click Score: 12    End of Session Equipment Utilized During Treatment: Gait belt Activity Tolerance: Other (comment)(unable to safely amb) Patient left: in bed;with call bell/phone within reach;with bed alarm set Nurse Communication: Mobility status PT Visit Diagnosis: Muscle weakness (generalized) (M62.81);Difficulty in walking, not elsewhere classified (R26.2);Other abnormalities of gait and mobility (R26.89);History of falling (Z91.81);Repeated falls (R29.6);Unsteadiness on feet (R26.81)     Time: 3845-3646 PT Time Calculation (min) (ACUTE ONLY): 25 min  Charges:  $Gait Training: 8-22 mins $Therapeutic Activity: 8-22 mins                    G Codes:       Rica Koyanagi  PTA [facility]  Acute  Rehab Pager      [phone]

## 2017-05-22 NOTE — Progress Notes (Addendum)
PROGRESS NOTE    Dawn Peters  KYH:[id number] DOB: Feb 14, 1981 DOA: 05/19/2017 PCP: Armanda Heritage, NP    Brief Narrative:  37 year old female who presented with left-sided weakness.  She does have the significant past medical history for anxiety, depression, hypertension and multiple sclerosis.  Presented with worsening weakness for the last 7 days prior to hospitalization.  On the initial physical examination blood pressure 142/101, heart rate 91, respiratory 18, temperature 98.3, oxygen saturation 94%.  Lungs clear to auscultation bilaterally, heart S1-S2 present rhythmic, the abdomen was soft nontender, no lower extremity edema.  Noted left lower extremity weakness.  Patient was admitted with a working diagnosis of multiple sclerosis exacerbation, to rule out pseudo-exacerbation.   Assessment & Plan:   Active Problems:   Essential hypertension   Multiple sclerosis exacerbation (HCC)   Left leg weakness   Mild renal insufficiency   Elevated serum creatinine   1.  Multiple sclerosis exacerbation. Continue to have significant dis-balance on ambulation, physical therapy has recommended SNF, I spoke with neurology over the phone, recommended to continue physical therapy, not likely of persistent symptoms due to MS, will need prompt follow up with neurology as outpatient.  2.  Chronic kidney disease 3. Tolerating po well, avoid hypotension and nephrotoxic medications.  3.  Uncontrolled hypertension. On Amodipine 5 mg daily, blood pressure systolic 151 mmHg.  4. Depression. Continue home regimen with trazodone, lamotrigine, citalopram, buspirone, and amitriptyline.   5. Steroid induced hyperglycemia. Insulin sliding scale for glucose cover and monitoring. Glucose has improved, off systemic steroids. Capillary glucose 204, 178, 96, 186, 106.  DVT prophylaxis: enoxaparin  Code Status: full Family Communication: no family at the bedside Disposition Plan: waiting for snf  placement.    Consultants:   Neurology   Procedures:     Antimicrobials:       Subjective: Patient continue to be very weak, has significant difficulty ambulating, no nausea or vomiting, no dyspnea or chest pain.    Objective: Vitals:   05/21/17 2000 05/21/17 2101 05/22/17 0555 05/22/17 1349  BP:  (!) 145/94 131/87 135/87  Pulse: (!) 120 (!) 102 80 97  Resp:  18 18 20   Temp:  98.3 F (36.8 C) 98.1 F (36.7 C) 98.5 F (36.9 C)  TempSrc:  Oral Oral Oral  SpO2:  97% 99% 100%  Weight:      Height:        Intake/Output Summary (Last 24 hours) at 05/22/2017 1527 Last data filed at 05/22/2017 0800 Gross per 24 hour  Intake 480 ml  Output -  Net 480 ml   Filed Weights   05/19/17 0102  Weight: 86.2 kg (190 lb)    Examination:   General: deconditioned Neurology: Awake and alert, non focal/ positive tremors. Strength is decreased on the left lower extremity.   E ENT: no pallor, no icterus, oral mucosa moist Cardiovascular: No JVD. S1-S2 present, rhythmic, no gallops, rubs, or murmurs. No lower extremity edema. Pulmonary: vesicular breath sounds bilaterally, adequate air movement, no wheezing, rhonchi or rales. Gastrointestinal. Abdomen flat, no organomegaly, non tender, no rebound or guarding Skin. No rashes Musculoskeletal: no joint deformities     Data Reviewed: I have personally reviewed following labs and imaging studies  CBC: Recent Labs  Lab 05/19/17 0336  WBC 6.8  NEUTROABS 5.4  HGB 13.5  HCT 39.0  MCV 95.8  PLT 761   Basic Metabolic Panel: Recent Labs  Lab 05/17/17 1242 05/19/17 0336 05/21/17 0741  NA 142 141 140  K  4.1 3.4* 3.4*  CL 99 102 102  CO2 23 27 28   GLUCOSE 104* 97 100*  BUN 28* 30* 25*  CREATININE 1.70* 1.60* 1.39*  CALCIUM 9.6 9.5 8.9   GFR: Estimated Creatinine Clearance: 59.5 mL/min (A) (by C-G formula based on SCr of 1.39 mg/dL (H)). Liver Function Tests: Recent Labs  Lab 05/19/17 0336  AST 20  ALT 20    ALKPHOS 83  BILITOT 0.5  PROT 7.6  ALBUMIN 4.1   No results for input(s): LIPASE, AMYLASE in the last 168 hours. No results for input(s): AMMONIA in the last 168 hours. Coagulation Profile: No results for input(s): INR, PROTIME in the last 168 hours. Cardiac Enzymes: No results for input(s): CKTOTAL, CKMB, CKMBINDEX, TROPONINI in the last 168 hours. BNP (last 3 results) No results for input(s): PROBNP in the last 8760 hours. HbA1C: No results for input(s): HGBA1C in the last 72 hours. CBG: Recent Labs  Lab 05/21/17 1205 05/21/17 1654 05/21/17 2058 05/22/17 0732 05/22/17 1124  GLUCAP 210* 204* 178* 96 185*   Lipid Profile: No results for input(s): CHOL, HDL, LDLCALC, TRIG, CHOLHDL, LDLDIRECT in the last 72 hours. Thyroid Function Tests: No results for input(s): TSH, T4TOTAL, FREET4, T3FREE, THYROIDAB in the last 72 hours. Anemia Panel: No results for input(s): VITAMINB12, FOLATE, FERRITIN, TIBC, IRON, RETICCTPCT in the last 72 hours.    Radiology Studies: I have reviewed all of the imaging during this hospital visit personally     Scheduled Meds: . amitriptyline  50 mg Oral QHS  . amLODipine  5 mg Oral Daily  . amphetamine-dextroamphetamine  20 mg Oral Daily  . busPIRone  15 mg Oral BID  . citalopram  40 mg Oral Daily  . enoxaparin (LOVENOX) injection  40 mg Subcutaneous Q24H  . insulin aspart  0-9 Units Subcutaneous TID WC  . lamoTRIgine  200 mg Oral BID  . metoprolol tartrate  12.5 mg Oral BID  . pantoprazole  40 mg Oral Q0600  . pregabalin  200 mg Oral BID   Continuous Infusions:   LOS: 0 days        Katelynd Blauvelt Gerome Apley, MD [facility] Pager [phone]

## 2017-05-22 NOTE — NC FL2 (Signed)
[place] MEDICAID FL2 LEVEL OF CARE SCREENING TOOL     IDENTIFICATION  Patient Name: Dawn Peters Birthdate: February 02, 1981 Sex: female Admission Date (Current Location): 05/19/2017  [facility] and [place] Number:  Herbalist and Address:  [facility],  [address], [place]      Provider Number: [id number]  Attending Physician Name and Address:  Tawni Millers  Relative Name and Phone Number:       Current Level of Care: Hospital Recommended Level of Care: [place] Prior Approval Number:    Date Approved/Denied: 05/22/17 PASRR Number: [id number]  Discharge Plan: SNF    Current Diagnoses: Patient Active Problem List   Diagnosis Date Noted  . Elevated serum creatinine 05/12/2017  . Acute encephalopathy 04/20/2017  . Acute kidney injury superimposed on CKD ([place]) 04/20/2017  . Hydronephrosis of left kidney 10/24/2016  . Attention deficit 10/24/2016  . Left leg weakness 08/13/2016  . Chronic pain 08/13/2016  . Mild renal insufficiency 08/13/2016  . Left-sided weakness   . High risk medication use 09/15/2015  . Hip pain, bilateral 07/28/2015  . Urinary hesitancy 07/28/2015  . Multiple sclerosis exacerbation ([place]) 07/17/2015  . Urinary disorder 06/11/2015  . Gait disturbance 05/19/2015  . Numbness 05/19/2015  . Depression with anxiety 05/13/2015  . Other fatigue 05/13/2015  . Left knee pain 05/13/2015  . Avascular necrosis of bones of both hips ([place]) 05/13/2015  . Screening for HIV (human immunodeficiency virus) 07/08/2014  . Essential hypertension, benign 11/19/2013  . Anxiety state, unspecified 11/19/2013  . Screen for STD (sexually transmitted disease) 11/01/2013  . Encounter for routine gynecological examination 11/01/2013  . Oral contraceptive use 10/20/2011  . Multiple sclerosis ([place]) 08/08/2007  . RASH AND OTHER NONSPECIFIC SKIN ERUPTION 06/22/2007  . AVASCULAR NECROSIS, FEMORAL HEAD 03/11/2006   . Essential hypertension 02/03/2006  . 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 DVT, HX OF 02/03/2006    Orientation RESPIRATION BLADDER Height & Weight     Self, Time, Situation, Place  Normal Continent Weight: 190 lb (86.2 kg) Height:  5\' 5"  (165.1 cm)  BEHAVIORAL SYMPTOMS/MOOD NEUROLOGICAL BOWEL NUTRITION STATUS      Continent Diet(See dc summary)  AMBULATORY STATUS COMMUNICATION OF NEEDS Skin   Extensive Assist Verbally Normal                       Personal Care Assistance Level of Assistance  Bathing, Feeding, Dressing Bathing Assistance: Limited assistance Feeding assistance: Independent Dressing Assistance: Limited assistance     Functional Limitations Info  Sight, Hearing, Speech Sight Info: Adequate Hearing Info: Adequate Speech Info: Adequate    SPECIAL CARE FACTORS FREQUENCY  PT (By licensed PT), OT (By licensed OT)     PT Frequency: 5x/week OT Frequency: 5x/week            Contractures Contractures Info: Not present    Additional Factors Info  Code Status, Allergies Code Status Info: Full Allergies Info: Ace Inhibitors, Lisinopril           Current Medications (05/22/2017):  This is the current hospital active medication list Current Facility-Administered Medications  Medication Dose Route Frequency Provider Last Rate Last Dose  . amitriptyline (ELAVIL) tablet 50 mg  50 mg Oral QHS Hosie Poisson, MD   50 mg at 05/21/17 2201  . amLODipine (NORVASC) tablet 5 mg  5 mg Oral Daily Hosie Poisson, MD   5 mg at 05/22/17 0924  . amphetamine-dextroamphetamine (ADDERALL XR) 24 hr capsule 20  mg  20 mg Oral Daily Hosie Poisson, MD   20 mg at 05/22/17 6629  . baclofen (LIORESAL) tablet 20 mg  20 mg Oral TID PRN Hosie Poisson, MD   20 mg at 05/21/17 1628  . busPIRone (BUSPAR) tablet 15 mg  15 mg Oral BID Hosie Poisson, MD   15 mg at 05/22/17 0923  . citalopram (CELEXA) tablet 40 mg  40 mg Oral Daily Hosie Poisson, MD   40 mg at 05/22/17 0923  . enoxaparin  (LOVENOX) injection 40 mg  40 mg Subcutaneous Q24H Green, Terri L, RPH   40 mg at 05/22/17 1226  . hydrALAZINE (APRESOLINE) injection 5 mg  5 mg Intravenous Q4H PRN Hosie Poisson, MD      . insulin aspart (novoLOG) injection 0-9 Units  0-9 Units Subcutaneous TID WC Hosie Poisson, MD   2 Units at 05/22/17 1226  . lamoTRIgine (LAMICTAL) tablet 200 mg  200 mg Oral BID Hosie Poisson, MD   200 mg at 05/22/17 0924  . metoprolol tartrate (LOPRESSOR) tablet 12.5 mg  12.5 mg Oral BID Hosie Poisson, MD   12.5 mg at 05/22/17 0923  . oxyCODONE-acetaminophen (PERCOCET/ROXICET) 5-325 MG per tablet 1 tablet  1 tablet Oral Q8H PRN Hosie Poisson, MD   1 tablet at 05/22/17 0924  . pantoprazole (PROTONIX) EC tablet 40 mg  40 mg Oral Q0600 Hosie Poisson, MD   40 mg at 05/22/17 0713  . pregabalin (LYRICA) capsule 200 mg  200 mg Oral BID Hosie Poisson, MD   200 mg at 05/22/17 4765     Discharge Medications: Please see discharge summary for a list of discharge medications.  Relevant Imaging Results:  Relevant Lab Results:   Additional Information ssn: [id number]  Servando Snare, LCSW

## 2017-05-23 ENCOUNTER — Observation Stay (HOSPITAL_COMMUNITY): Payer: Medicare Other

## 2017-05-23 DIAGNOSIS — I10 Essential (primary) hypertension: Secondary | ICD-10-CM

## 2017-05-23 DIAGNOSIS — N289 Disorder of kidney and ureter, unspecified: Secondary | ICD-10-CM | POA: Diagnosis not present

## 2017-05-23 DIAGNOSIS — G35 Multiple sclerosis: Secondary | ICD-10-CM | POA: Diagnosis not present

## 2017-05-23 LAB — GLUCOSE, CAPILLARY
GLUCOSE-CAPILLARY: 94 mg/dL (ref 65–99)
GLUCOSE-CAPILLARY: 103 mg/dL — AB (ref 65–99)
Glucose-Capillary: 91 mg/dL (ref 65–99)
Glucose-Capillary: 78 mg/dL (ref 65–99)

## 2017-05-23 MED ORDER — GADOBENATE DIMEGLUMINE 529 MG/ML IV SOLN
20.0000 mL | Freq: Once | INTRAVENOUS | Status: AC | PRN
  Administered 2017-05-23: 19 mL via INTRAVENOUS

## 2017-05-23 MED ORDER — LORAZEPAM 2 MG/ML IJ SOLN
2.0000 mg | Freq: Once | INTRAMUSCULAR | Status: AC
  Administered 2017-05-23: 2 mg via INTRAVENOUS

## 2017-05-23 MED ORDER — LORAZEPAM 2 MG/ML IJ SOLN
INTRAMUSCULAR | Status: AC
  Filled 2017-05-23: qty 1

## 2017-05-23 NOTE — Discharge Summary (Signed)
Physician Discharge Summary  Dawn Peters  KZS:[id number]  DOB: February 24, 1981  DOA: 05/19/2017 PCP: Armanda Heritage, NP  Admit date: 05/19/2017 Discharge date: 05/23/2017  Admitted From: Home  Disposition:  SNF   Recommendations for Outpatient Follow-up:  1. Follow up with SNF provider in 1 week 2. Follow-up with neurologist in 4 weeks - Dr Felecia Shelling  3. Obtain CBC/BMP in 1 week to monitor renal function and hemoglobin  Discharge Condition: Stable CODE STATUS: Full code Diet recommendation: Heart Healthy  Brief/Interim Summary: For full details see H&P/Progress note, but in brief, Dawn Peters is a 37 year old female with medical history of anxiety, depression, hypertension and MS who presented to the emergency department with left-sided weakness.  Upon initial evaluation patient blood pressure was found to be elevated up to 140 101,/noted  left lower extremity weakness and patient was admitted with working diagnosis of multiple sclerosis exacerbation.  Subjective: Patient seen and examined, she reported that her weakness has improved from yesterday.  No acute events overnight.  Denies chest pain, blurry vision and dizziness  Discharge Diagnoses/Hospital Course:  Multiple sclerosis exacerbation/pseudo-exacerbation Patient received supportive medical therapy and was seen by neurology in consultation who recommended Solu-Medrol 1 g daily for 3 days.  Patient had neurochecks done.  Baseline therefore her weakness did not improve to PT was consulted and recommended short-term rehab.  MRI of the brain, cervical and thoracic spine show no new lesions or progressive finding.  Patient will be discharged to SNF for short-term rehab and will need neurology follow-up as an outpatient.  Acute kidney injury on chronic kidney disease stage III Patient was treated with IV fluids, renal function improved and creatinine went back to baseline.  Continue to hold on nephrotoxic agent (NSAIDs) and avoid if  hypotension.  Follow-up with PCP  Hypertension BP was labile during hospital stay patient was started on metoprolol and amlodipine with improvement of readings.  Continue metoprolol and amlodipine.  Stress-induced hyperglycemia Due to high-dose steroids, A1c was 5.3.  She was treated with insulin sliding scale during hospital stay. Monitor as an outpatient  Left hiatal moderate hydronephrosis with neurogenic bladder Patient was seen by urology, no indication for acute intervention patient will have outpatient follow-up for imaging and urodynamic studies with Dr. Louis Meckel  Depression Patient was continued on trazodone, lamotrigine, citalopram, buspirone and amitriptyline No confusion no agitation no SI/HI  All other chronic medical condition were stable during the hospitalization.  Patient was seen by physical therapy, recommending short-term rehab On the day of the discharge the patient's vitals were stable, and no other acute medical condition were reported by patient. the patient was felt safe to be discharge to SNF  Discharge Instructions  You were cared for by a hospitalist during your hospital stay. If you have any questions about your discharge medications or the care you received while you were in the hospital after you are discharged, you can call the unit and asked to speak with the hospitalist on call if the hospitalist that took care of you is not available. Once you are discharged, your primary care physician will handle any further medical issues. Please note that NO REFILLS for any discharge medications will be authorized once you are discharged, as it is imperative that you return to your primary care physician (or establish a relationship with a primary care physician if you do not have one) for your aftercare needs so that they can reassess your need for medications and monitor your lab values.  Discharge Instructions  Diet - low sodium heart healthy   Complete by:  As  directed    Discharge instructions   Complete by:  As directed    Please follow with primary care in 7 days.   Face-to-face encounter (required for Medicare/Medicaid patients)   Complete by:  As directed    I Jimmy Picket Arrien certify that this patient is under my care and that I, or a nurse practitioner or physician's assistant working with me, had a face-to-face encounter that meets the physician face-to-face encounter requirements with this patient on 05/22/2017. The encounter with the patient was in whole, or in part for the following medical condition(s) which is the primary reason for home health care (List medical condition): multiple sclerosis   The encounter with the patient was in whole, or in part, for the following medical condition, which is the primary reason for home health care:  multiple sclerosis   I certify that, based on my findings, the following services are medically necessary home health services:  Physical therapy   Reason for Medically Necessary Home Health Services:   Skilled Nursing- Teaching of Disease Process/Symptom Management Therapy- Therapeutic Exercises to Increase Strength and Endurance     My clinical findings support the need for the above services:  Unsafe ambulation due to balance issues   Further, I certify that my clinical findings support that this patient is homebound due to:  Unable to leave home safely without assistance   Home Health   Complete by:  As directed    To provide the following care/treatments:   PT OT     Increase activity slowly   Complete by:  As directed      Allergies as of 05/23/2017      Reactions   Ace Inhibitors Swelling   Lisinopril Swelling   Lower lip swelling      Medication List    STOP taking these medications   acetaminophen 325 MG tablet Commonly known as:  TYLENOL   dalfampridine 10 MG Tb12   gabapentin 800 MG tablet Commonly known as:  NEURONTIN   naproxen 375 MG tablet Commonly known as:   NAPROSYN   TRI-LO-MARZIA 0.18/0.215/0.25 MG-25 MCG tab Generic drug:  Norgestimate-Ethinyl Estradiol Triphasic     TAKE these medications   amitriptyline 25 MG tablet Commonly known as:  ELAVIL TAKE ONE OR TWO AT BEDTIME What changed:  See the new instructions.   amLODipine 5 MG tablet Commonly known as:  NORVASC Take 1 tablet (5 mg total) by mouth daily.   amphetamine-dextroamphetamine 20 MG 24 hr capsule Commonly known as:  ADDERALL XR Take 1 capsule (20 mg total) by mouth daily.   baclofen 20 MG tablet Commonly known as:  LIORESAL Take 1 tablet (20 mg total) by mouth 3 (three) times daily as needed (for ms). What changed:  when to take this   busPIRone 15 MG tablet Commonly known as:  BUSPAR Take 1 tablet (15 mg total) by mouth 2 (two) times daily.   citalopram 40 MG tablet Commonly known as:  CELEXA Take 1 tablet (40 mg total) by mouth daily.   lamoTRIgine 200 MG tablet Commonly known as:  LAMICTAL Take 1 tablet (200 mg total) by mouth 2 (two) times daily.   metoprolol tartrate 25 MG tablet Commonly known as:  LOPRESSOR Take 0.5 tablets (12.5 mg total) by mouth 2 (two) times daily.   Oxcarbazepine 300 MG tablet Commonly known as:  TRILEPTAL Take 1 tablet (300 mg total) by mouth 2 (  two) times daily.   oxyCODONE-acetaminophen 5-325 MG tablet Commonly known as:  PERCOCET/ROXICET Take 1 tablet by mouth 4 (four) times daily.   pregabalin 200 MG capsule Commonly known as:  LYRICA Take 1 capsule (200 mg total) by mouth 2 (two) times daily.   SUMAtriptan 100 MG tablet Commonly known as:  IMITREX Take 1 tablet (100 mg total) by mouth once as needed for migraine. May repeat in 2 hours if headache persists or recurs.   tamsulosin 0.4 MG Caps capsule Commonly known as:  FLOMAX Take 0.4 mg by mouth daily.   tiZANidine 4 MG tablet Commonly known as:  ZANAFLEX TAKE 1 TABLET BY MOUTH AT BEDTIME   valACYclovir 500 MG tablet Commonly known as:  VALTREX Take 1  tablet (500 mg total) by mouth 2 (two) times daily.      Follow-up Information    Armanda Heritage, NP Follow up in 1 week(s).   Specialty:  Nurse Practitioner Contact information: [address] [place] [address] [phone]        Britt Bottom, MD. Go on 06/21/2017.   Specialty:  Neurology Why:  Please arrive at 330 PM. Contact information: [address] [place] [address] [phone]          Allergies  Allergen Reactions  . Ace Inhibitors Swelling  . Lisinopril Swelling    Lower lip swelling    Consultations:  Neurology  Urology   Procedures/Studies: Ct Abdomen Pelvis Wo Contrast  Result Date: 05/19/2017 CLINICAL DATA:  Hydronephrosis. Multiple sclerosis with recent flare. Stage II renal failure. EXAM: CT ABDOMEN AND PELVIS WITHOUT CONTRAST TECHNIQUE: Multidetector CT imaging of the abdomen and pelvis was performed following the standard protocol without IV contrast. COMPARISON:  Renal ultrasound 04/09/2010. FINDINGS: Lower chest: Minimal atelectasis is present at the left lung base. The lung bases are otherwise clear. The heart size is normal. No significant pleural or pericardial effusion is present. Hepatobiliary: A hypodense lesion at the dome of the right lobe of the liver on image 11 of series 2 measures 8 mm. At least 2 additional subcentimeter lesions are present in the right lobe. A hypodense lesion posteriorly in the right lobe is well-defined, measuring 12 x 12 x 13 mm. Pancreas: Unremarkable. No pancreatic ductal dilatation or surrounding inflammatory changes. Spleen: Normal in size without focal abnormality. Adrenals/Urinary Tract: The right adrenal gland scratched at the adrenal glands are normal bilaterally. The right kidney in ureter is within normal limits. Severe left-sided hydronephrosis is present. There is marked tortuosity of the left ureter throughout its course in the abdomen. There is focal stenosis at the pelvic inlet in the  region of the left adnexa. Stomach/Bowel: Stomach and duodenum are within normal limits. Small bowel is normal. Terminal ileum is within normal limits. The appendix is visualized and normal. The ascending and transverse colon are within normal limits. The descending and sigmoid colon are normal. Vascular/Lymphatic: No significant vascular findings are present. No enlarged abdominal or pelvic lymph nodes. Reproductive: The uterus is heterogeneous and somewhat irregular, suggesting fibroids. Adnexa are within normal limits. Other: No abdominal wall hernia or abnormality. No abdominopelvic ascites. Musculoskeletal: Vertebral body heights and alignment are normal. The bony pelvis is intact. 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 Hips are located. IMPRESSION: 1. Severe left-sided hydronephrosis. 2. The left ureter is dilated to the pelvic inlet. No definite obstructing lesion is present. Recommend urology consultation for further evaluation. This is a nonemergent process. 3. Hypodense lesions of the liver likely represent benign hepatic cysts.  Consider MRI of the liver without and with contrast for definitive characterization. Electronically Signed   By: San Morelle M.D.   On: 05/19/2017 16:39   Mr Jeri Cos IW Contrast  Result Date: 05/23/2017 CLINICAL DATA:  Progressive left leg weakness for 8 days. Multiple sclerosis. New neurologic event. EXAM: MRI HEAD WITHOUT AND WITH CONTRAST TECHNIQUE: Multiplanar, multiecho pulse sequences of the brain and surrounding structures were obtained without and with intravenous contrast. CONTRAST:  23mL MULTIHANCE GADOBENATE DIMEGLUMINE 529 MG/ML IV SOLN COMPARISON:  MRI brain 04/20/2017. FINDINGS: Brain: The diffusion-weighted images demonstrate no restricted diffusion. Confluent periventricular T2 changes with volume loss and association of the corpus callosum are stable. No new lesions are present. The ventricles are stable in size. No significant extra-axial fluid  collection is present. The brainstem and cerebellum are normal. Postcontrast images demonstrate no pathologic enhancement. There is no enhancement associated with the periventricular lesions. Vascular: Flow is present in the major intracranial arteries. Skull and upper cervical spine: Skull base is within normal limits. The craniocervical junction is normal. The upper cervical spine is within normal limits. Sinuses/Orbits: The paranasal sinuses and mastoid air cells are clear. Globes and orbits are within normal limits. IMPRESSION: 1. Stable periventricular lesions compatible with the given diagnosis of multiple sclerosis. 2. No new lesions or evidence for active demyelination. Electronically Signed   By: San Morelle M.D.   On: 05/23/2017 12:20   Mr Cervical Spine W Wo Contrast  Result Date: 05/19/2017 CLINICAL DATA:  History of multiple sclerosis. Unable to walk. Left leg weakness and numbness. EXAM: MRI CERVICAL AND thoracic SPINE WITHOUT AND WITH CONTRAST TECHNIQUE: Multiplanar and multiecho pulse sequences of the cervical spine, to include the craniocervical junction and cervicothoracic junction, and thoracic spine, were obtained without and with intravenous contrast. CONTRAST:  17mL MULTIHANCE GADOBENATE DIMEGLUMINE 529 MG/ML IV SOLN COMPARISON:  08/13/2016 FINDINGS: MRI CERVICAL SPINE FINDINGS Alignment: Normal Vertebrae: Normal Cord: Chronic cord atrophy with low level T2 signal in the diffuse central cord, similar to the previous exams. No sign of new focal involvement. No abnormal contrast enhancement. Posterior Fossa, vertebral arteries, paraspinal tissues: See results of previous brain study. Disc levels: No degenerative disc disease. No facet arthropathy. No stenosis or neural compression. MRI thoracic SPINE FINDINGS Alignment:  Normal Vertebrae:  No fracture or primary bone lesion. Cord: No focal cord lesion. Mild generalized cord atrophy. Question low level T2 signal as seen previously  that could represent chronic demyelinating change. No abnormal contrast enhancement. Paraspinal and other soft tissues: Chronic left hydronephrosis. Disc levels: No significant degenerative changes. No stenosis of the canal or foramina. IMPRESSION: No new or progressive finding. This patient appears to have some atrophic change of the cervical and thoracic cord and as seen previously, has very low level fairly homogeneous T2 signal in the cord best shown on the gradient echo imaging. No focal contrast enhancement or cord swelling. Left hydroureteronephrosis as previously seen. Has this been evaluated? Electronically Signed   By: Nelson Chimes M.D.   On: 05/19/2017 13:09   Mr Thoracic Spine W Wo Contrast  Result Date: 05/19/2017 CLINICAL DATA:  History of multiple sclerosis. Unable to walk. Left leg weakness and numbness. EXAM: MRI CERVICAL AND thoracic SPINE WITHOUT AND WITH CONTRAST TECHNIQUE: Multiplanar and multiecho pulse sequences of the cervical spine, to include the craniocervical junction and cervicothoracic junction, and thoracic spine, were obtained without and with intravenous contrast. CONTRAST:  87mL MULTIHANCE GADOBENATE DIMEGLUMINE 529 MG/ML IV SOLN COMPARISON:  08/13/2016 FINDINGS: MRI  CERVICAL SPINE FINDINGS Alignment: Normal Vertebrae: Normal Cord: Chronic cord atrophy with low level T2 signal in the diffuse central cord, similar to the previous exams. No sign of new focal involvement. No abnormal contrast enhancement. Posterior Fossa, vertebral arteries, paraspinal tissues: See results of previous brain study. Disc levels: No degenerative disc disease. No facet arthropathy. No stenosis or neural compression. MRI thoracic SPINE FINDINGS Alignment:  Normal Vertebrae:  No fracture or primary bone lesion. Cord: No focal cord lesion. Mild generalized cord atrophy. Question low level T2 signal as seen previously that could represent chronic demyelinating change. No abnormal contrast enhancement.  Paraspinal and other soft tissues: Chronic left hydronephrosis. Disc levels: No significant degenerative changes. No stenosis of the canal or foramina. IMPRESSION: No new or progressive finding. This patient appears to have some atrophic change of the cervical and thoracic cord and as seen previously, has very low level fairly homogeneous T2 signal in the cord best shown on the gradient echo imaging. No focal contrast enhancement or cord swelling. Left hydroureteronephrosis as previously seen. Has this been evaluated? Electronically Signed   By: Nelson Chimes M.D.   On: 05/19/2017 13:09   Dg Knee Complete 4 Views Left  Result Date: 05/19/2017 CLINICAL DATA:  Pain and swelling EXAM: LEFT KNEE - COMPLETE 4+ VIEW COMPARISON:  April 13, 2015 FINDINGS: Frontal, lateral, and bilateral oblique views were obtained. No evident fracture or dislocation. No joint effusion. There is no appreciable joint space narrowing. There is a small osteochondral defect along the lateral distal femoral condyle. No frank erosion. IMPRESSION: No fracture or joint effusion. 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 No loose body. No frank erosion. No appreciable joint space narrowing. Electronically Signed   By: Lowella Grip III M.D.   On: 05/19/2017 17:06    Discharge Exam: Vitals:   05/23/17 0531 05/23/17 1340  BP: 114/89 120/85  Pulse: 95 (!) 105  Resp: 16 18  Temp: 97.9 F (36.6 C) 98.3 F (36.8 C)  SpO2: 97% 100%   Vitals:   05/22/17 1349 05/22/17 2129 05/23/17 0531 05/23/17 1340  BP: 135/87 (!) 148/99 114/89 120/85  Pulse: 97 (!) 114 95 (!) 105  Resp: 20 18 16 18   Temp: 98.5 F (36.9 C) 98.4 F (36.9 C) 97.9 F (36.6 C) 98.3 F (36.8 C)  TempSrc: Oral Oral Oral Oral  SpO2: 100% 98% 97% 100%  Weight:      Height:        General: Pt is alert, awake, not in acute distress Cardiovascular: RRR, S1/S2 +, no rubs, no  gallops Respiratory: CTA bilaterally, no wheezing, no rhonchi Abdominal: Soft, NT, ND, bowel sounds + Extremities: Left lower extremity weakness 4/5  The results of significant diagnostics from this hospitalization (including imaging, microbiology, ancillary and laboratory) are listed below for reference.     Microbiology: No results found for this or any previous visit (from the past 240 hour(s)).   Labs: BNP (last 3 results) No results for input(s): BNP in the last 8760 hours. Basic Metabolic Panel: Recent Labs  Lab 05/17/17 1242 05/19/17 0336 05/21/17 0741  NA 142 141 140  K 4.1 3.4* 3.4*  CL 99 102 102  CO2 23 27 28   GLUCOSE 104* 97 100*  BUN 28* 30* 25*  CREATININE 1.70* 1.60* 1.39*  CALCIUM 9.6 9.5 8.9   Liver Function Tests: Recent Labs  Lab 05/19/17 0336  AST 20  ALT 20  ALKPHOS 83  BILITOT 0.5  PROT 7.6  ALBUMIN 4.1   No results for input(s): LIPASE, AMYLASE in the last 168 hours. No results for input(s): AMMONIA in the last 168 hours. CBC: Recent Labs  Lab 05/19/17 0336  WBC 6.8  NEUTROABS 5.4  HGB 13.5  HCT 39.0  MCV 95.8  PLT 301   Cardiac Enzymes: No results for input(s): CKTOTAL, CKMB, CKMBINDEX, TROPONINI in the last 168 hours. BNP: Invalid input(s): POCBNP CBG: Recent Labs  Lab 05/22/17 1628 05/22/17 2134 05/23/17 0748 05/23/17 1217 05/23/17 1230  GLUCAP 106* 145* 91 94 103*   D-Dimer No results for input(s): DDIMER in the last 72 hours. Hgb A1c No results for input(s): HGBA1C in the last 72 hours. Lipid Profile No results for input(s): CHOL, HDL, LDLCALC, TRIG, CHOLHDL, LDLDIRECT in the last 72 hours. Thyroid function studies No results for input(s): TSH, T4TOTAL, T3FREE, THYROIDAB in the last 72 hours.  Invalid input(s): FREET3 Anemia work up No results for input(s): VITAMINB12, FOLATE, FERRITIN, TIBC, IRON, RETICCTPCT in the last 72 hours. Urinalysis    Component Value Date/Time   COLORURINE YELLOW 05/19/2017 0211    APPEARANCEUR CLEAR 05/19/2017 0211   APPEARANCEUR Clear 04/19/2016 1419   LABSPEC 1.020 05/19/2017 0211   PHURINE 6.0 05/19/2017 0211   GLUCOSEU NEGATIVE 05/19/2017 0211   HGBUR NEGATIVE 05/19/2017 0211   BILIRUBINUR NEGATIVE 05/19/2017 0211   BILIRUBINUR Negative 04/19/2016 1419   KETONESUR NEGATIVE 05/19/2017 0211   PROTEINUR NEGATIVE 05/19/2017 0211   UROBILINOGEN 0.2 12/15/2016 1136   NITRITE NEGATIVE 05/19/2017 0211   LEUKOCYTESUR NEGATIVE 05/19/2017 0211   LEUKOCYTESUR Negative 04/19/2016 1419   Sepsis Labs Invalid input(s): PROCALCITONIN,  WBC,  LACTICIDVEN Microbiology No results found for this or any previous visit (from the past 240 hour(s)).   Time coordinating discharge: 33 minutes  SIGNED:  Chipper Oman, MD  [facility] 05/23/2017, 2:29 PM  Pager please text page via  [email or web address]  Note - This record has been created using [employer]. Chart creation errors have been sought, but may not always have been located. Such creation errors do not reflect on the standard of medical care.

## 2017-05-23 NOTE — Progress Notes (Signed)
   05/23/17 1800  What Happened  Was fall witnessed? Yes  Who witnessed fall? 2 EMS staff, tabitha NT  Patients activity before fall to/from bed, chair, or stretcher  Point of contact buttocks  Was patient injured? No  Patient found other (Comment) (assisted to floor )  Stated prior activity to/from bed, chair, or stretcher  Follow Up  MD notified silva MD  Time MD notified [phone]  Additional tests No  Adult Fall Risk Assessment  Risk Factor Category (scoring not indicated) Fall has occurred during this admission (document High fall risk)  Patient's Fall Risk High Fall Risk (>13 points)  Adult Fall Risk Interventions  Required Bundle Interventions *See Row Information* High fall risk - low, moderate, and high requirements implemented  Screening for Fall Injury Risk  Risk For Fall Injury- See Row Information  None identified  Pain Assessment  Pain Assessment No/denies pain  Neurological  Neuro (WDL) WDL  Level of Consciousness Alert  Orientation Level Oriented X4  Neuro Symptoms Anxiety;Fatigue  Musculoskeletal  Musculoskeletal (WDL) X  Generalized Weakness Yes  Integumentary  Integumentary (WDL) WDL

## 2017-05-23 NOTE — Clinical Social Work Placement (Signed)
    3:32 PM Patient chose bed at [facility].  LCSW confirmed bed with facility. Room 206.  LCSW faxed dc docs to facility.   Patient will transport by [facility].   Patient's boyfriend notified.   RN report number: [phone]  [facility]  CLINICAL SOCIAL WORK PLACEMENT  NOTE  Date:  05/23/2017  Patient Details  Name: Dawn Peters MRN: [id number] Date of Birth: 04/17/1980  Clinical Social Work is seeking post-discharge placement for this patient at the [place] level of care (*CSW will initial, date and re-position this form in  chart as items are completed):  Yes   Patient/family provided with [facility] Work Department's list of facilities offering this level of care within the geographic area requested by the patient (or if unable, by the patient's family).  Yes   Patient/family informed of their freedom to choose among providers that offer the needed level of care, that participate in Medicare, Medicaid or managed care program needed by the patient, have an available bed and are willing to accept the patient.  Yes   Patient/family informed of [facility]'s ownership interest in [facility] and [facility], as well as of the fact that they are under no obligation to receive care at these facilities.  PASRR submitted to EDS on       PASRR number received on 05/22/17     Existing PASRR number confirmed on       FL2 transmitted to all facilities in geographic area requested by pt/family on 05/22/17     FL2 transmitted to all facilities within larger geographic area on       Patient informed that his/her managed care company has contracts with or will negotiate with certain facilities, including the following:        Yes   Patient/family informed of bed offers received.  Patient chooses bed at [facility]     Physician recommends and patient chooses bed at      Patient to be transferred to [facility] on 05/23/17.  Patient to be transferred to facility by EMS     Patient family notified on 05/23/17 of transfer.  Name of family member notified:  Patient notified family     PHYSICIAN Please prepare priority discharge summary, including medications     Additional Comment:    _______________________________________________ Servando Snare, LCSW 05/23/2017, 3:32 PM

## 2017-05-23 NOTE — Care Management Obs Status (Signed)
[facility] NOTIFICATION   Patient Details  Name: NATHIFA RITTHALER MRN: [id number] Date of Birth: 1980/08/23   Medicare Observation Status Notification Given:  Yes    Lynnell Catalan, RN 05/23/2017, 3:16 PM

## 2017-05-23 NOTE — Progress Notes (Signed)
Patient has discharged to [facility] for rahab at 05/23/17. Discharge instruction including medication and appointment was given to patient. Patient has no question at this time. RN called for report to RN at [place] at 1425. SW is notified. PTAR is set up by SW.

## 2017-05-23 NOTE — Telephone Encounter (Signed)
Spoke with [employer], who reports she is still at [facility]. Was having more trouble walking yesterday, now doing better. Per RAS, I have explained that, although she has good and bad days with MS sx, she has not had a definite exacerbation on [place].  She sts. the hospitalist has told her she's had an exacerbation, and although we discussed that she may have some progressive worsening of some sx as a result of MS, this does not always represent an exacerbation.  She was on Tecfidera prior to [place]. would like to restart Tecfidera.  Per RAS, this is ok. Pt. aware she needs to come in to sign Tecfidera srf.  I have placed the form up front [facility], with the 2 spots she needs to sign clearly marked/fim

## 2017-05-26 ENCOUNTER — Encounter (HOSPITAL_COMMUNITY): Payer: Self-pay | Admitting: Emergency Medicine

## 2017-05-26 ENCOUNTER — Emergency Department (HOSPITAL_COMMUNITY): Payer: Medicare Other

## 2017-05-26 ENCOUNTER — Emergency Department (HOSPITAL_COMMUNITY)
Admission: EM | Admit: 2017-05-26 | Discharge: 2017-05-27 | Disposition: A | Discharge status: Home / Self Care | Payer: Medicare Other | Attending: Emergency Medicine | Admitting: Emergency Medicine

## 2017-05-26 DIAGNOSIS — Z86718 Personal history of other venous thrombosis and embolism: Secondary | ICD-10-CM | POA: Insufficient documentation

## 2017-05-26 DIAGNOSIS — H532 Diplopia: Secondary | ICD-10-CM | POA: Insufficient documentation

## 2017-05-26 DIAGNOSIS — Z79899 Other long term (current) drug therapy: Secondary | ICD-10-CM | POA: Insufficient documentation

## 2017-05-26 DIAGNOSIS — R42 Dizziness and giddiness: Secondary | ICD-10-CM | POA: Diagnosis present

## 2017-05-26 DIAGNOSIS — G35 Multiple sclerosis: Secondary | ICD-10-CM | POA: Diagnosis not present

## 2017-05-26 DIAGNOSIS — H538 Other visual disturbances: Secondary | ICD-10-CM | POA: Diagnosis not present

## 2017-05-26 DIAGNOSIS — I10 Essential (primary) hypertension: Secondary | ICD-10-CM | POA: Insufficient documentation

## 2017-05-26 LAB — CBC WITH DIFFERENTIAL/PLATELET
BASOS PCT: 0 %
Basophils Absolute: 0 10*3/uL (ref 0.0–0.1)
Eosinophils Absolute: 0.1 10*3/uL (ref 0.0–0.7)
Eosinophils Relative: 2 %
HEMATOCRIT: 37 % (ref 36.0–46.0)
HEMOGLOBIN: 12.2 g/dL (ref 12.0–15.0)
Lymphocytes Relative: 9 %
Lymphs Abs: 0.6 10*3/uL — ABNORMAL LOW (ref 0.7–4.0)
MCH: 32.9 pg (ref 26.0–34.0)
MCHC: 33 g/dL (ref 30.0–36.0)
MCV: 99.7 fL (ref 78.0–100.0)
Monocytes Absolute: 0.9 10*3/uL (ref 0.1–1.0)
Monocytes Relative: 14 %
NEUTROS ABS: 5.1 10*3/uL (ref 1.7–7.7)
NEUTROS PCT: 75 %
Platelets: 219 10*3/uL (ref 150–400)
RBC: 3.71 MIL/uL — AB (ref 3.87–5.11)
RDW: 16.1 % — ABNORMAL HIGH (ref 11.5–15.5)
WBC: 6.7 10*3/uL (ref 4.0–10.5)

## 2017-05-26 LAB — URINALYSIS, ROUTINE W REFLEX MICROSCOPIC
Bilirubin Urine: NEGATIVE
GLUCOSE, UA: NEGATIVE mg/dL
Hgb urine dipstick: NEGATIVE
KETONES UR: NEGATIVE mg/dL
Leukocytes, UA: NEGATIVE
Nitrite: NEGATIVE
PH: 5 (ref 5.0–8.0)
PROTEIN: NEGATIVE mg/dL
Specific Gravity, Urine: 1.028 (ref 1.005–1.030)

## 2017-05-26 LAB — COMPREHENSIVE METABOLIC PANEL
ALBUMIN: 3.5 g/dL (ref 3.5–5.0)
ALK PHOS: 74 U/L (ref 38–126)
ALT: 19 U/L (ref 14–54)
AST: 28 U/L (ref 15–41)
Anion gap: 8 (ref 5–15)
BILIRUBIN TOTAL: 0.3 mg/dL (ref 0.3–1.2)
BUN: 24 mg/dL — ABNORMAL HIGH (ref 6–20)
CALCIUM: 8.9 mg/dL (ref 8.9–10.3)
CO2: 30 mmol/L (ref 22–32)
Chloride: 102 mmol/L (ref 101–111)
Creatinine, Ser: 1.41 mg/dL — ABNORMAL HIGH (ref 0.44–1.00)
GFR calc Af Amer: 54 mL/min — ABNORMAL LOW (ref >60)
GFR calc non Af Amer: 47 mL/min — ABNORMAL LOW (ref >60)
GLUCOSE: 95 mg/dL (ref 65–99)
Potassium: 4 mmol/L (ref 3.5–5.1)
Sodium: 140 mmol/L (ref 135–145)
TOTAL PROTEIN: 6.6 g/dL (ref 6.5–8.1)

## 2017-05-26 MED ORDER — SODIUM CHLORIDE 0.9 % IV SOLN
1000.0000 mg | Freq: Once | INTRAVENOUS | Status: AC
  Administered 2017-05-26: 1000 mg via INTRAVENOUS
  Filled 2017-05-26: qty 8

## 2017-05-26 MED ORDER — GADOBENATE DIMEGLUMINE 529 MG/ML IV SOLN
20.0000 mL | Freq: Once | INTRAVENOUS | Status: AC | PRN
  Administered 2017-05-26: 20 mL via INTRAVENOUS

## 2017-05-26 NOTE — ED Notes (Signed)
Bed: MA26 Expected date:  Expected time:  Means of arrival:  Comments: Hold EMS MS hx not feeling well

## 2017-05-26 NOTE — Discharge Instructions (Signed)
Your MRI today did not show evidence of new MS flare.  The neurology team felt it was prudent to give you another dose of steroids however please call your MS specialist tomorrow to discuss further management.  Please see the PT/OT team at your nursing facility.  We did not find evidence of acute infections tonight.  If any symptoms change or worsen, please return to the nearest emergency department.

## 2017-05-26 NOTE — ED Notes (Signed)
Helped Pt get dressed into gown and remove bra. Pt had some money in her bra and requested that tech put it in her sock. $20 was placed in her left sock.

## 2017-05-26 NOTE — ED Triage Notes (Signed)
Per EMS, patient from [facility] SN[facility], c/o dizziness worsening with movmvent and bilateral blurred vision since this morning. Recent admission for MS exacerbation. Denies N/V/D, chest pain and SOB. Non ambulatory.

## 2017-05-26 NOTE — ED Notes (Signed)
Patient transported to MRI on the monitor condition stable

## 2017-05-26 NOTE — ED Notes (Signed)
Placed Pt on [place] catheter. Pericare was performed prior to placement.

## 2017-05-26 NOTE — ED Provider Notes (Signed)
Dawn Peters   CSN: [id number] Arrival date & time: 05/26/17  1731     History   Chief Complaint Chief Complaint  Patient presents with  . Blurred Vision  . Dizziness    HPI Dawn Peters is a 37 y.o. female.  The history is provided by the patient and medical records.  Dizziness  Quality:  Lightheadedness and room spinning Severity:  Mild Onset quality:  Gradual Duration:  2 days Timing:  Constant Progression:  Unchanged Chronicity:  New Relieved by:  Nothing Worsened by:  Nothing Ineffective treatments:  None tried Associated symptoms: vision changes and weakness   Associated symptoms: no chest pain, no diarrhea, no headaches, no nausea, no palpitations, no shortness of breath and no vomiting   Eye Problem   This is a new problem. The current episode started 6 to 12 hours ago. The problem occurs constantly. The problem has not changed since onset.There is a problem in both eyes. The patient is experiencing no pain. Associated symptoms include blurred vision, double vision and weakness. Pertinent negatives include no photophobia, no nausea and no vomiting. She has tried nothing for the symptoms.    Past Medical History:  Diagnosis Date  . Anxiety   . Depression   . Hypertension   . MS (multiple sclerosis) [facility])     Patient Active Problem List   Diagnosis Date Noted  . Elevated serum creatinine 05/12/2017  . Acute encephalopathy 04/20/2017  . Acute kidney injury superimposed on CKD ([place]) 04/20/2017  . Hydronephrosis of left kidney 10/24/2016  . Attention deficit 10/24/2016  . Left leg weakness 08/13/2016  . Chronic pain 08/13/2016  . Mild renal insufficiency 08/13/2016  . Left-sided weakness   . High risk medication use 09/15/2015  . Hip pain, bilateral 07/28/2015  . Urinary hesitancy 07/28/2015  . Multiple sclerosis exacerbation (Dawn Peters) 07/17/2015  . Urinary disorder 06/11/2015  . Gait disturbance  05/19/2015  . Numbness 05/19/2015  . Depression with anxiety 05/13/2015  . Other fatigue 05/13/2015  . Left knee pain 05/13/2015  . Avascular necrosis of bones of both hips (Dawn Peters) 05/13/2015  . Screening for HIV (human immunodeficiency virus) 07/08/2014  . Essential hypertension, benign 11/19/2013  . Anxiety state, unspecified 11/19/2013  . Screen for STD (sexually transmitted disease) 11/01/2013  . Encounter for routine gynecological examination 11/01/2013  . Oral contraceptive use 10/20/2011  . Multiple sclerosis ([place]) 08/08/2007  . RASH AND OTHER NONSPECIFIC SKIN ERUPTION 06/22/2007  . AVASCULAR NECROSIS, FEMORAL HEAD 03/11/2006  . Essential hypertension 02/03/2006  . 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 DVT, HX OF 02/03/2006    Past Surgical History:  Procedure Laterality Date  . BILATERAL HIP ARTHROSCOPY Left 07/2013  . JOINT REPLACEMENT Bilateral     R 2004 and L 2005     OB History    Gravida  1   Para  1   Term  1   Preterm      AB      Living  1     SAB      TAB      Ectopic      Multiple      Live Births  1            Home Medications    Prior to Admission medications   Medication Sig Start Date End Date Taking? Authorizing Provider  amitriptyline (ELAVIL) 25 MG tablet TAKE ONE OR TWO AT BEDTIME Patient taking differently: Take 1 tablet by mouth daily  11/09/16  Yes Sater, Nanine Means, MD  amLODipine (NORVASC) 5 MG tablet Take 1 tablet (5 mg total) by mouth daily. 05/21/17  Yes Arrien, Jimmy Picket, MD  amphetamine-dextroamphetamine (ADDERALL XR) 20 MG 24 hr capsule Take 1 capsule (20 mg total) by mouth daily. 04/19/17  Yes Sater, Nanine Means, MD  baclofen (LIORESAL) 20 MG tablet Take 1 tablet (20 mg total) by mouth 3 (three) times daily as needed (for ms). Patient taking differently: Take 20 mg by mouth 3 (three) times daily.  05/04/16  Yes Sater, Nanine Means, MD  busPIRone (BUSPAR) 15 MG tablet Take 1 tablet (15 mg total) by mouth 2 (two) times daily.  05/04/16  Yes Sater, Nanine Means, MD  citalopram (CELEXA) 40 MG tablet Take 1 tablet (40 mg total) by mouth daily. 05/04/16  Yes Sater, Nanine Means, MD  lamoTRIgine (LAMICTAL) 200 MG tablet Take 1 tablet (200 mg total) by mouth 2 (two) times daily. 08/05/16  Yes Sater, Nanine Means, MD  metoprolol tartrate (LOPRESSOR) 25 MG tablet Take 0.5 tablets (12.5 mg total) by mouth 2 (two) times daily. Patient taking differently: Take 25 mg by mouth 2 (two) times daily.  05/21/17 06/20/17 Yes Arrien, Jimmy Picket, MD  Oxcarbazepine (TRILEPTAL) 300 MG tablet Take 1 tablet (300 mg total) by mouth 2 (two) times daily. 11/11/15  Yes Sater, Nanine Means, MD  oxyCODONE-acetaminophen (PERCOCET/ROXICET) 5-325 MG tablet Take 1 tablet by mouth 4 (four) times daily. 04/23/17  Yes Theodis Blaze, MD  pregabalin (LYRICA) 200 MG capsule Take 1 capsule (200 mg total) by mouth 2 (two) times daily. 05/17/17  Yes Sater, Nanine Means, MD  tamsulosin (FLOMAX) 0.4 MG CAPS capsule Take 0.4 mg by mouth daily.   Yes [provider]  tiZANidine (ZANAFLEX) 4 MG tablet TAKE 1 TABLET BY MOUTH AT BEDTIME 10/18/16  Yes Sater, Nanine Means, MD  valACYclovir (VALTREX) 500 MG tablet Take 1 tablet (500 mg total) by mouth 2 (two) times daily. 03/20/17  Yes Sater, Nanine Means, MD  SUMAtriptan (IMITREX) 100 MG tablet Take 1 tablet (100 mg total) by mouth once as needed for migraine. May repeat in 2 hours if headache persists or recurs. 12/28/16   Sater, Nanine Means, MD    Family History Family History  Problem Relation Age of Onset  . Healthy Mother   . Healthy Father   . Breast cancer Unknown        paternal grandmother dx age 32  . Colon cancer Unknown        paternal grandmother  . Hypertension Other   . Diabetes Other     Social History Social History   Tobacco Use  . Smoking status: Never Smoker  . Smokeless tobacco: Never Used  Substance Use Topics  . Alcohol use: No  . Drug use: No     Allergies   Ace inhibitors and  Lisinopril   Review of Systems Review of Systems  Constitutional: Negative for chills, diaphoresis, fatigue and fever.  HENT: Negative for congestion.   Eyes: Positive for blurred vision, double vision and visual disturbance. Negative for photophobia and pain.  Respiratory: Negative for cough, chest tightness, shortness of breath and wheezing.   Cardiovascular: Negative for chest pain and palpitations.  Gastrointestinal: Negative for abdominal pain, diarrhea, nausea and vomiting.  Genitourinary: Negative for dysuria, flank pain and frequency.  Musculoskeletal: Negative for back pain, neck pain and neck stiffness.  Neurological: Positive for dizziness, weakness and light-headedness. Negative for syncope, facial asymmetry, speech difficulty and headaches.  Psychiatric/Behavioral: Negative for agitation and confusion.  All other systems reviewed and are negative.    Physical Exam Updated Vital Signs BP 120/88 (BP Location: Right Arm)   Pulse (!) 103   Temp 98.5 F (36.9 C) (Oral)   Resp (!) 23   Ht 5\' 5"  (1.651 m)   Wt 86.2 kg (190 lb)   LMP 05/15/2017 (Exact Date)   SpO2 96%   BMI 31.62 kg/m   Physical Exam  Constitutional: She is oriented to person, place, and time. She appears well-developed and well-nourished. No distress.  HENT:  Mouth/Throat: Oropharynx is clear and moist. No oropharyngeal exudate.  Eyes: Pupils are equal, round, and reactive to light. Conjunctivae are normal. Right conjunctiva is not injected. Right conjunctiva has no hemorrhage. Left conjunctiva is not injected. Left conjunctiva has no hemorrhage. Right eye exhibits nystagmus. Left eye exhibits nystagmus. Pupils are equal.  Eyes are not aligned.  Neck: Normal range of motion.  Cardiovascular: Normal rate and intact distal pulses.  No murmur heard. Pulmonary/Chest: Effort normal and breath sounds normal. No stridor. No respiratory distress. She has no wheezes. She exhibits no tenderness.  Abdominal:  Soft. She exhibits no distension. There is no tenderness.  Musculoskeletal: She exhibits no edema or tenderness.  Lymphadenopathy:    She has no cervical adenopathy.  Neurological: She is alert and oriented to person, place, and time. She is not disoriented. She displays no tremor. No sensory deficit. She exhibits abnormal muscle tone. She displays no seizure activity. Coordination abnormal. GCS eye subscore is 4. GCS verbal subscore is 5. GCS motor subscore is 6.  Patient has weakness in both legs with flexion and lifting off the bed.  Normal sensation.  Normal pulses.  Normal grip strength bilaterally however patient has some ataxia with finger-nose-finger testing.  Gait was deferred due to the dizziness and lightheadedness.  Skin: Capillary refill takes less than 2 seconds. No rash noted. She is not diaphoretic. No erythema.  Psychiatric: She has a normal mood and affect.  Nursing Peters and vitals reviewed.    ED Treatments / Results  Labs (all labs ordered are listed, but only abnormal results are displayed) Labs Reviewed  CBC WITH DIFFERENTIAL/PLATELET - Abnormal; Notable for the following components:      Result Value   RBC 3.71 (*)    RDW 16.1 (*)    Lymphs Abs 0.6 (*)    All other components within normal limits  COMPREHENSIVE METABOLIC PANEL - Abnormal; Notable for the following components:   BUN 24 (*)    Creatinine, Ser 1.41 (*)    GFR calc non Af Amer 47 (*)    GFR calc Af Amer 54 (*)    All other components within normal limits  URINE CULTURE  URINALYSIS, ROUTINE W REFLEX MICROSCOPIC    EKG EKG Interpretation  Date/Time:  Friday May 26 2017 17:48:04 EDT Ventricular Rate:  103 PR Interval:    QRS Duration: 95 QT Interval:  367 QTC Calculation: 481 R Axis:   -2 Text Interpretation:  Sinus tachycardia When compared to prior, similar tachycardia.  No STEMI Confirmed by Antony Blackbird [phone]) on 05/26/2017 7:43:18 PM   Radiology Mr Brain W And Wo  Contrast  Result Date: 05/26/2017 CLINICAL DATA:  Dizziness and blurry vision. EXAM: MRI HEAD AND ORBITS WITHOUT AND WITH CONTRAST TECHNIQUE: Multiplanar, multiecho pulse sequences of the brain and surrounding structures were obtained without and with intravenous contrast. Multiplanar, multiecho pulse sequences of the orbits and surrounding structures were obtained  including fat saturation techniques, before and after intravenous contrast administration. CONTRAST:  32mL MULTIHANCE GADOBENATE DIMEGLUMINE 529 MG/ML IV SOLN COMPARISON:  Brain MRI 05/23/2017 FINDINGS: MRI HEAD FINDINGS Brain: The midline structures are normal. There is no acute infarct or acute hemorrhage. No mass lesion, hydrocephalus, dural abnormality or extra-axial collection. There is posterior predominant periventricular hyperintense T2-weighted signal in unchanged pattern compared to the recent study. No age-advanced or lobar predominant atrophy. No chronic microhemorrhage or superficial siderosis. No contrast-enhancing lesions. Vascular: Major intracranial arterial and venous sinus flow voids are preserved. Skull and upper cervical spine: The visualized skull base, calvarium, upper cervical spine and extracranial soft tissues are normal. MRI ORBITS FINDINGS Orbits: --Globes: Normal. --Bony orbit: Normal. --Preseptal soft tissues: Normal. --Intra- and extraconal orbital fat: Normal. No inflammatory stranding. --Optic nerves: Normal. --Lacrimal glands and fossae: Normal. --Extraocular muscles: Normal. Visualized sinuses:  No fluid levels or advanced mucosal thickening. Soft tissues: Normal. IMPRESSION: 1. Unchanged appearance of bilateral posterior periventricular predominant Dawn matter disease. No contrast-enhancing lesions. 2. Normal orbits. Electronically Signed   By: Ulyses Jarred M.D.   On: 05/26/2017 22:15   Mr Rosealee Albee KZ Contrast  Result Date: 05/26/2017 CLINICAL DATA:  Dizziness and blurry vision. EXAM: MRI HEAD AND ORBITS WITHOUT  AND WITH CONTRAST TECHNIQUE: Multiplanar, multiecho pulse sequences of the brain and surrounding structures were obtained without and with intravenous contrast. Multiplanar, multiecho pulse sequences of the orbits and surrounding structures were obtained including fat saturation techniques, before and after intravenous contrast administration. CONTRAST:  22mL MULTIHANCE GADOBENATE DIMEGLUMINE 529 MG/ML IV SOLN COMPARISON:  Brain MRI 05/23/2017 FINDINGS: MRI HEAD FINDINGS Brain: The midline structures are normal. There is no acute infarct or acute hemorrhage. No mass lesion, hydrocephalus, dural abnormality or extra-axial collection. There is posterior predominant periventricular hyperintense T2-weighted signal in unchanged pattern compared to the recent study. No age-advanced or lobar predominant atrophy. No chronic microhemorrhage or superficial siderosis. No contrast-enhancing lesions. Vascular: Major intracranial arterial and venous sinus flow voids are preserved. Skull and upper cervical spine: The visualized skull base, calvarium, upper cervical spine and extracranial soft tissues are normal. MRI ORBITS FINDINGS Orbits: --Globes: Normal. --Bony orbit: Normal. --Preseptal soft tissues: Normal. --Intra- and extraconal orbital fat: Normal. No inflammatory stranding. --Optic nerves: Normal. --Lacrimal glands and fossae: Normal. --Extraocular muscles: Normal. Visualized sinuses:  No fluid levels or advanced mucosal thickening. Soft tissues: Normal. IMPRESSION: 1. Unchanged appearance of bilateral posterior periventricular predominant Dawn matter disease. No contrast-enhancing lesions. 2. Normal orbits. Electronically Signed   By: Ulyses Jarred M.D.   On: 05/26/2017 22:15    Procedures Procedures (including critical care time)  Medications Ordered in ED Medications  methylPREDNISolone sodium succinate (SOLU-MEDROL) 1,000 mg in sodium chloride 0.9 % 50 mL IVPB (1,000 mg Intravenous New Bag/Given 05/26/17 2343)   gadobenate dimeglumine (MULTIHANCE) injection 20 mL (20 mLs Intravenous Contrast Given 05/26/17 2155)     Initial Impression / Assessment and Plan / ED Course  I have reviewed the triage vital signs and the nursing notes.  Pertinent labs & imaging results that were available during my care of the patient were reviewed by me and considered in my medical decision making (see chart for details).     Dawn Peters is a 37 y.o. female with a past medical history significant for multiple sclerosis, anxiety, and hypertension with recent admission for MS flare several weeks ago who presents with new neurologic complaints.  Patient reports that last week she was admitted for leg weakness that was  likely related to MS flare.  Patient received steroids and is now at [facility] skilled nursing facility for further management and rehab.  She reports that she has had continued lower extremity bilateral weakness and had an MRI done several days ago that did not show new changes.  She reports that last night she began having some dizziness and lightheadedness before going to bed and then this morning during breakfast around 8 to 8:30 AM started having double vision.  She reports that she is having diplopia and blurry vision bilaterally which she is never had before.  She reports some continued sleepiness, lightheadedness, and mild dizziness.  She denies fever, chills, headache, neck pain, nausea, vomiting, conservation, diarrhea, dysuria.  She denies any cough, congestion, or URI symptoms.  She denies any recent trauma.  She is primarily concerned about the vision changes that are new today.  She reports continued leg weakness bilaterally and cannot lift them off the bed.  On exam, patient has nystagmus in both eyes in all directions.  Patient also has evidence of misalignment with alternating eye covering shows eye jumping.  Patient had no facial droop and no numbness of face.  Normal sensation throughout.   Patient had some ataxia bilaterally with finger nose finger testing.  Patient had normal sensation of the legs but had weakness in both legs.  Patient could barely move them off of the bed bilaterally.  Patient had no abdominal tenderness or chest tenderness.  Lungs clear.  Neurology was called given her recent admission for MS flare and MRI several days ago that was unchanged.  They report that given these new visual changes, patient will need MRI of the brain and orbits with and without contrast to further evaluate for demyelination or MS lesions.  They report that if there is no  new abnormality seen, patient will need to follow-up with her MS specialist as they do not feel repeat steroids would be beneficial given her recent admission for steroids.  If abnormalities are seen, they would recommend admission for further management.  MRI showed no acute changes and specifically no evidence of new MS lesions in her brain or orbits.  Unclear etiology of her new diplopia and vision changes.  Neurology was recalled and they suggested giving the patient 1 more dose of IV Solu-Medrol at 1 g.  They also recommended getting a urinalysis to look for occult infection that may have unmasked her previous sites of MS flare.  Urinalysis is reassuring.  CBC and CMP also reassuring.  They recommended patient be discharged to the care of her MS specialist whom she can call tomorrow   As she already goes to a nursing facility where she can get PT and OT evaluation.  They did not feel she needed admission.  Patient reported that her vision changes had slightly improved during her ED stay.  Patient will be discharged for outpatient management of her vision changes and other problems.    Patient will be discharged for outpatient management of her vision changes and MS.   Final Clinical Impressions(s) / ED Diagnoses   Final diagnoses:  Dizziness  Multiple sclerosis ([place])  Diplopia  Blurry vision    ED Discharge  Orders    None      Clinical Impression: 1. Dizziness   2. Multiple sclerosis ([place])   3. Diplopia   4. Blurry vision     Disposition: Discharge  Condition: Good  I have discussed the results, Dx and Tx plan with the pt(&  family if present). He/she/they expressed understanding and agree(s) with the plan. Discharge instructions discussed at great length. Strict return precautions discussed and pt &/or family have verbalized understanding of the instructions. No further questions at time of discharge.    New Prescriptions   No medications on file    Follow Up: Britt Bottom, MD [place] [address] DEPT [place] [id number] [place]          Rianna Lukes, Gwenyth Allegra, MD 05/27/17 1049

## 2017-05-26 NOTE — ED Notes (Signed)
Pt transported to MRI 

## 2017-05-27 NOTE — ED Notes (Signed)
Atempted to call report to [facility] Placed on extended hold unable to given report at this time

## 2017-05-28 LAB — URINE CULTURE

## 2017-05-30 ENCOUNTER — Telehealth: Payer: Self-pay | Admitting: Neurology

## 2017-05-30 NOTE — Telephone Encounter (Signed)
error 

## 2017-05-30 NOTE — Telephone Encounter (Signed)
I spoke with RAS, reviewed pt's chart with him, and called Kaylene back..  Extended, pleasant conversation. Explained that MRI has not shown new MS changes, but that if she is unhappy on [place], he will discuss other tx. options with her. She voiced her belief that [name] is causing her mobility problems.  I have explained [name] has not caused mobility issues.  She would like IV steroids. She had 3 days of IV steroids during recent hospitalization (Less than 3 wks ago.) Again have offered appt. with RAS to discuss other tx. options, but have reinforced to pt. that RAS would like for her to continue rehab/PT, but he does not feel that continued high dose steroids are in her best interest./fim

## 2017-05-30 NOTE — Telephone Encounter (Signed)
Pt called stating she is at the [facility] for rehab(unable to get the name) stating she is unable to walk without assistance. Pt states she may have to go back to the ED for steroids . Pt requesting a call back as soon as RN is available

## 2017-06-05 NOTE — Telephone Encounter (Signed)
Spoke with Dawn Peters and reminded her that RAS felt comfortable with her switching back to Tecfidera if she likes.  Again explained that [name] has not caused a relpase, as the other medications will not cause relapses.  Explained that she may have progression of sx., or good and bad sx. days with all dmt's.  She verbalized understanding that the dmt's will not cause a relapse, but sts. she feels she will do better on a daily medication.  Advised Tecfidera srf is still up front [facility] for her to sign--once signed, we can send it to [place].  She verbalized understanding of same/fim

## 2017-06-05 NOTE — Telephone Encounter (Signed)
Pt called stating she is wanting to try a new medication, stating she is unhappy with [place]. Pt wanting a medication that wont cause her to have relapse. Please call to advise

## 2017-06-06 NOTE — Telephone Encounter (Signed)
Noted/fim 

## 2017-06-06 NOTE — Telephone Encounter (Signed)
Pt called stating she would like the paper she needs to sign(for Tecfidera) faxed to her, when asked for a fax # she couldn't provide one. Pt states a nurse from the nursing home she is currently at is going to reach out to RN to discuss this further. FYI

## 2017-06-07 ENCOUNTER — Telehealth: Payer: Self-pay | Admitting: Neurology

## 2017-06-07 NOTE — Telephone Encounter (Signed)
[facility] home calling requesting a call back to discuss Ampyra please contact at [phone] ask to speak to the working nurse on [address]

## 2017-06-07 NOTE — Telephone Encounter (Signed)
Spoke with Dawn Peters and gave v/o for Dalfampridine 10mg  po bid.  Pt. has rx. and r/f available at [facility]/fim

## 2017-06-21 ENCOUNTER — Other Ambulatory Visit: Payer: Self-pay

## 2017-06-21 ENCOUNTER — Ambulatory Visit (INDEPENDENT_AMBULATORY_CARE_PROVIDER_SITE_OTHER): Payer: Medicare Other | Admitting: Neurology

## 2017-06-21 ENCOUNTER — Encounter: Payer: Self-pay | Admitting: Neurology

## 2017-06-21 VITALS — BP 128/94 | HR 108 | Resp 18 | Ht 65.0 in

## 2017-06-21 DIAGNOSIS — N179 Acute kidney failure, unspecified: Secondary | ICD-10-CM | POA: Diagnosis not present

## 2017-06-21 DIAGNOSIS — G8929 Other chronic pain: Secondary | ICD-10-CM | POA: Diagnosis not present

## 2017-06-21 DIAGNOSIS — R5383 Other fatigue: Secondary | ICD-10-CM | POA: Diagnosis not present

## 2017-06-21 DIAGNOSIS — N1330 Unspecified hydronephrosis: Secondary | ICD-10-CM

## 2017-06-21 DIAGNOSIS — N189 Chronic kidney disease, unspecified: Secondary | ICD-10-CM | POA: Diagnosis not present

## 2017-06-21 DIAGNOSIS — G35 Multiple sclerosis: Secondary | ICD-10-CM | POA: Diagnosis not present

## 2017-06-21 DIAGNOSIS — R269 Unspecified abnormalities of gait and mobility: Secondary | ICD-10-CM | POA: Diagnosis not present

## 2017-06-21 DIAGNOSIS — Z79899 Other long term (current) drug therapy: Secondary | ICD-10-CM | POA: Diagnosis not present

## 2017-06-21 DIAGNOSIS — N289 Disorder of kidney and ureter, unspecified: Secondary | ICD-10-CM

## 2017-06-21 NOTE — Progress Notes (Addendum)
[facility]  PATIENT: Dawn Peters DOB: [birth year]  REFERRING DOCTOR OR PCP:  Dr. Annitta Needs SOURCE: Patient, records in EMR, lab results and radiology reports in EMR, MRI images reviewed on PACS.  _________________________________   HISTORICAL  CHIEF COMPLAINT:  Chief Complaint  Patient presents with  . Multiple Sclerosis    2nd yr. Lemtrada infusions were Aug. 7, 8, 9 of 2018. Increased difficulty walking.  Currently receiving inpt. PT at [facility]./fim    HISTORY OF PRESENT ILLNESS:   Dawn Peters is a 37 year-old woman who was diagnosed with MS in 2000.     Update 06/21/2017: She has an active form of SPMS.  She re-presented with worsening left sided weakness and more trouble walking a few days after her last visit.  Imaging studies did not show any new lesions.  She was given IV solu-medrol but did not feel she got any better.   She then was d/c to [facility] for Rehab.     Her last Lemtrada was 10/10/2016-10/12/16.      She did not follow up with nephrology appointment scheduled for March.   Creatinine has been increased.   During last admission, she was evaluated by medicine and received IV fluids and creatinine improved to 1.39-1.41.  One year ago creatinine was 0.9.      Patients with [name] can have anti-glomerular membrane antibodies and this was checked 05/17/2017 and was normal.   There also have been a few cases of other glomerulonephropathy seen in clinical studies and post-marketing data.     She is not on any NSAID's.   Lab and imaging reports, MRI images on PACS and admission discharge notes from her recent admission were reviewed  Update 05/17/2017:   She had a second course of Lemtrada in August 2018.   She notes her left leg is worse.  It is weaker and she is falling more.  She was in the hospital last month due to hypersomnia and generalized weakness. No definite etiology was determined. Drug screen was negative. Urinalysis showed small  amount of hemoglobin but no protein or casts.   Repeat brain MRI showed no new lesions.   She received 3 days of IV Solu-Medrol.  Admission and discharge notes were reviewed.Her creatinine has been elevated with the last reading 1.52.    She is scheduled to see nephrology next week.  She also has apparent hydronephrosis on MRI and I referred her to urology last October but she does not recall going.   She is on Ampyra and feels it helps her strength some.     Update 12/28/2016:   She had her 3 day course of Lemtrada in August. She tolerated it well. She has not had any exacerbations while on Lemtrada for the MS. Her last MRI was in June 2018 and did not show any new lesions. She continues to have difficulty with her gait due to the left leg weakness and spasticity. An AFO initially helped her walk but she is having trouble with fit that she believes is worse due to weight gain. Balance is also mildly off. Spasticity is predominantly in the left leg she gets some benefit with baclofen and tizanidine.    She has painful dysesthesias of the left leg helped partially by gabapentin and lamotrigine. She also sees pain management for opiates. She has worsening urinary hesitancy this year and an MRI of the thoracic spine showed hydronephrosis. She has fatigue is both physical and cognitive. It is  worse in the heat so she feels it is doing better than a couple months ago. Well most nights. Mood has done better with citalopram and BuSpar.  ' From 10/24/2016: MS:   She had her 3 day course of Lemtrada in the office a couple weeks ago and thee infusions went well. . She is JCV high titer positive and had switched from Tysabri to [place] earlier.     Gait/strength:    Gait has worsened due mostly to the left leg weakness and spasticity over the last year. She got her left AFO and is starting to get used to it.   She has trouble lifting the left leg/foot.  Ba;lance is off.     She is on Ampyra with some benefit.  Arms  are doing ok.  She has left sided spasticity, only partially helped by baclofen and tizanidine.  Dysesthesia:   Patient continues to report painful left leg dysesthesias. These are similar to the last visit.  .  She is currently on gabapentin 800 mg by mouth 4 times a day and lamotrigine 200 mg twice a day for the dysesthetic pain.  With her medications and tramadol she feels that the pain is tolerable.   Vision:    She denies any MS related visual problems.  Bladder:   Urinary hesitancy continues to be a problem. She gets some benefit from tamsulosin. MRI of the thoracic spine with recent hospitalization shows hydronephrosis on the left.  She has not yet been referred to urology.  Bowel is fine..  Fatigue/sleep:   She is noting more trouble with her fatigue, no occurring daily.  . She does especially poorly in the hear.  She denies sleepiness -- issues are more fatigue.   She has never tried a stimulant.   She sleeps well most nights.  Mood:  Her depression and anxiety is better with citalopram 40 mg and buspar bid.    Anxiety is more of a problem than depression and she feels that the medications are not completely controlling this.  Cognition:   She denies significant difficulties with her cognitive abilities.   Focus is mildly reduced.  MS history:   She was diagnosed with MS in 2000 after presenting with gait difficulties, numbness and headaches. An MRI of the brain was consistent with MS. She was then started on Betaseron. She had difficulty tolerating Betaseron and at some point switched to Novantrone. She took Novantrone every 3 months for a year or so. A little later, she was placed on Tysabri and she stayed on it for a couple of years. However, she was JCV-positive and switched off. She did well on Tysabri. She had been on Tecfidera since 2015. Her MRI from June 2015 showed that there had been no changes compared to an MRI from 2014.   However, she had an exacerbation March 2017.   The  08/07/2013 MRI of the brain shows multiple T2/FLAIR hyperintense foci located in the cerebellum, middle cerebellar peduncles, pons and in the periventricular white matter of the hemispheres in a pattern and configuration consistent with chronic demyelinating plaque associated with MS. There were no acute findings. There was no change compared to the previous MRI from 01/16/2013 that was also reviewed. An MRI of the cervical spine shows subtle T2 hyperintense signal within the cord and there were no acute findings.  REVIEW OF SYSTEMS: Constitutional: No fevers, chills, sweats, or change in appetite.   Notes fatigue, some insomnia Eyes: No visual changes, double vision, eye  pain Ear, nose and throat: No hearing loss, ear pain, nasal congestion, sore throat Cardiovascular: No chest pain, palpitations Respiratory: No shortness of breath at rest or with exertion.   No wheezes GastrointestinaI: No nausea, vomiting, diarrhea, abdominal pain, fecal incontinence Genitourinary: No dysuria.   She has hesitancy. Sometimes she has difficulty emptying completely Flomax has helped urinary hesitancy. 1 x nocturia. Musculoskeletal: as above Integumentary: No rash, pruritus, skin lesions Neurological: as above Psychiatric: Mild depression at this time.  Moderate anxiety Endocrine: No palpitations, diaphoresis, change in appetite, change in weigh or increased thirst Hematologic/Lymphatic: No anemia, purpura, petechiae. Allergic/Immunologic: No itchy/runny eyes, nasal congestion, recent allergic reactions, rashes  ALLERGIES: Allergies  Allergen Reactions  . Ace Inhibitors Swelling  . Lisinopril Swelling    Lower lip swelling    HOME MEDICATIONS:  Current Outpatient Medications:  .  amitriptyline (ELAVIL) 25 MG tablet, TAKE ONE OR TWO AT BEDTIME (Patient taking differently: Take 1 tablet by mouth daily), Disp: 60 tablet, Rfl: 9 .  amLODipine (NORVASC) 5 MG tablet, Take 1 tablet (5 mg total) by mouth  daily., Disp: 90 tablet, Rfl: 0 .  amphetamine-dextroamphetamine (ADDERALL XR) 20 MG 24 hr capsule, Take 1 capsule (20 mg total) by mouth daily., Disp: 30 capsule, Rfl: 0 .  baclofen (LIORESAL) 20 MG tablet, Take 1 tablet (20 mg total) by mouth 3 (three) times daily as needed (for ms). (Patient taking differently: Take 20 mg by mouth 3 (three) times daily. ), Disp: 90 each, Rfl: 11 .  busPIRone (BUSPAR) 15 MG tablet, Take 1 tablet (15 mg total) by mouth 2 (two) times daily., Disp: 60 tablet, Rfl: 11 .  citalopram (CELEXA) 40 MG tablet, Take 1 tablet (40 mg total) by mouth daily., Disp: 90 tablet, Rfl: 3 .  lamoTRIgine (LAMICTAL) 200 MG tablet, Take 1 tablet (200 mg total) by mouth 2 (two) times daily., Disp: 60 tablet, Rfl: 11 .  Oxcarbazepine (TRILEPTAL) 300 MG tablet, Take 1 tablet (300 mg total) by mouth 2 (two) times daily., Disp: 60 tablet, Rfl: 1 .  oxyCODONE-acetaminophen (PERCOCET/ROXICET) 5-325 MG tablet, Take 1 tablet by mouth 4 (four) times daily., Disp: 30 tablet, Rfl: 0 .  pregabalin (LYRICA) 200 MG capsule, Take 1 capsule (200 mg total) by mouth 2 (two) times daily., Disp: 60 capsule, Rfl: 5 .  SUMAtriptan (IMITREX) 100 MG tablet, Take 1 tablet (100 mg total) by mouth once as needed for migraine. May repeat in 2 hours if headache persists or recurs., Disp: 10 tablet, Rfl: 3 .  tamsulosin (FLOMAX) 0.4 MG CAPS capsule, Take 0.4 mg by mouth daily., Disp: , Rfl: 11 .  tiZANidine (ZANAFLEX) 4 MG tablet, TAKE 1 TABLET BY MOUTH AT BEDTIME, Disp: 30 tablet, Rfl: 11 .  valACYclovir (VALTREX) 500 MG tablet, Take 1 tablet (500 mg total) by mouth 2 (two) times daily., Disp: 60 tablet, Rfl: 2 .  metoprolol tartrate (LOPRESSOR) 25 MG tablet, Take 0.5 tablets (12.5 mg total) by mouth 2 (two) times daily. (Patient taking differently: Take 25 mg by mouth 2 (two) times daily. ), Disp: 30 tablet, Rfl: 0  PAST MEDICAL HISTORY: Past Medical History:  Diagnosis Date  . Anxiety   . Depression   .  Hypertension   . MS (multiple sclerosis) ([place])     PAST SURGICAL HISTORY: Past Surgical History:  Procedure Laterality Date  . BILATERAL HIP ARTHROSCOPY Left 07/2013  . JOINT REPLACEMENT Bilateral     R 2004 and L 2005    FAMILY HISTORY: Family  History  Problem Relation Age of Onset  . Healthy Mother   . Healthy Father   . Breast cancer Unknown        paternal grandmother dx age 21  . Colon cancer Unknown        paternal grandmother  . Hypertension Other   . Diabetes Other     SOCIAL HISTORY:  Social History   Socioeconomic History  . Marital status: Single    Spouse name: Not on file  . Number of children: Not on file  . Years of education: Not on file  . Highest education level: Not on file  Occupational History  . Not on file  Social Needs  . Financial resource strain: Not on file  . Food insecurity:    Worry: Not on file    Inability: Not on file  . Transportation needs:    Medical: Not on file    Non-medical: Not on file  Tobacco Use  . Smoking status: Never Smoker  . Smokeless tobacco: Never Used  Substance and Sexual Activity  . Alcohol use: No  . Drug use: No  . Sexual activity: Not Currently    Birth control/protection: Pill  Lifestyle  . Physical activity:    Days per week: Not on file    Minutes per session: Not on file  . Stress: Not on file  Relationships  . Social connections:    Talks on phone: Not on file    Gets together: Not on file    Attends religious service: Not on file    Active member of club or organization: Not on file    Attends meetings of clubs or organizations: Not on file    Relationship status: Not on file  . Intimate partner violence:    Fear of current or ex partner: Not on file    Emotionally abused: Not on file    Physically abused: Not on file    Forced sexual activity: Not on file  Other Topics Concern  . Not on file  Social History Narrative  . Not on file     PHYSICAL EXAM  Vitals:   06/21/17 1552    BP: (!) 128/94  Pulse: (!) 108  Resp: 18  Height: 5\' 5"  (1.651 m)    Body mass index is 31.62 kg/m.   General: The patient is well-developed and well-nourished and in no acute distress.  Musculoskeletal:   She has good neck ROM.    Ext: She has mild edema at the ankles.   Neurologic Exam  Mental status: The patient is alert and oriented x 3 at the time of the examination. The patient has apparent normal recent and remote memory, with mildly reduced attention span and concentration ability.   Speech is normal.  Cranial nerves: Extraocular movements are full.  Facial strength and sensation is normal. Trapezius strength is normal.  The tongue is midline, and the patient has symmetric elevation of the soft palate. No obvious hearing deficits are noted.  Motor:  Muscle bulk is normal.   Muscle tone is increased in the left leg.. Strength is  5 / 5 in all 4 extremities.   Sensory: Touch and vibration sensation is normal in the arms or legs.  Coordination: Cerebellar testing reveals good finger-nose-finger .   Right heel to shin is mildly reduced.  She is unable to do left heel to shin.  Gait and station: She needs assistance to stand.  Reflexes: Deep tendon reflexes are increased at the  knees, left greater than right. There is spread bilaterally. She has nonsustained clonus at the ankles, left greater than right.     DIAGNOSTIC DATA (LABS, IMAGING, TESTING) - I reviewed patient records, labs, notes, testing and imaging myself where available.  Lab Results  Component Value Date   WBC 6.7 05/26/2017   HGB 12.2 05/26/2017   HCT 37.0 05/26/2017   MCV 99.7 05/26/2017   PLT 219 05/26/2017      Component Value Date/Time   NA 140 05/26/2017 1852   NA 142 05/17/2017 1242   K 4.0 05/26/2017 1852   CL 102 05/26/2017 1852   CO2 30 05/26/2017 1852   GLUCOSE 95 05/26/2017 1852   BUN 24 (H) 05/26/2017 1852   BUN 28 (H) 05/17/2017 1242   CREATININE 1.41 (H) 05/26/2017 1852    CREATININE 0.85 07/03/2014 0959   CALCIUM 8.9 05/26/2017 1852   PROT 6.6 05/26/2017 1852   PROT 7.2 06/03/2015 1635   ALBUMIN 3.5 05/26/2017 1852   ALBUMIN 4.4 06/03/2015 1635   AST 28 05/26/2017 1852   ALT 19 05/26/2017 1852   ALKPHOS 74 05/26/2017 1852   BILITOT 0.3 05/26/2017 1852   BILITOT 0.3 06/03/2015 1635   GFRNONAA 47 (L) 05/26/2017 1852   GFRNONAA 88 07/03/2014 0959   GFRAA 54 (L) 05/26/2017 1852   GFRAA >89 07/03/2014 0959   Lab Results  Component Value Date   CHOL 228 (H) 04/20/2017   HDL 77 04/20/2017   LDLCALC 142 (H) 04/20/2017   TRIG 46 04/20/2017   CHOLHDL 3.0 04/20/2017   Lab Results  Component Value Date   HGBA1C 5.3 04/20/2017   No results found for: VITAMINB12 Lab Results  Component Value Date   TSH 1.970 04/19/2016      ASSESSMENT AND PLAN  Multiple sclerosis ([place]) - Plan: Comprehensive metabolic panel, CBC with Differential/Platelet, TSH, Urinalysis, Routine w reflex microscopic  Hydronephrosis of left kidney  Acute kidney injury superimposed on CKD (HCC)  Mild renal insufficiency  Gait disturbance  Other fatigue  High risk medication use  Other chronic pain   1.    We had another discussion about her MS.   She has features of both RRMS and SPMS so would be classified as 'active' SPMS.   Unfortunately, she has had slow progression despite Tysabri and more recently [place], both very efficacious medications.  I discussed with her again that in general, the disease modifying therapies are much better at preventing new lesions on MRI and new relapses than they are for progression of disability.  I am not certain that her recent episode was an exacerbation.  She has had at least a half dozen times where she has gone to the emergency room and received a course of steroids but has not had any new lesions on MRI.  She seems to be getting some benefit from rehab.  2.    She has had increasing creatinine the past few months.  Unfortunately with  her hospitalization and then rehab, she had not followed up with nephrology and is advised to do so.  I will check another creatinine and urinalysis today so that those lab results will be available for her next appointment.  The anti-glomerular basement membrane was negative.    MRI lumbar has shown hydronephrosis, as well and she has seen urology in the past 3.    Continue medications for anxiety, pain and spasticity.   Add amantadine to see if that will help her gait (currently in clinical studies  as a time release medication for ambulation and MS) 4.    Return to see me in 3 months or sooner if there are new or worsening neurologic symptoms.  40 minutes face-to-face evaluation with greater than one half the time counseling coordinating care about her stage of MS and and ambulation issues  Jenner Rosier A. Felecia Shelling, MD, PhD 7/94/3276, 1:47 PM Certified in Neurology, Clinical Neurophysiology, Sleep Medicine, Pain Medicine and Neuroimaging  [facility] [address], [place], [place] [address] [phone] ]

## 2017-06-22 ENCOUNTER — Telehealth: Payer: Self-pay | Admitting: Neurology

## 2017-06-22 LAB — CBC WITH DIFFERENTIAL/PLATELET
Basophils Absolute: 0 10*3/uL (ref 0.0–0.2)
Basos: 1 %
EOS (ABSOLUTE): 0.1 10*3/uL (ref 0.0–0.4)
EOS: 3 %
HEMATOCRIT: 36 % (ref 34.0–46.6)
Hemoglobin: 11.9 g/dL (ref 11.1–15.9)
IMMATURE GRANULOCYTES: 1 %
Immature Grans (Abs): 0.1 10*3/uL (ref 0.0–0.1)
LYMPHS: 19 %
Lymphocytes Absolute: 0.8 10*3/uL (ref 0.7–3.1)
MCH: 32.3 pg (ref 26.6–33.0)
MCHC: 33.1 g/dL (ref 31.5–35.7)
MCV: 98 fL — ABNORMAL HIGH (ref 79–97)
Monocytes Absolute: 0.5 10*3/uL (ref 0.1–0.9)
Monocytes: 13 %
Neutrophils Absolute: 2.6 10*3/uL (ref 1.4–7.0)
Neutrophils: 63 %
Platelets: 337 10*3/uL (ref 150–379)
RBC: 3.68 x10E6/uL — AB (ref 3.77–5.28)
RDW: 17.4 % — ABNORMAL HIGH (ref 12.3–15.4)
WBC: 4.1 10*3/uL (ref 3.4–10.8)

## 2017-06-22 LAB — COMPREHENSIVE METABOLIC PANEL
ALBUMIN: 4 g/dL (ref 3.5–5.5)
ALK PHOS: 104 IU/L (ref 39–117)
ALT: 26 IU/L (ref 0–32)
AST: 26 IU/L (ref 0–40)
Albumin/Globulin Ratio: 1.6 (ref 1.2–2.2)
BUN/Creatinine Ratio: 12 (ref 9–23)
BUN: 16 mg/dL (ref 6–20)
Bilirubin Total: <0.2 mg/dL (ref 0.0–1.2)
CO2: 24 mmol/L (ref 20–29)
CREATININE: 1.32 mg/dL — AB (ref 0.57–1.00)
Calcium: 9.4 mg/dL (ref 8.7–10.2)
Chloride: 105 mmol/L (ref 96–106)
GFR calc Af Amer: 59 mL/min/{1.73_m2} — ABNORMAL LOW (ref >59)
GFR calc non Af Amer: 51 mL/min/{1.73_m2} — ABNORMAL LOW (ref >59)
GLUCOSE: 82 mg/dL (ref 65–99)
Globulin, Total: 2.5 g/dL (ref 1.5–4.5)
Potassium: 4.5 mmol/L (ref 3.5–5.2)
Sodium: 144 mmol/L (ref 134–144)
Total Protein: 6.5 g/dL (ref 6.0–8.5)

## 2017-06-22 LAB — URINALYSIS, ROUTINE W REFLEX MICROSCOPIC
BILIRUBIN UA: NEGATIVE
Glucose, UA: NEGATIVE
KETONES UA: NEGATIVE
LEUKOCYTES UA: NEGATIVE
NITRITE UA: NEGATIVE
Protein, UA: NEGATIVE
RBC UA: NEGATIVE
SPEC GRAV UA: 1.022 (ref 1.005–1.030)
Urobilinogen, Ur: 0.2 mg/dL (ref 0.2–1.0)
pH, UA: 6.5 (ref 5.0–7.5)

## 2017-06-22 LAB — TSH: TSH: 1.25 u[IU]/mL (ref 0.450–4.500)

## 2017-06-22 NOTE — Telephone Encounter (Signed)
Called the pt to review this result. No answer. LVM for the pt to call back.

## 2017-06-22 NOTE — Telephone Encounter (Signed)
-----   Message from Britt Bottom, MD sent at 06/22/2017  9:44 AM EDT ----- Please let her know that the creatinine is better than it was when she was in the hospital but is still a little high.  She needs to keep the nephrology appointment

## 2017-06-22 NOTE — Telephone Encounter (Signed)
Pt has returned the call to RN Casey, she is asking for a call back °

## 2017-06-22 NOTE — Telephone Encounter (Signed)
Called the pt to make her aware of the lab work and gave her the lab values. The pt verbalized understanding and will keep her apt with nephrology.

## 2017-07-03 ENCOUNTER — Other Ambulatory Visit: Payer: Self-pay | Admitting: Urology

## 2017-07-03 DIAGNOSIS — N130 Hydronephrosis with ureteropelvic junction obstruction: Secondary | ICD-10-CM

## 2017-07-12 ENCOUNTER — Encounter (HOSPITAL_COMMUNITY)
Admission: RE | Admit: 2017-07-12 | Discharge: 2017-07-12 | Disposition: A | Discharge status: Home / Self Care | Payer: Medicare Other | Source: Ambulatory Visit | Attending: Urology | Admitting: Urology

## 2017-07-12 DIAGNOSIS — N130 Hydronephrosis with ureteropelvic junction obstruction: Secondary | ICD-10-CM | POA: Diagnosis not present

## 2017-07-12 MED ORDER — FUROSEMIDE 10 MG/ML IJ SOLN
INTRAMUSCULAR | Status: AC
  Filled 2017-07-12: qty 8

## 2017-07-12 MED ORDER — FUROSEMIDE 10 MG/ML IJ SOLN
40.0000 mg | Freq: Once | INTRAMUSCULAR | Status: AC
  Administered 2017-07-12: 49 mg via INTRAVENOUS

## 2017-07-12 MED ORDER — TECHNETIUM TC 99M MERTIATIDE
5.3000 | Freq: Once | INTRAVENOUS | Status: AC | PRN
  Administered 2017-07-12: 5.3 via INTRAVENOUS

## 2017-07-27 ENCOUNTER — Encounter: Payer: Self-pay | Admitting: Family Medicine

## 2017-08-01 ENCOUNTER — Telehealth: Payer: Self-pay | Admitting: Neurology

## 2017-08-01 ENCOUNTER — Other Ambulatory Visit: Payer: Self-pay | Admitting: Neurology

## 2017-08-01 MED ORDER — AMPHETAMINE-DEXTROAMPHET ER 20 MG PO CP24: 20.0000 mg | ORAL_CAPSULE | Freq: Every day | ORAL | 0 refills | Status: DC

## 2017-08-01 NOTE — Telephone Encounter (Signed)
Pt request refill for amphetamine-dextroamphetamine (ADDERALL XR) 20 MG 24 hr capsule sent to [facility]. [facility]. She also wants to know when she can start tecfidera. Please call to advise

## 2017-08-02 ENCOUNTER — Telehealth: Payer: Self-pay | Admitting: *Deleted

## 2017-08-02 MED ORDER — BUSPIRONE HCL 15 MG PO TABS: 15.0000 mg | ORAL_TABLET | Freq: Two times a day (BID) | ORAL | 11 refills | Status: DC

## 2017-08-02 NOTE — Telephone Encounter (Signed)
Buspar escribed to [facility] in response to faxed request from them/fim

## 2017-08-03 ENCOUNTER — Telehealth: Payer: Self-pay | Admitting: Neurology

## 2017-08-03 MED ORDER — BACLOFEN 20 MG PO TABS: 20.0000 mg | ORAL_TABLET | Freq: Three times a day (TID) | ORAL | 3 refills | Status: DC | PRN

## 2017-08-03 NOTE — Telephone Encounter (Signed)
Baclofen escribed to [facility] as requested/fim

## 2017-08-03 NOTE — Telephone Encounter (Signed)
Pt requesting a new prescription for baclofen (LIORESAL) 20 MG tablet sent to [facility].

## 2017-08-08 ENCOUNTER — Telehealth: Payer: Self-pay | Admitting: *Deleted

## 2017-08-08 NOTE — Telephone Encounter (Signed)
Spoke with Fey this afternoon and explained that I received a fax from [facility] (phone# [phone], fax# [phone]) advising that they made several unsuccessful attempts to contact pt. to schedule delivery of Tecfidera. Tiyonna acknowledged this, stating it is probably b/c she doesn't answer her phone for #'s she doesn't recognize, due to potential scam calls.  I explained she needs to call them at the above phone# to arrange Tecfidera delivery.  She verbalized understanding of same, sts. will call [facility] today/fim

## 2017-08-15 ENCOUNTER — Telehealth: Payer: Self-pay | Admitting: Neurology

## 2017-08-15 NOTE — Telephone Encounter (Signed)
Pt is asking for a call with the telephone # for the specialty pharmacy to use to get back on her Tecfidera

## 2017-08-15 NOTE — Telephone Encounter (Signed)
Spoke with Dawn Peters.  See 6/4 phone note. She did not call [facility] to sched. shipment of Tecfidera.  I have given her the phone# again. [phone], and she sts. she will call now/fim

## 2017-08-18 ENCOUNTER — Other Ambulatory Visit: Payer: Self-pay | Admitting: Neurology

## 2017-08-18 MED ORDER — DALFAMPRIDINE ER 10 MG PO TB12: 10.0000 mg | ORAL_TABLET | Freq: Two times a day (BID) | ORAL | 3 refills | Status: DC

## 2017-08-18 NOTE — Telephone Encounter (Signed)
Ampyra was not showing as an active medication.  I called the patient and she confirmed she was taking it.  Per vo by Dr. Felecia Shelling, ok to send in the prescription.

## 2017-08-18 NOTE — Telephone Encounter (Signed)
Rx sent to [facility] (specialty pharmacy who also fills her Tecfidera).

## 2017-08-18 NOTE — Telephone Encounter (Signed)
Pt request refill for ampyra sent to specialty pharmacy (she does not know the name).

## 2017-08-30 ENCOUNTER — Encounter: Payer: Self-pay | Admitting: Neurology

## 2017-08-31 ENCOUNTER — Telehealth: Payer: Self-pay | Admitting: *Deleted

## 2017-08-31 NOTE — Telephone Encounter (Signed)
Spoke with Dawn Peters.  She has not had Lemtrada labs drawn in several mos, so I wanted to see what we can do to better ensure her compliance with required lab monitoring.  She verbalized understanding of same, sts. labs weren't drawn by the [place] program when she was in rehab for 2 mos, but there were drawn yesterday, so going forward there shouldn't be a problem./fim

## 2017-09-11 ENCOUNTER — Ambulatory Visit: Payer: Medicare Other | Admitting: Neurology

## 2017-09-12 ENCOUNTER — Other Ambulatory Visit: Payer: Self-pay | Admitting: Physician Assistant

## 2017-09-12 ENCOUNTER — Encounter: Payer: Self-pay | Admitting: Neurology

## 2017-09-12 DIAGNOSIS — Z1231 Encounter for screening mammogram for malignant neoplasm of breast: Secondary | ICD-10-CM

## 2017-09-17 ENCOUNTER — Other Ambulatory Visit: Payer: Self-pay

## 2017-09-17 ENCOUNTER — Encounter (HOSPITAL_COMMUNITY): Payer: Self-pay | Admitting: Emergency Medicine

## 2017-09-17 ENCOUNTER — Observation Stay (HOSPITAL_COMMUNITY)
Admission: EM | Admit: 2017-09-17 | Discharge: 2017-09-19 | Disposition: A | Discharge status: Home / Self Care | Payer: Medicare Other | Attending: Internal Medicine | Admitting: Internal Medicine

## 2017-09-17 DIAGNOSIS — Z7901 Long term (current) use of anticoagulants: Secondary | ICD-10-CM | POA: Diagnosis not present

## 2017-09-17 DIAGNOSIS — Z23 Encounter for immunization: Secondary | ICD-10-CM | POA: Diagnosis not present

## 2017-09-17 DIAGNOSIS — I2699 Other pulmonary embolism without acute cor pulmonale: Secondary | ICD-10-CM

## 2017-09-17 DIAGNOSIS — I129 Hypertensive chronic kidney disease with stage 1 through stage 4 chronic kidney disease, or unspecified chronic kidney disease: Secondary | ICD-10-CM | POA: Insufficient documentation

## 2017-09-17 DIAGNOSIS — N183 Chronic kidney disease, stage 3 (moderate): Secondary | ICD-10-CM | POA: Insufficient documentation

## 2017-09-17 DIAGNOSIS — Z8249 Family history of ischemic heart disease and other diseases of the circulatory system: Secondary | ICD-10-CM | POA: Diagnosis not present

## 2017-09-17 DIAGNOSIS — I82432 Acute embolism and thrombosis of left popliteal vein: Secondary | ICD-10-CM | POA: Insufficient documentation

## 2017-09-17 DIAGNOSIS — G8929 Other chronic pain: Secondary | ICD-10-CM | POA: Diagnosis not present

## 2017-09-17 DIAGNOSIS — I10 Essential (primary) hypertension: Secondary | ICD-10-CM | POA: Diagnosis present

## 2017-09-17 DIAGNOSIS — Z79899 Other long term (current) drug therapy: Secondary | ICD-10-CM | POA: Diagnosis not present

## 2017-09-17 DIAGNOSIS — M79605 Pain in left leg: Secondary | ICD-10-CM

## 2017-09-17 DIAGNOSIS — F418 Other specified anxiety disorders: Secondary | ICD-10-CM | POA: Diagnosis present

## 2017-09-17 DIAGNOSIS — G9340 Encephalopathy, unspecified: Secondary | ICD-10-CM | POA: Diagnosis present

## 2017-09-17 DIAGNOSIS — I82409 Acute embolism and thrombosis of unspecified deep veins of unspecified lower extremity: Secondary | ICD-10-CM

## 2017-09-17 DIAGNOSIS — G35 Multiple sclerosis: Secondary | ICD-10-CM | POA: Diagnosis not present

## 2017-09-17 DIAGNOSIS — I051 Rheumatic mitral insufficiency: Secondary | ICD-10-CM | POA: Insufficient documentation

## 2017-09-17 DIAGNOSIS — G43909 Migraine, unspecified, not intractable, without status migrainosus: Secondary | ICD-10-CM | POA: Diagnosis not present

## 2017-09-17 DIAGNOSIS — Z96643 Presence of artificial hip joint, bilateral: Secondary | ICD-10-CM | POA: Insufficient documentation

## 2017-09-17 DIAGNOSIS — Z86718 Personal history of other venous thrombosis and embolism: Secondary | ICD-10-CM

## 2017-09-17 DIAGNOSIS — I82442 Acute embolism and thrombosis of left tibial vein: Secondary | ICD-10-CM | POA: Diagnosis not present

## 2017-09-17 DIAGNOSIS — Z888 Allergy status to other drugs, medicaments and biological substances status: Secondary | ICD-10-CM | POA: Insufficient documentation

## 2017-09-17 HISTORY — DX: Other pulmonary embolism without acute cor pulmonale: I26.99

## 2017-09-17 HISTORY — DX: Acute embolism and thrombosis of unspecified deep veins of unspecified lower extremity: I82.409

## 2017-09-17 NOTE — ED Triage Notes (Signed)
Pt reports having pain in left lower leg for the last week and concerned with having a  Blood clot in leg due to prior history of blood clot.

## 2017-09-18 ENCOUNTER — Observation Stay (HOSPITAL_BASED_OUTPATIENT_CLINIC_OR_DEPARTMENT_OTHER): Payer: Medicare Other

## 2017-09-18 ENCOUNTER — Other Ambulatory Visit: Payer: Self-pay

## 2017-09-18 ENCOUNTER — Encounter (HOSPITAL_COMMUNITY): Payer: Self-pay | Admitting: Radiology

## 2017-09-18 ENCOUNTER — Emergency Department (HOSPITAL_COMMUNITY): Payer: Medicare Other

## 2017-09-18 DIAGNOSIS — I340 Nonrheumatic mitral (valve) insufficiency: Secondary | ICD-10-CM

## 2017-09-18 DIAGNOSIS — I10 Essential (primary) hypertension: Secondary | ICD-10-CM | POA: Diagnosis not present

## 2017-09-18 DIAGNOSIS — I2699 Other pulmonary embolism without acute cor pulmonale: Secondary | ICD-10-CM | POA: Diagnosis not present

## 2017-09-18 DIAGNOSIS — R600 Localized edema: Secondary | ICD-10-CM

## 2017-09-18 DIAGNOSIS — G8929 Other chronic pain: Secondary | ICD-10-CM | POA: Diagnosis not present

## 2017-09-18 DIAGNOSIS — F418 Other specified anxiety disorders: Secondary | ICD-10-CM | POA: Diagnosis not present

## 2017-09-18 DIAGNOSIS — M79605 Pain in left leg: Secondary | ICD-10-CM | POA: Diagnosis not present

## 2017-09-18 DIAGNOSIS — G9340 Encephalopathy, unspecified: Secondary | ICD-10-CM | POA: Diagnosis not present

## 2017-09-18 LAB — CBC WITH DIFFERENTIAL/PLATELET
BASOS PCT: 0 %
Basophils Absolute: 0 10*3/uL (ref 0.0–0.1)
EOS ABS: 0.2 10*3/uL (ref 0.0–0.7)
EOS PCT: 2 %
HCT: 38.9 % (ref 36.0–46.0)
Hemoglobin: 13.8 g/dL (ref 12.0–15.0)
Lymphocytes Relative: 16 %
Lymphs Abs: 1 10*3/uL (ref 0.7–4.0)
MCH: 34.2 pg — ABNORMAL HIGH (ref 26.0–34.0)
MCHC: 35.5 g/dL (ref 30.0–36.0)
MCV: 96.3 fL (ref 78.0–100.0)
MONO ABS: 0.7 10*3/uL (ref 0.1–1.0)
MONOS PCT: 11 %
Neutro Abs: 4.8 10*3/uL (ref 1.7–7.7)
Neutrophils Relative %: 71 %
PLATELETS: 262 10*3/uL (ref 150–400)
RBC: 4.04 MIL/uL (ref 3.87–5.11)
RDW: 14.6 % (ref 11.5–15.5)
WBC: 6.7 10*3/uL (ref 4.0–10.5)

## 2017-09-18 LAB — BASIC METABOLIC PANEL
Anion gap: 10 (ref 5–15)
BUN: 16 mg/dL (ref 6–20)
CALCIUM: 9.1 mg/dL (ref 8.9–10.3)
CO2: 27 mmol/L (ref 22–32)
CREATININE: 1.44 mg/dL — AB (ref 0.44–1.00)
Chloride: 105 mmol/L (ref 98–111)
GFR, EST AFRICAN AMERICAN: 52 mL/min — AB (ref >60)
GFR, EST NON AFRICAN AMERICAN: 45 mL/min — AB (ref >60)
Glucose, Bld: 92 mg/dL (ref 70–99)
Potassium: 3.4 mmol/L — ABNORMAL LOW (ref 3.5–5.1)
SODIUM: 142 mmol/L (ref 135–145)

## 2017-09-18 LAB — PROTIME-INR
INR: 1.08
PROTHROMBIN TIME: 13.9 s (ref 11.4–15.2)

## 2017-09-18 LAB — ECHOCARDIOGRAM COMPLETE
Height: 65 in
Weight: 3200 oz

## 2017-09-18 LAB — HEPARIN LEVEL (UNFRACTIONATED)
HEPARIN UNFRACTIONATED: 1.05 [IU]/mL — AB (ref 0.30–0.70)
Heparin Unfractionated: 0.96 IU/mL — ABNORMAL HIGH (ref 0.30–0.70)

## 2017-09-18 LAB — APTT: aPTT: 21 seconds — ABNORMAL LOW (ref 24–36)

## 2017-09-18 LAB — D-DIMER, QUANTITATIVE (NOT AT ARMC): D DIMER QUANT: 7.71 ug{FEU}/mL — AB (ref 0.00–0.50)

## 2017-09-18 MED ORDER — METOPROLOL TARTRATE 25 MG PO TABS
12.5000 mg | ORAL_TABLET | Freq: Two times a day (BID) | ORAL | Status: DC
  Administered 2017-09-18 – 2017-09-19 (×3): 12.5 mg via ORAL
  Filled 2017-09-18 (×3): qty 1

## 2017-09-18 MED ORDER — OXYCODONE-ACETAMINOPHEN 5-325 MG PO TABS
1.0000 | ORAL_TABLET | Freq: Four times a day (QID) | ORAL | Status: DC
  Administered 2017-09-18 (×2): 1 via ORAL
  Administered 2017-09-18 – 2017-09-19 (×4): 2 via ORAL
  Filled 2017-09-18: qty 1
  Filled 2017-09-18 (×2): qty 2
  Filled 2017-09-18: qty 1
  Filled 2017-09-18 (×2): qty 2

## 2017-09-18 MED ORDER — PNEUMOCOCCAL VAC POLYVALENT 25 MCG/0.5ML IJ INJ
0.5000 mL | INJECTION | INTRAMUSCULAR | Status: AC
  Administered 2017-09-19: 0.5 mL via INTRAMUSCULAR
  Filled 2017-09-18: qty 0.5

## 2017-09-18 MED ORDER — HYDROXYZINE HCL 10 MG PO TABS
10.0000 mg | ORAL_TABLET | Freq: Three times a day (TID) | ORAL | Status: DC | PRN
  Administered 2017-09-18 – 2017-09-19 (×2): 10 mg via ORAL
  Filled 2017-09-18 (×3): qty 1

## 2017-09-18 MED ORDER — ACETAMINOPHEN 325 MG PO TABS
650.0000 mg | ORAL_TABLET | Freq: Four times a day (QID) | ORAL | Status: DC | PRN
Start: 2017-09-18 — End: 2017-09-18

## 2017-09-18 MED ORDER — LAMOTRIGINE 100 MG PO TABS
200.0000 mg | ORAL_TABLET | Freq: Two times a day (BID) | ORAL | Status: DC
  Administered 2017-09-18 – 2017-09-19 (×3): 200 mg via ORAL
  Filled 2017-09-18 (×3): qty 2

## 2017-09-18 MED ORDER — ONDANSETRON HCL 4 MG PO TABS: 4.0000 mg | ORAL_TABLET | Freq: Four times a day (QID) | ORAL | Status: DC | PRN

## 2017-09-18 MED ORDER — GABAPENTIN 400 MG PO CAPS
800.0000 mg | ORAL_CAPSULE | Freq: Three times a day (TID) | ORAL | Status: DC
  Administered 2017-09-18: 800 mg via ORAL
  Filled 2017-09-18: qty 2

## 2017-09-18 MED ORDER — HEPARIN BOLUS VIA INFUSION
4000.0000 [IU] | Freq: Once | INTRAVENOUS | Status: AC
  Administered 2017-09-18: 4000 [IU] via INTRAVENOUS
  Filled 2017-09-18: qty 4000

## 2017-09-18 MED ORDER — BUSPIRONE HCL 5 MG PO TABS
15.0000 mg | ORAL_TABLET | Freq: Two times a day (BID) | ORAL | Status: DC
  Administered 2017-09-18 – 2017-09-19 (×3): 15 mg via ORAL
  Filled 2017-09-18 (×3): qty 3

## 2017-09-18 MED ORDER — VALACYCLOVIR HCL 500 MG PO TABS
500.0000 mg | ORAL_TABLET | Freq: Two times a day (BID) | ORAL | Status: DC
  Administered 2017-09-18 – 2017-09-19 (×3): 500 mg via ORAL
  Filled 2017-09-18 (×4): qty 1

## 2017-09-18 MED ORDER — BACLOFEN 20 MG PO TABS
20.0000 mg | ORAL_TABLET | Freq: Three times a day (TID) | ORAL | Status: DC
  Administered 2017-09-18 – 2017-09-19 (×5): 20 mg via ORAL
  Filled 2017-09-18 (×5): qty 1

## 2017-09-18 MED ORDER — ONDANSETRON HCL 4 MG/2ML IJ SOLN: 4.0000 mg | Freq: Four times a day (QID) | INTRAMUSCULAR | Status: DC | PRN

## 2017-09-18 MED ORDER — CITALOPRAM HYDROBROMIDE 20 MG PO TABS
20.0000 mg | ORAL_TABLET | Freq: Every day | ORAL | Status: DC
  Administered 2017-09-18 – 2017-09-19 (×2): 20 mg via ORAL
  Filled 2017-09-18 (×2): qty 1

## 2017-09-18 MED ORDER — POTASSIUM CHLORIDE CRYS ER 20 MEQ PO TBCR
40.0000 meq | EXTENDED_RELEASE_TABLET | Freq: Once | ORAL | Status: AC
  Administered 2017-09-18: 40 meq via ORAL
  Filled 2017-09-18: qty 2

## 2017-09-18 MED ORDER — PREGABALIN 100 MG PO CAPS
200.0000 mg | ORAL_CAPSULE | Freq: Two times a day (BID) | ORAL | Status: DC
  Administered 2017-09-18 – 2017-09-19 (×3): 200 mg via ORAL
  Filled 2017-09-18 (×3): qty 2

## 2017-09-18 MED ORDER — AMPHETAMINE-DEXTROAMPHET ER 20 MG PO CP24
20.0000 mg | ORAL_CAPSULE | Freq: Every day | ORAL | Status: DC
  Administered 2017-09-18 – 2017-09-19 (×2): 20 mg via ORAL
  Filled 2017-09-18 (×3): qty 1

## 2017-09-18 MED ORDER — TAMSULOSIN HCL 0.4 MG PO CAPS
0.4000 mg | ORAL_CAPSULE | Freq: Every day | ORAL | Status: DC
  Administered 2017-09-18 – 2017-09-19 (×2): 0.4 mg via ORAL
  Filled 2017-09-18 (×2): qty 1

## 2017-09-18 MED ORDER — ACETAMINOPHEN 650 MG RE SUPP: 650.0000 mg | Freq: Four times a day (QID) | RECTAL | Status: DC | PRN

## 2017-09-18 MED ORDER — SUMATRIPTAN SUCCINATE 50 MG PO TABS
100.0000 mg | ORAL_TABLET | Freq: Once | ORAL | Status: DC | PRN
  Filled 2017-09-18: qty 2

## 2017-09-18 MED ORDER — SODIUM CHLORIDE 0.9 % IV BOLUS
1000.0000 mL | Freq: Once | INTRAVENOUS | Status: AC
  Administered 2017-09-18: 1000 mL via INTRAVENOUS

## 2017-09-18 MED ORDER — ADULT MULTIVITAMIN W/MINERALS CH
1.0000 | ORAL_TABLET | Freq: Every day | ORAL | Status: DC
  Administered 2017-09-18 – 2017-09-19 (×2): 1 via ORAL
  Filled 2017-09-18 (×2): qty 1

## 2017-09-18 MED ORDER — ACETAMINOPHEN 325 MG PO TABS: 650.0000 mg | ORAL_TABLET | Freq: Four times a day (QID) | ORAL | Status: DC | PRN

## 2017-09-18 MED ORDER — AMANTADINE HCL 100 MG PO CAPS
100.0000 mg | ORAL_CAPSULE | Freq: Two times a day (BID) | ORAL | Status: DC
  Administered 2017-09-18 – 2017-09-19 (×3): 100 mg via ORAL
  Filled 2017-09-18 (×4): qty 1

## 2017-09-18 MED ORDER — HYDROMORPHONE HCL 1 MG/ML IJ SOLN
0.5000 mg | Freq: Once | INTRAMUSCULAR | Status: AC
  Administered 2017-09-18: 0.5 mg via INTRAVENOUS
  Filled 2017-09-18: qty 1

## 2017-09-18 MED ORDER — HEPARIN (PORCINE) IN NACL 100-0.45 UNIT/ML-% IJ SOLN
1300.0000 [IU]/h | INTRAMUSCULAR | Status: DC
  Administered 2017-09-18: 1300 [IU]/h via INTRAVENOUS
  Filled 2017-09-18: qty 250

## 2017-09-18 MED ORDER — GABAPENTIN 400 MG PO CAPS: 800.0000 mg | ORAL_CAPSULE | Freq: Three times a day (TID) | ORAL | Status: DC | PRN

## 2017-09-18 MED ORDER — IOPAMIDOL (ISOVUE-370) INJECTION 76%
INTRAVENOUS | Status: AC
  Filled 2017-09-18: qty 100

## 2017-09-18 MED ORDER — HEPARIN (PORCINE) IN NACL 100-0.45 UNIT/ML-% IJ SOLN
1150.0000 [IU]/h | INTRAMUSCULAR | Status: DC
  Administered 2017-09-18: 1150 [IU]/h via INTRAVENOUS

## 2017-09-18 MED ORDER — AMLODIPINE BESYLATE 5 MG PO TABS
5.0000 mg | ORAL_TABLET | Freq: Every day | ORAL | Status: DC
  Administered 2017-09-18: 5 mg via ORAL
  Filled 2017-09-18: qty 1

## 2017-09-18 MED ORDER — AMITRIPTYLINE HCL 25 MG PO TABS
25.0000 mg | ORAL_TABLET | Freq: Every day | ORAL | Status: DC
  Administered 2017-09-18: 25 mg via ORAL
  Filled 2017-09-18: qty 1

## 2017-09-18 MED ORDER — IOPAMIDOL (ISOVUE-370) INJECTION 76%
100.0000 mL | Freq: Once | INTRAVENOUS | Status: AC | PRN
  Administered 2017-09-18: 100 mL via INTRAVENOUS

## 2017-09-18 MED ORDER — TIZANIDINE HCL 4 MG PO TABS
4.0000 mg | ORAL_TABLET | Freq: Every day | ORAL | Status: DC
  Administered 2017-09-18: 4 mg via ORAL
  Filled 2017-09-18: qty 1

## 2017-09-18 MED ORDER — DALFAMPRIDINE ER 10 MG PO TB12: 10.0000 mg | ORAL_TABLET | Freq: Two times a day (BID) | ORAL | Status: DC

## 2017-09-18 MED ORDER — OXCARBAZEPINE 300 MG PO TABS
300.0000 mg | ORAL_TABLET | Freq: Two times a day (BID) | ORAL | Status: DC
  Administered 2017-09-18 – 2017-09-19 (×3): 300 mg via ORAL
  Filled 2017-09-18 (×4): qty 1

## 2017-09-18 MED ORDER — OXYCODONE-ACETAMINOPHEN 5-325 MG PO TABS: 1.0000 | ORAL_TABLET | Freq: Four times a day (QID) | ORAL | Status: DC

## 2017-09-18 MED ORDER — DIMETHYL FUMARATE 240 MG PO CPDR: 240.0000 mg | DELAYED_RELEASE_CAPSULE | Freq: Two times a day (BID) | ORAL | Status: DC

## 2017-09-18 MED ORDER — HEPARIN (PORCINE) IN NACL 100-0.45 UNIT/ML-% IJ SOLN
700.0000 [IU]/h | INTRAMUSCULAR | Status: AC
  Administered 2017-09-19: 700 [IU]/h via INTRAVENOUS
  Filled 2017-09-18: qty 250

## 2017-09-18 NOTE — H&P (Signed)
[facility] History and Physical  Dawn Peters DPO:[id number] DOB: [birth year] DOA: 09/17/2017  PCP: Armanda Heritage, NP  Patient coming from: home  Chief Complaint: Left calf pain  HPI: Dawn Peters is a 37 y.o. female with a medical history of multiple sclerosis, history of pulmonary embolism in 2005, anxiety and depression, hypertension, who presented to the emergency department with complaints of left calf pain and swelling for 1 week.  Patient denies any recent travel, injury or immobility.  Currently patient denies any chest pain, shortness breath, abdominal pain, nausea or vomiting, diarrhea or constipation, dizziness or headache, dysuria or increased frequency, no new numbness or tingling.  ED Course: CTA obtained, showing bilateral PE.  Started on heparin.  TRH called for admission.  Review of Systems:  All other systems reviewed and are negative.   Past Medical History:  Diagnosis Date  . Anxiety   . Depression   . Embolism ([place])   . Hypertension   . MS (multiple sclerosis) ([place])     Past Surgical History:  Procedure Laterality Date  . BILATERAL HIP ARTHROSCOPY Left 07/2013  . JOINT REPLACEMENT Bilateral     R 2004 and L 2005    Social History:  reports that she has never smoked. She has never used smokeless tobacco. She reports that she does not drink alcohol or use drugs.  Allergies  Allergen Reactions  . Ace Inhibitors Swelling  . Lisinopril Swelling    Lower lip swelling    Family History  Problem Relation Age of Onset  . Healthy Mother   . Healthy Father   . Breast cancer Unknown        paternal grandmother dx age 17  . Colon cancer Unknown        paternal grandmother  . Hypertension Other   . Diabetes Other     Prior to Admission medications   Medication Sig Start Date End Date Taking? Authorizing Provider  amantadine (SYMMETREL) 100 MG capsule Take 100 mg by mouth 2 (two) times daily. 09/11/17  Yes [provider]    amitriptyline (ELAVIL) 25 MG tablet TAKE ONE OR TWO AT BEDTIME Patient taking differently: Take 1 tablet by mouth daily 11/09/16  Yes Sater, Nanine Means, MD  amLODipine (NORVASC) 5 MG tablet Take 1 tablet (5 mg total) by mouth daily. 05/21/17  Yes Arrien, Jimmy Picket, MD  amphetamine-dextroamphetamine (ADDERALL XR) 20 MG 24 hr capsule Take 1 capsule (20 mg total) by mouth daily. 08/01/17  Yes Sater, Nanine Means, MD  baclofen (LIORESAL) 20 MG tablet Take 1 tablet (20 mg total) by mouth 3 (three) times daily as needed (for ms). Patient taking differently: Take 20 mg by mouth 3 (three) times daily.  08/03/17  Yes Sater, Nanine Means, MD  busPIRone (BUSPAR) 15 MG tablet Take 1 tablet (15 mg total) by mouth 2 (two) times daily. 08/02/17  Yes Sater, Nanine Means, MD  citalopram (CELEXA) 20 MG tablet Take 20 mg by mouth daily. 09/11/17  Yes [provider]  dalfampridine 10 MG TB12 Take 1 tablet (10 mg total) by mouth 2 (two) times daily. 08/18/17  Yes Sater, Nanine Means, MD  Dimethyl Fumarate (TECFIDERA) 240 MG CPDR Take 240 mg by mouth 2 (two) times daily.   Yes [provider]  gabapentin (NEURONTIN) 800 MG tablet Take 800 mg by mouth 3 (three) times daily. 09/04/17  Yes [provider]  hydrOXYzine (ATARAX/VISTARIL) 10 MG tablet Take 10 mg by mouth 3 (three) times daily as needed  for anxiety. 09/11/17  Yes [provider]  lamoTRIgine (LAMICTAL) 200 MG tablet Take 1 tablet (200 mg total) by mouth 2 (two) times daily. 08/05/16  Yes Sater, Nanine Means, MD  metoprolol tartrate (LOPRESSOR) 25 MG tablet Take 0.5 tablets (12.5 mg total) by mouth 2 (two) times daily. Patient taking differently: Take 25 mg by mouth 2 (two) times daily.  05/21/17 09/18/17 Yes Arrien, Jimmy Picket, MD  Oxcarbazepine (TRILEPTAL) 300 MG tablet Take 1 tablet (300 mg total) by mouth 2 (two) times daily. 11/11/15  Yes Sater, Nanine Means, MD  oxyCODONE-acetaminophen (PERCOCET/ROXICET) 5-325 MG tablet Take 1 tablet by mouth 4  (four) times daily. 04/23/17  Yes Theodis Blaze, MD  pregabalin (LYRICA) 200 MG capsule Take 1 capsule (200 mg total) by mouth 2 (two) times daily. 05/17/17  Yes Sater, Nanine Means, MD  SUMAtriptan (IMITREX) 100 MG tablet Take 1 tablet (100 mg total) by mouth once as needed for migraine. May repeat in 2 hours if headache persists or recurs. 12/28/16  Yes Sater, Nanine Means, MD  tamsulosin (FLOMAX) 0.4 MG CAPS capsule Take 0.4 mg by mouth daily.   Yes [provider]  tiZANidine (ZANAFLEX) 4 MG tablet TAKE 1 TABLET BY MOUTH AT BEDTIME 10/18/16  Yes Sater, Nanine Means, MD  valACYclovir (VALTREX) 500 MG tablet Take 1 tablet (500 mg total) by mouth 2 (two) times daily. 03/20/17  Yes Sater, Nanine Means, MD    Physical Exam: Vitals:   09/18/17 0636 09/18/17 0636  BP: (!) 135/95 (!) 135/95  Pulse: 93 95  Resp:  14  Temp:    SpO2: 98% 100%     General: Well developed, well nourished, NAD, appears stated age  HEENT: NCAT, PERRLA, EOMI, Anicteic Sclera, mucous membranes moist.   Neck: Supple, no JVD, no masses  Cardiovascular: S1 S2 auscultated, no rubs, murmurs or gallops. Regular rate and rhythm.  Respiratory: Clear to auscultation bilaterally with equal chest rise  Abdomen: Soft, obese, nontender, nondistended, + bowel sounds  Extremities: warm dry without cyanosis clubbing or edema of RLE, LLE calf edema and TTP  Neuro: AAOx3, cranial nerves grossly intact. Strength 5/5 in patient's upper and lower extremities bilaterally  Skin: Without rashes exudates or nodules  Psych: Normal affect and demeanor with intact judgement and insight  Labs on Admission: I have personally reviewed following labs and imaging studies CBC: Recent Labs  Lab 09/18/17 0024  WBC 6.7  NEUTROABS 4.8  HGB 13.8  HCT 38.9  MCV 96.3  PLT 220   Basic Metabolic Panel: Recent Labs  Lab 09/18/17 0024  NA 142  K 3.4*  CL 105  CO2 27  GLUCOSE 92  BUN 16  CREATININE 1.44*  CALCIUM 9.1    GFR: Estimated Creatinine Clearance: 58.4 mL/min (A) (by C-G formula based on SCr of 1.44 mg/dL (H)). Liver Function Tests: No results for input(s): AST, ALT, ALKPHOS, BILITOT, PROT, ALBUMIN in the last 168 hours. No results for input(s): LIPASE, AMYLASE in the last 168 hours. No results for input(s): AMMONIA in the last 168 hours. Coagulation Profile: Recent Labs  Lab 09/18/17 0633  INR 1.08   Cardiac Enzymes: No results for input(s): CKTOTAL, CKMB, CKMBINDEX, TROPONINI in the last 168 hours. BNP (last 3 results) No results for input(s): PROBNP in the last 8760 hours. HbA1C: No results for input(s): HGBA1C in the last 72 hours. CBG: No results for input(s): GLUCAP in the last 168 hours. Lipid Profile: No results for input(s): CHOL, HDL, LDLCALC, TRIG, CHOLHDL,  LDLDIRECT in the last 72 hours. Thyroid Function Tests: No results for input(s): TSH, T4TOTAL, FREET4, T3FREE, THYROIDAB in the last 72 hours. Anemia Panel: No results for input(s): VITAMINB12, FOLATE, FERRITIN, TIBC, IRON, RETICCTPCT in the last 72 hours. Urine analysis:    Component Value Date/Time   COLORURINE YELLOW 05/26/2017 2257   APPEARANCEUR Clear 06/21/2017 1650   LABSPEC 1.028 05/26/2017 2257   PHURINE 5.0 05/26/2017 2257   GLUCOSEU Negative 06/21/2017 1650   HGBUR NEGATIVE 05/26/2017 2257   BILIRUBINUR Negative 06/21/2017 1650   KETONESUR NEGATIVE 05/26/2017 2257   PROTEINUR Negative 06/21/2017 1650   PROTEINUR NEGATIVE 05/26/2017 2257   UROBILINOGEN 0.2 12/15/2016 1136   NITRITE Negative 06/21/2017 1650   NITRITE NEGATIVE 05/26/2017 2257   LEUKOCYTESUR Negative 06/21/2017 1650   Sepsis Labs: @LABRCNTIP (procalcitonin:4,lacticidven:4) )No results found for this or any previous visit (from the past 240 hour(s)).   Radiological Exams on Admission: Ct Angio Chest Pe W/cm &/or Wo Cm  Result Date: 09/18/2017 CLINICAL DATA:  Shortness of breath.  Left leg swelling. EXAM: CT ANGIOGRAPHY CHEST WITH  CONTRAST TECHNIQUE: Multidetector CT imaging of the chest was performed using the standard protocol during bolus administration of intravenous contrast. Multiplanar CT image reconstructions and MIPs were obtained to evaluate the vascular anatomy. CONTRAST:  126mL ISOVUE-370 IOPAMIDOL (ISOVUE-370) INJECTION 76%, 130mL ISOVUE-370 IOPAMIDOL (ISOVUE-370) INJECTION 76% COMPARISON:  None. FINDINGS: Cardiovascular: --Pulmonary arteries: Contrast injection is sufficient to demonstrate satisfactory opacification of the pulmonary arteries to the segmental level.There are bilateral pulmonary emboli, including multiple right upper lobe segmental branches, the right lower lobe segmental branches and the branches to the lingula. There is no evidence of right heart strain. The main pulmonary artery is within normal limits for size. --Aorta: Satisfactory opacification of the thoracic aorta. No aortic dissection or other acute aortic syndrome. Conventional 3 vessel aortic branching pattern. The aortic course and caliber are normal. There is no aortic atherosclerosis. --Heart: Normal size. No pericardial effusion. Mediastinum/Nodes: Patulous esophagus. Mediastinum otherwise normal. Lungs/Pleura: No pulmonary nodules or masses. No pleural effusion or pneumothorax. No focal airspace consolidation. No focal pleural abnormality. Upper Abdomen: Contrast bolus timing is not optimized for evaluation of the abdominal organs. Incompletely visualized hypoattenuating lesion of the liver, statistically most likely to be a hepatic cyst. Musculoskeletal: No chest wall abnormality. No acute or significant osseous findings. Review of the MIP images confirms the above findings. IMPRESSION: 1. Bilateral pulmonary emboli involving the right upper lobe, right lower lobe and lingular segmental branches. No CT evidence of right heart strain. 2. No pulmonary infarction. Critical Value/emergent results were called by telephone at the time of interpretation  on 09/18/2017 at 5:22 am to Dr. Ocie Cornfield , who verbally acknowledged these results. Electronically Signed   By: Ulyses Jarred M.D.   On: 09/18/2017 05:25    EKG: None   Assessment/Plan  Bilateral acute pulmonary embolism -PE back in 2005, was on Coumadin however discontinued. -CTA Chest: Bilateral pulmonary emboli -Presented with left calf pain -Echocardiogram and lower extremity Doppler pending -Placed on heparin  Chronic kidney disease, stage III -Currently stable , continue to monitor BMP  Multiple sclerosis -Stable, continue Tecfidera, baclofen, Zanaflex  Depression/anxiety -Continue Elavil, BuSpar, Celexa, Trileptal  Essential hypertension -Continue amlodipine, metoprolol  Herpes zoster prophylaxis -Continue Valtrex  History of migraine headaches -Continue imitrex PRN  Chronic pain -Continue home medications including oxycodone  DVT prophylaxis: Heparin  Code Status: Full  Family Communication: None at bedside. Admission, patients condition and plan of care including tests being  ordered have been discussed with the patient, who indicates understanding and agrees with the plan and Code Status.  Disposition Plan: Home   Consults called: none   Admission status: Observation   Time spent: 70 minutes  Keanu Lesniak D.O. [facility] Pager [phone]  If 7PM-7AM, please contact night-coverage [email or web address] Password [email or web address] 09/18/2017, 7:50 AM

## 2017-09-18 NOTE — Progress Notes (Signed)
  Echocardiogram 2D Echocardiogram has been performed.  Dawn Peters 09/18/2017, 2:38 PM

## 2017-09-18 NOTE — ED Provider Notes (Signed)
[facility] DEPT Provider Note   CSN: [id number] Arrival date & time: 09/17/17  2201     History   Chief Complaint Chief Complaint  Patient presents with  . Leg Pain  . possible DVT    HPI Dawn Peters is a 37 y.o. female.  HPI 37 year old African-American female past medical history significant for hypertension, MS, prior PE, depression and anxiety presents to the emergency department today for evaluation of left calf pain and edema.  Symptoms started approximately 1 week ago.  Have progressively worsened.  Is concerned that she may have another blood clot.  Patient has a history but is not currently anticoagulated.  She denies any injury to the area.  She has been taking her prescribed hydrocodone for her chronic joint pain for this pain but is not helping.  Patient denies paresthesias or weakness.  She denies any associated chest pain or shortness of breath.  Nothing makes symptoms better.  Palpation and ambulation makes the pain worse.  Pt denies any fever, chill, ha, vision changes, lightheadedness, dizziness, congestion, neck pain, cp, sob, cough, abd pain, n/v/d, urinary symptoms, change in bowel habits, melena, hematochezia, lower extremity paresthesias.  Past Medical History:  Diagnosis Date  . Anxiety   . Depression   . Embolism ([place])   . Hypertension   . MS (multiple sclerosis) [facility])     Patient Active Problem List   Diagnosis Date Noted  . Elevated serum creatinine 05/12/2017  . Acute encephalopathy 04/20/2017  . Acute kidney injury superimposed on CKD ([place]) 04/20/2017  . Hydronephrosis of left kidney 10/24/2016  . Attention deficit 10/24/2016  . Left leg weakness 08/13/2016  . Chronic pain 08/13/2016  . Mild renal insufficiency 08/13/2016  . Left-sided weakness   . High risk medication use 09/15/2015  . Hip pain, bilateral 07/28/2015  . Urinary hesitancy 07/28/2015  . Multiple sclerosis exacerbation ([place]) 07/17/2015  .  Urinary disorder 06/11/2015  . Gait disturbance 05/19/2015  . Numbness 05/19/2015  . Depression with anxiety 05/13/2015  . Other fatigue 05/13/2015  . Left knee pain 05/13/2015  . Avascular necrosis of bones of both hips ([place]) 05/13/2015  . Screening for HIV (human immunodeficiency virus) 07/08/2014  . Essential hypertension, benign 11/19/2013  . Anxiety state, unspecified 11/19/2013  . Screen for STD (sexually transmitted disease) 11/01/2013  . Encounter for routine gynecological examination 11/01/2013  . Oral contraceptive use 10/20/2011  . Multiple sclerosis ([place]) 08/08/2007  . RASH AND OTHER NONSPECIFIC SKIN ERUPTION 06/22/2007  . AVASCULAR NECROSIS, FEMORAL HEAD 03/11/2006  . Essential hypertension 02/03/2006  . 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 DVT, HX OF 02/03/2006    Past Surgical History:  Procedure Laterality Date  . BILATERAL HIP ARTHROSCOPY Left 07/2013  . JOINT REPLACEMENT Bilateral     R 2004 and L 2005     OB History    Gravida  1   Para  1   Term  1   Preterm      AB      Living  1     SAB      TAB      Ectopic      Multiple      Live Births  1            Home Medications    Prior to Admission medications   Medication Sig Start Date End Date Taking? Authorizing Provider  amantadine (SYMMETREL) 100 MG capsule Take 100 mg by mouth 2 (two) times daily. 09/11/17  Yes [provider]  amitriptyline (ELAVIL) 25 MG tablet TAKE ONE OR TWO AT BEDTIME Patient taking differently: Take 1 tablet by mouth daily 11/09/16  Yes Sater, Nanine Means, MD  amLODipine (NORVASC) 5 MG tablet Take 1 tablet (5 mg total) by mouth daily. 05/21/17  Yes Arrien, Jimmy Picket, MD  amphetamine-dextroamphetamine (ADDERALL XR) 20 MG 24 hr capsule Take 1 capsule (20 mg total) by mouth daily. 08/01/17  Yes Sater, Nanine Means, MD  baclofen (LIORESAL) 20 MG tablet Take 1 tablet (20 mg total) by mouth 3 (three) times daily as needed (for ms). Patient taking differently: Take  20 mg by mouth 3 (three) times daily.  08/03/17  Yes Sater, Nanine Means, MD  busPIRone (BUSPAR) 15 MG tablet Take 1 tablet (15 mg total) by mouth 2 (two) times daily. 08/02/17  Yes Sater, Nanine Means, MD  citalopram (CELEXA) 20 MG tablet Take 20 mg by mouth daily. 09/11/17  Yes [provider]  dalfampridine 10 MG TB12 Take 1 tablet (10 mg total) by mouth 2 (two) times daily. 08/18/17  Yes Sater, Nanine Means, MD  Dimethyl Fumarate (TECFIDERA) 240 MG CPDR Take 240 mg by mouth 2 (two) times daily.   Yes [provider]  gabapentin (NEURONTIN) 800 MG tablet Take 800 mg by mouth 3 (three) times daily. 09/04/17  Yes [provider]  hydrOXYzine (ATARAX/VISTARIL) 10 MG tablet Take 10 mg by mouth 3 (three) times daily as needed for anxiety. 09/11/17  Yes [provider]  lamoTRIgine (LAMICTAL) 200 MG tablet Take 1 tablet (200 mg total) by mouth 2 (two) times daily. 08/05/16  Yes Sater, Nanine Means, MD  metoprolol tartrate (LOPRESSOR) 25 MG tablet Take 0.5 tablets (12.5 mg total) by mouth 2 (two) times daily. Patient taking differently: Take 25 mg by mouth 2 (two) times daily.  05/21/17 09/18/17 Yes Arrien, Jimmy Picket, MD  Oxcarbazepine (TRILEPTAL) 300 MG tablet Take 1 tablet (300 mg total) by mouth 2 (two) times daily. 11/11/15  Yes Sater, Nanine Means, MD  oxyCODONE-acetaminophen (PERCOCET/ROXICET) 5-325 MG tablet Take 1 tablet by mouth 4 (four) times daily. 04/23/17  Yes Theodis Blaze, MD  pregabalin (LYRICA) 200 MG capsule Take 1 capsule (200 mg total) by mouth 2 (two) times daily. 05/17/17  Yes Sater, Nanine Means, MD  SUMAtriptan (IMITREX) 100 MG tablet Take 1 tablet (100 mg total) by mouth once as needed for migraine. May repeat in 2 hours if headache persists or recurs. 12/28/16  Yes Sater, Nanine Means, MD  tamsulosin (FLOMAX) 0.4 MG CAPS capsule Take 0.4 mg by mouth daily.   Yes [provider]  tiZANidine (ZANAFLEX) 4 MG tablet TAKE 1 TABLET BY MOUTH AT BEDTIME 10/18/16  Yes Sater,  Nanine Means, MD  valACYclovir (VALTREX) 500 MG tablet Take 1 tablet (500 mg total) by mouth 2 (two) times daily. 03/20/17  Yes Sater, Nanine Means, MD    Family History Family History  Problem Relation Age of Onset  . Healthy Mother   . Healthy Father   . Breast cancer Unknown        paternal grandmother dx age 72  . Colon cancer Unknown        paternal grandmother  . Hypertension Other   . Diabetes Other     Social History Social History   Tobacco Use  . Smoking status: Never Smoker  . Smokeless tobacco: Never Used  Substance Use Topics  . Alcohol use: No  . Drug use: No     Allergies  Ace inhibitors and Lisinopril   Review of Systems Review of Systems  All other systems reviewed and are negative.    Physical Exam Updated Vital Signs BP (!) 148/96 (BP Location: Left Arm) Comment: Simultaneous filing. User may not have seen previous data.  Pulse (!) 102 Comment: Simultaneous filing. User may not have seen previous data.  Temp 98.7 F (37.1 C) (Oral)   Resp 12   Ht 5\' 5"  (1.651 m)   Wt 90.7 kg (200 lb)   LMP 09/15/2017   SpO2 96% Comment: Simultaneous filing. User may not have seen previous data.  BMI 33.28 kg/m   Physical Exam  Constitutional: She appears well-developed and well-nourished. No distress.  HENT:  Head: Normocephalic and atraumatic.  Eyes: Right eye exhibits no discharge. Left eye exhibits no discharge. No scleral icterus.  Neck: Normal range of motion.  Cardiovascular: Regular rhythm, normal heart sounds and intact distal pulses. Exam reveals no gallop and no friction rub.  No murmur heard. Tachycardia noted.  Pulmonary/Chest: Effort normal and breath sounds normal. No stridor. No respiratory distress. She has no wheezes. She has no rales. She exhibits no tenderness.  Musculoskeletal: Normal range of motion.  Mild edema noted to the left lower leg.  She does have pain with palpation of the left calf muscle.  There is no palpable cord.  Skin  compartments are soft though.  Her DP pulses are 2+ bilaterally.  Brisk cap refill with sensation intact.  She does have plantar flexion and dorsiflexion of the left foot however this does cause her some pain in her left calf.  No erythema or warmth of the joints.  Neurological: She is alert.  Skin: Skin is warm and dry. Capillary refill takes less than 2 seconds. No pallor.  Psychiatric: Her behavior is normal. Judgment and thought content normal.  Nursing note and vitals reviewed.    ED Treatments / Results  Labs (all labs ordered are listed, but only abnormal results are displayed) Labs Reviewed  CBC WITH DIFFERENTIAL/PLATELET - Abnormal; Notable for the following components:      Result Value   MCH 34.2 (*)    All other components within normal limits  BASIC METABOLIC PANEL - Abnormal; Notable for the following components:   Potassium 3.4 (*)    Creatinine, Ser 1.44 (*)    GFR calc non Af Amer 45 (*)    GFR calc Af Amer 52 (*)    All other components within normal limits  D-DIMER, QUANTITATIVE (NOT AT [facility]) - Abnormal; Notable for the following components:   D-Dimer, Quant 7.71 (*)    All other components within normal limits    EKG None  Radiology No results found.  Procedures .Critical Care Performed by: Doristine Devoid, PA-C Authorized by: Doristine Devoid, PA-C   Critical care provider statement:    Critical care time (minutes):  60   Critical care was necessary to treat or prevent imminent or life-threatening deterioration of the following conditions: PE and DVT requiring heparin drip.   Critical care was time spent personally by me on the following activities:  Development of treatment plan with patient or surrogate, discussions with consultants, discussions with primary provider, evaluation of patient's response to treatment, examination of patient, obtaining history from patient or surrogate, ordering and performing treatments and interventions, ordering  and review of radiographic studies, ordering and review of laboratory studies, pulse oximetry, re-evaluation of patient's condition and review of old charts   (including critical care time)  Medications Ordered in ED Medications  iopamidol (ISOVUE-370) 76 % injection (has no administration in time range)  iopamidol (ISOVUE-370) 76 % injection (has no administration in time range)  sodium chloride 0.9 % bolus 1,000 mL (0 mLs Intravenous Stopped 09/18/17 0223)  HYDROmorphone (DILAUDID) injection 0.5 mg (0.5 mg Intravenous Given 09/18/17 0119)  iopamidol (ISOVUE-370) 76 % injection 100 mL (100 mLs Intravenous Contrast Given 09/18/17 0244)  iopamidol (ISOVUE-370) 76 % injection 100 mL (100 mLs Intravenous Contrast Given 09/18/17 0420)     Initial Impression / Assessment and Plan / ED Course  I have reviewed the triage vital signs and the nursing notes.  Pertinent labs & imaging results that were available during my care of the patient were reviewed by me and considered in my medical decision making (see chart for details).     She presents to the emergency department today for evaluation of left leg pain and swelling.  Prior history of DVT and PE not currently anticoagulated.  Patient denies chest pain or shortness of breath.  Patient noted to be tachycardic on arrival to the ED but denies any associated symptoms with this.  She does have swelling and pain to the calf of the left leg.  Neurovascularly intact.  No palpable cord noted.  Lungs clear to auscultation bilaterally.  Heart regular rhythm without murmur rubs murmurs or gallops.  Patient had lab work and triage that included d-dimer.  Lab work shows no leukocytosis.  Mild elevation in creatinine however this appears to be similar to patient's baseline.  No significant electrolyte derangement.  D-dimer was significantly elevated at 7.  Unable to obtain venous ultrasound at this time of left lower leg.  Given elevated d-dimer with tachycardia  despite chest pain or shortness of breath will proceed with CTA of chest after discussion with patient.  Was notified by CT staff that patient's IV infiltrated and she had extra half of 100 cc of IV contrast to her right AC.  This was elevated and ice pack was applied.  Patient was rechecked.  There is no erythema.  Mild edema noted.  She is not tender to palpation.  Radial pulses are 2+.  Sensation intact with brisk cap refill and normal grip strength.  Second IV was placed and CT was able to be performed.  I was notified by radiologist that patient has small bilateral PEs without any signs of right heart strain.  This was discussed with patient.  She was will be started on heparin and admitted to the hospital for further management.  Ultrasound was ordered.  Spoke with hospital medicine who agrees to admission will see patient in the ED and place admission orders.  Patient was discussed with my attending who also evaluated patient and is agreeable with this plan.  Patient remains hemodynamic stable this time.  Final Clinical Impressions(s) / ED Diagnoses   Final diagnoses:  PE (pulmonary thromboembolism) ([place])  Left leg pain    ED Discharge Orders    None       Aaron Edelman 09/18/17 0535    Rolland Porter, MD 09/18/17 [phone]

## 2017-09-18 NOTE — ED Notes (Signed)
ED TO INPATIENT HANDOFF REPORT  Name/Age/Gender Dawn Peters 37 y.o. female  Code Status Code Status History    Date Active Date Inactive Code Status Order ID Comments User Context   05/19/2017 0837 05/23/2017 2138 Full Code [id number]  Hosie Poisson, MD Inpatient   04/20/2017 0504 04/24/2017 2039 Full Code [id number]  Norval Morton, MD ED   08/13/2016 0351 08/14/2016 1808 Full Code [id number]  Vianne Bulls, MD ED   12/22/2015 0105 12/23/2015 2157 Full Code [id number]  [name], DO Inpatient   07/17/2015 0956 07/19/2015 1340 Full Code [id number]  Kelvin Cellar, MD Inpatient      Home/SNF/Other Home  Chief Complaint Possible Blood Clot   Level of Care/Admitting Diagnosis ED Disposition    ED Disposition Condition Comment   [facility] Area: [facility] [[id number]]  Level of Care: Telemetry [5]  Admit to tele based on following criteria: Monitor for Ischemic changes  Diagnosis: Pulmonary embolism [facility]) [[id number]]  Admitting Physician: Rise Patience [phone]  Attending Physician: Rise Patience Lei.Right  PT Class (Do Not Modify): Observation [104]  PT Acc Code (Do Not Modify): Observation [10022]       Medical History Past Medical History:  Diagnosis Date  . Anxiety   . Depression   . Embolism ([place])   . Hypertension   . MS (multiple sclerosis) (HCC)     Allergies Allergies  Allergen Reactions  . Ace Inhibitors Swelling  . Lisinopril Swelling    Lower lip swelling    IV Location/Drains/Wounds Patient Lines/Drains/Airways Status   Active Line/Drains/Airways    Name:   Placement date:   Placement time:   Site:   Days:   Peripheral IV 09/18/17 Left Antecubital   09/18/17    0402    Antecubital   less than 1          Labs/Imaging Results for orders placed or performed during the hospital encounter of 09/17/17 (from the past 48 hour(s))  CBC with Differential     Status: Abnormal   Collection Time: 09/18/17 12:24 AM   Result Value Ref Range   WBC 6.7 4.0 - 10.5 K/uL   RBC 4.04 3.87 - 5.11 MIL/uL   Hemoglobin 13.8 12.0 - 15.0 g/dL   HCT 38.9 36.0 - 46.0 %   MCV 96.3 78.0 - 100.0 fL   MCH 34.2 (H) 26.0 - 34.0 pg   MCHC 35.5 30.0 - 36.0 g/dL   RDW 14.6 11.5 - 15.5 %   Platelets 262 150 - 400 K/uL   Neutrophils Relative % 71 %   Neutro Abs 4.8 1.7 - 7.7 K/uL   Lymphocytes Relative 16 %   Lymphs Abs 1.0 0.7 - 4.0 K/uL   Monocytes Relative 11 %   Monocytes Absolute 0.7 0.1 - 1.0 K/uL   Eosinophils Relative 2 %   Eosinophils Absolute 0.2 0.0 - 0.7 K/uL   Basophils Relative 0 %   Basophils Absolute 0.0 0.0 - 0.1 K/uL    Comment: Performed at [facility], [place] [address] [place], [place] [address]  Basic metabolic panel     Status: Abnormal   Collection Time: 09/18/17 12:24 AM  Result Value Ref Range   Sodium 142 135 - 145 mmol/L   Potassium 3.4 (L) 3.5 - 5.1 mmol/L   Chloride 105 98 - 111 mmol/L    Comment: Please note change in reference range.   CO2 27 22 - 32 mmol/L   Glucose, Bld 92 70 -  99 mg/dL    Comment: Please note change in reference range.   BUN 16 6 - 20 mg/dL    Comment: Please note change in reference range.   Creatinine, Ser 1.44 (H) 0.44 - 1.00 mg/dL   Calcium 9.1 8.9 - 10.3 mg/dL   GFR calc non Af Amer 45 (L) >60 mL/min   GFR calc Af Amer 52 (L) >60 mL/min    Comment: (NOTE) The eGFR has been calculated using the CKD EPI equation. This calculation has not been validated in all clinical situations. eGFR's persistently <60 mL/min signify possible Chronic Kidney Disease.    Anion gap 10 5 - 15    Comment: Performed at [facility], [place] [address], [place], [place] [address]  D-dimer, quantitative (not at [facility])     Status: Abnormal   Collection Time: 09/18/17 12:24 AM  Result Value Ref Range   D-Dimer, Quant 7.71 (H) 0.00 - 0.50 ug/mL-FEU    Comment: (NOTE) At the manufacturer cut-off of 0.50 ug/mL FEU, this assay has been documented  to exclude PE with a sensitivity and negative predictive value of 97 to 99%.  At this time, this assay has not been approved by the FDA to exclude DVT/VTE. Results should be correlated with clinical presentation. Performed at [facility], [place] [address], [place], [place] [address]    Ct Angio Chest Pe W/cm &/or Wo Cm  Result Date: 09/18/2017 CLINICAL DATA:  Shortness of breath.  Left leg swelling. EXAM: CT ANGIOGRAPHY CHEST WITH CONTRAST TECHNIQUE: Multidetector CT imaging of the chest was performed using the standard protocol during bolus administration of intravenous contrast. Multiplanar CT image reconstructions and MIPs were obtained to evaluate the vascular anatomy. CONTRAST:  160m ISOVUE-370 IOPAMIDOL (ISOVUE-370) INJECTION 76%, 1052mISOVUE-370 IOPAMIDOL (ISOVUE-370) INJECTION 76% COMPARISON:  None. FINDINGS: Cardiovascular: --Pulmonary arteries: Contrast injection is sufficient to demonstrate satisfactory opacification of the pulmonary arteries to the segmental level.There are bilateral pulmonary emboli, including multiple right upper lobe segmental branches, the right lower lobe segmental branches and the branches to the lingula. There is no evidence of right heart strain. The main pulmonary artery is within normal limits for size. --Aorta: Satisfactory opacification of the thoracic aorta. No aortic dissection or other acute aortic syndrome. Conventional 3 vessel aortic branching pattern. The aortic course and caliber are normal. There is no aortic atherosclerosis. --Heart: Normal size. No pericardial effusion. Mediastinum/Nodes: Patulous esophagus. Mediastinum otherwise normal. Lungs/Pleura: No pulmonary nodules or masses. No pleural effusion or pneumothorax. No focal airspace consolidation. No focal pleural abnormality. Upper Abdomen: Contrast bolus timing is not optimized for evaluation of the abdominal organs. Incompletely visualized hypoattenuating lesion of the liver,  statistically most likely to be a hepatic cyst. Musculoskeletal: No chest wall abnormality. No acute or significant osseous findings. Review of the MIP images confirms the above findings. IMPRESSION: 1. Bilateral pulmonary emboli involving the right upper lobe, right lower lobe and lingular segmental branches. No CT evidence of right heart strain. 2. No pulmonary infarction. Critical Value/emergent results were called by telephone at the time of interpretation on 09/18/2017 at 5:22 am to Dr. KEOcie Cornfield who verbally acknowledged these results. Electronically Signed   By: KeUlyses Jarred.D.   On: 09/18/2017 05:25    Pending Labs Unresulted Labs (From admission, onward)   Start     Ordered   09/19/17 0500  CBC  Daily,   R    Question:  Specimen collection method  Answer:  IV Team=IV Team collect  09/18/17 0616   09/18/17 1300  Heparin level (unfractionated)  Once-Timed,   R    Question:  Specimen collection method  Answer:  IV Team=IV Team collect   09/18/17 0651   09/18/17 0603  APTT  STAT,   R    Question:  Specimen collection method  Answer:  IV Team=IV Team collect   09/18/17 0602   09/18/17 0603  Protime-INR  STAT,   R    Question:  Specimen collection method  Answer:  IV Team=IV Team collect   09/18/17 0602      Vitals/Pain Today's Vitals   09/18/17 0130 09/18/17 0330 09/18/17 0636 09/18/17 0636  BP: (!) 128/98 (!) 148/96 (!) 135/95 (!) 135/95  Pulse: (!) 103 (!) 102 93 95  Resp:  12  14  Temp:      TempSrc:      SpO2: 98% 96% 98% 100%  Weight:      Height:      PainSc:        Isolation Precautions No active isolations  Medications Medications  iopamidol (ISOVUE-370) 76 % injection (has no administration in time range)  iopamidol (ISOVUE-370) 76 % injection (has no administration in time range)  heparin ADULT infusion 100 units/mL (25000 units/231m sodium chloride 0.45%) (1,300 Units/hr Intravenous New Bag/Given 09/18/17 0643)  sodium chloride 0.9 % bolus 1,000  mL (0 mLs Intravenous Stopped 09/18/17 0223)  HYDROmorphone (DILAUDID) injection 0.5 mg (0.5 mg Intravenous Given 09/18/17 0119)  iopamidol (ISOVUE-370) 76 % injection 100 mL (100 mLs Intravenous Contrast Given 09/18/17 0244)  iopamidol (ISOVUE-370) 76 % injection 100 mL (100 mLs Intravenous Contrast Given 09/18/17 0420)  heparin bolus via infusion 4,000 Units (4,000 Units Intravenous Bolus from Bag 09/18/17 0642)    Mobility walks

## 2017-09-18 NOTE — Progress Notes (Signed)
ANTICOAGULATION CONSULT NOTE - Initial Consult  Pharmacy Consult for IV heparin Indication: pulmonary embolus  Allergies  Allergen Reactions  . Ace Inhibitors Swelling  . Lisinopril Swelling    Lower lip swelling    Patient Measurements: Height: 5\' 5"  (165.1 cm) Weight: 200 lb (90.7 kg) IBW/kg (Calculated) : 57 Heparin Dosing Weight: 67 kg  Vital Signs: Temp: 98.7 F (37.1 C) (07/14 2220) Temp Source: Oral (07/14 2220) BP: 148/96 (07/15 0330) Pulse Rate: 102 (07/15 0330)  Labs: Recent Labs    09/18/17 0024  HGB 13.8  HCT 38.9  PLT 262  CREATININE 1.44*    Estimated Creatinine Clearance: 58.4 mL/min (A) (by C-G formula based on SCr of 1.44 mg/dL (H)).   Medical History: Past Medical History:  Diagnosis Date  . Anxiety   . Depression   . Embolism ([place])   . Hypertension   . MS (multiple sclerosis) ([place])     Medications:  Scheduled:  . heparin  4,000 Units Intravenous Once  . iopamidol      . iopamidol       Infusions:  . heparin      Assessment: 35 yoF with hx of PE from PICC line now with tenderness in the calf without overt edema. CT shows bilateral PE with no evidence of right heart strain. IV heparin per Rx Goal of Therapy:  Heparin level 0.3-0.7 units/ml Monitor platelets by anticoagulation protocol: Yes   Plan:  Baseline coags STAT Heparin 4000 units IV x1 Start drip at 1300 units/hr Check 1st HL 6 hours after drip started Daily CBC/HL  Dorrene German 09/18/2017,6:10 AM

## 2017-09-18 NOTE — Progress Notes (Signed)
Patient has Medicaid coverage, Eliquis vs Xarelto. Benefits check, either will likely be around $15. [phone]

## 2017-09-18 NOTE — Progress Notes (Signed)
ANTICOAGULATION CONSULT NOTE - Follow Up Consult  Pharmacy Consult for heparib Indication: acute pulmonary embolus and DVT  Allergies  Allergen Reactions  . Ace Inhibitors Swelling  . Lisinopril Swelling    Lower lip swelling    Patient Measurements: Height: 5\' 5"  (165.1 cm) Weight: 200 lb (90.7 kg) IBW/kg (Calculated) : 57 Heparin Dosing Weight: 77 kg  Vital Signs: Temp: 97.5 F (36.4 C) (07/15 0801) Temp Source: Oral (07/15 0801) BP: 149/101 (07/15 0801) Pulse Rate: 93 (07/15 0801)  Labs: Recent Labs    09/18/17 0024 09/18/17 0633  HGB 13.8  --   HCT 38.9  --   PLT 262  --   APTT  --  21*  LABPROT  --  13.9  INR  --  1.08  CREATININE 1.44*  --     Estimated Creatinine Clearance: 58.4 mL/min (A) (by C-G formula based on SCr of 1.44 mg/dL (H)).  Assessment: Patient's a 37 y.o F with hx PE and MS presented to the ED on 09/17/17 with c/o left lower leg pain.  Chest CTA showed bilateral PEs (no evidence of right heart strain) and LE doppler positive for left DVT.  Patient's currently on heparin drip for acute VTE.  Today, 09/18/2017: - first heparin level is supra-therapeutic at 0.96 - cbc stable - per RN, a little bit of blood noted at IV site when she bends her arm  Goal of Therapy:  Heparin level 0.3-0.7 units/ml Monitor platelets by anticoagulation protocol: Yes   Plan:  - reduce heparin drip to 1150 units/hr - check 6 hr heparin level - monitor for s/s bleeding  Summer Mccolgan P 09/18/2017,1:35 PM

## 2017-09-18 NOTE — Progress Notes (Signed)
ANTICOAGULATION CONSULT NOTE - Follow Up Consult  Pharmacy Consult for heparib Indication: acute pulmonary embolus and DVT  Allergies  Allergen Reactions  . Ace Inhibitors Swelling  . Lisinopril Swelling    Lower lip swelling    Patient Measurements: Height: 5\' 5"  (165.1 cm) Weight: 200 lb (90.7 kg) IBW/kg (Calculated) : 57 Heparin Dosing Weight: 77 kg  Vital Signs: Temp: 97.7 F (36.5 C) (07/15 2122) Temp Source: Oral (07/15 2122) BP: 145/93 (07/15 2122) Pulse Rate: 100 (07/15 2122)  Labs: Recent Labs    09/18/17 0024 09/18/17 9201 09/18/17 1338 09/18/17 2119  HGB 13.8  --   --   --   HCT 38.9  --   --   --   PLT 262  --   --   --   APTT  --  21*  --   --   LABPROT  --  13.9  --   --   INR  --  1.08  --   --   HEPARINUNFRC  --   --  0.96* 1.05*  CREATININE 1.44*  --   --   --     Estimated Creatinine Clearance: 58.4 mL/min (A) (by C-G formula based on SCr of 1.44 mg/dL (H)).  Assessment: Patient's a 37 y.o F with hx PE and MS presented to the ED on 09/17/17 with c/o left lower leg pain.  Chest CTA showed bilateral PEs (no evidence of right heart strain) and LE doppler positive for left DVT.  Patient's currently on heparin drip for acute VTE.  Today, 09/18/2017:  9pm Heparin level supratherapeutic and rising despite decrease in heparin infusion rate from 1300 to 1150 units/hr  Per RN, no further problems with heparin drip - bleeding from IV site earlier in day  Goal of Therapy:  Heparin level 0.3-0.7 units/ml Monitor platelets by anticoagulation protocol: Yes   Plan:  1) Per [place] nomogram, reduce heparin infusion from 1150 units/hr to 700 units/hr 2) Recheck heparin level 6 hours after heparin rate reduced 3) Daily heparin level and CBC   Adrian Saran, PharmD, BCPS Pager [phone] 09/18/2017 9:49 PM

## 2017-09-18 NOTE — Progress Notes (Signed)
Left lower extremity venous duplex has been completed. There is evidence of acute deep vein thrombosis involving the popliteal, and posterior tibial veins of the left lower extremity.  Results were given to the patient's nurse, Tera.   09/18/17 9:38 AM Dawn Peters RVT

## 2017-09-19 DIAGNOSIS — M79605 Pain in left leg: Secondary | ICD-10-CM

## 2017-09-19 DIAGNOSIS — G9340 Encephalopathy, unspecified: Secondary | ICD-10-CM

## 2017-09-19 DIAGNOSIS — I10 Essential (primary) hypertension: Secondary | ICD-10-CM

## 2017-09-19 DIAGNOSIS — G35 Multiple sclerosis: Secondary | ICD-10-CM

## 2017-09-19 DIAGNOSIS — I2699 Other pulmonary embolism without acute cor pulmonale: Secondary | ICD-10-CM | POA: Diagnosis not present

## 2017-09-19 DIAGNOSIS — G8929 Other chronic pain: Secondary | ICD-10-CM

## 2017-09-19 DIAGNOSIS — Z86718 Personal history of other venous thrombosis and embolism: Secondary | ICD-10-CM

## 2017-09-19 DIAGNOSIS — F418 Other specified anxiety disorders: Secondary | ICD-10-CM | POA: Diagnosis not present

## 2017-09-19 LAB — BASIC METABOLIC PANEL
ANION GAP: 9 (ref 5–15)
BUN: 13 mg/dL (ref 6–20)
CO2: 26 mmol/L (ref 22–32)
CREATININE: 1.38 mg/dL — AB (ref 0.44–1.00)
Calcium: 9.1 mg/dL (ref 8.9–10.3)
Chloride: 106 mmol/L (ref 98–111)
GFR calc non Af Amer: 47 mL/min — ABNORMAL LOW (ref >60)
GFR, EST AFRICAN AMERICAN: 55 mL/min — AB (ref >60)
Glucose, Bld: 99 mg/dL (ref 70–99)
Potassium: 4.2 mmol/L (ref 3.5–5.1)
SODIUM: 141 mmol/L (ref 135–145)

## 2017-09-19 LAB — CBC
HCT: 38.7 % (ref 36.0–46.0)
Hemoglobin: 13.1 g/dL (ref 12.0–15.0)
MCH: 32.9 pg (ref 26.0–34.0)
MCHC: 33.9 g/dL (ref 30.0–36.0)
MCV: 97.2 fL (ref 78.0–100.0)
PLATELETS: 260 10*3/uL (ref 150–400)
RBC: 3.98 MIL/uL (ref 3.87–5.11)
RDW: 15 % (ref 11.5–15.5)
WBC: 6.2 10*3/uL (ref 4.0–10.5)

## 2017-09-19 LAB — HEPARIN LEVEL (UNFRACTIONATED): Heparin Unfractionated: 0.51 IU/mL (ref 0.30–0.70)

## 2017-09-19 MED ORDER — APIXABAN 5 MG PO TABS: 5.0000 mg | ORAL_TABLET | Freq: Two times a day (BID) | ORAL | Status: DC

## 2017-09-19 MED ORDER — ADULT MULTIVITAMIN W/MINERALS CH: 1.0000 | ORAL_TABLET | Freq: Every day | ORAL | 0 refills | Status: AC

## 2017-09-19 MED ORDER — APIXABAN 5 MG PO TABS
10.0000 mg | ORAL_TABLET | Freq: Two times a day (BID) | ORAL | Status: DC
  Administered 2017-09-19: 10 mg via ORAL
  Filled 2017-09-19: qty 2

## 2017-09-19 MED ORDER — APIXABAN 5 MG PO TABS: 10.0000 mg | ORAL_TABLET | Freq: Two times a day (BID) | ORAL | Status: DC

## 2017-09-19 MED ORDER — APIXABAN 5 MG PO TABS: 10.0000 mg | ORAL_TABLET | Freq: Two times a day (BID) | ORAL | 0 refills | Status: DC

## 2017-09-19 MED ORDER — APIXABAN 5 MG PO TABS: 5.0000 mg | ORAL_TABLET | Freq: Two times a day (BID) | ORAL | 0 refills | Status: DC

## 2017-09-19 MED ORDER — AMLODIPINE BESYLATE 10 MG PO TABS
10.0000 mg | ORAL_TABLET | Freq: Every day | ORAL | Status: DC
  Administered 2017-09-19: 10 mg via ORAL
  Filled 2017-09-19: qty 1

## 2017-09-19 NOTE — Progress Notes (Signed)
ANTICOAGULATION CONSULT NOTE - Follow Up Consult  Pharmacy Consult for heparin--> Eliquis Indication: acute pulmonary embolus and DVT  Allergies  Allergen Reactions  . Ace Inhibitors Swelling  . Lisinopril Swelling    Lower lip swelling    Patient Measurements: Height: 5\' 5"  (165.1 cm) Weight: 200 lb (90.7 kg) IBW/kg (Calculated) : 57 Heparin Dosing Weight: 77 kg  Vital Signs: Temp: 98.1 F (36.7 C) (07/16 0531) Temp Source: Oral (07/16 0531) BP: 120/100 (07/16 0531) Pulse Rate: 97 (07/16 0531)  Labs: Recent Labs    09/18/17 0024 09/18/17 6389 09/18/17 1338 09/18/17 2119 09/19/17 0435  HGB 13.8  --   --   --  13.1  HCT 38.9  --   --   --  38.7  PLT 262  --   --   --  260  APTT  --  21*  --   --   --   LABPROT  --  13.9  --   --   --   INR  --  1.08  --   --   --   HEPARINUNFRC  --   --  0.96* 1.05* 0.51  CREATININE 1.44*  --   --   --  1.38*    Estimated Creatinine Clearance: 60.9 mL/min (A) (by C-G formula based on SCr of 1.38 mg/dL (H)).  Assessment: Patient's a 37 y.o F with hx PE and MS presented to the ED on 09/17/17 with c/o left lower leg pain.  Chest CTA showed bilateral PEs (no evidence of right heart strain) and LE doppler positive for left DVT.  Patient was started on heparin drip for acute VTE on admission --> change to Eliquis on 7/16.  Today, 09/19/2017: - cbc stable - no bleeding documented -  scr down 1.38 (crcl~60)  Goal of Therapy:  Heparin level 0.3-0.7 units/ml Monitor platelets by anticoagulation protocol: Yes   Plan:  - d/c heparin drip  - start Eliquis 10 mg BID x7 days, then 5 mg BID - monitor for s/s bleeding  Jaymon Dudek P 09/19/2017,7:40 AM

## 2017-09-19 NOTE — Care Management Note (Signed)
Case Management Note  Patient Details  Name: TERRIONNA BRIDWELL MRN: [id number] Date of Birth: 11-Mar-1980  Subjective/Objective:  Spoke to patient about d/c-she plans to d/c home-she wants to make sure her blood levels are ok-Nurse informed. Patient has medicaid-has eliquis 30 day free discount coupon.No further CM needs.                  Action/Plan:d/c home.   Expected Discharge Date:  09/19/17               Expected Discharge Plan:  Home/Self Care  In-House Referral:  NA  Discharge planning Services  CM Consult, Medication Assistance  Post Acute Care Choice:  NA Choice offered to:  NA  DME Arranged:  N/A DME Agency:  NA  HH Arranged:  NA HH Agency:  NA  Status of Service:  Completed, signed off  If discussed at [place] of Stay Meetings, dates discussed:    Additional Comments:  Dessa Phi, RN 09/19/2017, 1:05 PM

## 2017-09-19 NOTE — Discharge Summary (Signed)
Discharge Summary  Dawn Peters:[id number] DOB: [birth year]  PCP: Armanda Heritage, NP  Admit date: 09/17/2017 Discharge date: 09/19/2017  Time spent: 25 minutes  Recommendations for Outpatient Follow-up:  1. Take your medications as prescribed. 2. Follow-up with your PCP. 3. PCP to consider hematology referral outpatient.  Discharge Diagnoses:  Active Hospital Problems   Diagnosis Date Noted  . Acute encephalopathy 04/20/2017  . Chronic pain 08/13/2016  . Depression with anxiety 05/13/2015  . Essential hypertension, benign 11/19/2013  . Multiple sclerosis ([place]) 08/08/2007  . DVT, HX OF 02/03/2006  . Essential hypertension 02/03/2006    Resolved Hospital Problems  No resolved problems to display.    Discharge Condition: Stable  Diet recommendation: Resume previous diet  Vitals:   09/18/17 2122 09/19/17 0531  BP: (!) 145/93 (!) 120/100  Pulse: 100 97  Resp: 18 20  Temp: 97.7 F (36.5 C) 98.1 F (36.7 C)  SpO2: 95% (!) 67%    History of present illness:  Dawn Peters is a 37 y.o. female with a medical history of multiple sclerosis, history of pulmonary embolism in 2005, anxiety and depression, hypertension, who presented to the emergency department with complaints of left calf pain and swelling for 1 week.  On presentation found to have bilateral PE with no right heart strain and left lower extremity DVT.  DVT and PE appear to be unprovoked.  Initially on heparin drip which was switched to Eliquis.  2D echo done and revealed normal left ventricle function with no mention of right heart strain.  09/19/2017: Patient seen and examined at her bedside.  She has no new complaints.  Vital signs are stable.  The day of discharge patient was hemodynamically stable.  She will need to follow-up with her primary care provider post hospitalization.    Hospital Course:  Active Problems:   Multiple sclerosis ([place])   Essential hypertension   DVT, HX OF   Essential  hypertension, benign   Depression with anxiety   Chronic pain   Acute encephalopathy  Recurrent PE, bilateral  -PE back in 2005, was on Coumadin however discontinued. -CTA Chest: Bilateral pulmonary emboli -Presented with left calf pain-left lower extremity positive for DVT -2D echo unremarkable for right heart strain with normal left ventricular function -Continue Eliquis.  Will be on anticoagulation indefinitely due to recurrence of pulmonary embolism -Follow-up with PCP-PCP to consider hematology referral.  Chronic kidney disease, stage III -Currently stable  -Follow-up with PCP  Multiple sclerosis -Stable, continue Tecfidera, baclofen, Zanaflex  Depression/anxiety -Continue Elavil, BuSpar, Celexa, Trileptal  Essential hypertension -Continue amlodipine, metoprolol  Herpes zoster prophylaxis -Continue Valtrex  History of migraine headaches -Continue imitrex PRN  Chronic pain -Continue home medications including oxycodone    Procedures:  None  Consultations:  None  Discharge Exam: BP (!) 120/100 (BP Location: Left Arm)   Pulse 97   Temp 98.1 F (36.7 C) (Oral)   Resp 20   Ht 5\' 5"  (1.651 m)   Wt 90.7 kg (200 lb)   LMP 09/15/2017   SpO2 (!) 67%   BMI 33.28 kg/m  . General: 37 y.o. year-old female well developed well nourished in no acute distress.  Alert and oriented x3. . Cardiovascular: Regular rate and rhythm with no rubs or gallops.  No thyromegaly or JVD noted.   Marland Kitchen Respiratory: Clear to auscultation with no wheezes or rales. Good inspiratory effort. . Abdomen: Soft nontender nondistended with normal bowel sounds x4 quadrants. . Musculoskeletal: 1+ pitting edema in left lower  extremity.  . Skin: No ulcerative lesions noted or rashes. . Psychiatry: Mood is appropriate for condition and setting  Discharge Instructions You were cared for by a hospitalist during your hospital stay. If you have any questions about your discharge medications or  the care you received while you were in the hospital after you are discharged, you can call the unit and asked to speak with the hospitalist on call if the hospitalist that took care of you is not available. Once you are discharged, your primary care physician will handle any further medical issues. Please note that NO REFILLS for any discharge medications will be authorized once you are discharged, as it is imperative that you return to your primary care physician (or establish a relationship with a primary care physician if you do not have one) for your aftercare needs so that they can reassess your need for medications and monitor your lab values.   Allergies as of 09/19/2017      Reactions   Ace Inhibitors Swelling   Lisinopril Swelling   Lower lip swelling      Medication List    TAKE these medications   amantadine 100 MG capsule Commonly known as:  SYMMETREL Take 100 mg by mouth 2 (two) times daily.   amitriptyline 25 MG tablet Commonly known as:  ELAVIL TAKE ONE OR TWO AT BEDTIME What changed:  See the new instructions.   amLODipine 5 MG tablet Commonly known as:  NORVASC Take 1 tablet (5 mg total) by mouth daily.   amphetamine-dextroamphetamine 20 MG 24 hr capsule Commonly known as:  ADDERALL XR Take 1 capsule (20 mg total) by mouth daily.   apixaban 5 MG Tabs tablet Commonly known as:  ELIQUIS Take 2 tablets (10 mg total) by mouth 2 (two) times daily.   apixaban 5 MG Tabs tablet Commonly known as:  ELIQUIS Take 1 tablet (5 mg total) by mouth 2 (two) times daily. Start taking on:  09/26/2017   baclofen 20 MG tablet Commonly known as:  LIORESAL Take 1 tablet (20 mg total) by mouth 3 (three) times daily as needed (for ms). What changed:  when to take this   busPIRone 15 MG tablet Commonly known as:  BUSPAR Take 1 tablet (15 mg total) by mouth 2 (two) times daily.   citalopram 20 MG tablet Commonly known as:  CELEXA Take 20 mg by mouth daily.   dalfampridine 10  MG Tb12 Take 1 tablet (10 mg total) by mouth 2 (two) times daily.   gabapentin 800 MG tablet Commonly known as:  NEURONTIN Take 800 mg by mouth 3 (three) times daily as needed (for neuropathy if not relieved with lyrica).   hydrOXYzine 10 MG tablet Commonly known as:  ATARAX/VISTARIL Take 10 mg by mouth 3 (three) times daily as needed for anxiety.   lamoTRIgine 200 MG tablet Commonly known as:  LAMICTAL Take 1 tablet (200 mg total) by mouth 2 (two) times daily.   metoprolol tartrate 25 MG tablet Commonly known as:  LOPRESSOR Take 0.5 tablets (12.5 mg total) by mouth 2 (two) times daily. What changed:  how much to take   multivitamin with minerals Tabs tablet Take 1 tablet by mouth daily. Start taking on:  09/20/2017   Oxcarbazepine 300 MG tablet Commonly known as:  TRILEPTAL Take 1 tablet (300 mg total) by mouth 2 (two) times daily.   oxyCODONE-acetaminophen 5-325 MG tablet Commonly known as:  PERCOCET/ROXICET Take 1 tablet by mouth 4 (four) times daily.  pregabalin 200 MG capsule Commonly known as:  LYRICA Take 1 capsule (200 mg total) by mouth 2 (two) times daily.   SUMAtriptan 100 MG tablet Commonly known as:  IMITREX Take 1 tablet (100 mg total) by mouth once as needed for migraine. May repeat in 2 hours if headache persists or recurs.   tamsulosin 0.4 MG Caps capsule Commonly known as:  FLOMAX Take 0.4 mg by mouth daily.   TECFIDERA 240 MG Cpdr Generic drug:  Dimethyl Fumarate Take 240 mg by mouth 2 (two) times daily.   tiZANidine 4 MG tablet Commonly known as:  ZANAFLEX TAKE 1 TABLET BY MOUTH AT BEDTIME   valACYclovir 500 MG tablet Commonly known as:  VALTREX Take 1 tablet (500 mg total) by mouth 2 (two) times daily.      Allergies  Allergen Reactions  . Ace Inhibitors Swelling  . Lisinopril Swelling    Lower lip swelling   Follow-up Information    Armanda Heritage, NP. Call in 1 day(s).   Specialty:  Nurse Practitioner Why:  Please call for an  appointment. Contact information: [place] [address] [phone]            The results of significant diagnostics from this hospitalization (including imaging, microbiology, ancillary and laboratory) are listed below for reference.    Significant Diagnostic Studies: Ct Angio Chest Pe W/cm &/or Wo Cm  Result Date: 09/18/2017 CLINICAL DATA:  Shortness of breath.  Left leg swelling. EXAM: CT ANGIOGRAPHY CHEST WITH CONTRAST TECHNIQUE: Multidetector CT imaging of the chest was performed using the standard protocol during bolus administration of intravenous contrast. Multiplanar CT image reconstructions and MIPs were obtained to evaluate the vascular anatomy. CONTRAST:  168mL ISOVUE-370 IOPAMIDOL (ISOVUE-370) INJECTION 76%, 122mL ISOVUE-370 IOPAMIDOL (ISOVUE-370) INJECTION 76% COMPARISON:  None. FINDINGS: Cardiovascular: --Pulmonary arteries: Contrast injection is sufficient to demonstrate satisfactory opacification of the pulmonary arteries to the segmental level.There are bilateral pulmonary emboli, including multiple right upper lobe segmental branches, the right lower lobe segmental branches and the branches to the lingula. There is no evidence of right heart strain. The main pulmonary artery is within normal limits for size. --Aorta: Satisfactory opacification of the thoracic aorta. No aortic dissection or other acute aortic syndrome. Conventional 3 vessel aortic branching pattern. The aortic course and caliber are normal. There is no aortic atherosclerosis. --Heart: Normal size. No pericardial effusion. Mediastinum/Nodes: Patulous esophagus. Mediastinum otherwise normal. Lungs/Pleura: No pulmonary nodules or masses. No pleural effusion or pneumothorax. No focal airspace consolidation. No focal pleural abnormality. Upper Abdomen: Contrast bolus timing is not optimized for evaluation of the abdominal organs. Incompletely visualized hypoattenuating lesion of the liver,  statistically most likely to be a hepatic cyst. Musculoskeletal: No chest wall abnormality. No acute or significant osseous findings. Review of the MIP images confirms the above findings. IMPRESSION: 1. Bilateral pulmonary emboli involving the right upper lobe, right lower lobe and lingular segmental branches. No CT evidence of right heart strain. 2. No pulmonary infarction. Critical Value/emergent results were called by telephone at the time of interpretation on 09/18/2017 at 5:22 am to Dr. Ocie Cornfield , who verbally acknowledged these results. Electronically Signed   By: Ulyses Jarred M.D.   On: 09/18/2017 05:25    Microbiology: No results found for this or any previous visit (from the past 240 hour(s)).   Labs: Basic Metabolic Panel: Recent Labs  Lab 09/18/17 0024 09/19/17 0435  NA 142 141  K 3.4* 4.2  CL 105 106  CO2 27 26  GLUCOSE 92 99  BUN 16 13  CREATININE 1.44* 1.38*  CALCIUM 9.1 9.1   Liver Function Tests: No results for input(s): AST, ALT, ALKPHOS, BILITOT, PROT, ALBUMIN in the last 168 hours. No results for input(s): LIPASE, AMYLASE in the last 168 hours. No results for input(s): AMMONIA in the last 168 hours. CBC: Recent Labs  Lab 09/18/17 0024 09/19/17 0435  WBC 6.7 6.2  NEUTROABS 4.8  --   HGB 13.8 13.1  HCT 38.9 38.7  MCV 96.3 97.2  PLT 262 260   Cardiac Enzymes: No results for input(s): CKTOTAL, CKMB, CKMBINDEX, TROPONINI in the last 168 hours. BNP: BNP (last 3 results) No results for input(s): BNP in the last 8760 hours.  ProBNP (last 3 results) No results for input(s): PROBNP in the last 8760 hours.  CBG: No results for input(s): GLUCAP in the last 168 hours.     Signed:  Kayleen Memos, MD [facility] 09/19/2017, 12:35 PM

## 2017-09-19 NOTE — Discharge Instructions (Addendum)
Pulmonary Embolism A pulmonary embolism (PE) is a sudden blockage or decrease of blood flow in one lung or both lungs. Most blockages come from a blood clot that forms in a lower leg, thigh, or arm vein (deep vein thrombosis, DVT) and travels to the lungs. A clot is blood that has thickened into a gel or solid. PE is a dangerous and life-threatening condition that needs to be treated right away. What are the causes? This condition is usually caused by a blood clot that forms in a vein and moves to the lungs. In rare cases, it may be caused by air, fat, part of a tumor, or other tissue that moves through the veins and into the lungs. What increases the risk? The following factors may make you more likely to develop this condition:  Having DVT or a history of DVT.  Being older than age 67.  Personal or family history of blood clots or blood clotting disease.  Major or lengthy surgery.  Orthopedic surgery, especially hip or knee replacement.  Traumatic injury, such as breaking a hip or leg.  Spinal cord injury.  Stroke.  Taking medicines that contain estrogen. These include birth control pills and hormone replacement therapy.  Long-term (chronic) lung or heart disease.  Cancer and chemotherapy.  Having a central venous catheter.  Pregnancy and the period after delivery.  What are the signs or symptoms? Symptoms of this condition usually start suddenly and include:  Shortness of breath while active or at rest.  Coughing or coughing up blood or blood-tinged mucus.  Chest pain that is often worse with deep breaths.  Rapid or irregular heartbeat.  Feeling light-headed or dizzy.  Fainting.  Feeling anxious.  Sweating.  Pain and swelling in a leg. This is a symptom of DVT, which can lead to PE.  How is this diagnosed? This condition may be diagnosed based on:  Your medical history.  A physical exam.  Blood tests to check blood oxygen level and how well your  blood clots, and a D-dimer blood test, which checks your blood for a substance that is released when a blood clot breaks apart.  CT pulmonary angiogram. This test checks blood flow in and around your lungs.  Ventilation-perfusion scan, also called a lung VQ scan. This test measures air flow and blood flow to the lungs.  Ultrasound of the legs to look for blood clots.  How is this treated? Treatment for this conditions depends on many factors, such as the cause of your PE, your risk for bleeding or developing more clots, and other medical conditions you have. Treatment aims to remove, dissolve, or stop blood clots from forming or growing larger. Treatment may include:  Blood thinning medicines (anticoagulants) to stop clots from forming or growing. These medicines may be given as a pill, as an injection, or through an IV tube (infusion).  Medicines that dissolve clots (thrombolytics).  A procedure in which a flexible tube is used to remove a blood clot (embolectomy) or deliver medicine to destroy it (catheter-directed thrombolysis).  A procedure in which a filter is inserted into a large vein that carries blood to the heart (inferior vena cava). This filter (vena cava filter) catches blood clots before they reach the lungs.  Surgery to remove the clot (surgical embolectomy). This is rare.  You may need a combination of immediate, long-term (up to 3 months after diagnosis), and extended (more than 3 months after diagnosis) treatments. Your treatment may continue for several months (maintenance  therapy). You and your health care provider will work together to choose the treatment program that is best for you. Follow these instructions at home: If you are taking an anticoagulant medicine:  Take the medicine every day at the same time each day.  Understand what foods and drugs interact with your medicine.  Understand the side effects of this medicine, including excessive bruising or bleeding.  Ask your health care provider or pharmacist about other side effects. General instructions  Take over-the-counter and prescription medicines only as told by your health care provider.  Anticoagulant medicines may cause side effects, including easy bruising and difficulty stopping bleeding. If you are prescribed an anticoagulant: ? Hold pressure over cuts for longer than usual. ? Tell your dentist and other health care providers that you are taking anticoagulants before you have any procedure that may cause bleeding. ? Avoid contact sports. ? Be extra careful when handling sharp objects. ? Use a soft toothbrush. Floss with waxed dental floss. ? Shave with an Copy.  Wear a medical alert bracelet or carry a medical alert card that says you have had a PE.  Ask your health care provider when you may return to your normal activities.  Talk with your health care provider about any travel plans. It is important to make sure that you are still able to take your medicine while on trips.  Keep all follow-up visits as told by your health care provider. This is important. How is this prevented? Take these actions to lower your risk of developing another PE:  Exercise regularly. Take frequent walks. For at least 30 minutes every day, engage in: ? Activity that involves moving your arms and legs. ? Activity that encourages good blood flow through your body by increasing your heart rate.  While traveling, drink plenty of water and avoid drinking alcohol. Ask your health care provider if you should wear below-the-knee compression stockings.  Avoid sitting or lying in bed for long periods of time without moving your legs. Exercise your arms and legs every hour during long-distance travel (over 4 hours).  If you are hospitalized or have surgery, ask your health care provider about your risks and what treatments can help prevent blood clots.  Maintain a healthy weight. Ask your health care  provider what weight is healthy for you.  If you are a woman who is over age 38, avoid unnecessary use of medicines that contain estrogen, including birth control pills.  Do not use any products that contain nicotine or tobacco, such as cigarettes and e-cigarettes. This is especially important if you take estrogen medicines. If you need help quitting, ask your health care provider.  See your health care provider for regular checkups. This may include blood tests and ultrasound testing on your legs to check for new blood clots.  Contact a health care provider if:  You missed a dose of your blood thinner medicine. Get help right away if:  You have new or increased pain, swelling, warmth, or redness in an arm or leg.  You have numbness or tingling in an arm or leg.  You have shortness of breath while active or at rest.  You have chest pain.  You have a rapid or irregular heartbeat.  You feel light-headed or dizzy.  You cough up blood.  You have blood in your vomit, stool, or urine.  You have a fever.  You have abdomen (abdominal) pain.  You have a severe fall or head injury.  You have  a severe headache.  You have vision changes.  You cannot move your arms or legs.  You are confused or have memory loss.  You are bleeding for 10 minutes or more, even with strong pressure on the wound. These symptoms may represent a serious problem that is an emergency. Do not wait to see if the symptoms will go away. Get medical help right away. Call your local emergency services (911 in the [place] Do not drive yourself to the hospital. Summary  A pulmonary embolism (PE) is a sudden blockage or decrease of blood flow in one lung or both lungs. PE is a dangerous and life-threatening condition that needs to be treated right away.  Having deep vein thrombosis (DVT) or a history of DVT is the most common risk factor for PE.  Treatments for this condition usually include medicines to thin  your blood (anticoagulants) or medicines to break apart blood clots (thrombolytics).  If you are prescribed blood thinners, it is important to take the medicine every single day at the same time each day.  If you have signs of PE or DVT, call your local emergency services (911 in the [place] This information is not intended to replace advice given to you by your health care provider. Make sure you discuss any questions you have with your health care provider. Document Released: 02/19/2000 Document Revised: 03/26/2016 Document Reviewed: 03/26/2016 Elsevier Interactive Patient Education  2018 [place].   Venous Thromboembolism Prevention Venous thromboembolism (VTE) is a condition in which a blood clot (thrombus) develops in the body. A thrombus usually occurs in a deep vein in the leg or the pelvis (DVT), but it can also occur in the arm. Sometimes, pieces of a thrombus can break off from its original place of development and travel through the bloodstream to other parts of the body. When that happens, the thrombus is called an embolus. An embolus that travels to one or both lungs is called a pulmonary embolism. An embolism can block the blood flow in the blood vessels of other organs as well. VTE is a serious health condition that can cause disability or death. It is very important to get help right away and to not ignore symptoms. How can a VTE be prevented?  Exercise regularly. Take a brisk 30 minute walk every day. Staying active and moving around can help you to prevent blood clots.  Avoid sitting or lying in bed for long periods of time without moving your legs. Change your position often, especially during long-distance travel (over 4 hours).  If you are a woman who is over 54 years of age, avoid unnecessary use of medicines that contain estrogen. These include birth control pills and hormone replacement therapy.  Do not smoke, especially if you take estrogen medicines. If you need  help quitting, ask your health care provider.  Eat plenty of fruits and vegetables. Ask your health care provider or dietitian if there are foods that you should avoid.  Maintain a weight that is appropriate for your height. Ask your health care provider what weight is healthy for you.  Wear loose-fitting clothing. Avoid constrictive or tight clothing around your legs or waist.  Try not to bump or injure your legs. Avoid crossing your legs when you are sitting.  Do not use pillows under your knees while lying down unless told by your health care provider.  Wear support hose (compression stockings or TED hose) as told by your health care provider Compression stockings increase blood flow  in your legs and can help prevent blood clots. Do not let them bunch up when you are wearing them. How can I prevent VTE when I travel? Long-distance travel (over 4 hours) can increase the risk of a VTE. To prevent VTE when traveling:  Exercise your legs every hour by standing, stretching, and bending and straightening your legs. If you are traveling by airplane, train, or bus, walk up and down the aisle as often as possible to get your blood moving. If you are traveling by car, stop and get out of the car every hour to exercise your legs and stretch. Other types of exercise might include: ? Keeping your feet flat on the ground and raising your toes. ? Switching from tightening the muscles in your calves and thighs to relaxing those same muscles while you are sitting. ? Pointing and flexing your feet at the ankle joints while you are sitting.  Stay well hydrated while traveling. Drink enough water to keep your urine clear or pale yellow.  Avoid drinking alcohol during long travel.  Generally, it is not recommended that you take medicines to prevent DVT during routine travel. How can VTE be prevented if I am hospitalized? A VTE may be prevented by taking medicines that are prescribed to prevent blood clots  (anticoagulants). You can also help to prevent VTE while in the hospital by taking these actions:  Get out of bed and walk. Ask your health care provider if this is safe for you to do.  Request the use of a sequential compression device (SCD). This is a machine that pumps air into compression sleeves that are wrapped around your legs.  Request the use of compression stockings, which are tight, elastic stockings that apply pressure to the lower legs. Compression stockings are sometimes used with SCDs.  How can I prevent VTE after surgery? Understand that there is an increased risk for VTE for the first 4-6 weeks after surgery. During this time:  Avoid long-distance travel (over 4 hours). If you must travel during this time, ask your health care provider about additional preventive actions that you can take. These might include exercising your arms and legs every hour while you travel.  Avoid sitting or lying still for too long. If possible, get up and walk around one time every hour. Ask your health care provider when this is safe for you to do.  Get help right away if:  You have new or increased pain, swelling, or redness in an arm or leg.  You have numbness or tingling in an arm or leg.  You have shortness of breath while active or at rest.  You have chest pain.  You have a rapid or irregular heartbeat.  You feel light-headed or dizzy.  You cough up blood.  You notice blood in your vomit, bowel movement, or urine. These symptoms may represent a serious problem that is an emergency. Do not wait to see if the symptoms will go away. Get medical help right away. Call your local emergency services (911 in the [place] Do not drive yourself to the hospital. This information is not intended to replace advice given to you by your health care provider. Make sure you discuss any questions you have with your health care provider. Document Released: 02/09/2009 Document Revised: 07/30/2015  Document Reviewed: 06/18/2014 Elsevier Interactive Patient Education  2018 [place] on my medicine - ELIQUIS (apixaban)  This medication education was reviewed with me or my healthcare representative as part of  my discharge preparation.    Why was Eliquis prescribed for you? Eliquis was prescribed to treat blood clots that may have been found in the veins of your legs (deep vein thrombosis) or in your lungs (pulmonary embolism) and to reduce the risk of them occurring again.  What do You need to know about Eliquis ? The starting dose is 10 mg (two 5 mg tablets) taken TWICE daily for the FIRST SEVEN (7) DAYS, then on 09/26/17  the dose is reduced to ONE 5 mg tablet taken TWICE daily.  Eliquis may be taken with or without food.   Try to take the dose about the same time in the morning and in the evening. If you have difficulty swallowing the tablet whole please discuss with your pharmacist how to take the medication safely.  Take Eliquis exactly as prescribed and DO NOT stop taking Eliquis without talking to the doctor who prescribed the medication.  Stopping may increase your risk of developing a new blood clot.  Refill your prescription before you run out.  After discharge, you should have regular check-up appointments with your healthcare provider that is prescribing your Eliquis.    What do you do if you miss a dose? If a dose of ELIQUIS is not taken at the scheduled time, take it as soon as possible on the same day and twice-daily administration should be resumed. The dose should not be doubled to make up for a missed dose.  Important Safety Information A possible side effect of Eliquis is bleeding. You should call your healthcare provider right away if you experience any of the following: ? Bleeding from an injury or your nose that does not stop. ? Unusual colored urine (red or dark brown) or unusual colored stools (red or black). ? Unusual bruising for unknown  reasons. ? A serious fall or if you hit your head (even if there is no bleeding).  Some medicines may interact with Eliquis and might increase your risk of bleeding or clotting while on Eliquis. To help avoid this, consult your healthcare provider or pharmacist prior to using any new prescription or non-prescription medications, including herbals, vitamins, non-steroidal anti-inflammatory drugs (NSAIDs) and supplements.  This website has more information on Eliquis (apixaban): http://[email or web address].com/eliquis/home

## 2017-09-19 NOTE — Progress Notes (Signed)
ANTICOAGULATION CONSULT NOTE - Follow Up Consult  Pharmacy Consult for heparib Indication: acute pulmonary embolus and DVT  Allergies  Allergen Reactions  . Ace Inhibitors Swelling  . Lisinopril Swelling    Lower lip swelling    Patient Measurements: Height: 5\' 5"  (165.1 cm) Weight: 200 lb (90.7 kg) IBW/kg (Calculated) : 57 Heparin Dosing Weight: 77 kg  Vital Signs: Temp: 97.7 F (36.5 C) (07/15 2122) Temp Source: Oral (07/15 2122) BP: 145/93 (07/15 2122) Pulse Rate: 100 (07/15 2122)  Labs: Recent Labs    09/18/17 0024 09/18/17 2542 09/18/17 1338 09/18/17 2119 09/19/17 0435  HGB 13.8  --   --   --  13.1  HCT 38.9  --   --   --  38.7  PLT 262  --   --   --  260  APTT  --  21*  --   --   --   LABPROT  --  13.9  --   --   --   INR  --  1.08  --   --   --   HEPARINUNFRC  --   --  0.96* 1.05* 0.51  CREATININE 1.44*  --   --   --   --     Estimated Creatinine Clearance: 58.4 mL/min (A) (by C-G formula based on SCr of 1.44 mg/dL (H)).  Assessment: Patient's a 37 y.o F with hx PE and MS presented to the ED on 09/17/17 with c/o left lower leg pain.  Chest CTA showed bilateral PEs (no evidence of right heart strain) and LE doppler positive for left DVT.  Patient's currently on heparin drip for acute VTE.  Today, 09/19/2017: HL 0.51, therapeutic H/H and Plts WNL No bleeding or other issues per RN  Goal of Therapy:  Heparin level 0.3-0.7 units/ml Monitor platelets by anticoagulation protocol: Yes   Plan:  1) continue heparin drip at 700 units/hr 2) Recheck heparin level 6 hours  3) Daily heparin level and CBC   Dolly Rias RPh 09/19/2017, 5:16 AM Pager [phone]

## 2017-09-20 ENCOUNTER — Encounter: Payer: Self-pay | Admitting: Neurology

## 2017-09-20 ENCOUNTER — Other Ambulatory Visit: Payer: Self-pay

## 2017-09-20 ENCOUNTER — Ambulatory Visit (INDEPENDENT_AMBULATORY_CARE_PROVIDER_SITE_OTHER): Payer: Medicare Other | Admitting: Neurology

## 2017-09-20 VITALS — BP 132/87 | HR 122 | Resp 18 | Ht 65.0 in | Wt 200.0 lb

## 2017-09-20 DIAGNOSIS — N399 Disorder of urinary system, unspecified: Secondary | ICD-10-CM | POA: Diagnosis not present

## 2017-09-20 DIAGNOSIS — R269 Unspecified abnormalities of gait and mobility: Secondary | ICD-10-CM | POA: Diagnosis not present

## 2017-09-20 DIAGNOSIS — R29898 Other symptoms and signs involving the musculoskeletal system: Secondary | ICD-10-CM

## 2017-09-20 DIAGNOSIS — G35 Multiple sclerosis: Secondary | ICD-10-CM | POA: Diagnosis not present

## 2017-09-20 DIAGNOSIS — Z79899 Other long term (current) drug therapy: Secondary | ICD-10-CM

## 2017-09-20 MED ORDER — TAMSULOSIN HCL 0.4 MG PO CAPS: 0.4000 mg | ORAL_CAPSULE | Freq: Every day | ORAL | 11 refills | Status: DC

## 2017-09-20 MED ORDER — AMPHETAMINE-DEXTROAMPHET ER 20 MG PO CP24: 20.0000 mg | ORAL_CAPSULE | Freq: Every day | ORAL | 0 refills | Status: DC

## 2017-09-20 MED ORDER — PREGABALIN 225 MG PO CAPS: 225.0000 mg | ORAL_CAPSULE | Freq: Two times a day (BID) | ORAL | 5 refills | Status: DC

## 2017-09-20 NOTE — Progress Notes (Signed)
[facility]  PATIENT: Dawn Peters DOB: [birth year]  REFERRING DOCTOR OR PCP:  Dr. Annitta Needs SOURCE: Patient, records in EMR, lab results and radiology reports in EMR, MRI images reviewed on PACS.  _________________________________   HISTORICAL  CHIEF COMPLAINT:  Chief Complaint  Patient presents with  . Multiple Sclerosis    Sts. she continues to tolerate Tecfidera well.  Sts. she is having more trouble walking.  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 On Eliquis.  Recently spent 2-3 mos. in rehab where she wasn't very active, but was on Lovenox while there, and sts. since d/c home she takes care of household chores, but other ithan that is not active./fim    HISTORY OF PRESENT ILLNESS:   Dawn Peters is a 37 year-old woman who was diagnosed with MS in 2000.     Update 09/20/2017: She had two cycles of Lemtrada (2017 and 2018) and switched to Tecfidera a few months ago as she was still having progression (has active SPMS) and  She has left sided leg weakness and spasticity and has reduced gait.     She uses a cane.     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     She has some fatigue but this is stable.    She sleeps well most nights.    Migraines are doing well.     Her creatinine has ben mildly elevated 1.3 to 1.4 but stable   Anti-GBM Ab was fine.  Update 06/21/2017: She has an active form of SPMS.  She re-presented with worsening left sided weakness and more trouble walking a few days after her last visit.  Imaging studies did not show any new lesions.  She was given IV solu-medrol but did not feel she got any better.   She then was d/c to [facility] for Rehab.     Her last Lemtrada was 10/10/2016-10/12/16.      She did not follow up with nephrology appointment scheduled for March.   Creatinine has been increased.   During last admission, she  was evaluated by medicine and received IV fluids and creatinine improved to 1.39-1.41.  One year ago creatinine was 0.9.      Patients with [name] can have anti-glomerular membrane antibodies and this was checked 05/17/2017 and was normal.   There also have been a few cases of other glomerulonephropathy seen in clinical studies and post-marketing data.     She is not on any NSAID's.   Lab and imaging reports, MRI images on PACS and admission discharge notes from her recent admission were reviewed  Update 05/17/2017:   She had a second course of Lemtrada in August 2018.   She notes her left leg is worse.  It is weaker and she is falling more.  She was in the hospital last month due to hypersomnia and generalized weakness. No definite etiology was determined. Drug screen was negative. Urinalysis showed small amount of hemoglobin but no protein or casts.   Repeat brain MRI showed no new lesions.   She received 3 days of IV Solu-Medrol.  Admission and discharge notes were reviewed.Her creatinine has been elevated with the last reading 1.52.    She is scheduled to see nephrology next week.  She also has apparent hydronephrosis on MRI  and I referred her to urology last October but she does not recall going.   She is on Ampyra and feels it helps her strength some.     Update 12/28/2016:   She had her 3 day course of Lemtrada in August. She tolerated it well. She has not had any exacerbations while on Lemtrada for the MS. Her last MRI was in June 2018 and did not show any new lesions. She continues to have difficulty with her gait due to the left leg weakness and spasticity. An AFO initially helped her walk but she is having trouble with fit that she believes is worse due to weight gain. Balance is also mildly off. Spasticity is predominantly in the left leg she gets some benefit with baclofen and tizanidine.    She has painful dysesthesias of the left leg helped partially by gabapentin and lamotrigine. She also sees  pain management for opiates. She has worsening urinary hesitancy this year and an MRI of the thoracic spine showed hydronephrosis. She has fatigue is both physical and cognitive. It is worse in the heat so she feels it is doing better than a couple months ago. Well most nights. Mood has done better with citalopram and BuSpar.  ' From 10/24/2016: MS:   She had her 3 day course of Lemtrada in the office a couple weeks ago and thee infusions went well. . She is JCV high titer positive and had switched from Tysabri to [place] earlier.     Gait/strength:    Gait has worsened due mostly to the left leg weakness and spasticity over the last year. She got her left AFO and is starting to get used to it.   She has trouble lifting the left leg/foot.  Ba;lance is off.     She is on Ampyra with some benefit.  Arms are doing ok.  She has left sided spasticity, only partially helped by baclofen and tizanidine.  Dysesthesia:   Patient continues to report painful left leg dysesthesias. These are similar to the last visit.  .  She is currently on gabapentin 800 mg by mouth 4 times a day and lamotrigine 200 mg twice a day for the dysesthetic pain.  With her medications and tramadol she feels that the pain is tolerable.   Vision:    She denies any MS related visual problems.  Bladder:   Urinary hesitancy continues to be a problem. She gets some benefit from tamsulosin. MRI of the thoracic spine with recent hospitalization shows hydronephrosis on the left.  She has not yet been referred to urology.  Bowel is fine..  Fatigue/sleep:   She is noting more trouble with her fatigue, no occurring daily.  . She does especially poorly in the hear.  She denies sleepiness -- issues are more fatigue.   She has never tried a stimulant.   She sleeps well most nights.  Mood:  Her depression and anxiety is better with citalopram 40 mg and buspar bid.    Anxiety is more of a problem than depression and she feels that the medications are  not completely controlling this.  Cognition:   She denies significant difficulties with her cognitive abilities.   Focus is mildly reduced.  MS history:   She was diagnosed with MS in 2000 after presenting with gait difficulties, numbness and headaches. An MRI of the brain was consistent with MS. She was then started on Betaseron. She had difficulty tolerating Betaseron and at some point switched to Novantrone. She  took Novantrone every 3 months for a year or so. A little later, she was placed on Tysabri and she stayed on it for a couple of years. However, she was JCV-positive and switched off. She did well on Tysabri. She had been on Tecfidera since 2015. Her MRI from June 2015 showed that there had been no changes compared to an MRI from 2014.   However, she had an exacerbation March 2017.   The 08/07/2013 MRI of the brain shows multiple T2/FLAIR hyperintense foci located in the cerebellum, middle cerebellar peduncles, pons and in the periventricular white matter of the hemispheres in a pattern and configuration consistent with chronic demyelinating plaque associated with MS. There were no acute findings. There was no change compared to the previous MRI from 01/16/2013 that was also reviewed. An MRI of the cervical spine shows subtle T2 hyperintense signal within the cord and there were no acute findings.  REVIEW OF SYSTEMS: Constitutional: No fevers, chills, sweats, or change in appetite.   Notes fatigue, some insomnia Eyes: No visual changes, double vision, eye pain Ear, nose and throat: No hearing loss, ear pain, nasal congestion, sore throat Cardiovascular: No chest pain, palpitations Respiratory: No shortness of breath at rest or with exertion.   No wheezes GastrointestinaI: No nausea, vomiting, diarrhea, abdominal pain, fecal incontinence Genitourinary: No dysuria.   She has hesitancy. Sometimes she has difficulty emptying completely Flomax has helped urinary hesitancy. 1 x  nocturia. Musculoskeletal: as above Integumentary: No rash, pruritus, skin lesions Neurological: as above Psychiatric: Mild depression at this time.  Moderate anxiety Endocrine: No palpitations, diaphoresis, change in appetite, change in weigh or increased thirst Hematologic/Lymphatic: No anemia, purpura, petechiae. Allergic/Immunologic: No itchy/runny eyes, nasal congestion, recent allergic reactions, rashes  ALLERGIES: Allergies  Allergen Reactions  . Ace Inhibitors Swelling  . Lisinopril Swelling    Lower lip swelling    HOME MEDICATIONS:  Current Outpatient Medications:  .  amantadine (SYMMETREL) 100 MG capsule, Take 100 mg by mouth 2 (two) times daily., Disp: , Rfl: 2 .  amitriptyline (ELAVIL) 25 MG tablet, TAKE ONE OR TWO AT BEDTIME (Patient taking differently: Take 1 tablet by mouth daily), Disp: 60 tablet, Rfl: 9 .  amLODipine (NORVASC) 5 MG tablet, Take 1 tablet (5 mg total) by mouth daily., Disp: 90 tablet, Rfl: 0 .  amphetamine-dextroamphetamine (ADDERALL XR) 20 MG 24 hr capsule, Take 1 capsule (20 mg total) by mouth daily., Disp: 30 capsule, Rfl: 0 .  [START ON 09/26/2017] apixaban (ELIQUIS) 5 MG TABS tablet, Take 1 tablet (5 mg total) by mouth 2 (two) times daily., Disp: 60 tablet, Rfl: 0 .  apixaban (ELIQUIS) 5 MG TABS tablet, Take 2 tablets (10 mg total) by mouth 2 (two) times daily., Disp: 60 tablet, Rfl: 0 .  apixaban (ELIQUIS) 5 MG TABS tablet, Take 5 mg by mouth 2 (two) times daily., Disp: , Rfl:  .  baclofen (LIORESAL) 20 MG tablet, Take 1 tablet (20 mg total) by mouth 3 (three) times daily as needed (for ms). (Patient taking differently: Take 20 mg by mouth 3 (three) times daily. ), Disp: 270 each, Rfl: 3 .  busPIRone (BUSPAR) 15 MG tablet, Take 1 tablet (15 mg total) by mouth 2 (two) times daily., Disp: 60 tablet, Rfl: 11 .  citalopram (CELEXA) 20 MG tablet, Take 20 mg by mouth daily., Disp: , Rfl: 2 .  dalfampridine 10 MG TB12, Take 1 tablet (10 mg total) by  mouth 2 (two) times daily., Disp: 180 tablet, Rfl: 3 .  Dimethyl Fumarate (TECFIDERA) 240 MG CPDR, Take 240 mg by mouth 2 (two) times daily., Disp: , Rfl:  .  gabapentin (NEURONTIN) 800 MG tablet, Take 800 mg by mouth 3 (three) times daily as needed (for neuropathy if not relieved with lyrica). , Disp: , Rfl: 9 .  hydrOXYzine (ATARAX/VISTARIL) 10 MG tablet, Take 10 mg by mouth 3 (three) times daily as needed for anxiety., Disp: , Rfl: 2 .  lamoTRIgine (LAMICTAL) 200 MG tablet, Take 1 tablet (200 mg total) by mouth 2 (two) times daily., Disp: 60 tablet, Rfl: 11 .  Multiple Vitamin (MULTIVITAMIN WITH MINERALS) TABS tablet, Take 1 tablet by mouth daily., Disp: 30 tablet, Rfl: 0 .  Oxcarbazepine (TRILEPTAL) 300 MG tablet, Take 1 tablet (300 mg total) by mouth 2 (two) times daily., Disp: 60 tablet, Rfl: 1 .  oxyCODONE-acetaminophen (PERCOCET/ROXICET) 5-325 MG tablet, Take 1 tablet by mouth 4 (four) times daily., Disp: 30 tablet, Rfl: 0 .  pregabalin (LYRICA) 225 MG capsule, Take 1 capsule (225 mg total) by mouth 2 (two) times daily., Disp: 60 capsule, Rfl: 5 .  SUMAtriptan (IMITREX) 100 MG tablet, Take 1 tablet (100 mg total) by mouth once as needed for migraine. May repeat in 2 hours if headache persists or recurs., Disp: 10 tablet, Rfl: 3 .  tamsulosin (FLOMAX) 0.4 MG CAPS capsule, Take 1 capsule (0.4 mg total) by mouth daily., Disp: 30 capsule, Rfl: 11 .  tiZANidine (ZANAFLEX) 4 MG tablet, TAKE 1 TABLET BY MOUTH AT BEDTIME, Disp: 30 tablet, Rfl: 11 .  valACYclovir (VALTREX) 500 MG tablet, Take 1 tablet (500 mg total) by mouth 2 (two) times daily., Disp: 60 tablet, Rfl: 2 .  metoprolol tartrate (LOPRESSOR) 25 MG tablet, Take 0.5 tablets (12.5 mg total) by mouth 2 (two) times daily. (Patient taking differently: Take 25 mg by mouth 2 (two) times daily. ), Disp: 30 tablet, Rfl: 0  PAST MEDICAL HISTORY: Past Medical History:  Diagnosis Date  . Anxiety   . Depression   . Embolism ([place])   . Hypertension    . MS (multiple sclerosis) ([place])     PAST SURGICAL HISTORY: Past Surgical History:  Procedure Laterality Date  . BILATERAL HIP ARTHROSCOPY Left 07/2013  . JOINT REPLACEMENT Bilateral     R 2004 and L 2005    FAMILY HISTORY: Family History  Problem Relation Age of Onset  . Healthy Mother   . Healthy Father   . Breast cancer Unknown        paternal grandmother dx age 57  . Colon cancer Unknown        paternal grandmother  . Hypertension Other   . Diabetes Other     SOCIAL HISTORY:  Social History   Socioeconomic History  . Marital status: Single    Spouse name: Not on file  . Number of children: Not on file  . Years of education: Not on file  . Highest education level: Not on file  Occupational History  . Not on file  Social Needs  . Financial resource strain: Not on file  . Food insecurity:    Worry: Not on file    Inability: Not on file  . Transportation needs:    Medical: Not on file    Non-medical: Not on file  Tobacco Use  . Smoking status: Never Smoker  . Smokeless tobacco: Never Used  Substance and Sexual Activity  . Alcohol use: No  . Drug use: No  . Sexual activity: Not Currently  Birth control/protection: Pill  Lifestyle  . Physical activity:    Days per week: Not on file    Minutes per session: Not on file  . Stress: Not on file  Relationships  . Social connections:    Talks on phone: Not on file    Gets together: Not on file    Attends religious service: Not on file    Active member of club or organization: Not on file    Attends meetings of clubs or organizations: Not on file    Relationship status: Not on file  . Intimate partner violence:    Fear of current or ex partner: Not on file    Emotionally abused: Not on file    Physically abused: Not on file    Forced sexual activity: Not on file  Other Topics Concern  . Not on file  Social History Narrative  . Not on file     PHYSICAL EXAM  Vitals:   09/20/17 1457  BP: 132/87   Pulse: (!) 122  Resp: 18  Weight: 200 lb (90.7 kg)  Height: 5\' 5"  (1.651 m)    Body mass index is 33.28 kg/m.   General: The patient is well-developed and well-nourished and in no acute distress.  Musculoskeletal:   She has good neck ROM.    Neurologic Exam  Mental status: The patient is alert and oriented x 3 at the time of the examination. The patient has apparent normal recent and remote memory, with mildly reduced attention span and concentration ability.   Speech is normal.  Cranial nerves: Extraocular movements are full.  Facial strength and sensation is normal. Trapezius strength is normal.  The tongue is midline, and the patient has symmetric elevation of the soft palate. No obvious hearing deficits are noted.  Motor:  Muscle bulk is normal.   Muscle tone is increased in the left leg.. Strength is  5 / 5 in all 4 extremities.   Sensory: Touch and vibration sensation is normal in the arms or legs.  Coordination: Cerebellar testing reveals good finger-nose-finger with both hands.   Heel-to-shin is mildly reduced on the right side but she cannot perform on the left side.  Gait and station: She needs assistance to stand.    She can take a few steps without a cane but is off balance and has spasticity and left foot drop.  Reflexes: Deep tendon reflexes are increased at the knees, left greater than right. There is spread bilaterally. She has nonsustained clonus at the ankles, left greater than right.     DIAGNOSTIC DATA (LABS, IMAGING, TESTING) - I reviewed patient records, labs, notes, testing and imaging myself where available.  Lab Results  Component Value Date   WBC 6.2 09/19/2017   HGB 13.1 09/19/2017   HCT 38.7 09/19/2017   MCV 97.2 09/19/2017   PLT 260 09/19/2017      Component Value Date/Time   NA 141 09/19/2017 0435   NA 144 06/21/2017 1626   K 4.2 09/19/2017 0435   CL 106 09/19/2017 0435   CO2 26 09/19/2017 0435   GLUCOSE 99 09/19/2017 0435   BUN 13  09/19/2017 0435   BUN 16 06/21/2017 1626   CREATININE 1.38 (H) 09/19/2017 0435   CREATININE 0.85 07/03/2014 0959   CALCIUM 9.1 09/19/2017 0435   PROT 6.5 06/21/2017 1626   ALBUMIN 4.0 06/21/2017 1626   AST 26 06/21/2017 1626   ALT 26 06/21/2017 1626   ALKPHOS 104 06/21/2017 1626   BILITOT <  0.2 06/21/2017 1626   GFRNONAA 47 (L) 09/19/2017 0435   GFRNONAA 88 07/03/2014 0959   GFRAA 55 (L) 09/19/2017 0435   GFRAA >89 07/03/2014 0959   Lab Results  Component Value Date   CHOL 228 (H) 04/20/2017   HDL 77 04/20/2017   LDLCALC 142 (H) 04/20/2017   TRIG 46 04/20/2017   CHOLHDL 3.0 04/20/2017   Lab Results  Component Value Date   HGBA1C 5.3 04/20/2017   No results found for: VITAMINB12 Lab Results  Component Value Date   TSH 1.250 06/21/2017      ASSESSMENT AND PLAN  Multiple sclerosis (HCC)  Left leg weakness  Urinary disorder  Gait disturbance  High risk medication use   1.     She will continue Tecfidera.  Her last lymphocyte count was fine.   2.    Creatinine appears to have stabilized.  Anti-GBM antibodies have been tested earlier and were fine (this can be seen with Lemtrada use)  3.    Continue medications for anxiety, pain and spasticity.     4.    She will continue Xarelto for the DVT/PE.   5.    Return to see me in 3 months or sooner if there are new or worsening neurologic symptoms.   Marlia Schewe A. Felecia Shelling, MD, PhD 8/41/3244, 0:10 PM Certified in Neurology, Clinical Neurophysiology, Sleep Medicine, Pain Medicine and Neuroimaging  [facility] [address], [place], [place] [address] [phone] ]

## 2017-10-03 ENCOUNTER — Ambulatory Visit: Payer: Medicare Other

## 2017-10-03 ENCOUNTER — Emergency Department (HOSPITAL_COMMUNITY)
Admission: EM | Admit: 2017-10-03 | Discharge: 2017-10-03 | Disposition: A | Discharge status: Home / Self Care | Payer: Medicare Other | Attending: Emergency Medicine | Admitting: Emergency Medicine

## 2017-10-03 ENCOUNTER — Emergency Department (HOSPITAL_COMMUNITY): Payer: Medicare Other

## 2017-10-03 ENCOUNTER — Encounter (HOSPITAL_COMMUNITY): Payer: Self-pay | Admitting: Emergency Medicine

## 2017-10-03 ENCOUNTER — Other Ambulatory Visit: Payer: Self-pay

## 2017-10-03 DIAGNOSIS — R51 Headache: Secondary | ICD-10-CM | POA: Insufficient documentation

## 2017-10-03 DIAGNOSIS — N189 Chronic kidney disease, unspecified: Secondary | ICD-10-CM | POA: Insufficient documentation

## 2017-10-03 DIAGNOSIS — I129 Hypertensive chronic kidney disease with stage 1 through stage 4 chronic kidney disease, or unspecified chronic kidney disease: Secondary | ICD-10-CM | POA: Insufficient documentation

## 2017-10-03 DIAGNOSIS — G43909 Migraine, unspecified, not intractable, without status migrainosus: Secondary | ICD-10-CM | POA: Diagnosis present

## 2017-10-03 DIAGNOSIS — R519 Headache, unspecified: Secondary | ICD-10-CM

## 2017-10-03 DIAGNOSIS — Z79899 Other long term (current) drug therapy: Secondary | ICD-10-CM | POA: Diagnosis not present

## 2017-10-03 DIAGNOSIS — G35 Multiple sclerosis: Secondary | ICD-10-CM | POA: Diagnosis not present

## 2017-10-03 DIAGNOSIS — Z86711 Personal history of pulmonary embolism: Secondary | ICD-10-CM | POA: Insufficient documentation

## 2017-10-03 DIAGNOSIS — Z7901 Long term (current) use of anticoagulants: Secondary | ICD-10-CM | POA: Insufficient documentation

## 2017-10-03 MED ORDER — METOCLOPRAMIDE HCL 5 MG/ML IJ SOLN
10.0000 mg | Freq: Once | INTRAMUSCULAR | Status: AC
  Administered 2017-10-03: 10 mg via INTRAVENOUS

## 2017-10-03 MED ORDER — DIPHENHYDRAMINE HCL 50 MG/ML IJ SOLN
INTRAMUSCULAR | Status: AC
  Filled 2017-10-03: qty 1

## 2017-10-03 MED ORDER — SODIUM CHLORIDE 0.9 % IV BOLUS
1000.0000 mL | Freq: Once | INTRAVENOUS | Status: AC
  Administered 2017-10-03: 1000 mL via INTRAVENOUS

## 2017-10-03 MED ORDER — DIPHENHYDRAMINE HCL 50 MG/ML IJ SOLN
50.0000 mg | Freq: Once | INTRAMUSCULAR | Status: AC
  Administered 2017-10-03: 50 mg via INTRAVENOUS

## 2017-10-03 MED ORDER — METOCLOPRAMIDE HCL 5 MG/ML IJ SOLN
INTRAMUSCULAR | Status: AC
  Filled 2017-10-03: qty 2

## 2017-10-03 MED ORDER — LORAZEPAM 2 MG/ML IJ SOLN
0.5000 mg | Freq: Once | INTRAMUSCULAR | Status: AC
  Administered 2017-10-03: 0.5 mg via INTRAVENOUS
  Filled 2017-10-03: qty 1

## 2017-10-03 NOTE — ED Triage Notes (Signed)
Pt reports headache for the last 2 days with pain in center and temples. Pt reports no relief with home medications.

## 2017-10-03 NOTE — ED Notes (Signed)
Patient transported to CT 

## 2017-10-03 NOTE — Discharge Instructions (Signed)
Please talk to your doctor about today's headache.  They may want to change your migraine medications if they continue to not help you at home.  Please return to emergency department if you develop any new or worsening symptoms.

## 2017-10-03 NOTE — ED Provider Notes (Signed)
[facility] DEPT Provider Note   CSN: [id number] Arrival date & time: 10/03/17  0011     History   Chief Complaint Chief Complaint  Patient presents with  . Migraine    HPI Ivanna KIANDRIA CLUM is a 37 y.o. 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  Patient reports her headache feels different than her normal.  She is concerned about her recent diagnosis and that may be related to her headache.  She denies any vision changes, but does have photophobia.  She had some nausea, but no vomiting.  She denies any neck pain or fevers.  She does report falling and hitting her head 3 days ago, but she did not lose consciousness.  HPI  Past Medical History:  Diagnosis Date  . Anxiety   . Depression   . Embolism ([place])   . Hypertension   . MS (multiple sclerosis) [facility])     Patient Active Problem List   Diagnosis Date Noted  . Pulmonary embolism ([place]) 09/18/2017  . Elevated serum creatinine 05/12/2017  . Acute encephalopathy 04/20/2017  . Hydronephrosis of left kidney 10/24/2016  . Attention deficit 10/24/2016  . Left leg weakness 08/13/2016  . Chronic pain 08/13/2016  . Mild renal insufficiency 08/13/2016  . Left-sided weakness   . High risk medication use 09/15/2015  . Hip pain, bilateral 07/28/2015  . Urinary hesitancy 07/28/2015  . Multiple sclerosis exacerbation ([place]) 07/17/2015  . Urinary disorder 06/11/2015  . Gait disturbance 05/19/2015  . Numbness 05/19/2015  . Depression with anxiety 05/13/2015  . Other fatigue 05/13/2015  . Left knee pain 05/13/2015  . Avascular necrosis of bones of both hips ([place]) 05/13/2015  . Screening for HIV (human immunodeficiency virus) 07/08/2014  . Essential hypertension, benign 11/19/2013  . Anxiety state, unspecified 11/19/2013  . Screen for STD (sexually transmitted disease) 11/01/2013  .  Encounter for routine gynecological examination 11/01/2013  . Oral contraceptive use 10/20/2011  . Multiple sclerosis ([place]) 08/08/2007  . RASH AND OTHER NONSPECIFIC SKIN ERUPTION 06/22/2007  . AVASCULAR NECROSIS, FEMORAL HEAD 03/11/2006  . Essential hypertension 02/03/2006  . 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 DVT, HX OF 02/03/2006    Past Surgical History:  Procedure Laterality Date  . BILATERAL HIP ARTHROSCOPY Left 07/2013  . JOINT REPLACEMENT Bilateral     R 2004 and L 2005     OB History    Gravida  1   Para  1   Term  1   Preterm      AB      Living  1     SAB      TAB      Ectopic      Multiple      Live Births  1            Home Medications    Prior to Admission medications   Medication Sig Start Date End Date Taking? Authorizing Provider  amantadine (SYMMETREL) 100 MG capsule Take 100 mg by mouth 2 (two) times daily. 09/11/17  Yes [provider]  amitriptyline (ELAVIL) 25 MG tablet TAKE ONE OR TWO AT BEDTIME Patient taking differently: Take 1 tablet by mouth daily 11/09/16  Yes Sater, Nanine Means, MD  amLODipine (NORVASC) 5 MG tablet Take 1 tablet (5 mg total) by mouth daily. 05/21/17  Yes Arrien, Jimmy Picket, MD  amphetamine-dextroamphetamine (ADDERALL XR) 20 MG 24 hr capsule Take  1 capsule (20 mg total) by mouth daily. 09/20/17  Yes Sater, Nanine Means, MD  apixaban (ELIQUIS) 5 MG TABS tablet Take 1 tablet (5 mg total) by mouth 2 (two) times daily. 09/26/17  Yes Kayleen Memos, DO  baclofen (LIORESAL) 20 MG tablet Take 1 tablet (20 mg total) by mouth 3 (three) times daily as needed (for ms). Patient taking differently: Take 20 mg by mouth 4 (four) times daily.  08/03/17  Yes Sater, Nanine Means, MD  busPIRone (BUSPAR) 15 MG tablet Take 1 tablet (15 mg total) by mouth 2 (two) times daily. 08/02/17  Yes Sater, Nanine Means, MD  citalopram (CELEXA) 20 MG tablet Take 20 mg by mouth daily. 09/11/17  Yes [provider]  dalfampridine 10 MG TB12 Take 1  tablet (10 mg total) by mouth 2 (two) times daily. 08/18/17  Yes Sater, Nanine Means, MD  Dimethyl Fumarate (TECFIDERA) 240 MG CPDR Take 240 mg by mouth 2 (two) times daily.   Yes [provider]  hydrOXYzine (ATARAX/VISTARIL) 10 MG tablet Take 10 mg by mouth 3 (three) times daily as needed for anxiety. 09/11/17  Yes [provider]  lamoTRIgine (LAMICTAL) 200 MG tablet Take 1 tablet (200 mg total) by mouth 2 (two) times daily. 08/05/16  Yes Sater, Nanine Means, MD  metoprolol tartrate (LOPRESSOR) 25 MG tablet Take 0.5 tablets (12.5 mg total) by mouth 2 (two) times daily. Patient taking differently: Take 25 mg by mouth 2 (two) times daily.  05/21/17 10/03/17 Yes Arrien, Jimmy Picket, MD  Oxcarbazepine (TRILEPTAL) 300 MG tablet Take 1 tablet (300 mg total) by mouth 2 (two) times daily. 11/11/15  Yes Sater, Nanine Means, MD  oxyCODONE-acetaminophen (PERCOCET/ROXICET) 5-325 MG tablet Take 1 tablet by mouth 4 (four) times daily. 04/23/17  Yes Theodis Blaze, MD  pregabalin (LYRICA) 225 MG capsule Take 1 capsule (225 mg total) by mouth 2 (two) times daily. 09/20/17  Yes Sater, Nanine Means, MD  SUMAtriptan (IMITREX) 100 MG tablet Take 1 tablet (100 mg total) by mouth once as needed for migraine. May repeat in 2 hours if headache persists or recurs. 12/28/16  Yes Sater, Nanine Means, MD  tamsulosin (FLOMAX) 0.4 MG CAPS capsule Take 1 capsule (0.4 mg total) by mouth daily. 09/20/17  Yes Sater, Nanine Means, MD  tiZANidine (ZANAFLEX) 4 MG tablet TAKE 1 TABLET BY MOUTH AT BEDTIME 10/18/16  Yes Sater, Nanine Means, MD  valACYclovir (VALTREX) 500 MG tablet Take 1 tablet (500 mg total) by mouth 2 (two) times daily. 03/20/17  Yes Sater, Nanine Means, MD  Multiple Vitamin (MULTIVITAMIN WITH MINERALS) TABS tablet Take 1 tablet by mouth daily. Patient not taking: Reported on 10/03/2017 09/20/17   Kayleen Memos, DO    Family History Family History  Problem Relation Age of Onset  . Healthy Mother   . Healthy Father   . Breast  cancer Unknown        paternal grandmother dx age 65  . Colon cancer Unknown        paternal grandmother  . Hypertension Other   . Diabetes Other     Social History Social History   Tobacco Use  . Smoking status: Never Smoker  . Smokeless tobacco: Never Used  Substance Use Topics  . Alcohol use: No  . Drug use: No     Allergies   Ace inhibitors and Lisinopril   Review of Systems Review of Systems  Constitutional: Negative for chills and fever.  HENT: Negative for facial swelling and  sore throat.   Eyes: Positive for photophobia. Negative for visual disturbance.  Respiratory: Negative for shortness of breath.   Cardiovascular: Negative for chest pain.  Gastrointestinal: Positive for nausea. Negative for abdominal pain and vomiting.  Genitourinary: Negative for dysuria.  Musculoskeletal: Negative for back pain.  Skin: Negative for rash and wound.  Neurological: Positive for headaches.  Psychiatric/Behavioral: The patient is not nervous/anxious.      Physical Exam Updated Vital Signs BP 135/90   Pulse 83   Temp 98.6 F (37 C) (Oral)   Resp 15   Ht 5\' 5"  (1.651 m)   Wt 88.5 kg (195 lb)   LMP 10/02/2017   SpO2 99%   BMI 32.45 kg/m   Physical Exam  Constitutional: She appears well-developed and well-nourished. No distress.  HENT:  Head: Normocephalic and atraumatic.  Mouth/Throat: Oropharynx is clear and moist. No oropharyngeal exudate.  Eyes: Pupils are equal, round, and reactive to light. Conjunctivae and EOM are normal. Right eye exhibits no discharge. Left eye exhibits no discharge. No scleral icterus.  Neck: Normal range of motion. Neck supple. No thyromegaly present.  Chin to chest without difficulty  Cardiovascular: Normal rate, regular rhythm, normal heart sounds and intact distal pulses. Exam reveals no gallop and no friction rub.  No murmur heard. Pulmonary/Chest: Effort normal and breath sounds normal. No stridor. No respiratory distress. She has  no wheezes. She has no rales.  Abdominal: Soft. Bowel sounds are normal. She exhibits no distension. There is no tenderness. There is no rebound and no guarding.  Musculoskeletal: She exhibits no edema.  Lymphadenopathy:    She has no cervical adenopathy.  Neurological: She is alert. Coordination normal.  CN 3-12 intact; normal sensation throughout; 5/5 strength in all 4 extremities; equal bilateral grip strength  Skin: Skin is warm and dry. No rash noted. She is not diaphoretic. No pallor.  Psychiatric: She has a normal mood and affect.  Nursing note and vitals reviewed.    ED Treatments / Results  Labs (all labs ordered are listed, but only abnormal results are displayed) Labs Reviewed - No data to display  EKG None  Radiology Ct Head Wo Contrast  Result Date: 10/03/2017 CLINICAL DATA:  Headache x2 days, fall, head injury, on blood thinners EXAM: CT HEAD WITHOUT CONTRAST TECHNIQUE: Contiguous axial images were obtained from the base of the skull through the vertex without intravenous contrast. COMPARISON:  MRI brain dated 05/26/2017 FINDINGS: Brain: No evidence of acute infarction, hemorrhage, hydrocephalus, extra-axial collection or mass lesion/mass effect. Subcortical white matter and periventricular small vessel ischemic changes. Vascular: No hyperdense vessel or unexpected calcification. Skull: Normal. Negative for fracture or focal lesion. Sinuses/Orbits: The visualized paranasal sinuses are essentially clear. The mastoid air cells are unopacified. Other: None. IMPRESSION: No evidence of acute intracranial abnormality. Small vessel ischemic changes. Electronically Signed   By: [name] M.D.   On: 10/03/2017 02:54    Procedures Procedures (including critical care time)  Medications Ordered in ED Medications  sodium chloride 0.9 % bolus 1,000 mL (1,000 mLs Intravenous New Bag/Given 10/03/17 0307)  metoCLOPramide (REGLAN) injection 10 mg (10 mg Intravenous Given 10/03/17  0308)  diphenhydrAMINE (BENADRYL) injection 50 mg (50 mg Intravenous Given 10/03/17 0307)  LORazepam (ATIVAN) injection 0.5 mg (0.5 mg Intravenous Given 10/03/17 0339)     Initial Impression / Assessment and Plan / ED Course  I have reviewed the triage vital signs and the nursing notes.  Pertinent labs & imaging results that were available during my  care of the patient were reviewed by me and considered in my medical decision making (see chart for details).     Patient with history of headaches who presents with headache for the past 2 days.  Patient reports a fall prior to this and hitting her head.  She was recently started on Eliquis for PE and DVT.  Patient denies any vision changes.  Normal neuro exam without focal deficits.  Patient is feeling much better after Reglan, Benadryl, fluids in the ED.  She did get anxious after the IV medications, which she states happens a lot.  Ativan given and patient feeling much better.  She is ready to go home and rest.  Advised her to speak with her doctor about today's visit and discuss her prophylactic medications considering her Imitrex did not help her at home.  Return precautions discussed.  Patient understands and agrees with plan.  Patient vitals stable throughout ED course and discharged in satisfactory condition.  Final Clinical Impressions(s) / ED Diagnoses   Final diagnoses:  Bad headache    ED Discharge Orders    None       Frederica Kuster, PA-C 10/03/17 [id number]    Varney Biles, MD 10/04/17 [phone]

## 2017-10-14 ENCOUNTER — Other Ambulatory Visit: Payer: Self-pay | Admitting: Neurology

## 2017-10-16 ENCOUNTER — Telehealth: Payer: Self-pay | Admitting: Neurology

## 2017-10-16 NOTE — Telephone Encounter (Signed)
Pt is requesting labs. She said it was taken about 2 weeks ago in her home. Please call to advise

## 2017-10-16 NOTE — Telephone Encounter (Signed)
Spoke with [employer] and reviewed recent [place] labs. S. creatinine is down some (from 1.33 to 1.25).  She continues f/u with urology for same./fim

## 2017-10-18 ENCOUNTER — Telehealth: Payer: Self-pay | Admitting: Neurology

## 2017-10-18 NOTE — Telephone Encounter (Signed)
Pt requesting a call stating she is needing to come in for an appt to discuss a new medication. When advised the next available appt would be in October pt stated that was no doable. Offered to send a tele note to RN to discuss pt stating the RN "cannot help but I will try". Please call to advise.

## 2017-10-19 ENCOUNTER — Ambulatory Visit (INDEPENDENT_AMBULATORY_CARE_PROVIDER_SITE_OTHER): Payer: Medicare Other | Admitting: Neurology

## 2017-10-19 ENCOUNTER — Encounter: Payer: Self-pay | Admitting: Neurology

## 2017-10-19 VITALS — BP 106/61 | HR 94 | Resp 18 | Ht 65.0 in | Wt 208.0 lb

## 2017-10-19 DIAGNOSIS — G35 Multiple sclerosis: Secondary | ICD-10-CM | POA: Diagnosis not present

## 2017-10-19 DIAGNOSIS — G8929 Other chronic pain: Secondary | ICD-10-CM

## 2017-10-19 DIAGNOSIS — R269 Unspecified abnormalities of gait and mobility: Secondary | ICD-10-CM | POA: Diagnosis not present

## 2017-10-19 DIAGNOSIS — R531 Weakness: Secondary | ICD-10-CM | POA: Diagnosis not present

## 2017-10-19 DIAGNOSIS — R5383 Other fatigue: Secondary | ICD-10-CM

## 2017-10-19 MED ORDER — GABAPENTIN 800 MG PO TABS: 800.0000 mg | ORAL_TABLET | Freq: Three times a day (TID) | ORAL | 5 refills | Status: DC

## 2017-10-19 NOTE — Progress Notes (Signed)
Some   [facility]  PATIENT: Dawn Peters DOB: [birth year]  REFERRING DOCTOR OR PCP:  Dr. Annitta Needs SOURCE: Patient, records in EMR, lab results and radiology reports in EMR, MRI images reviewed on PACS.  _________________________________   HISTORICAL  CHIEF COMPLAINT:  Chief Complaint  Patient presents with  . Multiple Sclerosis    Reports compliance with Tecfidera. Sts. balance, right leg spasticity has been worse over the last week./fim    HISTORY OF PRESENT ILLNESS:   [name] is a 37 year-old woman who was diagnosed with MS in 2000.     Update 10/19/2017: She switched over to Tecfidera earlier this year.  She tolerates it well.     She is noting more weakness and spasticity in the left leg.  Balance is poor.  She needs to use a cane.  In the past, she has felt better when she received a couple days of IV steroids.  She notes a little bit more spasticity in the legs.   She takes baclofen and tizanidine 4 times a day and is unsure how much they are helping.     She is noting more leg pain.   She felt leg pain did better on gabapentin than on Lyrica.  She also notes more fatigue the last couple weeks.  She is sleeping well most nights.  She has some urinary frequency but feels this is stable.  She is still on Eliquis.  She had an Ultrasound to check up on the clot this morning.   Her migraine headaches are doing well  Update 09/20/2017: She had two cycles of Lemtrada (2017 and 2018) and switched to Tecfidera a few months ago as she was still having progression (has active SPMS) and  She has left sided leg weakness and spasticity and has reduced gait.     She uses a cane.     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     She has some fatigue but this is stable.    She sleeps well most nights.    Migraines are doing well.     Her creatinine has ben  mildly elevated 1.3 to 1.4 but stable   Anti-GBM Ab was fine.  Update 06/21/2017: She has an active form of SPMS.  She re-presented with worsening left sided weakness and more trouble walking a few days after her last visit.  Imaging studies did not show any new lesions.  She was given IV solu-medrol but did not feel she got any better.   She then was d/c to [facility].     Her last Lemtrada was 10/10/2016-10/12/16.      She did not follow up with nephrology appointment scheduled for March.   Creatinine has been increased.   During last admission, she was evaluated by medicine and received IV fluids and creatinine improved to 1.39-1.41.  One year ago creatinine was 0.9.      Patients with [name] can have anti-glomerular membrane antibodies and this was checked 05/17/2017 and was normal.   There also have been a few cases of other glomerulonephropathy seen in clinical studies and post-marketing data.     She is not on any NSAID's.   Lab and imaging reports, MRI images on PACS and admission discharge notes from her recent admission were  reviewed  Update 05/17/2017:   She had a second course of Lemtrada in August 2018.   She notes her left leg is worse.  It is weaker and she is falling more.  She was in the hospital last month due to hypersomnia and generalized weakness. No definite etiology was determined. Drug screen was negative. Urinalysis showed small amount of hemoglobin but no protein or casts.   Repeat brain MRI showed no new lesions.   She received 3 days of IV Solu-Medrol.  Admission and discharge notes were reviewed.Her creatinine has been elevated with the last reading 1.52.    She is scheduled to see nephrology next week.  She also has apparent hydronephrosis on MRI and I referred her to urology last October but she does not recall going.   She is on Ampyra and feels it helps her strength some.     Update 12/28/2016:   She had her 3 day course of Lemtrada in August. She tolerated it well.  She has not had any exacerbations while on Lemtrada for the MS. Her last MRI was in June 2018 and did not show any new lesions. She continues to have difficulty with her gait due to the left leg weakness and spasticity. An AFO initially helped her walk but she is having trouble with fit that she believes is worse due to weight gain. Balance is also mildly off. Spasticity is predominantly in the left leg she gets some benefit with baclofen and tizanidine.    She has painful dysesthesias of the left leg helped partially by gabapentin and lamotrigine. She also sees pain management for opiates. She has worsening urinary hesitancy this year and an MRI of the thoracic spine showed hydronephrosis. She has fatigue is both physical and cognitive. It is worse in the heat so she feels it is doing better than a couple months ago. Well most nights. Mood has done better with citalopram and BuSpar.  ' From 10/24/2016: MS:   She had her 3 day course of Lemtrada in the office a couple weeks ago and thee infusions went well. . She is JCV high titer positive and had switched from Tysabri to [place] earlier.     Gait/strength:    Gait has worsened due mostly to the left leg weakness and spasticity over the last year. She got her left AFO and is starting to get used to it.   She has trouble lifting the left leg/foot.  Ba;lance is off.     She is on Ampyra with some benefit.  Arms are doing ok.  She has left sided spasticity, only partially helped by baclofen and tizanidine.  Dysesthesia:   Patient continues to report painful left leg dysesthesias. These are similar to the last visit.  .  She is currently on gabapentin 800 mg by mouth 4 times a day and lamotrigine 200 mg twice a day for the dysesthetic pain.  With her medications and tramadol she feels that the pain is tolerable.   Vision:    She denies any MS related visual problems.  Bladder:   Urinary hesitancy continues to be a problem. She gets some benefit from  tamsulosin. MRI of the thoracic spine with recent hospitalization shows hydronephrosis on the left.  She has not yet been referred to urology.  Bowel is fine..  Fatigue/sleep:   She is noting more trouble with her fatigue, no occurring daily.  . She does especially poorly in the hear.  She denies sleepiness -- issues are  more fatigue.   She has never tried a stimulant.   She sleeps well most nights.  Mood:  Her depression and anxiety is better with citalopram 40 mg and buspar bid.    Anxiety is more of a problem than depression and she feels that the medications are not completely controlling this.  Cognition:   She denies significant difficulties with her cognitive abilities.   Focus is mildly reduced.  MS history:   She was diagnosed with MS in 2000 after presenting with gait difficulties, numbness and headaches. An MRI of the brain was consistent with MS. She was then started on Betaseron. She had difficulty tolerating Betaseron and at some point switched to Novantrone. She took Novantrone every 3 months for a year or so. A little later, she was placed on Tysabri and she stayed on it for a couple of years. However, she was JCV-positive and switched off. She did well on Tysabri. She had been on Tecfidera since 2015. Her MRI from June 2015 showed that there had been no changes compared to an MRI from 2014.   However, she had an exacerbation March 2017.   The 08/07/2013 MRI of the brain shows multiple T2/FLAIR hyperintense foci located in the cerebellum, middle cerebellar peduncles, pons and in the periventricular white matter of the hemispheres in a pattern and configuration consistent with chronic demyelinating plaque associated with MS. There were no acute findings. There was no change compared to the previous MRI from 01/16/2013 that was also reviewed. An MRI of the cervical spine shows subtle T2 hyperintense signal within the cord and there were no acute findings.  REVIEW OF SYSTEMS: Constitutional:  No fevers, chills, sweats, or change in appetite.   She has fatigue.  Some insomnia. Eyes: No visual changes, double vision, eye pain Ear, nose and throat: No hearing loss, ear pain, nasal congestion, sore throat Cardiovascular: No chest pain, palpitations Respiratory: No shortness of breath at rest or with exertion.   No wheezes GastrointestinaI: No nausea, vomiting, diarrhea, abdominal pain, fecal incontinence Genitourinary: No dysuria.   She has hesitancy. Sometimes she has difficulty emptying completely Flomax has helped urinary hesitancy. 1 x nocturia. Musculoskeletal:She notes pain in the left leg and some back pain Integumentary: No rash, pruritus, skin lesions Neurological: as above Psychiatric: Mild depression at this time.  Moderate anxiety Endocrine: No palpitations, diaphoresis, change in appetite, change in weigh or increased thirst Hematologic/Lymphatic: No anemia, purpura, petechiae. Allergic/Immunologic: No itchy/runny eyes, nasal congestion, recent allergic reactions, rashes  ALLERGIES: Allergies  Allergen Reactions  . Ace Inhibitors Swelling  . Lisinopril Swelling    Lower lip swelling    HOME MEDICATIONS:  Current Outpatient Medications:  .  amantadine (SYMMETREL) 100 MG capsule, Take 100 mg by mouth 2 (two) times daily., Disp: , Rfl: 2 .  amitriptyline (ELAVIL) 25 MG tablet, TAKE ONE OR TWO AT BEDTIME (Patient taking differently: Take 1 tablet by mouth daily), Disp: 60 tablet, Rfl: 9 .  amLODipine (NORVASC) 5 MG tablet, Take 1 tablet (5 mg total) by mouth daily., Disp: 90 tablet, Rfl: 0 .  amphetamine-dextroamphetamine (ADDERALL XR) 20 MG 24 hr capsule, Take 1 capsule (20 mg total) by mouth daily., Disp: 30 capsule, Rfl: 0 .  apixaban (ELIQUIS) 5 MG TABS tablet, Take 1 tablet (5 mg total) by mouth 2 (two) times daily., Disp: 60 tablet, Rfl: 0 .  baclofen (LIORESAL) 20 MG tablet, Take 1 tablet (20 mg total) by mouth 3 (three) times daily as needed (for ms).  (  Patient taking differently: Take 20 mg by mouth 4 (four) times daily. ), Disp: 270 each, Rfl: 3 .  busPIRone (BUSPAR) 15 MG tablet, Take 1 tablet (15 mg total) by mouth 2 (two) times daily., Disp: 60 tablet, Rfl: 11 .  citalopram (CELEXA) 20 MG tablet, Take 20 mg by mouth daily., Disp: , Rfl: 2 .  dalfampridine 10 MG TB12, Take 1 tablet (10 mg total) by mouth 2 (two) times daily., Disp: 180 tablet, Rfl: 3 .  Dimethyl Fumarate (TECFIDERA) 240 MG CPDR, Take 240 mg by mouth 2 (two) times daily., Disp: , Rfl:  .  hydrOXYzine (ATARAX/VISTARIL) 10 MG tablet, Take 10 mg by mouth 3 (three) times daily as needed for anxiety., Disp: , Rfl: 2 .  lamoTRIgine (LAMICTAL) 200 MG tablet, Take 1 tablet (200 mg total) by mouth 2 (two) times daily., Disp: 60 tablet, Rfl: 11 .  Multiple Vitamin (MULTIVITAMIN WITH MINERALS) TABS tablet, Take 1 tablet by mouth daily., Disp: 30 tablet, Rfl: 0 .  Oxcarbazepine (TRILEPTAL) 300 MG tablet, Take 1 tablet (300 mg total) by mouth 2 (two) times daily., Disp: 60 tablet, Rfl: 1 .  oxyCODONE-acetaminophen (PERCOCET/ROXICET) 5-325 MG tablet, Take 1 tablet by mouth 4 (four) times daily., Disp: 30 tablet, Rfl: 0 .  pregabalin (LYRICA) 225 MG capsule, Take 1 capsule (225 mg total) by mouth 2 (two) times daily., Disp: 60 capsule, Rfl: 5 .  SUMAtriptan (IMITREX) 100 MG tablet, Take 1 tablet (100 mg total) by mouth once as needed for migraine. May repeat in 2 hours if headache persists or recurs., Disp: 10 tablet, Rfl: 3 .  tamsulosin (FLOMAX) 0.4 MG CAPS capsule, Take 1 capsule (0.4 mg total) by mouth daily., Disp: 30 capsule, Rfl: 11 .  tiZANidine (ZANAFLEX) 4 MG tablet, TAKE 1 TABLET BY MOUTH AT BEDTIME, Disp: 30 tablet, Rfl: 11 .  valACYclovir (VALTREX) 500 MG tablet, Take 1 tablet (500 mg total) by mouth 2 (two) times daily., Disp: 60 tablet, Rfl: 2 .  gabapentin (NEURONTIN) 800 MG tablet, Take 1 tablet (800 mg total) by mouth 3 (three) times daily., Disp: 90 tablet, Rfl: 5 .   metoprolol tartrate (LOPRESSOR) 25 MG tablet, Take 0.5 tablets (12.5 mg total) by mouth 2 (two) times daily. (Patient taking differently: Take 25 mg by mouth 2 (two) times daily. ), Disp: 30 tablet, Rfl: 0  PAST MEDICAL HISTORY: Past Medical History:  Diagnosis Date  . Anxiety   . Depression   . Embolism ([place])   . Hypertension   . MS (multiple sclerosis) ([place])     PAST SURGICAL HISTORY: Past Surgical History:  Procedure Laterality Date  . BILATERAL HIP ARTHROSCOPY Left 07/2013  . JOINT REPLACEMENT Bilateral     R 2004 and L 2005    FAMILY HISTORY: Family History  Problem Relation Age of Onset  . Healthy Mother   . Healthy Father   . Breast cancer Unknown        paternal grandmother dx age 30  . Colon cancer Unknown        paternal grandmother  . Hypertension Other   . Diabetes Other     SOCIAL HISTORY:  Social History   Socioeconomic History  . Marital status: Single    Spouse name: Not on file  . Number of children: Not on file  . Years of education: Not on file  . Highest education level: Not on file  Occupational History  . Not on file  Social Needs  . Financial resource strain:  Not on file  . Food insecurity:    Worry: Not on file    Inability: Not on file  . Transportation needs:    Medical: Not on file    Non-medical: Not on file  Tobacco Use  . Smoking status: Never Smoker  . Smokeless tobacco: Never Used  Substance and Sexual Activity  . Alcohol use: No  . Drug use: No  . Sexual activity: Not Currently    Birth control/protection: Pill  Lifestyle  . Physical activity:    Days per week: Not on file    Minutes per session: Not on file  . Stress: Not on file  Relationships  . Social connections:    Talks on phone: Not on file    Gets together: Not on file    Attends religious service: Not on file    Active member of club or organization: Not on file    Attends meetings of clubs or organizations: Not on file    Relationship status: Not on  file  . Intimate partner violence:    Fear of current or ex partner: Not on file    Emotionally abused: Not on file    Physically abused: Not on file    Forced sexual activity: Not on file  Other Topics Concern  . Not on file  Social History Narrative  . Not on file     PHYSICAL EXAM  Vitals:   10/19/17 1509  BP: 106/61  Pulse: 94  Resp: 18  Weight: 208 lb (94.3 kg)  Height: 5\' 5"  (1.651 m)    Body mass index is 34.61 kg/m.   General: The patient is well-developed and well-nourished and in no acute distress.  Musculoskeletal:   She has good neck ROM.    Neurologic Exam  Mental status: The patient is alert and oriented x 3 at the time of the examination. The patient has apparent normal recent and remote memory, with mildly reduced attention span and concentration ability.   Speech is normal.  Cranial nerves: Extraocular movements are full.  Facial strength and sensation is normal.  Trapezius strength is normal.. No obvious hearing deficits are noted.  Motor:  Muscle bulk is normal.   Muscle tone is increased in the left leg.. Strength is  5 / 5 in all 4 extremities.   Sensory: Touch and vibration sensation is normal in the arms or legs.  Coordination: Cerebellar testing reveals good finger-nose-finger with both hands.   Heel-to-shin is mildly reduced on the right side but she cannot perform on the left side.  Gait and station: Station is normal.  She can walk without her cane balance is poor.  She has spasticity in the left when she walks..  There is a left foot drop.  She cannot tandem walk.  She walks better with a cane and without.  Reflexes: Deep tendon reflexes are increased in the legs, left greater than right.  She has crossed abductor responses, worse on the left.  There are a couple beats of nonsustained clonus on the left.    DIAGNOSTIC DATA (LABS, IMAGING, TESTING) - I reviewed patient records, labs, notes, testing and imaging myself where  available.  Lab Results  Component Value Date   WBC 6.2 09/19/2017   HGB 13.1 09/19/2017   HCT 38.7 09/19/2017   MCV 97.2 09/19/2017   PLT 260 09/19/2017      Component Value Date/Time   NA 141 09/19/2017 0435   NA 144 06/21/2017 1626   K  4.2 09/19/2017 0435   CL 106 09/19/2017 0435   CO2 26 09/19/2017 0435   GLUCOSE 99 09/19/2017 0435   BUN 13 09/19/2017 0435   BUN 16 06/21/2017 1626   CREATININE 1.38 (H) 09/19/2017 0435   CREATININE 0.85 07/03/2014 0959   CALCIUM 9.1 09/19/2017 0435   PROT 6.5 06/21/2017 1626   ALBUMIN 4.0 06/21/2017 1626   AST 26 06/21/2017 1626   ALT 26 06/21/2017 1626   ALKPHOS 104 06/21/2017 1626   BILITOT <0.2 06/21/2017 1626   GFRNONAA 47 (L) 09/19/2017 0435   GFRNONAA 88 07/03/2014 0959   GFRAA 55 (L) 09/19/2017 0435   GFRAA >89 07/03/2014 0959   Lab Results  Component Value Date   CHOL 228 (H) 04/20/2017   HDL 77 04/20/2017   LDLCALC 142 (H) 04/20/2017   TRIG 46 04/20/2017   CHOLHDL 3.0 04/20/2017   Lab Results  Component Value Date   HGBA1C 5.3 04/20/2017   No results found for: VITAMINB12 Lab Results  Component Value Date   TSH 1.250 06/21/2017      ASSESSMENT AND PLAN  Multiple sclerosis (HCC)  Left-sided weakness  Other fatigue  Gait disturbance  Other chronic pain   1.    Continue Tecfidera.  She will get 2 days of IV Solu-Medrol 1 g each day to see if that helps her symptoms. 2.     Continue medications for anxiety, pain and spasticity.    Change over from Lyrica to gabapentin. 3.    Try to stay active. 4.    She will continue Xarelto for the DVT/PE.   5.    Return to see me in 3 months or sooner if there are new or worsening neurologic symptoms.   Tra Wilemon A. Felecia Shelling, MD, PhD 7/35/6701, 4:10 PM Certified in Neurology, Clinical Neurophysiology, Sleep Medicine, Pain Medicine and Neuroimaging  [facility] [address] [place], [place] [address] [phone] ]

## 2017-10-19 NOTE — Telephone Encounter (Signed)
Spoke with Dawn Peters.  She sts. legs are weaker, left worse than right. We discussed that this has been her most bothersome MS sx. but she sts. this is different than her usual weakness and believes it is related to starting Tecfidera. Sts. she would like to see Dr. Felecia Shelling to discuss.  Appt. given 3pm today, arrival time of 2:30pm/fim

## 2017-11-01 ENCOUNTER — Other Ambulatory Visit: Payer: Self-pay | Admitting: Neurology

## 2017-11-01 NOTE — Telephone Encounter (Signed)
Pt requesting refills for amphetamine-dextroamphetamine (ADDERALL XR) 20 MG 24 hr capsule sent to [facility] on [address]

## 2017-11-02 MED ORDER — AMPHETAMINE-DEXTROAMPHET ER 20 MG PO CP24: 20.0000 mg | ORAL_CAPSULE | Freq: Every day | ORAL | 0 refills | Status: DC

## 2017-11-09 ENCOUNTER — Other Ambulatory Visit: Payer: Self-pay | Admitting: Urology

## 2017-11-14 ENCOUNTER — Telehealth: Payer: Self-pay | Admitting: Neurology

## 2017-11-14 NOTE — Telephone Encounter (Signed)
Pt called requesting a prescription prednisone sent to [facility] on [address]. To help pt walk and control her balance

## 2017-11-14 NOTE — Telephone Encounter (Signed)
Dawn Peters.  She has active SPMS. RAS has reiterated that she will likely continue to have progressive decline in gait/blance, and will also have good days/bad days. She was last seen in August for same and had 2 days of IV SM at that time./fim

## 2017-11-14 NOTE — Telephone Encounter (Signed)
Pt requesting a call, stating she is needing to be seen sooner. Stating that if she is standing in one spot for too long she will fall. Also stating the only relief she gets is from prednisone. Please advise

## 2017-11-15 NOTE — Telephone Encounter (Signed)
LMOM that RAS is out of the office this week, and the on call physician is not comfortable rx'ing Prednisone, due to recent IV SM in our office, risk to bone health. Will speak with RAS when he returns and call pt. back/fim

## 2017-11-16 MED ORDER — METHYLPREDNISOLONE 4 MG PO TABS: ORAL_TABLET | ORAL | 0 refills | Status: DC

## 2017-11-16 NOTE — Telephone Encounter (Signed)
Per RAS, ok for Medrol 4mg  dose pk. I spoke with Doree and let her know. Rx. escribed to [facility] per her request/fim

## 2017-11-16 NOTE — Addendum Note (Signed)
Addended by: France Ravens I on: 11/16/2017 01:27 PM   Modules accepted: Orders

## 2017-11-22 ENCOUNTER — Encounter (HOSPITAL_BASED_OUTPATIENT_CLINIC_OR_DEPARTMENT_OTHER): Payer: Self-pay | Admitting: *Deleted

## 2017-11-24 ENCOUNTER — Other Ambulatory Visit: Payer: Self-pay

## 2017-11-24 ENCOUNTER — Encounter (HOSPITAL_BASED_OUTPATIENT_CLINIC_OR_DEPARTMENT_OTHER): Payer: Self-pay | Admitting: *Deleted

## 2017-11-24 NOTE — Progress Notes (Addendum)
Spoke w/ pt via phone for pre-op interview.  Npo after mn w/ exception clear liquids until 0800.  Arrive at 1200.  Needs istat and urine preg.  Current ekg in chart and epic.  Will take am meds w/ sips of water dos with exception do not take adderell.  Clearance to stop eliquis for surgery received via fax from dr Louis Meckel office and placed w/ chart.

## 2017-11-27 ENCOUNTER — Other Ambulatory Visit: Payer: Self-pay | Admitting: Neurology

## 2017-11-27 MED ORDER — TRAMADOL HCL ER 300 MG PO TB24: 300.0000 mg | ORAL_TABLET | Freq: Every day | ORAL | 0 refills | Status: DC

## 2017-11-27 MED ORDER — AMPHETAMINE-DEXTROAMPHET ER 20 MG PO CP24: 20.0000 mg | ORAL_CAPSULE | Freq: Every day | ORAL | 0 refills | Status: DC

## 2017-11-27 NOTE — Telephone Encounter (Signed)
Pt requesting refills for amphetamine-dextroamphetamine (ADDERALL XR) 20 MG 24 hr capsule and Tramadol 300MG (did not see in chart) sent to [facility] on [address]

## 2017-11-29 ENCOUNTER — Ambulatory Visit (HOSPITAL_BASED_OUTPATIENT_CLINIC_OR_DEPARTMENT_OTHER): Payer: Medicare Other | Admitting: Anesthesiology

## 2017-11-29 ENCOUNTER — Encounter (HOSPITAL_BASED_OUTPATIENT_CLINIC_OR_DEPARTMENT_OTHER)
Admission: RE | Disposition: A | Discharge status: Home / Self Care | Payer: Self-pay | Source: Ambulatory Visit | Attending: Urology

## 2017-11-29 ENCOUNTER — Encounter (HOSPITAL_BASED_OUTPATIENT_CLINIC_OR_DEPARTMENT_OTHER): Payer: Self-pay | Admitting: *Deleted

## 2017-11-29 ENCOUNTER — Ambulatory Visit (HOSPITAL_BASED_OUTPATIENT_CLINIC_OR_DEPARTMENT_OTHER)
Admission: RE | Admit: 2017-11-29 | Discharge: 2017-11-29 | Disposition: A | Discharge status: Home / Self Care | Payer: Medicare Other | Source: Ambulatory Visit | Attending: Urology | Admitting: Urology

## 2017-11-29 ENCOUNTER — Other Ambulatory Visit: Payer: Self-pay

## 2017-11-29 DIAGNOSIS — I10 Essential (primary) hypertension: Secondary | ICD-10-CM | POA: Diagnosis not present

## 2017-11-29 DIAGNOSIS — F419 Anxiety disorder, unspecified: Secondary | ICD-10-CM | POA: Diagnosis not present

## 2017-11-29 DIAGNOSIS — F329 Major depressive disorder, single episode, unspecified: Secondary | ICD-10-CM | POA: Diagnosis not present

## 2017-11-29 DIAGNOSIS — Z79899 Other long term (current) drug therapy: Secondary | ICD-10-CM | POA: Diagnosis not present

## 2017-11-29 DIAGNOSIS — N131 Hydronephrosis with ureteral stricture, not elsewhere classified: Secondary | ICD-10-CM | POA: Diagnosis present

## 2017-11-29 DIAGNOSIS — N1330 Unspecified hydronephrosis: Secondary | ICD-10-CM

## 2017-11-29 DIAGNOSIS — N319 Neuromuscular dysfunction of bladder, unspecified: Secondary | ICD-10-CM | POA: Diagnosis not present

## 2017-11-29 DIAGNOSIS — G35 Multiple sclerosis: Secondary | ICD-10-CM | POA: Diagnosis not present

## 2017-11-29 DIAGNOSIS — K219 Gastro-esophageal reflux disease without esophagitis: Secondary | ICD-10-CM | POA: Diagnosis not present

## 2017-11-29 HISTORY — DX: Personal history of other diseases of the musculoskeletal system and connective tissue: Z87.39

## 2017-11-29 HISTORY — DX: Weakness: R53.1

## 2017-11-29 HISTORY — DX: Unspecified hydronephrosis: N13.30

## 2017-11-29 HISTORY — DX: Gastro-esophageal reflux disease without esophagitis: K21.9

## 2017-11-29 HISTORY — DX: Other chronic pain: G89.29

## 2017-11-29 HISTORY — DX: Acute embolism and thrombosis of unspecified deep veins of unspecified lower extremity: I82.409

## 2017-11-29 HISTORY — DX: Chronic kidney disease, stage 3 (moderate): N18.3

## 2017-11-29 HISTORY — DX: Long term (current) use of anticoagulants: Z79.01

## 2017-11-29 HISTORY — PX: CYSTOSCOPY WITH RETROGRADE PYELOGRAM, URETEROSCOPY AND STENT PLACEMENT: SHX5789

## 2017-11-29 HISTORY — DX: Unspecified abnormalities of gait and mobility: R26.9

## 2017-11-29 HISTORY — DX: Other muscle spasm: M62.838

## 2017-11-29 HISTORY — DX: Other pulmonary embolism without acute cor pulmonale: I26.99

## 2017-11-29 HISTORY — DX: Personal history of other diseases of the nervous system and sense organs: Z86.69

## 2017-11-29 HISTORY — DX: Chronic kidney disease, stage 3 unspecified: N18.30

## 2017-11-29 HISTORY — DX: Personal history of Methicillin resistant Staphylococcus aureus infection: Z86.14

## 2017-11-29 HISTORY — DX: Urgency of urination: R39.15

## 2017-11-29 HISTORY — DX: Personal history of pulmonary embolism: Z86.711

## 2017-11-29 HISTORY — DX: Straining to void: R39.16

## 2017-11-29 HISTORY — DX: Migraine, unspecified, not intractable, without status migrainosus: G43.909

## 2017-11-29 HISTORY — DX: Neuromuscular dysfunction of bladder, unspecified: N31.9

## 2017-11-29 HISTORY — DX: Personal history of other venous thrombosis and embolism: Z86.718

## 2017-11-29 LAB — POCT I-STAT 4, (NA,K, GLUC, HGB,HCT)
Glucose, Bld: 84 mg/dL (ref 70–99)
HEMATOCRIT: 40 % (ref 36.0–46.0)
HEMOGLOBIN: 13.6 g/dL (ref 12.0–15.0)
Potassium: 4.1 mmol/L (ref 3.5–5.1)
SODIUM: 142 mmol/L (ref 135–145)

## 2017-11-29 SURGERY — CYSTOURETEROSCOPY, WITH RETROGRADE PYELOGRAM AND STENT INSERTION
Anesthesia: General | Site: Ureter | Laterality: Bilateral

## 2017-11-29 MED ORDER — DEXAMETHASONE SODIUM PHOSPHATE 4 MG/ML IJ SOLN
INTRAMUSCULAR | Status: DC | PRN
  Administered 2017-11-29: 10 mg via INTRAVENOUS

## 2017-11-29 MED ORDER — PROPOFOL 10 MG/ML IV BOLUS
INTRAVENOUS | Status: AC
  Filled 2017-11-29: qty 20

## 2017-11-29 MED ORDER — OXYCODONE HCL 5 MG/5ML PO SOLN
5.0000 mg | Freq: Once | ORAL | Status: DC | PRN
  Filled 2017-11-29: qty 5

## 2017-11-29 MED ORDER — CIPROFLOXACIN IN D5W 400 MG/200ML IV SOLN
INTRAVENOUS | Status: AC
  Filled 2017-11-29: qty 200

## 2017-11-29 MED ORDER — BELLADONNA ALKALOIDS-OPIUM 16.2-60 MG RE SUPP
RECTAL | Status: DC | PRN
  Administered 2017-11-29: 1 via RECTAL

## 2017-11-29 MED ORDER — MIDAZOLAM HCL 2 MG/2ML IJ SOLN
INTRAMUSCULAR | Status: AC
  Filled 2017-11-29: qty 2

## 2017-11-29 MED ORDER — CEFAZOLIN SODIUM-DEXTROSE 2-4 GM/100ML-% IV SOLN
2.0000 g | INTRAVENOUS | Status: DC
  Filled 2017-11-29: qty 100

## 2017-11-29 MED ORDER — MIDAZOLAM HCL 5 MG/5ML IJ SOLN
INTRAMUSCULAR | Status: DC | PRN
  Administered 2017-11-29: 2 mg via INTRAVENOUS

## 2017-11-29 MED ORDER — PROMETHAZINE HCL 25 MG/ML IJ SOLN
6.2500 mg | INTRAMUSCULAR | Status: DC | PRN
  Filled 2017-11-29: qty 1

## 2017-11-29 MED ORDER — ONDANSETRON HCL 4 MG/2ML IJ SOLN
INTRAMUSCULAR | Status: DC | PRN
  Administered 2017-11-29: 4 mg via INTRAVENOUS

## 2017-11-29 MED ORDER — PHENAZOPYRIDINE HCL 200 MG PO TABS: 200.0000 mg | ORAL_TABLET | Freq: Three times a day (TID) | ORAL | 0 refills | Status: DC | PRN

## 2017-11-29 MED ORDER — FENTANYL CITRATE (PF) 100 MCG/2ML IJ SOLN
25.0000 ug | INTRAMUSCULAR | Status: DC | PRN
  Filled 2017-11-29: qty 1

## 2017-11-29 MED ORDER — LACTATED RINGERS IV SOLN
INTRAVENOUS | Status: DC
  Filled 2017-11-29: qty 1000

## 2017-11-29 MED ORDER — MEPERIDINE HCL 25 MG/ML IJ SOLN
6.2500 mg | INTRAMUSCULAR | Status: DC | PRN
  Filled 2017-11-29: qty 1

## 2017-11-29 MED ORDER — CIPROFLOXACIN IN D5W 400 MG/200ML IV SOLN
400.0000 mg | Freq: Two times a day (BID) | INTRAVENOUS | Status: DC
  Administered 2017-11-29: 400 mg via INTRAVENOUS
  Filled 2017-11-29: qty 200

## 2017-11-29 MED ORDER — CIPROFLOXACIN HCL 500 MG PO TABS: 500.0000 mg | ORAL_TABLET | Freq: Once | ORAL | 0 refills | Status: AC

## 2017-11-29 MED ORDER — LIDOCAINE 2% (20 MG/ML) 5 ML SYRINGE
INTRAMUSCULAR | Status: AC
  Filled 2017-11-29: qty 5

## 2017-11-29 MED ORDER — BELLADONNA ALKALOIDS-OPIUM 16.2-60 MG RE SUPP
RECTAL | Status: AC
  Filled 2017-11-29: qty 1

## 2017-11-29 MED ORDER — SODIUM CHLORIDE 0.9 % IR SOLN
Status: DC | PRN
  Administered 2017-11-29: 3000 mL

## 2017-11-29 MED ORDER — FENTANYL CITRATE (PF) 100 MCG/2ML IJ SOLN
INTRAMUSCULAR | Status: DC | PRN
  Administered 2017-11-29 (×4): 25 ug via INTRAVENOUS

## 2017-11-29 MED ORDER — LACTATED RINGERS IV SOLN
INTRAVENOUS | Status: DC
  Administered 2017-11-29: 14:00:00 via INTRAVENOUS
  Filled 2017-11-29: qty 1000

## 2017-11-29 MED ORDER — PROPOFOL 10 MG/ML IV BOLUS
INTRAVENOUS | Status: DC | PRN
  Administered 2017-11-29: 200 mg via INTRAVENOUS

## 2017-11-29 MED ORDER — IOHEXOL 300 MG/ML  SOLN
INTRAMUSCULAR | Status: DC | PRN
  Administered 2017-11-29: 45 mL

## 2017-11-29 MED ORDER — FENTANYL CITRATE (PF) 100 MCG/2ML IJ SOLN
INTRAMUSCULAR | Status: AC
  Filled 2017-11-29: qty 2

## 2017-11-29 MED ORDER — OXYCODONE HCL 5 MG PO TABS
5.0000 mg | ORAL_TABLET | Freq: Once | ORAL | Status: DC | PRN
  Filled 2017-11-29: qty 1

## 2017-11-29 MED ORDER — LIDOCAINE HCL (CARDIAC) PF 100 MG/5ML IV SOSY
PREFILLED_SYRINGE | INTRAVENOUS | Status: DC | PRN
  Administered 2017-11-29: 60 mg via INTRAVENOUS

## 2017-11-29 MED ORDER — ONDANSETRON HCL 4 MG/2ML IJ SOLN
INTRAMUSCULAR | Status: AC
  Filled 2017-11-29: qty 2

## 2017-11-29 SURGICAL SUPPLY — 29 items
BAG DRAIN URO-CYSTO SKYTR STRL (DRAIN) ×3 IMPLANT
BASKET LASER NITINOL 1.9FR (BASKET) IMPLANT
BASKET STNLS GEMINI 4WIRE 3FR (BASKET) IMPLANT
BASKET ZERO TIP NITINOL 2.4FR (BASKET) IMPLANT
CATH URET 5FR 28IN OPEN ENDED (CATHETERS) ×3 IMPLANT
CATH URET DUAL LUMEN 6-10FR 50 (CATHETERS) IMPLANT
CLOTH BEACON ORANGE TIMEOUT ST (SAFETY) ×3 IMPLANT
EXTRACTOR STONE 1.7FRX115CM (UROLOGICAL SUPPLIES) IMPLANT
FIBER LASER TRAC TIP (UROLOGICAL SUPPLIES) IMPLANT
GLOVE BIO SURGEON STRL SZ7.5 (GLOVE) ×3 IMPLANT
GOWN STRL REUS W/TWL XL LVL3 (GOWN DISPOSABLE) ×3 IMPLANT
GUIDEWIRE 0.038 PTFE COATED (WIRE) IMPLANT
GUIDEWIRE ANG ZIPWIRE 038X150 (WIRE) IMPLANT
GUIDEWIRE STR DUAL SENSOR (WIRE) ×3 IMPLANT
INFUSOR MANOMETER BAG 3000ML (MISCELLANEOUS) IMPLANT
IV NS IRRIG 3000ML ARTHROMATIC (IV SOLUTION) ×3 IMPLANT
KIT BALLIN UROMAX 15FX10 (LABEL) IMPLANT
KIT BALLN UROMAX 15FX4 (MISCELLANEOUS) ×1 IMPLANT
KIT BALLN UROMAX 26 75X4 (MISCELLANEOUS) ×2
KIT TURNOVER CYSTO (KITS) ×3 IMPLANT
MANIFOLD NEPTUNE II (INSTRUMENTS) ×3 IMPLANT
NS IRRIG 500ML POUR BTL (IV SOLUTION) ×3 IMPLANT
PACK CYSTO (CUSTOM PROCEDURE TRAY) ×3 IMPLANT
SET HIGH PRES BAL DIL (LABEL)
SHEATH ACCESS URETERAL 38CM (SHEATH) IMPLANT
STENT URET 6FRX26 CONTOUR (STENTS) ×3 IMPLANT
TUBE CONNECTING 12'X1/4 (SUCTIONS) ×1
TUBE CONNECTING 12X1/4 (SUCTIONS) ×2 IMPLANT
TUBING UROLOGY SET (TUBING) ×3 IMPLANT

## 2017-11-29 NOTE — Interval H&P Note (Signed)
History and Physical Interval Note:  11/29/2017 1:14 PM  [place]  has presented today for surgery, with the diagnosis of left hydronephrosis  The various methods of treatment have been discussed with the patient and family. After consideration of risks, benefits and other options for treatment, the patient has consented to  Procedure(s): BILATERAL  RETROGRADE PYELOGRAM, LEFT DIAGNOSIC URETEROSCOPY AND STENT LEFT PLACEMENT (Bilateral) as a surgical intervention .  The patient's history has been reviewed, patient examined, no change in status, stable for surgery.  I have reviewed the patient's chart and labs.  Questions were answered to the patient's satisfaction.     Louis Meckel W

## 2017-11-29 NOTE — Progress Notes (Signed)
Patient unable to void    Dr Smith Robert OKED  NOT TO DO URINE PREG.   PATIENT IS ON BIRTH CONTROL.Marland Kitchen

## 2017-11-29 NOTE — Discharge Instructions (Signed)
DISCHARGE INSTRUCTIONS FOR KIDNEY STONE/URETERAL STENT   MEDICATIONS:  1.  Resume all your other meds from home - except do not take any extra narcotic pain meds that you may have at home.  2. Pyridium is to help with the burning/stinging when you urinate. 3. Tramadol is for moderate/severe pain, otherwise taking upto 1000 mg every 6 hours of plainTylenol will help treat your pain.   4. Take Cipro one hour prior to removal of your stent.   5. Resume Eliquis 24 hours after surgery or 24 hours after you are no longer seeing blood in your urine.  ACTIVITY:  1. No strenuous activity x 1week  2. No driving while on narcotic pain medications  3. Drink plenty of water  4. Continue to walk at home - you can still get blood clots when you are at home, so keep active, but don't over do it.  5. May return to work/school tomorrow or when you feel ready   BATHING:  1. You can shower and we recommend daily showers  2. You have a string coming from your urethra: The stent string is attached to your ureteral stent. Do not pull on this.   SIGNS/SYMPTOMS TO CALL:  Please call us if you have a fever greater than 101.5, uncontrolled nausea/vomiting, uncontrolled pain, dizziness, unable to urinate, bloody urine, chest pain, shortness of breath, leg swelling, leg pain, redness around wound, drainage from wound, or any other concerns or questions.   You can reach [place] at [phone].   FOLLOW-UP:  1. You have an appointment in 2 weeks. 2. You have a string attached to your stent, you may remove it on Sept 30th (Monday). To do this, pull the strings until the stents are completely removed. You may feel an odd sensation in your back.   Post Anesthesia Home Care Instructions  Activity: Get plenty of rest for the remainder of the day. A responsible individual must stay with you for 24 hours following the procedure.  For the next 24 hours, DO NOT: -Drive a car -Paediatric nurse -Drink alcoholic  beverages -Take any medication unless instructed by your physician -Make any legal decisions or sign important papers.  Meals: Start with liquid foods such as gelatin or soup. Progress to regular foods as tolerated. Avoid greasy, spicy, heavy foods. If nausea and/or vomiting occur, drink only clear liquids until the nausea and/or vomiting subsides. Call your physician if vomiting continues.  Special Instructions/Symptoms: Your throat may feel dry or sore from the anesthesia or the breathing tube placed in your throat during surgery. If this causes discomfort, gargle with warm salt water. The discomfort should disappear within 24 hours.

## 2017-11-29 NOTE — Transfer of Care (Signed)
Immediate Anesthesia Transfer of Care Note  Patient: Dawn Peters  Procedure(s) Performed: Procedure(s) (LRB): BILATERAL  RETROGRADE PYELOGRAM, LEFT DIAGNOSIC URETEROSCOPY AND STENT LEFT PLACEMENT (Bilateral)  Patient Location: PACU  Anesthesia Type: General  Level of Consciousness: awake, sedated, patient cooperative and responds to stimulation  Airway & Oxygen Therapy: Patient Spontanous Breathing and Patient connected to [facility] O2  Post-op Assessment: Report given to PACU RN, Post -op Vital signs reviewed and stable and Patient moving all extremities  Post vital signs: Reviewed and stable  Complications: No apparent anesthesia complications

## 2017-11-29 NOTE — H&P (Signed)
CC: hydronephrosis.  HPI: Dawn Peters is a 37 year-old female established patient who is here for hydronephrosis.  The problem is on the left side. She is not having pain. She is not currently having flank pain, back pain, groin pain, nausea, vomiting, fever or chills.   The patient has had recent labs.   She has not had previous abdominal surgery. She has not had a stent placed in her kidney. She has not received radiation therapy. The patient does not have a history of recurrent UTIs.   Renogram 5/19:  Left kidney: 11%, T1/2 - not reached post-Lasix  Right kidney: 89%, T1/2- 9.7 minutes interval: The patient presents today for discussion of her Lasix radiogram.   Patient denies any pain today or any significant progression of her lower urinary tract symptoms. She denies any hematuria. She has a feeling of chills. There is no reflux noted on today's urodynamic study.       CC: Neurogenic bladder.  HPI: The event occurred 02/21/2016. Her neurogenic bladder was caused by Multiple Sclerosis.   She does have to strain or bear down to start her urinary stream. She does not have an abnormal sensation when she needs to urinate. She is having problems with urinary control or incontinence. She does not have recurrent infections.   Her neurogenic bladder has been treated with alpha blockers.   Her last radiologic test to evaluate the kidneys was 08/21/2016.   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 She has not had any significant progression or worsening of her symptoms. A longer present. Has had hydro since at least April 2017, stable in appearance on MRI in June 2018. Last renal function checked in June 2018: BUN 12/Cr 1.32.    The patient takes tamsulosin to help with her bladder outlet.  UDS:  8/19: Patient has a small bladder capacity with instability. She has no notable incontinence. She does have difficulty emptying her bladder completely and has a weak  stream. She has discoordinated voiding with pelvic floor (DSD). Bladder pressure was acceptable, there was no ureteral reflux, and there was mild trabeculation.   The patient is able to void on her own, but often has to strain to do so. We've prescribed for her tamsulosin in the past, but she has not been taking it. The patient denies any incontinence.     ALLERGIES: None   MEDICATIONS: Lisinopril 20 mg tablet  Metoprolol Tartrate 25 mg tablet  Tamsulosin Hcl 0.4 mg capsule  Adderall Xr 20 mg capsule, ext release 24 hr  Amantadine 100 mg capsule  Amitriptyline Hcl 25 mg tablet  Amlodipine Besylate 5 mg tablet  Baclofen 20 mg tablet  Biofreeze 4 % gel  Buspirone Hcl 15 mg tablet  Citalopram Hbr 40 mg tablet  Dalfampridine Er 10 mg tablet, extended release 12 hr  Lamotrigine 200 mg tablet  Lyrica 200 mg capsule  Neurontin 100 mg capsule  Oxcarbazepine 300 mg tablet  Oxycodone-Acetaminophen 10 mg-325 mg tablet  Sumatriptan Succinate 100 mg tablet  Tizanidine Hcl 4 mg tablet  Valacyclovir 500 mg tablet     GU PSH: Complex cystometrogram, w/ void pressure and urethral pressure profile studies, any technique - 10/23/2017 Complex Uroflow - 10/23/2017 Emg surf Electrd - 10/23/2017 Inject For cystogram - 10/23/2017 Intrabd voidng Press - 10/23/2017    NON-GU PSH: None   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   Tobacco Use Assessment Completed: Used Tobacco in last 30 days?    REVIEW OF SYSTEMS:    GU Review Female:   Patient denies frequent urination, hard to postpone urination, burning /pain with urination, get up at night to urinate, leakage of urine, stream starts and stops, trouble starting  your stream, have to strain to urinate, and being pregnant.  Gastrointestinal (Upper):   Patient denies nausea, vomiting, and indigestion/ heartburn.  Gastrointestinal (Lower):   Patient reports constipation. Patient denies diarrhea.  Constitutional:   Patient denies fever, night sweats, weight loss, and fatigue.  Skin:   Patient denies skin rash/ lesion and itching.  Eyes:   Patient denies blurred vision and double vision.  Ears/ Nose/ Throat:   Patient denies sore throat and sinus problems.  Hematologic/Lymphatic:   Patient denies swollen glands and easy bruising.  Cardiovascular:   Patient denies leg swelling and chest pains.  Respiratory:   Patient denies cough and shortness of breath.  Endocrine:   Patient denies excessive thirst.  Musculoskeletal:   Patient denies back pain and joint pain.  Neurological:   Patient denies headaches and dizziness.  Psychologic:   Patient denies depression and anxiety.   VITAL SIGNS:      11/03/2017 02:28 PM  Weight 208 lb / 94.35 kg  Height 65 in / 165.1 cm  BP 125/84 mmHg  Pulse 128 /min  BMI 34.6 kg/m   MULTI-SYSTEM PHYSICAL EXAMINATION:    Constitutional: Well-nourished. No physical deformities. Normally developed. Good grooming.  Respiratory: No labored breathing, no use of accessory muscles.   Cardiovascular: Normal temperature, normal extremity pulses, no swelling, no varicosities.     PAST DATA REVIEWED:  Source Of History:  Patient  Records Review:   Previous Doctor Records, Previous Patient Records, POC Tool  Urodynamics Review:   Review Urodynamics Tests   PROCEDURES:         PVR Ultrasound - 77414  Scanned Volume: 0 cc   ASSESSMENT:      ICD-10 Details  1 GU:   Bladder, Neuromuscular dysfunction, Unspec - N31.9   2   Ureteral obstruction - N13.1    PLAN:           Document Letter(s):  Created for Patient: Clinical Summary         Notes:   The patient's bladder pressures are stable. She does have uninhibited bladder  contractions and her activity. She doesn't do a great job of emptying her bladder, but this seems to be helped by the tamsulosin. I offered to give the patient Myrbetriq as a trial for overactive symptoms as he undergoes.   The patient has left-sided hydronephrosis, there is no reflux noted on urodynamics. I recommended that we proceed to the operating room to see if there is anything of clinical significance or there is a way that we can obstruct the kidney. Unfortunately, the kidney itself only has 11% function.   Our plan is to proceed to the operating room for retrograde pyelogram and diagnostic ureteroscopy.

## 2017-11-29 NOTE — Anesthesia Procedure Notes (Signed)
Procedure Name: LMA Insertion Date/Time: 11/29/2017 2:01 PM Performed by: Justice Rocher, CRNA Pre-anesthesia Checklist: Patient identified, Emergency Drugs available, Suction available and Patient being monitored Patient Re-evaluated:Patient Re-evaluated prior to induction Oxygen Delivery Method: Circle system utilized Preoxygenation: Pre-oxygenation with 100% oxygen Induction Type: IV induction Ventilation: Mask ventilation without difficulty LMA: LMA inserted LMA Size: 4.0 Number of attempts: 1 Airway Equipment and Method: Bite block Placement Confirmation: positive ETCO2 and breath sounds checked- equal and bilateral Tube secured with: Tape Dental Injury: Teeth and Oropharynx as per pre-operative assessment

## 2017-11-29 NOTE — Anesthesia Preprocedure Evaluation (Addendum)
Anesthesia Evaluation  Patient identified by MRN, date of birth, ID band Patient awake    Reviewed: Allergy & Precautions, NPO status , Patient's Chart, lab work & pertinent test results  Airway Mallampati: II  TM Distance: >3 FB Neck ROM: Full    Dental  (+) Teeth Intact, Dental Advisory Given   Pulmonary neg pulmonary ROS,    breath sounds clear to auscultation       Cardiovascular hypertension, Pt. on medications and Pt. on home beta blockers + Peripheral Vascular Disease   Rhythm:Regular Rate:Normal     Neuro/Psych  Headaches, Anxiety Depression    GI/Hepatic Neg liver ROS, GERD  ,  Endo/Other    Renal/GU CRFRenal disease     Musculoskeletal negative musculoskeletal ROS (+)   Abdominal (+) + obese,   Peds  Hematology negative hematology ROS (+)   Anesthesia Other Findings - MS  Reproductive/Obstetrics                            Lab Results  Component Value Date   WBC 6.2 09/19/2017   HGB 13.1 09/19/2017   HCT 38.7 09/19/2017   MCV 97.2 09/19/2017   PLT 260 09/19/2017   Lab Results  Component Value Date   CREATININE 1.38 (H) 09/19/2017   BUN 13 09/19/2017   NA 141 09/19/2017   K 4.2 09/19/2017   CL 106 09/19/2017   CO2 26 09/19/2017   Lab Results  Component Value Date   INR 1.08 09/18/2017   INR 0.95 04/20/2017   Echo: - Left ventricle: The cavity size was normal. Wall thickness was   normal. Systolic function was normal. The estimated ejection   fraction was in the range of 55% to 60%. Wall motion was normal;   there were no regional wall motion abnormalities. Left   ventricular diastolic function parameters were normal. - Mitral valve: There was mild regurgitation.  EKG: sinus tachycardia.  Anesthesia Physical Anesthesia Plan  ASA: III  Anesthesia Plan: General   Post-op Pain Management:    Induction: Intravenous  PONV Risk Score and Plan: 4 or greater  and Ondansetron, Dexamethasone, Midazolam and Scopolamine patch - Pre-op  Airway Management Planned: LMA  Additional Equipment: None  Intra-op Plan:   Post-operative Plan: Extubation in OR  Informed Consent: I have reviewed the patients History and Physical, chart, labs and discussed the procedure including the risks, benefits and alternatives for the proposed anesthesia with the patient or authorized representative who has indicated his/her understanding and acceptance.   Dental advisory given  Plan Discussed with: CRNA  Anesthesia Plan Comments: (Last MS exacerbation approx. 4 months ago (L sided weakness), full resolution. )       Anesthesia Quick Evaluation

## 2017-11-29 NOTE — Interval H&P Note (Signed)
History and Physical Interval Note:  11/29/2017 1:14 PM  Gray  has presented today for surgery, with the diagnosis of left hydronephrosis  The various methods of treatment have been discussed with the patient and family. After consideration of risks, benefits and other options for treatment, the patient has consented to  Procedure(s): BILATERAL  RETROGRADE PYELOGRAM, LEFT DIAGNOSIC URETEROSCOPY AND STENT LEFT PLACEMENT (Bilateral) as a surgical intervention .  The patient's history has been reviewed, patient examined, no change in status, stable for surgery.  I have reviewed the patient's chart and labs.  Questions were answered to the patient's satisfaction.     Louis Meckel W

## 2017-11-29 NOTE — Op Note (Signed)
Preoperative diagnosis:  1. Left hydronephrosis   Postoperative diagnosis:  1. same   Procedure:  1. Cystoscopy 2. left diagnostic ureteroscopy, left ureteral biopsy, left ureteral balloon dilation, left ureteral stent placement 3. bilateral retrograde pyelography with interpretation   Surgeon: Ardis Hughs, MD  Anesthesia: General  Complications: None  Intraoperative findings: #1 - cystoscopy was unremarkable.  No bladder abnormalities #2 - right retrograde pyelogram was normal - ureter of normal caliber, no filling defects, sharp calyces, no hydro. #3 - left ureter had an abrupt transition right at the pelvic brim with severely dilated proximal ureter and severe tortuosity. There was no filling defect. #4 - left ureteroscopy demonstrated no intrinsic mass, severe narrowing of ureter that was abrupt, area was biopsied, urine cytology sent from that area.  EBL: Minimal  Specimens: left ureteral biopsy, left ureteral washing for cytology  Indication: Dawn Peters is a 37 y.o. patient with severe left sided hydronephrosis that was found incidentally.  Work-up has been largely unremarkable, but to date the ureter had not been visually inspected for intrinsic abnormality. After reviewing the management options for treatment, he elected to proceed with the above surgical procedure(s). We have discussed the potential benefits and risks of the procedure, side effects of the proposed treatment, the likelihood of the patient achieving the goals of the procedure, and any potential problems that might occur during the procedure or recuperation. Informed consent has been obtained.  Description of procedure:  The patient was taken to the operating room and general anesthesia was induced.  The patient was placed in the dorsal lithotomy position, prepped and draped in the usual sterile fashion, and preoperative antibiotics were administered. A preoperative time-out was performed.    Cystourethroscopy was performed.  The patient's urethra was examined and was normal. The bladder was then systematically examined in its entirety. There was no evidence for any bladder tumors, stones, or other mucosal pathology.    Attention then turned to the rightureteral orifice and a ureteral catheter was used to intubate the ureteral orifice.  Omnipaque contrast was injected through the ureteral catheter and a retrograde pyelogram was performed with findings as dictated above.   Attention then turned to the leftureteral orifice and a ureteral catheter was used to intubate the ureteral orifice.  Omnipaque contrast was injected through the ureteral catheter and a retrograde pyelogram was performed with findings as dictated above.   A 0.38 sensor guidewire was then advanced up the left ureter into the renal pelvis under fluoroscopic guidance.  I then advanced a semi-rigid ureteroscopy into the left ureter and advanced it to the narrowed segment.  I was able to get passed the area with gentle pressure.  Proximal to the stricture  The ureter was severely dilated but there were no other obvious abnormalities.  I pulled back to the narrowed segment and performed a ureteral wall biopsy.  I then performed a urine cytology within the same area.  I then backed out the scope.  The wire was then backloaded through the cystoscope and a ureteral stent was advance over the wire using Seldinger technique.  The stent was positioned appropriately under fluoroscopic and cystoscopic guidance.  The wire was then removed with an adequate stent curl noted in the renal pelvis as well as in the bladder.  The bladder was then emptied and the procedure ended.  The patient appeared to tolerate the procedure well and without complications.  The patient was able to be awakened and transferred to the recovery unit  in satisfactory condition.   The stent tether was left on and tucked inside the patient's vagina.  It should be  removed Monday, Sept 30th.   Ardis Hughs, M.D.

## 2017-11-30 ENCOUNTER — Encounter (HOSPITAL_BASED_OUTPATIENT_CLINIC_OR_DEPARTMENT_OTHER): Payer: Self-pay | Admitting: Urology

## 2017-11-30 NOTE — Anesthesia Postprocedure Evaluation (Signed)
Anesthesia Post Note  Patient: Dawn Peters  Procedure(s) Performed: BILATERAL  RETROGRADE PYELOGRAM, LEFT DIAGNOSIC URETEROSCOPY AND STENT LEFT PLACEMENT (Bilateral Ureter)     Anesthesia Type: General    Last Vitals:  Vitals:   11/29/17 1600 11/29/17 1730  BP: 122/82 (!) 147/96  Pulse: 78 76  Resp: 17 16  Temp:  36.4 C  SpO2: 96% 98%    Last Pain:  Vitals:   11/30/17 1114  TempSrc:   PainSc: 2                  Effie Berkshire

## 2017-12-02 ENCOUNTER — Other Ambulatory Visit: Payer: Self-pay | Admitting: Neurology

## 2017-12-04 ENCOUNTER — Other Ambulatory Visit: Payer: Self-pay | Admitting: Neurology

## 2017-12-26 ENCOUNTER — Other Ambulatory Visit: Payer: Self-pay | Admitting: Neurology

## 2018-01-02 ENCOUNTER — Other Ambulatory Visit: Payer: Self-pay | Admitting: Neurology

## 2018-01-02 MED ORDER — AMPHETAMINE-DEXTROAMPHET ER 20 MG PO CP24: 20.0000 mg | ORAL_CAPSULE | Freq: Every day | ORAL | 0 refills | Status: DC

## 2018-01-02 NOTE — Addendum Note (Signed)
Addended by: Hope Pigeon on: 01/02/2018 03:24 PM   Modules accepted: Orders

## 2018-01-02 NOTE — Telephone Encounter (Signed)
Pt has called for her refill on her amphetamine-dextroamphetamine (ADDERALL XR) 20 MG 24 hr capsule  [facility] [place], [place] AT [address] [phone] (Phone) [phone] (Fax)

## 2018-01-09 ENCOUNTER — Telehealth: Payer: Self-pay | Admitting: Neurology

## 2018-01-09 ENCOUNTER — Other Ambulatory Visit: Payer: Self-pay | Admitting: Neurology

## 2018-01-09 DIAGNOSIS — G35 Multiple sclerosis: Secondary | ICD-10-CM

## 2018-01-09 DIAGNOSIS — D649 Anemia, unspecified: Secondary | ICD-10-CM

## 2018-01-09 NOTE — Progress Notes (Signed)
x

## 2018-01-09 NOTE — Telephone Encounter (Signed)
He received the lab work from the [facility].  Her hemoglobin is 9.2 and was 11.41 one month ago.     I called her.  She does not note any symptoms.  However, because there is a small risk of autoimmune hemolytic anemia we will check additional labs including a repeat CBC, LDH, reticulocyte count, haptoglobulin.  She will come in tomorrow to get the lab work.  She is advised to seek quicker medical attention if there are worsening symptoms

## 2018-01-11 ENCOUNTER — Other Ambulatory Visit (INDEPENDENT_AMBULATORY_CARE_PROVIDER_SITE_OTHER): Payer: Self-pay

## 2018-01-11 DIAGNOSIS — G35 Multiple sclerosis: Secondary | ICD-10-CM

## 2018-01-11 DIAGNOSIS — Z0289 Encounter for other administrative examinations: Secondary | ICD-10-CM

## 2018-01-11 DIAGNOSIS — D649 Anemia, unspecified: Secondary | ICD-10-CM

## 2018-01-12 ENCOUNTER — Telehealth: Payer: Self-pay | Admitting: *Deleted

## 2018-01-12 LAB — CBC WITH DIFFERENTIAL/PLATELET
Basophils Absolute: 0 10*3/uL (ref 0.0–0.2)
Basos: 0 %
EOS (ABSOLUTE): 0.1 10*3/uL (ref 0.0–0.4)
EOS: 1 %
HEMATOCRIT: 30.5 % — AB (ref 34.0–46.6)
HEMOGLOBIN: 10.3 g/dL — AB (ref 11.1–15.9)
IMMATURE GRANULOCYTES: 1 %
Immature Grans (Abs): 0 10*3/uL (ref 0.0–0.1)
LYMPHS ABS: 0.9 10*3/uL (ref 0.7–3.1)
LYMPHS: 16 %
MCH: 32.2 pg (ref 26.6–33.0)
MCHC: 33.8 g/dL (ref 31.5–35.7)
MCV: 95 fL (ref 79–97)
MONOCYTES: 10 %
Monocytes Absolute: 0.6 10*3/uL (ref 0.1–0.9)
NEUTROS PCT: 72 %
Neutrophils Absolute: 4.4 10*3/uL (ref 1.4–7.0)
Platelets: 480 10*3/uL — ABNORMAL HIGH (ref 150–450)
RBC: 3.2 x10E6/uL — AB (ref 3.77–5.28)
RDW: 15.7 % — ABNORMAL HIGH (ref 12.3–15.4)
WBC: 6.1 10*3/uL (ref 3.4–10.8)

## 2018-01-12 LAB — HEPATIC FUNCTION PANEL
ALT: 7 IU/L (ref 0–32)
AST: 12 IU/L (ref 0–40)
Albumin: 4.2 g/dL (ref 3.5–5.5)
Alkaline Phosphatase: 112 IU/L (ref 39–117)
BILIRUBIN TOTAL: 0.5 mg/dL (ref 0.0–1.2)
Bilirubin, Direct: 0.13 mg/dL (ref 0.00–0.40)
Total Protein: 7.5 g/dL (ref 6.0–8.5)

## 2018-01-12 LAB — HAPTOGLOBIN: Haptoglobin: 195 mg/dL (ref 34–200)

## 2018-01-12 LAB — RETICULOCYTES: Retic Ct Pct: 1.4 % (ref 0.6–2.6)

## 2018-01-12 LAB — LACTATE DEHYDROGENASE: LDH: 272 IU/L — AB (ref 119–226)

## 2018-01-12 NOTE — Telephone Encounter (Signed)
Tried calling pt about results. Went straight to automated message stating pt not accepting calls at this time.

## 2018-01-12 NOTE — Telephone Encounter (Signed)
-----   Message from Britt Bottom, MD sent at 01/12/2018 12:13 PM EST ----- The repeat blood work confirmed that she has a mild anemia has worsened over the last month.    She does not appear to be having a hemolytic anemia which is a very rare complication of being on [place].   She should take over-the-counter iron daily.  If it any worse next month, I would want her to discuss further with her primary care doctor

## 2018-01-15 NOTE — Telephone Encounter (Signed)
Called patient. Went over results per Dr. Felecia Shelling note. Recommened per Dr. Felecia Shelling that she start OTC iron supplement. 2 per day. She should f/u with PCP at [facility] center off [address] in the future to have them monitor this. She verbalized understanding and appreciation.

## 2018-01-15 NOTE — Telephone Encounter (Signed)
Pt has returned call to RN Terrence Dupont, she is asking for a call back

## 2018-01-15 NOTE — Telephone Encounter (Signed)
Faxed copy of results to PCP at [phone]. Received fax confirmation

## 2018-01-18 ENCOUNTER — Other Ambulatory Visit: Payer: Self-pay | Admitting: Neurology

## 2018-01-18 MED ORDER — AMPHETAMINE-DEXTROAMPHET ER 20 MG PO CP24: 20.0000 mg | ORAL_CAPSULE | Freq: Every day | ORAL | 0 refills | Status: DC

## 2018-01-18 NOTE — Telephone Encounter (Signed)
Checked drug registry. She last refilled adderall 01/06/18 #30 and tramadol 11/27/17 #30. Last seen 10/19/17 and next f/u 03/28/18

## 2018-01-18 NOTE — Telephone Encounter (Signed)
I called and spoke w/ Dawn Peters. Advised we see she recently refilled oxycodone yesterday for #120. She stated she follows pain specialist who prescribes this. They recommended she take both oxycodone and tramadol. Advised she should f/u with them to prescribe to manage her pain medications. She verbalized understanding, in agreement with plan.  Advised Dr. Felecia Shelling out of office until tomorrow. Will send refill request for adderall to him.

## 2018-01-18 NOTE — Telephone Encounter (Signed)
Patient called in needing a refill on Adderall and Tramado.

## 2018-01-22 ENCOUNTER — Telehealth: Payer: Self-pay | Admitting: Neurology

## 2018-01-22 NOTE — Telephone Encounter (Signed)
I called and LVM for pt relaying below information. Gave [facility] phone number if she has further questions/concerns.

## 2018-01-22 NOTE — Telephone Encounter (Signed)
I called pt back. I spoke with her on 01/18/18 anad advised her to f/u with pain specialist about taking over prescribing this since Melissa Bujonowski is prescribing her oxycodone. Pt stated she said that Dr. Felecia Shelling should prescribe tramadol since he was the one originally prescribing this. Stated Dr. Lisette Grinder placed note her in her chart that it was ok for her to take. Advised per pain contract, she can only receive controlled substance from one physician. I will speak with Dr. Felecia Shelling to see how he would like to move forward. I will call her back. She verbalized understanding.

## 2018-01-22 NOTE — Telephone Encounter (Signed)
Pt requesting refills for   traMADol (ULTRAM-ER) 300 MG 24 hr tablet  Sent to [facility]

## 2018-01-22 NOTE — Telephone Encounter (Signed)
Spoke with Dr. Felecia Shelling- he would like her to get tramadol from pain specialist as well since she is getting other controlled substances from them. We do not prescribe long term opioids.

## 2018-01-25 ENCOUNTER — Other Ambulatory Visit: Payer: Self-pay | Admitting: Neurology

## 2018-01-29 ENCOUNTER — Telehealth: Payer: Self-pay | Admitting: *Deleted

## 2018-01-29 NOTE — Telephone Encounter (Signed)
Called pt. Advised we received monthly lab results from [place]. Her hemoglobin is low and she needs to f/u with PCP on this. She verbalized understanding. I faxed copy of results to PCP at [phone]. Received fax confirmation.

## 2018-02-19 ENCOUNTER — Telehealth: Payer: Self-pay | Admitting: Neurology

## 2018-02-19 NOTE — Telephone Encounter (Signed)
Pt is asking for a call to  Be scheduled for an infusion

## 2018-02-19 NOTE — Telephone Encounter (Signed)
Spoke with Dr. Felecia Shelling. He is okay with pt coming in for one day of IV solumedrol 1000mg  once.

## 2018-02-19 NOTE — Telephone Encounter (Signed)
I called patient back. She states her legs are bothering her. This is not new. She states steroid treatment is the only thing that helps. She can barely walk, off balance and having weakness bilaterally. She is requesting to come in for IV steroids. Advised I will speak with Dr. Felecia Shelling. If ok with him I will give orders to [facility] to call her tomorrow to schedule appt. If not okay, I will call back. She verbalized understanding.

## 2018-02-19 NOTE — Telephone Encounter (Signed)
Gave completed/signed orders to [facility]. Placed on computer keyboard for them to call pt to schedule infusion.

## 2018-02-21 ENCOUNTER — Telehealth: Payer: Self-pay | Admitting: Neurology

## 2018-02-21 NOTE — Telephone Encounter (Signed)
Transferred pt to infusions  °

## 2018-02-21 NOTE — Telephone Encounter (Signed)
Pt asked to be connected to the infusion suite

## 2018-02-24 ENCOUNTER — Other Ambulatory Visit: Payer: Self-pay | Admitting: Neurology

## 2018-02-26 ENCOUNTER — Telehealth: Payer: Self-pay | Admitting: Neurology

## 2018-02-26 NOTE — Telephone Encounter (Signed)
I called pt back. Patient saw PCP Brigitte Pulse at [facility]) this past Saturday. Checked kidney function while she was there. Came back abnormal. I asked pt to call them to have a copy faxed to our office at [phone] for [place] to review. Pt verbalized understanding and will have copy sent to [place].

## 2018-02-26 NOTE — Telephone Encounter (Signed)
Pt called stating her lab result will not be in until Thursday- will have them faxed over then. FYI

## 2018-02-26 NOTE — Telephone Encounter (Signed)
Patient saw her PCP this past Saturday and he did lab. She states her kidney levell is 67 which she needs to discuss.

## 2018-02-26 NOTE — Telephone Encounter (Signed)
Noted  

## 2018-03-01 ENCOUNTER — Telehealth: Payer: Self-pay | Admitting: Neurology

## 2018-03-01 NOTE — Telephone Encounter (Signed)
Pt has called for the [facility] suite, call transferred

## 2018-03-02 ENCOUNTER — Ambulatory Visit: Payer: Medicare Other | Admitting: Physical Therapy

## 2018-03-02 ENCOUNTER — Ambulatory Visit: Payer: Medicare Other

## 2018-03-05 ENCOUNTER — Ambulatory Visit
Admission: RE | Admit: 2018-03-05 | Discharge: 2018-03-05 | Disposition: A | Discharge status: Home / Self Care | Payer: Medicare Other | Source: Ambulatory Visit | Attending: Physician Assistant | Admitting: Physician Assistant

## 2018-03-05 DIAGNOSIS — Z1231 Encounter for screening mammogram for malignant neoplasm of breast: Secondary | ICD-10-CM

## 2018-03-08 ENCOUNTER — Ambulatory Visit: Payer: Medicare Other | Attending: Physician Assistant | Admitting: Physical Therapy

## 2018-03-08 ENCOUNTER — Other Ambulatory Visit: Payer: Self-pay

## 2018-03-08 ENCOUNTER — Ambulatory Visit: Payer: Medicare Other | Admitting: Physical Therapy

## 2018-03-08 DIAGNOSIS — M545 Low back pain: Secondary | ICD-10-CM | POA: Insufficient documentation

## 2018-03-08 DIAGNOSIS — G8929 Other chronic pain: Secondary | ICD-10-CM | POA: Diagnosis present

## 2018-03-08 DIAGNOSIS — M62830 Muscle spasm of back: Secondary | ICD-10-CM | POA: Diagnosis present

## 2018-03-08 NOTE — Therapy (Addendum)
[place], [place], [address] Phone: [phone]   Fax:  [phone]  Physical Therapy Evaluation  Patient Details  Name: Dawn Peters MRN: [id number] Date of Birth: 01/04/1981 Referring Provider (PT): Rulon Abide [place]    Encounter Date: 03/08/2018   Progress Note Reporting Period 03/08/2017 to 05/03/2017 See note below for Objective Data and Assessment of Progress/Goals.       PT End of Session - 03/08/18 1355    Visit Number  1    Number of Visits  16    Date for PT Re-Evaluation  05/03/18    Authorization Type  [employer] MCR complete     PT Start Time  1330    PT Stop Time  1415    PT Time Calculation (min)  45 min    Activity Tolerance  Patient tolerated treatment well    Behavior During Therapy  WFL for tasks assessed/performed       Past Medical History:  Diagnosis Date  . Anticoagulant long-term use    Eliquis  . Anxiety   . Chronic pain   . CKD (chronic kidney disease), stage III ([place])   . Depression   . DVT, lower extremity, recurrent ([place]) 09/17/2017   left lower extremity-- treated with eliquis  . Gait disturbance    due to MS  . GERD (gastroesophageal reflux disease)    watch diet  . 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 History of DVT of lower extremity 2007  . History of encephalopathy 33/3545   acute metabolic encephalopathy secondary to MS,  resolved  . History of MRSA infection 2004   right hip infection post THA  . History of pulmonary embolus (PE) 2005  . Hydronephrosis, left    w/ acute kidney injury 02/ 2019  . Hypertension   . Left-sided weakness    due to MS  . Migraines   . MS (multiple sclerosis) [facility]) neurologist-  dr Renne Musca   dx 2000--  . Muscle spasticity   . Neurogenic bladder    due to MS  . Pulmonary embolism, bilateral ([place]) 09/17/2017   treated w/ eliquis  . Strains  to urinate   . Urgency of urination     Past Surgical History:  Procedure Laterality Date  . CYSTOSCOPY WITH RETROGRADE PYELOGRAM, URETEROSCOPY AND STENT PLACEMENT Bilateral 11/29/2017   Procedure: BILATERAL  RETROGRADE PYELOGRAM, LEFT DIAGNOSIC URETEROSCOPY AND STENT LEFT PLACEMENT;  Surgeon: Ardis Hughs, MD;  Location: [facility];  Service: Urology;  Laterality: Bilateral;  . HIP ARTHROSCOPY Left 07-05-2013   dr pill @[facility]    iliopsosas release, synovectomy  . REVISION TOTAL HIP ARTHROPLASTY Right 03-28-2003    dr Alvan Dame @[facility]   . TOTAL HIP ARTHROPLASTY Bilateral left 11-05-2002  dr Alvan Dame @[facility] ;   right 06/ 2004  @[facility]     There were no vitals filed for this visit.   Subjective Assessment - 03/08/18 1339    Subjective  Patient has had lower and Mid back pain for several years. She feels like the pain is getting worse. She is getting to the point were she can not stand.  She has difficulty bending over and picking things up off the ground. She has difficulty even tieig her shoes.     Limitations  Standing;Walking    How long can you sit comfortably?  Her back sitffnes when she sits for too long  How long can you stand comfortably?  < 20 min     How long can you walk comfortably?  limited community abulation     Patient Stated Goals  less pain in her back    Currently in Pain?  Yes    Pain Score  8     Pain Location  Back    Pain Orientation  Right;Left;Mid    Pain Descriptors / Indicators  Aching    Pain Type  Chronic pain    Pain Onset  More than a month ago    Pain Frequency  Constant    Aggravating Factors   standing, walking, sitting, bending    Pain Relieving Factors  massage     Effect of Pain on Daily Activities  difficulty standing and ewalking          [facility] PT Assessment - 03/08/18 0001      Assessment   Medical Diagnosis  Low Back Pain/ Mid thoracic pain    Referring Provider (PT)  Mellisa Bujanowski PA     Onset Date/Surgical Date  --    years per patient    Hand Dominance  Right    Next MD Visit  tomorrow     Prior Therapy  Yes several years ago       Precautions   Precautions  None      Restrictions   Weight Bearing Restrictions  No      Balance Screen   Has the patient fallen in the past 6 months  Yes    How many times?  3    legs became weak    Has the patient had a decrease in activity level because of a fear of falling?   No    Is the patient reluctant to leave their home because of a fear of falling?   No      Home Film/video editor residence    Additional Comments  single level home with no steps       Prior Function   Level of Independence  Independent    Vocation  On disability    Leisure  walking       Cognition   Overall Cognitive Status  Within Functional Limits for tasks assessed    Attention  Focused    Focused Attention  Appears intact    Memory  Appears intact    Awareness  Appears intact    Problem Solving  Appears intact      Observation/Other Assessments   Observations  frequently rubs her lower back       Sensation   Light Touch  Appears Intact      Coordination   Gross Motor Movements are Fluid and Coordinated  Yes    Fine Motor Movements are Fluid and Coordinated  Yes      ROM / Strength   AROM / PROM / Strength  AROM;PROM;Strength      AROM   AROM Assessment Site  Lumbar    Lumbar Flexion  limited 25% with pain     Lumbar Extension  limited 50%       PROM   PROM Assessment Site  Hip      Strength   Strength Assessment Site  Hip;Knee    Right/Left Hip  Right;Left    Right Hip Flexion  4+/5    Right Hip ABduction  4+/5    Left Hip Flexion  3/5    Left Hip ABduction  3+/5      Flexibility   Soft Tissue Assessment /Muscle Length  yes    Hamstrings  90/90 44 left 38 right       Palpation   Palpation comment  Apsmining into upper traps bilateral; significantt spasming of the lumbar paraspinalls and into the upper gluteals.       Special  Tests   Other special tests  SLR (-) bilateral       Transfers   Comments  had a lossof balance upon initial stanfing      Ambulation/Gait   Gait Comments  decresed left hip flexion; decreased left singgle leg stance. Significant lateral movement with gait.      High Level Balance   High Level Balance Comments  narrow base of support: min a to remain balanced; tandem stance moderate assist to remain balanced                 Objective measurements completed on examination: See above findings.      [place] [facility] Adult PT Treatment/Exercise - 03/08/18 0001      Exercises   Exercises  Lumbar      Lumbar Exercises: Stretches   Active Hamstring Stretch Limitations  reviewed seated and 90/90     Other Lumbar Stretch Exercise  lumbar rotation x10       Lumbar Exercises: Supine   Pelvic Tilt Limitations  x10 with min cuing              PT Education - 03/08/18 1354    Education Details  reviewed HEP and symptom management     Person(s) Educated  Patient    Methods  Explanation;Demonstration;Verbal cues;Tactile cues    Comprehension  Verbalized understanding;Returned demonstration;Verbal cues required;Tactile cues required       PT Short Term Goals - 03/08/18 1355      PT SHORT TERM GOAL #1   Title  Patient will increase lumbar flexion by 25%     Time  4    Period  Weeks    Status  New    Target Date  04/05/18      PT SHORT TERM GOAL #2   Title  Patient will increase gross bilateral lower extremity strength to 4+/5     Time  4    Period  Weeks    Status  New    Target Date  04/05/18      PT SHORT TERM GOAL #3   Title  Patient will be independent with initial HEP     Time  4    Period  Weeks    Status  New    Target Date  04/05/18        PT Long Term Goals - 03/08/18 1450      PT LONG TERM GOAL #1   Title  Patient will stand for 45 minutes without pain in order to perfrom ADL's     Time  8    Period  Weeks    Status  New    Target Date  05/03/18       PT LONG TERM GOAL #2   Title  Patient will ambualte 3000' without increased pain in order to go to the mall     Time  8    Period  Weeks    Status  New    Target Date  05/03/18      PT LONG TERM GOAL #3   Title  Patient will be independent with final HEP.  Time  8    Period  Weeks    Status  New    Target Date  05/03/18             Plan - 03/08/18 1457    Clinical Impression Statement  Patient is a 38 year old female with long standing lower and upper back pain. She presents with spasming of her lower back and upper traps. She has significant weakness of her left leg which effects her back. She also has poor balance. She also has some tightness in her hips and hamstrings. She is having difficulty when standing too long. She would benefit from skilled therapy to decrease muscle spasming and to increase core and left lower extremity strength.     History and Personal Factors relevant to plan of care:  MS; bilateral hip replacements     Clinical Presentation  Evolving    Clinical Decision Making  Moderate    Rehab Potential  Good    PT Frequency  2x / week    PT Duration  8 weeks    PT Treatment/Interventions  ADLs/Self Care Home Management;Cryotherapy;Electrical Stimulation;Iontophoresis 4mg /ml Dexamethasone;Traction;Ultrasound;Gait training;Stair training;Functional mobility training;Therapeutic exercise;Therapeutic activities;DME Instruction;Neuromuscular re-education;Patient/family education;Manual techniques;Manual lymph drainage;Passive range of motion;Dry needling;Taping    PT Next Visit Plan  soft tissue mobilization to the lumbar spine and upper back. genlte hip stretching. Patient has had bilateral hip replacements. Hip flexion strengthening; consider heel side; continue with PPT; may have to do standing hip flexion. Progress to SLR when able.  Patient will need to work on her balance at some point as well. We may want to suggest a cane for now until she can get her pain  under control.     Consulted and Agree with Plan of Care  Patient       Patient will benefit from skilled therapeutic intervention in order to improve the following deficits and impairments:  Abnormal gait, Pain, Decreased activity tolerance, Decreased endurance, Decreased range of motion, Decreased strength, Difficulty walking, Decreased balance, Increased muscle spasms, Improper body mechanics  Visit Diagnosis: Chronic bilateral low back pain without sciatica  Muscle spasm of back     Problem List Patient Active Problem List   Diagnosis Date Noted  . Anemia 01/09/2018  . Pulmonary embolism ([place]) 09/18/2017  . Elevated serum creatinine 05/12/2017  . Acute encephalopathy 04/20/2017  . Hydronephrosis of left kidney 10/24/2016  . Attention deficit 10/24/2016  . Left leg weakness 08/13/2016  . Chronic pain 08/13/2016  . Mild renal insufficiency 08/13/2016  . Left-sided weakness   . High risk medication use 09/15/2015  . Hip pain, bilateral 07/28/2015  . Urinary hesitancy 07/28/2015  . Multiple sclerosis exacerbation ([place]) 07/17/2015  . Urinary disorder 06/11/2015  . Gait disturbance 05/19/2015  . Numbness 05/19/2015  . Depression with anxiety 05/13/2015  . Other fatigue 05/13/2015  . Left knee pain 05/13/2015  . Avascular necrosis of bones of both hips ([place]) 05/13/2015  . Screening for HIV (human immunodeficiency virus) 07/08/2014  . Essential hypertension, benign 11/19/2013  . Anxiety state, unspecified 11/19/2013  . Screen for STD (sexually transmitted disease) 11/01/2013  . Encounter for routine gynecological examination 11/01/2013  . Oral contraceptive use 10/20/2011  . Multiple sclerosis ([place]) 08/08/2007  . RASH AND OTHER NONSPECIFIC SKIN ERUPTION 06/22/2007  . AVASCULAR NECROSIS, FEMORAL HEAD 03/11/2006  . Essential hypertension 02/03/2006  . 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 DVT, HX OF 02/03/2006    Carney Living PT DPT  03/08/2018, 4:20 PM  [facility] [address] [place], [place], [address] Phone: [phone]   Fax:  [phone]  Name: Dawn Peters MRN: [id number] Date of Birth: 06/17/1980

## 2018-03-09 ENCOUNTER — Encounter: Payer: Self-pay | Admitting: *Deleted

## 2018-03-12 ENCOUNTER — Telehealth: Payer: Self-pay | Admitting: Neurology

## 2018-03-12 DIAGNOSIS — N319 Neuromuscular dysfunction of bladder, unspecified: Secondary | ICD-10-CM | POA: Insufficient documentation

## 2018-03-12 DIAGNOSIS — N183 Chronic kidney disease, stage 3 (moderate): Secondary | ICD-10-CM | POA: Insufficient documentation

## 2018-03-12 DIAGNOSIS — I129 Hypertensive chronic kidney disease with stage 1 through stage 4 chronic kidney disease, or unspecified chronic kidney disease: Secondary | ICD-10-CM | POA: Insufficient documentation

## 2018-03-12 DIAGNOSIS — Z8614 Personal history of Methicillin resistant Staphylococcus aureus infection: Secondary | ICD-10-CM | POA: Insufficient documentation

## 2018-03-12 DIAGNOSIS — Z881 Allergy status to other antibiotic agents status: Secondary | ICD-10-CM | POA: Insufficient documentation

## 2018-03-12 DIAGNOSIS — Z9181 History of falling: Secondary | ICD-10-CM | POA: Diagnosis not present

## 2018-03-12 DIAGNOSIS — E669 Obesity, unspecified: Secondary | ICD-10-CM | POA: Insufficient documentation

## 2018-03-12 DIAGNOSIS — M545 Low back pain: Secondary | ICD-10-CM | POA: Insufficient documentation

## 2018-03-12 DIAGNOSIS — M6281 Muscle weakness (generalized): Secondary | ICD-10-CM | POA: Diagnosis not present

## 2018-03-12 DIAGNOSIS — G43909 Migraine, unspecified, not intractable, without status migrainosus: Secondary | ICD-10-CM | POA: Insufficient documentation

## 2018-03-12 DIAGNOSIS — Z86711 Personal history of pulmonary embolism: Secondary | ICD-10-CM | POA: Diagnosis not present

## 2018-03-12 DIAGNOSIS — G8929 Other chronic pain: Secondary | ICD-10-CM | POA: Insufficient documentation

## 2018-03-12 DIAGNOSIS — F418 Other specified anxiety disorders: Secondary | ICD-10-CM | POA: Insufficient documentation

## 2018-03-12 DIAGNOSIS — R3911 Hesitancy of micturition: Secondary | ICD-10-CM | POA: Insufficient documentation

## 2018-03-12 DIAGNOSIS — Z96643 Presence of artificial hip joint, bilateral: Secondary | ICD-10-CM | POA: Insufficient documentation

## 2018-03-12 DIAGNOSIS — Z86718 Personal history of other venous thrombosis and embolism: Secondary | ICD-10-CM | POA: Diagnosis not present

## 2018-03-12 DIAGNOSIS — Z23 Encounter for immunization: Secondary | ICD-10-CM | POA: Diagnosis not present

## 2018-03-12 DIAGNOSIS — Z6830 Body mass index (BMI) 30.0-30.9, adult: Secondary | ICD-10-CM | POA: Diagnosis not present

## 2018-03-12 DIAGNOSIS — Z79891 Long term (current) use of opiate analgesic: Secondary | ICD-10-CM | POA: Insufficient documentation

## 2018-03-12 DIAGNOSIS — N289 Disorder of kidney and ureter, unspecified: Secondary | ICD-10-CM | POA: Diagnosis not present

## 2018-03-12 DIAGNOSIS — Z888 Allergy status to other drugs, medicaments and biological substances status: Secondary | ICD-10-CM | POA: Diagnosis not present

## 2018-03-12 DIAGNOSIS — G35 Multiple sclerosis: Principal | ICD-10-CM | POA: Insufficient documentation

## 2018-03-12 DIAGNOSIS — N1330 Unspecified hydronephrosis: Secondary | ICD-10-CM | POA: Diagnosis not present

## 2018-03-12 DIAGNOSIS — K219 Gastro-esophageal reflux disease without esophagitis: Secondary | ICD-10-CM | POA: Insufficient documentation

## 2018-03-12 DIAGNOSIS — Z7901 Long term (current) use of anticoagulants: Secondary | ICD-10-CM | POA: Insufficient documentation

## 2018-03-12 DIAGNOSIS — R262 Difficulty in walking, not elsewhere classified: Secondary | ICD-10-CM | POA: Insufficient documentation

## 2018-03-12 DIAGNOSIS — Z79899 Other long term (current) drug therapy: Secondary | ICD-10-CM | POA: Insufficient documentation

## 2018-03-12 DIAGNOSIS — R29898 Other symptoms and signs involving the musculoskeletal system: Secondary | ICD-10-CM | POA: Diagnosis present

## 2018-03-12 NOTE — Telephone Encounter (Signed)
LMOM (identified vm) that per RAS, ok for 2 more days of IV SM. Orders given to [facility] in the infusion suite, so pt. can expect a call (it may be tomorrow when she hears from [place]) to schedule./fim

## 2018-03-12 NOTE — Telephone Encounter (Signed)
Called, LVM for pt advising I calling to set up appt for IV solumedrol. Asked her to call back and schedule.

## 2018-03-12 NOTE — Telephone Encounter (Signed)
Pt states that she generally gets 3 steroid treatments, she has only had 1.  Pt is asking for RN to call her to discuss getting her 2 other steroid injections.  Please call

## 2018-03-13 ENCOUNTER — Emergency Department (HOSPITAL_COMMUNITY): Payer: Medicare Other

## 2018-03-13 ENCOUNTER — Observation Stay (HOSPITAL_COMMUNITY)
Admission: EM | Admit: 2018-03-13 | Discharge: 2018-03-16 | Disposition: A | Discharge status: Home / Self Care | Payer: Medicare Other | Attending: Internal Medicine | Admitting: Internal Medicine

## 2018-03-13 ENCOUNTER — Encounter (HOSPITAL_COMMUNITY): Payer: Self-pay

## 2018-03-13 ENCOUNTER — Other Ambulatory Visit: Payer: Self-pay

## 2018-03-13 DIAGNOSIS — G35 Multiple sclerosis: Secondary | ICD-10-CM | POA: Diagnosis not present

## 2018-03-13 DIAGNOSIS — N183 Chronic kidney disease, stage 3 unspecified: Secondary | ICD-10-CM

## 2018-03-13 DIAGNOSIS — I10 Essential (primary) hypertension: Secondary | ICD-10-CM | POA: Diagnosis present

## 2018-03-13 DIAGNOSIS — R29898 Other symptoms and signs involving the musculoskeletal system: Secondary | ICD-10-CM | POA: Diagnosis not present

## 2018-03-13 DIAGNOSIS — N1831 Chronic kidney disease, stage 3a: Secondary | ICD-10-CM

## 2018-03-13 DIAGNOSIS — Z86711 Personal history of pulmonary embolism: Secondary | ICD-10-CM

## 2018-03-13 DIAGNOSIS — F418 Other specified anxiety disorders: Secondary | ICD-10-CM | POA: Diagnosis not present

## 2018-03-13 DIAGNOSIS — E669 Obesity, unspecified: Secondary | ICD-10-CM

## 2018-03-13 DIAGNOSIS — R3911 Hesitancy of micturition: Secondary | ICD-10-CM | POA: Diagnosis present

## 2018-03-13 DIAGNOSIS — N289 Disorder of kidney and ureter, unspecified: Secondary | ICD-10-CM | POA: Diagnosis not present

## 2018-03-13 DIAGNOSIS — I2699 Other pulmonary embolism without acute cor pulmonale: Secondary | ICD-10-CM | POA: Diagnosis present

## 2018-03-13 DIAGNOSIS — Z86718 Personal history of other venous thrombosis and embolism: Secondary | ICD-10-CM

## 2018-03-13 LAB — URINALYSIS, ROUTINE W REFLEX MICROSCOPIC
BACTERIA UA: NONE SEEN
Bilirubin Urine: NEGATIVE
Glucose, UA: NEGATIVE mg/dL
KETONES UR: 5 mg/dL — AB
Leukocytes, UA: NEGATIVE
Nitrite: NEGATIVE
Protein, ur: NEGATIVE mg/dL
Specific Gravity, Urine: 1.027 (ref 1.005–1.030)
pH: 5 (ref 5.0–8.0)

## 2018-03-13 LAB — CBC WITH DIFFERENTIAL/PLATELET
Abs Immature Granulocytes: 0.04 10*3/uL (ref 0.00–0.07)
Basophils Absolute: 0 10*3/uL (ref 0.0–0.1)
Basophils Relative: 0 %
Eosinophils Absolute: 0.2 10*3/uL (ref 0.0–0.5)
Eosinophils Relative: 3 %
HCT: 35.8 % — ABNORMAL LOW (ref 36.0–46.0)
Hemoglobin: 11.4 g/dL — ABNORMAL LOW (ref 12.0–15.0)
Immature Granulocytes: 1 %
Lymphocytes Relative: 20 %
Lymphs Abs: 0.9 10*3/uL (ref 0.7–4.0)
MCH: 34.1 pg — AB (ref 26.0–34.0)
MCHC: 31.8 g/dL (ref 30.0–36.0)
MCV: 107.2 fL — ABNORMAL HIGH (ref 80.0–100.0)
MONO ABS: 0.6 10*3/uL (ref 0.1–1.0)
Monocytes Relative: 13 %
Neutro Abs: 2.8 10*3/uL (ref 1.7–7.7)
Neutrophils Relative %: 63 %
Platelets: 361 10*3/uL (ref 150–400)
RBC: 3.34 MIL/uL — ABNORMAL LOW (ref 3.87–5.11)
RDW: 15.5 % (ref 11.5–15.5)
WBC: 4.5 10*3/uL (ref 4.0–10.5)
nRBC: 0 % (ref 0.0–0.2)

## 2018-03-13 LAB — BASIC METABOLIC PANEL
Anion gap: 6 (ref 5–15)
BUN: 21 mg/dL — ABNORMAL HIGH (ref 6–20)
CHLORIDE: 107 mmol/L (ref 98–111)
CO2: 28 mmol/L (ref 22–32)
Calcium: 8.9 mg/dL (ref 8.9–10.3)
Creatinine, Ser: 1.43 mg/dL — ABNORMAL HIGH (ref 0.44–1.00)
GFR calc Af Amer: 53 mL/min — ABNORMAL LOW (ref >60)
GFR calc non Af Amer: 46 mL/min — ABNORMAL LOW (ref >60)
Glucose, Bld: 97 mg/dL (ref 70–99)
Potassium: 4.1 mmol/L (ref 3.5–5.1)
Sodium: 141 mmol/L (ref 135–145)

## 2018-03-13 LAB — PREGNANCY, URINE: PREG TEST UR: NEGATIVE

## 2018-03-13 MED ORDER — LAMOTRIGINE 100 MG PO TABS
200.0000 mg | ORAL_TABLET | Freq: Two times a day (BID) | ORAL | Status: DC
  Administered 2018-03-13 – 2018-03-16 (×6): 200 mg via ORAL
  Filled 2018-03-13 (×7): qty 2

## 2018-03-13 MED ORDER — VALACYCLOVIR HCL 500 MG PO TABS
500.0000 mg | ORAL_TABLET | Freq: Two times a day (BID) | ORAL | Status: DC
  Administered 2018-03-13 – 2018-03-16 (×6): 500 mg via ORAL
  Filled 2018-03-13 (×7): qty 1

## 2018-03-13 MED ORDER — TIZANIDINE HCL 4 MG PO TABS
4.0000 mg | ORAL_TABLET | Freq: Every day | ORAL | Status: DC
  Administered 2018-03-13 – 2018-03-15 (×3): 4 mg via ORAL
  Filled 2018-03-13 (×2): qty 1

## 2018-03-13 MED ORDER — DIMETHYL FUMARATE 240 MG PO CPDR
240.0000 mg | DELAYED_RELEASE_CAPSULE | Freq: Two times a day (BID) | ORAL | Status: DC
  Administered 2018-03-13 – 2018-03-16 (×6): 240 mg via ORAL

## 2018-03-13 MED ORDER — BACLOFEN 20 MG PO TABS
20.0000 mg | ORAL_TABLET | Freq: Three times a day (TID) | ORAL | Status: DC
  Administered 2018-03-13 – 2018-03-16 (×10): 20 mg via ORAL
  Filled 2018-03-13: qty 1
  Filled 2018-03-13: qty 2
  Filled 2018-03-13 (×8): qty 1

## 2018-03-13 MED ORDER — HYDROXYZINE HCL 10 MG PO TABS
10.0000 mg | ORAL_TABLET | Freq: Three times a day (TID) | ORAL | Status: DC | PRN
  Filled 2018-03-13: qty 1

## 2018-03-13 MED ORDER — DALFAMPRIDINE ER 10 MG PO TB12
10.0000 mg | ORAL_TABLET | Freq: Two times a day (BID) | ORAL | Status: DC
  Administered 2018-03-13 – 2018-03-16 (×6): 10 mg via ORAL

## 2018-03-13 MED ORDER — ADULT MULTIVITAMIN W/MINERALS CH
1.0000 | ORAL_TABLET | Freq: Every day | ORAL | Status: DC
  Administered 2018-03-13 – 2018-03-16 (×4): 1 via ORAL
  Filled 2018-03-13 (×4): qty 1

## 2018-03-13 MED ORDER — CITALOPRAM HYDROBROMIDE 20 MG PO TABS
20.0000 mg | ORAL_TABLET | Freq: Every morning | ORAL | Status: DC
  Administered 2018-03-13 – 2018-03-16 (×4): 20 mg via ORAL
  Filled 2018-03-13 (×4): qty 1

## 2018-03-13 MED ORDER — SODIUM CHLORIDE 0.9 % IV SOLN
500.0000 mg | Freq: Once | INTRAVENOUS | Status: DC
  Filled 2018-03-13: qty 4

## 2018-03-13 MED ORDER — AMLODIPINE BESYLATE 5 MG PO TABS
5.0000 mg | ORAL_TABLET | Freq: Every morning | ORAL | Status: DC
  Administered 2018-03-13 – 2018-03-16 (×4): 5 mg via ORAL
  Filled 2018-03-13 (×4): qty 1

## 2018-03-13 MED ORDER — BACLOFEN 20 MG PO TABS: 20.0000 mg | ORAL_TABLET | Freq: Three times a day (TID) | ORAL | Status: DC | PRN

## 2018-03-13 MED ORDER — ACETAMINOPHEN 325 MG PO TABS: 650.0000 mg | ORAL_TABLET | Freq: Four times a day (QID) | ORAL | Status: DC | PRN

## 2018-03-13 MED ORDER — OXYCODONE HCL 5 MG PO TABS
5.0000 mg | ORAL_TABLET | ORAL | Status: DC | PRN
  Administered 2018-03-13 – 2018-03-16 (×6): 5 mg via ORAL
  Filled 2018-03-13 (×6): qty 1

## 2018-03-13 MED ORDER — METOPROLOL TARTRATE 12.5 MG HALF TABLET
12.5000 mg | ORAL_TABLET | Freq: Two times a day (BID) | ORAL | Status: DC
  Administered 2018-03-13 – 2018-03-16 (×6): 12.5 mg via ORAL
  Filled 2018-03-13 (×6): qty 1

## 2018-03-13 MED ORDER — SODIUM CHLORIDE 0.9 % IV SOLN
1000.0000 mg | Freq: Once | INTRAVENOUS | Status: AC
  Administered 2018-03-13: 1000 mg via INTRAVENOUS
  Filled 2018-03-13: qty 8

## 2018-03-13 MED ORDER — BUSPIRONE HCL 5 MG PO TABS
15.0000 mg | ORAL_TABLET | Freq: Two times a day (BID) | ORAL | Status: DC
  Administered 2018-03-13 – 2018-03-16 (×6): 15 mg via ORAL
  Filled 2018-03-13 (×6): qty 3

## 2018-03-13 MED ORDER — AMPHETAMINE-DEXTROAMPHET ER 10 MG PO CP24
20.0000 mg | ORAL_CAPSULE | Freq: Every day | ORAL | Status: DC
  Administered 2018-03-14 – 2018-03-16 (×3): 20 mg via ORAL
  Filled 2018-03-13 (×3): qty 2

## 2018-03-13 MED ORDER — LORAZEPAM 2 MG/ML IJ SOLN
1.0000 mg | Freq: Once | INTRAMUSCULAR | Status: AC | PRN
  Administered 2018-03-13: 1 mg via INTRAVENOUS
  Filled 2018-03-13: qty 1

## 2018-03-13 MED ORDER — APIXABAN 5 MG PO TABS
5.0000 mg | ORAL_TABLET | Freq: Two times a day (BID) | ORAL | Status: DC
  Administered 2018-03-13 – 2018-03-16 (×6): 5 mg via ORAL
  Filled 2018-03-13 (×6): qty 1

## 2018-03-13 MED ORDER — TAMSULOSIN HCL 0.4 MG PO CAPS
0.4000 mg | ORAL_CAPSULE | Freq: Every day | ORAL | Status: DC
  Administered 2018-03-13 – 2018-03-16 (×4): 0.4 mg via ORAL
  Filled 2018-03-13 (×4): qty 1

## 2018-03-13 MED ORDER — INFLUENZA VAC SPLIT QUAD 0.5 ML IM SUSY
0.5000 mL | PREFILLED_SYRINGE | INTRAMUSCULAR | Status: AC
  Administered 2018-03-14: 0.5 mL via INTRAMUSCULAR
  Filled 2018-03-13: qty 0.5

## 2018-03-13 MED ORDER — POLYETHYLENE GLYCOL 3350 17 G PO PACK: 17.0000 g | PACK | Freq: Every day | ORAL | Status: DC | PRN

## 2018-03-13 MED ORDER — FENTANYL CITRATE (PF) 100 MCG/2ML IJ SOLN
50.0000 ug | Freq: Once | INTRAMUSCULAR | Status: AC
  Administered 2018-03-13: 50 ug via INTRAVENOUS
  Filled 2018-03-13: qty 2

## 2018-03-13 MED ORDER — GABAPENTIN 400 MG PO CAPS
800.0000 mg | ORAL_CAPSULE | Freq: Three times a day (TID) | ORAL | Status: DC
  Administered 2018-03-13 – 2018-03-16 (×9): 800 mg via ORAL
  Filled 2018-03-13 (×9): qty 2

## 2018-03-13 MED ORDER — GADOBUTROL 1 MMOL/ML IV SOLN
10.0000 mL | Freq: Once | INTRAVENOUS | Status: AC | PRN
  Administered 2018-03-13: 10 mL via INTRAVENOUS

## 2018-03-13 MED ORDER — GABAPENTIN 400 MG PO CAPS
800.0000 mg | ORAL_CAPSULE | Freq: Once | ORAL | Status: AC
  Administered 2018-03-13: 800 mg via ORAL
  Filled 2018-03-13: qty 2

## 2018-03-13 MED ORDER — SODIUM CHLORIDE 0.9 % IV SOLN
1000.0000 mg | Freq: Every day | INTRAVENOUS | Status: AC
  Administered 2018-03-14 – 2018-03-15 (×2): 1000 mg via INTRAVENOUS
  Filled 2018-03-13 (×3): qty 8

## 2018-03-13 MED ORDER — AMANTADINE HCL 100 MG PO CAPS
100.0000 mg | ORAL_CAPSULE | Freq: Two times a day (BID) | ORAL | Status: DC
  Administered 2018-03-13 – 2018-03-16 (×6): 100 mg via ORAL
  Filled 2018-03-13 (×7): qty 1

## 2018-03-13 MED ORDER — TIZANIDINE HCL 4 MG PO TABS
4.0000 mg | ORAL_TABLET | Freq: Every day | ORAL | Status: DC
  Filled 2018-03-13: qty 1

## 2018-03-13 MED ORDER — ONDANSETRON HCL 4 MG PO TABS: 4.0000 mg | ORAL_TABLET | Freq: Four times a day (QID) | ORAL | Status: DC | PRN

## 2018-03-13 MED ORDER — AMITRIPTYLINE HCL 25 MG PO TABS
25.0000 mg | ORAL_TABLET | Freq: Every day | ORAL | Status: DC
  Administered 2018-03-13: 50 mg via ORAL
  Filled 2018-03-13 (×2): qty 2

## 2018-03-13 MED ORDER — ONDANSETRON HCL 4 MG/2ML IJ SOLN: 4.0000 mg | Freq: Four times a day (QID) | INTRAMUSCULAR | Status: DC | PRN

## 2018-03-13 MED ORDER — ACETAMINOPHEN 650 MG RE SUPP: 650.0000 mg | Freq: Four times a day (QID) | RECTAL | Status: DC | PRN

## 2018-03-13 MED ORDER — SUMATRIPTAN SUCCINATE 50 MG PO TABS
100.0000 mg | ORAL_TABLET | Freq: Once | ORAL | Status: DC | PRN
  Filled 2018-03-13: qty 2

## 2018-03-13 MED ORDER — PREGABALIN 75 MG PO CAPS
225.0000 mg | ORAL_CAPSULE | Freq: Two times a day (BID) | ORAL | Status: DC
  Administered 2018-03-13 – 2018-03-16 (×6): 225 mg via ORAL
  Filled 2018-03-13 (×6): qty 3

## 2018-03-13 MED ORDER — OXCARBAZEPINE 300 MG PO TABS
300.0000 mg | ORAL_TABLET | Freq: Two times a day (BID) | ORAL | Status: DC
  Administered 2018-03-13 – 2018-03-16 (×6): 300 mg via ORAL
  Filled 2018-03-13 (×7): qty 1

## 2018-03-13 NOTE — ED Notes (Signed)
ED TO INPATIENT HANDOFF REPORT  Name/Age/Gender Dawn Peters 38 y.o. female  Code Status Code Status History    Date Active Date Inactive Code Status Order ID Comments User Context   09/18/2017 0806 09/19/2017 2152 Full Code [id number]  Cristal Ford, DO Inpatient   05/19/2017 6761 05/23/2017 2138 Full Code [id number]  Hosie Poisson, MD Inpatient   04/20/2017 0504 04/24/2017 2039 Full Code [id number]  Norval Morton, MD ED   08/13/2016 0351 08/14/2016 1808 Full Code [id number]  Vianne Bulls, MD ED   12/22/2015 0105 12/23/2015 2157 Full Code [id number]  [name], DO Inpatient   07/17/2015 0956 07/19/2015 1340 Full Code [id number]  Kelvin Cellar, MD Inpatient      Home/SNF/Other Home  Chief Complaint MS; Back Pain; Unable to walk  Level of Care/Admitting Diagnosis ED Disposition    ED Disposition Condition Comment   Admit  Hospital Area: [facility] [[id number]]  Level of Care: Med-Surg [16]  Diagnosis: MS (mitral stenosis) [299242]  Admitting Physician: Shelbie Proctor [[id number]]  Attending Physician: Shelbie Proctor [[id number]]  PT Class (Do Not Modify): Observation [104]  PT Acc Code (Do Not Modify): Observation [10022]       Medical History Past Medical History:  Diagnosis Date  . Anticoagulant long-term use    Eliquis  . Anxiety   . Chronic pain   . CKD (chronic kidney disease), stage III ([place])   . Depression   . 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 Gait disturbance    due to MS  . GERD (gastroesophageal reflux disease)    watch diet  . 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 History of DVT of lower extremity 2007  . History of encephalopathy 92/1194   acute metabolic encephalopathy secondary to MS,  resolved  . History of MRSA infection 2004   right hip infection post THA  . History of  pulmonary embolus (PE) 2005  . Hydronephrosis, left    w/ acute kidney injury 02/ 2019  . Hypertension   . Left-sided weakness    due to MS  . Migraines   . MS (multiple sclerosis) [facility]) neurologist-  dr Renne Musca   dx 2000--  . Muscle spasticity   . Neurogenic bladder    due to MS  . Pulmonary embolism, bilateral ([place]) 09/17/2017   treated w/ eliquis  . Strains to urinate   . Urgency of urination     Allergies Allergies  Allergen Reactions  . Ace Inhibitors Swelling  . Amoxicillin Itching  . Lisinopril Swelling    Lower lip swelling    IV Location/Drains/Wounds Patient Lines/Drains/Airways Status   Active Line/Drains/Airways    Name:   Placement date:   Placement time:   Site:   Days:   Peripheral IV 03/13/18 Left Hand   03/13/18    0436    Hand   less than 1   Ureteral Drain/Stent Left ureter 6 Fr.   11/29/17    1433    Left ureter   104          Labs/Imaging Results for orders placed or performed during the hospital encounter of 03/13/18 (from the past 48 hour(s))  CBC with Differential     Status: Abnormal   Collection Time: 03/13/18  4:38 AM  Result Value Ref Range   WBC 4.5 4.0 - 10.5 K/uL  RBC 3.34 (L) 3.87 - 5.11 MIL/uL   Hemoglobin 11.4 (L) 12.0 - 15.0 g/dL   HCT 35.8 (L) 36.0 - 46.0 %   MCV 107.2 (H) 80.0 - 100.0 fL   MCH 34.1 (H) 26.0 - 34.0 pg   MCHC 31.8 30.0 - 36.0 g/dL   RDW 15.5 11.5 - 15.5 %   Platelets 361 150 - 400 K/uL   nRBC 0.0 0.0 - 0.2 %   Neutrophils Relative % 63 %   Neutro Abs 2.8 1.7 - 7.7 K/uL   Lymphocytes Relative 20 %   Lymphs Abs 0.9 0.7 - 4.0 K/uL   Monocytes Relative 13 %   Monocytes Absolute 0.6 0.1 - 1.0 K/uL   Eosinophils Relative 3 %   Eosinophils Absolute 0.2 0.0 - 0.5 K/uL   Basophils Relative 0 %   Basophils Absolute 0.0 0.0 - 0.1 K/uL   Immature Granulocytes 1 %   Abs Immature Granulocytes 0.04 0.00 - 0.07 K/uL    Comment: Performed at [facility], [place] [address], [place], [place] [address]   Basic metabolic panel     Status: Abnormal   Collection Time: 03/13/18  4:38 AM  Result Value Ref Range   Sodium 141 135 - 145 mmol/L   Potassium 4.1 3.5 - 5.1 mmol/L   Chloride 107 98 - 111 mmol/L   CO2 28 22 - 32 mmol/L   Glucose, Bld 97 70 - 99 mg/dL   BUN 21 (H) 6 - 20 mg/dL   Creatinine, Ser 1.43 (H) 0.44 - 1.00 mg/dL   Calcium 8.9 8.9 - 10.3 mg/dL   GFR calc non Af Amer 46 (L) >60 mL/min   GFR calc Af Amer 53 (L) >60 mL/min   Anion gap 6 5 - 15    Comment: Performed at [facility], [place] [address], [place], [place] [address]  Urinalysis, Routine w reflex microscopic     Status: Abnormal   Collection Time: 03/13/18  4:38 AM  Result Value Ref Range   Color, Urine YELLOW YELLOW   APPearance HAZY (A) CLEAR   Specific Gravity, Urine 1.027 1.005 - 1.030   pH 5.0 5.0 - 8.0   Glucose, UA NEGATIVE NEGATIVE mg/dL   Hgb urine dipstick LARGE (A) NEGATIVE   Bilirubin Urine NEGATIVE NEGATIVE   Ketones, ur 5 (A) NEGATIVE mg/dL   Protein, ur NEGATIVE NEGATIVE mg/dL   Nitrite NEGATIVE NEGATIVE   Leukocytes, UA NEGATIVE NEGATIVE   RBC / HPF 0-5 0 - 5 RBC/hpf   WBC, UA 0-5 0 - 5 WBC/hpf   Bacteria, UA NONE SEEN NONE SEEN   Squamous Epithelial / LPF 0-5 0 - 5   Mucus PRESENT     Comment: Performed at [facility], [place] [address], [place], [place] [address]  Pregnancy, urine     Status: None   Collection Time: 03/13/18  4:38 AM  Result Value Ref Range   Preg Test, Ur NEGATIVE NEGATIVE    Comment:        THE SENSITIVITY OF THIS METHODOLOGY IS >20 mIU/mL. Performed at [facility], [place] [address], [place], [place] [address]    Mr Brain W And Wo Contrast  Result Date: 03/13/2018 CLINICAL DATA:  Left flank pain and tingling. Personal history of multiple sclerosis. One week history of tingling in the left lower extremity with progressive weakness and spasticity. EXAM: MRI HEAD WITHOUT AND WITH CONTRAST MRI CERVICAL SPINE WITHOUT AND  WITH CONTRAST MRI THORACIC  SPINE WITHOUT AND WITH CONTRAST TECHNIQUE: Multiplanar, multiecho pulse sequences of the brain and surrounding structures, and cervical spine, to include the craniocervical junction and cervicothoracic junction, and the , were obtained without and with intravenous contrast. CONTRAST:  10 mL Gadavist COMPARISON:  None. FINDINGS: MRI HEAD FINDINGS Brain: The study is moderately degraded by patient motion. Periventricular white matter changes bilaterally are stable. No new lesions are present. There is no enhancement or restricted diffusion. No acute infarct, hemorrhage, or mass lesion is present. The ventricles are of normal size. No significant extraaxial fluid collection is present. Postcontrast images demonstrate no pathologic enhancement. Vascular: Flow is present in the major intracranial arteries. Skull and upper cervical spine: The craniocervical junction is normal. Upper cervical spine is within normal limits. Marrow signal is unremarkable. Sinuses/Orbits: The paranasal sinuses and mastoid air cells are clear. The globes and orbits are within normal limits. MRI CERVICAL SPINE FINDINGS Alignment: AP alignment is anatomic. There is no significant scoliosis. Vertebrae: Normal signal is present. Vertebral body heights alignment are normal. No pathologic enhancement is present. Cord: Normal signal is present in the cervical and upper thoracic spinal cord to the lowest imaged level, T2-3. No pathologic enhancement is present Posterior Fossa, vertebral arteries, paraspinal tissues: Craniocervical junction is normal. Flow is present in the major intracranial arteries. Disc levels: No significant disc disease or stenosis is present within the cervical spine. MRI THORACIC SPINE FINDINGS Alignment: AP alignment is anatomic. Vertebrae: Marrow signal and vertebral body heights are normal. No pathologic enhancement is present. Cord: Normal signal is present throughout the spinal cord to the conus  medullaris which terminates at L1-2. Paraspinal tissues: Left-sided hydronephrosis is again seen. Limited imaging of the abdomen is otherwise unremarkable. Disc levels: No significant focal disc intrusion or stenosis is present. Foramina are patent bilaterally. IMPRESSION: 1. Stable periventricular white matter changes compatible with the given diagnosis of multiple sclerosis. No evidence for disease progression. 2. No acute intracranial abnormality. 3. No significant demyelinating disease within the cervical or thoracic spine. 4. Significant disc disease in the cervical or thoracic spine. 5. Chronic left-sided hydronephrosis. Electronically Signed   By: San Morelle M.D.   On: 03/13/2018 15:57   Mr Cervical Spine W Or Wo Contrast  Result Date: 03/13/2018 CLINICAL DATA:  Left flank pain and tingling. Personal history of multiple sclerosis. One week history of tingling in the left lower extremity with progressive weakness and spasticity. EXAM: MRI HEAD WITHOUT AND WITH CONTRAST MRI CERVICAL SPINE WITHOUT AND WITH CONTRAST MRI THORACIC  SPINE WITHOUT AND WITH CONTRAST TECHNIQUE: Multiplanar, multiecho pulse sequences of the brain and surrounding structures, and cervical spine, to include the craniocervical junction and cervicothoracic junction, and the , were obtained without and with intravenous contrast. CONTRAST:  10 mL Gadavist COMPARISON:  None. FINDINGS: MRI HEAD FINDINGS Brain: The study is moderately degraded by patient motion. Periventricular white matter changes bilaterally are stable. No new lesions are present. There is no enhancement or restricted diffusion. No acute infarct, hemorrhage, or mass lesion is present. The ventricles are of normal size. No significant extraaxial fluid collection is present. Postcontrast images demonstrate no pathologic enhancement. Vascular: Flow is present in the major intracranial arteries. Skull and upper cervical spine: The craniocervical junction is normal.  Upper cervical spine is within normal limits. Marrow signal is unremarkable. Sinuses/Orbits: The paranasal sinuses and mastoid air cells are clear. The globes and orbits are within normal limits. MRI CERVICAL SPINE FINDINGS Alignment: AP alignment is anatomic. There is no significant scoliosis.  Vertebrae: Normal signal is present. Vertebral body heights alignment are normal. No pathologic enhancement is present. Cord: Normal signal is present in the cervical and upper thoracic spinal cord to the lowest imaged level, T2-3. No pathologic enhancement is present Posterior Fossa, vertebral arteries, paraspinal tissues: Craniocervical junction is normal. Flow is present in the major intracranial arteries. Disc levels: No significant disc disease or stenosis is present within the cervical spine. MRI THORACIC SPINE FINDINGS Alignment: AP alignment is anatomic. Vertebrae: Marrow signal and vertebral body heights are normal. No pathologic enhancement is present. Cord: Normal signal is present throughout the spinal cord to the conus medullaris which terminates at L1-2. Paraspinal tissues: Left-sided hydronephrosis is again seen. Limited imaging of the abdomen is otherwise unremarkable. Disc levels: No significant focal disc intrusion or stenosis is present. Foramina are patent bilaterally. IMPRESSION: 1. Stable periventricular white matter changes compatible with the given diagnosis of multiple sclerosis. No evidence for disease progression. 2. No acute intracranial abnormality. 3. No significant demyelinating disease within the cervical or thoracic spine. 4. Significant disc disease in the cervical or thoracic spine. 5. Chronic left-sided hydronephrosis. Electronically Signed   By: San Morelle M.D.   On: 03/13/2018 15:57   Mr Thoracic Spine W Wo Contrast  Result Date: 03/13/2018 CLINICAL DATA:  Left flank pain and tingling. Personal history of multiple sclerosis. One week history of tingling in the left lower  extremity with progressive weakness and spasticity. EXAM: MRI HEAD WITHOUT AND WITH CONTRAST MRI CERVICAL SPINE WITHOUT AND WITH CONTRAST MRI THORACIC  SPINE WITHOUT AND WITH CONTRAST TECHNIQUE: Multiplanar, multiecho pulse sequences of the brain and surrounding structures, and cervical spine, to include the craniocervical junction and cervicothoracic junction, and the , were obtained without and with intravenous contrast. CONTRAST:  10 mL Gadavist COMPARISON:  None. FINDINGS: MRI HEAD FINDINGS Brain: The study is moderately degraded by patient motion. Periventricular white matter changes bilaterally are stable. No new lesions are present. There is no enhancement or restricted diffusion. No acute infarct, hemorrhage, or mass lesion is present. The ventricles are of normal size. No significant extraaxial fluid collection is present. Postcontrast images demonstrate no pathologic enhancement. Vascular: Flow is present in the major intracranial arteries. Skull and upper cervical spine: The craniocervical junction is normal. Upper cervical spine is within normal limits. Marrow signal is unremarkable. Sinuses/Orbits: The paranasal sinuses and mastoid air cells are clear. The globes and orbits are within normal limits. MRI CERVICAL SPINE FINDINGS Alignment: AP alignment is anatomic. There is no significant scoliosis. Vertebrae: Normal signal is present. Vertebral body heights alignment are normal. No pathologic enhancement is present. Cord: Normal signal is present in the cervical and upper thoracic spinal cord to the lowest imaged level, T2-3. No pathologic enhancement is present Posterior Fossa, vertebral arteries, paraspinal tissues: Craniocervical junction is normal. Flow is present in the major intracranial arteries. Disc levels: No significant disc disease or stenosis is present within the cervical spine. MRI THORACIC SPINE FINDINGS Alignment: AP alignment is anatomic. Vertebrae: Marrow signal and vertebral body  heights are normal. No pathologic enhancement is present. Cord: Normal signal is present throughout the spinal cord to the conus medullaris which terminates at L1-2. Paraspinal tissues: Left-sided hydronephrosis is again seen. Limited imaging of the abdomen is otherwise unremarkable. Disc levels: No significant focal disc intrusion or stenosis is present. Foramina are patent bilaterally. IMPRESSION: 1. Stable periventricular white matter changes compatible with the given diagnosis of multiple sclerosis. No evidence for disease progression. 2. No acute intracranial abnormality. 3.  No significant demyelinating disease within the cervical or thoracic spine. 4. Significant disc disease in the cervical or thoracic spine. 5. Chronic left-sided hydronephrosis. Electronically Signed   By: San Morelle M.D.   On: 03/13/2018 15:57   None  Pending Labs Unresulted Labs (From admission, onward)   None      Vitals/Pain Today's Vitals   03/13/18 1806 03/13/18 1847 03/13/18 1853 03/13/18 1900  BP:  136/89 120/82 125/85  Pulse:  (!) 103 (!) 103 (!) 103  Resp:  17 17 15   Temp:      TempSrc:      SpO2:  93% 94% 96%  Weight:      Height:      PainSc: 9        Isolation Precautions No active isolations  Medications Medications  baclofen (LIORESAL) tablet 20 mg (20 mg Oral Given 03/13/18 1802)  methylPREDNISolone sodium succinate (SOLU-MEDROL) 1,000 mg in sodium chloride 0.9 % 50 mL IVPB (has no administration in time range)  methylPREDNISolone sodium succinate (SOLU-MEDROL) 1,000 mg in sodium chloride 0.9 % 50 mL IVPB (0 mg Intravenous Stopped 03/13/18 1715)  fentaNYL (SUBLIMAZE) injection 50 mcg (50 mcg Intravenous Given 03/13/18 0754)  LORazepam (ATIVAN) injection 1 mg (1 mg Intravenous Given 03/13/18 1352)  gadobutrol (GADAVIST) 1 MMOL/ML injection 10 mL (10 mLs Intravenous Contrast Given 03/13/18 1517)  gabapentin (NEURONTIN) capsule 800 mg (800 mg Oral Given 03/13/18 1803)    Mobility walks with  person assist

## 2018-03-13 NOTE — ED Provider Notes (Signed)
[facility] DEPT Provider Note   CSN: [id number] Arrival date & time: 03/12/18  2357     History   Chief Complaint Chief Complaint  Patient presents with  . MS flare    HPI Dawn Peters is a 38 y.o. female.  Patient with a history of MS, follow by Dr. Felecia Shelling (Neurology) and treated with daily Tecfidera, presents with one week of left LE tingling, progressive weakness and spasticity c/w MS exacerbation. No fever, nausea, vomiting, cough, congestion or urinary symptoms. She reports falls in the past week secondary to left leg symptoms but denies injury.   The history is provided by the patient. No language interpreter was used.    Past Medical History:  Diagnosis Date  . Anticoagulant long-term use    Eliquis  . Anxiety   . Chronic pain   . CKD (chronic kidney disease), stage III ([place])   . Depression   . 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 Gait disturbance    due to MS  . GERD (gastroesophageal reflux disease)    watch diet  . 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 History of DVT of lower extremity 2007  . History of encephalopathy 61/9509   acute metabolic encephalopathy secondary to MS,  resolved  . History of MRSA infection 2004   right hip infection post THA  . History of pulmonary embolus (PE) 2005  . Hydronephrosis, left    w/ acute kidney injury 02/ 2019  . Hypertension   . Left-sided weakness    due to MS  . Migraines   . MS (multiple sclerosis) [facility]) neurologist-  dr Renne Musca   dx 2000--  . Muscle spasticity   . Neurogenic bladder    due to MS  . Pulmonary embolism, bilateral ([place]) 09/17/2017   treated w/ eliquis  . Strains to urinate   . Urgency of urination     Patient Active Problem List   Diagnosis Date Noted  . Anemia 01/09/2018  . Pulmonary embolism ([place]) 09/18/2017  .  Elevated serum creatinine 05/12/2017  . Acute encephalopathy 04/20/2017  . Hydronephrosis of left kidney 10/24/2016  . Attention deficit 10/24/2016  . Left leg weakness 08/13/2016  . Chronic pain 08/13/2016  . Mild renal insufficiency 08/13/2016  . Left-sided weakness   . High risk medication use 09/15/2015  . Hip pain, bilateral 07/28/2015  . Urinary hesitancy 07/28/2015  . Multiple sclerosis exacerbation ([place]) 07/17/2015  . Urinary disorder 06/11/2015  . Gait disturbance 05/19/2015  . Numbness 05/19/2015  . Depression with anxiety 05/13/2015  . Other fatigue 05/13/2015  . Left knee pain 05/13/2015  . Avascular necrosis of bones of both hips ([place]) 05/13/2015  . Screening for HIV (human immunodeficiency virus) 07/08/2014  . Essential hypertension, benign 11/19/2013  . Anxiety state, unspecified 11/19/2013  . Screen for STD (sexually transmitted disease) 11/01/2013  . Encounter for routine gynecological examination 11/01/2013  . Oral contraceptive use 10/20/2011  . Multiple sclerosis ([place]) 08/08/2007  . RASH AND OTHER NONSPECIFIC SKIN ERUPTION 06/22/2007  . AVASCULAR NECROSIS, FEMORAL HEAD 03/11/2006  . Essential hypertension 02/03/2006  . 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 DVT, HX OF 02/03/2006    Past Surgical History:  Procedure Laterality Date  . CYSTOSCOPY WITH RETROGRADE PYELOGRAM, URETEROSCOPY AND STENT PLACEMENT Bilateral 11/29/2017   Procedure: BILATERAL  RETROGRADE PYELOGRAM, LEFT DIAGNOSIC URETEROSCOPY AND STENT  LEFT PLACEMENT;  Surgeon: Ardis Hughs, MD;  [facility];  Service: Urology;  Laterality: Bilateral;  . HIP ARTHROSCOPY Left 07-05-2013   dr pill @[facility]    iliopsosas release, synovectomy  . REVISION TOTAL HIP ARTHROPLASTY Right 03-28-2003    dr Alvan Dame @[facility]   . TOTAL HIP ARTHROPLASTY Bilateral left 11-05-2002  dr Alvan Dame @[facility] ;   right 06/ 2004  @[facility]      OB History    Gravida  1   Para  1   Term  1   Preterm      AB       Living  1     SAB      TAB      Ectopic      Multiple      Live Births  1            Home Medications    Prior to Admission medications   Medication Sig Start Date End Date Taking? Authorizing Provider  amantadine (SYMMETREL) 100 MG capsule Take 100 mg by mouth 2 (two) times daily. 09/11/17   [provider]  amitriptyline (ELAVIL) 25 MG tablet TAKE 1-2 TABLETS BY MOUTH AT BEDTIME 02/26/18   Sater, Nanine Means, MD  amLODipine (NORVASC) 5 MG tablet Take 1 tablet (5 mg total) by mouth daily. Patient taking differently: Take 5 mg by mouth every morning.  05/21/17   Arrien, Jimmy Picket, MD  amphetamine-dextroamphetamine (ADDERALL XR) 20 MG 24 hr capsule Take 1 capsule (20 mg total) by mouth daily. 01/18/18   Sater, Nanine Means, MD  apixaban (ELIQUIS) 5 MG TABS tablet Take 1 tablet (5 mg total) by mouth 2 (two) times daily. 09/26/17   Kayleen Memos, DO  baclofen (LIORESAL) 20 MG tablet Take 1 tablet (20 mg total) by mouth 3 (three) times daily as needed (for ms). Patient taking differently: Take 20 mg by mouth 4 (four) times daily.  08/03/17   Sater, Nanine Means, MD  busPIRone (BUSPAR) 15 MG tablet Take 1 tablet (15 mg total) by mouth 2 (two) times daily. 08/02/17   Sater, Nanine Means, MD  citalopram (CELEXA) 20 MG tablet Take 20 mg by mouth every morning.  09/11/17   [provider]  dalfampridine 10 MG TB12 Take 1 tablet (10 mg total) by mouth 2 (two) times daily. Patient taking differently: Take 10 mg by mouth 2 (two) times daily.  08/18/17   Sater, Nanine Means, MD  Dimethyl Fumarate (TECFIDERA) 240 MG CPDR Take 240 mg by mouth 2 (two) times daily.     [provider]  gabapentin (NEURONTIN) 800 MG tablet Take 1 tablet (800 mg total) by mouth 3 (three) times daily. 10/19/17   Sater, Nanine Means, MD  hydrOXYzine (ATARAX/VISTARIL) 10 MG tablet Take 10 mg by mouth 3 (three) times daily as needed for anxiety. 09/11/17   [provider]  lamoTRIgine (LAMICTAL) 200 MG  tablet Take 1 tablet (200 mg total) by mouth 2 (two) times daily. 08/05/16   Sater, Nanine Means, MD  methylPREDNISolone (MEDROL) 4 MG tablet Take as directed. Patient taking differently: Take as directed.   (11-24-2017 per pt currently taking tid) 11/16/17   Sater, Nanine Means, MD  metoprolol tartrate (LOPRESSOR) 25 MG tablet Take 0.5 tablets (12.5 mg total) by mouth 2 (two) times daily. 05/21/17 11/29/17  Arrien, Jimmy Picket, MD  Multiple Vitamin (MULTIVITAMIN WITH MINERALS) TABS tablet Take 1 tablet by mouth daily. 09/20/17   Kayleen Memos, DO  Oxcarbazepine (TRILEPTAL)  300 MG tablet Take 1 tablet (300 mg total) by mouth 2 (two) times daily. 11/11/15   Sater, Nanine Means, MD  phenazopyridine (PYRIDIUM) 200 MG tablet Take 1 tablet (200 mg total) by mouth 3 (three) times daily as needed for pain. 11/29/17   Ardis Hughs, MD  pregabalin (LYRICA) 225 MG capsule Take 1 capsule (225 mg total) by mouth 2 (two) times daily. 09/20/17   Sater, Nanine Means, MD  SUMAtriptan (IMITREX) 100 MG tablet Take 1 tablet (100 mg total) by mouth once as needed for migraine. May repeat in 2 hours if headache persists or recurs. 12/28/16   Sater, Nanine Means, MD  tamsulosin (FLOMAX) 0.4 MG CAPS capsule Take 1 capsule (0.4 mg total) by mouth daily. Patient taking differently: Take 0.4 mg by mouth daily after breakfast.  09/20/17   Sater, Nanine Means, MD  tiZANidine (ZANAFLEX) 4 MG tablet TAKE 1 TABLET BY MOUTH AT BEDTIME 02/26/18   Sater, Nanine Means, MD  traMADol (ULTRAM-ER) 300 MG 24 hr tablet Take 1 tablet (300 mg total) by mouth daily. 11/27/17   Sater, Nanine Means, MD  valACYclovir (VALTREX) 500 MG tablet Take 1 tablet (500 mg total) by mouth 2 (two) times daily. 03/20/17   Sater, Nanine Means, MD    Family History Family History  Problem Relation Age of Onset  . Healthy Mother   . Healthy Father   . Breast cancer Other        paternal grandmother dx age 3  . Colon cancer Other        paternal grandmother  . Hypertension Other    . Diabetes Other     Social History Social History   Tobacco Use  . Smoking status: Never Smoker  . Smokeless tobacco: Never Used  Substance Use Topics  . Alcohol use: No  . Drug use: No     Allergies   Ace inhibitors; Amoxicillin; and Lisinopril   Review of Systems Review of Systems  Constitutional: Negative for chills and fever.  HENT: Negative.   Respiratory: Negative.   Cardiovascular: Negative.   Gastrointestinal: Negative.   Musculoskeletal:       See HPI.  Skin: Negative.   Neurological:       See HPI.     Physical Exam Updated Vital Signs BP 124/82 (BP Location: Left Arm)   Pulse 94   Temp 98.3 F (36.8 C) (Oral)   Resp 16   Ht 5\' 6"  (1.676 m)   Wt 99.2 kg   LMP 03/13/2018   SpO2 99%   BMI 35.28 kg/m   Physical Exam Vitals signs and nursing note reviewed.  Constitutional:      Appearance: She is well-developed.  HENT:     Head: Normocephalic.  Neck:     Musculoskeletal: Normal range of motion and neck supple.  Cardiovascular:     Rate and Rhythm: Normal rate and regular rhythm.  Pulmonary:     Effort: Pulmonary effort is normal.     Breath sounds: Normal breath sounds.  Abdominal:     General: Bowel sounds are normal.     Palpations: Abdomen is soft.     Tenderness: There is no abdominal tenderness. There is no guarding or rebound.  Musculoskeletal: Normal range of motion.  Skin:    General: Skin is warm and dry.     Findings: No rash.  Neurological:     Mental Status: She is alert.     Comments: Decreased reflexes bilateral LE's. Normal sensation. Normal  muscle tone.       ED Treatments / Results  Labs (all labs ordered are listed, but only abnormal results are displayed) Labs Reviewed  CBC WITH DIFFERENTIAL/PLATELET  BASIC METABOLIC PANEL  URINALYSIS, ROUTINE W REFLEX MICROSCOPIC  PREGNANCY, URINE    EKG None  Radiology No results found.  Procedures Procedures (including critical care time)  Medications Ordered  in ED Medications - No data to display   Initial Impression / Assessment and Plan / ED Course  I have reviewed the triage vital signs and the nursing notes.  Pertinent labs & imaging results that were available during my care of the patient were reviewed by me and considered in my medical decision making (see chart for details).     Patient with 20+ year history of MS here with symptoms typical of exacerbation x 1 week, getting progressively worse. Now causing difficulty with ambulation and falls. No fever or symptoms of acute illness. No injury from falls this week.   Patient chart reviewed. Will start on 500 mg Solumedrol. Labs pending. Anticipate admission for multi-day therapy as has been the typical treatment course in the past. Will consult neurology.  Discussed with Dr. Leonel Ramsay who advised she will need MR studies, which have been ordered. When studies have resulted, call neuro back with results for review and further recommendation. He feels she may need to be seen by neurology to determine appropriate disposition based on MR results.  Patient care signed out to [facility], [place], pending MR results.  Final Clinical Impressions(s) / ED Diagnoses   Final diagnoses:  None   1. Left lower extremity pain 2. Left lower extremity weakness 3. History of MS ED Discharge Orders    None       Charlann Lange, Hershal Coria 03/13/18 6767    Ripley Fraise, MD 03/13/18 [phone]

## 2018-03-13 NOTE — ED Notes (Signed)
Pt ambulated to bathroom 

## 2018-03-13 NOTE — ED Notes (Signed)
Pt is alert and oriented x 4 and is verbally responsive. Pt denies pain at this time. Pt has visitor at bedside.

## 2018-03-13 NOTE — ED Provider Notes (Signed)
Physical Exam  BP 122/87 (BP Location: Right Arm)   Pulse (!) 102   Temp 98.3 F (36.8 C) (Oral)   Resp 17   Ht 5\' 6"  (1.676 m)   Wt 99.2 kg   LMP 03/13/2018   SpO2 100%   BMI 35.28 kg/m   Physical Exam Vitals signs and nursing note reviewed.  Constitutional:      General: She is not in acute distress.    Appearance: She is well-developed. She is not diaphoretic.  HENT:     Head: Normocephalic and atraumatic.  Eyes:     General: No scleral icterus.    Conjunctiva/sclera: Conjunctivae normal.  Neck:     Musculoskeletal: Normal range of motion.  Pulmonary:     Effort: Pulmonary effort is normal. No respiratory distress.  Skin:    Findings: No rash.  Neurological:     Mental Status: She is alert.     ED Course/Procedures   Clinical Course as of Mar 14 1851  Tue Mar 13, 2018  1015 States her pain is well controlled.   [SJ]  69 Spoke to neurologist, Dr. Lorraine Lax.  He does not feel that steroids are indicated at this time based on her MRI.  He will talk to Dr. Cornelius Moras and call me back.   [HK]    Clinical Course User Index [HK] Delia Heady, PA-C [SJ] Layla Maw    Procedures  Mr Jeri Cos And Wo Contrast  Result Date: 03/13/2018 CLINICAL DATA:  Left flank pain and tingling. Personal history of multiple sclerosis. One week history of tingling in the left lower extremity with progressive weakness and spasticity. EXAM: MRI HEAD WITHOUT AND WITH CONTRAST MRI CERVICAL SPINE WITHOUT AND WITH CONTRAST MRI THORACIC  SPINE WITHOUT AND WITH CONTRAST TECHNIQUE: Multiplanar, multiecho pulse sequences of the brain and surrounding structures, and cervical spine, to include the craniocervical junction and cervicothoracic junction, and the , were obtained without and with intravenous contrast. CONTRAST:  10 mL Gadavist COMPARISON:  None. FINDINGS: MRI HEAD FINDINGS Brain: The study is moderately degraded by patient motion. Periventricular white matter changes bilaterally are  stable. No new lesions are present. There is no enhancement or restricted diffusion. No acute infarct, hemorrhage, or mass lesion is present. The ventricles are of normal size. No significant extraaxial fluid collection is present. Postcontrast images demonstrate no pathologic enhancement. Vascular: Flow is present in the major intracranial arteries. Skull and upper cervical spine: The craniocervical junction is normal. Upper cervical spine is within normal limits. Marrow signal is unremarkable. Sinuses/Orbits: The paranasal sinuses and mastoid air cells are clear. The globes and orbits are within normal limits. MRI CERVICAL SPINE FINDINGS Alignment: AP alignment is anatomic. There is no significant scoliosis. Vertebrae: Normal signal is present. Vertebral body heights alignment are normal. No pathologic enhancement is present. Cord: Normal signal is present in the cervical and upper thoracic spinal cord to the lowest imaged level, T2-3. No pathologic enhancement is present Posterior Fossa, vertebral arteries, paraspinal tissues: Craniocervical junction is normal. Flow is present in the major intracranial arteries. Disc levels: No significant disc disease or stenosis is present within the cervical spine. MRI THORACIC SPINE FINDINGS Alignment: AP alignment is anatomic. Vertebrae: Marrow signal and vertebral body heights are normal. No pathologic enhancement is present. Cord: Normal signal is present throughout the spinal cord to the conus medullaris which terminates at L1-2. Paraspinal tissues: Left-sided hydronephrosis is again seen. Limited imaging of the abdomen is otherwise unremarkable. Disc levels: No significant focal  disc intrusion or stenosis is present. Foramina are patent bilaterally. IMPRESSION: 1. Stable periventricular white matter changes compatible with the given diagnosis of multiple sclerosis. No evidence for disease progression. 2. No acute intracranial abnormality. 3. No significant demyelinating  disease within the cervical or thoracic spine. 4. Significant disc disease in the cervical or thoracic spine. 5. Chronic left-sided hydronephrosis. Electronically Signed   By: San Morelle M.D.   On: 03/13/2018 15:57   Mr Cervical Spine W Or Wo Contrast  Result Date: 03/13/2018 CLINICAL DATA:  Left flank pain and tingling. Personal history of multiple sclerosis. One week history of tingling in the left lower extremity with progressive weakness and spasticity. EXAM: MRI HEAD WITHOUT AND WITH CONTRAST MRI CERVICAL SPINE WITHOUT AND WITH CONTRAST MRI THORACIC  SPINE WITHOUT AND WITH CONTRAST TECHNIQUE: Multiplanar, multiecho pulse sequences of the brain and surrounding structures, and cervical spine, to include the craniocervical junction and cervicothoracic junction, and the , were obtained without and with intravenous contrast. CONTRAST:  10 mL Gadavist COMPARISON:  None. FINDINGS: MRI HEAD FINDINGS Brain: The study is moderately degraded by patient motion. Periventricular white matter changes bilaterally are stable. No new lesions are present. There is no enhancement or restricted diffusion. No acute infarct, hemorrhage, or mass lesion is present. The ventricles are of normal size. No significant extraaxial fluid collection is present. Postcontrast images demonstrate no pathologic enhancement. Vascular: Flow is present in the major intracranial arteries. Skull and upper cervical spine: The craniocervical junction is normal. Upper cervical spine is within normal limits. Marrow signal is unremarkable. Sinuses/Orbits: The paranasal sinuses and mastoid air cells are clear. The globes and orbits are within normal limits. MRI CERVICAL SPINE FINDINGS Alignment: AP alignment is anatomic. There is no significant scoliosis. Vertebrae: Normal signal is present. Vertebral body heights alignment are normal. No pathologic enhancement is present. Cord: Normal signal is present in the cervical and upper thoracic spinal  cord to the lowest imaged level, T2-3. No pathologic enhancement is present Posterior Fossa, vertebral arteries, paraspinal tissues: Craniocervical junction is normal. Flow is present in the major intracranial arteries. Disc levels: No significant disc disease or stenosis is present within the cervical spine. MRI THORACIC SPINE FINDINGS Alignment: AP alignment is anatomic. Vertebrae: Marrow signal and vertebral body heights are normal. No pathologic enhancement is present. Cord: Normal signal is present throughout the spinal cord to the conus medullaris which terminates at L1-2. Paraspinal tissues: Left-sided hydronephrosis is again seen. Limited imaging of the abdomen is otherwise unremarkable. Disc levels: No significant focal disc intrusion or stenosis is present. Foramina are patent bilaterally. IMPRESSION: 1. Stable periventricular white matter changes compatible with the given diagnosis of multiple sclerosis. No evidence for disease progression. 2. No acute intracranial abnormality. 3. No significant demyelinating disease within the cervical or thoracic spine. 4. Significant disc disease in the cervical or thoracic spine. 5. Chronic left-sided hydronephrosis. Electronically Signed   By: San Morelle M.D.   On: 03/13/2018 15:57   Mr Thoracic Spine W Wo Contrast  Result Date: 03/13/2018 CLINICAL DATA:  Left flank pain and tingling. Personal history of multiple sclerosis. One week history of tingling in the left lower extremity with progressive weakness and spasticity. EXAM: MRI HEAD WITHOUT AND WITH CONTRAST MRI CERVICAL SPINE WITHOUT AND WITH CONTRAST MRI THORACIC  SPINE WITHOUT AND WITH CONTRAST TECHNIQUE: Multiplanar, multiecho pulse sequences of the brain and surrounding structures, and cervical spine, to include the craniocervical junction and cervicothoracic junction, and the , were obtained without and with intravenous  contrast. CONTRAST:  10 mL Gadavist COMPARISON:  None. FINDINGS: MRI HEAD  FINDINGS Brain: The study is moderately degraded by patient motion. Periventricular white matter changes bilaterally are stable. No new lesions are present. There is no enhancement or restricted diffusion. No acute infarct, hemorrhage, or mass lesion is present. The ventricles are of normal size. No significant extraaxial fluid collection is present. Postcontrast images demonstrate no pathologic enhancement. Vascular: Flow is present in the major intracranial arteries. Skull and upper cervical spine: The craniocervical junction is normal. Upper cervical spine is within normal limits. Marrow signal is unremarkable. Sinuses/Orbits: The paranasal sinuses and mastoid air cells are clear. The globes and orbits are within normal limits. MRI CERVICAL SPINE FINDINGS Alignment: AP alignment is anatomic. There is no significant scoliosis. Vertebrae: Normal signal is present. Vertebral body heights alignment are normal. No pathologic enhancement is present. Cord: Normal signal is present in the cervical and upper thoracic spinal cord to the lowest imaged level, T2-3. No pathologic enhancement is present Posterior Fossa, vertebral arteries, paraspinal tissues: Craniocervical junction is normal. Flow is present in the major intracranial arteries. Disc levels: No significant disc disease or stenosis is present within the cervical spine. MRI THORACIC SPINE FINDINGS Alignment: AP alignment is anatomic. Vertebrae: Marrow signal and vertebral body heights are normal. No pathologic enhancement is present. Cord: Normal signal is present throughout the spinal cord to the conus medullaris which terminates at L1-2. Paraspinal tissues: Left-sided hydronephrosis is again seen. Limited imaging of the abdomen is otherwise unremarkable. Disc levels: No significant focal disc intrusion or stenosis is present. Foramina are patent bilaterally. IMPRESSION: 1. Stable periventricular white matter changes compatible with the given diagnosis of  multiple sclerosis. No evidence for disease progression. 2. No acute intracranial abnormality. 3. No significant demyelinating disease within the cervical or thoracic spine. 4. Significant disc disease in the cervical or thoracic spine. 5. Chronic left-sided hydronephrosis. Electronically Signed   By: San Morelle M.D.   On: 03/13/2018 15:57   Mm 3d Screen Breast Bilateral  Result Date: 03/06/2018 CLINICAL DATA:  Screening. This is the patient's initial baseline mammogram. EXAM: DIGITAL SCREENING BILATERAL MAMMOGRAM WITH TOMO AND CAD COMPARISON:  None. ACR Breast Density Category b: There are scattered areas of fibroglandular density. FINDINGS: There are no findings suspicious for malignancy. Images were processed with CAD. IMPRESSION: No mammographic evidence of malignancy. A result letter of this screening mammogram will be mailed directly to the patient. RECOMMENDATION: Screening mammogram in one year. (Code:SM-B-01Y) BI-RADS CATEGORY  1: Negative. Electronically Signed   By: Evangeline Dakin M.D.   On: 03/06/2018 12:31    MDM  Care handed off from previous provider, PA Joy, who received handoff from [place].  Please see their note for further detail.  Briefly, patient with a history of MS treated daily with Tecfidera, presents to ED for 1 week history of left lower extremity paresthesias, weakness and spasticity consistent with her prior MS exacerbations.  Denies any fever.  She reports falls secondary to weakness.  Plan is to obtain MRIs, consult neurology on updates.  6:53 PM Spoke to Dr. Cornelius Moras after he evaluated the patient.  He spoke to patient's neurologist, Dr. Ladonna Snide who states that patient did not have any convincing lesions in the past for MS however did respond to IV steroid therapy.  He recommends 3 days of IV Solu-Medrol which he will order today and asked that we admit to hospitalist.  Will also need PT and OT while admitted.  Will call hospitalist  for  admission.    Portions of this note were generated with [employer]. Dictation errors may occur despite best attempts at proofreading.    Delia Heady, PA-C 03/13/18 1855    Tegeler, Gwenyth Allegra, MD 03/13/18 [phone]

## 2018-03-13 NOTE — ED Notes (Signed)
Patient transported to CT 

## 2018-03-13 NOTE — ED Notes (Signed)
Patient transported to MRI 

## 2018-03-13 NOTE — ED Provider Notes (Signed)
Dawn Peters is a 38 y.o. female, presenting to the ED with left leg tingling, progressive weakness, and spasticity.      HPI from Charlann Lange, PA-C: "Dawn Peters is a 38 y.o. female.  Patient with a history of MS, follow by Dr. Felecia Shelling (Neurology) and treated with daily Tecfidera, presents with one week of left LE tingling, progressive weakness and spasticity c/w MS exacerbation. No fever, nausea, vomiting, cough, congestion or urinary symptoms. She reports falls in the past week secondary to left leg symptoms but denies injury.   The history is provided by the patient. No language interpreter was used."  Past Medical History:  Diagnosis Date  . Anticoagulant long-term use    Eliquis  . Anxiety   . Chronic pain   . CKD (chronic kidney disease), stage III ([place])   . Depression   . 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 Gait disturbance    due to MS  . GERD (gastroesophageal reflux disease)    watch diet  . 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 History of DVT of lower extremity 2007  . History of encephalopathy 09/3708   acute metabolic encephalopathy secondary to MS,  resolved  . History of MRSA infection 2004   right hip infection post THA  . History of pulmonary embolus (PE) 2005  . Hydronephrosis, left    w/ acute kidney injury 02/ 2019  . Hypertension   . Left-sided weakness    due to MS  . Migraines   . MS (multiple sclerosis) [facility]) neurologist-  dr Renne Musca   dx 2000--  . Muscle spasticity   . Neurogenic bladder    due to MS  . Pulmonary embolism, bilateral ([place]) 09/17/2017   treated w/ eliquis  . Strains to urinate   . Urgency of urination     Physical Exam  BP 121/84 (BP Location: Right Arm)   Pulse 79   Temp 98.3 F (36.8 C) (Oral)   Resp 18   Ht 5\' 6"  (1.676 m)   Wt 99.2 kg   LMP 03/13/2018    SpO2 100%   BMI 35.28 kg/m   Physical Exam Vitals signs and nursing note reviewed.  Constitutional:      General: She is not in acute distress.    Appearance: She is well-developed. She is not diaphoretic.  HENT:     Head: Normocephalic and atraumatic.     Mouth/Throat:     Mouth: Mucous membranes are moist.     Pharynx: Oropharynx is clear.  Eyes:     Conjunctiva/sclera: Conjunctivae normal.  Neck:     Musculoskeletal: Neck supple.  Cardiovascular:     Rate and Rhythm: Normal rate and regular rhythm.     Pulses: Normal pulses.     Heart sounds: Normal heart sounds.  Pulmonary:     Effort: Pulmonary effort is normal. No respiratory distress.     Breath sounds: Normal breath sounds.  Abdominal:     Palpations: Abdomen is soft.     Tenderness: There is no abdominal tenderness. There is no guarding.  Musculoskeletal:     Right lower leg: No edema.     Left lower leg: No edema.  Lymphadenopathy:     Cervical: No cervical adenopathy.  Skin:    General: Skin is warm and dry.  Neurological:  Mental Status: She is alert.  Psychiatric:        Behavior: Behavior normal.     ED Course/Procedures     Procedures   Abnormal Labs Reviewed  CBC WITH DIFFERENTIAL/PLATELET - Abnormal; Notable for the following components:      Result Value   RBC 3.34 (*)    Hemoglobin 11.4 (*)    HCT 35.8 (*)    MCV 107.2 (*)    MCH 34.1 (*)    All other components within normal limits  BASIC METABOLIC PANEL - Abnormal; Notable for the following components:   BUN 21 (*)    Creatinine, Ser 1.43 (*)    GFR calc non Af Amer 46 (*)    GFR calc Af Amer 53 (*)    All other components within normal limits  URINALYSIS, ROUTINE W REFLEX MICROSCOPIC - Abnormal; Notable for the following components:   APPearance HAZY (*)    Hgb urine dipstick LARGE (*)    Ketones, ur 5 (*)    All other components within normal limits   Mr Jeri Cos And Wo Contrast  Result Date: 03/13/2018 CLINICAL DATA:   Left flank pain and tingling. Personal history of multiple sclerosis. One week history of tingling in the left lower extremity with progressive weakness and spasticity. EXAM: MRI HEAD WITHOUT AND WITH CONTRAST MRI CERVICAL SPINE WITHOUT AND WITH CONTRAST MRI THORACIC  SPINE WITHOUT AND WITH CONTRAST TECHNIQUE: Multiplanar, multiecho pulse sequences of the brain and surrounding structures, and cervical spine, to include the craniocervical junction and cervicothoracic junction, and the , were obtained without and with intravenous contrast. CONTRAST:  10 mL Gadavist COMPARISON:  None. FINDINGS: MRI HEAD FINDINGS Brain: The study is moderately degraded by patient motion. Periventricular white matter changes bilaterally are stable. No new lesions are present. There is no enhancement or restricted diffusion. No acute infarct, hemorrhage, or mass lesion is present. The ventricles are of normal size. No significant extraaxial fluid collection is present. Postcontrast images demonstrate no pathologic enhancement. Vascular: Flow is present in the major intracranial arteries. Skull and upper cervical spine: The craniocervical junction is normal. Upper cervical spine is within normal limits. Marrow signal is unremarkable. Sinuses/Orbits: The paranasal sinuses and mastoid air cells are clear. The globes and orbits are within normal limits. MRI CERVICAL SPINE FINDINGS Alignment: AP alignment is anatomic. There is no significant scoliosis. Vertebrae: Normal signal is present. Vertebral body heights alignment are normal. No pathologic enhancement is present. Cord: Normal signal is present in the cervical and upper thoracic spinal cord to the lowest imaged level, T2-3. No pathologic enhancement is present Posterior Fossa, vertebral arteries, paraspinal tissues: Craniocervical junction is normal. Flow is present in the major intracranial arteries. Disc levels: No significant disc disease or stenosis is present within the cervical  spine. MRI THORACIC SPINE FINDINGS Alignment: AP alignment is anatomic. Vertebrae: Marrow signal and vertebral body heights are normal. No pathologic enhancement is present. Cord: Normal signal is present throughout the spinal cord to the conus medullaris which terminates at L1-2. Paraspinal tissues: Left-sided hydronephrosis is again seen. Limited imaging of the abdomen is otherwise unremarkable. Disc levels: No significant focal disc intrusion or stenosis is present. Foramina are patent bilaterally. IMPRESSION: 1. Stable periventricular white matter changes compatible with the given diagnosis of multiple sclerosis. No evidence for disease progression. 2. No acute intracranial abnormality. 3. No significant demyelinating disease within the cervical or thoracic spine. 4. Significant disc disease in the cervical or thoracic spine. 5. Chronic left-sided hydronephrosis.  Electronically Signed   By: San Morelle M.D.   On: 03/13/2018 15:57   Mr Cervical Spine W Or Wo Contrast  Result Date: 03/13/2018 CLINICAL DATA:  Left flank pain and tingling. Personal history of multiple sclerosis. One week history of tingling in the left lower extremity with progressive weakness and spasticity. EXAM: MRI HEAD WITHOUT AND WITH CONTRAST MRI CERVICAL SPINE WITHOUT AND WITH CONTRAST MRI THORACIC  SPINE WITHOUT AND WITH CONTRAST TECHNIQUE: Multiplanar, multiecho pulse sequences of the brain and surrounding structures, and cervical spine, to include the craniocervical junction and cervicothoracic junction, and the , were obtained without and with intravenous contrast. CONTRAST:  10 mL Gadavist COMPARISON:  None. FINDINGS: MRI HEAD FINDINGS Brain: The study is moderately degraded by patient motion. Periventricular white matter changes bilaterally are stable. No new lesions are present. There is no enhancement or restricted diffusion. No acute infarct, hemorrhage, or mass lesion is present. The ventricles are of normal size. No  significant extraaxial fluid collection is present. Postcontrast images demonstrate no pathologic enhancement. Vascular: Flow is present in the major intracranial arteries. Skull and upper cervical spine: The craniocervical junction is normal. Upper cervical spine is within normal limits. Marrow signal is unremarkable. Sinuses/Orbits: The paranasal sinuses and mastoid air cells are clear. The globes and orbits are within normal limits. MRI CERVICAL SPINE FINDINGS Alignment: AP alignment is anatomic. There is no significant scoliosis. Vertebrae: Normal signal is present. Vertebral body heights alignment are normal. No pathologic enhancement is present. Cord: Normal signal is present in the cervical and upper thoracic spinal cord to the lowest imaged level, T2-3. No pathologic enhancement is present Posterior Fossa, vertebral arteries, paraspinal tissues: Craniocervical junction is normal. Flow is present in the major intracranial arteries. Disc levels: No significant disc disease or stenosis is present within the cervical spine. MRI THORACIC SPINE FINDINGS Alignment: AP alignment is anatomic. Vertebrae: Marrow signal and vertebral body heights are normal. No pathologic enhancement is present. Cord: Normal signal is present throughout the spinal cord to the conus medullaris which terminates at L1-2. Paraspinal tissues: Left-sided hydronephrosis is again seen. Limited imaging of the abdomen is otherwise unremarkable. Disc levels: No significant focal disc intrusion or stenosis is present. Foramina are patent bilaterally. IMPRESSION: 1. Stable periventricular white matter changes compatible with the given diagnosis of multiple sclerosis. No evidence for disease progression. 2. No acute intracranial abnormality. 3. No significant demyelinating disease within the cervical or thoracic spine. 4. Significant disc disease in the cervical or thoracic spine. 5. Chronic left-sided hydronephrosis. Electronically Signed   By:  San Morelle M.D.   On: 03/13/2018 15:57   Mr Thoracic Spine W Wo Contrast  Result Date: 03/13/2018 CLINICAL DATA:  Left flank pain and tingling. Personal history of multiple sclerosis. One week history of tingling in the left lower extremity with progressive weakness and spasticity. EXAM: MRI HEAD WITHOUT AND WITH CONTRAST MRI CERVICAL SPINE WITHOUT AND WITH CONTRAST MRI THORACIC  SPINE WITHOUT AND WITH CONTRAST TECHNIQUE: Multiplanar, multiecho pulse sequences of the brain and surrounding structures, and cervical spine, to include the craniocervical junction and cervicothoracic junction, and the , were obtained without and with intravenous contrast. CONTRAST:  10 mL Gadavist COMPARISON:  None. FINDINGS: MRI HEAD FINDINGS Brain: The study is moderately degraded by patient motion. Periventricular white matter changes bilaterally are stable. No new lesions are present. There is no enhancement or restricted diffusion. No acute infarct, hemorrhage, or mass lesion is present. The ventricles are of normal size. No significant extraaxial fluid  collection is present. Postcontrast images demonstrate no pathologic enhancement. Vascular: Flow is present in the major intracranial arteries. Skull and upper cervical spine: The craniocervical junction is normal. Upper cervical spine is within normal limits. Marrow signal is unremarkable. Sinuses/Orbits: The paranasal sinuses and mastoid air cells are clear. The globes and orbits are within normal limits. MRI CERVICAL SPINE FINDINGS Alignment: AP alignment is anatomic. There is no significant scoliosis. Vertebrae: Normal signal is present. Vertebral body heights alignment are normal. No pathologic enhancement is present. Cord: Normal signal is present in the cervical and upper thoracic spinal cord to the lowest imaged level, T2-3. No pathologic enhancement is present Posterior Fossa, vertebral arteries, paraspinal tissues: Craniocervical junction is normal. Flow is  present in the major intracranial arteries. Disc levels: No significant disc disease or stenosis is present within the cervical spine. MRI THORACIC SPINE FINDINGS Alignment: AP alignment is anatomic. Vertebrae: Marrow signal and vertebral body heights are normal. No pathologic enhancement is present. Cord: Normal signal is present throughout the spinal cord to the conus medullaris which terminates at L1-2. Paraspinal tissues: Left-sided hydronephrosis is again seen. Limited imaging of the abdomen is otherwise unremarkable. Disc levels: No significant focal disc intrusion or stenosis is present. Foramina are patent bilaterally. IMPRESSION: 1. Stable periventricular white matter changes compatible with the given diagnosis of multiple sclerosis. No evidence for disease progression. 2. No acute intracranial abnormality. 3. No significant demyelinating disease within the cervical or thoracic spine. 4. Significant disc disease in the cervical or thoracic spine. 5. Chronic left-sided hydronephrosis. Electronically Signed   By: San Morelle M.D.   On: 03/13/2018 15:57   Mm 3d Screen Breast Bilateral  Result Date: 03/06/2018 CLINICAL DATA:  Screening. This is the patient's initial baseline mammogram. EXAM: DIGITAL SCREENING BILATERAL MAMMOGRAM WITH TOMO AND CAD COMPARISON:  None. ACR Breast Density Category b: There are scattered areas of fibroglandular density. FINDINGS: There are no findings suspicious for malignancy. Images were processed with CAD. IMPRESSION: No mammographic evidence of malignancy. A result letter of this screening mammogram will be mailed directly to the patient. RECOMMENDATION: Screening mammogram in one year. (Code:SM-B-01Y) BI-RADS CATEGORY  1: Negative. Electronically Signed   By: Evangeline Dakin M.D.   On: 03/06/2018 12:31   MDM   Clinical Course as of Mar 13 1616  Tue Mar 13, 2018  1015 States her pain is well controlled.   [SJ]    Clinical Course User Index [SJ] Lorayne Bender, PA-C   Patient care handoff report received from Charlann Lange, [place]. Plan: Patient to have MRI performed. Dr. Leonel Ramsay recommends patient receive evaluation by neurology following MRI results.   Patient seemed to improve with steroid infusion.  MRI [facility] at [facility] was nonfunctional for a large part of the day.  We discussed transferring the patient to [facility], but patient declined.  End of shift patient care handoff report given to [facility], PA-C. Plan: Neurology to examine patient and make a recommendation.  Disposition per their recommendation.   Vitals:   03/13/18 1107 03/13/18 1112 03/13/18 1300 03/13/18 1614  BP: (!) 114/98  132/90 122/87  Pulse: 85 85 95 (!) 102  Resp: 18  17 17   Temp:      TempSrc:      SpO2: 97%  97% 100%  Weight:      Height:           Lorayne Bender, PA-C 03/13/18 1622    Hayden Rasmussen, MD 03/17/18 1047

## 2018-03-13 NOTE — ED Notes (Signed)
Durenda Age called, states the pt's daughter tested positive for strep throat and would like the pt to be tested as well.

## 2018-03-13 NOTE — Consult Note (Signed)
Neurology Consultation Note  Consult Requested by: Charlann Lange, PA  Reason for Consult: MS flare  Consult Date: 03/13/18  The history was obtained from the pt.  During history and examination, all items were able to obtain unless otherwise noted.  History of Present Illness:  Dawn Peters is a 38 y.o. African American female with PMH of MS, HTN, DVT and PE, migraine, anxiety and depression presented for worsening left leg weakness, lower back pain with left lower back burning sensation and left leg mild numbness for the last 2 days. She recently had some worsening of left leg weakness and received one dose of 1g IV solumedrol at [place] with Dr. Felecia Shelling had some improvement but relapsed this time as per pt. She felt 3 consecutive dose of solumedrol helped better in the past. For the last two days, she could not walk with walker anymore and lower pack pain and burning sensation triggered her here for further evaluation.    She follows with Dr. Felecia Shelling at [facility]. As per Dr. Garth Bigness note "She was diagnosed with MS in 2000 after presenting with gait difficulties, numbness and headaches. An MRI of the brain was consistent with MS. She was then started on Betaseron. She had difficulty tolerating Betaseron and at some point switched to Novantrone. She took Novantrone every 3 months for a year or so. A little later, she was placed on Tysabri and she stayed on it for a couple of years. However, she was JCV-positive and switched off. She did well on Tysabri. She had been on Tecfidera since 2015. Her MRI from June 2015 showed that there had been no changes compared to an MRI from 2014.   However, she had an exacerbation in March 2017.   The 08/07/2013 MRI of the brain shows multiple T2/FLAIR hyperintense foci located in the cerebellum, middle cerebellar peduncles, pons and in the periventricular white matter of the hemispheres in a pattern and configuration consistent with chronic demyelinating plaque  associated with MS. There were no acute findings. There was no change compared to the previous MRI from 01/16/2013 that was also reviewed. An MRI of the cervical spine shows subtle T2 hyperintense signal within the cord and there were no acute findings". Since she followed with Dr. Felecia Shelling, she had two cycles of Lemtrada in 2017 and 2018 and tolerated well and then continued on Tecfidera. However, she continued to have progressive left leg weakness and spasticity and gait difficulty. Diagnosed with SPMS. Admitted in 05/2017 and received IV solumedrol but did not feel any better. She was sent to SNF for rehab at that time. She had left DVT and PE in 09/2017 and was put on eliquis. She also has elevated Cre at 1.3-1.4. she last saw Dr. Felecia Shelling was in 10/2017, still has left leg weakness and spasticity, poor balance, use cane for walking, on ampyra, baclofen, tizanidien, lyrica and trileptal, still on eliquis.     Past Medical History:  Diagnosis Date  . Anticoagulant long-term use    Eliquis  . Anxiety   . Chronic pain   . CKD (chronic kidney disease), stage III ([place])   . Depression   . 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 Gait disturbance    due to MS  . GERD (gastroesophageal reflux disease)    watch diet  . 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 History  of DVT of lower extremity 2007  . History of encephalopathy 56/8127   acute metabolic encephalopathy secondary to MS,  resolved  . History of MRSA infection 2004   right hip infection post THA  . History of pulmonary embolus (PE) 2005  . Hydronephrosis, left    w/ acute kidney injury 02/ 2019  . Hypertension   . Left-sided weakness    due to MS  . Migraines   . MS (multiple sclerosis) [facility] neurologist-  dr Renne Musca   dx 2000--  . Muscle spasticity   . Neurogenic bladder    due to MS  . Pulmonary embolism,  bilateral ([place]) 09/17/2017   treated w/ eliquis  . Strains to urinate   . Urgency of urination     Past Surgical History:  Procedure Laterality Date  . CYSTOSCOPY WITH RETROGRADE PYELOGRAM, URETEROSCOPY AND STENT PLACEMENT Bilateral 11/29/2017   Procedure: BILATERAL  RETROGRADE PYELOGRAM, LEFT DIAGNOSIC URETEROSCOPY AND STENT LEFT PLACEMENT;  Surgeon: Ardis Hughs, MD;  Location: [facility];  Service: Urology;  Laterality: Bilateral;  . HIP ARTHROSCOPY Left 07-05-2013   dr pill @[facility]    iliopsosas release, synovectomy  . REVISION TOTAL HIP ARTHROPLASTY Right 03-28-2003    dr Alvan Dame @[facility]   . TOTAL HIP ARTHROPLASTY Bilateral left 11-05-2002  dr Alvan Dame @[facility] ;   right 06/ 2004  @[facility]     Family History  Problem Relation Age of Onset  . Healthy Mother   . Healthy Father   . Breast cancer Other        paternal grandmother dx age 5  . Colon cancer Other        paternal grandmother  . Hypertension Other   . Diabetes Other     Social History:  reports that she has never smoked. She has never used smokeless tobacco. She reports that she does not drink alcohol or use drugs.  Allergies:  Allergies  Allergen Reactions  . Ace Inhibitors Swelling  . Amoxicillin Itching  . Lisinopril Swelling    Lower lip swelling    No current facility-administered medications on file prior to encounter.    Current Outpatient Medications on File Prior to Encounter  Medication Sig Dispense Refill  . amantadine (SYMMETREL) 100 MG capsule Take 100 mg by mouth 2 (two) times daily.  2  . amitriptyline (ELAVIL) 25 MG tablet TAKE 1-2 TABLETS BY MOUTH AT BEDTIME 180 tablet 0  . amLODipine (NORVASC) 5 MG tablet Take 1 tablet (5 mg total) by mouth daily. (Patient taking differently: Take 5 mg by mouth every morning. ) 90 tablet 0  . amphetamine-dextroamphetamine (ADDERALL XR) 20 MG 24 hr capsule Take 1 capsule (20 mg total) by mouth daily. 30 capsule 0  . apixaban (ELIQUIS) 5 MG TABS tablet  Take 1 tablet (5 mg total) by mouth 2 (two) times daily. 60 tablet 0  . baclofen (LIORESAL) 20 MG tablet Take 1 tablet (20 mg total) by mouth 3 (three) times daily as needed (for ms). (Patient taking differently: Take 20 mg by mouth 4 (four) times daily. ) 270 each 3  . busPIRone (BUSPAR) 15 MG tablet Take 1 tablet (15 mg total) by mouth 2 (two) times daily. 60 tablet 11  . citalopram (CELEXA) 20 MG tablet Take 20 mg by mouth every morning.   2  . dalfampridine 10 MG TB12 Take 1 tablet (10 mg total) by mouth 2 (two) times daily. (Patient taking differently: Take 10 mg by mouth 2 (two) times daily. ) 180 tablet 3  .  Dimethyl Fumarate (TECFIDERA) 240 MG CPDR Take 240 mg by mouth 2 (two) times daily.     Marland Kitchen gabapentin (NEURONTIN) 800 MG tablet Take 1 tablet (800 mg total) by mouth 3 (three) times daily. 90 tablet 5  . hydrOXYzine (ATARAX/VISTARIL) 10 MG tablet Take 10 mg by mouth 3 (three) times daily as needed for anxiety.  2  . lamoTRIgine (LAMICTAL) 200 MG tablet Take 1 tablet (200 mg total) by mouth 2 (two) times daily. 60 tablet 11  . metoprolol tartrate (LOPRESSOR) 25 MG tablet Take 0.5 tablets (12.5 mg total) by mouth 2 (two) times daily. 30 tablet 0  . Multiple Vitamin (MULTIVITAMIN WITH MINERALS) TABS tablet Take 1 tablet by mouth daily. 30 tablet 0  . Oxcarbazepine (TRILEPTAL) 300 MG tablet Take 1 tablet (300 mg total) by mouth 2 (two) times daily. 60 tablet 1  . oxyCODONE (OXY IR/ROXICODONE) 5 MG immediate release tablet Take 5 mg by mouth every 4 (four) hours as needed for severe pain.    . pregabalin (LYRICA) 225 MG capsule Take 1 capsule (225 mg total) by mouth 2 (two) times daily. 60 capsule 5  . SUMAtriptan (IMITREX) 100 MG tablet Take 1 tablet (100 mg total) by mouth once as needed for migraine. May repeat in 2 hours if headache persists or recurs. 10 tablet 3  . tamsulosin (FLOMAX) 0.4 MG CAPS capsule Take 1 capsule (0.4 mg total) by mouth daily. (Patient taking differently: Take 0.4 mg  by mouth daily after breakfast. ) 30 capsule 11  . tiZANidine (ZANAFLEX) 4 MG tablet TAKE 1 TABLET BY MOUTH AT BEDTIME 30 tablet 0  . valACYclovir (VALTREX) 500 MG tablet Take 1 tablet (500 mg total) by mouth 2 (two) times daily. 60 tablet 2  . methylPREDNISolone (MEDROL) 4 MG tablet Take as directed. (Patient not taking: Reported on 03/13/2018) 21 tablet 0  . phenazopyridine (PYRIDIUM) 200 MG tablet Take 1 tablet (200 mg total) by mouth 3 (three) times daily as needed for pain. (Patient not taking: Reported on 03/13/2018) 10 tablet 0  . traMADol (ULTRAM-ER) 300 MG 24 hr tablet Take 1 tablet (300 mg total) by mouth daily. (Patient not taking: Reported on 03/13/2018) 30 tablet 0    Review of Systems: A full ROS was attempted today and was able to be performed.  Systems assessed include - Constitutional, Eyes, HENT, Respiratory, Cardiovascular, Gastrointestinal, Genitourinary, Integument/breast, Hematologic/lymphatic, Musculoskeletal, Neurological, Behavioral/Psych, Endocrine, Allergic/Immunologic - with pertinent responses as per HPI.  Physical Examination: Temp:  [98.3 F (36.8 C)] 98.3 F (36.8 C) (01/07 0009) Pulse Rate:  [79-103] 103 (01/07 1853) Resp:  [16-18] 17 (01/07 1853) BP: (114-136)/(82-98) 120/82 (01/07 1853) SpO2:  [93 %-100 %] 94 % (01/07 1853) Weight:  [99.2 kg] 99.2 kg (01/07 0009)  General - well nourished, well developed, in no apparent distress.    Ophthalmologic - fundi not visualized due to noncooperation.    Cardiovascular - regular rate and rhythm  Mental Status -  Level of arousal and orientation to time, place, and person were intact. Language including expression, naming, repetition, comprehension, reading, and writing was assessed and found intact. Fund of Knowledge was assessed and was intact.  Cranial Nerves II - XII - II - Vision intact OU. III, IV, VI - Extraocular movements intact. V - Facial sensation intact bilaterally. VII - Facial movement intact  bilaterally. VIII - Hearing & vestibular intact bilaterally. X - Palate elevates symmetrically. XI - Chin turning & shoulder shrug intact bilaterally. XII - Tongue protrusion intact.  Motor Strength - The patient's strength was normal in RUE and RLE and LUE. However, LLE proximal 3-/5 and DF 3/5 and PF 4+/5 and pronator drift was absent.   Motor Tone & Bulk - Muscle tone was assessed at the neck and appendages and was mildly increased at LLE.  Bulk was normal and fasciculations were absent.   Reflexes - The patient's reflexes were 1+ BUE and 2+ RLE and 3+ LLE and she had no babinski but left sustained ankle clonus.  Sensory - Light touch, temperature/pinprick were assessed and were normal.    Coordination - The patient had normal movements in the hands with no ataxia or dysmetria.  Tremor was absent.  Gait and Station - deferred  Data Reviewed: Mr Jeri Cos And Wo Contrast  Result Date: 03/13/2018 CLINICAL DATA:  Left flank pain and tingling. Personal history of multiple sclerosis. One week history of tingling in the left lower extremity with progressive weakness and spasticity. EXAM: MRI HEAD WITHOUT AND WITH CONTRAST MRI CERVICAL SPINE WITHOUT AND WITH CONTRAST MRI THORACIC  SPINE WITHOUT AND WITH CONTRAST TECHNIQUE: Multiplanar, multiecho pulse sequences of the brain and surrounding structures, and cervical spine, to include the craniocervical junction and cervicothoracic junction, and the , were obtained without and with intravenous contrast. CONTRAST:  10 mL Gadavist COMPARISON:  None. FINDINGS: MRI HEAD FINDINGS Brain: The study is moderately degraded by patient motion. Periventricular white matter changes bilaterally are stable. No new lesions are present. There is no enhancement or restricted diffusion. No acute infarct, hemorrhage, or mass lesion is present. The ventricles are of normal size. No significant extraaxial fluid collection is present. Postcontrast images demonstrate no  pathologic enhancement. Vascular: Flow is present in the major intracranial arteries. Skull and upper cervical spine: The craniocervical junction is normal. Upper cervical spine is within normal limits. Marrow signal is unremarkable. Sinuses/Orbits: The paranasal sinuses and mastoid air cells are clear. The globes and orbits are within normal limits. MRI CERVICAL SPINE FINDINGS Alignment: AP alignment is anatomic. There is no significant scoliosis. Vertebrae: Normal signal is present. Vertebral body heights alignment are normal. No pathologic enhancement is present. Cord: Normal signal is present in the cervical and upper thoracic spinal cord to the lowest imaged level, T2-3. No pathologic enhancement is present Posterior Fossa, vertebral arteries, paraspinal tissues: Craniocervical junction is normal. Flow is present in the major intracranial arteries. Disc levels: No significant disc disease or stenosis is present within the cervical spine. MRI THORACIC SPINE FINDINGS Alignment: AP alignment is anatomic. Vertebrae: Marrow signal and vertebral body heights are normal. No pathologic enhancement is present. Cord: Normal signal is present throughout the spinal cord to the conus medullaris which terminates at L1-2. Paraspinal tissues: Left-sided hydronephrosis is again seen. Limited imaging of the abdomen is otherwise unremarkable. Disc levels: No significant focal disc intrusion or stenosis is present. Foramina are patent bilaterally. IMPRESSION: 1. Stable periventricular white matter changes compatible with the given diagnosis of multiple sclerosis. No evidence for disease progression. 2. No acute intracranial abnormality. 3. No significant demyelinating disease within the cervical or thoracic spine. 4. Significant disc disease in the cervical or thoracic spine. 5. Chronic left-sided hydronephrosis. Electronically Signed   By: San Morelle M.D.   On: 03/13/2018 15:57   Mr Cervical Spine W Or Wo  Contrast  Result Date: 03/13/2018 CLINICAL DATA:  Left flank pain and tingling. Personal history of multiple sclerosis. One week history of tingling in the left lower extremity with progressive weakness and spasticity. EXAM: MRI  HEAD WITHOUT AND WITH CONTRAST MRI CERVICAL SPINE WITHOUT AND WITH CONTRAST MRI THORACIC  SPINE WITHOUT AND WITH CONTRAST TECHNIQUE: Multiplanar, multiecho pulse sequences of the brain and surrounding structures, and cervical spine, to include the craniocervical junction and cervicothoracic junction, and the , were obtained without and with intravenous contrast. CONTRAST:  10 mL Gadavist COMPARISON:  None. FINDINGS: MRI HEAD FINDINGS Brain: The study is moderately degraded by patient motion. Periventricular white matter changes bilaterally are stable. No new lesions are present. There is no enhancement or restricted diffusion. No acute infarct, hemorrhage, or mass lesion is present. The ventricles are of normal size. No significant extraaxial fluid collection is present. Postcontrast images demonstrate no pathologic enhancement. Vascular: Flow is present in the major intracranial arteries. Skull and upper cervical spine: The craniocervical junction is normal. Upper cervical spine is within normal limits. Marrow signal is unremarkable. Sinuses/Orbits: The paranasal sinuses and mastoid air cells are clear. The globes and orbits are within normal limits. MRI CERVICAL SPINE FINDINGS Alignment: AP alignment is anatomic. There is no significant scoliosis. Vertebrae: Normal signal is present. Vertebral body heights alignment are normal. No pathologic enhancement is present. Cord: Normal signal is present in the cervical and upper thoracic spinal cord to the lowest imaged level, T2-3. No pathologic enhancement is present Posterior Fossa, vertebral arteries, paraspinal tissues: Craniocervical junction is normal. Flow is present in the major intracranial arteries. Disc levels: No significant disc  disease or stenosis is present within the cervical spine. MRI THORACIC SPINE FINDINGS Alignment: AP alignment is anatomic. Vertebrae: Marrow signal and vertebral body heights are normal. No pathologic enhancement is present. Cord: Normal signal is present throughout the spinal cord to the conus medullaris which terminates at L1-2. Paraspinal tissues: Left-sided hydronephrosis is again seen. Limited imaging of the abdomen is otherwise unremarkable. Disc levels: No significant focal disc intrusion or stenosis is present. Foramina are patent bilaterally. IMPRESSION: 1. Stable periventricular white matter changes compatible with the given diagnosis of multiple sclerosis. No evidence for disease progression. 2. No acute intracranial abnormality. 3. No significant demyelinating disease within the cervical or thoracic spine. 4. Significant disc disease in the cervical or thoracic spine. 5. Chronic left-sided hydronephrosis. Electronically Signed   By: San Morelle M.D.   On: 03/13/2018 15:57   Mr Thoracic Spine W Wo Contrast  Result Date: 03/13/2018 CLINICAL DATA:  Left flank pain and tingling. Personal history of multiple sclerosis. One week history of tingling in the left lower extremity with progressive weakness and spasticity. EXAM: MRI HEAD WITHOUT AND WITH CONTRAST MRI CERVICAL SPINE WITHOUT AND WITH CONTRAST MRI THORACIC  SPINE WITHOUT AND WITH CONTRAST TECHNIQUE: Multiplanar, multiecho pulse sequences of the brain and surrounding structures, and cervical spine, to include the craniocervical junction and cervicothoracic junction, and the , were obtained without and with intravenous contrast. CONTRAST:  10 mL Gadavist COMPARISON:  None. FINDINGS: MRI HEAD FINDINGS Brain: The study is moderately degraded by patient motion. Periventricular white matter changes bilaterally are stable. No new lesions are present. There is no enhancement or restricted diffusion. No acute infarct, hemorrhage, or mass lesion is  present. The ventricles are of normal size. No significant extraaxial fluid collection is present. Postcontrast images demonstrate no pathologic enhancement. Vascular: Flow is present in the major intracranial arteries. Skull and upper cervical spine: The craniocervical junction is normal. Upper cervical spine is within normal limits. Marrow signal is unremarkable. Sinuses/Orbits: The paranasal sinuses and mastoid air cells are clear. The globes and orbits are within normal limits.  MRI CERVICAL SPINE FINDINGS Alignment: AP alignment is anatomic. There is no significant scoliosis. Vertebrae: Normal signal is present. Vertebral body heights alignment are normal. No pathologic enhancement is present. Cord: Normal signal is present in the cervical and upper thoracic spinal cord to the lowest imaged level, T2-3. No pathologic enhancement is present Posterior Fossa, vertebral arteries, paraspinal tissues: Craniocervical junction is normal. Flow is present in the major intracranial arteries. Disc levels: No significant disc disease or stenosis is present within the cervical spine. MRI THORACIC SPINE FINDINGS Alignment: AP alignment is anatomic. Vertebrae: Marrow signal and vertebral body heights are normal. No pathologic enhancement is present. Cord: Normal signal is present throughout the spinal cord to the conus medullaris which terminates at L1-2. Paraspinal tissues: Left-sided hydronephrosis is again seen. Limited imaging of the abdomen is otherwise unremarkable. Disc levels: No significant focal disc intrusion or stenosis is present. Foramina are patent bilaterally. IMPRESSION: 1. Stable periventricular white matter changes compatible with the given diagnosis of multiple sclerosis. No evidence for disease progression. 2. No acute intracranial abnormality. 3. No significant demyelinating disease within the cervical or thoracic spine. 4. Significant disc disease in the cervical or thoracic spine. 5. Chronic left-sided  hydronephrosis. Electronically Signed   By: San Morelle M.D.   On: 03/13/2018 15:57   Mm 3d Screen Breast Bilateral  Result Date: 03/06/2018 CLINICAL DATA:  Screening. This is the patient's initial baseline mammogram. EXAM: DIGITAL SCREENING BILATERAL MAMMOGRAM WITH TOMO AND CAD COMPARISON:  None. ACR Breast Density Category b: There are scattered areas of fibroglandular density. FINDINGS: There are no findings suspicious for malignancy. Images were processed with CAD. IMPRESSION: No mammographic evidence of malignancy. A result letter of this screening mammogram will be mailed directly to the patient. RECOMMENDATION: Screening mammogram in one year. (Code:SM-B-01Y) BI-RADS CATEGORY  1: Negative. Electronically Signed   By: Evangeline Dakin M.D.   On: 03/06/2018 12:31    Assessment: 38 y.o. female with PMH of MS, HTN, left LE DVT and PE on eliquis, migraine, anxiety and depression presented for worsening left leg weakness, lower back pain with left lower back burning sensation. She has been following with Dr. 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 Had MRI brain, C/T spine with and without contrast, no acute finding. However, given her functional decline, and SPMS patients may not have MRI changes, will favor IV solumedrol 1g x 3 doses. Discussed with Dr. Felecia Shelling, he is in agreement. Pt will be admitted to complete the solumedrol course and PT/OT evaluation.   Plan: - admitted to hospitalist service - solumedrol IV for 2 more doses - PT/OT evaluation - continue current home medications. - will follow  Thank you for this consultation and allowing [place] to participate in the care of this patient.  Rosalin Hawking, MD PhD Neurology 03/13/2018 7:08 PM

## 2018-03-13 NOTE — ED Notes (Signed)
Per Arron in MRI-patient's exam will be done around 1315-states exam is 2 plus hours-PA notified

## 2018-03-13 NOTE — ED Triage Notes (Signed)
Pt states that she's having an MS flare that has been building for three days, she states that she's unable to walk today Pt states that she usually gets steroid injections and sometimes gets admitted when she cant walk

## 2018-03-13 NOTE — ED Notes (Signed)
Bed: WTR8 Expected date:  Expected time:  Means of arrival:  Comments: 

## 2018-03-14 DIAGNOSIS — N183 Chronic kidney disease, stage 3 unspecified: Secondary | ICD-10-CM

## 2018-03-14 DIAGNOSIS — E669 Obesity, unspecified: Secondary | ICD-10-CM

## 2018-03-14 DIAGNOSIS — N1831 Chronic kidney disease, stage 3a: Secondary | ICD-10-CM

## 2018-03-14 DIAGNOSIS — G35 Multiple sclerosis: Secondary | ICD-10-CM | POA: Diagnosis not present

## 2018-03-14 DIAGNOSIS — F418 Other specified anxiety disorders: Secondary | ICD-10-CM | POA: Diagnosis not present

## 2018-03-14 MED ORDER — SODIUM CHLORIDE 0.9 % IV SOLN
INTRAVENOUS | Status: DC | PRN
  Administered 2018-03-14: 250 mL via INTRAVENOUS

## 2018-03-14 MED ORDER — AMITRIPTYLINE HCL 25 MG PO TABS
25.0000 mg | ORAL_TABLET | Freq: Every day | ORAL | Status: DC
  Administered 2018-03-14 – 2018-03-15 (×2): 25 mg via ORAL
  Filled 2018-03-14 (×3): qty 1

## 2018-03-14 MED ORDER — SENNOSIDES-DOCUSATE SODIUM 8.6-50 MG PO TABS
1.0000 | ORAL_TABLET | Freq: Two times a day (BID) | ORAL | Status: DC
  Administered 2018-03-14 – 2018-03-16 (×4): 1 via ORAL
  Filled 2018-03-14 (×4): qty 1

## 2018-03-14 NOTE — Evaluation (Signed)
Physical Therapy Evaluation Patient Details Name: Dawn Peters MRN: [id number] DOB: 1981-01-29 Today's Date: 03/14/2018   History of Present Illness  Pt admitted with exacerbation of MS and with hx of related L LE weakness as well as B THR, CKD and chronic pain  Clinical Impression  Pt admitted as above and presenting with functional mobility limitations 2* generalized weakness (esp L LE), chronic pain and ambulatory balance deficits.  Pt should progress to dc home with family assist.    Follow Up Recommendations Home health PT    Equipment Recommendations  None recommended by PT    Recommendations for Other Services       Precautions / Restrictions Precautions Precautions: Fall Restrictions Weight Bearing Restrictions: No      Mobility  Bed Mobility Overal bed mobility: Needs Assistance Bed Mobility: Supine to Sit     Supine to sit: Min assist     General bed mobility comments: Increased time and use of bed rails with assist needed for L LE  Transfers Overall transfer level: Needs assistance Equipment used: Rolling walker (2 wheeled) Transfers: Sit to/from Stand Sit to Stand: Min assist         General transfer comment: cues for use of UEs to self assist  Ambulation/Gait Ambulation/Gait assistance: Min assist;+2 safety/equipment Gait Distance (Feet): 28 Feet Assistive device: Rolling walker (2 wheeled) Gait Pattern/deviations: Step-to pattern;Step-through pattern;Decreased step length - right;Decreased step length - left;Shuffle;Trunk flexed;Decreased stance time - left     General Gait Details: cues for posture, position from RW and sequence.  Pt with stooped posture 2* back pain and demonstrating difficulty advancing L LE  Stairs            Wheelchair Mobility    Modified Rankin (Stroke Patients Only)       Balance Overall balance assessment: Needs assistance Sitting-balance support: No upper extremity supported;Feet  supported Sitting balance-Leahy Scale: Normal     Standing balance support: No upper extremity supported Standing balance-Leahy Scale: Fair                               Pertinent Vitals/Pain Pain Assessment: 0-10 Pain Score: 5  Pain Location: chronic back pain Pain Descriptors / Indicators: Aching;Sore Pain Intervention(s): Limited activity within patient's tolerance;Monitored during session;Premedicated before session    [place] expects to be discharged to:: Private residence Living Arrangements: Spouse/significant other;Children Available Help at Discharge: Family Type of Home: House Home Access: Stairs to enter Entrance Stairs-Rails: Chemical engineer of Steps: 2 Home Layout: Able to live on main level with bedroom/bathroom Home Equipment: Red Dog Mine - 4 wheels;Kasandra Knudsen - single point Additional Comments: LIves with finace' and son     Prior Function Level of Independence: Independent         Comments: Pt was in SNF for 3 months last year with MS exac     Hand Dominance   Dominant Hand: Right    Extremity/Trunk Assessment   Upper Extremity Assessment Upper Extremity Assessment: Generalized weakness    Lower Extremity Assessment Lower Extremity Assessment: Generalized weakness;LLE deficits/detail LLE Deficits / Details: 3-/5 strength knee and ankle with 2/5 at hip       Communication   Communication: No difficulties  Cognition Arousal/Alertness: Awake/alert Behavior During Therapy: WFL for tasks assessed/performed Overall Cognitive Status: Within Functional Limits for tasks assessed  General Comments      Exercises     Assessment/Plan    PT Assessment Patient needs continued PT services  PT Problem List Decreased strength;Decreased activity tolerance;Decreased balance;Decreased mobility;Decreased knowledge of use of DME;Obesity;Pain       PT  Treatment Interventions DME instruction;Gait training;Stair training;Functional mobility training;Therapeutic activities;Therapeutic exercise;Patient/family education    PT Goals (Current goals can be found in the Care Plan section)  Acute Rehab PT Goals Patient Stated Goal: Regain IND PT Goal Formulation: With patient Time For Goal Achievement: 03/28/18 Potential to Achieve Goals: Good    Frequency Min 3X/week   Barriers to discharge        Co-evaluation               AM-PAC PT "6 Clicks" Mobility  Outcome Measure Help needed turning from your back to your side while in a flat bed without using bedrails?: A Little Help needed moving from lying on your back to sitting on the side of a flat bed without using bedrails?: A Little Help needed moving to and from a bed to a chair (including a wheelchair)?: A Little Help needed standing up from a chair using your arms (e.g., wheelchair or bedside chair)?: A Little Help needed to walk in hospital room?: A Little Help needed climbing 3-5 steps with a railing? : A Lot 6 Click Score: 17    End of Session Equipment Utilized During Treatment: Gait belt Activity Tolerance: Patient limited by fatigue Patient left: in chair;with call bell/phone within reach;with family/visitor present Nurse Communication: Mobility status PT Visit Diagnosis: Difficulty in walking, not elsewhere classified (R26.2);Muscle weakness (generalized) (M62.81)    Time: 4854-6270 PT Time Calculation (min) (ACUTE ONLY): 14 min   Charges:   PT Evaluation $PT Eval Low Complexity: 1 Low          [place] Pager [phone] Office [phone]   Jakobe Blau 03/14/2018, 12:55 PM

## 2018-03-14 NOTE — Progress Notes (Signed)
RN rounded on patient room to find patient kneeling in front of recliner chair next to bed. Patient reports she had been sitting in chair and rose w/ walker to ambulate to BRP. Patient reports she did not call for staff assist. Patient had family in room. While in bathroom patient felt legs weakening, attempted to ambulate back to bed but was unable to make it. Fell to knees next to bed. Family member was attempting to lift patient when nurse entered.  Patient assisted back to bed w/ staff x3. Positioned for comfort, bed alarm on. Discussed w/ patient risks of falls and need to allow staff to assist her for safety. Patient denies striking head or any injuries. VSS. Will cont to monitor.

## 2018-03-14 NOTE — H&P (Addendum)
H&P        History and Physical    Dawn Peters NUU:[id number] DOB: 1980/07/03 DOA: 03/13/2018  PCP: System, Pcp Not In  Patient coming from: Home  I have personally briefly reviewed patient's old medical records in [place]  Chief Complaint: Left leg weakness and numbness  HPI: Dawn Peters is a 38 y.o. female with medical history significant of multiple sclerosis chronic kidney disease history of PE presents with left lower leg weakness and numbness.  Is been ongoing for about 2 weeks.  She states she did get a dose of steroids at the neurologist office at some point but a continue to progress that she is been unable to walk normally.  She was in the ER quite some time as MRI machine was down.  MRIs of her brain cervical and thoracic spine were done and appeared nonacute.  She denies any numbness,weakness elsewhere.  She denies any dysphasia or visual changes..  ED Course: ED consulted neurology Dr. Cornelius Moras  who spoke with patient's neurologist Dr. Felecia Shelling and steroids have been ordered.  He will see patient in consultation and suspect medications will be adjusted from there.  Review of Systems: No chest pain shortness breath fever cough positive for polyuria  All others reviewed with patient  and are  negative unless otherwise stated    Past Medical History:  Diagnosis Date  . Anticoagulant long-term use    Eliquis  . Anxiety   . Chronic pain   . CKD (chronic kidney disease), stage III ([place])   . Depression   . 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 Gait disturbance    due to MS  . GERD (gastroesophageal reflux disease)    watch diet  . 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 History of DVT of lower extremity 2007  . History of encephalopathy 34/7425   acute metabolic encephalopathy secondary to MS,  resolved  .  History of MRSA infection 2004   right hip infection post THA  . History of pulmonary embolus (PE) 2005  . Hydronephrosis, left    w/ acute kidney injury 02/ 2019  . Hypertension   . Left-sided weakness    due to MS  . Migraines   . MS (multiple sclerosis) [facility]) neurologist-  dr Renne Musca   dx 2000--  . Muscle spasticity   . Neurogenic bladder    due to MS  . Pulmonary embolism, bilateral ([place]) 09/17/2017   treated w/ eliquis  . Strains to urinate   . Urgency of urination     Past Surgical History:  Procedure Laterality Date  . CYSTOSCOPY WITH RETROGRADE PYELOGRAM, URETEROSCOPY AND STENT PLACEMENT Bilateral 11/29/2017   Procedure: BILATERAL  RETROGRADE PYELOGRAM, LEFT DIAGNOSIC URETEROSCOPY AND STENT LEFT PLACEMENT;  Surgeon: Ardis Hughs, MD;  Location: [facility];  Service: Urology;  Laterality: Bilateral;  . HIP ARTHROSCOPY Left 07-05-2013   dr pill @[facility]    iliopsosas release, synovectomy  . REVISION TOTAL HIP ARTHROPLASTY Right 03-28-2003    dr Alvan Dame @[facility]   . TOTAL HIP ARTHROPLASTY Bilateral left 11-05-2002  dr Alvan Dame @[facility] ;   right 06/ 2004  @[facility]      reports that she has never smoked. She has never used smokeless tobacco. She reports that she does not drink alcohol or use drugs.  Allergies  Allergen Reactions  . Ace  Inhibitors Swelling  . Amoxicillin Itching  . Lisinopril Swelling    Lower lip swelling    Family History  Problem Relation Age of Onset  . Healthy Mother   . Healthy Father   . Breast cancer Other        paternal grandmother dx age 71  . Colon cancer Other        paternal grandmother  . Hypertension Other   . Diabetes Other    Cousins with Crohn's disease and lupus  Prior to Admission medications   Medication Sig Start Date End Date Taking? Authorizing Provider  amantadine (SYMMETREL) 100 MG capsule Take 100 mg by mouth 2 (two) times daily. 09/11/17  Yes [provider]  amitriptyline (ELAVIL) 25 MG tablet TAKE 1-2  TABLETS BY MOUTH AT BEDTIME 02/26/18  Yes Sater, Nanine Means, MD  amLODipine (NORVASC) 5 MG tablet Take 1 tablet (5 mg total) by mouth daily. Patient taking differently: Take 5 mg by mouth every morning.  05/21/17  Yes Arrien, Jimmy Picket, MD  amphetamine-dextroamphetamine (ADDERALL XR) 20 MG 24 hr capsule Take 1 capsule (20 mg total) by mouth daily. 01/18/18  Yes Sater, Nanine Means, MD  apixaban (ELIQUIS) 5 MG TABS tablet Take 1 tablet (5 mg total) by mouth 2 (two) times daily. 09/26/17  Yes Kayleen Memos, DO  baclofen (LIORESAL) 20 MG tablet Take 1 tablet (20 mg total) by mouth 3 (three) times daily as needed (for ms). Patient taking differently: Take 20 mg by mouth 4 (four) times daily.  08/03/17  Yes Sater, Nanine Means, MD  busPIRone (BUSPAR) 15 MG tablet Take 1 tablet (15 mg total) by mouth 2 (two) times daily. 08/02/17  Yes Sater, Nanine Means, MD  citalopram (CELEXA) 20 MG tablet Take 20 mg by mouth every morning.  09/11/17  Yes [provider]  dalfampridine 10 MG TB12 Take 1 tablet (10 mg total) by mouth 2 (two) times daily. Patient taking differently: Take 10 mg by mouth 2 (two) times daily.  08/18/17  Yes Sater, Nanine Means, MD  Dimethyl Fumarate (TECFIDERA) 240 MG CPDR Take 240 mg by mouth 2 (two) times daily.    Yes [provider]  gabapentin (NEURONTIN) 800 MG tablet Take 1 tablet (800 mg total) by mouth 3 (three) times daily. 10/19/17  Yes Sater, Nanine Means, MD  hydrOXYzine (ATARAX/VISTARIL) 10 MG tablet Take 10 mg by mouth 3 (three) times daily as needed for anxiety. 09/11/17  Yes [provider]  lamoTRIgine (LAMICTAL) 200 MG tablet Take 1 tablet (200 mg total) by mouth 2 (two) times daily. 08/05/16  Yes Sater, Nanine Means, MD  metoprolol tartrate (LOPRESSOR) 25 MG tablet Take 0.5 tablets (12.5 mg total) by mouth 2 (two) times daily. 05/21/17 03/13/18 Yes Arrien, Jimmy Picket, MD  Multiple Vitamin (MULTIVITAMIN WITH MINERALS) TABS tablet Take 1 tablet by mouth daily. 09/20/17   Yes Kayleen Memos, DO  Oxcarbazepine (TRILEPTAL) 300 MG tablet Take 1 tablet (300 mg total) by mouth 2 (two) times daily. 11/11/15  Yes Sater, Nanine Means, MD  oxyCODONE (OXY IR/ROXICODONE) 5 MG immediate release tablet Take 5 mg by mouth every 4 (four) hours as needed for severe pain.   Yes [provider]  pregabalin (LYRICA) 225 MG capsule Take 1 capsule (225 mg total) by mouth 2 (two) times daily. 09/20/17  Yes Sater, Nanine Means, MD  SUMAtriptan (IMITREX) 100 MG tablet Take 1 tablet (100 mg total) by mouth once as needed for migraine. May repeat in 2  hours if headache persists or recurs. 12/28/16  Yes Sater, Nanine Means, MD  tamsulosin (FLOMAX) 0.4 MG CAPS capsule Take 1 capsule (0.4 mg total) by mouth daily. Patient taking differently: Take 0.4 mg by mouth daily after breakfast.  09/20/17  Yes Sater, Nanine Means, MD  tiZANidine (ZANAFLEX) 4 MG tablet TAKE 1 TABLET BY MOUTH AT BEDTIME 02/26/18  Yes Sater, Nanine Means, MD  valACYclovir (VALTREX) 500 MG tablet Take 1 tablet (500 mg total) by mouth 2 (two) times daily. 03/20/17  Yes Sater, Nanine Means, MD  methylPREDNISolone (MEDROL) 4 MG tablet Take as directed. Patient not taking: Reported on 03/13/2018 11/16/17   Sater, Nanine Means, MD  phenazopyridine (PYRIDIUM) 200 MG tablet Take 1 tablet (200 mg total) by mouth 3 (three) times daily as needed for pain. Patient not taking: Reported on 03/13/2018 11/29/17   Ardis Hughs, MD  traMADol (ULTRAM-ER) 300 MG 24 hr tablet Take 1 tablet (300 mg total) by mouth daily. Patient not taking: Reported on 03/13/2018 11/27/17   Britt Bottom, MD    Physical Exam: Vitals:   03/13/18 1853 03/13/18 1900 03/13/18 2027 03/13/18 2027  BP: 120/82 125/85 (!) 139/92 (!) 139/92  Pulse: (!) 103 (!) 103 (!) 101 (!) 101  Resp: 17 15  18   Temp:   98.5 F (36.9 C) 98.5 F (36.9 C)  TempSrc:   Oral   SpO2: 94% 96% 100% 100%  Weight:   99.2 kg   Height:   5\' 6"  (1.676 m)     Constitutional: NAD, calm,  comfortable Vitals:   03/13/18 1853 03/13/18 1900 03/13/18 2027 03/13/18 2027  BP: 120/82 125/85 (!) 139/92 (!) 139/92  Pulse: (!) 103 (!) 103 (!) 101 (!) 101  Resp: 17 15  18   Temp:   98.5 F (36.9 C) 98.5 F (36.9 C)  TempSrc:   Oral   SpO2: 94% 96% 100% 100%  Weight:   99.2 kg   Height:   5\' 6"  (1.676 m)    Eyes: PERRL, lids and conjunctivae normal ENMT: Mucous membranes are moist. Posterior pharynx clear of any exudate or lesions.Normal dentition.  Neck: normal, supple, no masses,  respiratory: clear to auscultation bilaterally, no wheezing, no crackles. Normal respiratory effort. No accessory muscle use.  Cardiovascular: Regular rate and rhythm, no murmurs / rubs / gallops.  Trace lower extremity edema. 2+ pedal pulses.   Abdomen: no tenderness, no masses palpated. No hepatosplenomegaly. Bowel sounds positive.  Musculoskeletal: no clubbing / cyanosis. No joint deformity upper and lower extremities.  no contractures. Normal muscle tone.  Neurologic: CN 2-12 grossly intact.  Left lower extremity 3+ out of 5 strength, plantar flexors 4+5 right lower extremity upper extremities normal bilaterally.   Psychiatric: Normal judgment and insight. Alert and oriented x 3. Normal mood.     Labs on Admission: I have personally reviewed following labs and imaging studies  CBC: Recent Labs  Lab 03/13/18 0438  WBC 4.5  NEUTROABS 2.8  HGB 11.4*  HCT 35.8*  MCV 107.2*  PLT 353   Basic Metabolic Panel: Recent Labs  Lab 03/13/18 0438  NA 141  K 4.1  CL 107  CO2 28  GLUCOSE 97  BUN 21*  CREATININE 1.43*  CALCIUM 8.9   GFR: Estimated Creatinine Clearance: 62.8 mL/min (A) (by C-G formula based on SCr of 1.43 mg/dL (H)). Liver Function Tests: No results for input(s): AST, ALT, ALKPHOS, BILITOT, PROT, ALBUMIN in the last 168 hours. No results for input(s): LIPASE, AMYLASE in  the last 168 hours. No results for input(s): AMMONIA in the last 168 hours. Coagulation Profile: No  results for input(s): INR, PROTIME in the last 168 hours. Cardiac Enzymes: No results for input(s): CKTOTAL, CKMB, CKMBINDEX, TROPONINI in the last 168 hours. BNP (last 3 results) No results for input(s): PROBNP in the last 8760 hours. HbA1C: No results for input(s): HGBA1C in the last 72 hours. CBG: No results for input(s): GLUCAP in the last 168 hours. Lipid Profile: No results for input(s): CHOL, HDL, LDLCALC, TRIG, CHOLHDL, LDLDIRECT in the last 72 hours. Thyroid Function Tests: No results for input(s): TSH, T4TOTAL, FREET4, T3FREE, THYROIDAB in the last 72 hours. Anemia Panel: No results for input(s): VITAMINB12, FOLATE, FERRITIN, TIBC, IRON, RETICCTPCT in the last 72 hours. Urine analysis:    Component Value Date/Time   COLORURINE YELLOW 03/13/2018 0438   APPEARANCEUR HAZY (A) 03/13/2018 0438   APPEARANCEUR Clear 06/21/2017 1650   LABSPEC 1.027 03/13/2018 0438   PHURINE 5.0 03/13/2018 0438   GLUCOSEU NEGATIVE 03/13/2018 0438   HGBUR LARGE (A) 03/13/2018 0438   BILIRUBINUR NEGATIVE 03/13/2018 0438   BILIRUBINUR Negative 06/21/2017 1650   KETONESUR 5 (A) 03/13/2018 0438   PROTEINUR NEGATIVE 03/13/2018 0438   UROBILINOGEN 0.2 12/15/2016 1136   NITRITE NEGATIVE 03/13/2018 0438   LEUKOCYTESUR NEGATIVE 03/13/2018 0438   LEUKOCYTESUR Negative 06/21/2017 1650    Radiological Exams on Admission: Mr Brain W And Wo Contrast  Result Date: 03/13/2018 CLINICAL DATA:  Left flank pain and tingling. Personal history of multiple sclerosis. One week history of tingling in the left lower extremity with progressive weakness and spasticity. EXAM: MRI HEAD WITHOUT AND WITH CONTRAST MRI CERVICAL SPINE WITHOUT AND WITH CONTRAST MRI THORACIC  SPINE WITHOUT AND WITH CONTRAST TECHNIQUE: Multiplanar, multiecho pulse sequences of the brain and surrounding structures, and cervical spine, to include the craniocervical junction and cervicothoracic junction, and the , were obtained without and with  intravenous contrast. CONTRAST:  10 mL Gadavist COMPARISON:  None. FINDINGS: MRI HEAD FINDINGS Brain: The study is moderately degraded by patient motion. Periventricular white matter changes bilaterally are stable. No new lesions are present. There is no enhancement or restricted diffusion. No acute infarct, hemorrhage, or mass lesion is present. The ventricles are of normal size. No significant extraaxial fluid collection is present. Postcontrast images demonstrate no pathologic enhancement. Vascular: Flow is present in the major intracranial arteries. Skull and upper cervical spine: The craniocervical junction is normal. Upper cervical spine is within normal limits. Marrow signal is unremarkable. Sinuses/Orbits: The paranasal sinuses and mastoid air cells are clear. The globes and orbits are within normal limits. MRI CERVICAL SPINE FINDINGS Alignment: AP alignment is anatomic. There is no significant scoliosis. Vertebrae: Normal signal is present. Vertebral body heights alignment are normal. No pathologic enhancement is present. Cord: Normal signal is present in the cervical and upper thoracic spinal cord to the lowest imaged level, T2-3. No pathologic enhancement is present Posterior Fossa, vertebral arteries, paraspinal tissues: Craniocervical junction is normal. Flow is present in the major intracranial arteries. Disc levels: No significant disc disease or stenosis is present within the cervical spine. MRI THORACIC SPINE FINDINGS Alignment: AP alignment is anatomic. Vertebrae: Marrow signal and vertebral body heights are normal. No pathologic enhancement is present. Cord: Normal signal is present throughout the spinal cord to the conus medullaris which terminates at L1-2. Paraspinal tissues: Left-sided hydronephrosis is again seen. Limited imaging of the abdomen is otherwise unremarkable. Disc levels: No significant focal disc intrusion or stenosis is present. Foramina  are patent bilaterally. IMPRESSION: 1.  Stable periventricular white matter changes compatible with the given diagnosis of multiple sclerosis. No evidence for disease progression. 2. No acute intracranial abnormality. 3. No significant demyelinating disease within the cervical or thoracic spine. 4. Significant disc disease in the cervical or thoracic spine. 5. Chronic left-sided hydronephrosis. Electronically Signed   By: San Morelle M.D.   On: 03/13/2018 15:57   Mr Cervical Spine W Or Wo Contrast  Result Date: 03/13/2018 CLINICAL DATA:  Left flank pain and tingling. Personal history of multiple sclerosis. One week history of tingling in the left lower extremity with progressive weakness and spasticity. EXAM: MRI HEAD WITHOUT AND WITH CONTRAST MRI CERVICAL SPINE WITHOUT AND WITH CONTRAST MRI THORACIC  SPINE WITHOUT AND WITH CONTRAST TECHNIQUE: Multiplanar, multiecho pulse sequences of the brain and surrounding structures, and cervical spine, to include the craniocervical junction and cervicothoracic junction, and the , were obtained without and with intravenous contrast. CONTRAST:  10 mL Gadavist COMPARISON:  None. FINDINGS: MRI HEAD FINDINGS Brain: The study is moderately degraded by patient motion. Periventricular white matter changes bilaterally are stable. No new lesions are present. There is no enhancement or restricted diffusion. No acute infarct, hemorrhage, or mass lesion is present. The ventricles are of normal size. No significant extraaxial fluid collection is present. Postcontrast images demonstrate no pathologic enhancement. Vascular: Flow is present in the major intracranial arteries. Skull and upper cervical spine: The craniocervical junction is normal. Upper cervical spine is within normal limits. Marrow signal is unremarkable. Sinuses/Orbits: The paranasal sinuses and mastoid air cells are clear. The globes and orbits are within normal limits. MRI CERVICAL SPINE FINDINGS Alignment: AP alignment is anatomic. There is no  significant scoliosis. Vertebrae: Normal signal is present. Vertebral body heights alignment are normal. No pathologic enhancement is present. Cord: Normal signal is present in the cervical and upper thoracic spinal cord to the lowest imaged level, T2-3. No pathologic enhancement is present Posterior Fossa, vertebral arteries, paraspinal tissues: Craniocervical junction is normal. Flow is present in the major intracranial arteries. Disc levels: No significant disc disease or stenosis is present within the cervical spine. MRI THORACIC SPINE FINDINGS Alignment: AP alignment is anatomic. Vertebrae: Marrow signal and vertebral body heights are normal. No pathologic enhancement is present. Cord: Normal signal is present throughout the spinal cord to the conus medullaris which terminates at L1-2. Paraspinal tissues: Left-sided hydronephrosis is again seen. Limited imaging of the abdomen is otherwise unremarkable. Disc levels: No significant focal disc intrusion or stenosis is present. Foramina are patent bilaterally. IMPRESSION: 1. Stable periventricular white matter changes compatible with the given diagnosis of multiple sclerosis. No evidence for disease progression. 2. No acute intracranial abnormality. 3. No significant demyelinating disease within the cervical or thoracic spine. 4. Significant disc disease in the cervical or thoracic spine. 5. Chronic left-sided hydronephrosis. Electronically Signed   By: San Morelle M.D.   On: 03/13/2018 15:57   Mr Thoracic Spine W Wo Contrast  Result Date: 03/13/2018 CLINICAL DATA:  Left flank pain and tingling. Personal history of multiple sclerosis. One week history of tingling in the left lower extremity with progressive weakness and spasticity. EXAM: MRI HEAD WITHOUT AND WITH CONTRAST MRI CERVICAL SPINE WITHOUT AND WITH CONTRAST MRI THORACIC  SPINE WITHOUT AND WITH CONTRAST TECHNIQUE: Multiplanar, multiecho pulse sequences of the brain and surrounding structures,  and cervical spine, to include the craniocervical junction and cervicothoracic junction, and the , were obtained without and with intravenous contrast. CONTRAST:  10 mL Gadavist  COMPARISON:  None. FINDINGS: MRI HEAD FINDINGS Brain: The study is moderately degraded by patient motion. Periventricular white matter changes bilaterally are stable. No new lesions are present. There is no enhancement or restricted diffusion. No acute infarct, hemorrhage, or mass lesion is present. The ventricles are of normal size. No significant extraaxial fluid collection is present. Postcontrast images demonstrate no pathologic enhancement. Vascular: Flow is present in the major intracranial arteries. Skull and upper cervical spine: The craniocervical junction is normal. Upper cervical spine is within normal limits. Marrow signal is unremarkable. Sinuses/Orbits: The paranasal sinuses and mastoid air cells are clear. The globes and orbits are within normal limits. MRI CERVICAL SPINE FINDINGS Alignment: AP alignment is anatomic. There is no significant scoliosis. Vertebrae: Normal signal is present. Vertebral body heights alignment are normal. No pathologic enhancement is present. Cord: Normal signal is present in the cervical and upper thoracic spinal cord to the lowest imaged level, T2-3. No pathologic enhancement is present Posterior Fossa, vertebral arteries, paraspinal tissues: Craniocervical junction is normal. Flow is present in the major intracranial arteries. Disc levels: No significant disc disease or stenosis is present within the cervical spine. MRI THORACIC SPINE FINDINGS Alignment: AP alignment is anatomic. Vertebrae: Marrow signal and vertebral body heights are normal. No pathologic enhancement is present. Cord: Normal signal is present throughout the spinal cord to the conus medullaris which terminates at L1-2. Paraspinal tissues: Left-sided hydronephrosis is again seen. Limited imaging of the abdomen is otherwise  unremarkable. Disc levels: No significant focal disc intrusion or stenosis is present. Foramina are patent bilaterally. IMPRESSION: 1. Stable periventricular white matter changes compatible with the given diagnosis of multiple sclerosis. No evidence for disease progression. 2. No acute intracranial abnormality. 3. No significant demyelinating disease within the cervical or thoracic spine. 4. Significant disc disease in the cervical or thoracic spine. 5. Chronic left-sided hydronephrosis. Electronically Signed   By: San Morelle M.D.   On: 03/13/2018 15:57      Assessment/Plan Principal Problem:   Multiple sclerosis exacerbation (HCC) Active Problems:   Essential hypertension   Depression with anxiety   Urinary hesitancy/ neurogenic bladder    Mild renal insufficiency   Pulmonary embolism (HCC) Hx of     -IV steroids per neurology.  Continue home meds for MS for now and await any further recommendations -Chronic kidney disease stable -History of pulmonary embolism continue Eliquis  -continue home meds for chronic medical issues  DVT prophylaxis: Continue Eliquis Code Status: Full Disposition Plan: Home 1 to 2 days Consults called: Neurology Dr.*Jindong Admission status: Observation medical    Morrell Fluke Johnson-Pitts MD [facility] Pager [phone]  If 7PM-7AM, please contact night-coverage [email or web address] Password [email or web address]  03/14/2018, 12:04 AM

## 2018-03-14 NOTE — Progress Notes (Signed)
Physical Therapy Treatment Patient Details Name: Dawn Peters MRN: [id number] DOB: 01-02-1981 Today's Date: 03/14/2018    History of Present Illness Pt admitted with exacerbation of MS and with hx of related L LE weakness as well as B THR, CKD and chronic pain    PT Comments    Pt continues unsteady on feet and at high risk for falling if unassisted but with increased activity tolerance this pm.    Follow Up Recommendations  Home health PT     Equipment Recommendations  None recommended by PT    Recommendations for Other Services       Precautions / Restrictions Precautions Precautions: Fall Restrictions Weight Bearing Restrictions: No    Mobility  Bed Mobility               General bed mobility comments: Pt up in chair on arrival and in bathroom on departure  Transfers Overall transfer level: Needs assistance Equipment used: Rolling walker (2 wheeled) Transfers: Sit to/from Stand Sit to Stand: Min assist         General transfer comment: cues for use of UEs to self assist; min assist to bring wt up and fwd and to balance in initial standing  Ambulation/Gait Ambulation/Gait assistance: Min assist;+2 safety/equipment Gait Distance (Feet): 100 Feet Assistive device: Rolling walker (2 wheeled) Gait Pattern/deviations: Step-to pattern;Step-through pattern;Decreased step length - right;Decreased step length - left;Shuffle;Trunk flexed;Decreased stance time - left Gait velocity: decr   General Gait Details: cues for posture, position from RW and sequence.  Pt continues with stooped posture and demonstrating difficulty advancing L LE but improvement noted vs this am.   Stairs             Wheelchair Mobility    Modified Rankin (Stroke Patients Only)       Balance Overall balance assessment: Needs assistance Sitting-balance support: No upper extremity supported;Feet supported Sitting balance-Leahy Scale: Normal     Standing balance  support: No upper extremity supported Standing balance-Leahy Scale: Fair                              Cognition Arousal/Alertness: Awake/alert Behavior During Therapy: WFL for tasks assessed/performed Overall Cognitive Status: Within Functional Limits for tasks assessed                                        Exercises      General Comments        Pertinent Vitals/Pain Pain Assessment: 0-10 Pain Score: 5  Pain Location: chronic back pain Pain Descriptors / Indicators: Aching;Sore Pain Intervention(s): Limited activity within patient's tolerance;Monitored during session    Home Living                      Prior Function            PT Goals (current goals can now be found in the care plan section) Acute Rehab PT Goals Patient Stated Goal: Regain IND PT Goal Formulation: With patient Time For Goal Achievement: 03/28/18 Potential to Achieve Goals: Good Progress towards PT goals: Progressing toward goals    Frequency    Min 3X/week      PT Plan Current plan remains appropriate    Co-evaluation              AM-PAC PT "6 Clicks" Mobility  Outcome Measure  Help needed turning from your back to your side while in a flat bed without using bedrails?: A Little Help needed moving from lying on your back to sitting on the side of a flat bed without using bedrails?: A Little Help needed moving to and from a bed to a chair (including a wheelchair)?: A Little Help needed standing up from a chair using your arms (e.g., wheelchair or bedside chair)?: A Little Help needed to walk in hospital room?: A Little Help needed climbing 3-5 steps with a railing? : A Lot 6 Click Score: 17    End of Session Equipment Utilized During Treatment: Gait belt Activity Tolerance: Patient limited by fatigue;Patient tolerated treatment well Patient left: Other (comment);with family/visitor present(Pt on commode and agrees to pull cord before getting  up) Nurse Communication: Mobility status;Other (comment)(Pt in bathroom ) PT Visit Diagnosis: Difficulty in walking, not elsewhere classified (R26.2);Muscle weakness (generalized) (M62.81)     Time: 6568-1275 PT Time Calculation (min) (ACUTE ONLY): 20 min  Charges:  $Gait Training: 8-22 mins                     [place] Pager [phone] Office [phone]    Pinchas Reither 03/14/2018, 5:47 PM

## 2018-03-14 NOTE — Progress Notes (Signed)
PROGRESS NOTE  Dawn Peters QHU:[id number] DOB: 14-Apr-1980 DOA: 03/13/2018 PCP: System, Pcp Not In  HPI/Recap of past 24 hours:  Overnight event noted, she denies pain Husband at bedside with permission  Assessment/Plan: Principal Problem:   Multiple sclerosis exacerbation ([place]) Active Problems:   Essential hypertension   Depression with anxiety   Urinary hesitancy   Mild renal insufficiency   Pulmonary embolism ([facility])  MS flares On iv solumedrol On dimenthyl fumerate, dalfampridine, Neurology consulted, Pt eval  HTN: stable on home meds norvasc/lopressor  CKDIII Cr at baseline Renal dosing meds  H/o DVt/PE on Eliquis  H/o bilateral hip replacement  H/o anxiety/depression Continue home meds  Herpes zoster prophylaxis -Continue Valtrex  History of migraine headaches -Continue imitrex PRN  Chronic pain -Continue home medications including oxycodone, baclofen  H/o left side hydronephrosis s/p stent placement in 11/2017, stent removed  denies flank pain, ua on bacteria, cr at baseline  Obesity: Body mass index is 35.28 kg/m.   Code Status: full  Family Communication: patient and husband at bedside  Disposition Plan: home with home health with neurology clearance   Consultants:  neurology  Procedures:  none  Antibiotics:  none   Objective: BP 113/83 (BP Location: Left Arm)   Pulse 72   Temp 97.7 F (36.5 C) (Oral)   Resp 14   Ht 5\' 6"  (1.676 m)   Wt 99.2 kg   LMP 03/13/2018   SpO2 99%   BMI 35.28 kg/m   Intake/Output Summary (Last 24 hours) at 03/14/2018 1047 Last data filed at 03/14/2018 1027 Gross per 24 hour  Intake 170 ml  Output 550 ml  Net -380 ml   Filed Weights   03/13/18 0009 03/13/18 2027  Weight: 99.2 kg 99.2 kg    Exam: Patient is examined daily including today on 03/14/2018, exams remain the same as of yesterday except that has changed    General:  NAD  Cardiovascular: RRR  Respiratory:  CTABL  Abdomen: Soft/ND/NT, positive BS  Musculoskeletal: bilateral lower extremity mild pitting Edema ( reports chronic), lower extremity weakness  Neuro: alert, oriented   Data Reviewed: Basic Metabolic Panel: Recent Labs  Lab 03/13/18 0438  NA 141  K 4.1  CL 107  CO2 28  GLUCOSE 97  BUN 21*  CREATININE 1.43*  CALCIUM 8.9   Liver Function Tests: No results for input(s): AST, ALT, ALKPHOS, BILITOT, PROT, ALBUMIN in the last 168 hours. No results for input(s): LIPASE, AMYLASE in the last 168 hours. No results for input(s): AMMONIA in the last 168 hours. CBC: Recent Labs  Lab 03/13/18 0438  WBC 4.5  NEUTROABS 2.8  HGB 11.4*  HCT 35.8*  MCV 107.2*  PLT 361   Cardiac Enzymes:   No results for input(s): CKTOTAL, CKMB, CKMBINDEX, TROPONINI in the last 168 hours. BNP (last 3 results) No results for input(s): BNP in the last 8760 hours.  ProBNP (last 3 results) No results for input(s): PROBNP in the last 8760 hours.  CBG: No results for input(s): GLUCAP in the last 168 hours.  No results found for this or any previous visit (from the past 240 hour(s)).   Studies: Mr Jeri Cos And Wo Contrast  Result Date: 03/13/2018 CLINICAL DATA:  Left flank pain and tingling. Personal history of multiple sclerosis. One week history of tingling in the left lower extremity with progressive weakness and spasticity. EXAM: MRI HEAD WITHOUT AND WITH CONTRAST MRI CERVICAL SPINE WITHOUT AND WITH CONTRAST MRI THORACIC  SPINE WITHOUT AND  WITH CONTRAST TECHNIQUE: Multiplanar, multiecho pulse sequences of the brain and surrounding structures, and cervical spine, to include the craniocervical junction and cervicothoracic junction, and the , were obtained without and with intravenous contrast. CONTRAST:  10 mL Gadavist COMPARISON:  None. FINDINGS: MRI HEAD FINDINGS Brain: The study is moderately degraded by patient motion. Periventricular white matter changes bilaterally are stable. No new lesions are  present. There is no enhancement or restricted diffusion. No acute infarct, hemorrhage, or mass lesion is present. The ventricles are of normal size. No significant extraaxial fluid collection is present. Postcontrast images demonstrate no pathologic enhancement. Vascular: Flow is present in the major intracranial arteries. Skull and upper cervical spine: The craniocervical junction is normal. Upper cervical spine is within normal limits. Marrow signal is unremarkable. Sinuses/Orbits: The paranasal sinuses and mastoid air cells are clear. The globes and orbits are within normal limits. MRI CERVICAL SPINE FINDINGS Alignment: AP alignment is anatomic. There is no significant scoliosis. Vertebrae: Normal signal is present. Vertebral body heights alignment are normal. No pathologic enhancement is present. Cord: Normal signal is present in the cervical and upper thoracic spinal cord to the lowest imaged level, T2-3. No pathologic enhancement is present Posterior Fossa, vertebral arteries, paraspinal tissues: Craniocervical junction is normal. Flow is present in the major intracranial arteries. Disc levels: No significant disc disease or stenosis is present within the cervical spine. MRI THORACIC SPINE FINDINGS Alignment: AP alignment is anatomic. Vertebrae: Marrow signal and vertebral body heights are normal. No pathologic enhancement is present. Cord: Normal signal is present throughout the spinal cord to the conus medullaris which terminates at L1-2. Paraspinal tissues: Left-sided hydronephrosis is again seen. Limited imaging of the abdomen is otherwise unremarkable. Disc levels: No significant focal disc intrusion or stenosis is present. Foramina are patent bilaterally. IMPRESSION: 1. Stable periventricular white matter changes compatible with the given diagnosis of multiple sclerosis. No evidence for disease progression. 2. No acute intracranial abnormality. 3. No significant demyelinating disease within the cervical  or thoracic spine. 4. Significant disc disease in the cervical or thoracic spine. 5. Chronic left-sided hydronephrosis. Electronically Signed   By: San Morelle M.D.   On: 03/13/2018 15:57   Mr Cervical Spine W Or Wo Contrast  Result Date: 03/13/2018 CLINICAL DATA:  Left flank pain and tingling. Personal history of multiple sclerosis. One week history of tingling in the left lower extremity with progressive weakness and spasticity. EXAM: MRI HEAD WITHOUT AND WITH CONTRAST MRI CERVICAL SPINE WITHOUT AND WITH CONTRAST MRI THORACIC  SPINE WITHOUT AND WITH CONTRAST TECHNIQUE: Multiplanar, multiecho pulse sequences of the brain and surrounding structures, and cervical spine, to include the craniocervical junction and cervicothoracic junction, and the , were obtained without and with intravenous contrast. CONTRAST:  10 mL Gadavist COMPARISON:  None. FINDINGS: MRI HEAD FINDINGS Brain: The study is moderately degraded by patient motion. Periventricular white matter changes bilaterally are stable. No new lesions are present. There is no enhancement or restricted diffusion. No acute infarct, hemorrhage, or mass lesion is present. The ventricles are of normal size. No significant extraaxial fluid collection is present. Postcontrast images demonstrate no pathologic enhancement. Vascular: Flow is present in the major intracranial arteries. Skull and upper cervical spine: The craniocervical junction is normal. Upper cervical spine is within normal limits. Marrow signal is unremarkable. Sinuses/Orbits: The paranasal sinuses and mastoid air cells are clear. The globes and orbits are within normal limits. MRI CERVICAL SPINE FINDINGS Alignment: AP alignment is anatomic. There is no significant scoliosis. Vertebrae: Normal signal  is present. Vertebral body heights alignment are normal. No pathologic enhancement is present. Cord: Normal signal is present in the cervical and upper thoracic spinal cord to the lowest imaged  level, T2-3. No pathologic enhancement is present Posterior Fossa, vertebral arteries, paraspinal tissues: Craniocervical junction is normal. Flow is present in the major intracranial arteries. Disc levels: No significant disc disease or stenosis is present within the cervical spine. MRI THORACIC SPINE FINDINGS Alignment: AP alignment is anatomic. Vertebrae: Marrow signal and vertebral body heights are normal. No pathologic enhancement is present. Cord: Normal signal is present throughout the spinal cord to the conus medullaris which terminates at L1-2. Paraspinal tissues: Left-sided hydronephrosis is again seen. Limited imaging of the abdomen is otherwise unremarkable. Disc levels: No significant focal disc intrusion or stenosis is present. Foramina are patent bilaterally. IMPRESSION: 1. Stable periventricular white matter changes compatible with the given diagnosis of multiple sclerosis. No evidence for disease progression. 2. No acute intracranial abnormality. 3. No significant demyelinating disease within the cervical or thoracic spine. 4. Significant disc disease in the cervical or thoracic spine. 5. Chronic left-sided hydronephrosis. Electronically Signed   By: San Morelle M.D.   On: 03/13/2018 15:57   Mr Thoracic Spine W Wo Contrast  Result Date: 03/13/2018 CLINICAL DATA:  Left flank pain and tingling. Personal history of multiple sclerosis. One week history of tingling in the left lower extremity with progressive weakness and spasticity. EXAM: MRI HEAD WITHOUT AND WITH CONTRAST MRI CERVICAL SPINE WITHOUT AND WITH CONTRAST MRI THORACIC  SPINE WITHOUT AND WITH CONTRAST TECHNIQUE: Multiplanar, multiecho pulse sequences of the brain and surrounding structures, and cervical spine, to include the craniocervical junction and cervicothoracic junction, and the , were obtained without and with intravenous contrast. CONTRAST:  10 mL Gadavist COMPARISON:  None. FINDINGS: MRI HEAD FINDINGS Brain: The study is  moderately degraded by patient motion. Periventricular white matter changes bilaterally are stable. No new lesions are present. There is no enhancement or restricted diffusion. No acute infarct, hemorrhage, or mass lesion is present. The ventricles are of normal size. No significant extraaxial fluid collection is present. Postcontrast images demonstrate no pathologic enhancement. Vascular: Flow is present in the major intracranial arteries. Skull and upper cervical spine: The craniocervical junction is normal. Upper cervical spine is within normal limits. Marrow signal is unremarkable. Sinuses/Orbits: The paranasal sinuses and mastoid air cells are clear. The globes and orbits are within normal limits. MRI CERVICAL SPINE FINDINGS Alignment: AP alignment is anatomic. There is no significant scoliosis. Vertebrae: Normal signal is present. Vertebral body heights alignment are normal. No pathologic enhancement is present. Cord: Normal signal is present in the cervical and upper thoracic spinal cord to the lowest imaged level, T2-3. No pathologic enhancement is present Posterior Fossa, vertebral arteries, paraspinal tissues: Craniocervical junction is normal. Flow is present in the major intracranial arteries. Disc levels: No significant disc disease or stenosis is present within the cervical spine. MRI THORACIC SPINE FINDINGS Alignment: AP alignment is anatomic. Vertebrae: Marrow signal and vertebral body heights are normal. No pathologic enhancement is present. Cord: Normal signal is present throughout the spinal cord to the conus medullaris which terminates at L1-2. Paraspinal tissues: Left-sided hydronephrosis is again seen. Limited imaging of the abdomen is otherwise unremarkable. Disc levels: No significant focal disc intrusion or stenosis is present. Foramina are patent bilaterally. IMPRESSION: 1. Stable periventricular white matter changes compatible with the given diagnosis of multiple sclerosis. No evidence for  disease progression. 2. No acute intracranial abnormality. 3. No significant  demyelinating disease within the cervical or thoracic spine. 4. Significant disc disease in the cervical or thoracic spine. 5. Chronic left-sided hydronephrosis. Electronically Signed   By: San Morelle M.D.   On: 03/13/2018 15:57    Scheduled Meds: . amantadine  100 mg Oral BID  . amitriptyline  25-50 mg Oral QHS  . amLODipine  5 mg Oral q morning - 10a  . amphetamine-dextroamphetamine  20 mg Oral QAC breakfast  . apixaban  5 mg Oral BID  . baclofen  20 mg Oral TID  . busPIRone  15 mg Oral BID  . citalopram  20 mg Oral q morning - 10a  . dalfampridine  10 mg Oral BID  . Dimethyl Fumarate  240 mg Oral BID  . gabapentin  800 mg Oral TID  . lamoTRIgine  200 mg Oral BID  . metoprolol tartrate  12.5 mg Oral BID  . multivitamin with minerals  1 tablet Oral Daily  . Oxcarbazepine  300 mg Oral BID  . pregabalin  225 mg Oral BID  . tamsulosin  0.4 mg Oral QPC breakfast  . tiZANidine  4 mg Oral QHS  . valACYclovir  500 mg Oral BID    Continuous Infusions: . sodium chloride 250 mL (03/14/18 0954)  . methylPREDNISolone (SOLU-MEDROL) injection 1,000 mg (03/14/18 0959)     Time spent: 30mins I have personally reviewed and interpreted on  03/14/2018 daily labs, imagings as discussed above under date review session and assessment and plans.  I reviewed all nursing notes, pharmacy notes, consultant notes,  vitals, pertinent old records  I have discussed plan of care as described above with RN , patient and family on 03/14/2018   Florencia Reasons MD, PhD  [facility] Pager [phone]. If 7PM-7AM, please contact night-coverage at [email or web address], password [facility] 03/14/2018, 10:47 AM  LOS: 0 days

## 2018-03-14 NOTE — Progress Notes (Signed)
Around 3:50 am patient was assisted to the bathroom, advised and educated the patient to call when she is done. Around 4 am  Writer and NT were in the room making the bed and the patient called for help and found her in sitting position in the floor beside the commode. No injuries noted, denies pain, denies hitting of head, ROM intact in B/L UE and weakness in B/L LE. Assisted patient back to the bed, vitals were measured and its stable. On call MD is paged at 4:14 am, family was called at 4:23 am and notified. Will continue to monitor.

## 2018-03-15 DIAGNOSIS — E669 Obesity, unspecified: Secondary | ICD-10-CM | POA: Diagnosis not present

## 2018-03-15 DIAGNOSIS — I10 Essential (primary) hypertension: Secondary | ICD-10-CM | POA: Diagnosis not present

## 2018-03-15 DIAGNOSIS — N183 Chronic kidney disease, stage 3 (moderate): Secondary | ICD-10-CM | POA: Diagnosis not present

## 2018-03-15 DIAGNOSIS — G35 Multiple sclerosis: Secondary | ICD-10-CM | POA: Diagnosis not present

## 2018-03-15 DIAGNOSIS — Z86718 Personal history of other venous thrombosis and embolism: Secondary | ICD-10-CM | POA: Diagnosis not present

## 2018-03-15 DIAGNOSIS — R29898 Other symptoms and signs involving the musculoskeletal system: Secondary | ICD-10-CM | POA: Diagnosis not present

## 2018-03-15 DIAGNOSIS — F418 Other specified anxiety disorders: Secondary | ICD-10-CM | POA: Diagnosis not present

## 2018-03-15 LAB — BASIC METABOLIC PANEL
Anion gap: 8 (ref 5–15)
BUN: 20 mg/dL (ref 6–20)
CO2: 27 mmol/L (ref 22–32)
Calcium: 9.3 mg/dL (ref 8.9–10.3)
Chloride: 105 mmol/L (ref 98–111)
Creatinine, Ser: 1.39 mg/dL — ABNORMAL HIGH (ref 0.44–1.00)
GFR calc Af Amer: 55 mL/min — ABNORMAL LOW (ref >60)
GFR calc non Af Amer: 48 mL/min — ABNORMAL LOW (ref >60)
Glucose, Bld: 128 mg/dL — ABNORMAL HIGH (ref 70–99)
POTASSIUM: 3.8 mmol/L (ref 3.5–5.1)
Sodium: 140 mmol/L (ref 135–145)

## 2018-03-15 LAB — VITAMIN B12: VITAMIN B 12: 228 pg/mL (ref 180–914)

## 2018-03-15 LAB — CBC WITH DIFFERENTIAL/PLATELET
Abs Immature Granulocytes: 0.13 10*3/uL — ABNORMAL HIGH (ref 0.00–0.07)
Basophils Absolute: 0 10*3/uL (ref 0.0–0.1)
Basophils Relative: 0 %
EOS ABS: 0 10*3/uL (ref 0.0–0.5)
Eosinophils Relative: 0 %
HCT: 33.4 % — ABNORMAL LOW (ref 36.0–46.0)
Hemoglobin: 10.7 g/dL — ABNORMAL LOW (ref 12.0–15.0)
Immature Granulocytes: 1 %
Lymphocytes Relative: 6 %
Lymphs Abs: 0.8 10*3/uL (ref 0.7–4.0)
MCH: 33.5 pg (ref 26.0–34.0)
MCHC: 32 g/dL (ref 30.0–36.0)
MCV: 104.7 fL — ABNORMAL HIGH (ref 80.0–100.0)
Monocytes Absolute: 0.8 10*3/uL (ref 0.1–1.0)
Monocytes Relative: 6 %
Neutro Abs: 10.6 10*3/uL — ABNORMAL HIGH (ref 1.7–7.7)
Neutrophils Relative %: 87 %
Platelets: 330 10*3/uL (ref 150–400)
RBC: 3.19 MIL/uL — ABNORMAL LOW (ref 3.87–5.11)
RDW: 15.8 % — ABNORMAL HIGH (ref 11.5–15.5)
WBC: 12.2 10*3/uL — ABNORMAL HIGH (ref 4.0–10.5)
nRBC: 0 % (ref 0.0–0.2)

## 2018-03-15 LAB — FOLATE: Folate: 10.3 ng/mL (ref >5.9)

## 2018-03-15 MED ORDER — POLYETHYLENE GLYCOL 3350 17 G PO PACK: 17.0000 g | PACK | Freq: Every day | ORAL | 0 refills | Status: DC

## 2018-03-15 MED ORDER — POLYETHYLENE GLYCOL 3350 17 G PO PACK
17.0000 g | PACK | Freq: Every day | ORAL | Status: DC
  Administered 2018-03-15 – 2018-03-16 (×2): 17 g via ORAL
  Filled 2018-03-15 (×2): qty 1

## 2018-03-15 MED ORDER — SENNOSIDES-DOCUSATE SODIUM 8.6-50 MG PO TABS: 1.0000 | ORAL_TABLET | Freq: Every day | ORAL | 0 refills | Status: DC

## 2018-03-15 NOTE — Progress Notes (Signed)
Physical Therapy Treatment Patient Details Name: Dawn Peters MRN: [id number] DOB: [birth year] Today's Date: 03/15/2018    History of Present Illness Pt admitted with exacerbation of MS and with hx of related L LE weakness as well as B THR, CKD and chronic pain    PT Comments    Wheelchair delivered and in room.  Educated on safety features, brakes and leg rests.  Assisted with transfer from recliner to wheelchair with instruction on safety with turn completion and preferable towards her stronger side (right). Assisted with [facility] wheelchair mobility in hallway and to and from bathroom.  Rolled wc into BR right next to commode.  Assisted with transfer and donning underwear.  Assisted back to wheelchair as pt rolled up to sink to wash hands.  Assisted with wc mobility backward to recliner.  Once in recliner, instructed on some TE's to strengthen "whole body".  Performed 10 reps of chair pushups, 10 reps of sit to stand.     Follow Up Recommendations  Home health PT(pt would like to resume her OP at [facility])     Teacher, early years/pre (measurements PT);Wheelchair (measurements PT)  Pt asked about a Power WC  Recommendations for Other Services       Precautions / Restrictions Precautions Precautions: Fall Precaution Comments: L LE weakness Restrictions Weight Bearing Restrictions: No    Mobility  Bed Mobility Overal bed mobility: Needs Assistance Bed Mobility: Supine to Sit     Supine to sit: Supervision     General bed mobility comments: OOB in recliner   Transfers Overall transfer level: Needs assistance Equipment used: Rolling walker (2 wheeled) Transfers: Sit to/from [facility] Sit to Stand: Min guard Stand pivot transfers: Min assist;Mod assist       General transfer comment: 25% VC's on safety with turn completion as pt sat quickly going from recliner to wheelchair   Ambulation/Gait Ambulation/Gait  assistance: Supervision;Min guard Gait Distance (Feet): 15 Feet(amb to bathroom ) Assistive device: Rolling walker (2 wheeled) Gait Pattern/deviations: Step-through pattern;Decreased stance time - left;Trunk flexed;Decreased stride length Gait velocity: decreased    General Gait Details: treatment session focused on transfers in/out of wheelchair   Stairs             Wheelchair Mobility    Modified Rankin (Stroke Patients Only)       Balance Overall balance assessment: Needs assistance Sitting-balance support: No upper extremity supported;Feet supported Sitting balance-Leahy Scale: Normal     Standing balance support: Bilateral upper extremity supported Standing balance-Leahy Scale: Poor                              Cognition Arousal/Alertness: Awake/alert Behavior During Therapy: WFL for tasks assessed/performed Overall Cognitive Status: Within Functional Limits for tasks assessed                                        Exercises Other Exercises Other Exercises: instructed in use of theraband for UB exercises across multiple planes     General Comments        Pertinent Vitals/Pain Pain Assessment: Faces Faces Pain Scale: Hurts a little bit Pain Location: chronic back pain Pain Descriptors / Indicators: Aching;Sore Pain Intervention(s): Monitored during session    Home Living Family/patient expects to be discharged to:: Private residence Living Arrangements: Spouse/significant other;Children Available Help at  Discharge: Family Type of Home: House Home Access: Stairs to enter Entrance Stairs-Rails: Left;Right Home Layout: Able to live on main level with bedroom/bathroom Home Equipment: Walker - 4 wheels;Kasandra Knudsen - single point Additional Comments: LIves with finace' and son     Prior Function Level of Independence: Independent      Comments: Pt was in SNF for 3 months last year with MS exac   PT Goals (current goals can now  be found in the care plan section) Acute Rehab PT Goals Patient Stated Goal: Regain IND Progress towards PT goals: Progressing toward goals    Frequency    Min 3X/week      PT Plan Current plan remains appropriate    Co-evaluation              AM-PAC PT "6 Clicks" Mobility   Outcome Measure  Help needed turning from your back to your side while in a flat bed without using bedrails?: A Little Help needed moving from lying on your back to sitting on the side of a flat bed without using bedrails?: A Little Help needed moving to and from a bed to a chair (including a wheelchair)?: A Little Help needed standing up from a chair using your arms (e.g., wheelchair or bedside chair)?: A Little Help needed to walk in hospital room?: A Little Help needed climbing 3-5 steps with a railing? : Total 6 Click Score: 16    End of Session Equipment Utilized During Treatment: Gait belt Activity Tolerance: Patient tolerated treatment well Patient left: in chair;with chair alarm set;with family/visitor present   PT Visit Diagnosis: Difficulty in walking, not elsewhere classified (R26.2);Muscle weakness (generalized) (M62.81)     Time: 1600-1640 PT Time Calculation (min) (ACUTE ONLY): 40 min  Charges:  $Gait Training: 8-22 mins $Self Care/Home Management: 8-22 $Wheel Chair Management: 8-22 mins                     Rica Koyanagi  PTA Acute  Rehabilitation Services Pager      [phone] Office      [phone]

## 2018-03-15 NOTE — Progress Notes (Addendum)
Physical Therapy Treatment Patient Details Name: Dawn Peters MRN: [id number] DOB: 1981-02-22 Today's Date: 03/15/2018    History of Present Illness Pt admitted with exacerbation of MS and with hx of related L LE weakness as well as B THR, CKD and chronic pain    PT Comments    Pt in bed on arrival with periwick in place however pt c/o she was wet.  Assisted OOB to amb to bathroom per pt request to wash up before agreeing to amb in hallway.  Pt amb to bathroom using a walker at Supervision level.    Follow Up Recommendations  Home health PT     Equipment Recommendations  [phone] (pt stated her is "broken" wheelchair   Recommendations for Other Services       Precautions / Restrictions Precautions Precautions: Fall Precaution Comments: L LE weakness Restrictions Weight Bearing Restrictions: No    Mobility  Bed Mobility Overal bed mobility: Needs Assistance Bed Mobility: Supine to Sit     Supine to sit: Supervision     General bed mobility comments: increased, increased time but self able  Transfers Overall transfer level: Needs assistance Equipment used: Rolling walker (2 wheeled) Transfers: Sit to/from [facility] Sit to Stand: Supervision Stand pivot transfers: Supervision       General transfer comment: 25% VC's on safety with turn completion and proper hand placement esp with stand to sit  Ambulation/Gait Ambulation/Gait assistance: Supervision;Min guard Gait Distance (Feet): 15 Feet(amb to bathroom ) Assistive device: Rolling walker (2 wheeled) Gait Pattern/deviations: Step-through pattern;Decreased stance time - left;Trunk flexed;Decreased stride length Gait velocity: decreased    General Gait Details: 25% VC's on proper walker to self distance and safety with turns as pt is impulsive and distracted   Chief Strategy Officer    Modified Rankin (Stroke Patients Only)       Balance Overall  balance assessment: Needs assistance Sitting-balance support: No upper extremity supported;Feet supported Sitting balance-Leahy Scale: Normal     Standing balance support: Bilateral upper extremity supported Standing balance-Leahy Scale: Poor                              Cognition Arousal/Alertness: Awake/alert Behavior During Therapy: WFL for tasks assessed/performed Overall Cognitive Status: Within Functional Limits for tasks assessed                                        Exercises Other Exercises Other Exercises: instructed in use of theraband for UB exercises across multiple planes     General Comments        Pertinent Vitals/Pain Pain Assessment: No/denies pain Faces Pain Scale: Hurts little more Pain Location: chronic back pain Pain Descriptors / Indicators: Aching;Sore Pain Intervention(s): Limited activity within patient's tolerance;Monitored during session    [place] expects to be discharged to:: Private residence Living Arrangements: Spouse/significant other;Children Available Help at Discharge: Family Type of Home: House Home Access: Stairs to enter Entrance Stairs-Rails: Left;Right Home Layout: Able to live on main level with bedroom/bathroom Home Equipment: Environmental consultant - 4 wheels;Kasandra Knudsen - single point Additional Comments: LIves with finace' and son     Prior Function Level of Independence: Independent      Comments: Pt was in SNF for 3 months last year with MS exac  PT Goals (current goals can now be found in the care plan section) Acute Rehab PT Goals Patient Stated Goal: Regain IND Progress towards PT goals: Progressing toward goals    Frequency    Min 3X/week      PT Plan Current plan remains appropriate    Co-evaluation              AM-PAC PT "6 Clicks" Mobility   Outcome Measure  Help needed turning from your back to your side while in a flat bed without using bedrails?: A Little Help  needed moving from lying on your back to sitting on the side of a flat bed without using bedrails?: A Little Help needed moving to and from a bed to a chair (including a wheelchair)?: A Little Help needed standing up from a chair using your arms (e.g., wheelchair or bedside chair)?: A Little Help needed to walk in hospital room?: A Little Help needed climbing 3-5 steps with a railing? : A Little 6 Click Score: 18    End of Session Equipment Utilized During Treatment: Gait belt Activity Tolerance: Patient tolerated treatment well Patient left: with family/visitor present(pt on commode in bathroom with NT in room)   PT Visit Diagnosis: Difficulty in walking, not elsewhere classified (R26.2);Muscle weakness (generalized) (M62.81)     Time: 9417-4081 PT Time Calculation (min) (ACUTE ONLY): 12 min  Charges:  $Gait Training: 8-22 mins                     Rica Koyanagi  PTA Acute  Rehabilitation Services Pager      [phone] Office      [phone]

## 2018-03-15 NOTE — Evaluation (Signed)
Occupational Therapy Evaluation Patient Details Name: Dawn Peters MRN: [id number] DOB: May 29, 1980 Today's Date: 03/15/2018    History of Present Illness Pt admitted with exacerbation of MS and with hx of related L LE weakness as well as B THR, CKD and chronic pain   Clinical Impression   This 38 y/o female presents with the above. At baseline pt reports she is typically independent with ADL and functional mobility. Pt presenting with increased weakness, decreased standing balance impacting her functional performance. Pt currently requires minA for sit<>stand at [facility] and to maintain static standing for brief time period. She currently requires minguard assist for seated UB ADL, modA for LB ADL. Pt tearful end of this session regarding current status and increased need for assist, providing emotional support PRN throughout. She reports will have 24hr supervision/assist from family at time of discharge. Pt will benefit from continued acute OT services and recommend follow up [facility] therapy services at time of discharge to maximize her overall safety and independence with ADL and mobility. Will follow.     Follow Up Recommendations  Home health OT;Supervision/Assistance - 24 hour    Equipment Recommendations  3 in 1 bedside commode;Tub/shower bench           Precautions / Restrictions Precautions Precautions: Fall Restrictions Weight Bearing Restrictions: No      Mobility Bed Mobility               General bed mobility comments: OOB in recliner upon arrival  Transfers Overall transfer level: Needs assistance Equipment used: Rolling walker (2 wheeled) Transfers: Sit to/from Stand Sit to Stand: Min assist         General transfer comment: cues for use of UEs to self assist; minA to boost into standing and stabilize at RW    Balance Overall balance assessment: Needs assistance Sitting-balance support: No upper extremity supported;Feet supported Sitting  balance-Leahy Scale: Normal     Standing balance support: Bilateral upper extremity supported Standing balance-Leahy Scale: Poor                             ADL either performed or assessed with clinical judgement   ADL Overall ADL's : Needs assistance/impaired Eating/Feeding: Modified independent;Sitting   Grooming: Set up;Sitting   Upper Body Bathing: Min guard;Sitting   Lower Body Bathing: Minimal assistance;Sit to/from stand   Upper Body Dressing : Min guard;Set up;Sitting   Lower Body Dressing: Moderate assistance;Sit to/from stand Lower Body Dressing Details (indicate cue type and reason): minA standing balance      Toileting- Clothing Manipulation and Hygiene: Moderate assistance;Sit to/from stand       Functional mobility during ADLs: Minimal assistance;Rolling walker(sit<>stand) General ADL Comments: educated in general safety, energy conservation and activity progression, general strengthening with use of theraband as pt with theraband in room, pt reports x3 falls since this hospital admit; pt tearful end of session regarding current status, providing emotional support as needed                         Pertinent Vitals/Pain Pain Assessment: Faces Faces Pain Scale: Hurts little more Pain Location: chronic back pain Pain Descriptors / Indicators: Aching;Sore Pain Intervention(s): Limited activity within patient's tolerance;Monitored during session     Hand Dominance Right   Extremity/Trunk Assessment Upper Extremity Assessment Upper Extremity Assessment: Overall WFL for tasks assessed   Lower Extremity Assessment Lower Extremity Assessment: Defer to PT evaluation  Communication Communication Communication: No difficulties   Cognition Arousal/Alertness: Awake/alert Behavior During Therapy: WFL for tasks assessed/performed Overall Cognitive Status: Within Functional Limits for tasks assessed                                      General Comments       Exercises Exercises: Other exercises Other Exercises Other Exercises: instructed in use of theraband for UB exercises across multiple planes    Shoulder Instructions      Home Living Family/patient expects to be discharged to:: Private residence Living Arrangements: Spouse/significant other;Children Available Help at Discharge: Family Type of Home: House Home Access: Stairs to enter Technical brewer of Steps: 2 Entrance Stairs-Rails: Left;Right Home Layout: Able to live on main level with bedroom/bathroom     Bathroom Shower/Tub: Teacher, early years/pre: Standard     Home Equipment: Environmental consultant - 4 wheels;Kasandra Knudsen - single point   Additional Comments: LIves with finace' and son       Prior Functioning/Environment Level of Independence: Independent        Comments: Pt was in SNF for 3 months last year with MS exac        OT Problem List: Decreased strength;Decreased range of motion;Decreased activity tolerance;Impaired balance (sitting and/or standing);Obesity      OT Treatment/Interventions: Self-care/ADL training;Therapeutic exercise;Energy conservation;DME and/or AE instruction;Therapeutic activities;Patient/family education;Balance training    OT Goals(Current goals can be found in the care plan section) Acute Rehab OT Goals Patient Stated Goal: Regain IND OT Goal Formulation: With patient Time For Goal Achievement: 03/29/18 Potential to Achieve Goals: Good  OT Frequency: Min 2X/week   Barriers to D/C:            Co-evaluation              AM-PAC OT "6 Clicks" Daily Activity     Outcome Measure Help from another person eating meals?: None Help from another person taking care of personal grooming?: None(in sitting) Help from another person toileting, which includes using toliet, bedpan, or urinal?: A Little Help from another person bathing (including washing, rinsing, drying)?: A Little Help from  another person to put on and taking off regular upper body clothing?: None Help from another person to put on and taking off regular lower body clothing?: A Lot 6 Click Score: 20   End of Session Equipment Utilized During Treatment: Rolling walker Nurse Communication: Mobility status  Activity Tolerance: Patient tolerated treatment well Patient left: in chair;with call bell/phone within reach;with chair alarm set;with family/visitor present  OT Visit Diagnosis: Muscle weakness (generalized) (M62.81);History of falling (Z91.81)                Time: 2993-7169 OT Time Calculation (min): 21 min Charges:  OT General Charges $OT Visit: 1 Visit OT Evaluation $OT Eval Moderate Complexity: 1 Mod  Lou Cal, OT [employer] Pager [phone] Office [phone]   Raymondo Band 03/15/2018, 1:24 PM

## 2018-03-15 NOTE — Progress Notes (Signed)
   03/15/18 1146  What Happened  Was fall witnessed? No  Was patient injured? No  Patient found in bathroom  Found by Staff-comment Jonn Shingles)  Stated prior activity other (comment) (bathing)  Follow Up  MD notified Florencia Reasons  Time MD notified [phone]  Family notified Yes-comment (husband at bedside)  Time family notified [phone]  Additional tests No  Progress note created (see row info) Yes  Adult Fall Risk Assessment  Risk Factor Category (scoring not indicated) Fall has occurred during this admission (document High fall risk)  Patient Fall Risk Level High fall risk  Pain Assessment  Pain Scale 0-10  Pain Score 5  Pain Type Chronic pain  Pain Location Back  Pain Radiating Towards left leg  Pain Descriptors / Indicators Constant  Pain Frequency Constant  Pain Intervention(s) Emotional support  Multiple Pain Sites No

## 2018-03-15 NOTE — Discharge Summary (Signed)
Discharge Summary  Dawn Peters PJK:[id number] DOB: [birth year]  PCP: System, Pcp Not In  Admit date: 03/13/2018 Discharge date: 03/15/2018  Time spent: 40mins, more than 50% time spent on coordination of care.  Recommendations for Outpatient Follow-up:  1. F/u with PCP within a week  for hospital discharge follow up, repeat cbc/bmp at follow up 2. F/u with neurology Dr Felecia Shelling on 1/22 3. Patient declined home health, she prefer to continue outpatient rehab, wheelchair provided  Discharge Diagnoses:  Active Hospital Problems   Diagnosis Date Noted  . Multiple sclerosis exacerbation ([place]) 07/17/2015  . MS (multiple sclerosis) ([place]) 03/14/2018  . CKD (chronic kidney disease), stage III ([place])   . Obesity (BMI 30.0-34.9)   . Pulmonary embolism ([place]) 09/18/2017  . Mild renal insufficiency 08/13/2016  . Urinary hesitancy 07/28/2015  . Depression with anxiety 05/13/2015  . Essential hypertension 02/03/2006    Resolved Hospital Problems  No resolved problems to display.    Discharge Condition: stable  Diet recommendation: heart healthy  Filed Weights   03/13/18 0009 03/13/18 2027  Weight: 99.2 kg 99.2 kg    History of present illness: (per admitting MD Dr Baron Hamper) Dawn Peters YKD:[id number] DOB: 1980/08/15 DOA: 03/13/2018  PCP: System, Pcp Not In  Patient coming from: Home  I have personally briefly reviewed patient's old medical records in [place]  Chief Complaint: Left leg weakness and numbness  HPI: Dawn Peters is a 38 y.o. female with medical history significant of multiple sclerosis chronic kidney disease history of PE presents with left lower leg weakness and numbness.  Is been ongoing for about 2 weeks.  She states she did get a dose of steroids at the neurologist office at some point but a continue to progress that she is been unable to walk normally.  She was in the ER quite some time as MRI machine was down.   MRIs of her brain cervical and thoracic spine were done and appeared nonacute.  She denies any numbness,weakness elsewhere.  She denies any dysphasia or visual changes..  ED Course: ED consulted neurology Dr. Cornelius Moras  who spoke with patient's neurologist Dr. Felecia Shelling and steroids have been ordered.  He will see patient in consultation and suspect medications will be adjusted from there.  Hospital Course:  Principal Problem:   Multiple sclerosis exacerbation ([place]) Active Problems:   Essential hypertension   Depression with anxiety   Urinary hesitancy   Mild renal insufficiency   Pulmonary embolism ([facility])   MS (multiple sclerosis) ([facility])   CKD (chronic kidney disease), stage III ([facility])   Obesity (BMI 30.0-34.9)   MS flares She received iv solumedrol 1 gx3 per neurology recommendation She is continued on her home ms meds dimenthyl fumerate, dalfampridine, Weakness improved, she declined home health PT, she requested a wheelchair, she is to follow up with neurology on 1/22. She prefer continue outpatient rehab, her husband can drive her to her appointment Neurology consulted, input appreciated.  HTN: stable on home meds norvasc/lopressor  CKDIII Cr at baseline Renal dosing meds  H/o DVt/PE on Eliquis  H/o bilateral hip replacement  H/o anxiety/depression Continue home meds  Herpes zoster prophylaxis -Continue Valtrex  History of migraine headaches -Continue imitrex PRN  Chronic pain -Continue home medications including oxycodone, baclofen  H/o left side hydronephrosis s/p stent placement in 11/2017, stent removed  denies flank pain, ua on bacteria, cr at baseline  Obesity: Body mass index is 35.28 kg/m.   Code Status: full  Family Communication: patient  and husband at bedside  Disposition Plan: home with neurology clearance she declined home health  Consultants:  neurology  Procedures:  none  Antibiotics:  none   Discharge Exam: BP  122/73 (BP Location: Right Arm)   Pulse (!) 110   Temp 97.8 F (36.6 C) (Oral)   Resp 18   Ht 5\' 6"  (1.676 m)   Wt 99.2 kg   LMP 03/13/2018   SpO2 99%   BMI 35.28 kg/m   General: NAD Cardiovascular: RRR Respiratory: CTABL  Discharge Instructions You were cared for by a hospitalist during your hospital stay. If you have any questions about your discharge medications or the care you received while you were in the hospital after you are discharged, you can call the unit and asked to speak with the hospitalist on call if the hospitalist that took care of you is not available. Once you are discharged, your primary care physician will handle any further medical issues. Please note that NO REFILLS for any discharge medications will be authorized once you are discharged, as it is imperative that you return to your primary care physician (or establish a relationship with a primary care physician if you do not have one) for your aftercare needs so that they can reassess your need for medications and monitor your lab values.  Discharge Instructions    Diet - low sodium heart healthy   Complete by:  As directed    Increase activity slowly   Complete by:  As directed      Allergies as of 03/15/2018      Reactions   Ace Inhibitors Swelling   Amoxicillin Itching   Lisinopril Swelling   Lower lip swelling      Medication List    STOP taking these medications   methylPREDNISolone 4 MG tablet Commonly known as:  MEDROL   phenazopyridine 200 MG tablet Commonly known as:  PYRIDIUM   pregabalin 225 MG capsule Commonly known as:  LYRICA   traMADol 300 MG 24 hr tablet Commonly known as:  ULTRAM-ER     TAKE these medications   amantadine 100 MG capsule Commonly known as:  SYMMETREL Take 100 mg by mouth 2 (two) times daily.   amitriptyline 25 MG tablet Commonly known as:  ELAVIL TAKE 1-2 TABLETS BY MOUTH AT BEDTIME   amLODipine 5 MG tablet Commonly known as:  NORVASC Take 1 tablet  (5 mg total) by mouth daily. What changed:  when to take this   amphetamine-dextroamphetamine 20 MG 24 hr capsule Commonly known as:  ADDERALL XR Take 1 capsule (20 mg total) by mouth daily.   apixaban 5 MG Tabs tablet Commonly known as:  ELIQUIS Take 1 tablet (5 mg total) by mouth 2 (two) times daily.   baclofen 20 MG tablet Commonly known as:  LIORESAL Take 1 tablet (20 mg total) by mouth 3 (three) times daily as needed (for ms). What changed:  when to take this   busPIRone 15 MG tablet Commonly known as:  BUSPAR Take 1 tablet (15 mg total) by mouth 2 (two) times daily.   citalopram 20 MG tablet Commonly known as:  CELEXA Take 20 mg by mouth every morning.   dalfampridine 10 MG Tb12 Take 1 tablet (10 mg total) by mouth 2 (two) times daily.   gabapentin 800 MG tablet Commonly known as:  NEURONTIN Take 1 tablet (800 mg total) by mouth 3 (three) times daily.   hydrOXYzine 10 MG tablet Commonly known as:  ATARAX/VISTARIL Take 10  mg by mouth 3 (three) times daily as needed for anxiety.   lamoTRIgine 200 MG tablet Commonly known as:  LAMICTAL Take 1 tablet (200 mg total) by mouth 2 (two) times daily.   metoprolol tartrate 25 MG tablet Commonly known as:  LOPRESSOR Take 0.5 tablets (12.5 mg total) by mouth 2 (two) times daily.   multivitamin with minerals Tabs tablet Take 1 tablet by mouth daily.   Oxcarbazepine 300 MG tablet Commonly known as:  TRILEPTAL Take 1 tablet (300 mg total) by mouth 2 (two) times daily.   oxyCODONE 5 MG immediate release tablet Commonly known as:  Oxy IR/ROXICODONE Take 5 mg by mouth every 4 (four) hours as needed for severe pain.   polyethylene glycol packet Commonly known as:  MIRALAX / GLYCOLAX Take 17 g by mouth daily. Start taking on:  March 16, 2018   senna-docusate 8.6-50 MG tablet Commonly known as:  Senokot-S Take 1 tablet by mouth at bedtime.   SUMAtriptan 100 MG tablet Commonly known as:  IMITREX Take 1 tablet (100 mg  total) by mouth once as needed for migraine. May repeat in 2 hours if headache persists or recurs.   tamsulosin 0.4 MG Caps capsule Commonly known as:  FLOMAX Take 1 capsule (0.4 mg total) by mouth daily. What changed:  when to take this   TECFIDERA 240 MG Cpdr Generic drug:  Dimethyl Fumarate Take 240 mg by mouth 2 (two) times daily.   tiZANidine 4 MG tablet Commonly known as:  ZANAFLEX TAKE 1 TABLET BY MOUTH AT BEDTIME   valACYclovir 500 MG tablet Commonly known as:  VALTREX Take 1 tablet (500 mg total) by mouth 2 (two) times daily.            Durable Medical Equipment  (From admission, onward)         Start     Ordered   03/15/18 1140  For home use only DME standard manual wheelchair with seat cushion  Once    Comments:  Patient suffers from multiple sclerosis flares which impairs their ability to perform daily activities like toileting in the home.  A walker will not resolve  issue with performing activities of daily living. A wheelchair will allow patient to safely perform daily activities. Patient can safely propel the wheelchair in the home or has a caregiver who can provide assistance.  Accessories: elevating leg rests (ELRs), wheel locks, extensions and anti-tippers.   03/15/18 1140         Allergies  Allergen Reactions  . Ace Inhibitors Swelling  . Amoxicillin Itching  . Lisinopril Swelling    Lower lip swelling   Follow-up Information    Britt Bottom, MD Follow up on 03/28/2018.   Specialty:  Neurology Contact information: [address] [place] [address] [phone]            The results of significant diagnostics from this hospitalization (including imaging, microbiology, ancillary and laboratory) are listed below for reference.    Significant Diagnostic Studies: Mr Jeri Cos And Wo Contrast  Result Date: 03/13/2018 CLINICAL DATA:  Left flank pain and tingling. Personal history of multiple sclerosis. One week history of tingling in  the left lower extremity with progressive weakness and spasticity. EXAM: MRI HEAD WITHOUT AND WITH CONTRAST MRI CERVICAL SPINE WITHOUT AND WITH CONTRAST MRI THORACIC  SPINE WITHOUT AND WITH CONTRAST TECHNIQUE: Multiplanar, multiecho pulse sequences of the brain and surrounding structures, and cervical spine, to include the craniocervical junction and cervicothoracic junction, and the ,  were obtained without and with intravenous contrast. CONTRAST:  10 mL Gadavist COMPARISON:  None. FINDINGS: MRI HEAD FINDINGS Brain: The study is moderately degraded by patient motion. Periventricular white matter changes bilaterally are stable. No new lesions are present. There is no enhancement or restricted diffusion. No acute infarct, hemorrhage, or mass lesion is present. The ventricles are of normal size. No significant extraaxial fluid collection is present. Postcontrast images demonstrate no pathologic enhancement. Vascular: Flow is present in the major intracranial arteries. Skull and upper cervical spine: The craniocervical junction is normal. Upper cervical spine is within normal limits. Marrow signal is unremarkable. Sinuses/Orbits: The paranasal sinuses and mastoid air cells are clear. The globes and orbits are within normal limits. MRI CERVICAL SPINE FINDINGS Alignment: AP alignment is anatomic. There is no significant scoliosis. Vertebrae: Normal signal is present. Vertebral body heights alignment are normal. No pathologic enhancement is present. Cord: Normal signal is present in the cervical and upper thoracic spinal cord to the lowest imaged level, T2-3. No pathologic enhancement is present Posterior Fossa, vertebral arteries, paraspinal tissues: Craniocervical junction is normal. Flow is present in the major intracranial arteries. Disc levels: No significant disc disease or stenosis is present within the cervical spine. MRI THORACIC SPINE FINDINGS Alignment: AP alignment is anatomic. Vertebrae: Marrow signal and  vertebral body heights are normal. No pathologic enhancement is present. Cord: Normal signal is present throughout the spinal cord to the conus medullaris which terminates at L1-2. Paraspinal tissues: Left-sided hydronephrosis is again seen. Limited imaging of the abdomen is otherwise unremarkable. Disc levels: No significant focal disc intrusion or stenosis is present. Foramina are patent bilaterally. IMPRESSION: 1. Stable periventricular white matter changes compatible with the given diagnosis of multiple sclerosis. No evidence for disease progression. 2. No acute intracranial abnormality. 3. No significant demyelinating disease within the cervical or thoracic spine. 4. Significant disc disease in the cervical or thoracic spine. 5. Chronic left-sided hydronephrosis. Electronically Signed   By: San Morelle M.D.   On: 03/13/2018 15:57   Mr Cervical Spine W Or Wo Contrast  Result Date: 03/13/2018 CLINICAL DATA:  Left flank pain and tingling. Personal history of multiple sclerosis. One week history of tingling in the left lower extremity with progressive weakness and spasticity. EXAM: MRI HEAD WITHOUT AND WITH CONTRAST MRI CERVICAL SPINE WITHOUT AND WITH CONTRAST MRI THORACIC  SPINE WITHOUT AND WITH CONTRAST TECHNIQUE: Multiplanar, multiecho pulse sequences of the brain and surrounding structures, and cervical spine, to include the craniocervical junction and cervicothoracic junction, and the , were obtained without and with intravenous contrast. CONTRAST:  10 mL Gadavist COMPARISON:  None. FINDINGS: MRI HEAD FINDINGS Brain: The study is moderately degraded by patient motion. Periventricular white matter changes bilaterally are stable. No new lesions are present. There is no enhancement or restricted diffusion. No acute infarct, hemorrhage, or mass lesion is present. The ventricles are of normal size. No significant extraaxial fluid collection is present. Postcontrast images demonstrate no pathologic  enhancement. Vascular: Flow is present in the major intracranial arteries. Skull and upper cervical spine: The craniocervical junction is normal. Upper cervical spine is within normal limits. Marrow signal is unremarkable. Sinuses/Orbits: The paranasal sinuses and mastoid air cells are clear. The globes and orbits are within normal limits. MRI CERVICAL SPINE FINDINGS Alignment: AP alignment is anatomic. There is no significant scoliosis. Vertebrae: Normal signal is present. Vertebral body heights alignment are normal. No pathologic enhancement is present. Cord: Normal signal is present in the cervical and upper thoracic spinal cord to  the lowest imaged level, T2-3. No pathologic enhancement is present Posterior Fossa, vertebral arteries, paraspinal tissues: Craniocervical junction is normal. Flow is present in the major intracranial arteries. Disc levels: No significant disc disease or stenosis is present within the cervical spine. MRI THORACIC SPINE FINDINGS Alignment: AP alignment is anatomic. Vertebrae: Marrow signal and vertebral body heights are normal. No pathologic enhancement is present. Cord: Normal signal is present throughout the spinal cord to the conus medullaris which terminates at L1-2. Paraspinal tissues: Left-sided hydronephrosis is again seen. Limited imaging of the abdomen is otherwise unremarkable. Disc levels: No significant focal disc intrusion or stenosis is present. Foramina are patent bilaterally. IMPRESSION: 1. Stable periventricular white matter changes compatible with the given diagnosis of multiple sclerosis. No evidence for disease progression. 2. No acute intracranial abnormality. 3. No significant demyelinating disease within the cervical or thoracic spine. 4. Significant disc disease in the cervical or thoracic spine. 5. Chronic left-sided hydronephrosis. Electronically Signed   By: San Morelle M.D.   On: 03/13/2018 15:57   Mr Thoracic Spine W Wo Contrast  Result Date:  03/13/2018 CLINICAL DATA:  Left flank pain and tingling. Personal history of multiple sclerosis. One week history of tingling in the left lower extremity with progressive weakness and spasticity. EXAM: MRI HEAD WITHOUT AND WITH CONTRAST MRI CERVICAL SPINE WITHOUT AND WITH CONTRAST MRI THORACIC  SPINE WITHOUT AND WITH CONTRAST TECHNIQUE: Multiplanar, multiecho pulse sequences of the brain and surrounding structures, and cervical spine, to include the craniocervical junction and cervicothoracic junction, and the , were obtained without and with intravenous contrast. CONTRAST:  10 mL Gadavist COMPARISON:  None. FINDINGS: MRI HEAD FINDINGS Brain: The study is moderately degraded by patient motion. Periventricular white matter changes bilaterally are stable. No new lesions are present. There is no enhancement or restricted diffusion. No acute infarct, hemorrhage, or mass lesion is present. The ventricles are of normal size. No significant extraaxial fluid collection is present. Postcontrast images demonstrate no pathologic enhancement. Vascular: Flow is present in the major intracranial arteries. Skull and upper cervical spine: The craniocervical junction is normal. Upper cervical spine is within normal limits. Marrow signal is unremarkable. Sinuses/Orbits: The paranasal sinuses and mastoid air cells are clear. The globes and orbits are within normal limits. MRI CERVICAL SPINE FINDINGS Alignment: AP alignment is anatomic. There is no significant scoliosis. Vertebrae: Normal signal is present. Vertebral body heights alignment are normal. No pathologic enhancement is present. Cord: Normal signal is present in the cervical and upper thoracic spinal cord to the lowest imaged level, T2-3. No pathologic enhancement is present Posterior Fossa, vertebral arteries, paraspinal tissues: Craniocervical junction is normal. Flow is present in the major intracranial arteries. Disc levels: No significant disc disease or stenosis is  present within the cervical spine. MRI THORACIC SPINE FINDINGS Alignment: AP alignment is anatomic. Vertebrae: Marrow signal and vertebral body heights are normal. No pathologic enhancement is present. Cord: Normal signal is present throughout the spinal cord to the conus medullaris which terminates at L1-2. Paraspinal tissues: Left-sided hydronephrosis is again seen. Limited imaging of the abdomen is otherwise unremarkable. Disc levels: No significant focal disc intrusion or stenosis is present. Foramina are patent bilaterally. IMPRESSION: 1. Stable periventricular white matter changes compatible with the given diagnosis of multiple sclerosis. No evidence for disease progression. 2. No acute intracranial abnormality. 3. No significant demyelinating disease within the cervical or thoracic spine. 4. Significant disc disease in the cervical or thoracic spine. 5. Chronic left-sided hydronephrosis. Electronically Signed   By: Harrell Gave  Mattern M.D.   On: 03/13/2018 15:57   Mm 3d Screen Breast Bilateral  Result Date: 03/06/2018 CLINICAL DATA:  Screening. This is the patient's initial baseline mammogram. EXAM: DIGITAL SCREENING BILATERAL MAMMOGRAM WITH TOMO AND CAD COMPARISON:  None. ACR Breast Density Category b: There are scattered areas of fibroglandular density. FINDINGS: There are no findings suspicious for malignancy. Images were processed with CAD. IMPRESSION: No mammographic evidence of malignancy. A result letter of this screening mammogram will be mailed directly to the patient. RECOMMENDATION: Screening mammogram in one year. (Code:SM-B-01Y) BI-RADS CATEGORY  1: Negative. Electronically Signed   By: Evangeline Dakin M.D.   On: 03/06/2018 12:31    Microbiology: No results found for this or any previous visit (from the past 240 hour(s)).   Labs: Basic Metabolic Panel: Recent Labs  Lab 03/13/18 0438 03/15/18 0806  NA 141 140  K 4.1 3.8  CL 107 105  CO2 28 27  GLUCOSE 97 128*  BUN 21* 20   CREATININE 1.43* 1.39*  CALCIUM 8.9 9.3   Liver Function Tests: No results for input(s): AST, ALT, ALKPHOS, BILITOT, PROT, ALBUMIN in the last 168 hours. No results for input(s): LIPASE, AMYLASE in the last 168 hours. No results for input(s): AMMONIA in the last 168 hours. CBC: Recent Labs  Lab 03/13/18 0438 03/15/18 0806  WBC 4.5 12.2*  NEUTROABS 2.8 10.6*  HGB 11.4* 10.7*  HCT 35.8* 33.4*  MCV 107.2* 104.7*  PLT 361 330   Cardiac Enzymes: No results for input(s): CKTOTAL, CKMB, CKMBINDEX, TROPONINI in the last 168 hours. BNP: BNP (last 3 results) No results for input(s): BNP in the last 8760 hours.  ProBNP (last 3 results) No results for input(s): PROBNP in the last 8760 hours.  CBG: No results for input(s): GLUCAP in the last 168 hours.     Signed:  Florencia Reasons MD, PhD  [facility] 03/15/2018, 1:35 PM

## 2018-03-15 NOTE — Progress Notes (Signed)
STROKE TEAM PROGRESS NOTE   SUBJECTIVE (INTERVAL HISTORY) Her boyfriend is sleeping in room. Pt is sitting in chair. Overall her condition is gradually improving. Able to ambulate with walker in room to bathroom. PT/OT recommend [facility] PT/OT but pt would like outpt PT/OT. However, pt stated that she felt weak after working with PT/OT, likely overexertion. Finished 3 day course of steroids.   OBJECTIVE Temp:  [97.8 F (36.6 C)-98.5 F (36.9 C)] 98 F (36.7 C) (01/09 1401) Pulse Rate:  [85-110] 85 (01/09 1401) Resp:  [16-18] 16 (01/09 1401) BP: (106-145)/(72-97) 106/72 (01/09 1401) SpO2:  [99 %-100 %] 100 % (01/09 1401)  No results for input(s): GLUCAP in the last 168 hours. Recent Labs  Lab 03/13/18 0438 03/15/18 0806  NA 141 140  K 4.1 3.8  CL 107 105  CO2 28 27  GLUCOSE 97 128*  BUN 21* 20  CREATININE 1.43* 1.39*  CALCIUM 8.9 9.3   No results for input(s): AST, ALT, ALKPHOS, BILITOT, PROT, ALBUMIN in the last 168 hours. Recent Labs  Lab 03/13/18 0438 03/15/18 0806  WBC 4.5 12.2*  NEUTROABS 2.8 10.6*  HGB 11.4* 10.7*  HCT 35.8* 33.4*  MCV 107.2* 104.7*  PLT 361 330   No results for input(s): CKTOTAL, CKMB, CKMBINDEX, TROPONINI in the last 168 hours. No results for input(s): LABPROT, INR in the last 72 hours. Recent Labs    03/13/18 0438  COLORURINE YELLOW  LABSPEC 1.027  PHURINE 5.0  GLUCOSEU NEGATIVE  HGBUR LARGE*  BILIRUBINUR NEGATIVE  KETONESUR 5*  PROTEINUR NEGATIVE  NITRITE NEGATIVE  LEUKOCYTESUR NEGATIVE       Component Value Date/Time   CHOL 228 (H) 04/20/2017 0047   TRIG 46 04/20/2017 0047   HDL 77 04/20/2017 0047   CHOLHDL 3.0 04/20/2017 0047   VLDL 9 04/20/2017 0047   LDLCALC 142 (H) 04/20/2017 0047   Lab Results  Component Value Date   HGBA1C 5.3 04/20/2017      Component Value Date/Time   LABOPIA NONE DETECTED 04/20/2017 0105   COCAINSCRNUR NONE DETECTED 04/20/2017 0105   LABBENZ NONE DETECTED 04/20/2017 0105   AMPHETMU NONE DETECTED  04/20/2017 0105   THCU NONE DETECTED 04/20/2017 0105   LABBARB NONE DETECTED 04/20/2017 0105    No results for input(s): ETH in the last 168 hours.  I have personally reviewed the radiological images below and agree with the radiology interpretations.  Mr Dawn Peters And Wo Contrast  Result Date: 03/13/2018 CLINICAL DATA:  Left flank pain and tingling. Personal history of multiple sclerosis. One week history of tingling in the left lower extremity with progressive weakness and spasticity. EXAM: MRI HEAD WITHOUT AND WITH CONTRAST MRI CERVICAL SPINE WITHOUT AND WITH CONTRAST MRI THORACIC  SPINE WITHOUT AND WITH CONTRAST TECHNIQUE: Multiplanar, multiecho pulse sequences of the brain and surrounding structures, and cervical spine, to include the craniocervical junction and cervicothoracic junction, and the , were obtained without and with intravenous contrast. CONTRAST:  10 mL Gadavist COMPARISON:  None. FINDINGS: MRI HEAD FINDINGS Brain: The study is moderately degraded by patient motion. Periventricular white matter changes bilaterally are stable. No new lesions are present. There is no enhancement or restricted diffusion. No acute infarct, hemorrhage, or mass lesion is present. The ventricles are of normal size. No significant extraaxial fluid collection is present. Postcontrast images demonstrate no pathologic enhancement. Vascular: Flow is present in the major intracranial arteries. Skull and upper cervical spine: The craniocervical junction is normal. Upper cervical spine is within normal limits. Marrow signal  is unremarkable. Sinuses/Orbits: The paranasal sinuses and mastoid air cells are clear. The globes and orbits are within normal limits. MRI CERVICAL SPINE FINDINGS Alignment: AP alignment is anatomic. There is no significant scoliosis. Vertebrae: Normal signal is present. Vertebral body heights alignment are normal. No pathologic enhancement is present. Cord: Normal signal is present in the cervical and  upper thoracic spinal cord to the lowest imaged level, T2-3. No pathologic enhancement is present Posterior Fossa, vertebral arteries, paraspinal tissues: Craniocervical junction is normal. Flow is present in the major intracranial arteries. Disc levels: No significant disc disease or stenosis is present within the cervical spine. MRI THORACIC SPINE FINDINGS Alignment: AP alignment is anatomic. Vertebrae: Marrow signal and vertebral body heights are normal. No pathologic enhancement is present. Cord: Normal signal is present throughout the spinal cord to the conus medullaris which terminates at L1-2. Paraspinal tissues: Left-sided hydronephrosis is again seen. Limited imaging of the abdomen is otherwise unremarkable. Disc levels: No significant focal disc intrusion or stenosis is present. Foramina are patent bilaterally. IMPRESSION: 1. Stable periventricular white matter changes compatible with the given diagnosis of multiple sclerosis. No evidence for disease progression. 2. No acute intracranial abnormality. 3. No significant demyelinating disease within the cervical or thoracic spine. 4. Significant disc disease in the cervical or thoracic spine. 5. Chronic left-sided hydronephrosis. Electronically Signed   By: San Morelle M.D.   On: 03/13/2018 15:57   Mr Cervical Spine W Or Wo Contrast  Result Date: 03/13/2018 CLINICAL DATA:  Left flank pain and tingling. Personal history of multiple sclerosis. One week history of tingling in the left lower extremity with progressive weakness and spasticity. EXAM: MRI HEAD WITHOUT AND WITH CONTRAST MRI CERVICAL SPINE WITHOUT AND WITH CONTRAST MRI THORACIC  SPINE WITHOUT AND WITH CONTRAST TECHNIQUE: Multiplanar, multiecho pulse sequences of the brain and surrounding structures, and cervical spine, to include the craniocervical junction and cervicothoracic junction, and the , were obtained without and with intravenous contrast. CONTRAST:  10 mL Gadavist COMPARISON:   None. FINDINGS: MRI HEAD FINDINGS Brain: The study is moderately degraded by patient motion. Periventricular white matter changes bilaterally are stable. No new lesions are present. There is no enhancement or restricted diffusion. No acute infarct, hemorrhage, or mass lesion is present. The ventricles are of normal size. No significant extraaxial fluid collection is present. Postcontrast images demonstrate no pathologic enhancement. Vascular: Flow is present in the major intracranial arteries. Skull and upper cervical spine: The craniocervical junction is normal. Upper cervical spine is within normal limits. Marrow signal is unremarkable. Sinuses/Orbits: The paranasal sinuses and mastoid air cells are clear. The globes and orbits are within normal limits. MRI CERVICAL SPINE FINDINGS Alignment: AP alignment is anatomic. There is no significant scoliosis. Vertebrae: Normal signal is present. Vertebral body heights alignment are normal. No pathologic enhancement is present. Cord: Normal signal is present in the cervical and upper thoracic spinal cord to the lowest imaged level, T2-3. No pathologic enhancement is present Posterior Fossa, vertebral arteries, paraspinal tissues: Craniocervical junction is normal. Flow is present in the major intracranial arteries. Disc levels: No significant disc disease or stenosis is present within the cervical spine. MRI THORACIC SPINE FINDINGS Alignment: AP alignment is anatomic. Vertebrae: Marrow signal and vertebral body heights are normal. No pathologic enhancement is present. Cord: Normal signal is present throughout the spinal cord to the conus medullaris which terminates at L1-2. Paraspinal tissues: Left-sided hydronephrosis is again seen. Limited imaging of the abdomen is otherwise unremarkable. Disc levels: No significant focal disc  intrusion or stenosis is present. Foramina are patent bilaterally. IMPRESSION: 1. Stable periventricular white matter changes compatible with the  given diagnosis of multiple sclerosis. No evidence for disease progression. 2. No acute intracranial abnormality. 3. No significant demyelinating disease within the cervical or thoracic spine. 4. Significant disc disease in the cervical or thoracic spine. 5. Chronic left-sided hydronephrosis. Electronically Signed   By: San Morelle M.D.   On: 03/13/2018 15:57   Mr Thoracic Spine W Wo Contrast  Result Date: 03/13/2018 CLINICAL DATA:  Left flank pain and tingling. Personal history of multiple sclerosis. One week history of tingling in the left lower extremity with progressive weakness and spasticity. EXAM: MRI HEAD WITHOUT AND WITH CONTRAST MRI CERVICAL SPINE WITHOUT AND WITH CONTRAST MRI THORACIC  SPINE WITHOUT AND WITH CONTRAST TECHNIQUE: Multiplanar, multiecho pulse sequences of the brain and surrounding structures, and cervical spine, to include the craniocervical junction and cervicothoracic junction, and the , were obtained without and with intravenous contrast. CONTRAST:  10 mL Gadavist COMPARISON:  None. FINDINGS: MRI HEAD FINDINGS Brain: The study is moderately degraded by patient motion. Periventricular white matter changes bilaterally are stable. No new lesions are present. There is no enhancement or restricted diffusion. No acute infarct, hemorrhage, or mass lesion is present. The ventricles are of normal size. No significant extraaxial fluid collection is present. Postcontrast images demonstrate no pathologic enhancement. Vascular: Flow is present in the major intracranial arteries. Skull and upper cervical spine: The craniocervical junction is normal. Upper cervical spine is within normal limits. Marrow signal is unremarkable. Sinuses/Orbits: The paranasal sinuses and mastoid air cells are clear. The globes and orbits are within normal limits. MRI CERVICAL SPINE FINDINGS Alignment: AP alignment is anatomic. There is no significant scoliosis. Vertebrae: Normal signal is present. Vertebral body  heights alignment are normal. No pathologic enhancement is present. Cord: Normal signal is present in the cervical and upper thoracic spinal cord to the lowest imaged level, T2-3. No pathologic enhancement is present Posterior Fossa, vertebral arteries, paraspinal tissues: Craniocervical junction is normal. Flow is present in the major intracranial arteries. Disc levels: No significant disc disease or stenosis is present within the cervical spine. MRI THORACIC SPINE FINDINGS Alignment: AP alignment is anatomic. Vertebrae: Marrow signal and vertebral body heights are normal. No pathologic enhancement is present. Cord: Normal signal is present throughout the spinal cord to the conus medullaris which terminates at L1-2. Paraspinal tissues: Left-sided hydronephrosis is again seen. Limited imaging of the abdomen is otherwise unremarkable. Disc levels: No significant focal disc intrusion or stenosis is present. Foramina are patent bilaterally. IMPRESSION: 1. Stable periventricular white matter changes compatible with the given diagnosis of multiple sclerosis. No evidence for disease progression. 2. No acute intracranial abnormality. 3. No significant demyelinating disease within the cervical or thoracic spine. 4. Significant disc disease in the cervical or thoracic spine. 5. Chronic left-sided hydronephrosis. Electronically Signed   By: San Morelle M.D.   On: 03/13/2018 15:57   Mm 3d Screen Breast Bilateral  Result Date: 03/06/2018 CLINICAL DATA:  Screening. This is the patient's initial baseline mammogram. EXAM: DIGITAL SCREENING BILATERAL MAMMOGRAM WITH TOMO AND CAD COMPARISON:  None. ACR Breast Density Category b: There are scattered areas of fibroglandular density. FINDINGS: There are no findings suspicious for malignancy. Images were processed with CAD. IMPRESSION: No mammographic evidence of malignancy. A result letter of this screening mammogram will be mailed directly to the patient. RECOMMENDATION:  Screening mammogram in one year. (Code:SM-B-01Y) BI-RADS CATEGORY  1: Negative. Electronically Signed  By: Evangeline Dakin M.D.   On: 03/06/2018 12:31     PHYSICAL EXAM  Temp:  [97.8 F (36.6 C)-98.5 F (36.9 C)] 98 F (36.7 C) (01/09 1401) Pulse Rate:  [85-110] 85 (01/09 1401) Resp:  [16-18] 16 (01/09 1401) BP: (106-145)/(72-97) 106/72 (01/09 1401) SpO2:  [99 %-100 %] 100 % (01/09 1401)  General - well nourished, well developed, in no apparent distress.    Ophthalmologic - fundi not visualized due to noncooperation.    Cardiovascular - regular rate and rhythm  Mental Status -  Level of arousal and orientation to time, place, and person were intact. Language including expression, naming, repetition, comprehension, reading, and writing was assessed and found intact. Fund of Knowledge was assessed and was intact.  Cranial Nerves II - XII - II - Vision intact OU. III, IV, VI - Extraocular movements intact. V - Facial sensation intact bilaterally. VII - Facial movement intact bilaterally. VIII - Hearing & vestibular intact bilaterally. X - Palate elevates symmetrically. XI - Chin turning & shoulder shrug intact bilaterally. XII - Tongue protrusion intact.  Motor Strength - The patient's strength was normal in RUE and RLE and LUE. However, LLE proximal 3-/5 and DF 4/5 and PF 5/5 and pronator drift was absent.   Motor Tone & Bulk - Muscle tone was assessed at the neck and appendages and was mildly increased at LLE.  Bulk was normal and fasciculations were absent.   Reflexes - The patient's reflexes were 1+ BUE and 2+ RLE and 3+ LLE and she had no babinski but left sustained ankle clonus.  Sensory - Light touch, temperature/pinprick were assessed and were normal.    Coordination - The patient had normal movements in the hands with no ataxia or dysmetria.  Tremor was absent.  Gait and Station - deferred   ASSESSMENT/PLAN Ms. Dawn Peters is a 38  y.o. 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 She has been following with Dr. 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  Completed 3 day IV solumedrol course  Some improvement of LLE  PT/OT recommend home health PT  OK to d/c from neuro standpoint  Continue to follow up with Dr. Felecia Shelling 03/28/18 3pm   Hx of LE DVT and PE  Continue eliquis  May need to be life long given the LLE weakness  Follow up with PCP  Other Active Problems  Anxiety  Depression  Migraine   Hospital day # 1  Neurology will sign off. Please call with questions. Pt will follow up with Dr. Felecia Shelling at [facility] on 03/28/18. Thanks for the consult.   Rosalin Hawking, MD PhD Stroke Neurology 03/15/2018 2:26 PM    To contact Stroke Continuity provider, please refer to [email or web address] After hours, contact General Neurology

## 2018-03-15 NOTE — Progress Notes (Signed)
Physical Therapy  For a safe D/C to home pt will need a wheelchair and a new 4WW  Patient suffers from B LE weakness with freq falls which impairs their ability to perform daily activities like walking in the home.  A walker alone will not resolve the issues with performing activities of daily living. A wheelchair will allow patient to safely perform daily activities.  The patient can self propel in the home or has a caregiver who can provide assistance.     Rica Koyanagi  PTA Acute  Rehabilitation Services Pager      [phone] Office      [phone]

## 2018-03-15 NOTE — Care Management Note (Signed)
Case Management Note  Patient Details  Name: Dawn Peters MRN: [id number] Date of Birth: 12-22-1980  Subjective/Objective:  Spoke with patient and husband at bedside. Patient declines HHPT, states she is currently going to OPPT and wishes to continue that. She states her RW is broken, she received it from [facility]. She also feels she need a wheelchair. Contacted attending and she agrees.                 Action/Plan: Contacted [facility] for wheelchair and to address walker concerns.   Expected Discharge Date:  03/15/18               Expected Discharge Plan:  OP Rehab  In-House Referral:  NA  Discharge planning Services  CM Consult  Post Acute Care Choice:  Durable Medical Equipment Choice offered to:  Patient, Spouse  DME Arranged:  Wheelchair manual DME Agency:  [place]:  NA [place] Agency:  NA  Status of Service:  Completed, signed off  If discussed at [place] of Stay Meetings, dates discussed:    Additional Comments:  Guadalupe Maple, RN 03/15/2018, 11:40 AM

## 2018-03-15 NOTE — Progress Notes (Signed)
Rica Koyanagi, PT helped patient ambulate into the bathroom. Pt was left by PT in the bathroom to bathe at the sink. Husband at bedside.  Cecille Rubin Kropski come to [employer] , Alejandra to ask her to stay in the room with the patient. PT report to NS included the following: "Pt doesn't want a female to help her wash up, could someone come help her? She's a 38 year old female with MS, she walked to the bathroom fine with me, she does not need the Kam[place]"  Alejandra presents to room to help patient bathe. Patient found at sink with slippers on, using walker, and husband was in the bathroom as well. ,Alejandra  at patient bedsideat this time remaking the bed with fresh linen. Patient states "Im fine in the bathroom" Alejandra steps out for approximately 2 minutes, returns to the room, and finds patient on her knees in the bathroom floor.   From the report given from PT to NS, NS would not have known the patient history or underlying condition, and would not know that patient had fallen this admission by the report that was relayed.   Jon, the patient's assigned NT was given proper report by the off-going NT, and was aware of the patient's high fall risk status as well as 2 falls during this admission.   RN will speak to PT, and inform her of the fall, and improper report that was given to the NS.  > > > > > > Patient denies hitting her head during the fall, states, " I fell from standing at the sink onto my knees"  VS were assessed, as well as patient LOC. See flowsheet for documentation.  MD notified of patient status, and the cause of the fall.   MD requested that I ask the patient and husband if they want HH PT now, and to assess Orthostatic Hypotension.

## 2018-03-15 NOTE — Plan of Care (Signed)
  Problem: Activity: Goal: Risk for activity intolerance will decrease Outcome: Progressing   Problem: Coping: Goal: Level of anxiety will decrease Outcome: Progressing   Problem: Pain Managment: Goal: General experience of comfort will improve Outcome: Progressing   

## 2018-03-16 ENCOUNTER — Telehealth: Payer: Self-pay | Admitting: Physical Therapy

## 2018-03-16 ENCOUNTER — Encounter: Payer: Medicare Other | Admitting: Physical Therapy

## 2018-03-16 DIAGNOSIS — G35 Multiple sclerosis: Secondary | ICD-10-CM | POA: Diagnosis not present

## 2018-03-16 NOTE — Progress Notes (Signed)
   03/16/18 1400  OT Visit Information  Last OT Received On 03/16/18  Assistance Needed +2  History of Present Illness Pt admitted with exacerbation of MS and with hx of related L LE weakness as well as B THR, CKD and chronic pain  Precautions  Precautions Fall  Precaution Comments L LE weakness, MS   Pain Assessment  Pain Assessment No/denies pain  Cognition  Arousal/Alertness Awake/alert  Behavior During Therapy WFL for tasks assessed/performed  Overall Cognitive Status Within Functional Limits for tasks assessed  Upper Extremity Assessment  Upper Extremity Assessment Overall WFL for tasks assessed  ADL  Lower Body Bathing Moderate assistance;Sitting/lateral leans  Lower Body Dressing Maximal assistance;Sitting/lateral leans  General ADL Comments educated on sock aide, which pt used with min A and long sponge  Restrictions  Weight Bearing Restrictions No  General Comments  General comments (skin integrity, edema, etc.) pt up in chair and lunch had just arrived.  needed increased assistance today with PT  Other Exercises  Other Exercises pt verbalizes she is performing theraband on her own  Other Exercises reminded of energy conservation; not getting overfatiqued  OT - End of Session  Activity Tolerance Patient tolerated treatment well  Patient left in chair;with call bell/phone within reach;with chair alarm set;with family/visitor present  OT Assessment/Plan  OT Visit Diagnosis Muscle weakness (generalized) (M62.81);History of falling (Z91.81)  OT Frequency (ACUTE ONLY) Min 2X/week  Follow Up Recommendations Supervision/Assistance - 24 hour;Home health OT  OT Equipment  (drop arm 3:1 commode; long sliding board)  AM-PAC OT "6 Clicks" Daily Activity Outcome Measure (Version 2)  Help from another person eating meals? 4  Help from another person taking care of personal grooming? 3  Help from another person toileting, which includes using toliet, bedpan, or urinal? 2  Help from  another person bathing (including washing, rinsing, drying)? 2  Help from another person to put on and taking off regular upper body clothing? 3  Help from another person to put on and taking off regular lower body clothing? 2  6 Click Score 16  OT Goal Progression  Progress towards OT goals Not progressing toward goals - comment (needs increased assistance today)  OT Time Calculation  OT Start Time (ACUTE ONLY) 1308  OT Stop Time (ACUTE ONLY) 1321  OT Time Calculation (min) 13 min  OT General Charges  $OT Visit 1 Visit  OT Treatments  $Self Care/Home Management  8-22 mins  Lesle Chris, OTR/L Acute Rehabilitation Services [phone] [facility] pager [phone] office 03/16/2018

## 2018-03-16 NOTE — Care Management Obs Status (Signed)
[place] NOTIFICATION   Patient Details  Name: Dawn Peters MRN: [id number] Date of Birth: 20-Jan-1981   Medicare Observation Status Notification Given:  Yes    Guadalupe Maple, RN 03/16/2018, 1:07 PM

## 2018-03-16 NOTE — Telephone Encounter (Signed)
Left VM regarding NS today and advised of next appointment time. Advised of attendance policy. Requested call back if need for RS. Arthea Nobel C. Ajamu Maxon PT, DPT 03/16/18 11:34 AM

## 2018-03-16 NOTE — Care Management CC44 (Signed)
Condition Code 44 Documentation Completed  Patient Details  Name: Dawn Peters MRN: [id number] Date of Birth: 1980/11/11   Condition Code 44 given:  Yes Patient signature on Condition Code 44 notice:  Yes Documentation of 2 MD's agreement:  Yes Code 44 added to claim:  Yes    Guadalupe Maple, RN 03/16/2018, 1:07 PM

## 2018-03-16 NOTE — Progress Notes (Signed)
Physical Therapy Treatment Patient Details Name: Dawn Peters MRN: [id number] DOB: [birth year] Today's Date: 03/16/2018    History of Present Illness Pt admitted with exacerbation of MS and with hx of related L LE weakness as well as B THR, CKD and chronic pain    PT Comments    Pt required increased assist + 2 MAX/Total and was unable to safely amb.   General Gait Details: great difficulty advancing either LE and unable to support self.  Required + 2 side by side MAX/Total Assist to attempt forward gait.  Barely advanced 2 feet just enough to place recliner chair behind.   Significant mobility decline past few days as pt amb 100 feet with PT on 03/14/18   Follow Up Recommendations  Home health PT     Equipment Recommendations  Wheelchair cushion (measurements PT);Wheelchair (measurements PT)    Recommendations for Other Services       Precautions / Restrictions Precautions Precaution Comments: L LE weakness, MS  Restrictions Weight Bearing Restrictions: No    Mobility  Bed Mobility Overal bed mobility: Needs Assistance Bed Mobility: Supine to Sit     Supine to sit: Max assist;Mod assist     General bed mobility comments: required extra assist today with increased difficulty moving B LE and scooting to EOB with noted ataxic mvts   Transfers Overall transfer level: Needs assistance Equipment used: Rolling walker (2 wheeled) Transfers: Sit to/from Stand Sit to Stand: Max assist;Total assist;+2 physical assistance;+2 safety/equipment         General transfer comment: pt required increased assist due to B LE weakness, B hips and knees buckling and spastic movements.  Performed sit to stand twice + 2 side by side assist.  Pt unbale to obtain upright posture.  Poor motor control and near fall buckle.    Ambulation/Gait Ambulation/Gait assistance: Max assist;Total assist;+2 physical assistance;+2 safety/equipment Gait Distance (Feet): 2 Feet Assistive  device: Rolling walker (2 wheeled) Gait Pattern/deviations: Step-to pattern;Decreased step length - left;Decreased step length - right     General Gait Details: great difficulty advancing either LE and unable to support self.  Required + 2 side by side MAX/Total Assist to attempt forward gait.  Barely advanced 2 feet just enough to place recliner chair behind.     Stairs             Wheelchair Mobility    Modified Rankin (Stroke Patients Only)       Balance                                            Cognition Arousal/Alertness: Awake/alert Behavior During Therapy: WFL for tasks assessed/performed Overall Cognitive Status: Within Functional Limits for tasks assessed                                        Exercises      General Comments        Pertinent Vitals/Pain Pain Assessment: No/denies pain    Home Living                      Prior Function            PT Goals (current goals can now be found in the care plan section) Progress towards PT goals:  Progressing toward goals    Frequency    Min 3X/week      PT Plan Current plan remains appropriate    Co-evaluation              AM-PAC PT "6 Clicks" Mobility   Outcome Measure  Help needed turning from your back to your side while in a flat bed without using bedrails?: A Lot Help needed moving from lying on your back to sitting on the side of a flat bed without using bedrails?: A Lot Help needed moving to and from a bed to a chair (including a wheelchair)?: A Lot Help needed standing up from a chair using your arms (e.g., wheelchair or bedside chair)?: A Lot Help needed to walk in hospital room?: Total Help needed climbing 3-5 steps with a railing? : Total 6 Click Score: 10    End of Session Equipment Utilized During Treatment: Gait belt   Patient left: in chair;with chair alarm set;with family/visitor present Nurse Communication: Mobility  status PT Visit Diagnosis: Difficulty in walking, not elsewhere classified (R26.2);Muscle weakness (generalized) (M62.81)     Time: 3875-6433 PT Time Calculation (min) (ACUTE ONLY): 26 min  Charges:  $Gait Training: 8-22 mins $Therapeutic Activity: 8-22 mins                     Rica Koyanagi  PTA Acute  Rehabilitation Services Pager      [phone] Office      [phone]

## 2018-03-16 NOTE — Progress Notes (Signed)
   03/16/18 1500  OT Visit Information  Last OT Received On 03/16/18  Assistance Needed +2  History of Present Illness Pt admitted with exacerbation of MS and with hx of related L LE weakness as well as B THR, CKD and chronic pain  Precautions  Precautions Fall  Precaution Comments L LE weakness, MS; knees buckle  Pain Assessment  Pain Assessment Faces  Faces Pain Scale 4  Pain Location chronic back pain  Pain Descriptors / Indicators Aching;Sore  Pain Intervention(s) Limited activity within patient's tolerance;Monitored during session;Repositioned  Cognition  Arousal/Alertness Awake/alert  Behavior During Therapy WFL for tasks assessed/performed  Overall Cognitive Status Within Functional Limits for tasks assessed  ADL  General ADL Comments delivered rest of AE for pt:  reacher, leg lifter, and long shoehorn. Reacher and long sponge was delivered earlier.   Long sliding board was delivered to room. Finance verbalizes understanding of how to use this.  Used for sliding board transfer to w/c to simulate car transfer.  Pt performed with A to place and remove sliding board.  Pt performed sliding portion with min guard for safety and cues for hand placement as she tended to try to place fingers under board.    Restrictions  Weight Bearing Restrictions No  Transfers   Lateral/Scoot Transfers Min assist;With slide board  General transfer comment Assist to place and remove board; pt performed scooting along board at min guard level  OT - End of Session  Activity Tolerance Patient tolerated treatment well  Patient left  (in w/c with family and RN in room)  OT Assessment/Plan  OT Visit Diagnosis Muscle weakness (generalized) (M62.81);History of falling (Z91.81)  OT Frequency (ACUTE ONLY) Min 2X/week  Follow Up Recommendations Supervision/Assistance - 24 hour;Home health OT  OT Equipment Tub/shower bench (drop arm commode and long sliding board)  AM-PAC OT "6 Clicks" Daily Activity Outcome  Measure (Version 2)  Help from another person eating meals? 4  Help from another person taking care of personal grooming? 3  Help from another person toileting, which includes using toliet, bedpan, or urinal? 2  Help from another person bathing (including washing, rinsing, drying)? 2  Help from another person to put on and taking off regular upper body clothing? 3  Help from another person to put on and taking off regular lower body clothing? 2  6 Click Score 16  OT Goal Progression  Progress towards OT goals Not progressing toward goals - comment (if pt remains in hospital, will downgrade goals)  OT Time Calculation  OT Start Time (ACUTE ONLY) 1330  OT Stop Time (ACUTE ONLY) 1344  OT Time Calculation (min) 14 min  OT General Charges  $OT Visit 1 Visit  OT Treatments  $Self Care/Home Management  8-22 mins  Lesle Chris, OTR/L Acute Rehabilitation Services [phone] [facility] pager [phone] office 03/16/2018

## 2018-03-16 NOTE — Plan of Care (Signed)
  Problem: Clinical Measurements: Goal: Diagnostic test results will improve Outcome: Progressing   Problem: Activity: Goal: Risk for activity intolerance will decrease Outcome: Progressing   Problem: Coping: Goal: Level of anxiety will decrease Outcome: Progressing   Problem: Elimination: Goal: Will not experience complications related to bowel motility Outcome: Progressing   Problem: Pain Managment: Goal: General experience of comfort will improve Outcome: Progressing   Problem: Safety: Goal: Ability to remain free from injury will improve Outcome: Progressing   

## 2018-03-16 NOTE — Care Management Important Message (Signed)
Important Message  Patient Details  Name: Dawn Peters MRN: [id number] Date of Birth: 06-26-1980   Medicare Important Message Given:  Yes    Kerin Salen 03/16/2018, 1:21 PMImportant Message  Patient Details  Name: Dawn Peters MRN: [id number] Date of Birth: 1981/01/08   Medicare Important Message Given:  Yes    Kerin Salen 03/16/2018, 1:21 PM

## 2018-03-16 NOTE — Progress Notes (Addendum)
   03/16/18 1452  OT Visit Information  Last OT Received On 03/16/18  Assistance Needed +2  PT/OT/SLP Co-Evaluation/Treatment Yes  Reason for Co-Treatment For patient/therapist safety  PT goals addressed during session Mobility/safety with mobility  OT goals addressed during session ADL's and self-care  History of Present Illness Pt admitted with exacerbation of MS and with hx of related L LE weakness as well as B THR, CKD and chronic pain  Precautions  Precautions Fall  Precaution Comments L LE weakness, MS; knees buckle  Pain Assessment  Pain Assessment Faces  Faces Pain Scale 6  Pain Location chronic back pain  Pain Descriptors / Indicators Aching;Sore  Pain Intervention(s) Limited activity within patient's tolerance;Premedicated before session  Cognition  Arousal/Alertness Awake/alert  Behavior During Therapy [facility] for tasks assessed/performed  Overall Cognitive Status Within Functional Limits for tasks assessed  ADL  Toilet Transfer Maximal assistance;+2 for physical assistance;+2 for safety/equipment;Stand-pivot;BSC (2 person hand held assistance)  Toileting- Clothing Manipulation and Hygiene Maximal assistance;+2 for physical assistance;+2 for safety/equipment;Sit to/from stand  General ADL Comments underwear and pants are tight:  multiple push ups to pull over hips.  Transfers  Sit to Stand Max assist;+2 physical assistance;+2 safety/equipment  Stand pivot transfers Mod assist;Max assist;+2 physical assistance;From elevated surface   Lateral/Scoot Transfers Min assist;+2 physical assistance;+2 safety/equipment  General transfer comment performed SPT to [facility] then chair; lateral scoot from drop arm recliner to w/c.  Min A to block foot, cues for sequence and light steadying assistance  OT - End of Session  Activity Tolerance Patient limited by fatigue  Patient left in chair;with call bell/phone within reach;with chair alarm set;with family/visitor present  OT Assessment/Plan   Follow Up Recommendations Supervision/Assistance - 24 hour;Home health OT  OT Equipment  (drop arm 3:1 and long sliding board)  AM-PAC OT "6 Clicks" Daily Activity Outcome Measure (Version 2)  Help from another person eating meals? 4  Help from another person taking care of personal grooming? 3  Help from another person toileting, which includes using toliet, bedpan, or urinal? 2  Help from another person bathing (including washing, rinsing, drying)? 2  Help from another person to put on and taking off regular upper body clothing? 3  Help from another person to put on and taking off regular lower body clothing? 2  6 Click Score 16  OT Goal Progression  Progress towards OT goals Not progressing toward goals - comment  OT Time Calculation  OT Start Time (ACUTE ONLY) 1359  OT Stop Time (ACUTE ONLY) 1428  OT Time Calculation (min) 21 min  OT General Charges  $OT Visit 1 Visit (1 [place])  Lesle Chris, OTR/L Acute Rehabilitation Services [phone] [facility] pager [phone] office 03/16/2018

## 2018-03-16 NOTE — Progress Notes (Signed)
Physical Therapy Treatment Patient Details Name: Zamora Colton [place] MRN: [id number] DOB: 24-Jan-1981 Today's Date: 03/16/2018    History of Present Illness Pt admitted with exacerbation of MS and with hx of related L LE weakness as well as B THR, CKD and chronic pain    PT Comments    Co Tx with OT Assisted with transfers and attempted gait however pt was unable to stand erect, unable to advance either LE and unable to support her weight.    Follow Up Recommendations  Home health PT     Equipment Recommendations  Wheelchair cushion (measurements PT);Wheelchair (measurements PT)    Recommendations for Other Services       Precautions / Restrictions Precautions Precautions: Fall Precaution Comments: L LE weakness, MS; knees buckle Restrictions Weight Bearing Restrictions: No    Mobility  Bed Mobility Overal bed mobility: Needs Assistance Bed Mobility: Supine to Sit     Supine to sit: Max assist;Mod assist     General bed mobility comments: OOB in recliner   Transfers Overall transfer level: Needs assistance Equipment used: Rolling walker (2 wheeled) Transfers: Sit to/from Stand Sit to Stand: Max assist;+2 physical assistance;+2 safety/equipment Stand pivot transfers: Mod assist;Max assist;+2 physical assistance;From elevated surface      Lateral/Scoot Transfers: Min assist;With slide board General transfer comment: Assist to place and remove board; pt performed scooting along board at min guard level  Ambulation/Gait Ambulation/Gait assistance: Max assist;Total assist;+2 physical assistance;+2 safety/equipment Gait Distance (Feet): 2 Feet Assistive device: Rolling walker (2 wheeled) Gait Pattern/deviations: Step-to pattern;Decreased step length - left;Decreased step length - right     General Gait Details: unable to advance either LE and unable to support her own weight    Stairs             Wheelchair Mobility    Modified Rankin (Stroke  Patients Only)       Balance                                            Cognition Arousal/Alertness: Awake/alert Behavior During Therapy: WFL for tasks assessed/performed Overall Cognitive Status: Within Functional Limits for tasks assessed                                        Exercises Other Exercises Other Exercises: pt verbalizes she is performing theraband on her own Other Exercises: reminded of energy conservation; not getting overfatiqued    General Comments General comments (skin integrity, edema, etc.): pt up in chair and lunch had just arrived.  needed increased assistance today with PT      Pertinent Vitals/Pain Pain Assessment: Faces Faces Pain Scale: Hurts a little bit Pain Location: chronic back pain Pain Descriptors / Indicators: Aching;Sore Pain Intervention(s): Monitored during session    Home Living                      Prior Function            PT Goals (current goals can now be found in the care plan section) Progress towards PT goals: Progressing toward goals    Frequency    Min 3X/week      PT Plan Current plan remains appropriate    Co-evaluation   Reason for Co-Treatment: For patient/therapist safety  PT goals addressed during session: Mobility/safety with mobility OT goals addressed during session: ADL's and self-care      AM-PAC PT "6 Clicks" Mobility   Outcome Measure  Help needed turning from your back to your side while in a flat bed without using bedrails?: A Lot Help needed moving from lying on your back to sitting on the side of a flat bed without using bedrails?: A Lot Help needed moving to and from a bed to a chair (including a wheelchair)?: A Lot Help needed standing up from a chair using your arms (e.g., wheelchair or bedside chair)?: A Lot Help needed to walk in hospital room?: A Lot Help needed climbing 3-5 steps with a railing? : Total 6 Click Score: 11    End of  Session Equipment Utilized During Treatment: Gait belt Activity Tolerance: Patient tolerated treatment well Patient left: in chair;with chair alarm set;with family/visitor present Nurse Communication: Mobility status PT Visit Diagnosis: Difficulty in walking, not elsewhere classified (R26.2);Muscle weakness (generalized) (M62.81)     Time: 8315-1761 PT Time Calculation (min) (ACUTE ONLY): 28 min  Charges:   $Self Care/Home Management: [place]  PTA Acute  Rehabilitation Services Pager      [phone] Office      [phone]

## 2018-03-17 NOTE — Progress Notes (Signed)
There is a discharge order for 1/9 (yesterday) unsure of why patient is still here or why order was not removed. MD paged and aware of situation as patient was seen and had difficulty with P.T. and O.T.  We ordered equiptment for patient safety and encouraged HHPT. Patient was given discharge paper work and told to follow up with her neurologist and urologist.

## 2018-03-19 ENCOUNTER — Telehealth: Payer: Self-pay | Admitting: Physical Therapy

## 2018-03-19 ENCOUNTER — Encounter: Payer: Medicare Other | Admitting: Physical Therapy

## 2018-03-19 NOTE — Telephone Encounter (Signed)
Called patient regarding 2nd consecutive no-show appointment. Therapy left voicemail. Patient recently admitted to the hospital. Therapy will cancel remaining visits until the patient calls back. She will be put on hold. When she calls back we can start treatment again.

## 2018-03-21 ENCOUNTER — Ambulatory Visit: Payer: Medicare Other | Admitting: Physical Therapy

## 2018-03-25 ENCOUNTER — Other Ambulatory Visit: Payer: Self-pay | Admitting: Neurology

## 2018-03-26 ENCOUNTER — Encounter: Payer: Medicare Other | Admitting: Physical Therapy

## 2018-03-26 ENCOUNTER — Other Ambulatory Visit: Payer: Self-pay | Admitting: Neurology

## 2018-03-26 MED ORDER — AMPHETAMINE-DEXTROAMPHET ER 20 MG PO CP24: 20.0000 mg | ORAL_CAPSULE | Freq: Every day | ORAL | 0 refills | Status: DC

## 2018-03-26 NOTE — Telephone Encounter (Signed)
Pt is needing a refill on her amphetamine-dextroamphetamine (ADDERALL XR) 20 MG 24 hr capsule sent to the [facility] on [address]. °

## 2018-03-28 ENCOUNTER — Telehealth: Payer: Self-pay | Admitting: *Deleted

## 2018-03-28 ENCOUNTER — Encounter

## 2018-03-28 ENCOUNTER — Other Ambulatory Visit: Payer: Self-pay

## 2018-03-28 ENCOUNTER — Ambulatory Visit (INDEPENDENT_AMBULATORY_CARE_PROVIDER_SITE_OTHER): Payer: Medicare Other | Admitting: Neurology

## 2018-03-28 ENCOUNTER — Encounter: Payer: Self-pay | Admitting: Physical Therapy

## 2018-03-28 ENCOUNTER — Ambulatory Visit: Payer: Medicare Other | Admitting: Physical Therapy

## 2018-03-28 ENCOUNTER — Encounter: Payer: Self-pay | Admitting: Neurology

## 2018-03-28 VITALS — BP 134/85 | HR 118 | Ht 66.0 in | Wt 213.5 lb

## 2018-03-28 DIAGNOSIS — M62830 Muscle spasm of back: Secondary | ICD-10-CM

## 2018-03-28 DIAGNOSIS — G35 Multiple sclerosis: Secondary | ICD-10-CM | POA: Diagnosis not present

## 2018-03-28 DIAGNOSIS — M545 Low back pain: Secondary | ICD-10-CM | POA: Diagnosis not present

## 2018-03-28 DIAGNOSIS — R41840 Attention and concentration deficit: Secondary | ICD-10-CM

## 2018-03-28 DIAGNOSIS — R200 Anesthesia of skin: Secondary | ICD-10-CM

## 2018-03-28 DIAGNOSIS — Z79899 Other long term (current) drug therapy: Secondary | ICD-10-CM | POA: Diagnosis not present

## 2018-03-28 DIAGNOSIS — R29898 Other symptoms and signs involving the musculoskeletal system: Secondary | ICD-10-CM | POA: Diagnosis not present

## 2018-03-28 DIAGNOSIS — N1330 Unspecified hydronephrosis: Secondary | ICD-10-CM | POA: Diagnosis not present

## 2018-03-28 DIAGNOSIS — R269 Unspecified abnormalities of gait and mobility: Secondary | ICD-10-CM

## 2018-03-28 DIAGNOSIS — G8929 Other chronic pain: Secondary | ICD-10-CM

## 2018-03-28 NOTE — Progress Notes (Signed)
Some   [facility]  PATIENT: Dawn Peters DOB: December 20, 1980  REFERRING DOCTOR OR PCP:  Dr. Annitta Needs SOURCE: Patient, records in EMR, lab results and radiology reports in EMR, MRI images reviewed on PACS.  _________________________________   HISTORICAL  CHIEF COMPLAINT:  Chief Complaint  Patient presents with  . Follow-up    RM 13. Last seen 10/19/2017. Was in the hospital from 03/13/2018-03/26/2018. Received IV steroids there. Still having left leg weakness and off balance while walking after being discharged  . Multiple Sclerosis    On Tecfidera. She would like to discuss changing her DMT.    HISTORY OF PRESENT ILLNESS:   Dawn Peters is a 38 y.o. woman who was diagnosed with MS in 2000.     Update 03/28/2018: She went to [facility] emergency room 03/13/2018 due to more left-sided symptoms.  While there, she had an MRI of the brain and cervical spine.  I looked at the actual MRI images and the reports.  She has stable periventricular white matter changes consistent with MS without evidence of disease progression when compared to her previous MRI.  There were no new spinal cord lesions noted.  Hydroureternephrosis on the left was again demonstrated (she has seen Dr. Louis Meckel about this in the past).  I was contacted by Dr. Erlinda Hong during that emergency room visit.  Although an MS exacerbation was unlikely (she has multiple other emergency room visits for identical symptoms), in the past she has felt symptom relief with IV steroids so a 1000 mg dose was ordered x 3 days.    She feels her left leg is better but still weak and she is tripping more.     She started PT.   They are recommending a new brace that she can sleep in.    She is on baclofen 20 mg up to 4 times a day.    Ampyra helped her walking.     I personally reviewed notes, labs and imaging reports from her recent [facility] emergency room and hospital admission and personally reviewed the MRI images  and compare them to her previous scans  She would like ot switch off the Tecfidera.  In the past, we had discussed Ocrevus as another option.   She had her [place] courses in 2017 and 2018.  She has had some elevation of her creatinine.   This is likely related to her hydronephrosis on the left and she states she has 10% kidney function on that side and 40-50% on the other side.    Update 10/19/2017: She switched over to Tecfidera earlier this year.  She tolerates it well.     She is noting more weakness and spasticity in the left leg.  Balance is poor.  She needs to use a cane.  In the past, she has felt better when she received a couple days of IV steroids.  She notes a little bit more spasticity in the legs.   She takes baclofen and tizanidine 4 times a day and is unsure how much they are helping.     She is noting more leg pain.   She felt leg pain did better on gabapentin than on Lyrica.  She also notes more fatigue the last couple weeks.  She is sleeping well most nights.  She has some urinary frequency but feels this is stable.  She is still on Eliquis.  She had an Ultrasound to check up on the clot this morning.   Her migraine headaches  are doing well  Update 09/20/2017: She had two cycles of Lemtrada (2017 and 2018) and switched to Tecfidera a few months ago as she was still having progression (has active SPMS) and  She has left sided leg weakness and spasticity and has reduced gait.     She uses a cane.     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     She has some fatigue but this is stable.    She sleeps well most nights.    Migraines are doing well.     Her creatinine has ben mildly elevated 1.3 to 1.4 but stable   Anti-GBM Ab was fine.  Update 06/21/2017: She has an active form of SPMS.  She re-presented with worsening left sided weakness and more trouble walking a few days  after her last visit.  Imaging studies did not show any new lesions.  She was given IV solu-medrol but did not feel she got any better.   She then was d/c to [facility] for Rehab.     Her last Lemtrada was 10/10/2016-10/12/16.      She did not follow up with nephrology appointment scheduled for March.   Creatinine has been increased.   During last admission, she was evaluated by medicine and received IV fluids and creatinine improved to 1.39-1.41.  One year ago creatinine was 0.9.      Patients with [name] can have anti-glomerular membrane antibodies and this was checked 05/17/2017 and was normal.   There also have been a few cases of other glomerulonephropathy seen in clinical studies and post-marketing data.     She is not on any NSAID's.   Lab and imaging reports, MRI images on PACS and admission discharge notes from her recent admission were reviewed  Update 05/17/2017:   She had a second course of Lemtrada in August 2018.   She notes her left leg is worse.  It is weaker and she is falling more.  She was in the hospital last month due to hypersomnia and generalized weakness. No definite etiology was determined. Drug screen was negative. Urinalysis showed small amount of hemoglobin but no protein or casts.   Repeat brain MRI showed no new lesions.   She received 3 days of IV Solu-Medrol.  Admission and discharge notes were reviewed.Her creatinine has been elevated with the last reading 1.52.    She is scheduled to see nephrology next week.  She also has apparent hydronephrosis on MRI and I referred her to urology last October but she does not recall going.   She is on Ampyra and feels it helps her strength some.     Update 12/28/2016:   She had her 3 day course of Lemtrada in August. She tolerated it well. She has not had any exacerbations while on Lemtrada for the MS. Her last MRI was in June 2018 and did not show any new lesions. She continues to have difficulty with her gait due to the left leg weakness  and spasticity. An AFO initially helped her walk but she is having trouble with fit that she believes is worse due to weight gain. Balance is also mildly off. Spasticity is predominantly in the left leg she gets some benefit with baclofen and tizanidine.    She has painful dysesthesias of the left leg  helped partially by gabapentin and lamotrigine. She also sees pain management for opiates. She has worsening urinary hesitancy this year and an MRI of the thoracic spine showed hydronephrosis. She has fatigue is both physical and cognitive. It is worse in the heat so she feels it is doing better than a couple months ago. Well most nights. Mood has done better with citalopram and BuSpar.  ' From 10/24/2016: MS:   She had her 3 day course of Lemtrada in the office a couple weeks ago and thee infusions went well. . She is JCV high titer positive and had switched from Tysabri to [place] earlier.     Gait/strength:    Gait has worsened due mostly to the left leg weakness and spasticity over the last year. She got her left AFO and is starting to get used to it.   She has trouble lifting the left leg/foot.  Ba;lance is off.     She is on Ampyra with some benefit.  Arms are doing ok.  She has left sided spasticity, only partially helped by baclofen and tizanidine.  Dysesthesia:   Patient continues to report painful left leg dysesthesias. These are similar to the last visit.  .  She is currently on gabapentin 800 mg by mouth 4 times a day and lamotrigine 200 mg twice a day for the dysesthetic pain.  With her medications and tramadol she feels that the pain is tolerable.   Vision:    She denies any MS related visual problems.  Bladder:   Urinary hesitancy continues to be a problem. She gets some benefit from tamsulosin. MRI of the thoracic spine with recent hospitalization shows hydronephrosis on the left.  She has not yet been referred to urology.  Bowel is fine..  Fatigue/sleep:   She is noting more trouble with  her fatigue, no occurring daily.  . She does especially poorly in the hear.  She denies sleepiness -- issues are more fatigue.   She has never tried a stimulant.   She sleeps well most nights.  Mood:  Her depression and anxiety is better with citalopram 40 mg and buspar bid.    Anxiety is more of a problem than depression and she feels that the medications are not completely controlling this.  Cognition:   She denies significant difficulties with her cognitive abilities.   Focus is mildly reduced.  MS history:   She was diagnosed with MS in 2000 after presenting with gait difficulties, numbness and headaches. An MRI of the brain was consistent with MS. She was then started on Betaseron. She had difficulty tolerating Betaseron and at some point switched to Novantrone. She took Novantrone every 3 months for a year or so. A little later, she was placed on Tysabri and she stayed on it for a couple of years. However, she was JCV-positive and switched off. She did well on Tysabri. She had been on Tecfidera since 2015. Her MRI from June 2015 showed that there had been no changes compared to an MRI from 2014.   However, she had an exacerbation March 2017.   The 08/07/2013 MRI of the brain shows multiple T2/FLAIR hyperintense foci located in the cerebellum, middle cerebellar peduncles, pons and in the periventricular white matter of the hemispheres in a pattern and configuration consistent with chronic demyelinating plaque associated with MS. There were no acute findings. There was no change compared to the previous MRI from 01/16/2013 that was also reviewed. An MRI of the cervical spine shows subtle  T2 hyperintense signal within the cord and there were no acute findings.  REVIEW OF SYSTEMS: Constitutional: No fevers, chills, sweats, or change in appetite.   She has fatigue.  Some insomnia. Eyes: No visual changes, double vision, eye pain Ear, nose and throat: No hearing loss, ear pain, nasal congestion, sore  throat Cardiovascular: No chest pain, palpitations Respiratory: No shortness of breath at rest or with exertion.   No wheezes GastrointestinaI: No nausea, vomiting, diarrhea, abdominal pain, fecal incontinence Genitourinary: No dysuria.   She has hesitancy. Sometimes she has difficulty emptying completely Flomax has helped urinary hesitancy. 1 x nocturia. Musculoskeletal:She notes pain in the left leg and some back pain Integumentary: No rash, pruritus, skin lesions Neurological: as above Psychiatric: Mild depression at this time.  Moderate anxiety Endocrine: No palpitations, diaphoresis, change in appetite, change in weigh or increased thirst Hematologic/Lymphatic: No anemia, purpura, petechiae. Allergic/Immunologic: No itchy/runny eyes, nasal congestion, recent allergic reactions, rashes  ALLERGIES: Allergies  Allergen Reactions  . Ace Inhibitors Swelling  . Amoxicillin Itching  . Lisinopril Swelling    Lower lip swelling    HOME MEDICATIONS:  Current Outpatient Medications:  .  amantadine (SYMMETREL) 100 MG capsule, Take 100 mg by mouth 2 (two) times daily., Disp: , Rfl: 2 .  amitriptyline (ELAVIL) 25 MG tablet, TAKE 1-2 TABLETS BY MOUTH AT BEDTIME, Disp: 180 tablet, Rfl: 0 .  amLODipine (NORVASC) 5 MG tablet, Take 1 tablet (5 mg total) by mouth daily. (Patient taking differently: Take 5 mg by mouth every morning. ), Disp: 90 tablet, Rfl: 0 .  amphetamine-dextroamphetamine (ADDERALL XR) 20 MG 24 hr capsule, Take 1 capsule (20 mg total) by mouth daily., Disp: 30 capsule, Rfl: 0 .  apixaban (ELIQUIS) 5 MG TABS tablet, Take 1 tablet (5 mg total) by mouth 2 (two) times daily., Disp: 60 tablet, Rfl: 0 .  baclofen (LIORESAL) 20 MG tablet, Take 1 tablet (20 mg total) by mouth 3 (three) times daily as needed (for ms). (Patient taking differently: Take 20 mg by mouth 4 (four) times daily. ), Disp: 270 each, Rfl: 3 .  busPIRone (BUSPAR) 15 MG tablet, Take 1 tablet (15 mg total) by mouth 2  (two) times daily., Disp: 60 tablet, Rfl: 11 .  citalopram (CELEXA) 20 MG tablet, Take 20 mg by mouth every morning. , Disp: , Rfl: 2 .  dalfampridine 10 MG TB12, Take 1 tablet (10 mg total) by mouth 2 (two) times daily. (Patient taking differently: Take 10 mg by mouth 2 (two) times daily. ), Disp: 180 tablet, Rfl: 3 .  Dimethyl Fumarate (TECFIDERA) 240 MG CPDR, Take 240 mg by mouth 2 (two) times daily. , Disp: , Rfl:  .  gabapentin (NEURONTIN) 800 MG tablet, Take 1 tablet (800 mg total) by mouth 3 (three) times daily., Disp: 90 tablet, Rfl: 5 .  hydrOXYzine (ATARAX/VISTARIL) 10 MG tablet, Take 10 mg by mouth 3 (three) times daily as needed for anxiety., Disp: , Rfl: 2 .  lamoTRIgine (LAMICTAL) 200 MG tablet, Take 1 tablet (200 mg total) by mouth 2 (two) times daily., Disp: 60 tablet, Rfl: 11 .  Multiple Vitamin (MULTIVITAMIN WITH MINERALS) TABS tablet, Take 1 tablet by mouth daily., Disp: 30 tablet, Rfl: 0 .  Oxcarbazepine (TRILEPTAL) 300 MG tablet, Take 1 tablet (300 mg total) by mouth 2 (two) times daily., Disp: 60 tablet, Rfl: 1 .  oxyCODONE (OXY IR/ROXICODONE) 5 MG immediate release tablet, Take 5 mg by mouth every 4 (four) hours as needed for severe pain.,  Disp: , Rfl:  .  polyethylene glycol (MIRALAX / GLYCOLAX) packet, Take 17 g by mouth daily., Disp: 14 each, Rfl: 0 .  senna-docusate (SENOKOT-S) 8.6-50 MG tablet, Take 1 tablet by mouth at bedtime., Disp: 30 tablet, Rfl: 0 .  SUMAtriptan (IMITREX) 100 MG tablet, Take 1 tablet (100 mg total) by mouth once as needed for migraine. May repeat in 2 hours if headache persists or recurs., Disp: 10 tablet, Rfl: 3 .  tamsulosin (FLOMAX) 0.4 MG CAPS capsule, Take 1 capsule (0.4 mg total) by mouth daily. (Patient taking differently: Take 0.4 mg by mouth daily after breakfast. ), Disp: 30 capsule, Rfl: 11 .  tiZANidine (ZANAFLEX) 4 MG tablet, TAKE 1 TABLET BY MOUTH AT BEDTIME, Disp: 30 tablet, Rfl: 0 .  valACYclovir (VALTREX) 500 MG tablet, Take 1 tablet  (500 mg total) by mouth 2 (two) times daily., Disp: 60 tablet, Rfl: 2 .  metoprolol tartrate (LOPRESSOR) 25 MG tablet, Take 0.5 tablets (12.5 mg total) by mouth 2 (two) times daily., Disp: 30 tablet, Rfl: 0  PAST MEDICAL HISTORY: Past Medical History:  Diagnosis Date  . Anticoagulant long-term use    Eliquis  . Anxiety   . Chronic pain   . CKD (chronic kidney disease), stage III ([place])   . Depression   . 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 Gait disturbance    due to MS  . GERD (gastroesophageal reflux disease)    watch diet  . 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 History of DVT of lower extremity 2007  . History of encephalopathy 26/8341   acute metabolic encephalopathy secondary to MS,  resolved  . History of MRSA infection 2004   right hip infection post THA  . History of pulmonary embolus (PE) 2005  . Hydronephrosis, left    w/ acute kidney injury 02/ 2019  . Hypertension   . Left-sided weakness    due to MS  . Migraines   . MS (multiple sclerosis) [facility]) neurologist-  dr Renne Musca   dx 2000--  . Muscle spasticity   . Neurogenic bladder    due to MS  . Pulmonary embolism, bilateral ([place]) 09/17/2017   treated w/ eliquis  . Strains to urinate   . Urgency of urination     PAST SURGICAL HISTORY: Past Surgical History:  Procedure Laterality Date  . CYSTOSCOPY WITH RETROGRADE PYELOGRAM, URETEROSCOPY AND STENT PLACEMENT Bilateral 11/29/2017   Procedure: BILATERAL  RETROGRADE PYELOGRAM, LEFT DIAGNOSIC URETEROSCOPY AND STENT LEFT PLACEMENT;  Surgeon: Ardis Hughs, MD;  Location: [facility];  Service: Urology;  Laterality: Bilateral;  . HIP ARTHROSCOPY Left 07-05-2013   dr pill @[facility]    iliopsosas release, synovectomy  . REVISION TOTAL HIP ARTHROPLASTY Right 03-28-2003    dr Alvan Dame @[facility]   . TOTAL HIP ARTHROPLASTY Bilateral left  11-05-2002  dr Alvan Dame @[facility] ;   right 06/ 2004  @[facility]     FAMILY HISTORY: Family History  Problem Relation Age of Onset  . Healthy Mother   . Healthy Father   . Breast cancer Other        paternal grandmother dx age 2  . Colon cancer Other        paternal grandmother  . Hypertension Other   . Diabetes Other     SOCIAL HISTORY:  Social History   Socioeconomic History  . Marital status: Married  Spouse name: Not on file  . Number of children: Not on file  . Years of education: Not on file  . Highest education level: Not on file  Occupational History  . Not on file  Social Needs  . Financial resource strain: Not on file  . Food insecurity:    Worry: Not on file    Inability: Not on file  . Transportation needs:    Medical: Not on file    Non-medical: Not on file  Tobacco Use  . Smoking status: Never Smoker  . Smokeless tobacco: Never Used  Substance and Sexual Activity  . Alcohol use: No  . Drug use: No  . Sexual activity: Not Currently    Birth control/protection: Pill  Lifestyle  . Physical activity:    Days per week: Not on file    Minutes per session: Not on file  . Stress: Not on file  Relationships  . Social connections:    Talks on phone: Not on file    Gets together: Not on file    Attends religious service: Not on file    Active member of club or organization: Not on file    Attends meetings of clubs or organizations: Not on file    Relationship status: Not on file  . Intimate partner violence:    Fear of current or ex partner: Not on file    Emotionally abused: Not on file    Physically abused: Not on file    Forced sexual activity: Not on file  Other Topics Concern  . Not on file  Social History Narrative  . Not on file     PHYSICAL EXAM  Vitals:   03/28/18 1459  BP: 134/85  Pulse: (!) 118  Weight: 213 lb 8 oz (96.8 kg)  Height: 5\' 6"  (1.676 m)    Body mass index is 34.46 kg/m.   General: The patient is well-developed and  well-nourished and in no acute distress.  Musculoskeletal:   She has good neck ROM.    Neurologic Exam  Mental status: The patient is alert and oriented x 3 at the time of the examination. The patient has apparent normal recent and remote memory, with mildly reduced attention span and concentration ability.   Speech is normal.  Cranial nerves: Extraocular movements are full.  Facial strength and sensation is normal.  Trapezius strength is normal.. No obvious hearing deficits are noted.  Motor:  Muscle bulk is normal.   Muscle tone is increased in the left leg.. Strength is  5 / 5 in all 4 extremities except 4/5 distal left foot.   Sensory: Touch and vibration sensation is normal in the arms or legs.  Coordination: Finger-nose-finger is performed well bilaterally.  Heel-to-shin is slightly reduced on the right and moderately reduced on the left.  Gait and station: Station is normal.  She can walk without a cane but has a left foot drop..  She cannot tandem walk.  She walks better with a cane and without.  Reflexes: Deep tendon reflexes are increased in the legs, left greater than right.  She has crossed abductor responses, worse on the left.  She has nonsustained clonus in the left ankle    DIAGNOSTIC DATA (LABS, IMAGING, TESTING) - I reviewed patient records, labs, notes, testing and imaging myself where available.  Lab Results  Component Value Date   WBC 12.2 (H) 03/15/2018   HGB 10.7 (L) 03/15/2018   HCT 33.4 (L) 03/15/2018   MCV 104.7 (H)  03/15/2018   PLT 330 03/15/2018      Component Value Date/Time   NA 140 03/15/2018 0806   NA 144 06/21/2017 1626   K 3.8 03/15/2018 0806   CL 105 03/15/2018 0806   CO2 27 03/15/2018 0806   GLUCOSE 128 (H) 03/15/2018 0806   BUN 20 03/15/2018 0806   BUN 16 06/21/2017 1626   CREATININE 1.39 (H) 03/15/2018 0806   CREATININE 0.85 07/03/2014 0959   CALCIUM 9.3 03/15/2018 0806   PROT 7.5 01/11/2018 1542   ALBUMIN 4.2 01/11/2018 1542   AST  12 01/11/2018 1542   ALT 7 01/11/2018 1542   ALKPHOS 112 01/11/2018 1542   BILITOT 0.5 01/11/2018 1542   GFRNONAA 48 (L) 03/15/2018 0806   GFRNONAA 88 07/03/2014 0959   GFRAA 55 (L) 03/15/2018 0806   GFRAA >89 07/03/2014 0959   Lab Results  Component Value Date   CHOL 228 (H) 04/20/2017   HDL 77 04/20/2017   LDLCALC 142 (H) 04/20/2017   TRIG 46 04/20/2017   CHOLHDL 3.0 04/20/2017   Lab Results  Component Value Date   HGBA1C 5.3 04/20/2017   Lab Results  Component Value Date   VITAMINB12 228 03/15/2018   Lab Results  Component Value Date   TSH 1.250 06/21/2017      ASSESSMENT AND PLAN  Multiple sclerosis ([place]) - Plan: HIV Antibody (routine testing w rflx), Hepatitis B core antibody, total, Hepatitis B surface antigen, IgG, IgA, IgM, Hepatitis B surface antibody,qualitative, Hepatitis C antibody, QuantiFERON-TB Gold Plus  High risk medication use - Plan: HIV Antibody (routine testing w rflx), Hepatitis B core antibody, total, Hepatitis B surface antigen, IgG, IgA, IgM, Hepatitis B surface antibody,qualitative, Hepatitis C antibody, QuantiFERON-TB Gold Plus  Left leg weakness  Hydronephrosis of left kidney  Attention deficit  Numbness  Gait disturbance   1.    She continues to have episodes of worsening left-sided function.  I do not believe that the majority of these represent relapses though she does always feel better after she gets steroids.  She would like to consider a different DMT than Tecfidera.  We discussed Ocrevus.  Her CD4 count has been above 200.  We will check other labs to rule out chronic infections. 2.     Continue medications for anxiety, pain and spasticity.    Change over from Lyrica to gabapentin. 3.    Try to stay active. 4.     Return to see me in 4 months or sooner if there are new or worsening neurologic symptoms.  40 minutes face-to-face evaluation with greater than one half the time counseling coordinating care about her MS, recent  hospitalization and switching disease modifying therapies   Richard A. Felecia Shelling, MD, PhD 0/92/3300, 7:62 PM Certified in Neurology, [place] [facility], Sleep Medicine, Pain Medicine and Neuroimaging  [facility] [address], [place], [place] [address] [phone] ]

## 2018-03-28 NOTE — Therapy (Signed)
[place], [place], [address] Phone: [phone]   Fax:  [phone]  Physical Therapy Treatment/ERO   Patient Details  Name: Dawn Peters MRN: [id number] Date of Birth: 1980-11-13 Referring Provider (PT): Rulon Abide [place]    Encounter Date: 03/28/2018  PT End of Session - 03/28/18 1159    Visit Number  2    Number of Visits  16    Date for PT Re-Evaluation  05/25/18    Authorization Type  [employer] MCR complete     PT Start Time  2330    PT Stop Time  2352    PT Time Calculation (min)  22 min    Activity Tolerance  Patient tolerated treatment well    Behavior During Therapy  [facility] for tasks assessed/performed       Past Medical History:  Diagnosis Date  . Anticoagulant long-term use    Eliquis  . Anxiety   . Chronic pain   . CKD (chronic kidney disease), stage III ([place])   . Depression   . DVT, lower extremity, recurrent ([place]) 09/17/2017   left lower extremity-- treated with eliquis  . Gait disturbance    due to MS  . GERD (gastroesophageal reflux disease)    watch diet  . 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 History of DVT of lower extremity 2007  . History of encephalopathy 69/6295   acute metabolic encephalopathy secondary to MS,  resolved  . History of MRSA infection 2004   right hip infection post THA  . History of pulmonary embolus (PE) 2005  . Hydronephrosis, left    w/ acute kidney injury 02/ 2019  . Hypertension   . Left-sided weakness    due to MS  . Migraines   . MS (multiple sclerosis) [facility]) neurologist-  dr Renne Musca   dx 2000--  . Muscle spasticity   . Neurogenic bladder    due to MS  . Pulmonary embolism, bilateral ([place]) 09/17/2017   treated w/ eliquis  . Strains to urinate   . Urgency of urination     Past Surgical History:  Procedure Laterality Date  . CYSTOSCOPY WITH RETROGRADE  PYELOGRAM, URETEROSCOPY AND STENT PLACEMENT Bilateral 11/29/2017   Procedure: BILATERAL  RETROGRADE PYELOGRAM, LEFT DIAGNOSIC URETEROSCOPY AND STENT LEFT PLACEMENT;  Surgeon: Ardis Hughs, MD;  Location: [facility];  Service: Urology;  Laterality: Bilateral;  . HIP ARTHROSCOPY Left 07-05-2013   dr pill @[facility]    iliopsosas release, synovectomy  . REVISION TOTAL HIP ARTHROPLASTY Right 03-28-2003    dr Alvan Dame @[facility]   . TOTAL HIP ARTHROPLASTY Bilateral left 11-05-2002  dr Alvan Dame @[facility] ;   right 06/ 2004  @[facility]     There were no vitals filed for this visit.  Subjective Assessment - 03/28/18 1131    Subjective  Had an MS relapse and gets really weak in Lt leg resulting in dragging. this causes significant increase in LBP.     How long can you stand comfortably?  <1 hour    How long can you walk comfortably?  able to walk from car to grocery store but needs cart in store    Patient Stated Goals  less pain in her back, leg strength    Currently in Pain?  Yes    Pain Score  8     Pain Location  Back    Pain Orientation  Mid;Lower;Upper  Pain Descriptors / Indicators  Sharp;Sore    Pain Score  8    Pain Location  Leg    Pain Orientation  Right;Left    Pain Descriptors / Indicators  --   cramping tingling        [facility] PT Assessment - 03/28/18 0001      Assessment   Medical Diagnosis  Low Back Pain/ Mid thoracic pain    Referring Provider (PT)  Rulon Abide PA     Onset Date/Surgical Date  --   chronic issue, MS flare up 03/13/18   Next MD Visit  sees MD for MS today    Prior Therapy  Yes several years ago       Precautions   Precautions  Fall      Restrictions   Weight Bearing Restrictions  No      Balance Screen   Has the patient fallen in the past 6 months  Yes    How many times?  4-5    Has the patient had a decrease in activity level because of a fear of falling?   Yes    Is the patient reluctant to leave their home because of a fear of falling?   No       Home Film/video editor residence    Additional Comments  single level home with no steps       Prior Function   Level of Independence  Independent    Vocation  On disability    Leisure  walking       Cognition   Overall Cognitive Status  Within Functional Limits for tasks assessed      Sensation   Light Touch  Appears Intact    Additional Comments  WFL, reports some tingling in calves      AROM   Overall AROM Comments  lumbar AROM WFL without pain in straight plane      Strength   Overall Strength Comments  gross LLE 3-/5      Palpation   Palpation comment  significant tightness along post chain of LLE      Transfers   Comments  poor stability in all gait and transfers- no AD      Ambulation/Gait   Gait Comments  antalgic on Lt, grabbing walls/furniture, lumbar flexion, catches Lt foot on floor                             PT Short Term Goals - 03/28/18 1141      PT SHORT TERM GOAL #1   Title  Patient will increase lumbar flexion by 25%     Baseline  ROM WFL that is not uncomfortable in lumbar region, tightness and discomfort noted moreso in thoracic region during ROM measurements    Status  Achieved      PT SHORT TERM GOAL #2   Title  Patient will increase gross bilateral lower extremity strength to 4+/5     Baseline  see flowsheet    Status  On-going      PT SHORT TERM GOAL #3   Title  Patient will be independent with initial HEP     Baseline  doing initial HEP    Status  Achieved        PT Long Term Goals - 03/08/18 1450      PT LONG TERM GOAL #1   Title  Patient will stand for  45 minutes without pain in order to perfrom ADL's     Time  8    Period  Weeks    Status  New    [place] Date  05/03/18      PT LONG TERM GOAL #2   Title  Patient will ambualte 3000' without increased pain in order to go to the mall     Time  8    Period  Weeks    Status  New    Target Date  05/03/18      PT LONG TERM GOAL #3    Title  Patient will be independent with final HEP.     Time  8    Period  Weeks    Status  New    Target Date  05/03/18            Plan - 03/28/18 1200    Clinical Impression Statement  Pt returns to PT today following MS flare up that has resulted in significant Lt LE weakness that limits functional ability and creates effects along biomechanical chain. Increased pain and instability when walking. Pt reports drop foot but is able to demo activation of DF, is limited by significant tightness of gastroc/soleus and was educated on stretch to do at home.     History and Personal Factors relevant to plan of care:  MS with recent flare up, bilat THA    Rehab Potential  Good    PT Frequency  2x / week    PT Duration  8 weeks    PT Treatment/Interventions  ADLs/Self Care Home Management;Cryotherapy;Electrical Stimulation;Iontophoresis 4mg /ml Dexamethasone;Traction;Ultrasound;Gait training;Stair training;Functional mobility training;Therapeutic exercise;Therapeutic activities;DME Instruction;Neuromuscular re-education;Patient/family education;Manual techniques;Manual lymph drainage;Passive range of motion;Dry needling;Taping    PT Next Visit Plan  soft tissue mobilization to the lumbar spine and upper back. genlte hip stretching. Patient has had bilateral hip replacements. Hip flexion strengthening; consider heel side; continue with PPT; may have to do standing hip flexion. Progress to SLR when able.  Patient will need to work on her balance at some point as well. We may want to suggest a cane for now until she can get her pain under control. DF stretching    PT Home Exercise Plan  seated gastroc stretch    Consulted and Agree with Plan of Care  Patient       Patient will benefit from skilled therapeutic intervention in order to improve the following deficits and impairments:  Abnormal gait, Pain, Decreased activity tolerance, Decreased endurance, Decreased range of motion, Decreased strength,  Difficulty walking, Decreased balance, Increased muscle spasms, Improper body mechanics  Visit Diagnosis: Chronic bilateral low back pain without sciatica - Plan: PT plan of care cert/re-cert  Muscle spasm of back - Plan: PT plan of care cert/re-cert     Problem List Patient Active Problem List   Diagnosis Date Noted  . MS (multiple sclerosis) ([place]) 03/14/2018  . CKD (chronic kidney disease), stage III ([place])   . Obesity (BMI 30.0-34.9)   . Anemia 01/09/2018  . Pulmonary embolism ([place]) 09/18/2017  . Elevated serum creatinine 05/12/2017  . Acute encephalopathy 04/20/2017  . Hydronephrosis of left kidney 10/24/2016  . Attention deficit 10/24/2016  . Left leg weakness 08/13/2016  . Chronic pain 08/13/2016  . Mild renal insufficiency 08/13/2016  . Left-sided weakness   . High risk medication use 09/15/2015  . Hip pain, bilateral 07/28/2015  . Urinary hesitancy 07/28/2015  . Multiple sclerosis exacerbation ([place]) 07/17/2015  . Urinary disorder 06/11/2015  .  Gait disturbance 05/19/2015  . Numbness 05/19/2015  . Depression with anxiety 05/13/2015  . Other fatigue 05/13/2015  . Left knee pain 05/13/2015  . Avascular necrosis of bones of both hips ([place]) 05/13/2015  . Screening for HIV (human immunodeficiency virus) 07/08/2014  . Essential hypertension, benign 11/19/2013  . Anxiety state, unspecified 11/19/2013  . Screen for STD (sexually transmitted disease) 11/01/2013  . Encounter for routine gynecological examination 11/01/2013  . Oral contraceptive use 10/20/2011  . Multiple sclerosis ([place]) 08/08/2007  . RASH AND OTHER NONSPECIFIC SKIN ERUPTION 06/22/2007  . AVASCULAR NECROSIS, FEMORAL HEAD 03/11/2006  . Essential hypertension 02/03/2006  . 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 DVT, HX OF 02/03/2006    Jerrod Damiano C. Jamey Harman PT, DPT 03/28/18 1:27 PM   [facility] [address] [place], [place], [address] Phone: [phone]    Fax:  [phone]  Name: Artesha Wemhoff [place] MRN: [id number] Date of Birth: April 15, 1980

## 2018-03-28 NOTE — Telephone Encounter (Signed)
Faxed signed [employer] order to [phone] [facility] location). Received fax confirmation. Order re request for: Left AFO brace for left foot drop.  DX: MS, gait disturbance, spasticity. Pt will wear at night and during the day.

## 2018-03-29 ENCOUNTER — Encounter: Payer: Medicare Other | Admitting: Physical Therapy

## 2018-03-31 LAB — IGG, IGA, IGM
IgA/Immunoglobulin A, Serum: 153 mg/dL (ref 87–352)
IgG (Immunoglobin G), Serum: 1175 mg/dL (ref 700–1600)
IgM (Immunoglobulin M), Srm: 167 mg/dL (ref 26–217)

## 2018-03-31 LAB — HEPATITIS B SURFACE ANTIGEN: HEP B S AG: NEGATIVE

## 2018-03-31 LAB — HIV ANTIBODY (ROUTINE TESTING W REFLEX): HIV Screen 4th Generation wRfx: NONREACTIVE

## 2018-03-31 LAB — QUANTIFERON-TB GOLD PLUS
QuantiFERON Mitogen Value: 0.12 IU/mL
QuantiFERON Nil Value: 0.02 IU/mL
QuantiFERON TB1 Ag Value: 0.02 IU/mL
QuantiFERON TB2 Ag Value: 0.02 IU/mL
QuantiFERON-TB Gold Plus: UNDETERMINED — AB

## 2018-03-31 LAB — HEPATITIS B CORE ANTIBODY, TOTAL: HEP B C TOTAL AB: NEGATIVE

## 2018-03-31 LAB — HEPATITIS B SURFACE ANTIBODY,QUALITATIVE: HEP B SURFACE AB, QUAL: NONREACTIVE

## 2018-03-31 LAB — HEPATITIS C ANTIBODY: Hep C Virus Ab: <0.1 s/co ratio (ref 0.0–0.9)

## 2018-04-02 ENCOUNTER — Encounter (HOSPITAL_COMMUNITY): Payer: Self-pay | Admitting: *Deleted

## 2018-04-02 ENCOUNTER — Encounter: Payer: Medicare Other | Admitting: Physical Therapy

## 2018-04-02 ENCOUNTER — Inpatient Hospital Stay (HOSPITAL_COMMUNITY)
Admission: EM | Admit: 2018-04-02 | Discharge: 2018-04-05 | DRG: 060 | Disposition: A | Discharge status: Skilled Nursing Facility | Payer: Medicare Other | Attending: Nephrology | Admitting: Nephrology

## 2018-04-02 DIAGNOSIS — G35 Multiple sclerosis: Principal | ICD-10-CM | POA: Diagnosis present

## 2018-04-02 DIAGNOSIS — R292 Abnormal reflex: Secondary | ICD-10-CM | POA: Diagnosis present

## 2018-04-02 DIAGNOSIS — Z7901 Long term (current) use of anticoagulants: Secondary | ICD-10-CM

## 2018-04-02 DIAGNOSIS — I2699 Other pulmonary embolism without acute cor pulmonale: Secondary | ICD-10-CM | POA: Diagnosis present

## 2018-04-02 DIAGNOSIS — I129 Hypertensive chronic kidney disease with stage 1 through stage 4 chronic kidney disease, or unspecified chronic kidney disease: Secondary | ICD-10-CM | POA: Diagnosis present

## 2018-04-02 DIAGNOSIS — Z888 Allergy status to other drugs, medicaments and biological substances status: Secondary | ICD-10-CM

## 2018-04-02 DIAGNOSIS — Z833 Family history of diabetes mellitus: Secondary | ICD-10-CM

## 2018-04-02 DIAGNOSIS — Z8614 Personal history of Methicillin resistant Staphylococcus aureus infection: Secondary | ICD-10-CM

## 2018-04-02 DIAGNOSIS — Z96643 Presence of artificial hip joint, bilateral: Secondary | ICD-10-CM | POA: Diagnosis present

## 2018-04-02 DIAGNOSIS — Z9181 History of falling: Secondary | ICD-10-CM

## 2018-04-02 DIAGNOSIS — Y939 Activity, unspecified: Secondary | ICD-10-CM

## 2018-04-02 DIAGNOSIS — S81011A Laceration without foreign body, right knee, initial encounter: Secondary | ICD-10-CM | POA: Diagnosis present

## 2018-04-02 DIAGNOSIS — Z86711 Personal history of pulmonary embolism: Secondary | ICD-10-CM

## 2018-04-02 DIAGNOSIS — R5381 Other malaise: Secondary | ICD-10-CM | POA: Diagnosis present

## 2018-04-02 DIAGNOSIS — G8194 Hemiplegia, unspecified affecting left nondominant side: Secondary | ICD-10-CM | POA: Diagnosis present

## 2018-04-02 DIAGNOSIS — Z803 Family history of malignant neoplasm of breast: Secondary | ICD-10-CM

## 2018-04-02 DIAGNOSIS — Z86718 Personal history of other venous thrombosis and embolism: Secondary | ICD-10-CM

## 2018-04-02 DIAGNOSIS — K219 Gastro-esophageal reflux disease without esophagitis: Secondary | ICD-10-CM | POA: Diagnosis present

## 2018-04-02 DIAGNOSIS — Z8249 Family history of ischemic heart disease and other diseases of the circulatory system: Secondary | ICD-10-CM

## 2018-04-02 DIAGNOSIS — N319 Neuromuscular dysfunction of bladder, unspecified: Secondary | ICD-10-CM | POA: Diagnosis present

## 2018-04-02 DIAGNOSIS — F329 Major depressive disorder, single episode, unspecified: Secondary | ICD-10-CM | POA: Diagnosis present

## 2018-04-02 DIAGNOSIS — Z880 Allergy status to penicillin: Secondary | ICD-10-CM

## 2018-04-02 DIAGNOSIS — N1831 Chronic kidney disease, stage 3a: Secondary | ICD-10-CM | POA: Diagnosis present

## 2018-04-02 DIAGNOSIS — N183 Chronic kidney disease, stage 3 unspecified: Secondary | ICD-10-CM | POA: Diagnosis present

## 2018-04-02 DIAGNOSIS — W19XXXA Unspecified fall, initial encounter: Secondary | ICD-10-CM | POA: Diagnosis present

## 2018-04-02 DIAGNOSIS — Y929 Unspecified place or not applicable: Secondary | ICD-10-CM

## 2018-04-02 DIAGNOSIS — Z800 Family history of malignant neoplasm of digestive organs: Secondary | ICD-10-CM

## 2018-04-02 DIAGNOSIS — Z87448 Personal history of other diseases of urinary system: Secondary | ICD-10-CM

## 2018-04-02 DIAGNOSIS — F419 Anxiety disorder, unspecified: Secondary | ICD-10-CM | POA: Diagnosis present

## 2018-04-02 DIAGNOSIS — N1330 Unspecified hydronephrosis: Secondary | ICD-10-CM

## 2018-04-02 DIAGNOSIS — G8929 Other chronic pain: Secondary | ICD-10-CM | POA: Diagnosis present

## 2018-04-02 DIAGNOSIS — Z79899 Other long term (current) drug therapy: Secondary | ICD-10-CM

## 2018-04-02 DIAGNOSIS — I10 Essential (primary) hypertension: Secondary | ICD-10-CM | POA: Diagnosis present

## 2018-04-02 DIAGNOSIS — Z09 Encounter for follow-up examination after completed treatment for conditions other than malignant neoplasm: Secondary | ICD-10-CM

## 2018-04-02 NOTE — ED Triage Notes (Signed)
Pt bib PTAR and presents with increasing weakness for the past three days d/t MS.  Pt's medications are not working for her and she usually gets admitted for a few days for a course of steroids. Pt a/o x 4 and non ambulatory.

## 2018-04-02 NOTE — ED Provider Notes (Signed)
[place] DEPT Provider Note   CSN: [id number] Arrival date & time: 04/02/18  2149     History   Chief Complaint No chief complaint on file.   HPI Dawn Peters is a 38 y.o. female.  Pt presents to the ED today with an MS flare.  The pt has left sided weakness.  She said she's not been able to walk.  Husband has been having to hold her upright on the toilet b/c she can't hold herself up.  She was admitted from 1/7-1/10 for a MS flare and was treated with IV steroids.  During that admission, pt had an MRI of her brain and cervical spine which showed no evidence of MS disease progression.  She is followed by Dr. Felecia Shelling (neurology) whom she last saw on 1/22.  She is currently on Tecfidera and feels like that is not working.  Neurology is going to switch her to [place].          Past Medical History:  Diagnosis Date  . Anticoagulant long-term use    Eliquis  . Anxiety   . Chronic pain   . CKD (chronic kidney disease), stage III ([place])   . Depression   . DVT, lower extremity, recurrent ([place]) 09/17/2017   left lower extremity-- treated with eliquis  . Gait disturbance    due to MS  . GERD (gastroesophageal reflux disease)    watch diet  . 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 History of DVT of lower extremity 2007  . History of encephalopathy 60/7371   acute metabolic encephalopathy secondary to MS,  resolved  . History of MRSA infection 2004   right hip infection post THA  . History of pulmonary embolus (PE) 2005  . Hydronephrosis, left    w/ acute kidney injury 02/ 2019  . Hypertension   . Left-sided weakness    due to MS  . Migraines   . MS (multiple sclerosis) [facility]) neurologist-  dr Renne Musca   dx 2000--  . Muscle spasticity   . Neurogenic bladder    due to MS  . Pulmonary embolism, bilateral ([place]) 09/17/2017   treated w/ eliquis  . Strains to urinate    . Urgency of urination     Patient Active Problem List   Diagnosis Date Noted  . MS (multiple sclerosis) ([place]) 03/14/2018  . CKD (chronic kidney disease), stage III ([place])   . Obesity (BMI 30.0-34.9)   . Anemia 01/09/2018  . Pulmonary embolism ([place]) 09/18/2017  . Elevated serum creatinine 05/12/2017  . Acute encephalopathy 04/20/2017  . Hydronephrosis of left kidney 10/24/2016  . Attention deficit 10/24/2016  . Left leg weakness 08/13/2016  . Chronic pain 08/13/2016  . Mild renal insufficiency 08/13/2016  . Left-sided weakness   . High risk medication use 09/15/2015  . Hip pain, bilateral 07/28/2015  . Urinary hesitancy 07/28/2015  . Multiple sclerosis exacerbation ([place]) 07/17/2015  . Urinary disorder 06/11/2015  . Gait disturbance 05/19/2015  . Numbness 05/19/2015  . Depression with anxiety 05/13/2015  . Other fatigue 05/13/2015  . Left knee pain 05/13/2015  . Avascular necrosis of bones of both hips ([place]) 05/13/2015  . Screening for HIV (human immunodeficiency virus) 07/08/2014  . Essential hypertension, benign 11/19/2013  . Anxiety state, unspecified 11/19/2013  . Screen for STD (sexually transmitted disease) 11/01/2013  . Encounter for routine gynecological examination 11/01/2013  . Oral contraceptive use  10/20/2011  . Multiple sclerosis ([place]) 08/08/2007  . RASH AND OTHER NONSPECIFIC SKIN ERUPTION 06/22/2007  . AVASCULAR NECROSIS, FEMORAL HEAD 03/11/2006  . Essential hypertension 02/03/2006  . 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 DVT, HX OF 02/03/2006    Past Surgical History:  Procedure Laterality Date  . CYSTOSCOPY WITH RETROGRADE PYELOGRAM, URETEROSCOPY AND STENT PLACEMENT Bilateral 11/29/2017   Procedure: BILATERAL  RETROGRADE PYELOGRAM, LEFT DIAGNOSIC URETEROSCOPY AND STENT LEFT PLACEMENT;  Surgeon: Ardis Hughs, MD;  Location[facility];  Service: Urology;  Laterality: Bilateral;  . HIP ARTHROSCOPY Left 07-05-2013   dr pill @[facility]     iliopsosas release, synovectomy  . REVISION TOTAL HIP ARTHROPLASTY Right 03-28-2003    dr Alvan Dame @[facility]   . TOTAL HIP ARTHROPLASTY Bilateral left 11-05-2002  dr Alvan Dame @[facility] ;   right 06/ 2004  @[facility]      OB History    Gravida  1   Para  1   Term  1   Preterm      AB      Living  1     SAB      TAB      Ectopic      Multiple      Live Births  1            Home Medications    Prior to Admission medications   Medication Sig Start Date End Date Taking? Authorizing Provider  amantadine (SYMMETREL) 100 MG capsule Take 100 mg by mouth 2 (two) times daily. 09/11/17   [provider]  amitriptyline (ELAVIL) 25 MG tablet TAKE 1-2 TABLETS BY MOUTH AT BEDTIME 02/26/18   Sater, Nanine Means, MD  amLODipine (NORVASC) 5 MG tablet Take 1 tablet (5 mg total) by mouth daily. Patient taking differently: Take 5 mg by mouth every morning.  05/21/17   Arrien, Jimmy Picket, MD  amphetamine-dextroamphetamine (ADDERALL XR) 20 MG 24 hr capsule Take 1 capsule (20 mg total) by mouth daily. 03/26/18   Sater, Nanine Means, MD  apixaban (ELIQUIS) 5 MG TABS tablet Take 1 tablet (5 mg total) by mouth 2 (two) times daily. 09/26/17   Kayleen Memos, DO  baclofen (LIORESAL) 20 MG tablet Take 1 tablet (20 mg total) by mouth 3 (three) times daily as needed (for ms). Patient taking differently: Take 20 mg by mouth 4 (four) times daily.  08/03/17   Sater, Nanine Means, MD  busPIRone (BUSPAR) 15 MG tablet Take 1 tablet (15 mg total) by mouth 2 (two) times daily. 08/02/17   Sater, Nanine Means, MD  citalopram (CELEXA) 20 MG tablet Take 20 mg by mouth every morning.  09/11/17   [provider]  dalfampridine 10 MG TB12 Take 1 tablet (10 mg total) by mouth 2 (two) times daily. Patient taking differently: Take 10 mg by mouth 2 (two) times daily.  08/18/17   Sater, Nanine Means, MD  Dimethyl Fumarate (TECFIDERA) 240 MG CPDR Take 240 mg by mouth 2 (two) times daily.     [provider]  gabapentin (NEURONTIN) 800  MG tablet Take 1 tablet (800 mg total) by mouth 3 (three) times daily. 10/19/17   Sater, Nanine Means, MD  lamoTRIgine (LAMICTAL) 200 MG tablet Take 1 tablet (200 mg total) by mouth 2 (two) times daily. 08/05/16   Sater, Nanine Means, MD  metoprolol tartrate (LOPRESSOR) 25 MG tablet Take 0.5 tablets (12.5 mg total) by mouth 2 (two) times daily. 05/21/17 03/13/18  Arrien, Jimmy Picket, MD  Multiple Vitamin (MULTIVITAMIN WITH MINERALS) TABS  tablet Take 1 tablet by mouth daily. 09/20/17   Kayleen Memos, DO  Oxcarbazepine (TRILEPTAL) 300 MG tablet Take 1 tablet (300 mg total) by mouth 2 (two) times daily. 11/11/15   Sater, Nanine Means, MD  oxyCODONE (OXY IR/ROXICODONE) 5 MG immediate release tablet Take 5 mg by mouth every 4 (four) hours as needed for severe pain.    [provider]  polyethylene glycol (MIRALAX / GLYCOLAX) packet Take 17 g by mouth daily. 03/16/18   Florencia Reasons, MD  senna-docusate (SENOKOT-S) 8.6-50 MG tablet Take 1 tablet by mouth at bedtime. 03/15/18   Florencia Reasons, MD  SUMAtriptan (IMITREX) 100 MG tablet Take 1 tablet (100 mg total) by mouth once as needed for migraine. May repeat in 2 hours if headache persists or recurs. 12/28/16   Sater, Nanine Means, MD  tamsulosin (FLOMAX) 0.4 MG CAPS capsule Take 1 capsule (0.4 mg total) by mouth daily. Patient taking differently: Take 0.4 mg by mouth daily after breakfast.  09/20/17   Sater, Nanine Means, MD  tiZANidine (ZANAFLEX) 4 MG tablet TAKE 1 TABLET BY MOUTH AT BEDTIME 03/26/18   Sater, Nanine Means, MD  valACYclovir (VALTREX) 500 MG tablet Take 1 tablet (500 mg total) by mouth 2 (two) times daily. 03/20/17   Sater, Nanine Means, MD    Family History Family History  Problem Relation Age of Onset  . Healthy Mother   . Healthy Father   . Breast cancer Other        paternal grandmother dx age 95  . Colon cancer Other        paternal grandmother  . Hypertension Other   . Diabetes Other     Social History Social History   Tobacco Use  . Smoking status:  Never Smoker  . Smokeless tobacco: Never Used  Substance Use Topics  . Alcohol use: No  . Drug use: No     Allergies   Ace inhibitors; Amoxicillin; and Lisinopril   Review of Systems Review of Systems  Neurological: Positive for weakness.  All other systems reviewed and are negative.    Physical Exam Updated Vital Signs BP (!) 166/104   Pulse 72   Temp 98.6 F (37 C) (Oral)   Resp 15   Ht 5\' 6"  (1.676 m)   Wt 95.7 kg   LMP 03/13/2018   SpO2 97%   BMI 34.06 kg/m   Physical Exam Vitals signs and nursing note reviewed.  Constitutional:      Appearance: Normal appearance.  HENT:     Head: Normocephalic and atraumatic.     Right Ear: External ear normal.     Left Ear: External ear normal.     Nose: Nose normal.     Mouth/Throat:     Mouth: Mucous membranes are moist.  Eyes:     Extraocular Movements: Extraocular movements intact.     Conjunctiva/sclera: Conjunctivae normal.     Pupils: Pupils are equal, round, and reactive to light.  Neck:     Musculoskeletal: Normal range of motion and neck supple.  Cardiovascular:     Rate and Rhythm: Normal rate and regular rhythm.     Pulses: Normal pulses.     Heart sounds: Normal heart sounds.  Pulmonary:     Effort: Pulmonary effort is normal.     Breath sounds: Normal breath sounds.  Abdominal:     General: Abdomen is flat.     Palpations: Abdomen is soft.  Musculoskeletal: Normal range of motion.  Skin:  General: Skin is warm.     Capillary Refill: Capillary refill takes less than 2 seconds.  Neurological:     Mental Status: She is alert and oriented to person, place, and time.     Comments: Left sided weakness      ED Treatments / Results  Labs (all labs ordered are listed, but only abnormal results are displayed) Labs Reviewed  COMPREHENSIVE METABOLIC PANEL - Abnormal; Notable for the following components:      Result Value   Glucose, Bld 137 (*)    Creatinine, Ser 1.47 (*)    GFR calc non Af Amer  45 (*)    GFR calc Af Amer 52 (*)    All other components within normal limits  CBC WITH DIFFERENTIAL/PLATELET - Abnormal; Notable for the following components:   RBC 3.68 (*)    MCV 103.0 (*)    Neutro Abs 8.1 (*)    Lymphs Abs 0.4 (*)    All other components within normal limits  URINALYSIS, ROUTINE W REFLEX MICROSCOPIC  I-STAT BETA HCG BLOOD, ED ([facility], [facility], [facility] ONLY)    EKG None  Radiology No results found.  Procedures Procedures (including critical care time)  Medications Ordered in ED Medications  methylPREDNISolone sodium succinate (SOLU-MEDROL) 1,000 mg in sodium chloride 0.9 % 50 mL IVPB (has no administration in time range)  SUMAtriptan (IMITREX) tablet 100 mg (100 mg Oral Given 04/03/18 0113)  sodium chloride 0.9 % bolus 1,000 mL (0 mLs Intravenous Stopped 04/03/18 0220)     Initial Impression / Assessment and Plan / ED Course  I have reviewed the triage vital signs and the nursing notes.  Pertinent labs & imaging results that were available during my care of the patient were reviewed by me and considered in my medical decision making (see chart for details).    Pt d/w Dr. Lorraine Lax (neurology) who recommended IV steroids and admission if there's no evidence of infection.  Labs per pt's norm.  She has no evidence of infection, so I will start her on 1000 mg solumedrol IV.  Pt d/w hospitalists (Dr. Hal Hope) for admission.  Final Clinical Impressions(s) / ED Diagnoses   Final diagnoses:  Multiple sclerosis exacerbation [facility])    ED Discharge Orders    None       Isla Pence, MD 04/03/18 [phone]

## 2018-04-03 ENCOUNTER — Encounter (HOSPITAL_COMMUNITY): Payer: Self-pay | Admitting: Internal Medicine

## 2018-04-03 ENCOUNTER — Telehealth: Payer: Self-pay | Admitting: *Deleted

## 2018-04-03 ENCOUNTER — Other Ambulatory Visit: Payer: Self-pay

## 2018-04-03 DIAGNOSIS — Z86711 Personal history of pulmonary embolism: Secondary | ICD-10-CM | POA: Diagnosis not present

## 2018-04-03 DIAGNOSIS — G35 Multiple sclerosis: Secondary | ICD-10-CM | POA: Diagnosis present

## 2018-04-03 DIAGNOSIS — Z79899 Other long term (current) drug therapy: Secondary | ICD-10-CM | POA: Diagnosis not present

## 2018-04-03 DIAGNOSIS — Z888 Allergy status to other drugs, medicaments and biological substances status: Secondary | ICD-10-CM | POA: Diagnosis not present

## 2018-04-03 DIAGNOSIS — Z8249 Family history of ischemic heart disease and other diseases of the circulatory system: Secondary | ICD-10-CM | POA: Diagnosis not present

## 2018-04-03 DIAGNOSIS — F419 Anxiety disorder, unspecified: Secondary | ICD-10-CM | POA: Diagnosis present

## 2018-04-03 DIAGNOSIS — Y929 Unspecified place or not applicable: Secondary | ICD-10-CM | POA: Diagnosis not present

## 2018-04-03 DIAGNOSIS — S81011A Laceration without foreign body, right knee, initial encounter: Secondary | ICD-10-CM | POA: Diagnosis present

## 2018-04-03 DIAGNOSIS — G8929 Other chronic pain: Secondary | ICD-10-CM | POA: Diagnosis present

## 2018-04-03 DIAGNOSIS — I10 Essential (primary) hypertension: Secondary | ICD-10-CM

## 2018-04-03 DIAGNOSIS — N183 Chronic kidney disease, stage 3 (moderate): Secondary | ICD-10-CM | POA: Diagnosis present

## 2018-04-03 DIAGNOSIS — F329 Major depressive disorder, single episode, unspecified: Secondary | ICD-10-CM | POA: Diagnosis present

## 2018-04-03 DIAGNOSIS — R292 Abnormal reflex: Secondary | ICD-10-CM | POA: Diagnosis present

## 2018-04-03 DIAGNOSIS — G8194 Hemiplegia, unspecified affecting left nondominant side: Secondary | ICD-10-CM | POA: Diagnosis present

## 2018-04-03 DIAGNOSIS — Z833 Family history of diabetes mellitus: Secondary | ICD-10-CM | POA: Diagnosis not present

## 2018-04-03 DIAGNOSIS — R5381 Other malaise: Secondary | ICD-10-CM | POA: Diagnosis present

## 2018-04-03 DIAGNOSIS — Z7901 Long term (current) use of anticoagulants: Secondary | ICD-10-CM | POA: Diagnosis not present

## 2018-04-03 DIAGNOSIS — W19XXXA Unspecified fall, initial encounter: Secondary | ICD-10-CM | POA: Diagnosis present

## 2018-04-03 DIAGNOSIS — Z86718 Personal history of other venous thrombosis and embolism: Secondary | ICD-10-CM

## 2018-04-03 DIAGNOSIS — K219 Gastro-esophageal reflux disease without esophagitis: Secondary | ICD-10-CM | POA: Diagnosis present

## 2018-04-03 DIAGNOSIS — Y939 Activity, unspecified: Secondary | ICD-10-CM | POA: Diagnosis not present

## 2018-04-03 DIAGNOSIS — Z96643 Presence of artificial hip joint, bilateral: Secondary | ICD-10-CM | POA: Diagnosis present

## 2018-04-03 DIAGNOSIS — Z8614 Personal history of Methicillin resistant Staphylococcus aureus infection: Secondary | ICD-10-CM | POA: Diagnosis not present

## 2018-04-03 DIAGNOSIS — I129 Hypertensive chronic kidney disease with stage 1 through stage 4 chronic kidney disease, or unspecified chronic kidney disease: Secondary | ICD-10-CM | POA: Diagnosis present

## 2018-04-03 DIAGNOSIS — Z803 Family history of malignant neoplasm of breast: Secondary | ICD-10-CM | POA: Diagnosis not present

## 2018-04-03 DIAGNOSIS — N319 Neuromuscular dysfunction of bladder, unspecified: Secondary | ICD-10-CM | POA: Diagnosis present

## 2018-04-03 DIAGNOSIS — Z880 Allergy status to penicillin: Secondary | ICD-10-CM | POA: Diagnosis not present

## 2018-04-03 DIAGNOSIS — Z800 Family history of malignant neoplasm of digestive organs: Secondary | ICD-10-CM | POA: Diagnosis not present

## 2018-04-03 LAB — CBC WITH DIFFERENTIAL/PLATELET
Abs Immature Granulocytes: 0.05 10*3/uL (ref 0.00–0.07)
Basophils Absolute: 0 10*3/uL (ref 0.0–0.1)
Basophils Relative: 0 %
Eosinophils Absolute: 0 10*3/uL (ref 0.0–0.5)
Eosinophils Relative: 0 %
HEMATOCRIT: 37.9 % (ref 36.0–46.0)
Hemoglobin: 12.4 g/dL (ref 12.0–15.0)
Immature Granulocytes: 1 %
LYMPHS ABS: 0.4 10*3/uL — AB (ref 0.7–4.0)
Lymphocytes Relative: 5 %
MCH: 33.7 pg (ref 26.0–34.0)
MCHC: 32.7 g/dL (ref 30.0–36.0)
MCV: 103 fL — ABNORMAL HIGH (ref 80.0–100.0)
Monocytes Absolute: 0.1 10*3/uL (ref 0.1–1.0)
Monocytes Relative: 1 %
NEUTROS ABS: 8.1 10*3/uL — AB (ref 1.7–7.7)
Neutrophils Relative %: 93 %
Platelets: 363 10*3/uL (ref 150–400)
RBC: 3.68 MIL/uL — ABNORMAL LOW (ref 3.87–5.11)
RDW: 13.9 % (ref 11.5–15.5)
WBC: 8.7 10*3/uL (ref 4.0–10.5)
nRBC: 0 % (ref 0.0–0.2)

## 2018-04-03 LAB — BASIC METABOLIC PANEL
Anion gap: 9 (ref 5–15)
BUN: 13 mg/dL (ref 6–20)
CO2: 22 mmol/L (ref 22–32)
Calcium: 8.9 mg/dL (ref 8.9–10.3)
Chloride: 107 mmol/L (ref 98–111)
Creatinine, Ser: 1.36 mg/dL — ABNORMAL HIGH (ref 0.44–1.00)
GFR calc Af Amer: 57 mL/min — ABNORMAL LOW (ref >60)
GFR calc non Af Amer: 49 mL/min — ABNORMAL LOW (ref >60)
GLUCOSE: 132 mg/dL — AB (ref 70–99)
Potassium: 4.3 mmol/L (ref 3.5–5.1)
Sodium: 138 mmol/L (ref 135–145)

## 2018-04-03 LAB — COMPREHENSIVE METABOLIC PANEL
ALT: 13 U/L (ref 0–44)
AST: 15 U/L (ref 15–41)
Albumin: 3.9 g/dL (ref 3.5–5.0)
Alkaline Phosphatase: 68 U/L (ref 38–126)
Anion gap: 6 (ref 5–15)
BUN: 15 mg/dL (ref 6–20)
CO2: 25 mmol/L (ref 22–32)
Calcium: 9.2 mg/dL (ref 8.9–10.3)
Chloride: 107 mmol/L (ref 98–111)
Creatinine, Ser: 1.47 mg/dL — ABNORMAL HIGH (ref 0.44–1.00)
GFR calc Af Amer: 52 mL/min — ABNORMAL LOW (ref >60)
GFR calc non Af Amer: 45 mL/min — ABNORMAL LOW (ref >60)
Glucose, Bld: 137 mg/dL — ABNORMAL HIGH (ref 70–99)
POTASSIUM: 4.1 mmol/L (ref 3.5–5.1)
Sodium: 138 mmol/L (ref 135–145)
Total Bilirubin: 0.4 mg/dL (ref 0.3–1.2)
Total Protein: 7.5 g/dL (ref 6.5–8.1)

## 2018-04-03 LAB — CBC
HCT: 40 % (ref 36.0–46.0)
Hemoglobin: 13.1 g/dL (ref 12.0–15.0)
MCH: 33.9 pg (ref 26.0–34.0)
MCHC: 32.8 g/dL (ref 30.0–36.0)
MCV: 103.4 fL — ABNORMAL HIGH (ref 80.0–100.0)
Platelets: 298 10*3/uL (ref 150–400)
RBC: 3.87 MIL/uL (ref 3.87–5.11)
RDW: 13.8 % (ref 11.5–15.5)
WBC: 8.6 10*3/uL (ref 4.0–10.5)
nRBC: 0 % (ref 0.0–0.2)

## 2018-04-03 LAB — URINALYSIS, ROUTINE W REFLEX MICROSCOPIC
Bilirubin Urine: NEGATIVE
Glucose, UA: NEGATIVE mg/dL
Hgb urine dipstick: NEGATIVE
Ketones, ur: NEGATIVE mg/dL
Leukocytes, UA: NEGATIVE
Nitrite: NEGATIVE
Protein, ur: NEGATIVE mg/dL
Specific Gravity, Urine: 1.012 (ref 1.005–1.030)
pH: 7 (ref 5.0–8.0)

## 2018-04-03 LAB — I-STAT BETA HCG BLOOD, ED (MC, WL, AP ONLY): I-stat hCG, quantitative: <5 m[IU]/mL (ref <5)

## 2018-04-03 MED ORDER — CITALOPRAM HYDROBROMIDE 20 MG PO TABS
20.0000 mg | ORAL_TABLET | Freq: Every morning | ORAL | Status: DC
  Administered 2018-04-03 – 2018-04-05 (×3): 20 mg via ORAL
  Filled 2018-04-03: qty 1
  Filled 2018-04-03: qty 2
  Filled 2018-04-03: qty 1

## 2018-04-03 MED ORDER — PANTOPRAZOLE SODIUM 40 MG PO TBEC
40.0000 mg | DELAYED_RELEASE_TABLET | Freq: Every day | ORAL | Status: DC
  Administered 2018-04-03 – 2018-04-05 (×3): 40 mg via ORAL
  Filled 2018-04-03 (×3): qty 1

## 2018-04-03 MED ORDER — AMLODIPINE BESYLATE 5 MG PO TABS
5.0000 mg | ORAL_TABLET | Freq: Every day | ORAL | Status: DC
  Administered 2018-04-03 – 2018-04-05 (×3): 5 mg via ORAL
  Filled 2018-04-03 (×3): qty 1

## 2018-04-03 MED ORDER — OXCARBAZEPINE 300 MG PO TABS
300.0000 mg | ORAL_TABLET | Freq: Two times a day (BID) | ORAL | Status: DC
  Administered 2018-04-03 – 2018-04-05 (×5): 300 mg via ORAL
  Filled 2018-04-03 (×6): qty 1

## 2018-04-03 MED ORDER — ADULT MULTIVITAMIN W/MINERALS CH
1.0000 | ORAL_TABLET | Freq: Every day | ORAL | Status: DC
  Administered 2018-04-03 – 2018-04-05 (×3): 1 via ORAL
  Filled 2018-04-03 (×3): qty 1

## 2018-04-03 MED ORDER — SODIUM CHLORIDE 0.9 % IV BOLUS
1000.0000 mL | Freq: Once | INTRAVENOUS | Status: AC
  Administered 2018-04-03: 1000 mL via INTRAVENOUS

## 2018-04-03 MED ORDER — SODIUM CHLORIDE 0.9 % IV SOLN
1000.0000 mg | Freq: Once | INTRAVENOUS | Status: AC
  Administered 2018-04-03: 1000 mg via INTRAVENOUS
  Filled 2018-04-03: qty 8

## 2018-04-03 MED ORDER — BUSPIRONE HCL 5 MG PO TABS
15.0000 mg | ORAL_TABLET | Freq: Two times a day (BID) | ORAL | Status: DC
  Administered 2018-04-03 – 2018-04-05 (×5): 15 mg via ORAL
  Filled 2018-04-03: qty 1
  Filled 2018-04-03: qty 2
  Filled 2018-04-03 (×3): qty 1

## 2018-04-03 MED ORDER — GABAPENTIN 400 MG PO CAPS
800.0000 mg | ORAL_CAPSULE | Freq: Four times a day (QID) | ORAL | Status: DC
  Administered 2018-04-03 – 2018-04-05 (×11): 800 mg via ORAL
  Filled 2018-04-03 (×11): qty 2

## 2018-04-03 MED ORDER — SUMATRIPTAN SUCCINATE 50 MG PO TABS
100.0000 mg | ORAL_TABLET | Freq: Once | ORAL | Status: AC
  Administered 2018-04-03: 100 mg via ORAL
  Filled 2018-04-03: qty 2

## 2018-04-03 MED ORDER — ONDANSETRON HCL 4 MG/2ML IJ SOLN: 4.0000 mg | Freq: Four times a day (QID) | INTRAMUSCULAR | Status: DC | PRN

## 2018-04-03 MED ORDER — DIMETHYL FUMARATE 240 MG PO CPDR: 240.0000 mg | DELAYED_RELEASE_CAPSULE | Freq: Two times a day (BID) | ORAL | Status: DC

## 2018-04-03 MED ORDER — METOPROLOL TARTRATE 12.5 MG HALF TABLET
12.5000 mg | ORAL_TABLET | Freq: Two times a day (BID) | ORAL | Status: DC
  Administered 2018-04-03 – 2018-04-05 (×6): 12.5 mg via ORAL
  Filled 2018-04-03 (×6): qty 1

## 2018-04-03 MED ORDER — AMANTADINE HCL 100 MG PO CAPS
100.0000 mg | ORAL_CAPSULE | Freq: Two times a day (BID) | ORAL | Status: DC
  Administered 2018-04-03 – 2018-04-05 (×5): 100 mg via ORAL
  Filled 2018-04-03 (×6): qty 1

## 2018-04-03 MED ORDER — OXYCODONE-ACETAMINOPHEN 10-325 MG PO TABS: 1.0000 | ORAL_TABLET | ORAL | Status: DC

## 2018-04-03 MED ORDER — DIPHENHYDRAMINE HCL 25 MG PO CAPS
25.0000 mg | ORAL_CAPSULE | Freq: Three times a day (TID) | ORAL | Status: DC | PRN
  Administered 2018-04-03: 25 mg via ORAL
  Filled 2018-04-03: qty 1

## 2018-04-03 MED ORDER — TIZANIDINE HCL 4 MG PO TABS
4.0000 mg | ORAL_TABLET | Freq: Every day | ORAL | Status: DC
  Administered 2018-04-03 – 2018-04-04 (×2): 4 mg via ORAL
  Filled 2018-04-03 (×2): qty 1

## 2018-04-03 MED ORDER — ACETAMINOPHEN 325 MG PO TABS: 650.0000 mg | ORAL_TABLET | Freq: Four times a day (QID) | ORAL | Status: DC | PRN

## 2018-04-03 MED ORDER — VALACYCLOVIR HCL 500 MG PO TABS
500.0000 mg | ORAL_TABLET | Freq: Two times a day (BID) | ORAL | Status: DC
  Administered 2018-04-03 – 2018-04-05 (×5): 500 mg via ORAL
  Filled 2018-04-03 (×6): qty 1

## 2018-04-03 MED ORDER — DALFAMPRIDINE ER 10 MG PO TB12
10.0000 mg | ORAL_TABLET | Freq: Two times a day (BID) | ORAL | Status: DC
  Administered 2018-04-03 – 2018-04-05 (×5): 10 mg via ORAL

## 2018-04-03 MED ORDER — ACETAMINOPHEN 650 MG RE SUPP: 650.0000 mg | Freq: Four times a day (QID) | RECTAL | Status: DC | PRN

## 2018-04-03 MED ORDER — SODIUM CHLORIDE 0.9 % IV SOLN
1000.0000 mg | Freq: Every day | INTRAVENOUS | Status: AC
  Administered 2018-04-04 – 2018-04-05 (×2): 1000 mg via INTRAVENOUS
  Filled 2018-04-03 (×2): qty 8

## 2018-04-03 MED ORDER — OXYCODONE-ACETAMINOPHEN 5-325 MG PO TABS
1.0000 | ORAL_TABLET | ORAL | Status: DC
  Administered 2018-04-03 – 2018-04-05 (×14): 1 via ORAL
  Filled 2018-04-03 (×15): qty 1

## 2018-04-03 MED ORDER — AMPHETAMINE-DEXTROAMPHET ER 20 MG PO CP24
20.0000 mg | ORAL_CAPSULE | Freq: Every day | ORAL | Status: DC
  Administered 2018-04-03 – 2018-04-05 (×3): 20 mg via ORAL
  Filled 2018-04-03 (×2): qty 1
  Filled 2018-04-03: qty 2

## 2018-04-03 MED ORDER — BACLOFEN 20 MG PO TABS
20.0000 mg | ORAL_TABLET | Freq: Four times a day (QID) | ORAL | Status: DC
  Administered 2018-04-03 – 2018-04-05 (×11): 20 mg via ORAL
  Filled 2018-04-03 (×7): qty 1
  Filled 2018-04-03: qty 2
  Filled 2018-04-03 (×3): qty 1

## 2018-04-03 MED ORDER — OXYCODONE HCL 5 MG PO TABS
5.0000 mg | ORAL_TABLET | ORAL | Status: DC
  Administered 2018-04-03 – 2018-04-05 (×14): 5 mg via ORAL
  Filled 2018-04-03 (×15): qty 1

## 2018-04-03 MED ORDER — TAMSULOSIN HCL 0.4 MG PO CAPS
0.4000 mg | ORAL_CAPSULE | Freq: Every day | ORAL | Status: DC
  Administered 2018-04-03 – 2018-04-05 (×3): 0.4 mg via ORAL
  Filled 2018-04-03 (×3): qty 1

## 2018-04-03 MED ORDER — AMITRIPTYLINE HCL 25 MG PO TABS
50.0000 mg | ORAL_TABLET | Freq: Every day | ORAL | Status: DC
  Administered 2018-04-03 – 2018-04-04 (×2): 50 mg via ORAL
  Filled 2018-04-03 (×2): qty 2

## 2018-04-03 MED ORDER — APIXABAN 5 MG PO TABS
5.0000 mg | ORAL_TABLET | Freq: Two times a day (BID) | ORAL | Status: DC
  Administered 2018-04-03 – 2018-04-05 (×5): 5 mg via ORAL
  Filled 2018-04-03 (×5): qty 1

## 2018-04-03 MED ORDER — LAMOTRIGINE 100 MG PO TABS: 200.0000 mg | ORAL_TABLET | Freq: Two times a day (BID) | ORAL | Status: DC

## 2018-04-03 MED ORDER — ONDANSETRON HCL 4 MG PO TABS
4.0000 mg | ORAL_TABLET | Freq: Four times a day (QID) | ORAL | Status: DC | PRN
  Administered 2018-04-03: 4 mg via ORAL
  Filled 2018-04-03: qty 1

## 2018-04-03 NOTE — ED Notes (Signed)
Pt aware urine sample is needed, unable to provide one at this time 

## 2018-04-03 NOTE — H&P (Signed)
History and Physical    [name] NTI:[id number] DOB: 12-10-1980 DOA: 04/02/2018  PCP: System, Pcp Not In  Patient coming from: Home.  Chief Complaint: Left-sided weakness.  HPI: Dawn Peters is a 38 y.o. female with history of multiple sclerosis was recently discharged home on March 15, 2018 since I last visited her neurologist Dr. Felecia Shelling and her Melchor Amour was discontinued in view of starting Ocrevus presents to the ER because of weakness of the left side more on the left lower extremity last 3 days.  Patient also has been having some falls but denies hitting head or losing consciousness.  Over the last 3 days her left lower extremity is progressively became weak and unable to move at this time.  Last night she had another fall prompting her to come to the ER.  Denies any fever chills chest pain productive cough.  ED Course: In the ER patient was afebrile.  UA was unremarkable.  ER physician discussed with Dr. Lorraine Lax on-call neurologist who at this time recommended methylprednisolone 1 g x 3 days.  On exam patient does have a small skin tear on the right knee from the fall.  No obvious effusion.  Able to move her right side.  Left lower extremity is weak almost 0 x 5 in strength in left upper extremity is 3 x 5 in strength.  Denies neck pain.  Review of Systems: As per HPI, rest all negative.   Past Medical History:  Diagnosis Date  . Anticoagulant long-term use    Eliquis  . Anxiety   . Chronic pain   . CKD (chronic kidney disease), stage III ([place])   . Depression   . 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 Gait disturbance    due to MS  . GERD (gastroesophageal reflux disease)    watch diet  . 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 History of DVT of lower extremity 2007  . History of encephalopathy 86/7619   acute metabolic  encephalopathy secondary to MS,  resolved  . History of MRSA infection 2004   right hip infection post THA  . History of pulmonary embolus (PE) 2005  . Hydronephrosis, left    w/ acute kidney injury 02/ 2019  . Hypertension   . Left-sided weakness    due to MS  . Migraines   . MS (multiple sclerosis) [facility]) neurologist-  dr Renne Musca   dx 2000--  . Muscle spasticity   . Neurogenic bladder    due to MS  . Pulmonary embolism, bilateral ([place]) 09/17/2017   treated w/ eliquis  . Strains to urinate   . Urgency of urination     Past Surgical History:  Procedure Laterality Date  . CYSTOSCOPY WITH RETROGRADE PYELOGRAM, URETEROSCOPY AND STENT PLACEMENT Bilateral 11/29/2017   Procedure: BILATERAL  RETROGRADE PYELOGRAM, LEFT DIAGNOSIC URETEROSCOPY AND STENT LEFT PLACEMENT;  Surgeon: Ardis Hughs, MD;  Location: [facility];  Service: Urology;  Laterality: Bilateral;  . HIP ARTHROSCOPY Left 07-05-2013   dr pill @[facility]    iliopsosas release, synovectomy  . REVISION TOTAL HIP ARTHROPLASTY Right 03-28-2003    dr Alvan Dame @[facility]   . TOTAL HIP ARTHROPLASTY Bilateral left 11-05-2002  dr Alvan Dame @[facility] ;   right 06/ 2004  @[facility]      reports that she has never smoked. She has never used smokeless tobacco. She reports  that she does not drink alcohol or use drugs.  Allergies  Allergen Reactions  . Ace Inhibitors Swelling  . Amoxicillin Itching    Did it involve swelling of the face/tongue/throat, SOB, or low BP? Yes Did it involve sudden or severe rash/hives, skin peeling, or any reaction on the inside of your mouth or nose? No Did you need to seek medical attention at a hospital or doctor's office? No When did it last happen?unknown  If all above answers are "NO", may proceed with cephalosporin use.;  . Lisinopril Swelling    Lower lip swelling    Family History  Problem Relation Age of Onset  . Healthy Mother   . Healthy Father   . Breast cancer Other        paternal  grandmother dx age 68  . Colon cancer Other        paternal grandmother  . Hypertension Other   . Diabetes Other     Prior to Admission medications   Medication Sig Start Date End Date Taking? Authorizing Provider  amantadine (SYMMETREL) 100 MG capsule Take 100 mg by mouth 2 (two) times daily. 09/11/17  Yes [provider]  amitriptyline (ELAVIL) 25 MG tablet TAKE 1-2 TABLETS BY MOUTH AT BEDTIME Patient taking differently: Take 50 mg by mouth at bedtime.  02/26/18  Yes Sater, Nanine Means, MD  amLODipine (NORVASC) 5 MG tablet Take 1 tablet (5 mg total) by mouth daily. Patient taking differently: Take 5 mg by mouth daily after breakfast.  05/21/17  Yes Arrien, Jimmy Picket, MD  amphetamine-dextroamphetamine (ADDERALL XR) 20 MG 24 hr capsule Take 1 capsule (20 mg total) by mouth daily. 03/26/18  Yes Sater, Nanine Means, MD  apixaban (ELIQUIS) 5 MG TABS tablet Take 1 tablet (5 mg total) by mouth 2 (two) times daily. 09/26/17  Yes Kayleen Memos, DO  baclofen (LIORESAL) 20 MG tablet Take 1 tablet (20 mg total) by mouth 3 (three) times daily as needed (for ms). Patient taking differently: Take 20 mg by mouth 4 (four) times daily.  08/03/17  Yes Sater, Nanine Means, MD  busPIRone (BUSPAR) 15 MG tablet Take 1 tablet (15 mg total) by mouth 2 (two) times daily. 08/02/17  Yes Sater, Nanine Means, MD  citalopram (CELEXA) 20 MG tablet Take 20 mg by mouth every morning.  09/11/17  Yes [provider]  dalfampridine 10 MG TB12 Take 1 tablet (10 mg total) by mouth 2 (two) times daily. Patient taking differently: Take 10 mg by mouth every 12 (twelve) hours.  08/18/17  Yes Sater, Nanine Means, MD  Dimethyl Fumarate (TECFIDERA) 240 MG CPDR Take 240 mg by mouth 2 (two) times daily.    Yes [provider]  gabapentin (NEURONTIN) 800 MG tablet Take 1 tablet (800 mg total) by mouth 3 (three) times daily. Patient taking differently: Take 800 mg by mouth 4 (four) times daily.  10/19/17  Yes Sater, Nanine Means, MD   lamoTRIgine (LAMICTAL) 200 MG tablet Take 1 tablet (200 mg total) by mouth 2 (two) times daily. 08/05/16  Yes Sater, Nanine Means, MD  metoprolol tartrate (LOPRESSOR) 25 MG tablet Take 0.5 tablets (12.5 mg total) by mouth 2 (two) times daily. 05/21/17 04/03/18 Yes Arrien, Jimmy Picket, MD  Multiple Vitamin (MULTIVITAMIN WITH MINERALS) TABS tablet Take 1 tablet by mouth daily. 09/20/17  Yes Kayleen Memos, DO  Oxcarbazepine (TRILEPTAL) 300 MG tablet Take 1 tablet (300 mg total) by mouth 2 (two) times daily. 11/11/15  Yes Sater, Nanine Means,  MD  oxyCODONE-acetaminophen (PERCOCET) 10-325 MG tablet Take 1 tablet by mouth every 4 (four) hours. 03/18/18  Yes [provider]  SUMAtriptan (IMITREX) 100 MG tablet Take 1 tablet (100 mg total) by mouth once as needed for migraine. May repeat in 2 hours if headache persists or recurs. 12/28/16  Yes Sater, Nanine Means, MD  tamsulosin (FLOMAX) 0.4 MG CAPS capsule Take 1 capsule (0.4 mg total) by mouth daily. Patient taking differently: Take 0.4 mg by mouth daily after breakfast.  09/20/17  Yes Sater, Nanine Means, MD  tiZANidine (ZANAFLEX) 4 MG tablet TAKE 1 TABLET BY MOUTH AT BEDTIME Patient taking differently: Take 4 mg by mouth at bedtime.  03/26/18  Yes Sater, Nanine Means, MD  valACYclovir (VALTREX) 500 MG tablet Take 1 tablet (500 mg total) by mouth 2 (two) times daily. 03/20/17  Yes Sater, Nanine Means, MD  polyethylene glycol (MIRALAX / GLYCOLAX) packet Take 17 g by mouth daily. Patient not taking: Reported on 04/03/2018 03/16/18   Florencia Reasons, MD  senna-docusate (SENOKOT-S) 8.6-50 MG tablet Take 1 tablet by mouth at bedtime. Patient not taking: Reported on 04/03/2018 03/15/18   Florencia Reasons, MD    Physical Exam: Vitals:   04/02/18 2211 04/02/18 2330 04/03/18 0130 04/03/18 0230  BP:  (!) 132/92 (!) 143/104 (!) 166/104  Pulse:  91 87 72  Resp:  (!) 22 (!) 21 15  Temp:      TempSrc:      SpO2:  97% (!) 17% 97%  Weight: 95.7 kg     Height: 5\' 6"  (1.676 m)          Constitutional: Moderately built and nourished. Vitals:   04/02/18 2211 04/02/18 2330 04/03/18 0130 04/03/18 0230  BP:  (!) 132/92 (!) 143/104 (!) 166/104  Pulse:  91 87 72  Resp:  (!) 22 (!) 21 15  Temp:      TempSrc:      SpO2:  97% (!) 17% 97%  Weight: 95.7 kg     Height: 5\' 6"  (1.676 m)      Eyes: Anicteric no pallor. ENMT: No discharge from the ears eyes nose or mouth. Neck: No mass felt.  No neck rigidity. Respiratory: No rhonchi or crepitations. Cardiovascular: S1-S2 heard. Abdomen: Soft nontender bowel sounds present. Musculoskeletal: No edema.  No joint effusion. Skin: Small skin tear on the right knee. Neurologic: Alert awake oriented to time place and person.  Left lower extremity is 0 x 5 and strength in left upper extremity is 3 x 5 in strength.  Right upper and lower extremities 5 x 5 in strength. Psychiatric: Appears normal with a normal affect.   Labs on Admission: I have personally reviewed following labs and imaging studies  CBC: Recent Labs  Lab 04/03/18 0035  WBC 8.7  NEUTROABS 8.1*  HGB 12.4  HCT 37.9  MCV 103.0*  PLT 151   Basic Metabolic Panel: Recent Labs  Lab 04/03/18 0035  NA 138  K 4.1  CL 107  CO2 25  GLUCOSE 137*  BUN 15  CREATININE 1.47*  CALCIUM 9.2   GFR: Estimated Creatinine Clearance: 59.9 mL/min (A) (by C-G formula based on SCr of 1.47 mg/dL (H)). Liver Function Tests: Recent Labs  Lab 04/03/18 0035  AST 15  ALT 13  ALKPHOS 68  BILITOT 0.4  PROT 7.5  ALBUMIN 3.9   No results for input(s): LIPASE, AMYLASE in the last 168 hours. No results for input(s): AMMONIA in the last 168 hours. Coagulation Profile: No  results for input(s): INR, PROTIME in the last 168 hours. Cardiac Enzymes: No results for input(s): CKTOTAL, CKMB, CKMBINDEX, TROPONINI in the last 168 hours. BNP (last 3 results) No results for input(s): PROBNP in the last 8760 hours. HbA1C: No results for input(s): HGBA1C in the last 72  hours. CBG: No results for input(s): GLUCAP in the last 168 hours. Lipid Profile: No results for input(s): CHOL, HDL, LDLCALC, TRIG, CHOLHDL, LDLDIRECT in the last 72 hours. Thyroid Function Tests: No results for input(s): TSH, T4TOTAL, FREET4, T3FREE, THYROIDAB in the last 72 hours. Anemia Panel: No results for input(s): VITAMINB12, FOLATE, FERRITIN, TIBC, IRON, RETICCTPCT in the last 72 hours. Urine analysis:    Component Value Date/Time   COLORURINE YELLOW 04/03/2018 0212   APPEARANCEUR CLEAR 04/03/2018 0212   APPEARANCEUR Clear 06/21/2017 1650   LABSPEC 1.012 04/03/2018 0212   PHURINE 7.0 04/03/2018 0212   GLUCOSEU NEGATIVE 04/03/2018 0212   HGBUR NEGATIVE 04/03/2018 0212   BILIRUBINUR NEGATIVE 04/03/2018 0212   BILIRUBINUR Negative 06/21/2017 1650   KETONESUR NEGATIVE 04/03/2018 0212   PROTEINUR NEGATIVE 04/03/2018 0212   UROBILINOGEN 0.2 12/15/2016 1136   NITRITE NEGATIVE 04/03/2018 0212   LEUKOCYTESUR NEGATIVE 04/03/2018 0212   LEUKOCYTESUR Negative 06/21/2017 1650   Sepsis Labs: @LABRCNTIP (procalcitonin:4,lacticidven:4) )No results found for this or any previous visit (from the past 240 hour(s)).   Radiological Exams on Admission: No results found.    Assessment/Plan Principal Problem:   Multiple sclerosis exacerbation (HCC) Active Problems:   Essential hypertension   DVT, HX OF   Pulmonary embolism (HCC)   CKD (chronic kidney disease), stage III ([place])    1. Multiple sclerosis exacerbation -ER physician had discussed with Dr. Lorraine Lax on-call neurologist who recommended 1 g methylprednisolone into 3 days.  Which has been started.  2. Hypertension on metoprolol and Norvasc. 3. 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 4. Chronic kidney disease stage III creatinine appears to be at baseline. 5. History of left-sided hydronephrosis requiring stent previously of stent now. 6. Chronic pain on with Percocet. 7. Anxiety we will continue present home medications. 8. Herpes  prophylaxis on valacyclovir.  Since patient has significant weakness and difficulty ambulating with left-sided hemiparesis requiring IV steroids and physical therapy patient will be admitted for inpatient.   DVT prophylaxis: Apixaban. Code Status: Full code. Family Communication: Discussed with patient. Disposition Plan: To be determined. Consults called: ER physician discussed with neurologist.  Will consult physical therapy. Admission status: Inpatient.   Rise Patience MD [facility] Pager [phone].  If 7PM-7AM, please contact night-coverage [email or web address] Password [email or web address]  04/03/2018, 4:08 AM

## 2018-04-03 NOTE — Telephone Encounter (Signed)
Labs okay per Dr. Felecia Shelling. Gave completed/signed start form to intrafusion to process for pt.

## 2018-04-03 NOTE — Progress Notes (Signed)
I have seen and assessed the patient and I agree with Dr. Moise Boring assessment and plan.  Patient is a 38 year old female history of multiple sclerosis recently discharged on March 15, 2018 and being followed by her neurologist, Dr. Felecia Shelling.  Patient's Tecfidera was discontinued in view of starting Ocrevus presents to the ED due to left-sided weakness x3 days with increased falls.  Patient admitted for a exacerbation of multiple sclerosis.  Patient placed on methylprednisone 1 g IV daily x3 days.  Neurology consulted.  No charge.

## 2018-04-03 NOTE — ED Notes (Signed)
Social work made aware patient's mother is requesting to speak with her.

## 2018-04-03 NOTE — ED Notes (Signed)
Patient's mother, Hoyle Sauer [phone]

## 2018-04-03 NOTE — Care Management Note (Addendum)
Case Management Note  Patient Details  Name: Dawn Peters MRN: [id number] Date of Birth: 22-Nov-1980  Subjective/Objective:                  Per md and patient request from home dme  Action/Plan: [facility] sent to [facility] New rolling walker not elig. Got a rollator in April of 2019 per karen byrd [employer] cane Power chair must be obtained through the pcp office per [place]  Expected Discharge Date:  (unknown)               Expected Discharge Plan:  Home/Self Care  In-House Referral:     Discharge planning Services  CM Consult  Post Acute Care Choice:  Durable Medical Equipment Choice offered to:  Patient  DME Arranged:  Gilford Rile rolling, Kasandra Knudsen, Other see comment DME Agency:  [place]:    [place]:     Status of Service:     If discussed at [place] of Stay Meetings, dates discussed:    Additional Comments:  Leeroy Cha, RN 04/03/2018, 3:50 PM

## 2018-04-03 NOTE — Progress Notes (Signed)
Pt complaining of new onset of itching. Noticed that the itching was mostly around the telemetry electrodes. Asked RN if she could take box off to get some relief for the night. RN informed pt of the importance of telemetry, but the pt insisted she would take it off for tonight and put it back on in the morning. PRN benadryl also given to help with itching.

## 2018-04-03 NOTE — Consult Note (Signed)
Neurology Consultation Reason for Consult: Leg weakness Referring Physician: Hal Hope, a  CC: Leg weakness  History is obtained from: Patient, mother  HPI: Dawn Peters is a 38 y.o. female with a history of multiple sclerosis which was diagnosed by imaging and clinical course in the year 2000.  Since that time she has been on multiple disease modifying agents including Betaseron, [name], Tysabri, and most recently Tecfidera.  She has had multiple admissions for left-sided weakness, frequently with negative imaging.  She did have mild contrast enhancement on the thoracic spine MRI in June, 2018 but that was the only evidence of active disease that we have seen on imaging.  She frequently has concerns that her left leg is worsening, and feels that steroids are helpful, but her mother states that she does not really see much difference between before and after the steroids.  Most recently she was admitted earlier this month with complaints about left leg weakness and received 3 days of IV Solu-Medrol.   ROS: A 14 point ROS was performed and is negative except as noted in the HPI.   Past Medical History:  Diagnosis Date  . Anticoagulant long-term use    Eliquis  . Anxiety   . Chronic pain   . CKD (chronic kidney disease), stage III ([place])   . Depression   . DVT, lower extremity, recurrent ([place]) 09/17/2017   left lower extremity-- treated with eliquis  . Gait disturbance    due to MS  . GERD (gastroesophageal reflux disease)    watch diet  . 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 History of DVT of lower extremity 2007  . History of encephalopathy 14/4818   acute metabolic encephalopathy secondary to MS,  resolved  . History of MRSA infection 2004   right hip infection post THA  . History of pulmonary embolus (PE) 2005  . Hydronephrosis, left    w/ acute kidney injury 02/ 2019  . Hypertension   .  Left-sided weakness    due to MS  . Migraines   . MS (multiple sclerosis) [facility]) neurologist-  dr Renne Musca   dx 2000--  . Muscle spasticity   . Neurogenic bladder    due to MS  . Pulmonary embolism, bilateral ([place]) 09/17/2017   treated w/ eliquis  . Strains to urinate   . Urgency of urination      Family History  Problem Relation Age of Onset  . Healthy Mother   . Healthy Father   . Breast cancer Other        paternal grandmother dx age 44  . Colon cancer Other        paternal grandmother  . Hypertension Other   . Diabetes Other      Social History:  reports that she has never smoked. She has never used smokeless tobacco. She reports that she does not drink alcohol or use drugs.   Exam: Current vital signs: BP (!) 162/109   Pulse 68   Temp 98.6 F (37 C) (Oral)   Resp 15   Ht 5\' 6"  (1.676 m)   Wt 95.7 kg   LMP 03/13/2018   SpO2 97%   BMI 34.06 kg/m  Vital signs in last 24 hours: Temp:  [98.6 F (37 C)] 98.6 F (37 C) (01/27 2210) Pulse Rate:  [66-94] 68 (01/28 0700) Resp:  [15-22] 15 (01/28 0700) BP: (125-171)/(88-109) 162/109 (01/28 0700) SpO2:  [  17 %-100 %] 97 % (01/28 0700) Weight:  [95.7 kg] 95.7 kg (01/27 2211)   Physical Exam  Constitutional: Appears well-developed and well-nourished.  Psych: Affect appropriate to situation Eyes: No scleral injection HENT: No OP obstrucion Head: Normocephalic.  Cardiovascular: Normal rate and regular rhythm.  Respiratory: Effort normal, non-labored breathing GI: Soft.  No distension. There is no tenderness.  Skin: WDI  Neuro: Mental Status: Patient is awake, alert, oriented to person, place, month, year, and situation. Patient is able to give a clear and coherent history. No signs of aphasia or neglect Cranial Nerves: II: Visual Fields are full. Pupils are equal, round, and reactive to light.   III,IV, VI: EOMI without ptosis or diploplia.  V: Facial sensation is symmetric to temperature VII: Facial  movement is symmetric.  VIII: hearing is intact to voice X: Uvula elevates symmetrically XI: Shoulder shrug is symmetric. XII: tongue is midline without atrophy or fasciculations.  Motor: Tone is normal. Bulk is normal. 5/5 strength was present in bilateral arms, though there is mild drift in the left upper extremity, and lower extremities, she has good distal strength in her left leg, and there does appear to be slightly inconsistent weakness at her left hip.  She is unable to lift her leg out of the bed, however when I pick up her leg and asked her to bend her knee she flexes at the hip with strength that should be sufficient to lift her leg.. Sensory: Sensation is symmetric to light touch and temperature in the arms and legs. Deep Tendon Reflexes: She is slightly more hyperreflexic in the left patellar reflex on the right  Cerebellar: No clear ataxia    I have reviewed labs in epic and the results pertinent to this consultation are: CMP-unremarkable other than mild renal insufficiency  I have reviewed the images obtained: MRI brain, C-spine, T-spine from earlier this month, no enhancement  Impression: 38 year old female with multiple sclerosis who presents with worsening left leg weakness in the setting of having recently received IV Solu-Medrol.  I am not sure that I would do a 5 day new course of IV Solu-Medrol, however since she only received 3 days previously, I think it would be reasonable to do additional 2 days of Solu-Medrol to complete a 5-day course.   I think there is significant possibility that this represents a pseudo-exacerbation, but she does have very renal disease as well.  Recommendations: 1) IV Solu-Medrol today and tomorrow to complete 5-day course 2) physical therapy   Roland Rack, MD [facility] [phone]  If 7pm- 7am, please page neurology on call as listed in [place].

## 2018-04-04 ENCOUNTER — Encounter: Payer: Medicare Other | Admitting: Physical Therapy

## 2018-04-04 LAB — BASIC METABOLIC PANEL
Anion gap: 6 (ref 5–15)
BUN: 16 mg/dL (ref 6–20)
CO2: 27 mmol/L (ref 22–32)
Calcium: 9.1 mg/dL (ref 8.9–10.3)
Chloride: 104 mmol/L (ref 98–111)
Creatinine, Ser: 1.37 mg/dL — ABNORMAL HIGH (ref 0.44–1.00)
GFR calc non Af Amer: 48 mL/min — ABNORMAL LOW (ref >60)
GFR, EST AFRICAN AMERICAN: 56 mL/min — AB (ref >60)
Glucose, Bld: 135 mg/dL — ABNORMAL HIGH (ref 70–99)
Potassium: 4.1 mmol/L (ref 3.5–5.1)
SODIUM: 137 mmol/L (ref 135–145)

## 2018-04-04 LAB — CBC
HCT: 37.9 % (ref 36.0–46.0)
Hemoglobin: 12.1 g/dL (ref 12.0–15.0)
MCH: 32.8 pg (ref 26.0–34.0)
MCHC: 31.9 g/dL (ref 30.0–36.0)
MCV: 102.7 fL — ABNORMAL HIGH (ref 80.0–100.0)
Platelets: 371 10*3/uL (ref 150–400)
RBC: 3.69 MIL/uL — ABNORMAL LOW (ref 3.87–5.11)
RDW: 14.1 % (ref 11.5–15.5)
WBC: 16.2 10*3/uL — ABNORMAL HIGH (ref 4.0–10.5)
nRBC: 0 % (ref 0.0–0.2)

## 2018-04-04 MED ORDER — LIP MEDEX EX OINT
TOPICAL_OINTMENT | CUTANEOUS | Status: DC | PRN
  Filled 2018-04-04: qty 7

## 2018-04-04 NOTE — NC FL2 (Signed)
[place] LEVEL OF CARE SCREENING TOOL     IDENTIFICATION  Patient Name: Dawn Peters Birthdate: [birth year] Sex: female Admission Date (Current Location): 04/02/2018  [facility] [place] Number:  Herbalist and Address:  [facility],  [place] [address] [place]      Provider Number: [id number]  Attending Physician Name and Address:  Roney Jaffe, MD  Relative Name and Phone Number:       Current Level of Care: Hospital Recommended Level of Care: [place] Prior Approval Number:    Date Approved/Denied:   PASRR Number: [id number]  Discharge Plan: SNF    Current Diagnoses: Patient Active Problem List   Diagnosis Date Noted  . MS (multiple sclerosis) ([place]) 03/14/2018  . CKD (chronic kidney disease), stage III ([place])   . Obesity (BMI 30.0-34.9)   . Anemia 01/09/2018  . Pulmonary embolism ([place]) 09/18/2017  . Elevated serum creatinine 05/12/2017  . Acute encephalopathy 04/20/2017  . Hydronephrosis of left kidney 10/24/2016  . Attention deficit 10/24/2016  . Left leg weakness 08/13/2016  . Chronic pain 08/13/2016  . Mild renal insufficiency 08/13/2016  . Left-sided weakness   . High risk medication use 09/15/2015  . Hip pain, bilateral 07/28/2015  . Urinary hesitancy 07/28/2015  . Multiple sclerosis exacerbation ([place]) 07/17/2015  . Urinary disorder 06/11/2015  . Gait disturbance 05/19/2015  . Numbness 05/19/2015  . Depression with anxiety 05/13/2015  . Other fatigue 05/13/2015  . Left knee pain 05/13/2015  . Avascular necrosis of bones of both hips ([place]) 05/13/2015  . Screening for HIV (human immunodeficiency virus) 07/08/2014  . Essential hypertension, benign 11/19/2013  . Anxiety state, unspecified 11/19/2013  . Screen for STD (sexually transmitted disease) 11/01/2013  . Encounter for routine gynecological examination 11/01/2013  . Oral contraceptive use 10/20/2011  . Multiple sclerosis  ([place]) 08/08/2007  . RASH AND OTHER NONSPECIFIC SKIN ERUPTION 06/22/2007  . AVASCULAR NECROSIS, FEMORAL HEAD 03/11/2006  . Essential hypertension 02/03/2006  . 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 DVT, HX OF 02/03/2006    Orientation RESPIRATION BLADDER Height & Weight     Self, Time, Situation, Place  Normal Continent Weight: 211 lb (95.7 kg) Height:  5\' 6"  (167.6 cm)  BEHAVIORAL SYMPTOMS/MOOD NEUROLOGICAL BOWEL NUTRITION STATUS      Continent Diet(See dc summary)  AMBULATORY STATUS COMMUNICATION OF NEEDS Skin   Extensive Assist Verbally Normal                       Personal Care Assistance Level of Assistance  Bathing, Feeding, Dressing Bathing Assistance: Limited assistance Feeding assistance: Independent Dressing Assistance: Limited assistance     Functional Limitations Info  Sight, Hearing, Speech Sight Info: Adequate Hearing Info: Adequate Speech Info: Adequate    SPECIAL CARE FACTORS FREQUENCY  PT (By licensed PT), OT (By licensed OT)     PT Frequency: 5x/week OT Frequency: 5x/week            Contractures Contractures Info: Not present    Additional Factors Info  Code Status, Allergies Code Status Info: Full Allergies Info: Ace Inhibitors, Amoxicillin, Lisinopril           Current Medications (04/04/2018):  This is the current hospital active medication list Current Facility-Administered Medications  Medication Dose Route Frequency Provider Last Rate Last Dose  . acetaminophen (TYLENOL) tablet 650 mg  650 mg Oral Q6H PRN Rise Patience, MD       Or  . acetaminophen (TYLENOL)  suppository 650 mg  650 mg Rectal Q6H PRN Rise Patience, MD      . amantadine (SYMMETREL) capsule 100 mg  100 mg Oral BID Rise Patience, MD   100 mg at 04/04/18 0945  . amitriptyline (ELAVIL) tablet 50 mg  50 mg Oral QHS Rise Patience, MD   50 mg at 04/03/18 2231  . amLODipine (NORVASC) tablet 5 mg  5 mg Oral QPC breakfast Rise Patience, MD   5 mg  at 04/04/18 0945  . amphetamine-dextroamphetamine (ADDERALL XR) 24 hr capsule 20 mg  20 mg Oral Daily Rise Patience, MD   20 mg at 04/04/18 0943  . apixaban (ELIQUIS) tablet 5 mg  5 mg Oral BID Rise Patience, MD   5 mg at 04/04/18 0943  . baclofen (LIORESAL) tablet 20 mg  20 mg Oral QID Rise Patience, MD   20 mg at 04/04/18 1402  . busPIRone (BUSPAR) tablet 15 mg  15 mg Oral BID Rise Patience, MD   15 mg at 04/04/18 0944  . citalopram (CELEXA) tablet 20 mg  20 mg Oral q morning - 10a Rise Patience, MD   20 mg at 04/04/18 0945  . dalfampridine TB12 10 mg  10 mg Oral Q12H Rise Patience, MD   10 mg at 04/04/18 0948  . diphenhydrAMINE (BENADRYL) capsule 25 mg  25 mg Oral Q8H PRN Gardiner Barefoot, NP   25 mg at 04/03/18 2319  . gabapentin (NEURONTIN) capsule 800 mg  800 mg Oral QID Rise Patience, MD   800 mg at 04/04/18 1401  . lip balm (CARMEX) ointment   Topical PRN Roney Jaffe, MD      . methylPREDNISolone sodium succinate (SOLU-MEDROL) 1,000 mg in sodium chloride 0.9 % 50 mL IVPB  1,000 mg Intravenous Daily Rise Patience, MD 58 mL/hr at 04/04/18 0648 1,000 mg at 04/04/18 0648  . metoprolol tartrate (LOPRESSOR) tablet 12.5 mg  12.5 mg Oral BID Rise Patience, MD   12.5 mg at 04/04/18 0944  . multivitamin with minerals tablet 1 tablet  1 tablet Oral Daily Rise Patience, MD   1 tablet at 04/04/18 [phone]  . ondansetron (ZOFRAN) tablet 4 mg  4 mg Oral Q6H PRN Rise Patience, MD   4 mg at 04/03/18 5427   Or  . ondansetron (ZOFRAN) injection 4 mg  4 mg Intravenous Q6H PRN Rise Patience, MD      . Oxcarbazepine (TRILEPTAL) tablet 300 mg  300 mg Oral BID Rise Patience, MD   300 mg at 04/04/18 0945  . oxyCODONE-acetaminophen (PERCOCET/ROXICET) 5-325 MG per tablet 1 tablet  1 tablet Oral Q4H Rise Patience, MD   1 tablet at 04/04/18 1214   And  . oxyCODONE (Oxy IR/ROXICODONE) immediate release tablet 5 mg  5  mg Oral Q4H Rise Patience, MD   5 mg at 04/04/18 1214  . pantoprazole (PROTONIX) EC tablet 40 mg  40 mg Oral Daily Rise Patience, MD   40 mg at 04/04/18 0944  . tamsulosin (FLOMAX) capsule 0.4 mg  0.4 mg Oral QPC breakfast Rise Patience, MD   0.4 mg at 04/04/18 0944  . tiZANidine (ZANAFLEX) tablet 4 mg  4 mg Oral QHS Rise Patience, MD   4 mg at 04/03/18 2230  . valACYclovir (VALTREX) tablet 500 mg  500 mg Oral BID Rise Patience, MD   500 mg at 04/04/18 0945  Discharge Medications: Please see discharge summary for a list of discharge medications.  Relevant Imaging Results:  Relevant Lab Results:   Additional Information ssn: [id number]  Servando Snare, LCSW

## 2018-04-04 NOTE — Progress Notes (Signed)
LCSW following for SNF placement.   LCSW faxed patient out to facilities. Patient will need pre auth. prior to dc.  [name]

## 2018-04-04 NOTE — Clinical Social Work Note (Signed)
Clinical Social Work Assessment  Patient Details  Name: Dawn Peters MRN: [id number] Date of Birth: 1980-11-28  Date of referral:  04/04/18               Reason for consult:  Housing Concerns/Homelessness                Permission sought to share information with:  Family Supports Permission granted to share information::  Yes, Verbal Permission Granted  Name::     Nature conservation officer::     Relationship::  Mother  Contact Information:     Housing/Transportation Living arrangements for the past 2 months:  No permanent address Source of Information:  Patient, Parent Patient Interpreter Needed:  None Criminal Activity/Legal Involvement Pertinent to Current Situation/Hospitalization:  No - Comment as needed Significant Relationships:  Parents, Significant Other Lives with:  Significant Other Do you feel safe going back to the place where you live?  Yes Need for family participation in patient care:  Yes (Comment)  Care giving concerns: Dawn Peters is a 38 y.o. female with history of multiple sclerosis was recently discharged home on March 15, 2018 since I last visited her neurologist Dr. Felecia Shelling and her Dawn Peters was discontinued in view of starting Ocrevus presents to the ER because of weakness of the left side more on the left lower extremity last 3 days.  Patient also has been having some falls but denies hitting head or losing consciousness.  Over the last 3 days her left lower extremity is progressively became weak and unable to move at this time.  Last night she had another fall prompting her to come to the ER.  Denies any fever chills chest pain productive cough.  Patients mother, Dawn Peters reports that patient is currently homeless. Mother expressed concerns about patient ability to receive therapy if needed without having stable housing.   Social Worker assessment / plan:  LCSW consulted for homeless issues.   LCSW met with patient at bedside. Patient SO present.  Patient reports that she has been living between a hotel and her cousins house due to unsafe living conditions and plumbing issues at her previous home. Patient reports that she is not homeless and actively looking for housing. Patient states that she has been searching on [employer] and her adult son has been assisting.   Patient request housing resources. Patient gave LCSW permission to speak with her mother.  LCSW spoke with patients mother, Dawn Peters by phone. Dawn Peters expressed concerns about patients living situation and concerns about patient being forthcoming with her current situation.   Patient previously went to SNF for rehab. No PT eval at the time of assessment.   PLAN: LCSW provided housing resources. LCSW will continue to follow for dc needs.    Employment status:  Disabled (Comment on whether or not currently receiving Disability) Insurance information:  Managed Medicare PT Recommendations:  Not assessed at this time Information / Referral to community resources:     Patient/Family's Response to care:  Patient and mother are thankful for LCSW follow up and resources.   Patient/Family's Understanding of and Emotional Response to Diagnosis, Current Treatment, and Prognosis:  Patient states that she is currently working on her housing situation. Patient states she receives a SSI check of $1000+ monthly. Patient states that she is hopeful she will find housing. Patient reports that her family is assisting her with locating affordable housing.   Emotional Assessment Appearance:  Appears stated age Attitude/Demeanor/Rapport:  Engaged Affect (typically observed):  Calm,  Pleasant Orientation:  Oriented to Self, Oriented to Place, Oriented to  Time, Oriented to Situation Alcohol / Substance use:  Not Applicable Psych involvement (Current and /or in the community):  No (Comment)  Discharge Needs  Concerns to be addressed:  Homelessness Readmission within the last 30 days:   Yes Current discharge risk:  Homeless Barriers to Discharge:  Continued Medical Work up   [employer], LCSW 04/04/2018, 9:38 AM

## 2018-04-04 NOTE — Progress Notes (Addendum)
[facility] Progress Note  Subjective: moving L leg somewhat better tdoay.  D#2 of steroids 1 gm qd x 3.   Vitals:   04/03/18 2031 04/04/18 0448 04/04/18 0942 04/04/18 1340  BP: (!) 126/91 126/89 (!) 137/95 116/80  Pulse: (!) 112 86 85 100  Resp: 20 20  18   Temp: 98.3 F (36.8 C) 97.7 F (36.5 C)  98.1 F (36.7 C)  TempSrc: Oral Oral    SpO2: 96% 100%  98%  Weight:      Height:        Inpatient medications: . amantadine  100 mg Oral BID  . amitriptyline  50 mg Oral QHS  . amLODipine  5 mg Oral QPC breakfast  . amphetamine-dextroamphetamine  20 mg Oral Daily  . apixaban  5 mg Oral BID  . baclofen  20 mg Oral QID  . busPIRone  15 mg Oral BID  . citalopram  20 mg Oral q morning - 10a  . dalfampridine  10 mg Oral Q12H  . gabapentin  800 mg Oral QID  . metoprolol tartrate  12.5 mg Oral BID  . multivitamin with minerals  1 tablet Oral Daily  . Oxcarbazepine  300 mg Oral BID  . oxyCODONE-acetaminophen  1 tablet Oral Q4H   And  . oxyCODONE  5 mg Oral Q4H  . pantoprazole  40 mg Oral Daily  . tamsulosin  0.4 mg Oral QPC breakfast  . tiZANidine  4 mg Oral QHS  . valACYclovir  500 mg Oral BID   . methylPREDNISolone (SOLU-MEDROL) injection 1,000 mg (04/04/18 1937)   acetaminophen **OR** acetaminophen, diphenhydrAMINE, lip balm, ondansetron **OR** ondansetron (ZOFRAN) IV  Exam: Gen: alert, no distress, sitting up in chair Eyes: Anicteric no pallor. ENMT: No discharge from the ears eyes nose or mouth. Neck: No mass felt.  No neck rigidity. Respiratory: No rhonchi or crepitations. Cardiovascular: S1-S2 heard. Abdomen: Soft nontender bowel sounds present. Musculoskeletal: No edema.  No joint effusion. Skin: Small skin tear on the right knee. Neurologic: Alert awake oriented to time place and person.  Left lower extremity is 2 x 5 and strength in left upper extremity is 3 x 5 in strength.  Right upper and lower extremities 5 x 5 in strength. Psychiatric: Appears normal  with a normal affect.    Presentation Summary: Trinidee Schrag is a 38 y.o. female with history of multiple sclerosis was recently discharged home on March 15, 2018 since I last visited her neurologist Dr. Felecia Shelling and her Melchor Amour was discontinued in view of starting Ocrevus presents to the ER because of weakness of the left side more on the left lower extremity last 3 days.  Patient also has been having some falls but denies hitting head or losing consciousness.  Over the last 3 days her left lower extremity is progressively became weak and unable to move at this time.  Last night she had another fall prompting her to come to the ER.  Denies any fever chills chest pain productive cough. ED Course: In the ER patient was afebrile.  UA was unremarkable.  ER physician discussed with Dr. Lorraine Lax on-call neurologist who at this time recommended methylprednisolone 1 g x 3 days.  On exam patient does have a small skin tear on the right knee from the fall.  No obvious effusion.  Able to move her right side.  Left lower extremity is weak almost 0 x 5 in strength in left upper extremity is 3 x 5 in strength.  Denies  neck pain.       Hospital Problems/ Course:  1. Multiple sclerosis exacerbation - per neurology plan is for 1 g methylprednisolone daily for 3 days.  Last day tomorrow. SNF placement .  2. Hypertension on metoprolol and Norvasc. 3. 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 4. Chronic kidney disease stage III creatinine appears to be at baseline. 5. History of left-sided hydronephrosis requiring stenting. Will get renal US, last one was in 2012. UA is negative.  6. Chronic pain on with Percocet. 7. Anxiety we will continue present home medications. 8. Herpes prophylaxis on valacyclovir.    DVT prophylaxis: Apixaban. Code Status: Full code. Family Communication: Discussed with patient. Disposition Plan: To be determined. Consulting: Neurology Admission status: Inpatient.   Kelly Splinter  MD [facility] pgr [phone] 04/04/2018, 4:41 PM   Recent Labs  Lab 04/03/18 0035 04/03/18 0434 04/04/18 0645  NA 138 138 137  K 4.1 4.3 4.1  CL 107 107 104  CO2 25 22 27   GLUCOSE 137* 132* 135*  BUN 15 13 16   CREATININE 1.47* 1.36* 1.37*  CALCIUM 9.2 8.9 9.1   Recent Labs  Lab 04/03/18 0035  AST 15  ALT 13  ALKPHOS 68  BILITOT 0.4  PROT 7.5  ALBUMIN 3.9   Recent Labs  Lab 04/03/18 0035 04/03/18 0434 04/04/18 0645  WBC 8.7 8.6 16.2*  NEUTROABS 8.1*  --   --   HGB 12.4 13.1 12.1  HCT 37.9 40.0 37.9  MCV 103.0* 103.4* 102.7*  PLT 363 298 371   Iron/TIBC/Ferritin/ %Sat No results found for: IRON, TIBC, FERRITIN, IRONPCTSAT

## 2018-04-04 NOTE — Progress Notes (Signed)
Subjective: No significant changes  Exam: Vitals:   04/04/18 0448 04/04/18 0942  BP: 126/89 (!) 137/95  Pulse: 86 85  Resp: 20   Temp: 97.7 F (36.5 C)   SpO2: 100%    Gen: In bed, NAD Resp: non-labored breathing, no acute distress Abd: soft, nt  Neuro: MS: Awake, alert, interactive and appropriate CN: Pupils reactive bilaterally, EOMI Motor: She will not lift her left leg at the hip when asked, however if she is asked to bend her left knee, she flexes the hip with good strength   Impression: 38 year old female with multiple sclerosis who presents with exacerbation versus pseudo-exacerbation.  She has received 2/3 days of IV Solu-Medrol for this admission, and though I suspect there may be some psychogenic overlay I think that the risk of giving her third dose is relatively low and the patient is adamant that she receives benefit from this. I think that with future exacerbations, some consideration prior to starting steroids(e.g. checking for MRI correlate) may be beneficial.   Recommendations: 1) finish three days IV solumedrol 2) PT/OT 3) No further recommendations at this time. Would not pursue further acute treatment beyond 3 days IV solumedrol. Please call with further questions or concerns.   Roland Rack, MD [facility] [phone]  If 7pm- 7am, please page neurology on call as listed in [place].

## 2018-04-04 NOTE — Progress Notes (Signed)
Physical Therapy Evaluation Patient Details Name: Dawn Peters MRN: [id number] DOB: 25-Sep-1980 Today's Date: 04/04/2018   History of Present Illness  Patient is a 38 y/o female presenting to the ED on 04/03/18 with primary complaints of L sided weakness. PMH significant of Anxiety, chronic pain, CKD, DVT/PE, MS, HTN, neurogenic bladder.     Clinical Impression  Patient admitted with the above listed diagnosis. Patient reports a recent history of progressive L LE weakness and falls. Patient today requiring physical assist for all bed level mobility, transfers, and limited gait with RW (up to max A). Patient demonstrating decreased strength of L LE with limited weight acceptance and flexed posture throughout mobility. Cueing provided to increase trunk and L LE extension with little carryover. Patient at high risk for falls currently. Based on current functional status will recommend SNF for short term rehab prior to return home.   Patient reports she has a rollator, however if patient does return home will like need RW due to need for increased UE use for stability that rollator can not provide.     Follow Up Recommendations SNF    Equipment Recommendations  Rolling walker with 5" wheels    Recommendations for Other Services       Precautions / Restrictions Precautions Precautions: Fall Precaution Comments: L LE weakness, MS; knees buckle Restrictions Weight Bearing Restrictions: No      Mobility  Bed Mobility Overal bed mobility: Needs Assistance Bed Mobility: Supine to Sit     Supine to sit: Min assist     General bed mobility comments: heavy use of UE on bedrails and mattress - poor trunk control throughout  Transfers Overall transfer level: Needs assistance Equipment used: Rolling walker (2 wheeled) Transfers: Sit to/from Stand Sit to Stand: Max assist;Mod assist Stand pivot transfers: Max assist;Mod assist       General transfer comment: Mod/Max A to stand  and pivot with poor control of L LE; noted trunk flexion throughout with patient stating she can not stand full erect; poor safety awareness  Ambulation/Gait Ambulation/Gait assistance: Mod assist Gait Distance (Feet): 15 Feet Assistive device: Rolling walker (2 wheeled) Gait Pattern/deviations: Step-to pattern;Decreased step length - right;Decreased weight shift to left;Trunk flexed Gait velocity: decreased    General Gait Details: poor safety awareness with trunk flexion; cueing for safety throughout; tends to scoot L LE against floor with flexed knee with gait;   Stairs            Wheelchair Mobility    Modified Rankin (Stroke Patients Only)       Balance Overall balance assessment: Needs assistance Sitting-balance support: No upper extremity supported;Feet supported Sitting balance-Leahy Scale: Fair     Standing balance support: Bilateral upper extremity supported;During functional activity Standing balance-Leahy Scale: Poor Standing balance comment: heavy reliance on RW and support from PT                             Pertinent Vitals/Pain Pain Assessment: No/denies pain    Home Living Family/patient expects to be discharged to:: Private residence Living Arrangements: Spouse/significant other;Other relatives Available Help at Discharge: Family Type of Home: House Home Access: Level entry     Home Layout: One level Home Equipment: [place] - 4 wheels;Tub bench;Bedside commode(reports handles at rollator are broken) Additional Comments: lives with husband and cousin - reports she needs to find somewhere else to live    Prior Function Level of Independence: Independent with assistive  device(s)         Comments: use of rollator     Hand Dominance   Dominant Hand: Right    Extremity/Trunk Assessment   Upper Extremity Assessment Upper Extremity Assessment: Defer to OT evaluation    Lower Extremity Assessment Lower Extremity Assessment:  Generalized weakness;LLE deficits/detail LLE Deficits / Details: grossly 2/5 to 3-/5 at L LE - difficulty with picking LE up against gravity    Cervical / Trunk Assessment Cervical / Trunk Assessment: Normal  Communication   Communication: No difficulties  Cognition Arousal/Alertness: Awake/alert Behavior During Therapy: WFL for tasks assessed/performed Overall Cognitive Status: Within Functional Limits for tasks assessed                                        General Comments      Exercises     Assessment/Plan    PT Assessment Patient needs continued PT services  PT Problem List Decreased strength;Decreased activity tolerance;Decreased balance;Decreased mobility;Decreased knowledge of use of DME;Decreased safety awareness       PT Treatment Interventions DME instruction;Gait training;Stair training;Functional mobility training;Therapeutic activities;Therapeutic exercise;Patient/family education;Balance training    PT Goals (Current goals can be found in the Care Plan section)  Acute Rehab PT Goals Patient Stated Goal: improve mobility PT Goal Formulation: With patient Time For Goal Achievement: 04/18/18 Potential to Achieve Goals: Good    Frequency Min 3X/week   Barriers to discharge        Co-evaluation               AM-PAC PT "6 Clicks" Mobility  Outcome Measure Help needed turning from your back to your side while in a flat bed without using bedrails?: A Lot Help needed moving from lying on your back to sitting on the side of a flat bed without using bedrails?: A Lot Help needed moving to and from a bed to a chair (including a wheelchair)?: A Lot Help needed standing up from a chair using your arms (e.g., wheelchair or bedside chair)?: A Lot Help needed to walk in hospital room?: A Lot Help needed climbing 3-5 steps with a railing? : Total 6 Click Score: 11    End of Session Equipment Utilized During Treatment: Gait belt Activity  Tolerance: Patient tolerated treatment well Patient left: in chair;with call bell/phone within reach;with chair alarm set;with family/visitor present Nurse Communication: Mobility status PT Visit Diagnosis: Unsteadiness on feet (R26.81);Other abnormalities of gait and mobility (R26.89);Muscle weakness (generalized) (M62.81);History of falling (Z91.81)    Time: 1093-2355 PT Time Calculation (min) (ACUTE ONLY): 29 min   Charges:   PT Evaluation $PT Eval Moderate Complexity: 1 Mod PT Treatments $Gait Training: 8-22 mins        Lanney Gins, PT, DPT Supplemental Physical Therapist 04/04/18 12:26 PM Pager: [phone] Office: [phone]

## 2018-04-05 NOTE — Evaluation (Signed)
Occupational Therapy Evaluation Patient Details Name: Dawn Peters MRN: [id number] DOB: 1980-05-12 Today's Date: 04/05/2018    History of Present Illness Patient is a 38 y/o female presenting to the ED on 04/03/18 with primary complaints of L sided weakness. PMH significant of Anxiety, chronic pain, CKD, DVT/PE, MS, HTN, neurogenic bladder.    Clinical Impression   This 38 y/o female presents with the above. Pt with recent hospital admission; fluctuating reports of level of assist prior to this hospital admission though suspect pt was requiring some assist for ADL completion. Pt presenting with decreased fine motor/coordination in UEs, generalized weakness and decreased dynamic balance; pt also with decreased insight into current deficits. She currently requires two person assist for safe completion of functional mobility using RW with increased assist required when fatigued. Pt currently requires minA for seated UB ADL; mod-maxA for LB ADL. Pt will benefit from continued acute OT services and recommend follow up therapy services in SNF setting after discharge to maximize her safety and independence with ADL and mobility. Will follow.     Follow Up Recommendations  SNF;Supervision/Assistance - 24 hour    Equipment Recommendations  Other (comment)(TBD in next venue)           Precautions / Restrictions Precautions Precautions: Fall Precaution Comments: L LE weakness, MS; knees buckle Restrictions Weight Bearing Restrictions: No      Mobility Bed Mobility Overal bed mobility: Needs Assistance Bed Mobility: Supine to Sit     Supine to sit: Min assist;Min guard     General bed mobility comments: use of UE to guide L LE to EOB; increased time and effort; poor trunk control  Transfers Overall transfer level: Needs assistance Equipment used: Rolling walker (2 wheeled) Transfers: Sit to/from [facility] Sit to Stand: Mod assist;+2 physical assistance Stand  pivot transfers: Mod assist;Max assist;+2 physical assistance       General transfer comment: Mod A +2 to power up from bedside with cueing for hand placement; poor upright posturing with cueing for extension; stand pivot at end of session following gait training with patient demonstrating poor safety awareness with patient sitting while perpindicular to chair increasing her fall risk - Max A +2 to prevent fall/aide to chair    Balance Overall balance assessment: Needs assistance Sitting-balance support: No upper extremity supported;Feet supported Sitting balance-Leahy Scale: Fair Sitting balance - Comments: poor trunk control without back support   Standing balance support: Bilateral upper extremity supported;During functional activity Standing balance-Leahy Scale: Poor Standing balance comment: heavy reliance on RW and support from PT                           ADL either performed or assessed with clinical judgement   ADL Overall ADL's : Needs assistance/impaired Eating/Feeding: Modified independent;Sitting   Grooming: Set up;Sitting   Upper Body Bathing: Minimal assistance;Sitting   Lower Body Bathing: Moderate assistance;Sit to/from stand   Upper Body Dressing : Minimal assistance;Sitting   Lower Body Dressing: Moderate assistance;Maximal assistance;Sit to/from stand Lower Body Dressing Details (indicate cue type and reason): pt able to don socks at bed level via use of long sitting; varying levels of assist for standing balance pending fatigue Toilet Transfer: Moderate assistance;+2 for physical assistance;+2 for safety/equipment;RW;Ambulation Toilet Transfer Details (indicate cue type and reason): fluctuating levels of assist due to fatigue Toileting- Clothing Manipulation and Hygiene: Maximal assistance;+2 for physical assistance;+2 for safety/equipment;Sit to/from stand       Functional mobility  during ADLs: Moderate assistance;+2 for physical assistance;+2  for safety/equipment;Rolling walker       Vision         Perception     Praxis      Pertinent Vitals/Pain Pain Assessment: No/denies pain     Hand Dominance Right   Extremity/Trunk Assessment Upper Extremity Assessment Upper Extremity Assessment: RUE deficits/detail;LUE deficits/detail;Generalized weakness RUE Deficits / Details: decreased fine motor noted RUE Coordination: decreased fine motor LUE Deficits / Details: decreased fine motor/coordination noted LUE Coordination: decreased fine motor   Lower Extremity Assessment Lower Extremity Assessment: Defer to PT evaluation   Cervical / Trunk Assessment Cervical / Trunk Assessment: Normal   Communication Communication Communication: No difficulties   Cognition Arousal/Alertness: Awake/alert Behavior During Therapy: WFL for tasks assessed/performed Overall Cognitive Status: Impaired/Different from baseline Area of Impairment: Safety/judgement;Awareness                         Safety/Judgement: Decreased awareness of deficits Awareness: Emergent   General Comments: decreased awareness of deficits   General Comments       Exercises     Shoulder Instructions      Home Living Family/patient expects to be discharged to:: Private residence Living Arrangements: Spouse/significant other;Other relatives Available Help at Discharge: Family Type of Home: House Home Access: Level entry Entrance Stairs-Number of Steps: 2 Entrance Stairs-Rails: Left;Right Home Layout: One level     Bathroom Shower/Tub: Teacher, early years/pre: Standard     Home Equipment: Environmental consultant - 4 wheels;Tub bench;Bedside commode;Shower seat(reports rollator is broken; tub bench vs shower seat?)   Additional Comments: per PT eval pt lives with husband and cousin and reports she needs to find somewhere else to live      Prior Functioning/Environment Level of Independence: Independent with assistive device(s);Needs  assistance    ADL's / Homemaking Assistance Needed: suspect needed some assist for ADL, pt not very clear with responses   Comments: use of rollator        OT Problem List: Decreased strength;Decreased range of motion;Decreased activity tolerance;Impaired balance (sitting and/or standing);Decreased safety awareness;Obesity;Impaired UE functional use      OT Treatment/Interventions: Self-care/ADL training;Therapeutic exercise;Neuromuscular education;DME and/or AE instruction;Therapeutic activities;Patient/family education;Balance training    OT Goals(Current goals can be found in the care plan section) Acute Rehab OT Goals Patient Stated Goal: improve mobility OT Goal Formulation: With patient Time For Goal Achievement: 04/19/18 Potential to Achieve Goals: Good  OT Frequency: Min 2X/week   Barriers to D/C:            Co-evaluation PT/OT/SLP Co-Evaluation/Treatment: Yes Reason for Co-Treatment: Complexity of the patient's impairments (multi-system involvement);For patient/therapist safety;To address functional/ADL transfers PT goals addressed during session: Mobility/safety with mobility;Balance;Proper use of DME OT goals addressed during session: ADL's and self-care;Proper use of Adaptive equipment and DME      AM-PAC OT "6 Clicks" Daily Activity     Outcome Measure Help from another person eating meals?: None Help from another person taking care of personal grooming?: A Little Help from another person toileting, which includes using toliet, bedpan, or urinal?: A Lot Help from another person bathing (including washing, rinsing, drying)?: A Lot Help from another person to put on and taking off regular upper body clothing?: A Little Help from another person to put on and taking off regular lower body clothing?: A Lot 6 Click Score: 16   End of Session Equipment Utilized During Treatment: Gait belt;Rolling walker Nurse Communication: Mobility status  Activity Tolerance:  Patient tolerated treatment well Patient left: in chair;with call bell/phone within reach;with family/visitor present  OT Visit Diagnosis: Muscle weakness (generalized) (M62.81);Other symptoms and signs involving the nervous system (R29.898)                Time: 3570-1779 OT Time Calculation (min): 23 min Charges:  OT General Charges $OT Visit: 1 Visit OT Evaluation $OT Eval Moderate Complexity: [place], OT [employer] Pager [phone] Office [phone]   Raymondo Band 04/05/2018, 11:34 AM

## 2018-04-05 NOTE — Progress Notes (Signed)
At bedside.  Pt up walking in halls with PT.  David RN notified to place order once pt back in the bed.

## 2018-04-05 NOTE — Discharge Summary (Signed)
Physician Discharge Summary  Patient ID: Esthefany Herrig MRN: [id number] DOB/AGE: [birth year] 38 y.o.  Admit date: 04/02/2018 Discharge date: 04/05/2018  Admission Diagnoses: Principal Problem:   Multiple sclerosis exacerbation ([place]) Active Problems:   Essential hypertension   DVT, HX OF   Pulmonary embolism (H[facility])   CKD (chronic kidney disease), stage III ([place])  Discharge Diagnoses:  Principal Problem:   Multiple sclerosis exacerbation ([place]) Active Problems:   Essential hypertension   DVT, HX OF   Pulmonary embolism (HCC)   CKD (chronic kidney disease), stage III ([place])   Discharged Condition: good  Presentation Summary: Alithia Marie Dillardis a 38 y.o.femalewithhistory of multiple sclerosis was recently discharged home on March 15, 2018 since I last visited her neurologist Dr. Felecia Shelling and her Melchor Amour was discontinued in view of starting Ocrevus presents to the ER because of weakness of the left side more on the left lower extremity last 3 days. Patient also has been having some falls but denies hitting head or losing consciousness. Over the last 3 days her left lower extremity is progressively became weak and unable to move at this time. Last night she had another fall prompting her to come to the ER. Denies any fever chills chest pain productive cough. ED Course:In the ER patient was afebrile. UA was unremarkable. ER physician discussed with Dr. Lorraine Lax on-call neurologist who at this time recommended methylprednisolone 1 g x 3 days. On exam patient does have a small skin tear on the right knee from the fall. No obvious effusion. Able to move her right side. Left lower extremity is weak almost 0 x 5 in strength in left upper extremity is 3 x 5 in strength. Denies neck pain.   Hospital Course:   Multiple sclerosis exacerbation: per neurology treat MS exac w/ IV solumedrol 1gm IV daily for 3 days then stop Rx. No further steroids rec'd.   Debility: for SNF  placement  Hypertension: on metoprolol and Norvasc.  History of PE and DVT: on apixaban.  Chronic kidney disease stage QHU:[id number] appears to be at baseline.  History of left-sided hydronephrosis: requiring stenting. Will get renal US, last one was in 2012. UA is negative.   Chronic pain: on with Percocet.  Anxiety: we will continue present home medications.  Herpes prophylaxis: on valacyclovir.   Kelly Splinter MD [facility] pgr [phone] 04/05/2018, 1:53 PM  Consults: Neurology  Significant Diagnostic Studies: none done here  Treatments:  IV solumedrol 1gm daily 1/28- 04/05/18  Discharge Exam: Blood pressure (!) 131/94, pulse 95, temperature (!) 97.5 F (36.4 C), temperature source Oral, resp. rate (!) 21, height 5\' 6"  (1.676 m), weight 103.2 kg, last menstrual period 03/13/2018, SpO2 100 %. Gen: alert, no distress, sitting up in chair Eyes:Anicteric no pallor. ENMT:No discharge from the ears eyes nose or mouth. Neck:No mass felt. No neck rigidity. Respiratory:No rhonchi or crepitations. Cardiovascular:S1-S2 heard. Abdomen:Soft nontender bowel sounds present. Musculoskeletal:No edema. No joint effusion. Skin:Small skin tear on the right knee. Neurologic:Alert awake oriented to time place and person. Left lower extremity is 2 x 5 and strength in left upper extremity is 3 x 5 in strength. Right upper and lower extremities 5 x 5 in strength. Psychiatric:Appears normal with a normal affect.  Disposition: Discharge disposition: 03-Skilled Nursing Facility        Allergies as of 04/05/2018      Reactions   Ace Inhibitors Swelling   Amoxicillin Itching   Did it involve swelling of the face/tongue/throat, SOB, or low BP?  Yes Did it involve sudden or severe rash/hives, skin peeling, or any reaction on the inside of your mouth or nose? No Did you need to seek medical attention at a hospital or doctor's office? No When did it last  happen?unknown  If all above answers are "NO", may proceed with cephalosporin use.;   Lisinopril Swelling   Lower lip swelling      Medication List    TAKE these medications   amantadine 100 MG capsule Commonly known as:  SYMMETREL Take 100 mg by mouth 2 (two) times daily.   amitriptyline 25 MG tablet Commonly known as:  ELAVIL TAKE 1-2 TABLETS BY MOUTH AT BEDTIME What changed:  how much to take   amLODipine 5 MG tablet Commonly known as:  NORVASC Take 1 tablet (5 mg total) by mouth daily. What changed:  when to take this   amphetamine-dextroamphetamine 20 MG 24 hr capsule Commonly known as:  ADDERALL XR Take 1 capsule (20 mg total) by mouth daily.   apixaban 5 MG Tabs tablet Commonly known as:  ELIQUIS Take 1 tablet (5 mg total) by mouth 2 (two) times daily.   baclofen 20 MG tablet Commonly known as:  LIORESAL Take 1 tablet (20 mg total) by mouth 3 (three) times daily as needed (for ms). What changed:  when to take this   busPIRone 15 MG tablet Commonly known as:  BUSPAR Take 1 tablet (15 mg total) by mouth 2 (two) times daily.   citalopram 20 MG tablet Commonly known as:  CELEXA Take 20 mg by mouth every morning.   dalfampridine 10 MG Tb12 Take 1 tablet (10 mg total) by mouth 2 (two) times daily. What changed:  when to take this   gabapentin 800 MG tablet Commonly known as:  NEURONTIN Take 1 tablet (800 mg total) by mouth 3 (three) times daily. What changed:  when to take this   lamoTRIgine 200 MG tablet Commonly known as:  LAMICTAL Take 1 tablet (200 mg total) by mouth 2 (two) times daily.   metoprolol tartrate 25 MG tablet Commonly known as:  LOPRESSOR Take 0.5 tablets (12.5 mg total) by mouth 2 (two) times daily.   multivitamin with minerals Tabs tablet Take 1 tablet by mouth daily.   Oxcarbazepine 300 MG tablet Commonly known as:  TRILEPTAL Take 1 tablet (300 mg total) by mouth 2 (two) times daily.   oxyCODONE-acetaminophen 10-325 MG  tablet Commonly known as:  PERCOCET Take 1 tablet by mouth every 4 (four) hours.   polyethylene glycol packet Commonly known as:  MIRALAX / GLYCOLAX Take 17 g by mouth daily.   senna-docusate 8.6-50 MG tablet Commonly known as:  Senokot-S Take 1 tablet by mouth at bedtime.   SUMAtriptan 100 MG tablet Commonly known as:  IMITREX Take 1 tablet (100 mg total) by mouth once as needed for migraine. May repeat in 2 hours if headache persists or recurs.   tamsulosin 0.4 MG Caps capsule Commonly known as:  FLOMAX Take 1 capsule (0.4 mg total) by mouth daily. What changed:  when to take this   tiZANidine 4 MG tablet Commonly known as:  ZANAFLEX TAKE 1 TABLET BY MOUTH AT BEDTIME   valACYclovir 500 MG tablet Commonly known as:  VALTREX Take 1 tablet (500 mg total) by mouth 2 (two) times daily.            Durable Medical Equipment  (From admission, onward)         Start     Ordered  04/03/18 1548  For home use only DME Other see comment  Once    Comments:  Wants a power chair   04/03/18 1547   04/03/18 1546  For home use only DME Other see comment  Once    Comments:  Patient would like a quad cane   04/03/18 1546   04/03/18 1545  For home use only DME Walker rolling  Once    Question:  Patient needs a walker to treat with the following condition  Answer:  MS (multiple sclerosis) ([place])   04/03/18 1545           Signed: Sandy Salaam [place] 04/05/2018, 1:53 PM

## 2018-04-05 NOTE — Progress Notes (Signed)
Physical Therapy Treatment Patient Details Name: Dawn Peters MRN: [id number] DOB: Feb 01, 1981 Today's Date: 04/05/2018    History of Present Illness Patient is a 38 y/o female presenting to the ED on 04/03/18 with primary complaints of L sided weakness. PMH significant of Anxiety, chronic pain, CKD, DVT/PE, MS, HTN, neurogenic bladder.     PT Comments    Patient seen for mobility progression. Co-treat with OT to progress mobility for patient/therapist safety. Requires Mod A +2 for safe ambulation in hallway with use of RW. Patient demonstrating poor upright trunk with tendency to drag L LE as well as poor use of B UE to support body weight at RW. Poor safety awareness/awareness of deficits throughout with patient quickly fatiguing and needing to sit quickly due to L LE buckle. Will continue to recommend SNF at discharge to progress safe functional mobility prior to return home.     Follow Up Recommendations  SNF     Equipment Recommendations  Rolling walker with 5" wheels    Recommendations for Other Services       Precautions / Restrictions Precautions Precautions: Fall Precaution Comments: L LE weakness, MS; knees buckle Restrictions Weight Bearing Restrictions: No    Mobility  Bed Mobility Overal bed mobility: Needs Assistance Bed Mobility: Supine to Sit     Supine to sit: Min assist;Min guard     General bed mobility comments: use of UE to guide L LE to EOB; increased time and effort; poor trunk control  Transfers Overall transfer level: Needs assistance Equipment used: Rolling walker (2 wheeled) Transfers: Sit to/from [facility] Sit to Stand: Mod assist;+2 physical assistance Stand pivot transfers: Mod assist;Max assist;+2 physical assistance       General transfer comment: Mod A +2 to power up from bedside with cueing for hand placement; poor upright posturing with cueing for extension; stand pivot at end of session following gait  training with patient demonstrating poor safety awareness with patient sitting while perpindicular to chair increasing her fall risk - Max A +2 to prevent fall/aide to chair  Ambulation/Gait Ambulation/Gait assistance: Mod assist;+2 physical assistance Gait Distance (Feet): 50 Feet Assistive device: Rolling walker (2 wheeled) Gait Pattern/deviations: Step-to pattern;Decreased stride length;Decreased weight shift to left;Decreased step length - left;Trunk flexed Gait velocity: decreased    General Gait Details: trunk flexion throughout; cueing for upright posture as well as use of UE to support LE at RW - tends to leave elbows in flexed position. very unsafe adn seemingly unaware of deficits/safety   Stairs             Wheelchair Mobility    Modified Rankin (Stroke Patients Only)       Balance Overall balance assessment: Needs assistance Sitting-balance support: No upper extremity supported;Feet supported Sitting balance-Leahy Scale: Fair Sitting balance - Comments: poor trunk control without back support   Standing balance support: Bilateral upper extremity supported;During functional activity Standing balance-Leahy Scale: Poor Standing balance comment: heavy reliance on RW and support from PT                            Cognition Arousal/Alertness: Awake/alert Behavior During Therapy: WFL for tasks assessed/performed Overall Cognitive Status: Within Functional Limits for tasks assessed                                 General Comments: decreased awareness of deficits  Exercises      General Comments        Pertinent Vitals/Pain Pain Assessment: No/denies pain    Home Living                      Prior Function            PT Goals (current goals can now be found in the care plan section) Acute Rehab PT Goals Patient Stated Goal: improve mobility PT Goal Formulation: With patient Time For Goal Achievement:  04/18/18 Potential to Achieve Goals: Good Progress towards PT goals: Progressing toward goals    Frequency    Min 3X/week      PT Plan Current plan remains appropriate    Co-evaluation PT/OT/SLP Co-Evaluation/Treatment: Yes Reason for Co-Treatment: Complexity of the patient's impairments (multi-system involvement);For patient/therapist safety;To address functional/ADL transfers PT goals addressed during session: Mobility/safety with mobility;Balance;Proper use of DME        AM-PAC PT "6 Clicks" Mobility   Outcome Measure  Help needed turning from your back to your side while in a flat bed without using bedrails?: A Lot Help needed moving from lying on your back to sitting on the side of a flat bed without using bedrails?: A Lot Help needed moving to and from a bed to a chair (including a wheelchair)?: A Lot Help needed standing up from a chair using your arms (e.g., wheelchair or bedside chair)?: A Lot Help needed to walk in hospital room?: A Lot Help needed climbing 3-5 steps with a railing? : Total 6 Click Score: 11    End of Session Equipment Utilized During Treatment: Gait belt Activity Tolerance: Patient tolerated treatment well Patient left: in chair;with call bell/phone within reach;with family/visitor present Nurse Communication: Mobility status PT Visit Diagnosis: Unsteadiness on feet (R26.81);Other abnormalities of gait and mobility (R26.89);Muscle weakness (generalized) (M62.81);History of falling (Z91.81)     Time: 5465-6812 PT Time Calculation (min) (ACUTE ONLY): 23 min  Charges:  $Gait Training: 8-22 mins                      Lanney Gins, PT, DPT Supplemental Physical Therapist 04/05/18 11:00 AM Pager: [phone] Office: [phone]

## 2018-04-05 NOTE — Clinical Social Work Placement (Signed)
    Patient has bed at [facility]  LCSW confirmed bed with facility.  LCSW faxed dc docs to facility.   Patient to go by [facility]  RN report # [phone]  [facility]  CLINICAL SOCIAL WORK PLACEMENT  NOTE  Date:  04/05/2018  Patient Details  Name: Dawn Peters MRN: [id number] Date of Birth: 1981-02-01  Clinical Social Work is seeking post-discharge placement for this patient at the [place] level of care (*CSW will initial, date and re-position this form in  chart as items are completed):  Yes   Patient/family provided with [facility] Work Department's list of facilities offering this level of care within the geographic area requested by the patient (or if unable, by the patient's family).  Yes   Patient/family informed of their freedom to choose among providers that offer the needed level of care, that participate in Medicare, Medicaid or managed care program needed by the patient, have an available bed and are willing to accept the patient.  Yes   Patient/family informed of [employer]'s ownership interest in [facility] and [facility], as well as of the fact that they are under no obligation to receive care at these facilities.  PASRR submitted to EDS on       PASRR number received on 04/04/18     Existing PASRR number confirmed on       FL2 transmitted to all facilities in geographic area requested by pt/family on 04/04/18     FL2 transmitted to all facilities within larger geographic area on       Patient informed that his/her managed care company has contracts with or will negotiate with certain facilities, including the following:        Yes   Patient/family informed of bed offers received.  Patient chooses bed at [facility]     Physician recommends and patient chooses bed at      Patient to be transferred to [facility] on 04/05/18.  Patient to be transferred to facility by  EMS     Patient family notified on 04/05/18 of transfer.  Name of family member notified:  patient to notify family     PHYSICIAN       Additional Comment:    _______________________________________________ Servando Snare, LCSW 04/05/2018, 2:06 PM

## 2018-04-05 NOTE — Progress Notes (Signed)
Report given to [facility] at [facility].

## 2018-04-06 ENCOUNTER — Ambulatory Visit: Payer: Medicare Other | Admitting: Physical Therapy

## 2018-04-06 ENCOUNTER — Telehealth: Payer: Self-pay | Admitting: Physical Therapy

## 2018-04-06 ENCOUNTER — Encounter

## 2018-04-06 NOTE — Telephone Encounter (Signed)
LVM regarding missed appointment today. Noted when her next scheduled appointment is and if she is unable to make it to call us and we can reschedule that appointment for her. Asked for her to review our attendance policy that was given on her initial visit.

## 2018-04-09 ENCOUNTER — Encounter: Payer: Medicare Other | Admitting: Physical Therapy

## 2018-04-10 ENCOUNTER — Encounter: Payer: Self-pay | Admitting: Physical Therapy

## 2018-04-10 ENCOUNTER — Ambulatory Visit: Payer: Medicare Other | Admitting: Physical Therapy

## 2018-04-10 DIAGNOSIS — M545 Low back pain: Secondary | ICD-10-CM | POA: Insufficient documentation

## 2018-04-10 DIAGNOSIS — G8929 Other chronic pain: Secondary | ICD-10-CM | POA: Insufficient documentation

## 2018-04-10 DIAGNOSIS — M62830 Muscle spasm of back: Secondary | ICD-10-CM | POA: Insufficient documentation

## 2018-04-10 NOTE — Patient Instructions (Signed)
TENS UNIT  This is helpful for muscle pain and spasm.   Search and Purchase a TENS 7000 2nd edition at www.tenspros.com or www.amazon.com  (It should be less than $30)     TENS unit instructions:   Do not shower or bathe with the unit on  Turn the unit off before removing electrodes or batteries  If the electrodes lose stickiness add a drop of water to the electrodes after they are disconnected from the unit and place on plastic sheet. If you continued to have difficulty, call the TENS unit company to purchase more electrodes.  Do not apply lotion on the skin area prior to use. Make sure the skin is clean and dry as this will help prolong the life of the electrodes.  After use, always check skin for unusual red areas, rash or other skin difficulties. If there are any skin problems, does not apply electrodes to the same area.  Never remove the electrodes from the unit by pulling the wires.  Do not use the TENS unit or electrodes other than as directed.  Do not change electrode placement without consulting your therapist or physician.  Keep 2 fingers with between each electrode.  Voncille Lo, PT Certified Exercise Expert for the Aging Adult  04/10/18 3:35 PM Phone: [phone] Fax: [phone]

## 2018-04-10 NOTE — Therapy (Signed)
[place], [place], [address] Phone: [phone]   Fax:  [phone]  Physical Therapy Treatment/Discharge Note/ No Charge Visit  Patient Details  Name: Dawn Peters MRN: [id number] Date of Birth: 1980/09/15 Referring Provider (PT): Rulon Abide [place]    Encounter Date: 04/10/2018  PT End of Session - 04/10/18 1537    Visit Number  3    Number of Visits  16    Date for PT Re-Evaluation  05/25/18    Authorization Type  [employer] MCR complete     PT Start Time  3267   arrived late   PT Stop Time  1522    PT Time Calculation (min)  12 min    Equipment Utilized During Treatment  Gait belt    Activity Tolerance  Patient tolerated treatment well       Past Medical History:  Diagnosis Date  . Anticoagulant long-term use    Eliquis  . Anxiety   . Chronic pain   . CKD (chronic kidney disease), stage III ([place])   . Depression   . DVT, lower extremity, recurrent ([place]) 09/17/2017   left lower extremity-- treated with eliquis  . Gait disturbance    due to MS  . GERD (gastroesophageal reflux disease)    watch diet  . 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 History of DVT of lower extremity 2007  . History of encephalopathy 02/4579   acute metabolic encephalopathy secondary to MS,  resolved  . History of MRSA infection 2004   right hip infection post THA  . History of pulmonary embolus (PE) 2005  . Hydronephrosis, left    w/ acute kidney injury 02/ 2019  . Hypertension   . Left-sided weakness    due to MS  . Migraines   . MS (multiple sclerosis) [facility]) neurologist-  dr Renne Musca   dx 2000--  . Muscle spasticity   . Neurogenic bladder    due to MS  . Pulmonary embolism, bilateral ([place]) 09/17/2017   treated w/ eliquis  . Strains to urinate   . Urgency of urination     Past Surgical History:  Procedure Laterality Date  .  CYSTOSCOPY WITH RETROGRADE PYELOGRAM, URETEROSCOPY AND STENT PLACEMENT Bilateral 11/29/2017   Procedure: BILATERAL  RETROGRADE PYELOGRAM, LEFT DIAGNOSIC URETEROSCOPY AND STENT LEFT PLACEMENT;  Surgeon: Ardis Hughs, MD;  Location: [facility];  Service: Urology;  Laterality: Bilateral;  . HIP ARTHROSCOPY Left 07-05-2013   dr pill '@[facility]'$    iliopsosas release, synovectomy  . REVISION TOTAL HIP ARTHROPLASTY Right 03-28-2003    dr Alvan Dame '@[facility]'$   . TOTAL HIP ARTHROPLASTY Bilateral left 11-05-2002  dr Alvan Dame '@[facility]'$ ;   right 06/ 2004  '@[facility]'$     There were no vitals filed for this visit.  Subjective Assessment - 04/10/18 1512    Subjective  I had another MS relapse and I was in the hospital for a second time and I just got out on Saturday.  I still have back pain.     Limitations  Standing;Walking    How long can you sit comfortably?  2 hours but I get stiff when I sit too longe    How long can you stand comfortably?  < 1 hour  I can barely stand up.    Currently in Pain?  Yes    Pain Score  8     Pain  Location  Back    Pain Orientation  Lower;Mid    Pain Descriptors / Indicators  Aching;Throbbing    Pain Onset  More than a month ago    Pain Frequency  Constant    Pain Score  8    Pain Location  Leg    Pain Orientation  Left;Right    Pain Type  Chronic pain         [facility] PT Assessment - 04/10/18 0001      Assessment   Medical Diagnosis  Low Back Pain/ Mid thoracic pain    Referring Provider (PT)  Rulon Abide PA     Onset Date/Surgical Date  --   2000 MS/ recent exacerbation in 03/2018   Prior Therapy  yes years ago and most recently 2 x in january but with 2 hospital ED to hospital admissions for Relalpse remitting MS      Balance Screen   Has the patient fallen in the past 6 months  Yes    How many times?  4-5    Has the patient had a decrease in activity level because of a fear of falling?   Yes    Is the patient reluctant to leave their home because of a fear  of falling?   No      Home Environment   Living Environment  Skilled nursing facility   [facility]   Additional Comments  single level home with no steps       Prior Function   Level of Independence  Independent    Vocation  On disability    Leisure  walking       Cognition   Overall Cognitive Status  Within Functional Limits for tasks assessed      Sensation   Light Touch  Appears Intact    Additional Comments  WFL, reports some tingling in calves      AROM   Overall AROM Comments  lumbar AROM in seated position able to flex with hands to floor     Strength   Right Hip Flexion  4-/5    Left Hip Flexion  3-/5                           PT Education - 04/10/18 1535    Education Details  gave info for TENS unit to purchase for back pain    Person(s) Educated  Patient    Methods  Explanation;Handout    Comprehension  Verbalized understanding       PT Short Term Goals - 03/28/18 1141      PT SHORT TERM GOAL #1   Title  Patient will increase lumbar flexion by 25%     Baseline  ROM WFL that is not uncomfortable in lumbar region, tightness and discomfort noted moreso in thoracic region during ROM measurements    Status  Achieved      PT SHORT TERM GOAL #2   Title  Patient will increase gross bilateral lower extremity strength to 4+/5     Baseline  see flowsheet    Status  Not met      PT SHORT TERM GOAL #3   Title  Patient will be independent with initial HEP     Baseline  doing initial HEP    Status  Achieved        PT Long Term Goals - 03/08/18 1450      PT LONG TERM GOAL #1  Title  Patient will stand for 45 minutes without pain in order to perfrom ADL's     Time  8    Period  Weeks    Status Not met   Target Date  05/03/18      PT LONG TERM GOAL #2   Title  Patient will ambualte 3000' without increased pain in order to go to the mall     Time  8    Period  Weeks    Status  Not met   Target Date  05/03/18      PT LONG TERM GOAL  #3   Title  Patient will be independent with final HEP.     Time  8    Period  Weeks    Status  Not met   Target Date  05/03/18            Plan - 04/10/18 1644    Clinical Impression Statement  Pt enters clinic in [place] and reports she has been in the hospital and was released on Saturday.  Pt having difficulty getting out of W/C requiring Mod A to come to standing. after 2nd MS flare this month.  After pt mentioned she was presently living at [facility] for rehab, PT educated Ms. Simons on the need to attend one PT at a time and not to concurrently attend 2 places for PT.  With Ms. Schmiesing significant left LE weakness, she will most benefit from Neurorehabilitation if she still needs PT after REhab at [facility]. Pt was given information about a TENS unit that she can purchase on her own for pain releif for  her back.  Pt also reports foot drop and is presently getting a brace through rehab at skilled nursing /rehab facillity.  Pt will be discharged from outpatient PT for now.     PT Treatment/Interventions  ADLs/Self Care Home Management;Cryotherapy;Electrical Stimulation;Iontophoresis '4mg'$ /ml Dexamethasone;Traction;Ultrasound;Gait training;Stair training;Functional mobility training;Therapeutic exercise;Therapeutic activities;DME Instruction;Neuromuscular re-education;Patient/family education;Manual techniques;Manual lymph drainage;Passive range of motion;Dry needling;Taping    PT Next Visit Plan  DC due to Pt currently recieving care at [facility] and Agree with Plan of Care  Patient       Patient will benefit from skilled therapeutic intervention in order to improve the following deficits and impairments:  Abnormal gait, Pain, Decreased activity tolerance, Decreased endurance, Decreased range of motion, Decreased strength, Difficulty walking, Decreased balance, Increased muscle spasms, Improper body mechanics  Visit Diagnosis: Chronic bilateral low back pain without  sciatica  Muscle spasm of back     Problem List Patient Active Problem List   Diagnosis Date Noted  . MS (multiple sclerosis) ([place]) 03/14/2018  . CKD (chronic kidney disease), stage III ([place])   . Obesity (BMI 30.0-34.9)   . Anemia 01/09/2018  . Pulmonary embolism ([place]) 09/18/2017  . Elevated serum creatinine 05/12/2017  . Acute encephalopathy 04/20/2017  . Hydronephrosis of left kidney 10/24/2016  . Attention deficit 10/24/2016  . Left leg weakness 08/13/2016  . Chronic pain 08/13/2016  . Mild renal insufficiency 08/13/2016  . Left-sided weakness   . High risk medication use 09/15/2015  . Hip pain, bilateral 07/28/2015  . Urinary hesitancy 07/28/2015  . Multiple sclerosis exacerbation ([place]) 07/17/2015  . Urinary disorder 06/11/2015  . Gait disturbance 05/19/2015  . Numbness 05/19/2015  . Depression with anxiety 05/13/2015  . Other fatigue 05/13/2015  . Left knee pain 05/13/2015  . Avascular necrosis of bones of both hips ([place]) 05/13/2015  . Screening for HIV (  human immunodeficiency virus) 07/08/2014  . Essential hypertension, benign 11/19/2013  . Anxiety state, unspecified 11/19/2013  . Screen for STD (sexually transmitted disease) 11/01/2013  . Encounter for routine gynecological examination 11/01/2013  . Oral contraceptive use 10/20/2011  . Multiple sclerosis ([place]) 08/08/2007  . RASH AND OTHER NONSPECIFIC SKIN ERUPTION 06/22/2007  . AVASCULAR NECROSIS, FEMORAL HEAD 03/11/2006  . Essential hypertension 02/03/2006  . 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 DVT, HX OF 02/03/2006    Voncille Lo, PT Certified Exercise Expert for the Aging Adult  04/10/18 4:55 PM Phone: [phone] Fax: [facility] [address] [place], [place], [address] Phone: [phone]   Fax:  [phone]  Name: Avrianna Smart MRN: [id number] Date of Birth: 08-23-1980  PHYSICAL THERAPY DISCHARGE SUMMARY  Visits from  Start of Care: 2 plus one visit after 2 hospitalizations and MS flare.    Current functional level related to goals / functional outcomes: Pt with weakness noted in LE's and needs mod A x 1 to come to standing.  Hip flex right 3-/5 and left 4-/5    Remaining deficits: Not re evaluated due to currently attending Rehab at [facility] and getting a brace for foot drop   Education / Equipment: Initial HEP Plan: Patient agrees to discharge.  Patient goals were not met. Patient is being discharged due to a change in medical status.  ?????     Pt with MS exacerbation requiring assistance and care in a rehab/skilled nursing faciitly currently.  Pt given information about TENS unit for back pain.  When pt is discharged from rehab facility she may best be served by attending Neuro rehabilitation as outpatient if needed. Voncille Lo, PT Certified Exercise Expert for the Aging Adult  04/10/18 4:58 PM Phone: [phone] Fax: [phone]

## 2018-04-11 ENCOUNTER — Encounter: Payer: Medicare Other | Admitting: Physical Therapy

## 2018-04-12 ENCOUNTER — Encounter: Payer: Medicare Other | Admitting: Physical Therapy

## 2018-04-16 ENCOUNTER — Telehealth: Payer: Self-pay | Admitting: *Deleted

## 2018-04-16 NOTE — Telephone Encounter (Signed)
Faxed back signed order re: AFO brace for L foot drop at [phone]. Received fax confirmation.

## 2018-04-17 ENCOUNTER — Ambulatory Visit: Payer: Medicare Other | Admitting: Physical Therapy

## 2018-04-19 ENCOUNTER — Encounter: Payer: Medicare Other | Admitting: Physical Therapy

## 2018-04-24 ENCOUNTER — Encounter: Payer: Medicare Other | Admitting: Physical Therapy

## 2018-04-25 ENCOUNTER — Other Ambulatory Visit: Payer: Self-pay | Admitting: Neurology

## 2018-04-26 ENCOUNTER — Encounter: Payer: Medicare Other | Admitting: Physical Therapy

## 2018-05-01 ENCOUNTER — Encounter: Payer: Medicare Other | Admitting: Physical Therapy

## 2018-05-03 ENCOUNTER — Encounter: Payer: Medicare Other | Admitting: Physical Therapy

## 2018-05-07 ENCOUNTER — Telehealth: Payer: Self-pay | Admitting: Neurology

## 2018-05-07 NOTE — Telephone Encounter (Signed)
I called pt and advised labs were okay per Dr. Felecia Shelling. She is wondering when she can get scheduled for Ocrevus. Her phone was messed up and she recently got a new one so she may have missed some calls from infusion suite. Vincenza Hews on the phone and I will have her call her back about this. Verified phone number correct.

## 2018-05-07 NOTE — Telephone Encounter (Signed)
Pt called requesting her lab results. Please advise 

## 2018-05-08 ENCOUNTER — Encounter: Payer: Medicare Other | Admitting: Physical Therapy

## 2018-05-10 ENCOUNTER — Encounter: Payer: Medicare Other | Admitting: Physical Therapy

## 2018-05-15 ENCOUNTER — Encounter: Payer: Medicare Other | Admitting: Physical Therapy

## 2018-05-17 ENCOUNTER — Encounter: Payer: Medicare Other | Admitting: Physical Therapy

## 2018-05-22 ENCOUNTER — Encounter: Payer: Medicare Other | Admitting: Physical Therapy

## 2018-05-22 ENCOUNTER — Other Ambulatory Visit: Payer: Self-pay | Admitting: Neurology

## 2018-05-22 MED ORDER — METOPROLOL SUCCINATE ER 25 MG PO TB24: 25.0000 mg | ORAL_TABLET | Freq: Every day | ORAL | 5 refills | Status: DC

## 2018-05-22 NOTE — Progress Notes (Signed)
Her tremor is worse.   I will send in metoprolol 25 mg

## 2018-05-24 ENCOUNTER — Encounter: Payer: Medicare Other | Admitting: Physical Therapy

## 2018-05-24 ENCOUNTER — Telehealth: Payer: Self-pay | Admitting: Neurology

## 2018-05-24 NOTE — Telephone Encounter (Signed)
I called patient. Advised I spoke with Dr. Felecia Shelling and he already called in metoprolol 25mg  XL qd to pharmacy on 05/22/18 instead of her twice daily. She verbalized understanding.

## 2018-05-24 NOTE — Addendum Note (Signed)
Addended by: Hope Pigeon on: 05/24/2018 04:22 PM   Modules accepted: Orders

## 2018-05-24 NOTE — Telephone Encounter (Signed)
I called patient back. She has been having hand tremors, bilaterally. This has been a chronic issue per pt. She was on a medication last year when she was in nursing home but cannot remember the name of the medication. Advised I will discuss with Dr. Felecia Shelling and call back.

## 2018-05-24 NOTE — Telephone Encounter (Signed)
Pt is asking for something to be called in for her tremmors  [employer] [phone]

## 2018-05-25 NOTE — Telephone Encounter (Signed)
Benetta Spar PA @ [facility] called to verify medication change below. They will update her records. Also scheduled appt for 08/02/18 at 230pm.

## 2018-05-28 ENCOUNTER — Telehealth: Payer: Self-pay | Admitting: Neurology

## 2018-05-28 NOTE — Telephone Encounter (Signed)
I called Dawn Peters again, [name] was at lunch. Advised I will call  Back and speak with her

## 2018-05-28 NOTE — Telephone Encounter (Signed)
I called and spoke with [name]. Clarified pt in office for infusion on 05/22/18. She verbalized understanding.

## 2018-05-28 NOTE — Telephone Encounter (Signed)
I called pt back. She is at nursing home. She got her infusion last Tuesday at our office (05/22/18).   Advised when I spoke with them they asked if she saw Dr. Felecia Shelling which I verified she had not. She wants me to call [phone], and ask to speak with nurse to verify she was at our office for infusion. Advised I will do this.

## 2018-05-28 NOTE — Telephone Encounter (Signed)
Pt called she said [facility] is saying she did not keep her infusion appt on 3/17. Barnett Applebaum, Rn from [facility] called and got the infusion suite but was told Mia was passing out medication and could not talk. Pt is somewhat confused, but if Terrence Dupont could call her back at [phone] as I don't have capabilities to transfer at this time.

## 2018-05-28 NOTE — Telephone Encounter (Signed)
I tried calling [facility] at [phone] twice and got busy signal twice

## 2018-06-05 ENCOUNTER — Telehealth: Payer: Self-pay | Admitting: Neurology

## 2018-06-05 NOTE — Telephone Encounter (Signed)
Pt(someone from [facility] ) has called for the intrafusion suite, call transferred

## 2018-06-06 ENCOUNTER — Telehealth: Payer: Self-pay | Admitting: *Deleted

## 2018-06-06 NOTE — Telephone Encounter (Signed)
I called [facility] at [phone] and spoke with Lenna Sciara (nurse). Advised pt received Ocrevus 05/22/18 at our office. However, since she was previously on [place], she has to have monthly labs for 4 years past last infusion which was in 2018.  She is due for labs.   I gave VO for hepatic function panel, UA, TSH, CD4. She will talk with pt and let her know labs needed and they can order.  Pt was moved to another room last night and talked about going to another facility. She may leave AMA per Melissa. She will let us know if anything changes.

## 2018-06-14 ENCOUNTER — Other Ambulatory Visit: Payer: Self-pay | Admitting: *Deleted

## 2018-06-14 DIAGNOSIS — G35 Multiple sclerosis: Secondary | ICD-10-CM

## 2018-06-14 DIAGNOSIS — Z79899 Other long term (current) drug therapy: Secondary | ICD-10-CM

## 2018-06-14 NOTE — Telephone Encounter (Signed)
I called [facility] and spoke with Shelly who transferred me to another hall. They advised pt went home last week. No longer at facility.  I called pt at [phone] and LVM asking her to call office back. She needs to have lab work completed monthly (CBC w/ diff, creatinine, serum, UA, microscopic, TSH and CD4) for 4 years after her last Lemtrada infusion which was 2018 even though she is now on Ocrevus.   Filled out new [facility] order form to be done via rems program/home draw. Waiting on MD signature, then I will fax.

## 2018-06-14 NOTE — Addendum Note (Signed)
Addended by: Inis Sizer D on: 06/14/2018 03:20 PM   Modules accepted: Orders

## 2018-06-14 NOTE — Telephone Encounter (Signed)
Pt called office back. She is moving into a house soon and does not want to set up lab draw at home yet. She will go to [facility] off [address]. She just got out of [facility] last week. I transferred her call to Beverlee Nims (intrafusion) about getting her second day (day 15) scheduled.

## 2018-06-14 NOTE — Telephone Encounter (Signed)
Spoke with Dr. Felecia Shelling. He would like the following labs ordered: CBC w/ diff, creatinine, serum, UA, microscopic, TSH. I placed future orders in epic and Ellison Hughs here ([facility]) released orders so pt can complete them at [facility]/[address]

## 2018-06-14 NOTE — Telephone Encounter (Signed)
Spoke with Valentina Gu, RN (intrafusion). Pt scheduled for second part of first infusion on 07/04/18.

## 2018-06-19 ENCOUNTER — Telehealth: Payer: Self-pay | Admitting: Obstetrics & Gynecology

## 2018-06-19 NOTE — Telephone Encounter (Signed)
Informed the patient of the new scheduled appointment due to the referral. Also educated of our new address. She has no questions at this time.

## 2018-06-20 ENCOUNTER — Telehealth: Payer: Self-pay

## 2018-06-20 NOTE — Telephone Encounter (Signed)
I called patient to follow up on the labs she needs to have drawn, she had stated that she was going to go to [facility] on [address] [facility].  She states that she had forgotten but will definitely try to get this done next Tuesday.

## 2018-06-25 ENCOUNTER — Encounter: Payer: Self-pay | Admitting: *Deleted

## 2018-06-27 ENCOUNTER — Other Ambulatory Visit: Payer: Self-pay | Admitting: Neurology

## 2018-06-27 MED ORDER — AMPHETAMINE-DEXTROAMPHET ER 20 MG PO CP24: 20.0000 mg | ORAL_CAPSULE | Freq: Every day | ORAL | 0 refills | Status: DC

## 2018-06-27 NOTE — Telephone Encounter (Signed)
Called pt to see if she had labs done yesterday. She did not. She keeps forgetting. She has been having car issues. She is going to pick car up today. She will try and get labs done today or tomorrow.

## 2018-06-27 NOTE — Telephone Encounter (Signed)
Pt called in and stated she needs a refill of amphetamine-dextroamphetamine (ADDERALL XR) 20 MG 24 hr capsule sent to [facility], [place] AT [place]

## 2018-07-02 NOTE — Telephone Encounter (Signed)
Called pt to see if she had labs completed. LVM for her to call back.

## 2018-07-04 ENCOUNTER — Encounter: Payer: Self-pay | Admitting: *Deleted

## 2018-07-04 NOTE — Telephone Encounter (Signed)
Spoke w/ Dr. Felecia Shelling. Pt has yet to complete monthly labs for Lemtr[place]. I have sent pt a letter asking she schedule this at her earliest convenience and the importance of completing these labs.

## 2018-07-05 ENCOUNTER — Telehealth: Payer: Self-pay | Admitting: *Deleted

## 2018-07-05 ENCOUNTER — Other Ambulatory Visit: Payer: Self-pay | Admitting: *Deleted

## 2018-07-05 DIAGNOSIS — G35 Multiple sclerosis: Secondary | ICD-10-CM

## 2018-07-05 DIAGNOSIS — Z79899 Other long term (current) drug therapy: Secondary | ICD-10-CM

## 2018-07-05 LAB — CBC WITH DIFFERENTIAL/PLATELET
Basophils Absolute: 0 10*3/uL (ref 0.0–0.2)
Basos: 1 %
EOS (ABSOLUTE): 0 10*3/uL (ref 0.0–0.4)
Eos: 1 %
Hematocrit: 39.1 % (ref 34.0–46.6)
Hemoglobin: 14.2 g/dL (ref 11.1–15.9)
Immature Grans (Abs): 0 10*3/uL (ref 0.0–0.1)
Immature Granulocytes: 1 %
Lymphocytes Absolute: 0.5 10*3/uL — ABNORMAL LOW (ref 0.7–3.1)
Lymphs: 11 %
MCH: 34 pg — ABNORMAL HIGH (ref 26.6–33.0)
MCHC: 36.3 g/dL — ABNORMAL HIGH (ref 31.5–35.7)
MCV: 94 fL (ref 79–97)
Monocytes Absolute: 0.5 10*3/uL (ref 0.1–0.9)
Monocytes: 11 %
Neutrophils Absolute: 3.4 10*3/uL (ref 1.4–7.0)
Neutrophils: 75 %
Platelets: 328 10*3/uL (ref 150–450)
RBC: 4.18 x10E6/uL (ref 3.77–5.28)
RDW: 14.2 % (ref 11.7–15.4)
WBC: 4.4 10*3/uL (ref 3.4–10.8)

## 2018-07-05 LAB — URINALYSIS, ROUTINE W REFLEX MICROSCOPIC
Bilirubin, UA: NEGATIVE
Glucose, UA: NEGATIVE
Ketones, UA: NEGATIVE
Leukocytes,UA: NEGATIVE
Nitrite, UA: NEGATIVE
Protein,UA: NEGATIVE
RBC, UA: NEGATIVE
Specific Gravity, UA: 1.028 (ref 1.005–1.030)
Urobilinogen, Ur: 0.2 mg/dL (ref 0.2–1.0)
pH, UA: 5.5 (ref 5.0–7.5)

## 2018-07-05 LAB — CREATININE, SERUM
Creatinine, Ser: 1.27 mg/dL — ABNORMAL HIGH (ref 0.57–1.00)
GFR calc Af Amer: 61 mL/min/{1.73_m2} (ref >59)
GFR calc non Af Amer: 53 mL/min/{1.73_m2} — ABNORMAL LOW (ref >59)

## 2018-07-05 LAB — TSH: TSH: 2.2 u[IU]/mL (ref 0.450–4.500)

## 2018-07-05 NOTE — Telephone Encounter (Signed)
-----   Message from Britt Bottom, MD sent at 07/05/2018  9:06 AM EDT ----- Please let her know labs are ok

## 2018-07-05 NOTE — Telephone Encounter (Signed)
Noted, thank you

## 2018-07-05 NOTE — Telephone Encounter (Signed)
Pt called back and was informed that her labs are ok. Pt verbalized understanding.

## 2018-07-05 NOTE — Telephone Encounter (Signed)
Called pt, mailbox full and could not leave message

## 2018-07-09 ENCOUNTER — Telehealth: Payer: Self-pay | Admitting: *Deleted

## 2018-07-09 NOTE — Telephone Encounter (Signed)
Chart updated

## 2018-07-10 ENCOUNTER — Ambulatory Visit: Payer: Medicare Other | Admitting: Neurology

## 2018-07-10 ENCOUNTER — Other Ambulatory Visit: Payer: Self-pay

## 2018-07-17 ENCOUNTER — Other Ambulatory Visit: Payer: Self-pay | Admitting: Neurology

## 2018-07-17 MED ORDER — AMPHETAMINE-DEXTROAMPHET ER 20 MG PO CP24: 20.0000 mg | ORAL_CAPSULE | Freq: Every day | ORAL | 0 refills | Status: DC

## 2018-07-17 NOTE — Telephone Encounter (Signed)
Pt called in for refill of amphetamine-dextroamphetamine (ADDERALL XR) 20 MG 24 hr capsule to be sent to [facility] on [address]

## 2018-07-17 NOTE — Telephone Encounter (Signed)
Called pt. She was last seen 03/28/2018 and Dr. Felecia Shelling wanted her to f/u in 4 months. She no showed appt on 07/10/18. She states she never received email. I rescheduled appt for 07/25/18 at 11am. Emailed her link again at [email or web address]. She will call back today if she does not receive email.  I verified med list/pharamcy/allergies on file up to date.  I checked drug registry. She last refilled adderall 06/05/18 #30.

## 2018-07-24 ENCOUNTER — Ambulatory Visit: Payer: Medicare Other

## 2018-07-25 ENCOUNTER — Other Ambulatory Visit: Payer: Self-pay

## 2018-07-25 ENCOUNTER — Encounter: Payer: Self-pay | Admitting: Neurology

## 2018-07-25 ENCOUNTER — Ambulatory Visit (INDEPENDENT_AMBULATORY_CARE_PROVIDER_SITE_OTHER): Payer: Medicare Other | Admitting: Neurology

## 2018-07-25 DIAGNOSIS — R29898 Other symptoms and signs involving the musculoskeletal system: Secondary | ICD-10-CM | POA: Diagnosis not present

## 2018-07-25 DIAGNOSIS — R5383 Other fatigue: Secondary | ICD-10-CM

## 2018-07-25 DIAGNOSIS — G35 Multiple sclerosis: Secondary | ICD-10-CM | POA: Diagnosis not present

## 2018-07-25 DIAGNOSIS — G8929 Other chronic pain: Secondary | ICD-10-CM

## 2018-07-25 DIAGNOSIS — F418 Other specified anxiety disorders: Secondary | ICD-10-CM

## 2018-07-25 DIAGNOSIS — N399 Disorder of urinary system, unspecified: Secondary | ICD-10-CM

## 2018-07-25 DIAGNOSIS — R41840 Attention and concentration deficit: Secondary | ICD-10-CM | POA: Diagnosis not present

## 2018-07-25 MED ORDER — CITALOPRAM HYDROBROMIDE 20 MG PO TABS: 20.0000 mg | ORAL_TABLET | Freq: Every day | ORAL | 11 refills | Status: DC

## 2018-07-25 MED ORDER — BUSPIRONE HCL 15 MG PO TABS: 15.0000 mg | ORAL_TABLET | Freq: Two times a day (BID) | ORAL | 11 refills | Status: DC

## 2018-07-25 MED ORDER — DALFAMPRIDINE ER 10 MG PO TB12: 10.0000 mg | ORAL_TABLET | Freq: Two times a day (BID) | ORAL | 3 refills | Status: DC

## 2018-07-25 MED ORDER — AMPHETAMINE-DEXTROAMPHET ER 20 MG PO CP24: 20.0000 mg | ORAL_CAPSULE | Freq: Every day | ORAL | 0 refills | Status: DC

## 2018-07-25 MED ORDER — TIZANIDINE HCL 4 MG PO TABS: 4.0000 mg | ORAL_TABLET | Freq: Every day | ORAL | 0 refills | Status: DC

## 2018-07-25 MED ORDER — TAMSULOSIN HCL 0.4 MG PO CAPS: 0.4000 mg | ORAL_CAPSULE | Freq: Every day | ORAL | 11 refills | Status: DC

## 2018-07-25 MED ORDER — LAMOTRIGINE 200 MG PO TABS: 200.0000 mg | ORAL_TABLET | Freq: Two times a day (BID) | ORAL | 11 refills | Status: DC

## 2018-07-25 MED ORDER — BACLOFEN 20 MG PO TABS: 20.0000 mg | ORAL_TABLET | Freq: Four times a day (QID) | ORAL | 3 refills | Status: DC

## 2018-07-25 MED ORDER — GABAPENTIN 800 MG PO TABS: 800.0000 mg | ORAL_TABLET | Freq: Four times a day (QID) | ORAL | 11 refills | Status: DC

## 2018-07-25 NOTE — Progress Notes (Signed)
Some   [facility]  PATIENT: Dawn Peters DOB: Jun 27, 1980  REFERRING DOCTOR OR PCP:  Dr. Annitta Needs SOURCE: Patient, records in EMR, lab results and radiology reports in EMR, MRI images reviewed on PACS.  _________________________________   HISTORICAL  CHIEF COMPLAINT:  Chief Complaint  Patient presents with   Multiple Sclerosis    on Ocrevus now    HISTORY OF PRESENT ILLNESS:   Dawn Peters is a 38 y.o. woman who was diagnosed with MS in 2000.     Update 07/25/2018: Virtual Visit via Telephone Note  I connected with Dawn Peters on 07/25/18 at 11:00 AM EDT by telephone and verified that I am speaking with the correct person using two identifiers.  Location: Patient: home Provider: office   I discussed the limitations, risks, security and privacy concerns of performing an evaluation and management service by telephone and the availability of in person appointments. I also discussed with the patient that there may be a patient responsible charge related to this service. The patient expressed understanding and agreed to proceed.  History of Present Illness: She feels her MS has been stable and she denies any new exacerbations.  She had her initial dose of Ocrevus about a week ago and will have the second half of the dose next week.  The transition from [place] to [place] was delayed due to her being in a skilled nursing facility after her last hospital visit for further rehabilitation of her gait.  She does feel that her gait is doing better now than it did a couple months ago.  She has not had any recent falls.  Most difficulties are due to the left side being weak and spastic.  She has a left foot drop.  She feels dalfampridine has helped her gait some.  Baclofen helps the spasticity and the addition of nighttime tizanidine has helped the more spasticity at night.  She continues to have urinary hesitancy but tamsulosin has helped.  She  notes continued pain from the muscle spasticity those medications have helped.  She has fatigue.  Adderall has helped the fatigue some and also helps her focus better.  She is sleeping better at night now than she did previously.  She feels mood is doing okay though she has felt a little more down over the past few months.  In the past she was on citalopram and felt it helped.  I can get her back on that medication.  BuSpar has helped with the anxiety.  Her vitamin D has been low and was just represcribed high-dose supplementation.   Observations/Objective: She is alert and fully oriented with fluent speech and good attention, knowledge of memory.  Assessment and Plan: Multiple sclerosis ([place])  Left leg weakness  Urinary disorder  Attention deficit  Other chronic pain  Depression with anxiety  Other fatigue  1.   She will continue Ocrevus for her multiple sclerosis.  We may check some lab work around the time of her next visit to determine if the IgG and IgM are stable. 2.   Stay active and exercise as tolerated.  Use the walker as needed. 3.   Continue vitamin D supplementation. 4.   He had a conversation about Ocrevus might place her on a higher risk to contract the SARS-CoV-2 virus and that COVID-19 might be worse if she does get the disease. 5.   She will return to see me in 5 to 6 months or sooner if there are new or worsening  neurologic symptoms.  Follow Up Instructions: I discussed the assessment and treatment plan with the patient. The patient was provided an opportunity to ask questions and all were answered. The patient agreed with the plan and demonstrated an understanding of the instructions.  The patient was advised to call back or seek an in-person evaluation if the symptoms worsen or if the condition fails to improve as anticipated.  I provided 22 minutes of non-face-to-face time during this encounter.  Yassin Scales A. Felecia Shelling, MD, PhD, FAAN Certified in Neurology, Clinical  Neurophysiology, Sleep Medicine, Pain Medicine and Neuroimaging Director, [place] [address] [place], [place] [address] [phone]  ____________________________________ From previous visits: Update 03/28/2018: She went to [facility] emergency room 03/13/2018 due to more left-sided symptoms.  While there, she had an MRI of the brain and cervical spine.  I looked at the actual MRI images and the reports.  She has stable periventricular white matter changes consistent with MS without evidence of disease progression when compared to her previous MRI.  There were no new spinal cord lesions noted.  Hydroureternephrosis on the left was again demonstrated (she has seen Dr. Louis Meckel about this in the past).  I was contacted by Dr. Erlinda Hong during that emergency room visit.  Although an MS exacerbation was unlikely (she has multiple other emergency room visits for identical symptoms), in the past she has felt symptom relief with IV steroids so a 1000 mg dose was ordered x 3 days.    She feels her left leg is better but still weak and she is tripping more.     She started PT.   They are recommending a new brace that she can sleep in.    She is on baclofen 20 mg up to 4 times a day.    Ampyra helped her walking.     I personally reviewed notes, labs and imaging reports from her recent [facility] emergency room and hospital admission and personally reviewed the MRI images and compare them to her previous scans  She would like ot switch off the Tecfidera.  In the past, we had discussed Ocrevus as another option.   She had her [place] courses in 2017 and 2018.  She has had some elevation of her creatinine.   This is likely related to her hydronephrosis on the left and she states she has 10% kidney function on that side and 40-50% on the other side.    Update 10/19/2017: She switched over to Tecfidera earlier this year.  She  tolerates it well.     She is noting more weakness and spasticity in the left leg.  Balance is poor.  She needs to use a cane.  In the past, she has felt better when she received a couple days of IV steroids.  She notes a little bit more spasticity in the legs.   She takes baclofen and tizanidine 4 times a day and is unsure how much they are helping.     She is noting more leg pain.   She felt leg pain did better on gabapentin than on Lyrica.  She also notes more fatigue the last couple weeks.  She is sleeping well most nights.  She has some urinary frequency but feels this is stable.  She is still on Eliquis.  She had an Ultrasound to check up on the clot this morning.   Her migraine headaches are doing well  Update 09/20/2017:  She had two cycles of Lemtrada (2017 and 2018) and switched to Tecfidera a few months ago as she was still having progression (has active SPMS) and  She has left sided leg weakness and spasticity and has reduced gait.     She uses a cane.     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     She has some fatigue but this is stable.    She sleeps well most nights.    Migraines are doing well.     Her creatinine has ben mildly elevated 1.3 to 1.4 but stable   Anti-GBM Ab was fine.  Update 06/21/2017: She has an active form of SPMS.  She re-presented with worsening left sided weakness and more trouble walking a few days after her last visit.  Imaging studies did not show any new lesions.  She was given IV solu-medrol but did not feel she got any better.   She then was d/c to [facility] for Rehab.     Her last Lemtrada was 10/10/2016-10/12/16.      She did not follow up with nephrology appointment scheduled for March.   Creatinine has been increased.   During last admission, she was evaluated by medicine and received IV fluids and creatinine improved to 1.39-1.41.  One year ago  creatinine was 0.9.      Patients with [name] can have anti-glomerular membrane antibodies and this was checked 05/17/2017 and was normal.   There also have been a few cases of other glomerulonephropathy seen in clinical studies and post-marketing data.     She is not on any NSAID's.   Lab and imaging reports, MRI images on PACS and admission discharge notes from her recent admission were reviewed  Update 05/17/2017:   She had a second course of Lemtrada in August 2018.   She notes her left leg is worse.  It is weaker and she is falling more.  She was in the hospital last month due to hypersomnia and generalized weakness. No definite etiology was determined. Drug screen was negative. Urinalysis showed small amount of hemoglobin but no protein or casts.   Repeat brain MRI showed no new lesions.   She received 3 days of IV Solu-Medrol.  Admission and discharge notes were reviewed.Her creatinine has been elevated with the last reading 1.52.    She is scheduled to see nephrology next week.  She also has apparent hydronephrosis on MRI and I referred her to urology last October but she does not recall going.   She is on Ampyra and feels it helps her strength some.     Update 12/28/2016:   She had her 3 day course of Lemtrada in August. She tolerated it well. She has not had any exacerbations while on Lemtrada for the MS. Her last MRI was in June 2018 and did not show any new lesions. She continues to have difficulty with her gait due to the left leg weakness and spasticity. An AFO initially helped her walk but she is having trouble with fit that she believes is worse due to weight gain. Balance is also mildly off. Spasticity is predominantly in the left leg she gets some benefit with baclofen and tizanidine.    She has painful dysesthesias of the left leg helped partially by gabapentin and lamotrigine. She  also sees pain management for opiates. She has worsening urinary hesitancy this year and an MRI of the thoracic  spine showed hydronephrosis. She has fatigue is both physical and cognitive. It is worse in the heat so she feels it is doing better than a couple months ago. Well most nights. Mood has done better with citalopram and BuSpar.  ' From 10/24/2016: MS:   She had her 3 day course of Lemtrada in the office a couple weeks ago and thee infusions went well. . She is JCV high titer positive and had switched from Tysabri to [place] earlier.     Gait/strength:    Gait has worsened due mostly to the left leg weakness and spasticity over the last year. She got her left AFO and is starting to get used to it.   She has trouble lifting the left leg/foot.  Ba;lance is off.     She is on Ampyra with some benefit.  Arms are doing ok.  She has left sided spasticity, only partially helped by baclofen and tizanidine.  Dysesthesia:   Patient continues to report painful left leg dysesthesias. These are similar to the last visit.  .  She is currently on gabapentin 800 mg by mouth 4 times a day and lamotrigine 200 mg twice a day for the dysesthetic pain.  With her medications and tramadol she feels that the pain is tolerable.   Vision:    She denies any MS related visual problems.  Bladder:   Urinary hesitancy continues to be a problem. She gets some benefit from tamsulosin. MRI of the thoracic spine with recent hospitalization shows hydronephrosis on the left.  She has not yet been referred to urology.  Bowel is fine..  Fatigue/sleep:   She is noting more trouble with her fatigue, no occurring daily.  . She does especially poorly in the hear.  She denies sleepiness -- issues are more fatigue.   She has never tried a stimulant.   She sleeps well most nights.  Mood:  Her depression and anxiety is better with citalopram 40 mg and buspar bid.    Anxiety is more of a problem than depression and she feels that the medications are not completely controlling this.  Cognition:   She denies significant difficulties with her  cognitive abilities.   Focus is mildly reduced.  MS history:   She was diagnosed with MS in 2000 after presenting with gait difficulties, numbness and headaches. An MRI of the brain was consistent with MS. She was then started on Betaseron. She had difficulty tolerating Betaseron and at some point switched to Novantrone. She took Novantrone every 3 months for a year or so. A little later, she was placed on Tysabri and she stayed on it for a couple of years. However, she was JCV-positive and switched off. She did well on Tysabri. She had been on Tecfidera since 2015. Her MRI from June 2015 showed that there had been no changes compared to an MRI from 2014.   However, she had an exacerbation March 2017.   The 08/07/2013 MRI of the brain shows multiple T2/FLAIR hyperintense foci located in the cerebellum, middle cerebellar peduncles, pons and in the periventricular white matter of the hemispheres in a pattern and configuration consistent with chronic demyelinating plaque associated with MS. There were no acute findings. There was no change compared to the previous MRI from 01/16/2013 that was also reviewed. An MRI of the cervical spine shows subtle T2 hyperintense signal within the cord  and there were no acute findings.  REVIEW OF SYSTEMS: Constitutional: No fevers, chills, sweats, or change in appetite.   She has fatigue.  Some insomnia. Eyes: No visual changes, double vision, eye pain Ear, nose and throat: No hearing loss, ear pain, nasal congestion, sore throat Cardiovascular: No chest pain, palpitations Respiratory: No shortness of breath at rest or with exertion.   No wheezes GastrointestinaI: No nausea, vomiting, diarrhea, abdominal pain, fecal incontinence Genitourinary: No dysuria.   She has hesitancy. Sometimes she has difficulty emptying completely Flomax has helped urinary hesitancy. 1 x nocturia. Musculoskeletal:She notes pain in the left leg and some back pain Integumentary: No rash,  pruritus, skin lesions Neurological: as above Psychiatric: Mild depression at this time.  Moderate anxiety Endocrine: No palpitations, diaphoresis, change in appetite, change in weigh or increased thirst Hematologic/Lymphatic: No anemia, purpura, petechiae. Allergic/Immunologic: No itchy/runny eyes, nasal congestion, recent allergic reactions, rashes  ALLERGIES: Allergies  Allergen Reactions   Ace Inhibitors Swelling   Amoxicillin Itching    Did it involve swelling of the face/tongue/throat, SOB, or low BP? Yes Did it involve sudden or severe rash/hives, skin peeling, or any reaction on the inside of your mouth or nose? No Did you need to seek medical attention at a hospital or doctor's office? No When did it last happen?unknown  If all above answers are NO, may proceed with cephalosporin use.;   Celexa [Citalopram] Swelling    mouth   Lisinopril Swelling    Lower lip swelling    HOME MEDICATIONS:  Current Outpatient Medications:    amantadine (SYMMETREL) 100 MG capsule, Take 100 mg by mouth 2 (two) times daily., Disp: , Rfl: 2   amitriptyline (ELAVIL) 25 MG tablet, TAKE 1-2 TABLETS BY MOUTH AT BEDTIME (Patient taking differently: Take 50 mg by mouth at bedtime. ), Disp: 180 tablet, Rfl: 0   amLODipine (NORVASC) 5 MG tablet, Take 1 tablet (5 mg total) by mouth daily. (Patient taking differently: Take 5 mg by mouth daily after breakfast. ), Disp: 90 tablet, Rfl: 0   amphetamine-dextroamphetamine (ADDERALL XR) 20 MG 24 hr capsule, Take 1 capsule (20 mg total) by mouth daily., Disp: 30 capsule, Rfl: 0   apixaban (ELIQUIS) 5 MG TABS tablet, Take 1 tablet (5 mg total) by mouth 2 (two) times daily., Disp: 60 tablet, Rfl: 0   baclofen (LIORESAL) 20 MG tablet, Take 1 tablet (20 mg total) by mouth 4 (four) times daily., Disp: 360 tablet, Rfl: 3   busPIRone (BUSPAR) 15 MG tablet, Take 1 tablet (15 mg total) by mouth 2 (two) times daily., Disp: 60 tablet, Rfl: 11    citalopram (CELEXA) 20 MG tablet, Take 1 tablet (20 mg total) by mouth daily., Disp: 30 tablet, Rfl: 11   dalfampridine 10 MG TB12, Take 1 tablet (10 mg total) by mouth 2 (two) times daily., Disp: 180 tablet, Rfl: 3   gabapentin (NEURONTIN) 800 MG tablet, Take 1 tablet (800 mg total) by mouth 4 (four) times daily., Disp: 120 tablet, Rfl: 11   lamoTRIgine (LAMICTAL) 200 MG tablet, Take 1 tablet (200 mg total) by mouth 2 (two) times daily., Disp: 60 tablet, Rfl: 11   metoprolol succinate (TOPROL XL) 25 MG 24 hr tablet, Take 1 tablet (25 mg total) by mouth daily., Disp: 30 tablet, Rfl: 5   Multiple Vitamin (MULTIVITAMIN WITH MINERALS) TABS tablet, Take 1 tablet by mouth daily., Disp: 30 tablet, Rfl: 0   Oxcarbazepine (TRILEPTAL) 300 MG tablet, Take 1 tablet (300 mg total) by mouth  2 (two) times daily., Disp: 60 tablet, Rfl: 1   oxyCODONE-acetaminophen (PERCOCET) 10-325 MG tablet, Take 1 tablet by mouth every 4 (four) hours., Disp: , Rfl:    polyethylene glycol (MIRALAX / GLYCOLAX) packet, Take 17 g by mouth daily., Disp: 14 each, Rfl: 0   SUMAtriptan (IMITREX) 100 MG tablet, Take 1 tablet (100 mg total) by mouth once as needed for migraine. May repeat in 2 hours if headache persists or recurs., Disp: 10 tablet, Rfl: 3   tamsulosin (FLOMAX) 0.4 MG CAPS capsule, Take 1 capsule (0.4 mg total) by mouth daily., Disp: 30 capsule, Rfl: 11   tiZANidine (ZANAFLEX) 4 MG tablet, Take 1 tablet (4 mg total) by mouth at bedtime., Disp: 30 tablet, Rfl: 0   valACYclovir (VALTREX) 500 MG tablet, Take 1 tablet (500 mg total) by mouth 2 (two) times daily., Disp: 60 tablet, Rfl: 2  PAST MEDICAL HISTORY: Past Medical History:  Diagnosis Date   Anticoagulant long-term use    Eliquis   Anxiety    Chronic pain    CKD (chronic kidney disease), stage III ([place])    Depression    DVT, lower extremity, 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   left lower extremity-- treated with eliquis   Gait disturbance    due to  MS   GERD (gastroesophageal reflux disease)    watch diet   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/p  THA   History of DVT of lower extremity 2007   History of encephalopathy 16/1096   acute metabolic encephalopathy secondary to MS,  resolved   History of MRSA infection 2004   right hip infection post THA   History of pulmonary embolus (PE) 2005   Hydronephrosis, left    w/ acute kidney injury 02/ 2019   Hypertension    Left-sided weakness    due to MS   Migraines    MS (multiple sclerosis) [facility]) neurologist-  dr Renne Musca   dx 2000--   Muscle spasticity    Neurogenic bladder    due to MS   Pulmonary embolism, bilateral ([place]) 09/17/2017   treated w/ eliquis   Strains to urinate    Urgency of urination     PAST SURGICAL HISTORY: Past Surgical History:  Procedure Laterality Date   CYSTOSCOPY WITH RETROGRADE PYELOGRAM, URETEROSCOPY AND STENT PLACEMENT Bilateral 11/29/2017   Procedure: BILATERAL  RETROGRADE PYELOGRAM, LEFT DIAGNOSIC URETEROSCOPY AND STENT LEFT PLACEMENT;  Surgeon: Ardis Hughs, MD;  Location: [facility];  Service: Urology;  Laterality: Bilateral;   HIP ARTHROSCOPY Left 07-05-2013   dr pill @[facility]    iliopsosas release, synovectomy   REVISION TOTAL HIP ARTHROPLASTY Right 03-28-2003    dr Alvan Dame @[facility]    TOTAL HIP ARTHROPLASTY Bilateral left 11-05-2002  dr Alvan Dame @[facility] ;   right 06/ 2004  @[facility]     FAMILY HISTORY: Family History  Problem Relation Age of Onset   Healthy Mother    Healthy Father    Breast cancer Other        paternal grandmother dx age 21   Colon cancer Other        paternal grandmother   Hypertension Other    Diabetes Other     SOCIAL HISTORY:  Social History   Socioeconomic History   Marital status: Married    Spouse name: Not on file   Number of children: Not on file   Years of education: Not on file   Highest education level:  Not  on file  Occupational History   Not on file  Social Needs   Financial resource strain: Not on file   Food insecurity:    Worry: Not on file    Inability: Not on file   Transportation needs:    Medical: Not on file    Non-medical: Not on file  Tobacco Use   Smoking status: Never Smoker   Smokeless tobacco: Never Used  Substance and Sexual Activity   Alcohol use: No   Drug use: No   Sexual activity: Not Currently    Birth control/protection: Pill  Lifestyle   Physical activity:    Days per week: Not on file    Minutes per session: Not on file   Stress: Not on file  Relationships   Social connections:    Talks on phone: Not on file    Gets together: Not on file    Attends religious service: Not on file    Active member of club or organization: Not on file    Attends meetings of clubs or organizations: Not on file    Relationship status: Not on file   Intimate partner violence:    Fear of current or ex partner: Not on file    Emotionally abused: Not on file    Physically abused: Not on file    Forced sexual activity: Not on file  Other Topics Concern   Not on file  Social History Narrative   Not on file     PHYSICAL EXAM  There were no vitals filed for this visit.  There is no height or weight on file to calculate BMI.   General: The patient is well-developed and well-nourished and in no acute distress.  Musculoskeletal:   She has good neck ROM.    Neurologic Exam  Mental status: The patient is alert and oriented x 3 at the time of the examination. The patient has apparent normal recent and remote memory, with mildly reduced attention span and concentration ability.   Speech is normal.  Cranial nerves: Extraocular movements are full.  Facial strength and sensation is normal.  Trapezius strength is normal.. No obvious hearing deficits are noted.  Motor:  Muscle bulk is normal.   Muscle tone is increased in the left leg.. Strength is  5 / 5 in all  4 extremities except 4/5 distal left foot.   Sensory: Touch and vibration sensation is normal in the arms or legs.  Coordination: Finger-nose-finger is performed well bilaterally.  Heel-to-shin is slightly reduced on the right and moderately reduced on the left.  Gait and station: Station is normal.  She can walk without a cane but has a left foot drop..  She cannot tandem walk.  She walks better with a cane and without.  Reflexes: Deep tendon reflexes are increased in the legs, left greater than right.  She has crossed abductor responses, worse on the left.  She has nonsustained clonus in the left ankle    DIAGNOSTIC DATA (LABS, IMAGING, TESTING) - I reviewed patient records, labs, notes, testing and imaging myself where available.  Lab Results  Component Value Date   WBC 4.4 07/04/2018   HGB 14.2 07/04/2018   HCT 39.1 07/04/2018   MCV 94 07/04/2018   PLT 328 07/04/2018      Component Value Date/Time   NA 137 04/04/2018 0645   NA 144 06/21/2017 1626   K 4.1 04/04/2018 0645   CL 104 04/04/2018 0645   CO2 27 04/04/2018 0645  GLUCOSE 135 (H) 04/04/2018 0645   BUN 16 04/04/2018 0645   BUN 16 06/21/2017 1626   CREATININE 1.27 (H) 07/04/2018 1530   CREATININE 0.85 07/03/2014 0959   CALCIUM 9.1 04/04/2018 0645   PROT 7.5 04/03/2018 0035   PROT 7.5 01/11/2018 1542   ALBUMIN 3.9 04/03/2018 0035   ALBUMIN 4.2 01/11/2018 1542   AST 15 04/03/2018 0035   ALT 13 04/03/2018 0035   ALKPHOS 68 04/03/2018 0035   BILITOT 0.4 04/03/2018 0035   BILITOT 0.5 01/11/2018 1542   GFRNONAA 53 (L) 07/04/2018 1530   GFRNONAA 88 07/03/2014 0959   GFRAA 61 07/04/2018 1530   GFRAA >89 07/03/2014 0959   Lab Results  Component Value Date   CHOL 228 (H) 04/20/2017   HDL 77 04/20/2017   LDLCALC 142 (H) 04/20/2017   TRIG 46 04/20/2017   CHOLHDL 3.0 04/20/2017   Lab Results  Component Value Date   HGBA1C 5.3 04/20/2017   Lab Results  Component Value Date   VITAMINB12 228 03/15/2018   Lab  Results  Component Value Date   TSH 2.200 07/04/2018      ASSESSMENT AND PLAN  Multiple sclerosis ([place])  Left leg weakness  Urinary disorder  Attention deficit  Other chronic pain  Depression with anxiety  Other fatigue   Kacie Huxtable A. Felecia Shelling, MD, PhD 9/50/7225, 75:05 PM Certified in Neurology, Clinical Neurophysiology, Sleep Medicine, Pain Medicine and Neuroimaging  [facility] [address], [place] [address] [place], [place] [address] [phone] ]

## 2018-07-26 ENCOUNTER — Telehealth: Payer: Self-pay | Admitting: Neurology

## 2018-07-26 NOTE — Telephone Encounter (Signed)
07/26/18 - LVM to schedule 5 mo f/u // sater

## 2018-08-02 ENCOUNTER — Ambulatory Visit: Payer: Self-pay | Admitting: Neurology

## 2018-08-13 ENCOUNTER — Telehealth: Payer: Self-pay | Admitting: Neurology

## 2018-08-13 MED ORDER — AMPHETAMINE-DEXTROAMPHET ER 20 MG PO CP24: 20.0000 mg | ORAL_CAPSULE | Freq: Every day | ORAL | 0 refills | Status: DC

## 2018-08-13 NOTE — Telephone Encounter (Signed)
Patient called in needing a refill on adderal and valtrex.

## 2018-08-17 NOTE — Telephone Encounter (Signed)
Pt has called back to inquire about why her valACYclovir (VALTREX) 500 MG tablet has not been filled yet.  Pt states has been without this medication for about 2 weeks

## 2018-08-20 NOTE — Telephone Encounter (Signed)
Dr. Ouida Sills she is on valtrex for Herpes prophylaxis per chart. Do you want her to ask PCP to refill from now on?

## 2018-08-20 NOTE — Telephone Encounter (Signed)
Called pt. Relayed Dr. Garth Bigness message. She will contact PCP for refill

## 2018-08-20 NOTE — Telephone Encounter (Signed)
If for herpes prophylaxis would be better for her PCP to write.

## 2018-08-20 NOTE — Telephone Encounter (Signed)
Dr. Felecia Shelling- do you still want her on Valtrex?

## 2018-08-20 NOTE — Telephone Encounter (Signed)
She doesn't need to stay on that long term after the [place]

## 2018-09-03 ENCOUNTER — Telehealth: Payer: Self-pay | Admitting: *Deleted

## 2018-09-03 ENCOUNTER — Other Ambulatory Visit: Payer: Self-pay | Admitting: *Deleted

## 2018-09-03 DIAGNOSIS — Z79899 Other long term (current) drug therapy: Secondary | ICD-10-CM

## 2018-09-03 DIAGNOSIS — G35 Multiple sclerosis: Secondary | ICD-10-CM

## 2018-09-03 NOTE — Telephone Encounter (Signed)
Called pt. Last lab results we have are from 07/05/2018. She has not had any since. Pt states she is still in process of moving and will provide new address to [place] (home draw program) once she moves. In the meantime, she will go to [facility] ([place], [place], [place]) tomorrow to get labs done again. I placed future orders in epic. Also asked that [place] ([facility] tech here at [facility]) release orders.

## 2018-09-06 NOTE — Telephone Encounter (Signed)
Tried calling pt to confirm she went and got labs completed on 09/04/2018 after we spoke on 09/03/18. Went to VM and VM full.

## 2018-09-10 NOTE — Telephone Encounter (Signed)
Called pt, LVM for her to call office to let me know if she had labs drawn last week after we spoke.

## 2018-09-13 ENCOUNTER — Other Ambulatory Visit: Payer: Self-pay | Admitting: Neurology

## 2018-09-13 ENCOUNTER — Encounter: Payer: Self-pay | Admitting: *Deleted

## 2018-09-13 NOTE — Telephone Encounter (Signed)
Called, LVM for pt to call office back today. Advised I have left a couple messages for her now.   I will also send pt letter about needing labs.

## 2018-09-14 LAB — URINALYSIS, ROUTINE W REFLEX MICROSCOPIC
Bilirubin, UA: NEGATIVE
Glucose, UA: NEGATIVE
Ketones, UA: NEGATIVE
Leukocytes,UA: NEGATIVE
Nitrite, UA: NEGATIVE
Protein,UA: NEGATIVE
RBC, UA: NEGATIVE
Specific Gravity, UA: 1.027 (ref 1.005–1.030)
Urobilinogen, Ur: 0.2 mg/dL (ref 0.2–1.0)
pH, UA: 6 (ref 5.0–7.5)

## 2018-09-14 LAB — CBC WITH DIFFERENTIAL/PLATELET
Basophils Absolute: 0 10*3/uL (ref 0.0–0.2)
Basos: 1 %
EOS (ABSOLUTE): 0.1 10*3/uL (ref 0.0–0.4)
Eos: 3 %
Hematocrit: 39.1 % (ref 34.0–46.6)
Hemoglobin: 13.5 g/dL (ref 11.1–15.9)
Immature Grans (Abs): 0 10*3/uL (ref 0.0–0.1)
Immature Granulocytes: 1 %
Lymphocytes Absolute: 0.5 10*3/uL — ABNORMAL LOW (ref 0.7–3.1)
Lymphs: 12 %
MCH: 31.5 pg (ref 26.6–33.0)
MCHC: 34.5 g/dL (ref 31.5–35.7)
MCV: 91 fL (ref 79–97)
Monocytes Absolute: 0.6 10*3/uL (ref 0.1–0.9)
Monocytes: 14 %
Neutrophils Absolute: 2.8 10*3/uL (ref 1.4–7.0)
Neutrophils: 69 %
Platelets: 328 10*3/uL (ref 150–450)
RBC: 4.29 x10E6/uL (ref 3.77–5.28)
RDW: 13.2 % (ref 11.7–15.4)
WBC: 4 10*3/uL (ref 3.4–10.8)

## 2018-09-14 LAB — CREATININE, SERUM
Creatinine, Ser: 1.31 mg/dL — ABNORMAL HIGH (ref 0.57–1.00)
GFR calc Af Amer: 59 mL/min/{1.73_m2} — ABNORMAL LOW (ref >59)
GFR calc non Af Amer: 51 mL/min/{1.73_m2} — ABNORMAL LOW (ref >59)

## 2018-09-14 LAB — TSH: TSH: 0.765 u[IU]/mL (ref 0.450–4.500)

## 2018-09-17 ENCOUNTER — Telehealth: Payer: Self-pay | Admitting: *Deleted

## 2018-09-17 NOTE — Telephone Encounter (Signed)
-----   Message from Britt Bottom, MD sent at 09/17/2018  3:45 PM EDT ----- Please let her know, Labs are similar to the previous month.

## 2018-09-26 NOTE — Telephone Encounter (Signed)
Took call from phone staff. Pt wanted to know what her lab results were. Advised they were similar to the previous month. She verbalized understanding.

## 2018-10-08 ENCOUNTER — Telehealth: Payer: Self-pay | Admitting: Neurology

## 2018-10-08 ENCOUNTER — Other Ambulatory Visit: Payer: Self-pay | Admitting: *Deleted

## 2018-10-08 DIAGNOSIS — N390 Urinary tract infection, site not specified: Secondary | ICD-10-CM

## 2018-10-08 NOTE — Telephone Encounter (Signed)
We can do 1 g of IV Solu-Medrol.  When she comes in we will also check to see if she has a urinary tract infection with UA and urine culture

## 2018-10-08 NOTE — Telephone Encounter (Signed)
Pt called back. Took call from phone staff. She reports that her walking has worsened in the last couple months and now using a walker. She feels she has worsened since starting on Ocrevus. She denies any signs of infection/UTI. She is requesting to come in for IV steroids, stating this is the only that has helped in the past. I did advised that normally we do that for MS exacerbations. She was addiment and stated she knew her body best and did not want to go to the ER to be treated if possible. Denies any new allergies. Advised I will have to get approval from Dr. Felecia Shelling and will call her back in the morning at the latest to let her know how to proceed. She verbalized understanding.

## 2018-10-08 NOTE — Telephone Encounter (Signed)
Pt called in and wanted to know if she can get a round of steroids to help her walk properly

## 2018-10-08 NOTE — Telephone Encounter (Signed)
Tried calling pt, someone picked up but no one on other line

## 2018-10-08 NOTE — Telephone Encounter (Signed)
Spoke with Dawn Peters with [facility]. We can fit pt in at 12pm or 2:30pm tomorrow.  I called pt. She will take 2:30pm appt. Advised she also needs to do UA while she is here to r/o UTI. She verbalized understanding. Placed her on lab schedule. Gave completed/signed orders to [facility].

## 2018-10-09 ENCOUNTER — Other Ambulatory Visit: Payer: Self-pay

## 2018-10-10 ENCOUNTER — Telehealth: Payer: Self-pay | Admitting: *Deleted

## 2018-10-10 LAB — MICROSCOPIC EXAMINATION
Bacteria, UA: NONE SEEN
Casts: NONE SEEN /lpf
Epithelial Cells (non renal): >10 /hpf — AB (ref 0–10)

## 2018-10-10 LAB — URINALYSIS, ROUTINE W REFLEX MICROSCOPIC
Bilirubin, UA: NEGATIVE
Glucose, UA: NEGATIVE
Ketones, UA: NEGATIVE
Nitrite, UA: NEGATIVE
Protein,UA: NEGATIVE
RBC, UA: NEGATIVE
Specific Gravity, UA: 1.016 (ref 1.005–1.030)
Urobilinogen, Ur: 0.2 mg/dL (ref 0.2–1.0)
pH, UA: 6 (ref 5.0–7.5)

## 2018-10-10 NOTE — Telephone Encounter (Signed)
Called and spoke with pt. Relayed results per Dr. Sater note. She verbalized understanding.  

## 2018-10-10 NOTE — Telephone Encounter (Signed)
-----   Message from Britt Bottom, MD sent at 10/10/2018  2:10 PM EDT ----- Please let the patient know that the urine labs were fine.  There was no infection.

## 2018-10-11 LAB — URINE CULTURE

## 2018-10-29 ENCOUNTER — Telehealth: Payer: Self-pay | Admitting: Neurology

## 2018-10-29 NOTE — Telephone Encounter (Signed)
Called pt. She is having leg weakness, especially left leg. No falls. No signs of infection. UA negative. States "she knows her body and prednisone the only thing that helps". Pt does not feel Ocrevus is helping, wanting to change DMT. I scheduled f/u for 10/30/18 at 11:30am with Dr. Felecia Shelling to further discuss concerns/treatment options.

## 2018-10-29 NOTE — Telephone Encounter (Signed)
Pt is requesting a refill of amphetamine-dextroamphetamine (ADDERALL XR) 20 MG 24 hr capsule, to be sent to [facility] [phone] - [place], [place] AT [address]  She is also requesting a script for prednisone

## 2018-10-29 NOTE — Telephone Encounter (Addendum)
Checked drug registry. She last refilled rx adderall 08/13/18 #30. Last seen 07/25/18 as VV but does not have 5-6 month f/u scheduled yet. She last received 1G IV solumedrol 10/09/18.  I will call pt back to get more info on request/schedule f/u

## 2018-10-29 NOTE — Telephone Encounter (Signed)
Dr. Felecia Shelling- FYI. She is coming tomorrow for f/u

## 2018-10-30 ENCOUNTER — Ambulatory Visit (INDEPENDENT_AMBULATORY_CARE_PROVIDER_SITE_OTHER): Payer: Medicare Other | Admitting: Neurology

## 2018-10-30 ENCOUNTER — Encounter: Payer: Self-pay | Admitting: Neurology

## 2018-10-30 ENCOUNTER — Other Ambulatory Visit: Payer: Self-pay

## 2018-10-30 VITALS — BP 143/91 | HR 91 | Temp 97.3°F | Ht 66.0 in | Wt 225.5 lb

## 2018-10-30 DIAGNOSIS — G35 Multiple sclerosis: Secondary | ICD-10-CM

## 2018-10-30 DIAGNOSIS — N1330 Unspecified hydronephrosis: Secondary | ICD-10-CM | POA: Diagnosis not present

## 2018-10-30 DIAGNOSIS — M87051 Idiopathic aseptic necrosis of right femur: Secondary | ICD-10-CM | POA: Diagnosis not present

## 2018-10-30 DIAGNOSIS — R269 Unspecified abnormalities of gait and mobility: Secondary | ICD-10-CM

## 2018-10-30 DIAGNOSIS — R41840 Attention and concentration deficit: Secondary | ICD-10-CM

## 2018-10-30 DIAGNOSIS — N183 Chronic kidney disease, stage 3 unspecified: Secondary | ICD-10-CM

## 2018-10-30 DIAGNOSIS — M87052 Idiopathic aseptic necrosis of left femur: Secondary | ICD-10-CM

## 2018-10-30 DIAGNOSIS — Z79899 Other long term (current) drug therapy: Secondary | ICD-10-CM

## 2018-10-30 MED ORDER — METHYLPREDNISOLONE 4 MG PO TABS: ORAL_TABLET | ORAL | 0 refills | Status: DC

## 2018-10-30 MED ORDER — AMPHETAMINE-DEXTROAMPHET ER 20 MG PO CP24: 20.0000 mg | ORAL_CAPSULE | Freq: Every day | ORAL | 0 refills | Status: DC

## 2018-10-30 NOTE — Progress Notes (Signed)
[facility]  PATIENT: Dawn Peters DOB: 06/12/1980  REFERRING DOCTOR OR PCP:  Dr. Annitta Needs SOURCE: Patient, records in EMR, lab results and radiology reports in EMR, MRI images reviewed on PACS.  _________________________________   HISTORICAL  CHIEF COMPLAINT:  Chief Complaint  Patient presents with   Follow-up    RM 12 with (temp: 97.8). Last received IV solu-medrol 1G 10/09/2018. Here for left leg weakness. Ambulating with rolling walker.    HISTORY OF PRESENT ILLNESS:   Dawn Peters is a 38 y.o. woman who was diagnosed with MS in 2000.     Update 10/30/2018: She switched to [place].    Her last dose was in May.   She has had several IV steroids followed by a prednisone pack and feels she gets the most benefit from that.    She was diagnosed with MS in 2000.   She had many courses of IV steroids a few years after diagnosis and had hip AVN requiring replacements.   She usually feels fatigue and other symptoms are better a couple weeks after every steroid dose.  We discussed the long-term consequences of too frequent steroid use.  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  She reports that she had cystoscopy performed recently that did not show any blockage  Update 07/25/2018 (virtual): She feels her MS has been stable and she denies any new exacerbations.  She had her initial dose of Ocrevus about a week ago and will have the second half of the dose next week.  The transition from [place] to [place] was delayed due to her being in a skilled nursing facility after her last hospital visit for further rehabilitation of her gait.  She does feel that her gait is doing better now than it did a couple months ago.  She has not had any recent falls.  Most difficulties are due to the left side being weak and spastic.  She has a left foot drop.  She feels dalfampridine has  helped her gait some.  Baclofen helps the spasticity and the addition of nighttime tizanidine has helped the more spasticity at night.  She continues to have urinary hesitancy but tamsulosin has helped.  She notes continued pain from the muscle spasticity those medications have helped.  She has fatigue.  Adderall has helped the fatigue some and also helps her focus better.  She is sleeping better at night now than she did previously.  She feels mood is doing okay though she has felt a little more down over the past few months.  In the past she was on citalopram and felt it helped.  I can get her back on that medication.  BuSpar has helped with the anxiety.  Her vitamin D has been low and was just represcribed high-dose supplementation.    Update 03/28/2018: She went to [facility] emergency room 03/13/2018 due to more left-sided symptoms.  While there, she had an MRI of the brain and cervical spine.  I looked at the actual MRI images and the reports.  She has stable periventricular white matter changes consistent with MS without evidence of disease progression when compared to her previous MRI.  There were no new spinal cord lesions noted.  Hydroureternephrosis on the left was again demonstrated (she has seen Dr. Louis Meckel about this in the past).  I was contacted by Dr. Erlinda Hong during that emergency room visit.  Although an  MS exacerbation was unlikely (she has multiple other emergency room visits for identical symptoms), in the past she has felt symptom relief with IV steroids so a 1000 mg dose was ordered x 3 days.    She feels her left leg is better but still weak and she is tripping more.     She started PT.   They are recommending a new brace that she can sleep in.    She is on baclofen 20 mg up to 4 times a day.    Ampyra helped her walking.     I personally reviewed notes, labs and imaging reports from her recent [facility] emergency room and hospital admission and personally reviewed the MRI images and  compare them to her previous scans  She would like ot switch off the Tecfidera.  In the past, we had discussed Ocrevus as another option.   She had her [place] courses in 2017 and 2018.  She has had some elevation of her creatinine.   This is likely related to her hydronephrosis on the left and she states she has 10% kidney function on that side and 40-50% on the other side.    Update 10/19/2017: She switched over to Tecfidera earlier this year.  She tolerates it well.     She is noting more weakness and spasticity in the left leg.  Balance is poor.  She needs to use a cane.  In the past, she has felt better when she received a couple days of IV steroids.  She notes a little bit more spasticity in the legs.   She takes baclofen and tizanidine 4 times a day and is unsure how much they are helping.     She is noting more leg pain.   She felt leg pain did better on gabapentin than on Lyrica.  She also notes more fatigue the last couple weeks.  She is sleeping well most nights.  She has some urinary frequency but feels this is stable.  She is still on Eliquis.  She had an Ultrasound to check up on the clot this morning.   Her migraine headaches are doing well  Update 09/20/2017: She had two cycles of Lemtrada (2017 and 2018) and switched to Tecfidera a few months ago as she was still having progression (has active SPMS) and  She has left sided leg weakness and spasticity and has reduced gait.     She uses a cane.     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     She has some fatigue but this is stable.    She sleeps well most nights.    Migraines are doing well.     Her creatinine has ben mildly elevated 1.3 to 1.4 but stable   Anti-GBM Ab was fine.  Update 06/21/2017: She has an active form of SPMS.  She re-presented with worsening left sided weakness and more trouble walking a few days after  her last visit.  Imaging studies did not show any new lesions.  She was given IV solu-medrol but did not feel she got any better.   She then was d/c to [facility] for Rehab.     Her last Lemtrada was 10/10/2016-10/12/16.      She did not follow up with nephrology appointment scheduled for March.  Creatinine has been increased.   During last admission, she was evaluated by medicine and received IV fluids and creatinine improved to 1.39-1.41.  One year ago creatinine was 0.9.      Patients with [name] can have anti-glomerular membrane antibodies and this was checked 05/17/2017 and was normal.   There also have been a few cases of other glomerulonephropathy seen in clinical studies and post-marketing data.     She is not on any NSAID's.   Lab and imaging reports, MRI images on PACS and admission discharge notes from her recent admission were reviewed  Update 05/17/2017:   She had a second course of Lemtrada in August 2018.   She notes her left leg is worse.  It is weaker and she is falling more.  She was in the hospital last month due to hypersomnia and generalized weakness. No definite etiology was determined. Drug screen was negative. Urinalysis showed small amount of hemoglobin but no protein or casts.   Repeat brain MRI showed no new lesions.   She received 3 days of IV Solu-Medrol.  Admission and discharge notes were reviewed.Her creatinine has been elevated with the last reading 1.52.    She is scheduled to see nephrology next week.  She also has apparent hydronephrosis on MRI and I referred her to urology last October but she does not recall going.   She is on Ampyra and feels it helps her strength some.     Update 12/28/2016:   She had her 3 day course of Lemtrada in August. She tolerated it well. She has not had any exacerbations while on Lemtrada for the MS. Her last MRI was in June 2018 and did not show any new lesions. She continues to have difficulty with her gait due to the left leg weakness and  spasticity. An AFO initially helped her walk but she is having trouble with fit that she believes is worse due to weight gain. Balance is also mildly off. Spasticity is predominantly in the left leg she gets some benefit with baclofen and tizanidine.    She has painful dysesthesias of the left leg helped partially by gabapentin and lamotrigine. She also sees pain management for opiates. She has worsening urinary hesitancy this year and an MRI of the thoracic spine showed hydronephrosis. She has fatigue is both physical and cognitive. It is worse in the heat so she feels it is doing better than a couple months ago. Well most nights. Mood has done better with citalopram and BuSpar.  ' From 10/24/2016: MS:   She had her 3 day course of Lemtrada in the office a couple weeks ago and thee infusions went well. . She is JCV high titer positive and had switched from Tysabri to [place] earlier.     Gait/strength:    Gait has worsened due mostly to the left leg weakness and spasticity over the last year. She got her left AFO and is starting to get used to it.   She has trouble lifting the left leg/foot.  Ba;lance is off.     She is on Ampyra with some benefit.  Arms are doing ok.  She has left sided spasticity, only partially helped by baclofen and tizanidine.  Dysesthesia:   Patient continues to report painful left leg dysesthesias. These are similar to the last visit.  .  She is currently on gabapentin 800 mg by mouth 4 times a day and lamotrigine 200 mg twice a day for the dysesthetic pain.  With  her medications and tramadol she feels that the pain is tolerable.   Vision:    She denies any MS related visual problems.  Bladder:   Urinary hesitancy continues to be a problem. She gets some benefit from tamsulosin. MRI of the thoracic spine with recent hospitalization shows hydronephrosis on the left.  She has not yet been referred to urology.  Bowel is fine..  Fatigue/sleep:   She is noting more trouble with her  fatigue, no occurring daily.  . She does especially poorly in the hear.  She denies sleepiness -- issues are more fatigue.   She has never tried a stimulant.   She sleeps well most nights.  Mood:  Her depression and anxiety is better with citalopram 40 mg and buspar bid.    Anxiety is more of a problem than depression and she feels that the medications are not completely controlling this.  Cognition:   She denies significant difficulties with her cognitive abilities.   Focus is mildly reduced.  MS history:   She was diagnosed with MS in 2000 after presenting with gait difficulties, numbness and headaches. An MRI of the brain was consistent with MS. She was then started on Betaseron. She had difficulty tolerating Betaseron and at some point switched to Novantrone. She took Novantrone every 3 months for a year or so. A little later, she was placed on Tysabri and she stayed on it for a couple of years. However, she was JCV-positive and switched off. She did well on Tysabri. She had been on Tecfidera since 2015. Her MRI from June 2015 showed that there had been no changes compared to an MRI from 2014.   However, she had an exacerbation March 2017.   The 08/07/2013 MRI of the brain shows multiple T2/FLAIR hyperintense foci located in the cerebellum, middle cerebellar peduncles, pons and in the periventricular white matter of the hemispheres in a pattern and configuration consistent with chronic demyelinating plaque associated with MS. There were no acute findings. There was no change compared to the previous MRI from 01/16/2013 that was also reviewed. An MRI of the cervical spine shows subtle T2 hyperintense signal within the cord and there were no acute findings.  REVIEW OF SYSTEMS: Constitutional: No fevers, chills, sweats, or change in appetite.   She has fatigue.  Some insomnia. Eyes: No visual changes, double vision, eye pain Ear, nose and throat: No hearing loss, ear pain, nasal congestion, sore  throat Cardiovascular: No chest pain, palpitations Respiratory: No shortness of breath at rest or with exertion.   No wheezes GastrointestinaI: No nausea, vomiting, diarrhea, abdominal pain, fecal incontinence Genitourinary: No dysuria.   She has hesitancy. Sometimes she has difficulty emptying completely Flomax has helped urinary hesitancy. 1 x nocturia. Musculoskeletal:She notes pain in the left leg and some back pain Integumentary: No rash, pruritus, skin lesions Neurological: as above Psychiatric: Mild depression at this time.  Moderate anxiety Endocrine: No palpitations, diaphoresis, change in appetite, change in weigh or increased thirst Hematologic/Lymphatic: No anemia, purpura, petechiae. Allergic/Immunologic: No itchy/runny eyes, nasal congestion, recent allergic reactions, rashes  ALLERGIES: Allergies  Allergen Reactions   Ace Inhibitors Swelling   Amoxicillin Itching    Did it involve swelling of the face/tongue/throat, SOB, or low BP? Yes Did it involve sudden or severe rash/hives, skin peeling, or any reaction on the inside of your mouth or nose? No Did you need to seek medical attention at a hospital or doctor's office? No When did it last happen?unknown  If all  above answers are NO, may proceed with cephalosporin use.;   Celexa [Citalopram] Swelling    mouth   Lisinopril Swelling    Lower lip swelling    HOME MEDICATIONS:  Current Outpatient Medications:    amantadine (SYMMETREL) 100 MG capsule, Take 100 mg by mouth 2 (two) times daily., Disp: , Rfl: 2   amitriptyline (ELAVIL) 25 MG tablet, TAKE 1-2 TABLETS BY MOUTH AT BEDTIME (Patient taking differently: Take 50 mg by mouth at bedtime. ), Disp: 180 tablet, Rfl: 0   amLODipine (NORVASC) 5 MG tablet, Take 1 tablet (5 mg total) by mouth daily. (Patient taking differently: Take 5 mg by mouth daily after breakfast. ), Disp: 90 tablet, Rfl: 0   amphetamine-dextroamphetamine (ADDERALL XR) 20 MG 24 hr  capsule, Take 1 capsule (20 mg total) by mouth daily., Disp: 30 capsule, Rfl: 0   apixaban (ELIQUIS) 5 MG TABS tablet, Take 1 tablet (5 mg total) by mouth 2 (two) times daily., Disp: 60 tablet, Rfl: 0   baclofen (LIORESAL) 20 MG tablet, Take 1 tablet (20 mg total) by mouth 4 (four) times daily., Disp: 360 tablet, Rfl: 3   busPIRone (BUSPAR) 15 MG tablet, Take 1 tablet (15 mg total) by mouth 2 (two) times daily., Disp: 60 tablet, Rfl: 11   citalopram (CELEXA) 20 MG tablet, Take 1 tablet (20 mg total) by mouth daily., Disp: 30 tablet, Rfl: 11   dalfampridine 10 MG TB12, Take 1 tablet (10 mg total) by mouth 2 (two) times daily., Disp: 180 tablet, Rfl: 3   gabapentin (NEURONTIN) 800 MG tablet, Take 1 tablet (800 mg total) by mouth 4 (four) times daily., Disp: 120 tablet, Rfl: 11   lamoTRIgine (LAMICTAL) 200 MG tablet, Take 1 tablet (200 mg total) by mouth 2 (two) times daily., Disp: 60 tablet, Rfl: 11   metoprolol succinate (TOPROL XL) 25 MG 24 hr tablet, Take 1 tablet (25 mg total) by mouth daily., Disp: 30 tablet, Rfl: 5   Multiple Vitamin (MULTIVITAMIN WITH MINERALS) TABS tablet, Take 1 tablet by mouth daily., Disp: 30 tablet, Rfl: 0   Oxcarbazepine (TRILEPTAL) 300 MG tablet, Take 1 tablet (300 mg total) by mouth 2 (two) times daily., Disp: 60 tablet, Rfl: 1   oxyCODONE-acetaminophen (PERCOCET) 10-325 MG tablet, Take 1 tablet by mouth every 4 (four) hours., Disp: , Rfl:    polyethylene glycol (MIRALAX / GLYCOLAX) packet, Take 17 g by mouth daily., Disp: 14 each, Rfl: 0   SUMAtriptan (IMITREX) 100 MG tablet, Take 1 tablet (100 mg total) by mouth once as needed for migraine. May repeat in 2 hours if headache persists or recurs., Disp: 10 tablet, Rfl: 3   tamsulosin (FLOMAX) 0.4 MG CAPS capsule, Take 1 capsule (0.4 mg total) by mouth daily., Disp: 30 capsule, Rfl: 11   tiZANidine (ZANAFLEX) 4 MG tablet, Take 1 tablet (4 mg total) by mouth at bedtime., Disp: 30 tablet, Rfl: 0    valACYclovir (VALTREX) 500 MG tablet, Take 1 tablet (500 mg total) by mouth 2 (two) times daily., Disp: 60 tablet, Rfl: 2  PAST MEDICAL HISTORY: Past Medical History:  Diagnosis Date   Anticoagulant long-term use    Eliquis   Anxiety    Chronic pain    CKD (chronic kidney disease), stage III (HCC)    Depression    DVT, lower extremity, 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   left lower extremity-- treated with eliquis   Gait disturbance    due to MS   GERD (gastroesophageal reflux disease)  watch diet   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/p  THA   History of DVT of lower extremity 2007   History of encephalopathy [id number]   acute metabolic encephalopathy secondary to MS,  resolved   History of MRSA infection 2004   right hip infection post THA   History of pulmonary embolus (PE) 2005   Hydronephrosis, left    w/ acute kidney injury 02/ 2019   Hypertension    Left-sided weakness    due to MS   Migraines    MS (multiple sclerosis) [facility]) neurologist-  dr Renne Musca   dx 2000--   Muscle spasticity    Neurogenic bladder    due to MS   Pulmonary embolism, bilateral ([place]) 09/17/2017   treated w/ eliquis   Strains to urinate    Urgency of urination     PAST SURGICAL HISTORY: Past Surgical History:  Procedure Laterality Date   CYSTOSCOPY WITH RETROGRADE PYELOGRAM, URETEROSCOPY AND STENT PLACEMENT Bilateral 11/29/2017   Procedure: BILATERAL  RETROGRADE PYELOGRAM, LEFT DIAGNOSIC URETEROSCOPY AND STENT LEFT PLACEMENT;  Surgeon: Ardis Hughs, MD;  Location: [facility];  Service: Urology;  Laterality: Bilateral;   HIP ARTHROSCOPY Left 07-05-2013   dr pill @[facility]    iliopsosas release, synovectomy   REVISION TOTAL HIP ARTHROPLASTY Right 03-28-2003    dr Alvan Dame @[facility]    TOTAL HIP ARTHROPLASTY Bilateral left 11-05-2002  dr Alvan Dame @[facility] ;   right 06/ 2004  @[facility]     FAMILY HISTORY: Family  History  Problem Relation Age of Onset   Healthy Mother    Healthy Father    Breast cancer Other        paternal grandmother dx age 12   Colon cancer Other        paternal grandmother   Hypertension Other    Diabetes Other     SOCIAL HISTORY:  Social History   Socioeconomic History   Marital status: Married    Spouse name: Not on file   Number of children: Not on file   Years of education: Not on file   Highest education level: Not on file  Occupational History   Not on file  Social Needs   Financial resource strain: Not on file   Food insecurity    Worry: Not on file    Inability: Not on file   Transportation needs    Medical: Not on file    Non-medical: Not on file  Tobacco Use   Smoking status: Never Smoker   Smokeless tobacco: Never Used  Substance and Sexual Activity   Alcohol use: No   Drug use: No   Sexual activity: Not Currently    Birth control/protection: Pill  Lifestyle   Physical activity    Days per week: Not on file    Minutes per session: Not on file   Stress: Not on file  Relationships   Social connections    Talks on phone: Not on file    Gets together: Not on file    Attends religious service: Not on file    Active member of club or organization: Not on file    Attends meetings of clubs or organizations: Not on file    Relationship status: Not on file   Intimate partner violence    Fear of current or ex partner: Not on file    Emotionally abused: Not on file    Physically abused: Not on file  Forced sexual activity: Not on file  Other Topics Concern   Not on file  Social History Narrative   Not on file     PHYSICAL EXAM  Vitals:   10/30/18 1145  BP: (!) 143/91  Pulse: 91  Temp: (!) 97.3 F (36.3 C)  Weight: 225 lb 8 oz (102.3 kg)  Height: 5\' 6"  (1.676 m)    Body mass index is 36.4 kg/m.   General: The patient is well-developed and well-nourished and in no acute distress.  Neurologic  Exam  Mental status: The patient is alert and oriented x 3 at the time of the examination. The patient has apparent normal recent and remote memory, with mildly reduced attention span and concentration ability.   Speech is normal.  Cranial nerves: Extraocular movements are full.  Facial strength and sensation is normal.  Trapezius strength is normal.. No obvious hearing deficits are noted.  Motor:  Muscle bulk is normal.  She has increased muscle tone in the left leg.  Strength is normal in the arms and right leg and 4/5 in the distal left leg and 4+/5 proximally.  Sensory: Touch and vibration sensation is normal in the arms and legs.  Coordination: Finger-nose-finger is performed well bilaterally.  Heel-to-shin is slightly reduced on the right and moderately reduced on the left.  Gait and station: Station is normal.  She can walk without a cane.  She has a left foot drop.  She walks faster with her walker..  She cannot tandem walk.  She walks better with a cane and without.  Reflexes: Deep tendon reflexes are increased in the legs, left greater than right.  She has crossed abductor responses, worse on the left.  She has nonsustained clonus in the left ankle    DIAGNOSTIC DATA (LABS, IMAGING, TESTING) - I reviewed patient records, labs, notes, testing and imaging myself where available.  Lab Results  Component Value Date   WBC 4.0 09/13/2018   HGB 13.5 09/13/2018   HCT 39.1 09/13/2018   MCV 91 09/13/2018   PLT 328 09/13/2018      Component Value Date/Time   NA 137 04/04/2018 0645   NA 144 06/21/2017 1626   K 4.1 04/04/2018 0645   CL 104 04/04/2018 0645   CO2 27 04/04/2018 0645   GLUCOSE 135 (H) 04/04/2018 0645   BUN 16 04/04/2018 0645   BUN 16 06/21/2017 1626   CREATININE 1.31 (H) 09/13/2018 1413   CREATININE 0.85 07/03/2014 0959   CALCIUM 9.1 04/04/2018 0645   PROT 7.5 04/03/2018 0035   PROT 7.5 01/11/2018 1542   ALBUMIN 3.9 04/03/2018 0035   ALBUMIN 4.2 01/11/2018 1542    AST 15 04/03/2018 0035   ALT 13 04/03/2018 0035   ALKPHOS 68 04/03/2018 0035   BILITOT 0.4 04/03/2018 0035   BILITOT 0.5 01/11/2018 1542   GFRNONAA 51 (L) 09/13/2018 1413   GFRNONAA 88 07/03/2014 0959   GFRAA 59 (L) 09/13/2018 1413   GFRAA >89 07/03/2014 0959   Lab Results  Component Value Date   CHOL 228 (H) 04/20/2017   HDL 77 04/20/2017   LDLCALC 142 (H) 04/20/2017   TRIG 46 04/20/2017   CHOLHDL 3.0 04/20/2017   Lab Results  Component Value Date   HGBA1C 5.3 04/20/2017   Lab Results  Component Value Date   VITAMINB12 228 03/15/2018   Lab Results  Component Value Date   TSH 0.765 09/13/2018      ASSESSMENT AND PLAN   No diagnosis found.  1.  Discussion about MS disease modifying therapies.  She is concerned that she did not feel better after the Ocrevus.  I discussed that the main purpose is to prevent future exacerbations.  I think it be reasonable for [place] to do a least 1 more cycle.  If he does not feel she is getting benefit compared to previous medications we may want to switch back or consider Zeposia or Mayzent. 2.    Steroid Medrol pack.  Renew Adderall. 3.    Stay active and exercise as tolerated .4.    We discussed safety during the COVID-19 pandemic, washing hands and socially distancing.  Strong immunomodulatory/immunosuppressive medications for MS could possibly increase risk. 5.    She will return to see me in 4 months or sooner if there are new or worsening neurologic symptoms.   Coleby Yett A. Felecia Shelling, MD, PhD [id number], [id number] AM Certified in Neurology, Clinical Neurophysiology, Sleep Medicine, Pain Medicine and Neuroimaging  [facility] [address] [place], [place] [address] [phone] ]

## 2018-11-08 ENCOUNTER — Telehealth: Payer: Self-pay | Admitting: Obstetrics & Gynecology

## 2018-11-08 NOTE — Telephone Encounter (Signed)
Spoke with Dawn Peters about needing medication until she can come in to her appointment.

## 2018-11-08 NOTE — Telephone Encounter (Signed)
Pt called in and requested her OCP's be refilled. She report she has not taken them since "sometime last year". Pt has upcoming appt on 10/19 for yearly exam and to discuss BCP.   Pt reports she feels like she needs to come in sooner as she would like to get her pap done, discussed that that is the first available appt. Pt had an appt scheduled in may and she reports she cancelled it.   Informed pt that I could not send in a refill on her prescription until she sees a provider as she has not been on it for a while. Spoke with Dr. Rip Harbour who agreed not to send in prescription until she has been seen by a provider.  Pt was upset that she cannot get in the office sooner. Sent message to [facility] to see if she can get in sooner.

## 2018-11-19 ENCOUNTER — Ambulatory Visit (INDEPENDENT_AMBULATORY_CARE_PROVIDER_SITE_OTHER): Payer: Medicare Other | Admitting: Family Medicine

## 2018-11-19 ENCOUNTER — Encounter: Payer: Self-pay | Admitting: Family Medicine

## 2018-11-19 ENCOUNTER — Other Ambulatory Visit (HOSPITAL_COMMUNITY)
Admission: RE | Admit: 2018-11-19 | Discharge: 2018-11-19 | Disposition: A | Discharge status: Home / Self Care | Payer: Medicare Other | Source: Ambulatory Visit | Attending: Family Medicine | Admitting: Family Medicine

## 2018-11-19 ENCOUNTER — Other Ambulatory Visit: Payer: Self-pay

## 2018-11-19 VITALS — BP 132/83 | HR 83 | Temp 98.9°F | Wt 225.0 lb

## 2018-11-19 DIAGNOSIS — Z124 Encounter for screening for malignant neoplasm of cervix: Secondary | ICD-10-CM | POA: Diagnosis present

## 2018-11-19 DIAGNOSIS — Z1151 Encounter for screening for human papillomavirus (HPV): Secondary | ICD-10-CM | POA: Insufficient documentation

## 2018-11-19 DIAGNOSIS — N912 Amenorrhea, unspecified: Secondary | ICD-10-CM

## 2018-11-19 DIAGNOSIS — Z01419 Encounter for gynecological examination (general) (routine) without abnormal findings: Secondary | ICD-10-CM | POA: Diagnosis not present

## 2018-11-19 DIAGNOSIS — Z309 Encounter for contraceptive management, unspecified: Secondary | ICD-10-CM

## 2018-11-19 LAB — POCT PREGNANCY, URINE: Preg Test, Ur: NEGATIVE

## 2018-11-19 MED ORDER — NORETHINDRONE 0.35 MG PO TABS: 1.0000 | ORAL_TABLET | Freq: Every day | ORAL | 11 refills | Status: AC

## 2018-11-19 NOTE — Progress Notes (Signed)
GYNECOLOGY OFFICE VISIT NOTE  History:   Dawn Peters is a 38 y.o. G1P1001 here today for annual well woman.   No period for the past 4 months Has not taken any birth control pills since last year Last unprotected intercourse 2 months prior, UPT negative today Last had regular periods early last year No nipple discharge Denies vaginal dryness but sometimes does get very hot    Past Medical History:  Diagnosis Date  . Anticoagulant long-term use    Eliquis  . Anxiety   . Chronic pain   . CKD (chronic kidney disease), stage III ([place])   . Depression   . 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 Gait disturbance    due to MS  . GERD (gastroesophageal reflux disease)    watch diet  . 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 History of DVT of lower extremity 2007  . History of encephalopathy [id number]   acute metabolic encephalopathy secondary to MS,  resolved  . History of MRSA infection 2004   right hip infection post THA  . History of pulmonary embolus (PE) 2005  . Hydronephrosis, left    w/ acute kidney injury 02/ 2019  . Hypertension   . Left-sided weakness    due to MS  . Migraines   . MS (multiple sclerosis) [facility]) neurologist-  dr Renne Musca   dx 2000--  . Muscle spasticity   . Neurogenic bladder    due to MS  . Pulmonary embolism, bilateral ([place]) 09/17/2017   treated w/ eliquis  . Strains to urinate   . Urgency of urination     Past Surgical History:  Procedure Laterality Date  . CYSTOSCOPY WITH RETROGRADE PYELOGRAM, URETEROSCOPY AND STENT PLACEMENT Bilateral 11/29/2017   Procedure: BILATERAL  RETROGRADE PYELOGRAM, LEFT DIAGNOSIC URETEROSCOPY AND STENT LEFT PLACEMENT;  Surgeon: Ardis Hughs, MD;  Location: [facility];  Service: Urology;  Laterality: Bilateral;  . HIP ARTHROSCOPY Left 07-05-2013   dr pill @[facility]     iliopsosas release, synovectomy  . REVISION TOTAL HIP ARTHROPLASTY Right 03-28-2003    dr Alvan Dame @[facility]   . TOTAL HIP ARTHROPLASTY Bilateral left 11-05-2002  dr Alvan Dame @[facility] ;   right 06/ 2004  @[facility]     The following portions of the patient's history were reviewed and updated as appropriate: allergies, current medications, past family history, past medical history, past social history, past surgical history and problem list.   Health Maintenance:  Normal pap and negative HRHPV on 12/2014.  Normal mammogram: n/a.   Review of Systems:  Pertinent items noted in HPI and remainder of comprehensive ROS otherwise negative.  Physical Exam:  BP 132/83   Pulse 83   Temp 98.9 F (37.2 C)   Wt 225 lb (102.1 kg)   LMP  (LMP Unknown)   BMI 36.32 kg/m  CONSTITUTIONAL: Well-developed, well-nourished female in no acute distress.  HEENT:  Normocephalic, atraumatic. External right and left ear normal. No scleral icterus.  NECK: Normal range of motion, supple, no masses noted on observation SKIN: No rash noted. Not diaphoretic. No erythema. No pallor. MUSCULOSKELETAL: Normal range of motion. No edema noted. NEUROLOGIC: Alert and oriented to person, place, and time. Normal muscle tone coordination. No cranial nerve deficit noted. PSYCHIATRIC: Normal mood and affect. Normal behavior. Normal judgment and thought content. CARDIOVASCULAR: Normal heart rate noted RESPIRATORY: Effort and breath  sounds normal, no problems with respiration noted ABDOMEN: No masses noted. No other overt distention noted.   PELVIC: Normal appearing external genitalia; normal appearing vaginal mucosa and cervix.  No abnormal discharge noted.  Normal uterine size, no other palpable masses, no uterine or adnexal tenderness.  Labs and Imaging Results for orders placed or performed in visit on 11/19/18 (from the past 168 hour(s))  Pregnancy, urine POC   Collection Time: 11/19/18  7:23 PM  Result Value Ref Range   Preg Test, Ur NEGATIVE  NEGATIVE   No results found.    Assessment and Plan:   Problem List Items Addressed This Visit      Other   Amenorrhea    Reports oligomenorrhea for >1 year and amenorrhea for past 4 months. No intercourse for two months and UPT negative today. Multiple possible etiologies. Obesity leading to unopposed estrogen and ovulatory dysfunction high on differential. Also on multiple AED meds which can interfere with HPA. No menopausal symptoms or signs of androgenization to suggest PCOS. Called patient after visit, will have her return for labs to evaluate etiology and definitively rule out items on ddx. Requested restarting Micronor as well and rx sent, if labs normal and no withdrawal bleed can do higher dose progestin challenge.       Relevant Orders   Pregnancy, urine POC (Completed)   Prolactin   FSH   TSH   Estradiol   Well woman exam - Primary    Moderately good health secondary to significant comorbidities Pap collected today No high risk family features, OK for breast CA screening at age 78 Would like to restart Micronor, refill sent, see separate problem Amenorrhea Discussed weight loss, healthy lifestyle       Other Visit Diagnoses    Screening for cervical cancer       Relevant Orders   Cytology - PAP( [place])   Encounter for contraceptive management, unspecified type       Relevant Medications   norethindrone (MICRONOR) 0.35 MG tablet      Routine preventative health maintenance measures emphasized. Please refer to After Visit Summary for other counseling recommendations.   Return for once lab results are available.     Augustin Coupe, [place] for [employer], [place]

## 2018-11-20 ENCOUNTER — Encounter: Payer: Self-pay | Admitting: Family Medicine

## 2018-11-20 DIAGNOSIS — N912 Amenorrhea, unspecified: Secondary | ICD-10-CM | POA: Insufficient documentation

## 2018-11-20 DIAGNOSIS — Z01419 Encounter for gynecological examination (general) (routine) without abnormal findings: Secondary | ICD-10-CM | POA: Insufficient documentation

## 2018-11-20 NOTE — Assessment & Plan Note (Signed)
Moderately good health secondary to significant comorbidities Pap collected today No high risk family features, OK for breast CA screening at age 38 Would like to restart Micronor, refill sent, see separate problem Amenorrhea Discussed weight loss, healthy lifestyle

## 2018-11-20 NOTE — Assessment & Plan Note (Signed)
Reports oligomenorrhea for >1 year and amenorrhea for past 4 months. No intercourse for two months and UPT negative today. Multiple possible etiologies. Obesity leading to unopposed estrogen and ovulatory dysfunction high on differential. Also on multiple AED meds which can interfere with HPA. No menopausal symptoms or signs of androgenization to suggest PCOS. Called patient after visit, will have her return for labs to evaluate etiology and definitively rule out items on ddx. Requested restarting Micronor as well and rx sent, if labs normal and no withdrawal bleed can do higher dose progestin challenge.

## 2018-11-21 ENCOUNTER — Other Ambulatory Visit: Payer: Self-pay | Admitting: Neurology

## 2018-11-23 LAB — CYTOLOGY - PAP
Adequacy: ABSENT
Diagnosis: NEGATIVE
HPV: NOT DETECTED

## 2018-11-26 ENCOUNTER — Telehealth: Payer: Self-pay

## 2018-11-26 NOTE — Telephone Encounter (Signed)

## 2018-11-27 ENCOUNTER — Institutional Professional Consult (permissible substitution): Payer: Medicare Other | Admitting: Plastic Surgery

## 2018-11-29 ENCOUNTER — Telehealth: Payer: Self-pay | Admitting: Neurology

## 2018-11-29 MED ORDER — AMITRIPTYLINE HCL 25 MG PO TABS: 25.0000 mg | ORAL_TABLET | Freq: Every day | ORAL | 1 refills | Status: DC

## 2018-11-29 NOTE — Telephone Encounter (Signed)
Pt called needing a refill on her amitriptyline (ELAVIL) 25 MG tablet sent to the [facility] on [address] Pt states her pharmacy informed her that it has been denied. Please advise.

## 2018-11-29 NOTE — Telephone Encounter (Signed)
Called pt to let her know refill sent. She verbalized understanding.

## 2018-11-29 NOTE — Telephone Encounter (Signed)
Spoke with Dr. Felecia Shelling. Okay to refill. I e-scribed to pharmacy for pt.

## 2018-12-07 ENCOUNTER — Telehealth: Payer: Self-pay | Admitting: Neurology

## 2018-12-10 NOTE — Telephone Encounter (Signed)
error 

## 2018-12-17 ENCOUNTER — Other Ambulatory Visit: Payer: Self-pay | Admitting: Neurology

## 2018-12-17 ENCOUNTER — Telehealth: Payer: Self-pay | Admitting: Neurology

## 2018-12-17 NOTE — Telephone Encounter (Signed)
I called pt and relayed information below. She will contact pharmacy, nothing further needed.

## 2018-12-17 NOTE — Telephone Encounter (Signed)
Reviewed pt chart. Refills were already sent 07/25/18 #60 with 11 refills. Should have refills available at the pharmacy

## 2018-12-17 NOTE — Telephone Encounter (Signed)
Pt called needing a refill on her busPIRone (BUSPAR) 15 MG tablet sent to the [facility] on [address]

## 2018-12-24 ENCOUNTER — Ambulatory Visit: Payer: Medicare Other | Admitting: Obstetrics & Gynecology

## 2018-12-27 ENCOUNTER — Other Ambulatory Visit: Payer: Self-pay | Admitting: Neurology

## 2018-12-27 ENCOUNTER — Telehealth: Payer: Self-pay | Admitting: *Deleted

## 2018-12-27 MED ORDER — PREDNISONE 50 MG PO TABS: ORAL_TABLET | ORAL | 0 refills | Status: DC

## 2018-12-27 NOTE — Telephone Encounter (Signed)
Took call from phone staff. She cannot lift left leg. This started about two weeks ago. She fell last night. She was trying to get up out of a chair. She denied hitting her head, no significant injuries.  No signs/sx of UTI. She has dental appt at 1pm today. Advised I will discuss with Dr. Felecia Shelling and call her back.   Spoke with Dr. Felecia Shelling who would like to do Solumedrol 1G IV x1 day. I called pt and she will come at 3:30pm.  [name], RN with infusion suite can fit her in then. I provided signed orders to [facility], Therapist, sports.

## 2019-01-02 ENCOUNTER — Telehealth: Payer: Self-pay | Admitting: *Deleted

## 2019-01-02 NOTE — Telephone Encounter (Signed)
Called pt to let her know she is past due for monthly Lemtrada labs. She will try to go before Friday. Advised we faxed orders to [facility]/[address] 09/19/18 that is good until 03/22/19. She verbalized understanding.

## 2019-01-15 ENCOUNTER — Telehealth: Payer: Self-pay | Admitting: Neurology

## 2019-01-15 ENCOUNTER — Encounter: Payer: Self-pay | Admitting: Neurology

## 2019-01-15 NOTE — Telephone Encounter (Signed)
Dr. Felecia Shelling- are you ok with providing this?

## 2019-01-15 NOTE — Telephone Encounter (Signed)
Patient called concerned that the state is attempting to declare that she is no longer disables and she is wanting to get a letter from Dr.Sader stating her health issues and that she is disabled. Please follow up.

## 2019-01-16 ENCOUNTER — Encounter: Payer: Self-pay | Admitting: Neurology

## 2019-01-16 NOTE — Telephone Encounter (Signed)
I called pt. She was in parking lot. I placed up front for pick up. She thinks she has kidney infection and has appt today to be evaluated for this.

## 2019-01-16 NOTE — Telephone Encounter (Signed)
I printed a letter.

## 2019-01-16 NOTE — Telephone Encounter (Addendum)
Pt has called asking that a letter be prepared stating that she is disabled and unable to work.  Pt states that her disability and her Social Security have been cut and she is unable to pay her bills. Pt was informed that the message from yesterday was sent to the RN.  Pt also was told that RN sent message to Dr Felecia Shelling and that she has to allow Dr Felecia Shelling time to respond.

## 2019-01-22 ENCOUNTER — Telehealth: Payer: Self-pay | Admitting: Neurology

## 2019-01-22 NOTE — Telephone Encounter (Signed)
Pt contacted the pt and advised I have not received her lemtrada labs as of yet.  Pt also wanted to know if we had received her notes from [facility]?  I advised we had not received the referral notes but I would have our medical records dept call and obtain them.

## 2019-01-22 NOTE — Telephone Encounter (Signed)
Pt medical records notes and notes from [facility] @ the front desk for p/u. Ptt will p/u soon.

## 2019-01-22 NOTE — Telephone Encounter (Signed)
Pt called wanting to know if her lab results have come in. Please advise. 

## 2019-01-29 ENCOUNTER — Ambulatory Visit: Payer: Medicaid Other | Admitting: Physical Therapy

## 2019-01-29 ENCOUNTER — Institutional Professional Consult (permissible substitution): Payer: Medicare Other | Admitting: Plastic Surgery

## 2019-02-13 ENCOUNTER — Other Ambulatory Visit: Payer: Self-pay

## 2019-02-13 ENCOUNTER — Encounter: Payer: Self-pay | Admitting: Plastic Surgery

## 2019-02-13 ENCOUNTER — Ambulatory Visit (INDEPENDENT_AMBULATORY_CARE_PROVIDER_SITE_OTHER): Payer: Medicaid Other | Admitting: Plastic Surgery

## 2019-02-13 VITALS — BP 138/85 | HR 119 | Temp 97.1°F | Ht 66.0 in | Wt 237.2 lb

## 2019-02-13 DIAGNOSIS — N62 Hypertrophy of breast: Secondary | ICD-10-CM | POA: Diagnosis not present

## 2019-02-13 NOTE — Progress Notes (Signed)
Referring Provider [name], PA-C [place] ,  [place] [address]   CC:  Chief Complaint  Patient presents with  . Advice Only    for (B) breast reduction      Dawn Peters is an 38 y.o. female.  HPI: Patient presents to discuss breast reduction.  She has had years of back pain neck pain and shoulder grooving that has not been relieved by physical therapy and Tylenol.  She reports sweating and mild skin irritation in the inframammary crease.  She has had a mammogram this year which was normal.  She denies any previous breast procedures.  She is somewhat unsure of her current breast size but wears at least a triple D bra.  She has had DVTs and pulmonary emboli that occurred after an MS flare that left her in a nursing home for a while.  She is on Eliquis and expects to be on it for the foreseeable future.  Allergies  Allergen Reactions  . Ace Inhibitors Swelling  . Amoxicillin Itching    Did it involve swelling of the face/tongue/throat, SOB, or low BP? Yes Did it involve sudden or severe rash/hives, skin peeling, or any reaction on the inside of your mouth or nose? No Did you need to seek medical attention at a hospital or doctor's office? No When did it last happen?unknown  If all above answers are "NO", may proceed with cephalosporin use.;  . Celexa [Citalopram] Swelling    mouth  . Lisinopril Swelling    Lower lip swelling    Outpatient Encounter Medications as of 02/13/2019  Medication Sig Note  . amantadine (SYMMETREL) 100 MG capsule Take 100 mg by mouth 2 (two) times daily.   Marland Kitchen amitriptyline (ELAVIL) 25 MG tablet Take 1-2 tablets (25-50 mg total) by mouth at bedtime.   Marland Kitchen amLODipine (NORVASC) 5 MG tablet Take 1 tablet (5 mg total) by mouth daily. (Patient taking differently: Take 5 mg by mouth daily after breakfast. )   . amphetamine-dextroamphetamine (ADDERALL XR) 20 MG 24 hr capsule Take 1 capsule (20 mg total) by mouth daily.   Marland Kitchen  apixaban (ELIQUIS) 5 MG TABS tablet Take 1 tablet (5 mg total) by mouth 2 (two) times daily.   . baclofen (LIORESAL) 20 MG tablet Take 1 tablet (20 mg total) by mouth 4 (four) times daily.   . busPIRone (BUSPAR) 15 MG tablet Take 1 tablet (15 mg total) by mouth 2 (two) times daily.   . citalopram (CELEXA) 20 MG tablet Take 1 tablet (20 mg total) by mouth daily.   Marland Kitchen dalfampridine 10 MG TB12 Take 1 tablet (10 mg total) by mouth 2 (two) times daily.   Marland Kitchen gabapentin (NEURONTIN) 800 MG tablet Take 1 tablet (800 mg total) by mouth 4 (four) times daily.   Marland Kitchen lamoTRIgine (LAMICTAL) 200 MG tablet Take 1 tablet (200 mg total) by mouth 2 (two) times daily.   . methylPREDNISolone (MEDROL) 4 MG tablet Taper from 6 pills po for one day to 1 pill po the last day over 6 days   . metoprolol succinate (TOPROL XL) 25 MG 24 hr tablet Take 1 tablet (25 mg total) by mouth daily.   . Multiple Vitamin (MULTIVITAMIN WITH MINERALS) TABS tablet Take 1 tablet by mouth daily. 11/24/2017: .  . norethindrone (MICRONOR) 0.35 MG tablet Take 1 tablet (0.35 mg total) by mouth daily.   . Oxcarbazepine (TRILEPTAL) 300 MG tablet Take 1 tablet (300 mg total) by mouth 2 (two) times  daily.   . oxyCODONE-acetaminophen (PERCOCET) 10-325 MG tablet Take 1 tablet by mouth every 4 (four) hours. 04/03/2018: Scheduled   . polyethylene glycol (MIRALAX / GLYCOLAX) packet Take 17 g by mouth daily.   . predniSONE (DELTASONE) 50 MG tablet Take 10 pills po qd x 2 days for MS exacerbation   . SUMAtriptan (IMITREX) 100 MG tablet Take 1 tablet (100 mg total) by mouth once as needed for migraine. May repeat in 2 hours if headache persists or recurs.   . tamsulosin (FLOMAX) 0.4 MG CAPS capsule Take 1 capsule (0.4 mg total) by mouth daily.   Marland Kitchen tiZANidine (ZANAFLEX) 4 MG tablet TAKE 1 TABLET EVERY NIGHT AT BEDTIME   . valACYclovir (VALTREX) 500 MG tablet Take 1 tablet (500 mg total) by mouth 2 (two) times daily.    No facility-administered encounter medications  on file as of 02/13/2019.      Past Medical History:  Diagnosis Date  . Anticoagulant long-term use    Eliquis  . Anxiety   . Chronic pain   . CKD (chronic kidney disease), stage III   . Depression   . 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 Gait disturbance    due to MS  . GERD (gastroesophageal reflux disease)    watch diet  . 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 History of DVT of lower extremity 2007  . History of encephalopathy [id number]   acute metabolic encephalopathy secondary to MS,  resolved  . History of MRSA infection 2004   right hip infection post THA  . History of pulmonary embolus (PE) 2005  . Hydronephrosis, left    w/ acute kidney injury 02/ 2019  . Hypertension   . Left-sided weakness    due to MS  . Migraines   . MS (multiple sclerosis) [facility]) neurologist-  dr Renne Musca   dx 2000--  . Muscle spasticity   . Neurogenic bladder    due to MS  . Pulmonary embolism, bilateral ([place]) 09/17/2017   treated w/ eliquis  . Strains to urinate   . Urgency of urination     Past Surgical History:  Procedure Laterality Date  . CYSTOSCOPY WITH RETROGRADE PYELOGRAM, URETEROSCOPY AND STENT PLACEMENT Bilateral 11/29/2017   Procedure: BILATERAL  RETROGRADE PYELOGRAM, LEFT DIAGNOSIC URETEROSCOPY AND STENT LEFT PLACEMENT;  Surgeon: Ardis Hughs, MD;  Location: [facility];  Service: Urology;  Laterality: Bilateral;  . HIP ARTHROSCOPY Left 07-05-2013   dr pill @[facility]    iliopsosas release, synovectomy  . REVISION TOTAL HIP ARTHROPLASTY Right 03-28-2003    dr Alvan Dame @[facility]   . TOTAL HIP ARTHROPLASTY Bilateral left 11-05-2002  dr Alvan Dame @[facility] ;   right 06/ 2004  @[facility]     Family History  Problem Relation Age of Onset  . Healthy Mother   . Healthy Father   . Breast cancer Other        paternal grandmother dx age 65  . Colon cancer Other         paternal grandmother  . Hypertension Other   . Diabetes Other     Social History   Social History Narrative  . Not on file     Review of Systems General: Denies fevers, chills, weight loss CV: Denies chest pain, shortness of breath, palpitations   Physical Exam Vitals with BMI 02/13/2019 11/19/2018 10/30/2018  Height 5\' 6"  -  5\' 6"   Weight 237 lbs 3 oz 225 lbs 225 lbs 8 oz  BMI 38.3 Q000111Q [id number]  Systolic 0000000 Q000111Q [id number]  Diastolic 85 83 91  Pulse 123456 83 91    General:  No acute distress,  Alert and oriented, Non-Toxic, Normal speech and affect Breast: She has grade 3 ptosis.  Sternal notch to nipple is 40 on both sides.  Nipple to fold is 20 on both sides.  She has no obvious scars or masses.  Assessment/Plan The patient has bilateral symptomatic macromastia.  She is a good candidate for a breast reduction.  The details of breast reduction surgery were discussed.  I explained the procedure in detail along the with the expected scars.  The risks were discussed in detail and include bleeding, infection, damage to surrounding structures, need for additional procedures, nipple loss, change in nipple sensation, persistent pain, contour irregularities and asymmetries.  I explained that breast feeding is often not possible after breast reduction surgery.  We discussed the expected postoperative course with an overall recovery period of about 1 month.  She demonstrated full understanding of all risks.  We discussed her personal risk factors that include being on blood thinners.  I am asking her to see a hematologist ahead of time as I do not believe she is ever had a formal work-up for the PEs.  Additionally they could offer recommendations for bridging the Eliquis to make sure they were doing it properly.  She would need to be off Eliquis for the breast reduction surgery at least temporarily.  Due to the size of her breasts and the other risk factors I recommended going straight to a free  nipple graft for her.  I explained both the pedicled and free nipple graft reduction techniques and the pros and cons of each.  Her bleeding risk coupled with the long length of which ever pedicle is used make the risk profile for the free nipple graft more favorable.  I did make it very clear to her that the nipple would always be different color and the areola would be a different color when everything healed.  Additionally there would be little to no sensation in the grafted nipple.  She is fully understanding of this and agrees with the thought process.  I anticipate approximately 900 g of tissue removed from each side.  Cindra Presume 02/13/2019, 11:09 AM

## 2019-02-14 ENCOUNTER — Other Ambulatory Visit: Payer: Self-pay | Admitting: Neurology

## 2019-02-14 ENCOUNTER — Institutional Professional Consult (permissible substitution): Payer: Medicaid Other | Admitting: Plastic Surgery

## 2019-02-18 ENCOUNTER — Telehealth: Payer: Self-pay | Admitting: Neurology

## 2019-02-18 NOTE — Telephone Encounter (Signed)
.  1) Medication(s) Requested (by name): adderall 2) [phone] [place] [place] - [address] [place] pharmacy of Choice:  3) Special Requests:   Patient called stating she has left side pain and would like to know if she could get the steroid IV again.  Please follow up

## 2019-02-18 NOTE — Telephone Encounter (Signed)
Checked drug registry. She last refilled adderall 12/26/18 #30. Last seen 10/30/2018. Has no f/u scheduled. Dr. Felecia Shelling wanted her to have a 4 month f/u after that which would be around 03/01/2019.   I called pt to get a little more info on left sided pain.  She reports having left sided weakness, pain. Started 3-4 days ago. [place] a few times. Having to use walker. She is having a court  Hearing soon and wanting [place] to request records from [facility] for her to obtain. Otherwise, she would have to pay.   She will also address refill request for adderall tomorrow at visit with MD.  I checked and pt last lemtrada monthly labs we have on file is 01/15/2019. She is due for repeat labs.

## 2019-02-19 ENCOUNTER — Other Ambulatory Visit: Payer: Self-pay

## 2019-02-19 ENCOUNTER — Ambulatory Visit (INDEPENDENT_AMBULATORY_CARE_PROVIDER_SITE_OTHER): Payer: Medicare Other | Admitting: Neurology

## 2019-02-19 ENCOUNTER — Ambulatory Visit: Payer: Self-pay | Admitting: Neurology

## 2019-02-19 ENCOUNTER — Encounter: Payer: Self-pay | Admitting: Neurology

## 2019-02-19 VITALS — BP 125/80 | HR 112 | Temp 97.4°F | Ht 66.0 in | Wt 241.0 lb

## 2019-02-19 DIAGNOSIS — N399 Disorder of urinary system, unspecified: Secondary | ICD-10-CM

## 2019-02-19 DIAGNOSIS — R29898 Other symptoms and signs involving the musculoskeletal system: Secondary | ICD-10-CM

## 2019-02-19 DIAGNOSIS — G35 Multiple sclerosis: Secondary | ICD-10-CM

## 2019-02-19 DIAGNOSIS — N1330 Unspecified hydronephrosis: Secondary | ICD-10-CM

## 2019-02-19 DIAGNOSIS — Z79899 Other long term (current) drug therapy: Secondary | ICD-10-CM

## 2019-02-19 DIAGNOSIS — R269 Unspecified abnormalities of gait and mobility: Secondary | ICD-10-CM

## 2019-02-19 DIAGNOSIS — R41840 Attention and concentration deficit: Secondary | ICD-10-CM

## 2019-02-19 MED ORDER — AMPHETAMINE-DEXTROAMPHET ER 20 MG PO CP24: 20.0000 mg | ORAL_CAPSULE | Freq: Every day | ORAL | 0 refills | Status: DC

## 2019-02-19 MED ORDER — CITALOPRAM HYDROBROMIDE 20 MG PO TABS: 20.0000 mg | ORAL_TABLET | Freq: Every day | ORAL | 11 refills | Status: DC

## 2019-02-19 MED ORDER — SUMATRIPTAN SUCCINATE 100 MG PO TABS: 100.0000 mg | ORAL_TABLET | Freq: Once | ORAL | 3 refills | Status: DC | PRN

## 2019-02-19 NOTE — Telephone Encounter (Signed)
Called pt per infusion suite request. Pt spoke with Liane thinking she had IV infusion scheduled and expressed dissatisfaction and stated she was scheduled for one.  I went over our conversation again from yesterday and pt verbalized understanding and in agreement of what we spoke about. She states she is aware of missing appt this morning at 8am with  Dr. Felecia Shelling. She was too weak and could not make appt. I scheduled her for another appt this afternoon with Dr. Felecia Shelling at 230pm and asked that she check in by 2/215pm. She verbalized understanding.

## 2019-02-19 NOTE — Progress Notes (Signed)
Gave signed orders to infusion suite for IV solumedrol 1Gx1day. Sent copy to be scanned to epic.  Faxed printed/signed rx sumatriptan 100mg  tab to pharmacy at [phone]. Received fax confirmation.

## 2019-02-19 NOTE — Progress Notes (Addendum)
[facility]  PATIENT: Dawn Peters DOB: Mar 04, 1981  REFERRING DOCTOR OR PCP:  Dr. Annitta Needs SOURCE: Patient, records in EMR, lab results and radiology reports in EMR, MRI images reviewed on PACS.  _________________________________   HISTORICAL  CHIEF COMPLAINT:  Chief Complaint  Patient presents with  . Follow-up    RM 13, alone. Here for possible MS exacerbation. In [facility] in office today.     HISTORY OF PRESENT ILLNESS:   Dawn Peters is a 38 y.o. woman who was diagnosed with MS in 2000.   Mobility evaluation  Update 02/19/2019: Mobility evaluation:  Dawn Peters is a 38 year old woman with progressive left greater than right leg weakness and spasticity and worsening ability to ambulate due to her MS.   She also has fatigue.  Mobility needs:  She has difficulty performing activities of daily living such as going from room to room within her house to use the bathroom, meal preparation or light chores.  She is unable to use a cane due to the extent of her weakness and her fatigue.  She had been using a walker but that is no longer meeting her mobility needs due to weakness, spasticity and falls.  Therefore, she needs a standard wheelchair.   Arms are doing well and she can self-propel,   MS:    She is on Ocrevus but her last infusion was 7 months ago (May) .   We had previously discussed switching from Ocrevus to [place] or [place] to see if that would alter her slope of decline more than the Ocrevus.  Her last steroid dose was a high dose oral taper.    The left leg has been weaker the past few days and she has fallen.     She has had progression with more left leg weakness the past few years.     She sees Nephrologist is at [facility] and reports stable kidney function.   She is on tamsulosin and oxybutynin.   She is going to see urology again soon.      Her fatigue is helped by Adderall.   She is sleeping well most nights.  Mood is doing better  with the citalopram and anxiety is better with BuSpar though she still notes some.  She is taking vitamin D for her deficiency.   MS history:   She was diagnosed with MS in 2000 after presenting with gait difficulties, numbness and headaches. An MRI of the brain was consistent with MS. She was then started on Betaseron. She had difficulty tolerating Betaseron and at some point switched to Novantrone. She took Novantrone every 3 months for a year or so. A little later, she was placed on Tysabri and she stayed on it for a couple of years. However, she was JCV-positive and switched off. She did well on Tysabri. She had been on Tecfidera since 2015. Her MRI from June 2015 showed that there had been no changes compared to an MRI from 2014.   However, she had an exacerbation March 2017.   The 08/07/2013 MRI of the brain shows multiple T2/FLAIR hyperintense foci located in the cerebellum, middle cerebellar peduncles, pons and in the periventricular white matter of the hemispheres in a pattern and configuration consistent with chronic demyelinating plaque associated with MS. There were no acute findings. There was no change compared to the previous MRI from 01/16/2013 that was also reviewed. An MRI of the cervical spine shows subtle T2 hyperintense signal within the cord and there  were no acute findings.  MRI of the brain, cervical spine and thoracic spine 03/13/2018 multiple MS plaques without any acute lesions.  REVIEW OF SYSTEMS: Constitutional: No fevers, chills, sweats, or change in appetite.   She has fatigue.  Some insomnia. Eyes: No visual changes, double vision, eye pain Ear, nose and throat: No hearing loss, ear pain, nasal congestion, sore throat Cardiovascular: No chest pain, palpitations Respiratory: No shortness of breath at rest or with exertion.   No wheezes GastrointestinaI: No nausea, vomiting, diarrhea, abdominal pain, fecal incontinence Genitourinary: No dysuria.   She has hesitancy.  Sometimes she has difficulty emptying completely Flomax has helped urinary hesitancy. 1 x nocturia. Musculoskeletal:She notes pain in the left leg and some back pain Integumentary: No rash, pruritus, skin lesions Neurological: as above Psychiatric: Mild depression at this time.  Moderate anxiety Endocrine: No palpitations, diaphoresis, change in appetite, change in weigh or increased thirst Hematologic/Lymphatic: No anemia, purpura, petechiae. Allergic/Immunologic: No itchy/runny eyes, nasal congestion, recent allergic reactions, rashes  ALLERGIES: Allergies  Allergen Reactions  . Ace Inhibitors Swelling  . Amoxicillin Itching    Did it involve swelling of the face/tongue/throat, SOB, or low BP? Yes Did it involve sudden or severe rash/hives, skin peeling, or any reaction on the inside of your mouth or nose? No Did you need to seek medical attention at a hospital or doctor's office? No When did it last happen?unknown  If all above answers are "NO", may proceed with cephalosporin use.;  . Celexa [Citalopram] Swelling    mouth  . Lisinopril Swelling    Lower lip swelling    HOME MEDICATIONS:  Current Outpatient Medications:  .  amantadine (SYMMETREL) 100 MG capsule, Take 100 mg by mouth 2 (two) times daily., Disp: , Rfl: 2 .  amitriptyline (ELAVIL) 25 MG tablet, Take 1-2 tablets (25-50 mg total) by mouth at bedtime., Disp: 180 tablet, Rfl: 1 .  amLODipine (NORVASC) 5 MG tablet, Take 1 tablet (5 mg total) by mouth daily. (Patient taking differently: Take 5 mg by mouth daily after breakfast. ), Disp: 90 tablet, Rfl: 0 .  amphetamine-dextroamphetamine (ADDERALL XR) 20 MG 24 hr capsule, Take 1 capsule (20 mg total) by mouth daily., Disp: 30 capsule, Rfl: 0 .  apixaban (ELIQUIS) 5 MG TABS tablet, Take 1 tablet (5 mg total) by mouth 2 (two) times daily., Disp: 60 tablet, Rfl: 0 .  baclofen (LIORESAL) 20 MG tablet, Take 1 tablet (20 mg total) by mouth 4 (four) times daily., Disp: 360  tablet, Rfl: 3 .  busPIRone (BUSPAR) 15 MG tablet, Take 1 tablet (15 mg total) by mouth 2 (two) times daily., Disp: 60 tablet, Rfl: 11 .  citalopram (CELEXA) 20 MG tablet, Take 1 tablet (20 mg total) by mouth daily., Disp: 30 tablet, Rfl: 11 .  dalfampridine 10 MG TB12, Take 1 tablet (10 mg total) by mouth 2 (two) times daily., Disp: 180 tablet, Rfl: 3 .  gabapentin (NEURONTIN) 800 MG tablet, Take 1 tablet (800 mg total) by mouth 4 (four) times daily., Disp: 120 tablet, Rfl: 11 .  lamoTRIgine (LAMICTAL) 200 MG tablet, Take 1 tablet (200 mg total) by mouth 2 (two) times daily., Disp: 60 tablet, Rfl: 11 .  methylPREDNISolone (MEDROL) 4 MG tablet, Taper from 6 pills po for one day to 1 pill po the last day over 6 days, Disp: 21 tablet, Rfl: 0 .  metoprolol succinate (TOPROL XL) 25 MG 24 hr tablet, Take 1 tablet (25 mg total) by mouth daily., Disp: 30  tablet, Rfl: 5 .  Multiple Vitamin (MULTIVITAMIN WITH MINERALS) TABS tablet, Take 1 tablet by mouth daily., Disp: 30 tablet, Rfl: 0 .  norethindrone (MICRONOR) 0.35 MG tablet, Take 1 tablet (0.35 mg total) by mouth daily., Disp: 1 Package, Rfl: 11 .  Oxcarbazepine (TRILEPTAL) 300 MG tablet, Take 1 tablet (300 mg total) by mouth 2 (two) times daily., Disp: 60 tablet, Rfl: 1 .  oxyCODONE-acetaminophen (PERCOCET) 10-325 MG tablet, Take 1 tablet by mouth every 4 (four) hours., Disp: , Rfl:  .  polyethylene glycol (MIRALAX / GLYCOLAX) packet, Take 17 g by mouth daily., Disp: 14 each, Rfl: 0 .  predniSONE (DELTASONE) 50 MG tablet, Take 10 pills po qd x 2 days for MS exacerbation, Disp: 20 tablet, Rfl: 0 .  SUMAtriptan (IMITREX) 100 MG tablet, Take 1 tablet (100 mg total) by mouth once as needed for up to 1 dose for migraine. May repeat in 2 hours if headache persists or recurs., Disp: 10 tablet, Rfl: 3 .  tamsulosin (FLOMAX) 0.4 MG CAPS capsule, Take 1 capsule (0.4 mg total) by mouth daily., Disp: 30 capsule, Rfl: 11 .  tiZANidine (ZANAFLEX) 4 MG tablet, TAKE 1  TABLET EVERY NIGHT AT BEDTIME, Disp: 30 tablet, Rfl: 2 .  valACYclovir (VALTREX) 500 MG tablet, Take 1 tablet (500 mg total) by mouth 2 (two) times daily., Disp: 60 tablet, Rfl: 2  PAST MEDICAL HISTORY: Past Medical History:  Diagnosis Date  . Anticoagulant long-term use    Eliquis  . Anxiety   . Chronic pain   . CKD (chronic kidney disease), stage III   . Depression   . 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 Gait disturbance    due to MS  . GERD (gastroesophageal reflux disease)    watch diet  . 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 History of DVT of lower extremity 2007  . History of encephalopathy [id number]   acute metabolic encephalopathy secondary to MS,  resolved  . History of MRSA infection 2004   right hip infection post THA  . History of pulmonary embolus (PE) 2005  . Hydronephrosis, left    w/ acute kidney injury 02/ 2019  . Hypertension   . Left-sided weakness    due to MS  . Migraines   . MS (multiple sclerosis) [facility]) neurologist-  dr Renne Musca   dx 2000--  . Muscle spasticity   . Neurogenic bladder    due to MS  . Pulmonary embolism, bilateral ([place]) 09/17/2017   treated w/ eliquis  . Strains to urinate   . Urgency of urination     PAST SURGICAL HISTORY: Past Surgical History:  Procedure Laterality Date  . CYSTOSCOPY WITH RETROGRADE PYELOGRAM, URETEROSCOPY AND STENT PLACEMENT Bilateral 11/29/2017   Procedure: BILATERAL  RETROGRADE PYELOGRAM, LEFT DIAGNOSIC URETEROSCOPY AND STENT LEFT PLACEMENT;  Surgeon: Ardis Hughs, MD;  Location: [facility];  Service: Urology;  Laterality: Bilateral;  . HIP ARTHROSCOPY Left 07-05-2013   dr pill @[facility]    iliopsosas release, synovectomy  . REVISION TOTAL HIP ARTHROPLASTY Right 03-28-2003    dr Alvan Dame @[facility]   . TOTAL HIP ARTHROPLASTY Bilateral left 11-05-2002  dr Alvan Dame @[facility] ;   right  06/ 2004  @[facility]     FAMILY HISTORY: Family History  Problem Relation Age of Onset  . Healthy Mother   . Healthy Father   . Breast  cancer Other        paternal grandmother dx age 67  . Colon cancer Other        paternal grandmother  . Hypertension Other   . Diabetes Other     SOCIAL HISTORY:  Social History   Socioeconomic History  . Marital status: Married    Spouse name: Not on file  . Number of children: Not on file  . Years of education: Not on file  . Highest education level: Not on file  Occupational History  . Not on file  Tobacco Use  . Smoking status: Never Smoker  . Smokeless tobacco: Never Used  Substance and Sexual Activity  . Alcohol use: No  . Drug use: No  . Sexual activity: Not Currently    Birth control/protection: Pill  Other Topics Concern  . Not on file  Social History Narrative  . Not on file   Social Determinants of Health   Financial Resource Strain:   . Difficulty of Paying Living Expenses: Not on file  Food Insecurity:   . Worried About Charity fundraiser in the Last Year: Not on file  . Ran Out of Food in the Last Year: Not on file  Transportation Needs:   . Lack of Transportation (Medical): Not on file  . Lack of Transportation (Non-Medical): Not on file  Physical Activity:   . Days of Exercise per Week: Not on file  . Minutes of Exercise per Session: Not on file  Stress:   . Feeling of Stress : Not on file  Social Connections:   . Frequency of Communication with Friends and Family: Not on file  . Frequency of Social Gatherings with Friends and Family: Not on file  . Attends Religious Services: Not on file  . Active Member of Clubs or Organizations: Not on file  . Attends Archivist Meetings: Not on file  . Marital Status: Not on file  Intimate Partner Violence:   . Fear of Current or Ex-Partner: Not on file  . Emotionally Abused: Not on file  . Physically Abused: Not on file  . Sexually Abused: Not on file      PHYSICAL EXAM  Vitals:   02/19/19 1425  BP: 125/80  Pulse: (!) 112  Temp: (!) 97.4 F (36.3 C)  SpO2: 96%  Weight: 241 lb (109.3 kg)  Height: 5\' 6"  (1.676 m)    Body mass index is 38.9 kg/m.   General: The patient is well-developed and well-nourished and in no acute distress.  Neurologic Exam  Mental status: The patient is alert and oriented x 3 at the time of the examination. The patient has apparent normal recent and remote memory, with mildly reduced attention span and concentration ability.   Speech is normal.  Cranial nerves: Extraocular movements are full.  Facial strength and sensation is normal.  Trapezius strength is normal.. No obvious hearing deficits are noted.  Motor:  Muscle bulk is normal.  She has increased muscle tone in the left leg.  Strength is normal in the arms and right leg and 4/5 in the distal left leg and 4+/5 proximally.  Sensory: Touch and vibration sensation is normal in the arms and legs.  Coordination: Finger-nose-finger is performed well bilaterally.  She has difficulty doing heel-to-shin on the left due to the weakness and spasticity and it is slightly reduced on the right.  Gait and station: Station is normal.  She needs unilateral support to take some steps due to a left  foot drop..  She cannot tandem walk.    Reflexes: Deep tendon reflexes are increased in the legs, left greater than right.  She has crossed abductor responses, worse on the left.  She has nonsustained clonus in the left ankle    DIAGNOSTIC DATA (LABS, IMAGING, TESTING) - I reviewed patient records, labs, notes, testing and imaging myself where available.  Lab Results  Component Value Date   WBC 4.0 09/13/2018   HGB 13.5 09/13/2018   HCT 39.1 09/13/2018   MCV 91 09/13/2018   PLT 328 09/13/2018      Component Value Date/Time   NA 137 04/04/2018 0645   NA 144 06/21/2017 1626   K 4.1 04/04/2018 0645   CL 104 04/04/2018 0645   CO2 27 04/04/2018 0645    GLUCOSE 135 (H) 04/04/2018 0645   BUN 16 04/04/2018 0645   BUN 16 06/21/2017 1626   CREATININE 1.31 (H) 09/13/2018 1413   CREATININE 0.85 07/03/2014 0959   CALCIUM 9.1 04/04/2018 0645   PROT 7.5 04/03/2018 0035   PROT 7.5 01/11/2018 1542   ALBUMIN 3.9 04/03/2018 0035   ALBUMIN 4.2 01/11/2018 1542   AST 15 04/03/2018 0035   ALT 13 04/03/2018 0035   ALKPHOS 68 04/03/2018 0035   BILITOT 0.4 04/03/2018 0035   BILITOT 0.5 01/11/2018 1542   GFRNONAA 51 (L) 09/13/2018 1413   GFRNONAA 88 07/03/2014 0959   GFRAA 59 (L) 09/13/2018 1413   GFRAA >89 07/03/2014 0959   Lab Results  Component Value Date   CHOL 228 (H) 04/20/2017   HDL 77 04/20/2017   LDLCALC 142 (H) 04/20/2017   TRIG 46 04/20/2017   CHOLHDL 3.0 04/20/2017   Lab Results  Component Value Date   HGBA1C 5.3 04/20/2017   Lab Results  Component Value Date   VITAMINB12 228 03/15/2018   Lab Results  Component Value Date   TSH 0.765 09/13/2018      ASSESSMENT AND PLAN    1. Multiple sclerosis ([place])   2. High risk medication use   3. Left leg weakness   4. Urinary disorder   5. Hydronephrosis of left kidney   6. Gait disturbance   7. Attention deficit    1.   Standard wheelchair to help meet her mobility needs.   She is able to safely self-propel.   2.    As she feels her scope of decline is unchanged on Ocrevus she would prefer to change to a different disease modifying therapy.  I will check some blood work and she signed a service request form for Zeposia  3.    1 g IV Solu-Medrol today.Marland Kitchen  Renew Adderall. .4.    We discussed safety during the COVID-19 pandemic, washing hands and socially distancing.  Strong immunomodulatory/immunosuppressive medications for MS could possibly increase risk. 5.    She will return to see me in 4 months or sooner if there are new or worsening neurologic symptoms.   Solaris Kram A. Felecia Shelling, MD, PhD [id number], [id number] PM Certified in Neurology, Clinical Neurophysiology, Sleep Medicine, Pain  Medicine and Neuroimaging  [facility] [address], [place], [place] [address] [phone] ]

## 2019-02-20 ENCOUNTER — Other Ambulatory Visit: Payer: Self-pay | Admitting: *Deleted

## 2019-02-20 ENCOUNTER — Telehealth: Payer: Self-pay | Admitting: *Deleted

## 2019-02-20 DIAGNOSIS — G35 Multiple sclerosis: Secondary | ICD-10-CM

## 2019-02-20 DIAGNOSIS — R269 Unspecified abnormalities of gait and mobility: Secondary | ICD-10-CM

## 2019-02-20 DIAGNOSIS — R29898 Other symptoms and signs involving the musculoskeletal system: Secondary | ICD-10-CM

## 2019-02-20 LAB — CBC WITH DIFFERENTIAL/PLATELET
Basophils Absolute: 0.1 10*3/uL (ref 0.0–0.2)
Basos: 0 %
EOS (ABSOLUTE): 0 10*3/uL (ref 0.0–0.4)
Eos: 0 %
Hematocrit: 40 % (ref 34.0–46.6)
Hemoglobin: 13.8 g/dL (ref 11.1–15.9)
Immature Grans (Abs): 0.3 10*3/uL — ABNORMAL HIGH (ref 0.0–0.1)
Immature Granulocytes: 3 %
Lymphocytes Absolute: 0.9 10*3/uL (ref 0.7–3.1)
Lymphs: 8 %
MCH: 32.2 pg (ref 26.6–33.0)
MCHC: 34.5 g/dL (ref 31.5–35.7)
MCV: 93 fL (ref 79–97)
Monocytes Absolute: 0.8 10*3/uL (ref 0.1–0.9)
Monocytes: 7 %
Neutrophils Absolute: 9 10*3/uL — ABNORMAL HIGH (ref 1.4–7.0)
Neutrophils: 82 %
Platelets: 332 10*3/uL (ref 150–450)
RBC: 4.29 x10E6/uL (ref 3.77–5.28)
RDW: 13.9 % (ref 11.7–15.4)
WBC: 11.1 10*3/uL — ABNORMAL HIGH (ref 3.4–10.8)

## 2019-02-20 LAB — URINALYSIS, ROUTINE W REFLEX MICROSCOPIC
Bilirubin, UA: NEGATIVE
Glucose, UA: NEGATIVE
Ketones, UA: NEGATIVE
Leukocytes,UA: NEGATIVE
Nitrite, UA: NEGATIVE
Protein,UA: NEGATIVE
RBC, UA: NEGATIVE
Specific Gravity, UA: 1.018 (ref 1.005–1.030)
Urobilinogen, Ur: 0.2 mg/dL (ref 0.2–1.0)
pH, UA: 6 (ref 5.0–7.5)

## 2019-02-20 LAB — TSH: TSH: 1.14 u[IU]/mL (ref 0.450–4.500)

## 2019-02-20 LAB — CREATININE, SERUM
Creatinine, Ser: 1.27 mg/dL — ABNORMAL HIGH (ref 0.57–1.00)
GFR calc Af Amer: 61 mL/min/{1.73_m2} (ref >59)
GFR calc non Af Amer: 53 mL/min/{1.73_m2} — ABNORMAL LOW (ref >59)

## 2019-02-20 NOTE — Telephone Encounter (Signed)
Faxed order for new WC to [facility] at [phone]. Received fax confirmation.  Phone: [phone].  I called pt, someone picked up but then they ended up, did not say anything. I tried calling back but went to VM and VM full. I requested SMS notification be sent for her to call back. Please relay to her that order for new wheelchair faxed to [place] and provide her their phone number to call to f/u on this.

## 2019-02-21 LAB — SPECIMEN STATUS REPORT

## 2019-02-22 LAB — VARICELLA ZOSTER ANTIBODY, IGG: Varicella zoster IgG: 2583 index (ref >165)

## 2019-02-22 LAB — SPECIMEN STATUS REPORT

## 2019-02-22 LAB — COMPREHENSIVE METABOLIC PANEL

## 2019-03-05 ENCOUNTER — Telehealth: Payer: Self-pay | Admitting: *Deleted

## 2019-03-05 NOTE — Telephone Encounter (Signed)
Faxed completed/signed start form for Zeposia to [employer] at [phone]. Received fax confirmation.

## 2019-03-06 ENCOUNTER — Encounter: Payer: Self-pay | Admitting: *Deleted

## 2019-03-06 ENCOUNTER — Other Ambulatory Visit: Payer: Self-pay | Admitting: Neurology

## 2019-03-06 NOTE — Telephone Encounter (Addendum)
We received a notice from [place] stating the pt's insurance had terminated 01/05/2019 and they need updated insurance info. I called the pt. She stated she received her disability check and her benefits (medicare part A&B dual complete) should be reinstated but she doesn't have the information yet. She does still have medicaid. The patient will call [employer] with [employer] when she gets the new insurance information. The pt did not have a pen at the time of the call so she is aware a mycharrt message will be sent. I also faxed the update back to [employer] 360 support. Received a receipt of confirmation.  [employer]  Phone: [phone] Fax: [phone]

## 2019-03-12 NOTE — Telephone Encounter (Signed)
Called pt to f/u after [facility] sent mychart message. Instructed her to call [employer] support back at [phone] to update them about her insurance situation. She will call today. She states she LVM for medicaid case worker and waiting on [place] to go through.

## 2019-03-12 NOTE — Telephone Encounter (Signed)
I called [employer] support and spoke with [facility]. She states nurse placed note that pt does not have EKG on file that was done in the last 6 months. I relayed she had a normal EKG done in 2019. She will have nurse call back to clarify if EKG is needed. If so, they can do in home EKG.   They also stated pt called and provided new insurance info and they are doing new benefit investigation with new information that was provided today.

## 2019-03-12 NOTE — Telephone Encounter (Signed)
Patient called to advise she was notified she will need a EKG for her zeposia  and a representative with zeposia will be calling [facility] to verify insurance coverage   Please follow up

## 2019-03-12 NOTE — Telephone Encounter (Signed)
Spoke with Dr. Felecia Shelling. Pt had normal EKG in 2019. If she needs another, we can refer her to cardiology.  Pt will need to provide updated insurance info to [place] [employer] support as we only have her old insurance on file.

## 2019-03-13 NOTE — Telephone Encounter (Signed)
Called pt back. Relayed below info. She verbalized understanding and will wait from call from nurse in the morning. She has her # in case she does not get a call.

## 2019-03-13 NOTE — Telephone Encounter (Signed)
Patient is requesting a call back.

## 2019-03-13 NOTE — Telephone Encounter (Signed)
Called and spoke with Josh with [employer]. Advised I was waiting on return call from nurse to see if EKG was needed. He states nurse tried reaching me but was on hold for extended period of time. She placed note that no EKG needed and will reach of to pt on 03/14/19 in the morning to f/u with her.

## 2019-03-20 NOTE — Telephone Encounter (Signed)
Called [employer] support to check on status of getting pt started on medication. Spoke with Cicly. States nurse spoke with pt, they had to do another benefit investigation but her insurance is stills showing as inactive. They have call scheduled for nurse to contact pt again today to f/u on insurance info. They will send [place] update after they speak with the pt.

## 2019-03-29 ENCOUNTER — Other Ambulatory Visit: Payer: Self-pay | Admitting: Neurology

## 2019-03-31 ENCOUNTER — Other Ambulatory Visit: Payer: Self-pay | Admitting: Neurology

## 2019-04-01 NOTE — Telephone Encounter (Signed)
Patient called in and stated that she still hasnt received the Zeposia , she is requesting a call back to discuss

## 2019-04-01 NOTE — Telephone Encounter (Signed)
I called [employer] support to get an update. Spoke with [place] They tried reaching pt on 03/20/19 and 03/27/19 because their second insurance verification is still showing inactive insurance. They could not leave message for pt d/t full VM. They have another call scheduled tomorrow to reach pt.  Pt can call her back directly at [phone] ext. [phone]. They are open Monday-Friday 8am-8pm.  I called pt but it went straight to VM and VM full. Could not leave message. I sent SMS notification to pt. If she calls, please let her know her insurance is still showing inactive. She needs to call [employer] support to provide update on insurance. They have been trying to reach her.

## 2019-04-02 ENCOUNTER — Encounter: Payer: Self-pay | Admitting: *Deleted

## 2019-04-02 ENCOUNTER — Telehealth: Payer: Self-pay | Admitting: Neurology

## 2019-04-02 NOTE — Telephone Encounter (Signed)
Pt called wanting to know if she can get a letter for SS stating that she is still disabled. Please advise.

## 2019-04-02 NOTE — Telephone Encounter (Signed)
Called pt. Advised letter ready for pick up. She will come now to get letter.

## 2019-04-02 NOTE — Telephone Encounter (Signed)
Pt called back earlier and spoke with Jael in phone room. Jael relayed that pt needed to contact [employer] with updated insurance info.  I called pt to let her know letter ready for pick up that she requested. She also stated she she just spoke with Vonna Kotyk with [name] support services. She provided her new Medicare card that she received and he verified insurance active and they will try to get medication shipped out to her ASAP. I asked that she call when she receives it. She verbalized understanding.

## 2019-04-02 NOTE — Telephone Encounter (Signed)
Letter printed, waiting on MD signature °

## 2019-04-03 ENCOUNTER — Telehealth: Payer: Self-pay | Admitting: *Deleted

## 2019-04-03 NOTE — Telephone Encounter (Addendum)
Called [facility] at [phone] and spoke with [place]. Submitted PA for Zeposia over the phone. Pt has tried/failed: betaseron, Tysabri, Novantrone, Tecfidera, Lemtrada, Ocrevus. Dx: [id number]. Case#: [id number]. We will have a determination in less than three days. Advised office closed on Fridays right now. She placed note on PA.

## 2019-04-08 ENCOUNTER — Other Ambulatory Visit: Payer: Self-pay | Admitting: Cardiology

## 2019-04-08 DIAGNOSIS — Z20822 Contact with and (suspected) exposure to covid-19: Secondary | ICD-10-CM

## 2019-04-08 NOTE — Telephone Encounter (Signed)
Received fax from [employer] that Zeposia starter kit approved for a non-formulary exception through 03/06/2020 under medicare part d benefit. [id number].

## 2019-04-09 LAB — NOVEL CORONAVIRUS, NAA: SARS-CoV-2, NAA: NOT DETECTED

## 2019-04-16 ENCOUNTER — Telehealth: Payer: Self-pay | Admitting: *Deleted

## 2019-04-16 NOTE — Telephone Encounter (Signed)
Called, LVM for pt to call office. I was wanting to know if she was able to get started on Zeposia?  I also wanted to remind her that she is past due for [facility] labs. The home draw lab program is back up and running for [place]. I have sent them news orders to get her re-enrolled. She can call them at [phone] to get scheduled. She needs to make sure sure to let them know she is a Event organiser patient so you are routed to the correct department.

## 2019-04-24 NOTE — Telephone Encounter (Signed)
Received fax from [facility] that they received rx for Zeposia starter pack. They are processing rx. I called and spoke with pt. She is in process of getting shipment set up for her Zeposia. She spoke with someone the other day.   I also reminded her that she still needs to complete monthly labs from being on Lemtrada in the past. I provided her [facility] phone number to call and get home draw set up. She is aware they have been trying to reach her to get scheduled.

## 2019-04-29 ENCOUNTER — Other Ambulatory Visit: Payer: Self-pay | Admitting: Neurology

## 2019-04-29 MED ORDER — AMPHETAMINE-DEXTROAMPHET ER 20 MG PO CP24: 20.0000 mg | ORAL_CAPSULE | Freq: Every day | ORAL | 0 refills | Status: DC

## 2019-04-29 NOTE — Telephone Encounter (Signed)
Tried checking drug registry. No refill showing for adderall. It was last prescribed 02/19/2019. Pt last seen 02/19/19 and next f/u 06/27/19. I called [facility] They are showing pt picked up rx 02/27/2019. Will send request to MD.   Pt should call [place] about getting rx Zeposia, they have been unable to reach her to fill the Zeposia starter pack. She needs to call them back at [phone]. I called and LVM for her to call them back. Asked her to call me back if she has any further questions.

## 2019-04-29 NOTE — Telephone Encounter (Signed)
1) Medication(s) Requested (by name): adderrall  2) Pharmacy of Choice: [facility] [address], [place]  [place], [place]   3) Special Requests:  States she has not received her zeposia medicaition as well

## 2019-05-01 NOTE — Telephone Encounter (Signed)
Called pt because we received another fax from [facility] that they have been unable to reach patient to schedule delivery of Zeposia. Asked that she call them back today at [phone]. They have placed everything on hold until she calls them.   She also has not completed monthly Lemtrada labs. She is aware she is past due and new orders sent to [facility]. She states she just moved and has had a lot going on. She will call them back asap at [phone]. Asked her to call me back if she runs into any issues. She verbalized understanding.

## 2019-05-03 ENCOUNTER — Telehealth: Payer: Self-pay | Admitting: Neurology

## 2019-05-03 DIAGNOSIS — R269 Unspecified abnormalities of gait and mobility: Secondary | ICD-10-CM

## 2019-05-03 DIAGNOSIS — G35 Multiple sclerosis: Secondary | ICD-10-CM

## 2019-05-03 NOTE — Telephone Encounter (Signed)
Patient called stating she was advised by the facility on "[address]" a new prescription needed to be sent over to them for her to get her wheelchair states with everything going on with covid she never got a chance to go pick it up. Please fu

## 2019-05-06 ENCOUNTER — Telehealth: Payer: Self-pay | Admitting: *Deleted

## 2019-05-06 NOTE — Telephone Encounter (Signed)
Waiting on MD signature and then will fax.

## 2019-05-06 NOTE — Telephone Encounter (Signed)
We can have her hold the amitriptyline and Celexa for 3 days before her first dose of the Zeposia and then she can restart 1 week later.

## 2019-05-06 NOTE — Telephone Encounter (Signed)
I called pt. Went over instructions below with her as well. She wrote this down and read back correctly. She will call back if any further questions/concerns.

## 2019-05-06 NOTE — Telephone Encounter (Signed)
Tried faxing to [facility] at [phone]. Failed all three times. I called and received alternate fax: [phone]. Received fax confirmation.

## 2019-05-06 NOTE — Addendum Note (Signed)
Addended by: Wyvonnia Lora on: 05/06/2019 12:04 PM   Modules accepted: Orders

## 2019-05-06 NOTE — Telephone Encounter (Signed)
Called and spoke with pharmacist, Mateo Flow with [facility]. Relayed Dr. Garth Bigness message. She made note in pt chart and aware I will also be calling the pt. The medication is set to ship out tomorrow.

## 2019-05-06 NOTE — Telephone Encounter (Signed)
Took call from phone staff and spoke with Will (pharmacist with [facility]). He reports there is a drug interaction with amitriptyline and zeposia. Her being on citalopram also increases her risk of QT prolongation and serotonin syndrome. They are wanting to know how to proceed. Normally it is recommended for someone to be off amitriptyline for 14 days prior to starting zeposia. They can be called back at [phone], ask to speak with pharmacist.

## 2019-05-06 NOTE — Telephone Encounter (Addendum)
I called pt back. She does not know name of place she wants orders sent to. Does not have phone #. She is ok for [place] to send orders to [facility]. I provided her their phone number.  She is requesting order for WC, walker and shower chair.

## 2019-05-07 NOTE — Telephone Encounter (Signed)
I addended  the note from December.

## 2019-05-07 NOTE — Telephone Encounter (Signed)
Received fax from [facility] that they are needing office notes showing medical necessity for wheelchair. Can you addened last office note to include this? I can then fax that in. Thank you!

## 2019-05-08 NOTE — Telephone Encounter (Signed)
Faxed office note to [facility] at [phone]. Received fax confirmation.

## 2019-05-09 NOTE — Telephone Encounter (Signed)
Received another request from [facility] that OV note be revised to include that pt can self propel WC or has caregiver who can assist at all times. Dr. Felecia Shelling addended note and I faxed back to them at [phone]. Received fax confirmation.

## 2019-05-14 ENCOUNTER — Encounter: Payer: Self-pay | Admitting: *Deleted

## 2019-05-29 ENCOUNTER — Telehealth: Payer: Self-pay | Admitting: Neurology

## 2019-05-29 NOTE — Telephone Encounter (Signed)
Noted  

## 2019-05-29 NOTE — Telephone Encounter (Signed)
Pt called wanting to schedule an apt to get updated labs drawn states she would like to check her levels

## 2019-05-29 NOTE — Telephone Encounter (Signed)
Called pt, unable to leave message. I sent SMS notification.  If she calls, please let her know she needs to call [facility] directly at [phone] to schedule labs for lemtrada monthly labs.

## 2019-05-29 NOTE — Telephone Encounter (Signed)
Pt called back and was in formed

## 2019-06-04 ENCOUNTER — Encounter: Payer: Self-pay | Admitting: Neurology

## 2019-06-04 ENCOUNTER — Telehealth: Payer: Self-pay | Admitting: Neurology

## 2019-06-04 NOTE — Telephone Encounter (Signed)
Pt called wanting to know if the provider thinks she should get he Covid Vaccine. Please advise.

## 2019-06-04 NOTE — Telephone Encounter (Signed)
Called pt back. Advised per Dr. Felecia Shelling that she can receive any of the three covid-19 vaccines available. She verbalized understanding.  I asked if she has had her monthly labs yet for [facility] monitoring. She states Melissa-nurse came out today. Lab results pending. Advised we will call her with any abnormalities. She verbalized understanding.

## 2019-06-05 ENCOUNTER — Telehealth: Payer: Self-pay | Admitting: Neurology

## 2019-06-05 NOTE — Telephone Encounter (Signed)
Pt has called for a refill on her SUMAtriptan (IMITREX) 100 MG tablet  [facility] [phone]

## 2019-06-05 NOTE — Telephone Encounter (Addendum)
Refill request too soon. Last refill sent 02/19/19 #10 with 3 refills (#10/30days).   I called pt. She never picked up this prescription. Did not know this was available. She will contact pharmacy for refill. Nothing further needed.

## 2019-06-12 NOTE — Telephone Encounter (Signed)
Called pt, advised recent monthly lemtrada labs looked okay per Dr. Felecia Shelling. She will set up next lab draw for the end of April. Nothing further needed.

## 2019-06-12 NOTE — Telephone Encounter (Signed)
Patient called to check on the status of her results

## 2019-06-24 ENCOUNTER — Telehealth: Payer: Self-pay | Admitting: Neurology

## 2019-06-24 ENCOUNTER — Other Ambulatory Visit: Payer: Self-pay | Admitting: *Deleted

## 2019-06-24 MED ORDER — DALFAMPRIDINE ER 10 MG PO TB12: 10.0000 mg | ORAL_TABLET | Freq: Two times a day (BID) | ORAL | 1 refills | Status: DC

## 2019-06-24 NOTE — Telephone Encounter (Signed)
Called pt back. She is wanting refill on dalfampridine, pharmacy told her they have no refills on file. She would like it sent to [facility]. I e-scribed rx. She has f/u 06/27/19 at 8:30am.

## 2019-06-24 NOTE — Telephone Encounter (Signed)
1) Medication(s) Requested (by name): impura 2) Pharmacy of Choice:  [facility] [place], [place] AT [address]  [place],

## 2019-06-24 NOTE — Progress Notes (Signed)
Called pt back. She is wanting refill on dalfampridine, pharmacy told her they have no refills on file. She would like it sent to [facility]. I e-scribed rx. She has f/u 06/27/19 at 8:30am.

## 2019-06-27 ENCOUNTER — Ambulatory Visit (INDEPENDENT_AMBULATORY_CARE_PROVIDER_SITE_OTHER): Payer: Medicare Other | Admitting: Neurology

## 2019-06-27 ENCOUNTER — Other Ambulatory Visit: Payer: Self-pay

## 2019-06-27 ENCOUNTER — Encounter: Payer: Self-pay | Admitting: Neurology

## 2019-06-27 ENCOUNTER — Ambulatory Visit: Payer: Medicare Other | Attending: Internal Medicine

## 2019-06-27 VITALS — BP 110/69 | HR 97 | Temp 97.3°F | Ht 66.0 in | Wt 239.0 lb

## 2019-06-27 DIAGNOSIS — Z79899 Other long term (current) drug therapy: Secondary | ICD-10-CM

## 2019-06-27 DIAGNOSIS — R269 Unspecified abnormalities of gait and mobility: Secondary | ICD-10-CM | POA: Diagnosis not present

## 2019-06-27 DIAGNOSIS — F418 Other specified anxiety disorders: Secondary | ICD-10-CM

## 2019-06-27 DIAGNOSIS — R41840 Attention and concentration deficit: Secondary | ICD-10-CM

## 2019-06-27 DIAGNOSIS — N399 Disorder of urinary system, unspecified: Secondary | ICD-10-CM

## 2019-06-27 DIAGNOSIS — G35 Multiple sclerosis: Secondary | ICD-10-CM | POA: Diagnosis not present

## 2019-06-27 DIAGNOSIS — Z23 Encounter for immunization: Secondary | ICD-10-CM

## 2019-06-27 DIAGNOSIS — N183 Chronic kidney disease, stage 3 unspecified: Secondary | ICD-10-CM

## 2019-06-27 MED ORDER — LAMOTRIGINE 200 MG PO TABS: 200.0000 mg | ORAL_TABLET | Freq: Two times a day (BID) | ORAL | 11 refills | Status: DC

## 2019-06-27 MED ORDER — TAMSULOSIN HCL 0.4 MG PO CAPS: 0.4000 mg | ORAL_CAPSULE | Freq: Every day | ORAL | 11 refills | Status: DC

## 2019-06-27 MED ORDER — GABAPENTIN 800 MG PO TABS: 800.0000 mg | ORAL_TABLET | Freq: Four times a day (QID) | ORAL | 11 refills | Status: DC

## 2019-06-27 MED ORDER — BUSPIRONE HCL 15 MG PO TABS: 15.0000 mg | ORAL_TABLET | Freq: Two times a day (BID) | ORAL | 11 refills | Status: DC

## 2019-06-27 MED ORDER — AMPHETAMINE-DEXTROAMPHET ER 20 MG PO CP24: 20.0000 mg | ORAL_CAPSULE | Freq: Every day | ORAL | 0 refills | Status: DC

## 2019-06-27 NOTE — Progress Notes (Signed)
[facility]  PATIENT: Dawn Peters DOB: 1980-04-27  REFERRING DOCTOR OR PCP:  Dr. Annitta Needs SOURCE: Patient, records in EMR, lab results and radiology reports in EMR, MRI images reviewed on PACS.  _________________________________   HISTORICAL  CHIEF COMPLAINT:  Chief Complaint  Patient presents with  . Follow-up    RM 12. On Zeposia, tolerating well. No new sx.She is scheduled to receive 1st covid-19 vaccine today at 1:30pm [employer]).    HISTORY OF PRESENT ILLNESS:  Dawn Peters is a 39 y.o. woman who was diagnosed with relapsing remitting MS in 2000.     Update 06/27/2019: She switched from [place] to [place] and is tolerating it well.   She continues to have mobility issues due to the left leg weakness and spasticity.   She has had progression with more left leg weakness the past few years but is stable compared to her last visit.  With a walker she can go       She has urinary urgency and hesitancy.  She is on tamsulosin and oxybutynin.   She is going to see urology again soon.      She sees Nephrologist is at [facility] and reports stable kidney function.    Her fatigue is helped by Adderall.   She is sleeping well most nights.  Depression and anxiety are better with the citalopram and anxiety is better with BuSpar though she still notes some.  She is taking vitamin D for her deficiency.   MS history:   She was diagnosed with MS in 2000 after presenting with gait difficulties, numbness and headaches. An MRI of the brain was consistent with MS. She was then started on Betaseron. She had difficulty tolerating Betaseron and at some point switched to Novantrone. She took Novantrone every 3 months for a year or so. A little later, she was placed on Tysabri and she stayed on it for a couple of years. However, she was JCV-positive and switched off. She did well on Tysabri. She had been on Tecfidera since 2015. Her MRI from June 2015 showed that there  had been no changes compared to an MRI from 2014.   However, she had an exacerbation March 2017.   The 08/07/2013 MRI of the brain shows multiple T2/FLAIR hyperintense foci located in the cerebellum, middle cerebellar peduncles, pons and in the periventricular white matter of the hemispheres in a pattern and configuration consistent with chronic demyelinating plaque associated with MS. There were no acute findings. There was no change compared to the previous MRI from 01/16/2013 that was also reviewed. She switched to [place] in July,2017/Aug 2018.   Due to progression she was shortly on Ocrevus in 2020 and then switched to Zeposia January 2021.  An MRI of the cervical spine shows subtle T2 hyperintense signal within the cord and there were no acute findings.  MRI of the brain, cervical spine and thoracic spine 03/13/2018 multiple MS plaques without any acute lesions.  REVIEW OF SYSTEMS: Constitutional: No fevers, chills, sweats, or change in appetite.   She has fatigue.  Some insomnia. Eyes: No visual changes, double vision, eye pain Ear, nose and throat: No hearing loss, ear pain, nasal congestion, sore throat Cardiovascular: No chest pain, palpitations Respiratory: No shortness of breath at rest or with exertion.   No wheezes GastrointestinaI: No nausea, vomiting, diarrhea, abdominal pain, fecal incontinence Genitourinary: No dysuria.   She has hesitancy. Sometimes she has difficulty emptying completely Flomax has helped urinary hesitancy. 1 x  nocturia. Musculoskeletal:She notes pain in the left leg and some back pain Integumentary: No rash, pruritus, skin lesions Neurological: as above Psychiatric: Mild depression at this time.  Moderate anxiety Endocrine: No palpitations, diaphoresis, change in appetite, change in weigh or increased thirst Hematologic/Lymphatic: No anemia, purpura, petechiae. Allergic/Immunologic: No itchy/runny eyes, nasal congestion, recent allergic reactions,  rashes  ALLERGIES: Allergies  Allergen Reactions  . Ace Inhibitors Swelling  . Amoxicillin Itching    Did it involve swelling of the face/tongue/throat, SOB, or low BP? Yes Did it involve sudden or severe rash/hives, skin peeling, or any reaction on the inside of your mouth or nose? No Did you need to seek medical attention at a hospital or doctor's office? No When did it last happen?unknown  If all above answers are "NO", may proceed with cephalosporin use.;  . Celexa [Citalopram] Swelling    mouth  . Lisinopril Swelling    Lower lip swelling    HOME MEDICATIONS:  Current Outpatient Medications:  .  amitriptyline (ELAVIL) 25 MG tablet, Take 1-2 tablets (25-50 mg total) by mouth at bedtime., Disp: 180 tablet, Rfl: 1 .  amLODipine (NORVASC) 5 MG tablet, Take 1 tablet (5 mg total) by mouth daily. (Patient taking differently: Take 5 mg by mouth daily after breakfast. ), Disp: 90 tablet, Rfl: 0 .  amphetamine-dextroamphetamine (ADDERALL XR) 20 MG 24 hr capsule, Take 1 capsule (20 mg total) by mouth daily., Disp: 30 capsule, Rfl: 0 .  apixaban (ELIQUIS) 5 MG TABS tablet, Take 1 tablet (5 mg total) by mouth 2 (two) times daily., Disp: 60 tablet, Rfl: 0 .  baclofen (LIORESAL) 20 MG tablet, Take 1 tablet (20 mg total) by mouth 4 (four) times daily., Disp: 360 tablet, Rfl: 3 .  busPIRone (BUSPAR) 15 MG tablet, Take 1 tablet (15 mg total) by mouth 2 (two) times daily., Disp: 60 tablet, Rfl: 11 .  citalopram (CELEXA) 20 MG tablet, Take 1 tablet (20 mg total) by mouth daily., Disp: 30 tablet, Rfl: 11 .  dalfampridine 10 MG TB12, Take 1 tablet (10 mg total) by mouth 2 (two) times daily., Disp: 180 tablet, Rfl: 1 .  gabapentin (NEURONTIN) 800 MG tablet, Take 1 tablet (800 mg total) by mouth 4 (four) times daily., Disp: 120 tablet, Rfl: 11 .  lamoTRIgine (LAMICTAL) 200 MG tablet, Take 1 tablet (200 mg total) by mouth 2 (two) times daily., Disp: 60 tablet, Rfl: 11 .  methylPREDNISolone (MEDROL)  4 MG tablet, Taper from 6 pills po for one day to 1 pill po the last day over 6 days, Disp: 21 tablet, Rfl: 0 .  metoprolol succinate (TOPROL-XL) 25 MG 24 hr tablet, TAKE 1 TABLET BY MOUTH DAILY, Disp: 30 tablet, Rfl: 5 .  Multiple Vitamin (MULTIVITAMIN WITH MINERALS) TABS tablet, Take 1 tablet by mouth daily., Disp: 30 tablet, Rfl: 0 .  norethindrone (MICRONOR) 0.35 MG tablet, Take 1 tablet (0.35 mg total) by mouth daily., Disp: 1 Package, Rfl: 11 .  Oxcarbazepine (TRILEPTAL) 300 MG tablet, Take 1 tablet (300 mg total) by mouth 2 (two) times daily., Disp: 60 tablet, Rfl: 1 .  oxyCODONE-acetaminophen (PERCOCET) 10-325 MG tablet, Take 1 tablet by mouth every 4 (four) hours., Disp: , Rfl:  .  Ozanimod HCl Starter Pack (ZEPOSIA STARTER KIT) 0.'23MG'$  & 0.'46MG'$  & 0.'92MG'$  CPPK, Take by mouth., Disp: , Rfl:  .  polyethylene glycol (MIRALAX / GLYCOLAX) packet, Take 17 g by mouth daily., Disp: 14 each, Rfl: 0 .  predniSONE (DELTASONE) 50 MG tablet, Take  10 pills po qd x 2 days for MS exacerbation, Disp: 20 tablet, Rfl: 0 .  SUMAtriptan (IMITREX) 100 MG tablet, Take 1 tablet (100 mg total) by mouth once as needed for up to 1 dose for migraine. May repeat in 2 hours if headache persists or recurs., Disp: 10 tablet, Rfl: 3 .  tamsulosin (FLOMAX) 0.4 MG CAPS capsule, Take 1 capsule (0.4 mg total) by mouth daily., Disp: 30 capsule, Rfl: 11 .  tiZANidine (ZANAFLEX) 4 MG tablet, TAKE 1 TABLET BY MOUTH EVERY NIGHT AT BEDTIME, Disp: 30 tablet, Rfl: 2 .  valACYclovir (VALTREX) 500 MG tablet, Take 1 tablet (500 mg total) by mouth 2 (two) times daily., Disp: 60 tablet, Rfl: 2  PAST MEDICAL HISTORY: Past Medical History:  Diagnosis Date  . Anticoagulant long-term use    Eliquis  . Anxiety   . Chronic pain   . CKD (chronic kidney disease), stage III   . Depression   . 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 Gait disturbance    due to MS  . GERD  (gastroesophageal reflux disease)    watch diet  . 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 History of DVT of lower extremity 2007  . History of encephalopathy 88/4166   acute metabolic encephalopathy secondary to MS,  resolved  . History of MRSA infection 2004   right hip infection post THA  . History of pulmonary embolus (PE) 2005  . Hydronephrosis, left    w/ acute kidney injury 02/ 2019  . Hypertension   . Left-sided weakness    due to MS  . Migraines   . MS (multiple sclerosis) [facility]) neurologist-  dr Renne Musca   dx 2000--  . Muscle spasticity   . Neurogenic bladder    due to MS  . Pulmonary embolism, bilateral ([place]) 09/17/2017   treated w/ eliquis  . Strains to urinate   . Urgency of urination     PAST SURGICAL HISTORY: Past Surgical History:  Procedure Laterality Date  . CYSTOSCOPY WITH RETROGRADE PYELOGRAM, URETEROSCOPY AND STENT PLACEMENT Bilateral 11/29/2017   Procedure: BILATERAL  RETROGRADE PYELOGRAM, LEFT DIAGNOSIC URETEROSCOPY AND STENT LEFT PLACEMENT;  Surgeon: Ardis Hughs, MD;  Location: [facility];  Service: Urology;  Laterality: Bilateral;  . HIP ARTHROSCOPY Left 07-05-2013   dr pill '@[facility]'$    iliopsosas release, synovectomy  . REVISION TOTAL HIP ARTHROPLASTY Right 03-28-2003    dr Alvan Dame '@[facility]'$   . TOTAL HIP ARTHROPLASTY Bilateral left 11-05-2002  dr Alvan Dame '@[facility]'$ ;   right 06/ 2004  '@[facility]'$     FAMILY HISTORY: Family History  Problem Relation Age of Onset  . Healthy Mother   . Healthy Father   . Breast cancer Other        paternal grandmother dx age 91  . Colon cancer Other        paternal grandmother  . Hypertension Other   . Diabetes Other     SOCIAL HISTORY:  Social History   Socioeconomic History  . Marital status: Married    Spouse name: Not on file  . Number of children: Not on file  . Years of education: Not on file  . Highest education level: Not on file   Occupational History  . Not on file  Tobacco Use  . Smoking status: Never Smoker  . Smokeless tobacco: Never Used  Substance and  Sexual Activity  . Alcohol use: No  . Drug use: No  . Sexual activity: Not Currently    Birth control/protection: Pill  Other Topics Concern  . Not on file  Social History Narrative  . Not on file   Social Determinants of Health   Financial Resource Strain:   . Difficulty of Paying Living Expenses:   Food Insecurity:   . Worried About Charity fundraiser in the Last Year:   . Arboriculturist in the Last Year:   Transportation Needs:   . Film/video editor (Medical):   Marland Kitchen Lack of Transportation (Non-Medical):   Physical Activity:   . Days of Exercise per Week:   . Minutes of Exercise per Session:   Stress:   . Feeling of Stress :   Social Connections:   . Frequency of Communication with Friends and Family:   . Frequency of Social Gatherings with Friends and Family:   . Attends Religious Services:   . Active Member of Clubs or Organizations:   . Attends Archivist Meetings:   Marland Kitchen Marital Status:   Intimate Partner Violence:   . Fear of Current or Ex-Partner:   . Emotionally Abused:   Marland Kitchen Physically Abused:   . Sexually Abused:      PHYSICAL EXAM  Vitals:   06/27/19 0859  BP: 110/69  Pulse: 97  Temp: (!) 97.3 F (36.3 C)  SpO2: 98%  Weight: 239 lb (108.4 kg)  Height: '5\' 6"'$  (1.676 m)    Body mass index is 38.58 kg/m.   General: The patient is well-developed and well-nourished and in no acute distress.  Neurologic Exam  Mental status: The patient is alert and oriented x 3 at the time of the examination. The patient has apparent normal recent and remote memory, with mildly reduced attention span and concentration ability.   Speech is normal.  Cranial nerves: Extraocular movements are full.  Facial strength and sensation is normal.  Trapezius strength is normal.. No obvious hearing deficits are noted.  Motor:  Muscle  bulk is normal.  She has increased muscle tone in the left leg.  Strength is normal in the arms and right leg and 3/5 left hip flexion,  4-/5 in the distal left leg and 4+/5 quads  Sensory: Touch and vibration sensation is normal in the arms and legs.  Coordination: She performs finger-nose-finger well.  Heel-to-shin is reduced on the right and she cannot do on the left.    Gait and station:She needs unilateral support to take some steps due to a left foot drop..  She cannot tandem walk.    Reflexes: Deep tendon reflexes are increased in the legs, left greater than right.  She has crossed abductor responses, worse on the left.  She has nonsustained clonus in the left ankle    DIAGNOSTIC DATA (LABS, IMAGING, TESTING) - I reviewed patient records, labs, notes, testing and imaging myself where available.  Lab Results  Component Value Date   WBC 11.1 (H) 02/19/2019   HGB 13.8 02/19/2019   HCT 40.0 02/19/2019   MCV 93 02/19/2019   PLT 332 02/19/2019      Component Value Date/Time   NA 137 04/04/2018 0645   NA 144 06/21/2017 1626   K 4.1 04/04/2018 0645   CL 104 04/04/2018 0645   CO2 27 04/04/2018 0645   GLUCOSE CANCELED 02/19/2019 1710   GLUCOSE 135 (H) 04/04/2018 0645   BUN 16 04/04/2018 0645   BUN 16 06/21/2017  1626   CREATININE 1.27 (H) 02/19/2019 1710   CREATININE 0.85 07/03/2014 0959   CALCIUM 9.1 04/04/2018 0645   PROT 7.5 04/03/2018 0035   PROT 7.5 01/11/2018 1542   ALBUMIN 3.9 04/03/2018 0035   ALBUMIN 4.2 01/11/2018 1542   AST 15 04/03/2018 0035   ALT 13 04/03/2018 0035   ALKPHOS 68 04/03/2018 0035   BILITOT 0.4 04/03/2018 0035   BILITOT 0.5 01/11/2018 1542   GFRNONAA 53 (L) 02/19/2019 1710   GFRNONAA 88 07/03/2014 0959   GFRAA 61 02/19/2019 1710   GFRAA >89 07/03/2014 0959   Lab Results  Component Value Date   CHOL 228 (H) 04/20/2017   HDL 77 04/20/2017   LDLCALC 142 (H) 04/20/2017   TRIG 46 04/20/2017   CHOLHDL 3.0 04/20/2017   Lab Results  Component  Value Date   HGBA1C 5.3 04/20/2017   Lab Results  Component Value Date   VITAMINB12 228 03/15/2018   Lab Results  Component Value Date   TSH 1.140 02/19/2019      ASSESSMENT AND PLAN    1. Multiple sclerosis ([place])   2. High risk medication use   3. Gait disturbance   4. Urinary disorder   5. Stage 3 chronic kidney disease, unspecified whether stage 3a or 3b CKD   6. Attention deficit   7. Depression with anxiety      1.   continue Zeposia  2.   Gabapentin, lamotrigine, buspar for dysesthesias and anxiety wer erefilled 3.    Renew Adderall. 4.    We discussed safety during the COVID-19 pandemic, washing hands and socially distancing. She will have vaccination this week. 5.    She will return to see me in 4 months or sooner if there are new or worsening neurologic symptoms.   Liddie Chichester A. Felecia Shelling, MD, PhD 3/90/3009, 23:30 AM Certified in Neurology, Clinical Neurophysiology, Sleep Medicine, Pain Medicine and Neuroimaging  [facility] [address] [place], [place] [address] [phone] ]

## 2019-06-27 NOTE — Progress Notes (Signed)
   Covid-19 Vaccination Clinic  Name:  Dawn Peters    MRN: [id number] DOB: 07-01-1980  06/27/2019  [name] was observed post Covid-19 immunization for 15 minutes without incident. She was provided with Vaccine Information Sheet and instruction to access the V-Safe system.   [name] was instructed to call 911 with any severe reactions post vaccine: Marland Kitchen Difficulty breathing  . Swelling of face and throat  . A fast heartbeat  . A bad rash all over body  . Dizziness and weakness   Immunizations Administered    Name Date Dose VIS Date Route   Pfizer COVID-19 Vaccine 06/27/2019  1:47 PM 0.3 mL 05/01/2018 Intramuscular   Manufacturer: [place]   Lot: [id number]   [place]: [id number]

## 2019-07-09 ENCOUNTER — Telehealth: Payer: Self-pay | Admitting: *Deleted

## 2019-07-09 NOTE — Telephone Encounter (Signed)
Called pt, unable to leave message. I sent SMS notification.  If she calls, please let her know she needs to call [facility] directly [phone] schedule labs for lemtrada monthly labs. She is past due for this.

## 2019-07-10 ENCOUNTER — Telehealth: Payer: Self-pay | Admitting: Neurology

## 2019-07-10 DIAGNOSIS — G35 Multiple sclerosis: Secondary | ICD-10-CM

## 2019-07-10 DIAGNOSIS — R29898 Other symptoms and signs involving the musculoskeletal system: Secondary | ICD-10-CM

## 2019-07-10 DIAGNOSIS — R531 Weakness: Secondary | ICD-10-CM

## 2019-07-10 NOTE — Telephone Encounter (Signed)
Spoke with pt. See phone note from 07-10-19

## 2019-07-10 NOTE — Telephone Encounter (Signed)
Called pt back. Left sided weakness started a couple weeks ago. Verified she is taking Zeposia still. She went to urgent care last week [facility]) and received 5 days of oral steroids. She did not know what mg/dose they gave her. States it only helped a little bit. Denies any other sx. No signs of infection.  Advised I will discuss with MD and call her back after. She verbalized understanding.

## 2019-07-10 NOTE — Telephone Encounter (Signed)
Called pt and relayed message. She is agreeable to plan. I placed orders for MRI's. She is aware someone will call her to get these scheduled.   I also relayed she needs to call[facility] directly [phone] schedule labs for lemtrada monthly labs.She is past due for this. She will call today.

## 2019-07-10 NOTE — Telephone Encounter (Signed)
Pt has called to report her left side is weak, she has been falling. She recently went to ED  And it was suggested that she may need a MRI.  Pt is asking for a call to have an IV steroid.  Please call

## 2019-07-10 NOTE — Telephone Encounter (Signed)
Spoke with Dr. Felecia Shelling. He does not recommend IV steroids. He would like to order MRI brain and cervical w wo since it has been over a year since she has had these done to check for any changes.

## 2019-07-10 NOTE — Addendum Note (Signed)
Addended by: Wyvonnia Lora on: 07/10/2019 04:39 PM   Modules accepted: Orders

## 2019-07-11 ENCOUNTER — Telehealth: Payer: Self-pay | Admitting: Neurology

## 2019-07-11 NOTE — Telephone Encounter (Signed)
[employer] medicare/medicaid order sent to [facility]. No auth they will reach out to the patient to schedule.  °

## 2019-07-15 ENCOUNTER — Telehealth: Payer: Self-pay | Admitting: *Deleted

## 2019-07-15 NOTE — Telephone Encounter (Signed)
Initiated PA Zeposia 0.92mg  on CMM. Key: [id number]. Waiting on notes to be attached to PA and then will submit as urgent.

## 2019-07-15 NOTE — Telephone Encounter (Signed)
Request Reference Number: [id number]. [place] CAP .92MG  is approved through 03/06/2020. Your patient may now fill this prescription and it will be covered.

## 2019-07-15 NOTE — Telephone Encounter (Signed)
Submitted PA on CMM. Waiting on determination from [employer] Part D.

## 2019-07-15 NOTE — Addendum Note (Signed)
Addended by: Roberts Gaudy L on: 07/15/2019 11:08 AM   Modules accepted: Orders

## 2019-07-18 DIAGNOSIS — Z7901 Long term (current) use of anticoagulants: Secondary | ICD-10-CM | POA: Diagnosis not present

## 2019-07-18 DIAGNOSIS — Z79899 Other long term (current) drug therapy: Secondary | ICD-10-CM | POA: Insufficient documentation

## 2019-07-18 DIAGNOSIS — Z96643 Presence of artificial hip joint, bilateral: Secondary | ICD-10-CM | POA: Insufficient documentation

## 2019-07-18 DIAGNOSIS — R12 Heartburn: Secondary | ICD-10-CM | POA: Insufficient documentation

## 2019-07-18 DIAGNOSIS — I129 Hypertensive chronic kidney disease with stage 1 through stage 4 chronic kidney disease, or unspecified chronic kidney disease: Secondary | ICD-10-CM | POA: Diagnosis not present

## 2019-07-18 DIAGNOSIS — N183 Chronic kidney disease, stage 3 unspecified: Secondary | ICD-10-CM | POA: Diagnosis not present

## 2019-07-18 DIAGNOSIS — G35 Multiple sclerosis: Secondary | ICD-10-CM | POA: Insufficient documentation

## 2019-07-18 DIAGNOSIS — R1013 Epigastric pain: Secondary | ICD-10-CM | POA: Diagnosis present

## 2019-07-19 ENCOUNTER — Encounter (HOSPITAL_COMMUNITY): Payer: Self-pay

## 2019-07-19 ENCOUNTER — Emergency Department (HOSPITAL_COMMUNITY)
Admission: EM | Admit: 2019-07-19 | Discharge: 2019-07-19 | Disposition: A | Discharge status: Home / Self Care | Payer: Medicare Other | Attending: Emergency Medicine | Admitting: Emergency Medicine

## 2019-07-19 ENCOUNTER — Emergency Department (HOSPITAL_COMMUNITY): Payer: Medicare Other

## 2019-07-19 DIAGNOSIS — G35 Multiple sclerosis: Secondary | ICD-10-CM

## 2019-07-19 DIAGNOSIS — R12 Heartburn: Secondary | ICD-10-CM | POA: Diagnosis not present

## 2019-07-19 LAB — CBC WITH DIFFERENTIAL/PLATELET
Abs Immature Granulocytes: 0.38 10*3/uL — ABNORMAL HIGH (ref 0.00–0.07)
Basophils Absolute: 0 10*3/uL (ref 0.0–0.1)
Basophils Relative: 0 %
Eosinophils Absolute: 0.1 10*3/uL (ref 0.0–0.5)
Eosinophils Relative: 1 %
HCT: 41.3 % (ref 36.0–46.0)
Hemoglobin: 13.7 g/dL (ref 12.0–15.0)
Immature Granulocytes: 4 %
Lymphocytes Relative: 3 %
Lymphs Abs: 0.3 10*3/uL — ABNORMAL LOW (ref 0.7–4.0)
MCH: 33.1 pg (ref 26.0–34.0)
MCHC: 33.2 g/dL (ref 30.0–36.0)
MCV: 99.8 fL (ref 80.0–100.0)
Monocytes Absolute: 1.2 10*3/uL — ABNORMAL HIGH (ref 0.1–1.0)
Monocytes Relative: 12 %
Neutro Abs: 8 10*3/uL — ABNORMAL HIGH (ref 1.7–7.7)
Neutrophils Relative %: 80 %
Platelets: 310 10*3/uL (ref 150–400)
RBC: 4.14 MIL/uL (ref 3.87–5.11)
RDW: 15.7 % — ABNORMAL HIGH (ref 11.5–15.5)
WBC: 9.9 10*3/uL (ref 4.0–10.5)
nRBC: 0.2 % (ref 0.0–0.2)

## 2019-07-19 LAB — TROPONIN I (HIGH SENSITIVITY)
Troponin I (High Sensitivity): 2 ng/L (ref <18)
Troponin I (High Sensitivity): 2 ng/L (ref <18)

## 2019-07-19 LAB — COMPREHENSIVE METABOLIC PANEL
ALT: 23 U/L (ref 0–44)
AST: 16 U/L (ref 15–41)
Albumin: 4 g/dL (ref 3.5–5.0)
Alkaline Phosphatase: 85 U/L (ref 38–126)
Anion gap: 5 (ref 5–15)
BUN: 17 mg/dL (ref 6–20)
CO2: 32 mmol/L (ref 22–32)
Calcium: 9.5 mg/dL (ref 8.9–10.3)
Chloride: 102 mmol/L (ref 98–111)
Creatinine, Ser: 1.61 mg/dL — ABNORMAL HIGH (ref 0.44–1.00)
GFR calc Af Amer: 46 mL/min — ABNORMAL LOW (ref >60)
GFR calc non Af Amer: 39 mL/min — ABNORMAL LOW (ref >60)
Glucose, Bld: 103 mg/dL — ABNORMAL HIGH (ref 70–99)
Potassium: 4.2 mmol/L (ref 3.5–5.1)
Sodium: 139 mmol/L (ref 135–145)
Total Bilirubin: 0.5 mg/dL (ref 0.3–1.2)
Total Protein: 6.8 g/dL (ref 6.5–8.1)

## 2019-07-19 LAB — D-DIMER, QUANTITATIVE: D-Dimer, Quant: 0.54 ug/mL-FEU — ABNORMAL HIGH (ref 0.00–0.50)

## 2019-07-19 LAB — LIPASE, BLOOD: Lipase: 29 U/L (ref 11–51)

## 2019-07-19 LAB — I-STAT BETA HCG BLOOD, ED (MC, WL, AP ONLY): I-stat hCG, quantitative: <5 m[IU]/mL (ref <5)

## 2019-07-19 MED ORDER — SUCRALFATE 1 G PO TABS
1.0000 g | ORAL_TABLET | Freq: Three times a day (TID) | ORAL | 0 refills | Status: DC
Start: 2019-07-19 — End: 2021-02-09

## 2019-07-19 MED ORDER — PANTOPRAZOLE SODIUM 40 MG IV SOLR
40.0000 mg | Freq: Once | INTRAVENOUS | Status: AC
  Administered 2019-07-19: 40 mg via INTRAVENOUS
  Filled 2019-07-19: qty 40

## 2019-07-19 MED ORDER — SUCRALFATE 1 GM/10ML PO SUSP
1.0000 g | Freq: Once | ORAL | Status: AC
  Administered 2019-07-19: 1 g via ORAL
  Filled 2019-07-19: qty 10

## 2019-07-19 MED ORDER — IOHEXOL 350 MG/ML SOLN
100.0000 mL | Freq: Once | INTRAVENOUS | Status: AC | PRN
  Administered 2019-07-19: 80 mL via INTRAVENOUS

## 2019-07-19 MED ORDER — SODIUM CHLORIDE 0.9% FLUSH
3.0000 mL | Freq: Once | INTRAVENOUS | Status: AC
  Administered 2019-07-19: 3 mL via INTRAVENOUS

## 2019-07-19 MED ORDER — ONDANSETRON HCL 4 MG/2ML IJ SOLN
4.0000 mg | Freq: Once | INTRAMUSCULAR | Status: AC
  Administered 2019-07-19: 4 mg via INTRAVENOUS
  Filled 2019-07-19: qty 2

## 2019-07-19 MED ORDER — SODIUM CHLORIDE (PF) 0.9 % IJ SOLN
INTRAMUSCULAR | Status: AC
  Filled 2019-07-19: qty 50

## 2019-07-19 MED ORDER — PANTOPRAZOLE SODIUM 20 MG PO TBEC
DELAYED_RELEASE_TABLET | ORAL | 0 refills | Status: DC
Start: 2019-07-19 — End: 2020-11-27

## 2019-07-19 NOTE — ED Notes (Signed)
Assisted pt to the bathroom. Pt back lying in bed. NAD noted. Will continue to monitor

## 2019-07-19 NOTE — ED Notes (Signed)
Pt lying in bed, eye's closed, chest rising and falling. Pt awakens easily and denies any needs. Will continue to monitor.

## 2019-07-19 NOTE — ED Provider Notes (Signed)
[facility] DEPT Provider Note: Georgena Spurling, MD, FACEP  CSN: [id number] MRN: [id number] ARRIVAL: 07/18/19 at [place]: [id number]   CHIEF COMPLAINT  Chest Pain   HISTORY OF PRESENT ILLNESS  07/19/19 2:09 AM Dawn Peters is a 39 y.o. female with a 1 week history of chest pain.  The chest pain is substernal going down into her epigastrium.  She describes it as feeling like heartburn and rates it as moderate in severity.  It has been waxing and waning over the past week but worsened yesterday evening.  It is worse with eating or drinking, swallowing or taking a deep breath.  She is not short of breath.  Since yesterday evening she has had multiple episodes of nausea and vomiting.  She has taken Tums and Pepto-Bismol without relief.  She has multiple sclerosis and has been experiencing an exacerbation recently.  Her left side, notably her left upper extremity, is weaker than usual and she has had multiple falls.   Past Medical History:  Diagnosis Date  . Anticoagulant long-term use    Eliquis  . Anxiety   . Chronic pain   . CKD (chronic kidney disease), stage III   . Depression   . 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 Gait disturbance    due to MS  . GERD (gastroesophageal reflux disease)    watch diet  . 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 History of DVT of lower extremity 2007  . History of encephalopathy [id number]   acute metabolic encephalopathy secondary to MS,  resolved  . History of MRSA infection 2004   right hip infection post THA  . History of pulmonary embolus (PE) 2005  . Hydronephrosis, left    w/ acute kidney injury 02/ 2019  . Hypertension   . Left-sided weakness    due to MS  . Migraines   . MS (multiple sclerosis) [facility]) neurologist-  dr Renne Musca   dx 2000--  . Muscle spasticity   . Neurogenic bladder    due to MS   . Pulmonary embolism, bilateral ([place]) 09/17/2017   treated w/ eliquis  . Strains to urinate   . Urgency of urination     Past Surgical History:  Procedure Laterality Date  . CYSTOSCOPY WITH RETROGRADE PYELOGRAM, URETEROSCOPY AND STENT PLACEMENT Bilateral 11/29/2017   Procedure: BILATERAL  RETROGRADE PYELOGRAM, LEFT DIAGNOSIC URETEROSCOPY AND STENT LEFT PLACEMENT;  Surgeon: Ardis Hughs, MD;  Location: [facility];  Service: Urology;  Laterality: Bilateral;  . HIP ARTHROSCOPY Left 07-05-2013   dr pill @[facility]    iliopsosas release, synovectomy  . REVISION TOTAL HIP ARTHROPLASTY Right 03-28-2003    dr Alvan Dame @[facility]   . TOTAL HIP ARTHROPLASTY Bilateral left 11-05-2002  dr Alvan Dame @[facility] ;   right 06/ 2004  @[facility]     Family History  Problem Relation Age of Onset  . Healthy Mother   . Healthy Father   . Breast cancer Other        paternal grandmother dx age 66  . Colon cancer Other        paternal grandmother  . Hypertension Other   . Diabetes Other     Social History   Tobacco Use  . Smoking status: Never Smoker  . Smokeless tobacco: Never Used  Substance Use Topics  . Alcohol use: No  . Drug use: No  Prior to Admission medications   Medication Sig Start Date End Date Taking? Authorizing Provider  amitriptyline (ELAVIL) 25 MG tablet Take 1-2 tablets (25-50 mg total) by mouth at bedtime. Patient taking differently: Take 25 mg by mouth at bedtime.  11/29/18  Yes Sater, Nanine Means, MD  amLODipine (NORVASC) 5 MG tablet Take 1 tablet (5 mg total) by mouth daily. Patient taking differently: Take 5 mg by mouth daily after breakfast.  05/21/17  Yes Arrien, Jimmy Picket, MD  amphetamine-dextroamphetamine (ADDERALL XR) 20 MG 24 hr capsule Take 1 capsule (20 mg total) by mouth daily. 06/27/19  Yes Sater, Nanine Means, MD  apixaban (ELIQUIS) 5 MG TABS tablet Take 1 tablet (5 mg total) by mouth 2 (two) times daily. 09/26/17  Yes Kayleen Memos, DO  baclofen (LIORESAL) 20 MG  tablet Take 1 tablet (20 mg total) by mouth 4 (four) times daily. 07/25/18  Yes Sater, Nanine Means, MD  busPIRone (BUSPAR) 15 MG tablet Take 1 tablet (15 mg total) by mouth 2 (two) times daily. Patient taking differently: Take 15 mg by mouth daily.  06/27/19  Yes Sater, Nanine Means, MD  citalopram (CELEXA) 20 MG tablet Take 1 tablet (20 mg total) by mouth daily. 02/19/19  Yes Sater, Nanine Means, MD  dalfampridine 10 MG TB12 Take 1 tablet (10 mg total) by mouth 2 (two) times daily. 06/24/19  Yes Sater, Nanine Means, MD  gabapentin (NEURONTIN) 800 MG tablet Take 1 tablet (800 mg total) by mouth 4 (four) times daily. 06/27/19  Yes Sater, Nanine Means, MD  lamoTRIgine (LAMICTAL) 200 MG tablet Take 1 tablet (200 mg total) by mouth 2 (two) times daily. 06/27/19  Yes Sater, Nanine Means, MD  metoprolol succinate (TOPROL-XL) 25 MG 24 hr tablet TAKE 1 TABLET BY MOUTH DAILY Patient taking differently: Take 25 mg by mouth daily.  03/11/19  Yes Sater, Nanine Means, MD  Multiple Vitamin (MULTIVITAMIN WITH MINERALS) TABS tablet Take 1 tablet by mouth daily. 09/20/17  Yes Kayleen Memos, DO  norethindrone (MICRONOR) 0.35 MG tablet Take 1 tablet (0.35 mg total) by mouth daily. 11/19/18  Yes Clarnce Flock, MD  oxyCODONE-acetaminophen (PERCOCET) 10-325 MG tablet Take 1 tablet by mouth every 4 (four) hours. 03/18/18  Yes [provider]  Ozanimod HCl (ZEPOSIA) 0.92 MG CAPS Take 0.93 mg by mouth daily.    Yes [provider]  predniSONE (DELTASONE) 50 MG tablet Take 10 pills po qd x 2 days for MS exacerbation Patient taking differently: Take 50 mg by mouth 2 (two) times daily with a meal.  12/27/18  Yes Sater, Nanine Means, MD  tamsulosin (FLOMAX) 0.4 MG CAPS capsule Take 1 capsule (0.4 mg total) by mouth daily. 06/27/19  Yes Sater, Nanine Means, MD  tiZANidine (ZANAFLEX) 4 MG tablet TAKE 1 TABLET BY MOUTH EVERY NIGHT AT BEDTIME Patient taking differently: Take 4 mg by mouth at bedtime.  04/01/19  Yes Sater, Nanine Means, MD   pantoprazole (PROTONIX) 20 MG tablet Take 1 tablet every morning at least 30 minutes before first dose of Carafate. 07/19/19   Zaryan Yakubov, MD  sucralfate (CARAFATE) 1 g tablet Take 1 tablet (1 g total) by mouth 4 (four) times daily -  with meals and at bedtime. 07/19/19   Ernesto Lashway, Jenny Reichmann, MD  SUMAtriptan (IMITREX) 100 MG tablet Take 1 tablet (100 mg total) by mouth once as needed for up to 1 dose for migraine. May repeat in 2 hours if headache persists or recurs. 02/19/19   Arlice Colt  A, MD  Oxcarbazepine (TRILEPTAL) 300 MG tablet Take 1 tablet (300 mg total) by mouth 2 (two) times daily. Patient not taking: Reported on 07/19/2019 11/11/15 07/19/19  Britt Bottom, MD    Allergies Ace inhibitors, Amoxicillin, Celexa [citalopram], and Lisinopril   REVIEW OF SYSTEMS  Negative except as noted here or in the History of Present Illness.   PHYSICAL EXAMINATION  Initial Vital Signs Blood pressure (!) 150/99, pulse (!) 103, temperature 98 F (36.7 C), temperature source Oral, resp. rate 14, height 5\' 5"  (1.651 m), weight 106.6 kg, last menstrual period 07/15/2019, SpO2 99 %.  Examination General: Well-developed, well-nourished female in no acute distress; appearance consistent with age of record HENT: normocephalic; atraumatic Eyes: pupils equal, round and reactive to light; extraocular muscles intact Neck: supple Heart: regular rate and rhythm Lungs: clear to auscultation bilaterally Abdomen: soft; nondistended; upper abdominal pressure reproduces substernal discomfort; bowel sounds present Extremities: No deformity; full range of motion; pulses normal Neurologic: Awake, alert and oriented; motor function intact in all extremities and symmetric; no facial droop Skin: Warm and dry Psychiatric: Normal mood and affect   RESULTS  Summary of this visit's results, reviewed and interpreted by myself:   EKG Interpretation  Date/Time:  Friday Jul 19 2019 00:43:11 EDT Ventricular Rate:   105 PR Interval:    QRS Duration: 90 QT Interval:  361 QTC Calculation: 478 R Axis:   23 Text Interpretation: Sinus tachycardia Low voltage, precordial leads Borderline T abnormalities, anterior leads No significant change was found Confirmed by Shanon Rosser [phone]) on 07/19/2019 2:08:37 AM      Laboratory Studies: Results for orders placed or performed during the hospital encounter of 07/19/19 (from the past 24 hour(s))  Troponin I (High Sensitivity)     Status: None   Collection Time: 07/19/19  3:03 AM  Result Value Ref Range   Troponin I (High Sensitivity) 2 <18 ng/L  CBC with Differential/Platelet     Status: Abnormal   Collection Time: 07/19/19  3:03 AM  Result Value Ref Range   WBC 9.9 4.0 - 10.5 K/uL   RBC 4.14 3.87 - 5.11 MIL/uL   Hemoglobin 13.7 12.0 - 15.0 g/dL   HCT 41.3 36.0 - 46.0 %   MCV 99.8 80.0 - 100.0 fL   MCH 33.1 26.0 - 34.0 pg   MCHC 33.2 30.0 - 36.0 g/dL   RDW 15.7 (H) 11.5 - 15.5 %   Platelets 310 150 - 400 K/uL   nRBC 0.2 0.0 - 0.2 %   Neutrophils Relative % 80 %   Neutro Abs 8.0 (H) 1.7 - 7.7 K/uL   Lymphocytes Relative 3 %   Lymphs Abs 0.3 (L) 0.7 - 4.0 K/uL   Monocytes Relative 12 %   Monocytes Absolute 1.2 (H) 0.1 - 1.0 K/uL   Eosinophils Relative 1 %   Eosinophils Absolute 0.1 0.0 - 0.5 K/uL   Basophils Relative 0 %   Basophils Absolute 0.0 0.0 - 0.1 K/uL   Immature Granulocytes 4 %   Abs Immature Granulocytes 0.38 (H) 0.00 - 0.07 K/uL  D-dimer, quantitative (not at [facility])     Status: Abnormal   Collection Time: 07/19/19  3:03 AM  Result Value Ref Range   D-Dimer, Quant 0.54 (H) 0.00 - 0.50 ug/mL-FEU  Comprehensive metabolic panel     Status: Abnormal   Collection Time: 07/19/19  3:03 AM  Result Value Ref Range   Sodium 139 135 - 145 mmol/L   Potassium 4.2 3.5 - 5.1  mmol/L   Chloride 102 98 - 111 mmol/L   CO2 32 22 - 32 mmol/L   Glucose, Bld 103 (H) 70 - 99 mg/dL   BUN 17 6 - 20 mg/dL   Creatinine, Ser 1.61 (H) 0.44 - 1.00 mg/dL    Calcium 9.5 8.9 - 10.3 mg/dL   Total Protein 6.8 6.5 - 8.1 g/dL   Albumin 4.0 3.5 - 5.0 g/dL   AST 16 15 - 41 U/L   ALT 23 0 - 44 U/L   Alkaline Phosphatase 85 38 - 126 U/L   Total Bilirubin 0.5 0.3 - 1.2 mg/dL   GFR calc non Af Amer 39 (L) >60 mL/min   GFR calc Af Amer 46 (L) >60 mL/min   Anion gap 5 5 - 15  Lipase, blood     Status: None   Collection Time: 07/19/19  3:03 AM  Result Value Ref Range   Lipase 29 11 - 51 U/L  I-Stat beta hCG blood, ED     Status: None   Collection Time: 07/19/19  3:37 AM  Result Value Ref Range   I-stat hCG, quantitative <5.0 <5 mIU/mL   Comment 3          Troponin I (High Sensitivity)     Status: None   Collection Time: 07/19/19  5:45 AM  Result Value Ref Range   Troponin I (High Sensitivity) 2 <18 ng/L   Imaging Studies: DG Chest 2 View  Result Date: 07/19/2019 CLINICAL DATA:  Chest pain EXAM: CHEST - 2 VIEW COMPARISON:  April 20, 2017 FINDINGS: The heart size and mediastinal contours are within normal limits. Slightly shallow degree of aeration with crowding of the bronchovascular structures. There is slight elevation of the right hemidiaphragm. The visualized skeletal structures are unremarkable. IMPRESSION: No active cardiopulmonary disease. Electronically Signed   By: Prudencio Pair M.D.   On: 07/19/2019 02:35   CT Angio Chest PE W and/or Wo Contrast  Result Date: 07/19/2019 CLINICAL DATA:  Chest and epigastric pain for the past few days with nausea and vomiting. History of DVT and PE. EXAM: CT ANGIOGRAPHY CHEST CT ABDOMEN WITH CONTRAST TECHNIQUE: Multidetector CT imaging of the chest was performed using the standard protocol during bolus administration of intravenous contrast. Multiplanar CT image reconstructions and MIPs were obtained to evaluate the vascular anatomy. Multidetector CT imaging of the abdomen was performed using the standard protocol during bolus administration of intravenous contrast. CONTRAST:  77mL OMNIPAQUE IOHEXOL 350 MG/ML  SOLN COMPARISON:  CTA chest dated September 18, 2017. CT abdomen pelvis dated May 19, 2017. FINDINGS: CTA CHEST FINDINGS Cardiovascular: Suboptimal evaluation of the pulmonary arteries due to contrast bolus timing and respiratory motion artifact. No definite pulmonary embolism. Normal heart size. No pericardial effusion. No thoracic aortic aneurysm. Mediastinum/Nodes: No enlarged mediastinal, hilar, or axillary lymph nodes. Thyroid and trachea demonstrate no significant findings. Unchanged mildly patulous esophagus. Lungs/Pleura: Patchy dependent subsegmental atelectasis in both lungs. No focal consolidation, pleural effusion, or pneumothorax. Unchanged 4 mm nodule in the right middle lobe (series 6, image 59), benign. Musculoskeletal: No chest wall abnormality. No acute or significant osseous findings. Review of the MIP images confirms the above findings. CT ABDOMEN FINDINGS Hepatobiliary: Unchanged small hepatic cysts. No new focal liver abnormality. The gallbladder is unremarkable. No biliary dilatation. Pancreas: Unremarkable. No pancreatic ductal dilatation or surrounding inflammatory changes. Spleen: Normal in size without focal abnormality. Adrenals/Urinary Tract: The adrenal glands are unremarkable. Unchanged subcentimeter low-density lesion in the right kidney, likely a cyst. Chronic  severe left hydroureteronephrosis with progressive renal parenchymal volume loss and cortical thinning. No renal calculi or right hydronephrosis. Stomach/Bowel: Small hiatal hernia. The stomach is otherwise within normal limits. No bowel wall thickening, distention, or surrounding inflammatory changes. Vascular/Lymphatic: Minimal aortic atherosclerosis. No enlarged abdominal lymph nodes. Other: No abdominal wall hernia or abnormality. No ascites or pneumoperitoneum. Musculoskeletal: No acute or significant osseous findings. Review of the MIP images confirms the above findings. IMPRESSION: 1. Suboptimal evaluation of the pulmonary  arteries. No definite pulmonary embolism. 2. No acute intrathoracic or intra-abdominal process. 3. Chronic severe left hydroureteronephrosis with progressive renal parenchymal volume loss and cortical thinning. 4. Aortic Atherosclerosis (ICD10-I70.0). Electronically Signed   By: Titus Dubin M.D.   On: 07/19/2019 06:35   CT ABDOMEN W CONTRAST  Result Date: 07/19/2019 CLINICAL DATA:  Chest and epigastric pain for the past few days with nausea and vomiting. History of DVT and PE. EXAM: CT ANGIOGRAPHY CHEST CT ABDOMEN WITH CONTRAST TECHNIQUE: Multidetector CT imaging of the chest was performed using the standard protocol during bolus administration of intravenous contrast. Multiplanar CT image reconstructions and MIPs were obtained to evaluate the vascular anatomy. Multidetector CT imaging of the abdomen was performed using the standard protocol during bolus administration of intravenous contrast. CONTRAST:  37mL OMNIPAQUE IOHEXOL 350 MG/ML SOLN COMPARISON:  CTA chest dated September 18, 2017. CT abdomen pelvis dated May 19, 2017. FINDINGS: CTA CHEST FINDINGS Cardiovascular: Suboptimal evaluation of the pulmonary arteries due to contrast bolus timing and respiratory motion artifact. No definite pulmonary embolism. Normal heart size. No pericardial effusion. No thoracic aortic aneurysm. Mediastinum/Nodes: No enlarged mediastinal, hilar, or axillary lymph nodes. Thyroid and trachea demonstrate no significant findings. Unchanged mildly patulous esophagus. Lungs/Pleura: Patchy dependent subsegmental atelectasis in both lungs. No focal consolidation, pleural effusion, or pneumothorax. Unchanged 4 mm nodule in the right middle lobe (series 6, image 59), benign. Musculoskeletal: No chest wall abnormality. No acute or significant osseous findings. Review of the MIP images confirms the above findings. CT ABDOMEN FINDINGS Hepatobiliary: Unchanged small hepatic cysts. No new focal liver abnormality. The gallbladder is  unremarkable. No biliary dilatation. Pancreas: Unremarkable. No pancreatic ductal dilatation or surrounding inflammatory changes. Spleen: Normal in size without focal abnormality. Adrenals/Urinary Tract: The adrenal glands are unremarkable. Unchanged subcentimeter low-density lesion in the right kidney, likely a cyst. Chronic severe left hydroureteronephrosis with progressive renal parenchymal volume loss and cortical thinning. No renal calculi or right hydronephrosis. Stomach/Bowel: Small hiatal hernia. The stomach is otherwise within normal limits. No bowel wall thickening, distention, or surrounding inflammatory changes. Vascular/Lymphatic: Minimal aortic atherosclerosis. No enlarged abdominal lymph nodes. Other: No abdominal wall hernia or abnormality. No ascites or pneumoperitoneum. Musculoskeletal: No acute or significant osseous findings. Review of the MIP images confirms the above findings. IMPRESSION: 1. Suboptimal evaluation of the pulmonary arteries. No definite pulmonary embolism. 2. No acute intrathoracic or intra-abdominal process. 3. Chronic severe left hydroureteronephrosis with progressive renal parenchymal volume loss and cortical thinning. 4. Aortic Atherosclerosis (ICD10-I70.0). Electronically Signed   By: Titus Dubin M.D.   On: 07/19/2019 06:35    ED COURSE and MDM  Nursing notes, initial and subsequent vitals signs, including pulse oximetry, reviewed and interpreted by myself.  Vitals:   07/19/19 0245 07/19/19 0345 07/19/19 0442 07/19/19 0515  BP: (!) 142/102 (!) 141/104 (!) 143/115 (!) 145/102  Pulse: 95 91 94 94  Resp: 15 14 15 12   Temp:      TempSrc:      SpO2: 99% 100% 96% 96%  Weight:  Height:       Medications  sodium chloride (PF) 0.9 % injection (has no administration in time range)  sodium chloride flush (NS) 0.9 % injection 3 mL (3 mLs Intravenous Given 07/19/19 0325)  ondansetron (ZOFRAN) injection 4 mg (4 mg Intravenous Given 07/19/19 0324)  pantoprazole  (PROTONIX) injection 40 mg (40 mg Intravenous Given 07/19/19 0324)  sucralfate (CARAFATE) 1 GM/10ML suspension 1 g (1 g Oral Given 07/19/19 0323)  iohexol (OMNIPAQUE) 350 MG/ML injection 100 mL (80 mLs Intravenous Contrast Given 07/19/19 0554)   6:52 AM Patient's pain is somewhat better.  I suspect her symptoms are due to gastritis and/or GERD.  We will start her on a PPI and Carafate.  She has an appointment pending with Dr. Felecia Shelling her neurologist but states she needs an MRI before she sees him.  He has ordered these but she has not been able to follow-up.  She was advised to contact [facility] regarding when and where to have these done.  She is aware of her chronic kidney condition.  PROCEDURES  Procedures   ED DIAGNOSES     ICD-10-CM   1. Heartburn  R12   2. Multiple sclerosis exacerbation ([place])  G35        Jamya Starry, MD 07/19/19 0700

## 2019-07-19 NOTE — ED Triage Notes (Addendum)
Arrived POV from home patient reports chest pain over past few days, nausea and vomiting, painful swallowing, and left sided leg and arm weakness. Patient reports left sided weakness is associated with MS and that she has fallen more than 10 times

## 2019-07-19 NOTE — ED Notes (Signed)
Pt sitting up in bed. NAD noted. Pt ready for d/c 

## 2019-07-19 NOTE — ED Notes (Signed)
Pt lying in bed. Water given. Denies any other needs. Will continue to monitor.

## 2019-07-22 ENCOUNTER — Ambulatory Visit: Payer: Medicare Other

## 2019-07-23 ENCOUNTER — Encounter: Payer: Self-pay | Admitting: *Deleted

## 2019-07-24 NOTE — Telephone Encounter (Signed)
Patient would like her MRI sooner. I faxed the order. They will reach out to the patient to schedule.

## 2019-07-25 NOTE — Telephone Encounter (Signed)
Patient is scheduled at [facility] for 08/06/19.

## 2019-07-27 ENCOUNTER — Other Ambulatory Visit: Payer: Self-pay | Admitting: Neurology

## 2019-07-31 ENCOUNTER — Telehealth: Payer: Self-pay | Admitting: Neurology

## 2019-07-31 NOTE — Telephone Encounter (Signed)
We last spoke with pt on 07/10/19 and advised that Dr. Felecia Shelling did not recommend IV steroids but rather MRI brain/cervical to check for changes since it had been over a year since her last ones. Appears she is scheduled for 08/23/19 for those. I will discuss with MD to see how to proceed

## 2019-07-31 NOTE — Telephone Encounter (Signed)
Dr. Sater- what would you recommend? 

## 2019-07-31 NOTE — Telephone Encounter (Signed)
Pt called stating she is needing her steroid shots as she has fallen about 10 times and doesn't want to keep falling.   Cb#[phone]

## 2019-08-01 ENCOUNTER — Telehealth: Payer: Self-pay | Admitting: Neurology

## 2019-08-01 MED ORDER — METHYLPREDNISOLONE 4 MG PO TABS: ORAL_TABLET | ORAL | 0 refills | Status: DC

## 2019-08-01 NOTE — Telephone Encounter (Signed)
Dr. Sater- please advise 

## 2019-08-01 NOTE — Telephone Encounter (Signed)
Pt has called asking if Dr Felecia Shelling has responded to her request.  Pt was told once Emma,RN has a response she will call pt.  Pt is asking to be called @ [phone].  Pt asked that it be noted she has fallen over 10 times.

## 2019-08-01 NOTE — Telephone Encounter (Signed)
She called feeling weaker and wanted to know if she could get steroids,    Unlikely to be exacerbation but steroids have helped her feel stronger for a while in the past.   I'll call in some to her steroid for a short oral course

## 2019-08-01 NOTE — Telephone Encounter (Signed)
I sent in a prescription for steroid pack

## 2019-08-01 NOTE — Telephone Encounter (Signed)
Called, LVM letting pt know Dr. Felecia Shelling called in oral steroid pack for her to [place], [address] AT [address]. Advised we are closed the rest of the day, tomorrow and Monday for the holiday. We will be open starting on Tuesday.

## 2019-08-07 ENCOUNTER — Other Ambulatory Visit: Payer: Self-pay | Admitting: Neurology

## 2019-08-16 ENCOUNTER — Telehealth: Payer: Self-pay | Admitting: Neurology

## 2019-08-16 NOTE — Telephone Encounter (Signed)
Pt called needing to discuss her medications with the RN. Pt states she has fallen several times and is wanting to know if she would be able to get a refill on her predniSONE (DELTASONE) 50 MG tablet. Please advise.

## 2019-08-19 ENCOUNTER — Telehealth: Payer: Self-pay | Admitting: Neurology

## 2019-08-19 NOTE — Telephone Encounter (Signed)
Pt states due to falling so much she would like to have Prednisone called in for her

## 2019-08-19 NOTE — Telephone Encounter (Signed)
I called the patient back and reviewed most recent chart encounters between pt/[facility]. Pt stated she was unaware the Methylprednisolone dose pack was sent to pharmacy on 08/01/19. I apologized and let her know Terrence Dupont had LVM on 5/27. Pt will contact pharmacy to get this filled and started. Pt said her weakness is the same as it was on 08/01/19. She is using a walker, weak on L side, falling. Pt will call our office if she worsens or does not improve with steroid. She verbalized appreciation for the call.  [facility] and spoke with [facility]. Confirmed they have Methylprednisolone Rx on file from 08/01/19. They will fill for the pt.

## 2019-08-20 NOTE — Telephone Encounter (Signed)
Gildardo Griffes, RN     08/19/19 4:27 PM Note I called the patient back and reviewed most recent chart encounters between pt/[facility]. Pt stated she was unaware the Methylprednisolone dose pack was sent to pharmacy on 08/01/19. I apologized and let her know Terrence Dupont had LVM on 5/27. Pt will contact pharmacy to get this filled and started. Pt said her weakness is the same as it was on 08/01/19. She is using a walker, weak on L side, falling. Pt will call our office if she worsens or does not improve with steroid. She verbalized appreciation for the call.  [facility] and spoke with [facility]. Confirmed they have Methylprednisolone Rx on file from 08/01/19. They will fill for the pt.      I called the pt back and lvm asking if she was able to pick up the prescription med.

## 2019-08-20 NOTE — Telephone Encounter (Signed)
Patient called today and states she had a missed call from [place] around 10 am today. Did you try to contact the patient?

## 2019-08-23 ENCOUNTER — Other Ambulatory Visit: Payer: Medicare Other

## 2019-08-23 ENCOUNTER — Other Ambulatory Visit: Payer: Self-pay | Admitting: Neurology

## 2019-08-27 NOTE — Telephone Encounter (Signed)
Patient was scheduled at [facility] for 08/27/19 and she no showed the MRI appt.

## 2019-09-01 ENCOUNTER — Emergency Department (HOSPITAL_COMMUNITY): Payer: Medicare Other

## 2019-09-01 ENCOUNTER — Encounter (HOSPITAL_COMMUNITY): Payer: Self-pay | Admitting: Emergency Medicine

## 2019-09-01 ENCOUNTER — Emergency Department (HOSPITAL_COMMUNITY)
Admission: EM | Admit: 2019-09-01 | Discharge: 2019-09-01 | Disposition: A | Discharge status: Home / Self Care | Payer: Medicare Other | Attending: Emergency Medicine | Admitting: Emergency Medicine

## 2019-09-01 ENCOUNTER — Other Ambulatory Visit: Payer: Self-pay

## 2019-09-01 DIAGNOSIS — I129 Hypertensive chronic kidney disease with stage 1 through stage 4 chronic kidney disease, or unspecified chronic kidney disease: Secondary | ICD-10-CM | POA: Insufficient documentation

## 2019-09-01 DIAGNOSIS — R531 Weakness: Secondary | ICD-10-CM | POA: Diagnosis present

## 2019-09-01 DIAGNOSIS — N183 Chronic kidney disease, stage 3 unspecified: Secondary | ICD-10-CM | POA: Diagnosis not present

## 2019-09-01 DIAGNOSIS — G35 Multiple sclerosis: Secondary | ICD-10-CM

## 2019-09-01 DIAGNOSIS — Z96643 Presence of artificial hip joint, bilateral: Secondary | ICD-10-CM | POA: Diagnosis not present

## 2019-09-01 DIAGNOSIS — R29898 Other symptoms and signs involving the musculoskeletal system: Secondary | ICD-10-CM

## 2019-09-01 DIAGNOSIS — Z79899 Other long term (current) drug therapy: Secondary | ICD-10-CM | POA: Diagnosis not present

## 2019-09-01 DIAGNOSIS — R2689 Other abnormalities of gait and mobility: Secondary | ICD-10-CM | POA: Insufficient documentation

## 2019-09-01 LAB — COMPREHENSIVE METABOLIC PANEL
ALT: 23 U/L (ref 0–44)
AST: 23 U/L (ref 15–41)
Albumin: 4.2 g/dL (ref 3.5–5.0)
Alkaline Phosphatase: 80 U/L (ref 38–126)
Anion gap: 9 (ref 5–15)
BUN: 18 mg/dL (ref 6–20)
CO2: 26 mmol/L (ref 22–32)
Calcium: 9 mg/dL (ref 8.9–10.3)
Chloride: 103 mmol/L (ref 98–111)
Creatinine, Ser: 1.64 mg/dL — ABNORMAL HIGH (ref 0.44–1.00)
GFR calc Af Amer: 45 mL/min — ABNORMAL LOW (ref >60)
GFR calc non Af Amer: 38 mL/min — ABNORMAL LOW (ref >60)
Glucose, Bld: 90 mg/dL (ref 70–99)
Potassium: 3.8 mmol/L (ref 3.5–5.1)
Sodium: 138 mmol/L (ref 135–145)
Total Bilirubin: 0.6 mg/dL (ref 0.3–1.2)
Total Protein: 7 g/dL (ref 6.5–8.1)

## 2019-09-01 LAB — URINALYSIS, ROUTINE W REFLEX MICROSCOPIC
Bilirubin Urine: NEGATIVE
Glucose, UA: NEGATIVE mg/dL
Ketones, ur: NEGATIVE mg/dL
Nitrite: NEGATIVE
Protein, ur: 30 mg/dL — AB
Specific Gravity, Urine: 1.024 (ref 1.005–1.030)
pH: 5 (ref 5.0–8.0)

## 2019-09-01 LAB — CBC WITH DIFFERENTIAL/PLATELET
Abs Immature Granulocytes: 0.12 10*3/uL — ABNORMAL HIGH (ref 0.00–0.07)
Basophils Absolute: 0 10*3/uL (ref 0.0–0.1)
Basophils Relative: 0 %
Eosinophils Absolute: 0.2 10*3/uL (ref 0.0–0.5)
Eosinophils Relative: 3 %
HCT: 40.1 % (ref 36.0–46.0)
Hemoglobin: 13.7 g/dL (ref 12.0–15.0)
Immature Granulocytes: 2 %
Lymphocytes Relative: 5 %
Lymphs Abs: 0.4 10*3/uL — ABNORMAL LOW (ref 0.7–4.0)
MCH: 33.1 pg (ref 26.0–34.0)
MCHC: 34.2 g/dL (ref 30.0–36.0)
MCV: 96.9 fL (ref 80.0–100.0)
Monocytes Absolute: 0.7 10*3/uL (ref 0.1–1.0)
Monocytes Relative: 11 %
Neutro Abs: 5.5 10*3/uL (ref 1.7–7.7)
Neutrophils Relative %: 79 %
Platelets: 272 10*3/uL (ref 150–400)
RBC: 4.14 MIL/uL (ref 3.87–5.11)
RDW: 14.6 % (ref 11.5–15.5)
WBC: 7 10*3/uL (ref 4.0–10.5)
nRBC: 0 % (ref 0.0–0.2)

## 2019-09-01 LAB — HCG, QUANTITATIVE, PREGNANCY: hCG, Beta Chain, Quant, S: <1 m[IU]/mL (ref <5)

## 2019-09-01 MED ORDER — LORAZEPAM 2 MG/ML IJ SOLN
1.0000 mg | Freq: Once | INTRAMUSCULAR | Status: AC
  Administered 2019-09-01: 1 mg via INTRAVENOUS
  Filled 2019-09-01: qty 1

## 2019-09-01 MED ORDER — GADOBUTROL 1 MMOL/ML IV SOLN
10.0000 mL | Freq: Once | INTRAVENOUS | Status: AC | PRN
  Administered 2019-09-01: 10 mL via INTRAVENOUS

## 2019-09-01 MED ORDER — METHYLPREDNISOLONE 4 MG PO TABS: ORAL_TABLET | ORAL | 0 refills | Status: DC

## 2019-09-01 NOTE — ED Provider Notes (Addendum)
Patient received at signout from [facility].  Please see his note for full HPI.  In short, 39 year old female with complaints of left leg weakness for approximately a week.  She has left-sided weakness at baseline secondary to MS, however she states that her leg has been weaker than normal and she has had multiple falls.  She is followed by Dr. Felecia Shelling with neurology.  She is taking her as opposed as prescribed.  Dr. Malen Gauze neurology was consulted previous provider, and it was recommended to have a MRI of the brain and C-spine with and without contrast to evaluate for new lesions given she has not had a MRI in the last 6 months.  If she had new lesions, they recommended admission.  If normal, she could be discharged with prednisone and follow-up with her neurologist.  Her work-up otherwise was overall reassuring, CBC without any abnormalities, CMP without significant electrode abnormalities, slightly increased creatinine of 1.64, though per chart review this seems to be elevated and is at baseline.  UA with large hemoglobin, consistent with patient's menstrual cycle, with small leukocytes and bacteria.  CT scan without acute abnormalities.  Seems patient pending MRI with follow-up.   I personally reviewed the patient's MRI results, which showed no new changes or lesions.  I discussed this with the patient, who is overall reassured.  Will DC with prednisone burst as recommended by neurology.  I encouraged the patient to follow-up with her neurologist Dr. Felecia Shelling.  Patient denies any urinary symptoms, states that "they always ask me that" because her urine always suggest that she may have a UTI.  She denies any symptoms at this time.  Therefore we will forego treatment given asymptomatic UA.  The patient overall is reassured by the work-up, all of her questions have been answered to her satisfaction, she voices understanding is agreeable to this plan.  She could likely benefit from physical therapy.  Strict return  precautions given.  At this stage in the ED course, the patient is medically screened and stable for discharge.  I discussed the case with Dr. Roslynn Amble who is agreeable to the above plan and disposition.   Physical Exam  BP 124/81   Pulse 100   Temp 97.7 F (36.5 C) (Oral)   Resp 20   Ht 5\' 6"  (1.676 m)   Wt 106.6 kg   LMP 09/01/2019   SpO2 99%   BMI 37.93 kg/m   Physical Exam Vitals and nursing note reviewed.  Constitutional:      General: She is not in acute distress.    Appearance: Normal appearance. She is well-developed. She is not ill-appearing or diaphoretic.  HENT:     Head: Normocephalic and atraumatic.  Eyes:     Conjunctiva/sclera: Conjunctivae normal.     Pupils: Pupils are equal, round, and reactive to light.  Cardiovascular:     Rate and Rhythm: Normal rate and regular rhythm.     Heart sounds: No murmur heard.   Pulmonary:     Effort: Pulmonary effort is normal. No respiratory distress.     Breath sounds: Normal breath sounds.  Abdominal:     Palpations: Abdomen is soft.     Tenderness: There is no abdominal tenderness.  Musculoskeletal:        General: No swelling, tenderness, deformity or signs of injury. Normal range of motion.     Cervical back: Neck supple.     Right lower leg: No edema.     Left lower leg: No edema.  Comments: Tremor at baseline, 3/5 left leg strength, sensations intact.  Full range of motion on passive rotation.5/5 right leg strength with full range of motion   Skin:    General: Skin is warm and dry.     Capillary Refill: Capillary refill takes less than 2 seconds.     Findings: No bruising, erythema or rash.  Neurological:     General: No focal deficit present.     Mental Status: She is alert and oriented to person, place, and time.     Sensory: No sensory deficit.     Motor: Weakness present.     Gait: Gait abnormal (Patient with abnormal gait at baseline, generally has to use a walker).     Comments: Mental Status:   Alert, thought content appropriate, able to give a coherent history. Speech fluent without evidence of aphasia. Able to follow 2 step commands without difficulty.  Cranial Nerves:  II: Peripheral visual fields grossly normal, pupils equal, round, reactive to light III,IV, VI: ptosis not present, extra-ocular motions intact bilaterally  V,VII: smile symmetric, facial light touch sensation equal VIII: hearing grossly normal to voice  X: uvula elevates symmetrically  XI: bilateral shoulder shrug symmetric and strong XII: midline tongue extension without fassiculations Motor:  Sensory: light touch normal in all extremities. Cerebellar: normal finger-to-nose with bilateral upper extremities, Romberg sign absent; abnormal heel to shin w/ tremor  Gait: abnormal gate at baseline    Psychiatric:        Mood and Affect: Mood normal.        Behavior: Behavior normal.     ED Course/Procedures    Results for orders placed or performed during the hospital encounter of 09/01/19  Comprehensive metabolic panel  Result Value Ref Range   Sodium 138 135 - 145 mmol/L   Potassium 3.8 3.5 - 5.1 mmol/L   Chloride 103 98 - 111 mmol/L   CO2 26 22 - 32 mmol/L   Glucose, Bld 90 70 - 99 mg/dL   BUN 18 6 - 20 mg/dL   Creatinine, Ser 1.64 (H) 0.44 - 1.00 mg/dL   Calcium 9.0 8.9 - 10.3 mg/dL   Total Protein 7.0 6.5 - 8.1 g/dL   Albumin 4.2 3.5 - 5.0 g/dL   AST 23 15 - 41 U/L   ALT 23 0 - 44 U/L   Alkaline Phosphatase 80 38 - 126 U/L   Total Bilirubin 0.6 0.3 - 1.2 mg/dL   GFR calc non Af Amer 38 (L) >60 mL/min   GFR calc Af Amer 45 (L) >60 mL/min   Anion gap 9 5 - 15  CBC with Differential  Result Value Ref Range   WBC 7.0 4.0 - 10.5 K/uL   RBC 4.14 3.87 - 5.11 MIL/uL   Hemoglobin 13.7 12.0 - 15.0 g/dL   HCT 40.1 36 - 46 %   MCV 96.9 80.0 - 100.0 fL   MCH 33.1 26.0 - 34.0 pg   MCHC 34.2 30.0 - 36.0 g/dL   RDW 14.6 11.5 - 15.5 %   Platelets 272 150 - 400 K/uL   nRBC 0.0 0.0 - 0.2 %    Neutrophils Relative % 79 %   Neutro Abs 5.5 1.7 - 7.7 K/uL   Lymphocytes Relative 5 %   Lymphs Abs 0.4 (L) 0.7 - 4.0 K/uL   Monocytes Relative 11 %   Monocytes Absolute 0.7 0 - 1 K/uL   Eosinophils Relative 3 %   Eosinophils Absolute 0.2 0 - 0 K/uL  Basophils Relative 0 %   Basophils Absolute 0.0 0 - 0 K/uL   Immature Granulocytes 2 %   Abs Immature Granulocytes 0.12 (H) 0.00 - 0.07 K/uL  Urinalysis, Routine w reflex microscopic  Result Value Ref Range   Color, Urine YELLOW (A) YELLOW   APPearance CLOUDY (A) CLEAR   Specific Gravity, Urine 1.024 1.005 - 1.030   pH 5.0 5.0 - 8.0   Glucose, UA NEGATIVE NEGATIVE mg/dL   Hgb urine dipstick LARGE (A) NEGATIVE   Bilirubin Urine NEGATIVE NEGATIVE   Ketones, ur NEGATIVE NEGATIVE mg/dL   Protein, ur 30 (A) NEGATIVE mg/dL   Nitrite NEGATIVE NEGATIVE   Leukocytes,Ua SMALL (A) NEGATIVE   RBC / HPF 21-50 0 - 5 RBC/hpf   WBC, UA 21-50 0 - 5 WBC/hpf   Bacteria, UA RARE (A) NONE SEEN   Squamous Epithelial / LPF 11-20 0 - 5   Mucus PRESENT   hCG, quantitative, pregnancy  Result Value Ref Range   hCG, Beta Chain, Quant, S <1 <5 mIU/mL   CT Head Wo Contrast  Result Date: 09/01/2019 CLINICAL DATA:  Left-sided weakness EXAM: CT HEAD WITHOUT CONTRAST TECHNIQUE: Contiguous axial images were obtained from the base of the skull through the vertex without intravenous contrast. COMPARISON:  10/03/2017 FINDINGS: Brain: There is no mass, hemorrhage or extra-axial collection. The size and configuration of the ventricles and extra-axial CSF spaces are normal. The brain parenchyma is normal, without acute or chronic infarction. Vascular: No abnormal hyperdensity of the major intracranial arteries or dural venous sinuses. No intracranial atherosclerosis. Skull: The visualized skull base, calvarium and extracranial soft tissues are normal. Sinuses/Orbits: No fluid levels or advanced mucosal thickening of the visualized paranasal sinuses. No mastoid or middle ear  effusion. The orbits are normal. IMPRESSION: Normal head CT. Electronically Signed   By: Ulyses Jarred M.D.   On: 09/01/2019 05:50   MR Brain W and Wo Contrast  Result Date: 09/01/2019 CLINICAL DATA:  Multiple sclerosis with new onset left-sided weakness. EXAM: MRI HEAD WITHOUT AND WITH CONTRAST MRI CERVICAL SPINE WITHOUT AND WITH CONTRAST TECHNIQUE: Multiplanar, multiecho pulse sequences of the brain and surrounding structures, and cervical spine, to include the craniocervical junction and cervicothoracic junction, were obtained without and with intravenous contrast. CONTRAST:  69mL GADAVIST GADOBUTROL 1 MMOL/ML IV SOLN COMPARISON:  Brain and cervical spine MRI  03/13/2018 FINDINGS: MRI HEAD FINDINGS Brain: No acute infarct, acute hemorrhage or extra-axial collection. There are multiple hyperintense T2-weighted signal lesions within the periventricular and juxtacortical white matter, some of which show corresponding low T1-weighted signal. There are at least 2 infratentorial lesions. The number, size and distribution of lesions is unchanged from the most recent MRI. There are no contrast-enhancing lesions to indicate active demyelination. Normal volume of CSF spaces. No chronic microhemorrhage. Normal midline structures. Vascular: Normal flow voids. Skull and upper cervical spine: Normal marrow signal. Sinuses/Orbits: Negative. Other: None. MRI CERVICAL SPINE FINDINGS Alignment: Physiologic. Vertebrae: No fracture, evidence of discitis, or bone lesion. Cord: Normal signal and morphology. Posterior Fossa, vertebral arteries, paraspinal tissues: Negative. Disc levels: No disc herniation or stenosis. IMPRESSION: 1. Unchanged distribution of white matter lesions in a pattern compatible with multiple sclerosis. No active demyelinating lesions. 2. Normal MRI of the cervical spine. Electronically Signed   By: Ulyses Jarred M.D.   On: 09/01/2019 19:35   MR CERVICAL SPINE W WO CONTRAST  Result Date:  09/01/2019 CLINICAL DATA:  Multiple sclerosis with new onset left-sided weakness. EXAM: MRI HEAD WITHOUT AND WITH  CONTRAST MRI CERVICAL SPINE WITHOUT AND WITH CONTRAST TECHNIQUE: Multiplanar, multiecho pulse sequences of the brain and surrounding structures, and cervical spine, to include the craniocervical junction and cervicothoracic junction, were obtained without and with intravenous contrast. CONTRAST:  93mL GADAVIST GADOBUTROL 1 MMOL/ML IV SOLN COMPARISON:  Brain and cervical spine MRI  03/13/2018 FINDINGS: MRI HEAD FINDINGS Brain: No acute infarct, acute hemorrhage or extra-axial collection. There are multiple hyperintense T2-weighted signal lesions within the periventricular and juxtacortical white matter, some of which show corresponding low T1-weighted signal. There are at least 2 infratentorial lesions. The number, size and distribution of lesions is unchanged from the most recent MRI. There are no contrast-enhancing lesions to indicate active demyelination. Normal volume of CSF spaces. No chronic microhemorrhage. Normal midline structures. Vascular: Normal flow voids. Skull and upper cervical spine: Normal marrow signal. Sinuses/Orbits: Negative. Other: None. MRI CERVICAL SPINE FINDINGS Alignment: Physiologic. Vertebrae: No fracture, evidence of discitis, or bone lesion. Cord: Normal signal and morphology. Posterior Fossa, vertebral arteries, paraspinal tissues: Negative. Disc levels: No disc herniation or stenosis. IMPRESSION: 1. Unchanged distribution of white matter lesions in a pattern compatible with multiple sclerosis. No active demyelinating lesions. 2. Normal MRI of the cervical spine. Electronically Signed   By: Ulyses Jarred M.D.   On: 09/01/2019 19:35    Procedures  MDM             Garald Balding, PA-C 09/01/19 2025    Lucrezia Starch, MD 09/02/19 [phone]

## 2019-09-01 NOTE — ED Notes (Signed)
Checked on pt in restroom, she states she is doing fine.

## 2019-09-01 NOTE — ED Triage Notes (Signed)
Patient comes from by [facility]. Patient has a hx of MS and patient is complaining of left side weakness. Patient states that this started on Thursday. Patient states that it just got worse today.

## 2019-09-01 NOTE — ED Notes (Signed)
Pt to restroom via wheelchair.

## 2019-09-01 NOTE — ED Notes (Signed)
Pt transported to MRI 

## 2019-09-01 NOTE — ED Provider Notes (Signed)
[place] DEPT Provider Note   CSN: [id number] Arrival date & time: 09/01/19  9147     History Chief Complaint  Patient presents with  . Fall  . Weakness    Dawn Peters is a 39 y.o. female.  HPI   Patient brought to the emergency department with chief complaint of left leg weakness that has being on for 1 week.  Patient states she feels like her leg has been weaker and caused her to have multiple falls.  Patient denies hitting her head, or losing consciousness, she can remember the events before and after each fall. Patient states she has history of MS diagnosed in 2000  and sees Dr. Felecia Shelling for her MS.  Patient is currently on zeposia and has had no recent changes in medication.  Patient has history of weakness on the left side especially her left leg, she uses a walker at home to get around but feels like her leg has become weaker over the last week.  Patient denies any other complaints, denies headache, fever, chills, sore throat, shortness of breath, chest pain, abdominal pain, nausea, vomiting, urinary symptoms or diarrhea constipation increased swelling in her legs,  Past Medical History:  Diagnosis Date  . Anticoagulant long-term use    Eliquis  . Anxiety   . Chronic pain   . CKD (chronic kidney disease), stage III   . Depression   . 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 Gait disturbance    due to MS  . GERD (gastroesophageal reflux disease)    watch diet  . 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 History of DVT of lower extremity 2007  . History of encephalopathy 82/9562   acute metabolic encephalopathy secondary to MS,  resolved  . History of MRSA infection 2004   right hip infection post THA  . History of pulmonary embolus (PE) 2005  . Hydronephrosis, left    w/ acute kidney injury 02/ 2019  .  Hypertension   . Left-sided weakness    due to MS  . Migraines   . MS (multiple sclerosis) [facility]) neurologist-  dr Renne Musca   dx 2000--  . Muscle spasticity   . Neurogenic bladder    due to MS  . Pulmonary embolism, bilateral ([place]) 09/17/2017   treated w/ eliquis  . Strains to urinate   . Urgency of urination     Patient Active Problem List   Diagnosis Date Noted  . Amenorrhea 11/20/2018  . Well woman exam 11/20/2018  . MS (multiple sclerosis) ([place]) 03/14/2018  . CKD (chronic kidney disease), stage III   . Obesity (BMI 30.0-34.9)   . Anemia 01/09/2018  . Pulmonary embolism ([place]) 09/18/2017  . Elevated serum creatinine 05/12/2017  . Acute encephalopathy 04/20/2017  . Hydronephrosis of left kidney 10/24/2016  . Attention deficit 10/24/2016  . Left leg weakness 08/13/2016  . Chronic pain 08/13/2016  . Mild renal insufficiency 08/13/2016  . Left-sided weakness   . High risk medication use 09/15/2015  . Hip pain, bilateral 07/28/2015  . Urinary hesitancy 07/28/2015  . Multiple sclerosis exacerbation ([place]) 07/17/2015  . Urinary disorder 06/11/2015  . Gait disturbance 05/19/2015  . Numbness 05/19/2015  . Depression with anxiety 05/13/2015  . Other fatigue 05/13/2015  . Left knee pain 05/13/2015  . Avascular necrosis of bones of both hips ([place]) 05/13/2015  .  Screening for HIV (human immunodeficiency virus) 07/08/2014  . Essential hypertension, benign 11/19/2013  . Anxiety state, unspecified 11/19/2013  . Screen for STD (sexually transmitted disease) 11/01/2013  . Encounter for routine gynecological examination 11/01/2013  . Oral contraceptive use 10/20/2011  . Multiple sclerosis ([place]) 08/08/2007  . RASH AND OTHER NONSPECIFIC SKIN ERUPTION 06/22/2007  . AVASCULAR NECROSIS, FEMORAL HEAD 03/11/2006  . Essential hypertension 02/03/2006  . 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 DVT, HX OF 02/03/2006    Past Surgical History:  Procedure Laterality Date  . CYSTOSCOPY WITH RETROGRADE  PYELOGRAM, URETEROSCOPY AND STENT PLACEMENT Bilateral 11/29/2017   Procedure: BILATERAL  RETROGRADE PYELOGRAM, LEFT DIAGNOSIC URETEROSCOPY AND STENT LEFT PLACEMENT;  Surgeon: Ardis Hughs, MD;  Location[facility];  Service: Urology;  Laterality: Bilateral;  . HIP ARTHROSCOPY Left 07-05-2013   dr pill @[facility]    iliopsosas release, synovectomy  . REVISION TOTAL HIP ARTHROPLASTY Right 03-28-2003    dr Alvan Dame @[facility]   . TOTAL HIP ARTHROPLASTY Bilateral left 11-05-2002  dr Alvan Dame @[facility] ;   right 06/ 2004  @[facility]      OB History    Gravida  1   Para  1   Term  1   Preterm      AB      Living  1     SAB      TAB      Ectopic      Multiple      Live Births  1           Family History  Problem Relation Age of Onset  . Healthy Mother   . Healthy Father   . Breast cancer Other        paternal grandmother dx age 14  . Colon cancer Other        paternal grandmother  . Hypertension Other   . Diabetes Other     Social History   Tobacco Use  . Smoking status: Never Smoker  . Smokeless tobacco: Never Used  Vaping Use  . Vaping Use: Never used  Substance Use Topics  . Alcohol use: No  . Drug use: No    Home Medications Prior to Admission medications   Medication Sig Start Date End Date Taking? Authorizing Provider  amitriptyline (ELAVIL) 25 MG tablet TAKE 1 TO 2 TABLETS(25 TO 50 MG) BY MOUTH AT BEDTIME Patient taking differently: Take 50 mg by mouth at bedtime.  08/08/19  Yes Sater, Nanine Means, MD  amLODipine (NORVASC) 5 MG tablet Take 1 tablet (5 mg total) by mouth daily. Patient taking differently: Take 5 mg by mouth daily after breakfast.  05/21/17  Yes Arrien, Jimmy Picket, MD  amphetamine-dextroamphetamine (ADDERALL XR) 20 MG 24 hr capsule Take 1 capsule (20 mg total) by mouth daily. 06/27/19  Yes Sater, Nanine Means, MD  apixaban (ELIQUIS) 5 MG TABS tablet Take 1 tablet (5 mg total) by mouth 2 (two) times daily. 09/26/17  Yes Kayleen Memos, DO   baclofen (LIORESAL) 20 MG tablet Take 1 tablet (20 mg total) by mouth 4 (four) times daily. 07/25/18  Yes Sater, Nanine Means, MD  busPIRone (BUSPAR) 15 MG tablet Take 1 tablet (15 mg total) by mouth 2 (two) times daily. Patient taking differently: Take 15 mg by mouth daily.  06/27/19  Yes Sater, Nanine Means, MD  citalopram (CELEXA) 20 MG tablet Take 1 tablet (20 mg total) by mouth daily. 02/19/19  Yes Sater, Nanine Means, MD  dalfampridine 10 MG TB12 Take 1 tablet (10  mg total) by mouth 2 (two) times daily. 06/24/19  Yes Sater, Nanine Means, MD  gabapentin (NEURONTIN) 800 MG tablet Take 1 tablet (800 mg total) by mouth 4 (four) times daily. 06/27/19  Yes Sater, Nanine Means, MD  lamoTRIgine (LAMICTAL) 200 MG tablet Take 1 tablet (200 mg total) by mouth 2 (two) times daily. 06/27/19  Yes Sater, Nanine Means, MD  metoprolol succinate (TOPROL-XL) 25 MG 24 hr tablet TAKE 1 TABLET BY MOUTH DAILY 08/25/19  Yes Sater, Nanine Means, MD  norethindrone (MICRONOR) 0.35 MG tablet Take 1 tablet (0.35 mg total) by mouth daily. 11/19/18  Yes Clarnce Flock, MD  oxyCODONE-acetaminophen (PERCOCET) 10-325 MG tablet Take 1 tablet by mouth every 4 (four) hours. 03/18/18  Yes [provider]  Ozanimod HCl (ZEPOSIA) 0.92 MG CAPS Take 0.92 mg by mouth in the morning and at bedtime.    Yes [provider]  pantoprazole (PROTONIX) 20 MG tablet Take 1 tablet every morning at least 30 minutes before first dose of Carafate. 07/19/19  Yes Molpus, Jenny Reichmann, MD  predniSONE (DELTASONE) 50 MG tablet Take 10 pills po qd x 2 days for MS exacerbation Patient taking differently: Take 50 mg by mouth daily with breakfast.  12/27/18  Yes Sater, Nanine Means, MD  sucralfate (CARAFATE) 1 g tablet Take 1 tablet (1 g total) by mouth 4 (four) times daily -  with meals and at bedtime. 07/19/19  Yes Molpus, John, MD  SUMAtriptan (IMITREX) 100 MG tablet Take 1 tablet (100 mg total) by mouth once as needed for up to 1 dose for migraine. May repeat in 2 hours if  headache persists or recurs. 02/19/19  Yes Sater, Nanine Means, MD  tamsulosin (FLOMAX) 0.4 MG CAPS capsule Take 1 capsule (0.4 mg total) by mouth daily. 06/27/19  Yes Sater, Nanine Means, MD  tiZANidine (ZANAFLEX) 4 MG tablet TAKE 1 TABLET BY MOUTH EVERY NIGHT AT BEDTIME 07/28/19  Yes Sater, Nanine Means, MD  methylPREDNISolone (MEDROL DOSEPAK) 4 MG TBPK tablet Take by mouth. Patient not taking: Reported on 09/01/2019 08/19/19   [provider]  methylPREDNISolone (MEDROL) 4 MG tablet Taper from 6 pills po for one day to 1 pill po the last day over 6 days Patient not taking: Reported on 09/01/2019 08/01/19   Britt Bottom, MD  Multiple Vitamin (MULTIVITAMIN WITH MINERALS) TABS tablet Take 1 tablet by mouth daily. Patient not taking: Reported on 09/01/2019 09/20/17   Kayleen Memos, DO  Oxcarbazepine (TRILEPTAL) 300 MG tablet Take 1 tablet (300 mg total) by mouth 2 (two) times daily. Patient not taking: Reported on 07/19/2019 11/11/15 07/19/19  Britt Bottom, MD    Allergies    Ace inhibitors, Amoxicillin, Celexa [citalopram], and Lisinopril  Review of Systems   Review of Systems  Constitutional: Negative for chills and fever.  HENT: Negative for congestion, sore throat and tinnitus.   Eyes: Negative for visual disturbance.  Respiratory: Negative for cough and shortness of breath.   Cardiovascular: Negative for chest pain, palpitations and leg swelling.  Gastrointestinal: Negative for abdominal pain, diarrhea, nausea and vomiting.  Genitourinary: Negative for enuresis, flank pain and frequency.  Musculoskeletal: Positive for gait problem. Negative for back pain and joint swelling.       Admits to having difficulty walking due to left leg weakness.  Skin: Negative for rash.  Neurological: Positive for weakness. Negative for dizziness.  Hematological: Does not bruise/bleed easily.    Physical Exam Updated Vital Signs BP 124/81   Pulse 100  Temp 97.7 F (36.5 C) (Oral)   Resp 20   Ht  5\' 6"  (1.676 m)   Wt 106.6 kg   LMP 09/01/2019   SpO2 99%   BMI 37.93 kg/m   Physical Exam Vitals and nursing note reviewed.  Constitutional:      General: She is not in acute distress.    Appearance: Normal appearance. She is not ill-appearing or diaphoretic.  HENT:     Head: Normocephalic and atraumatic.     Nose: No congestion or rhinorrhea.     Mouth/Throat:     Mouth: Mucous membranes are moist.     Pharynx: Oropharynx is clear.  Eyes:     General: No visual field deficit or scleral icterus.       Right eye: No discharge.        Left eye: No discharge.     Extraocular Movements: Extraocular movements intact.     Conjunctiva/sclera: Conjunctivae normal.     Pupils: Pupils are equal, round, and reactive to light.  Cardiovascular:     Rate and Rhythm: Normal rate and regular rhythm.     Pulses: Normal pulses.     Heart sounds: No murmur heard.  No friction rub. No gallop.   Pulmonary:     Effort: Pulmonary effort is normal. No respiratory distress.     Breath sounds: No wheezing, rhonchi or rales.  Abdominal:     General: There is no distension.     Palpations: Abdomen is soft.     Tenderness: There is no abdominal tenderness. There is no guarding.  Musculoskeletal:        General: No swelling, tenderness, deformity or signs of injury.     Right lower leg: No edema.     Left lower leg: No edema.  Skin:    General: Skin is warm and dry.     Capillary Refill: Capillary refill takes less than 2 seconds.     Findings: No rash.     Comments: Patient has some previous lacerations that healing bilaterally on the lower part of her legs.  Patient states is from previous falls falls  Neurological:     General: No focal deficit present.     Mental Status: She is alert and oriented to person, place, and time.     GCS: GCS eye subscore is 4. GCS verbal subscore is 5. GCS motor subscore is 6.     Cranial Nerves: Cranial nerves are intact. No cranial nerve deficit or facial  asymmetry.     Sensory: Sensation is intact. No sensory deficit.     Motor: Weakness present. No pronator drift.     Coordination: Romberg sign negative. Heel to [facility] Test abnormal. Finger-Nose-Finger Test normal.     Comments: Patient had weakness noted left leg, 3 out of 5 strength, decreased  range of motion with active movement due to decreased strength, she had full range of motion with passive movement.  Psychiatric:        Mood and Affect: Mood normal.     ED Results / Procedures / Treatments   Labs (all labs ordered are listed, but only abnormal results are displayed) Labs Reviewed  COMPREHENSIVE METABOLIC PANEL - Abnormal; Notable for the following components:      Result Value   Creatinine, Ser 1.64 (*)    GFR calc non Af Amer 38 (*)    GFR calc Af Amer 45 (*)    All other components within normal limits  CBC WITH  DIFFERENTIAL/PLATELET - Abnormal; Notable for the following components:   Lymphs Abs 0.4 (*)    Abs Immature Granulocytes 0.12 (*)    All other components within normal limits  URINALYSIS, ROUTINE W REFLEX MICROSCOPIC - Abnormal; Notable for the following components:   Color, Urine YELLOW (*)    APPearance CLOUDY (*)    Hgb urine dipstick LARGE (*)    Protein, ur 30 (*)    Leukocytes,Ua SMALL (*)    Bacteria, UA RARE (*)    All other components within normal limits  HCG, QUANTITATIVE, PREGNANCY    EKG EKG Interpretation  Date/Time:  Sunday September 01 2019 08:28:13 EDT Ventricular Rate:  93 PR Interval:    QRS Duration: 95 QT Interval:  364 QTC Calculation: 453 R Axis:   12 Text Interpretation: Sinus rhythm Borderline T abnormalities, inferior leads since last tracing no significant change Confirmed by Daleen Bo [phone]) on 09/01/2019 9:09:49 AM   Radiology CT Head Wo Contrast  Result Date: 09/01/2019 CLINICAL DATA:  Left-sided weakness EXAM: CT HEAD WITHOUT CONTRAST TECHNIQUE: Contiguous axial images were obtained from the base of the skull  through the vertex without intravenous contrast. COMPARISON:  10/03/2017 FINDINGS: Brain: There is no mass, hemorrhage or extra-axial collection. The size and configuration of the ventricles and extra-axial CSF spaces are normal. The brain parenchyma is normal, without acute or chronic infarction. Vascular: No abnormal hyperdensity of the major intracranial arteries or dural venous sinuses. No intracranial atherosclerosis. Skull: The visualized skull base, calvarium and extracranial soft tissues are normal. Sinuses/Orbits: No fluid levels or advanced mucosal thickening of the visualized paranasal sinuses. No mastoid or middle ear effusion. The orbits are normal. IMPRESSION: Normal head CT. Electronically Signed   By: Ulyses Jarred M.D.   On: 09/01/2019 05:50    Procedures Procedures (including critical care time)  Medications Ordered in ED Medications  gadobutrol (GADAVIST) 1 MMOL/ML injection 10 mL (has no administration in time range)  LORazepam (ATIVAN) injection 1 mg (1 mg Intravenous Given 09/01/19 1152)    ED Course  I have reviewed the triage vital signs and the nursing notes.  Pertinent labs & imaging results that were available during my care of the patient were reviewed by me and considered in my medical decision making (see chart for details).    MDM Rules/Calculators/A&P                          I have personally reviewed all imaging, labs and have interpreted them.  Due to patient's complaint most concern for CVA versus infection versus metabolic versus MS exacerbation.  Unlikely patient is suffering from intracranial bleed or intracranial mass mass as CT head did not show any acute abnormalities, no lesions, fractures or edema noted.  Unlikely patient suffering from systemic infection as patient appears nontoxic, vitals reassuring, CBC did not show leukocytosis. Unlikely patient suffering from a metabolic abnormality as CMP does not show any electrode abnormalities, no elevated  AST or ALT but had elevated creatinine which appears to be at baseline for patient.  UA did show small leukocytes, red blood cells white blood cells rare bacteria.  Patient is not complaining of any urinary symptoms, unlikely patient is suffering from a UTI will not treat with antibiotics but will send send out urine cultures. Consulted neurologist hospitalist spoke with Dr. Malen Gauze who recommends that patient should have MRI of brain with and without +  C-spine to look for any new lesions.  If  no acute abnormalities can be sent home with steroids burst, if new lesions present she should be admitted for further evaluation.   Patient will be handed to [facility] due to shift change.  Likely patient suffering from MS exacerbation and can be discharged if no new lesion seen on MRI. will send home with prednisone burst.  If new lesions present will be admitted for further evaluation. Final Clinical Impression(s) / ED Diagnoses Final diagnoses:  Left leg weakness  MS (multiple sclerosis) [facility])    Rx / DC Orders ED Discharge Orders    None       Marcello Fennel, PA-C 09/01/19 1837    Daleen Bo, MD 09/04/19 [phone]

## 2019-09-01 NOTE — ED Provider Notes (Signed)
°  Face-to-face evaluation   History: She presents for evaluation ongoing weakness for several weeks, treated with oral steroids by her neurologist about a month ago.  She complains of only left leg weakness that makes it hard to walk.  Physical exam: Alert, somewhat anxious.  Left leg, unable to lift off stretcher or bend knee.  Normal range of motion right leg and both arms.  Medical screening examination/treatment/procedure(s) were conducted as a shared visit with non-physician practitioner(s) and myself.  I personally evaluated the patient during the encounter    Daleen Bo, MD 09/04/19 [phone]

## 2019-09-01 NOTE — ED Notes (Signed)
Pt to restroom via wheelchair. Pt will provide UA sample. Pt also requested maxipad

## 2019-09-01 NOTE — Discharge Instructions (Signed)
Your MRIs today were overall reassuring.  Please take the prescribed steroids as directed until finished.  Please make sure to follow-up with your neurologist for the symptoms that you are experiencing today.  Return to the ER if your symptoms worsen.

## 2019-09-02 ENCOUNTER — Telehealth: Payer: Self-pay | Admitting: Neurology

## 2019-09-02 NOTE — Telephone Encounter (Signed)
I called pt and advised her of this. I will let Raquel Sarna know to cancel the pt's MRIs for 09/05/2019. Pt verbalized understanding of results.

## 2019-09-02 NOTE — Telephone Encounter (Signed)
Pt is asking for a call re: where her medical equipment was called into.  Please call

## 2019-09-02 NOTE — Telephone Encounter (Signed)
I called pt. I advised her that it appears Terrence Dupont, RN sent the order for a wheelchair to [place]. I provided pt with their phone number.  Pt also reports that she was in the hospital yesterday and had an MRI brain and cervical. She is asking if Dr. Felecia Shelling will review those results and let her know if she needs to keep her MRIs scheduled for 09/05/2019.

## 2019-09-02 NOTE — Telephone Encounter (Signed)
Please let her know that I reviewed the MRI of the cervical spine and brain.  She does not have any new lesions.

## 2019-09-03 ENCOUNTER — Encounter: Payer: Self-pay | Admitting: Neurology

## 2019-09-03 NOTE — Telephone Encounter (Signed)
Noted, I spoke with Larene Beach at [facility] imaging and canceled her appt for 09/05/19.

## 2019-09-04 ENCOUNTER — Telehealth: Payer: Self-pay | Admitting: *Deleted

## 2019-09-04 NOTE — Telephone Encounter (Signed)
Called pt to let her know Dr. Krista Blue reviewed her monthly lemtrada labs from 09/03/19. Creatinine 1.76 (ref range 0.57-1.00). We reviewed her chart and her level has been trending up. She sees [facility] and will call them today to follow up with them. Reminded her to set up next home draw appt end of July. She verbalized understanding.

## 2019-09-11 NOTE — Telephone Encounter (Signed)
Error

## 2019-09-14 ENCOUNTER — Other Ambulatory Visit: Payer: Self-pay | Admitting: Neurology

## 2019-09-15 ENCOUNTER — Other Ambulatory Visit: Payer: Self-pay | Admitting: Neurology

## 2019-09-16 ENCOUNTER — Telehealth: Payer: Self-pay | Admitting: Neurology

## 2019-09-16 ENCOUNTER — Other Ambulatory Visit: Payer: Self-pay | Admitting: *Deleted

## 2019-09-16 MED ORDER — BACLOFEN 20 MG PO TABS: 20.0000 mg | ORAL_TABLET | Freq: Four times a day (QID) | ORAL | 3 refills | Status: DC | PRN

## 2019-09-16 MED ORDER — AMPHETAMINE-DEXTROAMPHET ER 20 MG PO CP24: 20.0000 mg | ORAL_CAPSULE | Freq: Every day | ORAL | 0 refills | Status: DC

## 2019-09-16 NOTE — Addendum Note (Signed)
Addended by: Wyvonnia Lora on: 09/16/2019 11:29 AM   Modules accepted: Orders

## 2019-09-16 NOTE — Telephone Encounter (Signed)
Called pt again. Was able to speak with her. She will come 09/18/19 at 2:30pm for solumedrol 1G IVx1 day. Gave signed orders to [facility]. She requested refill on adderall as well. Advised I will send request to MD. She verbalized understanding Checked drug registry. She last refilled adderall 06/27/19 #30. Last seen 06/27/19 and next f/u 12/30/19

## 2019-09-16 NOTE — Telephone Encounter (Signed)
Tried calling pt back, mailbox full and could not leave message. Sent SMS notification to pt.

## 2019-09-16 NOTE — Telephone Encounter (Signed)
Pt is asking for a call from RN to discuss going back on IV Steroids.

## 2019-09-16 NOTE — Addendum Note (Signed)
Addended by: Arlice Colt A on: 09/16/2019 12:53 PM   Modules accepted: Orders

## 2019-09-17 ENCOUNTER — Telehealth: Payer: Self-pay | Admitting: Neurology

## 2019-09-17 NOTE — Telephone Encounter (Signed)
Called patient back and advised DME [employer] and can contact them at [phone]. She verbalized understanding and appreciation.

## 2019-09-17 NOTE — Telephone Encounter (Signed)
Pt is asking for a call re: which DME she has been assigned to

## 2019-10-10 ENCOUNTER — Telehealth: Payer: Self-pay | Admitting: Neurology

## 2019-10-10 NOTE — Telephone Encounter (Signed)
Pt is asking for a call with the results to her labwork done on 07-28

## 2019-10-10 NOTE — Telephone Encounter (Signed)
Called and spoke with pt. Advised per Dr. Felecia Shelling that labs were fine. Pt verbalized understanding.

## 2019-11-04 ENCOUNTER — Encounter: Payer: Self-pay | Admitting: Neurology

## 2019-11-13 ENCOUNTER — Telehealth: Payer: Self-pay | Admitting: *Deleted

## 2019-11-13 NOTE — Telephone Encounter (Signed)
Noted  

## 2019-11-13 NOTE — Telephone Encounter (Signed)
Tried calling pt but her mailbox was full. We received fax from [facility] that they have been unable to reach her to schedule delivery of her Zeposia. She needs to call them back at [phone]. I sent her SMS notification. I also sent mychart message.

## 2019-11-13 NOTE — Telephone Encounter (Signed)
Pt called back, and she states she will call Zeposia.  This is FYI, no call back requested.

## 2019-11-14 ENCOUNTER — Telehealth: Payer: Self-pay | Admitting: Neurology

## 2019-11-14 DIAGNOSIS — R269 Unspecified abnormalities of gait and mobility: Secondary | ICD-10-CM

## 2019-11-14 DIAGNOSIS — G35 Multiple sclerosis: Secondary | ICD-10-CM

## 2019-11-14 NOTE — Telephone Encounter (Signed)
Pt called would like to discuss referral for physical therapy with [facility]. Would like a call back from the nurse.

## 2019-11-14 NOTE — Telephone Encounter (Signed)
Called and spoke with pt. She is requesting PT referral to [facility]. Per Dr. Felecia Shelling, ok to place. I placed referral.   She is also requesting refill on her adderall. She was last seen 06/27/19 and next f/u is 12/30/19.  Checked drug registry. She last refilled 09/16/19 #30

## 2019-11-19 ENCOUNTER — Ambulatory Visit: Payer: Medicare Other | Attending: Neurology | Admitting: Physical Therapy

## 2019-11-19 ENCOUNTER — Encounter: Payer: Self-pay | Admitting: Physical Therapy

## 2019-11-19 ENCOUNTER — Other Ambulatory Visit: Payer: Self-pay

## 2019-11-19 DIAGNOSIS — R2689 Other abnormalities of gait and mobility: Secondary | ICD-10-CM | POA: Insufficient documentation

## 2019-11-19 DIAGNOSIS — R296 Repeated falls: Secondary | ICD-10-CM | POA: Diagnosis present

## 2019-11-19 DIAGNOSIS — M6281 Muscle weakness (generalized): Secondary | ICD-10-CM | POA: Insufficient documentation

## 2019-11-19 DIAGNOSIS — G8929 Other chronic pain: Secondary | ICD-10-CM | POA: Diagnosis present

## 2019-11-19 DIAGNOSIS — M545 Low back pain: Secondary | ICD-10-CM | POA: Diagnosis present

## 2019-11-19 NOTE — Therapy (Signed)
[place], [place], [address] Phone: [phone]   Fax:  [phone]  Physical Therapy Evaluation  Patient Details  Name: Dawn Peters MRN: [id number] Date of Birth: 07-Sep-1980 Referring Provider (PT): Britt Bottom MD   Encounter Date: 11/19/2019   PT End of Session - 11/19/19 1723    Visit Number 1    Number of Visits 17    Date for PT Re-Evaluation 01/14/20    Authorization Type [employer] MCR    Progress Note Due on Visit 10    PT Start Time 1555    PT Stop Time 1655    PT Time Calculation (min) 60 min    Activity Tolerance Patient tolerated treatment well    Behavior During Therapy [facility] for tasks assessed/performed           Past Medical History:  Diagnosis Date  . Anticoagulant long-term use    Eliquis  . Anxiety   . Chronic pain   . CKD (chronic kidney disease), stage III   . Depression   . DVT, lower extremity, recurrent ([place]) 09/17/2017   left lower extremity-- treated with eliquis  . Gait disturbance    due to MS  . GERD (gastroesophageal reflux disease)    watch diet  . 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 History of DVT of lower extremity 2007  . History of encephalopathy 38/2505   acute metabolic encephalopathy secondary to MS,  resolved  . History of MRSA infection 2004   right hip infection post THA  . History of pulmonary embolus (PE) 2005  . Hydronephrosis, left    w/ acute kidney injury 02/ 2019  . Hypertension   . Left-sided weakness    due to MS  . Migraines   . MS (multiple sclerosis) [facility]) neurologist-  dr Renne Musca   dx 2000--  . Muscle spasticity   . Neurogenic bladder    due to MS  . Pulmonary embolism, bilateral ([place]) 09/17/2017   treated w/ eliquis  . Strains to urinate   . Urgency of urination     Past Surgical History:  Procedure Laterality Date  . CYSTOSCOPY WITH RETROGRADE  PYELOGRAM, URETEROSCOPY AND STENT PLACEMENT Bilateral 11/29/2017   Procedure: BILATERAL  RETROGRADE PYELOGRAM, LEFT DIAGNOSIC URETEROSCOPY AND STENT LEFT PLACEMENT;  Surgeon: Ardis Hughs, MD;  Location: [facility];  Service: Urology;  Laterality: Bilateral;  . HIP ARTHROSCOPY Left 07-05-2013   dr pill @[facility]    iliopsosas release, synovectomy  . REVISION TOTAL HIP ARTHROPLASTY Right 03-28-2003    dr Alvan Dame @[facility]   . TOTAL HIP ARTHROPLASTY Bilateral left 11-05-2002  dr Alvan Dame @[facility] ;   right 06/ 2004  @[facility]     There were no vitals filed for this visit.    Subjective Assessment - 11/19/19 1610    Subjective I have been not walking well for about 2 months and I have been taking prednisone and then I do fine but I have not been doing as well this time on it.  I use my walker most of the time.  I want to be able to get up and go downtown like I used to . I was in [facility] last year for 3 months because of my MS exacerbation.    Pertinent History BIl THR,  ALLERGIC lisinopril, amoxicillin,  MS,    Limitations Standing;Walking;House hold activities    How long  can you sit comfortably? unlimited    How long can you stand comfortably? < 10 min    How long can you walk comfortably? < 5 minutes    Patient Stated Goals I want to strengthen my LT side and be able to walk on my own.  I can t do anything now.  I want to help my back pain  while I use a walker. It hurts when I lean over    Currently in Pain? Yes    Pain Score 8     Pain Location Back   when I am standing trying to walk   Pain Orientation Lower;Right;Left;Mid    Pain Descriptors / Indicators Aching;Throbbing    Pain Type Chronic pain    Pain Onset More than a month ago    Pain Frequency Constant    Aggravating Factors  standing walking  sometimes when I sleep and I try to turn over    Pain Relieving Factors pain medication    Multiple Pain Sites Yes    Pain Score 6    Pain Location Knee    Pain Orientation Left     Pain Descriptors / Indicators Aching    Pain Type Chronic pain    Pain Onset More than a month ago    Pain Frequency Constant    Aggravating Factors  walking    Pain Score 8    Pain Location Hip   bursitis and gets injections.   Pain Orientation Right;Left    Pain Descriptors / Indicators Aching    Pain Type Chronic pain    Pain Onset More than a month ago    Pain Frequency Intermittent    Aggravating Factors  walking/ standing              [facility] PT Assessment - 11/19/19 0001      Assessment   Medical Diagnosis Multiple Sclerosis,  Gait disturbance    Referring Provider (PT) Arlice Colt, A MD    Onset Date/Surgical Date 09/09/19   recent exacerbation of MS   Hand Dominance Right    Next MD Visit october 2021    Prior Therapy last year at [facility]  3 months in house      Precautions   Precautions Fall    Other Brace/Splint AFO LT foot because she has gained wt and cannot use old AFO      Restrictions   Weight Bearing Restrictions No      Balance Screen   Has the patient fallen in the past 6 months Yes    How many times? 10    Has the patient had a decrease in activity level because of a fear of falling?  Yes    Is the patient reluctant to leave their home because of a fear of falling?  Yes      [place] Private residence    Living Arrangements Spouse/significant other    Available Help at Discharge Family    Type of [place] One level    Additional Comments not handicapped accessible      Prior Function   Level of Independence Requires assistive device for independence;Needs assistance with ADLs;Needs assistance with homemaking;Needs assistance with gait      Cognition   Overall Cognitive Status Within Functional Limits for tasks assessed      Observation/Other Assessments   Focus on Therapeutic Outcomes (FOTO)  intake 33%  limitation 33%  predicted 26%  Functional Tests   Functional tests Squat       Squat   Comments must use bil UE hold at sink and chair in back for safety      ROM / Strength   AROM / PROM / Strength AROM;Strength      AROM   Overall AROM  Deficits    Right Hip Flexion 120    Left Hip Flexion 110    Right Ankle Dorsiflexion 6    Left Ankle Dorsiflexion -12   clonus 4+ beat     Strength   Right Hip Flexion 4/5    Right Hip Extension 4-/5    Right Hip ABduction 4-/5    Left Hip Flexion 3-/5    Left Hip Extension 3-/5    Left Hip ABduction 2/5    Right Knee Flexion 4/5    Right Knee Extension 4/5    Left Knee Flexion 3+/5    Left Knee Extension 3-/5    Right Ankle Dorsiflexion 4/5    Left Ankle Dorsiflexion 3-/5   impeded due to clonus and -12 DF     Transfers   Transfers Sit to Stand;Supine to Sit;Sit to Supine    Sit to Stand 4: Min assist    Sit to Stand Details Other (comment)    Sit to Stand Details (indicate cue type and reason) --   must use UE to initiate standing   Five time sit to stand comments  unable to perform  only could stand one time with UE support    Comments Pt LT knee buckles with ambulation on walker.        Ambulation/Gait   Ambulation/Gait Yes    Assistive device Rolling walker    Gait Pattern Step-to pattern;Decreased hip/knee flexion - left;Decreased dorsiflexion - left;Left foot flat;Poor foot clearance - left    Ambulation Surface Level    Gait Comments Pt with clonus grate 4+                Access Code: [email or web address]: [email or web address]: 09/14/2021Prepared by: Donnetta Simpers BeardsleyExercises  Seated Calf Stretch with Strap - 2 x daily - 7 x weekly - 1 sets - 3 reps - 30 hold  Sit to Stand with Counter Support - 3 x daily - 7 x weekly - 1 sets - 5-10 reps  Supine Lower Trunk Rotation - 2 x daily - 7 x weekly - 1 sets - 3-5 reps - 20-30 hold  Seated Flexion Stretch - 2 x daily - 7 x weekly - 1 sets - 5-10 reps - 10 hold       Objective measurements completed on examination: See above findings.                   PT Short Term Goals - 11/19/19 1636      PT SHORT TERM GOAL #1   Title Pt will be able to perform 5 x STS    Baseline Unable to perform 5 x STS at evaluation 11-19-19    Time 4    Period Weeks    Status New    Target Date 12/17/19      PT SHORT TERM GOAL #2   Title Patient will increase gross bilateral lower extremity strength to at least 4/5    Baseline see flowsheet    Time 4    Period Weeks    Status New    Target Date 12/17/19      PT SHORT TERM GOAL #3   Title  Patient will be independent with initial HEP     Baseline given intiial 11-19-19    Time 4    Period Weeks    Status New    Target Date 12/17/19             PT Long Term Goals - 11/19/19 1720      PT LONG TERM GOAL #1   Title LTG to be determined by Neuro Rehab PT    Time 8    Period Weeks    Status New    Target Date 01/14/20      PT LONG TERM GOAL #2   Title Pt will be evaluated for bracing to enhance ambulation ability due to AFO no longer fitting for foot drop on LT    Time 8    Period Weeks    Status New    Target Date 01/14/20      PT LONG TERM GOAL #3   Title Patient will be independent with final HEP.     Baseline Pt with MS exacerbation and needs current HEP to maximize function    Time 8    Period Weeks    Status New    Target Date 01/14/20                  Plan - 11/19/19 1725    Clinical Impression Statement Ms Kinnan , well known to PT presents today with  MS relapsed remitting exacerbation and subsequwnt weakness.  Pt has gained weight since AFO given and she states she no longer fits of Left foot drop. Pt has been having  continuing weakness for last 2  months. she also reports having been in [facility] for 3 months last year. Her exacerbation resulted in significant Lt LE weakness that limits functional ability and creates difficulties with ambulation along LT LE biomechanical chain. . Increased pain in low back due to sedentary lifetyle. and  instability when walking.  Ms Lannan also reports to falling 10 x and was encouraged to use only walker and assistance.  Pt also would benefit from W/C for community engagement. Ms Piggott has -12 DF and drop foot but is able to demo activation of DF  3-/5 MMT  , is limited by significant tightness of gastroc/soleus  and 4+ clonus.  Pt would benefit from 2 x a week for 8 weeks and will be best served at neuro rehabilitation and will benefit from being transferred from [address] clinic.    Personal Factors and Comorbidities Comorbidity 1;Comorbidity 2    Comorbidities bil THR  and one revision , MS exacerbation.  Low back pain due to immobility  See Medical flow chart    Examination-Activity Limitations Bed Mobility;Locomotion Level;Squat;Stairs;Stand;Transfers    Stability/Clinical Decision Making Evolving/Moderate complexity    Clinical Decision Making Moderate    Rehab Potential Good    PT Frequency 2x / week    PT Duration 8 weeks    PT Treatment/Interventions ADLs/Self Care Home Management;Aquatic Therapy;Electrical Stimulation;Moist Heat;Balance training;Therapeutic exercise;Therapeutic activities;Functional mobility training;Stair training;Gait training;Neuromuscular re-education;Patient/family education;Manual techniques;Passive range of motion;Dry needling;Taping    PT Next Visit Plan transfer to Neuro for assessment of bracing and gait training    PT Home Exercise Plan [facility]    Consulted and Agree with Plan of Care Patient           Patient will benefit from skilled therapeutic intervention in order to improve the following deficits and impairments:  Abnormal gait, Decreased activity tolerance, Decreased balance, Decreased  coordination, Decreased endurance, Decreased range of motion, Decreased strength, Difficulty walking, Increased muscle spasms, Pain, Decreased knowledge of use of DME, Decreased mobility  Visit Diagnosis: Other abnormalities of gait and mobility  Repeated  falls  Muscle weakness (generalized)  Chronic bilateral low back pain without sciatica     Problem List Patient Active Problem List   Diagnosis Date Noted  . Amenorrhea 11/20/2018  . Well woman exam 11/20/2018  . MS (multiple sclerosis) ([place]) 03/14/2018  . CKD (chronic kidney disease), stage III   . Obesity (BMI 30.0-34.9)   . Anemia 01/09/2018  . Pulmonary embolism ([place]) 09/18/2017  . Elevated serum creatinine 05/12/2017  . Acute encephalopathy 04/20/2017  . Hydronephrosis of left kidney 10/24/2016  . Attention deficit 10/24/2016  . Left leg weakness 08/13/2016  . Chronic pain 08/13/2016  . Mild renal insufficiency 08/13/2016  . Left-sided weakness   . High risk medication use 09/15/2015  . Hip pain, bilateral 07/28/2015  . Urinary hesitancy 07/28/2015  . Multiple sclerosis exacerbation ([place]) 07/17/2015  . Urinary disorder 06/11/2015  . Gait disturbance 05/19/2015  . Numbness 05/19/2015  . Depression with anxiety 05/13/2015  . Other fatigue 05/13/2015  . Left knee pain 05/13/2015  . Avascular necrosis of bones of both hips ([place]) 05/13/2015  . Screening for HIV (human immunodeficiency virus) 07/08/2014  . Essential hypertension, benign 11/19/2013  . Anxiety state, unspecified 11/19/2013  . Screen for STD (sexually transmitted disease) 11/01/2013  . Encounter for routine gynecological examination 11/01/2013  . Oral contraceptive use 10/20/2011  . Multiple sclerosis ([place]) 08/08/2007  . RASH AND OTHER NONSPECIFIC SKIN ERUPTION 06/22/2007  . AVASCULAR NECROSIS, FEMORAL HEAD 03/11/2006  . Essential hypertension 02/03/2006  . 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 DVT, HX OF 02/03/2006   Voncille Lo, PT, [place] Certified Exercise Expert for the Aging Adult  11/19/19 5:43 PM Phone: [phone] Fax: [place] [facility] [address] [place], [place], [address] Phone: [phone]   Fax:  [phone]  Name:  Rosi Secrist MRN: [id number] Date of Birth: Apr 28, 1980

## 2019-11-19 NOTE — Patient Instructions (Signed)
   Voncille Lo, PT, [place] Certified Exercise Expert for the Aging Adult  11/19/19 4:55 PM Phone: [phone] Fax: [phone]

## 2019-11-20 ENCOUNTER — Telehealth: Payer: Self-pay | Admitting: Neurology

## 2019-11-20 ENCOUNTER — Other Ambulatory Visit: Payer: Self-pay | Admitting: Neurology

## 2019-11-20 ENCOUNTER — Telehealth: Payer: Self-pay

## 2019-11-20 ENCOUNTER — Encounter: Payer: Self-pay | Admitting: *Deleted

## 2019-11-20 MED ORDER — PREDNISONE 10 MG PO TABS
ORAL_TABLET | ORAL | 0 refills | Status: DC
Start: 2019-11-20 — End: 2020-11-27

## 2019-11-20 MED ORDER — AMPHETAMINE-DEXTROAMPHET ER 20 MG PO CP24: 20.0000 mg | ORAL_CAPSULE | Freq: Every day | ORAL | 0 refills | Status: DC

## 2019-11-20 NOTE — Telephone Encounter (Signed)
Pt has called to report that she has been informed by- [facility] that she is out of refills on her Ozanimod HCl (ZEPOSIA) 0.92 MG CAPS.  Pt is also asking if because of her left side hurting can she get a prescription called in for Prednisone, please call.

## 2019-11-20 NOTE — Telephone Encounter (Signed)
Called patient, advised her of two Rx sent in. I advised she call [facility] back and schedule shipment; she should have refills until Dec 2021. She stated she talked to them today and was told she needs refill and she has two different accounts.  I advised that because her insurance card has a hyphenated last name and her name in EMR isn't hyphenated it can cause issues. I noted to her that one person I spoke with couldn't find her in system. I advised she call and have them combine her accounts into one, ask for shipment to be sent .  I gave her #; she stated she would call right away, verbalized understanding, appreciation.

## 2019-11-20 NOTE — Addendum Note (Signed)
Addended by: Arlice Colt A on: 11/20/2019 05:06 PM   Modules accepted: Orders

## 2019-11-20 NOTE — Telephone Encounter (Addendum)
Called patient who stated she took last Zeposia today. She stated that Terrence Dupont RN called her about calling to schedule Zeposia shipment, but she "gets so many spam calls". She also stated she needs adderall refill.  I advised will let Dr Felecia Shelling know she requests adderall and prednisone Rx.  [facility], [phone], unable to hold beyond 30 minutes. Per Terrence Dupont RN's note on 9/8 I sent my chart and advised she call [facility] to schedule delivery of Zeposia at [phone].

## 2019-11-20 NOTE — Telephone Encounter (Signed)
Lab results received and given to Dr. Felecia Shelling to review.

## 2019-11-20 NOTE — Telephone Encounter (Signed)
I sent the prescriptions to [facility].

## 2019-11-25 ENCOUNTER — Ambulatory Visit: Payer: Medicare Other | Admitting: Rehabilitation

## 2019-11-25 ENCOUNTER — Ambulatory Visit (HOSPITAL_COMMUNITY)
Admission: EM | Admit: 2019-11-25 | Discharge: 2019-11-25 | Disposition: A | Discharge status: Home / Self Care | Payer: Medicare Other | Attending: Internal Medicine | Admitting: Internal Medicine

## 2019-11-25 ENCOUNTER — Other Ambulatory Visit: Payer: Self-pay

## 2019-11-25 DIAGNOSIS — Z20822 Contact with and (suspected) exposure to covid-19: Secondary | ICD-10-CM | POA: Diagnosis not present

## 2019-11-25 NOTE — ED Triage Notes (Signed)
Nurse visit. Pt wants COVID test for COVID exposure. Pt denies sx at this time.

## 2019-11-26 LAB — SARS CORONAVIRUS 2 (TAT 6-24 HRS): SARS Coronavirus 2: NEGATIVE

## 2019-12-02 ENCOUNTER — Other Ambulatory Visit: Payer: Self-pay

## 2019-12-02 ENCOUNTER — Encounter: Payer: Self-pay | Admitting: Rehabilitation

## 2019-12-02 ENCOUNTER — Telehealth: Payer: Self-pay | Admitting: Rehabilitation

## 2019-12-02 ENCOUNTER — Ambulatory Visit: Payer: Medicare Other | Admitting: Rehabilitation

## 2019-12-02 DIAGNOSIS — R269 Unspecified abnormalities of gait and mobility: Secondary | ICD-10-CM

## 2019-12-02 DIAGNOSIS — M6281 Muscle weakness (generalized): Secondary | ICD-10-CM

## 2019-12-02 DIAGNOSIS — R2689 Other abnormalities of gait and mobility: Secondary | ICD-10-CM

## 2019-12-02 DIAGNOSIS — G35 Multiple sclerosis: Secondary | ICD-10-CM

## 2019-12-02 NOTE — Telephone Encounter (Signed)
Dr. Felecia Shelling,   I am seeing Dawn Peters here at [facility] for PT.  She has an AFO that was made last year that is no longer appropriate and doesn't fit, therefore needs a new AFO.  Please write order for LLE AFO and we will get her set up with [facility],   Thanks,  Cameron Sprang, PT, MPT [facility] [address] [place], [place], [address] Phone: [phone]   Fax:  [phone] 12/02/19, 4:26 PM

## 2019-12-02 NOTE — Therapy (Signed)
[facility] [address] [place], [place], [address] Phone: [phone]   Fax:  [phone]  Physical Therapy Treatment  Patient Details  Name: Dawn Peters MRN: [id number] Date of Birth: 1980/04/23 Referring Provider (PT): Britt Bottom MD   Encounter Date: 12/02/2019   PT End of Session - 12/02/19 1601    Visit Number 2    Number of Visits 17    Date for PT Re-Evaluation 01/14/20    Authorization Type U[employer] MCR    Progress Note Due on Visit 10    PT Start Time 4742    PT Stop Time 1402    PT Time Calculation (min) 39 min    Activity Tolerance Patient tolerated treatment well    Behavior During Therapy [facility] for tasks assessed/performed           Past Medical History:  Diagnosis Date  . Anticoagulant long-term use    Eliquis  . Anxiety   . Chronic pain   . CKD (chronic kidney disease), stage III   . Depression   . DVT, lower extremity, recurrent ([place]) 09/17/2017   left lower extremity-- treated with eliquis  . Gait disturbance    due to MS  . GERD (gastroesophageal reflux disease)    watch diet  . 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 History of DVT of lower extremity 2007  . History of encephalopathy 59/5638   acute metabolic encephalopathy secondary to MS,  resolved  . History of MRSA infection 2004   right hip infection post THA  . History of pulmonary embolus (PE) 2005  . Hydronephrosis, left    w/ acute kidney injury 02/ 2019  . Hypertension   . Left-sided weakness    due to MS  . Migraines   . MS (multiple sclerosis) [facility]) neurologist-  dr Renne Musca   dx 2000--  . Muscle spasticity   . Neurogenic bladder    due to MS  . Pulmonary embolism, bilateral ([place]) 09/17/2017   treated w/ eliquis  . Strains to urinate   . Urgency of urination     Past Surgical History:  Procedure Laterality Date  . CYSTOSCOPY WITH  RETROGRADE PYELOGRAM, URETEROSCOPY AND STENT PLACEMENT Bilateral 11/29/2017   Procedure: BILATERAL  RETROGRADE PYELOGRAM, LEFT DIAGNOSIC URETEROSCOPY AND STENT LEFT PLACEMENT;  Surgeon: Ardis Hughs, MD;  Location: [facility];  Service: Urology;  Laterality: Bilateral;  . HIP ARTHROSCOPY Left 07-05-2013   dr pill @[facility]    iliopsosas release, synovectomy  . REVISION TOTAL HIP ARTHROPLASTY Right 03-28-2003    dr Alvan Dame @[facility]   . TOTAL HIP ARTHROPLASTY Bilateral left 11-05-2002  dr Alvan Dame @[facility] ;   right 06/ 2004  @[facility]     There were no vitals filed for this visit.   Subjective Assessment - 12/02/19 1326    Subjective I'm using this cane, but I need a 3 point cane.  I also have a 3 wheeled walker that I use for longer distances.    Pertinent History BIl THR,  ALLERGIC lisinopril, amoxicillin,  MS,    Limitations Standing;Walking;House hold activities    Patient Stated Goals I want to strengthen my LT side and be able to walk on my own.  I can t do anything now.  I want to help my back pain  while I use a walker. It hurts when I lean over    Currently in  Pain? Yes    Pain Score 5     Pain Location Leg    Pain Orientation Left    Pain Descriptors / Indicators Aching    Pain Type Chronic pain    Pain Onset More than a month ago    Pain Frequency Constant    Aggravating Factors  walking, WB    Pain Relieving Factors rest                             [facility] Adult PT Treatment/Exercise - 12/02/19 1611      Transfers   Transfers Sit to Stand;Stand to Sit    Sit to Stand 5: Supervision    Sit to Stand Details Verbal cues for sequencing;Verbal cues for technique;Verbal cues for precautions/safety    Sit to Stand Details (indicate cue type and reason) Min cues for forward weight shift     Stand to Sit 5: Supervision    Stand to Sit Details (indicate cue type and reason) Verbal cues for sequencing;Verbal cues for technique;Verbal cues for precautions/safety       Ambulation/Gait   Ambulation/Gait Yes    Ambulation/Gait Assistance 4: Min assist    Ambulation/Gait Assistance Details Min A into clinic with SPC.  Pt demo's wide BOS and tends to kick her cane with R foot causing her to become off balance and also with intermittent L foot drag.  Pt with poor balance reactions needing assist from therapy to steady.  Trialed Ottobock PLS AFO during session with gait x 115' and SPC.  Pt able to make steps more narrow and had better foot clearance with this brace.  Also trialed Ottobock reaction AFO however was difficult to get good assessment due to small shoe, however pt reports she does not like this brace as much as PLS.  Will have Chris from [facility] come to session for formal assessment.      Ambulation Distance (Feet) 80 Feet   115 x 2 reps    Assistive device Straight cane    Gait Pattern Step-to pattern;Decreased hip/knee flexion - left;Decreased dorsiflexion - left;Left foot flat;Poor foot clearance - left    Ambulation Surface Level;Indoor      Exercises   Exercises Other Exercises    Other Exercises  Attempted SL clam exercise, however this was too difficult due to weakness, therefore transitioned to hooklying with yellow band x 10 reps-added to HEP.  Also performed L IT band stretch with strap as she reports pain in L hip due to  bursitis.               Access Code: [facility] URL: [email or web address] Date: 12/02/2019 Prepared by: Cameron Sprang  Exercises Seated Calf Stretch with Strap - 2 x daily - 7 x weekly - 1 sets - 3 reps - 30 hold Sit to Stand with Counter Support - 3 x daily - 7 x weekly - 1 sets - 5-10 reps Supine Lower Trunk Rotation - 2 x daily - 7 x weekly - 1 sets - 3-5 reps - 20-30 hold Seated Flexion Stretch - 2 x daily - 7 x weekly - 1 sets - 5-10 reps - 10 hold Hooklying Clamshell with Resistance - 1 x daily - 7 x weekly - 1 sets - 10 reps Supine ITB Stretch with Strap - 1 x daily - 7 x weekly - 1 sets - 2-3  reps - 30-60 sec hold      PT Education -  12/02/19 1558    Education Details Education on bracing, PT to call [facility] to come to session in future, maybe staying at [address] for low back pain.    Person(s) Educated Patient    Methods Explanation    Comprehension Verbalized understanding            PT Short Term Goals - 11/19/19 1636      PT SHORT TERM GOAL #1   Title Pt will be able to perform 5 x STS    Baseline Unable to perform 5 x STS at evaluation 11-19-19    Time 4    Period Weeks    Status New    Target Date 12/17/19      PT SHORT TERM GOAL #2   Title Patient will increase gross bilateral lower extremity strength to at least 4/5    Baseline see flowsheet    Time 4    Period Weeks    Status New    Target Date 12/17/19      PT SHORT TERM GOAL #3   Title Patient will be independent with initial HEP     Baseline given intiial 11-19-19    Time 4    Period Weeks    Status New    Target Date 12/17/19             PT Long Term Goals - 11/19/19 1720      PT LONG TERM GOAL #1   Title LTG to be determined by Neuro Rehab PT    Time 8    Period Weeks    Status New    Target Date 01/14/20      PT LONG TERM GOAL #2   Title Pt will be evaluated for bracing to enhance ambulation ability due to AFO no longer fitting for foot drop on LT    Time 8    Period Weeks    Status New    Target Date 01/14/20      PT LONG TERM GOAL #3   Title Patient will be independent with final HEP.     Baseline Pt with MS exacerbation and needs current HEP to maximize function    Time 8    Period Weeks    Status New    Target Date 01/14/20                 Plan - 12/02/19 1605    Clinical Impression Statement Skilled session focused on assessment of bracing options for LLE during gait.  Note upon ambulating into session she tends to have wide BOS and L toe intermittently catches with near falls.  Ottobock PLS and reaction were both trialed and she did report liking PLS  better.  Will have Chris from [facility] come to session to formally assess.  Also added hip strenthening to HEP along with ITB stretch due to reported bursitis pain.    Personal Factors and Comorbidities Comorbidity 1;Comorbidity 2    Comorbidities bil THR  and one revision , MS exacerbation.  Low back pain due to immobility  See Medical flow chart    Examination-Activity Limitations Bed Mobility;Locomotion Level;Squat;Stairs;Stand;Transfers    Stability/Clinical Decision Making Evolving/Moderate complexity    Rehab Potential Good    PT Frequency 2x / week    PT Duration 8 weeks    PT Treatment/Interventions ADLs/Self Care Home Management;Aquatic Therapy;Electrical Stimulation;Moist Heat;Balance training;Therapeutic exercise;Therapeutic activities;Functional mobility training;Stair training;Gait training;Neuromuscular re-education;Patient/family education;Manual techniques;Passive range of motion;Dry needling;Taping    PT Next Visit  Plan Can she do 5TSS-update STG, gait speed-add goal, BERG balance-do goal, Continue brace assessment (or use PLS) until Gerald Stabs from [place] can come to session, work on gait with quad tip cane (she is planning to get tip), hip strengthening, L NMR, balance reactions    PT [place]    Recommended Other Services I'm probably going to have to fix her goals, definitely her LTGs, but Raquel Sarna will adjust.    Consulted and Agree with Plan of Care Patient           Patient will benefit from skilled therapeutic intervention in order to improve the following deficits and impairments:  Abnormal gait, Decreased activity tolerance, Decreased balance, Decreased coordination, Decreased endurance, Decreased range of motion, Decreased strength, Difficulty walking, Increased muscle spasms, Pain, Decreased knowledge of use of DME, Decreased mobility  Visit Diagnosis: Other abnormalities of gait and mobility  Muscle weakness (generalized)     Problem List Patient  Active Problem List   Diagnosis Date Noted  . Amenorrhea 11/20/2018  . Well woman exam 11/20/2018  . MS (multiple sclerosis) ([place]) 03/14/2018  . CKD (chronic kidney disease), stage III   . Obesity (BMI 30.0-34.9)   . Anemia 01/09/2018  . Pulmonary embolism ([place]) 09/18/2017  . Elevated serum creatinine 05/12/2017  . Acute encephalopathy 04/20/2017  . Hydronephrosis of left kidney 10/24/2016  . Attention deficit 10/24/2016  . Left leg weakness 08/13/2016  . Chronic pain 08/13/2016  . Mild renal insufficiency 08/13/2016  . Left-sided weakness   . High risk medication use 09/15/2015  . Hip pain, bilateral 07/28/2015  . Urinary hesitancy 07/28/2015  . Multiple sclerosis exacerbation ([place]) 07/17/2015  . Urinary disorder 06/11/2015  . Gait disturbance 05/19/2015  . Numbness 05/19/2015  . Depression with anxiety 05/13/2015  . Other fatigue 05/13/2015  . Left knee pain 05/13/2015  . Avascular necrosis of bones of both hips ([place]) 05/13/2015  . Screening for HIV (human immunodeficiency virus) 07/08/2014  . Essential hypertension, benign 11/19/2013  . Anxiety state, unspecified 11/19/2013  . Screen for STD (sexually transmitted disease) 11/01/2013  . Encounter for routine gynecological examination 11/01/2013  . Oral contraceptive use 10/20/2011  . Multiple sclerosis ([place]) 08/08/2007  . RASH AND OTHER NONSPECIFIC SKIN ERUPTION 06/22/2007  . AVASCULAR NECROSIS, FEMORAL HEAD 03/11/2006  . Essential hypertension 02/03/2006  . 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 DVT, HX OF 02/03/2006    Cameron Sprang, PT, MPT [facility] [address] [place], [place], [address] Phone: [phone]   Fax:  [phone] 12/02/19, 4:22 PM  Name: Dawn Peters MRN: [id number] Date of Birth: 04-22-1980

## 2019-12-02 NOTE — Patient Instructions (Signed)
Access Code: [facility] URL: [email or web address] Date: 12/02/2019 Prepared by: Cameron Sprang  Exercises Seated Calf Stretch with Strap - 2 x daily - 7 x weekly - 1 sets - 3 reps - 30 hold Sit to Stand with Counter Support - 3 x daily - 7 x weekly - 1 sets - 5-10 reps Supine Lower Trunk Rotation - 2 x daily - 7 x weekly - 1 sets - 3-5 reps - 20-30 hold Seated Flexion Stretch - 2 x daily - 7 x weekly - 1 sets - 5-10 reps - 10 hold Hooklying Clamshell with Resistance - 1 x daily - 7 x weekly - 1 sets - 10 reps Supine ITB Stretch with Strap - 1 x daily - 7 x weekly - 1 sets - 2-3 reps - 30-60 sec hold

## 2019-12-03 NOTE — Telephone Encounter (Signed)
Yes please place the order and I can sign it thanks

## 2019-12-03 NOTE — Telephone Encounter (Signed)
Dr. Jaynee Eagles- are you okay with placing this order since Dr. Felecia Shelling is out?

## 2019-12-09 ENCOUNTER — Encounter: Payer: Self-pay | Admitting: Neurology

## 2019-12-10 ENCOUNTER — Ambulatory Visit: Payer: Medicare Other | Attending: Neurology

## 2019-12-10 DIAGNOSIS — R269 Unspecified abnormalities of gait and mobility: Secondary | ICD-10-CM | POA: Insufficient documentation

## 2019-12-10 DIAGNOSIS — R296 Repeated falls: Secondary | ICD-10-CM | POA: Insufficient documentation

## 2019-12-10 DIAGNOSIS — R2689 Other abnormalities of gait and mobility: Secondary | ICD-10-CM | POA: Insufficient documentation

## 2019-12-10 DIAGNOSIS — M6281 Muscle weakness (generalized): Secondary | ICD-10-CM | POA: Insufficient documentation

## 2019-12-13 ENCOUNTER — Ambulatory Visit: Payer: Medicare Other | Admitting: Physical Therapy

## 2019-12-13 ENCOUNTER — Other Ambulatory Visit: Payer: Self-pay

## 2019-12-13 ENCOUNTER — Encounter: Payer: Self-pay | Admitting: Physical Therapy

## 2019-12-13 DIAGNOSIS — R269 Unspecified abnormalities of gait and mobility: Secondary | ICD-10-CM

## 2019-12-13 DIAGNOSIS — M6281 Muscle weakness (generalized): Secondary | ICD-10-CM | POA: Diagnosis present

## 2019-12-13 DIAGNOSIS — R296 Repeated falls: Secondary | ICD-10-CM | POA: Diagnosis present

## 2019-12-13 DIAGNOSIS — R2689 Other abnormalities of gait and mobility: Secondary | ICD-10-CM | POA: Diagnosis not present

## 2019-12-15 NOTE — Therapy (Addendum)
[facility] [address] [place], [place], [address] Phone: [phone]   Fax:  [phone]  Physical Therapy Treatment and Re-cert  Patient Details  Name: Dawn Peters MRN: [id number] Date of Birth: [birth year] Referring Provider (PT): Britt Bottom MD   Encounter Date: 12/13/2019     12/13/19 1113  PT Visits / Re-Eval  Visit Number 3  Number of Visits 17  Date for PT Re-Evaluation 01/14/20  Authorization  Authorization Type [employer] MCR  Progress Note Due on Visit 10  PT Time Calculation  PT Start Time 1108 (pt late for appt today)  PT Stop Time 1145  PT Time Calculation (min) 37 min  PT - End of Session  Equipment Utilized During Treatment Gait belt  Activity Tolerance Patient tolerated treatment well  Behavior During Therapy [facility] for tasks assessed/performed    Past Medical History:  Diagnosis Date  . Anticoagulant long-term use    Eliquis  . Anxiety   . Chronic pain   . CKD (chronic kidney disease), stage III ([place])   . Depression   . 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 Gait disturbance    due to MS  . GERD (gastroesophageal reflux disease)    watch diet  . 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 History of DVT of lower extremity 2007  . History of encephalopathy 05/4740   acute metabolic encephalopathy secondary to MS,  resolved  . History of MRSA infection 2004   right hip infection post THA  . History of pulmonary embolus (PE) 2005  . Hydronephrosis, left    w/ acute kidney injury 02/ 2019  . Hypertension   . Left-sided weakness    due to MS  . Migraines   . MS (multiple sclerosis) [facility]) neurologist-  dr Renne Musca   dx 2000--  . Muscle spasticity   . Neurogenic bladder    due to MS  . Pulmonary embolism, bilateral ([place]) 09/17/2017   treated w/ eliquis  .  Strains to urinate   . Urgency of urination     Past Surgical History:  Procedure Laterality Date  . CYSTOSCOPY WITH RETROGRADE PYELOGRAM, URETEROSCOPY AND STENT PLACEMENT Bilateral 11/29/2017   Procedure: BILATERAL  RETROGRADE PYELOGRAM, LEFT DIAGNOSIC URETEROSCOPY AND STENT LEFT PLACEMENT;  Surgeon: Ardis Hughs, MD;  Location: [facility];  Service: Urology;  Laterality: Bilateral;  . HIP ARTHROSCOPY Left 07-05-2013   dr pill $Remov'@[facility]'RQVHiv$    iliopsosas release, synovectomy  . REVISION TOTAL HIP ARTHROPLASTY Right 03-28-2003    dr Alvan Dame $RemoveBe'@[facility]'YRBIVadPX$   . TOTAL HIP ARTHROPLASTY Bilateral left 11-05-2002  dr Alvan Dame $RemoveBe'@[facility]'jbXKtCztl$ ;   right 06/ 2004  $Rem'@[facility]'nleI$     There were no vitals filed for this visit.     12/13/19 1110  Symptoms/Limitations  Subjective Fell out of bed on Wed, rolled out of it. Has carpet burn on her right forearm. Was able to ger herself up. To clinic today with straight cane.  Pertinent History BIl THR,  ALLERGIC lisinopril, amoxicillin,  MS,  Limitations Standing;Walking;House hold activities  How long can you sit comfortably? unlimited  How long can you stand comfortably? < 10 min  How long can you walk comfortably? < 5 minutes  Patient Stated Goals I want to strengthen my LT side and be able to walk on my own.  I can t do  anything now.  I want to help my back pain  while I use a walker. It hurts when I lean over  Pain Assessment  Currently in Pain? Yes  Pain Score 7  Pain Location Leg  Pain Orientation Left  Pain Descriptors / Indicators Aching  Pain Type Chronic pain  Pain Onset More than a month ago  Pain Frequency Constant  Aggravating Factors  walking  Pain Relieving Factors resting, sitting down      12/13/19 1132  Transfers  Transfers Sit to Stand;Stand to Sit  Sit to Stand 5: Supervision  Stand to Sit 5: Supervision  Ambulation/Gait  Ambulation/Gait Yes  Ambulation/Gait Assistance 4: Min assist;3: Mod assist  Ambulation/Gait Assistance Details min to  mod assist for gait into/out of clinic with straight cane with several balance losses laterally and anterior. min assist with use of an AFO on left LE with 2 episodes of balance loss.   Ambulation Distance (Feet) 80 Feet (x2, 40 x1 with AFO)  Assistive device Straight cane  Gait Pattern Step-to pattern;Decreased hip/knee flexion - left;Decreased dorsiflexion - left;Left foot flat;Poor foot clearance - left  Ambulation Surface Level;Indoor     12/13/19 1114  Standardized Balance Assessment  Standardized Balance Assessment Berg Balance Test;Five Times Sit to Stand  Five times sit to stand comments  19.69 sec's with UE assist from standard height chair.   Berg Balance Test  Sit to Stand 4  Standing Unsupported 2 (uncontrolled sitting at 1 minute 12.32 sec's)  Sitting with Back Unsupported but Feet Supported on Floor or Stool 4  Stand to Sit 3  Transfers 2  Standing Unsupported with Eyes Closed 3  Standing Unsupported with Feet Together 2 (balance loss with sititng at 48 sec's)  From Standing, Reach Forward with Outstretched Arm 2 (3 inches)  From Standing Position, Pick up Object from Floor 3  From Standing Position, Turn to Look Behind Over each Shoulder 0  Turn 360 Degrees 0  Standing Unsupported, Alternately Place Feet on Step/Stool 0 (unable to lift LLE due to weakness without mod/max assist)  Standing Unsupported, One Foot in Front 2  Standing on One Leg 1  Total Score 28  Berg comment: <36 high risk for falls         PT Short Term Goals - 11/19/19 1636      PT SHORT TERM GOAL #1   Title Pt will be able to perform 5 x STS    Baseline Unable to perform 5 x STS at evaluation 11-19-19    Time 4    Period Weeks    Status New    Target Date 12/17/19      PT SHORT TERM GOAL #2   Title Patient will increase gross bilateral lower extremity strength to at least 4/5    Baseline see flowsheet    Time 4    Period Weeks    Status New    Target Date 12/17/19      PT SHORT  TERM GOAL #3   Title Patient will be independent with initial HEP     Baseline given intiial 11-19-19    Time 4    Period Weeks    Status New    Target Date 12/17/19             PT Long Term Goals - 11/19/19 1720      PT LONG TERM GOAL #1   Title LTG to be determined by Neuro Rehab PT    Time 8  Period Weeks    Status New    Target Date 01/14/20      PT LONG TERM GOAL #2   Title Pt will be evaluated for bracing to enhance ambulation ability due to AFO no longer fitting for foot drop on LT    Time 8    Period Weeks    Status New    Target Date 01/14/20      PT LONG TERM GOAL #3   Title Patient will be independent with final HEP.     Baseline Pt with MS exacerbation and needs current HEP to maximize function    Time 8    Period Weeks    Status New    Target Date 01/14/20             PT Short Term Goals - 01/20/20 1531      PT SHORT TERM GOAL #1   Title Pt will perform 5TSS in </=15 secs with single UE support only to indicate improved BLE strength.  (Target Date:02/17/20)    Baseline 17.47 with UE support    Time 4    Period Weeks    Status Revised    Target Date 02/17/20      PT SHORT TERM GOAL #2   Title Pt will improve BERG balance score to >/=33/56 in order to indicate dec fall risk.    Baseline 29/56    Time 4    Period Weeks    Status Revised      PT SHORT TERM GOAL #3   Title Pt will be independent with ongoing HEP in order to indicate improved strength and dec fall risk.    Time 4    Period Weeks    Status Revised    Target Date 12/17/19      PT SHORT TERM GOAL #4   Title Pt will improve gait speed to >/=2.00 ft/sec with 4 wheeled rollator (and bracing as needed) in order to indicate dec fall risk.    Time 4    Period Weeks    Status New           PT Long Term Goals - 01/20/20 1536      PT LONG TERM GOAL #1   Title Pt will improve BERG balance score to >/=37/56 in order to indicate dec fall risk. (Target date: 03/19/19)    Baseline  01/17/20: 29/56 scored today    Time 8    Period Weeks    Status Revised    Target Date 03/18/20      PT LONG TERM GOAL #2   Title Will continue to assess if foot up brace will be enough support when fatigued to prevent foot drop on LLE and dec fall risk.    Baseline 01/17/20: has had consult. Now working to see if foot up brace meets pt's needs. This is ongoing    Time 8    Period Weeks    Status Revised      PT LONG TERM GOAL #3   Title Patient will be independent with final HEP.     Baseline 01/17/20: reprorts compliance and no issues with current HEP    Time 8    Period Weeks    Status On-going      PT LONG TERM GOAL #4   Title Pt will ambulate 100' with LRAD at mod I level (with AFO) in simulate household environment to indicate safe home mobility.    Baseline 01/17/20: met distance portion at  supervision level with foot up brace and rollator    Time 8    Period Weeks    Status On-going      PT LONG TERM GOAL #5   Title Pt will improve 5TSS to </=12 secs with single UE support only to indicate improve BLE strength.    Baseline 01/17/20: 17.47 sec's with UE support from standard height surface    Time 8    Period Weeks    Status Revised      PT LONG TERM GOAL #6   Title Pt will ambulate x 300' over unlevel paved outdoor surfaces w/ LRAD at S level in order to indicate safety community mobility.    Baseline 01/17/20: unable to assess today due to time constraints    Time 8    Period Weeks    Status On-going      PT LONG TERM GOAL #7   Title Pt will improve gait speed to >/=2.3 ft/sec with LRAD in order to indicate dec fall risk.    Baseline 01/17/20: 1.54 ft/sec with rollator    Time 8    Period Weeks    Status Revised          Updated goals/recert done by: Cameron Sprang, PT, MPT [facility] [address] [place], [place], [address] Phone: [phone]   Fax:  [phone] 01/20/20, 3:40 PM    12/15/19 1814  Plan   Clinical Impression Statement Today's skilled session focused on setting baseline values for Berg Balance test and 5 time sit to stand with primary PT to set goal. Unable to get 10 meter gait speed due to pt with increased instabilty today with gait. Will plan to get 10 meter gait speed next session.  Pt will benefit from skilled therapeutic intervention in order to improve on the following deficits Abnormal gait;Decreased activity tolerance;Decreased balance;Decreased coordination;Decreased endurance;Decreased range of motion;Decreased strength;Difficulty walking;Increased muscle spasms;Pain;Decreased knowledge of use of DME;Decreased mobility  PT Frequency 2x / week  PT Duration 8 weeks  PT Treatment/Interventions ADLs/Self Care Home Management;Aquatic Therapy;Electrical Stimulation;Moist Heat;Balance training;Therapeutic exercise;Therapeutic activities;Functional mobility training;Stair training;Gait training;Neuromuscular re-education;Patient/family education;Manual techniques;Passive range of motion;Dry needling;Taping  PT Next Visit Plan gait speed-add goal, Continue brace assessment (or use PLS) until Gerald Stabs from [place] can come to session, work on gait with quad tip cane (she is planning to get tip), hip strengthening, L NMR, balance reactions  PT Home Exercise Plan [id number]  Consulted and Agree with Plan of Care Patient           Patient will benefit from skilled therapeutic intervention in order to improve the following deficits and impairments:  Abnormal gait, Decreased activity tolerance, Decreased balance, Decreased coordination, Decreased endurance, Decreased range of motion, Decreased strength, Difficulty walking, Increased muscle spasms, Pain, Decreased knowledge of use of DME, Decreased mobility  Visit Diagnosis: Gait disturbance  Other abnormalities of gait and mobility  Muscle weakness (generalized)     Problem List Patient Active Problem List   Diagnosis Date Noted   . Amenorrhea 11/20/2018  . Well woman exam 11/20/2018  . MS (multiple sclerosis) ([place]) 03/14/2018  . CKD (chronic kidney disease), stage III ([place])   . Obesity (BMI 30.0-34.9)   . Anemia 01/09/2018  . Pulmonary embolism ([place]) 09/18/2017  . Elevated serum creatinine 05/12/2017  . Acute encephalopathy 04/20/2017  . Hydronephrosis of left kidney 10/24/2016  . Attention deficit 10/24/2016  . Left leg weakness 08/13/2016  . Chronic pain 08/13/2016  . Mild renal insufficiency  08/13/2016  . Left-sided weakness   . High risk medication use 09/15/2015  . Hip pain, bilateral 07/28/2015  . Urinary hesitancy 07/28/2015  . Multiple sclerosis exacerbation ([place]) 07/17/2015  . Urinary disorder 06/11/2015  . Gait disturbance 05/19/2015  . Numbness 05/19/2015  . Depression with anxiety 05/13/2015  . Other fatigue 05/13/2015  . Left knee pain 05/13/2015  . Avascular necrosis of bones of both hips ([place]) 05/13/2015  . Screening for HIV (human immunodeficiency virus) 07/08/2014  . Essential hypertension, benign 11/19/2013  . Anxiety state, unspecified 11/19/2013  . Screen for STD (sexually transmitted disease) 11/01/2013  . Encounter for routine gynecological examination 11/01/2013  . Oral contraceptive use 10/20/2011  . Multiple sclerosis ([place]) 08/08/2007  . RASH AND OTHER NONSPECIFIC SKIN ERUPTION 06/22/2007  . AVASCULAR NECROSIS, FEMORAL HEAD 03/11/2006  . Essential hypertension 02/03/2006  . 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 DVT, HX OF 02/03/2006    Willow Ora, PTA, [facility] [address] [place],  [address] [phone] 12/15/19, 5:23 PM   Name: Dawn Peters MRN: [id number] Date of Birth: 1980-04-13

## 2019-12-16 ENCOUNTER — Telehealth: Payer: Self-pay | Admitting: Rehabilitation

## 2019-12-16 ENCOUNTER — Other Ambulatory Visit: Payer: Self-pay | Admitting: Neurology

## 2019-12-16 NOTE — Telephone Encounter (Signed)
Dr. Felecia Shelling,   Thank you so much for having the order put in on this patient for a LE AFO.  I have spoken with [facility] and because she just got an AFO last year, she will need a face to face with you in order to state that she has had a weight gain and brace is no longer appropriate due to not only weight gain but because of her decline in function.    Is there anyway that your office could set up an appt with her to get this?? I imagine it could even be a telehealth call, as long as your note reports why she needs a new brace.    Thanks so much! Cameron Sprang, PT, MPT [facility] [address] [place], [place], [address] Phone: [phone]   Fax:  [phone] 12/16/19, 3:01 PM

## 2019-12-17 ENCOUNTER — Ambulatory Visit: Payer: Medicare Other | Admitting: Physical Therapy

## 2019-12-18 ENCOUNTER — Ambulatory Visit: Payer: Medicare Other

## 2019-12-20 ENCOUNTER — Other Ambulatory Visit: Payer: Self-pay | Admitting: Neurology

## 2019-12-20 ENCOUNTER — Ambulatory Visit: Payer: Medicare Other

## 2019-12-20 ENCOUNTER — Telehealth: Payer: Self-pay

## 2019-12-20 NOTE — Telephone Encounter (Signed)
PT called patient as they were not present for their appointment on 10/15 at 1:15 pm. No answer, PT left voice message stating next scheduled therapy appointment.   Guillermina [place], PT, DPT

## 2019-12-24 ENCOUNTER — Encounter: Payer: Self-pay | Admitting: *Deleted

## 2019-12-25 ENCOUNTER — Ambulatory Visit: Payer: Medicare Other

## 2019-12-25 DIAGNOSIS — R2689 Other abnormalities of gait and mobility: Secondary | ICD-10-CM

## 2019-12-25 DIAGNOSIS — R296 Repeated falls: Secondary | ICD-10-CM

## 2019-12-25 DIAGNOSIS — M6281 Muscle weakness (generalized): Secondary | ICD-10-CM

## 2019-12-25 NOTE — Therapy (Signed)
[facility] [address] [place], [place], [address] Phone: [phone]   Fax:  [phone]  Physical Therapy Treatment  Patient Details  Name: Dawn Peters MRN: [id number] Date of Birth: 10-23-1980 Referring Provider (PT): Britt Bottom MD   Encounter Date: 12/25/2019   PT End of Session - 12/25/19 1329    Visit Number 4    Number of Visits 17    Date for PT Re-Evaluation 01/14/20    Authorization Type UHC MCR    Progress Note Due on Visit 10    PT Start Time 1448   pt arriving late for session   PT Stop Time 1359    PT Time Calculation (min) 33 min    Equipment Utilized During Treatment Gait belt    Activity Tolerance Patient tolerated treatment well    Behavior During Therapy WFL for tasks assessed/performed           Past Medical History:  Diagnosis Date  . Anticoagulant long-term use    Eliquis  . Anxiety   . Chronic pain   . CKD (chronic kidney disease), stage III ([place])   . Depression   . DVT, lower extremity, recurrent ([place]) 09/17/2017   left lower extremity-- treated with eliquis  . Gait disturbance    due to MS  . GERD (gastroesophageal reflux disease)    watch diet  . 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 History of DVT of lower extremity 2007  . History of encephalopathy 18/5631   acute metabolic encephalopathy secondary to MS,  resolved  . History of MRSA infection 2004   right hip infection post THA  . History of pulmonary embolus (PE) 2005  . Hydronephrosis, left    w/ acute kidney injury 02/ 2019  . Hypertension   . Left-sided weakness    due to MS  . Migraines   . MS (multiple sclerosis) [facility]) neurologist-  dr Renne Musca   dx 2000--  . Muscle spasticity   . Neurogenic bladder    due to MS  . Pulmonary embolism, bilateral ([place]) 09/17/2017   treated w/ eliquis  . Strains to urinate   . Urgency of  urination     Past Surgical History:  Procedure Laterality Date  . CYSTOSCOPY WITH RETROGRADE PYELOGRAM, URETEROSCOPY AND STENT PLACEMENT Bilateral 11/29/2017   Procedure: BILATERAL  RETROGRADE PYELOGRAM, LEFT DIAGNOSIC URETEROSCOPY AND STENT LEFT PLACEMENT;  Surgeon: Ardis Hughs, MD;  Location: [facility];  Service: Urology;  Laterality: Bilateral;  . HIP ARTHROSCOPY Left 07-05-2013   dr pill @[facility]    iliopsosas release, synovectomy  . REVISION TOTAL HIP ARTHROPLASTY Right 03-28-2003    dr Alvan Dame @[facility]   . TOTAL HIP ARTHROPLASTY Bilateral left 11-05-2002  dr Alvan Dame @[facility] ;   right 06/ 2004  @[facility]     There were no vitals filed for this visit.   Subjective Assessment - 12/25/19 1326    Subjective Patient reports that has had a couple falls since last visit, had one fall out of the bed and then others walking around the house where the L toe has caught. Patient reports that she needed help getting her self back up.    Pertinent History BIl THR,  ALLERGIC lisinopril, amoxicillin,  MS,    Limitations Standing;Walking;House hold activities    How long can you sit comfortably? unlimited    How long can you stand comfortably? <  10 min    How long can you walk comfortably? < 5 minutes    Patient Stated Goals I want to strengthen my LT side and be able to walk on my own.  I can t do anything now.  I want to help my back pain  while I use a walker. It hurts when I lean over    Currently in Pain? No/denies    Pain Onset More than a month ago                             [facility] Adult PT Treatment/Exercise - 12/25/19 0001      Transfers   Transfers Sit to Stand;Stand to Sit    Sit to Stand 4: Min guard    Stand to Sit 4: Min guard    Comments sit <> stand from w/c with rollator, CGA      Ambulation/Gait   Ambulation/Gait Yes    Ambulation/Gait Assistance 4: Min guard    Ambulation/Gait Assistance Details completed ambulation with rollator with CGA throughout  x 115 ft without AFO donned. PT requesting to complete ambulation with L PLS AFO donned, but patient requesting not to complete today due to fatigue and difficulty getting into shoes paitent is wearing to session today. PT educating on wearing proper tennis shoes at next session. PT also edudcating on use of rollator/three wheeled walker at home to promote improved safety and reducing fall risk at this time. Patient verbalizing understanding.     Ambulation Distance (Feet) 115 Feet   x 1, 80 x 1   Assistive device Rollator    Gait Pattern Step-to pattern;Decreased hip/knee flexion - left;Decreased dorsiflexion - left;Left foot flat;Poor foot clearance - left    Ambulation Surface Level;Indoor      Exercises   Exercises Knee/Hip      Knee/Hip Exercises: Seated   Long Arc Quad Strengthening;Both;1 set;10 reps;Limitations    Long Arc Quad Limitations in seated position, completed alternating LAQ x 10 reps bilaterally, increased difficulty noted with LLE.     Marching Strengthening;Both;1 set;Limitations;5 reps    Marching Limitations attempted completion in seated position, unable to complete on LLE without assistance in seated position due to muscle weakness.       Knee/Hip Exercises: Supine   Heel Slides Strengthening;AROM;Left;1 set;10 reps;Limitations    Heel Slides Limitations with pillowcase placed under LLE, completed heel slide. verbal cues and Min A required for completion. patient demo difficulty isolating hamstring activity and decreased control into knee extension with completion.     Bridges Strengthening;Both;2 sets;5 sets;Limitations    Bridges Limitations completed bridges x 10 reps, patient able to initiate bridge but unable to fully clear hips from mat. require verbal/tactile cues for proper completion.     Other Supine Knee/Hip Exercises completed isometric hip adduction with ball squeeze for 3 seconds, 2 x 10 reps. vebral cues for proper completion. Assistance required to keep  ball placed between BLE with completion. Completed single bent knee fall outs x 10 reps bilaterally. verbal cues to focus on control with completion.                   PT Education - 12/25/19 1452    Education Details educated on next scheduled appt time    Person(s) Educated Patient    Methods Explanation    Comprehension Verbalized understanding            PT Short Term Goals -  12/16/19 1447      PT SHORT TERM GOAL #1   Title Pt will be able to perform 5 x STS    Baseline 19.69 secs with BUE support    Time 4    Period Weeks    Status Achieved    Target Date 12/17/19      PT SHORT TERM GOAL #2   Title Patient will increase gross bilateral lower extremity strength to at least 4/5    Baseline see flowsheet    Time 4    Period Weeks    Status New    Target Date 12/17/19      PT SHORT TERM GOAL #3   Title Patient will be independent with initial HEP     Baseline given intiial 11-19-19    Time 4    Period Weeks    Status New    Target Date 12/17/19             PT Long Term Goals - 12/16/19 1447      PT LONG TERM GOAL #1   Title Pt will improve BERG balance score to >/=36/56 in order to indicate dec fall risk.    Time 8    Period Weeks    Status New      PT LONG TERM GOAL #2   Title Pt will be evaluated for bracing to enhance ambulation ability due to AFO no longer fitting for foot drop on LT    Time 8    Period Weeks    Status New      PT LONG TERM GOAL #3   Title Patient will be independent with final HEP.     Baseline Pt with MS exacerbation and needs current HEP to maximize function    Time 8    Period Weeks    Status New      PT LONG TERM GOAL #4   Title Pt will ambulate 100' with LRAD at mod I level (with AFO) in simulate household environment to indicate safe home mobility.    Time 8    Period Weeks    Status New      PT LONG TERM GOAL #5   Title Pt will improve 5TSS to </=15 secs with single UE support only to indicate improve BLE  strength.    Time 8    Period Weeks    Status New      Additional Long Term Goals   Additional Long Term Goals Yes      PT LONG TERM GOAL #6   Title Pt will ambulate x 300' over unlevel paved outdoor surfaces w/ LRAD at S level in order to indicate safety community mobility.    Time 8    Period Weeks                 Plan - 12/25/19 1454    Clinical Impression Statement Today's skilled PT session included gait training with rollator, PT educating on use of rollator or three wheeled walker at home to promote improved safety and reduced fall risk. Completed supine/seated strengthening exercises as tolerated by patient, increased muscle weakness noted on LLE. Will continue to progress toward all goals.    PT Frequency 2x / week    PT Duration 8 weeks    PT Treatment/Interventions ADLs/Self Care Home Management;Aquatic Therapy;Electrical Stimulation;Moist Heat;Balance training;Therapeutic exercise;Therapeutic activities;Functional mobility training;Stair training;Gait training;Neuromuscular re-education;Patient/family education;Manual techniques;Passive range of motion;Dry needling;Taping    PT Next Visit Plan gait speed-add goal (  measure with AFO donned), Continue brace assessment (or use PLS) until Gerald Stabs from [place] can come to session, work on gait with quad tip cane (she is planning to get tip), hip strengthening, L NMR, balance reactions    PT Home Exercise Plan [id number]    Consulted and Agree with Plan of Care Patient           Patient will benefit from skilled therapeutic intervention in order to improve the following deficits and impairments:  Abnormal gait, Decreased activity tolerance, Decreased balance, Decreased coordination, Decreased endurance, Decreased range of motion, Decreased strength, Difficulty walking, Increased muscle spasms, Pain, Decreased knowledge of use of DME, Decreased mobility  Visit Diagnosis: Other abnormalities of gait and mobility  Muscle  weakness (generalized)  Repeated falls     Problem List Patient Active Problem List   Diagnosis Date Noted  . Amenorrhea 11/20/2018  . Well woman exam 11/20/2018  . MS (multiple sclerosis) ([place]) 03/14/2018  . CKD (chronic kidney disease), stage III ([place])   . Obesity (BMI 30.0-34.9)   . Anemia 01/09/2018  . Pulmonary embolism ([place]) 09/18/2017  . Elevated serum creatinine 05/12/2017  . Acute encephalopathy 04/20/2017  . Hydronephrosis of left kidney 10/24/2016  . Attention deficit 10/24/2016  . Left leg weakness 08/13/2016  . Chronic pain 08/13/2016  . Mild renal insufficiency 08/13/2016  . Left-sided weakness   . High risk medication use 09/15/2015  . Hip pain, bilateral 07/28/2015  . Urinary hesitancy 07/28/2015  . Multiple sclerosis exacerbation ([place]) 07/17/2015  . Urinary disorder 06/11/2015  . Gait disturbance 05/19/2015  . Numbness 05/19/2015  . Depression with anxiety 05/13/2015  . Other fatigue 05/13/2015  . Left knee pain 05/13/2015  . Avascular necrosis of bones of both hips ([place]) 05/13/2015  . Screening for HIV (human immunodeficiency virus) 07/08/2014  . Essential hypertension, benign 11/19/2013  . Anxiety state, unspecified 11/19/2013  . Screen for STD (sexually transmitted disease) 11/01/2013  . Encounter for routine gynecological examination 11/01/2013  . Oral contraceptive use 10/20/2011  . Multiple sclerosis ([place]) 08/08/2007  . RASH AND OTHER NONSPECIFIC SKIN ERUPTION 06/22/2007  . AVASCULAR NECROSIS, FEMORAL HEAD 03/11/2006  . Essential hypertension 02/03/2006  . 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 DVT, HX OF 02/03/2006    Jones Bales, PT, DPT 12/25/2019, 2:57 PM  [facility] [address] [place], [place], [address] Phone: [phone]   Fax:  [phone]  Name: Dawn Peters MRN: [id number] Date of Birth: 02-23-1981

## 2019-12-27 ENCOUNTER — Ambulatory Visit: Payer: Medicare Other | Admitting: Physical Therapy

## 2019-12-30 ENCOUNTER — Ambulatory Visit: Payer: Medicare Other | Admitting: Neurology

## 2019-12-30 ENCOUNTER — Encounter: Payer: Self-pay | Admitting: Neurology

## 2019-12-31 ENCOUNTER — Ambulatory Visit: Payer: Medicare Other

## 2020-01-03 ENCOUNTER — Other Ambulatory Visit: Payer: Self-pay

## 2020-01-03 ENCOUNTER — Encounter: Payer: Self-pay | Admitting: Physical Therapy

## 2020-01-03 ENCOUNTER — Ambulatory Visit: Payer: Medicare Other | Admitting: Physical Therapy

## 2020-01-03 DIAGNOSIS — M6281 Muscle weakness (generalized): Secondary | ICD-10-CM

## 2020-01-03 DIAGNOSIS — R2689 Other abnormalities of gait and mobility: Secondary | ICD-10-CM | POA: Diagnosis not present

## 2020-01-03 DIAGNOSIS — R296 Repeated falls: Secondary | ICD-10-CM

## 2020-01-03 DIAGNOSIS — R269 Unspecified abnormalities of gait and mobility: Secondary | ICD-10-CM

## 2020-01-03 NOTE — Therapy (Signed)
[facility] [address] [place], [place], [address] Phone: [phone]   Fax:  [phone]  Physical Therapy Treatment  Patient Details  Name: Dawn Peters MRN: [id number] Date of Birth: [birth year] Referring Provider (PT): Britt Bottom MD   Encounter Date: 01/03/2020   PT End of Session - 01/03/20 1330    Visit Number 5    Number of Visits 17    Date for PT Re-Evaluation 01/14/20    Authorization Type UHC MCR    Progress Note Due on Visit 10    PT Start Time 4158   late for appt today   PT Stop Time 1400    PT Time Calculation (min) 33 min    Equipment Utilized During Treatment Gait belt    Activity Tolerance Patient tolerated treatment well;No increased pain    Behavior During Therapy WFL for tasks assessed/performed           Past Medical History:  Diagnosis Date   Anticoagulant long-term use    Eliquis   Anxiety    Chronic pain    CKD (chronic kidney disease), stage III ([place])    Depression    DVT, lower extremity, 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   left lower extremity-- treated with eliquis   Gait disturbance    due to MS   GERD (gastroesophageal reflux disease)    watch diet   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/p  THA   History of DVT of lower extremity 2007   History of encephalopathy 30/9407   acute metabolic encephalopathy secondary to MS,  resolved   History of MRSA infection 2004   right hip infection post THA   History of pulmonary embolus (PE) 2005   Hydronephrosis, left    w/ acute kidney injury 02/ 2019   Hypertension    Left-sided weakness    due to MS   Migraines    MS (multiple sclerosis) [facility]) neurologist-  dr Renne Musca   dx 2000--   Muscle spasticity    Neurogenic bladder    due to MS   Pulmonary embolism, bilateral ([place]) 09/17/2017   treated w/ eliquis   Strains to urinate     Urgency of urination     Past Surgical History:  Procedure Laterality Date   CYSTOSCOPY WITH RETROGRADE PYELOGRAM, URETEROSCOPY AND STENT PLACEMENT Bilateral 11/29/2017   Procedure: BILATERAL  RETROGRADE PYELOGRAM, LEFT DIAGNOSIC URETEROSCOPY AND STENT LEFT PLACEMENT;  Surgeon: Ardis Hughs, MD;  Location: [facility];  Service: Urology;  Laterality: Bilateral;   HIP ARTHROSCOPY Left 07-05-2013   dr pill _0    iliopsosas release, synovectomy   REVISION TOTAL HIP ARTHROPLASTY Right 03-28-2003    dr Alvan Dame _1    TOTAL HIP ARTHROPLASTY Bilateral left 11-05-2002  dr Alvan Dame _2 ;   right 06/ 2004  _3     There were no vitals filed for this visit.   Subjective Assessment - 01/03/20 1329    Subjective Pt arrived late to session. Fiance bringing her in with clinic wheelchair. No new falls and no pain.    Pertinent History BIl THR,  ALLERGIC lisinopril, amoxicillin,  MS,    Limitations Standing;Walking;House hold activities    How long can you sit comfortably? unlimited    How long can you stand comfortably? < 10 min    How long can you walk comfortably? < 5 minutes    Patient Stated Goals I want to  strengthen my LT side and be able to walk on my own.  I can t do anything now.  I want to help my back pain  while I use a walker. It hurts when I lean over    Currently in Pain? No/denies              [facility] Adult PT Treatment/Exercise - 01/03/20 1338      Transfers   Transfers Sit to Stand;Stand to Sit    Sit to Stand 4: Min guard    Stand to Sit 4: Min guard      Ambulation/Gait   Ambulation/Gait Yes    Ambulation/Gait Assistance 4: Min guard    Ambulation/Gait Assistance Details cues on posture, hand placement on brakes of rollator and for equal step length.     Ambulation Distance (Feet) 230 Feet   x1   Assistive device Rollator    Gait Pattern Step-to pattern;Decreased hip/knee flexion - left;Decreased dorsiflexion - left;Left foot flat;Poor foot  clearance - left    Ambulation Surface Level;Indoor      Self-Care   Self-Care Other Self-Care Comments    Other Self-Care Comments  discussed attendance and missed appointments. Pt understance with another missed appt without calling all appt's will be cancelled with her going to one at at time set up for scheduling.  Pt verbalized understanding.       Knee/Hip Exercises: Standing   Hip Flexion AROM;Stengthening;Both;1 set;10 reps;Knee bent;Limitations    Hip Flexion Limitations with UE support, alternating marching for 10 reps on each side.    Hip Abduction AROM;Stengthening;Both;1 set;10 reps;Knee straight;Limitations    Abduction Limitations with UE support, alternating sides for 10 on each leg    Functional Squat 1 set;10 reps;Limitations    Functional Squat Limitations with UE support, cues for correct form/technique.       Knee/Hip Exercises: Seated   Long Arc Quad Strengthening;Both;1 set;10 reps;Limitations    Long Arc Quad Limitations red band resistance with cues for slow, controlled movements    Hamstring Curl AROM;Strengthening;Both;1 set;10 reps;Limitations    Hamstring Limitations with red band resistance with cues for slow, controlled movements                PT Short Term Goals - 01/03/20 1331      PT SHORT TERM GOAL #1   Title Pt will be able to perform 5 x STS    Baseline 19.69 secs with BUE support    Status Achieved    Target Date 12/17/19      PT SHORT TERM GOAL #2   Title Patient will increase gross bilateral lower extremity strength to at least 4/5    Baseline 01/03/20:left hip/LE remains weak with 3/5 at best    Status Not Met    Target Date --      PT SHORT TERM GOAL #3   Title Patient will be independent with initial HEP     Baseline 01/03/20: pt reports compliance and no issues with HEP    Time --    Period --    Status Achieved    Target Date 12/17/19             PT Long Term Goals - 12/16/19 1447      PT LONG TERM GOAL #1    Title Pt will improve BERG balance score to >/=36/56 in order to indicate dec fall risk.    Time 8    Period Weeks    Status New  PT LONG TERM GOAL #2   Title Pt will be evaluated for bracing to enhance ambulation ability due to AFO no longer fitting for foot drop on LT    Time 8    Period Weeks    Status New      PT LONG TERM GOAL #3   Title Patient will be independent with final HEP.     Baseline Pt with MS exacerbation and needs current HEP to maximize function    Time 8    Period Weeks    Status New      PT LONG TERM GOAL #4   Title Pt will ambulate 100' with LRAD at mod I level (with AFO) in simulate household environment to indicate safe home mobility.    Time 8    Period Weeks    Status New      PT LONG TERM GOAL #5   Title Pt will improve 5TSS to </=15 secs with single UE support only to indicate improve BLE strength.    Time 8    Period Weeks    Status New      Additional Long Term Goals   Additional Long Term Goals Yes      PT LONG TERM GOAL #6   Title Pt will ambulate x 300' over unlevel paved outdoor surfaces w/ LRAD at S level in order to indicate safety community mobility.    Time 8    Period Weeks                 Plan - 01/03/20 1331    Clinical Impression Statement Today's skilled session continued to focus on LE strengthening and gait with rollator. Pt reports having a script for rollator and just needs to go get it. Discussed attendance and our attendance policy. Pt verbalized understanding. Refer to self care section for full detals. The pt is progressing and should benefit from continued PT to progress toward LTGs.    PT Frequency 2x / week    PT Duration 8 weeks    PT Treatment/Interventions ADLs/Self Care Home Management;Aquatic Therapy;Electrical Stimulation;Moist Heat;Balance training;Therapeutic exercise;Therapeutic activities;Functional mobility training;Stair training;Gait training;Neuromuscular re-education;Patient/family  education;Manual techniques;Passive range of motion;Dry needling;Taping    PT Next Visit Plan gait speed-add goal (measure with AFO donned), Continue brace assessment (or use PLS) until Gerald Stabs from [place] can come to session (need to reschedule as pt missed the one that was set up), work on gait with quad tip cane (she is planning to get tip), hip strengthening, L NMR, balance reactions    PT Home Exercise Plan [id number]    Consulted and Agree with Plan of Care Patient           Patient will benefit from skilled therapeutic intervention in order to improve the following deficits and impairments:  Abnormal gait, Decreased activity tolerance, Decreased balance, Decreased coordination, Decreased endurance, Decreased range of motion, Decreased strength, Difficulty walking, Increased muscle spasms, Pain, Decreased knowledge of use of DME, Decreased mobility  Visit Diagnosis: Other abnormalities of gait and mobility  Muscle weakness (generalized)  Repeated falls  Gait disturbance     Problem List Patient Active Problem List   Diagnosis Date Noted   Amenorrhea 11/20/2018   Well woman exam 11/20/2018   MS (multiple sclerosis) ([place]) 03/14/2018   CKD (chronic kidney disease), stage III ([place])    Obesity (BMI 30.0-34.9)    Anemia 01/09/2018   Pulmonary embolism ([place]) 09/18/2017   Elevated serum creatinine 05/12/2017   Acute encephalopathy 04/20/2017  Hydronephrosis of left kidney 10/24/2016   Attention deficit 10/24/2016   Left leg weakness 08/13/2016   Chronic pain 08/13/2016   Mild renal insufficiency 08/13/2016   Left-sided weakness    High risk medication use 09/15/2015   Hip pain, bilateral 07/28/2015   Urinary hesitancy 07/28/2015   Multiple sclerosis exacerbation ([place]) 07/17/2015   Urinary disorder 06/11/2015   Gait disturbance 05/19/2015   Numbness 05/19/2015   Depression with anxiety 05/13/2015   Other fatigue 05/13/2015   Left knee pain  05/13/2015   Avascular necrosis of bones of both hips ([place]) 05/13/2015   Screening for HIV (human immunodeficiency virus) 07/08/2014   Essential hypertension, benign 11/19/2013   Anxiety state, unspecified 11/19/2013   Screen for STD (sexually transmitted disease) 11/01/2013   Encounter for routine gynecological examination 11/01/2013   Oral contraceptive use 10/20/2011   Multiple sclerosis ([place]) 08/08/2007   RASH AND OTHER NONSPECIFIC SKIN ERUPTION 06/22/2007   AVASCULAR NECROSIS, FEMORAL HEAD 03/11/2006   Essential hypertension 02/03/2006   MICROALBUMINURIA 02/03/2006   DVT, HX OF 02/03/2006    Willow Ora, PTA, [facility] [address] [place], [place] [address] [phone] 01/03/20, 2:37 PM   Name: Dawn Peters MRN: [id number] Date of Birth: 22-Nov-1980

## 2020-01-06 ENCOUNTER — Encounter: Payer: Self-pay | Admitting: Rehabilitation

## 2020-01-06 ENCOUNTER — Telehealth: Payer: Self-pay | Admitting: Rehabilitation

## 2020-01-06 ENCOUNTER — Ambulatory Visit: Payer: Medicare Other | Attending: Neurology | Admitting: Rehabilitation

## 2020-01-06 ENCOUNTER — Other Ambulatory Visit: Payer: Self-pay

## 2020-01-06 DIAGNOSIS — R2689 Other abnormalities of gait and mobility: Secondary | ICD-10-CM | POA: Insufficient documentation

## 2020-01-06 DIAGNOSIS — M6281 Muscle weakness (generalized): Secondary | ICD-10-CM | POA: Diagnosis present

## 2020-01-06 DIAGNOSIS — R296 Repeated falls: Secondary | ICD-10-CM | POA: Insufficient documentation

## 2020-01-06 DIAGNOSIS — R269 Unspecified abnormalities of gait and mobility: Secondary | ICD-10-CM | POA: Insufficient documentation

## 2020-01-06 NOTE — Therapy (Signed)
[facility] [address] [place], [place], [address] Phone: [phone]   Fax:  [phone]  Physical Therapy Treatment  Patient Details  Name: Dawn Peters MRN: [id number] Date of Birth: 04/10/1980 Referring Provider (PT): Britt Bottom MD   Encounter Date: 01/06/2020   PT End of Session - 01/06/20 1308    Visit Number 6    Number of Visits 17    Date for PT Re-Evaluation 01/14/20    Authorization Type UHC MCR    Progress Note Due on Visit 10    PT Start Time 3235    PT Stop Time 1230    PT Time Calculation (min) 43 min    Equipment Utilized During Treatment Gait belt    Activity Tolerance Patient tolerated treatment well;No increased pain    Behavior During Therapy WFL for tasks assessed/performed           Past Medical History:  Diagnosis Date  . Anticoagulant long-term use    Eliquis  . Anxiety   . Chronic pain   . CKD (chronic kidney disease), stage III ([place])   . Depression   . DVT, lower extremity, recurrent ([place]) 09/17/2017   left lower extremity-- treated with eliquis  . Gait disturbance    due to MS  . GERD (gastroesophageal reflux disease)    watch diet  . 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 History of DVT of lower extremity 2007  . History of encephalopathy 57/3220   acute metabolic encephalopathy secondary to MS,  resolved  . History of MRSA infection 2004   right hip infection post THA  . History of pulmonary embolus (PE) 2005  . Hydronephrosis, left    w/ acute kidney injury 02/ 2019  . Hypertension   . Left-sided weakness    due to MS  . Migraines   . MS (multiple sclerosis) [facility]) neurologist-  dr Renne Musca   dx 2000--  . Muscle spasticity   . Neurogenic bladder    due to MS  . Pulmonary embolism, bilateral ([place]) 09/17/2017   treated w/ eliquis  . Strains to urinate   . Urgency of urination      Past Surgical History:  Procedure Laterality Date  . CYSTOSCOPY WITH RETROGRADE PYELOGRAM, URETEROSCOPY AND STENT PLACEMENT Bilateral 11/29/2017   Procedure: BILATERAL  RETROGRADE PYELOGRAM, LEFT DIAGNOSIC URETEROSCOPY AND STENT LEFT PLACEMENT;  Surgeon: Ardis Hughs, MD;  Location: [facility];  Service: Urology;  Laterality: Bilateral;  . HIP ARTHROSCOPY Left 07-05-2013   dr pill _0    iliopsosas release, synovectomy  . REVISION TOTAL HIP ARTHROPLASTY Right 03-28-2003    dr Alvan Dame _1   . TOTAL HIP ARTHROPLASTY Bilateral left 11-05-2002  dr Alvan Dame _2 ;   right 06/ 2004  _3     There were no vitals filed for this visit.   Subjective Assessment - 01/06/20 1156    Subjective Pt reports 8/10 back pain today.  Has back support that husband purchased.    Pertinent History BIl THR,  ALLERGIC lisinopril, amoxicillin,  MS,    Patient Stated Goals I want to strengthen my LT side and be able to walk on my own.  I can t do anything now.  I want to help my back pain  while I use a walker. It hurts when I lean over    Currently in Pain? Yes    Pain Score  8     Pain Location Back    Pain Orientation Right;Left;Lower    Pain Descriptors / Indicators Aching;Throbbing    Pain Type Chronic pain    Pain Onset More than a month ago    Pain Frequency Constant    Aggravating Factors  walking    Pain Relieving Factors resting, sitting                             [facility] Adult PT Treatment/Exercise - 01/06/20 1311      Transfers   Transfers Sit to Stand;Stand to Sit    Sit to Stand 5: Supervision;4: Min guard    Sit to Stand Details Verbal cues for sequencing;Verbal cues for technique;Verbal cues for precautions/safety    Sit to Stand Details (indicate cue type and reason) Cues for hand placement and safety with rollator     Stand to Sit 4: Min guard    Stand to Sit Details (indicate cue type and reason) Verbal cues for sequencing;Verbal cues for  technique;Verbal cues for precautions/safety      Ambulation/Gait   Ambulation/Gait Yes    Ambulation/Gait Assistance 4: Min assist;4: Min guard    Ambulation/Gait Assistance Details Pt more min A to min/guard with use of her tri-wheeled rollator and especially without brace for LLE as she tends to drag at times.  She reports she has been using regular RW during sessions, however she did mean rollator.  PT did trial both during session.  She does need max verbal and demo cues for correct turning and safe sequencing.  Once we switched to rollator, she did slightly better but does need cues for maintaining closeness to the rollator to maintain upright posture.  Note that husband did help her don light back support prior to session.  She reports she feels this does help her posture.  She reports she is getting rollator today when they leave therapy.  During first bout of gait, donned Ott[place]ock PLS AFO (did use clinics shoes to provide more room) x 115'.  Note that L foot did continue to catch multiple times, therefore also tried Ottobock reaction AFO for more support and also to allow brace's "c" shape to help with forward advancement.  She prefers PLS but also somewhat catches foot with this brace as well.  Will schedule chris from [facility] to come to future session.     Ambulation Distance (Feet) 115 Feet   x 3 reps    Assistive device Rolling walker;4-wheeled walker   L AFO    Gait Pattern Step-to pattern;Decreased hip/knee flexion - left;Decreased dorsiflexion - left;Left foot flat;Poor foot clearance - left    Ambulation Surface Level;Indoor    Gait velocity 1.19 ft/sec with rollator and AFO       Self-Care   Self-Care Other Self-Care Comments    Other Self-Care Comments  Continue to discuss importance of attendance to sessions and that PT would like to request more visits for her and have Gerald Stabs from [facility] come to future session.  Pt verbalized understanding and would like to continue coming to PT.    She is also interested in seeing Donnetta Simpers from [facility] for low back pain.  Will request order for this and schedule as able.        Exercises   Other Exercises  Supine BLE bridging (PT assisting with foot placement) x 10 reps, L heel slides (without shoe) x 10 reps active assisted, and  pillow squeeze (PT ensuring L foot placement) x 10 reps.  She reports she is doing these at home and husband is assisting.                     PT Short Term Goals - 01/03/20 1331      PT SHORT TERM GOAL #1   Title Pt will be able to perform 5 x STS    Baseline 19.69 secs with BUE support    Status Achieved    Target Date 12/17/19      PT SHORT TERM GOAL #2   Title Patient will increase gross bilateral lower extremity strength to at least 4/5    Baseline 01/03/20:left hip/LE remains weak with 3/5 at best    Status Not Met    Target Date --      PT SHORT TERM GOAL #3   Title Patient will be independent with initial HEP     Baseline 01/03/20: pt reports compliance and no issues with HEP    Time --    Period --    Status Achieved    Target Date 12/17/19             PT Long Term Goals - 01/06/20 1522      PT LONG TERM GOAL #1   Title Pt will improve BERG balance score to >/=36/56 in order to indicate dec fall risk.    Time 8    Period Weeks    Status New      PT LONG TERM GOAL #2   Title Pt will be evaluated for bracing to enhance ambulation ability due to AFO no longer fitting for foot drop on LT    Time 8    Period Weeks    Status New      PT LONG TERM GOAL #3   Title Patient will be independent with final HEP.     Baseline Pt with MS exacerbation and needs current HEP to maximize function    Time 8    Period Weeks    Status New      PT LONG TERM GOAL #4   Title Pt will ambulate 100' with LRAD at mod I level (with AFO) in simulate household environment to indicate safe home mobility.    Time 8    Period Weeks    Status New      PT LONG TERM GOAL #5   Title Pt will  improve 5TSS to </=15 secs with single UE support only to indicate improve BLE strength.    Time 8    Period Weeks    Status New      PT LONG TERM GOAL #6   Title Pt will ambulate x 300' over unlevel paved outdoor surfaces w/ LRAD at S level in order to indicate safety community mobility.    Time 8    Period Weeks      PT LONG TERM GOAL #7   Title Pt will improve gait speed to >/=1.79 ft/sec with LRAD in order to indicate dec fall risk.    Time 8    Period Weeks    Status New                 Plan - 01/06/20 1309    Clinical Impression Statement Continued to focus on gait with rollator and L PLS vs reaction AFO.  Pt has slight clearance issues with both despite borrowing clinics shoes that has toe cap.  PT  to call [facility] and schedule another time for Gerald Stabs to come over and assess pt.  Also continue to work on BLE strengthening in supine.  Pt reports she is getting rollator today from supply store.    PT Frequency 2x / week    PT Duration 8 weeks    PT Treatment/Interventions ADLs/Self Care Home Management;Aquatic Therapy;Electrical Stimulation;Moist Heat;Balance training;Therapeutic exercise;Therapeutic activities;Functional mobility training;Stair training;Gait training;Neuromuscular re-education;Patient/family education;Manual techniques;Passive range of motion;Dry needling;Taping    PT Next Visit Plan Juliann Pulse can you check goals and I will recert on Monday Gerald Stabs is coming Monday, so I didn't want to save goals for then).  Also can you have her schedule more visits (2x/wk for 8 weeks-I may decrease if she does low back pain at [place]) Continue brace assessment (or use PLS) until Gerald Stabs from [facility] can come to session (need to reschedule as pt missed the one that was set up), work on gait with quad tip cane (she is planning to get tip), hip strengthening, L NMR, balance reactions    PT [place]    Recommended Other Services See if order for Low back pain is  in-schedule first visit with Donnetta Simpers.    Consulted and Agree with Plan of Care Patient           Patient will benefit from skilled therapeutic intervention in order to improve the following deficits and impairments:  Abnormal gait, Decreased activity tolerance, Decreased balance, Decreased coordination, Decreased endurance, Decreased range of motion, Decreased strength, Difficulty walking, Increased muscle spasms, Pain, Decreased knowledge of use of DME, Decreased mobility  Visit Diagnosis: Other abnormalities of gait and mobility  Muscle weakness (generalized)  Repeated falls  Gait disturbance     Problem List Patient Active Problem List   Diagnosis Date Noted  . Amenorrhea 11/20/2018  . Well woman exam 11/20/2018  . MS (multiple sclerosis) ([place]) 03/14/2018  . CKD (chronic kidney disease), stage III ([place])   . Obesity (BMI 30.0-34.9)   . Anemia 01/09/2018  . Pulmonary embolism ([place]) 09/18/2017  . Elevated serum creatinine 05/12/2017  . Acute encephalopathy 04/20/2017  . Hydronephrosis of left kidney 10/24/2016  . Attention deficit 10/24/2016  . Left leg weakness 08/13/2016  . Chronic pain 08/13/2016  . Mild renal insufficiency 08/13/2016  . Left-sided weakness   . High risk medication use 09/15/2015  . Hip pain, bilateral 07/28/2015  . Urinary hesitancy 07/28/2015  . Multiple sclerosis exacerbation ([place]) 07/17/2015  . Urinary disorder 06/11/2015  . Gait disturbance 05/19/2015  . Numbness 05/19/2015  . Depression with anxiety 05/13/2015  . Other fatigue 05/13/2015  . Left knee pain 05/13/2015  . Avascular necrosis of bones of both hips ([place]) 05/13/2015  . Screening for HIV (human immunodeficiency virus) 07/08/2014  . Essential hypertension, benign 11/19/2013  . Anxiety state, unspecified 11/19/2013  . Screen for STD (sexually transmitted disease) 11/01/2013  . Encounter for routine gynecological examination 11/01/2013  . Oral contraceptive use 10/20/2011  .  Multiple sclerosis ([place]) 08/08/2007  . RASH AND OTHER NONSPECIFIC SKIN ERUPTION 06/22/2007  . AVASCULAR NECROSIS, FEMORAL HEAD 03/11/2006  . Essential hypertension 02/03/2006  . 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 DVT, HX OF 02/03/2006    Cameron Sprang, PT, MPT [facility] [address] [place], [place], [address] Phone: [phone]   Fax:  [phone] 01/06/20, 3:24 PM  Name: Damyah Gugel MRN: [id number] Date of Birth: 1980-09-17

## 2020-01-06 NOTE — Telephone Encounter (Signed)
Dr. Felecia Shelling,   Thank you so much for having the order put in on this patient for a LE AFO.  I have spoken with [facility] and because she just got an AFO last year, she will need a face to face with you in order to state that she has had a weight gain and brace is no longer appropriate due to not only weight gain but because of her decline in function.    Is there anyway that your office could set up an appt with her to get this?? I imagine it could even be a telehealth call, as long as your note reports why she needs a new brace.    I am also wanting to request a separate PT order for low back pain so that we may address this as well.    Thanks so much! Cameron Sprang, PT, MPT [facility] [address] [place], [place], [address] Phone: [phone]   Fax:  [phone]

## 2020-01-09 ENCOUNTER — Ambulatory Visit: Payer: Medicare Other | Admitting: Physical Therapy

## 2020-01-13 ENCOUNTER — Other Ambulatory Visit: Payer: Self-pay

## 2020-01-13 ENCOUNTER — Ambulatory Visit: Payer: Medicare Other

## 2020-01-13 DIAGNOSIS — R269 Unspecified abnormalities of gait and mobility: Secondary | ICD-10-CM

## 2020-01-13 DIAGNOSIS — R2689 Other abnormalities of gait and mobility: Secondary | ICD-10-CM

## 2020-01-13 DIAGNOSIS — R296 Repeated falls: Secondary | ICD-10-CM

## 2020-01-13 DIAGNOSIS — M6281 Muscle weakness (generalized): Secondary | ICD-10-CM

## 2020-01-13 NOTE — Therapy (Signed)
[facility] [address] [place], [place], [address] Phone: [phone]   Fax:  [phone]  Physical Therapy Treatment  Patient Details  Name: Dawn Peters MRN: [id number] Date of Birth: January 15, 1981 Referring Provider (PT): Britt Bottom MD   Encounter Date: 01/13/2020   PT End of Session - 01/13/20 1504    Visit Number 8    Number of Visits 17    Date for PT Re-Evaluation 01/14/20    Authorization Type UHC MCR    Progress Note Due on Visit 10    PT Start Time 4944    PT Stop Time 1230    PT Time Calculation (min) 45 min    Equipment Utilized During Treatment Gait belt    Activity Tolerance Patient tolerated treatment well;No increased pain    Behavior During Therapy WFL for tasks assessed/performed           Past Medical History:  Diagnosis Date  . Anticoagulant long-term use    Eliquis  . Anxiety   . Chronic pain   . CKD (chronic kidney disease), stage III ([place])   . Depression   . DVT, lower extremity, recurrent ([place]) 09/17/2017   left lower extremity-- treated with eliquis  . Gait disturbance    due to MS  . GERD (gastroesophageal reflux disease)    watch diet  . 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 History of DVT of lower extremity 2007  . History of encephalopathy 96/7591   acute metabolic encephalopathy secondary to MS,  resolved  . History of MRSA infection 2004   right hip infection post THA  . History of pulmonary embolus (PE) 2005  . Hydronephrosis, left    w/ acute kidney injury 02/ 2019  . Hypertension   . Left-sided weakness    due to MS  . Migraines   . MS (multiple sclerosis) [facility]) neurologist-  dr Renne Musca   dx 2000--  . Muscle spasticity   . Neurogenic bladder    due to MS  . Pulmonary embolism, bilateral ([place]) 09/17/2017   treated w/ eliquis  . Strains to urinate   . Urgency of urination      Past Surgical History:  Procedure Laterality Date  . CYSTOSCOPY WITH RETROGRADE PYELOGRAM, URETEROSCOPY AND STENT PLACEMENT Bilateral 11/29/2017   Procedure: BILATERAL  RETROGRADE PYELOGRAM, LEFT DIAGNOSIC URETEROSCOPY AND STENT LEFT PLACEMENT;  Surgeon: Ardis Hughs, MD;  Location: [facility];  Service: Urology;  Laterality: Bilateral;  . HIP ARTHROSCOPY Left 07-05-2013   dr pill $Remov'@[facility]'ndVRKS$    iliopsosas release, synovectomy  . REVISION TOTAL HIP ARTHROPLASTY Right 03-28-2003    dr Alvan Dame $RemoveBe'@[facility]'xlBqmlAGl$   . TOTAL HIP ARTHROPLASTY Bilateral left 11-05-2002  dr Alvan Dame $RemoveBe'@[facility]'fhmCiqaVr$ ;   right 06/ 2004  $Rem'@[facility]'yEit$     There were no vitals filed for this visit.   Subjective Assessment - 01/13/20 1501    Subjective Pt reports her R knee buckles intermittently and she has trouble not dragging her foot.    Pertinent History BIl THR,  ALLERGIC lisinopril, amoxicillin,  MS,    Patient Stated Goals I want to strengthen my LT side and be able to walk on my own.  I can t do anything now.  I want to help my back pain  while I use a walker. It hurts when I lean over    Currently in Pain? No/denies  Pain Onset More than a month ago                 Gerald Stabs from [facility] present for AFO evaluation Pt recommended to get walking shoes with tongue that separates, get wider toe box and 1/2 to full size bigger to accommodate AFO. Pt given several recommendations from new [employer]. Pt was given choice for [place] but she preferred to go with [employer] [facility]. Patient was educated on how to look for mid foot stability in the shoe by (bend and twist test ) in shoes.  Gait training: 1 x 115' without AFO and Rollator; 1 x 207' with PLS and rollator, 1 x 207' with Ground reaction AFO and rollator: SBA with all the walking, cues to stand up tall  Discussed possibility of putting shoe slider on bottom of shoes to assist with sliding. Pt educated that her ability to clear foot will depend on hip flexion during swing phase also  and AFO will not improve foot clearance 100% of the time  1 x 414' without AFO with rollator + up and down ramp with rollator + 16 steps with bil rail (all without break)- to try to induce fatigue and see response from patient's L LE, pt only demonstrated fatigue and difficulty with lifting L LE on stairs and decreased foot clearance.                       PT Short Term Goals - 01/03/20 1331      PT SHORT TERM GOAL #1   Title Pt will be able to perform 5 x STS    Baseline 19.69 secs with BUE support    Status Achieved    Target Date 12/17/19      PT SHORT TERM GOAL #2   Title Patient will increase gross bilateral lower extremity strength to at least 4/5    Baseline 01/03/20:left hip/LE remains weak with 3/5 at best    Status Not Met    Target Date --      PT SHORT TERM GOAL #3   Title Patient will be independent with initial HEP     Baseline 01/03/20: pt reports compliance and no issues with HEP    Time --    Period --    Status Achieved    Target Date 12/17/19             PT Long Term Goals - 01/06/20 1522      PT LONG TERM GOAL #1   Title Pt will improve BERG balance score to >/=36/56 in order to indicate dec fall risk.    Time 8    Period Weeks    Status New      PT LONG TERM GOAL #2   Title Pt will be evaluated for bracing to enhance ambulation ability due to AFO no longer fitting for foot drop on LT    Time 8    Period Weeks    Status New      PT LONG TERM GOAL #3   Title Patient will be independent with final HEP.     Baseline Pt with MS exacerbation and needs current HEP to maximize function    Time 8    Period Weeks    Status New      PT LONG TERM GOAL #4   Title Pt will ambulate 100' with LRAD at mod I level (with AFO) in simulate household environment to indicate safe home mobility.  Time 8    Period Weeks    Status New      PT LONG TERM GOAL #5   Title Pt will improve 5TSS to </=15 secs with single UE support only to indicate  improve BLE strength.    Time 8    Period Weeks    Status New      PT LONG TERM GOAL #6   Title Pt will ambulate x 300' over unlevel paved outdoor surfaces w/ LRAD at S level in order to indicate safety community mobility.    Time 8    Period Weeks      PT LONG TERM GOAL #7   Title Pt will improve gait speed to >/=1.79 ft/sec with LRAD in order to indicate dec fall risk.    Time 8    Period Weeks    Status New                 Plan - 01/13/20 1502    Clinical Impression Statement Today's skilled session was focused on evaluating apprprorpirate AFO to address foot drop and knee buckling with help from [place] from [place]. During the treatment, patient felt most comfortable and steady with Ott[place]ock, ground reaction AFO compared to PLS as it gave her for sense of security in her R knee with walking and stairs.    PT Frequency 2x / week    PT Duration 8 weeks    PT Treatment/Interventions ADLs/Self Care Home Management;Aquatic Therapy;Electrical Stimulation;Moist Heat;Balance training;Therapeutic exercise;Therapeutic activities;Functional mobility training;Stair training;Gait training;Neuromuscular re-education;Patient/family education;Manual techniques;Passive range of motion;Dry needling;Taping    PT Next Visit Plan Recert next session, trial of Ground reaction AFO with PT, Pt to buy new shoes ([facility]) to accomodate AFO, pt will schedule appt with Gerald Stabs once she gets her new shoes.    PT Home Exercise Plan [id number]    Consulted and Agree with Plan of Care Patient           Patient will benefit from skilled therapeutic intervention in order to improve the following deficits and impairments:  Abnormal gait, Decreased activity tolerance, Decreased balance, Decreased coordination, Decreased endurance, Decreased range of motion, Decreased strength, Difficulty walking, Increased muscle spasms, Pain, Decreased knowledge of use of DME, Decreased mobility  Visit Diagnosis: Other  abnormalities of gait and mobility  Muscle weakness (generalized)  Repeated falls  Gait disturbance     Problem List Patient Active Problem List   Diagnosis Date Noted  . Amenorrhea 11/20/2018  . Well woman exam 11/20/2018  . MS (multiple sclerosis) ([place]) 03/14/2018  . CKD (chronic kidney disease), stage III ([place])   . Obesity (BMI 30.0-34.9)   . Anemia 01/09/2018  . Pulmonary embolism ([place]) 09/18/2017  . Elevated serum creatinine 05/12/2017  . Acute encephalopathy 04/20/2017  . Hydronephrosis of left kidney 10/24/2016  . Attention deficit 10/24/2016  . Left leg weakness 08/13/2016  . Chronic pain 08/13/2016  . Mild renal insufficiency 08/13/2016  . Left-sided weakness   . High risk medication use 09/15/2015  . Hip pain, bilateral 07/28/2015  . Urinary hesitancy 07/28/2015  . Multiple sclerosis exacerbation ([place]) 07/17/2015  . Urinary disorder 06/11/2015  . Gait disturbance 05/19/2015  . Numbness 05/19/2015  . Depression with anxiety 05/13/2015  . Other fatigue 05/13/2015  . Left knee pain 05/13/2015  . Avascular necrosis of bones of both hips ([place]) 05/13/2015  . Screening for HIV (human immunodeficiency virus) 07/08/2014  . Essential hypertension, benign 11/19/2013  . Anxiety state, unspecified 11/19/2013  . Screen  for STD (sexually transmitted disease) 11/01/2013  . Encounter for routine gynecological examination 11/01/2013  . Oral contraceptive use 10/20/2011  . Multiple sclerosis ([place]) 08/08/2007  . RASH AND OTHER NONSPECIFIC SKIN ERUPTION 06/22/2007  . AVASCULAR NECROSIS, FEMORAL HEAD 03/11/2006  . Essential hypertension 02/03/2006  . 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 DVT, HX OF 02/03/2006    Kerrie Pleasure, PT 01/13/2020, 3:13 PM  [facility] [address] [place], [place], [address] Phone: [phone]   Fax:  [phone]  Name: Raisha Brabender MRN: [id number] Date of Birth:  1980-04-27

## 2020-01-15 ENCOUNTER — Ambulatory Visit: Payer: Medicare Other | Admitting: Rehabilitation

## 2020-01-17 ENCOUNTER — Ambulatory Visit: Payer: Medicare Other | Admitting: Physical Therapy

## 2020-01-17 ENCOUNTER — Other Ambulatory Visit: Payer: Self-pay

## 2020-01-17 ENCOUNTER — Encounter: Payer: Self-pay | Admitting: Physical Therapy

## 2020-01-17 DIAGNOSIS — R2689 Other abnormalities of gait and mobility: Secondary | ICD-10-CM | POA: Diagnosis not present

## 2020-01-17 DIAGNOSIS — R296 Repeated falls: Secondary | ICD-10-CM

## 2020-01-17 DIAGNOSIS — M6281 Muscle weakness (generalized): Secondary | ICD-10-CM

## 2020-01-18 NOTE — Therapy (Signed)
[facility] [address] [place], [place], [address] Phone: [phone]   Fax:  [phone]  Physical Therapy Treatment  Patient Details  Name: Dawn Peters MRN: [id number] Date of Birth: [birth year] Referring Provider (PT): Britt Bottom MD   Encounter Date: 01/17/2020   PT End of Session - 01/17/20 1021    Visit Number 8   number corrected on 11/12 due to double columns at last session with number double counted.   Number of Visits 17    Date for PT Re-Evaluation 01/14/20    Authorization Type UHC MCR    Progress Note Due on Visit 10    PT Start Time 1017    PT Stop Time 1100    PT Time Calculation (min) 43 min    Equipment Utilized During Treatment Gait belt    Activity Tolerance Patient tolerated treatment well;No increased pain    Behavior During Therapy WFL for tasks assessed/performed           Past Medical History:  Diagnosis Date  . Anticoagulant long-term use    Eliquis  . Anxiety   . Chronic pain   . CKD (chronic kidney disease), stage III ([place])   . Depression   . DVT, lower extremity, recurrent ([place]) 09/17/2017   left lower extremity-- treated with eliquis  . Gait disturbance    due to MS  . GERD (gastroesophageal reflux disease)    watch diet  . 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 History of DVT of lower extremity 2007  . History of encephalopathy 20/2542   acute metabolic encephalopathy secondary to MS,  resolved  . History of MRSA infection 2004   right hip infection post THA  . History of pulmonary embolus (PE) 2005  . Hydronephrosis, left    w/ acute kidney injury 02/ 2019  . Hypertension   . Left-sided weakness    due to MS  . Migraines   . MS (multiple sclerosis) [facility]) neurologist-  dr Renne Musca   dx 2000--  . Muscle spasticity   . Neurogenic bladder    due to MS  . Pulmonary embolism, bilateral  ([place]) 09/17/2017   treated w/ eliquis  . Strains to urinate   . Urgency of urination     Past Surgical History:  Procedure Laterality Date  . CYSTOSCOPY WITH RETROGRADE PYELOGRAM, URETEROSCOPY AND STENT PLACEMENT Bilateral 11/29/2017   Procedure: BILATERAL  RETROGRADE PYELOGRAM, LEFT DIAGNOSIC URETEROSCOPY AND STENT LEFT PLACEMENT;  Surgeon: Ardis Hughs, MD;  Location: [facility];  Service: Urology;  Laterality: Bilateral;  . HIP ARTHROSCOPY Left 07-05-2013   dr pill _0    iliopsosas release, synovectomy  . REVISION TOTAL HIP ARTHROPLASTY Right 03-28-2003    dr Alvan Dame _1   . TOTAL HIP ARTHROPLASTY Bilateral left 11-05-2002  dr Alvan Dame _2 ;   right 06/ 2004  _3     There were no vitals filed for this visit.   Subjective Assessment - 01/17/20 1019    Subjective No new complaints. No falls or pain to report. Does report discomfort in her lower back which causes her to lean over.    Pertinent History BIl THR,  ALLERGIC lisinopril, amoxicillin,  MS,    Limitations Standing;Walking;House hold activities    How long can you sit comfortably? unlimited    How long can you stand comfortably? < 10 min    How long can  you walk comfortably? < 5 minutes    Patient Stated Goals I want to strengthen my LT side and be able to walk on my own.  I can t do anything now.  I want to help my back pain  while I use a walker. It hurts when I lean over    Currently in Pain? No/denies    Pain Score 0-No pain              [facility] PT Assessment - 01/17/20 1026      Standardized Balance Assessment   Standardized Balance Assessment Berg Balance Test;Five Times Sit to Stand    Five times sit to stand comments  17.47 sec's with UE support from standard height chair       Berg Balance Test   Sit to Stand Able to stand  independently using hands    Standing Unsupported Able to stand 30 seconds unsupported   1 minute 38.22 sec's before uncontrolled descent to mat tabl   Sitting with  Back Unsupported but Feet Supported on Floor or Stool Able to sit safely and securely 2 minutes    Stand to Sit Controls descent by using hands    Transfers Able to transfer safely, definite need of hands    Standing Unsupported with Eyes Closed Able to stand 10 seconds with supervision    Standing Unsupported with Feet Together Needs help to attain position but able to stand for 30 seconds with feet together   needed UE support to bring feet together   From Standing, Reach Forward with Outstretched Arm Can reach forward >12 cm safely (5")   6 icnches   From Standing Position, Pick up Object from Floor Able to pick up shoe, needs supervision    From Standing Position, Turn to Look Behind Over each Shoulder Needs supervision when turning   looks head only side to side   Turn 360 Degrees Needs assistance while turning    Standing Unsupported, Alternately Place Feet on Step/Stool Needs assistance to keep from falling or unable to try    Standing Unsupported, One Foot in Front Able to take small step independently and hold 30 seconds    Standing on One Leg Tries to lift leg/unable to hold 3 seconds but remains standing independently    Total Score 29    Berg comment: <36 high risk for falls                [facility] Adult PT Treatment/Exercise - 01/17/20 1026      Transfers   Transfers Sit to Stand;Stand to Sit    Sit to Stand 5: Supervision;4: Min guard    Five time sit to stand comments  --    Stand to Sit 4: Min guard      Ambulation/Gait   Ambulation/Gait Yes    Ambulation/Gait Assistance 4: Min guard    Ambulation/Gait Assistance Details pt using clinic shoes with leather toe cap on them. trialed foot up brace today with no foot catching noted on left foot.     Ambulation Distance (Feet) 120 Feet   x1 with rollator/foot up brace   Assistive device Rollator    Gait Pattern Step-to pattern;Decreased hip/knee flexion - left;Decreased dorsiflexion - left;Left foot flat;Poor foot clearance  - left    Ambulation Surface Level;Indoor    Gait velocity 21.37 sec's= 1.54 ft/sec with rollator and foot up brace on left LE  PT Short Term Goals - 01/03/20 1331      PT SHORT TERM GOAL #1   Title Pt will be able to perform 5 x STS    Baseline 19.69 secs with BUE support    Status Achieved    Target Date 12/17/19      PT SHORT TERM GOAL #2   Title Patient will increase gross bilateral lower extremity strength to at least 4/5    Baseline 01/03/20:left hip/LE remains weak with 3/5 at best    Status Not Met    Target Date --      PT SHORT TERM GOAL #3   Title Patient will be independent with initial HEP     Baseline 01/03/20: pt reports compliance and no issues with HEP    Time --    Period --    Status Achieved    Target Date 12/17/19             PT Long Term Goals - 01/17/20 1728      PT LONG TERM GOAL #1   Title Pt will improve BERG balance score to >/=36/56 in order to indicate dec fall risk.    Baseline 01/17/20: 29/56 scored today    Status Not Met      PT LONG TERM GOAL #2   Title Pt will be evaluated for bracing to enhance ambulation ability due to AFO no longer fitting for foot drop on LT    Baseline 01/17/20: has had consult. Now working to see if foot up brace meets pt's needs. This is ongoing    Status Partially Met      PT LONG TERM GOAL #3   Title Patient will be independent with final HEP.     Baseline 01/17/20: reprorts compliance and no issues with current HEP    Status Achieved      PT LONG TERM GOAL #4   Title Pt will ambulate 100' with LRAD at mod I level (with AFO) in simulate household environment to indicate safe home mobility.    Baseline 01/17/20: met distance portion at supervision level with foot up brace and rollator    Status Partially Met      PT LONG TERM GOAL #5   Title Pt will improve 5TSS to </=15 secs with single UE support only to indicate improve BLE strength.    Baseline 01/17/20: 17.47 sec's  with UE support from standard height surface    Status Not Met      PT LONG TERM GOAL #6   Title Pt will ambulate x 300' over unlevel paved outdoor surfaces w/ LRAD at S level in order to indicate safety community mobility.    Baseline 01/17/20: unable to assess today due to time constraints    Status Unable to assess      PT LONG TERM GOAL #7   Title Pt will improve gait speed to >/=1.79 ft/sec with LRAD in order to indicate dec fall risk.    Baseline 01/17/20: 1.54 ft/sec with rollator    Status Not Met                 Plan - 01/17/20 1021    Clinical Impression Statement Today's skilled session focused on trial use of foot up brace with gait while checking progress toward LTGs. Pt met some goals. She did not meet her Berg Balance test, 5 time sit to stand or gait speed goals, however did demo progress toward them with impoved scores/times. Will need to  further assess effectiveness of foot up brace when pt is fatigued, no issues noted with short distance in session today. The pt is progressing and should benefit from continued PT to progress toward unmet goals.    PT Frequency 2x / week    PT Duration 8 weeks    PT Treatment/Interventions ADLs/Self Care Home Management;Aquatic Therapy;Electrical Stimulation;Moist Heat;Balance training;Therapeutic exercise;Therapeutic activities;Functional mobility training;Stair training;Gait training;Neuromuscular re-education;Patient/family education;Manual techniques;Passive range of motion;Dry needling;Taping    PT Home Exercise Plan [id number]    Consulted and Agree with Plan of Care Patient           Patient will benefit from skilled therapeutic intervention in order to improve the following deficits and impairments:  Abnormal gait, Decreased activity tolerance, Decreased balance, Decreased coordination, Decreased endurance, Decreased range of motion, Decreased strength, Difficulty walking, Increased muscle spasms, Pain, Decreased knowledge  of use of DME, Decreased mobility  Visit Diagnosis: Other abnormalities of gait and mobility  Muscle weakness (generalized)  Repeated falls     Problem List Patient Active Problem List   Diagnosis Date Noted  . Amenorrhea 11/20/2018  . Well woman exam 11/20/2018  . MS (multiple sclerosis) ([place]) 03/14/2018  . CKD (chronic kidney disease), stage III ([place])   . Obesity (BMI 30.0-34.9)   . Anemia 01/09/2018  . Pulmonary embolism ([place]) 09/18/2017  . Elevated serum creatinine 05/12/2017  . Acute encephalopathy 04/20/2017  . Hydronephrosis of left kidney 10/24/2016  . Attention deficit 10/24/2016  . Left leg weakness 08/13/2016  . Chronic pain 08/13/2016  . Mild renal insufficiency 08/13/2016  . Left-sided weakness   . High risk medication use 09/15/2015  . Hip pain, bilateral 07/28/2015  . Urinary hesitancy 07/28/2015  . Multiple sclerosis exacerbation ([place]) 07/17/2015  . Urinary disorder 06/11/2015  . Gait disturbance 05/19/2015  . Numbness 05/19/2015  . Depression with anxiety 05/13/2015  . Other fatigue 05/13/2015  . Left knee pain 05/13/2015  . Avascular necrosis of bones of both hips ([place]) 05/13/2015  . Screening for HIV (human immunodeficiency virus) 07/08/2014  . Essential hypertension, benign 11/19/2013  . Anxiety state, unspecified 11/19/2013  . Screen for STD (sexually transmitted disease) 11/01/2013  . Encounter for routine gynecological examination 11/01/2013  . Oral contraceptive use 10/20/2011  . Multiple sclerosis ([place]) 08/08/2007  . RASH AND OTHER NONSPECIFIC SKIN ERUPTION 06/22/2007  . AVASCULAR NECROSIS, FEMORAL HEAD 03/11/2006  . Essential hypertension 02/03/2006  . 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 DVT, HX OF 02/03/2006    Dawn Peters, PTA, [facility] [address], [place], [place] [address] [phone] 01/18/20, 5:43 PM   Name: Dawn Peters MRN: [id number] Date of Birth: [birth year]

## 2020-01-20 ENCOUNTER — Ambulatory Visit: Payer: Medicare Other | Admitting: Rehabilitation

## 2020-01-20 NOTE — Addendum Note (Signed)
Addended by: Cameron Sprang A on: 01/20/2020 03:43 PM   Modules accepted: Orders

## 2020-01-22 ENCOUNTER — Ambulatory Visit: Payer: Medicare Other | Admitting: Rehabilitation

## 2020-01-24 ENCOUNTER — Other Ambulatory Visit: Payer: Self-pay

## 2020-01-24 ENCOUNTER — Ambulatory Visit: Payer: Medicare Other

## 2020-01-24 DIAGNOSIS — R2689 Other abnormalities of gait and mobility: Secondary | ICD-10-CM

## 2020-01-24 DIAGNOSIS — M6281 Muscle weakness (generalized): Secondary | ICD-10-CM

## 2020-01-24 DIAGNOSIS — R296 Repeated falls: Secondary | ICD-10-CM

## 2020-01-24 NOTE — Therapy (Signed)
[facility] [address] [place], [place], [address] Phone: [phone]   Fax:  [phone]  Physical Therapy Treatment  Patient Details  Name: Dawn Peters MRN: [id number] Date of Birth: February 17, 1981 Referring Provider (PT): Britt Bottom MD   Encounter Date: 01/24/2020   PT End of Session - 01/24/20 1425    Visit Number 9    Number of Visits 24   per updated POC   Date for PT Re-Evaluation 04/17/20   updated POC, cert for 90 days, POC for 60 days   Authorization Type UHC MCR    Progress Note Due on Visit 10    PT Start Time 1325    PT Stop Time 1400    PT Time Calculation (min) 35 min    Equipment Utilized During Treatment Gait belt    Activity Tolerance Patient tolerated treatment well;No increased pain    Behavior During Therapy WFL for tasks assessed/performed           Past Medical History:  Diagnosis Date  . Anticoagulant long-term use    Eliquis  . Anxiety   . Chronic pain   . CKD (chronic kidney disease), stage III ([place])   . Depression   . DVT, lower extremity, recurrent ([place]) 09/17/2017   left lower extremity-- treated with eliquis  . Gait disturbance    due to MS  . GERD (gastroesophageal reflux disease)    watch diet  . 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 History of DVT of lower extremity 2007  . History of encephalopathy 50/2774   acute metabolic encephalopathy secondary to MS,  resolved  . History of MRSA infection 2004   right hip infection post THA  . History of pulmonary embolus (PE) 2005  . Hydronephrosis, left    w/ acute kidney injury 02/ 2019  . Hypertension   . Left-sided weakness    due to MS  . Migraines   . MS (multiple sclerosis) [facility]) neurologist-  dr Renne Musca   dx 2000--  . Muscle spasticity   . Neurogenic bladder    due to MS  . Pulmonary embolism, bilateral ([place]) 09/17/2017   treated  w/ eliquis  . Strains to urinate   . Urgency of urination     Past Surgical History:  Procedure Laterality Date  . CYSTOSCOPY WITH RETROGRADE PYELOGRAM, URETEROSCOPY AND STENT PLACEMENT Bilateral 11/29/2017   Procedure: BILATERAL  RETROGRADE PYELOGRAM, LEFT DIAGNOSIC URETEROSCOPY AND STENT LEFT PLACEMENT;  Surgeon: Ardis Hughs, MD;  Location: [facility];  Service: Urology;  Laterality: Bilateral;  . HIP ARTHROSCOPY Left 07-05-2013   dr pill _0    iliopsosas release, synovectomy  . REVISION TOTAL HIP ARTHROPLASTY Right 03-28-2003    dr Alvan Dame _1   . TOTAL HIP ARTHROPLASTY Bilateral left 11-05-2002  dr Alvan Dame _2 ;   right 06/ 2004  _3     There were no vitals filed for this visit.   Subjective Assessment - 01/24/20 1327    Subjective No new changes/complaints. No falls to report. No pain.    Pertinent History BIl THR,  ALLERGIC lisinopril, amoxicillin,  MS,    Limitations Standing;Walking;House hold activities    How long can you sit comfortably? unlimited    How long can you stand comfortably? < 10 min    How long can you walk comfortably? < 5 minutes    Patient Stated Goals I want  to strengthen my LT side and be able to walk on my own.  I can t do anything now.  I want to help my back pain  while I use a walker. It hurts when I lean over    Currently in Pain? No/denies                      [facility] Adult PT Treatment/Exercise - 01/24/20 1343      Transfers   Transfers Sit to Stand;Stand to Sit    Sit to Stand 5: Supervision    Stand to Sit 4: Min guard    Comments completed sit <> stand from w/c with rollator      Ambulation/Gait   Ambulation/Gait Yes    Ambulation/Gait Assistance 4: Min guard    Ambulation/Gait Assistance Details PT donned clinic foot up brace and use of shoe with toe cap on LLE. Completed ambulation x 345 ft with rollator, followed with 1-2 minutes of rest, adn then followed with ambulation x 230 ft. Patient demo no  instances of L toe catching during session. Patient did require verbal cues for improved hip/knee flexion during ambulation on RLE. CGA throughout. With fatigue, patient still demo improved toe clearance with toe up brace.     Ambulation Distance (Feet) 345 Feet    Assistive device Rollator    Gait Pattern Step-to pattern;Decreased hip/knee flexion - left;Decreased dorsiflexion - left;Left foot flat;Poor foot clearance - left    Ambulation Surface Level;Indoor    Stairs Yes    Stairs Assistance 4: Min guard    Stairs Assistance Details (indicate cue type and reason) patient ascend/descend 8 stairs, alteranting pattern from reciprocal stepping to step to pattern incosistently. CGA throughout    Stair Management Technique Two rails;Alternating pattern;Step to pattern;Forwards    Number of Stairs 8    Height of Stairs 6      Self-Care   Self-Care Other Self-Care Comments    Other Self-Care Comments  Patietn requesting if insurance would cover foot up brace, PT educating that due to coverage of prior AFO in past that they may denie brace coverage. PT reporting that she will reach out to [facility] and determine procedure for going forwards.                PT Short Term Goals - 01/20/20 1531      PT SHORT TERM GOAL #1   Title Pt will perform 5TSS in </=15 secs with single UE support only to indicate improved BLE strength.  (Target Date:02/17/20)    Baseline 17.47 with UE support    Time 4    Period Weeks    Status Revised    Target Date 02/17/20      PT SHORT TERM GOAL #2   Title Pt will improve BERG balance score to >/=33/56 in order to indicate dec fall risk.    Baseline 29/56    Time 4    Period Weeks    Status Revised      PT SHORT TERM GOAL #3   Title Pt will be independent with ongoing HEP in order to indicate improved strength and dec fall risk.    Time 4    Period Weeks    Status Revised    Target Date 12/17/19      PT SHORT TERM GOAL #4   Title Pt will improve  gait speed to >/=2.00 ft/sec with 4 wheeled rollator (and bracing as needed) in order to indicate  dec fall risk.    Time 4    Period Weeks    Status New             PT Long Term Goals - 01/20/20 1536      PT LONG TERM GOAL #1   Title Pt will improve BERG balance score to >/=37/56 in order to indicate dec fall risk. (Target date: 03/19/19)    Baseline 01/17/20: 29/56 scored today    Time 8    Period Weeks    Status Revised    Target Date 03/18/20      PT LONG TERM GOAL #2   Title Will continue to assess if foot up brace will be enough support when fatigued to prevent foot drop on LLE and dec fall risk.    Baseline 01/17/20: has had consult. Now working to see if foot up brace meets pt's needs. This is ongoing    Time 8    Period Weeks    Status Revised      PT LONG TERM GOAL #3   Title Patient will be independent with final HEP.     Baseline 01/17/20: reprorts compliance and no issues with current HEP    Time 8    Period Weeks    Status On-going      PT LONG TERM GOAL #4   Title Pt will ambulate 100' with LRAD at mod I level (with AFO) in simulate household environment to indicate safe home mobility.    Baseline 01/17/20: met distance portion at supervision level with foot up brace and rollator    Time 8    Period Weeks    Status On-going      PT LONG TERM GOAL #5   Title Pt will improve 5TSS to </=12 secs with single UE support only to indicate improve BLE strength.    Baseline 01/17/20: 17.47 sec's with UE support from standard height surface    Time 8    Period Weeks    Status Revised      PT LONG TERM GOAL #6   Title Pt will ambulate x 300' over unlevel paved outdoor surfaces w/ LRAD at S level in order to indicate safety community mobility.    Baseline 01/17/20: unable to assess today due to time constraints    Time 8    Period Weeks    Status On-going      PT LONG TERM GOAL #7   Title Pt will improve gait speed to >/=2.3 ft/sec with LRAD in order to  indicate dec fall risk.    Baseline 01/17/20: 1.54 ft/sec with rollator    Time 8    Period Weeks    Status Revised                 Plan - 01/24/20 1426    Clinical Impression Statement Continued trial use of foot up brace with gait, working on enducing fatigue to determine if brace was still benefical for patient during ambulation. Patient continue to demo reduced instances of catching L toe with brace donned. Pt requesting for insurance to pay for brace, PT reporting that she will reach out to [facility] to determine plan for going forward. Will continue per POC.    PT Frequency 2x / week    PT Duration 8 weeks    PT Treatment/Interventions ADLs/Self Care Home Management;Aquatic Therapy;Electrical Stimulation;Moist Heat;Balance training;Therapeutic exercise;Therapeutic activities;Functional mobility training;Stair training;Gait training;Neuromuscular re-education;Patient/family education;Manual techniques;Passive range of motion;Dry needling;Taping    PT Next  Visit Plan Provide information on Foot Up Brace. continue ambulation with clinic brace until patient purchases personal.    PT Home Exercise Plan [id number]    Consulted and Agree with Plan of Care Patient           Patient will benefit from skilled therapeutic intervention in order to improve the following deficits and impairments:  Abnormal gait, Decreased activity tolerance, Decreased balance, Decreased coordination, Decreased endurance, Decreased range of motion, Decreased strength, Difficulty walking, Increased muscle spasms, Pain, Decreased knowledge of use of DME, Decreased mobility  Visit Diagnosis: Other abnormalities of gait and mobility  Muscle weakness (generalized)  Repeated falls     Problem List Patient Active Problem List   Diagnosis Date Noted  . Amenorrhea 11/20/2018  . Well woman exam 11/20/2018  . MS (multiple sclerosis) ([place]) 03/14/2018  . CKD (chronic kidney disease), stage III ([place])   .  Obesity (BMI 30.0-34.9)   . Anemia 01/09/2018  . Pulmonary embolism ([place]) 09/18/2017  . Elevated serum creatinine 05/12/2017  . Acute encephalopathy 04/20/2017  . Hydronephrosis of left kidney 10/24/2016  . Attention deficit 10/24/2016  . Left leg weakness 08/13/2016  . Chronic pain 08/13/2016  . Mild renal insufficiency 08/13/2016  . Left-sided weakness   . High risk medication use 09/15/2015  . Hip pain, bilateral 07/28/2015  . Urinary hesitancy 07/28/2015  . Multiple sclerosis exacerbation ([place]) 07/17/2015  . Urinary disorder 06/11/2015  . Gait disturbance 05/19/2015  . Numbness 05/19/2015  . Depression with anxiety 05/13/2015  . Other fatigue 05/13/2015  . Left knee pain 05/13/2015  . Avascular necrosis of bones of both hips ([place]) 05/13/2015  . Screening for HIV (human immunodeficiency virus) 07/08/2014  . Essential hypertension, benign 11/19/2013  . Anxiety state, unspecified 11/19/2013  . Screen for STD (sexually transmitted disease) 11/01/2013  . Encounter for routine gynecological examination 11/01/2013  . Oral contraceptive use 10/20/2011  . Multiple sclerosis ([place]) 08/08/2007  . RASH AND OTHER NONSPECIFIC SKIN ERUPTION 06/22/2007  . AVASCULAR NECROSIS, FEMORAL HEAD 03/11/2006  . Essential hypertension 02/03/2006  . 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 DVT, HX OF 02/03/2006    Jones Bales, PT, DPT 01/24/2020, 2:28 PM  [facility] [address] [place], [place], [address] Phone: [phone]   Fax:  [phone]  Name: Vieva Brummitt MRN: [id number] Date of Birth: [birth year]

## 2020-01-29 ENCOUNTER — Other Ambulatory Visit: Payer: Self-pay

## 2020-01-29 ENCOUNTER — Ambulatory Visit: Payer: Medicare Other

## 2020-01-29 DIAGNOSIS — R2689 Other abnormalities of gait and mobility: Secondary | ICD-10-CM

## 2020-01-29 DIAGNOSIS — R296 Repeated falls: Secondary | ICD-10-CM

## 2020-01-29 DIAGNOSIS — M6281 Muscle weakness (generalized): Secondary | ICD-10-CM

## 2020-01-29 NOTE — Therapy (Signed)
[facility] [address] [place], [place], [address] Phone: [phone]   Fax:  [phone]  Physical Therapy Treatment/Progress Note  Patient Details  Name: Dawn Peters MRN: [id number] Date of Birth: [birth year] Referring Provider (PT): Britt Bottom MD Physical Therapy Progress Note   Dates of Reporting Period: 11/19/19 - 01/29/20  See Note below for Objective Data and Assessment of Progress/Goals.  Thank you for the referral of this patient. Guillermina [place], PT, DPT   Encounter Date: 01/29/2020   PT End of Session - 01/29/20 1315    Visit Number 10    Number of Visits 24   per updated POC   Date for PT Re-Evaluation 04/17/20   updated POC, cert for 90 days, POC for 60 days   Authorization Type UHC MCR    Progress Note Due on Visit 10    PT Start Time 9381    PT Stop Time 1315    PT Time Calculation (min) 29 min    Equipment Utilized During Treatment Gait belt    Activity Tolerance Patient tolerated treatment well;No increased pain    Behavior During Therapy WFL for tasks assessed/performed           Past Medical History:  Diagnosis Date  . Anticoagulant long-term use    Eliquis  . Anxiety   . Chronic pain   . CKD (chronic kidney disease), stage III ([place])   . Depression   . DVT, lower extremity, recurrent ([place]) 09/17/2017   left lower extremity-- treated with eliquis  . Gait disturbance    due to MS  . GERD (gastroesophageal reflux disease)    watch diet  . 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 History of DVT of lower extremity 2007  . History of encephalopathy 82/9937   acute metabolic encephalopathy secondary to MS,  resolved  . History of MRSA infection 2004   right hip infection post THA  . History of pulmonary embolus (PE) 2005  . Hydronephrosis, left    w/ acute kidney injury 02/ 2019  . Hypertension   .  Left-sided weakness    due to MS  . Migraines   . MS (multiple sclerosis) [facility]) neurologist-  dr Renne Musca   dx 2000--  . Muscle spasticity   . Neurogenic bladder    due to MS  . Pulmonary embolism, bilateral ([place]) 09/17/2017   treated w/ eliquis  . Strains to urinate   . Urgency of urination     Past Surgical History:  Procedure Laterality Date  . CYSTOSCOPY WITH RETROGRADE PYELOGRAM, URETEROSCOPY AND STENT PLACEMENT Bilateral 11/29/2017   Procedure: BILATERAL  RETROGRADE PYELOGRAM, LEFT DIAGNOSIC URETEROSCOPY AND STENT LEFT PLACEMENT;  Surgeon: Ardis Hughs, MD;  Location: [facility];  Service: Urology;  Laterality: Bilateral;  . HIP ARTHROSCOPY Left 07-05-2013   dr pill _0    iliopsosas release, synovectomy  . REVISION TOTAL HIP ARTHROPLASTY Right 03-28-2003    dr Alvan Dame _1   . TOTAL HIP ARTHROPLASTY Bilateral left 11-05-2002  dr Alvan Dame _2 ;   right 06/ 2004  _3     There were no vitals filed for this visit.   Subjective Assessment - 01/29/20 1248    Subjective Patient reports no changes. No falls. No pain.    Pertinent History BIl THR,  ALLERGIC lisinopril, amoxicillin,  MS,    Limitations Standing;Walking;House hold activities    How long  can you sit comfortably? unlimited    How long can you stand comfortably? < 10 min    How long can you walk comfortably? < 5 minutes    Patient Stated Goals I want to strengthen my LT side and be able to walk on my own.  I can t do anything now.  I want to help my back pain  while I use a walker. It hurts when I lean over    Currently in Pain? No/denies              [facility] Adult PT Treatment/Exercise - 01/29/20 0001      Transfers   Transfers Sit to Stand;Stand to Sit    Sit to Stand 5: Supervision    Stand to Sit 5: Supervision    Comments completed transfer from mat <> w/c, with CGA from PT      Self-Care   Self-Care Other Self-Care Comments    Other Self-Care Comments  PT educating that will need new  MD order and face to face visit to obtain foot up brace. Patient stating that she would purhcase brace out of pocket. PT providing patient information sheet on purchase options and size that has been utilized during PT treatments.       Knee/Hip Exercises: Standing   Heel Raises Both;2 sets;10 reps;Limitations    Heel Raises Limitations completed with BUE support from chair      Knee/Hip Exercises: Seated   Long Arc Quad AROM;Strengthening;Both;2 sets;10 reps;Limitations    Long Arc Quad Limitations compeleted without weight, verbal cues for slow controlled movement    Marching Strengthening;Both;1 set;10 reps;Limitations    Marching Limitations completed in seated position, min A at times for LLE. increased challenge but improved with increased reps    Hamstring Curl AROM;Strengthening;2 sets;10 reps;Limitations    Hamstring Limitations completed with red theraband, verbal cues for control and slow movement    Abduction/Adduction  Strengthening;Both;2 sets;10 reps;Limitations    Abd/Adduction Limitations completed with ball; isometric 3 second hold. vebral cues for technique                  PT Education - 01/29/20 1255    Education Details educated on purchase of Foot Up Brace    Person(s) Educated Patient    Methods Explanation    Comprehension Verbalized understanding            PT Short Term Goals - 01/20/20 1531      PT SHORT TERM GOAL #1   Title Pt will perform 5TSS in </=15 secs with single UE support only to indicate improved BLE strength.  (Target Date:02/17/20)    Baseline 17.47 with UE support    Time 4    Period Weeks    Status Revised    Target Date 02/17/20      PT SHORT TERM GOAL #2   Title Pt will improve BERG balance score to >/=33/56 in order to indicate dec fall risk.    Baseline 29/56    Time 4    Period Weeks    Status Revised      PT SHORT TERM GOAL #3   Title Pt will be independent with ongoing HEP in order to indicate improved strength  and dec fall risk.    Time 4    Period Weeks    Status Revised    Target Date 12/17/19      PT SHORT TERM GOAL #4   Title Pt will improve gait speed to >/=  2.00 ft/sec with 4 wheeled rollator (and bracing as needed) in order to indicate dec fall risk.    Time 4    Period Weeks    Status New             PT Long Term Goals - 01/20/20 1536      PT LONG TERM GOAL #1   Title Pt will improve BERG balance score to >/=37/56 in order to indicate dec fall risk. (Target date: 03/19/19)    Baseline 01/17/20: 29/56 scored today    Time 8    Period Weeks    Status Revised    Target Date 03/18/20      PT LONG TERM GOAL #2   Title Will continue to assess if foot up brace will be enough support when fatigued to prevent foot drop on LLE and dec fall risk.    Baseline 01/17/20: has had consult. Now working to see if foot up brace meets pt's needs. This is ongoing    Time 8    Period Weeks    Status Revised      PT LONG TERM GOAL #3   Title Patient will be independent with final HEP.     Baseline 01/17/20: reprorts compliance and no issues with current HEP    Time 8    Period Weeks    Status On-going      PT LONG TERM GOAL #4   Title Pt will ambulate 100' with LRAD at mod I level (with AFO) in simulate household environment to indicate safe home mobility.    Baseline 01/17/20: met distance portion at supervision level with foot up brace and rollator    Time 8    Period Weeks    Status On-going      PT LONG TERM GOAL #5   Title Pt will improve 5TSS to </=12 secs with single UE support only to indicate improve BLE strength.    Baseline 01/17/20: 17.47 sec's with UE support from standard height surface    Time 8    Period Weeks    Status Revised      PT LONG TERM GOAL #6   Title Pt will ambulate x 300' over unlevel paved outdoor surfaces w/ LRAD at S level in order to indicate safety community mobility.    Baseline 01/17/20: unable to assess today due to time constraints    Time 8      Period Weeks    Status On-going      PT LONG TERM GOAL #7   Title Pt will improve gait speed to >/=2.3 ft/sec with LRAD in order to indicate dec fall risk.    Baseline 01/17/20: 1.54 ft/sec with rollator    Time 8    Period Weeks    Status Revised                 Plan - 01/29/20 1538    Clinical Impression Statement Session limited due to patient arriving late. PT educating on purchase options for Foot Up Brace as patient is preering to purchase vs. go through insurance at this time. Continued seated/standing strengthening activites as tolerated, increased fatigue noted in LLE > RLE. Patient continues to make progress with PT services, and is progressing well toward LTG. Will continue per POC.    PT Frequency 2x / week    PT Duration 8 weeks    PT Treatment/Interventions ADLs/Self Care Home Management;Aquatic Therapy;Electrical Stimulation;Moist Heat;Balance training;Therapeutic exercise;Therapeutic activities;Functional mobility training;Stair training;Gait training;Neuromuscular re-education;Patient/family education;Manual  techniques;Passive range of motion;Dry needling;Taping    PT Next Visit Plan Continue ambulation with clinic brace until patient purchases personal. Complete stair training. Continue seated/standing strengthening.    PT Home Exercise Plan [id number]    Consulted and Agree with Plan of Care Patient           Patient will benefit from skilled therapeutic intervention in order to improve the following deficits and impairments:  Abnormal gait, Decreased activity tolerance, Decreased balance, Decreased coordination, Decreased endurance, Decreased range of motion, Decreased strength, Difficulty walking, Increased muscle spasms, Pain, Decreased knowledge of use of DME, Decreased mobility  Visit Diagnosis: Other abnormalities of gait and mobility  Muscle weakness (generalized)  Repeated falls     Problem List Patient Active Problem List   Diagnosis Date  Noted  . Amenorrhea 11/20/2018  . Well woman exam 11/20/2018  . MS (multiple sclerosis) ([place]) 03/14/2018  . CKD (chronic kidney disease), stage III ([place])   . Obesity (BMI 30.0-34.9)   . Anemia 01/09/2018  . Pulmonary embolism ([place]) 09/18/2017  . Elevated serum creatinine 05/12/2017  . Acute encephalopathy 04/20/2017  . Hydronephrosis of left kidney 10/24/2016  . Attention deficit 10/24/2016  . Left leg weakness 08/13/2016  . Chronic pain 08/13/2016  . Mild renal insufficiency 08/13/2016  . Left-sided weakness   . High risk medication use 09/15/2015  . Hip pain, bilateral 07/28/2015  . Urinary hesitancy 07/28/2015  . Multiple sclerosis exacerbation ([place]) 07/17/2015  . Urinary disorder 06/11/2015  . Gait disturbance 05/19/2015  . Numbness 05/19/2015  . Depression with anxiety 05/13/2015  . Other fatigue 05/13/2015  . Left knee pain 05/13/2015  . Avascular necrosis of bones of both hips ([place]) 05/13/2015  . Screening for HIV (human immunodeficiency virus) 07/08/2014  . Essential hypertension, benign 11/19/2013  . Anxiety state, unspecified 11/19/2013  . Screen for STD (sexually transmitted disease) 11/01/2013  . Encounter for routine gynecological examination 11/01/2013  . Oral contraceptive use 10/20/2011  . Multiple sclerosis ([place]) 08/08/2007  . RASH AND OTHER NONSPECIFIC SKIN ERUPTION 06/22/2007  . AVASCULAR NECROSIS, FEMORAL HEAD 03/11/2006  . Essential hypertension 02/03/2006  . 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 DVT, HX OF 02/03/2006    Jones Bales, PT, DPT 01/29/2020, 3:42 PM  [facility] [address] [place], [place], [address] Phone: [phone]   Fax:  [phone]  Name: Nawal Burling MRN: [id number] Date of Birth: 11-03-1980

## 2020-02-04 ENCOUNTER — Ambulatory Visit: Payer: Medicare Other | Admitting: Physical Therapy

## 2020-02-05 ENCOUNTER — Telehealth: Payer: Self-pay | Admitting: Neurology

## 2020-02-05 ENCOUNTER — Encounter: Payer: Self-pay | Admitting: Rehabilitation

## 2020-02-05 ENCOUNTER — Ambulatory Visit: Payer: Medicare Other | Attending: Neurology | Admitting: Rehabilitation

## 2020-02-05 ENCOUNTER — Other Ambulatory Visit: Payer: Self-pay

## 2020-02-05 ENCOUNTER — Other Ambulatory Visit: Payer: Self-pay | Admitting: Family Medicine

## 2020-02-05 DIAGNOSIS — R269 Unspecified abnormalities of gait and mobility: Secondary | ICD-10-CM | POA: Diagnosis present

## 2020-02-05 DIAGNOSIS — R296 Repeated falls: Secondary | ICD-10-CM | POA: Insufficient documentation

## 2020-02-05 DIAGNOSIS — M6281 Muscle weakness (generalized): Secondary | ICD-10-CM | POA: Diagnosis present

## 2020-02-05 DIAGNOSIS — R2689 Other abnormalities of gait and mobility: Secondary | ICD-10-CM | POA: Insufficient documentation

## 2020-02-05 DIAGNOSIS — Z1231 Encounter for screening mammogram for malignant neoplasm of breast: Secondary | ICD-10-CM

## 2020-02-05 MED ORDER — AMPHETAMINE-DEXTROAMPHET ER 20 MG PO CP24: 20.0000 mg | ORAL_CAPSULE | Freq: Every day | ORAL | 0 refills | Status: DC

## 2020-02-05 NOTE — Telephone Encounter (Signed)
Pt last set of [place] monthly labs drawn 01/06/20. Gave to MD to review.

## 2020-02-05 NOTE — Therapy (Signed)
[facility] [address] [place], [place], [address] Phone: [phone]   Fax:  [phone]  Physical Therapy Treatment  Patient Details  Name: Dawn Peters MRN: [id number] Date of Birth: 21-Oct-1980 Referring Provider (PT): Britt Bottom MD   Encounter Date: 02/05/2020   PT End of Session - 02/05/20 1804    Visit Number 11    Number of Visits 24   per updated POC   Date for PT Re-Evaluation 04/17/20   updated POC, cert for 90 days, POC for 60 days   Authorization Type UHC MCR    Progress Note Due on Visit 10    PT Start Time 1708   pt late to session   PT Stop Time 1745    PT Time Calculation (min) 37 min    Equipment Utilized During Treatment Gait belt    Activity Tolerance Patient tolerated treatment well;No increased pain    Behavior During Therapy WFL for tasks assessed/performed           Past Medical History:  Diagnosis Date  . Anticoagulant long-term use    Eliquis  . Anxiety   . Chronic pain   . CKD (chronic kidney disease), stage III ([place])   . Depression   . DVT, lower extremity, recurrent ([place]) 09/17/2017   left lower extremity-- treated with eliquis  . Gait disturbance    due to MS  . GERD (gastroesophageal reflux disease)    watch diet  . 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 History of DVT of lower extremity 2007  . History of encephalopathy 54/2706   acute metabolic encephalopathy secondary to MS,  resolved  . History of MRSA infection 2004   right hip infection post THA  . History of pulmonary embolus (PE) 2005  . Hydronephrosis, left    w/ acute kidney injury 02/ 2019  . Hypertension   . Left-sided weakness    due to MS  . Migraines   . MS (multiple sclerosis) [facility]) neurologist-  dr Renne Musca   dx 2000--  . Muscle spasticity   . Neurogenic bladder    due to MS  . Pulmonary embolism, bilateral ([place])  09/17/2017   treated w/ eliquis  . Strains to urinate   . Urgency of urination     Past Surgical History:  Procedure Laterality Date  . CYSTOSCOPY WITH RETROGRADE PYELOGRAM, URETEROSCOPY AND STENT PLACEMENT Bilateral 11/29/2017   Procedure: BILATERAL  RETROGRADE PYELOGRAM, LEFT DIAGNOSIC URETEROSCOPY AND STENT LEFT PLACEMENT;  Surgeon: Ardis Hughs, MD;  Location[facility];  Service: Urology;  Laterality: Bilateral;  . HIP ARTHROSCOPY Left 07-05-2013   dr pill _0    iliopsosas release, synovectomy  . REVISION TOTAL HIP ARTHROPLASTY Right 03-28-2003    dr Alvan Dame _1   . TOTAL HIP ARTHROPLASTY Bilateral left 11-05-2002  dr Alvan Dame _2 ;   right 06/ 2004  _3     There were no vitals filed for this visit.   Subjective Assessment - 02/05/20 1804    Subjective Pt reports no changes, has not gotten rollator yet nor has she gotten shoes/brace.    Pertinent History BIl THR,  ALLERGIC lisinopril, amoxicillin,  MS,    Patient Stated Goals I want to strengthen my LT side and be able to walk on my own.  I can t do anything now.  I want to help my back pain  while I  use a walker. It hurts when I lean over    Currently in Pain? No/denies                             [facility] Adult PT Treatment/Exercise - 02/05/20 1726      Transfers   Transfers Sit to Stand;Stand to Sit    Sit to Stand 5: Supervision    Stand to Sit 5: Supervision    Comments heavy reliance on UEs.      Ambulation/Gait   Ambulation/Gait Yes    Ambulation/Gait Assistance 5: Supervision    Ambulation/Gait Assistance Details Ambulatory with clinic rollator and foot up brace/shoes as she came today with [facility] and wearing crocs.  Continue to provide education on safety with use of rollator (even if its 3 wheeled) until she gets the 4 wheeled.  Pt reports she/husband will get rollator and shoes tomorrow and order brace soon.  Cues for upright posture throughout.  Did note intermittent L foot  drag, but did better with cuing.  Educated and demonstrated to husband during session on how to don and ensure proper fit.  Both verbalized understanding.      Ambulation Distance (Feet) 230 Feet    Assistive device Rollator    Gait Pattern Step-to pattern;Decreased hip/knee flexion - left;Decreased dorsiflexion - left;Left foot flat;Poor foot clearance - left    Ambulation Surface Level;Indoor      Neuro Re-ed    Neuro Re-ed Details  Attempted standing on small rocker board in // bars biased vertically maintaning balance then working on limits of stability with forward/backward weight shifts, however this was extremely difficult for pt as she tends to lock knees out and keep hips posterior.        Exercises   Exercises Other Exercises    Other Exercises  In // bars, forward stepping over black balance beams (2) landing each foot in a forward space x 5 laps with UE support and cues for posture.  Progressed to lateral stepping over beams x 5 laps with heavy reliance of UEs and max cues for improved hip extension.  Ended session with supine hip flex stretch off EOM x 1 min on each side. Forward knee kicks (knee flexion) x 4 laps with UE support, side stepping in mini squat position x 4 laps.  Educated on how to do this at home from couch as bed is high.                      PT Short Term Goals - 01/20/20 1531      PT SHORT TERM GOAL #1   Title Pt will perform 5TSS in </=15 secs with single UE support only to indicate improved BLE strength.  (Target Date:02/17/20)    Baseline 17.47 with UE support    Time 4    Period Weeks    Status Revised    Target Date 02/17/20      PT SHORT TERM GOAL #2   Title Pt will improve BERG balance score to >/=33/56 in order to indicate dec fall risk.    Baseline 29/56    Time 4    Period Weeks    Status Revised      PT SHORT TERM GOAL #3   Title Pt will be independent with ongoing HEP in order to indicate improved strength and dec fall risk.     Time 4    Period Weeks  Status Revised    Target Date 12/17/19      PT SHORT TERM GOAL #4   Title Pt will improve gait speed to >/=2.00 ft/sec with 4 wheeled rollator (and bracing as needed) in order to indicate dec fall risk.    Time 4    Period Weeks    Status New             PT Long Term Goals - 01/20/20 1536      PT LONG TERM GOAL #1   Title Pt will improve BERG balance score to >/=37/56 in order to indicate dec fall risk. (Target date: 03/19/19)    Baseline 01/17/20: 29/56 scored today    Time 8    Period Weeks    Status Revised    Target Date 03/18/20      PT LONG TERM GOAL #2   Title Will continue to assess if foot up brace will be enough support when fatigued to prevent foot drop on LLE and dec fall risk.    Baseline 01/17/20: has had consult. Now working to see if foot up brace meets pt's needs. This is ongoing    Time 8    Period Weeks    Status Revised      PT LONG TERM GOAL #3   Title Patient will be independent with final HEP.     Baseline 01/17/20: reprorts compliance and no issues with current HEP    Time 8    Period Weeks    Status On-going      PT LONG TERM GOAL #4   Title Pt will ambulate 100' with LRAD at mod I level (with AFO) in simulate household environment to indicate safe home mobility.    Baseline 01/17/20: met distance portion at supervision level with foot up brace and rollator    Time 8    Period Weeks    Status On-going      PT LONG TERM GOAL #5   Title Pt will improve 5TSS to </=12 secs with single UE support only to indicate improve BLE strength.    Baseline 01/17/20: 17.47 sec's with UE support from standard height surface    Time 8    Period Weeks    Status Revised      PT LONG TERM GOAL #6   Title Pt will ambulate x 300' over unlevel paved outdoor surfaces w/ LRAD at S level in order to indicate safety community mobility.    Baseline 01/17/20: unable to assess today due to time constraints    Time 8    Period Weeks     Status On-going      PT LONG TERM GOAL #7   Title Pt will improve gait speed to >/=2.3 ft/sec with LRAD in order to indicate dec fall risk.    Baseline 01/17/20: 1.54 ft/sec with rollator    Time 8    Period Weeks    Status Revised                 Plan - 02/05/20 1805    Clinical Impression Statement Session focused on standing strengthening, gait training with rollator and foot up brace and mild balance exercises.  Pt with max difficulty during balance tasks due to max need for UE support/LE weakness.    PT Frequency 2x / week    PT Duration 8 weeks    PT Treatment/Interventions ADLs/Self Care Home Management;Aquatic Therapy;Electrical Stimulation;Moist Heat;Balance training;Therapeutic exercise;Therapeutic activities;Functional mobility training;Stair training;Gait training;Neuromuscular re-education;Patient/family education;Manual techniques;Passive  range of motion;Dry needling;Taping    PT Next Visit Plan Continue ambulation with clinic brace until patient purchases personal. Complete stair training. Continue seated/standing strengthening.    PT Home Exercise Plan [id number]    Consulted and Agree with Plan of Care Patient           Patient will benefit from skilled therapeutic intervention in order to improve the following deficits and impairments:  Abnormal gait, Decreased activity tolerance, Decreased balance, Decreased coordination, Decreased endurance, Decreased range of motion, Decreased strength, Difficulty walking, Increased muscle spasms, Pain, Decreased knowledge of use of DME, Decreased mobility  Visit Diagnosis: Other abnormalities of gait and mobility  Muscle weakness (generalized)  Repeated falls     Problem List Patient Active Problem List   Diagnosis Date Noted  . Amenorrhea 11/20/2018  . Well woman exam 11/20/2018  . MS (multiple sclerosis) ([place]) 03/14/2018  . CKD (chronic kidney disease), stage III ([place])   . Obesity (BMI 30.0-34.9)   . Anemia  01/09/2018  . Pulmonary embolism ([place]) 09/18/2017  . Elevated serum creatinine 05/12/2017  . Acute encephalopathy 04/20/2017  . Hydronephrosis of left kidney 10/24/2016  . Attention deficit 10/24/2016  . Left leg weakness 08/13/2016  . Chronic pain 08/13/2016  . Mild renal insufficiency 08/13/2016  . Left-sided weakness   . High risk medication use 09/15/2015  . Hip pain, bilateral 07/28/2015  . Urinary hesitancy 07/28/2015  . Multiple sclerosis exacerbation ([place]) 07/17/2015  . Urinary disorder 06/11/2015  . Gait disturbance 05/19/2015  . Numbness 05/19/2015  . Depression with anxiety 05/13/2015  . Other fatigue 05/13/2015  . Left knee pain 05/13/2015  . Avascular necrosis of bones of both hips ([place]) 05/13/2015  . Screening for HIV (human immunodeficiency virus) 07/08/2014  . Essential hypertension, benign 11/19/2013  . Anxiety state, unspecified 11/19/2013  . Screen for STD (sexually transmitted disease) 11/01/2013  . Encounter for routine gynecological examination 11/01/2013  . Oral contraceptive use 10/20/2011  . Multiple sclerosis ([place]) 08/08/2007  . RASH AND OTHER NONSPECIFIC SKIN ERUPTION 06/22/2007  . AVASCULAR NECROSIS, FEMORAL HEAD 03/11/2006  . Essential hypertension 02/03/2006  . 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 DVT, HX OF 02/03/2006    Cameron Sprang, PT, MPT [facility] [address] [place], [place], [address] Phone: [phone]   Fax:  [phone] 02/05/20, 6:07 PM  Name: Dawn Peters MRN: [id number] Date of Birth: Feb 16, 1981

## 2020-02-05 NOTE — Telephone Encounter (Addendum)
Per Dr. Felecia Shelling, labs were ok. No significant changes. She asked what her creatinine level was. Advised it was 1.24 from 01/06/20.  She also asked for refill on her adderall. I checked drug registry. She last refilled 11/20/19 #30. Next follow: 04/29/20. Last seen: 06/27/19.

## 2020-02-05 NOTE — Telephone Encounter (Signed)
Pt called to get results for blood work done in October. Would like a call from the nurse.

## 2020-02-06 ENCOUNTER — Encounter: Payer: Self-pay | Admitting: Physical Therapy

## 2020-02-06 ENCOUNTER — Ambulatory Visit: Payer: Medicare Other | Admitting: Physical Therapy

## 2020-02-06 DIAGNOSIS — R2689 Other abnormalities of gait and mobility: Secondary | ICD-10-CM

## 2020-02-06 DIAGNOSIS — M6281 Muscle weakness (generalized): Secondary | ICD-10-CM

## 2020-02-06 DIAGNOSIS — R296 Repeated falls: Secondary | ICD-10-CM

## 2020-02-07 NOTE — Therapy (Signed)
Dawn Peters [address] [place], [place], [address] Phone: [phone]   Fax:  [phone]  Physical Therapy Treatment  Patient Details  Name: Dawn Peters MRN: [id number] Date of Birth: May 05, 1980 Referring Provider (PT): Britt Bottom MD   Encounter Date: 02/06/2020   PT End of Session - 02/06/20 1239    Visit Number 12    Number of Visits 24   per updated POC   Date for PT Re-Evaluation 04/17/20   updated POC, cert for 90 days, POC for 60 days   Authorization Type [employer] [facility]    Progress Note Due on Visit 20    PT Start Time 1237   pt running late   PT Stop Time 1315    PT Time Calculation (min) 38 min    Equipment Utilized During Treatment Gait belt    Activity Tolerance Patient tolerated treatment well;No increased pain    Behavior During Therapy WFL for tasks assessed/performed           Past Medical History:  Diagnosis Date  . Anticoagulant long-term use    Eliquis  . Anxiety   . Chronic pain   . CKD (chronic kidney disease), stage III ([place])   . Depression   . DVT, lower extremity, recurrent ([place]) 09/17/2017   left lower extremity-- treated with eliquis  . Gait disturbance    due to MS  . GERD (gastroesophageal reflux disease)    watch diet  . 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 History of DVT of lower extremity 2007  . History of encephalopathy 69/6295   acute metabolic encephalopathy secondary to MS,  resolved  . History of MRSA infection 2004   right hip infection post THA  . History of pulmonary embolus (PE) 2005  . Hydronephrosis, left    w/ acute kidney injury 02/ 2019  . Hypertension   . Left-sided weakness    due to MS  . Migraines   . MS (multiple sclerosis) [facility]) neurologist-  dr Renne Musca   dx 2000--  . Muscle spasticity   . Neurogenic bladder    due to MS  . Pulmonary embolism, bilateral ([place])  09/17/2017   treated w/ eliquis  . Strains to urinate   . Urgency of urination     Past Surgical History:  Procedure Laterality Date  . CYSTOSCOPY WITH RETROGRADE PYELOGRAM, URETEROSCOPY AND STENT PLACEMENT Bilateral 11/29/2017   Procedure: BILATERAL  RETROGRADE PYELOGRAM, LEFT DIAGNOSIC URETEROSCOPY AND STENT LEFT PLACEMENT;  Surgeon: Ardis Hughs, MD;  Location: [facility];  Service: Urology;  Laterality: Bilateral;  . HIP ARTHROSCOPY Left 07-05-2013   dr pill $Remov'@[facility]'BsrVYb$    iliopsosas release, synovectomy  . REVISION TOTAL HIP ARTHROPLASTY Right 03-28-2003    dr Alvan Dame $RemoveBe'@[facility]'DKIxFaJhJ$   . TOTAL HIP ARTHROPLASTY Bilateral left 11-05-2002  dr Alvan Dame $RemoveBe'@[facility]'MMVXUoDlj$ ;   right 06/ 2004  $Rem'@[facility]'xABm$     There were no vitals filed for this visit.   Subjective Assessment - 02/06/20 1238    Subjective No new complaints. No changes since yesterday. Has not gotten the rollator, her appt here was too early for them to go get it. Has not ordered her shoes or brace either. No falls. or pain to report.    Pertinent History BIl THR,  ALLERGIC lisinopril, amoxicillin,  MS,    Limitations Standing;Walking;House hold activities    How long can you sit comfortably?  unlimited    How long can you stand comfortably? < 10 min    How long can you walk comfortably? < 5 minutes    Patient Stated Goals I want to strengthen my LT side and be able to walk on my own.  I can t do anything now.  I want to help my back pain  while I use a walker. It hurts when I lean over    Currently in Pain? No/denies    Pain Score 0-No pain               [facility] Adult PT Treatment/Exercise - 02/06/20 1240      Transfers   Transfers Sit to Stand;Stand to Sit    Sit to Stand 5: Supervision;With upper extremity assist;From chair/3-in-1;From bed    Stand to Sit 5: Supervision;With upper extremity assist;To chair/3-in-1;To bed      Ambulation/Gait   Ambulation/Gait Yes    Ambulation/Gait Assistance 5: Supervision    Ambulation/Gait  Assistance Details with clinic foot up brace and rollator. toe catching x1 on 1st lap needing up to min assit to correct. added simulated toe cap for 2cd lap with no issues noted .    Ambulation Distance (Feet) 230 Feet   x2 reps,    Assistive device Rollator    Gait Pattern Step-to pattern;Decreased hip/knee flexion - left;Decreased dorsiflexion - left;Left foot flat;Poor foot clearance - left    Ambulation Surface Level;Indoor      Knee/Hip Exercises: Standing   Heel Raises Both;1 set;10 reps;Limitations    Heel Raises Limitations with bil UE support on study surface with cues on posture/weight shifting.     Hip Flexion AROM;Stengthening;Both;1 set;10 reps;Knee bent;Limitations    Hip Flexion Limitations with UE support, alternating marching for 10 reps on each side.               Balance Exercises - 02/06/20 1302      Balance Exercises: Standing   Standing Eyes Opened Wide (BOA);Head turns;Foam/compliant surface;Other reps (comment);Limitations    Standing Eyes Opened Limitations on airex with feet hip width apart for head movements left<>right, up<>down for ~10 reps each with min assist needed with increased postural sway noted.     Standing Eyes Closed Wide (BOA);Foam/compliant surface;3 reps;30 secs;Limitations    Standing Eyes Closed Limitations on airex with feet hip width apart for 3 reps of EC x 30 sec's with posterior balance loss noted each time. cues for weight shifitng to assist with balance control.                PT Short Term Goals - 01/20/20 1531      PT SHORT TERM GOAL #1   Title Pt will perform 5TSS in </=15 secs with single UE support only to indicate improved BLE strength.  (Target Date:02/17/20)    Baseline 17.47 with UE support    Time 4    Period Weeks    Status Revised    Target Date 02/17/20      PT SHORT TERM GOAL #2   Title Pt will improve BERG balance score to >/=33/56 in order to indicate dec fall risk.    Baseline 29/56    Time 4     Period Weeks    Status Revised      PT SHORT TERM GOAL #3   Title Pt will be independent with ongoing HEP in order to indicate improved strength and dec fall risk.    Time 4    Period  Weeks    Status Revised    Target Date 12/17/19      PT SHORT TERM GOAL #4   Title Pt will improve gait speed to >/=2.00 ft/sec with 4 wheeled rollator (and bracing as needed) in order to indicate dec fall risk.    Time 4    Period Weeks    Status New             PT Long Term Goals - 01/20/20 1536      PT LONG TERM GOAL #1   Title Pt will improve BERG balance score to >/=37/56 in order to indicate dec fall risk. (Target date: 03/19/19)    Baseline 01/17/20: 29/56 scored today    Time 8    Period Weeks    Status Revised    Target Date 03/18/20      PT LONG TERM GOAL #2   Title Will continue to assess if foot up brace will be enough support when fatigued to prevent foot drop on LLE and dec fall risk.    Baseline 01/17/20: has had consult. Now working to see if foot up brace meets pt's needs. This is ongoing    Time 8    Period Weeks    Status Revised      PT LONG TERM GOAL #3   Title Patient will be independent with final HEP.     Baseline 01/17/20: reprorts compliance and no issues with current HEP    Time 8    Period Weeks    Status On-going      PT LONG TERM GOAL #4   Title Pt will ambulate 100' with LRAD at mod I level (with AFO) in simulate household environment to indicate safe home mobility.    Baseline 01/17/20: met distance portion at supervision level with foot up brace and rollator    Time 8    Period Weeks    Status On-going      PT LONG TERM GOAL #5   Title Pt will improve 5TSS to </=12 secs with single UE support only to indicate improve BLE strength.    Baseline 01/17/20: 17.47 sec's with UE support from standard height surface    Time 8    Period Weeks    Status Revised      PT LONG TERM GOAL #6   Title Pt will ambulate x 300' over unlevel paved outdoor surfaces  w/ LRAD at S level in order to indicate safety community mobility.    Baseline 01/17/20: unable to assess today due to time constraints    Time 8    Period Weeks    Status On-going      PT LONG TERM GOAL #7   Title Pt will improve gait speed to >/=2.3 ft/sec with LRAD in order to indicate dec fall risk.    Baseline 01/17/20: 1.54 ft/sec with rollator    Time 8    Period Weeks    Status Revised                 Plan - 02/06/20 1239    Clinical Impression Statement Today's skilled session continued to focus on gait with foot up brace/rollator and balance training on compliant surfaces with up to min assist needed for balance on complaint surfaces. The pt is progressing toward goals and should benefit from continued PT to progress toward unmet goals.    PT Frequency 2x / week    PT Duration 8 weeks    PT Treatment/Interventions  ADLs/Self Care Home Management;Aquatic Therapy;Electrical Stimulation;Moist Heat;Balance training;Therapeutic exercise;Therapeutic activities;Functional mobility training;Stair training;Gait training;Neuromuscular re-education;Patient/family education;Manual techniques;Passive range of motion;Dry needling;Taping    PT Next Visit Plan Continue ambulation with clinic brace until patient purchases personal. Complete stair training. Continue seated/standing strengthening.    PT Home Exercise Plan [email or web address]    Consulted and Agree with Plan of Care Patient           Patient will benefit from skilled therapeutic intervention in order to improve the following deficits and impairments:  Abnormal gait, Decreased activity tolerance, Decreased balance, Decreased coordination, Decreased endurance, Decreased range of motion, Decreased strength, Difficulty walking, Increased muscle spasms, Pain, Decreased knowledge of use of DME, Decreased mobility  Visit Diagnosis: Other abnormalities of gait and mobility  Muscle weakness (generalized)  Repeated falls     Problem  List Patient Active Problem List   Diagnosis Date Noted  . Amenorrhea 11/20/2018  . Well woman exam 11/20/2018  . MS (multiple sclerosis) ([place]) 03/14/2018  . CKD (chronic kidney disease), stage III ([place])   . Obesity (BMI 30.0-34.9)   . Anemia 01/09/2018  . Pulmonary embolism ([place]) 09/18/2017  . Elevated serum creatinine 05/12/2017  . Acute encephalopathy 04/20/2017  . Hydronephrosis of left kidney 10/24/2016  . Attention deficit 10/24/2016  . Left leg weakness 08/13/2016  . Chronic pain 08/13/2016  . Mild renal insufficiency 08/13/2016  . Left-sided weakness   . High risk medication use 09/15/2015  . Hip pain, bilateral 07/28/2015  . Urinary hesitancy 07/28/2015  . Multiple sclerosis exacerbation ([place]) 07/17/2015  . Urinary disorder 06/11/2015  . Gait disturbance 05/19/2015  . Numbness 05/19/2015  . Depression with anxiety 05/13/2015  . Other fatigue 05/13/2015  . Left knee pain 05/13/2015  . Avascular necrosis of bones of both hips ([place]) 05/13/2015  . Screening for HIV (human immunodeficiency virus) 07/08/2014  . Essential hypertension, benign 11/19/2013  . Anxiety state, unspecified 11/19/2013  . Screen for STD (sexually transmitted disease) 11/01/2013  . Encounter for routine gynecological examination 11/01/2013  . Oral contraceptive use 10/20/2011  . Multiple sclerosis ([place]) 08/08/2007  . RASH AND OTHER NONSPECIFIC SKIN ERUPTION 06/22/2007  . AVASCULAR NECROSIS, FEMORAL HEAD 03/11/2006  . Essential hypertension 02/03/2006  . 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 DVT, HX OF 02/03/2006    Willow Ora 02/07/2020, 2:56 PM  [facility] [address] [place], [place], [address] Phone: [phone]   Fax:  [phone]  Name: Dawn Peters MRN: [id number] Date of Birth: 1981/02/22

## 2020-02-10 ENCOUNTER — Encounter: Payer: Self-pay | Admitting: Rehabilitation

## 2020-02-10 ENCOUNTER — Other Ambulatory Visit: Payer: Self-pay

## 2020-02-10 ENCOUNTER — Ambulatory Visit: Payer: Medicare Other | Admitting: Rehabilitation

## 2020-02-10 ENCOUNTER — Telehealth: Payer: Self-pay | Admitting: *Deleted

## 2020-02-10 DIAGNOSIS — R296 Repeated falls: Secondary | ICD-10-CM

## 2020-02-10 DIAGNOSIS — R2689 Other abnormalities of gait and mobility: Secondary | ICD-10-CM | POA: Diagnosis not present

## 2020-02-10 DIAGNOSIS — M6281 Muscle weakness (generalized): Secondary | ICD-10-CM

## 2020-02-10 NOTE — Patient Instructions (Signed)
Access Code: [email or web address] URL: [email or web address] Date: 02/10/2020 Prepared by: Cameron Sprang  Exercises Standing Balance in Corner with Eyes Closed - 1 x daily - 5 x weekly - 1 sets - 3 reps - 10 secs hold Standing Balance in Corner - 1 x daily - 5 x weekly - 1 sets - 10 reps Standing Anterior Posterior Weight Shift with Chair - 1 x daily - 5 x weekly - 1 sets - 10 reps

## 2020-02-10 NOTE — Therapy (Signed)
[facility] [address] [place], [place], [address] Phone: [phone]   Fax:  [phone]  Physical Therapy Treatment  Patient Details  Name: Dawn Peters MRN: [id number] Date of Birth: 04/09/1980 Referring Provider (PT): Britt Bottom MD   Encounter Date: 02/10/2020   PT End of Session - 02/10/20 1323    Visit Number 13    Number of Visits 24   per updated POC   Date for PT Re-Evaluation 04/17/20   updated POC, cert for 90 days, POC for 60 days   Authorization Type UHC MCR    Progress Note Due on Visit 20    PT Start Time 2595    PT Stop Time 1400    PT Time Calculation (min) 43 min    Equipment Utilized During Treatment Gait belt    Activity Tolerance Patient tolerated treatment well;No increased pain    Behavior During Therapy WFL for tasks assessed/performed           Past Medical History:  Diagnosis Date  . Anticoagulant long-term use    Eliquis  . Anxiety   . Chronic pain   . CKD (chronic kidney disease), stage III ([place])   . Depression   . DVT, lower extremity, recurrent ([place]) 09/17/2017   left lower extremity-- treated with eliquis  . Gait disturbance    due to MS  . GERD (gastroesophageal reflux disease)    watch diet  . 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 History of DVT of lower extremity 2007  . History of encephalopathy 63/8756   acute metabolic encephalopathy secondary to MS,  resolved  . History of MRSA infection 2004   right hip infection post THA  . History of pulmonary embolus (PE) 2005  . Hydronephrosis, left    w/ acute kidney injury 02/ 2019  . Hypertension   . Left-sided weakness    due to MS  . Migraines   . MS (multiple sclerosis) [facility]) neurologist-  dr Renne Musca   dx 2000--  . Muscle spasticity   . Neurogenic bladder    due to MS  . Pulmonary embolism, bilateral ([place]) 09/17/2017   treated  w/ eliquis  . Strains to urinate   . Urgency of urination     Past Surgical History:  Procedure Laterality Date  . CYSTOSCOPY WITH RETROGRADE PYELOGRAM, URETEROSCOPY AND STENT PLACEMENT Bilateral 11/29/2017   Procedure: BILATERAL  RETROGRADE PYELOGRAM, LEFT DIAGNOSIC URETEROSCOPY AND STENT LEFT PLACEMENT;  Surgeon: Ardis Hughs, MD;  Location: [facility];  Service: Urology;  Laterality: Bilateral;  . HIP ARTHROSCOPY Left 07-05-2013   dr pill $Remov'@[facility]'plHowW$    iliopsosas release, synovectomy  . REVISION TOTAL HIP ARTHROPLASTY Right 03-28-2003    dr Alvan Dame $RemoveBe'@[facility]'BJitDKjVI$   . TOTAL HIP ARTHROPLASTY Bilateral left 11-05-2002  dr Alvan Dame $RemoveBe'@[facility]'DJZtRosjM$ ;   right 06/ 2004  $Rem'@[facility]'qWlB$     There were no vitals filed for this visit.   Subjective Assessment - 02/10/20 1322    Subjective Should get brace tomorrow was ordered over the weekend. Getting walker today.    Pertinent History BIl THR,  ALLERGIC lisinopril, amoxicillin,  MS,    Patient Stated Goals I want to strengthen my LT side and be able to walk on my own.  I can t do anything now.  I want to help my back pain  while I use a walker. It hurts when I  lean over    Currently in Pain? No/denies                             [facility] Adult PT Treatment/Exercise - 02/10/20 1330      Transfers   Transfers Sit to Stand;Stand to Sit    Comments worked on within session without UE support from low mat with arms outstretched for forward weight shift.  Pt did very well x 10 reps.  Min cues for upright posture.        Ambulation/Gait   Ambulation/Gait Yes    Ambulation/Gait Assistance 5: Supervision    Ambulation/Gait Assistance Details Pt utilized clinics rollator during session.  Did not don foot up brace for time and focus of session however she did not catch foot at all today and was more aware of picking up L foot during gait.  Discussed that pt needs to have rollator at next session or we will place on hold as progress is difficult due to her  not being able to practice with rollator at home. Pt verbalized understanding.      Ambulation Distance (Feet) 230 Feet    Assistive device Rollator    Gait Pattern Step-to pattern;Decreased hip/knee flexion - left;Decreased dorsiflexion - left;Left foot flat;Poor foot clearance - left    Ambulation Surface Level;Indoor      Self-Care   Self-Care Other Self-Care Comments    Other Self-Care Comments  Again, discussed that she needs to have rollator at next session or we would likely put on hold.  She did say that her mother purchased her foot up brace and she would have that at next session.  She ambulated into clinic without device today. Continue to educate that she needs AD at ALL times due to high fall risk.  Pt verbalized understanding.        Neuro Re-ed    Neuro Re-ed Details  Corner balance on solid ground with feet apart EO performing head turns side/side x 10 reps, up and down x 10  reps and wide stance EC x 3 reps of 10 secs.  Pt requires max cues for slower head motion (and not full range) and also cues for core activation throughout.  Progressed to standing at counter top with feet staggered holding balance x 2 reps of 20 secs each side then weight shifting (with feet staggered) forward/backwards x 10 reps with tactile and verbal cues for weight shift at hips vs shoulders/head.  Then performed forward/backwards weight shifting with feet apart with rollator for light support, again cues for motion to initiate at hips.  Added some of these to HEP.  Started new program as PT was unable to locate original program.  Ended session with hamstring curls while seated on rolling stool x 15' with tactile facilitation for improved L hamstring activation.            Access Code: [email or web address] URL: [email or web address] Date: 02/10/2020 Prepared by: Cameron Sprang  Exercises Standing Balance in Corner with Eyes Closed - 1 x daily - 5 x weekly - 1 sets - 3 reps - 10 secs hold Standing  Balance in Corner - 1 x daily - 5 x weekly - 1 sets - 10 reps Standing Anterior Posterior Weight Shift with Chair - 1 x daily - 5 x weekly - 1 sets - 10 reps         PT Short Term Goals - 01/20/20 1531  PT SHORT TERM GOAL #1   Title Pt will perform 5TSS in </=15 secs with single UE support only to indicate improved BLE strength.  (Target Date:02/17/20)    Baseline 17.47 with UE support    Time 4    Period Weeks    Status Revised    Target Date 02/17/20      PT SHORT TERM GOAL #2   Title Pt will improve BERG balance score to >/=33/56 in order to indicate dec fall risk.    Baseline 29/56    Time 4    Period Weeks    Status Revised      PT SHORT TERM GOAL #3   Title Pt will be independent with ongoing HEP in order to indicate improved strength and dec fall risk.    Time 4    Period Weeks    Status Revised    Target Date 12/17/19      PT SHORT TERM GOAL #4   Title Pt will improve gait speed to >/=2.00 ft/sec with 4 wheeled rollator (and bracing as needed) in order to indicate dec fall risk.    Time 4    Period Weeks    Status New             PT Long Term Goals - 01/20/20 1536      PT LONG TERM GOAL #1   Title Pt will improve BERG balance score to >/=37/56 in order to indicate dec fall risk. (Target date: 03/19/19)    Baseline 01/17/20: 29/56 scored today    Time 8    Period Weeks    Status Revised    Target Date 03/18/20      PT LONG TERM GOAL #2   Title Will continue to assess if foot up brace will be enough support when fatigued to prevent foot drop on LLE and dec fall risk.    Baseline 01/17/20: has had consult. Now working to see if foot up brace meets pt's needs. This is ongoing    Time 8    Period Weeks    Status Revised      PT LONG TERM GOAL #3   Title Patient will be independent with final HEP.     Baseline 01/17/20: reprorts compliance and no issues with current HEP    Time 8    Period Weeks    Status On-going      PT LONG TERM GOAL #4    Title Pt will ambulate 100' with LRAD at mod I level (with AFO) in simulate household environment to indicate safe home mobility.    Baseline 01/17/20: met distance portion at supervision level with foot up brace and rollator    Time 8    Period Weeks    Status On-going      PT LONG TERM GOAL #5   Title Pt will improve 5TSS to </=12 secs with single UE support only to indicate improve BLE strength.    Baseline 01/17/20: 17.47 sec's with UE support from standard height surface    Time 8    Period Weeks    Status Revised      PT LONG TERM GOAL #6   Title Pt will ambulate x 300' over unlevel paved outdoor surfaces w/ LRAD at S level in order to indicate safety community mobility.    Baseline 01/17/20: unable to assess today due to time constraints    Time 8    Period Weeks    Status On-going  PT LONG TERM GOAL #7   Title Pt will improve gait speed to >/=2.3 ft/sec with LRAD in order to indicate dec fall risk.    Baseline 01/17/20: 1.54 ft/sec with rollator    Time 8    Period Weeks    Status Revised                 Plan - 02/10/20 1544    Clinical Impression Statement Session focused on balance reactions with corner balance as well as controlled weight shifts to allow initiation from hips rather than trunk and head.  Pt did well with clinics rollator today without foot up brace, however continue to recommend for longer distances and fatigue.    PT Frequency 2x / week    PT Duration 8 weeks    PT Treatment/Interventions ADLs/Self Care Home Management;Aquatic Therapy;Electrical Stimulation;Moist Heat;Balance training;Therapeutic exercise;Therapeutic activities;Functional mobility training;Stair training;Gait training;Neuromuscular re-education;Patient/family education;Manual techniques;Passive range of motion;Dry needling;Taping    PT Next Visit Plan If she did not bring her rollator, place on hold!!! She should have her foot up brace. Complete stair training. Continue  seated/standing strengthening.    PT Home Exercise Plan [email or web address]    Consulted and Agree with Plan of Care Patient           Patient will benefit from skilled therapeutic intervention in order to improve the following deficits and impairments:  Abnormal gait, Decreased activity tolerance, Decreased balance, Decreased coordination, Decreased endurance, Decreased range of motion, Decreased strength, Difficulty walking, Increased muscle spasms, Pain, Decreased knowledge of use of DME, Decreased mobility  Visit Diagnosis: Other abnormalities of gait and mobility  Muscle weakness (generalized)  Repeated falls     Problem List Patient Active Problem List   Diagnosis Date Noted  . Amenorrhea 11/20/2018  . Well woman exam 11/20/2018  . MS (multiple sclerosis) ([place]) 03/14/2018  . CKD (chronic kidney disease), stage III ([place])   . Obesity (BMI 30.0-34.9)   . Anemia 01/09/2018  . Pulmonary embolism ([place]) 09/18/2017  . Elevated serum creatinine 05/12/2017  . Acute encephalopathy 04/20/2017  . Hydronephrosis of left kidney 10/24/2016  . Attention deficit 10/24/2016  . Left leg weakness 08/13/2016  . Chronic pain 08/13/2016  . Mild renal insufficiency 08/13/2016  . Left-sided weakness   . High risk medication use 09/15/2015  . Hip pain, bilateral 07/28/2015  . Urinary hesitancy 07/28/2015  . Multiple sclerosis exacerbation ([place]) 07/17/2015  . Urinary disorder 06/11/2015  . Gait disturbance 05/19/2015  . Numbness 05/19/2015  . Depression with anxiety 05/13/2015  . Other fatigue 05/13/2015  . Left knee pain 05/13/2015  . Avascular necrosis of bones of both hips ([place]) 05/13/2015  . Screening for HIV (human immunodeficiency virus) 07/08/2014  . Essential hypertension, benign 11/19/2013  . Anxiety state, unspecified 11/19/2013  . Screen for STD (sexually transmitted disease) 11/01/2013  . Encounter for routine gynecological examination 11/01/2013  . Oral contraceptive use 10/20/2011   . Multiple sclerosis ([place]) 08/08/2007  . RASH AND OTHER NONSPECIFIC SKIN ERUPTION 06/22/2007  . AVASCULAR NECROSIS, FEMORAL HEAD 03/11/2006  . Essential hypertension 02/03/2006  . 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 DVT, HX OF 02/03/2006    Cameron Sprang, PT, MPT [facility] [address] [place], [place], [address] Phone: [phone]   Fax:  [phone] 02/10/20, 3:48 PM  Name: Dawn Peters MRN: [id number] Date of Birth: [birth year]

## 2020-02-10 NOTE — Telephone Encounter (Signed)
Gave completed/signed DMV parking placard application back to medical records to process for pt.

## 2020-02-11 ENCOUNTER — Telehealth: Payer: Self-pay | Admitting: *Deleted

## 2020-02-11 ENCOUNTER — Other Ambulatory Visit: Payer: Self-pay | Admitting: Neurology

## 2020-02-11 NOTE — Telephone Encounter (Signed)
Submitted PA Zeposia on [facility] for plan year 2022. Key: [id number]. Waiting on determination from [employer].

## 2020-02-12 ENCOUNTER — Telehealth: Payer: Self-pay | Admitting: Neurology

## 2020-02-12 ENCOUNTER — Other Ambulatory Visit: Payer: Self-pay | Admitting: *Deleted

## 2020-02-12 ENCOUNTER — Ambulatory Visit: Payer: Medicare Other | Admitting: Physical Therapy

## 2020-02-12 DIAGNOSIS — R269 Unspecified abnormalities of gait and mobility: Secondary | ICD-10-CM

## 2020-02-12 DIAGNOSIS — G35 Multiple sclerosis: Secondary | ICD-10-CM

## 2020-02-12 DIAGNOSIS — R5383 Other fatigue: Secondary | ICD-10-CM

## 2020-02-12 DIAGNOSIS — R29898 Other symptoms and signs involving the musculoskeletal system: Secondary | ICD-10-CM

## 2020-02-12 DIAGNOSIS — R531 Weakness: Secondary | ICD-10-CM

## 2020-02-12 NOTE — Telephone Encounter (Signed)
Faxed signed order to [facility]. Received fax confirmation.

## 2020-02-12 NOTE — Telephone Encounter (Signed)
Request Reference Number: [id number]. [place] CAP .92MG  is approved through 03/06/2021. Your patient may now fill this prescription and it will be covered.

## 2020-02-12 NOTE — Telephone Encounter (Signed)
Pt called, spoke with [facility] to see if they still had my equipment. [facility] said referral has expired, physician would need to fax a new referral. Would like a call from the nurse.

## 2020-02-12 NOTE — Telephone Encounter (Signed)
Called and spoke with pt. She never picked up wheechair, walker and shower chair last year. Needs new orders sent to [facility]. Advised we will take care of this this morning for her. Printed order, waiting on MD signature and then will fax.

## 2020-02-13 ENCOUNTER — Encounter: Payer: Self-pay | Admitting: Physical Therapy

## 2020-02-13 ENCOUNTER — Other Ambulatory Visit: Payer: Self-pay

## 2020-02-13 ENCOUNTER — Ambulatory Visit: Payer: Medicare Other | Admitting: Physical Therapy

## 2020-02-13 DIAGNOSIS — M6281 Muscle weakness (generalized): Secondary | ICD-10-CM

## 2020-02-13 DIAGNOSIS — R2689 Other abnormalities of gait and mobility: Secondary | ICD-10-CM | POA: Diagnosis not present

## 2020-02-13 DIAGNOSIS — R296 Repeated falls: Secondary | ICD-10-CM

## 2020-02-13 DIAGNOSIS — R269 Unspecified abnormalities of gait and mobility: Secondary | ICD-10-CM

## 2020-02-13 NOTE — Therapy (Signed)
[facility] [address] [place], [place], [address] Phone: [phone]   Fax:  [phone]  Physical Therapy Treatment  Patient Details  Name: Dawn Peters MRN: [id number] Date of Birth: 1981-02-13 Referring Provider (PT): Britt Bottom MD   Encounter Date: 02/13/2020   PT End of Session - 02/13/20 1416    Visit Number 14    Number of Visits 24   per updated POC   Date for PT Re-Evaluation 04/17/20   updated POC, cert for 90 days, POC for 60 days   Authorization Type [employer] MCR    Progress Note Due on Visit 20    PT Start Time 1410   pt running late for session   PT Stop Time 1444    PT Time Calculation (min) 34 min    Equipment Utilized During Treatment Gait belt    Activity Tolerance Patient tolerated treatment well;No increased pain    Behavior During Therapy WFL for tasks assessed/performed           Past Medical History:  Diagnosis Date  . Anticoagulant long-term use    Eliquis  . Anxiety   . Chronic pain   . CKD (chronic kidney disease), stage III ([place])   . Depression   . DVT, lower extremity, recurrent ([place]) 09/17/2017   left lower extremity-- treated with eliquis  . Gait disturbance    due to MS  . GERD (gastroesophageal reflux disease)    watch diet  . 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 History of DVT of lower extremity 2007  . History of encephalopathy 95/6387   acute metabolic encephalopathy secondary to MS,  resolved  . History of MRSA infection 2004   right hip infection post THA  . History of pulmonary embolus (PE) 2005  . Hydronephrosis, left    w/ acute kidney injury 02/ 2019  . Hypertension   . Left-sided weakness    due to MS  . Migraines   . MS (multiple sclerosis) [facility]) neurologist-  dr Renne Musca   dx 2000--  . Muscle spasticity   . Neurogenic bladder    due to MS  . Pulmonary embolism, bilateral  ([place]) 09/17/2017   treated w/ eliquis  . Strains to urinate   . Urgency of urination     Past Surgical History:  Procedure Laterality Date  . CYSTOSCOPY WITH RETROGRADE PYELOGRAM, URETEROSCOPY AND STENT PLACEMENT Bilateral 11/29/2017   Procedure: BILATERAL  RETROGRADE PYELOGRAM, LEFT DIAGNOSIC URETEROSCOPY AND STENT LEFT PLACEMENT;  Surgeon: Ardis Hughs, MD;  Location: [facility];  Service: Urology;  Laterality: Bilateral;  . HIP ARTHROSCOPY Left 07-05-2013   dr pill $Remov'@[facility]'jdretF$    iliopsosas release, synovectomy  . REVISION TOTAL HIP ARTHROPLASTY Right 03-28-2003    dr Alvan Dame $RemoveBe'@[facility]'cnHrIGGbl$   . TOTAL HIP ARTHROPLASTY Bilateral left 11-05-2002  dr Alvan Dame $RemoveBe'@[facility]'uULYLrVVM$ ;   right 06/ 2004  $Rem'@[facility]'bybq$     There were no vitals filed for this visit.   Subjective Assessment - 02/13/20 1413    Subjective No new complaints. No falls or pain to report. Has her foot up brace. Her rollator, tub bench and new wheelchair have been ordered from [employer] and should be ready tomorow. Just waiting for insurance authorization.    Pertinent History BIl THR,  ALLERGIC lisinopril, amoxicillin,  MS,    Limitations Standing;Walking;House hold activities    How long can you sit comfortably?  unlimited    How long can you stand comfortably? < 10 min    How long can you walk comfortably? < 5 minutes    Patient Stated Goals I want to strengthen my LT side and be able to walk on my own.  I can t do anything now.  I want to help my back pain  while I use a walker. It hurts when I lean over    Currently in Pain? No/denies    Pain Score 0-No pain                [facility] Adult PT Treatment/Exercise - 02/13/20 1417      Transfers   Transfers Sit to Stand;Stand to Sit    Sit to Stand 5: Supervision;With upper extremity assist;From chair/3-in-1;From bed    Stand to Sit 5: Supervision;With upper extremity assist;To chair/3-in-1;To bed      Ambulation/Gait   Ambulation/Gait Yes    Ambulation/Gait Assistance 5: Supervision     Ambulation/Gait Assistance Details with personal foot up brace on left foot and clinic rollator. reminder cues on posture and rollator position with gait.    Ambulation Distance (Feet) 230 Feet   x2   Assistive device Rollator    Gait Pattern Step-to pattern;Decreased hip/knee flexion - left;Decreased dorsiflexion - left;Left foot flat;Poor foot clearance - left    Ambulation Surface Level;Indoor    Gait velocity 13.41 sec's= 2.45 ft/sec with rollator with left foot up brace      Self-Care   Self-Care Other Self-Care Comments    Other Self-Care Comments  donned pt's foot up brace to left shoe with increase time and effort due to style she got is difficult to get buckle under end of laces. (mom purchased her the one that can be worn barefoot around the forefoot or hooked onto laces. Pt plans to look into getting the other one to use with shoes due to the amount of effort it took to get this one attached and tightened up.      Exercises   Exercises Other Exercises    Other Exercises  pt reports ex's are going well at home with no issues. Reports doing them at least 4-5 days a week.               PT Short Term Goals - 02/13/20 1431      PT SHORT TERM GOAL #1   Title Pt will perform 5TSS in </=15 secs with single UE support only to indicate improved BLE strength.  (Target Date:02/17/20)    Baseline 17.47 with UE support    Time 4    Period Weeks    Status On-going    Target Date 02/17/20      PT SHORT TERM GOAL #2   Title Pt will improve BERG balance score to >/=33/56 in order to indicate dec fall risk.    Baseline 29/56    Time 4    Period Weeks    Status On-going      PT SHORT TERM GOAL #3   Title Pt will be independent with ongoing HEP in order to indicate improved strength and dec fall risk.    Baseline 02/13/20: met with current program    Status Achieved    Target Date 12/17/19      PT SHORT TERM GOAL #4   Title Pt will improve gait speed to >/=2.00 ft/sec with 4  wheeled rollator (and bracing as needed) in order to indicate dec fall risk.  Baseline 02/13/20: 2.45 ft/sec with rollator and foot up brace donned    Time --    Period --    Status Achieved             PT Long Term Goals - 01/20/20 1536      PT LONG TERM GOAL #1   Title Pt will improve BERG balance score to >/=37/56 in order to indicate dec fall risk. (Target date: 03/19/19)    Baseline 01/17/20: 29/56 scored today    Time 8    Period Weeks    Status Revised    Target Date 03/18/20      PT LONG TERM GOAL #2   Title Will continue to assess if foot up brace will be enough support when fatigued to prevent foot drop on LLE and dec fall risk.    Baseline 01/17/20: has had consult. Now working to see if foot up brace meets pt's needs. This is ongoing    Time 8    Period Weeks    Status Revised      PT LONG TERM GOAL #3   Title Patient will be independent with final HEP.     Baseline 01/17/20: reprorts compliance and no issues with current HEP    Time 8    Period Weeks    Status On-going      PT LONG TERM GOAL #4   Title Pt will ambulate 100' with LRAD at mod I level (with AFO) in simulate household environment to indicate safe home mobility.    Baseline 01/17/20: met distance portion at supervision level with foot up brace and rollator    Time 8    Period Weeks    Status On-going      PT LONG TERM GOAL #5   Title Pt will improve 5TSS to </=12 secs with single UE support only to indicate improve BLE strength.    Baseline 01/17/20: 17.47 sec's with UE support from standard height surface    Time 8    Period Weeks    Status Revised      PT LONG TERM GOAL #6   Title Pt will ambulate x 300' over unlevel paved outdoor surfaces w/ LRAD at S level in order to indicate safety community mobility.    Baseline 01/17/20: unable to assess today due to time constraints    Time 8    Period Weeks    Status On-going      PT LONG TERM GOAL #7   Title Pt will improve gait speed to  >/=2.3 ft/sec with LRAD in order to indicate dec fall risk.    Baseline 01/17/20: 1.54 ft/sec with rollator    Time 8    Period Weeks    Status Revised                 Plan - 02/13/20 1416    Clinical Impression Statement Today's skilled session focused on donning pts new foot up brace and gait with clinic rollator (her's has been ordered, hoping to get it tomorrow). Also began to address STGs with goals 3 and 4 met. Pt is independent with current HEP and increased her 10 meter gait speed to 2.45 ft/sec with rollator/foot up brace. The pt is progressing toward goals and should benefit from continued PT to progress toward unmet goals.    PT Frequency 2x / week    PT Duration 8 weeks    PT Treatment/Interventions ADLs/Self Care Home Management;Aquatic Therapy;Electrical Stimulation;Moist Heat;Balance training;Therapeutic exercise;Therapeutic activities;Functional  mobility training;Stair training;Gait training;Neuromuscular re-education;Patient/family education;Manual techniques;Passive range of motion;Dry needling;Taping    PT Next Visit Plan Check remaining STGs- 5 time sit to stand and Berg Balance test. Complete stair training. Continue seated/standing strengthening.    PT Home Exercise Plan [id number]    Consulted and Agree with Plan of Care Patient           Patient will benefit from skilled therapeutic intervention in order to improve the following deficits and impairments:  Abnormal gait,Decreased activity tolerance,Decreased balance,Decreased coordination,Decreased endurance,Decreased range of motion,Decreased strength,Difficulty walking,Increased muscle spasms,Pain,Decreased knowledge of use of DME,Decreased mobility  Visit Diagnosis: Other abnormalities of gait and mobility  Muscle weakness (generalized)  Repeated falls  Gait disturbance     Problem List Patient Active Problem List   Diagnosis Date Noted  . Amenorrhea 11/20/2018  . Well woman exam 11/20/2018  .  MS (multiple sclerosis) ([place]) 03/14/2018  . CKD (chronic kidney disease), stage III ([place])   . Obesity (BMI 30.0-34.9)   . Anemia 01/09/2018  . Pulmonary embolism ([place]) 09/18/2017  . Elevated serum creatinine 05/12/2017  . Acute encephalopathy 04/20/2017  . Hydronephrosis of left kidney 10/24/2016  . Attention deficit 10/24/2016  . Left leg weakness 08/13/2016  . Chronic pain 08/13/2016  . Mild renal insufficiency 08/13/2016  . Left-sided weakness   . High risk medication use 09/15/2015  . Hip pain, bilateral 07/28/2015  . Urinary hesitancy 07/28/2015  . Multiple sclerosis exacerbation ([place]) 07/17/2015  . Urinary disorder 06/11/2015  . Gait disturbance 05/19/2015  . Numbness 05/19/2015  . Depression with anxiety 05/13/2015  . Other fatigue 05/13/2015  . Left knee pain 05/13/2015  . Avascular necrosis of bones of both hips ([place]) 05/13/2015  . Screening for HIV (human immunodeficiency virus) 07/08/2014  . Essential hypertension, benign 11/19/2013  . Anxiety state, unspecified 11/19/2013  . Screen for STD (sexually transmitted disease) 11/01/2013  . Encounter for routine gynecological examination 11/01/2013  . Oral contraceptive use 10/20/2011  . Multiple sclerosis ([place]) 08/08/2007  . RASH AND OTHER NONSPECIFIC SKIN ERUPTION 06/22/2007  . AVASCULAR NECROSIS, FEMORAL HEAD 03/11/2006  . Essential hypertension 02/03/2006  . 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 DVT, HX OF 02/03/2006    Willow Ora, PTA, [facility] [address] [place],  [address] [phone] 02/13/20, 4:29 PM   Name: Dawn Peters MRN: [id number] Date of Birth: 1980/05/14

## 2020-02-17 ENCOUNTER — Ambulatory Visit: Payer: Medicare Other | Admitting: Rehabilitation

## 2020-02-19 ENCOUNTER — Other Ambulatory Visit: Payer: Self-pay | Admitting: *Deleted

## 2020-02-19 ENCOUNTER — Telehealth: Payer: Self-pay | Admitting: Neurology

## 2020-02-19 DIAGNOSIS — R531 Weakness: Secondary | ICD-10-CM

## 2020-02-19 DIAGNOSIS — R269 Unspecified abnormalities of gait and mobility: Secondary | ICD-10-CM

## 2020-02-19 DIAGNOSIS — G35 Multiple sclerosis: Secondary | ICD-10-CM

## 2020-02-19 DIAGNOSIS — R29898 Other symptoms and signs involving the musculoskeletal system: Secondary | ICD-10-CM

## 2020-02-19 NOTE — Telephone Encounter (Signed)
Pt states Emma,RN ordered the wrong walker for her.  Please call pt

## 2020-02-19 NOTE — Telephone Encounter (Signed)
Called pt back. She states PT recommending walker with seat. She received a regular walker from [employer]. She already picked it up. She is going to bring this back. Advised I will send new order for walker with seat to [employer] for her. She verbalized understanding and appreciation.

## 2020-02-19 NOTE — Telephone Encounter (Signed)
Faxed completed/signed rx, received fax confirmation.

## 2020-02-20 ENCOUNTER — Ambulatory Visit: Payer: Medicare Other | Admitting: Physical Therapy

## 2020-02-24 NOTE — Telephone Encounter (Signed)
Called [employer] and spoke with Felix Pacini at [phone].They did not receive order for WC w/ seat. We can re-fax to [phone]. Received fax confirmation.

## 2020-02-24 NOTE — Telephone Encounter (Signed)
Pt called wanting to speak to the RN regarding her walker. She states that they informed her that she can not go back to PT until she has the correct walker. Please advise.

## 2020-02-24 NOTE — Telephone Encounter (Signed)
Called pt to let her know order was not received by [employer] last week for [facility] w/ seat. I refaxed and received confirmation. She will follow up with [employer] again and will call back if she has any more issues.

## 2020-02-25 ENCOUNTER — Telehealth: Payer: Self-pay | Admitting: Physical Therapy

## 2020-02-25 ENCOUNTER — Ambulatory Visit: Payer: Medicare Other | Admitting: Physical Therapy

## 2020-02-25 NOTE — Telephone Encounter (Signed)
Dr. Felecia Shelling,  [name]  is being treated by physical therapy for weakness and gait issues due to MS. She has been trying to obtain a rollator walker from [employer] for some time now. Last week per chart review it appears your office faxed over an order for the walker with a seat. On talking with Freida Busman today she stated they gave her a standard RW with 5 inch wheels. She says they stated they did not get the order for the rollator with 4 wheels and a seat. Would your office be able to resend this order to [facility] in [facility] so that Dawn can get the appropriate walker to maximize her safety with walking?   Thank you,   Willow Ora, PTA, [facility]hab Services [phone] 02/25/20, 9:52 AM   [facility] [address] [place],   [address] Phone:  [phone] Fax:  [phone]

## 2020-02-25 NOTE — Telephone Encounter (Signed)
I have faxed the order again, [phone], and received a fax confirmation.

## 2020-02-27 ENCOUNTER — Ambulatory Visit: Payer: Medicare Other | Admitting: Physical Therapy

## 2020-03-02 ENCOUNTER — Ambulatory Visit: Payer: Medicare Other | Admitting: Rehabilitation

## 2020-03-04 ENCOUNTER — Ambulatory Visit: Payer: Medicare Other | Admitting: Physical Therapy

## 2020-03-04 ENCOUNTER — Other Ambulatory Visit: Payer: Self-pay | Admitting: Neurology

## 2020-03-09 ENCOUNTER — Telehealth: Payer: Self-pay | Admitting: Neurology

## 2020-03-09 ENCOUNTER — Other Ambulatory Visit: Payer: Self-pay | Admitting: *Deleted

## 2020-03-09 DIAGNOSIS — G35 Multiple sclerosis: Secondary | ICD-10-CM

## 2020-03-09 MED ORDER — ZEPOSIA 0.92 MG PO CAPS: ORAL_CAPSULE | ORAL | 11 refills | Status: DC

## 2020-03-09 NOTE — Telephone Encounter (Signed)
Pt states she has been told by her specialty pharmacy that there is something Dr Epimenio Foot did not send back to them re: her Ozanimod HCl (ZEPOSIA) 0.92 MG CAPS.  Pt is being told to go and pick up her medication which is not even at a pharmacy in [place].  Please call.

## 2020-03-09 NOTE — Telephone Encounter (Signed)
Called pt back. Advised new rx sent today for her Hassie Bruce [facility]. She will follow back up with them. Nothing further needed.

## 2020-03-11 ENCOUNTER — Ambulatory Visit: Payer: Medicare Other | Admitting: Physical Therapy

## 2020-03-12 ENCOUNTER — Ambulatory Visit: Payer: Medicare Other | Admitting: Physical Therapy

## 2020-03-16 ENCOUNTER — Encounter: Payer: Self-pay | Admitting: Rehabilitation

## 2020-03-16 ENCOUNTER — Ambulatory Visit: Payer: Medicare Other | Attending: Neurology | Admitting: Rehabilitation

## 2020-03-16 ENCOUNTER — Other Ambulatory Visit: Payer: Self-pay

## 2020-03-16 DIAGNOSIS — R2689 Other abnormalities of gait and mobility: Secondary | ICD-10-CM | POA: Diagnosis present

## 2020-03-16 DIAGNOSIS — R296 Repeated falls: Secondary | ICD-10-CM | POA: Diagnosis present

## 2020-03-16 DIAGNOSIS — M6281 Muscle weakness (generalized): Secondary | ICD-10-CM | POA: Diagnosis present

## 2020-03-16 NOTE — Therapy (Signed)
[facility] [address] [place], [place], [address] Phone: [phone]   Fax:  [phone]  Physical Therapy Treatment and D/C Summary  Patient Details  Name: Dawn Peters MRN: [id number] Date of Birth: 04/11/1980 Referring Provider (PT): Britt Bottom MD   Encounter Date: 03/16/2020   PT End of Session - 03/16/20 1525    Visit Number 15    Number of Visits 24   per updated POC   Date for PT Re-Evaluation 04/17/20   updated POC, cert for 90 days, POC for 60 days   Authorization Type [employer] MCR    Progress Note Due on Visit 20    PT Start Time 1329   pt late to session   PT Stop Time 1400    PT Time Calculation (min) 31 min    Equipment Utilized During Treatment Gait belt    Activity Tolerance Patient tolerated treatment well;No increased pain    Behavior During Therapy WFL for tasks assessed/performed           Past Medical History:  Diagnosis Date  . Anticoagulant long-term use    Eliquis  . Anxiety   . Chronic pain   . CKD (chronic kidney disease), stage III ([place])   . Depression   . DVT, lower extremity, recurrent ([place]) 09/17/2017   left lower extremity-- treated with eliquis  . Gait disturbance    due to MS  . GERD (gastroesophageal reflux disease)    watch diet  . 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 History of DVT of lower extremity 2007  . History of encephalopathy 99/8338   acute metabolic encephalopathy secondary to MS,  resolved  . History of MRSA infection 2004   right hip infection post THA  . History of pulmonary embolus (PE) 2005  . Hydronephrosis, left    w/ acute kidney injury 02/ 2019  . Hypertension   . Left-sided weakness    due to MS  . Migraines   . MS (multiple sclerosis) [facility]) neurologist-  dr Renne Musca   dx 2000--  . Muscle spasticity   . Neurogenic bladder    due to MS  . Pulmonary embolism,  bilateral ([place]) 09/17/2017   treated w/ eliquis  . Strains to urinate   . Urgency of urination     Past Surgical History:  Procedure Laterality Date  . CYSTOSCOPY WITH RETROGRADE PYELOGRAM, URETEROSCOPY AND STENT PLACEMENT Bilateral 11/29/2017   Procedure: BILATERAL  RETROGRADE PYELOGRAM, LEFT DIAGNOSIC URETEROSCOPY AND STENT LEFT PLACEMENT;  Surgeon: Ardis Hughs, MD;  Location: [facility];  Service: Urology;  Laterality: Bilateral;  . HIP ARTHROSCOPY Left 07-05-2013   dr pill _0    iliopsosas release, synovectomy  . REVISION TOTAL HIP ARTHROPLASTY Right 03-28-2003    dr Alvan Dame _1   . TOTAL HIP ARTHROPLASTY Bilateral left 11-05-2002  dr Alvan Dame _2 ;   right 06/ 2004  _3     There were no vitals filed for this visit.   Subjective Assessment - 03/16/20 1520    Subjective Pt reports increased back pain today.  Does have her rollator and foot up brace.    Pertinent History BIl THR,  ALLERGIC lisinopril, amoxicillin,  MS,    Limitations Standing;Walking;House hold activities    Patient Stated Goals I want to strengthen my LT side and be able to walk on my own.  I can t do anything now.  I want to help my back pain  while I use a walker. It hurts when I lean over    Currently in Pain? Yes    Pain Score 8     Pain Location Back    Pain Orientation Lower    Pain Descriptors / Indicators Aching    Pain Type Chronic pain    Pain Onset More than a month ago    Pain Frequency Constant    Aggravating Factors  walking    Pain Relieving Factors resting, sitting                             [facility] Adult PT Treatment/Exercise - 03/16/20 1330      Transfers   Five time sit to stand comments  29.30 secs without UE support, 17.20 secs with single UE support      Ambulation/Gait   Ambulation/Gait Yes    Ambulation/Gait Assistance 5: Supervision    Ambulation/Gait Assistance Details Pt needing increased time and several standing rest breaks during gait  due to pain in low back.  Cues for posture and activating core during gait.  Also PT donned her foot up brace which is slightly different than clinic but similar in that it clips to shoe laces.  Educated on how to don properly on third set of laces down.  Pt verbalized understanding.  Noted some improvement but still with mild intermittent L foot drag.    Ambulation Distance (Feet) 115 Feet   then another 100'   Assistive device Rollator    Gait Pattern Step-to pattern;Decreased hip/knee flexion - left;Decreased dorsiflexion - left;Left foot flat;Poor foot clearance - left    Ambulation Surface Level;Indoor    Gait velocity 1.27 ft/sec with rollator and foot up brace      Self-Care   Self-Care Other Self-Care Comments    Other Self-Care Comments  Lengthy discussion on taking break from therapy at this time so that she may work on HEP and gait at home with new rollator and foot up brace.  Did recommend that if she is still having high back pain to get new referral for low back pain and get to [facility] rehab for this issue.  Do feel that she will be someone to return to this clinic in the future given the nature of MS.  Discussed that she will need to be more compliant to see better progress/carryover in the future.                    PT Short Term Goals - 02/13/20 1431      PT SHORT TERM GOAL #1   Title Pt will perform 5TSS in </=15 secs with single UE support only to indicate improved BLE strength.  (Target Date:02/17/20)    Baseline 17.47 with UE support    Time 4    Period Weeks    Status On-going    Target Date 02/17/20      PT SHORT TERM GOAL #2   Title Pt will improve BERG balance score to >/=33/56 in order to indicate dec fall risk.    Baseline 29/56    Time 4    Period Weeks    Status On-going      PT SHORT TERM GOAL #3   Title Pt will be independent with ongoing HEP in order to indicate improved strength and dec fall risk.    Baseline 02/13/20: met with current  program    Status Achieved    Target Date 12/17/19      PT SHORT TERM GOAL #4   Title Pt will improve gait speed to >/=2.00 ft/sec with 4 wheeled rollator (and bracing as needed) in order to indicate dec fall risk.    Baseline 02/13/20: 2.45 ft/sec with rollator and foot up brace donned    Time --    Period --    Status Achieved             PT Long Term Goals - 03/16/20 1337      PT LONG TERM GOAL #1   Title Pt will improve BERG balance score to >/=37/56 in order to indicate dec fall risk. (Target date: 03/19/19)    Baseline 29/56 on 01/17/20, did not have time to reassess due to being late to session    Time 8    Period Weeks    Status Unable to assess      PT LONG TERM GOAL #2   Title Will continue to assess if foot up brace will be enough support when fatigued to prevent foot drop on LLE and dec fall risk.    Baseline 03/16/20: now has foot up brace    Time 8    Period Weeks    Status Achieved      PT LONG TERM GOAL #3   Title Patient will be independent with final HEP.     Baseline 01/17/20: reprorts compliance and no issues with current HEP    Time 8    Period Weeks    Status Achieved      PT LONG TERM GOAL #4   Title Pt will ambulate 100' with LRAD at mod I level (with AFO) in simulate household environment to indicate safe home mobility.    Baseline Met with rollator and use of foot up brace 03/16/20    Time 8    Period Weeks    Status Achieved      PT LONG TERM GOAL #5   Title Pt will improve 5TSS to </=12 secs with single UE support only to indicate improve BLE strength.    Baseline 29.30 secs without UE support, 17.20 secs with single UE support only    Time 8    Period Weeks    Status Partially Met      PT LONG TERM GOAL #6   Title Pt will ambulate x 300' over unlevel paved outdoor surfaces w/ LRAD at S level in order to indicate safety community mobility.    Baseline did not have time to assess due to time contraint    Time 8    Period Weeks    Status  Unable to assess      PT LONG TERM GOAL #7   Title Pt will improve gait speed to >/=2.3 ft/sec with LRAD in order to indicate dec fall risk.    Baseline 1.27 ft/sec with rollator and foot up brace    Time 8    Period Weeks    Status Not Met            PHYSICAL THERAPY DISCHARGE SUMMARY  Visits from Start of Care: 15  Current functional level related to goals / functional outcomes: See LTGs above   Remaining deficits: Pt continues to have balance, strength, and endurance deficits.  She is has been limited due to her inconsistent attendance and being consistently late to most other sessions along with delay in getting her rollator.  Education / Equipment: HEP   Plan: Patient agrees to discharge.  Patient goals were partially met. Patient is being discharged due to lack of progress.  ?????           Plan - 03/16/20 1526    Clinical Impression Statement Skilled session focused on assessment of LTGs.  Did not get to assess outdoor gait nor BERG balance goal due to pt being late to session and needing to educate on donning of foot up brace.  She partially met goal for 5TSS, met goal for HEP and foot up brace, however did not meet gait speed goal with a decline.  Plan to D/C at this time and have pt return to [facility] for low back pain and return here in a few months when she can be more consistent.    Personal Factors and Comorbidities Comorbidity 1;Comorbidity 2    Comorbidities bil THR  and one revision , MS exacerbation.  Low back pain due to immobility  See Medical flow chart    Examination-Activity Limitations Bed Mobility;Locomotion Level;Squat;Stairs;Stand;Transfers    PT Frequency 2x / week    PT Duration 8 weeks    PT Treatment/Interventions ADLs/Self Care Home Management;Aquatic Therapy;Electrical Stimulation;Moist Heat;Balance training;Therapeutic exercise;Therapeutic activities;Functional mobility training;Stair training;Gait training;Neuromuscular  re-education;Patient/family education;Manual techniques;Passive range of motion;Dry needling;Taping    PT Home Exercise Plan [id number]    Consulted and Agree with Plan of Care Patient           Patient will benefit from skilled therapeutic intervention in order to improve the following deficits and impairments:  Abnormal gait,Decreased activity tolerance,Decreased balance,Decreased coordination,Decreased endurance,Decreased range of motion,Decreased strength,Difficulty walking,Increased muscle spasms,Pain,Decreased knowledge of use of DME,Decreased mobility  Visit Diagnosis: Other abnormalities of gait and mobility  Muscle weakness (generalized)  Repeated falls     Problem List Patient Active Problem List   Diagnosis Date Noted  . Amenorrhea 11/20/2018  . Well woman exam 11/20/2018  . MS (multiple sclerosis) ([place]) 03/14/2018  . CKD (chronic kidney disease), stage III ([place])   . Obesity (BMI 30.0-34.9)   . Anemia 01/09/2018  . Pulmonary embolism ([place]) 09/18/2017  . Elevated serum creatinine 05/12/2017  . Acute encephalopathy 04/20/2017  . Hydronephrosis of left kidney 10/24/2016  . Attention deficit 10/24/2016  . Left leg weakness 08/13/2016  . Chronic pain 08/13/2016  . Mild renal insufficiency 08/13/2016  . Left-sided weakness   . High risk medication use 09/15/2015  . Hip pain, bilateral 07/28/2015  . Urinary hesitancy 07/28/2015  . Multiple sclerosis exacerbation ([place]) 07/17/2015  . Urinary disorder 06/11/2015  . Gait disturbance 05/19/2015  . Numbness 05/19/2015  . Depression with anxiety 05/13/2015  . Other fatigue 05/13/2015  . Left knee pain 05/13/2015  . Avascular necrosis of bones of both hips ([place]) 05/13/2015  . Screening for HIV (human immunodeficiency virus) 07/08/2014  . Essential hypertension, benign 11/19/2013  . Anxiety state, unspecified 11/19/2013  . Screen for STD (sexually transmitted disease) 11/01/2013  . Encounter for routine gynecological  examination 11/01/2013  . Oral contraceptive use 10/20/2011  . Multiple sclerosis ([place]) 08/08/2007  . RASH AND OTHER NONSPECIFIC SKIN ERUPTION 06/22/2007  . AVASCULAR NECROSIS, FEMORAL HEAD 03/11/2006  . Essential hypertension 02/03/2006  . 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 DVT, HX OF 02/03/2006    Cameron Sprang, PT, MPT [facility] [address] [place], [place], [address] Phone: [phone]   Fax:  [phone] 03/16/20, 3:31 PM  Name: Dawn Peters MRN: [id number] Date of Birth: 12/29/1980

## 2020-03-19 ENCOUNTER — Ambulatory Visit: Payer: Medicare Other

## 2020-03-19 ENCOUNTER — Encounter: Payer: Self-pay | Admitting: *Deleted

## 2020-03-30 ENCOUNTER — Telehealth: Payer: Self-pay | Admitting: Neurology

## 2020-03-30 NOTE — Telephone Encounter (Signed)
Pt needs r/s due to Dr. Felecia Shelling out 2/23. I called but she doesn't have a vm. Sent [employer].

## 2020-04-01 ENCOUNTER — Other Ambulatory Visit: Payer: Self-pay | Admitting: Neurology

## 2020-04-01 DIAGNOSIS — R269 Unspecified abnormalities of gait and mobility: Secondary | ICD-10-CM

## 2020-04-01 DIAGNOSIS — G35 Multiple sclerosis: Secondary | ICD-10-CM

## 2020-04-01 DIAGNOSIS — R531 Weakness: Secondary | ICD-10-CM

## 2020-04-02 NOTE — Telephone Encounter (Signed)
Called pt. She would like order for shower chair to go to [facility]. I faxed order to them at [phone]. Received fax confirmation.  Also reminded her that she is past due for [employer] (last set 02/05/20). She will call [facility] to get scheduled after she gets off the phone with me.

## 2020-04-28 ENCOUNTER — Other Ambulatory Visit: Payer: Self-pay | Admitting: Neurology

## 2020-04-29 ENCOUNTER — Other Ambulatory Visit: Payer: Self-pay

## 2020-04-29 ENCOUNTER — Ambulatory Visit: Payer: Medicare Other | Admitting: Neurology

## 2020-04-29 ENCOUNTER — Ambulatory Visit
Admission: RE | Admit: 2020-04-29 | Discharge: 2020-04-29 | Disposition: A | Discharge status: Home / Self Care | Payer: Medicare Other | Source: Ambulatory Visit | Attending: Family Medicine | Admitting: Family Medicine

## 2020-04-29 DIAGNOSIS — Z1231 Encounter for screening mammogram for malignant neoplasm of breast: Secondary | ICD-10-CM

## 2020-05-14 ENCOUNTER — Other Ambulatory Visit: Payer: Self-pay | Admitting: Neurology

## 2020-05-14 MED ORDER — AMPHETAMINE-DEXTROAMPHET ER 20 MG PO CP24: 20.0000 mg | ORAL_CAPSULE | Freq: Every day | ORAL | 0 refills | Status: DC

## 2020-05-14 NOTE — Telephone Encounter (Signed)
Pt called needing a refill on her amphetamine-dextroamphetamine (ADDERALL XR) 20 MG 24 hr capsule sent in to the [facility] on [address]

## 2020-05-14 NOTE — Telephone Encounter (Signed)
Called pt back to let her know I got message.  Also reminded her that she needs to complete [place] monthly labs, last set 02/05/20. She is past due. She states [facility] came out the other day to do labs but she was not home. She will call back to get lab drawn set up.

## 2020-05-27 ENCOUNTER — Telehealth: Payer: Self-pay | Admitting: *Deleted

## 2020-05-27 NOTE — Telephone Encounter (Signed)
LVM for pt reminding her that she needs [place] labs. Past due. Asked her to call office back.  Call from 05/14/20 w/ pt: "Reminded her that she needs to complete [place] monthly labs, last set 02/05/20. She is past due. She states [facility] came out the other day to do labs but she was not home. She will call back to get lab drawn set up."

## 2020-05-28 ENCOUNTER — Encounter: Payer: Self-pay | Admitting: *Deleted

## 2020-05-28 NOTE — Telephone Encounter (Signed)
LVM for pt to call office back.

## 2020-05-28 NOTE — Telephone Encounter (Signed)
Sent pt letter since unreachable by phone.

## 2020-06-04 ENCOUNTER — Telehealth: Payer: Self-pay | Admitting: *Deleted

## 2020-06-04 NOTE — Telephone Encounter (Signed)
Received fax from [place] that [place] is approved 05/05/20-03/06/21 as non-formulary. Member ID: [id number]

## 2020-06-04 NOTE — Telephone Encounter (Signed)
Submitted urgent PA Zeposia on CMM. Key: [id number]. Waiting on determination from [facility]

## 2020-06-10 ENCOUNTER — Telehealth: Payer: Self-pay | Admitting: Neurology

## 2020-06-10 NOTE — Telephone Encounter (Signed)
Pt called wanting to know if her lab results have come in. Please advise.

## 2020-06-10 NOTE — Telephone Encounter (Addendum)
Per Dr. Felecia Shelling, low lymphs d/t MS med she is one. Kidney function similar to previous values. I called and LVM for pt about results.

## 2020-06-11 NOTE — Telephone Encounter (Signed)
error 

## 2020-06-18 ENCOUNTER — Encounter: Payer: Self-pay | Admitting: Neurology

## 2020-06-22 ENCOUNTER — Ambulatory Visit: Payer: Medicare Other | Admitting: Neurology

## 2020-06-25 ENCOUNTER — Ambulatory Visit: Payer: Medicare Other | Admitting: Neurology

## 2020-07-08 ENCOUNTER — Other Ambulatory Visit: Payer: Self-pay | Admitting: Neurology

## 2020-07-09 ENCOUNTER — Other Ambulatory Visit: Payer: Self-pay | Admitting: Neurology

## 2020-07-14 ENCOUNTER — Telehealth: Payer: Self-pay | Admitting: Neurology

## 2020-07-14 MED ORDER — APIXABAN 5 MG PO TABS: 5.0000 mg | ORAL_TABLET | Freq: Two times a day (BID) | ORAL | 3 refills | Status: DC

## 2020-07-14 NOTE — Telephone Encounter (Signed)
Request Eliquis refill

## 2020-07-16 ENCOUNTER — Ambulatory Visit: Payer: Medicare Other | Admitting: Neurology

## 2020-07-16 ENCOUNTER — Encounter: Payer: Self-pay | Admitting: Neurology

## 2020-07-21 ENCOUNTER — Encounter: Payer: Self-pay | Admitting: Neurology

## 2020-07-21 ENCOUNTER — Ambulatory Visit: Payer: Self-pay | Admitting: Neurology

## 2020-07-28 ENCOUNTER — Encounter: Payer: Self-pay | Admitting: *Deleted

## 2020-08-01 ENCOUNTER — Other Ambulatory Visit: Payer: Self-pay | Admitting: Neurology

## 2020-08-05 ENCOUNTER — Other Ambulatory Visit: Payer: Self-pay | Admitting: Neurology

## 2020-08-05 NOTE — Telephone Encounter (Signed)
Pt request refill amphetamine-dextroamphetamine (ADDERALL XR) 20 MG 24 hr capsule at [facility] #[id number]

## 2020-08-06 MED ORDER — AMPHETAMINE-DEXTROAMPHET ER 20 MG PO CP24: 20.0000 mg | ORAL_CAPSULE | Freq: Every day | ORAL | 0 refills | Status: DC

## 2020-08-08 ENCOUNTER — Other Ambulatory Visit: Payer: Self-pay | Admitting: Neurology

## 2020-08-11 ENCOUNTER — Encounter: Payer: Self-pay | Admitting: *Deleted

## 2020-08-12 MED ORDER — AMPHETAMINE-DEXTROAMPHET ER 20 MG PO CP24: 20.0000 mg | ORAL_CAPSULE | Freq: Every day | ORAL | 0 refills | Status: DC

## 2020-08-12 NOTE — Addendum Note (Signed)
Addended by: Wyvonnia Lora on: 08/12/2020 02:24 PM   Modules accepted: Orders

## 2020-08-12 NOTE — Telephone Encounter (Signed)
Pt states she has been told by [facility] [phone] that they never received the refill request for her amphetamine-dextroamphetamine (ADDERALL XR) 20 MG 24 hr capsule  .  Please fill

## 2020-08-14 ENCOUNTER — Other Ambulatory Visit: Payer: Self-pay | Admitting: Neurology

## 2020-08-15 ENCOUNTER — Other Ambulatory Visit: Payer: Self-pay | Admitting: Neurology

## 2020-08-17 ENCOUNTER — Other Ambulatory Visit: Payer: Self-pay | Admitting: Neurology

## 2020-09-02 ENCOUNTER — Other Ambulatory Visit: Payer: Self-pay | Admitting: Pain Medicine

## 2020-09-02 ENCOUNTER — Ambulatory Visit
Admission: RE | Admit: 2020-09-02 | Discharge: 2020-09-02 | Disposition: A | Discharge status: Home / Self Care | Payer: Medicare Other | Source: Ambulatory Visit | Attending: Pain Medicine | Admitting: Pain Medicine

## 2020-09-02 DIAGNOSIS — M542 Cervicalgia: Secondary | ICD-10-CM

## 2020-09-10 ENCOUNTER — Encounter: Payer: Self-pay | Admitting: Neurology

## 2020-09-10 ENCOUNTER — Telehealth: Payer: Self-pay | Admitting: Neurology

## 2020-09-10 ENCOUNTER — Other Ambulatory Visit: Payer: Self-pay | Admitting: Neurology

## 2020-09-10 NOTE — Telephone Encounter (Signed)
error 

## 2020-09-10 NOTE — Telephone Encounter (Signed)
I called the patient, she called earlier this evening for refills for tizanidine and amitriptyline.  She has not been seen in the office since April 2021.  She has an appointment on 21 September 2020.  She already has refills on tizanidine.  I will send in a small prescription for amitriptyline.  This patient has no showed two revisits within the last 12 months.

## 2020-09-14 ENCOUNTER — Other Ambulatory Visit: Payer: Self-pay | Admitting: Neurology

## 2020-09-16 ENCOUNTER — Other Ambulatory Visit: Payer: Self-pay | Admitting: Neurology

## 2020-09-21 ENCOUNTER — Encounter: Payer: Self-pay | Admitting: Neurology

## 2020-09-21 ENCOUNTER — Ambulatory Visit (INDEPENDENT_AMBULATORY_CARE_PROVIDER_SITE_OTHER): Payer: Medicare Other | Admitting: Neurology

## 2020-09-21 VITALS — BP 138/86 | HR 120 | Ht 66.0 in | Wt 223.0 lb

## 2020-09-21 DIAGNOSIS — G35 Multiple sclerosis: Secondary | ICD-10-CM | POA: Diagnosis not present

## 2020-09-21 DIAGNOSIS — R5383 Other fatigue: Secondary | ICD-10-CM

## 2020-09-21 DIAGNOSIS — R269 Unspecified abnormalities of gait and mobility: Secondary | ICD-10-CM

## 2020-09-21 DIAGNOSIS — F418 Other specified anxiety disorders: Secondary | ICD-10-CM

## 2020-09-21 DIAGNOSIS — G43009 Migraine without aura, not intractable, without status migrainosus: Secondary | ICD-10-CM

## 2020-09-21 DIAGNOSIS — R531 Weakness: Secondary | ICD-10-CM

## 2020-09-21 DIAGNOSIS — N399 Disorder of urinary system, unspecified: Secondary | ICD-10-CM

## 2020-09-21 DIAGNOSIS — N183 Chronic kidney disease, stage 3 unspecified: Secondary | ICD-10-CM

## 2020-09-21 DIAGNOSIS — R41840 Attention and concentration deficit: Secondary | ICD-10-CM

## 2020-09-21 MED ORDER — SUMATRIPTAN SUCCINATE 100 MG PO TABS: ORAL_TABLET | ORAL | 5 refills | Status: DC

## 2020-09-21 MED ORDER — APIXABAN 5 MG PO TABS: 5.0000 mg | ORAL_TABLET | Freq: Two times a day (BID) | ORAL | 5 refills | Status: DC

## 2020-09-21 MED ORDER — DALFAMPRIDINE ER 10 MG PO TB12: 10.0000 mg | ORAL_TABLET | Freq: Two times a day (BID) | ORAL | 11 refills | Status: DC

## 2020-09-21 MED ORDER — GABAPENTIN 800 MG PO TABS: 800.0000 mg | ORAL_TABLET | Freq: Four times a day (QID) | ORAL | 11 refills | Status: DC

## 2020-09-21 NOTE — Progress Notes (Signed)
[facility]  PATIENT: Dawn Peters DOB: [birth year]  REFERRING DOCTOR OR PCP:  Dr. Annitta Needs SOURCE: Patient, records in EMR, lab results and radiology reports in EMR, MRI images reviewed on PACS.  _________________________________   HISTORICAL  CHIEF COMPLAINT:  Chief Complaint  Patient presents with   Follow-up    Pt alone, rm 1. Following up states overall things are well. Needs refills on meds. Zeposia     HISTORY OF PRESENT ILLNESS:  Dawn Peters is a 40 y.o. woman who was diagnosed with relapsing remitting MS in 2000.     Update 09/21/2020: She is tolerating Zeposia well and has no exacerbation.    She was on [place] and still doing monthly REMS labs.  In between she was also on Ocrevus.      She continues to have mobility issues due to the left leg weakness and spasticity and uses a cane.  On good days she can go over 100 feet without rest.   She will use a chair for longer distance.  She has had progression with more left leg weakness the past few years but is stable compared to her last visit.  She has lost some weight.  Dysesthesias are better on  gabapentin.   She stopped lamotrigine - was only helping a bit.        She has urinary urgency and hesitancy.  She is on tamsulosin.   She sees urology.  She has increased creatinine and sees Nephrologist is at [facility] and reports stable kidney function.     Vision is stable.    Her fatigue is helped by Adderall.   She is sleeping well most nights.  Depression and anxiety are better with the citalopram and anxiety is better with BuSpar .   She is taking vitamin D for her deficiency.  She has 2-3 migraines a month, helped by sunatritan.   She sees [facility] on [address] for other Pain Management.   MS history:   She was diagnosed with MS in 2000 after presenting with gait difficulties, numbness and headaches. An MRI of the brain was consistent with MS. She was then started on  Betaseron. She had difficulty tolerating Betaseron and at some point switched to Novantrone. She took Novantrone every 3 months for a year or so. A little later, she was placed on Tysabri and she stayed on it for a couple of years. However, she was JCV-positive and switched off. She did well on Tysabri. She had been on Tecfidera since 2015. Her MRI from June 2015 showed that there had been no changes compared to an MRI from 2014.   However, she had an exacerbation March 2017.   The 08/07/2013 MRI of the brain shows multiple T2/FLAIR hyperintense foci located in the cerebellum, middle cerebellar peduncles, pons and in the periventricular white matter of the hemispheres in a pattern and configuration consistent with chronic demyelinating plaque associated with MS. There were no acute findings. There was no change compared to the previous MRI from 01/16/2013 that was also reviewed. She switched to [place] in July,2017/Aug 2018.   Due to progression she was shortly on Ocrevus in 2020 and then switched to Zeposia January 2021.    IMAGING:  MRI cervical spine 08/31/2020 showed ubtle T2 hyperintense signal within the cord and there were no acute findings.  MRI brain 08/31/2020 showed Unchanged distribution of white matter lesions in a pattern compatible with multiple sclerosis. No active demyelinating lesions.  REVIEW OF  SYSTEMS: Constitutional: No fevers, chills, sweats, or change in appetite.   She has fatigue.  Some insomnia. Eyes: No visual changes, double vision, eye pain Ear, nose and throat: No hearing loss, ear pain, nasal congestion, sore throat Cardiovascular: No chest pain, palpitations Respiratory:  No shortness of breath at rest or with exertion.   No wheezes GastrointestinaI: No nausea, vomiting, diarrhea, abdominal pain, fecal incontinence Genitourinary:  No dysuria.   She has hesitancy. Sometimes she has difficulty emptying completely Flomax has helped urinary hesitancy. 1 x  nocturia. Musculoskeletal: She notes pain in the left leg and some back pain Integumentary: No rash, pruritus, skin lesions Neurological: as above Psychiatric: Mild depression at this time.  Moderate anxiety Endocrine: No palpitations, diaphoresis, change in appetite, change in weigh or increased thirst Hematologic/Lymphatic:  No anemia, purpura, petechiae. Allergic/Immunologic: No itchy/runny eyes, nasal congestion, recent allergic reactions, rashes  ALLERGIES: Allergies  Allergen Reactions   Ace Inhibitors Swelling   Amoxicillin Itching    Did it involve swelling of the face/tongue/throat, SOB, or low BP? Yes Did it involve sudden or severe rash/hives, skin peeling, or any reaction on the inside of your mouth or nose? No Did you need to seek medical attention at a hospital or doctor's office? No When did it last happen?      unknown  If all above answers are "NO", may proceed with cephalosporin use.;   Celexa [Citalopram] Swelling    mouth   Lisinopril Swelling    Lower lip swelling    HOME MEDICATIONS:  Current Outpatient Medications:    amitriptyline (ELAVIL) 25 MG tablet, TAKE 1 TO 2 TABLETS(25 TO 50 MG) BY MOUTH AT BEDTIME, Disp: 60 tablet, Rfl: 0   amLODipine (NORVASC) 5 MG tablet, Take 1 tablet (5 mg total) by mouth daily. (Patient taking differently: Take 5 mg by mouth daily after breakfast.), Disp: 90 tablet, Rfl: 0   amphetamine-dextroamphetamine (ADDERALL XR) 20 MG 24 hr capsule, Take 1 capsule (20 mg total) by mouth daily., Disp: 30 capsule, Rfl: 0   baclofen (LIORESAL) 20 MG tablet, Take 1 tablet (20 mg total) by mouth 4 (four) times daily as needed for muscle spasms., Disp: 360 tablet, Rfl: 3   busPIRone (BUSPAR) 15 MG tablet, Take 1 tablet (15 mg total) by mouth 2 (two) times daily. Must keep follow up 09/21/20 for ongoing refills, Disp: 60 tablet, Rfl: 1   citalopram (CELEXA) 20 MG tablet, Take 1 tablet (20 mg total) by mouth daily., Disp: 30 tablet, Rfl: 11    methylPREDNISolone (MEDROL DOSEPAK) 4 MG TBPK tablet, Take by mouth. , Disp: , Rfl:    methylPREDNISolone (MEDROL) 4 MG tablet, Taper from 6 pills po for one day to 1 pill po the last day over 6 days, Disp: 21 tablet, Rfl: 0   metoprolol succinate (TOPROL-XL) 25 MG 24 hr tablet, TAKE 1 TABLET BY MOUTH DAILY, Disp: 30 tablet, Rfl: 5   Multiple Vitamin (MULTIVITAMIN WITH MINERALS) TABS tablet, Take 1 tablet by mouth daily., Disp: 30 tablet, Rfl: 0   norethindrone (MICRONOR) 0.35 MG tablet, Take 1 tablet (0.35 mg total) by mouth daily., Disp: 1 Package, Rfl: 11   oxyCODONE-acetaminophen (PERCOCET) 10-325 MG tablet, Take 1 tablet by mouth every 4 (four) hours., Disp: , Rfl:    Ozanimod HCl (ZEPOSIA) 0.92 MG CAPS, TAKE ONE CAPSULE (0.92MG )  BY MOUTH ONE TIME DAILY, Disp: 30 capsule, Rfl: 11   pantoprazole (PROTONIX) 20 MG tablet, Take 1 tablet every morning at least 30 minutes before  first dose of Carafate., Disp: 30 tablet, Rfl: 0   predniSONE (DELTASONE) 10 MG tablet, 6 pills po x 1 day, then 5 pills po x 1 day, then 4 pills po x 1 day, then 3 pills po x 1 day, then 2 pills po x 1 day, then 1 pill po x 1 day, Disp: 21 tablet, Rfl: 0   sucralfate (CARAFATE) 1 g tablet, Take 1 tablet (1 g total) by mouth 4 (four) times daily -  with meals and at bedtime., Disp: 40 tablet, Rfl: 0   tamsulosin (FLOMAX) 0.4 MG CAPS capsule, TAKE 1 CAPSULE(0.4 MG) BY MOUTH DAILY, Disp: 30 capsule, Rfl: 11   tiZANidine (ZANAFLEX) 4 MG tablet, TAKE 1 TABLET BY MOUTH EVERY NIGHT AT BEDTIME, Disp: 30 tablet, Rfl: 10   apixaban (ELIQUIS) 5 MG TABS tablet, Take 1 tablet (5 mg total) by mouth 2 (two) times daily., Disp: 60 tablet, Rfl: 5   dalfampridine 10 MG TB12, Take 1 tablet (10 mg total) by mouth 2 (two) times daily., Disp: 60 tablet, Rfl: 11   gabapentin (NEURONTIN) 800 MG tablet, Take 1 tablet (800 mg total) by mouth 4 (four) times daily., Disp: 120 tablet, Rfl: 11   SUMAtriptan (IMITREX) 100 MG tablet, TAKE 1 TABLET BY MOUTH  1 TIME FOR UP TO 1 DOSE AS NEEDED FOR MIGRAINE. 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   left lower extremity-- treated with eliquis   Gait disturbance    due to MS   GERD (gastroesophageal reflux disease)    watch diet   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/p  THA   History of DVT of lower extremity 2007   History of encephalopathy 00/4599   acute metabolic encephalopathy secondary to MS,  resolved   History of MRSA infection 2004   right hip infection post THA   History of pulmonary embolus (PE) 2005   Hydronephrosis, left    w/ acute kidney injury 02/ 2019   Hypertension    Left-sided weakness    due to MS   Migraines    MS (multiple sclerosis) [facility] neurologist-  dr Renne Musca   dx 2000--   Muscle spasticity    Neurogenic bladder    due to MS   Pulmonary embolism, bilateral ([place]) 09/17/2017   treated w/ eliquis   Strains to urinate    Urgency of urination     PAST SURGICAL HISTORY: Past Surgical History:  Procedure Laterality Date   CYSTOSCOPY WITH RETROGRADE PYELOGRAM, URETEROSCOPY AND STENT PLACEMENT Bilateral 11/29/2017   Procedure: BILATERAL  RETROGRADE PYELOGRAM, LEFT DIAGNOSIC URETEROSCOPY AND STENT LEFT PLACEMENT;  Surgeon: Ardis Hughs, MD;  Location: [facility];  Service: Urology;  Laterality: Bilateral;   HIP ARTHROSCOPY Left 07-05-2013   dr pill @[facility]    iliopsosas release, synovectomy   REVISION TOTAL HIP ARTHROPLASTY Right 03-28-2003    dr Alvan Dame @[facility]    TOTAL HIP ARTHROPLASTY Bilateral left 11-05-2002  dr Alvan Dame @[facility] ;   right 06/ 2004  @[facility]     FAMILY HISTORY: Family History  Problem Relation  Age of Onset   Healthy  Mother    Healthy Father    Breast cancer Other        paternal grandmother dx age 72   Colon cancer Other        paternal grandmother   Hypertension Other    Diabetes Other     SOCIAL HISTORY:  Social History   Socioeconomic History   Marital status: Single    Spouse name: Not on file   Number of children: Not on file   Years of education: Not on file   Highest education level: Not on file  Occupational History   Not on file  Tobacco Use   Smoking status: Never   Smokeless tobacco: Never  Vaping Use   Vaping Use: Never used  Substance and Sexual Activity   Alcohol use: No   Drug use: No   Sexual activity: Not Currently    Birth control/protection: Pill  Other Topics Concern   Not on file  Social History Narrative   Not on file   Social Determinants of Health   Financial Resource Strain: Not on file  Food Insecurity: Not on file  Transportation Needs: Not on file  Physical Activity: Not on file  Stress: Not on file  Social Connections: Not on file  Intimate Partner Violence: Not on file     PHYSICAL EXAM  Vitals:   09/21/20 0841  BP: 138/86  Pulse: (!) 120  Weight: 223 lb (101.2 kg)  Height: 5\' 6"  (1.676 m)    Body mass index is 35.99 kg/m.   General: The patient is well-developed and well-nourished and in no acute distress.  Neurologic Exam  Mental status: The patient is alert and oriented x 3 at the time of the examination. The patient has apparent normal recent and remote memory, with mildly reduced attention span and concentration ability.   Speech is normal.  Cranial nerves: Extraocular movements are full.  Facial strength and sensation is normal.  Trapezius strength is normal.. No obvious hearing deficits are noted.  Motor:  Muscle bulk is normal.  She has increased muscle tone in the left leg.  Strength is normal in the arms and right leg and 3/5 left hip flexion,  4-/5 in the distal left leg and 4+/5  quads  Sensory: Touch and vibration sensation is normal in the arms and legs.  Coordination: She performs finger-nose-finger well.  She has reduced heel-to-shin, left worse than right.     Gait and station:She is able to walk without unilateral support but does better with her cane.  She has left foot drop.   She cannot tandem walk.    Reflexes: Deep tendon reflexes are increased in the legs, left greater than right.  She has crossed abductor responses, worse on the left.  She has nonsustained clonus in the left ankle    DIAGNOSTIC DATA (LABS, IMAGING, TESTING) - I reviewed patient records, labs, notes, testing and imaging myself where available.  Lab Results  Component Value Date   WBC 7.0 09/01/2019   HGB 13.7 09/01/2019   HCT 40.1 09/01/2019   MCV 96.9 09/01/2019   PLT 272 09/01/2019      Component Value Date/Time   NA 138 09/01/2019 0503   NA 144 06/21/2017 1626   K 3.8 09/01/2019 0503   CL 103 09/01/2019 0503   CO2 26 09/01/2019 0503   GLUCOSE 90 09/01/2019 0503   BUN 18 09/01/2019 0503   BUN 16 06/21/2017 1626   CREATININE 1.64 (H) 09/01/2019 0503   CREATININE  0.85 07/03/2014 0959   CALCIUM 9.0 09/01/2019 0503   PROT 7.0 09/01/2019 0503   PROT 7.5 01/11/2018 1542   ALBUMIN 4.2 09/01/2019 0503   ALBUMIN 4.2 01/11/2018 1542   AST 23 09/01/2019 0503   ALT 23 09/01/2019 0503   ALKPHOS 80 09/01/2019 0503   BILITOT 0.6 09/01/2019 0503   BILITOT 0.5 01/11/2018 1542   GFRNONAA 38 (L) 09/01/2019 0503   GFRNONAA 88 07/03/2014 0959   GFRAA 45 (L) 09/01/2019 0503   GFRAA >89 07/03/2014 0959   Lab Results  Component Value Date   CHOL 228 (H) 04/20/2017   HDL 77 04/20/2017   LDLCALC 142 (H) 04/20/2017   TRIG 46 04/20/2017   CHOLHDL 3.0 04/20/2017   Lab Results  Component Value Date   HGBA1C 5.3 04/20/2017   Lab Results  Component Value Date   VITAMINB12 228 03/15/2018   Lab Results  Component Value Date   TSH 1.140 02/19/2019      ASSESSMENT AND  PLAN    1. MS (multiple sclerosis) ([place])   2. Left-sided weakness   3. Gait disturbance   4. Other fatigue   5. Urinary disorder   6. Stage 3 chronic kidney disease, unspecified whether stage 3a or 3b CKD ([place])   7. Depression with anxiety   8. Attention deficit   9. Migraine without aura and without status migrainosus, not intractable       1.   continue Zeposia.  She is still getting blood work from the [facility]. 2.   ,Renew sumatriptan anddalfampridine and gabapentin \ 3.    Renew Adderall. 4.   Continue Eliquis for DVT/PE 5.    She will return to see me in 4 months or sooner if there are new or worsening neurologic symptoms.   Romel Dumond A. Felecia Shelling, MD, PhD 06/03/5186, 4:16 AM Certified in Neurology, Clinical Neurophysiology, Sleep Medicine, Pain Medicine and Neuroimaging  [facility] [address] [place], [place] [address] [phone] ]

## 2020-10-08 ENCOUNTER — Encounter: Payer: Self-pay | Admitting: Neurology

## 2020-10-13 ENCOUNTER — Other Ambulatory Visit: Payer: Self-pay | Admitting: Pain Medicine

## 2020-10-13 DIAGNOSIS — M545 Low back pain, unspecified: Secondary | ICD-10-CM

## 2020-10-13 DIAGNOSIS — M79606 Pain in leg, unspecified: Secondary | ICD-10-CM

## 2020-10-23 ENCOUNTER — Other Ambulatory Visit: Payer: Medicare Other

## 2020-10-24 ENCOUNTER — Other Ambulatory Visit: Payer: Self-pay | Admitting: Neurology

## 2020-10-27 ENCOUNTER — Other Ambulatory Visit: Payer: Self-pay | Admitting: Neurology

## 2020-11-05 ENCOUNTER — Other Ambulatory Visit: Payer: Self-pay | Admitting: Neurology

## 2020-11-05 MED ORDER — AMPHETAMINE-DEXTROAMPHET ER 20 MG PO CP24: 20.0000 mg | ORAL_CAPSULE | Freq: Every day | ORAL | 0 refills | Status: DC

## 2020-11-05 NOTE — Telephone Encounter (Signed)
Pt called requesting refill for amphetamine-dextroamphetamine (ADDERALL XR) 20 MG 24 hr capsule. Pharmacy [place] [phone].

## 2020-11-05 NOTE — Telephone Encounter (Signed)
Checked drug registry. She last refilled 09/14/20 #30. Last seen 09/21/20 and next follow up 03/24/21.

## 2020-11-17 ENCOUNTER — Other Ambulatory Visit: Payer: Self-pay | Admitting: Neurology

## 2020-11-18 ENCOUNTER — Telehealth: Payer: Self-pay | Admitting: Neurology

## 2020-11-18 NOTE — Telephone Encounter (Signed)
Called the patient back. She states that her legs have started to bother her and that she can't hardly walk. She denies no fever and denies signs of infection. She states this is very similar to when she has exacerbation and needs steroids. I was able to get the patient worked in for apt with Dr Felecia Shelling 9 am on 9/15. Pt verbalized understanding.

## 2020-11-18 NOTE — Telephone Encounter (Signed)
Pt is asking for a call to discuss coming in for a treatment due to weakness she is having, please call

## 2020-11-19 ENCOUNTER — Telehealth: Payer: Self-pay | Admitting: Neurology

## 2020-11-19 ENCOUNTER — Ambulatory Visit: Payer: Medicare Other | Admitting: Neurology

## 2020-11-19 NOTE — Telephone Encounter (Signed)
FYI- Pt called had to reschedule her appt that was scheduled for today due to lack of transportation.

## 2020-11-20 ENCOUNTER — Emergency Department (HOSPITAL_COMMUNITY): Payer: Medicare Other

## 2020-11-20 ENCOUNTER — Encounter (HOSPITAL_BASED_OUTPATIENT_CLINIC_OR_DEPARTMENT_OTHER): Payer: Self-pay | Admitting: *Deleted

## 2020-11-20 ENCOUNTER — Other Ambulatory Visit: Payer: Self-pay

## 2020-11-20 ENCOUNTER — Inpatient Hospital Stay (HOSPITAL_BASED_OUTPATIENT_CLINIC_OR_DEPARTMENT_OTHER)
Admission: EM | Admit: 2020-11-20 | Discharge: 2020-11-27 | DRG: 804 | Disposition: A | Discharge status: Home / Self Care | Payer: Medicare Other | Attending: Internal Medicine | Admitting: Internal Medicine

## 2020-11-20 DIAGNOSIS — Z888 Allergy status to other drugs, medicaments and biological substances status: Secondary | ICD-10-CM

## 2020-11-20 DIAGNOSIS — Z79891 Long term (current) use of opiate analgesic: Secondary | ICD-10-CM

## 2020-11-20 DIAGNOSIS — G35 Multiple sclerosis: Secondary | ICD-10-CM | POA: Diagnosis present

## 2020-11-20 DIAGNOSIS — K5900 Constipation, unspecified: Secondary | ICD-10-CM | POA: Diagnosis not present

## 2020-11-20 DIAGNOSIS — Z79899 Other long term (current) drug therapy: Secondary | ICD-10-CM

## 2020-11-20 DIAGNOSIS — Z86718 Personal history of other venous thrombosis and embolism: Secondary | ICD-10-CM

## 2020-11-20 DIAGNOSIS — Z880 Allergy status to penicillin: Secondary | ICD-10-CM

## 2020-11-20 DIAGNOSIS — K7689 Other specified diseases of liver: Secondary | ICD-10-CM | POA: Diagnosis present

## 2020-11-20 DIAGNOSIS — Z800 Family history of malignant neoplasm of digestive organs: Secondary | ICD-10-CM

## 2020-11-20 DIAGNOSIS — Z833 Family history of diabetes mellitus: Secondary | ICD-10-CM

## 2020-11-20 DIAGNOSIS — D649 Anemia, unspecified: Secondary | ICD-10-CM

## 2020-11-20 DIAGNOSIS — G8929 Other chronic pain: Secondary | ICD-10-CM | POA: Diagnosis present

## 2020-11-20 DIAGNOSIS — N319 Neuromuscular dysfunction of bladder, unspecified: Secondary | ICD-10-CM | POA: Diagnosis present

## 2020-11-20 DIAGNOSIS — D619 Aplastic anemia, unspecified: Principal | ICD-10-CM | POA: Diagnosis present

## 2020-11-20 DIAGNOSIS — Z20822 Contact with and (suspected) exposure to covid-19: Secondary | ICD-10-CM | POA: Diagnosis present

## 2020-11-20 DIAGNOSIS — F419 Anxiety disorder, unspecified: Secondary | ICD-10-CM | POA: Diagnosis present

## 2020-11-20 DIAGNOSIS — R269 Unspecified abnormalities of gait and mobility: Secondary | ICD-10-CM

## 2020-11-20 DIAGNOSIS — Z86711 Personal history of pulmonary embolism: Secondary | ICD-10-CM

## 2020-11-20 DIAGNOSIS — I129 Hypertensive chronic kidney disease with stage 1 through stage 4 chronic kidney disease, or unspecified chronic kidney disease: Secondary | ICD-10-CM | POA: Diagnosis present

## 2020-11-20 DIAGNOSIS — Z803 Family history of malignant neoplasm of breast: Secondary | ICD-10-CM

## 2020-11-20 DIAGNOSIS — Z881 Allergy status to other antibiotic agents status: Secondary | ICD-10-CM

## 2020-11-20 DIAGNOSIS — F32A Depression, unspecified: Secondary | ICD-10-CM | POA: Diagnosis present

## 2020-11-20 DIAGNOSIS — N1831 Chronic kidney disease, stage 3a: Secondary | ICD-10-CM | POA: Diagnosis present

## 2020-11-20 DIAGNOSIS — Z7901 Long term (current) use of anticoagulants: Secondary | ICD-10-CM

## 2020-11-20 DIAGNOSIS — D638 Anemia in other chronic diseases classified elsewhere: Principal | ICD-10-CM | POA: Diagnosis present

## 2020-11-20 DIAGNOSIS — Z8249 Family history of ischemic heart disease and other diseases of the circulatory system: Secondary | ICD-10-CM

## 2020-11-20 DIAGNOSIS — K219 Gastro-esophageal reflux disease without esophagitis: Secondary | ICD-10-CM | POA: Diagnosis present

## 2020-11-20 DIAGNOSIS — R29898 Other symptoms and signs involving the musculoskeletal system: Secondary | ICD-10-CM | POA: Diagnosis not present

## 2020-11-20 DIAGNOSIS — W19XXXA Unspecified fall, initial encounter: Secondary | ICD-10-CM | POA: Diagnosis present

## 2020-11-20 DIAGNOSIS — Z8614 Personal history of Methicillin resistant Staphylococcus aureus infection: Secondary | ICD-10-CM

## 2020-11-20 LAB — CBC WITH DIFFERENTIAL/PLATELET
Abs Immature Granulocytes: 0 10*3/uL (ref 0.00–0.07)
Basophils Absolute: 0 10*3/uL (ref 0.0–0.1)
Basophils Relative: 0 %
Eosinophils Absolute: 0 10*3/uL (ref 0.0–0.5)
Eosinophils Relative: 0 %
HCT: 14.3 % — ABNORMAL LOW (ref 36.0–46.0)
Hemoglobin: 5 g/dL — CL (ref 12.0–15.0)
Lymphocytes Relative: 10 %
Lymphs Abs: 0.4 10*3/uL — ABNORMAL LOW (ref 0.7–4.0)
MCH: 32.5 pg (ref 26.0–34.0)
MCHC: 35 g/dL (ref 30.0–36.0)
MCV: 92.9 fL (ref 80.0–100.0)
Monocytes Absolute: 0.3 10*3/uL (ref 0.1–1.0)
Monocytes Relative: 6 %
Neutro Abs: 3.6 10*3/uL (ref 1.7–7.7)
Neutrophils Relative %: 84 %
Platelets: 296 10*3/uL (ref 150–400)
RBC: 1.54 MIL/uL — ABNORMAL LOW (ref 3.87–5.11)
RDW: 14.3 % (ref 11.5–15.5)
Smear Review: NORMAL
WBC: 4.3 10*3/uL (ref 4.0–10.5)
nRBC: 0 % (ref 0.0–0.2)

## 2020-11-20 LAB — BASIC METABOLIC PANEL
Anion gap: 10 (ref 5–15)
BUN: 15 mg/dL (ref 6–20)
CO2: 26 mmol/L (ref 22–32)
Calcium: 9.4 mg/dL (ref 8.9–10.3)
Chloride: 104 mmol/L (ref 98–111)
Creatinine, Ser: 1.36 mg/dL — ABNORMAL HIGH (ref 0.44–1.00)
GFR, Estimated: 50 mL/min — ABNORMAL LOW (ref >60)
Glucose, Bld: 92 mg/dL (ref 70–99)
Potassium: 3.6 mmol/L (ref 3.5–5.1)
Sodium: 140 mmol/L (ref 135–145)

## 2020-11-20 LAB — MAGNESIUM: Magnesium: 2.1 mg/dL (ref 1.7–2.4)

## 2020-11-20 MED ORDER — LORAZEPAM 2 MG/ML IJ SOLN: 1.0000 mg | Freq: Once | INTRAMUSCULAR | Status: DC

## 2020-11-20 MED ORDER — BUSPIRONE HCL 10 MG PO TABS
15.0000 mg | ORAL_TABLET | Freq: Once | ORAL | Status: AC
  Administered 2020-11-21: 15 mg via ORAL
  Filled 2020-11-20: qty 2

## 2020-11-20 NOTE — ED Provider Notes (Signed)
[facility] for MRI to rule out MS flare. She has MS with baseline left-sided weakness. She states that these symptoms are worse recently and she had a fall due to worsening weakness. She also reports midline lower back pain that is tight in nature and that is been ongoing for the last week. No injuries. No rational bleeding, LMP was two weeks ago and it was light for her. Takes eliquis for hx/o DVT/PE.  Does not miss doses.    On arrival to this department she is found to be anemic, which is new for her. No external evidence of bleeding on examination. Will send anemia panel. Discussed recommendation for transfusion for symptomatic anemia. Her CBC was rechecked to confirm her anemia. Plan to obtain CT stone study to further evaluate her back pain and rule out retroperitoneal hemorrhage. Medicine consulted for admission for ongoing workup and treatment.  CRITICAL CARE Performed by: Quintella Reichert   Total critical care time: 45 minutes  Critical care time was exclusive of separately billable procedures and treating other patients.  Critical care was necessary to treat or prevent imminent or life-threatening deterioration.  Critical care was time spent personally by me on the following activities: development of treatment plan with patient and/or surrogate as well as nursing, discussions with consultants, evaluation of patient's response to treatment, examination of patient, obtaining history from patient or surrogate, ordering and performing treatments and interventions, ordering and review of laboratory studies, ordering and review of radiographic studies, pulse oximetry and re-evaluation of patient's condition.    Quintella Reichert, MD 11/21/20 [phone]

## 2020-11-20 NOTE — ED Notes (Signed)
Lab notified this RN that hmg 5. Pt now at [facility] ED. Agricultural consultant notified. Dr. Tyrone Nine notified

## 2020-11-20 NOTE — ED Notes (Signed)
Pt is alert and oriented at this time.  She reports no change in condition since leaving [facility].  Additional labs being taken in triage.

## 2020-11-20 NOTE — ED Notes (Signed)
Cancelled [facility].  Patient is going ED to ED but has someone to take her POV

## 2020-11-20 NOTE — ED Provider Notes (Signed)
[facility] EMERG[facility] DEPT Provider Note   CSN: [id number] Arrival date & time: 11/20/20  1549     History Chief Complaint  Patient presents with   MS Flare    Dawn Peters is a 40 y.o. female.  40 yo F with a chief complaint of an MS flare.  Patient states that she has had symptoms like this previously with an MS flare.  Has experienced some progressive left-sided weakness much worse in the leg than the arm.  She ended up having a fall today.  Denies any injury in the fall.  Has some back pain off and on but not progressively worsening for of her concern.  She called her neurologist and had an appointment yesterday but was unable to make it due to transportation issues.  With symptoms worsening today she decided come to the ED for evaluation.  The history is provided by the patient.  Illness Severity:  Moderate Onset quality:  Gradual Duration:  3 days Timing:  Constant Progression:  Worsening Chronicity:  Recurrent Associated symptoms: no chest pain, no congestion, no fever, no headaches, no myalgias, no nausea, no rhinorrhea, no shortness of breath, no vomiting and no wheezing       Past Medical History:  Diagnosis Date   Anticoagulant long-term use    Eliquis   Anxiety    Chronic pain    CKD (chronic kidney disease), stage III (HCC)    Depression    DVT, lower extremity, recurrent ([place]) 09/17/2017   left lower extremity-- treated with eliquis   Gait disturbance    due to MS   GERD (gastroesophageal reflux disease)    watch diet   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/p  THA   History of DVT of lower extremity 2007   History of encephalopathy [id number]   acute metabolic encephalopathy secondary to MS,  resolved   History of MRSA infection 2004   right hip infection post THA   History of pulmonary embolus (PE) 2005   Hydronephrosis, left    w/ acute kidney injury 02/ 2019    Hypertension    Left-sided weakness    due to MS   Migraines    MS (multiple sclerosis) [facility]) neurologist-  dr Renne Musca   dx 2000--   Muscle spasticity    Neurogenic bladder    due to MS   Pulmonary embolism, bilateral ([place]) 09/17/2017   treated w/ eliquis   Strains to urinate    Urgency of urination     Patient Active Problem List   Diagnosis Date Noted   Amenorrhea 11/20/2018   Well woman exam 11/20/2018   MS (multiple sclerosis) ([place]) 03/14/2018   Stage 3 chronic kidney disease ([place])    Obesity (BMI 30.0-34.9)    Anemia 01/09/2018   Pulmonary embolism ([place]) 09/18/2017   Elevated serum creatinine 05/12/2017   Acute encephalopathy 04/20/2017   Hydronephrosis of left kidney 10/24/2016   Attention deficit 10/24/2016   Left leg weakness 08/13/2016   Chronic pain 08/13/2016   Mild renal insufficiency 08/13/2016   Left-sided weakness    High risk medication use 09/15/2015   Hip pain, bilateral 07/28/2015   Urinary hesitancy 07/28/2015   Multiple sclerosis exacerbation ([place]) 07/17/2015   Urinary disorder 06/11/2015   Gait disturbance 05/19/2015   Numbness 05/19/2015   Depression with anxiety 05/13/2015   Other fatigue 05/13/2015   Left knee pain 05/13/2015   Avascular necrosis of  bones of both hips ([place]) 05/13/2015   Screening for HIV (human immunodeficiency virus) 07/08/2014   Essential hypertension, benign 11/19/2013   Anxiety state, unspecified 11/19/2013   Screen for STD (sexually transmitted disease) 11/01/2013   Encounter for routine gynecological examination 11/01/2013   Oral contraceptive use 10/20/2011   Multiple sclerosis ([place]) 08/08/2007   RASH AND OTHER NONSPECIFIC SKIN ERUPTION 06/22/2007   AVASCULAR NECROSIS, FEMORAL HEAD 03/11/2006   Essential hypertension 02/03/2006   MICROALBUMINURIA 02/03/2006   DVT, HX OF 02/03/2006    Past Surgical History:  Procedure Laterality Date   CYSTOSCOPY WITH RETROGRADE PYELOGRAM, URETEROSCOPY AND STENT PLACEMENT  Bilateral 11/29/2017   Procedure: BILATERAL  RETROGRADE PYELOGRAM, LEFT DIAGNOSIC URETEROSCOPY AND STENT LEFT PLACEMENT;  Surgeon: Ardis Hughs, MD;  Location: [facility];  Service: Urology;  Laterality: Bilateral;   HIP ARTHROSCOPY Left 07-05-2013   dr pill '@[facility]'$    iliopsosas release, synovectomy   REVISION TOTAL HIP ARTHROPLASTY Right 03-28-2003    dr Alvan Dame '@[facility]'$    TOTAL HIP ARTHROPLASTY Bilateral left 11-05-2002  dr Alvan Dame '@[facility]'$ ;   right 06/ 2004  '@[facility]'$      OB History     Gravida  1   Para  1   Term  1   Preterm      AB      Living  1      SAB      IAB      Ectopic      Multiple      Live Births  1           Family History  Problem Relation Age of Onset   Healthy Mother    Healthy Father    Breast cancer Other        paternal grandmother dx age 66   Colon cancer Other        paternal grandmother   Hypertension Other    Diabetes Other     Social History   Tobacco Use   Smoking status: Never   Smokeless tobacco: Never  Vaping Use   Vaping Use: Never used  Substance Use Topics   Alcohol use: No   Drug use: No    Home Medications Prior to Admission medications   Medication Sig Start Date End Date Taking? Authorizing Provider  amitriptyline (ELAVIL) 25 MG tablet TAKE 1 TO 2 TABLETS(25 TO 50 MG) BY MOUTH AT BEDTIME 11/17/20  Yes Sater, Nanine Means, MD  amLODipine (NORVASC) 5 MG tablet Take 1 tablet (5 mg total) by mouth daily. Patient taking differently: Take 5 mg by mouth daily after breakfast. 05/21/17  Yes Arrien, Jimmy Picket, MD  amphetamine-dextroamphetamine (ADDERALL XR) 20 MG 24 hr capsule Take 1 capsule (20 mg total) by mouth daily. 11/05/20  Yes Sater, Nanine Means, MD  apixaban (ELIQUIS) 5 MG TABS tablet Take 1 tablet (5 mg total) by mouth 2 (two) times daily. 09/21/20  Yes Sater, Nanine Means, MD  baclofen (LIORESAL) 20 MG tablet Take 1 tablet (20 mg total) by mouth 4 (four) times daily as needed for muscle spasms. 09/16/19  Yes  Sater, Nanine Means, MD  busPIRone (BUSPAR) 15 MG tablet Take 1 tablet (15 mg total) by mouth 2 (two) times daily. Must keep follow up 09/21/20 for ongoing refills 08/18/20  Yes Sater, Nanine Means, MD  citalopram (CELEXA) 20 MG tablet Take 1 tablet (20 mg total) by mouth daily. 02/19/19  Yes Sater, Nanine Means, MD  dalfampridine 10 MG TB12 Take 1 tablet (10 mg  total) by mouth 2 (two) times daily. 09/21/20  Yes Sater, Nanine Means, MD  gabapentin (NEURONTIN) 800 MG tablet Take 1 tablet (800 mg total) by mouth 4 (four) times daily. 09/21/20  Yes Sater, Nanine Means, MD  metoprolol succinate (TOPROL-XL) 25 MG 24 hr tablet TAKE 1 TABLET BY MOUTH DAILY 08/25/19  Yes Sater, Nanine Means, MD  Multiple Vitamin (MULTIVITAMIN WITH MINERALS) TABS tablet Take 1 tablet by mouth daily. 09/20/17  Yes Hall, Carole N, DO  pantoprazole (PROTONIX) 20 MG tablet Take 1 tablet every morning at least 30 minutes before first dose of Carafate. 07/19/19  Yes Molpus, John, MD  tamsulosin (FLOMAX) 0.4 MG CAPS capsule TAKE 1 CAPSULE(0.4 MG) BY MOUTH DAILY 08/05/20  Yes Sater, Nanine Means, MD  tiZANidine (ZANAFLEX) 4 MG tablet TAKE 1 TABLET BY MOUTH EVERY NIGHT AT BEDTIME 08/05/20  Yes Sater, Nanine Means, MD  methylPREDNISolone (MEDROL DOSEPAK) 4 MG TBPK tablet Take by mouth.  08/19/19   [provider]  methylPREDNISolone (MEDROL) 4 MG tablet Taper from 6 pills po for one day to 1 pill po the last day over 6 days 09/01/19   Garald Balding, PA-C  norethindrone (MICRONOR) 0.35 MG tablet Take 1 tablet (0.35 mg total) by mouth daily. 11/19/18   Clarnce Flock, MD  oxyCODONE-acetaminophen (PERCOCET) 10-325 MG tablet Take 1 tablet by mouth every 4 (four) hours. 03/18/18   [provider]  Ozanimod HCl (ZEPOSIA) 0.92 MG CAPS TAKE ONE CAPSULE (0.'92MG'$ )  BY MOUTH ONE TIME DAILY 03/09/20   Sater, Nanine Means, MD  predniSONE (DELTASONE) 10 MG tablet 6 pills po x 1 day, then 5 pills po x 1 day, then 4 pills po x 1 day, then 3 pills po x 1 day, then 2 pills  po x 1 day, then 1 pill po x 1 day 11/20/19   Sater, Nanine Means, MD  sucralfate (CARAFATE) 1 g tablet Take 1 tablet (1 g total) by mouth 4 (four) times daily -  with meals and at bedtime. 07/19/19   Molpus, John, MD  SUMAtriptan (IMITREX) 100 MG tablet TAKE 1 TABLET BY MOUTH 1 TIME FOR UP TO 1 DOSE AS NEEDED FOR MIGRAINE. MAY REPEAT IN 2 HOURS IF HEADACHE PERSISTS OR RECURS 09/21/20   Sater, Nanine Means, MD  Oxcarbazepine (TRILEPTAL) 300 MG tablet Take 1 tablet (300 mg total) by mouth 2 (two) times daily. Patient not taking: Reported on 07/19/2019 11/11/15 07/19/19  Britt Bottom, MD    Allergies    Ace inhibitors, Amoxicillin, Celexa [citalopram], and Lisinopril  Review of Systems   Review of Systems  Constitutional:  Negative for chills and fever.  HENT:  Negative for congestion and rhinorrhea.   Eyes:  Negative for redness and visual disturbance.  Respiratory:  Negative for shortness of breath and wheezing.   Cardiovascular:  Negative for chest pain and palpitations.  Gastrointestinal:  Negative for nausea and vomiting.  Genitourinary:  Negative for dysuria and urgency.  Musculoskeletal:  Positive for back pain. Negative for arthralgias and myalgias.  Skin:  Negative for pallor and wound.  Neurological:  Positive for weakness (leg). Negative for dizziness and headaches.   Physical Exam Updated Vital Signs BP 126/61   Pulse (!) 103   Temp 98.1 F (36.7 C) (Oral)   Resp 20   Ht '5\' 6"'$  (1.676 m)   Wt 102.1 kg   SpO2 100%   BMI 36.32 kg/m   Physical Exam Vitals and nursing note reviewed.  Constitutional:  General: She is not in acute distress.    Appearance: She is well-developed. She is not diaphoretic.  HENT:     Head: Normocephalic and atraumatic.  Eyes:     Pupils: Pupils are equal, round, and reactive to light.  Cardiovascular:     Rate and Rhythm: Normal rate and regular rhythm.     Heart sounds: No murmur heard.   No friction rub. No gallop.  Pulmonary:     Effort:  Pulmonary effort is normal.     Breath sounds: No wheezing or rales.  Abdominal:     General: There is no distension.     Palpations: Abdomen is soft.     Tenderness: There is no abdominal tenderness.  Musculoskeletal:        General: No tenderness.     Cervical back: Normal range of motion and neck supple.  Skin:    General: Skin is warm and dry.  Neurological:     Mental Status: She is alert and oriented to person, place, and time.     Comments: 4 out of 5 muscle strength to the left lower extremity compared to the right.  She has about 4-5 beats of clonus on that side.  Positive Babinski.  Diminished reflexes on that side.  Psychiatric:        Behavior: Behavior normal.    ED Results / Procedures / Treatments   Labs (all labs ordered are listed, but only abnormal results are displayed) Labs Reviewed  CBC WITH DIFFERENTIAL/PLATELET  BASIC METABOLIC PANEL  MAGNESIUM    EKG None  Radiology No results found.  Procedures Procedures   Medications Ordered in ED Medications - No data to display  ED Course  I have reviewed the triage vital signs and the nursing notes.  Pertinent labs & imaging results that were available during my care of the patient were reviewed by me and considered in my medical decision making (see chart for details).    MDM Rules/Calculators/A&P                           40 yo F with a chief complaint of left lower extremity weakness.  Consistent with her prior MS flares.  Will discuss with neurology.  I discussed the case with Dr. Theda Sers.  Did recommend sending them to [facility] for MRI of the brain and T-spine with and without contrast.  He felt a bit did show that there was an MS flare than he would likely recommend 1250 mg of prednisone daily as therapy.  I discussed the case with Dr. Sherry Ruffing who accepts the patient in ED to ED transfer.  The patients results and plan were reviewed and discussed.   Any x-rays performed were independently reviewed by  myself.   Differential diagnosis were considered with the presenting HPI.  Medications - No data to display  Vitals:   11/20/20 1626 11/20/20 1627 11/20/20 1821  BP:  135/72 126/61  Pulse:  (!) 102 (!) 103  Resp:  15 20  Temp:  98.1 F (36.7 C)   TempSrc:  Oral   SpO2:  100% 100%  Weight: 102.1 kg    Height: '5\' 6"'$  (1.676 m)      Final diagnoses:  Left leg weakness   Final Clinical Impression(s) / ED Diagnoses Final diagnoses:  Left leg weakness    Rx / DC Orders ED Discharge Orders     None  Deno Etienne, DO 11/20/20 1935

## 2020-11-20 NOTE — ED Notes (Signed)
Alerted nurse patient advised her ride is here to take her to [facility]

## 2020-11-20 NOTE — ED Notes (Signed)
Pt stated that she has had some knee trouble and fell once earlier today (mechanical Fall) before coming to the ED. Pt did not hit head. Pt made a moderate fall risk  with at least one assist. Arm band given for same reason

## 2020-11-20 NOTE — ED Notes (Addendum)
Per Blood lab, pt does have antibodies, they recommend we not use the emergency release blood.  Call them for additional questions.

## 2020-11-21 ENCOUNTER — Emergency Department (HOSPITAL_COMMUNITY): Payer: Medicare Other

## 2020-11-21 DIAGNOSIS — Z8614 Personal history of Methicillin resistant Staphylococcus aureus infection: Secondary | ICD-10-CM | POA: Diagnosis not present

## 2020-11-21 DIAGNOSIS — Z20822 Contact with and (suspected) exposure to covid-19: Secondary | ICD-10-CM | POA: Diagnosis present

## 2020-11-21 DIAGNOSIS — N189 Chronic kidney disease, unspecified: Secondary | ICD-10-CM | POA: Diagnosis not present

## 2020-11-21 DIAGNOSIS — D649 Anemia, unspecified: Secondary | ICD-10-CM | POA: Diagnosis present

## 2020-11-21 DIAGNOSIS — D509 Iron deficiency anemia, unspecified: Secondary | ICD-10-CM | POA: Diagnosis not present

## 2020-11-21 DIAGNOSIS — Z888 Allergy status to other drugs, medicaments and biological substances status: Secondary | ICD-10-CM | POA: Diagnosis not present

## 2020-11-21 DIAGNOSIS — Z800 Family history of malignant neoplasm of digestive organs: Secondary | ICD-10-CM | POA: Diagnosis not present

## 2020-11-21 DIAGNOSIS — Z881 Allergy status to other antibiotic agents status: Secondary | ICD-10-CM | POA: Diagnosis not present

## 2020-11-21 DIAGNOSIS — Z86718 Personal history of other venous thrombosis and embolism: Secondary | ICD-10-CM | POA: Diagnosis not present

## 2020-11-21 DIAGNOSIS — G8929 Other chronic pain: Secondary | ICD-10-CM | POA: Diagnosis present

## 2020-11-21 DIAGNOSIS — Z8249 Family history of ischemic heart disease and other diseases of the circulatory system: Secondary | ICD-10-CM | POA: Diagnosis not present

## 2020-11-21 DIAGNOSIS — Z86711 Personal history of pulmonary embolism: Secondary | ICD-10-CM | POA: Diagnosis not present

## 2020-11-21 DIAGNOSIS — R29898 Other symptoms and signs involving the musculoskeletal system: Secondary | ICD-10-CM | POA: Diagnosis present

## 2020-11-21 DIAGNOSIS — I129 Hypertensive chronic kidney disease with stage 1 through stage 4 chronic kidney disease, or unspecified chronic kidney disease: Secondary | ICD-10-CM | POA: Diagnosis present

## 2020-11-21 DIAGNOSIS — G35 Multiple sclerosis: Secondary | ICD-10-CM | POA: Diagnosis present

## 2020-11-21 DIAGNOSIS — D638 Anemia in other chronic diseases classified elsewhere: Secondary | ICD-10-CM | POA: Diagnosis present

## 2020-11-21 DIAGNOSIS — K7689 Other specified diseases of liver: Secondary | ICD-10-CM | POA: Diagnosis present

## 2020-11-21 DIAGNOSIS — D619 Aplastic anemia, unspecified: Secondary | ICD-10-CM | POA: Diagnosis present

## 2020-11-21 DIAGNOSIS — N319 Neuromuscular dysfunction of bladder, unspecified: Secondary | ICD-10-CM | POA: Diagnosis present

## 2020-11-21 DIAGNOSIS — K5900 Constipation, unspecified: Secondary | ICD-10-CM | POA: Diagnosis not present

## 2020-11-21 DIAGNOSIS — F32A Depression, unspecified: Secondary | ICD-10-CM | POA: Diagnosis present

## 2020-11-21 DIAGNOSIS — Z833 Family history of diabetes mellitus: Secondary | ICD-10-CM | POA: Diagnosis not present

## 2020-11-21 DIAGNOSIS — W19XXXA Unspecified fall, initial encounter: Secondary | ICD-10-CM | POA: Diagnosis present

## 2020-11-21 DIAGNOSIS — F419 Anxiety disorder, unspecified: Secondary | ICD-10-CM | POA: Diagnosis present

## 2020-11-21 DIAGNOSIS — N1831 Chronic kidney disease, stage 3a: Secondary | ICD-10-CM | POA: Diagnosis present

## 2020-11-21 DIAGNOSIS — B343 Parvovirus infection, unspecified: Secondary | ICD-10-CM | POA: Diagnosis not present

## 2020-11-21 DIAGNOSIS — Z79899 Other long term (current) drug therapy: Secondary | ICD-10-CM | POA: Diagnosis not present

## 2020-11-21 DIAGNOSIS — Z79891 Long term (current) use of opiate analgesic: Secondary | ICD-10-CM | POA: Diagnosis not present

## 2020-11-21 DIAGNOSIS — Z7901 Long term (current) use of anticoagulants: Secondary | ICD-10-CM | POA: Diagnosis not present

## 2020-11-21 DIAGNOSIS — Z880 Allergy status to penicillin: Secondary | ICD-10-CM | POA: Diagnosis not present

## 2020-11-21 DIAGNOSIS — Z803 Family history of malignant neoplasm of breast: Secondary | ICD-10-CM | POA: Diagnosis not present

## 2020-11-21 LAB — CBC WITH DIFFERENTIAL/PLATELET
Abs Immature Granulocytes: 0.07 10*3/uL (ref 0.00–0.07)
Basophils Absolute: 0 10*3/uL (ref 0.0–0.1)
Basophils Relative: 0 %
Eosinophils Absolute: 0 10*3/uL (ref 0.0–0.5)
Eosinophils Relative: 1 %
HCT: 14.4 % — ABNORMAL LOW (ref 36.0–46.0)
Hemoglobin: 5.1 g/dL — CL (ref 12.0–15.0)
Immature Granulocytes: 1 %
Lymphocytes Relative: 6 %
Lymphs Abs: 0.3 10*3/uL — ABNORMAL LOW (ref 0.7–4.0)
MCH: 33.6 pg (ref 26.0–34.0)
MCHC: 35.4 g/dL (ref 30.0–36.0)
MCV: 94.7 fL (ref 80.0–100.0)
Monocytes Absolute: 0.8 10*3/uL (ref 0.1–1.0)
Monocytes Relative: 15 %
Neutro Abs: 4.1 10*3/uL (ref 1.7–7.7)
Neutrophils Relative %: 77 %
Platelets: 307 10*3/uL (ref 150–400)
RBC: 1.52 MIL/uL — ABNORMAL LOW (ref 3.87–5.11)
RDW: 14.6 % (ref 11.5–15.5)
WBC: 5.3 10*3/uL (ref 4.0–10.5)
nRBC: 0 % (ref 0.0–0.2)

## 2020-11-21 LAB — HEPATIC FUNCTION PANEL
ALT: 19 U/L (ref 0–44)
AST: 17 U/L (ref 15–41)
Albumin: 3.5 g/dL (ref 3.5–5.0)
Alkaline Phosphatase: 107 U/L (ref 38–126)
Bilirubin, Direct: <0.1 mg/dL (ref 0.0–0.2)
Total Bilirubin: 1 mg/dL (ref 0.3–1.2)
Total Protein: 6.5 g/dL (ref 6.5–8.1)

## 2020-11-21 LAB — TECHNOLOGIST SMEAR REVIEW: Plt Morphology: NORMAL

## 2020-11-21 LAB — RETICULOCYTES
Immature Retic Fract: 4.1 % (ref 2.3–15.9)
RBC.: 1.45 MIL/uL — ABNORMAL LOW (ref 3.87–5.11)
Retic Count, Absolute: 6.8 10*3/uL — ABNORMAL LOW (ref 19.0–186.0)
Retic Ct Pct: 0.5 % (ref 0.4–3.1)

## 2020-11-21 LAB — HEMOGLOBIN AND HEMATOCRIT, BLOOD
HCT: 22 % — ABNORMAL LOW (ref 36.0–46.0)
Hemoglobin: 7.7 g/dL — ABNORMAL LOW (ref 12.0–15.0)

## 2020-11-21 LAB — IRON AND TIBC
Iron: 135 ug/dL (ref 28–170)
Saturation Ratios: 74 % — ABNORMAL HIGH (ref 10.4–31.8)
TIBC: 183 ug/dL — ABNORMAL LOW (ref 250–450)
UIBC: 48 ug/dL

## 2020-11-21 LAB — PREPARE RBC (CROSSMATCH)

## 2020-11-21 LAB — FOLATE: Folate: 3.7 ng/mL — ABNORMAL LOW (ref >5.9)

## 2020-11-21 LAB — I-STAT BETA HCG BLOOD, ED (MC, WL, AP ONLY): I-stat hCG, quantitative: <5 m[IU]/mL (ref <5)

## 2020-11-21 LAB — PROTIME-INR
INR: 1.3 — ABNORMAL HIGH (ref 0.8–1.2)
Prothrombin Time: 16.1 seconds — ABNORMAL HIGH (ref 11.4–15.2)

## 2020-11-21 LAB — ABO/RH: ABO/RH(D): B POS

## 2020-11-21 LAB — VITAMIN B12: Vitamin B-12: 213 pg/mL (ref 180–914)

## 2020-11-21 LAB — SAVE SMEAR(SSMR), FOR PROVIDER SLIDE REVIEW

## 2020-11-21 LAB — SARS CORONAVIRUS 2 (TAT 6-24 HRS): SARS Coronavirus 2: NEGATIVE

## 2020-11-21 LAB — FERRITIN: Ferritin: 556 ng/mL — ABNORMAL HIGH (ref 11–307)

## 2020-11-21 MED ORDER — GABAPENTIN 400 MG PO CAPS
800.0000 mg | ORAL_CAPSULE | Freq: Four times a day (QID) | ORAL | Status: DC
  Administered 2020-11-21 – 2020-11-27 (×24): 800 mg via ORAL
  Filled 2020-11-21 (×22): qty 2
  Filled 2020-11-21: qty 8
  Filled 2020-11-21 (×6): qty 2

## 2020-11-21 MED ORDER — NORETHINDRONE 0.35 MG PO TABS: 1.0000 | ORAL_TABLET | Freq: Every day | ORAL | Status: DC

## 2020-11-21 MED ORDER — AMITRIPTYLINE HCL 25 MG PO TABS
25.0000 mg | ORAL_TABLET | Freq: Every day | ORAL | Status: DC
  Administered 2020-11-22 – 2020-11-26 (×5): 50 mg via ORAL
  Filled 2020-11-21 (×7): qty 2

## 2020-11-21 MED ORDER — SUCRALFATE 1 G PO TABS
1.0000 g | ORAL_TABLET | Freq: Three times a day (TID) | ORAL | Status: DC
  Administered 2020-11-21 – 2020-11-27 (×24): 1 g via ORAL
  Filled 2020-11-21 (×24): qty 1

## 2020-11-21 MED ORDER — OZANIMOD HCL 0.92 MG PO CAPS
0.9200 mg | ORAL_CAPSULE | Freq: Every day | ORAL | Status: DC
  Administered 2020-11-25 – 2020-11-27 (×3): 0.92 mg via ORAL
  Filled 2020-11-21 (×4): qty 1

## 2020-11-21 MED ORDER — ACETAMINOPHEN 650 MG RE SUPP: 650.0000 mg | Freq: Four times a day (QID) | RECTAL | Status: DC | PRN

## 2020-11-21 MED ORDER — SODIUM CHLORIDE 0.9% IV SOLUTION: Freq: Once | INTRAVENOUS | Status: AC

## 2020-11-21 MED ORDER — AMPHETAMINE-DEXTROAMPHET ER 10 MG PO CP24
20.0000 mg | ORAL_CAPSULE | Freq: Every day | ORAL | Status: DC
  Administered 2020-11-22 – 2020-11-27 (×6): 20 mg via ORAL
  Filled 2020-11-21 (×6): qty 2
  Filled 2020-11-21: qty 1

## 2020-11-21 MED ORDER — GADOBUTROL 1 MMOL/ML IV SOLN
10.0000 mL | Freq: Once | INTRAVENOUS | Status: AC | PRN
  Administered 2020-11-21: 10 mL via INTRAVENOUS

## 2020-11-21 MED ORDER — PANTOPRAZOLE SODIUM 40 MG PO TBEC
40.0000 mg | DELAYED_RELEASE_TABLET | Freq: Every day | ORAL | Status: DC
  Administered 2020-11-21 – 2020-11-24 (×4): 40 mg via ORAL
  Filled 2020-11-21 (×4): qty 1

## 2020-11-21 MED ORDER — OXYCODONE-ACETAMINOPHEN 10-325 MG PO TABS: 1.0000 | ORAL_TABLET | ORAL | Status: DC

## 2020-11-21 MED ORDER — AMANTADINE HCL 100 MG PO CAPS
100.0000 mg | ORAL_CAPSULE | Freq: Two times a day (BID) | ORAL | Status: DC
  Administered 2020-11-21 – 2020-11-27 (×13): 100 mg via ORAL
  Filled 2020-11-21 (×15): qty 1

## 2020-11-21 MED ORDER — TIZANIDINE HCL 4 MG PO TABS
4.0000 mg | ORAL_TABLET | Freq: Every day | ORAL | Status: DC
  Administered 2020-11-21 – 2020-11-26 (×6): 4 mg via ORAL
  Filled 2020-11-21 (×6): qty 1

## 2020-11-21 MED ORDER — SODIUM CHLORIDE 0.9% FLUSH
3.0000 mL | Freq: Two times a day (BID) | INTRAVENOUS | Status: DC
  Administered 2020-11-21 – 2020-11-26 (×10): 3 mL via INTRAVENOUS

## 2020-11-21 MED ORDER — BUSPIRONE HCL 5 MG PO TABS
15.0000 mg | ORAL_TABLET | Freq: Two times a day (BID) | ORAL | Status: DC
  Administered 2020-11-21 – 2020-11-27 (×13): 15 mg via ORAL
  Filled 2020-11-21 (×3): qty 3
  Filled 2020-11-21: qty 2
  Filled 2020-11-21 (×5): qty 3
  Filled 2020-11-21: qty 2
  Filled 2020-11-21 (×2): qty 3
  Filled 2020-11-21: qty 2

## 2020-11-21 MED ORDER — APIXABAN 5 MG PO TABS
5.0000 mg | ORAL_TABLET | Freq: Two times a day (BID) | ORAL | Status: DC
  Administered 2020-11-21 – 2020-11-27 (×13): 5 mg via ORAL
  Filled 2020-11-21 (×13): qty 1

## 2020-11-21 MED ORDER — LORAZEPAM 1 MG PO TABS
1.0000 mg | ORAL_TABLET | Freq: Once | ORAL | Status: AC
  Administered 2020-11-21: 1 mg via ORAL
  Filled 2020-11-21: qty 1

## 2020-11-21 MED ORDER — OXYCODONE HCL 5 MG PO TABS
5.0000 mg | ORAL_TABLET | Freq: Four times a day (QID) | ORAL | Status: DC
Start: 2020-11-21 — End: 2020-11-27
  Administered 2020-11-21 – 2020-11-27 (×25): 5 mg via ORAL
  Filled 2020-11-21 (×25): qty 1

## 2020-11-21 MED ORDER — ACETAMINOPHEN 325 MG PO TABS: 650.0000 mg | ORAL_TABLET | Freq: Four times a day (QID) | ORAL | Status: DC | PRN

## 2020-11-21 MED ORDER — AMLODIPINE BESYLATE 5 MG PO TABS
5.0000 mg | ORAL_TABLET | Freq: Every day | ORAL | Status: DC
  Administered 2020-11-21 – 2020-11-27 (×6): 5 mg via ORAL
  Filled 2020-11-21 (×7): qty 1

## 2020-11-21 MED ORDER — CITALOPRAM HYDROBROMIDE 20 MG PO TABS
20.0000 mg | ORAL_TABLET | Freq: Every day | ORAL | Status: DC
  Administered 2020-11-21 – 2020-11-27 (×7): 20 mg via ORAL
  Filled 2020-11-21: qty 1
  Filled 2020-11-21: qty 2
  Filled 2020-11-21 (×2): qty 1
  Filled 2020-11-21: qty 2
  Filled 2020-11-21 (×2): qty 1

## 2020-11-21 MED ORDER — METOPROLOL SUCCINATE ER 50 MG PO TB24
50.0000 mg | ORAL_TABLET | Freq: Every day | ORAL | Status: DC
  Administered 2020-11-21 – 2020-11-27 (×6): 50 mg via ORAL
  Filled 2020-11-21: qty 1
  Filled 2020-11-21: qty 2
  Filled 2020-11-21: qty 1
  Filled 2020-11-21: qty 2
  Filled 2020-11-21 (×2): qty 1

## 2020-11-21 MED ORDER — BACLOFEN 20 MG PO TABS
20.0000 mg | ORAL_TABLET | Freq: Four times a day (QID) | ORAL | Status: DC | PRN
  Filled 2020-11-21: qty 1

## 2020-11-21 MED ORDER — OXYCODONE-ACETAMINOPHEN 5-325 MG PO TABS
1.0000 | ORAL_TABLET | Freq: Four times a day (QID) | ORAL | Status: DC
Start: 2020-11-21 — End: 2020-11-27
  Administered 2020-11-21 – 2020-11-27 (×24): 1 via ORAL
  Filled 2020-11-21 (×24): qty 1

## 2020-11-21 MED ORDER — DALFAMPRIDINE ER 10 MG PO TB12
10.0000 mg | ORAL_TABLET | Freq: Two times a day (BID) | ORAL | Status: DC
  Administered 2020-11-23 – 2020-11-27 (×8): 10 mg via ORAL
  Filled 2020-11-21 (×7): qty 1

## 2020-11-21 NOTE — ED Notes (Signed)
Pt returned from CT °

## 2020-11-21 NOTE — ED Notes (Signed)
Pt ambulated to BR with X 1 assist. Tolerated well.

## 2020-11-21 NOTE — ED Notes (Signed)
Breakfast tray ordered per pt request at this time.

## 2020-11-21 NOTE — ED Notes (Signed)
Pt transported to MRI 

## 2020-11-21 NOTE — ED Notes (Signed)
Blood consent signed and witnessed at bedside.

## 2020-11-21 NOTE — Progress Notes (Signed)
OT Cancellation Note  Patient Details Name: Netra Vessels MRN: [id number] DOB: [birth year]   Cancelled Treatment:    Reason Eval/Treat Not Completed: Medical issues which prohibited therapy. Hbg. 5.1.  will reattempt.  Nilsa Nutting., OTR/L Acute Rehabilitation Services Pager [phone] Office [phone]   Lucille Passy M 11/21/2020, 11:10 AM

## 2020-11-21 NOTE — Progress Notes (Signed)
PT Cancellation Note  Patient Details Name: Dawn Peters MRN: [id number] DOB: September 08, 1980   Cancelled Treatment:    Reason Eval/Treat Not Completed: Medical issues which prohibited therapy Pt with Hgb at 5.1. Will hold until medically appropriate and follow up as schedule allows.   Lou Miner, DPT  Acute Rehabilitation Services  Pager: [phone] Office: [phone]    Rudean Hitt 11/21/2020, 10:51 AM

## 2020-11-21 NOTE — ED Notes (Signed)
Blood bank called this RN and states that they only have one unit of blood that is compatible with pts due to antibodies. If pt needs more than one unit, it will have to be ordered and wont be here until later on today.

## 2020-11-21 NOTE — ED Notes (Signed)
Message sent to pharmacy to send Symmetrel and Gabapentin. Adderall, dalfampridine, Micronor, and Ozanimod HCl not verified yet by pharmacy.

## 2020-11-21 NOTE — ED Notes (Signed)
Received pt, ao x 4, NAD. States back pain is better, same left leg tingling/weakness.

## 2020-11-21 NOTE — H&P (Signed)
Date: 11/21/2020               Patient Name:  Dawn Peters MRN: [id number]  DOB: Jul 22, 1980 Age / Sex: 40 y.o., female   PCP: [name], PA-C         Medical Service: Internal Medicine Teaching Service         Attending Physician: Dr. Aldine Contes, MD    First Contact: [name], MD Pager: [phone]  Second Contact: Rick Duff, MD Pager: [phone]       After Hours (After 5p/  First Contact Pager: [phone]  weekends / holidays): Second Contact Pager: [phone]   SUBJECTIVE  Chief Complaint: left leg weakness, dizziness  History of Present Illness: Dawn Peters is a 40 y.o. female with a pertinent PMH of multiple sclerosis, hypertension history of DVT/PE on Eliquis, and chronic kidney disease, who presents to [facility] with left leg weakness and dizziness.  Ms Dawn Peters reports three days of worsening left leg weakness and dizziness/lightheadedness.  She reports having a fall yesterday at home due to the dizziness and her left knee giving out.  She suspected that this was in the setting of a MS flare.  She reports associated headaches but notes that this is intermittent and consistent with her usual migraines.  She does also note increased fatigue over the past week.  She denies any history of fevers/chills, cough, congestion, shortness of breath, chest pain, abdominal pain, nausea/vomiting, dark stools, or diarrhea. Last menstrual period was two weeks prior. She does have ongoing back pain but reports that this is at baseline.   Patient presented for concerns of an MS flare; MRI brain and cervical spine and thoracic spine negative for signs of acute flare. However, patient noted to have Hb of 5.0 on labs. 2u pRBC ordered and patient admitted for further evaluation and management.    Medications: No current facility-administered medications on file prior to encounter.   Current Outpatient Medications on File Prior to Encounter  Medication Sig  Dispense Refill   acetaminophen (TYLENOL) 500 MG tablet Take 1,000 mg by mouth every 6 (six) hours as needed for moderate pain or headache.     amantadine (SYMMETREL) 100 MG capsule Take 100 mg by mouth 2 (two) times daily.     amitriptyline (ELAVIL) 25 MG tablet TAKE 1 TO 2 TABLETS(25 TO 50 MG) BY MOUTH AT BEDTIME (Patient taking differently: Take 25-50 mg by mouth at bedtime.) 180 tablet 1   amLODipine (NORVASC) 5 MG tablet Take 1 tablet (5 mg total) by mouth daily. (Patient taking differently: Take 5 mg by mouth daily after breakfast.) 90 tablet 0   amphetamine-dextroamphetamine (ADDERALL XR) 20 MG 24 hr capsule Take 1 capsule (20 mg total) by mouth daily. 30 capsule 0   apixaban (ELIQUIS) 5 MG TABS tablet Take 1 tablet (5 mg total) by mouth 2 (two) times daily. 60 tablet 5   baclofen (LIORESAL) 20 MG tablet Take 1 tablet (20 mg total) by mouth 4 (four) times daily as needed for muscle spasms. 360 tablet 3   busPIRone (BUSPAR) 15 MG tablet Take 1 tablet (15 mg total) by mouth 2 (two) times daily. Must keep follow up 09/21/20 for ongoing refills 60 tablet 1   citalopram (CELEXA) 20 MG tablet Take 1 tablet (20 mg total) by mouth daily. 30 tablet 11   dalfampridine 10 MG TB12 Take 1 tablet (10 mg total) by mouth 2 (two) times daily. 60 tablet 11   gabapentin (  NEURONTIN) 800 MG tablet Take 1 tablet (800 mg total) by mouth 4 (four) times daily. 120 tablet 11   metoprolol succinate (TOPROL-XL) 50 MG 24 hr tablet Take 50 mg by mouth daily.     Multiple Vitamin (MULTIVITAMIN WITH MINERALS) TABS tablet Take 1 tablet by mouth daily. 30 tablet 0   norethindrone (MICRONOR) 0.35 MG tablet Take 1 tablet (0.35 mg total) by mouth daily. 1 Package 11   omeprazole (PRILOSEC) 40 MG capsule Take 40 mg by mouth daily.     oxyCODONE-acetaminophen (PERCOCET) 10-325 MG tablet Take 1 tablet by mouth every 4 (four) hours.     Ozanimod HCl (ZEPOSIA) 0.92 MG CAPS TAKE ONE CAPSULE (0.'92MG'$ )  BY MOUTH ONE TIME DAILY (Patient  taking differently: Take 0.92 mg by mouth daily.) 30 capsule 11   pantoprazole (PROTONIX) 20 MG tablet Take 1 tablet every morning at least 30 minutes before first dose of Carafate. (Patient taking differently: Take 20 mg by mouth daily.) 30 tablet 0   sucralfate (CARAFATE) 1 g tablet Take 1 tablet (1 g total) by mouth 4 (four) times daily -  with meals and at bedtime. 40 tablet 0   SUMAtriptan (IMITREX) 100 MG tablet TAKE 1 TABLET BY MOUTH 1 TIME FOR UP TO 1 DOSE AS NEEDED FOR MIGRAINE. MAY REPEAT IN 2 HOURS IF HEADACHE PERSISTS OR RECURS (Patient taking differently: Take 100 mg by mouth daily as needed for migraine.) 10 tablet 5   tamsulosin (FLOMAX) 0.4 MG CAPS capsule TAKE 1 CAPSULE(0.4 MG) BY MOUTH DAILY (Patient taking differently: Take 0.4 mg by mouth daily.) 30 capsule 11   tiZANidine (ZANAFLEX) 4 MG tablet TAKE 1 TABLET BY MOUTH EVERY NIGHT AT BEDTIME (Patient taking differently: Take 4 mg by mouth at bedtime.) 30 tablet 10   methylPREDNISolone (MEDROL DOSEPAK) 4 MG TBPK tablet Take by mouth.  (Patient not taking: Reported on 11/21/2020)     methylPREDNISolone (MEDROL) 4 MG tablet Taper from 6 pills po for one day to 1 pill po the last day over 6 days (Patient not taking: No sig reported) 21 tablet 0   metoprolol succinate (TOPROL-XL) 25 MG 24 hr tablet TAKE 1 TABLET BY MOUTH DAILY (Patient not taking: Reported on 11/21/2020) 30 tablet 5   predniSONE (DELTASONE) 10 MG tablet 6 pills po x 1 day, then 5 pills po x 1 day, then 4 pills po x 1 day, then 3 pills po x 1 day, then 2 pills po x 1 day, then 1 pill po x 1 day (Patient not taking: No sig reported) 21 tablet 0   [DISCONTINUED] Oxcarbazepine (TRILEPTAL) 300 MG tablet Take 1 tablet (300 mg total) by mouth 2 (two) times daily. 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   left lower extremity-- treated with eliquis   Gait disturbance    due to MS   GERD (gastroesophageal reflux disease)    watch diet   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/p  THA   History of DVT of lower extremity 2007   History of encephalopathy [id number]   acute metabolic encephalopathy secondary to MS,  resolved   History of MRSA infection 2004   right hip infection post THA   History of pulmonary embolus (PE) 2005   Hydronephrosis, left    w/ acute kidney injury 02/ 2019   Hypertension    Left-sided weakness    due to MS   Migraines    MS (multiple sclerosis) [facility]) neurologist-  dr Renne Musca   dx 2000--   Muscle spasticity    Neurogenic bladder    due to MS   Pulmonary embolism, bilateral ([place]) 09/17/2017   treated w/ eliquis   Strains to urinate    Urgency of urination    Social:  Patient lives at home with her mother, brother, nephew.  She has one 78 year old son.  She does not currently work.  She uses a cane to assist with ambulation.  She denies any history of tobacco use, alcohol use, illicit substance use.   Family History: Family History  Problem Relation Age of Onset   Healthy Mother    Healthy Father    Breast cancer Other        paternal grandmother dx age 38   Colon cancer Other        paternal grandmother   Hypertension Other    Diabetes Other     Allergies: Allergies as of 11/20/2020 - Review Complete 11/20/2020  Allergen Reaction Noted   Ace inhibitors Swelling 12/29/2016   Amoxicillin Itching 11/24/2017   Celexa [citalopram] Swelling 07/09/2018   Lisinopril Swelling 12/29/2016    Review of Systems: A complete ROS was negative except as per HPI.   OBJECTIVE:  Physical Exam: Blood pressure 134/90, pulse (!) 107, temperature 98.8 F (37.1 C), temperature source Oral, resp. rate 19, height 5'  6" (1.676 m), weight 102.1 kg, SpO2 100 %. Physical Exam Constitutional:      General: She is not in acute distress.    Appearance: Normal appearance. She is not ill-appearing or diaphoretic.  HENT:     Head: Normocephalic and atraumatic.     Mouth/Throat:     Mouth: Mucous membranes are moist.     Pharynx: Oropharynx is clear.  Eyes:     Extraocular Movements: Extraocular movements intact.     Conjunctiva/sclera: Conjunctivae normal.  Cardiovascular:     Rate and Rhythm: Regular rhythm. Tachycardia present.     Pulses: Normal pulses.     Heart sounds: Normal heart sounds. No murmur heard.   No friction rub. No gallop.  Pulmonary:     Effort: Pulmonary effort is normal.     Breath sounds: Normal breath sounds. No stridor. No wheezing, rhonchi or rales.  Abdominal:     General: Bowel sounds are normal.     Palpations: Abdomen is soft.     Tenderness: There is no abdominal tenderness. There is no guarding or rebound.  Musculoskeletal:        General: No swelling or tenderness. Normal range of motion.     Cervical back: Normal range of motion and neck supple.  Skin:    General: Skin is warm and dry.     Capillary Refill: Capillary refill takes less than 2 seconds.     Findings: No erythema, lesion or rash.  Neurological:     Mental Status: She is alert and oriented to person, place, and time. Mental status is at baseline.     Cranial Nerves: No cranial nerve deficit.     Sensory: No sensory deficit.     Motor: Weakness present.  Coordination: Coordination normal.     Comments: LLE 4/5 strength, RLE and BUE 5/5 strength  Psychiatric:        Mood and Affect: Mood normal.        Behavior: Behavior normal.    Pertinent Labs: CBC    Component Value Date/Time   WBC 5.3 11/21/2020 0120   RBC 1.45 (L) 11/21/2020 0522   RBC 1.52 (L) 11/21/2020 0120   HGB 5.1 (LL) 11/21/2020 0120   HGB 13.8 02/19/2019 1710   HCT 14.4 (L) 11/21/2020 0120   HCT 40.0 02/19/2019 1710   PLT  307 11/21/2020 0120   PLT 332 02/19/2019 1710   MCV 94.7 11/21/2020 0120   MCV 93 02/19/2019 1710   MCH 33.6 11/21/2020 0120   MCHC 35.4 11/21/2020 0120   RDW 14.6 11/21/2020 0120   RDW 13.9 02/19/2019 1710   LYMPHSABS 0.3 (L) 11/21/2020 0120   LYMPHSABS 0.9 02/19/2019 1710   MONOABS 0.8 11/21/2020 0120   EOSABS 0.0 11/21/2020 0120   EOSABS 0.0 02/19/2019 1710   BASOSABS 0.0 11/21/2020 0120   BASOSABS 0.1 02/19/2019 1710     CMP     Component Value Date/Time   NA 140 11/20/2020 1952   NA 144 06/21/2017 1626   K 3.6 11/20/2020 1952   CL 104 11/20/2020 1952   CO2 26 11/20/2020 1952   GLUCOSE 92 11/20/2020 1952   BUN 15 11/20/2020 1952   BUN 16 06/21/2017 1626   CREATININE 1.36 (H) 11/20/2020 1952   CREATININE 0.85 07/03/2014 0959   CALCIUM 9.4 11/20/2020 1952   PROT 6.5 11/20/2020 2340   PROT 7.5 01/11/2018 1542   ALBUMIN 3.5 11/20/2020 2340   ALBUMIN 4.2 01/11/2018 1542   AST 17 11/20/2020 2340   ALT 19 11/20/2020 2340   ALKPHOS 107 11/20/2020 2340   BILITOT 1.0 11/20/2020 2340   BILITOT 0.5 01/11/2018 1542   GFRNONAA 50 (L) 11/20/2020 1952   GFRNONAA 88 07/03/2014 0959   GFRAA 45 (L) 09/01/2019 0503   GFRAA >89 07/03/2014 0959    Pertinent Imaging: MR Brain W and Wo Contrast  Result Date: 11/21/2020 CLINICAL DATA:  Multiple sclerosis, new event EXAM: MRI HEAD WITHOUT AND WITH CONTRAST TECHNIQUE: Multiplanar, multiecho pulse sequences of the brain and surrounding structures were obtained without and with intravenous contrast. CONTRAST:  12m GADAVIST GADOBUTROL 1 MMOL/ML IV SOLN COMPARISON:  09/01/2019 FINDINGS: Brain: Chronic white matter disease with periventricular predilection and mild hazy involvement around the fourth ventricle. No progression, restricted diffusion, enhancement, infarct, or mass. Vascular: Normal flow voids and vessel enhancements Skull and upper cervical spine: Normal marrow signal Sinuses/Orbits: Negative IMPRESSION: Multiple sclerosis with  stable extent from June 2021. No evidence of active demyelination. Electronically Signed   By: JJorje GuildM.D.   On: 11/21/2020 04:50   MR CERVICAL SPINE W WO CONTRAST  Result Date: 11/21/2020 CLINICAL DATA:  Multiple sclerosis, new event EXAM: MRI CERVICAL AND THORACIC SPINE WITHOUT AND WITH CONTRAST TECHNIQUE: Multiplanar and multiecho pulse sequences of the cervical spine, to include the craniocervical junction and cervicothoracic junction, and the thoracic spine, were obtained without and with intravenous contrast. CONTRAST:  127mGADAVIST GADOBUTROL 1 MMOL/ML IV SOLN COMPARISON:  Cervical MRI 09/01/2019 03/13/2020 FINDINGS: MRI CERVICAL SPINE FINDINGS Alignment: Normal Vertebrae: More hypointense marrow diffusely than on prior, usually from anemia. Cord: Stable appearance with no discrete plaque-like signal abnormality. No cord swelling or enhancement. Posterior Fossa, vertebral arteries, paraspinal tissues: Posterior fossa reported separately. No perispinal mass or inflammation. Disc  levels: Diffusely preserved disc height and hydration. Negative facets. No neural impingement. MRI THORACIC SPINE FINDINGS Alignment:  Exaggerated thoracic kyphosis without listhesis. Vertebrae: Diffusely hypointense marrow signal on T1 weighted imaging. No focal lesion Cord: Allowing for intermittent motion artifact, no cord plaque or swelling is seen. Paraspinal and other soft tissues: Known, chronic severe left hydro nephrosis and upper hydroureter with cortical thinning that is marked. T2 hyperintensity in the right liver with hepatic cysts by prior CT. Disc levels: No herniation or impingement IMPRESSION: 1. Stable cervical and thoracic cord. No evidence of active demyelination. 2. Diffusely low marrow signal since 2021, please correlate with CBC. 3. Intermittent motion artifact. Electronically Signed   By: Jorje Guild M.D.   On: 11/21/2020 04:45   MR THORACIC SPINE W WO CONTRAST  Result Date:  11/21/2020 CLINICAL DATA:  Multiple sclerosis, new event EXAM: MRI CERVICAL AND THORACIC SPINE WITHOUT AND WITH CONTRAST TECHNIQUE: Multiplanar and multiecho pulse sequences of the cervical spine, to include the craniocervical junction and cervicothoracic junction, and the thoracic spine, were obtained without and with intravenous contrast. CONTRAST:  52m GADAVIST GADOBUTROL 1 MMOL/ML IV SOLN COMPARISON:  Cervical MRI 09/01/2019 03/13/2020 FINDINGS: MRI CERVICAL SPINE FINDINGS Alignment: Normal Vertebrae: More hypointense marrow diffusely than on prior, usually from anemia. Cord: Stable appearance with no discrete plaque-like signal abnormality. No cord swelling or enhancement. Posterior Fossa, vertebral arteries, paraspinal tissues: Posterior fossa reported separately. No perispinal mass or inflammation. Disc levels: Diffusely preserved disc height and hydration. Negative facets. No neural impingement. MRI THORACIC SPINE FINDINGS Alignment:  Exaggerated thoracic kyphosis without listhesis. Vertebrae: Diffusely hypointense marrow signal on T1 weighted imaging. No focal lesion Cord: Allowing for intermittent motion artifact, no cord plaque or swelling is seen. Paraspinal and other soft tissues: Known, chronic severe left hydro nephrosis and upper hydroureter with cortical thinning that is marked. T2 hyperintensity in the right liver with hepatic cysts by prior CT. Disc levels: No herniation or impingement IMPRESSION: 1. Stable cervical and thoracic cord. No evidence of active demyelination. 2. Diffusely low marrow signal since 2021, please correlate with CBC. 3. Intermittent motion artifact. Electronically Signed   By: JJorje GuildM.D.   On: 11/21/2020 04:45   CT Renal Stone Study  Result Date: 11/21/2020 CLINICAL DATA:  Flank pain with kidney stone suspected EXAM: CT ABDOMEN AND PELVIS WITHOUT CONTRAST TECHNIQUE: Multidetector CT imaging of the abdomen and pelvis was performed following the standard  protocol without IV contrast. COMPARISON:  07/19/2019 FINDINGS: Lower chest:  No contributory findings. Hepatobiliary: Small hepatic cystic densities.No evidence of biliary obstruction or stone. Pancreas: Unremarkable. Spleen: Unremarkable. Adrenals/Urinary Tract: Negative adrenals. Marked left hydronephrosis and upper hydroureter with cortical thinning. High-density in the upper pole right kidney and bladder attributed to excreting gadolinium. Unremarkable bladder. Stomach/Bowel: No obstruction. No bowel wall thickening. Diffuse colonic stool. Vascular/Lymphatic: No acute vascular abnormality. No mass or adenopathy. Reproductive:No pathologic findings. 4.1 cm cystic density in the right ovary, likely follicular based on patient age. Other: No ascites or pneumoperitoneum. Musculoskeletal: No acute abnormalities. Bilateral hip arthroplasty with iliopsoas atrophy. IMPRESSION: 1. No acute finding. 2. 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 3. Generalized colonic stool, please correlate for constipation. Electronically Signed   By: JJorje GuildM.D.   On: 11/21/2020 08:24    EKG: personally reviewed my interpretation is pending.   ASSESSMENT & PLAN:  Assessment: Active Problems:   Symptomatic anemia   Levaeh MRylyn Twomeyis a 40y.o. with pertinent PMH of multiple sclerosis, DVT/PE  on Eliquis, hypertension, chronic renal disease who presented with left leg weakness and dizziness and admit for symptomatic anemia on hospital day 0  Plan: #Symptomatic hypoproliferative anemia Patient reports one week of generalized malaise with three days of intermittent headaches and lightheadedness/dizziness. She was noted to have a hemoglobin of 5.0 on admission without evidence of acute bleed or hemolysis. Labs consistent with hypoproliferative anemia. Patient received 2u pRBC in the ED. Patient denies any recent illness, making viral etiology less likely. Beta-hCG also negative,  making pregnancy less likely cause of patient's anemia. Diamond-Blackfan anemia less likely as no prior history of anemia noted in patient and no other congenital abnormalities reported. Other differentials for red cell aplasia include autoimmune disorders, lymphocytic or plasma cell disorders, sideroblastic anemia although patient has not been on any medications that can cause this and not at risk for copper deficiency. Anti-ESA antibodies less likely as patient is not currently receiving EPO stimulation for her chronic renal disease.  Given her history of DVT/PE in the past and presence of warm antibodies on type and screen for blood transfusion despite no prior history of blood transfusions, autoimmune condition is more likely.  - F/u post transfusion CBC - Peripheral smear review  - SPEP/UPEP/IFE, Kappa/lambda light chains  - ANCA, Rheumatoid factor  - Trend CBC and monitor for any signs of bleeding   #Left lower extremity weakness #Multiple sclerosis  Patient has a history of MS for which she follows with Dr Felecia Shelling; last OV 09/21/20. She reports that her left side is mostly affected by MS. She initially presented with concerns of worsening left lower extremity weakness and a mechanical fall and was concerned of possible MS flare. MRI Brain, cervical and thoracic spine negative for acute demyelination at this time. On examination, strength in LLE slightly diminished compared to RLE.  - PT/OT evaluation - Continue Zeposia daily, gabapentin qid, dalfampridine twice daily, zanaflex nightly, baclofen qid, amitriptyline nightly and amantadine twice daily   #Hypertension Patient has a history of hypertension for which she is on amlodipine and metoprolol. She reports compliance with her medications. She is normotensive at this time. No evidence of active bleed.  - Continue amlodipine '10mg'$  daily  - Continue metoprolol '50mg'$  daily   #CKD Stage IIIa Patient has a known history of chronic renal disease.  Serum creatinine appears to be at baseline at this time. Iron studies consistent with anemia of chronic disease, likely in setting of renal disease. - Continue to monitor renal function - Avoid nephrotoxic agents    #Hx of DVT/PE  Patient is on Eliquis daily for a history of bilateral DVT and one PE in 2014. She reports compliance with medications and has not missed any doses. Without evidence of ongoing bleed being cause of patient's anemia, can continue with home dose of Eliquis. - Continue Eliquis '5mg'$  bid   #Chronic back pain - Continue percocet 10-'325mg'$  q4h   Best Practice: Diet: Cardiac diet IVF: Fluids: None, Rate: None VTE: Eliquis Code: Full AB: None Status: Observation with expected length of stay less than 2 midnights. Anticipated Discharge Location: Home Barriers to Discharge: Medical stability  Signature: Harvie Heck, MD Internal Medicine Resident, PGY-3 [facility]  Pager: [phone] 9:32 AM, 11/21/2020   Please contact the on call pager after 5 pm and on weekends at [phone].

## 2020-11-21 NOTE — ED Notes (Signed)
Patient transported to CT; Legrand Como RN transported pt to CT 1 d/t pt getting blood. Will continue to monitor.

## 2020-11-21 NOTE — ED Notes (Addendum)
IV team here to attempt PIV.  Note created in error. IV team is not here for this pt.

## 2020-11-22 DIAGNOSIS — Z79899 Other long term (current) drug therapy: Secondary | ICD-10-CM

## 2020-11-22 DIAGNOSIS — D649 Anemia, unspecified: Secondary | ICD-10-CM | POA: Diagnosis not present

## 2020-11-22 DIAGNOSIS — G35 Multiple sclerosis: Secondary | ICD-10-CM | POA: Diagnosis not present

## 2020-11-22 DIAGNOSIS — Z86718 Personal history of other venous thrombosis and embolism: Secondary | ICD-10-CM

## 2020-11-22 DIAGNOSIS — N189 Chronic kidney disease, unspecified: Secondary | ICD-10-CM | POA: Diagnosis not present

## 2020-11-22 LAB — CBC WITH DIFFERENTIAL/PLATELET
Abs Immature Granulocytes: 0.03 10*3/uL (ref 0.00–0.07)
Basophils Absolute: 0 10*3/uL (ref 0.0–0.1)
Basophils Relative: 0 %
Eosinophils Absolute: 0.1 10*3/uL (ref 0.0–0.5)
Eosinophils Relative: 2 %
HCT: 22.9 % — ABNORMAL LOW (ref 36.0–46.0)
Hemoglobin: 8.1 g/dL — ABNORMAL LOW (ref 12.0–15.0)
Immature Granulocytes: 1 %
Lymphocytes Relative: 10 %
Lymphs Abs: 0.3 10*3/uL — ABNORMAL LOW (ref 0.7–4.0)
MCH: 32.4 pg (ref 26.0–34.0)
MCHC: 35.4 g/dL (ref 30.0–36.0)
MCV: 91.6 fL (ref 80.0–100.0)
Monocytes Absolute: 0.5 10*3/uL (ref 0.1–1.0)
Monocytes Relative: 14 %
Neutro Abs: 2.3 10*3/uL (ref 1.7–7.7)
Neutrophils Relative %: 73 %
Platelets: 196 10*3/uL (ref 150–400)
RBC: 2.5 MIL/uL — ABNORMAL LOW (ref 3.87–5.11)
RDW: 14.7 % (ref 11.5–15.5)
WBC: 3.2 10*3/uL — ABNORMAL LOW (ref 4.0–10.5)
nRBC: 0 % (ref 0.0–0.2)

## 2020-11-22 LAB — BASIC METABOLIC PANEL
Anion gap: 7 (ref 5–15)
BUN: 15 mg/dL (ref 6–20)
CO2: 27 mmol/L (ref 22–32)
Calcium: 8.6 mg/dL — ABNORMAL LOW (ref 8.9–10.3)
Chloride: 103 mmol/L (ref 98–111)
Creatinine, Ser: 1.46 mg/dL — ABNORMAL HIGH (ref 0.44–1.00)
GFR, Estimated: 46 mL/min — ABNORMAL LOW (ref >60)
Glucose, Bld: 107 mg/dL — ABNORMAL HIGH (ref 70–99)
Potassium: 3.9 mmol/L (ref 3.5–5.1)
Sodium: 137 mmol/L (ref 135–145)

## 2020-11-22 LAB — CBC
HCT: 23.5 % — ABNORMAL LOW (ref 36.0–46.0)
Hemoglobin: 7.9 g/dL — ABNORMAL LOW (ref 12.0–15.0)
MCH: 31.3 pg (ref 26.0–34.0)
MCHC: 33.6 g/dL (ref 30.0–36.0)
MCV: 93.3 fL (ref 80.0–100.0)
Platelets: 250 10*3/uL (ref 150–400)
RBC: 2.52 MIL/uL — ABNORMAL LOW (ref 3.87–5.11)
RDW: 15 % (ref 11.5–15.5)
WBC: 3.1 10*3/uL — ABNORMAL LOW (ref 4.0–10.5)
nRBC: 0 % (ref 0.0–0.2)

## 2020-11-22 LAB — HIV ANTIBODY (ROUTINE TESTING W REFLEX): HIV Screen 4th Generation wRfx: NONREACTIVE

## 2020-11-22 MED ORDER — DIPHENHYDRAMINE HCL 25 MG PO CAPS
50.0000 mg | ORAL_CAPSULE | Freq: Every evening | ORAL | Status: DC | PRN
  Administered 2020-11-22 – 2020-11-23 (×2): 50 mg via ORAL
  Filled 2020-11-22 (×2): qty 2

## 2020-11-22 MED ORDER — SODIUM CHLORIDE 0.9 % IV SOLN
1.0000 mg | Freq: Every day | INTRAVENOUS | Status: AC
  Administered 2020-11-22 – 2020-11-24 (×3): 1 mg via INTRAVENOUS
  Filled 2020-11-22 (×3): qty 0.2

## 2020-11-22 NOTE — Evaluation (Signed)
Physical Therapy Evaluation Patient Details Name: Dawn Peters MRN: [id number] DOB: 08/20/1980 Today's Date: 11/22/2020  History of Present Illness  40 y.o. female presented to [facility] 11/21/20 with left leg weakness, dizziness, and fall. MRI brain, spine negative for acute MS flare. Hgb 5.0; given 2U PRBC with Hgb 7.7   PMH of multiple sclerosis, hypertension history of DVT/PE on Eliquis, and chronic kidney disease  Clinical Impression   Pt admitted secondary to problem above with deficits below. PTA patient was ambulatory without a device most of the time--occasional use of cane.  Pt currently requires min assist to ambulate 120 ft with RW and was extremely shaky and weak with left foot drop/dragging LLE by the time she sat down. Pt reports this is how she gets when she has MS flare up and typically gets IV steroids and improves. She does not feel she should go home in current condition as she feels she will fall and have to come back to ED.  If pt discharges home, she will need a RW as her current one is broken. Anticipate patient will benefit from PT to address problems listed below.Will continue to follow acutely to maximize functional mobility independence and safety.          Recommendations for follow up therapy are one component of a multi-disciplinary discharge planning process, led by the attending physician.  Recommendations may be updated based on patient status, additional functional criteria and insurance authorization.  Follow Up Recommendations Outpatient PT    Equipment Recommendations  Rolling walker with 5" wheels (handle on current walker is broken)    Recommendations for Other Services OT consult     Precautions / Restrictions Precautions Precautions: Fall Precaution Comments: reports previous falls, but has been awhile      Mobility  Bed Mobility Overal bed mobility: Modified Independent             General bed mobility comments: HOB elevated for  supine to sit; pt assists LLE up onto bed with bil UEs (as she has done PTA)    Transfers Overall transfer level: Needs assistance Equipment used: Rolling walker (2 wheeled) Transfers: Sit to/from Stand Sit to Stand: Supervision         General transfer comment: for safety due to recent fall and monitoring for dizziness; stood from toilet with grab bar  Ambulation/Gait Ambulation/Gait assistance: Min assist Gait Distance (Feet): 120 Feet (toileted; 25 ft) Assistive device: Rolling walker (2 wheeled) Gait Pattern/deviations: Step-through pattern;Decreased dorsiflexion - left;Steppage Gait velocity: decreased Gait velocity interpretation: 1.31 - 2.62 ft/sec, indicative of limited community ambulator General Gait Details: pt normally uses brace for left foot drop (not here) with occasional toe catching/dragging (especially as she fatigued and not lifting left foot for clearance); very shakey through left extremities; poor judgement on how far she could safely walk as she was very shakey/unsafe when she made it back to bathroom; vc for safe use of RW at sink (pt tends to push it aside 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)       Balance Overall balance assessment: Needs assistance Sitting-balance support: No upper extremity supported;Feet unsupported Sitting balance-Leahy Scale: Good     Standing balance support: Bilateral upper extremity supported Standing balance-Leahy Scale: Poor Standing balance comment: requires bil UE support in standing; LLE shaking  Pertinent Vitals/Pain Pain Assessment: 0-10 Pain Score: 8  Pain Location: back, left side (chronic) Pain Descriptors / Indicators: Aching Pain Intervention(s): Limited activity within patient's tolerance    Home Living Family/patient expects to be discharged to:: Private residence Living Arrangements: Parent;Other  relatives (mother and brother) Available Help at Discharge: Family;Available PRN/intermittently Type of Home: House Home Access: Level entry     Home Layout: Multi-level;Able to live on main level with bedroom/bathroom Home Equipment: Gilford Rile - 2 wheels;Cane - single point      Prior Function Level of Independence: Needs assistance   Gait / Transfers Assistance Needed: walks independently; occ use of cane vs walker  ADL's / Homemaking Assistance Needed: assist with groceries; does drive; independent with bathing and dressing  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, newspaper   Dominant Hand: Right    Extremity/Trunk Assessment   Upper Extremity Assessment Upper Extremity Assessment: Defer to OT evaluation (LUE weakness chronic due to MS)    Lower Extremity Assessment Lower Extremity Assessment: LLE deficits/detail LLE Deficits / Details: knee extension 3-/5, ankle DF 3-/5 LLE Sensation: decreased light touch LLE Coordination: decreased fine motor    Cervical / Trunk Assessment Cervical / Trunk Assessment: Other exceptions Cervical / Trunk Exceptions: walks in forward flexed posture due to back pain  Communication   Communication: No difficulties  Cognition Arousal/Alertness: Awake/alert Behavior During Therapy: WFL for tasks assessed/performed Overall Cognitive Status: Within Functional Limits for tasks assessed                                        General Comments General comments (skin integrity, edema, etc.): Discussed with pt need to readjust her expectations for how much activity she can currently handle as she was extremely fatigued and unsafe by the end of the walk (pt was instructed repeatedly that she needed to let me know when she needed to turn around to make it back to her bathroom). Discussed followup PT and she prefers to return to OPPT in clinic where she is known.    Exercises     Assessment/Plan    PT Assessment Patient needs continued  PT services  PT Problem List Decreased strength;Decreased activity tolerance;Decreased balance;Decreased mobility;Decreased knowledge of use of DME;Decreased safety awareness;Impaired sensation;Impaired tone;Pain       PT Treatment Interventions DME instruction;Gait training;Functional mobility training;Therapeutic activities;Therapeutic exercise;Balance training;Neuromuscular re-education;Patient/family education    PT Goals (Current goals can be found in the Care Plan section)  Acute Rehab PT Goals Patient Stated Goal: get IV steriods to help her with MS flare, "It's why I came to the hospital" PT Goal Formulation: With patient Time For Goal Achievement: 12/06/20 Potential to Achieve Goals: Good    Frequency Min 3X/week   Barriers to discharge        Co-evaluation               AM-PAC PT "6 Clicks" Mobility  Outcome Measure Help needed turning from your back to your side while in a flat bed without using bedrails?: None Help needed moving from lying on your back to sitting on the side of a flat bed without using bedrails?: None Help needed moving to and from a bed to a chair (including a wheelchair)?: A Little Help needed standing up from a chair using your arms (e.g., wheelchair or bedside chair)?: A Little Help needed to walk in hospital room?: A  Little Help needed climbing 3-5 steps with a railing? : A Lot 6 Click Score: 19    End of Session Equipment Utilized During Treatment: Gait belt Activity Tolerance: Patient limited by fatigue Patient left: in bed;with call bell/phone within reach;with nursing/sitter in room Nurse Communication: Mobility status;Other (comment) (needs to use RW and left one in room) PT Visit Diagnosis: Muscle weakness (generalized) (M62.81);History of falling (Z91.81);Other abnormalities of gait and mobility (R26.89)    Time: [id number] PT Time Calculation (min) (ACUTE ONLY): 37 min   Charges:   PT Evaluation $PT Eval Moderate Complexity:  1 Mod PT Treatments $Gait Training: 8-22 mins         Arby Barrette, PT Pager [phone]   Rexanne Mano 11/22/2020, 10:23 AM

## 2020-11-22 NOTE — Consult Note (Signed)
Reason for the request:    Anemia  HPI: I was asked by Dr. Clyde Canterbury to evaluate Ms. Jutte for the evaluation of anemia.  She is a 40 year old woman with history of MS, DVT and chronic kidney disease presented acutely with lower extremity weakness that she thought related to a multiple sclerosis flareup.  On evaluation she was noted to have a hemoglobin of 5.0 with white cell count of 4.3 and a platelet count of 296.  Repeat CBC confirmed these findings and she subsequently received 2 units of packed red cell transfusion.  Her hemoglobin was up to 7.7 on 11/21/2020.  Her work-up showed decrease her reticulocyte count with absolute reticulocyte count of 6.8.  Her creatinine is 1.46 with a creatinine clearance of 45 cc/min.  Her vitamin B12 level was normal at 161, folic acid slightly depressed at 3.7.  Iron is normal at 135 with ferritin of 556 and increased saturations.  Total protein that was normal at 6.5 with total bili of 1.0.  Clinically, she denies any hematochezia, melena or hemoptysis.  She denies any constitutional symptoms of fevers or chills or sweats.  She has reported improvement in her symptoms after transfusion.  She does not report any headaches, blurry vision, syncope or seizures. Does not report any fevers, chills or sweats.  Does not report any cough, wheezing or hemoptysis.  Does not report any chest pain, palpitation, orthopnea or leg edema.  Does not report any nausea, vomiting or abdominal pain.  Does not report any constipation or diarrhea.  Does not report any skeletal complaints.    Does not report frequency, urgency or hematuria.  Does not report any skin rashes or lesions. Does not report any heat or cold intolerance.  Does not report any lymphadenopathy or petechiae.  Does not report any anxiety or depression.  Remaining review of systems is negative.     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   left lower extremity-- treated with eliquis   Gait disturbance    due to MS   GERD (gastroesophageal reflux disease)    watch diet   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/p  THA   History of DVT of lower extremity 2007   History of encephalopathy 11/6043   acute metabolic encephalopathy secondary to MS,  resolved   History of MRSA infection 2004   right hip infection post THA   History of pulmonary embolus (PE) 2005   Hydronephrosis, left    w/ acute kidney injury 02/ 2019   Hypertension    Left-sided weakness    due to MS   Migraines    MS (multiple sclerosis) [facility]) neurologist-  dr Renne Musca   dx 2000--   Muscle spasticity    Neurogenic bladder    due to MS   Pulmonary embolism, bilateral ([place]) 09/17/2017   treated w/ eliquis   Strains to urinate    Urgency of urination   :   Past Surgical History:  Procedure Laterality Date   CYSTOSCOPY WITH RETROGRADE PYELOGRAM, URETEROSCOPY AND STENT PLACEMENT Bilateral 11/29/2017   Procedure: BILATERAL  RETROGRADE PYELOGRAM, LEFT DIAGNOSIC URETEROSCOPY AND STENT LEFT PLACEMENT;  Surgeon: Ardis Hughs, MD;  Location: [facility];  Service: Urology;  Laterality: Bilateral;   HIP ARTHROSCOPY Left 07-05-2013   dr pill _0    iliopsosas release, synovectomy   REVISION TOTAL HIP ARTHROPLASTY Right 03-28-2003    dr Alvan Dame _1    TOTAL HIP ARTHROPLASTY Bilateral left 11-05-2002  dr Alvan Dame _2 ;   right 06/ 2004  _3   :   Current Facility-Administered Medications:    acetaminophen (TYLENOL) tablet 650 mg, 650 mg, Oral, Q6H PRN **OR** acetaminophen (TYLENOL) suppository 650 mg, 650 mg, Rectal, Q6H PRN, Aslam, Sadia, MD   amantadine (SYMMETREL) capsule 100 mg, 100 mg, Oral, BID, Aslam, Sadia, MD, 100 mg at 11/22/20 1008   amitriptyline (ELAVIL) tablet  25-50 mg, 25-50 mg, Oral, QHS, Aslam, Sadia, MD   amLODipine (NORVASC) tablet 5 mg, 5 mg, Oral, Daily, Aslam, Sadia, MD, 5 mg at 11/22/20 1005   amphetamine-dextroamphetamine (ADDERALL XR) 24 hr capsule 20 mg, 20 mg, Oral, Daily, Aslam, Sadia, MD, 20 mg at 11/22/20 1019   apixaban (ELIQUIS) tablet 5 mg, 5 mg, Oral, BID, Aslam, Sadia, MD, 5 mg at 11/22/20 1006   baclofen (LIORESAL) tablet 20 mg, 20 mg, Oral, QID PRN, Aslam, Sadia, MD   busPIRone (BUSPAR) tablet 15 mg, 15 mg, Oral, BID, Aslam, Sadia, MD, 15 mg at 11/22/20 1008   citalopram (CELEXA) tablet 20 mg, 20 mg, Oral, Daily, Aslam, Sadia, MD, 20 mg at 11/22/20 1007   dalfampridine TB12 10 mg, 10 mg, Oral, BID, Aslam, Sadia, MD   folic acid 1 mg in sodium chloride 0.9 % 50 mL IVPB, 1 mg, Intravenous, Daily, Rick Duff, MD, Last Rate: 100.4 mL/hr at 11/22/20 1125, 1 mg at 11/22/20 1125   gabapentin (NEURONTIN) capsule 800 mg, 800 mg, Oral, QID, Aslam, Sadia, MD, 800 mg at 11/22/20 1005   metoprolol succinate (TOPROL-XL) 24 hr tablet 50 mg, 50 mg, Oral, Daily, Aslam, Sadia, MD, 50 mg at 11/22/20 1007   norethindrone (MICRONOR) 0.35 MG tablet 0.35 mg, 1 tablet, Oral, Daily, Aslam, Sadia, MD   oxyCODONE-acetaminophen (PERCOCET/ROXICET) 5-325 MG per tablet 1 tablet, 1 tablet, Oral, Q6H, 1 tablet at 11/22/20 1006 **AND** oxyCODONE (Oxy IR/ROXICODONE) immediate release tablet 5 mg, 5 mg, Oral, Q6H, Narendra, Nischal, MD, 5 mg at 11/22/20 1007   Ozanimod HCl CAPS 0.92 mg, 0.92 mg, Oral, Daily, Aslam, Sadia, MD   pantoprazole (PROTONIX) EC tablet 40 mg, 40 mg, Oral, Daily, Aslam, Sadia, MD, 40 mg at 11/22/20 1005   sodium chloride flush (NS) 0.9 % injection 3 mL, 3 mL, Intravenous, Q12H, Aslam, Sadia, MD, 3 mL at 11/22/20 1009   sucralfate (CARAFATE) tablet 1 g, 1 g, Oral, TID WC & HS, Aslam, Sadia, MD, 1 g at 11/22/20 1124   tiZANidine (ZANAFLEX) tablet 4 mg, 4 mg, Oral, QHS, Aslam, Sadia, MD, 4 mg at 11/21/20 2235  Current Outpatient  Medications:    acetaminophen (TYLENOL) 500 MG tablet, Take 1,000 mg by mouth every 6 (six) hours as needed for moderate pain or headache., Disp: , Rfl:    amantadine (SYMMETREL) 100 MG capsule, Take 100 mg by mouth 2 (two) times daily., Disp: , Rfl:    amitriptyline (ELAVIL) 25 MG tablet, TAKE 1 TO 2 TABLETS(25 TO 50 MG) BY MOUTH AT BEDTIME (Patient taking differently: Take 25-50 mg by mouth at bedtime.), Disp: 180 tablet, Rfl: 1   amLODipine (NORVASC) 5 MG tablet, Take 1 tablet (5 mg total) by mouth daily. (Patient taking differently: Take 5 mg by mouth daily after breakfast.), Disp: 90 tablet, Rfl: 0   amphetamine-dextroamphetamine (  ADDERALL XR) 20 MG 24 hr capsule, Take 1 capsule (20 mg total) by mouth daily., Disp: 30 capsule, Rfl: 0   apixaban (ELIQUIS) 5 MG TABS tablet, Take 1 tablet (5 mg total) by mouth 2 (two) times daily., Disp: 60 tablet, Rfl: 5   baclofen (LIORESAL) 20 MG tablet, Take 1 tablet (20 mg total) by mouth 4 (four) times daily as needed for muscle spasms., Disp: 360 tablet, Rfl: 3   busPIRone (BUSPAR) 15 MG tablet, Take 1 tablet (15 mg total) by mouth 2 (two) times daily. Must keep follow up 09/21/20 for ongoing refills, Disp: 60 tablet, Rfl: 1   citalopram (CELEXA) 20 MG tablet, Take 1 tablet (20 mg total) by mouth daily., Disp: 30 tablet, Rfl: 11   dalfampridine 10 MG TB12, Take 1 tablet (10 mg total) by mouth 2 (two) times daily., Disp: 60 tablet, Rfl: 11   gabapentin (NEURONTIN) 800 MG tablet, Take 1 tablet (800 mg total) by mouth 4 (four) times daily., Disp: 120 tablet, Rfl: 11   metoprolol succinate (TOPROL-XL) 50 MG 24 hr tablet, Take 50 mg by mouth daily., Disp: , Rfl:    Multiple Vitamin (MULTIVITAMIN WITH MINERALS) TABS tablet, Take 1 tablet by mouth daily., Disp: 30 tablet, Rfl: 0   norethindrone (MICRONOR) 0.35 MG tablet, Take 1 tablet (0.35 mg total) by mouth daily., Disp: 1 Package, Rfl: 11   omeprazole (PRILOSEC) 40 MG capsule, Take 40 mg by mouth daily., Disp: ,  Rfl:    oxyCODONE-acetaminophen (PERCOCET) 10-325 MG tablet, Take 1 tablet by mouth every 4 (four) hours., Disp: , Rfl:    Ozanimod HCl (ZEPOSIA) 0.92 MG CAPS, TAKE ONE CAPSULE (0.92MG)  BY MOUTH ONE TIME DAILY (Patient taking differently: Take 0.92 mg by mouth daily.), Disp: 30 capsule, Rfl: 11   pantoprazole (PROTONIX) 20 MG tablet, Take 1 tablet every morning at least 30 minutes before first dose of Carafate. (Patient taking differently: Take 20 mg by mouth daily.), Disp: 30 tablet, Rfl: 0   sucralfate (CARAFATE) 1 g tablet, Take 1 tablet (1 g total) by mouth 4 (four) times daily -  with meals and at bedtime., Disp: 40 tablet, Rfl: 0   SUMAtriptan (IMITREX) 100 MG tablet, TAKE 1 TABLET BY MOUTH 1 TIME FOR UP TO 1 DOSE AS NEEDED FOR MIGRAINE. MAY REPEAT IN 2 HOURS IF HEADACHE PERSISTS OR RECURS (Patient taking differently: Take 100 mg by mouth daily as needed for migraine.), Disp: 10 tablet, Rfl: 5   tamsulosin (FLOMAX) 0.4 MG CAPS capsule, TAKE 1 CAPSULE(0.4 MG) BY MOUTH DAILY (Patient taking differently: Take 0.4 mg by mouth daily.), Disp: 30 capsule, Rfl: 11   tiZANidine (ZANAFLEX) 4 MG tablet, TAKE 1 TABLET BY MOUTH EVERY NIGHT AT BEDTIME (Patient taking differently: Take 4 mg by mouth at bedtime.), Disp: 30 tablet, Rfl: 10   methylPREDNISolone (MEDROL DOSEPAK) 4 MG TBPK tablet, Take by mouth.  (Patient not taking: Reported on 11/21/2020), Disp: , Rfl:    methylPREDNISolone (MEDROL) 4 MG tablet, Taper from 6 pills po for one day to 1 pill po the last day over 6 days (Patient not taking: No sig reported), Disp: 21 tablet, Rfl: 0   metoprolol succinate (TOPROL-XL) 25 MG 24 hr tablet, TAKE 1 TABLET BY MOUTH DAILY (Patient not taking: Reported on 11/21/2020), Disp: 30 tablet, Rfl: 5   predniSONE (DELTASONE) 10 MG tablet, 6 pills po x 1 day, then 5 pills po x 1 day, then 4 pills po x 1 day, then 3 pills po  x 1 day, then 2 pills po x 1 day, then 1 pill po x 1 day (Patient not taking: No sig reported), Disp:  21 tablet, Rfl: 0:   Allergies  Allergen Reactions   Ace Inhibitors Swelling   Amoxicillin Itching    Did it involve swelling of the face/tongue/throat, SOB, or low BP? Yes Did it involve sudden or severe rash/hives, skin peeling, or any reaction on the inside of your mouth or nose? No Did you need to seek medical attention at a hospital or doctor's office? No When did it last happen?      unknown  If all above answers are "NO", may proceed with cephalosporin use.;   Celexa [Citalopram] Swelling    mouth   Lisinopril Swelling    Lower lip swelling  :   Family History  Problem Relation Age of Onset   Healthy Mother    Healthy Father    Breast cancer Other        paternal grandmother dx age 42   Colon cancer Other        paternal grandmother   Hypertension Other    Diabetes Other   :   Social History   Socioeconomic History   Marital status: Single    Spouse name: Not on file   Number of children: Not on file   Years of education: Not on file   Highest education level: Not on file  Occupational History   Not on file  Tobacco Use   Smoking status: Never   Smokeless tobacco: Never  Vaping Use   Vaping Use: Never used  Substance and Sexual Activity   Alcohol use: No   Drug use: No   Sexual activity: Not Currently  Other Topics Concern   Not on file  Social History Narrative   Not on file   Social Determinants of Health   Financial Resource Strain: Not on file  Food Insecurity: Not on file  Transportation Needs: Not on file  Physical Activity: Not on file  Stress: Not on file  Social Connections: Not on file  Intimate Partner Violence: Not on file  :  Pertinent items are noted in HPI.  Exam: Blood pressure 115/77, pulse 93, temperature 98 F (36.7 C), resp. rate 20, height _0  (1.676 m), weight 225 lb (102.1 kg), SpO2 100 %. ECOG 1 General appearance: alert and cooperative appeared without distress. Head: atraumatic without any  abnormalities. Eyes: conjunctivae/corneas clear. PERRL.  Sclera anicteric. Throat: lips, mucosa, and tongue normal; without oral thrush or ulcers. Resp: clear to auscultation bilaterally without rhonchi, wheezes or dullness to percussion. Cardio: regular rate and rhythm, S1, S2 normal, no murmur, click, rub or gallop GI: soft, non-tender; bowel sounds normal; no masses,  no organomegaly Skin: Skin color, texture, turgor normal. No rashes or lesions Lymph nodes: Cervical, supraclavicular, and axillary nodes normal. Neurologic: Grossly normal without any motor, sensory or deep tendon reflexes. Musculoskeletal: No joint deformity or effusion.   Recent Labs    11/20/20 1952 11/21/20 0120 11/21/20 1520  WBC 4.3 5.3  --   HGB 5.0* 5.1* 7.7*  HCT 14.3* 14.4* 22.0*  PLT 296 307  --     Recent Labs    11/20/20 1952 11/22/20 0653  NA 140 137  K 3.6 3.9  CL 104 103  CO2 26 27  GLUCOSE 92 107*  BUN 15 15  CREATININE 1.36* 1.46*  CALCIUM 9.4 8.6*     Blood peripheral smear was personally reviewed today  and showed no evidence of schistocytes or red cell fragments.  No spherocytosis noted with some ovalocytes and possible rouleaux formation.    MR Brain W and Wo Contrast  Result Date: 11/21/2020 CLINICAL DATA:  Multiple sclerosis, new event EXAM: MRI HEAD WITHOUT AND WITH CONTRAST TECHNIQUE: Multiplanar, multiecho pulse sequences of the brain and surrounding structures were obtained without and with intravenous contrast. CONTRAST:  45mL GADAVIST GADOBUTROL 1 MMOL/ML IV SOLN COMPARISON:  09/01/2019 FINDINGS: Brain: Chronic white matter disease with periventricular predilection and mild hazy involvement around the fourth ventricle. No progression, restricted diffusion, enhancement, infarct, or mass. Vascular: Normal flow voids and vessel enhancements Skull and upper cervical spine: Normal marrow signal Sinuses/Orbits: Negative IMPRESSION: Multiple sclerosis with stable extent from June  2021. No evidence of active demyelination. Electronically Signed   By: Jorje Guild M.D.   On: 11/21/2020 04:50   MR CERVICAL SPINE W WO CONTRAST  Result Date: 11/21/2020 CLINICAL DATA:  Multiple sclerosis, new event EXAM: MRI CERVICAL AND THORACIC SPINE WITHOUT AND WITH CONTRAST TECHNIQUE: Multiplanar and multiecho pulse sequences of the cervical spine, to include the craniocervical junction and cervicothoracic junction, and the thoracic spine, were obtained without and with intravenous contrast. CONTRAST:  71mL GADAVIST GADOBUTROL 1 MMOL/ML IV SOLN COMPARISON:  Cervical MRI 09/01/2019 03/13/2020 FINDINGS: MRI CERVICAL SPINE FINDINGS Alignment: Normal Vertebrae: More hypointense marrow diffusely than on prior, usually from anemia. Cord: Stable appearance with no discrete plaque-like signal abnormality. No cord swelling or enhancement. Posterior Fossa, vertebral arteries, paraspinal tissues: Posterior fossa reported separately. No perispinal mass or inflammation. Disc levels: Diffusely preserved disc height and hydration. Negative facets. No neural impingement. MRI THORACIC SPINE FINDINGS Alignment:  Exaggerated thoracic kyphosis without listhesis. Vertebrae: Diffusely hypointense marrow signal on T1 weighted imaging. No focal lesion Cord: Allowing for intermittent motion artifact, no cord plaque or swelling is seen. Paraspinal and other soft tissues: Known, chronic severe left hydro nephrosis and upper hydroureter with cortical thinning that is marked. T2 hyperintensity in the right liver with hepatic cysts by prior CT. Disc levels: No herniation or impingement IMPRESSION: 1. Stable cervical and thoracic cord. No evidence of active demyelination. 2. Diffusely low marrow signal since 2021, please correlate with CBC. 3. Intermittent motion artifact. Electronically Signed   By: Jorje Guild M.D.   On: 11/21/2020 04:45   MR THORACIC SPINE W WO CONTRAST  Result Date: 11/21/2020 CLINICAL DATA:  Multiple  sclerosis, new event EXAM: MRI CERVICAL AND THORACIC SPINE WITHOUT AND WITH CONTRAST TECHNIQUE: Multiplanar and multiecho pulse sequences of the cervical spine, to include the craniocervical junction and cervicothoracic junction, and the thoracic spine, were obtained without and with intravenous contrast. CONTRAST:  49mL GADAVIST GADOBUTROL 1 MMOL/ML IV SOLN COMPARISON:  Cervical MRI 09/01/2019 03/13/2020 FINDINGS: MRI CERVICAL SPINE FINDINGS Alignment: Normal Vertebrae: More hypointense marrow diffusely than on prior, usually from anemia. Cord: Stable appearance with no discrete plaque-like signal abnormality. No cord swelling or enhancement. Posterior Fossa, vertebral arteries, paraspinal tissues: Posterior fossa reported separately. No perispinal mass or inflammation. Disc levels: Diffusely preserved disc height and hydration. Negative facets. No neural impingement. MRI THORACIC SPINE FINDINGS Alignment:  Exaggerated thoracic kyphosis without listhesis. Vertebrae: Diffusely hypointense marrow signal on T1 weighted imaging. No focal lesion Cord: Allowing for intermittent motion artifact, no cord plaque or swelling is seen. Paraspinal and other soft tissues: Known, chronic severe left hydro nephrosis and upper hydroureter with cortical thinning that is marked. T2 hyperintensity in the right liver with hepatic cysts by prior CT. Disc  levels: No herniation or impingement IMPRESSION: 1. Stable cervical and thoracic cord. No evidence of active demyelination. 2. Diffusely low marrow signal since 2021, please correlate with CBC. 3. Intermittent motion artifact. Electronically Signed   By: Jorje Guild M.D.   On: 11/21/2020 04:45   CT Renal Stone Study  Result Date: 11/21/2020 CLINICAL DATA:  Flank pain with kidney stone suspected EXAM: CT ABDOMEN AND PELVIS WITHOUT CONTRAST TECHNIQUE: Multidetector CT imaging of the abdomen and pelvis was performed following the standard protocol without IV contrast. COMPARISON:   07/19/2019 FINDINGS: Lower chest:  No contributory findings. Hepatobiliary: Small hepatic cystic densities.No evidence of biliary obstruction or stone. Pancreas: Unremarkable. Spleen: Unremarkable. Adrenals/Urinary Tract: Negative adrenals. Marked left hydronephrosis and upper hydroureter with cortical thinning. High-density in the upper pole right kidney and bladder attributed to excreting gadolinium. Unremarkable bladder. Stomach/Bowel: No obstruction. No bowel wall thickening. Diffuse colonic stool. Vascular/Lymphatic: No acute vascular abnormality. No mass or adenopathy. Reproductive:No pathologic findings. 4.1 cm cystic density in the right ovary, likely follicular based on patient age. Other: No ascites or pneumoperitoneum. Musculoskeletal: No acute abnormalities. Bilateral hip arthroplasty with iliopsoas atrophy. IMPRESSION: 1. No acute finding. 2. 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 3. Generalized colonic stool, please correlate for constipation. Electronically Signed   By: Jorje Guild M.D.   On: 11/21/2020 08:24    Assessment and Plan:    40 year old woman with:  1.  Normocytic, normochromic anemia presented acutely on 11/21/2020.  Her hemoglobin was 5.1 with CBC in 2021 was within normal range.   The differential diagnosis was discussed today with the patient in detail.  Aplastic anemia could be a possibility given her acute presentation and no clear etiology so far.  Other considerations including a plasma cell disorder, myelodysplastic syndrome are considered less likely.  Multifactorial presentation such as chronic blood loss, chronic renal sufficiency as well as anemia of chronic disease could also be a contributing factor.  Acute bleeding does not appear to be evident at this time.  She had multiple imaging studies including CT of the pelvis which I personally reviewed and no evidence of retroperitoneal hematomas.  From a management standpoint, I  agree with supportive transfusion to keep her hemoglobin above 7 unless she is symptomatic.  We will await the results of remaining work-up including serum protein electrophoresis.   If no clear-cut etiology has been identified, a bone marrow biopsy is recommended.  This can be done inpatient or outpatient pending the patient's disposition in the next 24 to 48 hours.  If she is discharged, we will arrange that to be done outpatient and follow-up with hematology.  All her questions are answered to her satisfaction.  2.  Multiple sclerosis: It is unclear if her weakness is related to a flare and she requires steroids.  I have no objections to have that done.  This will be up to her primary team as well as neurology.  3.  Follow-up and disposition: Continue to follow while she is inpatient and will arrange follow-up upon her discharge.  80  minutes were dedicated to this visit.  50% was spent face-to-face with the time was dedicated to reviewing laboratory data, imaging studies, discussing differential diagnosis and answering questions regarding future plan.    A copy of this consult has been forwarded to the requesting physician.

## 2020-11-22 NOTE — ED Notes (Signed)
Requested missing med from Pharmacy.

## 2020-11-22 NOTE — Progress Notes (Signed)
OT Cancellation Note  Patient Details Name: Dawn Peters MRN: [id number] DOB: December 24, 1980   Cancelled Treatment:    Reason Eval/Treat Not Completed: (eating dinner)   Dawn Nutting., OTR/L Acute Rehabilitation Services Pager [phone] Office [phone]  Dawn Peters M 11/22/2020, 3:37 PM

## 2020-11-22 NOTE — Progress Notes (Addendum)
Subjective: No acute concerns overnight.  Patient was seen in the ED.  She feels better this morning.  She reports improvement in her dizziness following her 2 blood infusions.  She feels her weakness is in her left lower extremity, and that she believes that this is an MS flare.  Previously she has had left lower extremity weakness with MS flares and this is her only MS flare symptom.  Denies changes in vision, other focal weakness. States this is never happened previously.  Denies recent vaginal bleeding, hematochezia, hematuria.  Objective:  Vital signs in last 24 hours: Vitals:   11/22/20 0600 11/22/20 0700 11/22/20 0800 11/22/20 0930  BP: 109/84 110/75 114/85 115/77  Pulse: 82 78 79 93  Resp: '12 12 12 20  '$ Temp:      TempSrc:      SpO2: 96% 95% 99% 100%  Weight:      Height:       Physical Exam: General: Well appearing African-American female, NAD HENT: normocephalic, atraumatic EYES: conjunctiva non-erythematous, no scleral icterus CV: regular rate, normal rhythm, no murmurs, rubs, gallops. Pulmonary: sating well on RA, lung clear to auscultation, no rales, wheezes, rhonchi Abdominal: non-distended, soft, non-tender to palpation, normal BS Skin: Warm and dry, no rashes or lesions Neurological: MS: awake, alert and oriented x3, normal speech and fund of knowledge Motor Exam: RUE Flexion: 5/5 RUE Extension:5/5 LUE Flexion: 5/5 LUE Extension: 5/5 R Grip: 5/5 L Grip: 5/5 R Hip Flexion: 5/5 L Hip Flexion: 4/5 (reports history of left hip surgery) RLE Flexion: 5/5 RLE Extension: 5/5 LLE Flexion: 5/5 LLE Extension: 5/5 Dorsiflexion R: 5/5 Dorsiflexion L: 5/5 (reports history of drop foot?) Plantar Flexion R: 5/5 Plantar Flexion L: 5/5  Psych: normal affect   Assessment/Plan:  Active Problems:   Symptomatic anemia  Dawn Peters is a 40 y.o. with pertinent PMH of multiple sclerosis, DVT/PE on Eliquis, hypertension, chronic renal disease who presented with  left leg weakness and dizziness and admitted for symptomatic anemia on hospital day 1   #Symptomatic normocytic anemia, likely hyperproliferation Patient reported 1wk of malaise, headaches, lightheadedness, dizziness.  Hemoglobin of 5.0 on admission without evidence of acute bleed or active hemolysis.  Low absolute reticulocyte count.  Normal iron 135.  Ferritin 556.  Vitamin B12 normal.  Folate mildly low 3.7.  Prelim smear ovalocytes.  Warm Ab on type and screen.  Concern for hypoproliferative process given low reticulocyte count.  No active bleeding with normal iron levels.  Bilirubin normal. -Hematology consulted, appreciate recs -Neurology reports [name] can cause lymphopenia less likely anemia. -F/U RF, ANCA titers, ANA -F/U SPEP, UPEP UIFE, light chains, kappa lambda light chains -F/U pathology smear review -F/U FOCT bld -AM CBC pending following multiple difficulties obtaining ordered [id number] CBC -Folic acid supplement  #Left lower extremity weakness #Multiple sclerosis  Patient has a history of MS for which she follows with Dr Felecia Shelling; last OV 09/21/20.  Reported left lower extremity weakness with prior flares.  She presented to ED with left lower extremity weakness and was concern for MS flare.  MRI spine, brain without new signal abnormalities to suggest acute flare. -Neurology contacted, patient likely has recrudescence of prior MS flare symptoms in the setting of other acute illness without acute flare.  Likely does not need steroids, though could consider treating with 1,250 mg daily prednisone p.o. if there is high clinical suspicion of MS flare. -Continue Zeposia daily -Dalfampridine twice daily -Amantadine twice daily  -Gabapentin 800 mg QID, tizanidine 4  mg nightly, amitriptyline 25-50 mg nightly, baclofen 20 mg 4 times daily as needed  #Hypertension Patient has a history of hypertension for which she is on amlodipine and metoprolol. She reports compliance with her medications.  She is normotensive at this time. No evidence of active bleed.  - Continue amlodipine '5mg'$  daily  - Continue metoprolol '50mg'$  daily   #CKD Stage IIIa Patient has a known history of chronic renal disease. Serum creatinine appears to be at baseline at this time.  - Continue to monitor renal function - Avoid nephrotoxic agents    #Hx of DVT/PE  Patient is on Eliquis daily for a history of bilateral DVT and one PE in 2014. She reports compliance with medications and has not missed any doses. Without evidence of ongoing bleed being cause of patient's anemia, can continue with home dose of Eliquis. - Continue Eliquis '5mg'$  bid   #Chronic back pain - Continue oxy linked order which approximates home dose  #Depression, Anxiety -Continue Celexa 20 mg daily, Adderall Exar 20 mg daily, BuSpar 15 mg twice daily  Other home medications continued: Methadone, Adderall XR, Protonix 40 mg daily, sucralfate  Diet: Heart healthy VTE: Eliquis IVF: None Code: Full   Prior to Admission Living Arrangement: Home Anticipated Discharge Location: Home Barriers to Discharge: Pending clinical improvement Dispo: Anticipated discharge in approximately 1-2 day(s).   [name], MD 11/22/2020, 11:27 AM Pager: [phone] After 5pm on weekdays and 1pm on weekends: On Call pager [phone]

## 2020-11-22 NOTE — Progress Notes (Signed)
Date: 11/22/2020  Patient name: Dawn Peters  Medical record number: [id number]  Date of birth: December 16, 1980   I have seen and evaluated Dawn Peters and discussed their care with the Residency Team.  In brief, patient is a 40 year old female with a past medical history of MS, CKD stage IIIa who presented to the ED with left leg weakness and lightheadedness.  Patient states that over the last 3 days prior to her admission she developed worsening left lower extremity weakness consistent with her prior MS flares as well as associated lightheadedness/dizziness.  She came to the ED for further evaluation of this.  Patient also reports associated headaches which were intermittent and consistent with her history of migraines.  She also complained of increased fatigue over the last week.  No chest pain, no shortness of breath, no palpitations, no syncope, no nausea or vomiting, no tingling or numbness, no abdominal pain, no diarrhea.  No history of melena or hematochezia.  No history of menorrhagia, no hematuria.  No skin lesions or rashes noted.  Today, patient states that her lightheadedness has resolved but that she still has some persistent left lower extremity weakness.  PMHx, Fam Hx, and/or Soc Hx : As per resident admit note  Vitals:   11/22/20 0930 11/22/20 1303  BP: 115/77 125/82  Pulse: 93 88  Resp: 20 15  Temp:    SpO2: 100% 95%   General: Awake, alert, oriented x3, NAD CVS: Regular rhythm, normal heart sounds.  Lungs: CTA bilaterally Abdomen: Soft, nontender, nondistended, normoactive bowel sounds Extremities: No edema noted, non tender to palpation HEENT: Normocephalic, atraumatic Psych: Normal mood and affect Skin: No rashes noted, warm and dry Neuro: Power 5-5 bilateral upper and right lower extremity with 4 out of 5 power with left hip flexion.  Oriented x3  Assessment and Plan: I have seen and evaluated the patient as outlined above. I agree with the formulated  Assessment and Plan as detailed in the residents' note, with the following changes:   1.  Acute symptomatic normocytic anemia: -Patient presented to ED with left lower extremity weakness as well as lightheadedness/dizziness over the last week and associated fatigue.  She was found to have a hemoglobin of 5.0 on admission but RBCs were normocytic.  Iron levels were within normal limits and patient has had no evidence of recent bleed. -Patient follows up with neurology frequently and her blood counts were within normal limits as recently as July 2022 -Patient was transfused 2 units PRBC here with improvement in hemoglobin to 7.9 today -Patient bilirubin was within normal limits and she has no other evidence of hemolysis.  Peripheral blood smear showed ovalocytes -We will follow-up SPEP/UPEP but plasma cell disorder and myelodysplastic syndrome are less likely -We will follow-up parvovirus titers as well -CT abdomen/pelvis with no evidence of RP bleed -Hematology follow-up recommendations appreciated.  The etiology behind patient's normocytic anemia remains uncertain at this time.  If no clear-cut etiology is identified patient will require bone marrow biopsy which could be done as an inpatient versus outpatient -Patient is not on any medications that could cause anemia (Zeposia can cause lymphopenia but unlikely to cause anemia) -Patient's folate is mildly low at 3.7.  We will supplement her folic acid at this time but this is unlikely to be the cause of her anemia -No further work-up at this time  2.  MS with left lower extremity weakness: -Patient presented to the ED with left lower extremity weakness and was concern for  MS flare.  MRI of her brain and spine showed no new demyelinating lesions suggestive of an MS flare -Resident discussed case with neurology who believes this is a record essence of her prior MS flare symptoms and she likely does not need steroids -If her symptoms persist could  treat patient with 1250 mg of prednisone daily for 3 to 5-day course -Continue with current MS medications -No further work-up at this time  Aldine Contes, MD 9/18/20221:15 PM

## 2020-11-22 NOTE — ED Notes (Signed)
Pt declined breakfast tray and request only cornflakes- order placed.

## 2020-11-23 ENCOUNTER — Inpatient Hospital Stay (HOSPITAL_COMMUNITY): Payer: Medicare Other

## 2020-11-23 DIAGNOSIS — D649 Anemia, unspecified: Secondary | ICD-10-CM | POA: Diagnosis not present

## 2020-11-23 DIAGNOSIS — D509 Iron deficiency anemia, unspecified: Secondary | ICD-10-CM

## 2020-11-23 LAB — CBC WITH DIFFERENTIAL/PLATELET
Abs Immature Granulocytes: 0.04 10*3/uL (ref 0.00–0.07)
Basophils Absolute: 0 10*3/uL (ref 0.0–0.1)
Basophils Relative: 0 %
Eosinophils Absolute: 0.1 10*3/uL (ref 0.0–0.5)
Eosinophils Relative: 3 %
HCT: 20.9 % — ABNORMAL LOW (ref 36.0–46.0)
Hemoglobin: 7.1 g/dL — ABNORMAL LOW (ref 12.0–15.0)
Immature Granulocytes: 2 %
Lymphocytes Relative: 3 %
Lymphs Abs: 0.1 10*3/uL — ABNORMAL LOW (ref 0.7–4.0)
MCH: 31.6 pg (ref 26.0–34.0)
MCHC: 34 g/dL (ref 30.0–36.0)
MCV: 92.9 fL (ref 80.0–100.0)
Monocytes Absolute: 0.4 10*3/uL (ref 0.1–1.0)
Monocytes Relative: 16 %
Neutro Abs: 1.9 10*3/uL (ref 1.7–7.7)
Neutrophils Relative %: 76 %
Platelets: 232 10*3/uL (ref 150–400)
RBC: 2.25 MIL/uL — ABNORMAL LOW (ref 3.87–5.11)
RDW: 14.7 % (ref 11.5–15.5)
WBC: 2.5 10*3/uL — ABNORMAL LOW (ref 4.0–10.5)
nRBC: 0 % (ref 0.0–0.2)

## 2020-11-23 LAB — COMPREHENSIVE METABOLIC PANEL
ALT: 18 U/L (ref 0–44)
AST: 19 U/L (ref 15–41)
Albumin: 3.1 g/dL — ABNORMAL LOW (ref 3.5–5.0)
Alkaline Phosphatase: 104 U/L (ref 38–126)
Anion gap: 7 (ref 5–15)
BUN: 15 mg/dL (ref 6–20)
CO2: 27 mmol/L (ref 22–32)
Calcium: 8.7 mg/dL — ABNORMAL LOW (ref 8.9–10.3)
Chloride: 102 mmol/L (ref 98–111)
Creatinine, Ser: 1.41 mg/dL — ABNORMAL HIGH (ref 0.44–1.00)
GFR, Estimated: 48 mL/min — ABNORMAL LOW (ref >60)
Glucose, Bld: 108 mg/dL — ABNORMAL HIGH (ref 70–99)
Potassium: 3.8 mmol/L (ref 3.5–5.1)
Sodium: 136 mmol/L (ref 135–145)
Total Bilirubin: 0.6 mg/dL (ref 0.3–1.2)
Total Protein: 5.6 g/dL — ABNORMAL LOW (ref 6.5–8.1)

## 2020-11-23 LAB — PROTEIN ELECTROPHORESIS, SERUM
A/G Ratio: 1.3 (ref 0.7–1.7)
Albumin ELP: 3.6 g/dL (ref 2.9–4.4)
Alpha-1-Globulin: 0.3 g/dL (ref 0.0–0.4)
Alpha-2-Globulin: 0.7 g/dL (ref 0.4–1.0)
Beta Globulin: 1 g/dL (ref 0.7–1.3)
Gamma Globulin: 0.8 g/dL (ref 0.4–1.8)
Globulin, Total: 2.8 g/dL (ref 2.2–3.9)
Total Protein ELP: 6.4 g/dL (ref 6.0–8.5)

## 2020-11-23 LAB — HEMOGLOBIN AND HEMATOCRIT, BLOOD
HCT: 22.6 % — ABNORMAL LOW (ref 36.0–46.0)
Hemoglobin: 7.7 g/dL — ABNORMAL LOW (ref 12.0–15.0)

## 2020-11-23 LAB — ANA W/REFLEX IF POSITIVE: Anti Nuclear Antibody (ANA): NEGATIVE

## 2020-11-23 LAB — KAPPA/LAMBDA LIGHT CHAINS
Kappa free light chain: 31.6 mg/L — ABNORMAL HIGH (ref 3.3–19.4)
Kappa, lambda light chain ratio: 1.47 (ref 0.26–1.65)
Lambda free light chains: 21.5 mg/L (ref 5.7–26.3)

## 2020-11-23 MED ORDER — MIDAZOLAM HCL 2 MG/2ML IJ SOLN
INTRAMUSCULAR | Status: DC | PRN
  Administered 2020-11-23 (×2): 1 mg via INTRAVENOUS

## 2020-11-23 MED ORDER — FENTANYL CITRATE (PF) 100 MCG/2ML IJ SOLN
INTRAMUSCULAR | Status: AC
  Filled 2020-11-23: qty 2

## 2020-11-23 MED ORDER — TAMSULOSIN HCL 0.4 MG PO CAPS
0.4000 mg | ORAL_CAPSULE | Freq: Every day | ORAL | Status: DC
  Administered 2020-11-23 – 2020-11-27 (×5): 0.4 mg via ORAL
  Filled 2020-11-23 (×5): qty 1

## 2020-11-23 MED ORDER — FENTANYL CITRATE (PF) 100 MCG/2ML IJ SOLN
INTRAMUSCULAR | Status: DC | PRN
  Administered 2020-11-23 (×3): 25 ug via INTRAVENOUS

## 2020-11-23 MED ORDER — SODIUM CHLORIDE 0.9 % IV SOLN
500.0000 mg | Freq: Every day | INTRAVENOUS | Status: AC
  Administered 2020-11-23 – 2020-11-25 (×3): 500 mg via INTRAVENOUS
  Filled 2020-11-23: qty 4
  Filled 2020-11-23 (×2): qty 8

## 2020-11-23 MED ORDER — MIDAZOLAM HCL 2 MG/2ML IJ SOLN
INTRAMUSCULAR | Status: AC
  Filled 2020-11-23: qty 2

## 2020-11-23 MED ORDER — LIDOCAINE HCL 1 % IJ SOLN
INTRAMUSCULAR | Status: AC
  Filled 2020-11-23: qty 10

## 2020-11-23 NOTE — Progress Notes (Signed)
Arrived back on unit from CT bone marrow biopsy. Vitals stable, in no distress. Site is clean, dry and intact.   11/23/20 1004  Vitals  BP 98/72  MAP (mmHg) 80  BP Location Right Arm  BP Method Automatic  Patient Position (if appropriate) Lying  Pulse Rate 91  Pulse Rate Source Monitor  Resp 16  Level of Consciousness  Level of Consciousness Alert  MEWS COLOR  MEWS Score Color Green  Oxygen Therapy  SpO2 96 %  O2 Device Room Air  Pain Assessment  Pain Scale 0-10  Pain Score 0  MEWS Score  MEWS Temp 0  MEWS Systolic 1  MEWS Pulse 0  MEWS RR 0  MEWS LOC 0  MEWS Score 1

## 2020-11-23 NOTE — Procedures (Signed)
Interventional Radiology Procedure Note  Procedure: CT guided aspirate and core biopsy of right iliac bone  Complications: None  Recommendations: - Bedrest supine x 1 hrs - Hydrocodone PRN  Pain - Follow biopsy results   Reilyn Nelson, MD   

## 2020-11-23 NOTE — H&P (Signed)
     Chief Complaint: Patient was seen in consultation today for request of bone marrow biopsy at the request of Dr. Eva Zinoviev.  Supervising Physician: Suttle, Dylan  Patient Status: [facility] - In-pt  History of Present Illness: Dawn Peters is a 40 y.o. female with admission related to weakness in her left lower extremity and dizziness. Her Hgb was 5.1 and she has received two infusions.  She has a history of MS, DVT/PE on Eliquis, HTN, and Chronic renal disease found to have symptomatic anemia.  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   left lower extremity-- treated with eliquis   Gait disturbance    due to MS   GERD (gastroesophageal reflux disease)    watch diet   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/p  THA   History of DVT of lower extremity 2007   History of encephalopathy 04/2017   acute metabolic encephalopathy secondary to MS,  resolved   History of MRSA infection 2004   right hip infection post THA   History of pulmonary embolus (PE) 2005   Hydronephrosis, left    w/ acute kidney injury 02/ 2019   Hypertension    Left-sided weakness    due to MS   Migraines    MS (multiple sclerosis) (HCC) neurologist-  dr satar   dx 2000--   Muscle spasticity    Neurogenic bladder    due to MS   Pulmonary embolism, bilateral (HCC) 09/17/2017   treated w/ eliquis   Strains to urinate    Urgency of urination     Past Surgical History:  Procedure Laterality Date   CYSTOSCOPY WITH RETROGRADE PYELOGRAM, URETEROSCOPY AND STENT PLACEMENT Bilateral 11/29/2017   Procedure: BILATERAL  RETROGRADE PYELOGRAM, LEFT DIAGNOSIC URETEROSCOPY AND STENT LEFT  PLACEMENT;  Surgeon: Herrick, Benjamin W, MD;  Location: [facility];  Service: Urology;  Laterality: Bilateral;   HIP ARTHROSCOPY Left 07-05-2013   dr pill @[facility]   iliopsosas release, synovectomy   REVISION TOTAL HIP ARTHROPLASTY Right 03-28-2003    dr olin @[facility]   TOTAL HIP ARTHROPLASTY Bilateral left 11-05-2002  dr olin @[facility];   right 06/ 2004  @[facility]    Allergies: Ace inhibitors, Amoxicillin, Celexa [citalopram], and Lisinopril  Medications: Prior to Admission medications   Medication Sig Start Date End Date Taking? Authorizing Provider  acetaminophen (TYLENOL) 500 MG tablet Take 1,000 mg by mouth every 6 (six) hours as needed for moderate pain or headache.   Yes [provider]  amantadine (SYMMETREL) 100 MG capsule Take 100 mg by mouth 2 (two) times daily. 09/27/20  Yes [provider]  amitriptyline (ELAVIL) 25 MG tablet TAKE 1 TO 2 TABLETS(25 TO 50 MG) BY MOUTH AT BEDTIME Patient taking differently: Take 25-50 mg by mouth at bedtime. 11/17/20  Yes Sater, Richard A, MD  amLODipine (NORVASC) 5 MG tablet Take 1 tablet (5 mg total) by mouth daily. Patient taking differently: Take 5 mg by mouth daily after breakfast. 05/21/17  Yes Arrien, Mauricio Daniel, MD  amphetamine-dextroamphetamine (ADDERALL XR) 20 MG 24 hr capsule Take 1 capsule (20 mg total) by   mouth daily. 11/05/20  Yes Sater, Nanine Means, MD  apixaban (ELIQUIS) 5 MG TABS tablet Take 1 tablet (5 mg total) by mouth 2 (two) times daily. 09/21/20  Yes Sater, Nanine Means, MD  baclofen (LIORESAL) 20 MG tablet Take 1 tablet (20 mg total) by mouth 4 (four) times daily as needed for muscle spasms. 09/16/19  Yes Sater, Nanine Means, MD  busPIRone (BUSPAR) 15 MG tablet Take 1 tablet (15 mg total) by mouth 2 (two) times daily. Must keep follow up 09/21/20 for ongoing refills 08/18/20  Yes Sater, Nanine Means, MD  citalopram (CELEXA) 20 MG tablet Take 1 tablet (20 mg total) by mouth daily. 02/19/19  Yes Sater, Nanine Means, MD   dalfampridine 10 MG TB12 Take 1 tablet (10 mg total) by mouth 2 (two) times daily. 09/21/20  Yes Sater, Nanine Means, MD  gabapentin (NEURONTIN) 800 MG tablet Take 1 tablet (800 mg total) by mouth 4 (four) times daily. 09/21/20  Yes Sater, Nanine Means, MD  metoprolol succinate (TOPROL-XL) 50 MG 24 hr tablet Take 50 mg by mouth daily. 09/27/20  Yes [provider]  Multiple Vitamin (MULTIVITAMIN WITH MINERALS) TABS tablet Take 1 tablet by mouth daily. 09/20/17  Yes Kayleen Memos, DO  norethindrone (MICRONOR) 0.35 MG tablet Take 1 tablet (0.35 mg total) by mouth daily. 11/19/18  Yes Clarnce Flock, MD  omeprazole (PRILOSEC) 40 MG capsule Take 40 mg by mouth daily. 09/27/20  Yes [provider]  oxyCODONE-acetaminophen (PERCOCET) 10-325 MG tablet Take 1 tablet by mouth every 4 (four) hours. 03/18/18  Yes [provider]  Ozanimod HCl (ZEPOSIA) 0.92 MG CAPS TAKE ONE CAPSULE (0.92MG)  BY MOUTH ONE TIME DAILY Patient taking differently: Take 0.92 mg by mouth daily. 03/09/20  Yes Sater, Nanine Means, MD  pantoprazole (PROTONIX) 20 MG tablet Take 1 tablet every morning at least 30 minutes before first dose of Carafate. Patient taking differently: Take 20 mg by mouth daily. 07/19/19  Yes Molpus, John, MD  sucralfate (CARAFATE) 1 g tablet Take 1 tablet (1 g total) by mouth 4 (four) times daily -  with meals and at bedtime. 07/19/19  Yes Molpus, John, MD  SUMAtriptan (IMITREX) 100 MG tablet TAKE 1 TABLET BY MOUTH 1 TIME FOR UP TO 1 DOSE AS NEEDED FOR MIGRAINE. MAY REPEAT IN 2 HOURS IF HEADACHE PERSISTS OR RECURS Patient taking differently: Take 100 mg by mouth daily as needed for migraine. 09/21/20  Yes Sater, Nanine Means, MD  tamsulosin (FLOMAX) 0.4 MG CAPS capsule TAKE 1 CAPSULE(0.4 MG) BY MOUTH DAILY Patient taking differently: Take 0.4 mg by mouth daily. 08/05/20  Yes Sater, Nanine Means, MD  tiZANidine (ZANAFLEX) 4 MG tablet TAKE 1 TABLET BY MOUTH EVERY NIGHT AT BEDTIME Patient taking differently:  Take 4 mg by mouth at bedtime. 08/05/20  Yes Sater, Nanine Means, MD  methylPREDNISolone (MEDROL DOSEPAK) 4 MG TBPK tablet Take by mouth.  Patient not taking: Reported on 11/21/2020 08/19/19   [provider]  methylPREDNISolone (MEDROL) 4 MG tablet Taper from 6 pills po for one day to 1 pill po the last day over 6 days Patient not taking: No sig reported 09/01/19   Garald Balding, PA-C  metoprolol succinate (TOPROL-XL) 25 MG 24 hr tablet TAKE 1 TABLET BY MOUTH DAILY Patient not taking: Reported on 11/21/2020 08/25/19   Sater, Nanine Means, MD  predniSONE (DELTASONE) 10 MG tablet 6 pills po x 1 day, then 5 pills po x 1 day, then 4 pills po x  1 day, then 3 pills po x 1 day, then 2 pills po x 1 day, then 1 pill po x 1 day Patient not taking: No sig reported 11/20/19   Sater, Richard A, MD  Oxcarbazepine (TRILEPTAL) 300 MG tablet Take 1 tablet (300 mg total) by mouth 2 (two) times daily. Patient not taking: Reported on 07/19/2019 11/11/15 07/19/19  Sater, Richard A, MD     Family History  Problem Relation Age of Onset   Healthy Mother    Healthy Father    Breast cancer Other        paternal grandmother dx age 74   Colon cancer Other        paternal grandmother   Hypertension Other    Diabetes Other     Social History   Socioeconomic History   Marital status: Single    Spouse name: Not on file   Number of children: Not on file   Years of education: Not on file   Highest education level: Not on file  Occupational History   Not on file  Tobacco Use   Smoking status: Never   Smokeless tobacco: Never  Vaping Use   Vaping Use: Never used  Substance and Sexual Activity   Alcohol use: No   Drug use: No   Sexual activity: Not Currently  Other Topics Concern   Not on file  Social History Narrative   Not on file   Social Determinants of Health   Financial Resource Strain: Not on file  Food Insecurity: Not on file  Transportation Needs: Not on file  Physical Activity: Not on file   Stress: Not on file  Social Connections: Not on file    Review of Systems: A 12 point ROS discussed and pertinent positives are indicated in the HPI above.  All other systems are negative.  Review of Systems  Constitutional:  Positive for activity change and fatigue.  Respiratory: Negative.    Cardiovascular: Negative.   Gastrointestinal: Negative.   Musculoskeletal:  Positive for myalgias.  Neurological: Negative.   Psychiatric/Behavioral: Negative.     Vital Signs: BP 107/71 (BP Location: Left Arm)   Pulse 89   Temp 98 F (36.7 C) (Oral)   Resp 16   Ht 5' 6" (1.676 m)   Wt 215 lb 6.4 oz (97.7 kg)   SpO2 98%   BMI 34.77 kg/m   Physical Exam Constitutional:      General: She is not in acute distress.    Appearance: She is obese.  HENT:     Head: Normocephalic and atraumatic.     Mouth/Throat:     Mouth: Mucous membranes are moist.     Pharynx: Oropharynx is clear.  Cardiovascular:     Rate and Rhythm: Normal rate and regular rhythm.  Pulmonary:     Effort: Pulmonary effort is normal.     Breath sounds: Normal breath sounds.  Abdominal:     General: Abdomen is flat.     Palpations: Abdomen is soft.  Skin:    General: Skin is dry.     Capillary Refill: Capillary refill takes 2 to 3 seconds.  Neurological:     General: No focal deficit present.     Mental Status: She is alert.  Psychiatric:        Behavior: Behavior normal.        Thought Content: Thought content normal.    Imaging: MR Brain W and Wo Contrast  Result Date: 11/21/2020 CLINICAL DATA:  Multiple sclerosis,   new event EXAM: MRI HEAD WITHOUT AND WITH CONTRAST TECHNIQUE: Multiplanar, multiecho pulse sequences of the brain and surrounding structures were obtained without and with intravenous contrast. CONTRAST:  10mL GADAVIST GADOBUTROL 1 MMOL/ML IV SOLN COMPARISON:  09/01/2019 FINDINGS: Brain: Chronic white matter disease with periventricular predilection and mild hazy involvement around the fourth  ventricle. No progression, restricted diffusion, enhancement, infarct, or mass. Vascular: Normal flow voids and vessel enhancements Skull and upper cervical spine: Normal marrow signal Sinuses/Orbits: Negative IMPRESSION: Multiple sclerosis with stable extent from June 2021. No evidence of active demyelination. Electronically Signed   By: Jonathan  Watts M.D.   On: 11/21/2020 04:50   MR CERVICAL SPINE W WO CONTRAST  Result Date: 11/21/2020 CLINICAL DATA:  Multiple sclerosis, new event EXAM: MRI CERVICAL AND THORACIC SPINE WITHOUT AND WITH CONTRAST TECHNIQUE: Multiplanar and multiecho pulse sequences of the cervical spine, to include the craniocervical junction and cervicothoracic junction, and the thoracic spine, were obtained without and with intravenous contrast. CONTRAST:  10mL GADAVIST GADOBUTROL 1 MMOL/ML IV SOLN COMPARISON:  Cervical MRI 09/01/2019 03/13/2020 FINDINGS: MRI CERVICAL SPINE FINDINGS Alignment: Normal Vertebrae: More hypointense marrow diffusely than on prior, usually from anemia. Cord: Stable appearance with no discrete plaque-like signal abnormality. No cord swelling or enhancement. Posterior Fossa, vertebral arteries, paraspinal tissues: Posterior fossa reported separately. No perispinal mass or inflammation. Disc levels: Diffusely preserved disc height and hydration. Negative facets. No neural impingement. MRI THORACIC SPINE FINDINGS Alignment:  Exaggerated thoracic kyphosis without listhesis. Vertebrae: Diffusely hypointense marrow signal on T1 weighted imaging. No focal lesion Cord: Allowing for intermittent motion artifact, no cord plaque or swelling is seen. Paraspinal and other soft tissues: Known, chronic severe left hydro nephrosis and upper hydroureter with cortical thinning that is marked. T2 hyperintensity in the right liver with hepatic cysts by prior CT. Disc levels: No herniation or impingement IMPRESSION: 1. Stable cervical and thoracic cord. No evidence of active  demyelination. 2. Diffusely low marrow signal since 2021, please correlate with CBC. 3. Intermittent motion artifact. Electronically Signed   By: Jonathan  Watts M.D.   On: 11/21/2020 04:45   MR THORACIC SPINE W WO CONTRAST  Result Date: 11/21/2020 CLINICAL DATA:  Multiple sclerosis, new event EXAM: MRI CERVICAL AND THORACIC SPINE WITHOUT AND WITH CONTRAST TECHNIQUE: Multiplanar and multiecho pulse sequences of the cervical spine, to include the craniocervical junction and cervicothoracic junction, and the thoracic spine, were obtained without and with intravenous contrast. CONTRAST:  10mL GADAVIST GADOBUTROL 1 MMOL/ML IV SOLN COMPARISON:  Cervical MRI 09/01/2019 03/13/2020 FINDINGS: MRI CERVICAL SPINE FINDINGS Alignment: Normal Vertebrae: More hypointense marrow diffusely than on prior, usually from anemia. Cord: Stable appearance with no discrete plaque-like signal abnormality. No cord swelling or enhancement. Posterior Fossa, vertebral arteries, paraspinal tissues: Posterior fossa reported separately. No perispinal mass or inflammation. Disc levels: Diffusely preserved disc height and hydration. Negative facets. No neural impingement. MRI THORACIC SPINE FINDINGS Alignment:  Exaggerated thoracic kyphosis without listhesis. Vertebrae: Diffusely hypointense marrow signal on T1 weighted imaging. No focal lesion Cord: Allowing for intermittent motion artifact, no cord plaque or swelling is seen. Paraspinal and other soft tissues: Known, chronic severe left hydro nephrosis and upper hydroureter with cortical thinning that is marked. T2 hyperintensity in the right liver with hepatic cysts by prior CT. Disc levels: No herniation or impingement IMPRESSION: 1. Stable cervical and thoracic cord. No evidence of active demyelination. 2. Diffusely low marrow signal since 2021, please correlate with CBC. 3. Intermittent motion artifact. Electronically Signed   By: Jonathan    Watts M.D.   On: 11/21/2020 04:45   CT Renal  Stone Study  Result Date: 11/21/2020 CLINICAL DATA:  Flank pain with kidney stone suspected EXAM: CT ABDOMEN AND PELVIS WITHOUT CONTRAST TECHNIQUE: Multidetector CT imaging of the abdomen and pelvis was performed following the standard protocol without IV contrast. COMPARISON:  07/19/2019 FINDINGS: Lower chest:  No contributory findings. Hepatobiliary: Small hepatic cystic densities.No evidence of biliary obstruction or stone. Pancreas: Unremarkable. Spleen: Unremarkable. Adrenals/Urinary Tract: Negative adrenals. Marked left hydronephrosis and upper hydroureter with cortical thinning. High-density in the upper pole right kidney and bladder attributed to excreting gadolinium. Unremarkable bladder. Stomach/Bowel: No obstruction. No bowel wall thickening. Diffuse colonic stool. Vascular/Lymphatic: No acute vascular abnormality. No mass or adenopathy. Reproductive:No pathologic findings. 4.1 cm cystic density in the right ovary, likely follicular based on patient age. Other: No ascites or pneumoperitoneum. Musculoskeletal: No acute abnormalities. Bilateral hip arthroplasty with iliopsoas atrophy. IMPRESSION: 1. No acute finding. 2. 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 3. Generalized colonic stool, please correlate for constipation. Electronically Signed   By: Jorje Guild M.D.   On: 11/21/2020 08:24    Labs:  CBC: Recent Labs    11/21/20 0120 11/21/20 1520 11/22/20 1229 11/22/20 2016 11/23/20 0218  WBC 5.3  --  3.1* 3.2* 2.5*  HGB 5.1* 7.7* 7.9* 8.1* 7.1*  HCT 14.4* 22.0* 23.5* 22.9* 20.9*  PLT 307  --  250 196 232    COAGS: Recent Labs    11/20/20 2340  INR 1.3*    BMP: Recent Labs    11/20/20 1952 11/22/20 0653 11/23/20 0218  NA 140 137 136  K 3.6 3.9 3.8  CL 104 103 102  CO2 _0 GLUCOSE 92 107* 108*  BUN _1 CALCIUM 9.4 8.6* 8.7*  CREATININE 1.36* 1.46* 1.41*  GFRNONAA 50* 46* 48*    LIVER FUNCTION TESTS: Recent Labs     11/20/20 2340 11/23/20 0218  BILITOT 1.0 0.6  AST 17 19  ALT 19 18  ALKPHOS 107 104  PROT 6.5 5.6*  ALBUMIN 3.5 3.1*     Assessment and Plan:  Anemia MS Weakness OK to proceed with planned bone marrow biopsy.  Risks and benefits of bone marrow biopsy was discussed with the patient and/or patient's family including, but not limited to bleeding, infection, damage to adjacent structures or low yield requiring additional tests.  All of the questions were answered and there is agreement to proceed.  Consent signed and in chart.    Thank you for this interesting consult.  I greatly enjoyed meeting Dawn Peters and look forward to participating in their care.  A copy of this report was sent to the requesting provider on this date.  Electronically Signed: Pasty Spillers, PA 11/23/2020, 8:33 AM   I spent a total of 40 Minutes  in face to face in clinical consultation, greater than 50% of which was counseling/coordinating care for image-guided bone marrow biopsy

## 2020-11-23 NOTE — Progress Notes (Signed)
Patient's status was updated this afternoon.  She completed bone marrow biopsy without any complications.  She continues to have lower back pain that has not preceded the biopsy.  Imaging studies including cervical and thoracic spine MRI did not show any acute pathology.  I discussed with the patient the rationale for obtaining a bone marrow biopsy and anticipate the results preliminary in the next 48 to 72 hours.  Differential diagnosis was reiterated including aplastic anemia versus other infiltrative bone marrow process.   Will continue to follow.

## 2020-11-23 NOTE — Progress Notes (Addendum)
Subjective: No acute concerns overnight.  Patient was taken for bone marrow biopsy this morning.  She was seen and examined on rounds after return from biopsy.  Patient endorses sleepiness after anesthesia.  She is having some soreness of her back though feels her pain is managed currently on her medications.  She continues to endorse left lower extremity weakness and feels strongly that this is an MS flare.  Patient would prefer to treat with steroids.  She denies fever, nausea, constipation, rashes.   Objective:  Vital signs in last 24 hours: Vitals:   11/23/20 0100 11/23/20 0119 11/23/20 0121 11/23/20 0440  BP:  98/64 97/63 104/68  Pulse:  82 82 88  Resp:  16  16  Temp:  98.2 F (36.8 C)  98.2 F (36.8 C)  TempSrc:  Oral  Oral  SpO2:  100% 98% 100%  Weight: 97.7 kg     Height:       Physical Exam:  General: Well appearing African-American female, NAD HENT: normocephalic, atraumatic EYES: conjunctiva non-erythematous, no scleral icterus CV: regular rate, normal rhythm, no murmurs, rubs, gallops. Pulmonary: sating well on RA, lung clear to auscultation, no rales, wheezes, rhonchi Abdominal: non-distended, soft, non-tender to palpation, normal BS Skin: Warm and dry, no rashes or lesions Neurological: MS: awake, alert and oriented x3, normal speech and fund of knowledge Motor Exam: RUE Flexion: 5/5 RUE Extension:5/5 LUE Flexion: 5/5 LUE Extension: 5/5 R Grip: 5/5 L Grip: 5/5 R Hip Flexion: 5/5 L Hip Flexion: 4/5 (reports history of left hip surgery) RLE Flexion: 5/5 RLE Extension: 5/5 LLE Flexion: 5/5 LLE Extension: 5/5 Dorsiflexion R: 5/5 Dorsiflexion L: 5/5 (reports history of drop foot?) Plantar Flexion R: 5/5 Plantar Flexion L: 5/5  Psych: normal affect   Assessment/Plan:  Active Problems:   Symptomatic anemia  Dawn Peters is a 40 y.o. with pertinent PMH of multiple sclerosis, DVT/PE on Eliquis, hypertension, chronic renal disease who presented  with left leg weakness and dizziness and admitted for symptomatic anemia on hospital day 2   #Symptomatic normocytic anemia, likely hypoproliferative Patient reported 1wk of malaise, headaches, lightheadedness, dizziness.  Hemoglobin of 5.0 on admission without evidence of acute bleed or active hemolysis.  Low absolute reticulocyte count.  Normal iron 135.  Ferritin 556.  Vitamin B12 normal.  Folate mildly low 3.7.  Prelim smear ovalocytes.  Warm Ab on type and screen.  No active bleeding with normal iron levels.  Bilirubin normal.  Kappa free light chain elevated though ratio normal. ANA negative Concern for hypoproliferative process given low reticulocyte count.  CBC remaining stable~7-8. -Hematology consulted, appreciate recs -Neurology reports Dawn Peters can cause lymphopenia less likely anemia. -F/U bone marrow biopsy results -F/U RF, ANCA titers -F/U SPEP, UPEP UIFE, light chains -F/U pathology smear review -F/U FOCT bld -Folic acid supplement -Trend CBC  #Left lower extremity weakness #Multiple sclerosis  Patient has a history of MS for which she follows with Dr Felecia Shelling; last OV 09/21/20.  Reported left lower extremity weakness with prior flares.  She presented to ED with left lower extremity weakness and was concern for MS flare.  MRI spine, brain without new signal abnormalities to suggest acute flare.  Patient unlikely has MS flare likely this is recrudescence of prior MS symptoms in the setting of acute illness. -Neurology contacted, patient likely has recrudescence of prior MS flare symptoms in the setting of other acute illness without acute flare.  Likely does not need steroids, though could consider treating. -Continue Zeposia daily -  Dalfampridine twice daily -Amantadine twice daily  -Gabapentin 800 mg QID, tizanidine 4 mg nightly, amitriptyline 25-50 mg nightly, baclofen 20 mg 4 times daily as needed -Patient would prefer to be treated for MS flare: We will start 500 mg IV  methylprednisolone for 3 days as pulse dose for possible acute MS flare  #Hypertension Patient has a history of hypertension for which she is on amlodipine and metoprolol. She reports compliance with her medications. She is normotensive at this time. No evidence of active bleed.  - Continue amlodipine 5mg  daily  - Continue metoprolol 50mg  daily   #CKD Stage IIIa Patient has a known history of chronic renal disease. Serum creatinine appears to be at baseline at this time.  - Continue to monitor renal function - Avoid nephrotoxic agents    #Hx of DVT/PE  Patient is on Eliquis daily for a history of bilateral DVT and one PE in 2014. She reports compliance with medications and has not missed any doses. Without evidence of ongoing bleed being cause of patient's anemia, can continue with home dose of Eliquis. - Continue Eliquis 5mg  bid   #Chronic back pain - Continue oxy linked order which approximates home dose  #Depression, Anxiety -Continue Celexa 20 mg daily, BuSpar 15 mg twice daily  Other home medications continued: Methadone, Adderall XR, Protonix 40 mg daily, sucralfate  Diet: Heart healthy VTE: Eliquis IVF: None Code: Full   Prior to Admission Living Arrangement: Home Anticipated Discharge Location: Home Barriers to Discharge: Pending clinical improvement Dispo: Anticipated discharge in approximately 1-2 day(s).   Dawn Denis, MD 11/23/2020, 7:41 AM Pager: [phone] After 5pm on weekdays and 1pm on weekends: On Call pager [phone]

## 2020-11-23 NOTE — Progress Notes (Addendum)
Page sent to Internal Medicine Teaching Service PGY1 pager [phone] covered by Marlou Sa: 5W 17- [facility]- IR here to pick up for biopsy. Pt concerned no one has updated her on plan. Can someone come to bedside? Thx Ivy RN    Patient is stating that no one from her medical team is updating her on her plan. Patient is visibly frustrated by lack of communication.   [id number]: Patient's primary team MD Eulas Post at bedside in addition to Radiology PA to obtain consent for procedure. Patient to go off unit for CT bone marrow biopsy.

## 2020-11-23 NOTE — Evaluation (Signed)
Occupational Therapy Evaluation Patient Details Name: Dawn Peters MRN: [id number] DOB: 1980-07-17 Today's Date: 11/23/2020   History of Present Illness 40 y.o. female presented to [facility] 11/21/20 with left leg weakness, dizziness, and fall. MRI brain, spine negative for acute MS flare. Hgb 5.0; given 2U PRBC with Hgb 7.7   PMH of multiple sclerosis, hypertension history of DVT/PE on Eliquis, and chronic kidney disease   Clinical Impression   Patient is currently requiring assistance with ADLs including minimal assist with toileting, minimal assist with standing LE dressing, minimal assist with bathing and tub transfers, and setup assist with seated grooming and UE dressing, all of which is below patient's typical baseline of being Modified independent with basic ADLs.  During this evaluation, patient was limited by generalized weakness, dizziness when rising from EOB and from toilet, impaired activity tolerance, and pain to LT LE and neck, which has the potential to impact patient's safety and independence during functional mobility, as well as performance for ADLs.  Patient lives with her mother and brother, who are able to provide PRN supervision and assistance.  Patient demonstrates good rehab potential, and should benefit from continued skilled occupational therapy services while in acute care to maximize safety, independence and quality of life at home.  Continued occupational therapy services in the home is recommended.  ?       Recommendations for follow up therapy are one component of a multi-disciplinary discharge planning process, led by the attending physician.  Recommendations may be updated based on patient status, additional functional criteria and insurance authorization.   Follow Up Recommendations  Home health OT    Equipment Recommendations  Tub/shower bench;3 in 1 bedside commode;Other (comment) (RW with 5" wheels)    Recommendations for Other Services        Precautions / Restrictions Precautions Precautions: Fall Precaution Comments: One fall in past year. Does have dizziness. Restrictions Weight Bearing Restrictions: No      Mobility Bed Mobility Overal bed mobility: Modified Independent             General bed mobility comments: HOB elevated for supine <> sit; pt assists LLE up onto bed with bil UEs (as she has done PTA)    Transfers Overall transfer level: Needs assistance Equipment used: Rolling walker (2 wheeled) Transfers: Sit to/from Stand Sit to Stand: Min guard         General transfer comment: Min guard to power up after pt attempted x 2 without assitance. Low bed.    Balance Overall balance assessment: Needs assistance Sitting-balance support: No upper extremity supported;Feet unsupported Sitting balance-Leahy Scale: Good     Standing balance support: Bilateral upper extremity supported Standing balance-Leahy Scale: Poor Standing balance comment: requires bil UE support in standing; LLE shaking, dizziness when rising from toilet and needed to sit back down.                           ADL either performed or assessed with clinical judgement   ADL Overall ADL's : Needs assistance/impaired Eating/Feeding: Independent   Grooming: Wash/dry hands;Min guard   Upper Body Bathing: Min guard;Sitting   Lower Body Bathing: Minimal assistance;Sitting/lateral leans;Sit to/from stand   Upper Body Dressing : Min guard;Sitting   Lower Body Dressing: Sit to/from stand;Minimal assistance Lower Body Dressing Details (indicate cue type and reason): Min Assist to manage shorts. Able to doff and don socks at EOB with supervision. Toilet Transfer: Regular Toilet;Grab bars;Min guard;RW;Ambulation  Toileting- Clothing Manipulation and Hygiene: Minimal assistance Toileting - Clothing Manipulation Details (indicate cue type and reason): Min assist to manage clothing. Pt able to perform own hygiene.   Tub/Shower  Transfer Details (indicate cue type and reason): Pt stated that the new tub/shower in her homw is "high" and her old tub bench broke. Pt requesting all plastic bariatric bench. Functional mobility during ADLs: Min guard;Supervision/safety;Rolling walker       Vision   Vision Assessment?: No apparent visual deficits     Perception     Praxis      Pertinent Vitals/Pain Pain Assessment: Faces Faces Pain Scale: Hurts little more Pain Location: back, left side (chronic), neck Pain Descriptors / Indicators: Aching Pain Intervention(s): Limited activity within patient's tolerance;Monitored during session     Hand Dominance Right   Extremity/Trunk Assessment Upper Extremity Assessment Upper Extremity Assessment: Overall WFL for tasks assessed (intermittent Numbness/tingling from MS. Sensation intact his visit)   Lower Extremity Assessment Lower Extremity Assessment: Defer to PT evaluation   Cervical / Trunk Assessment Cervical / Trunk Exceptions: walks in forward flexed posture due to back pain   Communication Communication Communication: No difficulties   Cognition Arousal/Alertness: Awake/alert Behavior During Therapy: WFL for tasks assessed/performed Overall Cognitive Status: Within Functional Limits for tasks assessed                                     General Comments       Exercises     Shoulder Instructions      Home Living Family/patient expects to be discharged to:: Private residence Living Arrangements: Other relatives;Parent (Mother and brother) Available Help at Discharge: Family;Available PRN/intermittently Type of Home: House Home Access: Level entry     Home Layout: Multi-level;Able to live on main level with bedroom/bathroom     Bathroom Shower/Tub: Teacher, early years/pre: Standard Bathroom Accessibility: Yes   Home Equipment: Cane - single point   Additional Comments: Pt reports that she had a RW and a tub bench  but both were lost opr broken during a move.      Prior Functioning/Environment Level of Independence: Needs assistance  Gait / Transfers Assistance Needed: walks independently; occ use of cane vs walker ADL's / Homemaking Assistance Needed: assist with groceries; does drive; independent with bathing and dressing and all basic ADLs.   Comments: does not work        OT Problem List: Decreased activity tolerance;Impaired balance (sitting and/or standing);Decreased knowledge of use of DME or AE;Pain;Decreased strength      OT Treatment/Interventions: Self-care/ADL training;Therapeutic exercise;Therapeutic activities;Energy conservation;Patient/family education;DME and/or AE instruction;Balance training    OT Goals(Current goals can be found in the care plan section) Acute Rehab OT Goals Patient Stated Goal: Be able to get in and out of high tub at home. home health to help with safe exercises. OT Goal Formulation: With patient Time For Goal Achievement: 12/07/20 Potential to Achieve Goals: Good ADL Goals Pt Will Perform Lower Body Dressing: with adaptive equipment;sitting/lateral leans;sit to/from stand;with modified independence Pt Will Transfer to Toilet: with modified independence;ambulating Pt Will Perform Toileting - Clothing Manipulation and hygiene: with modified independence;with adaptive equipment;sitting/lateral leans;sit to/from stand Pt Will Perform Tub/Shower Transfer: with supervision;Tub transfer;ambulating;tub bench Pt/caregiver will Perform Home Exercise Program: Both right and left upper extremity;With theraband;Independently (Increase endurance) Additional ADL Goal #1: Patient will identify at least 3 energy conservation strategies to employ at home  in order to maximize function and quality of life and decrease caregiver burden while preventing exacerbation of symptoms and rehospitalization.  OT Frequency: Min 2X/week   Barriers to D/C:            Co-evaluation               AM-PAC OT "6 Clicks" Daily Activity     Outcome Measure Help from another person eating meals?: None Help from another person taking care of personal grooming?: A Little Help from another person toileting, which includes using toliet, bedpan, or urinal?: A Little Help from another person bathing (including washing, rinsing, drying)?: A Little Help from another person to put on and taking off regular upper body clothing?: A Little Help from another person to put on and taking off regular lower body clothing?: A Little 6 Click Score: 19   End of Session Equipment Utilized During Treatment: Gait belt;Rolling walker Nurse Communication:  (RN cleared OT to see pt)  Activity Tolerance: Patient tolerated treatment well;Patient limited by fatigue Patient left: in bed;with call bell/phone within reach;with bed alarm set  OT Visit Diagnosis: Unsteadiness on feet (R26.81);Dizziness and giddiness (R42);History of falling (Z91.81)                Time: [id number] OT Time Calculation (min): 41 min Charges:  OT General Charges $OT Visit: 1 Visit OT Evaluation $OT Eval Low Complexity: 1 Low OT Treatments $Self Care/Home Management : 8-22 mins $Therapeutic Activity: 8-22 mins  Anderson Malta, OT Acute Rehab Services Office: [phone] 11/23/2020  Julien Girt 11/23/2020, 2:04 PM

## 2020-11-23 NOTE — Progress Notes (Signed)
Events noted overnight.  Laboratory data also reviewed.  Quantitative immunoglobulins are all within normal range which goes against plasma cell disorder.  Her total protein is low at 5.6 but creatinine is at baseline.  Her hemoglobin is currently 7.1 with slight decrease in her white cell count although the neutrophil percentage is adequate.   Based on these findings, there is no clear-cut explanation for her anemia.  Disorder such as aplastic anemia remains high on the differential.  I would recommend proceeding with a bone marrow biopsy.  Please consult interventional radiology as soon as possible to perform bone marrow biopsy and aspirate.  I prefer this to be done as soon as possible to expedite her diagnosis and management.   We will continue to follow.

## 2020-11-24 LAB — TYPE AND SCREEN
ABO/RH(D): B POS
Antibody Screen: POSITIVE
DAT, IgG: POSITIVE
Donor AG Type: NEGATIVE
Donor AG Type: NEGATIVE
PT AG Type: NEGATIVE
Unit division: 0
Unit division: 0
Unit division: 0
Unit division: 0
Unit division: 0
Unit division: 0
Unit division: 0

## 2020-11-24 LAB — CBC WITH DIFFERENTIAL/PLATELET
Abs Immature Granulocytes: 0.03 10*3/uL (ref 0.00–0.07)
Basophils Absolute: 0 10*3/uL (ref 0.0–0.1)
Basophils Relative: 0 %
Eosinophils Absolute: 0 10*3/uL (ref 0.0–0.5)
Eosinophils Relative: 0 %
HCT: 21.4 % — ABNORMAL LOW (ref 36.0–46.0)
Hemoglobin: 7.5 g/dL — ABNORMAL LOW (ref 12.0–15.0)
Immature Granulocytes: 1 %
Lymphocytes Relative: 4 %
Lymphs Abs: 0.1 10*3/uL — ABNORMAL LOW (ref 0.7–4.0)
MCH: 32.1 pg (ref 26.0–34.0)
MCHC: 35 g/dL (ref 30.0–36.0)
MCV: 91.5 fL (ref 80.0–100.0)
Monocytes Absolute: 0 10*3/uL — ABNORMAL LOW (ref 0.1–1.0)
Monocytes Relative: 1 %
Neutro Abs: 2.7 10*3/uL (ref 1.7–7.7)
Neutrophils Relative %: 94 %
Platelets: 236 10*3/uL (ref 150–400)
RBC: 2.34 MIL/uL — ABNORMAL LOW (ref 3.87–5.11)
RDW: 14.1 % (ref 11.5–15.5)
WBC: 2.9 10*3/uL — ABNORMAL LOW (ref 4.0–10.5)
nRBC: 0 % (ref 0.0–0.2)

## 2020-11-24 LAB — BASIC METABOLIC PANEL
Anion gap: 12 (ref 5–15)
BUN: 17 mg/dL (ref 6–20)
CO2: 22 mmol/L (ref 22–32)
Calcium: 9.1 mg/dL (ref 8.9–10.3)
Chloride: 101 mmol/L (ref 98–111)
Creatinine, Ser: 1.51 mg/dL — ABNORMAL HIGH (ref 0.44–1.00)
GFR, Estimated: 44 mL/min — ABNORMAL LOW (ref >60)
Glucose, Bld: 204 mg/dL — ABNORMAL HIGH (ref 70–99)
Potassium: 4.2 mmol/L (ref 3.5–5.1)
Sodium: 135 mmol/L (ref 135–145)

## 2020-11-24 LAB — BPAM RBC
Blood Product Expiration Date: 202209262359
Blood Product Expiration Date: 202210052359
Blood Product Expiration Date: 202210122359
Blood Product Expiration Date: 202210122359
Blood Product Expiration Date: 202210172359
Blood Product Expiration Date: 202210212359
Blood Product Expiration Date: 202210242359
ISSUE DATE / TIME: 202209170637
ISSUE DATE / TIME: 202209171029
Unit Type and Rh: 5100
Unit Type and Rh: 5100
Unit Type and Rh: 7300
Unit Type and Rh: 7300
Unit Type and Rh: 7300
Unit Type and Rh: 7300
Unit Type and Rh: 9500

## 2020-11-24 LAB — PATHOLOGIST SMEAR REVIEW

## 2020-11-24 LAB — ANCA TITERS
Atypical P-ANCA titer: <1:20 {titer}
C-ANCA: <1:20 {titer}
P-ANCA: <1:20 {titer}

## 2020-11-24 LAB — PARVOVIRUS B19 ANTIBODY, IGG AND IGM
Parovirus B19 IgG Abs: 6.4 index — ABNORMAL HIGH (ref 0.0–0.8)
Parovirus B19 IgM Abs: 0.1 index (ref 0.0–0.8)

## 2020-11-24 LAB — RHEUMATOID FACTOR: Rheumatoid fact SerPl-aCnc: <10 IU/mL (ref <14.0)

## 2020-11-24 MED ORDER — DIPHENHYDRAMINE HCL 25 MG PO CAPS
50.0000 mg | ORAL_CAPSULE | Freq: Four times a day (QID) | ORAL | Status: DC | PRN
  Administered 2020-11-24 – 2020-11-26 (×6): 50 mg via ORAL
  Filled 2020-11-24 (×6): qty 2

## 2020-11-24 MED ORDER — POLYETHYLENE GLYCOL 3350 17 G PO PACK
17.0000 g | PACK | Freq: Two times a day (BID) | ORAL | Status: DC
  Administered 2020-11-24 – 2020-11-25 (×2): 17 g via ORAL
  Filled 2020-11-24 (×5): qty 1

## 2020-11-24 NOTE — Progress Notes (Signed)
   11/24/20 1232  Assess: MEWS Score  Temp 99.4 F (37.4 C)  BP 125/73  ECG Heart Rate (!) 114  Resp 20  SpO2 98 %  Assess: MEWS Score  MEWS Temp 0  MEWS Systolic 0  MEWS Pulse 2  MEWS RR 0  MEWS LOC 0  MEWS Score 2  MEWS Score Color Yellow  Assess: if the MEWS score is Yellow or Red  Were vital signs taken at a resting state? Yes  Focused Assessment No change from prior assessment  Early Detection of Sepsis Score *See Row Information* Low  MEWS guidelines implemented *See Row Information* Yes  Treat  MEWS Interventions Administered scheduled meds/treatments  Pain Scale 0-10  Pain Score 3  Pain Type Chronic pain  Pain Location Back  Pain Intervention(s) Rest  Patients response to intervention Unchanged  Take Vital Signs  Increase Vital Sign Frequency  Yellow: Q 2hr X 2 then Q 4hr X 2, if remains yellow, continue Q 4hrs  Escalate  MEWS: Escalate Yellow: discuss with charge nurse/RN and consider discussing with provider and RRT  Notify: Charge Nurse/RN  Name of Charge Nurse/RN Notified Precious Gilding, RN  Date Charge Nurse/RN Notified 11/24/20  Time Charge Nurse/RN Notified 1232

## 2020-11-24 NOTE — Progress Notes (Addendum)
Subjective:   Patient reports that her left lower leg weakness has improved with the initiation of steroids.  She was able to walk with PT and states she felt much stronger. She denies any chest pain, shortness of breath, or dizziness.  She is able to eat and drink without any difficulty.    Objective:  Vital signs in last 24 hours: Vitals:   11/24/20 0753 11/24/20 1232 11/24/20 1333 11/24/20 1545  BP: 124/81 125/73 122/77 124/83  Pulse:   (!) 115 (!) 108  Resp: _0 Temp: 98.1 F (36.7 C) 99.4 F (37.4 C) 99.1 F (37.3 C) 98.6 F (37 C)  TempSrc: Oral Oral Oral Oral  SpO2: 100% 98% 100% 100%  Weight:      Height:       Gen:  CV: tachycardic, no m/r/g Pulm: Non labored breathing on RA Abd: Soft, NT, ND Ext: No edema of BLE, warm and dry Neuro: A&Ox3, no focal deficits   Assessment/Plan:  Active Problems:   Symptomatic anemia  Dawn Peters is a 40 y.o. with pertinent PMH of multiple sclerosis, DVT/PE on Eliquis, hypertension, chronic renal disease who presented with left leg weakness and dizziness and admitted for symptomatic anemia on hospital day 2    #Symptomatic normocytic anemia, likely hypoproliferative Patient reported 1wk of malaise, headaches, lightheadedness, dizziness.  Hemoglobin of 5.0 on admission without evidence of acute bleed or active hemolysis.  Low absolute reticulocyte count.  Normal iron 135.  Ferritin 556.  Vitamin B12 normal.  Folate mildly low 3.7.  Prelim smear ovalocytes.  Warm Ab on type and screen.  No active bleeding with normal iron levels.  Bilirubin normal.  Kappa free light chain elevated though ratio normal. ANA negative Concern for hypoproliferative process given low reticulocyte count.  CBC remaining stable~7-8. ANA negative.  -Hematology consulted, appreciate recs -Neurology reports [name] can cause lymphopenia less likely anemia. -F/U bone marrow biopsy results -F/U RF, ANCA titers -F/U SPEP unremarkable, though  patient noted to have elevated kappa however K/lambda ratio WNL, UPEP UIFE pending  -F/U pathology smear review -F/U FOBT  -Folic acid supplement -Trend CBC  #Left lower extremity weakness #Multiple sclerosis  Patient has a history of MS for which she follows with Dr Felecia Shelling; last OV 09/21/20.  Reported left lower extremity weakness with prior flares.  She presented to ED with left lower extremity weakness with concern for MS flare.  MRI spine, brain without new signal abnormalities to suggest acute flare.  Patient unlikely has MS flare  more likely this is recrudescence of prior MS symptoms in the setting of acute illness. She could also have disease not perceived by MRI. She reports that after starting IV methylprednisolone her symptoms have improved drastically.  -Continue Zeposia daily -Continue IV methylpred 533m d2/3 -Dalfampridine twice daily -Amantadine twice daily  -Gabapentin 800 mg QID, tizanidine 4 mg nightly, amitriptyline 25-50 mg nightly, baclofen 20 mg 4 times daily as needed  Hypertension Patient has a history of hypertension for which she is on amlodipine and metoprolol. She reports compliance with her medications. She is normotensive at this time.  - Continue amlodipine 522mdaily  - Continue metoprolol 5053maily   #CKD Stage IIIa Patient has a known history of chronic renal disease. Serum creatinine appears to be at baseline at this time.  - Continue to monitor renal function - Avoid nephrotoxic agents    #Hx of DVT/PE  Patient is on Eliquis daily for a history of bilateral  DVT and one PE in 2014. She reports compliance with medications and has not missed any doses. Without evidence of ongoing bleed being cause of patient's anemia, can continue with home dose of Eliquis. - Continue Eliquis 25m bid   #Chronic back pain - Continue oxy linked order which approximates home dose  #Depression, Anxiety -Continue Celexa 20 mg daily, BuSpar 15 mg twice daily  Other home  medications continued: Methadone, Adderall XR, Protonix 40 mg daily, sucralfate   Diet: Heart healthy VTE: Eliquis IVF: None Code: Full  CRick Duff MD 11/24/2020, 4:12 PM Pager: [phone] 5pm on weekdays and 1pm on weekends: On Call pager [phone]

## 2020-11-24 NOTE — Progress Notes (Signed)
Physical Therapy Treatment Patient Details Name: Dawn Peters MRN: [id number] DOB: [birth year] Today's Date: 11/24/2020   History of Present Illness 40 y.o. female presented to [facility] 11/21/20 with left leg weakness, dizziness, and fall. MRI brain, spine negative for acute MS flare. Hgb 5.0; given 2U PRBC with Hgb 7.7   PMH of multiple sclerosis, hypertension history of DVT/PE on Eliquis, and chronic kidney disease    PT Comments    Patient reports strength much improved with IV steroids started 9/19. Functionally she looks much stronger and safer with gait. Has chronic Left foot drop with steppage gait and since she does not have her brace here, she needs to use RW.  Provided therabands for her to use for exercise while hospitalized. Goals met and updated.   Recommendations for follow up therapy are one component of a multi-disciplinary discharge planning process, led by the attending physician.  Recommendations may be updated based on patient status, additional functional criteria and insurance authorization.  Follow Up Recommendations  Outpatient PT     Equipment Recommendations  Rolling walker with 5" wheels (handle on current walker is broken)    Recommendations for Other Services OT consult     Precautions / Restrictions Precautions Precautions: Fall Precaution Comments: One fall in past year. Does have dizziness. Restrictions Weight Bearing Restrictions: No     Mobility  Bed Mobility Overal bed mobility: Modified Independent             General bed mobility comments: HOB elevated for supine <> sit; pt assists LLE up onto bed with bil UEs (as she has done PTA)    Transfers Overall transfer level: Needs assistance Equipment used: Rolling walker (2 wheeled) Transfers: Sit to/from Stand Sit to Stand: Modified independent (Device/Increase time)            Ambulation/Gait Ambulation/Gait assistance: Supervision;Modified independent (Device/Increase  time) Gait Distance (Feet): 200 Feet Assistive device: Rolling walker (2 wheeled) Gait Pattern/deviations: Step-through pattern;Decreased dorsiflexion - left;Steppage Gait velocity: decreased   General Gait Details: pt normally uses brace for left foot drop (not here) and compensating wiht steppage gait; much improved endurance and safety with ambulation   Stairs             Wheelchair Mobility    Modified Rankin (Stroke Patients Only)       Balance Overall balance assessment: Needs assistance Sitting-balance support: No upper extremity supported;Feet unsupported Sitting balance-Leahy Scale: Good     Standing balance support: Bilateral upper extremity supported Standing balance-Leahy Scale: Fair Standing balance comment: able to stand without UE support                            Cognition Arousal/Alertness: Awake/alert Behavior During Therapy: WFL for tasks assessed/performed Overall Cognitive Status: Within Functional Limits for tasks assessed                                        Exercises Other Exercises Other Exercises: Educated to do bridges when in bed; LAQ when sitting, seated marching; provided 3 levels of theraband for pt to do her HEP she does for UE and LEs. Told her NOT to do squats at counter by herself yet    General Comments        Pertinent Vitals/Pain Pain Assessment: Faces Faces Pain Scale: No hurt    Home Living  Prior Function            PT Goals (current goals can now be found in the care plan section) Acute Rehab PT Goals Patient Stated Goal: Be able to get in and out of high tub at home. home health to help with safe exercises. PT Goal Formulation: With patient Time For Goal Achievement: 12/08/20 Potential to Achieve Goals: Good Progress towards PT goals: Goals met and updated - see care plan    Frequency    Min 3X/week      PT Plan Current plan remains  appropriate    Co-evaluation              AM-PAC PT "6 Clicks" Mobility   Outcome Measure  Help needed turning from your back to your side while in a flat bed without using bedrails?: None Help needed moving from lying on your back to sitting on the side of a flat bed without using bedrails?: None Help needed moving to and from a bed to a chair (including a wheelchair)?: None Help needed standing up from a chair using your arms (e.g., wheelchair or bedside chair)?: None Help needed to walk in hospital room?: None Help needed climbing 3-5 steps with a railing? : A Lot 6 Click Score: 22    End of Session   Activity Tolerance: Patient tolerated treatment well Patient left: with call bell/phone within reach;in chair Nurse Communication: Mobility status;Other (comment) (does ok walking to bathroom with IV pole) PT Visit Diagnosis: Muscle weakness (generalized) (M62.81);History of falling (Z91.81);Other abnormalities of gait and mobility (R26.89)     Time: 8168-3870 PT Time Calculation (min) (ACUTE ONLY): 23 min  Charges:  $Gait Training: 23-37 mins                      Arby Barrette, PT Pager [phone]    Rexanne Mano 11/24/2020, 1:29 PM

## 2020-11-24 NOTE — Care Management Important Message (Signed)
Important Message  Patient Details  Name: Dawn Peters MRN: [id number] Date of Birth: 06-17-1980   Medicare Important Message Given:  Yes     Orbie Pyo 11/24/2020, 3:41 PM

## 2020-11-25 DIAGNOSIS — D509 Iron deficiency anemia, unspecified: Secondary | ICD-10-CM | POA: Diagnosis not present

## 2020-11-25 DIAGNOSIS — G35 Multiple sclerosis: Secondary | ICD-10-CM | POA: Diagnosis not present

## 2020-11-25 DIAGNOSIS — D649 Anemia, unspecified: Secondary | ICD-10-CM | POA: Diagnosis not present

## 2020-11-25 LAB — BASIC METABOLIC PANEL
Anion gap: 12 (ref 5–15)
BUN: 27 mg/dL — ABNORMAL HIGH (ref 6–20)
CO2: 21 mmol/L — ABNORMAL LOW (ref 22–32)
Calcium: 9.2 mg/dL (ref 8.9–10.3)
Chloride: 105 mmol/L (ref 98–111)
Creatinine, Ser: 1.74 mg/dL — ABNORMAL HIGH (ref 0.44–1.00)
GFR, Estimated: 37 mL/min — ABNORMAL LOW (ref >60)
Glucose, Bld: 156 mg/dL — ABNORMAL HIGH (ref 70–99)
Potassium: 4 mmol/L (ref 3.5–5.1)
Sodium: 138 mmol/L (ref 135–145)

## 2020-11-25 LAB — CBC
HCT: 20.8 % — ABNORMAL LOW (ref 36.0–46.0)
Hemoglobin: 7.3 g/dL — ABNORMAL LOW (ref 12.0–15.0)
MCH: 32.4 pg (ref 26.0–34.0)
MCHC: 35.1 g/dL (ref 30.0–36.0)
MCV: 92.4 fL (ref 80.0–100.0)
Platelets: 255 10*3/uL (ref 150–400)
RBC: 2.25 MIL/uL — ABNORMAL LOW (ref 3.87–5.11)
RDW: 14.3 % (ref 11.5–15.5)
WBC: 7.9 10*3/uL (ref 4.0–10.5)
nRBC: 0 % (ref 0.0–0.2)

## 2020-11-25 LAB — SURGICAL PATHOLOGY

## 2020-11-25 MED ORDER — CALCIUM CARBONATE ANTACID 500 MG PO CHEW
2.0000 | CHEWABLE_TABLET | Freq: Four times a day (QID) | ORAL | Status: DC | PRN
  Filled 2020-11-25: qty 2

## 2020-11-25 MED ORDER — PANTOPRAZOLE SODIUM 40 MG PO TBEC
40.0000 mg | DELAYED_RELEASE_TABLET | Freq: Two times a day (BID) | ORAL | Status: DC
  Administered 2020-11-25 – 2020-11-27 (×5): 40 mg via ORAL
  Filled 2020-11-25 (×5): qty 1

## 2020-11-25 NOTE — Progress Notes (Signed)
Subjective: Patient endorsing constipation overnight requesting MiraLAX.  Seen this morning on rounds.  Patient endorses improvement in lower extremity strength on steroids.  Is having heartburn.  Denies dizziness at rest.  Denies dizziness while walking with PT and working with OT.  Discussed with patient option of 1 additional blood transfusion with discharge home with close hematology follow-up.  Patient strongly preferred to stay in the hospital until bone marrow biopsy result is released.  Objective:  Vital signs in last 24 hours: Vitals:   11/24/20 2045 11/24/20 2344 11/25/20 0355 11/25/20 0804  BP: 140/85 115/68 123/83 118/69  Pulse:    90  Resp: 18 18 18 18   Temp: 98.7 F (37.1 C) 98.5 F (36.9 C) 98.4 F (36.9 C) 98.2 F (36.8 C)  TempSrc: Oral Oral Oral Oral  SpO2: 98% 95% 95% 96%  Weight:      Height:       Physical Exam: General: Well appearing African-American female, NAD HENT: normocephalic, atraumatic EYES: conjunctiva non-erythematous, no scleral icterus CV: regular rate, normal rhythm, no murmurs, rubs, gallops. Pulmonary: sating well on RA, lung clear to auscultation, no rales, wheezes, rhonchi Abdominal: non-distended, soft, non-tender to palpation, normal BS Skin: Warm and dry, no rashes or lesions Neurological: MS: awake, alert and oriented x3, normal speech and fund of knowledge Motor: moves all extremities antigravity Psych: normal affect    Assessment/Plan:  Active Problems:   Symptomatic anemia  Dawn Peters is a 40 y.o. with pertinent PMH of multiple sclerosis, DVT/PE on Eliquis, hypertension, chronic renal disease who presented with left leg weakness and dizziness and admitted for symptomatic anemia on hospital day 4     #Symptomatic normocytic anemia, likely hypoproliferative Patient reported 1wk of malaise, headaches, lightheadedness, dizziness.  Hemoglobin of 5.0 on admission without evidence of acute bleed or active hemolysis.  Concern for hypoproliferative process given low reticulocyte count.  CBC remaining stable~7-8.  Work-up has been negative. -Hematology consulted, appreciate recs -Neurology reports Dawn Peters can cause lymphopenia less likely anemia. -F/U bone marrow biopsy results  -F/U FOBT  -Folic acid supplement -Trend CBC  #Left lower extremity weakness #Multiple sclerosis  Patient has a history of MS for which she follows with Dr Dawn Peters; last OV 09/21/20.  Reported left lower extremity weakness with prior flares.  She presented to ED with left lower extremity weakness with concern for MS flare.  MRI spine, brain without new signal abnormalities to suggest acute flare.  Patient unlikely has MS flare, more likely this is recrudescence of prior MS symptoms in the setting of acute illness. She could also have disease not perceived by MRI. She reports that after starting IV methylprednisolone her symptoms have improved.  -Continue Zeposia daily -Complete IV methylpred 500mg  d3/3 -Dalfampridine twice daily -Amantadine twice daily  -Gabapentin 800 mg QID, tizanidine 4 mg nightly, amitriptyline 25-50 mg nightly, baclofen 20 mg 4 times daily as needed  Hypertension Patient has a history of hypertension for which she is on amlodipine and metoprolol. She reports compliance with her medications. She is normotensive at this time.  - Continue amlodipine 5mg  daily  - Continue metoprolol 50mg  daily   #CKD Stage IIIa Patient has a known history of chronic renal disease. Serum creatinine appears to be at baseline at this time.  - Continue to monitor renal function - Avoid nephrotoxic agents    #Hx of DVT/PE  Patient is on Eliquis daily for a history of bilateral DVT and one PE in 2014. She reports compliance with medications  and has not missed any doses. Without evidence of ongoing bleed being cause of patient's anemia, can continue with home dose of Eliquis. - Continue Eliquis $RemoveBeforeDE'5mg'oCnPrFHGnIFADsj$  bid   #Chronic back pain - Continue  oxy linked order which approximates home dose  #Depression, Anxiety -Continue Celexa 20 mg daily, BuSpar 15 mg twice daily  Other home medications continued: Methadone, Adderall XR, Protonix 40 mg daily, sucralfate   Diet: Heart healthy VTE: Eliquis IVF: None Code: Dawn Mo, MD 11/25/2020, 11:24 AM Pager: [phone] After 5pm on weekdays and 1pm on weekends: On Call pager [phone]

## 2020-11-25 NOTE — TOC Initial Note (Signed)
Transition of Care [facility]) - Initial/Assessment Note    Patient Details  Name: Dawn Peters MRN: [id number] Date of Birth: March 05, 1981  Transition of Care [facility]) CM/SW Contact:    Carles Collet, RN Phone Number: 11/25/2020, 11:43 AM  Clinical Narrative:              [name] w patient at bedside to discuss dispo plans, resources, and needs. Reviewed PT recommendations for outpatient follow up and patient is agreeable. We discussed locations of [facility] outpatient rehab centers and she would like referral sent to [facility]. Referral has been sent electronically. Patient understands to call for appointment at DC, information has been added to AVS. Patient requests RW and 3/1. Orders have been placed and [employer] will deliver to room today.  No other DC needs identified at this time.       Expected Discharge Plan: Home/Self Care Barriers to Discharge: Continued Medical Work up   Patient Goals and CMS Choice Patient states their goals for this hospitalization and ongoing recovery are:: to go home   Choice offered to / list presented to : NA  Expected Discharge Plan and Services Expected Discharge Plan: Home/Self Care   Discharge Planning Services: CM Consult Post Acute Care Choice: Durable Medical Equipment Living arrangements for the past 2 months: Single Family Home                 DME Arranged: 3-N-1, Walker rolling DME Agency: [employer] Date DME Agency Contacted: 11/25/20 Time DME Agency Contacted: [phone] Representative spoke with at DME Agency: Sue Lush            Prior Living Arrangements/Services Living arrangements for the past 2 months: Single Family Home Lives with:: Parents, Siblings                   Activities of Daily Living Home Assistive Devices/Equipment: Cane (specify quad or straight) ADL Screening (condition at time of admission) Patient's cognitive ability adequate to safely complete daily activities?: Yes Is the patient deaf or have difficulty  hearing?: No Does the patient have difficulty seeing, even when wearing glasses/contacts?: No Does the patient have difficulty concentrating, remembering, or making decisions?: No Patient able to express need for assistance with ADLs?: Yes Does the patient have difficulty dressing or bathing?: No Independently performs ADLs?: Yes (appropriate for developmental age) Does the patient have difficulty walking or climbing stairs?: Yes Weakness of Legs: Both (from [place] per pt) Weakness of Arms/Hands: Both (from [place] per pt)  Permission Sought/Granted                  Emotional Assessment              Admission diagnosis:  Left leg weakness [R29.898] Symptomatic anemia [D64.9] Patient Active Problem List   Diagnosis Date Noted   Symptomatic anemia 11/21/2020   Amenorrhea 11/20/2018   Well woman exam 11/20/2018   MS (multiple sclerosis) ([place]) 03/14/2018   Stage 3 chronic kidney disease ([place])    Obesity (BMI 30.0-34.9)    Anemia 01/09/2018   Pulmonary embolism ([place]) 09/18/2017   Elevated serum creatinine 05/12/2017   Acute encephalopathy 04/20/2017   Hydronephrosis of left kidney 10/24/2016   Attention deficit 10/24/2016   Left leg weakness 08/13/2016   Chronic pain 08/13/2016   Mild renal insufficiency 08/13/2016   Left-sided weakness    High risk medication use 09/15/2015   Hip pain, bilateral 07/28/2015   Urinary hesitancy 07/28/2015   Multiple sclerosis exacerbation ([place]) 07/17/2015  Urinary disorder 06/11/2015   Gait disturbance 05/19/2015   Numbness 05/19/2015   Depression with anxiety 05/13/2015   Other fatigue 05/13/2015   Left knee pain 05/13/2015   Avascular necrosis of bones of both hips ([place]) 05/13/2015   Screening for HIV (human immunodeficiency virus) 07/08/2014   Essential hypertension, benign 11/19/2013   Anxiety state, unspecified 11/19/2013   Screen for STD (sexually transmitted disease) 11/01/2013   Encounter for routine gynecological examination  11/01/2013   Oral contraceptive use 10/20/2011   Multiple sclerosis ([place]) 08/08/2007   RASH AND OTHER NONSPECIFIC SKIN ERUPTION 06/22/2007   AVASCULAR NECROSIS, FEMORAL HEAD 03/11/2006   Essential hypertension 02/03/2006   MICROALBUMINURIA 02/03/2006   DVT, HX OF 02/03/2006   PCP:  [name], PA-C Pharmacy:   [id number] [address] [place] [address] Phone: [phone] Fax: [phone]  [facility] [place], [place] [address] [place] [address] Phone: [phone] Fax: [phone]     Social Determinants of Health (SDOH) Interventions    Readmission Risk Interventions No flowsheet data found.

## 2020-11-26 ENCOUNTER — Other Ambulatory Visit: Payer: Self-pay | Admitting: Oncology

## 2020-11-26 DIAGNOSIS — N189 Chronic kidney disease, unspecified: Secondary | ICD-10-CM | POA: Diagnosis not present

## 2020-11-26 DIAGNOSIS — B343 Parvovirus infection, unspecified: Secondary | ICD-10-CM | POA: Diagnosis not present

## 2020-11-26 DIAGNOSIS — D649 Anemia, unspecified: Secondary | ICD-10-CM | POA: Diagnosis not present

## 2020-11-26 DIAGNOSIS — G35 Multiple sclerosis: Secondary | ICD-10-CM | POA: Diagnosis not present

## 2020-11-26 DIAGNOSIS — D509 Iron deficiency anemia, unspecified: Secondary | ICD-10-CM | POA: Diagnosis not present

## 2020-11-26 LAB — BASIC METABOLIC PANEL
Anion gap: 8 (ref 5–15)
BUN: 32 mg/dL — ABNORMAL HIGH (ref 6–20)
CO2: 25 mmol/L (ref 22–32)
Calcium: 9 mg/dL (ref 8.9–10.3)
Chloride: 105 mmol/L (ref 98–111)
Creatinine, Ser: 1.58 mg/dL — ABNORMAL HIGH (ref 0.44–1.00)
GFR, Estimated: 42 mL/min — ABNORMAL LOW (ref >60)
Glucose, Bld: 151 mg/dL — ABNORMAL HIGH (ref 70–99)
Potassium: 3.6 mmol/L (ref 3.5–5.1)
Sodium: 138 mmol/L (ref 135–145)

## 2020-11-26 LAB — CBC WITH DIFFERENTIAL/PLATELET
Abs Immature Granulocytes: 0.08 10*3/uL — ABNORMAL HIGH (ref 0.00–0.07)
Basophils Absolute: 0 10*3/uL (ref 0.0–0.1)
Basophils Relative: 0 %
Eosinophils Absolute: 0 10*3/uL (ref 0.0–0.5)
Eosinophils Relative: 0 %
HCT: 20.7 % — ABNORMAL LOW (ref 36.0–46.0)
Hemoglobin: 7 g/dL — ABNORMAL LOW (ref 12.0–15.0)
Immature Granulocytes: 1 %
Lymphocytes Relative: 1 %
Lymphs Abs: 0.1 10*3/uL — ABNORMAL LOW (ref 0.7–4.0)
MCH: 31.7 pg (ref 26.0–34.0)
MCHC: 33.8 g/dL (ref 30.0–36.0)
MCV: 93.7 fL (ref 80.0–100.0)
Monocytes Absolute: 0.6 10*3/uL (ref 0.1–1.0)
Monocytes Relative: 7 %
Neutro Abs: 7.2 10*3/uL (ref 1.7–7.7)
Neutrophils Relative %: 91 %
Platelets: 277 10*3/uL (ref 150–400)
RBC: 2.21 MIL/uL — ABNORMAL LOW (ref 3.87–5.11)
RDW: 14.2 % (ref 11.5–15.5)
WBC: 7.9 10*3/uL (ref 4.0–10.5)
nRBC: 0 % (ref 0.0–0.2)

## 2020-11-26 LAB — PREPARE RBC (CROSSMATCH)

## 2020-11-26 MED ORDER — DARBEPOETIN ALFA 40 MCG/0.4ML IJ SOSY: 40.0000 ug | PREFILLED_SYRINGE | INTRAMUSCULAR | 0 refills | Status: DC

## 2020-11-26 MED ORDER — SODIUM CHLORIDE 0.9% IV SOLUTION: Freq: Once | INTRAVENOUS | Status: DC

## 2020-11-26 MED ORDER — DARBEPOETIN ALFA 40 MCG/0.4ML IJ SOSY
40.0000 ug | PREFILLED_SYRINGE | INTRAMUSCULAR | Status: DC
  Administered 2020-11-26: 40 ug via SUBCUTANEOUS
  Filled 2020-11-26: qty 0.4

## 2020-11-26 MED ORDER — PREDNISONE 10 MG PO TABS: ORAL_TABLET | ORAL | 0 refills | Status: AC

## 2020-11-26 MED ORDER — DARBEPOETIN ALFA 40 MCG/0.4ML IJ SOSY
40.0000 ug | PREFILLED_SYRINGE | INTRAMUSCULAR | Status: DC
  Filled 2020-11-26: qty 0.4

## 2020-11-26 NOTE — Progress Notes (Signed)
The results of the bone marrow biopsy were personally reviewed and discussed with the patient today.  There are no clear-cut abnormalities noted on her bone marrow.  There is no infiltrative bone marrow process, or overt fibrosis or red cell aplasia.  The etiology of her anemia remains unclear.  Could be a secondary to autoimmune process, infection or idiopathic process.  From a management standpoint, I recommend continued supportive management at this time.  Given her chronic renal insufficiency, she would benefit from growth factor support in the form of Retacrit or Aranesp.  This can be started the during this hospitalization and will be continued outpatient.  I have no objections to discharge from a hematology standpoint.  We will arrange follow-up for her at the cancer center for further management and continuing growth factor support. 

## 2020-11-26 NOTE — Progress Notes (Signed)
IP PROGRESS NOTE  Subjective:   Dawn Peters reports feeling well at this time without any major complaints.  She is able to ambulate without any major difficulties.  She has not no recent falls or syncope.  She denies any chest pain or shortness of breath.  She denies any difficulty breathing.  Objective:  Vital signs in last 24 hours: Temp:  [98 F (36.7 C)-99 F (37.2 C)] 98.1 F (36.7 C) (09/22 1100) Pulse Rate:  [80-94] 93 (09/22 1100) Resp:  [16-18] 18 (09/22 1100) BP: (110-120)/(69-74) 113/71 (09/22 1100) SpO2:  [98 %-100 %] 100 % (09/22 1100) Weight change:  Last BM Date: 11/25/20  Intake/Output from previous day: 09/21 0701 - 09/22 0700 In: 949 [P.O.:840; IV Piggyback:109] Out: -  General: Alert, awake without distress. Head: Normocephalic atraumatic. Mouth: mucous membranes moist, pharynx normal without lesions Eyes: No scleral icterus.  Pupils are equal and round reactive to light. Resp: clear to auscultation bilaterally without rhonchi or wheezes or dullness to percussion. Cardio: regular rate and rhythm, S1, S2 normal, no murmur, click, rub or gallop GI: soft, non-tender; bowel sounds normal; no masses,  no organomegaly Musculoskeletal: No joint deformity or effusion. Neurological: No motor, sensory deficits.  Intact deep tendon reflexes. Skin: No rashes or lesions.    Lab Results: Recent Labs    11/25/20 0115 11/26/20 0021  WBC 7.9 7.9  HGB 7.3* 7.0*  HCT 20.8* 20.7*  PLT 255 277    BMET Recent Labs    11/25/20 0115 11/26/20 0021  NA 138 138  K 4.0 3.6  CL 105 105  CO2 21* 25  GLUCOSE 156* 151*  BUN 27* 32*  CREATININE 1.74* 1.58*  CALCIUM 9.2 9.0    Studies/Results: No results found.  Medications: I have reviewed the patient's current medications.  Assessment/Plan:  40 year old with:  1.  Anemia presented acutely in September 2022 without any signs symptoms of bleeding.  Her work-up showed chronic renal insufficiency as well as a  positive parvovirus titers but no other clear-cut etiology.  Bone marrow biopsy obtained on November 23, 2020 was personally discussed with the patient which showed no clear-cut pathology.  The differential diagnosis for her anemia was discussed at this time.  Viral infection versus autoimmune etiology versus myelofibrosis are all considerations.  At this time, I recommend continued supportive management.  I agree with 1 unit of packed red cell transfusion today and we we will I will arrange follow-up for further management.  Growth factor support could also be a possibility treating her anemia given her renal failure.  These items discussed with the patient today in detail and all of her questions were answered to her satisfaction.  2.  Disposition: We will arrange follow-up in the next week to follow-up on her laboratory testing and possible proceeding with growth factor support.   35  minutes were spent on this encounter.  Time was dedicated to reviewing pathology results, laboratory data, treatment options and the future plan of care discussion.    LOS: 5 days   Dawn Peters 11/26/2020, 2:07 PM

## 2020-11-26 NOTE — Discharge Summary (Addendum)
Name: Dawn Peters MRN: [id number] DOB: [birth year] 40 y.o. PCP: [name], PA-C  Date of Admission: 11/20/2020  6:03 PM Date of Discharge: 11/26/20 Attending Physician: Sid Falcon, MD  Discharge Diagnosis: 1. Symptomatic normocytic anemia, likely hypoproliferative 2. Left lower extremity weakness  Discharge Medications: Allergies as of 11/27/2020       Reactions   Ace Inhibitors Swelling   Amoxicillin Itching   Did it involve swelling of the face/tongue/throat, SOB, or low BP? Yes Did it involve sudden or severe rash/hives, skin peeling, or any reaction on the inside of your mouth or nose? No Did you need to seek medical attention at a hospital or doctor's office? No When did it last happen?      unknown  If all above answers are "NO", may proceed with cephalosporin use.;   Lisinopril Swelling   Lower lip swelling        Medication List     STOP taking these medications    methylPREDNISolone 4 MG tablet Commonly known as: MEDROL   methylPREDNISolone 4 MG Tbpk tablet Commonly known as: MEDROL DOSEPAK   pantoprazole 20 MG tablet Commonly known as: PROTONIX       TAKE these medications    acetaminophen 500 MG tablet Commonly known as: TYLENOL Take 1,000 mg by mouth every 6 (six) hours as needed for moderate pain or headache.   amantadine 100 MG capsule Commonly known as: SYMMETREL Take 100 mg by mouth 2 (two) times daily.   amitriptyline 25 MG tablet Commonly known as: ELAVIL TAKE 1 TO 2 TABLETS(25 TO 50 MG) BY MOUTH AT BEDTIME What changed: See the new instructions.   amLODipine 5 MG tablet Commonly known as: NORVASC Take 1 tablet (5 mg total) by mouth daily. What changed: when to take this   amphetamine-dextroamphetamine 20 MG 24 hr capsule Commonly known as: Adderall XR Take 1 capsule (20 mg total) by mouth daily.   apixaban 5 MG Tabs tablet Commonly known as: ELIQUIS Take 1 tablet (5 mg total) by mouth 2 (two) times  daily.   baclofen 20 MG tablet Commonly known as: LIORESAL Take 1 tablet (20 mg total) by mouth 4 (four) times daily as needed for muscle spasms.   busPIRone 15 MG tablet Commonly known as: BUSPAR Take 1 tablet (15 mg total) by mouth 2 (two) times daily. Must keep follow up 09/21/20 for ongoing refills   citalopram 20 MG tablet Commonly known as: CeleXA Take 1 tablet (20 mg total) by mouth daily.   dalfampridine 10 MG Tb12 Take 1 tablet (10 mg total) by mouth 2 (two) times daily.   Darbepoetin Alfa 40 MCG/0.4ML Sosy injection Commonly known as: ARANESP Inject 0.4 mLs (40 mcg total) into the skin once a week.   gabapentin 800 MG tablet Commonly known as: NEURONTIN Take 1 tablet (800 mg total) by mouth 4 (four) times daily.   metoprolol succinate 50 MG 24 hr tablet Commonly known as: TOPROL-XL Take 50 mg by mouth daily. What changed: Another medication with the same name was removed. Continue taking this medication, and follow the directions you see here.   multivitamin with minerals Tabs tablet Take 1 tablet by mouth daily.   norethindrone 0.35 MG tablet Commonly known as: MICRONOR Take 1 tablet (0.35 mg total) by mouth daily.   omeprazole 40 MG capsule Commonly known as: PRILOSEC Take 40 mg by mouth daily.   oxyCODONE-acetaminophen 10-325 MG tablet Commonly known as: PERCOCET Take 1 tablet by mouth every 4 (  four) hours.   predniSONE 10 MG tablet Commonly known as: DELTASONE Take 6 tablets (60 mg total) by mouth daily with breakfast for 2 days, THEN 4 tablets (40 mg total) daily with breakfast for 2 days, THEN 2 tablets (20 mg total) daily with breakfast for 2 days, THEN 1 tablet (10 mg total) daily with breakfast for 2 days, THEN 0.5 tablets (5 mg total) daily with breakfast for 2 days. Start taking on: November 26, 2020 What changed: See the new instructions.   sucralfate 1 g tablet Commonly known as: Carafate Take 1 tablet (1 g total) by mouth 4 (four) times  daily -  with meals and at bedtime.   SUMAtriptan 100 MG tablet Commonly known as: IMITREX TAKE 1 TABLET BY MOUTH 1 TIME FOR UP TO 1 DOSE AS NEEDED FOR MIGRAINE. MAY REPEAT IN 2 HOURS IF HEADACHE PERSISTS OR RECURS What changed:  how much to take how to take this when to take this reasons to take this additional instructions   tamsulosin 0.4 MG Caps capsule Commonly known as: FLOMAX TAKE 1 CAPSULE(0.4 MG) BY MOUTH DAILY What changed: See the new instructions.   tiZANidine 4 MG tablet Commonly known as: ZANAFLEX TAKE 1 TABLET BY MOUTH EVERY NIGHT AT BEDTIME   Zeposia 0.92 MG Caps Generic drug: Ozanimod HCl TAKE ONE CAPSULE (0.$RemoveBefore'92MG'YIbDiSJUzjEMZ$ )  BY MOUTH ONE TIME DAILY What changed:  how much to take how to take this when to take this additional instructions               Durable Medical Equipment  (From admission, onward)           Start     Ordered   11/26/20 1207  For home use only DME Other see comment  Once       Comments: Patient needs a rollator  Question:  Length of Need  Answer:  12 Months   11/26/20 1210   11/25/20 1151  For home use only DME Walker rolling  Once       Question Answer Comment  Walker: With [place] Wheels   Patient needs a walker to treat with the following condition Weakness      11/25/20 1150   11/25/20 1151  For home use only DME 3 n 1  Once        11/25/20 1150            Disposition and follow-up:   Ms.Solyana Casi Westerfeld was discharged from [facility] in Stable condition.  At the hospital follow up visit please address:  1. Symptomatic normocytic anemia, likely hypoproliferative: Work-up negative so far, s/p 3 units RBCs, started on EPO shot.  Follow-up with hematology. Will need prior auth paperwork signed for darbo alfa shots.  2. Left lower extremity weakness: Treated for MS flare with IV steroids and will discharge on prednisone taper.  2.  Labs / imaging needed at time of follow-up: CBC   3.  Pending  labs/ test needing follow-up: Follow-up further hematologic work-up ordered on 9/22  Follow-up Appointments:  Follow-up [place] Follow up.   Specialty: Rehabilitation Why: Referral has been placed electronically. Please call to schedule an appointment Contact information: [place]. [phone] [place]        Wyatt Portela, MD. Schedule an appointment as soon as possible for a visit in 1 week(s).   Specialty: Oncology Contact information: [phone]  [place] [address] [phone]                 Hospital Course by problem list:  Madasyn Vaishnavi Dalby is a 40 y.o. with pertinent PMH of multiple sclerosis, DVT/PE on Eliquis, hypertension, chronic renal disease who presented with left leg weakness and dizziness and admitted for symptomatic anemia.    #Symptomatic normocytic anemia, likely hypoproliferative Patient reported 1wk of malaise, headaches, lightheadedness, dizziness.  Hemoglobin of 5.0 on admission without evidence of acute bleed or active hemolysis.  Patient does have history of chronic renal insufficiency.  Concern for hypoproliferative process given low reticulocyte count.  CBC remaining stable~7-8.  Work-up has been negative.  Zeposia, MS medication, likely to cause anemia, can cause lymphopenia.  Hematology was consulted and recommended bone marrow biopsy.  Bone marrow biopsy obtained 11/23/2020 showed no clear-cut pathology.  Folic acid mildly low, received supplementation. Differential diagnosis includes viral infection, autoimmune etiology, myelofibrosis.  Patient status post 2 units RBCs.  Patient received additional 1u RBCs on 9/22. Repeat Hgb on 9/23 was 8.5. Patient is stable for discharge on 9/23. Patient will also start on darbepoetin alfa weekly injections, will need hematology or PCP to sign prior auth paper work.  Patient will need  close follow-up with hematology for repeat CBC to monitor hemoglobin as well as further outpatient work-up.   #Left lower extremity weakness #Multiple sclerosis  Patient has a history of MS for which she follows with Dr Felecia Shelling; last OV 09/21/20.  Reported left lower extremity weakness with prior flares.  She presented to ED with left lower extremity weakness with concern for MS flare.  MRI spine, brain without new signal abnormalities to suggest acute flare.  Patient unlikely has MS flare, more likely this is recrudescence of prior MS symptoms in the setting of acute illness. She could also have disease not perceived by MRI.  Patient strongly insisted she is having an acute flare.  Therefore we did treat IV steroids.  She reports that after 3 days IV methylprednisolone, her symptoms have improved.  She was continued on Zeposia daily, Dalfampridine twice daily, Amantadine twice daily, Gabapentin 800 mg QID, tizanidine 4 mg nightly, amitriptyline 25-50 mg nightly, baclofen 20 mg 4 times daily as needed.  At discharge she is requesting p.o. prednisone taper which has been ordered.  Patient should follow-up with her neurologist on her usual schedule.   Hypertension Patient has a history of hypertension for which she is on amlodipine and metoprolol. She reports compliance with her medications. She is normotensive at this time.  Patient was continued on amlodipine 5 mg daily and metoprolol 50 mg daily.   #CKD Stage IIIa Patient has a known history of chronic renal disease. Serum creatinine at baseline at this time.    #Hx of DVT/PE  Patient is on Eliquis daily for a history of bilateral DVT and one PE in 2014. She reports compliance with medications and has not missed any doses. Without evidence of ongoing bleed being cause of patient's anemia, continued the patient's home dose of Eliquis.  No further medical management necessary.   #Chronic back pain For patient's chronic back pain, we ordered oxycodone  linked order which approximates home dose   #Depression, Anxiety Patient was continued on Celexa 20 mg daily, BuSpar 15 mg twice daily.  No further medical management.   Other home medications continued: Methadone, Adderall XR, Protonix 40 mg daily, sucralfate  Subjective on day of discharge: No acute events or concerns overnight.  Patient reports feeling well this morning.  No dizziness.  Has been ambulating without difficulties.  We discussed plan for discharge and patient amendable for dc today. Discussed that she will start prednisone taper today and will need to have PCP or hematologist sign prior auth paperwork for EPO shot.   Discharge Exam:   BP 113/65 (BP Location: Right Arm)   Pulse 82   Temp 98.4 F (36.9 C) (Oral)   Resp 18   Ht $R'5\' 6"'Qr$  (1.676 m)   Wt 97.8 kg   SpO2 99%   BMI 34.80 kg/m  Physical Exam: General: Well appearing African-American female, NAD HENT: normocephalic, atraumatic EYES: conjunctiva non-erythematous, no scleral icterus CV: regular rate, normal rhythm, no murmurs, rubs, gallops. Pulmonary: sating well on RA, lung clear to auscultation, no rales, wheezes, rhonchi Abdominal: non-distended, soft, non-tender to palpation, normal BS Skin: Warm and dry, no rashes or lesions Neurological: MS: awake, alert and oriented x3, normal speech and fund of knowledge Motor: moves all extremities antigravity Psych: normal affect   Pertinent Labs, Studies, and Procedures:  CBC Latest Ref Rng & Units 11/27/2020 11/26/2020 11/25/2020  WBC 4.0 - 10.5 K/uL 7.2 7.9 7.9  Hemoglobin 12.0 - 15.0 g/dL 8.6(L) 7.0(L) 7.3(L)  Hematocrit 36.0 - 46.0 % 25.2(L) 20.7(L) 20.8(L)  Platelets 150 - 400 K/uL 244 277 255   BMP Latest Ref Rng & Units 11/26/2020 11/25/2020 11/24/2020  Glucose 70 - 99 mg/dL 151(H) 156(H) 204(H)  BUN 6 - 20 mg/dL 32(H) 27(H) 17  Creatinine 0.44 - 1.00 mg/dL 1.58(H) 1.74(H) 1.51(H)  BUN/Creat Ratio 9 - 23 - - -  Sodium 135 - 145 mmol/L 138 138 135  Potassium  3.5 - 5.1 mmol/L 3.6 4.0 4.2  Chloride 98 - 111 mmol/L 105 105 101  CO2 22 - 32 mmol/L 25 21(L) 22  Calcium 8.9 - 10.3 mg/dL 9.0 9.2 9.1   Parvovirus B19 IgG antibodies positive, IgM antibodies negative, no active viral infection. RF negative ANCA titers negative Kappa lambda light chain ratio normal SPEP normal HIV negative ANA negative Absolute reticulocyte count 6.8 low  Component Ref Range & Units 5 d ago   Iron 28 - 170 ug/dL 135   TIBC 250 - 450 ug/dL 183 Low    Saturation Ratios 10.4 - 31.8 % 74 High    UIBC ug/dL 48    Component Ref Range & Units 5 d ago   Ferritin 11 - 307 ng/mL 556 High     Component Ref Range & Units 5 d ago 2 yr ago  Folate >5.9 ng/mL 3.7 Low   10.3    Vitamin B-12 180 - 914 pg/mL 213  228 CM    Path Review Marked normocytic anemia.   Comment: No definate blast identified.    Antibody ID,T [place] WARM AUTOANTIBODY    MR Brain W and Wo Contrast  Result Date: 11/21/2020 CLINICAL DATA:  Multiple sclerosis, new event EXAM: MRI HEAD WITHOUT AND WITH CONTRAST TECHNIQUE: Multiplanar, multiecho pulse sequences of the brain and surrounding structures were obtained without and with intravenous contrast. CONTRAST:  28mL GADAVIST GADOBUTROL 1 MMOL/ML IV SOLN COMPARISON:  09/01/2019 FINDINGS: Brain: Chronic white matter disease with periventricular predilection and mild hazy involvement around the fourth ventricle. No progression, restricted diffusion, enhancement, infarct, or mass. Vascular: Normal flow voids and vessel enhancements Skull and upper cervical spine: Normal marrow signal Sinuses/Orbits: Negative IMPRESSION: Multiple sclerosis with stable extent from June 2021. No evidence of active demyelination. Electronically Signed   By: Roderic Palau  Watts M.D.   On: 11/21/2020 04:50   MR CERVICAL SPINE W WO CONTRAST  Result Date: 11/21/2020 CLINICAL DATA:  Multiple sclerosis, new event EXAM: MRI CERVICAL AND THORACIC SPINE WITHOUT AND WITH CONTRAST TECHNIQUE:  Multiplanar and multiecho pulse sequences of the cervical spine, to include the craniocervical junction and cervicothoracic junction, and the thoracic spine, were obtained without and with intravenous contrast. CONTRAST:  97mL GADAVIST GADOBUTROL 1 MMOL/ML IV SOLN COMPARISON:  Cervical MRI 09/01/2019 03/13/2020 FINDINGS: MRI CERVICAL SPINE FINDINGS Alignment: Normal Vertebrae: More hypointense marrow diffusely than on prior, usually from anemia. Cord: Stable appearance with no discrete plaque-like signal abnormality. No cord swelling or enhancement. Posterior Fossa, vertebral arteries, paraspinal tissues: Posterior fossa reported separately. No perispinal mass or inflammation. Disc levels: Diffusely preserved disc height and hydration. Negative facets. No neural impingement. MRI THORACIC SPINE FINDINGS Alignment:  Exaggerated thoracic kyphosis without listhesis. Vertebrae: Diffusely hypointense marrow signal on T1 weighted imaging. No focal lesion Cord: Allowing for intermittent motion artifact, no cord plaque or swelling is seen. Paraspinal and other soft tissues: Known, chronic severe left hydro nephrosis and upper hydroureter with cortical thinning that is marked. T2 hyperintensity in the right liver with hepatic cysts by prior CT. Disc levels: No herniation or impingement IMPRESSION: 1. Stable cervical and thoracic cord. No evidence of active demyelination. 2. Diffusely low marrow signal since 2021, please correlate with CBC. 3. Intermittent motion artifact. Electronically Signed   By: Jorje Guild M.D.   On: 11/21/2020 04:45   MR THORACIC SPINE W WO CONTRAST  Result Date: 11/21/2020 CLINICAL DATA:  Multiple sclerosis, new event EXAM: MRI CERVICAL AND THORACIC SPINE WITHOUT AND WITH CONTRAST TECHNIQUE: Multiplanar and multiecho pulse sequences of the cervical spine, to include the craniocervical junction and cervicothoracic junction, and the thoracic spine, were obtained without and with intravenous  contrast. CONTRAST:  73mL GADAVIST GADOBUTROL 1 MMOL/ML IV SOLN COMPARISON:  Cervical MRI 09/01/2019 03/13/2020 FINDINGS: MRI CERVICAL SPINE FINDINGS Alignment: Normal Vertebrae: More hypointense marrow diffusely than on prior, usually from anemia. Cord: Stable appearance with no discrete plaque-like signal abnormality. No cord swelling or enhancement. Posterior Fossa, vertebral arteries, paraspinal tissues: Posterior fossa reported separately. No perispinal mass or inflammation. Disc levels: Diffusely preserved disc height and hydration. Negative facets. No neural impingement. MRI THORACIC SPINE FINDINGS Alignment:  Exaggerated thoracic kyphosis without listhesis. Vertebrae: Diffusely hypointense marrow signal on T1 weighted imaging. No focal lesion Cord: Allowing for intermittent motion artifact, no cord plaque or swelling is seen. Paraspinal and other soft tissues: Known, chronic severe left hydro nephrosis and upper hydroureter with cortical thinning that is marked. T2 hyperintensity in the right liver with hepatic cysts by prior CT. Disc levels: No herniation or impingement IMPRESSION: 1. Stable cervical and thoracic cord. No evidence of active demyelination. 2. Diffusely low marrow signal since 2021, please correlate with CBC. 3. Intermittent motion artifact. Electronically Signed   By: Jorje Guild M.D.   On: 11/21/2020 04:45   CT Renal Stone Study  Result Date: 11/21/2020 CLINICAL DATA:  Flank pain with kidney stone suspected EXAM: CT ABDOMEN AND PELVIS WITHOUT CONTRAST TECHNIQUE: Multidetector CT imaging of the abdomen and pelvis was performed following the standard protocol without IV contrast. COMPARISON:  07/19/2019 FINDINGS: Lower chest:  No contributory findings. Hepatobiliary: Small hepatic cystic densities.No evidence of biliary obstruction or stone. Pancreas: Unremarkable. Spleen: Unremarkable. Adrenals/Urinary Tract: Negative adrenals. Marked left hydronephrosis and upper hydroureter with  cortical thinning. High-density in the upper pole right kidney and bladder attributed to excreting  gadolinium. Unremarkable bladder. Stomach/Bowel: No obstruction. No bowel wall thickening. Diffuse colonic stool. Vascular/Lymphatic: No acute vascular abnormality. No mass or adenopathy. Reproductive:No pathologic findings. 4.1 cm cystic density in the right ovary, likely follicular based on patient age. Other: No ascites or pneumoperitoneum. Musculoskeletal: No acute abnormalities. Bilateral hip arthroplasty with iliopsoas atrophy. IMPRESSION: 1. No acute finding. 2. 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 3. Generalized colonic stool, please correlate for constipation. Electronically Signed   By: Jorje Guild M.D.   On: 11/21/2020 08:24     Discharge Instructions: Discharge Instructions     Ambulatory referral to Occupational Therapy   Complete by: As directed    Ambulatory referral to Physical Therapy   Complete by: As directed    Overal bed mobility: Modified Independent General bed mobility comments: HOB elevated for supine <> sit; pt assists LLE up onto bed with bil UEs (as she has done PTA)   Transfers Overall transfer level: Needs assistance Equipment used: Rolling walker (2 wheeled) Transfers: Sit to/from Stand Sit to Stand: Modified independent (Device/Increase time)   Ambulation/Gait Ambulation/Gait assistance: Supervision;Modified independent (Device/Increase time) Gait Distance (Feet): 200 Feet Assistive device: Rolling walker (2 wheeled) Gait Pattern/deviations: Step-through pattern;Decreased dorsiflexion - left;Steppage Gait velocity: decreased General Gait Details: pt normally uses brace for left foot drop (not here) and compensating wiht steppage gait; much improved endurance and safety with ambulation   Call MD for:  difficulty breathing, headache or visual disturbances   Complete by: As directed    Call MD for:  difficulty breathing,  headache or visual disturbances   Complete by: As directed    Call MD for:  extreme fatigue   Complete by: As directed    Call MD for:  extreme fatigue   Complete by: As directed    Call MD for:  persistant dizziness or light-headedness   Complete by: As directed    Call MD for:  persistant dizziness or light-headedness   Complete by: As directed    Call MD for:  persistant nausea and vomiting   Complete by: As directed    Call MD for:  persistant nausea and vomiting   Complete by: As directed    Call MD for:  redness, tenderness, or signs of infection (pain, swelling, redness, odor or green/yellow discharge around incision site)   Complete by: As directed    Call MD for:  redness, tenderness, or signs of infection (pain, swelling, redness, odor or green/yellow discharge around incision site)   Complete by: As directed    Call MD for:  temperature >100.4   Complete by: As directed    Call MD for:  temperature >100.4   Complete by: As directed    No wound care   Complete by: As directed    No wound care   Complete by: As directed        Signed: [name], MD 11/27/2020, 8:33 AM   Pager: [phone]

## 2020-11-26 NOTE — Progress Notes (Signed)
Pt's IV is occluded upon starting unit of PRBC. Unable to flush IV. Charge nurse Gerald Stabs assessed IV site too. Removed IV. IV team consult in place. Notified blood bank they stated as long as blood is infused in four hours from picking up blood from blood bank it is ok to transfuse. Will continue to monitor pt. Racheal Patches RN

## 2020-11-26 NOTE — Progress Notes (Signed)
IV team started IV in left upper arm. Started blood transfusion at 2116 in left upper arm. Will continue to monitor pt. Racheal Patches RN

## 2020-11-26 NOTE — Progress Notes (Signed)
Physical Therapy Treatment Patient Details Name: Dawn Peters MRN: [id number] DOB: 05/14/1980 Today's Date: 11/26/2020   History of Present Illness 40 y.o. female presented to [facility] 11/21/20 with left leg weakness, dizziness, and fall. MRI brain, spine negative for acute MS flare. Hgb 5.0; given 2U PRBC with Hgb 7.7   PMH of multiple sclerosis, hypertension history of DVT/PE on Eliquis, and chronic kidney disease    PT Comments    Pt educated on rollator use and demonstrated gait with rollator.  Pt requesting rollator for home.  Pt reports she is supposed to get a unit of blood and then d/cing home.  Focused on d/c and back pain.  Did educate on gentle core exercises including pelvic tilt and pelvic tilt with leg lifts/knees bent.     Recommendations for follow up therapy are one component of a multi-disciplinary discharge planning process, led by the attending physician.  Recommendations may be updated based on patient status, additional functional criteria and insurance authorization.  Follow Up Recommendations  Outpatient PT     Equipment Recommendations  Other (comment) (rollator)    Recommendations for Other Services       Precautions / Restrictions Precautions Precautions: None Precaution Comments: One fall in past year. Does have dizziness.     Mobility  Bed Mobility               General bed mobility comments: in chair    Transfers Overall transfer level: Needs assistance Equipment used: 4-wheeled walker Transfers: Sit to/from Stand Sit to Stand: Modified independent (Device/Increase time)            Ambulation/Gait Ambulation/Gait assistance: Supervision Gait Distance (Feet): 250 Feet Assistive device: 4-wheeled walker Gait Pattern/deviations: Step-through pattern;Trunk flexed     General Gait Details: Educated on rollator; pt with L foot drop -chronic but compensates well; Pt tending to lean forward on 1 elbow on rollator at times vs stand  straight with 1 hand on rollator and the other on hand rail trying to relief back pain - despite deviations on rollator use was steady   Marine scientist [place] (Stroke Patients Only)       Balance Overall balance assessment: Needs assistance Sitting-balance support: No upper extremity supported;Feet unsupported Sitting balance-Leahy Scale: Normal     Standing balance support: No upper extremity supported;Bilateral upper extremity supported Standing balance-Leahy Scale: Good Standing balance comment: able to stand without UE support                            Cognition Arousal/Alertness: Awake/alert Behavior During Therapy: WFL for tasks assessed/performed Overall Cognitive Status: Within Functional Limits for tasks assessed                                 General Comments: somewhat distracted/focused on back pain, getting PRBC, and d/c      Exercises General Exercises - Upper Extremity Shoulder Flexion: Strengthening;Both;10 reps;Theraband Theraband Level (Shoulder Flexion): Level 2 (Red) Shoulder ABduction: Strengthening;Both;10 reps;Supine;Theraband Theraband Level (Shoulder Abduction): Level 2 (Red) Elbow Flexion: Strengthening;Both;10 reps;Supine;Theraband Theraband Level (Elbow Flexion): Level 2 (Red) Elbow Extension: Strengthening;Both;10 reps;Supine;Theraband Theraband Level (Elbow Extension): Level 2 (Red)    General Comments        Pertinent Vitals/Pain Pain Assessment: Faces Faces Pain Scale: Hurts even more Pain Location: "  Entire Back" chronic Pain Descriptors / Indicators: Aching Pain Intervention(s): Limited activity within patient's tolerance;Monitored during session    Home Living                      Prior Function            PT Goals (current goals can now be found in the care plan section) Acute Rehab PT Goals Patient Stated Goal: Be able to get in and out of high  tub at home. home health to help with safe exercises. Progress towards PT goals: Progressing toward goals    Frequency    Min 3X/week      PT Plan Current plan remains appropriate    Co-evaluation              AM-PAC PT "6 Clicks" Mobility   Outcome Measure  Help needed turning from your back to your side while in a flat bed without using bedrails?: None Help needed moving from lying on your back to sitting on the side of a flat bed without using bedrails?: None Help needed moving to and from a bed to a chair (including a wheelchair)?: None Help needed standing up from a chair using your arms (e.g., wheelchair or bedside chair)?: None Help needed to walk in hospital room?: None Help needed climbing 3-5 steps with a railing? : A Little 6 Click Score: 23    End of Session Equipment Utilized During Treatment: Gait belt Activity Tolerance: Patient tolerated treatment well Patient left: with call bell/phone within reach;in chair Nurse Communication: Mobility status PT Visit Diagnosis: Muscle weakness (generalized) (M62.81);History of falling (Z91.81);Other abnormalities of gait and mobility (R26.89)     Time: 1500-1520 PT Time Calculation (min) (ACUTE ONLY): 20 min  Charges:  $Gait Training: 8-22 mins                     Abran Richard, PT Acute Rehab Services Pager [phone] [facility] 11/26/2020, 3:59 PM

## 2020-11-26 NOTE — Discharge Instructions (Addendum)
You were hospitalized for symptomatic anemia. Thank you for allowing [place] to be part of your care.   You will need to follow-up with hematology.  Please call their office, number provided, to make sure an appointment is set up within 1 week.  Your hematologist or your primary care physician will need to fill out paperwork for the injections's prior authorization.  Please note these changes made to your medications:   Please START taking:  Darbepoetin alfa weekly injection (for anemia) Prednisone taper (for possible MS flare)  Information on my medicine - ELIQUIS (apixaban)  This medication education was reviewed with me or my healthcare representative as part of my discharge preparation.  You were taking this medication prior to this hospital admission.  Eliquis for previous history of blood clots (DVT and PE= pulmonary embolus).    Why was Eliquis prescribed for you? Eliquis was prescribed for you to reduce the risk of forming blood clots that can cause a stroke if you have a medical condition called atrial fibrillation (a type of irregular heartbeat) OR to reduce the risk of a blood clots forming after orthopedic surgery.   Eliquis for previous history of blood clots (DVT and PE= pulmonary embolus).    What do You need to know about Eliquis ? Take your Eliquis TWICE DAILY - one tablet in the morning and one tablet in the evening with or without food.  It would be best to take the doses about the same time each day.  If you have difficulty swallowing the tablet whole please discuss with your pharmacist how to take the medication safely.  Take Eliquis exactly as prescribed by your doctor and DO NOT stop taking Eliquis without talking to the doctor who prescribed the medication.  Stopping may increase your risk of developing a new clot or stroke.  Refill your prescription before you run out.  After discharge, you should have regular check-up appointments with your healthcare provider  that is prescribing your Eliquis.  In the future your dose may need to be changed if your kidney function or weight changes by a significant amount or as you get older.  What do you do if you miss a dose? If you miss a dose, take it as soon as you remember on the same day and resume taking twice daily.  Do not take more than one dose of ELIQUIS at the same time.  Important Safety Information A possible side effect of Eliquis is bleeding. You should call your healthcare provider right away if you experience any of the following: Bleeding from an injury or your nose that does not stop. Unusual colored urine (red or dark brown) or unusual colored stools (red or black). Unusual bruising for unknown reasons. A serious fall or if you hit your head (even if there is no bleeding).  Some medicines may interact with Eliquis and might increase your risk of bleeding or clotting while on Eliquis. To help avoid this, consult your healthcare provider or pharmacist prior to using any new prescription or non-prescription medications, including herbals, vitamins, non-steroidal anti-inflammatory drugs (NSAIDs) and supplements.  This website has more information on Eliquis (apixaban): [email or web address].no.

## 2020-11-26 NOTE — Progress Notes (Signed)
PT Cancellation Note  Patient Details Name: Anniah Glick MRN: [id number] DOB: [birth year]   Cancelled Treatment:    Reason Eval/Treat Not Completed: Other (comment) Pt declined PT in am due to wanting to take a shower first.  She reports she is then getting PRBC and going home.  Will f/u as able.  Abran Richard, PT Acute Rehab Services Pager [phone] [facility] [place] 11/26/2020, 1:50 PM

## 2020-11-26 NOTE — Progress Notes (Signed)
Occupational Therapy Treatment Patient Details Name: Dawn Peters MRN: [id number] DOB: March 26, 1980 Today's Date: 11/26/2020   History of present illness 40 y.o. female presented to [facility] 11/21/20 with left leg weakness, dizziness, and fall. MRI brain, spine negative for acute MS flare. Hgb 5.0; given 2U PRBC with Hgb 7.7   PMH of multiple sclerosis, hypertension history of DVT/PE on Eliquis, and chronic kidney disease   OT comments  Patient received in bed and asked to remain in bed due to expected to go for blood transfusion. Patient agreed to go over HEP and energy conservation techniques.  Patient was provided with HEP and handout for energy conservation. Patient performed each exercise with vcs and handout for energy conservation was discussed. Acute OT to continue to follow, though patient believes she will leave today.    Recommendations for follow up therapy are one component of a multi-disciplinary discharge planning process, led by the attending physician.  Recommendations may be updated based on patient status, additional functional criteria and insurance authorization.    Follow Up Recommendations  Home health OT    Equipment Recommendations  Tub/shower bench;3 in 1 bedside commode;Other (comment)    Recommendations for Other Services      Precautions / Restrictions Precautions Precautions: Fall Precaution Comments: One fall in past year. Does have dizziness. Restrictions Weight Bearing Restrictions: No       Mobility Bed Mobility                    Transfers                      Balance                                           ADL either performed or assessed with clinical judgement   ADL                                               Vision       Perception     Praxis      Cognition Arousal/Alertness: Awake/alert Behavior During Therapy: WFL for tasks assessed/performed Overall Cognitive  Status: Within Functional Limits for tasks assessed                                 General Comments: demonstrated understanding of energy conservation techniques        Exercises Exercises: General Upper Extremity General Exercises - Upper Extremity Shoulder Flexion: Strengthening;Both;10 reps;Theraband Theraband Level (Shoulder Flexion): Level 2 (Red) Shoulder ABduction: Strengthening;Both;10 reps;Supine;Theraband Theraband Level (Shoulder Abduction): Level 2 (Red) Elbow Flexion: Strengthening;Both;10 reps;Supine;Theraband Theraband Level (Elbow Flexion): Level 2 (Red) Elbow Extension: Strengthening;Both;10 reps;Supine;Theraband Theraband Level (Elbow Extension): Level 2 (Red)   Shoulder Instructions       General Comments      Pertinent Vitals/ Pain       Pain Assessment: 0-10  Home Living                                          Prior Functioning/Environment  Frequency  Min 2X/week        Progress Toward Goals  OT Goals(current goals can now be found in the care plan section)  Progress towards OT goals: Progressing toward goals  Acute Rehab OT Goals Patient Stated Goal: Be able to get in and out of high tub at home. home health to help with safe exercises. OT Goal Formulation: With patient Time For Goal Achievement: 12/07/20 Potential to Achieve Goals: Good ADL Goals Pt Will Perform Lower Body Dressing: with adaptive equipment;sitting/lateral leans;sit to/from stand;with modified independence Pt Will Transfer to Toilet: with modified independence;ambulating Pt Will Perform Toileting - Clothing Manipulation and hygiene: with modified independence;with adaptive equipment;sitting/lateral leans;sit to/from stand Pt Will Perform Tub/Shower Transfer: with supervision;Tub transfer;ambulating;tub bench Pt/caregiver will Perform Home Exercise Program: Both right and left upper extremity;With  theraband;Independently Additional ADL Goal #1: Patient will identify at least 3 energy conservation strategies to employ at home in order to maximize function and quality of life and decrease caregiver burden while preventing exacerbation of symptoms and rehospitalization.  Plan Discharge plan remains appropriate    Co-evaluation                 AM-PAC OT "6 Clicks" Daily Activity     Outcome Measure   Help from another person eating meals?: None Help from another person taking care of personal grooming?: A Little Help from another person toileting, which includes using toliet, bedpan, or urinal?: A Little Help from another person bathing (including washing, rinsing, drying)?: A Little Help from another person to put on and taking off regular upper body clothing?: A Little Help from another person to put on and taking off regular lower body clothing?: A Little 6 Click Score: 19    End of Session    OT Visit Diagnosis: Unsteadiness on feet (R26.81);Dizziness and giddiness (R42);History of falling (Z91.81)   Activity Tolerance Patient tolerated treatment well   Patient Left in bed;with call bell/phone within reach;with bed alarm set   Nurse Communication Other (comment) (discussed patient's participation)        Time: 4709-2957 OT Time Calculation (min): 25 min  Charges: OT General Charges $OT Visit: 1 Visit OT Treatments $Therapeutic Activity: 8-22 mins $Therapeutic Exercise: 8-22 mins  Lodema Hong, OTA   Dawn Peters Alexis Goodell 11/26/2020, 12:07 PM

## 2020-11-26 NOTE — Progress Notes (Signed)
Notified Dr. Rosey Bath that pt's blood transfusion is just ready now, going to start it and then pt has to get posttransfusion lab draw as a result it is going to be late before pt could be d/c home. Pt requested to be d/c tomorrow. MD stated pt could stay tonight and be d/c home tomorrow. Will continue to monitor pt. Racheal Patches RN

## 2020-11-26 NOTE — TOC Progression Note (Addendum)
Transition of Care [facility]) - Progression Note    Patient Details  Name: Amoria Mclees MRN: [id number] Date of Birth: 1980/03/28  [facility] CM/SW Contact  Loletha Grayer Beverely Pace, RN Phone Number: 11/26/2020, 12:21 PM  Clinical Narrative:  Cae Manager was asked to order a rollator for patient. CM contacted Freda Munro with [employer], they will exchange rolling walker for Rollator.   12:44pm; Freda Munro with [employer] informed CM that patient will not receive a rollator, she actually had one last year and requested a RW, she is not eligible for Rollator or RW at this time. CM will update therapist.   Expected Discharge Plan: Home/Self Care Barriers to Discharge: Continued Medical Work up  Expected Discharge Plan and Services Expected Discharge Plan: Home/Self Care   Discharge Planning Services: CM Consult Post Acute Care Choice: Durable Medical Equipment Living arrangements for the past 2 months: Single Family Home                 DME Arranged: Walker rolling with seat DME Agency: [employer] Date DME Agency Contacted: 11/26/20 Time DME Agency Contacted: 1219 Representative spoke with at DME Agency: [place]: NA         Social Determinants of Health ([place]) Interventions    Readmission Risk Interventions No flowsheet data found.

## 2020-11-26 NOTE — Progress Notes (Signed)
Subjective: No acute events or concerns overnight.  Seen this morning on rounds.  Patient denying dizziness at rest, difficulties ambulating, difficulties with daily activities.  Patient satisfied with improvement in left lower extremity on IV steroids, and requests prednisone taper.  We discussed negative work-up of hypoproliferative anemia and plan for outpatient follow-up.  Patient amenable to discharge today.  She understands she will receive 1 more unit of blood and will start on EPO shots.  Objective:  Vital signs in last 24 hours: Vitals:   11/26/20 0820 11/26/20 0825 11/26/20 1100 11/26/20 1648  BP: 114/70 114/70 113/71 121/78  Pulse: 82 80 93 82  Resp: 16  18 18   Temp: 98 F (36.7 C)  98.1 F (36.7 C) 98.3 F (36.8 C)  TempSrc: Oral  Oral Oral  SpO2: 98%  100% 100%  Weight:      Height:       Physical Exam: General: Well appearing African-American female, NAD HENT: normocephalic, atraumatic EYES: conjunctiva non-erythematous, no scleral icterus CV: regular rate, normal rhythm, no murmurs, rubs, gallops. Pulmonary: sating well on RA, lung clear to auscultation, no rales, wheezes, rhonchi Abdominal: non-distended, soft, non-tender to palpation, normal BS Skin: Warm and dry, no rashes or lesions Neurological: MS: awake, alert and oriented x3, normal speech and fund of knowledge Motor: moves all extremities antigravity Psych: normal affect    Assessment/Plan:  Active Problems:   Symptomatic anemia  Dawn Peters is a 40 y.o. with pertinent PMH of multiple sclerosis, DVT/PE on Eliquis, hypertension, chronic renal disease who presented with left leg weakness and dizziness and admitted for symptomatic anemia on hospital day 5     #Symptomatic normocytic anemia, likely hypoproliferative Patient reported 1wk of malaise, headaches, lightheadedness, dizziness.  Hemoglobin of 5.0 on admission without evidence of acute bleed or active hemolysis. Concern for  hypoproliferative process given low reticulocyte count.  CBC remaining stable~7-8.  Work-up has been negative.  Bone marrow not suggestive of specific pathology. -Hematology consulted, appreciate recs -Neurology reports [name] can cause lymphopenia less likely anemia. -F/U FOBT  -Folic acid supplement -Trend CBC -Patient will receive 1 additional unit RBCs and is stable for discharge after transfusion.  Discharge this evening or possibly tomorrow morning. -Patient will start on EPO shots weekly  #Left lower extremity weakness #Multiple sclerosis  Patient has a history of MS for which she follows with Dr Felecia Shelling; last OV 09/21/20.  Reported left lower extremity weakness with prior flares.  She presented to ED with left lower extremity weakness with concern for MS flare.  MRI spine, brain without new signal abnormalities to suggest acute flare.  Patient unlikely has MS flare, more likely this is recrudescence of prior MS symptoms in the setting of acute illness. She could also have disease not perceived by MRI. She reports that after starting IV methylprednisolone her symptoms have improved.  -Continue Zeposia daily -Completed IV methylpred 500mg   -Dalfampridine twice daily -Amantadine twice daily  -Gabapentin 800 mg QID, tizanidine 4 mg nightly, amitriptyline 25-50 mg nightly, baclofen 20 mg 4 times daily as needed -Patient will discharge on prednisone taper.  Hypertension Patient has a history of hypertension for which she is on amlodipine and metoprolol. She reports compliance with her medications. She is normotensive at this time.  - Continue amlodipine 5mg  daily  - Continue metoprolol 50mg  daily   #CKD Stage IIIa Patient has a known history of chronic renal disease. Serum creatinine appears to be at baseline at this time.  - Continue to  monitor renal function - Avoid nephrotoxic agents    #Hx of DVT/PE  Patient is on Eliquis daily for a history of bilateral DVT and one PE in 2014. She  reports compliance with medications and has not missed any doses. Without evidence of ongoing bleed being cause of patient's anemia, can continue with home dose of Eliquis. - Continue Eliquis 5mg  bid   #Chronic back pain - Continue oxy linked order which approximates home dose  #Depression, Anxiety -Continue Celexa 20 mg daily, BuSpar 15 mg twice daily  Other home medications continued: Methadone, Adderall XR, Protonix 40 mg daily, sucralfate   Diet: Heart healthy VTE: Eliquis IVF: None Code: Harden Mo, MD 11/26/2020, 5:34 PM Pager: [phone] After 5pm on weekdays and 1pm on weekends: On Call pager [phone]

## 2020-11-27 LAB — CBC
HCT: 25.2 % — ABNORMAL LOW (ref 36.0–46.0)
Hemoglobin: 8.6 g/dL — ABNORMAL LOW (ref 12.0–15.0)
MCH: 31.3 pg (ref 26.0–34.0)
MCHC: 34.1 g/dL (ref 30.0–36.0)
MCV: 91.6 fL (ref 80.0–100.0)
Platelets: 244 10*3/uL (ref 150–400)
RBC: 2.75 MIL/uL — ABNORMAL LOW (ref 3.87–5.11)
RDW: 14.6 % (ref 11.5–15.5)
WBC: 7.2 10*3/uL (ref 4.0–10.5)
nRBC: 0 % (ref 0.0–0.2)

## 2020-11-27 LAB — BPAM RBC
Blood Product Expiration Date: 202210212359
ISSUE DATE / TIME: 202209222016
Unit Type and Rh: 7300

## 2020-11-27 LAB — TYPE AND SCREEN
ABO/RH(D): B POS
Antibody Screen: POSITIVE
DAT, IgG: POSITIVE
Donor AG Type: NEGATIVE
Unit division: 0

## 2020-11-27 MED ORDER — DARBEPOETIN ALFA 40 MCG/0.4ML IJ SOSY: 40.0000 ug | PREFILLED_SYRINGE | INTRAMUSCULAR | 0 refills | Status: AC

## 2020-11-27 NOTE — TOC Transition Note (Signed)
Transition of Care [facility]) - CM/SW Discharge Note   Patient Details  Name: Dawn Peters MRN: [id number] Date of Birth: Jul 31, 1980  Transition of Care [facility]) CM/SW Contact:  Verdell Carmine, RN Phone Number: 11/27/2020, 7:44 AM   Clinical Narrative:    Spoke to patent via phone due to remote.  Patient states she has a walker at home, one of the handles is broken, but she will make sure and try to fix it. She does not currently have availability to obtain another walker out of pocket. She will follow up with outpaitent PT at [facility] [place] [place]. ( In patient instructions.  NO furhter patient needs identified.    Barriers to Discharge: Continued Medical Work up   Patient Goals and CMS Choice Patient states their goals for this hospitalization and ongoing recovery are:: to go home   Choice offered to / list presented to : NA  Discharge Placement               Home self care Referral to outpatient PT        Discharge Plan and Services   Discharge Planning Services: CM Consult Post Acute Care Choice: Durable Medical Equipment          DME Arranged: Walker rolling with seat DME Agency: [employer] Date DME Agency Contacted: 11/26/20 Time DME Agency Contacted: 1219 Representative spoke with at DME Agency: [place]: NA        Social Determinants of Health ([place]) Interventions     Readmission Risk Interventions No flowsheet data found.

## 2020-11-27 NOTE — Progress Notes (Signed)
Sana Susana Duell to be D/C'd Home per MD order.  Discussed with the patient and all questions fully answered.  VSS, Skin clean, dry and intact without evidence of skin break down, no evidence of skin tears noted. IV catheter discontinued intact. Site without signs and symptoms of complications. Dressing and pressure applied.  An After Visit Summary was printed and given to the patient. Patient received prescription.  D/c education completed with patient/family including follow up instructions, medication list, d/c activities limitations if indicated, with other d/c instructions as indicated by MD - patient able to verbalize understanding, all questions fully answered.   Patient instructed to return to ED, call 911, or call MD for any changes in condition.   Patient escorted via [place], and D/C home via private auto.  [place] 11/27/2020 8:56 AM

## 2020-12-01 ENCOUNTER — Encounter (HOSPITAL_COMMUNITY): Payer: Self-pay | Admitting: Oncology

## 2020-12-04 ENCOUNTER — Inpatient Hospital Stay: Payer: Medicare Other | Admitting: Oncology

## 2020-12-04 ENCOUNTER — Inpatient Hospital Stay: Payer: Medicare Other | Attending: Physician Assistant

## 2020-12-04 ENCOUNTER — Inpatient Hospital Stay: Payer: Medicare Other

## 2020-12-08 ENCOUNTER — Telehealth: Payer: Self-pay | Admitting: *Deleted

## 2020-12-08 NOTE — Telephone Encounter (Signed)
Pt called stating she was symptomatic and missed appt with Dr.Shadad due to inclement weather. Per Dr.Shadad, wants pt to be scheduled for labs, injection and blood products this week. High priority message was sent to scheduling to have pt scheduled.

## 2020-12-10 ENCOUNTER — Other Ambulatory Visit: Payer: Self-pay | Admitting: *Deleted

## 2020-12-11 ENCOUNTER — Encounter: Payer: Self-pay | Admitting: *Deleted

## 2020-12-11 ENCOUNTER — Other Ambulatory Visit: Payer: Self-pay | Admitting: *Deleted

## 2020-12-11 ENCOUNTER — Inpatient Hospital Stay: Payer: Medicare Other

## 2020-12-11 ENCOUNTER — Other Ambulatory Visit: Payer: Self-pay | Admitting: Oncology

## 2020-12-11 ENCOUNTER — Other Ambulatory Visit: Payer: Self-pay

## 2020-12-11 VITALS — BP 114/73 | HR 93 | Temp 98.6°F | Resp 18

## 2020-12-11 DIAGNOSIS — G8929 Other chronic pain: Secondary | ICD-10-CM | POA: Diagnosis present

## 2020-12-11 DIAGNOSIS — Z7901 Long term (current) use of anticoagulants: Secondary | ICD-10-CM | POA: Insufficient documentation

## 2020-12-11 DIAGNOSIS — M6281 Muscle weakness (generalized): Secondary | ICD-10-CM | POA: Insufficient documentation

## 2020-12-11 DIAGNOSIS — M545 Low back pain, unspecified: Secondary | ICD-10-CM | POA: Insufficient documentation

## 2020-12-11 DIAGNOSIS — R296 Repeated falls: Secondary | ICD-10-CM | POA: Diagnosis present

## 2020-12-11 DIAGNOSIS — G35 Multiple sclerosis: Secondary | ICD-10-CM | POA: Insufficient documentation

## 2020-12-11 DIAGNOSIS — R269 Unspecified abnormalities of gait and mobility: Secondary | ICD-10-CM | POA: Insufficient documentation

## 2020-12-11 DIAGNOSIS — D619 Aplastic anemia, unspecified: Secondary | ICD-10-CM | POA: Insufficient documentation

## 2020-12-11 DIAGNOSIS — R258 Other abnormal involuntary movements: Secondary | ICD-10-CM | POA: Insufficient documentation

## 2020-12-11 DIAGNOSIS — D649 Anemia, unspecified: Secondary | ICD-10-CM

## 2020-12-11 DIAGNOSIS — R278 Other lack of coordination: Secondary | ICD-10-CM | POA: Diagnosis present

## 2020-12-11 DIAGNOSIS — N189 Chronic kidney disease, unspecified: Secondary | ICD-10-CM | POA: Insufficient documentation

## 2020-12-11 DIAGNOSIS — R29818 Other symptoms and signs involving the nervous system: Secondary | ICD-10-CM | POA: Diagnosis present

## 2020-12-11 DIAGNOSIS — R2689 Other abnormalities of gait and mobility: Secondary | ICD-10-CM | POA: Diagnosis present

## 2020-12-11 DIAGNOSIS — R2681 Unsteadiness on feet: Secondary | ICD-10-CM | POA: Diagnosis present

## 2020-12-11 DIAGNOSIS — Z86718 Personal history of other venous thrombosis and embolism: Secondary | ICD-10-CM | POA: Insufficient documentation

## 2020-12-11 DIAGNOSIS — N183 Chronic kidney disease, stage 3 unspecified: Secondary | ICD-10-CM

## 2020-12-11 LAB — CBC WITH DIFFERENTIAL (CANCER CENTER ONLY)
Abs Immature Granulocytes: 0.04 10*3/uL (ref 0.00–0.07)
Basophils Absolute: 0 10*3/uL (ref 0.0–0.1)
Basophils Relative: 0 %
Eosinophils Absolute: 0 10*3/uL (ref 0.0–0.5)
Eosinophils Relative: 0 %
HCT: 20.5 % — ABNORMAL LOW (ref 36.0–46.0)
Hemoglobin: 6.8 g/dL — CL (ref 12.0–15.0)
Immature Granulocytes: 1 %
Lymphocytes Relative: 3 %
Lymphs Abs: 0.1 10*3/uL — ABNORMAL LOW (ref 0.7–4.0)
MCH: 30.9 pg (ref 26.0–34.0)
MCHC: 33.2 g/dL (ref 30.0–36.0)
MCV: 93.2 fL (ref 80.0–100.0)
Monocytes Absolute: 0.3 10*3/uL (ref 0.1–1.0)
Monocytes Relative: 7 %
Neutro Abs: 4.6 10*3/uL (ref 1.7–7.7)
Neutrophils Relative %: 89 %
Platelet Count: 306 10*3/uL (ref 150–400)
RBC: 2.2 MIL/uL — ABNORMAL LOW (ref 3.87–5.11)
RDW: 14.1 % (ref 11.5–15.5)
WBC Count: 5.1 10*3/uL (ref 4.0–10.5)
nRBC: 0 % (ref 0.0–0.2)

## 2020-12-11 LAB — PREPARE RBC (CROSSMATCH)

## 2020-12-11 LAB — SAMPLE TO BLOOD BANK

## 2020-12-11 MED ORDER — EPOETIN ALFA-EPBX 10000 UNIT/ML IJ SOLN
INTRAMUSCULAR | Status: AC
  Administered 2020-12-11: 10000 [IU] via SUBCUTANEOUS
  Filled 2020-12-11: qty 1

## 2020-12-11 MED ORDER — EPOETIN ALFA-EPBX 10000 UNIT/ML IJ SOLN: 10000.0000 [IU] | Freq: Once | INTRAMUSCULAR | Status: AC

## 2020-12-11 NOTE — Patient Instructions (Signed)
Epoetin Alfa injection What is this medication? EPOETIN ALFA (e POE e tin AL fa) helps your body make more red blood cells. This medicine is used to treat anemia caused by chronic kidney disease, cancer chemotherapy, or HIV-therapy. It may also be used before surgery if you have anemia. This medicine may be used for other purposes; ask your health care provider or pharmacist if you have questions. COMMON BRAND NAME(S): Epogen, Procrit, Retacrit What should I tell my care team before I take this medication? They need to know if you have any of these conditions: cancer heart disease high blood pressure history of blood clots history of stroke low levels of folate, iron, or vitamin B12 in the blood seizures an unusual or allergic reaction to erythropoietin, albumin, benzyl alcohol, hamster proteins, other medicines, foods, dyes, or preservatives pregnant or trying to get pregnant breast-feeding How should I use this medication? This medicine is for injection into a vein or under the skin. It is usually given by a health care professional in a hospital or clinic setting. If you get this medicine at home, you will be taught how to prepare and give this medicine. Use exactly as directed. Take your medicine at regular intervals. Do not take your medicine more often than directed. It is important that you put your used needles and syringes in a special sharps container. Do not put them in a trash can. If you do not have a sharps container, call your pharmacist or healthcare provider to get one. A special MedGuide will be given to you by the pharmacist with each prescription and refill. Be sure to read this information carefully each time. Talk to your pediatrician regarding the use of this medicine in children. While this drug may be prescribed for selected conditions, precautions do apply. Overdosage: If you think you have taken too much of this medicine contact a poison control center or emergency  room at once. NOTE: This medicine is only for you. Do not share this medicine with others. What if I miss a dose? If you miss a dose, take it as soon as you can. If it is almost time for your next dose, take only that dose. Do not take double or extra doses. What may interact with this medication? Interactions have not been studied. This list may not describe all possible interactions. Give your health care provider a list of all the medicines, herbs, non-prescription drugs, or dietary supplements you use. Also tell them if you smoke, drink alcohol, or use illegal drugs. Some items may interact with your medicine. What should I watch for while using this medication? Your condition will be monitored carefully while you are receiving this medicine. You may need blood work done while you are taking this medicine. This medicine may cause a decrease in vitamin B6. You should make sure that you get enough vitamin B6 while you are taking this medicine. Discuss the foods you eat and the vitamins you take with your health care professional. What side effects may I notice from receiving this medication? Side effects that you should report to your doctor or health care professional as soon as possible: allergic reactions like skin rash, itching or hives, swelling of the face, lips, or tongue seizures signs and symptoms of a blood clot such as breathing problems; changes in vision; chest pain; severe, sudden headache; pain, swelling, warmth in the leg; trouble speaking; sudden numbness or weakness of the face, arm or leg signs and symptoms of a stroke like   changes in vision; confusion; trouble speaking or understanding; severe headaches; sudden numbness or weakness of the face, arm or leg; trouble walking; dizziness; loss of balance or coordination Side effects that usually do not require medical attention (report to your doctor or health care professional if they continue or are  bothersome): chills cough dizziness fever headaches joint pain muscle cramps muscle pain nausea, vomiting pain, redness, or irritation at site where injected This list may not describe all possible side effects. Call your doctor for medical advice about side effects. You may report side effects to FDA at 1-800-FDA-1088. Where should I keep my medication? Keep out of the reach of children. Store in a refrigerator between 2 and 8 degrees C (36 and 46 degrees F). Do not freeze or shake. Throw away any unused portion if using a single-dose vial. Multi-dose vials can be kept in the refrigerator for up to 21 days after the initial dose. Throw away unused medicine. NOTE: This sheet is a summary. It may not cover all possible information. If you have questions about this medicine, talk to your doctor, pharmacist, or health care provider.  2022 Elsevier/Gold Standard (2016-09-30 08:35:19)  

## 2020-12-11 NOTE — Progress Notes (Signed)
Critical Value Hgb 6.8.  Value given to Dr Alen Blew.  Pt scheduled for 2 units PRBC tomorrow.  Pt aware.

## 2020-12-12 ENCOUNTER — Inpatient Hospital Stay: Payer: Medicare Other

## 2020-12-12 DIAGNOSIS — D649 Anemia, unspecified: Secondary | ICD-10-CM

## 2020-12-12 DIAGNOSIS — R2689 Other abnormalities of gait and mobility: Secondary | ICD-10-CM | POA: Diagnosis not present

## 2020-12-12 MED ORDER — ACETAMINOPHEN 325 MG PO TABS
ORAL_TABLET | ORAL | Status: AC
  Filled 2020-12-12: qty 2

## 2020-12-12 MED ORDER — DIPHENHYDRAMINE HCL 25 MG PO CAPS
25.0000 mg | ORAL_CAPSULE | Freq: Once | ORAL | Status: AC
  Administered 2020-12-12: 25 mg via ORAL

## 2020-12-12 MED ORDER — ACETAMINOPHEN 325 MG PO TABS
650.0000 mg | ORAL_TABLET | Freq: Once | ORAL | Status: AC
  Administered 2020-12-12: 650 mg via ORAL

## 2020-12-12 MED ORDER — SODIUM CHLORIDE 0.9% IV SOLUTION
250.0000 mL | Freq: Once | INTRAVENOUS | Status: AC
  Administered 2020-12-12: 250 mL via INTRAVENOUS

## 2020-12-12 MED ORDER — DIPHENHYDRAMINE HCL 25 MG PO CAPS
ORAL_CAPSULE | ORAL | Status: AC
  Filled 2020-12-12: qty 1

## 2020-12-13 LAB — ERYTHROPOIETIN: Erythropoietin: 1002 m[IU]/mL — ABNORMAL HIGH (ref 2.6–18.5)

## 2020-12-14 ENCOUNTER — Telehealth: Payer: Self-pay | Admitting: Oncology

## 2020-12-14 NOTE — Telephone Encounter (Signed)
Scheduled per 10/7 sch msg, msg was left with pt

## 2020-12-15 ENCOUNTER — Other Ambulatory Visit: Payer: Self-pay | Admitting: Oncology

## 2020-12-15 DIAGNOSIS — D649 Anemia, unspecified: Secondary | ICD-10-CM

## 2020-12-15 LAB — TYPE AND SCREEN
ABO/RH(D): B POS
Antibody Screen: POSITIVE
DAT, IgG: POSITIVE
Donor AG Type: NEGATIVE
Donor AG Type: NEGATIVE
Unit division: 0
Unit division: 0

## 2020-12-15 LAB — BPAM RBC
Blood Product Expiration Date: 202210222359
Blood Product Expiration Date: 202210222359
ISSUE DATE / TIME: 202210081001
ISSUE DATE / TIME: 202210081001
Unit Type and Rh: 7300
Unit Type and Rh: 7300

## 2020-12-17 LAB — JAK2 (INCLUDING V617F AND EXON 12), MPL,& CALR-NEXT GEN SEQ

## 2020-12-21 ENCOUNTER — Ambulatory Visit: Payer: Medicare Other | Attending: Internal Medicine | Admitting: Physical Therapy

## 2020-12-21 ENCOUNTER — Ambulatory Visit: Payer: Medicare Other | Admitting: Occupational Therapy

## 2020-12-21 ENCOUNTER — Inpatient Hospital Stay (HOSPITAL_BASED_OUTPATIENT_CLINIC_OR_DEPARTMENT_OTHER): Payer: Medicare Other | Admitting: Oncology

## 2020-12-21 ENCOUNTER — Other Ambulatory Visit: Payer: Self-pay

## 2020-12-21 ENCOUNTER — Encounter: Payer: Self-pay | Admitting: Physical Therapy

## 2020-12-21 ENCOUNTER — Inpatient Hospital Stay: Payer: Medicare Other

## 2020-12-21 ENCOUNTER — Encounter: Payer: Self-pay | Admitting: Oncology

## 2020-12-21 VITALS — BP 127/86 | HR 121 | Temp 98.8°F | Resp 18 | Ht 66.0 in | Wt 209.5 lb

## 2020-12-21 DIAGNOSIS — R258 Other abnormal involuntary movements: Secondary | ICD-10-CM

## 2020-12-21 DIAGNOSIS — R278 Other lack of coordination: Secondary | ICD-10-CM

## 2020-12-21 DIAGNOSIS — G35 Multiple sclerosis: Secondary | ICD-10-CM

## 2020-12-21 DIAGNOSIS — R2681 Unsteadiness on feet: Secondary | ICD-10-CM | POA: Insufficient documentation

## 2020-12-21 DIAGNOSIS — R29818 Other symptoms and signs involving the nervous system: Secondary | ICD-10-CM | POA: Insufficient documentation

## 2020-12-21 DIAGNOSIS — R2689 Other abnormalities of gait and mobility: Secondary | ICD-10-CM | POA: Diagnosis not present

## 2020-12-21 DIAGNOSIS — M6281 Muscle weakness (generalized): Secondary | ICD-10-CM

## 2020-12-21 DIAGNOSIS — G8929 Other chronic pain: Secondary | ICD-10-CM | POA: Insufficient documentation

## 2020-12-21 DIAGNOSIS — D649 Anemia, unspecified: Secondary | ICD-10-CM | POA: Diagnosis not present

## 2020-12-21 DIAGNOSIS — R296 Repeated falls: Secondary | ICD-10-CM

## 2020-12-21 DIAGNOSIS — M545 Low back pain, unspecified: Secondary | ICD-10-CM

## 2020-12-21 DIAGNOSIS — R269 Unspecified abnormalities of gait and mobility: Secondary | ICD-10-CM

## 2020-12-21 LAB — CBC WITH DIFFERENTIAL (CANCER CENTER ONLY)
Abs Immature Granulocytes: 0.09 10*3/uL — ABNORMAL HIGH (ref 0.00–0.07)
Basophils Absolute: 0 10*3/uL (ref 0.0–0.1)
Basophils Relative: 0 %
Eosinophils Absolute: 0 10*3/uL (ref 0.0–0.5)
Eosinophils Relative: 0 %
HCT: 24.3 % — ABNORMAL LOW (ref 36.0–46.0)
Hemoglobin: 8.3 g/dL — ABNORMAL LOW (ref 12.0–15.0)
Immature Granulocytes: 2 %
Lymphocytes Relative: 2 %
Lymphs Abs: 0.1 10*3/uL — ABNORMAL LOW (ref 0.7–4.0)
MCH: 30.9 pg (ref 26.0–34.0)
MCHC: 34.2 g/dL (ref 30.0–36.0)
MCV: 90.3 fL (ref 80.0–100.0)
Monocytes Absolute: 0.3 10*3/uL (ref 0.1–1.0)
Monocytes Relative: 8 %
Neutro Abs: 3.9 10*3/uL (ref 1.7–7.7)
Neutrophils Relative %: 88 %
Platelet Count: 399 10*3/uL (ref 150–400)
RBC: 2.69 MIL/uL — ABNORMAL LOW (ref 3.87–5.11)
RDW: 13.3 % (ref 11.5–15.5)
WBC Count: 4.4 10*3/uL (ref 4.0–10.5)
nRBC: 0 % (ref 0.0–0.2)

## 2020-12-21 LAB — DIRECT ANTIGLOBULIN TEST (NOT AT ARMC)
DAT, IgG: POSITIVE
DAT, complement: POSITIVE

## 2020-12-21 LAB — RETICULOCYTES
RBC.: 2.75 MIL/uL — ABNORMAL LOW (ref 3.87–5.11)
Retic Ct Pct: <0.4 % — ABNORMAL LOW (ref 0.4–3.1)

## 2020-12-21 LAB — LACTATE DEHYDROGENASE: LDH: 267 U/L — ABNORMAL HIGH (ref 98–192)

## 2020-12-21 NOTE — Therapy (Signed)
[facility]. [place], [place], [address] Phone: [phone]   Fax:  [phone]  Physical Therapy Evaluation  Patient Details  Name: Dawn Peters MRN: [id number] Date of Birth: 02-Mar-1981 No data recorded  Encounter Date: 12/21/2020   PT End of Session - 12/21/20 1855     Visit Number 1    Number of Visits 17    Date for PT Re-Evaluation 02/15/21    PT Start Time 0017    PT Stop Time 4944    PT Time Calculation (min) 48 min    Activity Tolerance Patient tolerated treatment well;Patient limited by fatigue    Behavior During Therapy [facility] for tasks assessed/performed             Past Medical History:  Diagnosis Date   Anticoagulant long-term use    Eliquis   Anxiety    Chronic pain    CKD (chronic kidney disease), stage III ([place])    Depression    DVT, lower extremity, 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   left lower extremity-- treated with eliquis   Gait disturbance    due to MS   GERD (gastroesophageal reflux disease)    watch diet   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/p  THA   History of DVT of lower extremity 2007   History of encephalopathy 96/7591   acute metabolic encephalopathy secondary to MS,  resolved   History of MRSA infection 2004   right hip infection post THA   History of pulmonary embolus (PE) 2005   Hydronephrosis, left    w/ acute kidney injury 02/ 2019   Hypertension    Left-sided weakness    due to MS   Migraines    MS (multiple sclerosis) [facility]) neurologist-  dr Renne Musca   dx 2000--   Muscle spasticity    Neurogenic bladder    due to MS   Pulmonary embolism, bilateral ([place]) 09/17/2017   treated w/ eliquis   Strains to urinate    Urgency of urination     Past Surgical History:  Procedure Laterality Date   CYSTOSCOPY WITH RETROGRADE PYELOGRAM, URETEROSCOPY AND STENT PLACEMENT Bilateral 11/29/2017   Procedure:  BILATERAL  RETROGRADE PYELOGRAM, LEFT DIAGNOSIC URETEROSCOPY AND STENT LEFT PLACEMENT;  Surgeon: Ardis Hughs, MD;  Location: [facility];  Service: Urology;  Laterality: Bilateral;   HIP ARTHROSCOPY Left 07-05-2013   dr pill @[facility]    iliopsosas release, synovectomy   REVISION TOTAL HIP ARTHROPLASTY Right 03-28-2003    dr Alvan Dame @[facility]    TOTAL HIP ARTHROPLASTY Bilateral left 11-05-2002  dr Alvan Dame @[facility] ;   right 06/ 2004  @[facility]     There were no vitals filed for this visit.    Subjective Assessment - 12/21/20 1711     Subjective Patient reports that she had a fall at home, went to hospital where she was found to be anemic and also she noted increased LLE weakness. She was given PRBC and steriods with improvement in her leg strength. Occasional pain in LLE.    How long can you stand comfortably? About 20 minutes. Develops back pain and bends forward.    How long can you walk comfortably? Very limited, walks around the house, but very littel ambulation outside the house.    Patient Stated Goals Improve balance, strengthen L side to improve walking.    Currently in Pain? No/denies  [facility] PT Assessment - 12/21/20 0001       Assessment   Medical Diagnosis MS; symptomatic normocytic, normochromic anemia    Onset Date/Surgical Date 11/20/20   Diagnosed w/ MS in Nov 2000   Next MD Visit 12/23/20 (Dr. Felecia Shelling; neuro)    Prior Therapy PT/OT earlier this year      Precautions   Precautions Fall    Other Brace/Splint LLE [place] residence    Living Arrangements Parent;Other relatives    Available Help at Discharge Family    Type of [place] Access Level entry    [place] Two level;Able to live on main level with bedroom/bathroom    [place] - 4 wheels;[place] - single point;Bedside commode      Prior Function   Level of [place] Independent    Vocation On disability       Observation/Other Assessments   Observations Arrived in W/C borrowed from facility      Sensation   Additional Comments Pain and shock in R hand started 1 week ago      Posture/Postural Control   Posture/Postural Control Postural limitations    Postural Limitations Rounded Shoulders;Increased lumbar lordosis;Anterior pelvic tilt    Posture Comments Patient reports back pain and forward lean due to poor postural control      ROM / Strength   AROM / PROM / Strength AROM;Strength      AROM   Overall AROM Comments BUE ROM WNL, BLE WFL      Strength   Strength Assessment Site Hip;Knee;Ankle    Right/Left Hip Right;Left    Right Hip Flexion 4-/5    Right Hip Extension 3-/5    Right Hip ABduction 2+/5    Left Hip Flexion 2/5    Left Hip Extension 2-/5    Left Hip ABduction 2-/5    Right/Left Knee Right;Left    Right Knee Flexion 3+/5    Right Knee Extension 4-/5    Left Knee Flexion 3+/5    Left Knee Extension 3+/5    Right/Left Ankle Right;Left    Right Ankle Dorsiflexion 4/5    Right Ankle Inversion 4-/5    Right Ankle Eversion 4-/5    Left Ankle Dorsiflexion 3/5    Left Ankle Inversion 3+/5    Left Ankle Eversion 3+/5      Ambulation/Gait   Ambulation/Gait Yes    Ambulation/Gait Assistance 5: Supervision    Ambulation/Gait Assistance Details Straight cane, uses Rollator at home    Ambulation Distance (Feet) 80 Feet    Gait Pattern Step-through pattern;Decreased step length - right;Decreased step length - left;Decreased stance time - right;Decreased stance time - left;Decreased stride length;Decreased hip/knee flexion - left;Decreased dorsiflexion - left;Decreased weight shift to right;Decreased weight shift to left;Shuffle      Balance   Balance Assessed No      High Level Balance   High Level Balance Comments Able to sit and weight shift 0-3" over BOS, Static standing with S, hesistant to weight shift.                        Objective measurements  completed on examination: See above findings.                PT Education - 12/21/20 1855     Education Details Educated to inital HEP, POC    Person(s) Educated Patient  Methods Explanation;Demonstration;Handout    Comprehension Verbalized understanding;Returned demonstration              PT Short Term Goals - 12/21/20 1858       PT SHORT TERM GOAL #1   Title Patient will achieve I with initial HEP    Baseline New program provided    Time 2    Period Weeks    Status New    Target Date 01/04/21               PT Long Term Goals - 12/21/20 1902       PT LONG TERM GOAL #1   Title I with advanced HEP    Baseline Initiated    Time 8    Period Weeks    Status New    Target Date 02/15/21      PT LONG TERM GOAL #2   Title Patient will demosntrate improved LLE strength to 3+/5 hip, 4/5 knee, to improve safety in and LLE control in stance.    Baseline Hip 2-3/5, knee 3+-(4-)/5    Time 8    Period Weeks    Status New    Target Date 02/15/21      PT LONG TERM GOAL #3   Title Patient will ambulate x at least 300' with LRAD, L AFO, I    Baseline 30' with Cane, no brace, CGA.    Time 8    Period Weeks    Status New    Target Date 02/15/21      PT LONG TERM GOAL #4   Title Patient will perform TUG in < 20 seconds iwth AD and L AFO.    Baseline N/T, unable    Time 8    Period Weeks    Status New    Target Date 02/15/21                     Patient will benefit from skilled therapeutic intervention in order to improve the following deficits and impairments:     Visit Diagnosis: Other abnormalities of gait and mobility  Muscle weakness (generalized)  Repeated falls  Gait disturbance  Multiple sclerosis (HCC)  Chronic bilateral low back pain without sciatica     Problem List Patient Active Problem List   Diagnosis Date Noted   Symptomatic anemia 11/21/2020   Amenorrhea 11/20/2018   Well woman exam 11/20/2018   MS  (multiple sclerosis) ([place]) 03/14/2018   Stage 3 chronic kidney disease ([place])    Obesity (BMI 30.0-34.9)    Anemia 01/09/2018   Pulmonary embolism ([place]) 09/18/2017   Elevated serum creatinine 05/12/2017   Acute encephalopathy 04/20/2017   Hydronephrosis of left kidney 10/24/2016   Attention deficit 10/24/2016   Left leg weakness 08/13/2016   Chronic pain 08/13/2016   Mild renal insufficiency 08/13/2016   Left-sided weakness    High risk medication use 09/15/2015   Hip pain, bilateral 07/28/2015   Urinary hesitancy 07/28/2015   Multiple sclerosis exacerbation ([place]) 07/17/2015   Urinary disorder 06/11/2015   Gait disturbance 05/19/2015   Numbness 05/19/2015   Depression with anxiety 05/13/2015   Other fatigue 05/13/2015   Left knee pain 05/13/2015   Avascular necrosis of bones of both hips ([place]) 05/13/2015   Screening for HIV (human immunodeficiency virus) 07/08/2014   Essential hypertension, benign 11/19/2013   Anxiety state, unspecified 11/19/2013   Screen for STD (sexually transmitted disease) 11/01/2013   Encounter for routine gynecological examination 11/01/2013   Oral  contraceptive use 10/20/2011   Multiple sclerosis ([place]) 08/08/2007   RASH AND OTHER NONSPECIFIC SKIN ERUPTION 06/22/2007   AVASCULAR NECROSIS, FEMORAL HEAD 03/11/2006   Essential hypertension 02/03/2006   MICROALBUMINURIA 02/03/2006   DVT, HX OF 02/03/2006    Marcelina Morel, DPT 12/21/2020, 7:08 PM  [facility]. [place], [place], [address] Phone: [phone]   Fax:  [phone]  Name: Dawn Peters MRN: [id number] Date of Birth: 01/24/1981

## 2020-12-21 NOTE — Therapy (Signed)
[facility] [place], [place], [address] Phone: [phone]   Fax:  [phone]  Occupational Therapy Evaluation  Patient Details  Name: Dawn Peters MRN: [id number] Date of Birth: [birth year] Referring Provider (OT): Gilles Chiquito, MD   Encounter Date: 12/21/2020   OT End of Session - 12/21/20 1621     Visit Number 1    Number of Visits 17    Date for OT Re-Evaluation 03/21/21    Authorization Type [employer] MCR (primary); MCD of [place] (secondary)    Authorization Time Period VL: [place]    Progress Note Due on Visit 10    OT Start Time 1615    OT Stop Time 1705    OT Time Calculation (min) 50 min    Activity Tolerance Patient tolerated treatment well    Behavior During Therapy [facility] for tasks assessed/performed            Past Medical History:  Diagnosis Date   Anticoagulant long-term use    Eliquis   Anxiety    Chronic pain    CKD (chronic kidney disease), stage III ([place])    Depression    DVT, lower extremity, recurrent ([place]) 09/17/2017   left lower extremity-- treated with eliquis   Gait disturbance    due to MS   GERD (gastroesophageal reflux disease)    watch diet   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/p  THA   History of DVT of lower extremity 2007   History of encephalopathy [id number]/6387   acute metabolic encephalopathy secondary to MS,  resolved   History of MRSA infection 2004   right hip infection post THA   History of pulmonary embolus (PE) 2005   Hydronephrosis, left    w/ acute kidney injury 02/ 2019   Hypertension    Left-sided weakness    due to MS   Migraines    MS (multiple sclerosis) [facility]) neurologist-  dr Renne Musca   dx 2000--   Muscle spasticity    Neurogenic bladder    due to MS   Pulmonary embolism, bilateral ([place]) 09/17/2017   treated w/ eliquis   Strains to urinate    Urgency of urination     Past Surgical History:   Procedure Laterality Date   CYSTOSCOPY WITH RETROGRADE PYELOGRAM, URETEROSCOPY AND STENT PLACEMENT Bilateral 11/29/2017   Procedure: BILATERAL  RETROGRADE PYELOGRAM, LEFT DIAGNOSIC URETEROSCOPY AND STENT LEFT PLACEMENT;  Surgeon: Ardis Hughs, MD;  Location: [facility];  Service: Urology;  Laterality: Bilateral;   HIP ARTHROSCOPY Left 07-05-2013   dr pill @[facility]    iliopsosas release, synovectomy   REVISION TOTAL HIP ARTHROPLASTY Right 03-28-2003    dr Alvan Dame @[facility]    TOTAL HIP ARTHROPLASTY Bilateral left 11-05-2002  dr Alvan Dame @[facility] ;   right 06/ 2004  @[facility]     There were no vitals filed for this visit.   Subjective Assessment - 12/21/20 1617     Subjective  Pt arrives to session today w/ primary concerns related to decreased balance/difficulty w/ ambulation, back pain, and decreased coordination. Pt reports she presented to the ED 11/20/20 when she believed she was having an MS exacerbation due to progressively worsening L-sided weakness and dizziness. Pt was admitted to the hospital for further testing when imaging did not indicate a new lesion and bloodwork indicated very low Hb, consistent w/ symptomatic anemia. Pt states she received 5 blood transfusions in the  hospital and her symptoms have improved some, but that she feels she has "a lot to work on" in therapy.    Pertinent History Relapsing remitting MS dx in 2000; symptomatic anemia dx in Sept 2022; PMH includes CKD stage III, HTN, migraines, anxiety, hx of DVT, and bilateral THA in 2004    Patient Stated Goals Strengthen my L side; "get my energy back up"    Currently in Pain? Yes    Pain Score 7     Pain Location Leg    Pain Orientation Left    Pain Descriptors / Indicators Sharp    Pain Type Chronic pain    Pain Onset More than a month ago    Pain Frequency Intermittent    Aggravating Factors  Standing    Pain Relieving Factors Sitting; prescription pain medication    Effect of Pain on Daily Activities Only able  to tolerate short distances w/ walking             [facility] OT Assessment - 12/21/20 1622       Assessment   Medical Diagnosis MS; symptomatic normocytic, normochromic anemia    Referring Provider (OT) Gilles Chiquito, MD    Onset Date/Surgical Date 11/20/20   Diagnosed w/ MS in Nov 2000   Hand Dominance Right    Next MD Visit 12/23/20 (Dr. Felecia Shelling; neuro)    Prior Therapy PT/OT earlier this year      Precautions   Precautions Fall    Other Brace/Splint LLE AFO      Balance Screen   Has the patient fallen in the past 6 months Yes    How many times? 2    Has the patient had a decrease in activity level because of a fear of falling?  No    Is the patient reluctant to leave their home because of a fear of falling?  No      Home  Environment   Type of Home House    Home Access Level entry    Home Layout Able to live on main level with bedroom/bathroom    Bathroom Shower/Tub Tub/Shower unit;Walk-in Shower    Adaptive equipment Reacher;Long-handled sponge    Research scientist (life sciences) - 4 wheels;Cane - single point;Shower Hydrographic surveyor is a 3-in-1   Lives With Family   brother and mom     Prior Function   Level of Independence Independent    Vocation On disability      ADL   ADL comments Pt reports she is able to complete all BADLs at [employer] I level at this time      IADL   [employer] independently for [employer] All laundry must be done by others;Performs light daily tasks such as dishwashing, bed making    Meal Prep Able to complete simple warm meal prep;Able to complete simple cold meal and snack prep    Community Mobility Drives own vehicle    Medication Management Is responsible for taking medication in correct dosages at correct time    Physiological scientist financial matters independently (budgets, writes checks, pays rent, bills goes to bank), collects and keeps track of income      Mobility   Mobility Status Needs assist    Mobility  Status Comments Uses single-point cane mostly; using RW or motorized scooter for community mobility      Written Expression   Dominant Hand Right    Handwriting 90% legible;Increased time  Increased success w/ weighted, built-up handle pen     Vision - History   Baseline Vision No visual deficits      Vision Assessment   Ocular Range of Motion Within Functional Limits    Tracking/Visual Pursuits Decreased smoothness of horizontal tracking   to L side   Saccades Within functional limits    Visual Fields No apparent deficits      Activity Tolerance   Activity Tolerance Tolerates 30 min activity with multiple rests      Cognition   Overall Cognitive Status Within Functional Limits for tasks assessed      Observation/Other Assessments   Physical Performance Test   Yes   Writing a sentence in 16 sec   Simulated Eating Time (seconds) 22      Sensation   Light Touch Appears Intact    Hot/Cold Appears Intact    Additional Comments Pt reports feeling a "shocking" sensation in the palm of her R hand and sometimes her fingertips that started approx 1 week ago      Coordination   Gross Motor Movements are Fluid and Coordinated Yes    Fine Motor Movements are Fluid and Coordinated No    Finger Nose Finger Test Mild ataxia    9 Hole Peg Test Right;Left    Right 9 Hole Peg Test 58 sec w/ multiple drops    Left 9 Hole Peg Test 54 sec w/ multiple drops    Tremors Intention tremor; RUE more than LUE      AROM   Overall AROM  Within functional limits for tasks performed    AROM Assessment Site Shoulder;Elbow;Forearm;Wrist      Strength   Strength Assessment Site Shoulder;Elbow;Forearm    Right/Left Shoulder Right;Left    Right Shoulder Flexion 4+/5    Right Shoulder Extension 4+/5    Right Shoulder ABduction 4/5    Left Shoulder Flexion 4/5    Left Shoulder Extension 4+/5    Left Shoulder ABduction 4/5    Right/Left Elbow Right;Left    Right Elbow Flexion 4+/5    Right Elbow  Extension 4+/5    Left Elbow Flexion 4+/5    Left Elbow Extension 4+/5    Right/Left Forearm --      Hand Function   Right Hand Grip (lbs) 66    Left Hand Grip (lbs) 63      LLE Tone   LLE Tone Other (comment)   Pt reports muscle spasms in LLE; on baclofen            OT Education - 12/21/20 1620     Education Details Education provided on role and purpose of OT, as well as potential interventions and goals for therapy based on initial evaluation findings. Provided overview of energy conservation strategies w/ handout administered.    Person(s) Educated Patient    Methods Explanation;Handout    Comprehension Verbalized understanding             OT Short Term Goals - 12/21/20 1755       OT SHORT TERM GOAL #1   Title Pt will verbalize understanding of at least 4 energy conservation strategies (planning, pacing, prioritizing, positioning) to assist w/ management of generalized fatigue and improve participation in IADLs    Baseline Difficulty tolerating >30 min of activity w/out significant rest breaks    Time 4    Period Weeks    Status New    Target Date 01/22/21      OT SHORT TERM  GOAL #2   Title Pt will demonstrate independence w/ compensatory strategies, including AE prn, for functional FM tasks (clothing manipulatives, self-feeding, handwriting, etc.)    Baseline Decreased strength and coordination impacting functional use of BUEs    Time 4    Period Weeks    Status New      OT SHORT TERM GOAL #3   Title Pt will verbalize understanding of methods to access appropriate community resources/fitness activities/support groups    Baseline --    Time 4    Period Weeks    Status New             OT Long Term Goals - 12/21/20 1755       OT LONG TERM GOAL #1   Title Pt will be able to complete multi-step simulated cooking activity w/ Mod I, incorporating compensatory strategies and/or adaptive equipment prn    Baseline Reports previously enjoying cooking; now  only completing simple warm/cold meal and snack prep    Time 8    Period Weeks    Status New    Target Date 02/19/21      OT LONG TERM GOAL #2   Title Pt will be able to complete 9-Hole Peg Test w/ no more than 5 drops in any amount of time, using compensatory strategies prn    Baseline RUE 58 sec; LUE 54 sec -- multiple drops w/ both UEs    Time 8    Period Weeks    Status New      OT LONG TERM GOAL #3   Title Pt will complete simulated IADL task (housekeeping, laundry, meal prep, etc.) while in standing, incorporating compensatory/energy conservation strategies, and less than 2 rest breaks by d/c    Baseline Decreased balance and activity tolerance    Time 8    Period Weeks    Status New      OT LONG TERM GOAL #4   Title Pt will improve participation in self-feeding tasks as evidenced by decreasing simulated eating time to at least 19 sec, incorporating adaptive strategies or equipment prn    Baseline Simulated eating completed in 22 sec    Time 8    Period Weeks    Status New             Plan - 12/21/20 1747     Clinical Impression Statement Pt is a 40 y/o female who presents to OP OT due to relapsing remitting MS w/ residual L-sided weakness. Pt currently lives with family in a single-level home and is currently on disability. PMHx includes CKD stage III, HTN, migraines, anxiety, hx of DVT, bilateral THA in 2004, and recent history of symptomatic anemia dx in Sept 2022 w/ normocytic, normochromic and hypoproliferative component. Pt demonstrates decreased balance and coordination impacting participation in functional activities, and will benefit from skilled occupational therapy services to address these limitations, as well as GM/FM control and coordination, pain management, altered sensation, balance, energy conservation, introduction of compensatory strategies/AE prn, visual-perception, and implementation of an HEP to improve quality of life and participation and safety during  ADLs.    OT Occupational Profile and History Detailed Assessment- Review of Records and additional review of physical, cognitive, psychosocial history related to current functional performance    Occupational performance deficits (Please refer to evaluation for details): ADL's;IADL's;Work;Leisure;Social Participation    Body Structure / Function / Physical Skills Gait;Strength;Tone;Pain;GMC;Dexterity;Balance;Body mechanics;UE functional use;IADL;Endurance;Sensation;Mobility;Coordination;FMC;Muscle spasms    Rehab Potential Good    Clinical Decision Making Multiple  treatment options, significant modification of task necessary    Comorbidities Affecting Occupational Performance: Presence of comorbidities impacting occupational performance    Modification or Assistance to Complete Evaluation  Min-Moderate modification of tasks or assist with assess necessary to complete eval    OT Frequency 2x / week    OT Duration 8 weeks    OT Treatment/Interventions Self-care/ADL training;Aquatic Therapy;Biofeedback;Cryotherapy;Moist Heat;Energy conservation;Neuromuscular education;Therapeutic exercise;DME and/or AE instruction;Functional Mobility Training;Manual Therapy;Patient/family education;Passive range of motion;Visual/perceptual remediation/compensation;Therapeutic activities;Balance training    Plan Review goals w/ pt; discuss energy conservation strategies for in-home tasks (e.g. cooking) and potential benefit of AE for [facility]    Recommended Other Services Pt currently receiving PT at this location    Consulted and Agree with Plan of Care Patient            Patient will benefit from skilled therapeutic intervention in order to improve the following deficits and impairments:   Body Structure / Function / Physical Skills: Gait, Strength, Tone, Pain, GMC, Dexterity, Balance, Body mechanics, UE functional use, IADL, Endurance, Sensation, Mobility, Coordination, FMC, Muscle spasms   Visit Diagnosis: Other  lack of coordination  Unsteadiness on feet  Other abnormal involuntary movements  Other symptoms and signs involving the nervous system   Problem List Patient Active Problem List   Diagnosis Date Noted   Symptomatic anemia 11/21/2020   Amenorrhea 11/20/2018   Well woman exam 11/20/2018   MS (multiple sclerosis) ([place]) 03/14/2018   Stage 3 chronic kidney disease ([place])    Obesity (BMI 30.0-34.9)    Anemia 01/09/2018   Pulmonary embolism ([place]) 09/18/2017   Elevated serum creatinine 05/12/2017   Acute encephalopathy 04/20/2017   Hydronephrosis of left kidney 10/24/2016   Attention deficit 10/24/2016   Left leg weakness 08/13/2016   Chronic pain 08/13/2016   Mild renal insufficiency 08/13/2016   Left-sided weakness    High risk medication use 09/15/2015   Hip pain, bilateral 07/28/2015   Urinary hesitancy 07/28/2015   Multiple sclerosis exacerbation ([place]) 07/17/2015   Urinary disorder 06/11/2015   Gait disturbance 05/19/2015   Numbness 05/19/2015   Depression with anxiety 05/13/2015   Other fatigue 05/13/2015   Left knee pain 05/13/2015   Avascular necrosis of bones of both hips ([place]) 05/13/2015   Screening for HIV (human immunodeficiency virus) 07/08/2014   Essential hypertension, benign 11/19/2013   Anxiety state, unspecified 11/19/2013   Screen for STD (sexually transmitted disease) 11/01/2013   Encounter for routine gynecological examination 11/01/2013   Oral contraceptive use 10/20/2011   Multiple sclerosis ([place]) 08/08/2007   RASH AND OTHER NONSPECIFIC SKIN ERUPTION 06/22/2007   AVASCULAR NECROSIS, FEMORAL HEAD 03/11/2006   Essential hypertension 02/03/2006   MICROALBUMINURIA 02/03/2006   DVT, HX OF 02/03/2006    Kathrine Cords, OTR/L, MSOT 12/21/2020, 6:00 PM  [address] [place], [place], [address] Phone: [phone]   Fax:  [phone]  Name: Dawn Peters MRN: [id number] Date of  Birth: December 31, 1980

## 2020-12-21 NOTE — Progress Notes (Signed)
Hematology and Oncology Follow Up Visit  Dawn Peters [id number] 1980-05-27 40 y.o. 12/21/2020 10:43 AM Dawn Hy, PA-CBujanowski, [place], [place]*   Principle Diagnosis: 40 year old with anemia diagnosed in September 2022.  She was found to have normocytic, normochromic and hypoproliferative component.  Prior work-up: Bone marrow biopsy obtained on 11/23/2020 showed hypercellular marrow with trilineage hematopoiesis and fibrosis.   NGS for JAK2, MPL and CALR showed no detectable genetic alteration on December 16, 2020.   Current therapy:  Retacrit 10,000 units started on December 11, 2020.  Packed red cell transfusion as needed.    Interim History: Dawn Peters returns today for a follow-up visit.  She is a 40 year old woman I saw in consultation for hypoproliferative anemia.  She required packed red cell transfusion and received Aranesp 40 mcg on September 22 and Retacrit on October 7.  Since that time, she did require repeat transfusion on December 12, 2020.  He reports of feeling better at this time and does not report any headaches or dizziness.  He does not report any shortness of breath or difficulty breathing.  She denies any worsening of neurological deficits at this time and she is back to her baseline function.  He has resumed physical therapy at this time and able to drive.     Medications: I have reviewed the patient's current medications.  Current Outpatient Medications  Medication Sig Dispense Refill   acetaminophen (TYLENOL) 500 MG tablet Take 1,000 mg by mouth every 6 (six) hours as needed for moderate pain or headache.     amantadine (SYMMETREL) 100 MG capsule Take 100 mg by mouth 2 (two) times daily.     amitriptyline (ELAVIL) 25 MG tablet TAKE 1 TO 2 TABLETS(25 TO 50 MG) BY MOUTH AT BEDTIME (Patient taking differently: Take 25-50 mg by mouth at bedtime.) 180 tablet 1   amLODipine (NORVASC) 5 MG tablet Take 1 tablet (5 mg total) by mouth daily. (Patient taking  differently: Take 5 mg by mouth daily after breakfast.) 90 tablet 0   amphetamine-dextroamphetamine (ADDERALL XR) 20 MG 24 hr capsule Take 1 capsule (20 mg total) by mouth daily. 30 capsule 0   apixaban (ELIQUIS) 5 MG TABS tablet Take 1 tablet (5 mg total) by mouth 2 (two) times daily. 60 tablet 5   baclofen (LIORESAL) 20 MG tablet Take 1 tablet (20 mg total) by mouth 4 (four) times daily as needed for muscle spasms. 360 tablet 3   busPIRone (BUSPAR) 15 MG tablet Take 1 tablet (15 mg total) by mouth 2 (two) times daily. Must keep follow up 09/21/20 for ongoing refills 60 tablet 1   citalopram (CELEXA) 20 MG tablet Take 1 tablet (20 mg total) by mouth daily. 30 tablet 11   dalfampridine 10 MG TB12 Take 1 tablet (10 mg total) by mouth 2 (two) times daily. 60 tablet 11   Darbepoetin Alfa (ARANESP) 40 MCG/0.4ML SOSY injection Inject 0.4 mLs (40 mcg total) into the skin every 30 (thirty) days. 0.4 mL 0   gabapentin (NEURONTIN) 800 MG tablet Take 1 tablet (800 mg total) by mouth 4 (four) times daily. 120 tablet 11   metoprolol succinate (TOPROL-XL) 50 MG 24 hr tablet Take 50 mg by mouth daily.     Multiple Vitamin (MULTIVITAMIN WITH MINERALS) TABS tablet Take 1 tablet by mouth daily. 30 tablet 0   norethindrone (MICRONOR) 0.35 MG tablet Take 1 tablet (0.35 mg total) by mouth daily. 1 Package 11   omeprazole (PRILOSEC) 40 MG capsule Take 40 mg by  mouth daily.     oxyCODONE-acetaminophen (PERCOCET) 10-325 MG tablet Take 1 tablet by mouth every 4 (four) hours.     Ozanimod HCl (ZEPOSIA) 0.92 MG CAPS TAKE ONE CAPSULE (0.$RemoveBefore'92MG'bKHIlGEpewDDj$ )  BY MOUTH ONE TIME DAILY (Patient taking differently: Take 0.92 mg by mouth daily.) 30 capsule 11   sucralfate (CARAFATE) 1 g tablet Take 1 tablet (1 g total) by mouth 4 (four) times daily -  with meals and at bedtime. 40 tablet 0   SUMAtriptan (IMITREX) 100 MG tablet TAKE 1 TABLET BY MOUTH 1 TIME FOR UP TO 1 DOSE AS NEEDED FOR MIGRAINE. MAY REPEAT IN 2 HOURS IF HEADACHE PERSISTS OR  RECURS (Patient taking differently: Take 100 mg by mouth daily as needed for migraine.) 10 tablet 5   tamsulosin (FLOMAX) 0.4 MG CAPS capsule TAKE 1 CAPSULE(0.4 MG) BY MOUTH DAILY (Patient taking differently: Take 0.4 mg by mouth daily.) 30 capsule 11   tiZANidine (ZANAFLEX) 4 MG tablet TAKE 1 TABLET BY MOUTH EVERY NIGHT AT BEDTIME (Patient taking differently: Take 4 mg by mouth at bedtime.) 30 tablet 10   No current facility-administered medications for this visit.   Facility-Administered Medications Ordered in Other Visits  Medication Dose Route Frequency Provider Last Rate Last Admin   acetaminophen (TYLENOL) 325 MG tablet            diphenhydrAMINE (BENADRYL) 25 mg capsule              Allergies:  Allergies  Allergen Reactions   Ace Inhibitors Swelling   Amoxicillin Itching    Did it involve swelling of the face/tongue/throat, SOB, or low BP? Yes Did it involve sudden or severe rash/hives, skin peeling, or any reaction on the inside of your mouth or nose? No Did you need to seek medical attention at a hospital or doctor's office? No When did it last happen?      unknown  If all above answers are "NO", may proceed with cephalosporin use.;   Lisinopril Swelling    Lower lip swelling      Physical Exam:  ECOG:    General appearance: Comfortable appearing without any discomfort Head: Normocephalic without any trauma Oropharynx: Mucous membranes are moist and pink without any thrush or ulcers. Eyes: Pupils are equal and round reactive to light. Lymph nodes: No cervical, supraclavicular, inguinal or axillary lymphadenopathy.   Heart:regular rate and rhythm.  S1 and S2 without leg edema. Lung: Clear without any rhonchi or wheezes.  No dullness to percussion. Abdomin: Soft, nontender, nondistended with good bowel sounds.  No hepatosplenomegaly. Musculoskeletal: No joint deformity or effusion.  Full range of motion noted. Neurological: No deficits noted on motor, sensory and  deep tendon reflex exam. Skin: No petechial rash or dryness.  Appeared moist.     Lab Results: Lab Results  Component Value Date   WBC 5.1 12/11/2020   HGB 6.8 (LL) 12/11/2020   HCT 20.5 (L) 12/11/2020   MCV 93.2 12/11/2020   PLT 306 12/11/2020     Chemistry      Component Value Date/Time   NA 138 11/26/2020 0021   NA 144 06/21/2017 1626   K 3.6 11/26/2020 0021   CL 105 11/26/2020 0021   CO2 25 11/26/2020 0021   BUN 32 (H) 11/26/2020 0021   BUN 16 06/21/2017 1626   CREATININE 1.58 (H) 11/26/2020 0021   CREATININE 0.85 07/03/2014 0959      Component Value Date/Time   CALCIUM 9.0 11/26/2020 0021   ALKPHOS 104 11/23/2020 0218  AST 19 11/23/2020 0218   ALT 18 11/23/2020 0218   BILITOT 0.6 11/23/2020 0218   BILITOT 0.5 01/11/2018 1542          Impression and Plan:  40 year old woman with:  1.  Normocytic, normochromic anemia without any active bleeding and low absolute reticulocyte count.  This was detected in September 2022 after presenting with hemoglobin of 5.0.  The differential diagnosis at this time.  The bone marrow biopsy did not show any infiltrative bone marrow process with abundance of erythroid precursors but prominently left shifted maturation and decreased late precursors.  No vital changes or granulomas identified.  It is unclear at this point that her bone marrow findings are primary hematological disorder versus a secondary infectious or autoimmune process.  I am completing her work-up today to rule out hemolytic process which does not appear to be the case given her low erythropoietin.  Her hemoglobin today did improve after transfusion and repeat Retacrit.  She does not require transfusion at this time.  She is currently receiving supportive care with transfusion and growth factor support.  Risks and benefits of continuing this treatment were discussed and she is agreeable to continue.   2.  Multiple sclerosis: She had a lower extremity weakness  related to a flare and required IV steroids with improvement at this time.  3.  Venous thromboembolism dating back to 2014.  She is currently anticoagulated Eliquis.  4.  Chronic renal sufficiency: Baseline kidney function currently with creatinine clearance around 40 cc/min.  5.  Follow-up: Will be next week to receive her next Retacrit injection and every 3 weeks after that.  We will continue to assess her response in the process.  30  minutes were dedicated to this visit. The time was spent on reviewing laboratory data, discussing treatment options, discussing differential diagnosis and answering questions regarding future plan.   Zola Button, MD 10/17/202210:43 AM

## 2020-12-21 NOTE — Patient Instructions (Signed)
Access Code: [email or web address] URL: [email or web address] Date: 12/21/2020 Prepared by: Ethel Rana  Exercises Supine Bridge - 1 x daily - 7 x weekly - 1 sets - 10 reps Supine Lower Trunk Rotation - 1 x daily - 7 x weekly - 1 sets - 10 reps Hooklying Clamshell with Resistance - 1 x daily - 7 x weekly - 1 sets - 10 reps Supine Hip Adduction Isometric with Ball - 1 x daily - 7 x weekly - 1 sets - 10 reps Seated Long Arc Quad - 1 x daily - 7 x weekly - 1 sets - 10 reps Supine Heel Slide - 1 x daily - 7 x weekly - 1 sets - 10 reps

## 2020-12-22 LAB — HAPTOGLOBIN: Haptoglobin: 201 mg/dL (ref 42–296)

## 2020-12-23 ENCOUNTER — Ambulatory Visit: Payer: Medicare Other | Admitting: Neurology

## 2020-12-23 ENCOUNTER — Encounter: Payer: Self-pay | Admitting: Neurology

## 2020-12-24 ENCOUNTER — Telehealth: Payer: Self-pay | Admitting: Neurology

## 2020-12-24 NOTE — Telephone Encounter (Signed)
Called pt. Advised Dr. Felecia Shelling reviewed her chart. He is going to reach out to Dr. Alen Blew that she saw on 12/21/20 to discuss things. She had to cx September appt. R/s her appt for a sooner one on 01/18/21 at 2:30p. Cx one made for January.

## 2020-12-24 NOTE — Telephone Encounter (Signed)
Pt has called to report that her hemoglobin levels have dropped really low.  Pt states in the last month she has had 5 blood transfusions.  Pt is asking for a call to discuss.

## 2020-12-26 ENCOUNTER — Emergency Department (HOSPITAL_BASED_OUTPATIENT_CLINIC_OR_DEPARTMENT_OTHER)
Admission: EM | Admit: 2020-12-26 | Discharge: 2020-12-26 | Disposition: A | Discharge status: Home / Self Care | Payer: Medicare Other | Attending: Emergency Medicine | Admitting: Emergency Medicine

## 2020-12-26 ENCOUNTER — Other Ambulatory Visit: Payer: Self-pay

## 2020-12-26 ENCOUNTER — Encounter (HOSPITAL_BASED_OUTPATIENT_CLINIC_OR_DEPARTMENT_OTHER): Payer: Self-pay | Admitting: Emergency Medicine

## 2020-12-26 DIAGNOSIS — N183 Chronic kidney disease, stage 3 unspecified: Secondary | ICD-10-CM | POA: Diagnosis not present

## 2020-12-26 DIAGNOSIS — L98419 Non-pressure chronic ulcer of buttock with unspecified severity: Secondary | ICD-10-CM

## 2020-12-26 DIAGNOSIS — Z79899 Other long term (current) drug therapy: Secondary | ICD-10-CM | POA: Insufficient documentation

## 2020-12-26 DIAGNOSIS — L0231 Cutaneous abscess of buttock: Secondary | ICD-10-CM | POA: Diagnosis not present

## 2020-12-26 DIAGNOSIS — I129 Hypertensive chronic kidney disease with stage 1 through stage 4 chronic kidney disease, or unspecified chronic kidney disease: Secondary | ICD-10-CM | POA: Diagnosis not present

## 2020-12-26 DIAGNOSIS — G35 Multiple sclerosis: Secondary | ICD-10-CM | POA: Diagnosis not present

## 2020-12-26 MED ORDER — OXYCODONE-ACETAMINOPHEN 5-325 MG PO TABS
1.0000 | ORAL_TABLET | Freq: Once | ORAL | Status: AC
Start: 2020-12-26 — End: 2020-12-26
  Administered 2020-12-26: 1 via ORAL
  Filled 2020-12-26: qty 1

## 2020-12-26 MED ORDER — DOXYCYCLINE HYCLATE 100 MG PO CAPS: 100.0000 mg | ORAL_CAPSULE | Freq: Two times a day (BID) | ORAL | 0 refills | Status: DC

## 2020-12-26 NOTE — ED Provider Notes (Addendum)
[facility] EMERGENCY DEPT Provider Note   CSN: [id number] Arrival date & time: 12/26/20  1606     History Chief Complaint  Patient presents with   Abscess    Dawn Peters is a 40 y.o. female who presents to the emergency department for further evaluation of an abscess localized to the buttock and gluteal cleft that has been constant worsening over the last 2 weeks.  She has been seen evaluated her primary care doctor twice and was given tramadol and hydrocortisone cream.  These have offered no improvement.  She rates her pain severe in severity.  She denies any fever, chills, rectal pain, hematochezia, abdominal pain, nausea, vomiting, diarrhea.  The history is provided by the patient.  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   left lower extremity-- treated with eliquis   Gait disturbance    due to MS   GERD (gastroesophageal reflux disease)    watch diet   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/p  THA   History of DVT of lower extremity 2007   History of encephalopathy [id number]/6295   acute metabolic encephalopathy secondary to MS,  resolved   History of MRSA infection 2004   right hip infection post THA   History of pulmonary embolus (PE) 2005   Hydronephrosis, left    w/ acute kidney injury 02/ 2019   Hypertension    Left-sided weakness    due to MS   Migraines    MS (multiple sclerosis) [facility]) neurologist-  dr Renne Musca   dx 2000--   Muscle spasticity    Neurogenic bladder    due to MS   Pulmonary embolism, bilateral ([place]) 09/17/2017   treated w/ eliquis   Strains to urinate    Urgency of urination     Patient Active Problem List   Diagnosis Date Noted   Symptomatic anemia 11/21/2020    Amenorrhea 11/20/2018   Well woman exam 11/20/2018   MS (multiple sclerosis) ([place]) 03/14/2018   Stage 3 chronic kidney disease ([place])    Obesity (BMI 30.0-34.9)    Anemia 01/09/2018   Pulmonary embolism ([place]) 09/18/2017   Elevated serum creatinine 05/12/2017   Acute encephalopathy 04/20/2017   Hydronephrosis of left kidney 10/24/2016   Attention deficit 10/24/2016   Left leg weakness 08/13/2016   Chronic pain 08/13/2016   Mild renal insufficiency 08/13/2016   Left-sided weakness    High risk medication use 09/15/2015   Hip pain, bilateral 07/28/2015   Urinary hesitancy 07/28/2015   Multiple sclerosis exacerbation ([place]) 07/17/2015   Urinary disorder 06/11/2015   Gait disturbance 05/19/2015   Numbness 05/19/2015   Depression with anxiety 05/13/2015   Other fatigue 05/13/2015   Left knee pain 05/13/2015   Avascular necrosis of bones of both hips ([place]) 05/13/2015   Screening for HIV (human immunodeficiency virus) 07/08/2014   Essential hypertension, benign 11/19/2013   Anxiety state, unspecified 11/19/2013   Screen for STD (sexually transmitted disease) 11/01/2013   Encounter for routine gynecological examination 11/01/2013   Oral contraceptive use 10/20/2011   Multiple sclerosis ([place]) 08/08/2007   RASH AND OTHER NONSPECIFIC SKIN ERUPTION 06/22/2007   AVASCULAR NECROSIS, FEMORAL HEAD 03/11/2006   Essential  hypertension 02/03/2006   MICROALBUMINURIA 02/03/2006   DVT, HX OF 02/03/2006    Past Surgical History:  Procedure Laterality Date   CYSTOSCOPY WITH RETROGRADE PYELOGRAM, URETEROSCOPY AND STENT PLACEMENT Bilateral 11/29/2017   Procedure: BILATERAL  RETROGRADE PYELOGRAM, LEFT DIAGNOSIC URETEROSCOPY AND STENT LEFT PLACEMENT;  Surgeon: Ardis Hughs, MD;  Location: [facility];  Service: Urology;  Laterality: Bilateral;   HIP ARTHROSCOPY Left 07-05-2013   dr pill @[facility]    iliopsosas release, synovectomy   REVISION TOTAL HIP ARTHROPLASTY Right 03-28-2003     dr Alvan Dame @[facility]    TOTAL HIP ARTHROPLASTY Bilateral left 11-05-2002  dr Alvan Dame @[facility] ;   right 06/ 2004  @[facility]      OB History     Gravida  1   Para  1   Term  1   Preterm      AB      Living  1      SAB      IAB      Ectopic      Multiple      Live Births  1           Family History  Problem Relation Age of Onset   Healthy Mother    Healthy Father    Breast cancer Other        paternal grandmother dx age 38   Colon cancer Other        paternal grandmother   Hypertension Other    Diabetes Other     Social History   Tobacco Use   Smoking status: Never   Smokeless tobacco: Never  Vaping Use   Vaping Use: Never used  Substance Use Topics   Alcohol use: No   Drug use: No    Home Medications Prior to Admission medications   Medication Sig Start Date End Date Taking? Authorizing Provider  doxycycline (VIBRAMYCIN) 100 MG capsule Take 1 capsule (100 mg total) by mouth 2 (two) times daily. 12/26/20  Yes Raul Del, Clemon Devaul M, PA-C  acetaminophen (TYLENOL) 500 MG tablet Take 1,000 mg by mouth every 6 (six) hours as needed for moderate pain or headache.    [provider]  amantadine (SYMMETREL) 100 MG capsule Take 100 mg by mouth 2 (two) times daily. 09/27/20   [provider]  amitriptyline (ELAVIL) 25 MG tablet TAKE 1 TO 2 TABLETS(25 TO 50 MG) BY MOUTH AT BEDTIME Patient taking differently: Take 25-50 mg by mouth at bedtime. 11/17/20   Sater, Nanine Means, MD  amLODipine (NORVASC) 5 MG tablet Take 1 tablet (5 mg total) by mouth daily. Patient taking differently: Take 5 mg by mouth daily after breakfast. 05/21/17   Arrien, Jimmy Picket, MD  amphetamine-dextroamphetamine (ADDERALL XR) 20 MG 24 hr capsule Take 1 capsule (20 mg total) by mouth daily. 11/05/20   Sater, Nanine Means, MD  apixaban (ELIQUIS) 5 MG TABS tablet Take 1 tablet (5 mg total) by mouth 2 (two) times daily. 09/21/20   Sater, Nanine Means, MD  baclofen (LIORESAL) 20 MG tablet Take 1 tablet (20  mg total) by mouth 4 (four) times daily as needed for muscle spasms. 09/16/19   Sater, Nanine Means, MD  busPIRone (BUSPAR) 15 MG tablet Take 1 tablet (15 mg total) by mouth 2 (two) times daily. Must keep follow up 09/21/20 for ongoing refills 08/18/20   Sater, Nanine Means, MD  citalopram (CELEXA) 20 MG tablet Take 1 tablet (20 mg total) by mouth daily. 02/19/19   Sater, Nanine Means, MD  dalfampridine 10 MG TB12 Take 1 tablet (10 mg total) by mouth 2 (two) times daily. 09/21/20   Sater, Nanine Means, MD  Darbepoetin Alfa (ARANESP) 40 MCG/0.4ML SOSY injection Inject 0.4 mLs (40 mcg total) into the skin every 30 (thirty) days. 11/27/20 12/27/20  [name], MD  gabapentin (NEURONTIN) 800 MG tablet Take 1 tablet (800 mg total) by mouth 4 (four) times daily. 09/21/20   Sater, Nanine Means, MD  metoprolol succinate (TOPROL-XL) 50 MG 24 hr tablet Take 50 mg by mouth daily. 09/27/20   [provider]  Multiple Vitamin (MULTIVITAMIN WITH MINERALS) TABS tablet Take 1 tablet by mouth daily. 09/20/17   Kayleen Memos, DO  norethindrone (MICRONOR) 0.35 MG tablet Take 1 tablet (0.35 mg total) by mouth daily. 11/19/18   Clarnce Flock, MD  omeprazole (PRILOSEC) 40 MG capsule Take 40 mg by mouth daily. 09/27/20   [provider]  oxyCODONE-acetaminophen (PERCOCET) 10-325 MG tablet Take 1 tablet by mouth every 4 (four) hours. 03/18/18   [provider]  Ozanimod HCl (ZEPOSIA) 0.92 MG CAPS TAKE ONE CAPSULE (0.92MG )  BY MOUTH ONE TIME DAILY Patient taking differently: Take 0.92 mg by mouth daily. 03/09/20   Sater, Nanine Means, MD  sucralfate (CARAFATE) 1 g tablet Take 1 tablet (1 g total) by mouth 4 (four) times daily -  with meals and at bedtime. 07/19/19   Molpus, John, MD  SUMAtriptan (IMITREX) 100 MG tablet TAKE 1 TABLET BY MOUTH 1 TIME FOR UP TO 1 DOSE AS NEEDED FOR MIGRAINE. MAY REPEAT IN 2 HOURS IF HEADACHE PERSISTS OR RECURS Patient taking differently: Take 100 mg by mouth daily as needed for migraine.  09/21/20   Sater, Nanine Means, MD  tamsulosin (FLOMAX) 0.4 MG CAPS capsule TAKE 1 CAPSULE(0.4 MG) BY MOUTH DAILY Patient taking differently: Take 0.4 mg by mouth daily. 08/05/20   Sater, Nanine Means, MD  tiZANidine (ZANAFLEX) 4 MG tablet TAKE 1 TABLET BY MOUTH EVERY NIGHT AT BEDTIME Patient taking differently: Take 4 mg by mouth at bedtime. 08/05/20   Sater, Nanine Means, MD  Oxcarbazepine (TRILEPTAL) 300 MG tablet Take 1 tablet (300 mg total) by mouth 2 (two) times daily. Patient not taking: Reported on 07/19/2019 11/11/15 07/19/19  Britt Bottom, MD    Allergies    Ace inhibitors, Amoxicillin, and Lisinopril  Review of Systems   Review of Systems  All other systems reviewed and are negative.  Physical Exam Updated Vital Signs BP (!) 101/51 (BP Location: Left Arm)   Pulse (!) 102   Temp (!) 97.5 F (36.4 C) (Tympanic)   Resp 20   LMP 12/04/2020 (Approximate)   SpO2 100%   Physical Exam Vitals and nursing note reviewed. Exam conducted with a chaperone present.  Constitutional:      Appearance: Normal appearance.  HENT:     Head: Normocephalic and atraumatic.  Eyes:     General:        Right eye: No discharge.        Left eye: No discharge.     Conjunctiva/sclera: Conjunctivae normal.  Pulmonary:     Effort: Pulmonary effort is normal.  Genitourinary:    Comments: There are approximately 5 ulcers to the inner buttock and gluteal cleft.  The largest one is at the gluteal cleft which is about 8 cm in diameter.  This looks like a stage II ulcer.  The other are approximately 2 to 4 cm.  The large ulcer has white/greenish discharge and surrounding erythema.  Rectum is normal.  No obvious external hemorrhoids or fissures.  No evidence of overlying abscess in the buttock. Skin:    General: Skin is warm and dry.     Findings: No rash.  Neurological:     General: No focal deficit present.     Mental Status: She is alert.  Psychiatric:        Mood and Affect: Mood normal.        Behavior:  Behavior normal.    ED Results / Procedures / Treatments   Labs (all labs ordered are listed, but only abnormal results are displayed) Labs Reviewed - No data to display  EKG None  Radiology No results found.  Procedures Procedures   Medications Ordered in ED Medications  oxyCODONE-acetaminophen (PERCOCET/ROXICET) 5-325 MG per tablet 1 tablet (1 tablet Oral Given 12/26/20 1944)    ED Course  I have reviewed the triage vital signs and the nursing notes.  Pertinent labs & imaging results that were available during my care of the patient were reviewed by me and considered in my medical decision making (see chart for details).  Clinical Course as of 12/26/20 1953  Sat Dec 26, 2020  1953 I discussed this case with my attending physician who cosigned this note including patient's presenting symptoms, physical exam, and planned diagnostics and interventions. Attending physician stated agreement with plan or made changes to plan which were implemented.      [CF]    Clinical Course User Index [CF] Cherrie Gauze   MDM Rules/Calculators/A&P                          Dawn Peters is a 40 y.o. female who presents to the emergency department for evaluation of buttock wounds.  There is somewhat concerned that these ulcers are infected.  Her vital signs are otherwise normal and she does not have any systemic signs of infection.  Have a low suspicion for sepsis at this time.  Given that these are likely infected I will place her on doxycycline and have her follow-up quickly with her primary care provider.  Strict return precautions are given.  Her pain was adequately controlled with Percocet.  She is safe for discharge.   Final Clinical Impression(s) / ED Diagnoses Final diagnoses:  Skin ulcer of buttock, unspecified ulcer stage ([place])    Rx / DC Orders ED Discharge Orders          Ordered    doxycycline (VIBRAMYCIN) 100 MG capsule  2 times daily        12/26/20  1947             Hendricks Limes, PA-C 12/26/20 1952    Hendricks Limes, PA-C 12/26/20 1953    Truddie Hidden, MD 12/26/20 2256

## 2020-12-26 NOTE — ED Triage Notes (Signed)
Pt c/o abcess to area of coccyx, above anus for 2 weeks.  Pt has tried hydrocortisone per PMD with no relief.

## 2020-12-26 NOTE — Discharge Instructions (Addendum)
You were seen and evaluated in the emergency department today for evaluation of back pain as we discussed, it appears you have approximately 5 ulcers lining the inner buttocks.  The one towards the top of your buttocks appears infected.  I will place you on doxycycline which is an antibiotic.  Please take Tylenol scheduled for pain.  I would like for you to follow-up with your primary care doctor next week to ensure we are going the right direction.  Please turn to the emergency department if you experience worsening pain, increased drainage, fevers, chills, or any other concerns you might have.

## 2020-12-26 NOTE — ED Notes (Signed)
Pt NAD, a/ox4. Pt verbalizes understanding of all DC and f/u instructions. All questions answered. Pt assisted into w/c by ED tech and wheeled to lobby.

## 2020-12-26 NOTE — ED Notes (Signed)
Non adherent dressings placed in between ulcers and taped to cheeks. Pt instructed on changing of dressings.

## 2020-12-26 NOTE — ED Notes (Signed)
Pt lying semi fowlers in bed, NAD. A/ox4, states she has abscess around coccyx x 2 weeks. Pt denies fevers, drainage

## 2020-12-30 ENCOUNTER — Ambulatory Visit: Payer: Medicare Other | Admitting: Physical Therapy

## 2020-12-30 ENCOUNTER — Telehealth: Payer: Self-pay | Admitting: Physical Therapy

## 2020-12-30 ENCOUNTER — Ambulatory Visit: Payer: Medicare Other | Admitting: Occupational Therapy

## 2020-12-30 NOTE — Telephone Encounter (Signed)
No-show for appt 10/26- called and spoke to her, she is actually in the hospital. Will plan to call us back with how she is feeling later this week/let us know if she can make her Friday appointments.  Windell Norfolk, DPT, PN2   Supplemental Physical Therapist [place]    Pager [phone] Acute Rehab Office [phone]

## 2020-12-31 ENCOUNTER — Telehealth: Payer: Self-pay

## 2020-12-31 NOTE — Telephone Encounter (Signed)
Pt called stating she is in a [facility] hospital and wants [facility] to coordinate sending blood to her since her count is low. She states she has been in there for a couple days and [facility] doesn't take this long to locate blood for her. I advised the pt blood coordination has to go through appropriate channels. Pt was encouraged to follow-up with her providers there for status updates.

## 2021-01-01 ENCOUNTER — Inpatient Hospital Stay: Payer: Medicare Other

## 2021-01-01 ENCOUNTER — Ambulatory Visit: Payer: Medicare Other | Admitting: Physical Therapy

## 2021-01-01 ENCOUNTER — Other Ambulatory Visit: Payer: Self-pay | Admitting: *Deleted

## 2021-01-01 ENCOUNTER — Ambulatory Visit: Payer: Medicare Other | Admitting: Occupational Therapy

## 2021-01-01 VITALS — BP 120/78 | HR 92 | Temp 97.9°F | Resp 18

## 2021-01-01 DIAGNOSIS — N183 Chronic kidney disease, stage 3 unspecified: Secondary | ICD-10-CM

## 2021-01-01 DIAGNOSIS — D649 Anemia, unspecified: Secondary | ICD-10-CM

## 2021-01-01 DIAGNOSIS — R2689 Other abnormalities of gait and mobility: Secondary | ICD-10-CM | POA: Diagnosis not present

## 2021-01-01 LAB — CBC WITH DIFFERENTIAL (CANCER CENTER ONLY)
Abs Immature Granulocytes: 0.18 10*3/uL — ABNORMAL HIGH (ref 0.00–0.07)
Basophils Absolute: 0 10*3/uL (ref 0.0–0.1)
Basophils Relative: 0 %
Eosinophils Absolute: 0.1 10*3/uL (ref 0.0–0.5)
Eosinophils Relative: 1 %
HCT: 18.9 % — ABNORMAL LOW (ref 36.0–46.0)
Hemoglobin: 6.3 g/dL — CL (ref 12.0–15.0)
Immature Granulocytes: 5 %
Lymphocytes Relative: 3 %
Lymphs Abs: 0.1 10*3/uL — ABNORMAL LOW (ref 0.7–4.0)
MCH: 30.1 pg (ref 26.0–34.0)
MCHC: 33.3 g/dL (ref 30.0–36.0)
MCV: 90.4 fL (ref 80.0–100.0)
Monocytes Absolute: 0.7 10*3/uL (ref 0.1–1.0)
Monocytes Relative: 17 %
Neutro Abs: 2.9 10*3/uL (ref 1.7–7.7)
Neutrophils Relative %: 74 %
Platelet Count: 322 10*3/uL (ref 150–400)
RBC: 2.09 MIL/uL — ABNORMAL LOW (ref 3.87–5.11)
RDW: 13.4 % (ref 11.5–15.5)
WBC Count: 3.9 10*3/uL — ABNORMAL LOW (ref 4.0–10.5)
nRBC: 0 % (ref 0.0–0.2)

## 2021-01-01 LAB — PREPARE RBC (CROSSMATCH)

## 2021-01-01 LAB — SAMPLE TO BLOOD BANK

## 2021-01-01 MED ORDER — EPOETIN ALFA-EPBX 10000 UNIT/ML IJ SOLN
10000.0000 [IU] | Freq: Once | INTRAMUSCULAR | Status: AC
  Administered 2021-01-01: 10000 [IU] via SUBCUTANEOUS
  Filled 2021-01-01: qty 1

## 2021-01-01 NOTE — Progress Notes (Signed)
CRITICAL VALUE STICKER  CRITICAL VALUE: Hgb 6.3  DATE & TIME NOTIFIED: 01/01/2021, 16:38  MD NOTIFIED: Dr. Alen Blew  TIME OF NOTIFICATION: MD already aware of critical value

## 2021-01-01 NOTE — Patient Instructions (Signed)
Epoetin Alfa injection What is this medication? EPOETIN ALFA (e POE e tin AL fa) helps your body make more red blood cells. This medicine is used to treat anemia caused by chronic kidney disease, cancer chemotherapy, or HIV-therapy. It may also be used before surgery if you have anemia. This medicine may be used for other purposes; ask your health care provider or pharmacist if you have questions. COMMON BRAND NAME(S): Epogen, Procrit, Retacrit What should I tell my care team before I take this medication? They need to know if you have any of these conditions: cancer heart disease high blood pressure history of blood clots history of stroke low levels of folate, iron, or vitamin B12 in the blood seizures an unusual or allergic reaction to erythropoietin, albumin, benzyl alcohol, hamster proteins, other medicines, foods, dyes, or preservatives pregnant or trying to get pregnant breast-feeding How should I use this medication? This medicine is for injection into a vein or under the skin. It is usually given by a health care professional in a hospital or clinic setting. If you get this medicine at home, you will be taught how to prepare and give this medicine. Use exactly as directed. Take your medicine at regular intervals. Do not take your medicine more often than directed. It is important that you put your used needles and syringes in a special sharps container. Do not put them in a trash can. If you do not have a sharps container, call your pharmacist or healthcare provider to get one. A special MedGuide will be given to you by the pharmacist with each prescription and refill. Be sure to read this information carefully each time. Talk to your pediatrician regarding the use of this medicine in children. While this drug may be prescribed for selected conditions, precautions do apply. Overdosage: If you think you have taken too much of this medicine contact a poison control center or emergency  room at once. NOTE: This medicine is only for you. Do not share this medicine with others. What if I miss a dose? If you miss a dose, take it as soon as you can. If it is almost time for your next dose, take only that dose. Do not take double or extra doses. What may interact with this medication? Interactions have not been studied. This list may not describe all possible interactions. Give your health care provider a list of all the medicines, herbs, non-prescription drugs, or dietary supplements you use. Also tell them if you smoke, drink alcohol, or use illegal drugs. Some items may interact with your medicine. What should I watch for while using this medication? Your condition will be monitored carefully while you are receiving this medicine. You may need blood work done while you are taking this medicine. This medicine may cause a decrease in vitamin B6. You should make sure that you get enough vitamin B6 while you are taking this medicine. Discuss the foods you eat and the vitamins you take with your health care professional. What side effects may I notice from receiving this medication? Side effects that you should report to your doctor or health care professional as soon as possible: allergic reactions like skin rash, itching or hives, swelling of the face, lips, or tongue seizures signs and symptoms of a blood clot such as breathing problems; changes in vision; chest pain; severe, sudden headache; pain, swelling, warmth in the leg; trouble speaking; sudden numbness or weakness of the face, arm or leg signs and symptoms of a stroke like   changes in vision; confusion; trouble speaking or understanding; severe headaches; sudden numbness or weakness of the face, arm or leg; trouble walking; dizziness; loss of balance or coordination Side effects that usually do not require medical attention (report to your doctor or health care professional if they continue or are  bothersome): chills cough dizziness fever headaches joint pain muscle cramps muscle pain nausea, vomiting pain, redness, or irritation at site where injected This list may not describe all possible side effects. Call your doctor for medical advice about side effects. You may report side effects to FDA at 1-800-FDA-1088. Where should I keep my medication? Keep out of the reach of children. Store in a refrigerator between 2 and 8 degrees C (36 and 46 degrees F). Do not freeze or shake. Throw away any unused portion if using a single-dose vial. Multi-dose vials can be kept in the refrigerator for up to 21 days after the initial dose. Throw away unused medicine. NOTE: This sheet is a summary. It may not cover all possible information. If you have questions about this medicine, talk to your doctor, pharmacist, or health care provider.  2022 Elsevier/Gold Standard (2016-09-30 08:35:19)  

## 2021-01-04 ENCOUNTER — Ambulatory Visit: Payer: Medicare Other | Admitting: Physical Therapy

## 2021-01-04 ENCOUNTER — Other Ambulatory Visit: Payer: Self-pay

## 2021-01-04 ENCOUNTER — Inpatient Hospital Stay: Payer: Medicare Other

## 2021-01-04 ENCOUNTER — Ambulatory Visit: Payer: Medicare Other | Admitting: Occupational Therapy

## 2021-01-04 DIAGNOSIS — D649 Anemia, unspecified: Secondary | ICD-10-CM

## 2021-01-04 DIAGNOSIS — R2689 Other abnormalities of gait and mobility: Secondary | ICD-10-CM | POA: Diagnosis not present

## 2021-01-04 MED ORDER — SODIUM CHLORIDE 0.9% IV SOLUTION
250.0000 mL | Freq: Once | INTRAVENOUS | Status: AC
  Administered 2021-01-04: 250 mL via INTRAVENOUS

## 2021-01-04 MED ORDER — ACETAMINOPHEN 325 MG PO TABS
650.0000 mg | ORAL_TABLET | Freq: Once | ORAL | Status: AC
  Administered 2021-01-04: 650 mg via ORAL
  Filled 2021-01-04: qty 2

## 2021-01-04 MED ORDER — DIPHENHYDRAMINE HCL 25 MG PO CAPS
25.0000 mg | ORAL_CAPSULE | Freq: Once | ORAL | Status: AC
  Administered 2021-01-04: 25 mg via ORAL
  Filled 2021-01-04: qty 1

## 2021-01-04 NOTE — Patient Instructions (Signed)

## 2021-01-05 LAB — TYPE AND SCREEN
ABO/RH(D): B POS
Antibody Screen: POSITIVE
DAT, IgG: POSITIVE
Donor AG Type: NEGATIVE
Donor AG Type: NEGATIVE
Unit division: 0
Unit division: 0

## 2021-01-05 LAB — BPAM RBC
Blood Product Expiration Date: 202211252359
Blood Product Expiration Date: 202211252359
ISSUE DATE / TIME: 202210311358
ISSUE DATE / TIME: 202210311358
Unit Type and Rh: 7300
Unit Type and Rh: 7300

## 2021-01-06 ENCOUNTER — Ambulatory Visit: Payer: Medicare Other | Admitting: Occupational Therapy

## 2021-01-06 ENCOUNTER — Ambulatory Visit: Payer: Medicare Other | Attending: Internal Medicine | Admitting: Physical Therapy

## 2021-01-11 ENCOUNTER — Ambulatory Visit: Payer: Medicare Other | Admitting: Occupational Therapy

## 2021-01-11 ENCOUNTER — Ambulatory Visit: Payer: Medicare Other | Admitting: Physical Therapy

## 2021-01-13 ENCOUNTER — Ambulatory Visit: Payer: Medicare Other | Admitting: Occupational Therapy

## 2021-01-13 ENCOUNTER — Ambulatory Visit: Payer: Medicare Other | Admitting: Physical Therapy

## 2021-01-18 ENCOUNTER — Ambulatory Visit: Payer: Medicare Other | Admitting: Occupational Therapy

## 2021-01-18 ENCOUNTER — Other Ambulatory Visit: Payer: Self-pay

## 2021-01-18 ENCOUNTER — Ambulatory Visit: Payer: Medicare Other | Admitting: Physical Therapy

## 2021-01-18 ENCOUNTER — Ambulatory Visit (INDEPENDENT_AMBULATORY_CARE_PROVIDER_SITE_OTHER): Payer: Medicare Other | Admitting: Neurology

## 2021-01-18 ENCOUNTER — Encounter: Payer: Self-pay | Admitting: Neurology

## 2021-01-18 VITALS — BP 104/70 | HR 108 | Ht 66.0 in | Wt 202.0 lb

## 2021-01-18 DIAGNOSIS — R5383 Other fatigue: Secondary | ICD-10-CM

## 2021-01-18 DIAGNOSIS — D649 Anemia, unspecified: Secondary | ICD-10-CM

## 2021-01-18 DIAGNOSIS — M5416 Radiculopathy, lumbar region: Secondary | ICD-10-CM | POA: Diagnosis not present

## 2021-01-18 DIAGNOSIS — G35 Multiple sclerosis: Secondary | ICD-10-CM | POA: Diagnosis not present

## 2021-01-18 DIAGNOSIS — M21372 Foot drop, left foot: Secondary | ICD-10-CM | POA: Diagnosis not present

## 2021-01-18 DIAGNOSIS — G8929 Other chronic pain: Secondary | ICD-10-CM

## 2021-01-18 DIAGNOSIS — M5442 Lumbago with sciatica, left side: Secondary | ICD-10-CM

## 2021-01-18 DIAGNOSIS — R269 Unspecified abnormalities of gait and mobility: Secondary | ICD-10-CM

## 2021-01-18 DIAGNOSIS — N399 Disorder of urinary system, unspecified: Secondary | ICD-10-CM

## 2021-01-18 NOTE — Progress Notes (Signed)
[facility]  PATIENT: Dawn Peters DOB: 12/12/1980  REFERRING DOCTOR OR PCP:  Dr. Annitta Needs SOURCE: Patient, records in EMR, lab results and radiology reports in EMR, MRI images reviewed on PACS.  _________________________________   HISTORICAL  CHIEF COMPLAINT:  Chief Complaint  Patient presents with   Follow-up    Rm 16, alone. When walking back to room, pt reported feeling dizzy, pt was given a WC to help her come back to room. Pt believes her blood sugar is low. Pt would like to discuss other options for her MS medication.     HISTORY OF PRESENT ILLNESS:  Dawn Peters is a 40 y.o. woman who was diagnosed with relapsing remitting MS in 2000.     Update 01/18/2021: She is tolerating Zeposia well and has no exacerbation.    She was on [place] and still doing monthly REMS labs.  In between she was also on Ocrevus.    Her lymphocyte count has been 0.1 the last 2 month (was 0.2 and 0.3 earlier in year -- Zeposia typically has 50-70% reduction in lymphocyte count so 0.3-0.6 is common).  We discussed going to every other day Zeposia.    She has had anemia and required transfusions twice in the last 6 weeks or so.     She was found to have normocytic, normochromic and hypoproliferative component anemia.   It did not appear to be autoimmune (has been on Lemtrada in past).  She has left leg weakness and spasticity and uses a cane.  She is able to walk about 100 feet with her cane, about the same as earlier this year.  She will use a chair for longer distance.  She has had progression with more left leg weakness the past few years but is stable compared to her last visit.  She has lost some weight.  Dysesthesias are better on  gabapentin.      She has urinary urgency and hesitancy.  She is on tamsulosin.   She sees urology.  She has increased creatinine and sees Nephrologist is at [facility] and reports stable kidney function.     Vision is stable.    Her  fatigue is helped by Adderall.  She sleeps well most nights.  Depression and anxiety are better with the citalopram and anxiety is better with BuSpar .   She is taking vitamin D for her deficiency.  She has 2-3 migraines a month, helped by sunatritan.   She continues to report back pain and left leg pain that radiates.  She was seeing  [facility] annd would like a referral to a differntn provider.     MS history:   She was diagnosed with MS in 2000 after presenting with gait difficulties, numbness and headaches. An MRI of the brain was consistent with MS. She was then started on Betaseron. She had difficulty tolerating Betaseron and at some point switched to Novantrone. She took Novantrone every 3 months for a year or so. A little later, she was placed on Tysabri and she stayed on it for a couple of years. However, she was JCV-positive and switched off. She did well on Tysabri. She had been on Tecfidera since 2015. Her MRI from June 2015 showed that there had been no changes compared to an MRI from 2014.   However, she had an exacerbation March 2017.   The 08/07/2013 MRI of the brain shows multiple T2/FLAIR hyperintense foci located in the  cerebellum, middle cerebellar peduncles, pons and in the periventricular white matter of the hemispheres in a pattern and configuration consistent with chronic demyelinating plaque associated with MS. There were no acute findings. There was no change compared to the previous MRI from 01/16/2013 that was also reviewed. She switched to [place] in July,2017/Aug 2018.   Due to progression she was shortly on Ocrevus in 2020 and then switched to Zeposia January 2021.    IMAGING:  MRI cervical spine 08/31/2020 showed ubtle T2 hyperintense signal within the cord and there were no acute findings.  MRI brain 08/31/2020 showed Unchanged distribution of white matter lesions in a pattern compatible with multiple sclerosis. No active  demyelinating lesions.  REVIEW OF SYSTEMS: Constitutional: No fevers, chills, sweats, or change in appetite.   She has fatigue.  Some insomnia. Eyes: No visual changes, double vision, eye pain Ear, nose and throat: No hearing loss, ear pain, nasal congestion, sore throat Cardiovascular: No chest pain, palpitations Respiratory:  No shortness of breath at rest or with exertion.   No wheezes GastrointestinaI: No nausea, vomiting, diarrhea, abdominal pain, fecal incontinence Genitourinary:  No dysuria.   She has hesitancy. Sometimes she has difficulty emptying completely Flomax has helped urinary hesitancy. 1 x nocturia. Musculoskeletal: She notes pain in the left leg and some back pain Integumentary: No rash, pruritus, skin lesions Neurological: as above Psychiatric: Mild depression at this time.  Moderate anxiety Endocrine: No palpitations, diaphoresis, change in appetite, change in weigh or increased thirst Hematologic/Lymphatic:  No anemia, purpura, petechiae. Allergic/Immunologic: No itchy/runny eyes, nasal congestion, recent allergic reactions, rashes  ALLERGIES: Allergies  Allergen Reactions   Ace Inhibitors Swelling   Amoxicillin Itching    Did it involve swelling of the face/tongue/throat, SOB, or low BP? Yes Did it involve sudden or severe rash/hives, skin peeling, or any reaction on the inside of your mouth or nose? No Did you need to seek medical attention at a hospital or doctor's office? No When did it last happen?      unknown  If all above answers are "NO", may proceed with cephalosporin use.;   Lisinopril Swelling    Lower lip swelling    HOME MEDICATIONS:  Current Outpatient Medications:    acetaminophen (TYLENOL) 500 MG tablet, Take 1,000 mg by mouth every 6 (six) hours as needed for moderate pain or headache., Disp: , Rfl:    amantadine (SYMMETREL) 100 MG capsule, Take 100 mg by mouth 2 (two) times daily., Disp: , Rfl:    amitriptyline (ELAVIL) 25 MG tablet, TAKE  1 TO 2 TABLETS(25 TO 50 MG) BY MOUTH AT BEDTIME (Patient taking differently: Take 25-50 mg by mouth at bedtime.), Disp: 180 tablet, Rfl: 1   amLODipine (NORVASC) 5 MG tablet, Take 1 tablet (5 mg total) by mouth daily. (Patient taking differently: Take 5 mg by mouth daily after breakfast.), Disp: 90 tablet, Rfl: 0   amphetamine-dextroamphetamine (ADDERALL XR) 20 MG 24 hr capsule, Take 1 capsule (20 mg total) by mouth daily., Disp: 30 capsule, Rfl: 0   apixaban (ELIQUIS) 5 MG TABS tablet, Take 1 tablet (5 mg total) by mouth 2 (two) times daily., Disp: 60 tablet, Rfl: 5   baclofen (LIORESAL) 20 MG tablet, Take 1 tablet (20 mg total) by mouth 4 (four) times daily as needed for muscle spasms., Disp: 360 tablet, Rfl: 3   busPIRone (BUSPAR) 15 MG tablet, Take 1 tablet (15 mg total) by mouth 2 (two) times daily. Must keep follow up 09/21/20 for ongoing refills, Disp:  60 tablet, Rfl: 1   citalopram (CELEXA) 20 MG tablet, Take 1 tablet (20 mg total) by mouth daily., Disp: 30 tablet, Rfl: 11   dalfampridine 10 MG TB12, Take 1 tablet (10 mg total) by mouth 2 (two) times daily., Disp: 60 tablet, Rfl: 11   doxycycline (VIBRAMYCIN) 100 MG capsule, Take 1 capsule (100 mg total) by mouth 2 (two) times daily., Disp: 20 capsule, Rfl: 0   gabapentin (NEURONTIN) 800 MG tablet, Take 1 tablet (800 mg total) by mouth 4 (four) times daily., Disp: 120 tablet, Rfl: 11   metoprolol succinate (TOPROL-XL) 50 MG 24 hr tablet, Take 50 mg by mouth daily., Disp: , Rfl:    Multiple Vitamin (MULTIVITAMIN WITH MINERALS) TABS tablet, Take 1 tablet by mouth daily., Disp: 30 tablet, Rfl: 0   norethindrone (MICRONOR) 0.35 MG tablet, Take 1 tablet (0.35 mg total) by mouth daily., Disp: 1 Package, Rfl: 11   omeprazole (PRILOSEC) 40 MG capsule, Take 40 mg by mouth daily., Disp: , Rfl:    oxyCODONE-acetaminophen (PERCOCET) 10-325 MG tablet, Take 1 tablet by mouth every 4 (four) hours., Disp: , Rfl:    Ozanimod HCl (ZEPOSIA) 0.92 MG CAPS, TAKE  ONE CAPSULE (0.92MG )  BY MOUTH ONE TIME DAILY (Patient taking differently: Take 0.92 mg by mouth daily.), Disp: 30 capsule, Rfl: 11   sucralfate (CARAFATE) 1 g tablet, Take 1 tablet (1 g total) by mouth 4 (four) times daily -  with meals and at bedtime., Disp: 40 tablet, Rfl: 0   SUMAtriptan (IMITREX) 100 MG tablet, TAKE 1 TABLET BY MOUTH 1 TIME FOR UP TO 1 DOSE AS NEEDED FOR MIGRAINE. MAY REPEAT IN 2 HOURS IF HEADACHE PERSISTS OR RECURS (Patient taking differently: Take 100 mg by mouth daily as needed for migraine.), Disp: 10 tablet, Rfl: 5   tamsulosin (FLOMAX) 0.4 MG CAPS capsule, TAKE 1 CAPSULE(0.4 MG) BY MOUTH DAILY (Patient taking differently: Take 0.4 mg by mouth daily.), Disp: 30 capsule, Rfl: 11   tiZANidine (ZANAFLEX) 4 MG tablet, TAKE 1 TABLET BY MOUTH EVERY NIGHT AT BEDTIME (Patient taking differently: Take 4 mg by mouth at bedtime.), Disp: 30 tablet, Rfl: 10 No current facility-administered medications for this visit.  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   left lower extremity-- treated with eliquis   Gait disturbance    due to MS   GERD (gastroesophageal reflux disease)    watch diet   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/p  THA   History of DVT of lower extremity 2007   History of encephalopathy 85/2778   acute metabolic encephalopathy secondary to MS,  resolved   History of MRSA infection 2004   right hip infection post THA   History of pulmonary embolus (PE) 2005   Hydronephrosis, left    w/ acute kidney injury 02/ 2019   Hypertension     Left-sided weakness    due to MS   Migraines    MS (multiple sclerosis) [facility]) neurologist-  dr Renne Musca   dx 2000--  Muscle spasticity    Neurogenic bladder    due to MS   Pulmonary embolism, bilateral ([place]) 09/17/2017   treated w/ eliquis   Strains to urinate    Urgency of urination     PAST SURGICAL HISTORY: Past Surgical History:  Procedure Laterality Date   CYSTOSCOPY WITH RETROGRADE PYELOGRAM, URETEROSCOPY AND STENT PLACEMENT Bilateral 11/29/2017   Procedure: BILATERAL  RETROGRADE PYELOGRAM, LEFT DIAGNOSIC URETEROSCOPY AND STENT LEFT PLACEMENT;  Surgeon: Ardis Hughs, MD;  Location: [facility];  Service: Urology;  Laterality: Bilateral;   HIP ARTHROSCOPY Left 07-05-2013   dr pill @[facility]    iliopsosas release, synovectomy   REVISION TOTAL HIP ARTHROPLASTY Right 03-28-2003    dr Alvan Dame @[facility]    TOTAL HIP ARTHROPLASTY Bilateral left 11-05-2002  dr Alvan Dame @[facility] ;   right 06/ 2004  @[facility]     FAMILY HISTORY: Family History  Problem Relation Age of Onset   Healthy Mother    Healthy Father    Breast cancer Other        paternal grandmother dx age 32   Colon cancer Other        paternal grandmother   Hypertension Other    Diabetes Other     SOCIAL HISTORY:  Social History   Socioeconomic History   Marital status: Single    Spouse name: Not on file   Number of children: Not on file   Years of education: Not on file   Highest education level: Not on file  Occupational History   Not on file  Tobacco Use   Smoking status: Never   Smokeless tobacco: Never  Vaping Use   Vaping Use: Never used  Substance and Sexual Activity   Alcohol use: No   Drug use: No   Sexual activity: Not Currently  Other Topics Concern   Not on file  Social History Narrative   Not on file   Social Determinants of Health   Financial Resource Strain: Not on file  Food Insecurity: Not on file  Transportation Needs: Not on file  Physical Activity: Not on file  Stress: Not on  file  Social Connections: Not on file  Intimate Partner Violence: Not on file     PHYSICAL EXAM  Vitals:   01/18/21 1429  BP: 104/70  Pulse: (!) 108  Weight: 202 lb (91.6 kg)  Height: 5\' 6"  (1.676 m)    Body mass index is 32.6 kg/m.   General: The patient is well-developed and well-nourished and in no acute distress.  Neurologic Exam  Mental status: The patient is alert and oriented x 3 at the time of the examination. The patient has apparent normal recent and remote memory, with mildly reduced attention span and concentration ability.   Speech is normal.  Cranial nerves: Extraocular movements are full.  Facial strength and sensation is normal.  Trapezius strength is normal.. No obvious hearing deficits are noted.  Motor:  Muscle bulk is normal.  She has increased muscle tone in the left leg.  Strength is normal in the arms and right leg and 3/5 left hip flexion.,  4-/5 in the distal left leg and 4+/5 quads  Sensory: Touch and vibration sensation is normal in the arms and legs.  Coordination: She performs finger-nose-finger well.  She has reduced heel-to-shin, left worse than right.     Gait and station:She is able to walk without unilateral support but does better with her cane.  She has left foot drop.   She cannot tandem walk.  Reflexes: Deep tendon reflexes are increased in the legs, left greater than right.  She has crossed abductor responses, worse on the left.  She has nonsustained clonus in the left ankle.    DIAGNOSTIC DATA (LABS, IMAGING, TESTING) - I reviewed patient records, labs, notes, testing and imaging myself where available.  Lab Results  Component Value Date   WBC 3.9 (L) 01/01/2021   HGB 6.3 (LL) 01/01/2021   HCT 18.9 (L) 01/01/2021   MCV 90.4 01/01/2021   PLT 322 01/01/2021      Component Value Date/Time   NA 138 11/26/2020 0021   NA 144 06/21/2017 1626   K 3.6 11/26/2020 0021   CL 105 11/26/2020 0021   CO2 25 11/26/2020 0021   GLUCOSE  151 (H) 11/26/2020 0021   BUN 32 (H) 11/26/2020 0021   BUN 16 06/21/2017 1626   CREATININE 1.58 (H) 11/26/2020 0021   CREATININE 0.85 07/03/2014 0959   CALCIUM 9.0 11/26/2020 0021   PROT 5.6 (L) 11/23/2020 0218   PROT 7.5 01/11/2018 1542   ALBUMIN 3.1 (L) 11/23/2020 0218   ALBUMIN 4.2 01/11/2018 1542   AST 19 11/23/2020 0218   ALT 18 11/23/2020 0218   ALKPHOS 104 11/23/2020 0218   BILITOT 0.6 11/23/2020 0218   BILITOT 0.5 01/11/2018 1542   GFRNONAA 42 (L) 11/26/2020 0021   GFRNONAA 88 07/03/2014 0959   GFRAA 45 (L) 09/01/2019 0503   GFRAA >89 07/03/2014 0959   Lab Results  Component Value Date   CHOL 228 (H) 04/20/2017   HDL 77 04/20/2017   LDLCALC 142 (H) 04/20/2017   TRIG 46 04/20/2017   CHOLHDL 3.0 04/20/2017   Lab Results  Component Value Date   HGBA1C 5.3 04/20/2017   Lab Results  Component Value Date   VITAMINB12 213 11/21/2020   Lab Results  Component Value Date   TSH 1.140 02/19/2019      ASSESSMENT AND PLAN    1. Multiple sclerosis ([place])   2. Left foot drop   3. Lumbar radiculopathy   4. Chronic midline low back pain with left-sided sciatica   5. Other fatigue   6. Urinary disorder   7. Gait disturbance   8. Anemia, unspecified type        1.   continue Zeposia but reduce dose to every other day as her lymphocytes are < 0.2 (lymphocyte counts have been 0.1 on multiple cbc/diff overt last 2 months).   2.   For migraine can take smatriptan 3.    Renew Adderall. 4.   Due to LBP and radiculopaathy, we will check a lumbar MRI and refer to Pain management.   5.    She will return to see me in 4 months or sooner if there are new or worsening neurologic symptoms.   Cayleb Jarnigan A. Felecia Shelling, MD, PhD 95/18/8416, 6:06 PM Certified in Neurology, Clinical Neurophysiology, Sleep Medicine, Pain Medicine and Neuroimaging  [facility] [address] [place], [place] [address] [phone] ]

## 2021-01-18 NOTE — Progress Notes (Signed)
Faxed order for AFO to [employer] at [phone]. Received fax confirmation.

## 2021-01-19 ENCOUNTER — Telehealth: Payer: Self-pay | Admitting: Neurology

## 2021-01-19 NOTE — Telephone Encounter (Signed)
[employer] medicare/medicaid order sent to [facility], [facility] they will reach out to the patient to schedule.

## 2021-01-21 ENCOUNTER — Ambulatory Visit: Payer: Medicare Other | Admitting: Physical Therapy

## 2021-01-21 ENCOUNTER — Ambulatory Visit: Payer: Medicare Other | Admitting: Occupational Therapy

## 2021-01-23 ENCOUNTER — Other Ambulatory Visit: Payer: Self-pay

## 2021-01-23 ENCOUNTER — Ambulatory Visit
Admission: RE | Admit: 2021-01-23 | Discharge: 2021-01-23 | Disposition: A | Discharge status: Home / Self Care | Payer: Medicare Other | Source: Ambulatory Visit | Attending: Neurology | Admitting: Neurology

## 2021-01-23 DIAGNOSIS — M21372 Foot drop, left foot: Secondary | ICD-10-CM | POA: Diagnosis not present

## 2021-01-23 DIAGNOSIS — M5416 Radiculopathy, lumbar region: Secondary | ICD-10-CM | POA: Diagnosis not present

## 2021-01-25 ENCOUNTER — Ambulatory Visit: Payer: Medicare Other | Admitting: Occupational Therapy

## 2021-01-25 ENCOUNTER — Ambulatory Visit: Payer: Medicare Other | Admitting: Physical Therapy

## 2021-01-27 ENCOUNTER — Ambulatory Visit: Payer: Medicare Other | Admitting: Physical Therapy

## 2021-01-27 ENCOUNTER — Ambulatory Visit: Payer: Medicare Other | Admitting: Occupational Therapy

## 2021-02-03 ENCOUNTER — Other Ambulatory Visit: Payer: Self-pay | Admitting: Neurology

## 2021-02-03 ENCOUNTER — Encounter: Payer: Self-pay | Admitting: Physical Medicine and Rehabilitation

## 2021-02-03 NOTE — Telephone Encounter (Signed)
Pt requesting refill for amphetamine-dextroamphetamine (ADDERALL XR) 20 MG 24 hr capsule & citalopram (CELEXA) 20 MG tablet. Pharmacy [facility] [phone] [place], [place] [address].

## 2021-02-04 MED ORDER — AMPHETAMINE-DEXTROAMPHET ER 20 MG PO CP24: 20.0000 mg | ORAL_CAPSULE | Freq: Every day | ORAL | 0 refills | Status: DC

## 2021-02-08 ENCOUNTER — Encounter (HOSPITAL_COMMUNITY): Payer: Self-pay

## 2021-02-08 ENCOUNTER — Telehealth: Payer: Self-pay | Admitting: *Deleted

## 2021-02-08 ENCOUNTER — Other Ambulatory Visit: Payer: Self-pay

## 2021-02-08 ENCOUNTER — Inpatient Hospital Stay (HOSPITAL_COMMUNITY)
Admission: EM | Admit: 2021-02-08 | Discharge: 2021-02-11 | DRG: 810 | Disposition: A | Discharge status: Home / Self Care | Payer: Medicare Other | Source: Ambulatory Visit | Attending: Internal Medicine | Admitting: Internal Medicine

## 2021-02-08 DIAGNOSIS — D619 Aplastic anemia, unspecified: Secondary | ICD-10-CM | POA: Diagnosis present

## 2021-02-08 DIAGNOSIS — N319 Neuromuscular dysfunction of bladder, unspecified: Secondary | ICD-10-CM | POA: Diagnosis present

## 2021-02-08 DIAGNOSIS — Z8614 Personal history of Methicillin resistant Staphylococcus aureus infection: Secondary | ICD-10-CM

## 2021-02-08 DIAGNOSIS — G35 Multiple sclerosis: Secondary | ICD-10-CM | POA: Diagnosis present

## 2021-02-08 DIAGNOSIS — N1832 Chronic kidney disease, stage 3b: Secondary | ICD-10-CM

## 2021-02-08 DIAGNOSIS — Z86718 Personal history of other venous thrombosis and embolism: Secondary | ICD-10-CM

## 2021-02-08 DIAGNOSIS — E869 Volume depletion, unspecified: Secondary | ICD-10-CM | POA: Diagnosis present

## 2021-02-08 DIAGNOSIS — D649 Anemia, unspecified: Secondary | ICD-10-CM | POA: Diagnosis present

## 2021-02-08 DIAGNOSIS — E669 Obesity, unspecified: Secondary | ICD-10-CM | POA: Diagnosis present

## 2021-02-08 DIAGNOSIS — G8929 Other chronic pain: Secondary | ICD-10-CM | POA: Diagnosis present

## 2021-02-08 DIAGNOSIS — Z79891 Long term (current) use of opiate analgesic: Secondary | ICD-10-CM

## 2021-02-08 DIAGNOSIS — Z20822 Contact with and (suspected) exposure to covid-19: Secondary | ICD-10-CM | POA: Diagnosis present

## 2021-02-08 DIAGNOSIS — Z8249 Family history of ischemic heart disease and other diseases of the circulatory system: Secondary | ICD-10-CM

## 2021-02-08 DIAGNOSIS — Z79899 Other long term (current) drug therapy: Secondary | ICD-10-CM | POA: Diagnosis not present

## 2021-02-08 DIAGNOSIS — Z86711 Personal history of pulmonary embolism: Secondary | ICD-10-CM | POA: Diagnosis not present

## 2021-02-08 DIAGNOSIS — K219 Gastro-esophageal reflux disease without esophagitis: Secondary | ICD-10-CM | POA: Diagnosis present

## 2021-02-08 DIAGNOSIS — Z6832 Body mass index (BMI) 32.0-32.9, adult: Secondary | ICD-10-CM

## 2021-02-08 DIAGNOSIS — Z96643 Presence of artificial hip joint, bilateral: Secondary | ICD-10-CM | POA: Diagnosis present

## 2021-02-08 DIAGNOSIS — G629 Polyneuropathy, unspecified: Secondary | ICD-10-CM | POA: Diagnosis present

## 2021-02-08 DIAGNOSIS — K449 Diaphragmatic hernia without obstruction or gangrene: Secondary | ICD-10-CM | POA: Diagnosis present

## 2021-02-08 DIAGNOSIS — F419 Anxiety disorder, unspecified: Secondary | ICD-10-CM | POA: Diagnosis present

## 2021-02-08 DIAGNOSIS — Z7901 Long term (current) use of anticoagulants: Secondary | ICD-10-CM

## 2021-02-08 DIAGNOSIS — F32A Depression, unspecified: Secondary | ICD-10-CM | POA: Diagnosis present

## 2021-02-08 DIAGNOSIS — Z888 Allergy status to other drugs, medicaments and biological substances status: Secondary | ICD-10-CM

## 2021-02-08 DIAGNOSIS — E538 Deficiency of other specified B group vitamins: Secondary | ICD-10-CM

## 2021-02-08 DIAGNOSIS — F418 Other specified anxiety disorders: Secondary | ICD-10-CM | POA: Diagnosis not present

## 2021-02-08 DIAGNOSIS — I10 Essential (primary) hypertension: Secondary | ICD-10-CM

## 2021-02-08 DIAGNOSIS — Z881 Allergy status to other antibiotic agents status: Secondary | ICD-10-CM

## 2021-02-08 DIAGNOSIS — D631 Anemia in chronic kidney disease: Secondary | ICD-10-CM | POA: Diagnosis present

## 2021-02-08 DIAGNOSIS — N183 Chronic kidney disease, stage 3 unspecified: Secondary | ICD-10-CM | POA: Diagnosis present

## 2021-02-08 DIAGNOSIS — Z960 Presence of urogenital implants: Secondary | ICD-10-CM | POA: Diagnosis present

## 2021-02-08 DIAGNOSIS — I129 Hypertensive chronic kidney disease with stage 1 through stage 4 chronic kidney disease, or unspecified chronic kidney disease: Secondary | ICD-10-CM | POA: Diagnosis present

## 2021-02-08 DIAGNOSIS — N1831 Chronic kidney disease, stage 3a: Secondary | ICD-10-CM | POA: Diagnosis present

## 2021-02-08 LAB — CBC WITH DIFFERENTIAL/PLATELET
Abs Immature Granulocytes: 0.04 10*3/uL (ref 0.00–0.07)
Basophils Absolute: 0 10*3/uL (ref 0.0–0.1)
Basophils Relative: 0 %
Eosinophils Absolute: 0 10*3/uL (ref 0.0–0.5)
Eosinophils Relative: 0 %
HCT: 17.2 % — ABNORMAL LOW (ref 36.0–46.0)
Hemoglobin: 5.7 g/dL — CL (ref 12.0–15.0)
Immature Granulocytes: 1 %
Lymphocytes Relative: 2 %
Lymphs Abs: 0.1 10*3/uL — ABNORMAL LOW (ref 0.7–4.0)
MCH: 29.2 pg (ref 26.0–34.0)
MCHC: 33.1 g/dL (ref 30.0–36.0)
MCV: 88.2 fL (ref 80.0–100.0)
Monocytes Absolute: 0.5 10*3/uL (ref 0.1–1.0)
Monocytes Relative: 15 %
Neutro Abs: 3 10*3/uL (ref 1.7–7.7)
Neutrophils Relative %: 82 %
Platelets: 218 10*3/uL (ref 150–400)
RBC: 1.95 MIL/uL — ABNORMAL LOW (ref 3.87–5.11)
RDW: 13.1 % (ref 11.5–15.5)
WBC: 3.7 10*3/uL — ABNORMAL LOW (ref 4.0–10.5)
nRBC: 0 % (ref 0.0–0.2)

## 2021-02-08 LAB — RETICULOCYTES
Immature Retic Fract: 3.2 % (ref 2.3–15.9)
RBC.: 2.33 MIL/uL — ABNORMAL LOW (ref 3.87–5.11)
Retic Ct Pct: <0.4 % — ABNORMAL LOW (ref 0.4–3.1)

## 2021-02-08 LAB — COMPREHENSIVE METABOLIC PANEL
ALT: 21 U/L (ref 0–44)
AST: 18 U/L (ref 15–41)
Albumin: 3.8 g/dL (ref 3.5–5.0)
Alkaline Phosphatase: 105 U/L (ref 38–126)
Anion gap: 9 (ref 5–15)
BUN: 22 mg/dL — ABNORMAL HIGH (ref 6–20)
CO2: 26 mmol/L (ref 22–32)
Calcium: 9 mg/dL (ref 8.9–10.3)
Chloride: 106 mmol/L (ref 98–111)
Creatinine, Ser: 1.44 mg/dL — ABNORMAL HIGH (ref 0.44–1.00)
GFR, Estimated: 47 mL/min — ABNORMAL LOW (ref >60)
Glucose, Bld: 111 mg/dL — ABNORMAL HIGH (ref 70–99)
Potassium: 3.8 mmol/L (ref 3.5–5.1)
Sodium: 141 mmol/L (ref 135–145)
Total Bilirubin: 0.6 mg/dL (ref 0.3–1.2)
Total Protein: 6.9 g/dL (ref 6.5–8.1)

## 2021-02-08 LAB — RESP PANEL BY RT-PCR (FLU A&B, COVID) ARPGX2
Influenza A by PCR: NEGATIVE
Influenza B by PCR: NEGATIVE
SARS Coronavirus 2 by RT PCR: NEGATIVE

## 2021-02-08 LAB — IRON AND TIBC
Iron: 158 ug/dL (ref 28–170)
Saturation Ratios: 91 % — ABNORMAL HIGH (ref 10.4–31.8)
TIBC: 173 ug/dL — ABNORMAL LOW (ref 250–450)
UIBC: 15 ug/dL

## 2021-02-08 LAB — FERRITIN: Ferritin: 903 ng/mL — ABNORMAL HIGH (ref 11–307)

## 2021-02-08 LAB — I-STAT BETA HCG BLOOD, ED (MC, WL, AP ONLY): I-stat hCG, quantitative: 8.4 m[IU]/mL — ABNORMAL HIGH (ref <5)

## 2021-02-08 LAB — PREPARE RBC (CROSSMATCH)

## 2021-02-08 LAB — PROTIME-INR
INR: 1 (ref 0.8–1.2)
Prothrombin Time: 13.6 seconds (ref 11.4–15.2)

## 2021-02-08 LAB — FOLATE: Folate: 5.3 ng/mL — ABNORMAL LOW (ref >5.9)

## 2021-02-08 LAB — VITAMIN B12: Vitamin B-12: 204 pg/mL (ref 180–914)

## 2021-02-08 MED ORDER — SODIUM CHLORIDE 0.9 % IV SOLN: 10.0000 mL/h | Freq: Once | INTRAVENOUS | Status: DC

## 2021-02-08 MED ORDER — ACETAMINOPHEN 325 MG PO TABS
650.0000 mg | ORAL_TABLET | Freq: Once | ORAL | Status: AC
  Administered 2021-02-08: 650 mg via ORAL
  Filled 2021-02-08: qty 2

## 2021-02-08 NOTE — Telephone Encounter (Signed)
Dawn Peters left a message stating her MD office called and said her Hgb is 5. RN attempted to call her back, but got VM. RN left a message stating that she needs to go to the ED now.   She reported earlier that her Hgb was 8.1 when she had her colonoscopy. (RN unsure of date)

## 2021-02-08 NOTE — ED Triage Notes (Signed)
Patient reports that she had her blood drawn yesterday and was called today for a Hgb-5.0. Patient states she has low iron. No visible bleeding noted. Patient states she went to her physician yesterday for dizziness.

## 2021-02-08 NOTE — ED Provider Notes (Signed)
Emergency Medicine Provider Triage Evaluation Note  Dawn Peters , a 40 y.o. female  was evaluated in triage.  Pt complains of feeling lightheaded and tired.  She went to [facility] where she reports that she was told her hemoglobin was 8.  She was sent for transfusion. She states she has had transfusions before, and has antibodies in her blood.  She states that she is willing to accept transfusion.  Review of Systems  Positive: Light headed, dizzy Negative: syncope  Physical Exam  BP 134/69 (BP Location: Left Arm)   Pulse (!) 106   Temp 98.5 F (36.9 C) (Oral)   Resp 16   Ht 5\' 6"  (1.676 m)   Wt 91.6 kg   LMP 02/03/2021 (Approximate)   SpO2 100%   BMI 32.60 kg/m  Gen:   Awake, no distress   Resp:  Normal effort  MSK:   Moves extremities without difficulty  Other:  Normal speech.  Slightly tachycardic.  Medical Decision Making  Medically screening exam initiated at 6:05 PM.  Appropriate orders placed.  Sharri Merideth Bosque was informed that the remainder of the evaluation will be completed by another provider, this initial triage assessment does not replace that evaluation, and the importance of remaining in the ED until their evaluation is complete.  Transfusion consent was signed by patient in the room. While I am unable to view the labs from outside [facility] we will recheck labs including CBC today.  Anemia panel is sent.  She has a history of antibodies.    Lorin Glass, PA-C 02/08/21 1807    Drenda Freeze, MD 02/08/21 [phone]

## 2021-02-08 NOTE — Telephone Encounter (Signed)
Dawn Peters says Hgb was 8.1 when she had recent colonoscopy. Is feeling light-headed and tired.

## 2021-02-08 NOTE — Telephone Encounter (Signed)
Dawn Peters left a message stating she had labs checked at [facility]. They told her that her iron and Hgb are low, needs transfusion.   RN LM for her to call back with lab results.   Message to scheduler to arrange lab and possible transfusion this week

## 2021-02-08 NOTE — ED Provider Notes (Signed)
[facility] DEPT Provider Note   CSN: [id number] Arrival date & time: 02/08/21  1735     History Chief Complaint  Patient presents with   Abnormal Lab    Dawn Peters is a 40 y.o. female.  HPI Patient is a 40 year old female with a history of CKD, PE/DVT on Eliquis, GERD, MS, who presents to the emergency department due to lightheadedness as well as fatigue.  Patient states that she has a history of anemia and has required transfusions in the past.  She states that she was most recently transfused about 2 weeks ago and required 2 units at that time.  She states that she has had both an endoscopy as well as a colonoscopy and has never received any diagnosis for her anemia.  She states that she recently became lightheaded once again so she went to [facility] yesterday and had her lab work obtained.  She was called today and told that her hemoglobin was 5 so she came to the emergency department.  Denies any chest pain, shortness of breath, abdominal pain, nausea, vomiting, diarrhea.  Patient denies any melena or hematochezia.  Patient last seen by gastroenterology on November 17.  Per records, patient has a history of normocytic, normochromic anemia and has required transfusions in the past.  Patient denies any history of overt GI bleeding pain.  Patient is followed by hematology as well and had a normal bone marrow biopsy recently.  Iron levels recently found to be significantly elevated with an iron saturation of 88% along with a ferritin greater than 1000.  Normal LFTs.  Upper GI series performed on October 28 showing no gross acute abnormalities but did show mild fold thickening in the jejunum with sliding hiatal hernia.  Patient was last transfused on October 31 and received 2 units of PRBCs.  She states that she had a colonoscopy on November 23 but I do not have access to these results.  She states that "they were normal".    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   left lower extremity-- treated with eliquis   Gait disturbance    due to MS   GERD (gastroesophageal reflux disease)    watch diet   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/p  THA   History of DVT of lower extremity 2007   History of encephalopathy 24/0973   acute metabolic encephalopathy secondary to MS,  resolved   History of MRSA infection 2004   right hip infection post THA   History of pulmonary embolus (PE) 2005   Hydronephrosis, left    w/ acute kidney injury 02/ 2019   Hypertension    Left-sided weakness    due to MS   Migraines    MS (multiple sclerosis) [facility]) neurologist-  dr Renne Musca   dx 2000--   Muscle spasticity    Neurogenic bladder    due to MS   Pulmonary embolism, bilateral ([place]) 09/17/2017   treated w/ eliquis   Strains to urinate    Urgency of urination    Patient Active Problem List   Diagnosis Date Noted   Symptomatic anemia 11/21/2020   Amenorrhea 11/20/2018   Well woman exam  11/20/2018   MS (multiple sclerosis) ([place]) 03/14/2018   Stage 3 chronic kidney disease (HCC)    Obesity (BMI 30.0-34.9)    Anemia 01/09/2018   Pulmonary embolism ([place]) 09/18/2017   Elevated serum creatinine 05/12/2017   Acute encephalopathy 04/20/2017   Hydronephrosis of left kidney 10/24/2016   Attention deficit 10/24/2016   Left leg weakness 08/13/2016   Chronic pain 08/13/2016   Mild renal insufficiency 08/13/2016   Left-sided weakness    High risk medication use 09/15/2015   Hip pain, bilateral 07/28/2015   Urinary hesitancy 07/28/2015   Multiple sclerosis exacerbation ([place]) 07/17/2015   Urinary disorder 06/11/2015   Gait disturbance 05/19/2015   Numbness 05/19/2015   Depression with anxiety  05/13/2015   Other fatigue 05/13/2015   Left knee pain 05/13/2015   Avascular necrosis of bones of both hips ([place]) 05/13/2015   Screening for HIV (human immunodeficiency virus) 07/08/2014   Essential hypertension, benign 11/19/2013   Anxiety state, unspecified 11/19/2013   Screen for STD (sexually transmitted disease) 11/01/2013   Encounter for routine gynecological examination 11/01/2013   Oral contraceptive use 10/20/2011   Multiple sclerosis ([place]) 08/08/2007   RASH AND OTHER NONSPECIFIC SKIN ERUPTION 06/22/2007   AVASCULAR NECROSIS, FEMORAL HEAD 03/11/2006   Essential hypertension 02/03/2006   MICROALBUMINURIA 02/03/2006   DVT, HX OF 02/03/2006    Past Surgical History:  Procedure Laterality Date   CYSTOSCOPY WITH RETROGRADE PYELOGRAM, URETEROSCOPY AND STENT PLACEMENT Bilateral 11/29/2017   Procedure: BILATERAL  RETROGRADE PYELOGRAM, LEFT DIAGNOSIC URETEROSCOPY AND STENT LEFT PLACEMENT;  Surgeon: Ardis Hughs, MD;  Location: [facility];  Service: Urology;  Laterality: Bilateral;   HIP ARTHROSCOPY Left 07-05-2013   dr pill $Remov'@[facility]'tSLab[facility]$    iliopsosas release, synovectomy   REVISION TOTAL HIP ARTHROPLASTY Right 03-28-2003    dr Alvan Dame $RemoveBe'@[facility]'rdXdjfmPB$    TOTAL HIP ARTHROPLASTY Bilateral left 11-05-2002  dr Alvan Dame $RemoveBe'@[facility]'oxziYoQYo$ ;   right 06/ 2004  $Rem'@[facility]$     OB History     Gravida  1   Para  1   Term  1   Preterm      AB      Living  1      SAB      IAB      Ectopic      Multiple      Live Births  1          Family History  Problem Relation Age of Onset   Healthy Mother    Healthy Father    Breast cancer Other        paternal grandmother dx age 69   Colon cancer Other        paternal grandmother   Hypertension Other    Diabetes Other     Social History   Tobacco Use   Smoking status: Never   Smokeless tobacco: Never  Vaping Use   Vaping Use: Never used  Substance Use Topics   Alcohol use: No   Drug use: No   Home Medications Prior to Admission  medications   Medication Sig Start Date End Date Taking? Authorizing Provider  acetaminophen (TYLENOL) 500 MG tablet Take 1,000 mg by mouth every 6 (six) hours as needed for moderate pain or headache.    [provider]  amantadine (SYMMETREL) 100 MG capsule Take 100 mg by mouth 2 (two) times daily. 09/27/20   [provider]  amitriptyline (ELAVIL) 25 MG tablet TAKE 1 TO 2 TABLETS(25 TO 50 MG) BY MOUTH AT BEDTIME Patient  taking differently: Take 25-50 mg by mouth at bedtime. 11/17/20   Sater, Nanine Means, MD  amLODipine (NORVASC) 5 MG tablet Take 1 tablet (5 mg total) by mouth daily. Patient taking differently: Take 5 mg by mouth daily after breakfast. 05/21/17   Arrien, Jimmy Picket, MD  amphetamine-dextroamphetamine (ADDERALL XR) 20 MG 24 hr capsule Take 1 capsule (20 mg total) by mouth daily. 02/04/21   Sater, Nanine Means, MD  apixaban (ELIQUIS) 5 MG TABS tablet Take 1 tablet (5 mg total) by mouth 2 (two) times daily. 09/21/20   Sater, Nanine Means, MD  baclofen (LIORESAL) 20 MG tablet Take 1 tablet (20 mg total) by mouth 4 (four) times daily as needed for muscle spasms. 09/16/19   Sater, Nanine Means, MD  busPIRone (BUSPAR) 15 MG tablet Take 1 tablet (15 mg total) by mouth 2 (two) times daily. Must keep follow up 09/21/20 for ongoing refills 08/18/20   Sater, Nanine Means, MD  citalopram (CELEXA) 20 MG tablet Take 1 tablet (20 mg total) by mouth daily. 02/19/19   Sater, Nanine Means, MD  dalfampridine 10 MG TB12 Take 1 tablet (10 mg total) by mouth 2 (two) times daily. 09/21/20   Sater, Nanine Means, MD  doxycycline (VIBRAMYCIN) 100 MG capsule Take 1 capsule (100 mg total) by mouth 2 (two) times daily. 12/26/20   Myna Bright M, PA-C  gabapentin (NEURONTIN) 800 MG tablet Take 1 tablet (800 mg total) by mouth 4 (four) times daily. 09/21/20   Sater, Nanine Means, MD  metoprolol succinate (TOPROL-XL) 50 MG 24 hr tablet Take 50 mg by mouth daily. 09/27/20   [provider]  Multiple Vitamin  (MULTIVITAMIN WITH MINERALS) TABS tablet Take 1 tablet by mouth daily. 09/20/17   Kayleen Memos, DO  norethindrone (MICRONOR) 0.35 MG tablet Take 1 tablet (0.35 mg total) by mouth daily. 11/19/18   Clarnce Flock, MD  omeprazole (PRILOSEC) 40 MG capsule Take 40 mg by mouth daily. 09/27/20   [provider]  oxyCODONE-acetaminophen (PERCOCET) 10-325 MG tablet Take 1 tablet by mouth every 4 (four) hours. 03/18/18   [provider]  Ozanimod HCl (ZEPOSIA) 0.92 MG CAPS TAKE ONE CAPSULE (0.$RemoveBefore'92MG'twXnVQpbXYFNN$ )  BY MOUTH ONE TIME DAILY Patient taking differently: Take 0.92 mg by mouth daily. 03/09/20   Sater, Nanine Means, MD  sucralfate (CARAFATE) 1 g tablet Take 1 tablet (1 g total) by mouth 4 (four) times daily -  with meals and at bedtime. 07/19/19   Molpus, John, MD  SUMAtriptan (IMITREX) 100 MG tablet TAKE 1 TABLET BY MOUTH 1 TIME FOR UP TO 1 DOSE AS NEEDED FOR MIGRAINE. MAY REPEAT IN 2 HOURS IF HEADACHE PERSISTS OR RECURS Patient taking differently: Take 100 mg by mouth daily as needed for migraine. 09/21/20   Sater, Nanine Means, MD  tamsulosin (FLOMAX) 0.4 MG CAPS capsule TAKE 1 CAPSULE(0.4 MG) BY MOUTH DAILY Patient taking differently: Take 0.4 mg by mouth daily. 08/05/20   Sater, Nanine Means, MD  tiZANidine (ZANAFLEX) 4 MG tablet TAKE 1 TABLET BY MOUTH EVERY NIGHT AT BEDTIME Patient taking differently: Take 4 mg by mouth at bedtime. 08/05/20   Sater, Nanine Means, MD  Oxcarbazepine (TRILEPTAL) 300 MG tablet Take 1 tablet (300 mg total) by mouth 2 (two) times daily. Patient not taking: Reported on 07/19/2019 11/11/15 07/19/19  Britt Bottom, MD    Allergies    Ace inhibitors, Amoxicillin, and Lisinopril  Review of Systems   Review of Systems  All other systems reviewed and are  negative. Ten systems reviewed and are negative for acute change, except as noted in the HPI.   Physical Exam Updated Vital Signs BP 118/77   Pulse (!) 106   Temp 98.5 F (36.9 C) (Oral)   Resp (!) 22   Ht 5\' 6"  (1.676 m)    Wt 91.6 kg   LMP 02/03/2021 (Approximate)   SpO2 100%   BMI 32.60 kg/m   Physical Exam Vitals and nursing note reviewed.  Constitutional:      General: She is not in acute distress.    Appearance: Normal appearance. She is not ill-appearing, toxic-appearing or diaphoretic.  HENT:     Head: Normocephalic and atraumatic.     Right Ear: External ear normal.     Left Ear: External ear normal.     Nose: Nose normal.     Mouth/Throat:     Mouth: Mucous membranes are moist.     Pharynx: Oropharynx is clear. No oropharyngeal exudate or posterior oropharyngeal erythema.  Eyes:     Extraocular Movements: Extraocular movements intact.  Cardiovascular:     Rate and Rhythm: Normal rate and regular rhythm.     Pulses: Normal pulses.     Heart sounds: Normal heart sounds. No murmur heard.   No friction rub. No gallop.  Pulmonary:     Effort: Pulmonary effort is normal. No respiratory distress.     Breath sounds: Normal breath sounds. No stridor. No wheezing, rhonchi or rales.  Abdominal:     General: Abdomen is flat.     Tenderness: There is no abdominal tenderness.  Genitourinary:    Comments: Patient declined FOBT. Musculoskeletal:        General: Normal range of motion.     Cervical back: Normal range of motion and neck supple. No tenderness.  Skin:    General: Skin is warm and dry.  Neurological:     General: No focal deficit present.     Mental Status: She is alert and oriented to person, place, and time.  Psychiatric:        Mood and Affect: Mood normal.        Behavior: Behavior normal.    ED Results / Procedures / Treatments   Labs (all labs ordered are listed, but only abnormal results are displayed) Labs Reviewed  FOLATE - Abnormal; Notable for the following components:      Result Value   Folate 5.3 (*)    All other components within normal limits  IRON AND TIBC - Abnormal; Notable for the following components:   TIBC 173 (*)    Saturation Ratios 91 (*)    All  other components within normal limits  FERRITIN - Abnormal; Notable for the following components:   Ferritin 903 (*)    All other components within normal limits  RETICULOCYTES - Abnormal; Notable for the following components:   Retic Ct Pct <0.4 (*)    RBC. 2.33 (*)    All other components within normal limits  COMPREHENSIVE METABOLIC PANEL - Abnormal; Notable for the following components:   Glucose, Bld 111 (*)    BUN 22 (*)    Creatinine, Ser 1.44 (*)    GFR, Estimated 47 (*)    All other components within normal limits  CBC WITH DIFFERENTIAL/PLATELET - Abnormal; Notable for the following components:   WBC 3.7 (*)    RBC 1.95 (*)    Hemoglobin 5.7 (*)    HCT 17.2 (*)    Lymphs Abs 0.1 (*)  All other components within normal limits  I-STAT BETA HCG BLOOD, ED ([facility], [facility], [facility] ONLY) - Abnormal; Notable for the following components:   I-stat hCG, quantitative 8.4 (*)    All other components within normal limits  RESP PANEL BY RT-PCR (FLU A&B, COVID) ARPGX2  VITAMIN B12  PROTIME-INR  TYPE AND SCREEN  PREPARE RBC (CROSSMATCH)    EKG EKG Interpretation  Date/Time:  Monday February 08 2021 19:10:42 EST Ventricular Rate:  104 PR Interval:  180 QRS Duration: 102 QT Interval:  349 QTC Calculation: 459 R Axis:   32 Text Interpretation: Sinus tachycardia Low voltage, precordial leads Borderline abnrm T, anterolateral leads Baseline wander in lead(s) V3 No significant change since last tracing Confirmed by Wandra Arthurs [phone]) on 02/08/2021 7:42:05 PM  Radiology No results found.  Procedures Procedures   Medications Ordered in ED Medications  0.9 %  sodium chloride infusion (has no administration in time range)  acetaminophen (TYLENOL) tablet 650 mg (650 mg Oral Given 02/08/21 2015)    ED Course  I have reviewed the triage vital signs and the nursing notes.  Pertinent labs & imaging results that were available during my care of the patient were reviewed by me and considered  in my medical decision making (see chart for details).    MDM Rules/Calculators/A&P                          Pt is a 40 y.o. female who presents to the emergency department due to symptomatic anemia.  History of similar symptoms in the past.  Most recent blood transfusion was on October 30.  Labs: CBC with a white count of 3.7, RBCs of 1.95, hemoglobin of 5.7, hematocrit of 17.2, lymphocytes of 0.1. Reticulocyte count percentage of less than 0.4. CMP with a glucose of 111, BUN of 22, creatinine 1.44, GFR 47. PT/INR within normal limits. Folate decreased at 5.3. TIBC of 173 with saturation ratios of 91. Ferritin of 903.  I, Rayna Sexton, PA-C, personally reviewed and evaluated these images and lab results as part of my medical decision-making.  Patient with a history of positive FOBT.  Declined repeat exam today.  Patient has no GI symptoms.  Denies any hematochezia or melena.  States that she recently had a colonoscopy with her gastroenterologist and was told that it was normal.  She was recently admitted and required a blood transfusion on October 30.  States that she recently became lightheaded once again so she went to [facility] and was told that her hemoglobin was 5 yesterday and was sent to the emergency department.  Today it appears to be 5.7.  Type and screen obtained.  Also obtained anemia panel.  Patient notes having antibodies in her blood.  Blood bank is currently sourcing 2 units and believe that they will be available for transfusion tomorrow morning.  Feel the patient will require admission for further management and to trend her H&H's.  This was discussed with the patient and she is amenable.  We will discuss with the medicine team.  Note: Portions of this report may have been transcribed using voice recognition software. Every effort was made to ensure accuracy; however, inadvertent computerized transcription errors may be present.   Final Clinical  Impression(s) / ED Diagnoses Final diagnoses:  Symptomatic anemia   Rx / DC Orders ED Discharge Orders     None        Rayna Sexton, PA-C 02/08/21 2238  Drenda Freeze, MD 02/08/21 2325

## 2021-02-08 NOTE — H&P (Signed)
History and Physical    Dawn Peters OVP:[id number] DOB: Nov 23, 1980 DOA: 02/08/2021  PCP: Dawn Perches, NP  Patient coming from: Home  I have personally briefly reviewed patient's old medical records in [place]  Chief Complaint: Worsening anemia  HPI: Dawn Peters is a 40 y.o. female with medical history significant for multiple sclerosis, PE/DVT on Eliquis, hypertension, CKD stage IIIb, hypoproliferative anemia presents with worsening anemia.  She reports having 3 days of lightheadedness/dizziness with standing.  She presented to [facility] yesterday and had lab work resulting today with hemoglobin of 5 and was asked to present to the ED.  Pt first presented with anemia with low reticulocyte count in September at [facility] concerning for hypoproliferative anemia. Hematology consulted and had bone marrow biopsy but no clear etiology. Had anemia with positive FOBT in October and was admitted at [facility]. Had upper GI series showing mild thickened jejunum but no overt bleeding. Recently had EGD and colonoscopy on 01/27/2021 with [facility].  Unable to see records in [place] but she reports being told it was normal.  There is planned small capsule endoscopy. However she has never seen melena or bright red blood.  She was receiving weekly Retacrit with hematology but last dose was in Oct.   ED Course: She was normotensive mildly tachycardic with HR 106. WBC of 3.7, Hgb of 5.7 with low retic count. <0.4. Folic mildly low at 5.3. Vitamin B12 normal at 224.  2u pRBC has been ordered but will be delayed likely until morning due to antibodies   Warm auto , allo antibodies, anti-c and anti-e   Review of Systems: Constitutional: No Weight Change, No Fever ENT/Mouth: No sore throat, No Rhinorrhea Eyes: No Eye Pain, No Vision Changes Cardiovascular: No Chest Pain, no SOB Respiratory: No Cough,  Gastrointestinal: No Nausea, No Vomiting, No Diarrhea, +  Constipation, No Pain Genitourinary: no Urinary Incontinence Musculoskeletal: No Arthralgias, No Myalgias Skin: No Skin Lesions, No Pruritus, Neuro: no Weakness, No Numbness Psych: No Anxiety/Panic, No Depression, no decrease appetite Heme/Lymph: No Bruising, No Bleeding  Past Medical History:  Diagnosis Date   Anticoagulant long-term use    Eliquis   Anxiety    Chronic pain    CKD (chronic kidney disease), stage III (HCC)    Depression    DVT, lower extremity, recurrent ([place]) 09/17/2017   left lower extremity-- treated with eliquis   Gait disturbance    due to MS   GERD (gastroesophageal reflux disease)    watch diet   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/p  THA   History of DVT of lower extremity 2007   History of encephalopathy 18/5909   acute metabolic encephalopathy secondary to MS,  resolved   History of MRSA infection 2004   right hip infection post THA   History of pulmonary embolus (PE) 2005   Hydronephrosis, left    w/ acute kidney injury 02/ 2019   Hypertension    Left-sided weakness    due to MS   Migraines    MS (multiple sclerosis) [facility]) neurologist-  dr Renne Musca   dx 2000--   Muscle spasticity    Neurogenic bladder    due to MS   Pulmonary embolism, bilateral ([place]) 09/17/2017   treated w/ eliquis   Strains to urinate    Urgency of urination     Past Surgical History:  Procedure Laterality Date   CYSTOSCOPY  WITH RETROGRADE PYELOGRAM, URETEROSCOPY AND STENT PLACEMENT Bilateral 11/29/2017   Procedure: BILATERAL  RETROGRADE PYELOGRAM, LEFT DIAGNOSIC URETEROSCOPY AND STENT LEFT PLACEMENT;  Surgeon: Ardis Hughs, MD;  Location: [facility];  Service: Urology;  Laterality: Bilateral;   HIP ARTHROSCOPY Left 07-05-2013   dr pill _0    iliopsosas release, synovectomy   REVISION TOTAL HIP ARTHROPLASTY Right 03-28-2003    dr Alvan Dame _1    TOTAL HIP ARTHROPLASTY Bilateral left  11-05-2002  dr Alvan Dame _2 ;   right 06/ 2004  _3      reports that she has never smoked. She has never used smokeless tobacco. She reports that she does not drink alcohol and does not use drugs. Social History  Allergies  Allergen Reactions   Ace Inhibitors Swelling   Amoxicillin Itching    Did it involve swelling of the face/tongue/throat, SOB, or low BP? Yes Did it involve sudden or severe rash/hives, skin peeling, or any reaction on the inside of your mouth or nose? No Did you need to seek medical attention at a hospital or doctor's office? No When did it last happen?      unknown  If all above answers are "NO", may proceed with cephalosporin use.;   Lisinopril Swelling    Lower lip swelling    Family History  Problem Relation Age of Onset   Healthy Mother    Healthy Father    Breast cancer Other        paternal grandmother dx age 68   Colon cancer Other        paternal grandmother   Hypertension Other    Diabetes Other      Prior to Admission medications   Medication Sig Start Date End Date Taking? Authorizing Provider  acetaminophen (TYLENOL) 500 MG tablet Take 1,000 mg by mouth every 6 (six) hours as needed for moderate pain or headache.    [provider]  amantadine (SYMMETREL) 100 MG capsule Take 100 mg by mouth 2 (two) times daily. 09/27/20   [provider]  amitriptyline (ELAVIL) 25 MG tablet TAKE 1 TO 2 TABLETS(25 TO 50 MG) BY MOUTH AT BEDTIME Patient taking differently: Take 25-50 mg by mouth at bedtime. 11/17/20   Sater, Nanine Means, MD  amLODipine (NORVASC) 5 MG tablet Take 1 tablet (5 mg total) by mouth daily. Patient taking differently: Take 5 mg by mouth daily after breakfast. 05/21/17   Arrien, Jimmy Picket, MD  amphetamine-dextroamphetamine (ADDERALL XR) 20 MG 24 hr capsule Take 1 capsule (20 mg total) by mouth daily. 02/04/21   Sater, Nanine Means, MD  apixaban (ELIQUIS) 5 MG TABS tablet Take 1 tablet (5 mg total) by mouth 2 (two) times  daily. 09/21/20   Sater, Nanine Means, MD  baclofen (LIORESAL) 20 MG tablet Take 1 tablet (20 mg total) by mouth 4 (four) times daily as needed for muscle spasms. 09/16/19   Sater, Nanine Means, MD  busPIRone (BUSPAR) 15 MG tablet Take 1 tablet (15 mg total) by mouth 2 (two) times daily. Must keep follow up 09/21/20 for ongoing refills 08/18/20   Sater, Nanine Means, MD  citalopram (CELEXA) 20 MG tablet Take 1 tablet (20 mg total) by mouth daily. 02/19/19   Sater, Nanine Means, MD  dalfampridine 10 MG TB12 Take 1 tablet (10 mg total) by mouth 2 (two) times daily. 09/21/20   Sater, Nanine Means, MD  doxycycline (VIBRAMYCIN) 100 MG capsule Take 1 capsule (100 mg total) by mouth 2 (two) times daily. 12/26/20   Raul Del,  Conner M, PA-C  gabapentin (NEURONTIN) 800 MG tablet Take 1 tablet (800 mg total) by mouth 4 (four) times daily. 09/21/20   Sater, Nanine Means, MD  metoprolol succinate (TOPROL-XL) 50 MG 24 hr tablet Take 50 mg by mouth daily. 09/27/20   [provider]  Multiple Vitamin (MULTIVITAMIN WITH MINERALS) TABS tablet Take 1 tablet by mouth daily. 09/20/17   Kayleen Memos, DO  norethindrone (MICRONOR) 0.35 MG tablet Take 1 tablet (0.35 mg total) by mouth daily. 11/19/18   Clarnce Flock, MD  omeprazole (PRILOSEC) 40 MG capsule Take 40 mg by mouth daily. 09/27/20   [provider]  oxyCODONE-acetaminophen (PERCOCET) 10-325 MG tablet Take 1 tablet by mouth every 4 (four) hours. 03/18/18   [provider]  Ozanimod HCl (ZEPOSIA) 0.92 MG CAPS TAKE ONE CAPSULE (0.92MG)  BY MOUTH ONE TIME DAILY Patient taking differently: Take 0.92 mg by mouth daily. 03/09/20   Sater, Nanine Means, MD  sucralfate (CARAFATE) 1 g tablet Take 1 tablet (1 g total) by mouth 4 (four) times daily -  with meals and at bedtime. 07/19/19   Molpus, John, MD  SUMAtriptan (IMITREX) 100 MG tablet TAKE 1 TABLET BY MOUTH 1 TIME FOR UP TO 1 DOSE AS NEEDED FOR MIGRAINE. MAY REPEAT IN 2 HOURS IF HEADACHE PERSISTS OR RECURS Patient taking  differently: Take 100 mg by mouth daily as needed for migraine. 09/21/20   Sater, Nanine Means, MD  tamsulosin (FLOMAX) 0.4 MG CAPS capsule TAKE 1 CAPSULE(0.4 MG) BY MOUTH DAILY Patient taking differently: Take 0.4 mg by mouth daily. 08/05/20   Sater, Nanine Means, MD  tiZANidine (ZANAFLEX) 4 MG tablet TAKE 1 TABLET BY MOUTH EVERY NIGHT AT BEDTIME Patient taking differently: Take 4 mg by mouth at bedtime. 08/05/20   Sater, Nanine Means, MD  Oxcarbazepine (TRILEPTAL) 300 MG tablet Take 1 tablet (300 mg total) by mouth 2 (two) times daily. Patient not taking: Reported on 07/19/2019 11/11/15 07/19/19  Britt Bottom, MD    Physical Exam: Vitals:   02/08/21 2016 02/08/21 2030 02/08/21 2100 02/08/21 2252  BP: 111/66 118/77 (!) 99/55 106/62  Pulse: 100 (!) 106 (!) 103 (!) 103  Resp: 20 (!) 22 (!) 21 18  Temp:      TempSrc:      SpO2: 100% 100% 100% 100%  Weight:      Height:        Constitutional: NAD, calm, comfortable, middle-aged female sitting upright in bed Vitals:   02/08/21 2016 02/08/21 2030 02/08/21 2100 02/08/21 2252  BP: 111/66 118/77 (!) 99/55 106/62  Pulse: 100 (!) 106 (!) 103 (!) 103  Resp: 20 (!) 22 (!) 21 18  Temp:      TempSrc:      SpO2: 100% 100% 100% 100%  Weight:      Height:       Eyes: lids and conjunctivae normal ENMT: Mucous membranes are moist.  Neck: normal, supple Respiratory: clear to auscultation bilaterally, no wheezing, no crackles. Normal respiratory effort. No accessory muscle use.  Cardiovascular: Regular rate and rhythm, no murmurs / rubs / gallops. No extremity edema. Abdomen: Soft, nondistended, no tenderness, no masses palpated. Bowel sounds positive.  Rectal: No noted external hemorrhoid or bright red blood per rectum.  Had skin discoloration from previous healed skin ulcers. Musculoskeletal: no clubbing / cyanosis. . Good ROM,  Normal muscle tone.  Skin: no rashes, lesions, ulcers. No induration Neurologic: CN 2-12 grossly intact.  Strength 5/5 in all 4.  Psychiatric: Normal judgment and insight. Alert and oriented x 3. Normal mood.     Labs on Admission: I have personally reviewed following labs and imaging studies  CBC: Recent Labs  Lab 02/08/21 2120  WBC 3.7*  NEUTROABS 3.0  HGB 5.7*  HCT 17.2*  MCV 88.2  PLT 413   Basic Metabolic Panel: Recent Labs  Lab 02/08/21 2120  NA 141  K 3.8  CL 106  CO2 26  GLUCOSE 111*  BUN 22*  CREATININE 1.44*  CALCIUM 9.0   GFR: Estimated Creatinine Clearance: 58 mL/min (A) (by C-G formula based on SCr of 1.44 mg/dL (H)). Liver Function Tests: Recent Labs  Lab 02/08/21 2120  AST 18  ALT 21  ALKPHOS 105  BILITOT 0.6  PROT 6.9  ALBUMIN 3.8   No results for input(s): LIPASE, AMYLASE in the last 168 hours. No results for input(s): AMMONIA in the last 168 hours. Coagulation Profile: Recent Labs  Lab 02/08/21 2120  INR 1.0   Cardiac Enzymes: No results for input(s): CKTOTAL, CKMB, CKMBINDEX, TROPONINI in the last 168 hours. BNP (last 3 results) No results for input(s): PROBNP in the last 8760 hours. HbA1C: No results for input(s): HGBA1C in the last 72 hours. CBG: No results for input(s): GLUCAP in the last 168 hours. Lipid Profile: No results for input(s): CHOL, HDL, LDLCALC, TRIG, CHOLHDL, LDLDIRECT in the last 72 hours. Thyroid Function Tests: No results for input(s): TSH, T4TOTAL, FREET4, T3FREE, THYROIDAB in the last 72 hours. Anemia Panel: Recent Labs    02/08/21 2120  VITAMINB12 204  FOLATE 5.3*  FERRITIN 903*  TIBC 173*  IRON 158  RETICCTPCT <0.4*   Urine analysis:    Component Value Date/Time   COLORURINE YELLOW (A) 09/01/2019 0503   APPEARANCEUR CLOUDY (A) 09/01/2019 0503   APPEARANCEUR Clear 02/19/2019 1710   LABSPEC 1.024 09/01/2019 0503   PHURINE 5.0 09/01/2019 0503   GLUCOSEU NEGATIVE 09/01/2019 0503   HGBUR LARGE (A) 09/01/2019 0503   BILIRUBINUR NEGATIVE 09/01/2019 0503   BILIRUBINUR Negative 02/19/2019 1710   KETONESUR NEGATIVE  09/01/2019 0503   PROTEINUR 30 (A) 09/01/2019 0503   UROBILINOGEN 0.2 12/15/2016 1136   NITRITE NEGATIVE 09/01/2019 0503   LEUKOCYTESUR SMALL (A) 09/01/2019 0503    Radiological Exams on Admission: No results found.    Assessment/Plan  Acute on chronic anemia -Patient with history of hypoproliferative anemia being worked up by hematology.  Thus far no clear etiology.  She was previously getting Reticrit weekly with hematology with last dose on 01/01/21 -Hgb of 5.3 today with low retic. Suspect this acute anemia also due to hypoproliferation.  -However she had FOBT positive results in Oct and is being followed by [facility]. Recently had EGD and colonoscopy on 01/27/2021 with [facility].  Unable to see records in [place] but she reports being told it was normal.  There is planned small capsule endoscopy. However she has never seen melena or bright red blood.  -will still hold Eliquis overnight and consult hematology in the morning before safely re-initating  - Has 2u pRBC ordered but will be delayed likely until morning due to antibodies   Warm auto , allo antibodies, anti-c and anti-e -Transfusion threshold hemoglobin less than 7  Folic deficiency -level 5.3. replete with oral Folic   Hx of PE/DVT -hold Eliquis overnight as above   CKD stage 3b -stable   Multiple sclerosis stable without flare ups  Continue Zeposia -abs lymphocyte count has remained low but stable at 0.1  Continue baclofen, amitriptyline, gabapentin, dalfampridine, amantadine   HTN Hold amlodipine, metoprolol while volume depleted   Depression/Anxiety  Continue citalopram and BuSpar  DVT prophylaxis:.SCD-holding Eliquis Code Status: Full Family Communication: Plan discussed with patient and fianc at bedside  disposition Plan: Home with at least 2 midnight stays  Consults called:  Admission status: inpatient  Level of care: Stepdown  Status is: Inpatient  Remains inpatient appropriate  because: Severe acute on chronic anemia requiring blood transfusion         Dawn Peters T Rayshawn Maney DO [facility]   If 7PM-7AM, please contact night-coverage [email or web address]   02/08/2021, 11:00 PM

## 2021-02-09 DIAGNOSIS — E538 Deficiency of other specified B group vitamins: Secondary | ICD-10-CM

## 2021-02-09 DIAGNOSIS — D619 Aplastic anemia, unspecified: Secondary | ICD-10-CM

## 2021-02-09 LAB — CBC
HCT: 13.5 % — ABNORMAL LOW (ref 36.0–46.0)
Hemoglobin: 4.5 g/dL — CL (ref 12.0–15.0)
MCH: 29.6 pg (ref 26.0–34.0)
MCHC: 33.3 g/dL (ref 30.0–36.0)
MCV: 88.8 fL (ref 80.0–100.0)
Platelets: 200 10*3/uL (ref 150–400)
RBC: 1.52 MIL/uL — ABNORMAL LOW (ref 3.87–5.11)
RDW: 13.2 % (ref 11.5–15.5)
WBC: 3.1 10*3/uL — ABNORMAL LOW (ref 4.0–10.5)
nRBC: 0 % (ref 0.0–0.2)

## 2021-02-09 LAB — HEMOGLOBIN AND HEMATOCRIT, BLOOD
HCT: 21.5 % — ABNORMAL LOW (ref 36.0–46.0)
Hemoglobin: 7.7 g/dL — ABNORMAL LOW (ref 12.0–15.0)

## 2021-02-09 LAB — MRSA NEXT GEN BY PCR, NASAL: MRSA by PCR Next Gen: NOT DETECTED

## 2021-02-09 MED ORDER — OZANIMOD HCL 0.92 MG PO CAPS: 0.9200 mg | ORAL_CAPSULE | ORAL | Status: DC

## 2021-02-09 MED ORDER — METOPROLOL TARTRATE 5 MG/5ML IV SOLN: 5.0000 mg | INTRAVENOUS | Status: DC | PRN

## 2021-02-09 MED ORDER — SENNOSIDES-DOCUSATE SODIUM 8.6-50 MG PO TABS: 1.0000 | ORAL_TABLET | Freq: Every evening | ORAL | Status: DC | PRN

## 2021-02-09 MED ORDER — TRAZODONE HCL 50 MG PO TABS
50.0000 mg | ORAL_TABLET | Freq: Every evening | ORAL | Status: DC | PRN
  Administered 2021-02-09 – 2021-02-10 (×2): 50 mg via ORAL
  Filled 2021-02-09 (×2): qty 1

## 2021-02-09 MED ORDER — DALFAMPRIDINE ER 10 MG PO TB12: 10.0000 mg | ORAL_TABLET | Freq: Two times a day (BID) | ORAL | Status: DC

## 2021-02-09 MED ORDER — TAMSULOSIN HCL 0.4 MG PO CAPS
0.4000 mg | ORAL_CAPSULE | Freq: Every day | ORAL | Status: DC
  Administered 2021-02-09 – 2021-02-11 (×3): 0.4 mg via ORAL
  Filled 2021-02-09 (×3): qty 1

## 2021-02-09 MED ORDER — OXYCODONE HCL 5 MG PO TABS
5.0000 mg | ORAL_TABLET | ORAL | Status: DC | PRN
  Administered 2021-02-09: 5 mg via ORAL
  Filled 2021-02-09: qty 1

## 2021-02-09 MED ORDER — GUAIFENESIN 100 MG/5ML PO LIQD: 5.0000 mL | ORAL | Status: DC | PRN

## 2021-02-09 MED ORDER — BACLOFEN 20 MG PO TABS: 20.0000 mg | ORAL_TABLET | Freq: Four times a day (QID) | ORAL | Status: DC | PRN

## 2021-02-09 MED ORDER — SUCRALFATE 1 G PO TABS
1.0000 g | ORAL_TABLET | Freq: Three times a day (TID) | ORAL | Status: DC
  Administered 2021-02-09 – 2021-02-11 (×10): 1 g via ORAL
  Filled 2021-02-09 (×9): qty 1

## 2021-02-09 MED ORDER — DOXYCYCLINE HYCLATE 100 MG PO CAPS: 100.0000 mg | ORAL_CAPSULE | Freq: Two times a day (BID) | ORAL | Status: DC

## 2021-02-09 MED ORDER — BUSPIRONE HCL 5 MG PO TABS
15.0000 mg | ORAL_TABLET | Freq: Two times a day (BID) | ORAL | Status: DC
  Administered 2021-02-09 – 2021-02-11 (×5): 15 mg via ORAL
  Filled 2021-02-09 (×5): qty 1

## 2021-02-09 MED ORDER — HYDRALAZINE HCL 20 MG/ML IJ SOLN: 10.0000 mg | INTRAMUSCULAR | Status: DC | PRN

## 2021-02-09 MED ORDER — AMPHETAMINE-DEXTROAMPHET ER 10 MG PO CP24
20.0000 mg | ORAL_CAPSULE | Freq: Every day | ORAL | Status: DC
  Administered 2021-02-09 – 2021-02-11 (×3): 20 mg via ORAL
  Filled 2021-02-09: qty 1
  Filled 2021-02-09 (×2): qty 2

## 2021-02-09 MED ORDER — CHLORHEXIDINE GLUCONATE CLOTH 2 % EX PADS
6.0000 | MEDICATED_PAD | Freq: Every day | CUTANEOUS | Status: DC
  Administered 2021-02-09 – 2021-02-10 (×2): 6 via TOPICAL

## 2021-02-09 MED ORDER — NORETHINDRONE 0.35 MG PO TABS
1.0000 | ORAL_TABLET | Freq: Every day | ORAL | Status: DC
Start: 2021-02-09 — End: 2021-02-11

## 2021-02-09 MED ORDER — ACETAMINOPHEN 325 MG PO TABS
650.0000 mg | ORAL_TABLET | Freq: Four times a day (QID) | ORAL | Status: DC | PRN
  Administered 2021-02-09: 650 mg via ORAL
  Filled 2021-02-09: qty 2

## 2021-02-09 MED ORDER — ACETAMINOPHEN 500 MG PO TABS
1000.0000 mg | ORAL_TABLET | Freq: Four times a day (QID) | ORAL | Status: DC | PRN
  Administered 2021-02-09: 1000 mg via ORAL
  Filled 2021-02-09: qty 2

## 2021-02-09 MED ORDER — AMITRIPTYLINE HCL 25 MG PO TABS
25.0000 mg | ORAL_TABLET | Freq: Every day | ORAL | Status: DC
  Administered 2021-02-09 – 2021-02-10 (×2): 50 mg via ORAL
  Filled 2021-02-09 (×2): qty 2

## 2021-02-09 MED ORDER — METOPROLOL SUCCINATE ER 50 MG PO TB24
50.0000 mg | ORAL_TABLET | Freq: Every day | ORAL | Status: DC
  Administered 2021-02-10 – 2021-02-11 (×2): 50 mg via ORAL
  Filled 2021-02-09: qty 1
  Filled 2021-02-09 (×2): qty 2

## 2021-02-09 MED ORDER — IPRATROPIUM-ALBUTEROL 0.5-2.5 (3) MG/3ML IN SOLN: 3.0000 mL | RESPIRATORY_TRACT | Status: DC | PRN

## 2021-02-09 MED ORDER — GABAPENTIN 400 MG PO CAPS
800.0000 mg | ORAL_CAPSULE | Freq: Four times a day (QID) | ORAL | Status: DC
  Administered 2021-02-09 – 2021-02-11 (×10): 800 mg via ORAL
  Filled 2021-02-09 (×10): qty 2

## 2021-02-09 MED ORDER — CITALOPRAM HYDROBROMIDE 20 MG PO TABS
20.0000 mg | ORAL_TABLET | Freq: Every day | ORAL | Status: DC
  Administered 2021-02-09 – 2021-02-11 (×3): 20 mg via ORAL
  Filled 2021-02-09 (×3): qty 1

## 2021-02-09 MED ORDER — PANTOPRAZOLE SODIUM 40 MG PO TBEC
40.0000 mg | DELAYED_RELEASE_TABLET | Freq: Every day | ORAL | Status: DC
  Administered 2021-02-09 – 2021-02-11 (×3): 40 mg via ORAL
  Filled 2021-02-09 (×3): qty 1

## 2021-02-09 MED ORDER — TIZANIDINE HCL 4 MG PO TABS
4.0000 mg | ORAL_TABLET | Freq: Every day | ORAL | Status: DC
  Administered 2021-02-09 – 2021-02-10 (×2): 4 mg via ORAL
  Filled 2021-02-09 (×2): qty 1

## 2021-02-09 MED ORDER — FOLIC ACID 1 MG PO TABS
1.0000 mg | ORAL_TABLET | Freq: Every day | ORAL | Status: DC
  Administered 2021-02-09 – 2021-02-11 (×3): 1 mg via ORAL
  Filled 2021-02-09 (×3): qty 1

## 2021-02-09 MED ORDER — AMANTADINE HCL 100 MG PO CAPS
100.0000 mg | ORAL_CAPSULE | Freq: Two times a day (BID) | ORAL | Status: DC
  Administered 2021-02-09 – 2021-02-11 (×5): 100 mg via ORAL
  Filled 2021-02-09 (×9): qty 1

## 2021-02-09 MED ORDER — FERROUS SULFATE 325 (65 FE) MG PO TABS
324.0000 mg | ORAL_TABLET | Freq: Every day | ORAL | Status: DC
  Administered 2021-02-09 – 2021-02-10 (×2): 324 mg via ORAL
  Filled 2021-02-09 (×2): qty 1

## 2021-02-09 NOTE — Progress Notes (Signed)
PROGRESS NOTE    Dawn Peters  IRW:[id number] DOB: [birth year] DOA: 02/08/2021 PCP: Carylon Perches, NP   Brief Narrative:  40 year old with history of multiple sclerosis, PE/DVT on Eliquis, HTN, CKD stage IIIb, hypoproliferative anemia admitted for worsening symptomatic anemia and hemoglobin of 5.7 with low reticulocyte count and folic acid levels.  Previously patient has undergone bone marrow biopsy without any clear etiology, was also Hemoccult positive back in October and was admitted at [facility].  Underwent extensive work-up including EGD/colonoscopy and was told it was normal.  Small capsule endoscopy was planned but never completed.  She has been receiving weekly EPO by hematology.  Upon admission ordered 2 units of PRBC.   Assessment & Plan:   Principal Problem:   Acute on chronic anemia Active Problems:   Multiple sclerosis (HCC)   DVT, HX OF   Essential hypertension, benign   Depression with anxiety   Stage 3 chronic kidney disease (HCC)   Hypoproliferative anemia (HCC)   Folic acid deficiency   Acute on chronic anemia; Hb 4.5 -Some form of hypoproliferative disorder.  Previously has undergone bone marrow biopsy showed hypercellular marrow with trilineage hematopoiesis and fibrosis but no infiltrative disease.  Underwent EGD/colonoscopy at [facility] which was unremarkable.  Small capsule endoscopy has been planned.She will call and set up an appt.  - 2 units PRBC, delayed until morning due to antibodies - Consulted hematology- Dr Alen Blew recommedned transfusion and ouptn follow up - Eliquis currently on hold   Folic deficiency -Repletion ordered   Hx of PE/DVT -Eliquis on hold, she will need to discuss with her PCP for long term plans. Ptn understands this.    CKD stage 3b -Creatinine is currently stable   Multiple sclerosis -Continue her home medications at this time-dalfampridine, amantadine, tizanidine, ozanimod.  Follows outpatient neurology   HTN -Amlodipine  and metoprolol currently on hold   Depression/Anxiety  -Continue Elavil and Celexa  Peripheral neuropathy - On gabapentin 800 mg 4 times daily  Essential hypertension - PPI, Carafate      DVT prophylaxis: SCDs Start: 02/09/21 0004 Code Status: Full code Family Communication:  Family at bedside  Status is: Inpatient  Remains inpatient appropriate because: Needs PRBC tranfusion.      Subjective: Still has some symptomatic anemia/shortness of breath with exertion.  No obvious signs of blood loss.  Review of Systems Otherwise negative except as per HPI, including: General: Denies fever, chills, night sweats or unintended weight loss. Resp: Denies cough, wheezing, shortness of breath. Cardiac: Denies chest pain, palpitations, orthopnea, paroxysmal nocturnal dyspnea. GI: Denies abdominal pain, nausea, vomiting, diarrhea or constipation GU: Denies dysuria, frequency, hesitancy or incontinence MS: Denies muscle aches, joint pain or swelling Neuro: Denies headache, neurologic deficits (focal weakness, numbness, tingling), abnormal gait Psych: Denies anxiety, depression, SI/HI/AVH Skin: Denies new rashes or lesions ID: Denies sick contacts, exotic exposures, travel  Examination:  General exam: Appears calm and comfortable  Respiratory system: Clear to auscultation. Respiratory effort normal. Cardiovascular system: S1 & S2 heard, RRR. No JVD, murmurs, rubs, gallops or clicks. No pedal edema. Gastrointestinal system: Abdomen is nondistended, soft and nontender. No organomegaly or masses felt. Normal bowel sounds heard. Central nervous system: Alert and oriented. No focal neurological deficits. Extremities: Symmetric 5 x 5 power. Skin: No rashes, lesions or ulcers Psychiatry: Judgement and insight appear normal. Mood & affect appropriate.     Objective: Vitals:   02/09/21 0100 02/09/21 0130 02/09/21 0520 02/09/21 0741  BP: 106/69 116/76 119/78 133/83  Pulse: 96 97  93 100   Resp: (!) _0 Temp:    98.3 F (36.8 C)  TempSrc:    Oral  SpO2: 99% 99% 100% 100%  Weight:      Height:       No intake or output data in the 24 hours ending 02/09/21 0810 Filed Weights   02/08/21 1752  Weight: 91.6 kg     Data Reviewed:   CBC: Recent Labs  Lab 02/08/21 2120 02/09/21 0523  WBC 3.7* 3.1*  NEUTROABS 3.0  --   HGB 5.7* 4.5*  HCT 17.2* 13.5*  MCV 88.2 88.8  PLT 218 528   Basic Metabolic Panel: Recent Labs  Lab 02/08/21 2120  NA 141  K 3.8  CL 106  CO2 26  GLUCOSE 111*  BUN 22*  CREATININE 1.44*  CALCIUM 9.0   GFR: Estimated Creatinine Clearance: 58 mL/min (A) (by C-G formula based on SCr of 1.44 mg/dL (H)). Liver Function Tests: Recent Labs  Lab 02/08/21 2120  AST 18  ALT 21  ALKPHOS 105  BILITOT 0.6  PROT 6.9  ALBUMIN 3.8   No results for input(s): LIPASE, AMYLASE in the last 168 hours. No results for input(s): AMMONIA in the last 168 hours. Coagulation Profile: Recent Labs  Lab 02/08/21 2120  INR 1.0   Cardiac Enzymes: No results for input(s): CKTOTAL, CKMB, CKMBINDEX, TROPONINI in the last 168 hours. BNP (last 3 results) No results for input(s): PROBNP in the last 8760 hours. HbA1C: No results for input(s): HGBA1C in the last 72 hours. CBG: No results for input(s): GLUCAP in the last 168 hours. Lipid Profile: No results for input(s): CHOL, HDL, LDLCALC, TRIG, CHOLHDL, LDLDIRECT in the last 72 hours. Thyroid Function Tests: No results for input(s): TSH, T4TOTAL, FREET4, T3FREE, THYROIDAB in the last 72 hours. Anemia Panel: Recent Labs    02/08/21 2120  VITAMINB12 204  FOLATE 5.3*  FERRITIN 903*  TIBC 173*  IRON 158  RETICCTPCT <0.4*   Sepsis Labs: No results for input(s): PROCALCITON, LATICACIDVEN in the last 168 hours.  Recent Results (from the past 240 hour(s))  Resp Panel by RT-PCR (Flu A&B, Covid) Nasopharyngeal Swab     Status: None   Collection Time: 02/08/21  6:09 PM   Specimen:  Nasopharyngeal Swab; Nasopharyngeal(NP) swabs in vial transport medium  Result Value Ref Range Status   SARS Coronavirus 2 by RT PCR NEGATIVE NEGATIVE Final    Comment: (NOTE) SARS-CoV-2 target nucleic acids are NOT DETECTED.  The SARS-CoV-2 RNA is generally detectable in upper respiratory specimens during the acute phase of infection. The lowest concentration of SARS-CoV-2 viral copies this assay can detect is 138 copies/mL. A negative result does not preclude SARS-Cov-2 infection and should not be used as the sole basis for treatment or other patient management decisions. A negative result may occur with  improper specimen collection/handling, submission of specimen other than nasopharyngeal swab, presence of viral mutation(s) within the areas targeted by this assay, and inadequate number of viral copies(<138 copies/mL). A negative result must be combined with clinical observations, patient history, and epidemiological information. The expected result is Negative.  Fact Sheet for Patients:  EntrepreneurPulse.com.au  Fact Sheet for Healthcare Providers:  IncredibleEmployment.be  This test is no t yet approved or cleared by the [place] FDA and  has been authorized for detection and/or diagnosis of SARS-CoV-2 by FDA under an Emergency Use Authorization (EUA). This EUA will remain  in effect (meaning this test can be used) for the  duration of the COVID-19 declaration under Section 564(b)(1) of the Act, 21 U.S.C.section 360bbb-3(b)(1), unless the authorization is terminated  or revoked sooner.       Influenza A by PCR NEGATIVE NEGATIVE Final   Influenza B by PCR NEGATIVE NEGATIVE Final    Comment: (NOTE) The Xpert Xpress SARS-CoV-2/FLU/RSV plus assay is intended as an aid in the diagnosis of influenza from Nasopharyngeal swab specimens and should not be used as a sole basis for treatment. Nasal washings and aspirates are unacceptable for  Xpert Xpress SARS-CoV-2/FLU/RSV testing.  Fact Sheet for Patients: EntrepreneurPulse.com.au  Fact Sheet for Healthcare Providers: IncredibleEmployment.be  This test is not yet approved or cleared by the [place] FDA and has been authorized for detection and/or diagnosis of SARS-CoV-2 by FDA under an Emergency Use Authorization (EUA). This EUA will remain in effect (meaning this test can be used) for the duration of the COVID-19 declaration under Section 564(b)(1) of the Act, 21 U.S.C. section 360bbb-3(b)(1), unless the authorization is terminated or revoked.  Performed at [facility], [place] [address], [place], [place] [address]          Radiology Studies: No results found.      Scheduled Meds:  amantadine  100 mg Oral BID   amitriptyline  25-50 mg Oral QHS   busPIRone  15 mg Oral BID   Chlorhexidine Gluconate Cloth  6 each Topical Daily   citalopram  20 mg Oral Daily   dalfampridine  10 mg Oral BID   folic acid  1 mg Oral Daily   gabapentin  800 mg Oral QID   norethindrone  1 tablet Oral Daily   Ozanimod HCl  0.92 mg Oral QODAY   pantoprazole  40 mg Oral Daily   sucralfate  1 g Oral TID WC & HS   tamsulosin  0.4 mg Oral Daily   tiZANidine  4 mg Oral QHS   Continuous Infusions:  sodium chloride Stopped (02/09/21 0433)     LOS: 1 day   Time spent= 35 mins    Kenney Going Arsenio Loader, MD [facility]  If 7PM-7AM, please contact night-coverage  02/09/2021, 8:10 AM

## 2021-02-10 ENCOUNTER — Telehealth: Payer: Self-pay | Admitting: Neurology

## 2021-02-10 LAB — TYPE AND SCREEN
ABO/RH(D): B POS
Antibody Screen: POSITIVE
DAT, IgG: POSITIVE
Donor AG Type: NEGATIVE
Donor AG Type: NEGATIVE
Unit division: 0
Unit division: 0

## 2021-02-10 LAB — CBC
HCT: 22.3 % — ABNORMAL LOW (ref 36.0–46.0)
Hemoglobin: 7.8 g/dL — ABNORMAL LOW (ref 12.0–15.0)
MCH: 30.5 pg (ref 26.0–34.0)
MCHC: 35 g/dL (ref 30.0–36.0)
MCV: 87.1 fL (ref 80.0–100.0)
Platelets: 200 10*3/uL (ref 150–400)
RBC: 2.56 MIL/uL — ABNORMAL LOW (ref 3.87–5.11)
RDW: 14.4 % (ref 11.5–15.5)
WBC: 2.6 10*3/uL — ABNORMAL LOW (ref 4.0–10.5)
nRBC: 0 % (ref 0.0–0.2)

## 2021-02-10 LAB — BPAM RBC
Blood Product Expiration Date: 202212282359
Blood Product Expiration Date: 202212312359
ISSUE DATE / TIME: 202212061136
ISSUE DATE / TIME: 202212061526
Unit Type and Rh: 7300
Unit Type and Rh: 7300

## 2021-02-10 LAB — BASIC METABOLIC PANEL
Anion gap: 7 (ref 5–15)
BUN: 21 mg/dL — ABNORMAL HIGH (ref 6–20)
CO2: 26 mmol/L (ref 22–32)
Calcium: 8.5 mg/dL — ABNORMAL LOW (ref 8.9–10.3)
Chloride: 107 mmol/L (ref 98–111)
Creatinine, Ser: 1.32 mg/dL — ABNORMAL HIGH (ref 0.44–1.00)
GFR, Estimated: 52 mL/min — ABNORMAL LOW (ref >60)
Glucose, Bld: 108 mg/dL — ABNORMAL HIGH (ref 70–99)
Potassium: 3.7 mmol/L (ref 3.5–5.1)
Sodium: 140 mmol/L (ref 135–145)

## 2021-02-10 LAB — MAGNESIUM: Magnesium: 1.9 mg/dL (ref 1.7–2.4)

## 2021-02-10 NOTE — Progress Notes (Signed)
PROGRESS NOTE    Dawn Peters  ZOX:[id number] DOB: 1980-11-24 DOA: 02/08/2021 PCP: Carylon Perches, NP   Brief Narrative:  40 year old with history of multiple sclerosis, PE/DVT on Eliquis, HTN, CKD stage IIIb, hypoproliferative anemia admitted for worsening symptomatic anemia and hemoglobin of 5.7 with low reticulocyte count and folic acid levels.  Previously patient has undergone bone marrow biopsy without any clear etiology, was also Hemoccult positive back in October and was admitted at [facility].  Underwent extensive work-up including EGD/colonoscopy and was told it was normal.  Small capsule endoscopy was planned but never completed.  She has been receiving weekly EPO by hematology.  Upon admission ordered 2 units of PRBC.   Assessment & Plan:   Principal Problem:   Acute on chronic anemia Active Problems:   Multiple sclerosis (HCC)   DVT, HX OF   Essential hypertension, benign   Depression with anxiety   Stage 3 chronic kidney disease (HCC)   Hypoproliferative anemia (HCC)   Folic acid deficiency   Acute on chronic anemia; Hb 4.5 Symptomatic anemia -Some form of hypoproliferative disorder.  Previously has undergone bone marrow biopsy showed hypercellular marrow with trilineage hematopoiesis and fibrosis but no infiltrative disease.  Underwent EGD/colonoscopy at [facility] which was unremarkable.  Small capsule endoscopy has been planned.She will call and set up an appt.  -Status post units PRBC, hemoglobin improved this morning.  No obvious evidence of bleeding. - Consulted hematology- Dr Alen Blew recommedned transfusion and ouptn follow up - Eliquis currently on hold   Folic deficiency -Repletion ordered   Hx of PE/DVT -Eliquis on hold, she will need to discuss with her PCP for long term plans. Ptn understands this.  She tells me this was at least 3-4 years ago.   CKD stage 3b -Creatinine is currently stable   Multiple sclerosis -Continue her home medications at this  time-dalfampridine, amantadine, tizanidine, ozanimod.  Follows outpatient neurology   HTN -Amlodipine and metoprolol currently on hold   Depression/Anxiety  -Continue Elavil and Celexa  Peripheral neuropathy - On gabapentin 800 mg 4 times daily  Essential hypertension - PPI, Carafate      DVT prophylaxis: SCDs Start: 02/09/21 0004 Code Status: Full code Family Communication: Family is at bedside  Status is: Inpatient  Remains inpatient appropriate because: Transfer patient to [place] unit.  Monitor her hemoglobin over next 24 hours and her symptomatic anemia.  If improved she can go home tomorrow     Subjective: Still having some exertional dyspnea but improved compared to what it was before.  Denies any obvious evidence of blood loss.   Examination:  Constitutional: Not in acute distress Respiratory: Clear to auscultation bilaterally Cardiovascular: Normal sinus rhythm, no rubs Abdomen: Nontender nondistended good bowel sounds Musculoskeletal: No edema noted Skin: No rashes seen Neurologic: CN 2-12 grossly intact.  And nonfocal Psychiatric: Normal judgment and insight. Alert and oriented x 3. Normal mood. Objective: Vitals:   02/10/21 0200 02/10/21 0300 02/10/21 0400 02/10/21 0720  BP: 116/80 97/63 104/75   Pulse: 97 92 84   Resp: 13 12 (!) 9   Temp:   97.9 F (36.6 C) 97.7 F (36.5 C)  TempSrc:   Oral Axillary  SpO2: 95% 96% 99%   Weight:      Height:        Intake/Output Summary (Last 24 hours) at 02/10/2021 1001 Last data filed at 02/09/2021 1840 Gross per 24 hour  Intake 1836 ml  Output --  Net 1836 ml   [employer]  02/08/21 1752 02/09/21 0825  Weight: 91.6 kg 90.5 kg     Data Reviewed:   CBC: Recent Labs  Lab 02/08/21 2120 02/09/21 0523 02/09/21 2011 02/10/21 0235  WBC 3.7* 3.1*  --  2.6*  NEUTROABS 3.0  --   --   --   HGB 5.7* 4.5* 7.7* 7.8*  HCT 17.2* 13.5* 21.5* 22.3*  MCV 88.2 88.8  --  87.1  PLT 218 200  --  397    Basic Metabolic Panel: Recent Labs  Lab 02/08/21 2120 02/10/21 0235  NA 141 140  K 3.8 3.7  CL 106 107  CO2 26 26  GLUCOSE 111* 108*  BUN 22* 21*  CREATININE 1.44* 1.32*  CALCIUM 9.0 8.5*  MG  --  1.9   GFR: Estimated Creatinine Clearance: 62.9 mL/min (A) (by C-G formula based on SCr of 1.32 mg/dL (H)). Liver Function Tests: Recent Labs  Lab 02/08/21 2120  AST 18  ALT 21  ALKPHOS 105  BILITOT 0.6  PROT 6.9  ALBUMIN 3.8   No results for input(s): LIPASE, AMYLASE in the last 168 hours. No results for input(s): AMMONIA in the last 168 hours. Coagulation Profile: Recent Labs  Lab 02/08/21 2120  INR 1.0   Cardiac Enzymes: No results for input(s): CKTOTAL, CKMB, CKMBINDEX, TROPONINI in the last 168 hours. BNP (last 3 results) No results for input(s): PROBNP in the last 8760 hours. HbA1C: No results for input(s): HGBA1C in the last 72 hours. CBG: No results for input(s): GLUCAP in the last 168 hours. Lipid Profile: No results for input(s): CHOL, HDL, LDLCALC, TRIG, CHOLHDL, LDLDIRECT in the last 72 hours. Thyroid Function Tests: No results for input(s): TSH, T4TOTAL, FREET4, T3FREE, THYROIDAB in the last 72 hours. Anemia Panel: Recent Labs    02/08/21 2120  VITAMINB12 204  FOLATE 5.3*  FERRITIN 903*  TIBC 173*  IRON 158  RETICCTPCT <0.4*   Sepsis Labs: No results for input(s): PROCALCITON, LATICACIDVEN in the last 168 hours.  Recent Results (from the past 240 hour(s))  Resp Panel by RT-PCR (Flu A&B, Covid) Nasopharyngeal Swab     Status: None   Collection Time: 02/08/21  6:09 PM   Specimen: Nasopharyngeal Swab; Nasopharyngeal(NP) swabs in vial transport medium  Result Value Ref Range Status   SARS Coronavirus 2 by RT PCR NEGATIVE NEGATIVE Final    Comment: (NOTE) SARS-CoV-2 target nucleic acids are NOT DETECTED.  The SARS-CoV-2 RNA is generally detectable in upper respiratory specimens during the acute phase of infection. The lowest concentration  of SARS-CoV-2 viral copies this assay can detect is 138 copies/mL. A negative result does not preclude SARS-Cov-2 infection and should not be used as the sole basis for treatment or other patient management decisions. A negative result may occur with  improper specimen collection/handling, submission of specimen other than nasopharyngeal swab, presence of viral mutation(s) within the areas targeted by this assay, and inadequate number of viral copies(<138 copies/mL). A negative result must be combined with clinical observations, patient history, and epidemiological information. The expected result is Negative.  Fact Sheet for Patients:  EntrepreneurPulse.com.au  Fact Sheet for Healthcare Providers:  IncredibleEmployment.be  This test is no t yet approved or cleared by the [place] FDA and  has been authorized for detection and/or diagnosis of SARS-CoV-2 by FDA under an Emergency Use Authorization (EUA). This EUA will remain  in effect (meaning this test can be used) for the duration of the COVID-19 declaration under Section 564(b)(1) of the Act, 21 U.S.C.section 360bbb-3(b)(1),  unless the authorization is terminated  or revoked sooner.       Influenza A by PCR NEGATIVE NEGATIVE Final   Influenza B by PCR NEGATIVE NEGATIVE Final    Comment: (NOTE) The Xpert Xpress SARS-CoV-2/FLU/RSV plus assay is intended as an aid in the diagnosis of influenza from Nasopharyngeal swab specimens and should not be used as a sole basis for treatment. Nasal washings and aspirates are unacceptable for Xpert Xpress SARS-CoV-2/FLU/RSV testing.  Fact Sheet for Patients: EntrepreneurPulse.com.au  Fact Sheet for Healthcare Providers: IncredibleEmployment.be  This test is not yet approved or cleared by the [place] FDA and has been authorized for detection and/or diagnosis of SARS-CoV-2 by FDA under an Emergency Use  Authorization (EUA). This EUA will remain in effect (meaning this test can be used) for the duration of the COVID-19 declaration under Section 564(b)(1) of the Act, 21 U.S.C. section 360bbb-3(b)(1), unless the authorization is terminated or revoked.  Performed at [facility], [place] [address] [place], [place] [address]   MRSA Next Gen by PCR, Nasal     Status: None   Collection Time: 02/09/21  8:00 AM   Specimen: Nasal Mucosa; Nasal Swab  Result Value Ref Range Status   MRSA by PCR Next Gen NOT DETECTED NOT DETECTED Final    Comment: (NOTE) The GeneXpert MRSA Assay (FDA approved for NASAL specimens only), is one component of a comprehensive MRSA colonization surveillance program. It is not intended to diagnose MRSA infection nor to guide or monitor treatment for MRSA infections. Test performance is not FDA approved in patients less than 56 years old. Performed at [facility], [place] [address], [place], [place] [address]          Radiology Studies: No results found.      Scheduled Meds:  amantadine  100 mg Oral BID   amitriptyline  25-50 mg Oral QHS   amphetamine-dextroamphetamine  20 mg Oral Daily   busPIRone  15 mg Oral BID   Chlorhexidine Gluconate Cloth  6 each Topical Daily   citalopram  20 mg Oral Daily   dalfampridine  10 mg Oral BID   ferrous sulfate  324 mg Oral Q supper   folic acid  1 mg Oral Daily   gabapentin  800 mg Oral QID   metoprolol succinate  50 mg Oral Daily   norethindrone  1 tablet Oral Daily   Ozanimod HCl  0.92 mg Oral QODAY   pantoprazole  40 mg Oral Daily   sucralfate  1 g Oral TID WC & HS   tamsulosin  0.4 mg Oral Daily   tiZANidine  4 mg Oral QHS   Continuous Infusions:  sodium chloride Stopped (02/09/21 0433)     LOS: 2 days   Time spent= 35 mins    Jurnie Garritano Arsenio Loader, MD [facility]  If 7PM-7AM, please contact night-coverage  02/10/2021, 10:01 AM

## 2021-02-10 NOTE — Telephone Encounter (Signed)
PA completed on [facility] rx KEY: [id number] Will await determination

## 2021-02-10 NOTE — TOC Progression Note (Signed)
Transition of Care [facility]) - Progression Note    Patient Details  Name: Mele Sylvester MRN: [id number] Date of Birth: [birth year]  [facility]) CM/SW Contact  Purcell Mouton, RN Phone Number: 02/10/2021, 3:43 PM  Clinical Narrative:    TOC reviewed chart.  Pt is from home with parents.    Expected Discharge Plan: Home/Self Care Barriers to Discharge: No Barriers Identified  Expected Discharge Plan and Services Expected Discharge Plan: Home/Self Care       Living arrangements for the past 2 months: Single Family Home                                       Social Determinants of Health (SDOH) Interventions    Readmission Risk Interventions No flowsheet data found.

## 2021-02-10 NOTE — Progress Notes (Signed)
Pt received on unit from ICU. Patient oriented to call bell and phone. Vitals stable. Patient AA&O and eating dinner.

## 2021-02-11 LAB — BASIC METABOLIC PANEL
Anion gap: 7 (ref 5–15)
BUN: 19 mg/dL (ref 6–20)
CO2: 24 mmol/L (ref 22–32)
Calcium: 8.4 mg/dL — ABNORMAL LOW (ref 8.9–10.3)
Chloride: 107 mmol/L (ref 98–111)
Creatinine, Ser: 1.17 mg/dL — ABNORMAL HIGH (ref 0.44–1.00)
GFR, Estimated: 60 mL/min — ABNORMAL LOW (ref >60)
Glucose, Bld: 117 mg/dL — ABNORMAL HIGH (ref 70–99)
Potassium: 3.7 mmol/L (ref 3.5–5.1)
Sodium: 138 mmol/L (ref 135–145)

## 2021-02-11 LAB — CBC
HCT: 21.9 % — ABNORMAL LOW (ref 36.0–46.0)
Hemoglobin: 7.6 g/dL — ABNORMAL LOW (ref 12.0–15.0)
MCH: 31.3 pg (ref 26.0–34.0)
MCHC: 34.7 g/dL (ref 30.0–36.0)
MCV: 90.1 fL (ref 80.0–100.0)
Platelets: 231 10*3/uL (ref 150–400)
RBC: 2.43 MIL/uL — ABNORMAL LOW (ref 3.87–5.11)
RDW: 14.4 % (ref 11.5–15.5)
WBC: 2.7 10*3/uL — ABNORMAL LOW (ref 4.0–10.5)
nRBC: 0 % (ref 0.0–0.2)

## 2021-02-11 LAB — MAGNESIUM: Magnesium: 1.8 mg/dL (ref 1.7–2.4)

## 2021-02-11 MED ORDER — FOLIC ACID 1 MG PO TABS: 1.0000 mg | ORAL_TABLET | Freq: Every day | ORAL | 0 refills | Status: AC

## 2021-02-11 NOTE — Discharge Summary (Signed)
Physician Discharge Summary  Dawn Peters UUV:[id number] DOB: Sep 16, 1980 DOA: 02/08/2021  PCP: Carylon Perches, NP  Admit date: 02/08/2021 Discharge date: 02/11/2021  Admitted From: Home Disposition: Home  Recommendations for Outpatient Follow-up:  Follow up with PCP in 1-2 weeks Please obtain BMP/CBC in one week your next doctors visit.  Daily folic acid, iron supplements.  As needed bowel regimen she may need to be on weekly EPO or periodic transfusion. Her Eliquis has been stopped for now.  I advised her to discuss this with her PCP during her next visit in next 7-10 days.  Depending on her previous GI work-up, she can follow-up with gastroenterology as well Follow-up outpatient with Dr. Alen Blew from hematology   Discharge Condition: Stable CODE STATUS: Full code Diet recommendation: 2 g salt  Brief/Interim Summary: 40 year old with history of multiple sclerosis, PE/DVT on Eliquis, HTN, CKD stage IIIb, hypoproliferative anemia admitted for worsening symptomatic anemia and hemoglobin of 5.7 with low reticulocyte count and folic acid levels.  Previously patient has undergone bone marrow biopsy without any clear etiology, was also Hemoccult positive back in October and was admitted at [facility].  Underwent extensive work-up including EGD/colonoscopy and was told it was normal.  Small capsule endoscopy was planned but never completed.  She has been receiving weekly EPO by hematology.  Upon admission ordered 2 units of PRBC.  Over the course of 48 hours and her hemoglobin remained stable and on discharge it was 7.6.  Case was discussed with Dr. Alen Blew who recommended outpatient follow-up and periodic transfusion/EPO as necessary. She is medically stable for discharge today.        Acute on chronic anemia; Hb 4.5 Symptomatic anemia -Some form of hypoproliferative disorder.  Previously has undergone bone marrow biopsy showed hypercellular marrow with trilineage hematopoiesis and fibrosis but  no infiltrative disease.  Underwent EGD/colonoscopy at [facility] which was unremarkable.  Small capsule endoscopy has been planned.she will call to make appointment with GI and outpatient hematology.  She will need outpatient repeat blood work in the next week.  Her Eliquis has currently been held and have advised her to discuss the history of her PE/DVT or Eliquis indication with her outpatient PCP.  She tells me it has been at least 3 to 4 years therefore it makes me things that she has completed her treatment unless if there was any other indication.   Folic deficiency -Repletion ordered.  Folic acid prescribed on discharge   Hx of PE/DVT -Eliquis on hold, she will need to discuss with her PCP for long term plans. Ptn understands this.  She tells me this was at least 3-4 years ago.   CKD stage 3b -Creatinine is currently stable   Multiple sclerosis -Continue her home medications at this time-dalfampridine, amantadine, tizanidine, ozanimod.  Follows outpatient neurology   HTN -Amlodipine and metoprolol currently on hold   Depression/Anxiety  -Continue Elavil and Celexa   Peripheral neuropathy - On gabapentin 800 mg 4 times daily   Essential hypertension - PPI, Carafate      Body mass index is 32.2 kg/m.         Discharge Diagnoses:  Principal Problem:   Acute on chronic anemia Active Problems:   Multiple sclerosis (HCC)   DVT, HX OF   Essential hypertension, benign   Depression with anxiety   Stage 3 chronic kidney disease (HCC)   Hypoproliferative anemia (HCC)   Folic acid deficiency      Consultations: Curbside hematology, Dr Dr. Alen Blew  Subjective: Feeling okay  no complaints  Discharge Exam: Vitals:   02/10/21 2048 02/11/21 0448  BP: 113/76 115/74  Pulse: (!) 103 97  Resp: 16 16  Temp: 98.1 F (36.7 C) 98.2 F (36.8 C)  SpO2: 100% 100%   Vitals:   02/10/21 1735 02/10/21 1758 02/10/21 2048 02/11/21 0448  BP:  137/83 113/76 115/74  Pulse:  99  (!) 103 97  Resp: $Remo'18 16 16 16  'bmNLg$ Temp:  98.5 F (36.9 C) 98.1 F (36.7 C) 98.2 F (36.8 C)  TempSrc:  Oral    SpO2:  100% 100% 100%  Weight:      Height:        General: Pt is alert, awake, not in acute distress Cardiovascular: RRR, S1/S2 +, no rubs, no gallops Respiratory: CTA bilaterally, no wheezing, no rhonchi Abdominal: Soft, NT, ND, bowel sounds + Extremities: no edema, no cyanosis  Discharge Instructions   Allergies as of 02/11/2021       Reactions   Ace Inhibitors Swelling   Amoxicillin Itching   Did it involve swelling of the face/tongue/throat, SOB, or low BP? Yes Did it involve sudden or severe rash/hives, skin peeling, or any reaction on the inside of your mouth or nose? No Did you need to seek medical attention at a hospital or doctor's office? No When did it last happen?      unknown  If all above answers are "NO", may proceed with cephalosporin use.;   Lisinopril Swelling   Lower lip swelling        Medication List     TAKE these medications    acetaminophen 500 MG tablet Commonly known as: TYLENOL Take 1,000 mg by mouth every 6 (six) hours as needed for moderate pain or headache.   amantadine 100 MG capsule Commonly known as: SYMMETREL Take 100 mg by mouth 2 (two) times daily.   amitriptyline 25 MG tablet Commonly known as: ELAVIL TAKE 1 TO 2 TABLETS(25 TO 50 MG) BY MOUTH AT BEDTIME What changed: See the new instructions.   amLODipine 5 MG tablet Commonly known as: NORVASC Take 1 tablet (5 mg total) by mouth daily. What changed: when to take this   amphetamine-dextroamphetamine 20 MG 24 hr capsule Commonly known as: Adderall XR Take 1 capsule (20 mg total) by mouth daily.   apixaban 5 MG Tabs tablet Commonly known as: ELIQUIS Take 1 tablet (5 mg total) by mouth 2 (two) times daily.   atorvastatin 10 MG tablet Commonly known as: LIPITOR Take 10 mg by mouth daily.   baclofen 20 MG tablet Commonly known as: LIORESAL Take 1 tablet (20  mg total) by mouth 4 (four) times daily as needed for muscle spasms.   busPIRone 15 MG tablet Commonly known as: BUSPAR Take 1 tablet (15 mg total) by mouth 2 (two) times daily. Must keep follow up 09/21/20 for ongoing refills What changed: additional instructions   citalopram 20 MG tablet Commonly known as: CeleXA Take 1 tablet (20 mg total) by mouth daily.   dalfampridine 10 MG Tb12 Take 1 tablet (10 mg total) by mouth 2 (two) times daily.   doxycycline 100 MG capsule Commonly known as: VIBRAMYCIN Take 1 capsule (100 mg total) by mouth 2 (two) times daily.   ferrous sulfate 324 MG Tbec Take 324 mg by mouth daily.   folic acid 1 MG tablet Commonly known as: FOLVITE Take 1 tablet (1 mg total) by mouth daily. Start taking on: February 12, 2021   gabapentin 800 MG tablet Commonly known  as: NEURONTIN Take 1 tablet (800 mg total) by mouth 4 (four) times daily.   metoprolol succinate 50 MG 24 hr tablet Commonly known as: TOPROL-XL Take 50 mg by mouth daily.   multivitamin with minerals Tabs tablet Take 1 tablet by mouth daily.   norethindrone 0.35 MG tablet Commonly known as: MICRONOR Take 1 tablet (0.35 mg total) by mouth daily.   omeprazole 40 MG capsule Commonly known as: PRILOSEC Take 40 mg by mouth daily.   SUMAtriptan 100 MG tablet Commonly known as: IMITREX TAKE 1 TABLET BY MOUTH 1 TIME FOR UP TO 1 DOSE AS NEEDED FOR MIGRAINE. MAY REPEAT IN 2 HOURS IF HEADACHE PERSISTS OR RECURS What changed:  how much to take how to take this when to take this reasons to take this additional instructions   tamsulosin 0.4 MG Caps capsule Commonly known as: FLOMAX TAKE 1 CAPSULE(0.4 MG) BY MOUTH DAILY What changed: See the new instructions.   tiZANidine 4 MG tablet Commonly known as: ZANAFLEX TAKE 1 TABLET BY MOUTH EVERY NIGHT AT BEDTIME   Zeposia 0.92 MG Caps Generic drug: Ozanimod HCl TAKE ONE CAPSULE (0.$RemoveBefore'92MG'RqKuRXGRQzXTP$ )  BY MOUTH ONE TIME DAILY What changed:  how much to  take how to take this when to take this additional instructions        Allergies  Allergen Reactions   Ace Inhibitors Swelling   Amoxicillin Itching    Did it involve swelling of the face/tongue/throat, SOB, or low BP? Yes Did it involve sudden or severe rash/hives, skin peeling, or any reaction on the inside of your mouth or nose? No Did you need to seek medical attention at a hospital or doctor's office? No When did it last happen?      unknown  If all above answers are "NO", may proceed with cephalosporin use.;   Lisinopril Swelling    Lower lip swelling    You were cared for by a hospitalist during your hospital stay. If you have any questions about your discharge medications or the care you received while you were in the hospital after you are discharged, you can call the unit and asked to speak with the hospitalist on call if the hospitalist that took care of you is not available. Once you are discharged, your primary care physician will handle any further medical issues. Please note that no refills for any discharge medications will be authorized once you are discharged, as it is imperative that you return to your primary care physician (or establish a relationship with a primary care physician if you do not have one) for your aftercare needs so that they can reassess your need for medications and monitor your lab values.   Procedures/Studies: MR LUMBAR SPINE WO CONTRAST  Result Date: 01/23/2021  [facility] [address], [place], [place] [address] [phone] NEUROIMAGING REPORT STUDY DATE: 01/23/2021 PATIENT NAME: Dawn Peters DOB: [birth year] MRN: [id number] EXAM: MRI of the lumbar spine without contrast ORDERING CLINICIAN: Richard A. Sater, MD. PhD CLINICAL HISTORY: 40 year old woman with multiple sclerosis, left foot drop and radicular pain COMPARISON FILMS: None TECHNIQUE: MRI of the lumbar spine was obtained utilizing 4 mm sagittal slices  from [id number] down to the lower sacrum with T1, T2 and inversion recovery views. In addition 4 mm axial slices from B0-1 down to L5-S1 level were included with T1 and T2 weighted views. CONTRAST: None IMAGING SITE: [facility], [address] [place], [place], [place] FINDINGS: On sagittal images, the spine is imaged from T11  to the sacrum.  The L5 vertebral body is partially sacralized and there is a small disc at L5-S1 (spine labeling correlating to CT scan from 05/19/2017 to identify the T12 vertebral body).  There is left hydronephrosis and hydroureter, similar in appearance to the thoracic spine MRI from 11/21/2020.  The conus medullaris and cauda equine appear normal.   The vertebral bodies are normally aligned.   Bone marrow is dark on both T1 and T2 weighted images..  The discs and interspaces were further evaluated on axial views from T11 to S1 as follows: T11-T12: The disc and interspace appear normal. T12-L1: The disc and interspace appear normal. L1-L2: The disc and interspace appear normal. L2-L3: There is mild spinal stenosis due to epidural lipomatosis and facet hypertrophy.  There is no minimal foraminal narrowing lateral recess stenosis but no nerve root compression. L3-L4: There is mild spinal stenosis due to disc protrusion, epidural lipomatosis and mild facet hypertrophy.  There is mild foraminal narrowing but no nerve root compression. L4-L5: There is disc bulging and epidural lipomatosis and facet hypertrophy with small joint effusions.  There is mild left foraminal narrowing but no nerve root compression. L5-S1: This level is partially sacralized.  There is no spinal stenosis or nerve root compression.   This MRI of the lumbar spine without contrast shows the following: 1.   Bone marrow changes consistent with anemia and renal disease 2.   Left hydronephrosis and hydroureter 3.   Mild spinal stenosis at L2-L3, and L3-L4 due to mild degenerative changes and epidural lipomatosis.  There is no  nerve root compression at these levels. 4.   Mild degenerative changes at L4-L5 that did not cause spinal stenosis or nerve root compression. INTERPRETING PHYSICIAN: Richard A. Felecia Shelling, MD, PhD, FAAN Certified in  Neuroimaging by [employer]     The results of significant diagnostics from this hospitalization (including imaging, microbiology, ancillary and laboratory) are listed below for reference.     Microbiology: Recent Results (from the past 240 hour(s))  Resp Panel by RT-PCR (Flu A&B, Covid) Nasopharyngeal Swab     Status: None   Collection Time: 02/08/21  6:09 PM   Specimen: Nasopharyngeal Swab; Nasopharyngeal(NP) swabs in vial transport medium  Result Value Ref Range Status   SARS Coronavirus 2 by RT PCR NEGATIVE NEGATIVE Final    Comment: (NOTE) SARS-CoV-2 target nucleic acids are NOT DETECTED.  The SARS-CoV-2 RNA is generally detectable in upper respiratory specimens during the acute phase of infection. The lowest concentration of SARS-CoV-2 viral copies this assay can detect is 138 copies/mL. A negative result does not preclude SARS-Cov-2 infection and should not be used as the sole basis for treatment or other patient management decisions. A negative result may occur with  improper specimen collection/handling, submission of specimen other than nasopharyngeal swab, presence of viral mutation(s) within the areas targeted by this assay, and inadequate number of viral copies(<138 copies/mL). A negative result must be combined with clinical observations, patient history, and epidemiological information. The expected result is Negative.  Fact Sheet for Patients:  EntrepreneurPulse.com.au  Fact Sheet for Healthcare Providers:  IncredibleEmployment.be  This test is no t yet approved or cleared by the [place] FDA and  has been authorized for detection and/or diagnosis of SARS-CoV-2 by FDA under an Emergency Use  Authorization (EUA). This EUA will remain  in effect (meaning this test can be used) for the duration of the COVID-19 declaration under Section 564(b)(1) of the Act, 21 U.S.C.section 360bbb-3(b)(1), unless  the authorization is terminated  or revoked sooner.       Influenza A by PCR NEGATIVE NEGATIVE Final   Influenza B by PCR NEGATIVE NEGATIVE Final    Comment: (NOTE) The Xpert Xpress SARS-CoV-2/FLU/RSV plus assay is intended as an aid in the diagnosis of influenza from Nasopharyngeal swab specimens and should not be used as a sole basis for treatment. Nasal washings and aspirates are unacceptable for Xpert Xpress SARS-CoV-2/FLU/RSV testing.  Fact Sheet for Patients: EntrepreneurPulse.com.au  Fact Sheet for Healthcare Providers: IncredibleEmployment.be  This test is not yet approved or cleared by the [place] FDA and has been authorized for detection and/or diagnosis of SARS-CoV-2 by FDA under an Emergency Use Authorization (EUA). This EUA will remain in effect (meaning this test can be used) for the duration of the COVID-19 declaration under Section 564(b)(1) of the Act, 21 U.S.C. section 360bbb-3(b)(1), unless the authorization is terminated or revoked.  Performed at [facility], [place] [address], [place], [place] [address]   MRSA Next Gen by PCR, Nasal     Status: None   Collection Time: 02/09/21  8:00 AM   Specimen: Nasal Mucosa; Nasal Swab  Result Value Ref Range Status   MRSA by PCR Next Gen NOT DETECTED NOT DETECTED Final    Comment: (NOTE) The GeneXpert MRSA Assay (FDA approved for NASAL specimens only), is one component of a comprehensive MRSA colonization surveillance program. It is not intended to diagnose MRSA infection nor to guide or monitor treatment for MRSA infections. Test performance is not FDA approved in patients less than 45 years old. Performed at [facility], [place]  [address], [place], [place] [address]      Labs: BNP (last 3 results) No results for input(s): BNP in the last 8760 hours. Basic Metabolic Panel: Recent Labs  Lab 02/08/21 2120 02/10/21 0235 02/11/21 0526  NA 141 140 138  K 3.8 3.7 3.7  CL 106 107 107  CO2 $Re'26 26 24  'VlE$ GLUCOSE 111* 108* 117*  BUN 22* 21* 19  CREATININE 1.44* 1.32* 1.17*  CALCIUM 9.0 8.5* 8.4*  MG  --  1.9 1.8   Liver Function Tests: Recent Labs  Lab 02/08/21 2120  AST 18  ALT 21  ALKPHOS 105  BILITOT 0.6  PROT 6.9  ALBUMIN 3.8   No results for input(s): LIPASE, AMYLASE in the last 168 hours. No results for input(s): AMMONIA in the last 168 hours. CBC: Recent Labs  Lab 02/08/21 2120 02/09/21 0523 02/09/21 2011 02/10/21 0235 02/11/21 0724  WBC 3.7* 3.1*  --  2.6* 2.7*  NEUTROABS 3.0  --   --   --   --   HGB 5.7* 4.5* 7.7* 7.8* 7.6*  HCT 17.2* 13.5* 21.5* 22.3* 21.9*  MCV 88.2 88.8  --  87.1 90.1  PLT 218 200  --  200 231   Cardiac Enzymes: No results for input(s): CKTOTAL, CKMB, CKMBINDEX, TROPONINI in the last 168 hours. BNP: Invalid input(s): POCBNP CBG: No results for input(s): GLUCAP in the last 168 hours. D-Dimer No results for input(s): DDIMER in the last 72 hours. Hgb A1c No results for input(s): HGBA1C in the last 72 hours. Lipid Profile No results for input(s): CHOL, HDL, LDLCALC, TRIG, CHOLHDL, LDLDIRECT in the last 72 hours. Thyroid function studies No results for input(s): TSH, T4TOTAL, T3FREE, THYROIDAB in the last 72 hours.  Invalid input(s): FREET3 Anemia work up Recent Labs    02/08/21 2120  VITAMINB12 204  FOLATE 5.3*  FERRITIN 903*  TIBC 173*  IRON 158  RETICCTPCT <0.4*   Urinalysis    Component Value Date/Time   COLORURINE YELLOW (A) 09/01/2019 0503   APPEARANCEUR CLOUDY (A) 09/01/2019 0503   APPEARANCEUR Clear 02/19/2019 1710   LABSPEC 1.024 09/01/2019 0503   PHURINE 5.0 09/01/2019 0503   GLUCOSEU NEGATIVE 09/01/2019 0503   HGBUR LARGE (A) 09/01/2019  0503   BILIRUBINUR NEGATIVE 09/01/2019 0503   BILIRUBINUR Negative 02/19/2019 1710   KETONESUR NEGATIVE 09/01/2019 0503   PROTEINUR 30 (A) 09/01/2019 0503   UROBILINOGEN 0.2 12/15/2016 1136   NITRITE NEGATIVE 09/01/2019 0503   LEUKOCYTESUR SMALL (A) 09/01/2019 0503   Sepsis Labs Invalid input(s): PROCALCITONIN,  WBC,  LACTICIDVEN Microbiology Recent Results (from the past 240 hour(s))  Resp Panel by RT-PCR (Flu A&B, Covid) Nasopharyngeal Swab     Status: None   Collection Time: 02/08/21  6:09 PM   Specimen: Nasopharyngeal Swab; Nasopharyngeal(NP) swabs in vial transport medium  Result Value Ref Range Status   SARS Coronavirus 2 by RT PCR NEGATIVE NEGATIVE Final    Comment: (NOTE) SARS-CoV-2 target nucleic acids are NOT DETECTED.  The SARS-CoV-2 RNA is generally detectable in upper respiratory specimens during the acute phase of infection. The lowest concentration of SARS-CoV-2 viral copies this assay can detect is 138 copies/mL. A negative result does not preclude SARS-Cov-2 infection and should not be used as the sole basis for treatment or other patient management decisions. A negative result may occur with  improper specimen collection/handling, submission of specimen other than nasopharyngeal swab, presence of viral mutation(s) within the areas targeted by this assay, and inadequate number of viral copies(<138 copies/mL). A negative result must be combined with clinical observations, patient history, and epidemiological information. The expected result is Negative.  Fact Sheet for Patients:  EntrepreneurPulse.com.au  Fact Sheet for Healthcare Providers:  IncredibleEmployment.be  This test is no t yet approved or cleared by the [place] FDA and  has been authorized for detection and/or diagnosis of SARS-CoV-2 by FDA under an Emergency Use Authorization (EUA). This EUA will remain  in effect (meaning this test can be used) for the  duration of the COVID-19 declaration under Section 564(b)(1) of the Act, 21 U.S.C.section 360bbb-3(b)(1), unless the authorization is terminated  or revoked sooner.       Influenza A by PCR NEGATIVE NEGATIVE Final   Influenza B by PCR NEGATIVE NEGATIVE Final    Comment: (NOTE) The Xpert Xpress SARS-CoV-2/FLU/RSV plus assay is intended as an aid in the diagnosis of influenza from Nasopharyngeal swab specimens and should not be used as a sole basis for treatment. Nasal washings and aspirates are unacceptable for Xpert Xpress SARS-CoV-2/FLU/RSV testing.  Fact Sheet for Patients: EntrepreneurPulse.com.au  Fact Sheet for Healthcare Providers: IncredibleEmployment.be  This test is not yet approved or cleared by the [place] FDA and has been authorized for detection and/or diagnosis of SARS-CoV-2 by FDA under an Emergency Use Authorization (EUA). This EUA will remain in effect (meaning this test can be used) for the duration of the COVID-19 declaration under Section 564(b)(1) of the Act, 21 U.S.C. section 360bbb-3(b)(1), unless the authorization is terminated or revoked.  Performed at [facility], [place] [address], [place], [place] [address]   MRSA Next Gen by PCR, Nasal     Status: None   Collection Time: 02/09/21  8:00 AM   Specimen: Nasal Mucosa; Nasal Swab  Result Value Ref Range Status   MRSA by PCR Next Gen NOT DETECTED NOT DETECTED  Final    Comment: (NOTE) The GeneXpert MRSA Assay (FDA approved for NASAL specimens only), is one component of a comprehensive MRSA colonization surveillance program. It is not intended to diagnose MRSA infection nor to guide or monitor treatment for MRSA infections. Test performance is not FDA approved in patients less than 42 years old. Performed at [facility], [place] [address] [place], [place] [address]      Time coordinating discharge:  I have spent 35  minutes face to face with the patient and on the ward discussing the patients care, assessment, plan and disposition with other care givers. >50% of the time was devoted counseling the patient about the risks and benefits of treatment/Discharge disposition and coordinating care.   SIGNED:   Damita Lack, MD  [facility] 02/11/2021, 1:25 PM   If 7PM-7AM, please contact night-coverage

## 2021-02-11 NOTE — Plan of Care (Signed)
  Problem: Education: Goal: Knowledge of General Education information will improve Description: Including pain rating scale, medication(s)/side effects and non-pharmacologic comfort measures Outcome: Progressing   Problem: Health Behavior/Discharge Planning: Goal: Ability to manage health-related needs will improve Outcome: Progressing   Problem: Clinical Measurements: Goal: Ability to maintain clinical measurements within normal limits will improve Outcome: Progressing Goal: Will remain free from infection Outcome: Progressing Goal: Diagnostic test results will improve Outcome: Progressing Goal: Cardiovascular complication will be avoided Outcome: Progressing   Problem: Activity: Goal: Risk for activity intolerance will decrease Outcome: Progressing   Problem: Nutrition: Goal: Adequate nutrition will be maintained Outcome: Progressing   Problem: Pain Managment: Goal: General experience of comfort will improve Outcome: Progressing   Problem: Safety: Goal: Ability to remain free from injury will improve Outcome: Progressing   Problem: Skin Integrity: Goal: Risk for impaired skin integrity will decrease Outcome: Progressing

## 2021-02-11 NOTE — Telephone Encounter (Signed)
PA approved until 03/06/22. Reference #: [id number]

## 2021-02-11 NOTE — Care Management Important Message (Signed)
Important Message  Patient Details IM Letter given to the Patient. Name: Olimpia Tinch MRN: [id number] Date of Birth: 06/23/1980   Medicare Important Message Given:  Yes     Kerin Salen 02/11/2021, 10:44 AM

## 2021-02-11 NOTE — Telephone Encounter (Signed)
Called back and spoke w/ Judson Roch. She initiated new PA case: [id number]. Asked we fax chart notes/MRI brain to [phone] for them to attach to PA. They will then review request. I faxed requested supporting documentation, received fax confirmation. Waiting on determination.

## 2021-02-11 NOTE — Progress Notes (Signed)
LPN inquired about repeat hemoglobin per patient request. No new orders.

## 2021-02-11 NOTE — Telephone Encounter (Signed)
[facility] PA Department Freda Munro) needing PA information. Have some additional clinical questions. Would like a call from the nurse  Contact info: [phone]

## 2021-02-12 ENCOUNTER — Inpatient Hospital Stay: Payer: Medicare Other

## 2021-02-12 ENCOUNTER — Inpatient Hospital Stay: Payer: Medicare Other | Attending: Physician Assistant

## 2021-02-12 ENCOUNTER — Other Ambulatory Visit: Payer: Self-pay

## 2021-02-12 VITALS — BP 120/75 | HR 89 | Temp 98.0°F | Resp 18

## 2021-02-12 DIAGNOSIS — D649 Anemia, unspecified: Secondary | ICD-10-CM

## 2021-02-12 DIAGNOSIS — D631 Anemia in chronic kidney disease: Secondary | ICD-10-CM | POA: Diagnosis present

## 2021-02-12 DIAGNOSIS — N183 Chronic kidney disease, stage 3 unspecified: Secondary | ICD-10-CM | POA: Insufficient documentation

## 2021-02-12 LAB — SAMPLE TO BLOOD BANK

## 2021-02-12 LAB — CBC WITH DIFFERENTIAL (CANCER CENTER ONLY)
Abs Immature Granulocytes: 0.17 10*3/uL — ABNORMAL HIGH (ref 0.00–0.07)
Basophils Absolute: 0 10*3/uL (ref 0.0–0.1)
Basophils Relative: 0 %
Eosinophils Absolute: 0.1 10*3/uL (ref 0.0–0.5)
Eosinophils Relative: 4 %
HCT: 21.9 % — ABNORMAL LOW (ref 36.0–46.0)
Hemoglobin: 7.9 g/dL — ABNORMAL LOW (ref 12.0–15.0)
Immature Granulocytes: 5 %
Lymphocytes Relative: 6 %
Lymphs Abs: 0.2 10*3/uL — ABNORMAL LOW (ref 0.7–4.0)
MCH: 31.2 pg (ref 26.0–34.0)
MCHC: 36.1 g/dL — ABNORMAL HIGH (ref 30.0–36.0)
MCV: 86.6 fL (ref 80.0–100.0)
Monocytes Absolute: 0.4 10*3/uL (ref 0.1–1.0)
Monocytes Relative: 11 %
Neutro Abs: 2.5 10*3/uL (ref 1.7–7.7)
Neutrophils Relative %: 74 %
Platelet Count: 222 10*3/uL (ref 150–400)
RBC: 2.53 MIL/uL — ABNORMAL LOW (ref 3.87–5.11)
RDW: 13.8 % (ref 11.5–15.5)
WBC Count: 3.3 10*3/uL — ABNORMAL LOW (ref 4.0–10.5)
nRBC: 0 % (ref 0.0–0.2)

## 2021-02-12 MED ORDER — EPOETIN ALFA-EPBX 10000 UNIT/ML IJ SOLN
10000.0000 [IU] | Freq: Once | INTRAMUSCULAR | Status: AC
  Administered 2021-02-12: 10000 [IU] via SUBCUTANEOUS
  Filled 2021-02-12: qty 1

## 2021-02-23 ENCOUNTER — Telehealth: Payer: Self-pay | Admitting: *Deleted

## 2021-02-23 ENCOUNTER — Telehealth: Payer: Self-pay

## 2021-02-23 ENCOUNTER — Other Ambulatory Visit: Payer: Self-pay

## 2021-02-23 ENCOUNTER — Inpatient Hospital Stay: Payer: Medicare Other

## 2021-02-23 ENCOUNTER — Inpatient Hospital Stay (HOSPITAL_COMMUNITY)
Admission: EM | Admit: 2021-02-23 | Discharge: 2021-02-26 | DRG: 841 | Disposition: A | Discharge status: Home / Self Care | Payer: Medicare Other | Attending: Internal Medicine | Admitting: Internal Medicine

## 2021-02-23 DIAGNOSIS — G35 Multiple sclerosis: Secondary | ICD-10-CM | POA: Diagnosis present

## 2021-02-23 DIAGNOSIS — R0602 Shortness of breath: Secondary | ICD-10-CM

## 2021-02-23 DIAGNOSIS — E785 Hyperlipidemia, unspecified: Secondary | ICD-10-CM | POA: Diagnosis present

## 2021-02-23 DIAGNOSIS — D630 Anemia in neoplastic disease: Secondary | ICD-10-CM | POA: Diagnosis present

## 2021-02-23 DIAGNOSIS — F32A Depression, unspecified: Secondary | ICD-10-CM | POA: Diagnosis present

## 2021-02-23 DIAGNOSIS — Z6832 Body mass index (BMI) 32.0-32.9, adult: Secondary | ICD-10-CM

## 2021-02-23 DIAGNOSIS — D649 Anemia, unspecified: Secondary | ICD-10-CM

## 2021-02-23 DIAGNOSIS — Z8614 Personal history of Methicillin resistant Staphylococcus aureus infection: Secondary | ICD-10-CM

## 2021-02-23 DIAGNOSIS — Z803 Family history of malignant neoplasm of breast: Secondary | ICD-10-CM

## 2021-02-23 DIAGNOSIS — Z86718 Personal history of other venous thrombosis and embolism: Secondary | ICD-10-CM

## 2021-02-23 DIAGNOSIS — N1832 Chronic kidney disease, stage 3b: Secondary | ICD-10-CM | POA: Diagnosis present

## 2021-02-23 DIAGNOSIS — Z800 Family history of malignant neoplasm of digestive organs: Secondary | ICD-10-CM

## 2021-02-23 DIAGNOSIS — E669 Obesity, unspecified: Secondary | ICD-10-CM | POA: Diagnosis present

## 2021-02-23 DIAGNOSIS — D471 Chronic myeloproliferative disease: Secondary | ICD-10-CM | POA: Diagnosis not present

## 2021-02-23 DIAGNOSIS — Z96641 Presence of right artificial hip joint: Secondary | ICD-10-CM | POA: Diagnosis present

## 2021-02-23 DIAGNOSIS — I10 Essential (primary) hypertension: Secondary | ICD-10-CM | POA: Diagnosis present

## 2021-02-23 DIAGNOSIS — N179 Acute kidney failure, unspecified: Secondary | ICD-10-CM | POA: Diagnosis present

## 2021-02-23 DIAGNOSIS — I129 Hypertensive chronic kidney disease with stage 1 through stage 4 chronic kidney disease, or unspecified chronic kidney disease: Secondary | ICD-10-CM | POA: Diagnosis present

## 2021-02-23 DIAGNOSIS — Z8249 Family history of ischemic heart disease and other diseases of the circulatory system: Secondary | ICD-10-CM

## 2021-02-23 DIAGNOSIS — Z7901 Long term (current) use of anticoagulants: Secondary | ICD-10-CM

## 2021-02-23 DIAGNOSIS — I2699 Other pulmonary embolism without acute cor pulmonale: Secondary | ICD-10-CM | POA: Diagnosis present

## 2021-02-23 DIAGNOSIS — D619 Aplastic anemia, unspecified: Secondary | ICD-10-CM | POA: Diagnosis present

## 2021-02-23 DIAGNOSIS — Z86711 Personal history of pulmonary embolism: Secondary | ICD-10-CM

## 2021-02-23 DIAGNOSIS — K219 Gastro-esophageal reflux disease without esophagitis: Secondary | ICD-10-CM | POA: Diagnosis present

## 2021-02-23 DIAGNOSIS — Z833 Family history of diabetes mellitus: Secondary | ICD-10-CM

## 2021-02-23 DIAGNOSIS — Z20822 Contact with and (suspected) exposure to covid-19: Secondary | ICD-10-CM | POA: Diagnosis present

## 2021-02-23 DIAGNOSIS — N319 Neuromuscular dysfunction of bladder, unspecified: Secondary | ICD-10-CM | POA: Diagnosis present

## 2021-02-23 LAB — CBC WITH DIFFERENTIAL (CANCER CENTER ONLY)
Abs Immature Granulocytes: 0.16 10*3/uL — ABNORMAL HIGH (ref 0.00–0.07)
Basophils Absolute: 0 10*3/uL (ref 0.0–0.1)
Basophils Relative: 0 %
Eosinophils Absolute: 0 10*3/uL (ref 0.0–0.5)
Eosinophils Relative: 1 %
HCT: 19.4 % — ABNORMAL LOW (ref 36.0–46.0)
Hemoglobin: 6.6 g/dL — CL (ref 12.0–15.0)
Immature Granulocytes: 4 %
Lymphocytes Relative: 5 %
Lymphs Abs: 0.2 10*3/uL — ABNORMAL LOW (ref 0.7–4.0)
MCH: 30.3 pg (ref 26.0–34.0)
MCHC: 34 g/dL (ref 30.0–36.0)
MCV: 89 fL (ref 80.0–100.0)
Monocytes Absolute: 0.7 10*3/uL (ref 0.1–1.0)
Monocytes Relative: 19 %
Neutro Abs: 2.8 10*3/uL (ref 1.7–7.7)
Neutrophils Relative %: 71 %
Platelet Count: 256 10*3/uL (ref 150–400)
RBC: 2.18 MIL/uL — ABNORMAL LOW (ref 3.87–5.11)
RDW: 13.5 % (ref 11.5–15.5)
WBC Count: 3.8 10*3/uL — ABNORMAL LOW (ref 4.0–10.5)
nRBC: 0 % (ref 0.0–0.2)

## 2021-02-23 LAB — SAMPLE TO BLOOD BANK

## 2021-02-23 NOTE — Telephone Encounter (Signed)
CRITICAL VALUE STICKER  CRITICAL VALUE: Hgb 6.6  RECEIVER (on-site recipient of call): Ansyi CMA  DATE & TIME NOTIFIED: 02/23/21 @ 1230  MESSENGER (representative from lab): Rosann Auerbach  MD NOTIFIED: Dr. Alen Blew  TIME OF NOTIFICATION: 1517  RESPONSE:  Pt to receive 2 units PRBC's

## 2021-02-23 NOTE — ED Triage Notes (Signed)
Pt reports with dizziness x 3 days. She states that her hemoglobin was low per her providers office.

## 2021-02-23 NOTE — Telephone Encounter (Signed)
CRITICAL VALUE STICKER  CRITICAL VALUE: Hgb = 6.6  RECEIVER (on-site recipient of call): Yetta Glassman, CMA  DATE & TIME NOTIFIED: 02/23/21 at 12:30p  MESSENGER (representative from lab): Rosann Auerbach  MD NOTIFIED: Alen Blew  TIME OF NOTIFICATION: 02/23/21 at 12:32pm  RESPONSE: Notification given to Bethena Roys, RN for follow-up with pt and provider.

## 2021-02-24 ENCOUNTER — Encounter (HOSPITAL_COMMUNITY): Payer: Self-pay

## 2021-02-24 ENCOUNTER — Emergency Department (HOSPITAL_COMMUNITY): Payer: Medicare Other

## 2021-02-24 DIAGNOSIS — I10 Essential (primary) hypertension: Secondary | ICD-10-CM | POA: Diagnosis not present

## 2021-02-24 DIAGNOSIS — I2699 Other pulmonary embolism without acute cor pulmonale: Secondary | ICD-10-CM

## 2021-02-24 DIAGNOSIS — D619 Aplastic anemia, unspecified: Secondary | ICD-10-CM

## 2021-02-24 DIAGNOSIS — E785 Hyperlipidemia, unspecified: Secondary | ICD-10-CM

## 2021-02-24 DIAGNOSIS — D649 Anemia, unspecified: Secondary | ICD-10-CM | POA: Diagnosis not present

## 2021-02-24 DIAGNOSIS — N179 Acute kidney failure, unspecified: Secondary | ICD-10-CM | POA: Diagnosis not present

## 2021-02-24 LAB — COMPREHENSIVE METABOLIC PANEL
ALT: 25 U/L (ref 0–44)
ALT: 24 U/L (ref 0–44)
AST: 20 U/L (ref 15–41)
AST: 19 U/L (ref 15–41)
Albumin: 4 g/dL (ref 3.5–5.0)
Albumin: 3.7 g/dL (ref 3.5–5.0)
Alkaline Phosphatase: 112 U/L (ref 38–126)
Alkaline Phosphatase: 104 U/L (ref 38–126)
Anion gap: 6 (ref 5–15)
Anion gap: 7 (ref 5–15)
BUN: 19 mg/dL (ref 6–20)
BUN: 18 mg/dL (ref 6–20)
CO2: 27 mmol/L (ref 22–32)
CO2: 26 mmol/L (ref 22–32)
Calcium: 8.6 mg/dL — ABNORMAL LOW (ref 8.9–10.3)
Calcium: 8.4 mg/dL — ABNORMAL LOW (ref 8.9–10.3)
Chloride: 106 mmol/L (ref 98–111)
Chloride: 106 mmol/L (ref 98–111)
Creatinine, Ser: 1.45 mg/dL — ABNORMAL HIGH (ref 0.44–1.00)
Creatinine, Ser: 1.44 mg/dL — ABNORMAL HIGH (ref 0.44–1.00)
GFR, Estimated: 46 mL/min — ABNORMAL LOW (ref >60)
GFR, Estimated: 47 mL/min — ABNORMAL LOW (ref >60)
Glucose, Bld: 100 mg/dL — ABNORMAL HIGH (ref 70–99)
Glucose, Bld: 100 mg/dL — ABNORMAL HIGH (ref 70–99)
Potassium: 3.5 mmol/L (ref 3.5–5.1)
Potassium: 3.5 mmol/L (ref 3.5–5.1)
Sodium: 139 mmol/L (ref 135–145)
Sodium: 139 mmol/L (ref 135–145)
Total Bilirubin: 0.7 mg/dL (ref 0.3–1.2)
Total Bilirubin: 0.6 mg/dL (ref 0.3–1.2)
Total Protein: 7.3 g/dL (ref 6.5–8.1)
Total Protein: 6.6 g/dL (ref 6.5–8.1)

## 2021-02-24 LAB — CBC WITH DIFFERENTIAL/PLATELET
Abs Immature Granulocytes: 0.07 10*3/uL (ref 0.00–0.07)
Abs Immature Granulocytes: 0.07 10*3/uL (ref 0.00–0.07)
Basophils Absolute: 0 10*3/uL (ref 0.0–0.1)
Basophils Absolute: 0 10*3/uL (ref 0.0–0.1)
Basophils Relative: 0 %
Basophils Relative: 0 %
Eosinophils Absolute: 0 10*3/uL (ref 0.0–0.5)
Eosinophils Absolute: 0.1 10*3/uL (ref 0.0–0.5)
Eosinophils Relative: 1 %
Eosinophils Relative: 2 %
HCT: 19.5 % — ABNORMAL LOW (ref 36.0–46.0)
HCT: 18.7 % — ABNORMAL LOW (ref 36.0–46.0)
Hemoglobin: 6.6 g/dL — CL (ref 12.0–15.0)
Hemoglobin: 6.2 g/dL — CL (ref 12.0–15.0)
Immature Granulocytes: 2 %
Immature Granulocytes: 2 %
Lymphocytes Relative: 6 %
Lymphocytes Relative: 8 %
Lymphs Abs: 0.2 10*3/uL — ABNORMAL LOW (ref 0.7–4.0)
Lymphs Abs: 0.3 10*3/uL — ABNORMAL LOW (ref 0.7–4.0)
MCH: 30.6 pg (ref 26.0–34.0)
MCH: 30.1 pg (ref 26.0–34.0)
MCHC: 33.8 g/dL (ref 30.0–36.0)
MCHC: 33.2 g/dL (ref 30.0–36.0)
MCV: 90.3 fL (ref 80.0–100.0)
MCV: 90.8 fL (ref 80.0–100.0)
Monocytes Absolute: 0.6 10*3/uL (ref 0.1–1.0)
Monocytes Absolute: 0.6 10*3/uL (ref 0.1–1.0)
Monocytes Relative: 17 %
Monocytes Relative: 18 %
Neutro Abs: 2.5 10*3/uL (ref 1.7–7.7)
Neutro Abs: 2.2 10*3/uL (ref 1.7–7.7)
Neutrophils Relative %: 74 %
Neutrophils Relative %: 70 %
Platelets: 267 10*3/uL (ref 150–400)
Platelets: 247 10*3/uL (ref 150–400)
RBC: 2.16 MIL/uL — ABNORMAL LOW (ref 3.87–5.11)
RBC: 2.06 MIL/uL — ABNORMAL LOW (ref 3.87–5.11)
RDW: 13.7 % (ref 11.5–15.5)
RDW: 13.6 % (ref 11.5–15.5)
WBC: 3.4 10*3/uL — ABNORMAL LOW (ref 4.0–10.5)
WBC: 3.1 10*3/uL — ABNORMAL LOW (ref 4.0–10.5)
nRBC: 0 % (ref 0.0–0.2)
nRBC: 0 % (ref 0.0–0.2)

## 2021-02-24 LAB — RESP PANEL BY RT-PCR (FLU A&B, COVID) ARPGX2
Influenza A by PCR: NEGATIVE
Influenza B by PCR: NEGATIVE
SARS Coronavirus 2 by RT PCR: NEGATIVE

## 2021-02-24 LAB — CREATININE, URINE, RANDOM: Creatinine, Urine: 263.71 mg/dL

## 2021-02-24 LAB — URINALYSIS, COMPLETE (UACMP) WITH MICROSCOPIC
Bilirubin Urine: NEGATIVE
Glucose, UA: NEGATIVE mg/dL
Hgb urine dipstick: NEGATIVE
Ketones, ur: 5 mg/dL — AB
Leukocytes,Ua: NEGATIVE
Nitrite: NEGATIVE
Protein, ur: NEGATIVE mg/dL
Specific Gravity, Urine: 1.024 (ref 1.005–1.030)
pH: 5 (ref 5.0–8.0)

## 2021-02-24 LAB — PROTIME-INR
INR: 1 (ref 0.8–1.2)
Prothrombin Time: 13 seconds (ref 11.4–15.2)

## 2021-02-24 LAB — RETICULOCYTES
Immature Retic Fract: 6.2 % (ref 2.3–15.9)
RBC.: 2.19 MIL/uL — ABNORMAL LOW (ref 3.87–5.11)
Retic Count, Absolute: 9.2 10*3/uL — ABNORMAL LOW (ref 19.0–186.0)
Retic Ct Pct: 0.4 % (ref 0.4–3.1)

## 2021-02-24 LAB — SODIUM, URINE, RANDOM: Sodium, Ur: 102 mmol/L

## 2021-02-24 LAB — MAGNESIUM: Magnesium: 2.2 mg/dL (ref 1.7–2.4)

## 2021-02-24 LAB — PREPARE RBC (CROSSMATCH)

## 2021-02-24 MED ORDER — AMANTADINE HCL 100 MG PO CAPS
100.0000 mg | ORAL_CAPSULE | Freq: Two times a day (BID) | ORAL | Status: DC
  Administered 2021-02-24 – 2021-02-26 (×5): 100 mg via ORAL
  Filled 2021-02-24 (×5): qty 1

## 2021-02-24 MED ORDER — BACLOFEN 20 MG PO TABS
20.0000 mg | ORAL_TABLET | Freq: Four times a day (QID) | ORAL | Status: DC | PRN
  Administered 2021-02-24: 19:00:00 20 mg via ORAL
  Filled 2021-02-24: qty 2

## 2021-02-24 MED ORDER — LACTATED RINGERS IV BOLUS: 1000.0000 mL | Freq: Once | INTRAVENOUS | Status: DC

## 2021-02-24 MED ORDER — ACETAMINOPHEN 650 MG RE SUPP: 650.0000 mg | Freq: Four times a day (QID) | RECTAL | Status: DC | PRN

## 2021-02-24 MED ORDER — OZANIMOD HCL 0.92 MG PO CAPS: 0.9200 mg | ORAL_CAPSULE | Freq: Every day | ORAL | Status: DC

## 2021-02-24 MED ORDER — ATORVASTATIN CALCIUM 10 MG PO TABS
10.0000 mg | ORAL_TABLET | Freq: Every day | ORAL | Status: DC
  Administered 2021-02-24 – 2021-02-26 (×3): 10 mg via ORAL
  Filled 2021-02-24 (×3): qty 1

## 2021-02-24 MED ORDER — ACETAMINOPHEN 325 MG PO TABS
650.0000 mg | ORAL_TABLET | Freq: Four times a day (QID) | ORAL | Status: DC | PRN
  Administered 2021-02-24 – 2021-02-26 (×3): 650 mg via ORAL
  Filled 2021-02-24 (×4): qty 2

## 2021-02-24 MED ORDER — APIXABAN 5 MG PO TABS: 5.0000 mg | ORAL_TABLET | Freq: Two times a day (BID) | ORAL | Status: DC

## 2021-02-24 MED ORDER — SODIUM CHLORIDE 0.9 % IV SOLN
10.0000 mL/h | Freq: Once | INTRAVENOUS | Status: AC
  Administered 2021-02-24: 15:00:00 10 mL/h via INTRAVENOUS

## 2021-02-24 MED ORDER — NORETHINDRONE 0.35 MG PO TABS: 1.0000 | ORAL_TABLET | Freq: Every day | ORAL | Status: DC

## 2021-02-24 MED ORDER — CITALOPRAM HYDROBROMIDE 20 MG PO TABS
20.0000 mg | ORAL_TABLET | Freq: Every day | ORAL | Status: DC
  Administered 2021-02-24 – 2021-02-26 (×3): 20 mg via ORAL
  Filled 2021-02-24: qty 1
  Filled 2021-02-24: qty 2
  Filled 2021-02-24: qty 1

## 2021-02-24 MED ORDER — FOLIC ACID 1 MG PO TABS
1.0000 mg | ORAL_TABLET | Freq: Every day | ORAL | Status: DC
  Administered 2021-02-24 – 2021-02-26 (×3): 1 mg via ORAL
  Filled 2021-02-24 (×3): qty 1

## 2021-02-24 MED ORDER — BUSPIRONE HCL 5 MG PO TABS
15.0000 mg | ORAL_TABLET | Freq: Two times a day (BID) | ORAL | Status: DC
  Administered 2021-02-24 – 2021-02-26 (×5): 15 mg via ORAL
  Filled 2021-02-24: qty 2
  Filled 2021-02-24 (×3): qty 1
  Filled 2021-02-24: qty 2

## 2021-02-24 MED ORDER — PANTOPRAZOLE SODIUM 40 MG PO TBEC
40.0000 mg | DELAYED_RELEASE_TABLET | Freq: Every day | ORAL | Status: DC
  Administered 2021-02-24 – 2021-02-26 (×3): 40 mg via ORAL
  Filled 2021-02-24 (×3): qty 1

## 2021-02-24 MED ORDER — AMITRIPTYLINE HCL 25 MG PO TABS
50.0000 mg | ORAL_TABLET | Freq: Every day | ORAL | Status: DC
  Administered 2021-02-24 – 2021-02-25 (×2): 50 mg via ORAL
  Filled 2021-02-24 (×3): qty 2

## 2021-02-24 MED ORDER — DIPHENHYDRAMINE HCL 25 MG PO CAPS: 25.0000 mg | ORAL_CAPSULE | Freq: Once | ORAL | Status: DC | PRN

## 2021-02-24 MED ORDER — ALPRAZOLAM 1 MG PO TABS
1.0000 mg | ORAL_TABLET | Freq: Three times a day (TID) | ORAL | Status: DC | PRN
  Administered 2021-02-24: 19:00:00 1 mg via ORAL
  Filled 2021-02-24: qty 2

## 2021-02-24 NOTE — H&P (Signed)
History and Physical    PLEASE NOTE THAT DRAGON DICTATION SOFTWARE WAS USED IN THE CONSTRUCTION OF THIS NOTE.   Dawn Peters VWU:[id number] DOB: 1980/04/27 DOA: 02/23/2021  PCP: Carylon Perches, NP  Patient coming from: home   I have personally briefly reviewed patient's old medical records in [place]  Chief Complaint: Dizziness  HPI: Dawn Peters is a 40 y.o. 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   The patient reports 1 to 2 days of dizziness/lightheadedness, in the absence of any syncope or falls.  Over this timeframe she also notes dyspnea on exertion, which is not her baseline, but occurs along with dizziness/lightheadedness at times of exacerbation of her chronic anemia, with documented history of hypoproliferative anemia for which she follows with Dr.Shadad Of heme-onc.   Denies any shortness of breath at rest and also denies any orthopnea, PND, or worsening peripheral edema.  Not associated with any chest pain, palpitations, diaphoresis.  No recent trauma.  Has not noted any melena or hematochezia.  No nausea, vomiting, diarrhea, abdominal pain.  Baseline hemoglobin appears to be in the range of 7-8.  She has a history of requiring recurrent PRBC transfusion and has also undergone prior Epogen injections.  She was previously hospitalized from 02/08/2021 -02/09/2021 for chronic anemia with similar presenting constellation of symptoms.  Most recent prior hemoglobin noted to be 7.9 on 02/12/2021.     ED Course:  Vital signs in the  ED were notable for the following: Afebrile; heart rate 77-87; blood pressure 105/67 -112/71; respiratory rate 17-20, oxygen saturation 98 200% on room air.  Labs were notable for the following: CMP notable for the following: BUN 19, unchanged from most recent prior when checked on 02/11/2021, creatinine 1.45 relative to most recent prior 1.17 on 02/11/2021, and liver enzymes were found to be within normal limits.  CBC notable for the following: White blood cell count 3400 compared to 3300 on 02/12/2021, hemoglobin 6.6, platelet count 267.  COVID-19/flu PCR results are currently pending.  Imaging and additional notable ED work-up: EKG shows sinus rhythm with heart rate 84, normal intervals, no evidence of T wave or ST changes.  Orders were placed to type cross and transfuse 2 units PRBC.  Patient has a history of antibodies, and PRBC being transported from [place].   Subsequently, the patient is being admitted for overnight observation to the PCU for further evaluation management chronic anemia.     Review of Systems: As per HPI otherwise 10 point review of systems negative.   Past Medical History:  Diagnosis Date   Anticoagulant long-term use    Eliquis   Anxiety    Chronic pain    CKD (chronic kidney disease), stage III ([place])    Depression    DVT, lower extremity, recurrent ([place]) 09/17/2017   left lower extremity-- treated with eliquis   Gait disturbance    due to MS   GERD (gastroesophageal reflux disease)    watch diet   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/p  THA   History of DVT of lower extremity 2007   History  of encephalopathy 19/1478   acute metabolic encephalopathy secondary to MS,  resolved   History of MRSA infection 2004   right hip infection post THA   History of pulmonary embolus (PE) 2005   Hydronephrosis, left    w/ acute kidney injury 02/ 2019   Hypertension    Left-sided weakness    due to MS    Migraines    MS (multiple sclerosis) [facility]) neurologist-  dr Renne Musca   dx 2000--   Muscle spasticity    Neurogenic bladder    due to MS   Pulmonary embolism, bilateral ([place]) 09/17/2017   treated w/ eliquis   Strains to urinate    Urgency of urination     Past Surgical History:  Procedure Laterality Date   CYSTOSCOPY WITH RETROGRADE PYELOGRAM, URETEROSCOPY AND STENT PLACEMENT Bilateral 11/29/2017   Procedure: BILATERAL  RETROGRADE PYELOGRAM, LEFT DIAGNOSIC URETEROSCOPY AND STENT LEFT PLACEMENT;  Surgeon: Ardis Hughs, MD;  Location: [facility];  Service: Urology;  Laterality: Bilateral;   HIP ARTHROSCOPY Left 07-05-2013   dr pill @[facility]    iliopsosas release, synovectomy   REVISION TOTAL HIP ARTHROPLASTY Right 03-28-2003    dr Alvan Dame @[facility]    TOTAL HIP ARTHROPLASTY Bilateral left 11-05-2002  dr Alvan Dame @[facility] ;   right 06/ 2004  @[facility]     Social History:  reports that she has never smoked. She has never used smokeless tobacco. She reports that she does not drink alcohol and does not use drugs.   Allergies  Allergen Reactions   Ace Inhibitors Swelling   Amoxicillin Itching    Did it involve swelling of the face/tongue/throat, SOB, or low BP? Yes Did it involve sudden or severe rash/hives, skin peeling, or any reaction on the inside of your mouth or nose? No Did you need to seek medical attention at a hospital or doctor's office? No When did it last happen?      unknown  If all above answers are NO, may proceed with cephalosporin use.;   Lisinopril Swelling    Lower lip swelling    Family History  Problem Relation Age of Onset   Healthy Mother    Healthy Father    Breast cancer Other        paternal grandmother dx age 52   Colon cancer Other        paternal grandmother   Hypertension Other    Diabetes Other     Family history reviewed and not pertinent    Prior to Admission medications   Medication Sig Start Date End Date Taking? Authorizing Provider   acetaminophen (TYLENOL) 500 MG tablet Take 1,000 mg by mouth every 6 (six) hours as needed for moderate pain or headache.    [provider]  amantadine (SYMMETREL) 100 MG capsule Take 100 mg by mouth 2 (two) times daily. 09/27/20   [provider]  amitriptyline (ELAVIL) 25 MG tablet TAKE 1 TO 2 TABLETS(25 TO 50 MG) BY MOUTH AT BEDTIME Patient taking differently: Take 50 mg by mouth at bedtime. 11/17/20   Sater, Nanine Means, MD  amLODipine (NORVASC) 5 MG tablet Take 1 tablet (5 mg total) by mouth daily. Patient taking differently: Take 5 mg by mouth daily after breakfast. 05/21/17   Arrien, Jimmy Picket, MD  amphetamine-dextroamphetamine (ADDERALL XR) 20 MG 24 hr capsule Take 1 capsule (20 mg total) by mouth daily. 02/04/21   Sater, Nanine Means, MD  apixaban (ELIQUIS) 5 MG TABS tablet Take 1 tablet (5 mg total) by mouth 2 (  two) times daily. 09/21/20   Sater, Nanine Means, MD  atorvastatin (LIPITOR) 10 MG tablet Take 10 mg by mouth daily. 12/24/20   [provider]  baclofen (LIORESAL) 20 MG tablet Take 1 tablet (20 mg total) by mouth 4 (four) times daily as needed for muscle spasms. 09/16/19   Sater, Nanine Means, MD  busPIRone (BUSPAR) 15 MG tablet Take 1 tablet (15 mg total) by mouth 2 (two) times daily. Must keep follow up 09/21/20 for ongoing refills Patient taking differently: Take 15 mg by mouth 2 (two) times daily. 08/18/20   Sater, Nanine Means, MD  citalopram (CELEXA) 20 MG tablet Take 1 tablet (20 mg total) by mouth daily. 02/19/19   Sater, Nanine Means, MD  dalfampridine 10 MG TB12 Take 1 tablet (10 mg total) by mouth 2 (two) times daily. 09/21/20   Sater, Nanine Means, MD  doxycycline (VIBRAMYCIN) 100 MG capsule Take 1 capsule (100 mg total) by mouth 2 (two) times daily. Patient not taking: Reported on 02/09/2021 12/26/20   Hendricks Limes, PA-C  ferrous sulfate 324 MG TBEC Take 324 mg by mouth daily.    [provider]  folic acid (FOLVITE) 1 MG tablet Take 1 tablet (1  mg total) by mouth daily. 02/12/21 03/14/21  Amin, Jeanella Flattery, MD  gabapentin (NEURONTIN) 800 MG tablet Take 1 tablet (800 mg total) by mouth 4 (four) times daily. 09/21/20   Sater, Nanine Means, MD  metoprolol succinate (TOPROL-XL) 50 MG 24 hr tablet Take 50 mg by mouth daily. 09/27/20   [provider]  Multiple Vitamin (MULTIVITAMIN WITH MINERALS) TABS tablet Take 1 tablet by mouth daily. 09/20/17   Kayleen Memos, DO  norethindrone (MICRONOR) 0.35 MG tablet Take 1 tablet (0.35 mg total) by mouth daily. 11/19/18   Clarnce Flock, MD  omeprazole (PRILOSEC) 40 MG capsule Take 40 mg by mouth daily. 09/27/20   [provider]  Ozanimod HCl (ZEPOSIA) 0.92 MG CAPS TAKE ONE CAPSULE (0.92MG )  BY MOUTH ONE TIME DAILY Patient taking differently: Take 0.92 mg by mouth daily. 03/09/20   Sater, Nanine Means, MD  SUMAtriptan (IMITREX) 100 MG tablet TAKE 1 TABLET BY MOUTH 1 TIME FOR UP TO 1 DOSE AS NEEDED FOR MIGRAINE. MAY REPEAT IN 2 HOURS IF HEADACHE PERSISTS OR RECURS Patient taking differently: Take 100 mg by mouth daily as needed for migraine. 09/21/20   Sater, Nanine Means, MD  tamsulosin (FLOMAX) 0.4 MG CAPS capsule TAKE 1 CAPSULE(0.4 MG) BY MOUTH DAILY Patient taking differently: Take 0.4 mg by mouth daily. 08/05/20   Sater, Nanine Means, MD  tiZANidine (ZANAFLEX) 4 MG tablet TAKE 1 TABLET BY MOUTH EVERY NIGHT AT BEDTIME Patient taking differently: Take 4 mg by mouth at bedtime. 08/05/20   Sater, Nanine Means, MD  Oxcarbazepine (TRILEPTAL) 300 MG tablet Take 1 tablet (300 mg total) by mouth 2 (two) times daily. Patient not taking: Reported on 07/19/2019 11/11/15 07/19/19  Britt Bottom, MD     Objective    Physical Exam: Vitals:   02/24/21 0200 02/24/21 0230 02/24/21 0300 02/24/21 0330  BP: (!) 109/58 102/68 105/63 112/78  Pulse: 85 85 88 81  Resp: 20 20 20 20   Temp:      TempSrc:      SpO2: 100% 100% 100% 100%  Weight:      Height:        General: appears to be stated age; alert,  oriented Skin: warm, dry, no rash Head:  AT/[place] Mouth:  Oral  mucosa membranes appear dry, normal dentition Neck: supple; trachea midline Heart:  RRR; did not appreciate any M/R/G Lungs: CTAB, did not appreciate any wheezes, rales, or rhonchi Abdomen: + BS; soft, ND, NT Vascular: 2+ pedal pulses b/l; 2+ radial pulses b/l Extremities: no peripheral edema, no muscle wasting Neuro: strength and sensation intact in upper and lower extremities b/l     Labs on Admission: I have personally reviewed following labs and imaging studies  CBC: Recent Labs  Lab 02/23/21 1206 02/24/21 0151  WBC 3.8* 3.4*  NEUTROABS 2.8 2.5  HGB 6.6* 6.6*  HCT 19.4* 19.5*  MCV 89.0 90.3  PLT 256 382   Basic Metabolic Panel: Recent Labs  Lab 02/24/21 0151  NA 139  K 3.5  CL 106  CO2 27  GLUCOSE 100*  BUN 19  CREATININE 1.45*  CALCIUM 8.6*   GFR: Estimated Creatinine Clearance: 57.4 mL/min (A) (by C-G formula based on SCr of 1.45 mg/dL (H)). Liver Function Tests: Recent Labs  Lab 02/24/21 0151  AST 20  ALT 25  ALKPHOS 112  BILITOT 0.7  PROT 7.3  ALBUMIN 4.0   No results for input(s): LIPASE, AMYLASE in the last 168 hours. No results for input(s): AMMONIA in the last 168 hours. Coagulation Profile: No results for input(s): INR, PROTIME in the last 168 hours. Cardiac Enzymes: No results for input(s): CKTOTAL, CKMB, CKMBINDEX, TROPONINI in the last 168 hours. BNP (last 3 results) No results for input(s): PROBNP in the last 8760 hours. HbA1C: No results for input(s): HGBA1C in the last 72 hours. CBG: No results for input(s): GLUCAP in the last 168 hours. Lipid Profile: No results for input(s): CHOL, HDL, LDLCALC, TRIG, CHOLHDL, LDLDIRECT in the last 72 hours. Thyroid Function Tests: No results for input(s): TSH, T4TOTAL, FREET4, T3FREE, THYROIDAB in the last 72 hours. Anemia Panel: No results for input(s): VITAMINB12, FOLATE, FERRITIN, TIBC, IRON, RETICCTPCT in the last 72  hours. Urine analysis:    Component Value Date/Time   COLORURINE YELLOW (A) 09/01/2019 0503   APPEARANCEUR CLOUDY (A) 09/01/2019 0503   APPEARANCEUR Clear 02/19/2019 1710   LABSPEC 1.024 09/01/2019 0503   PHURINE 5.0 09/01/2019 0503   GLUCOSEU NEGATIVE 09/01/2019 0503   HGBUR LARGE (A) 09/01/2019 0503   BILIRUBINUR NEGATIVE 09/01/2019 0503   BILIRUBINUR Negative 02/19/2019 1710   KETONESUR NEGATIVE 09/01/2019 0503   PROTEINUR 30 (A) 09/01/2019 0503   UROBILINOGEN 0.2 12/15/2016 1136   NITRITE NEGATIVE 09/01/2019 0503   LEUKOCYTESUR SMALL (A) 09/01/2019 0503    Radiological Exams on Admission: No results found.   EKG: Independently reviewed, with result as described above.    Assessment/Plan    Principal Problem:   Acute on chronic anemia Active Problems:   Essential hypertension   Pulmonary embolism (HCC)   Hypoproliferative anemia (HCC)   AKI (acute kidney injury) ([place])   HLD (hyperlipidemia)     #) Acute on chronic anemia: In the setting of a history of chronic hypoproliferative anemia with baseline hemoglobin 7-8, the patient presents symptomatically, with 1 to 2 days of dizziness/lightheadedness and dyspnea on exertion, with presenting hemoglobin of 6.6 relative to 7.9 on 02/13/2019.  This is in the context of a history of requiring recurrent PRBC transfusions.  Not associate any chest pain.  She appears hemodynamically stable.  Will initiate transfusion of 2 units PRBC for symptomatic anemia, with these units in route from [place] in the setting of antibodies, as above.  No evidence of acute blood loss.  She follows with Dr.  CEYEMVVK heme-onc for her documented history of hypoproliferative anemia.   Plan: Transfusion 2 units PRBC, as above, with plan to repeat H&H following completion of these 2 units in order to monitor for appropriate increase in ensuing hemoglobin level.  Monitor on telemetry review.  Check INR.  Add on the following to pretransfusion labs: Iron  studies, MMA, folic acid level, reticulocyte count.  Given her presenting shortness of breath, will also check chest x-ray.  Fall precautions ordered.     #) Acute kidney injury on CKD 3a: In setting of history of CKD 3A, with baseline 1-1.2, presenting serum creatinine 1.45 relative to most recent prior value of 1.17 on 02/11/2021.  This appears to be prerenal in nature in the setting of diminished oxygen delivery capacity including as relates to renal perfusion, in the setting of acute on chronic anemia, as above.  Will refrain from IV fluids at this time as she will.  Shortly receiving intravascular repletion via transfusion PRBC, as above.  Plan: Further evaluation management of acute on chronic anemia, as above, including plan for PRBC transfusion.  Monitor strict I's and O's and daily weights.  Attempt to avoid nephrotoxic agents.  Check urinalysis with microscopy and add on random urine sodium as well as random urine creatinine.  Repeat BMP in the morning.       #) History of recurrent DVT/PE: History of several prior DVT/PEs, including bilateral pulmonary emboli in 2019, following which the patient is chronically anticoagulated on Eliquis, with reported good compliance.  While she does have presentation involving acute on chronic anemia, there is no overt evidence of active bleed.  Risk versus benefits appear to favor continuation of Eliquis given extensive history of prior DVTs/PEs , particularly given that there is no overt evidence of active bleed at the time of today's presentation.     Plan: Continue home Eliquis.         #) GERD: documented h/o such; on omeprazole as outpatient.   Plan: continue home PPI.        #) Essential Hypertension: documented h/o such, with outpatient antihypertensive regimen including Norvasc, Toprol-XL.  SBP's in the ED today: 100s to 110smmHg.  However, in the setting of presenting symptomatic acute on chronic anemia, will hold home  antihypertensive medications in order to permit compensatory vasoconstriction and compensatory tachycardia as a consequence of his presentation.   Plan: Close monitoring of subsequent BP via routine VS. hold home antihypertensive medications for now, as above.  Monitor on telemetry.       #) Hyperlipidemia: documented h/o such. On atorvastatin as outpatient.    Plan: continue home statin.       DVT prophylaxis: SCD's   Code Status: Full code Disposition Plan: Per Rounding Team Consults called: none;  Admission status:    PLEASE NOTE THAT DRAGON DICTATION SOFTWARE WAS USED IN THE CONSTRUCTION OF THIS NOTE.   Udell DO [facility] [place]   02/24/2021, 3:49 AM

## 2021-02-24 NOTE — Progress Notes (Signed)
PROGRESS NOTE    Dawn Peters  XHB:[id number] DOB: 03-28-1980 DOA: 02/23/2021 PCP: Dawn Perches, NP    Brief Narrative:  [name] was admitted to the hospital with the working diagnosis of acute on chronic anemia in the setting of myeloproliferative syndrome.   40 yo female with the past medical history of myeloproliferative syndrome, DVT and pulmonary embolism, CKD stage 3a, neurogenic bladder, HTN, dyslipidemia, and multiple sclerosis who presented with dizziness. Reported 1 to 2 days of dizziness and lightheadedness, no dyspnea or chest pain. No melena or hematemesis. Baseline hgb is 7 to 8. On his initial physical examination his blood pressure was 105/67, HR 77 to 87, rr 17 to 20 and oxygen saturation 98% on room air. Lungs clear to auscultation, heart with S1 and S2 present and rhythmic, abdomen soft and non tender and no lower extremity edema.   Na 139, K is 3,5, CL 106, bicarbonate 27, glucose 100, BUN 19 and cr 1,45.  Wbc is 3,4 , hgb 6,6, hct 19,5 and Plt 267 SARS COVID 19 negative  Urine analysis with specific gravity 1,024, 0-5 wbc, no red cells.    Chest radiograph with no infiltrates.   EKG 84 bpm, normal axis and normal intervals, sinus rhythm, with no significant ST segment or T wave changes.   Assessment & Plan:   Principal Problem:   Acute on chronic anemia Active Problems:   Essential hypertension   Pulmonary embolism (HCC)   Hypoproliferative anemia (HCC)   AKI (acute kidney injury) ([place])   HLD (hyperlipidemia)   Acute on chronic anemia, in the setting of myeloproliferative disorder.  Patient had one unit PRBC transfused with good toleration.  Plan to follow up hgb and hct Second unit is being delayed due to the presence of antibodies.  Follow up with hematology recommendations No clinical signs of bleeding Follow up on iron panel   2. AKI on CKD stage 3a follow up renal function with serum cr at 1,44. K is 3,5 and serum bicarbonate at  26. Continue close follow up on renal function and electrolytes.   3. Hx of DVT and PE continue anticoagulation with apixaban   4. GERD continue with antiacid therapy   5. Obesity class 1 and dyslipidemia.  Calculated BMI is 32,2 Continue with statin therapy   5, depression. Continue with as needed alprazolam, Scheduled amitriptyline, buspirone, and citalopram.   Patient continue to be at high risk for worsening anemia    DVT prophylaxis: Apixaban   Code Status:    full  Family Communication:   No family at the bedside     Consultants:  Hematology    Subjective: Patient is feeling better after one unit PRBC transfusion. No signs of active bleeding.   Objective: Vitals:   02/24/21 0630 02/24/21 0700 02/24/21 0730 02/24/21 0800  BP: 125/80 115/82 126/73 128/71  Pulse: 83 86 83 85  Resp:    16  Temp:      TempSrc:      SpO2: 100% 97% 100% 98%  Weight:      Height:       No intake or output data in the 24 hours ending 02/24/21 0942 Filed Weights   02/23/21 2353  Weight: 90.7 kg    Examination:   General: Not in pain or dyspnea, deconditioned  Neurology: Awake and alert, non focal  E ENT: mild  pallor, no icterus, oral mucosa moist Cardiovascular: No JVD. S1-S2 present, rhythmic, no gallops, rubs, or murmurs. No lower extremity  edema. Pulmonary: positive breath sounds bilaterally, with no wheezing, rhonchi or rales. Gastrointestinal. Abdomen soft and non tender Skin. No rashes Musculoskeletal: no joint deformities     Data Reviewed: I have personally reviewed following labs and imaging studies  CBC: Recent Labs  Lab 02/23/21 1206 02/24/21 0151 02/24/21 0407  WBC 3.8* 3.4* 3.1*  NEUTROABS 2.8 2.5 2.2  HGB 6.6* 6.6* 6.2*  HCT 19.4* 19.5* 18.7*  MCV 89.0 90.3 90.8  PLT 256 267 726   Basic Metabolic Panel: Recent Labs  Lab 02/24/21 0151 02/24/21 0407  NA 139 139  K 3.5 3.5  CL 106 106  CO2 27 26  GLUCOSE 100* 100*  BUN 19 18  CREATININE  1.45* 1.44*  CALCIUM 8.6* 8.4*  MG  --  2.2   GFR: Estimated Creatinine Clearance: 57.8 mL/min (A) (by C-G formula based on SCr of 1.44 mg/dL (H)). Liver Function Tests: Recent Labs  Lab 02/24/21 0151 02/24/21 0407  AST 20 19  ALT 25 24  ALKPHOS 112 104  BILITOT 0.7 0.6  PROT 7.3 6.6  ALBUMIN 4.0 3.7   No results for input(s): LIPASE, AMYLASE in the last 168 hours. No results for input(s): AMMONIA in the last 168 hours. Coagulation Profile: Recent Labs  Lab 02/24/21 0407  INR 1.0   Cardiac Enzymes: No results for input(s): CKTOTAL, CKMB, CKMBINDEX, TROPONINI in the last 168 hours. BNP (last 3 results) No results for input(s): PROBNP in the last 8760 hours. HbA1C: No results for input(s): HGBA1C in the last 72 hours. CBG: No results for input(s): GLUCAP in the last 168 hours. Lipid Profile: No results for input(s): CHOL, HDL, LDLCALC, TRIG, CHOLHDL, LDLDIRECT in the last 72 hours. Thyroid Function Tests: No results for input(s): TSH, T4TOTAL, FREET4, T3FREE, THYROIDAB in the last 72 hours. Anemia Panel: Recent Labs    02/24/21 0407  RETICCTPCT 0.4      Radiology Studies: I have reviewed all of the imaging during this hospital visit personally     Scheduled Meds:  amantadine  100 mg Oral BID   amitriptyline  50 mg Oral QHS   atorvastatin  10 mg Oral Daily   busPIRone  15 mg Oral BID   citalopram  20 mg Oral Daily   folic acid  1 mg Oral Daily   norethindrone  1 tablet Oral Daily   Ozanimod HCl  0.92 mg Oral Daily   pantoprazole  40 mg Oral Daily   Continuous Infusions:  sodium chloride       LOS: 0 days        Dawn Sledge Gerome Apley, MD

## 2021-02-24 NOTE — ED Notes (Signed)
Pt didn't receive tylenol suppository, pt received oral tablet tylenol, error in charting

## 2021-02-24 NOTE — ED Provider Notes (Signed)
[facility] DEPT Provider Note   CSN: [id number] Arrival date & time: 02/23/21  2340     History Chief Complaint  Patient presents with   Dizziness   Anemia    Dawn Peters is a 40 y.o. female with history of stage IIIb CKD and hypoproliferative anemia who presents with concern for 3 days of lightheadedness as well as low hemoglobin of 6.6 at her  outpatient lab visit.  Patient states she has no history of symptomatic anemia prior to September of this year.  States that she is required 10 blood transfusions in September of this year secondary to anemia.  Was recently admitted on 12/5 and discharged on 12/8 for symptomatic anemia.  Has been receiving injections of EPO, which she most recently recived on 12/9, per patient. She has undergone colonoscopy which was negative.  Pending capsule endoscopy at this time.  She is no longer taking her Eliquis per inpatient recommendations.  She is scheduled to follow-up with Dr. Alen Blew from outpatient hematology on 03/05/2021.    I have personally reviewed this patient's medical records. She does have history of PE/DVT formerly on Eliquis, hypertension, and MS.   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   left lower extremity-- treated with eliquis   Gait disturbance    due to MS   GERD (gastroesophageal reflux disease)    watch diet   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/p  THA   History of DVT of lower extremity 2007   History of encephalopathy 67/8938   acute metabolic encephalopathy secondary to MS,  resolved   History of MRSA infection 2004   right hip infection post THA   History of pulmonary embolus (PE) 2005   Hydronephrosis, left    w/ acute  kidney injury 02/ 2019   Hypertension    Left-sided weakness    due to MS   Migraines    MS (multiple sclerosis) [facility]) neurologist-  dr Renne Musca   dx 2000--   Muscle spasticity    Neurogenic bladder    due to MS   Pulmonary embolism, bilateral ([place]) 09/17/2017   treated w/ eliquis   Strains to urinate    Urgency of urination     Patient Active Problem List   Diagnosis Date Noted   Hypoproliferative anemia ([place]) 12/21/5100   Folic acid deficiency 58/52/7782   Acute on chronic anemia 02/08/2021   Symptomatic anemia 11/21/2020   Amenorrhea 11/20/2018   Well woman exam 11/20/2018   MS (multiple sclerosis) ([place]) 03/14/2018   Stage 3 chronic kidney disease (HCC)    Obesity (BMI 30.0-34.9)    Anemia 01/09/2018   Pulmonary embolism ([place]) 09/18/2017   Elevated serum creatinine 05/12/2017   Acute encephalopathy 04/20/2017   Hydronephrosis of left kidney 10/24/2016   Attention deficit 10/24/2016   Left leg weakness 08/13/2016   Chronic pain 08/13/2016   Mild renal insufficiency 08/13/2016   Left-sided weakness    High risk medication use 09/15/2015   Hip pain, bilateral 07/28/2015   Urinary hesitancy 07/28/2015   Multiple sclerosis exacerbation ([place]) 07/17/2015   Urinary disorder 06/11/2015   Gait disturbance 05/19/2015   Numbness 05/19/2015  Depression with anxiety 05/13/2015   Other fatigue 05/13/2015   Left knee pain 05/13/2015   Avascular necrosis of bones of both hips ([place]) 05/13/2015   Screening for HIV (human immunodeficiency virus) 07/08/2014   Essential hypertension, benign 11/19/2013   Anxiety state, unspecified 11/19/2013   Screen for STD (sexually transmitted disease) 11/01/2013   Encounter for routine gynecological examination 11/01/2013   Oral contraceptive use 10/20/2011   Multiple sclerosis ([place]) 08/08/2007   RASH AND OTHER NONSPECIFIC SKIN ERUPTION 06/22/2007   AVASCULAR NECROSIS, FEMORAL HEAD 03/11/2006   Essential hypertension 02/03/2006    MICROALBUMINURIA 02/03/2006   DVT, HX OF 02/03/2006    Past Surgical History:  Procedure Laterality Date   CYSTOSCOPY WITH RETROGRADE PYELOGRAM, URETEROSCOPY AND STENT PLACEMENT Bilateral 11/29/2017   Procedure: BILATERAL  RETROGRADE PYELOGRAM, LEFT DIAGNOSIC URETEROSCOPY AND STENT LEFT PLACEMENT;  Surgeon: Ardis Hughs, MD;  Location: [facility];  Service: Urology;  Laterality: Bilateral;   HIP ARTHROSCOPY Left 07-05-2013   dr pill @[facility]    iliopsosas release, synovectomy   REVISION TOTAL HIP ARTHROPLASTY Right 03-28-2003    dr Alvan Dame @[facility]    TOTAL HIP ARTHROPLASTY Bilateral left 11-05-2002  dr Alvan Dame @[facility] ;   right 06/ 2004  @[facility]      OB History     Gravida  1   Para  1   Term  1   Preterm      AB      Living  1      SAB      IAB      Ectopic      Multiple      Live Births  1           Family History  Problem Relation Age of Onset   Healthy Mother    Healthy Father    Breast cancer Other        paternal grandmother dx age 22   Colon cancer Other        paternal grandmother   Hypertension Other    Diabetes Other     Social History   Tobacco Use   Smoking status: Never   Smokeless tobacco: Never  Vaping Use   Vaping Use: Never used  Substance Use Topics   Alcohol use: No   Drug use: No    Home Medications Prior to Admission medications   Medication Sig Start Date End Date Taking? Authorizing Provider  acetaminophen (TYLENOL) 500 MG tablet Take 1,000 mg by mouth every 6 (six) hours as needed for moderate pain or headache.    [provider]  amantadine (SYMMETREL) 100 MG capsule Take 100 mg by mouth 2 (two) times daily. 09/27/20   [provider]  amitriptyline (ELAVIL) 25 MG tablet TAKE 1 TO 2 TABLETS(25 TO 50 MG) BY MOUTH AT BEDTIME Patient taking differently: Take 50 mg by mouth at bedtime. 11/17/20   Sater, Nanine Means, MD  amLODipine (NORVASC) 5 MG tablet Take 1 tablet (5 mg total) by mouth daily. Patient  taking differently: Take 5 mg by mouth daily after breakfast. 05/21/17   Arrien, Jimmy Picket, MD  amphetamine-dextroamphetamine (ADDERALL XR) 20 MG 24 hr capsule Take 1 capsule (20 mg total) by mouth daily. 02/04/21   Sater, Nanine Means, MD  apixaban (ELIQUIS) 5 MG TABS tablet Take 1 tablet (5 mg total) by mouth 2 (two) times daily. 09/21/20   Sater, Nanine Means, MD  atorvastatin (LIPITOR) 10 MG tablet Take 10 mg by mouth daily. 12/24/20   [provider]  baclofen (LIORESAL) 20 MG tablet Take 1 tablet (20 mg total) by mouth 4 (four) times daily as needed for muscle spasms. 09/16/19   Sater, Nanine Means, MD  busPIRone (BUSPAR) 15 MG tablet Take 1 tablet (15 mg total) by mouth 2 (two) times daily. Must keep follow up 09/21/20 for ongoing refills Patient taking differently: Take 15 mg by mouth 2 (two) times daily. 08/18/20   Sater, Nanine Means, MD  citalopram (CELEXA) 20 MG tablet Take 1 tablet (20 mg total) by mouth daily. 02/19/19   Sater, Nanine Means, MD  dalfampridine 10 MG TB12 Take 1 tablet (10 mg total) by mouth 2 (two) times daily. 09/21/20   Sater, Nanine Means, MD  doxycycline (VIBRAMYCIN) 100 MG capsule Take 1 capsule (100 mg total) by mouth 2 (two) times daily. Patient not taking: Reported on 02/09/2021 12/26/20   Hendricks Limes, PA-C  ferrous sulfate 324 MG TBEC Take 324 mg by mouth daily.    [provider]  folic acid (FOLVITE) 1 MG tablet Take 1 tablet (1 mg total) by mouth daily. 02/12/21 03/14/21  Amin, Jeanella Flattery, MD  gabapentin (NEURONTIN) 800 MG tablet Take 1 tablet (800 mg total) by mouth 4 (four) times daily. 09/21/20   Sater, Nanine Means, MD  metoprolol succinate (TOPROL-XL) 50 MG 24 hr tablet Take 50 mg by mouth daily. 09/27/20   [provider]  Multiple Vitamin (MULTIVITAMIN WITH MINERALS) TABS tablet Take 1 tablet by mouth daily. 09/20/17   Kayleen Memos, DO  norethindrone (MICRONOR) 0.35 MG tablet Take 1 tablet (0.35 mg total) by mouth daily. 11/19/18   Clarnce Flock, MD  omeprazole (PRILOSEC) 40 MG capsule Take 40 mg by mouth daily. 09/27/20   [provider]  Ozanimod HCl (ZEPOSIA) 0.92 MG CAPS TAKE ONE CAPSULE (0.92MG )  BY MOUTH ONE TIME DAILY Patient taking differently: Take 0.92 mg by mouth daily. 03/09/20   Sater, Nanine Means, MD  SUMAtriptan (IMITREX) 100 MG tablet TAKE 1 TABLET BY MOUTH 1 TIME FOR UP TO 1 DOSE AS NEEDED FOR MIGRAINE. MAY REPEAT IN 2 HOURS IF HEADACHE PERSISTS OR RECURS Patient taking differently: Take 100 mg by mouth daily as needed for migraine. 09/21/20   Sater, Nanine Means, MD  tamsulosin (FLOMAX) 0.4 MG CAPS capsule TAKE 1 CAPSULE(0.4 MG) BY MOUTH DAILY Patient taking differently: Take 0.4 mg by mouth daily. 08/05/20   Sater, Nanine Means, MD  tiZANidine (ZANAFLEX) 4 MG tablet TAKE 1 TABLET BY MOUTH EVERY NIGHT AT BEDTIME Patient taking differently: Take 4 mg by mouth at bedtime. 08/05/20   Sater, Nanine Means, MD  Oxcarbazepine (TRILEPTAL) 300 MG tablet Take 1 tablet (300 mg total) by mouth 2 (two) times daily. Patient not taking: Reported on 07/19/2019 11/11/15 07/19/19  Britt Bottom, MD    Allergies    Ace inhibitors, Amoxicillin, and Lisinopril  Review of Systems   Review of Systems  Constitutional: Negative.   HENT: Negative.    Eyes: Negative.   Respiratory: Negative.    Cardiovascular: Negative.   Gastrointestinal: Negative.   Genitourinary: Negative.        Denies melena or hematochezia  Musculoskeletal: Negative.   Neurological:  Positive for light-headedness and headaches. Negative for dizziness.  Psychiatric/Behavioral: Negative.     Physical Exam Updated Vital Signs BP 103/72 (BP Location: Left Arm)    Pulse 84    Temp 98.1 F (36.7 C) (Oral)    Resp 17    Ht 5\' 6"  (  1.676 m)    Wt 90.7 kg    LMP 02/03/2021 (Approximate)    SpO2 93%    BMI 32.28 kg/m   Physical Exam Vitals and nursing note reviewed.  Constitutional:      Appearance: She is not ill-appearing or toxic-appearing.  HENT:     Head:  Normocephalic and atraumatic.     Nose: Nose normal. No congestion.     Mouth/Throat:     Mouth: Mucous membranes are moist.     Pharynx: No oropharyngeal exudate or posterior oropharyngeal erythema.  Eyes:     General:        Right eye: No discharge.        Left eye: No discharge.     Extraocular Movements: Extraocular movements intact.     Conjunctiva/sclera: Conjunctivae normal.     Pupils: Pupils are equal, round, and reactive to light.  Cardiovascular:     Rate and Rhythm: Normal rate and regular rhythm.     Pulses: Normal pulses.     Heart sounds: Normal heart sounds. No murmur heard. Pulmonary:     Effort: Pulmonary effort is normal. No respiratory distress.     Breath sounds: Normal breath sounds. No wheezing or rales.  Abdominal:     General: Bowel sounds are normal. There is no distension.     Palpations: Abdomen is soft.     Tenderness: There is no abdominal tenderness. There is no right CVA tenderness, left CVA tenderness, guarding or rebound.  Musculoskeletal:        General: No deformity.     Cervical back: Neck supple. No tenderness.     Right lower leg: No edema.     Left lower leg: No edema.  Lymphadenopathy:     Cervical: No cervical adenopathy.  Skin:    General: Skin is warm and dry.     Capillary Refill: Capillary refill takes less than 2 seconds.  Neurological:     General: No focal deficit present.     Mental Status: She is alert and oriented to person, place, and time. Mental status is at baseline.  Psychiatric:        Mood and Affect: Mood normal.    ED Results / Procedures / Treatments   Labs (all labs ordered are listed, but only abnormal results are displayed) Labs Reviewed  CBC WITH DIFFERENTIAL/PLATELET  COMPREHENSIVE METABOLIC PANEL  TYPE AND SCREEN    EKG EKG Interpretation  Date/Time:  Wednesday February 24 2021 00:03:07 EST Ventricular Rate:  84 PR Interval:  159 QRS Duration: 94 QT Interval:  386 QTC Calculation: 457 R  Axis:   11 Text Interpretation: Sinus rhythm Low voltage, precordial leads No significant change since last tracing Confirmed by Addison Lank [phone]) on 02/24/2021 12:18:14 AM  Radiology No results found.  Procedures .Critical Care Performed by: Emeline Darling, PA-C Authorized by: Emeline Darling, PA-C   Critical care provider statement:    Critical care time (minutes):  45   Critical care was time spent personally by me on the following activities:  Development of treatment plan with patient or surrogate, discussions with consultants, evaluation of patient's response to treatment, examination of patient, obtaining history from patient or surrogate, ordering and performing treatments and interventions, ordering and review of laboratory studies, ordering and review of radiographic studies, pulse oximetry and re-evaluation of patient's condition   Medications Ordered in ED Medications - No data to display  ED Course  I have reviewed the triage vital signs  and the nursing notes.  Pertinent labs & imaging results that were available during my care of the patient were reviewed by me and considered in my medical decision making (see chart for details).  Clinical Course as of 02/24/21 0414  Wed Feb 24, 2021  0230 I was informed by RN of critical hemoglobin value of 6.6.  Unfortunately because the patient's frequent transfusions the last few months she has several antibodies; for this reason she will need to have blood driven to our facility from another blood bank in [place], [place]. [RS]  [phone] Consult to hospitalist, Dr. Velia Meyer, who is agreeable to admitting this patient to service.  Appreciate his collaboration in the care of this patient. [RS]    Clinical Course User Index [RS] Jeena Arnett, Sharlene Dory   MDM Rules/Calculators/A&P                         40 year old female with history of symptomatic anemia presents with low hemoglobin of 6.6 in the outpatient setting as  well as lightheadedness and headache for the last 3 days.  Vital signs are normal and intake.  Cardiopulmonary exam is normal, abdominal exam is benign.  Patient is neurovascularly intact in all 4 extremities.  CBC with leukopenia to 3.4, hemoglobin of 6.6.  Dropped from 7.9, 12 days ago.  CMP with creatinine of 1.4 near patient's baseline.  As above, appropriately crossmatched blood to be transported from [place] due to patient's antibodies; will plan for admission for symptomatic anemia at this time.  Candace voiced understanding of her medical evaluation and treatment plan thus far.  Each of her questions was answered to her expressed satisfaction.  She is amenable to plan for admission at this time.  Consult to hospitalist as above, patient admitted to inpatient service.  This chart was dictated using voice recognition software, Dragon. Despite the best efforts of this provider to proofread and correct errors, errors may still occur which can change documentation meaning.    Final Clinical Impression(s) / ED Diagnoses Final diagnoses:  None    Rx / DC Orders ED Discharge Orders     None        Aura Dials 02/24/21 [place], MD 02/24/21 [phone]

## 2021-02-24 NOTE — ED Notes (Signed)
Lab draw x 2 unsuccessful  

## 2021-02-25 DIAGNOSIS — D630 Anemia in neoplastic disease: Secondary | ICD-10-CM | POA: Diagnosis present

## 2021-02-25 DIAGNOSIS — E669 Obesity, unspecified: Secondary | ICD-10-CM | POA: Diagnosis present

## 2021-02-25 DIAGNOSIS — Z8249 Family history of ischemic heart disease and other diseases of the circulatory system: Secondary | ICD-10-CM | POA: Diagnosis not present

## 2021-02-25 DIAGNOSIS — R0602 Shortness of breath: Secondary | ICD-10-CM | POA: Diagnosis present

## 2021-02-25 DIAGNOSIS — Z803 Family history of malignant neoplasm of breast: Secondary | ICD-10-CM | POA: Diagnosis not present

## 2021-02-25 DIAGNOSIS — Z800 Family history of malignant neoplasm of digestive organs: Secondary | ICD-10-CM | POA: Diagnosis not present

## 2021-02-25 DIAGNOSIS — Z6832 Body mass index (BMI) 32.0-32.9, adult: Secondary | ICD-10-CM | POA: Diagnosis not present

## 2021-02-25 DIAGNOSIS — K219 Gastro-esophageal reflux disease without esophagitis: Secondary | ICD-10-CM | POA: Diagnosis present

## 2021-02-25 DIAGNOSIS — Z833 Family history of diabetes mellitus: Secondary | ICD-10-CM | POA: Diagnosis not present

## 2021-02-25 DIAGNOSIS — D649 Anemia, unspecified: Secondary | ICD-10-CM | POA: Diagnosis not present

## 2021-02-25 DIAGNOSIS — N179 Acute kidney failure, unspecified: Secondary | ICD-10-CM | POA: Diagnosis present

## 2021-02-25 DIAGNOSIS — D619 Aplastic anemia, unspecified: Secondary | ICD-10-CM | POA: Diagnosis present

## 2021-02-25 DIAGNOSIS — N1832 Chronic kidney disease, stage 3b: Secondary | ICD-10-CM | POA: Diagnosis present

## 2021-02-25 DIAGNOSIS — I129 Hypertensive chronic kidney disease with stage 1 through stage 4 chronic kidney disease, or unspecified chronic kidney disease: Secondary | ICD-10-CM | POA: Diagnosis present

## 2021-02-25 DIAGNOSIS — N319 Neuromuscular dysfunction of bladder, unspecified: Secondary | ICD-10-CM | POA: Diagnosis present

## 2021-02-25 DIAGNOSIS — Z20822 Contact with and (suspected) exposure to covid-19: Secondary | ICD-10-CM | POA: Diagnosis present

## 2021-02-25 DIAGNOSIS — I10 Essential (primary) hypertension: Secondary | ICD-10-CM | POA: Diagnosis not present

## 2021-02-25 DIAGNOSIS — Z86718 Personal history of other venous thrombosis and embolism: Secondary | ICD-10-CM | POA: Diagnosis not present

## 2021-02-25 DIAGNOSIS — Z86711 Personal history of pulmonary embolism: Secondary | ICD-10-CM | POA: Diagnosis not present

## 2021-02-25 DIAGNOSIS — Z96641 Presence of right artificial hip joint: Secondary | ICD-10-CM | POA: Diagnosis present

## 2021-02-25 DIAGNOSIS — E785 Hyperlipidemia, unspecified: Secondary | ICD-10-CM | POA: Diagnosis present

## 2021-02-25 DIAGNOSIS — F32A Depression, unspecified: Secondary | ICD-10-CM | POA: Diagnosis present

## 2021-02-25 DIAGNOSIS — D471 Chronic myeloproliferative disease: Secondary | ICD-10-CM | POA: Diagnosis present

## 2021-02-25 DIAGNOSIS — Z8614 Personal history of Methicillin resistant Staphylococcus aureus infection: Secondary | ICD-10-CM | POA: Diagnosis not present

## 2021-02-25 DIAGNOSIS — Z7901 Long term (current) use of anticoagulants: Secondary | ICD-10-CM | POA: Diagnosis not present

## 2021-02-25 DIAGNOSIS — G35 Multiple sclerosis: Secondary | ICD-10-CM | POA: Diagnosis present

## 2021-02-25 LAB — BASIC METABOLIC PANEL
Anion gap: 8 (ref 5–15)
BUN: 16 mg/dL (ref 6–20)
CO2: 25 mmol/L (ref 22–32)
Calcium: 8.4 mg/dL — ABNORMAL LOW (ref 8.9–10.3)
Chloride: 108 mmol/L (ref 98–111)
Creatinine, Ser: 1.27 mg/dL — ABNORMAL HIGH (ref 0.44–1.00)
GFR, Estimated: 54 mL/min — ABNORMAL LOW (ref >60)
Glucose, Bld: 111 mg/dL — ABNORMAL HIGH (ref 70–99)
Potassium: 3.5 mmol/L (ref 3.5–5.1)
Sodium: 141 mmol/L (ref 135–145)

## 2021-02-25 LAB — CBC
HCT: 22.3 % — ABNORMAL LOW (ref 36.0–46.0)
Hemoglobin: 7.7 g/dL — ABNORMAL LOW (ref 12.0–15.0)
MCH: 30.4 pg (ref 26.0–34.0)
MCHC: 34.5 g/dL (ref 30.0–36.0)
MCV: 88.1 fL (ref 80.0–100.0)
Platelets: 226 10*3/uL (ref 150–400)
RBC: 2.53 MIL/uL — ABNORMAL LOW (ref 3.87–5.11)
RDW: 14.2 % (ref 11.5–15.5)
WBC: 3.3 10*3/uL — ABNORMAL LOW (ref 4.0–10.5)
nRBC: 0 % (ref 0.0–0.2)

## 2021-02-25 MED ORDER — HYDROCORTISONE 0.5 % EX CREA
TOPICAL_CREAM | CUTANEOUS | Status: DC | PRN
  Filled 2021-02-25: qty 28.35

## 2021-02-25 MED ORDER — TIZANIDINE HCL 4 MG PO TABS
4.0000 mg | ORAL_TABLET | Freq: Once | ORAL | Status: AC
Start: 2021-02-25 — End: 2021-02-25
  Administered 2021-02-25: 01:00:00 4 mg via ORAL
  Filled 2021-02-25: qty 1

## 2021-02-25 NOTE — Progress Notes (Signed)
PROGRESS NOTE    Dawn Peters  ONG:[id number] DOB: [birth year] DOA: 02/23/2021 PCP: Carylon Perches, NP    Brief Narrative:  [name] was admitted to the hospital with the working diagnosis of acute on chronic anemia in the setting of myeloproliferative syndrome.    40 yo female with the past medical history of myeloproliferative syndrome, DVT and pulmonary embolism, CKD stage 3a, neurogenic bladder, HTN, dyslipidemia, and multiple sclerosis who presented with dizziness. Reported 1 to 2 days of dizziness and lightheadedness, no dyspnea or chest pain. No melena or hematemesis. Baseline hgb is 7 to 8. On his initial physical examination his blood pressure was 105/67, HR 77 to 87, rr 17 to 20 and oxygen saturation 98% on room air. Lungs clear to auscultation, heart with S1 and S2 present and rhythmic, abdomen soft and non tender and no lower extremity edema.    Na 139, K is 3,5, CL 106, bicarbonate 27, glucose 100, BUN 19 and cr 1,45.  Wbc is 3,4 , hgb 6,6, hct 19,5 and Plt 267 SARS COVID 19 negative   Urine analysis with specific gravity 1,024, 0-5 wbc, no red cells.     Chest radiograph with no infiltrates.    EKG 84 bpm, normal axis and normal intervals, sinus rhythm, with no significant ST segment or T wave changes  Patient with very difficult to match blood due to increase antibodies.  She had 2 units PRBC but continue to be very weak and deconditioned.   Assessment & Plan:   Principal Problem:   Acute on chronic anemia Active Problems:   Essential hypertension   Pulmonary embolism (HCC)   Symptomatic anemia   Hypoproliferative anemia (HCC)   AKI (acute kidney injury) ([place])   HLD (hyperlipidemia)     Acute on chronic anemia, in the setting of myeloproliferative disorder.  Now patient is sp 2 units PRBC transfusion, she has multiple antibodies and is difficult to match PRBC.    Her hgb this am is 7,7 but she continue to feel very weak and deconditioned, easy  fatigability, and somnolence. Plan to follow up hgb and hct, she may need further PRBC transfusion. Follow up with hematology recommendations.    2. AKI on CKD stage 3a  Serum cr is 1,27 with K at 3,5 and serum bicarbonate at 25. Continue close follow up on renal function and electrolytes Avoid worsening anemia. Follow up on renal function in am.    3. Hx of DVT and PE  Continue anticoagulation with apixaban     4. GERD  Continue with pantoprazole     5. Obesity class 1 and dyslipidemia.  Calculated BMI is 32,2 On statin therapy    5, depression. PRN alprazolam, Continue with amitriptyline, buspirone, and citalopram.    Patient continue to be at high risk for worsening symptomatic anemia   Status is: Observation   DVT prophylaxis: Apixaban   Code Status:    full  Family Communication:   I spoke with patient's fiance at the bedside, we talked in detail about patient's condition, plan of care and prognosis and all questions were addressed.    Consultants:  Hematology   P  Subjective: Patient with no nausea or vomiting, no dyspnea or chest pain, she continue to be very weak and deconditioned, easy fatigability and somnolence. Not back to her baseline   Objective: Vitals:   02/25/21 1025 02/25/21 1025 02/25/21 1230 02/25/21 1233  BP: 113/81 113/81 119/81 119/81  Pulse: 93 93 90 90  Resp:  18 18 18 18   Temp: 98.9 F (37.2 C) 98.9 F (37.2 C) 98.7 F (37.1 C) 98.7 F (37.1 C)  TempSrc: Oral Oral Oral Oral  SpO2:  100%  100%  Weight:      Height:        Intake/Output Summary (Last 24 hours) at 02/25/2021 1516 Last data filed at 02/25/2021 1443 Gross per 24 hour  Intake 983.75 ml  Output --  Net 983.75 ml   Filed Weights   02/23/21 2353  Weight: 90.7 kg    Examination:   General: Not in pain or dyspnea, deconditioned  Neurology: Awake and alert, non focal  E ENT: positive pallor, no icterus, oral mucosa moist Cardiovascular: No JVD. S1-S2 present,  rhythmic, no gallops, rubs, or murmurs. No lower extremity edema. Pulmonary: positive breath sounds bilaterally, with no wheezing, rhonchi or rales. Gastrointestinal. Abdomen soft and non tender Skin. No rashes Musculoskeletal: no joint deformities     Data Reviewed: I have personally reviewed following labs and imaging studies  CBC: Recent Labs  Lab 02/23/21 1206 02/24/21 0151 02/24/21 0407 02/25/21 0608  WBC 3.8* 3.4* 3.1* 3.3*  NEUTROABS 2.8 2.5 2.2  --   HGB 6.6* 6.6* 6.2* 7.7*  HCT 19.4* 19.5* 18.7* 22.3*  MCV 89.0 90.3 90.8 88.1  PLT 256 267 247 563   Basic Metabolic Panel: Recent Labs  Lab 02/24/21 0151 02/24/21 0407 02/25/21 0608  NA 139 139 141  K 3.5 3.5 3.5  CL 106 106 108  CO2 27 26 25   GLUCOSE 100* 100* 111*  BUN 19 18 16   CREATININE 1.45* 1.44* 1.27*  CALCIUM 8.6* 8.4* 8.4*  MG  --  2.2  --    GFR: Estimated Creatinine Clearance: 65.5 mL/min (A) (by C-G formula based on SCr of 1.27 mg/dL (H)). Liver Function Tests: Recent Labs  Lab 02/24/21 0151 02/24/21 0407  AST 20 19  ALT 25 24  ALKPHOS 112 104  BILITOT 0.7 0.6  PROT 7.3 6.6  ALBUMIN 4.0 3.7   No results for input(s): LIPASE, AMYLASE in the last 168 hours. No results for input(s): AMMONIA in the last 168 hours. Coagulation Profile: Recent Labs  Lab 02/24/21 0407  INR 1.0   Cardiac Enzymes: No results for input(s): CKTOTAL, CKMB, CKMBINDEX, TROPONINI in the last 168 hours. BNP (last 3 results) No results for input(s): PROBNP in the last 8760 hours. HbA1C: No results for input(s): HGBA1C in the last 72 hours. CBG: No results for input(s): GLUCAP in the last 168 hours. Lipid Profile: No results for input(s): CHOL, HDL, LDLCALC, TRIG, CHOLHDL, LDLDIRECT in the last 72 hours. Thyroid Function Tests: No results for input(s): TSH, T4TOTAL, FREET4, T3FREE, THYROIDAB in the last 72 hours. Anemia Panel: Recent Labs    02/24/21 0407  RETICCTPCT 0.4      Radiology Studies: I  have reviewed all of the imaging during this hospital visit personally     Scheduled Meds:  amantadine  100 mg Oral BID   amitriptyline  50 mg Oral QHS   atorvastatin  10 mg Oral Daily   busPIRone  15 mg Oral BID   citalopram  20 mg Oral Daily   folic acid  1 mg Oral Daily   norethindrone  1 tablet Oral Daily   Ozanimod HCl  0.92 mg Oral Daily   pantoprazole  40 mg Oral Daily   Continuous Infusions:   LOS: 0 days        Dawn Peters Gerome Apley, MD

## 2021-02-26 LAB — BASIC METABOLIC PANEL
Anion gap: 8 (ref 5–15)
BUN: 13 mg/dL (ref 6–20)
CO2: 24 mmol/L (ref 22–32)
Calcium: 8.5 mg/dL — ABNORMAL LOW (ref 8.9–10.3)
Chloride: 106 mmol/L (ref 98–111)
Creatinine, Ser: 1.29 mg/dL — ABNORMAL HIGH (ref 0.44–1.00)
GFR, Estimated: 53 mL/min — ABNORMAL LOW (ref >60)
Glucose, Bld: 93 mg/dL (ref 70–99)
Potassium: 3.4 mmol/L — ABNORMAL LOW (ref 3.5–5.1)
Sodium: 138 mmol/L (ref 135–145)

## 2021-02-26 LAB — CBC
HCT: 25.7 % — ABNORMAL LOW (ref 36.0–46.0)
Hemoglobin: 9 g/dL — ABNORMAL LOW (ref 12.0–15.0)
MCH: 30.7 pg (ref 26.0–34.0)
MCHC: 35 g/dL (ref 30.0–36.0)
MCV: 87.7 fL (ref 80.0–100.0)
Platelets: 213 10*3/uL (ref 150–400)
RBC: 2.93 MIL/uL — ABNORMAL LOW (ref 3.87–5.11)
RDW: 13.8 % (ref 11.5–15.5)
WBC: 3.5 10*3/uL — ABNORMAL LOW (ref 4.0–10.5)
nRBC: 0 % (ref 0.0–0.2)

## 2021-02-26 NOTE — Progress Notes (Signed)
Pt discharged home. AVS printed. Educational teaching completed with teach back method. PIV removed. VSS. Pt has all belongings. No further questions at this.

## 2021-02-26 NOTE — Discharge Summary (Signed)
Physician Discharge Summary  Dawn Peters HEN:[id number] DOB: [birth year] DOA: 02/23/2021  PCP: Dawn Perches, NP  Admit date: 02/23/2021 Discharge date: 02/26/2021  Admitted From: Home Disposition:  Home   Recommendations for Outpatient Follow-up and new medication changes:  Follow up with Dawn Peters in 7 to 10 days  Follow up with Hematology as scheduled  Home Health: no   Equipment/Devices: no    Discharge Condition: stable  CODE STATUS: full  Diet recommendation:  regular   Brief/Interim Summary: Dawn Peters was admitted to the hospital with the working diagnosis of acute on chronic anemia in the setting of myeloproliferative syndrome.    40 yo female with the past medical history of myeloproliferative syndrome, DVT and pulmonary embolism, CKD stage 3a, neurogenic bladder, HTN, dyslipidemia, and multiple sclerosis who presented with dizziness. Reported 1 to 2 days of dizziness and lightheadedness, no dyspnea or chest pain. No melena or hematemesis. Baseline hgb is 7 to 8. On his initial physical examination his blood pressure was 105/67, HR 77 to 87, rr 17 to 20 and oxygen saturation 98% on room air. Lungs clear to auscultation, heart with S1 and S2 present and rhythmic, abdomen soft and non tender and no lower extremity edema.    Na 139, K is 3,5, CL 106, bicarbonate 27, glucose 100, BUN 19 and cr 1,45.  Wbc is 3,4 , hgb 6,6, hct 19,5 and Plt 267 SARS COVID 19 negative   Urine analysis with specific gravity 1,024, 0-5 wbc, no red cells.     Chest radiograph with no infiltrates.    EKG 84 bpm, normal axis and normal intervals, sinus rhythm, with no significant ST segment or T wave changes   Patient with very difficult to match blood due to increase antibodies.  She had 2 units PRBC but continue to be very weak and deconditioned.   Follow up hgb is up to 9.0 with hct at 25,7, with improvement in her symptoms.  Plan to follow up as outpatient with hematology.   Acute  on chronic anemia, in the setting of myeloproliferative disorder.  Patient was admitted to the medical ward and placed on a remote telemetry monitoring.  She had 2 units PRBC transfused with good toleration. Her symptoms slowly improved, and her follow up hgb reached 9.0 Patient will follow up as outpatient.   Iron profile from 02/08/21 with serum iron 158, TIBC 173 and transferrin saturation of 91, with ferritin 903. Will recommend to hold on oral iron supplementation until further follow up with hematology as outpatient.  As outpatient patient had treatment with retacrit   2. AKI on CKD stage 3a.  Patient responded well to PRBC transfusion. Her follow-up creatinine is 1.29, sodium 138, potassium 3.4, chloride 106 and bicarb 24.  3.  History of DVT and PE. Continue anticoagulation with apixaban   4.  GERD. Continue with pantoprazole   5.  Obesity class I, dyslipidemia. Her calculated BMI is 31.4, Continue with statin therapy  6.  Depression. Continue with amitriptyline, buspirone and citalopram. She received as needed alprazolam for anxiety during her hospitalization.   7. Multiple sclerosis. Continue with Ozanimod. Follow up with Neurology as outpatient.   Discharge Diagnoses:  Principal Problem:   Acute on chronic anemia Active Problems:   Essential hypertension   Pulmonary embolism (HCC)   Symptomatic anemia   Hypoproliferative anemia (HCC)   AKI (acute kidney injury) ([place])   HLD (hyperlipidemia)    Discharge Instructions  Discharge Instructions     Diet -  low sodium heart healthy   Complete by: As directed    Discharge instructions   Complete by: As directed    Please follow up with hematology as scheduled   Increase activity slowly   Complete by: As directed       Allergies as of 02/26/2021       Reactions   Ace Inhibitors Swelling   Amoxicillin Itching   Did it involve swelling of the face/tongue/throat, SOB, or low BP? Yes Did it involve sudden or  severe rash/hives, skin peeling, or any reaction on the inside of your mouth or nose? No Did you need to seek medical attention at a hospital or doctor's office? No When did it last happen?      unknown  If all above answers are NO, may proceed with cephalosporin use.;   Lisinopril Swelling   Lower lip swelling        Medication List     STOP taking these medications    doxycycline 100 MG capsule Commonly known as: VIBRAMYCIN   ferrous sulfate 324 MG Tbec       TAKE these medications    acetaminophen 500 MG tablet Commonly known as: TYLENOL Take 1,000 mg by mouth every 6 (six) hours as needed for moderate pain or headache.   amantadine 100 MG capsule Commonly known as: SYMMETREL Take 100 mg by mouth 2 (two) times daily.   amitriptyline 25 MG tablet Commonly known as: ELAVIL TAKE 1 TO 2 TABLETS(25 TO 50 MG) BY MOUTH AT BEDTIME What changed: See the new instructions.   amLODipine 5 MG tablet Commonly known as: NORVASC Take 1 tablet (5 mg total) by mouth daily. What changed: when to take this   amphetamine-dextroamphetamine 20 MG 24 hr capsule Commonly known as: Adderall XR Take 1 capsule (20 mg total) by mouth daily.   apixaban 5 MG Tabs tablet Commonly known as: ELIQUIS Take 1 tablet (5 mg total) by mouth 2 (two) times daily.   atorvastatin 10 MG tablet Commonly known as: LIPITOR Take 10 mg by mouth daily.   baclofen 20 MG tablet Commonly known as: LIORESAL Take 1 tablet (20 mg total) by mouth 4 (four) times daily as needed for muscle spasms.   busPIRone 15 MG tablet Commonly known as: BUSPAR Take 1 tablet (15 mg total) by mouth 2 (two) times daily. Must keep follow up 09/21/20 for ongoing refills What changed: additional instructions   citalopram 20 MG tablet Commonly known as: CeleXA Take 1 tablet (20 mg total) by mouth daily.   dalfampridine 10 MG Tb12 Take 1 tablet (10 mg total) by mouth 2 (two) times daily.   folic acid 1 MG  tablet Commonly known as: FOLVITE Take 1 tablet (1 mg total) by mouth daily.   gabapentin 800 MG tablet Commonly known as: NEURONTIN Take 1 tablet (800 mg total) by mouth 4 (four) times daily.   metoprolol succinate 50 MG 24 hr tablet Commonly known as: TOPROL-XL Take 50 mg by mouth daily.   multivitamin with minerals Tabs tablet Take 1 tablet by mouth daily.   norethindrone 0.35 MG tablet Commonly known as: MICRONOR Take 1 tablet (0.35 mg total) by mouth daily.   omeprazole 40 MG capsule Commonly known as: PRILOSEC Take 40 mg by mouth daily.   SUMAtriptan 100 MG tablet Commonly known as: IMITREX TAKE 1 TABLET BY MOUTH 1 TIME FOR UP TO 1 DOSE AS NEEDED FOR MIGRAINE. MAY REPEAT IN 2 HOURS IF HEADACHE PERSISTS OR RECURS What changed:  how much to take how to take this when to take this reasons to take this additional instructions   tamsulosin 0.4 MG Caps capsule Commonly known as: FLOMAX TAKE 1 CAPSULE(0.4 MG) BY MOUTH DAILY What changed: See the new instructions.   tiZANidine 4 MG tablet Commonly known as: ZANAFLEX TAKE 1 TABLET BY MOUTH EVERY NIGHT AT BEDTIME   Zeposia 0.92 MG Caps Generic drug: Ozanimod HCl TAKE ONE CAPSULE (0.92MG )  BY MOUTH ONE TIME DAILY What changed:  how much to take how to take this when to take this additional instructions        Allergies  Allergen Reactions   Ace Inhibitors Swelling   Amoxicillin Itching    Did it involve swelling of the face/tongue/throat, SOB, or low BP? Yes Did it involve sudden or severe rash/hives, skin peeling, or any reaction on the inside of your mouth or nose? No Did you need to seek medical attention at a hospital or doctor's office? No When did it last happen?      unknown  If all above answers are NO, may proceed with cephalosporin use.;   Lisinopril Swelling    Lower lip swelling       Procedures/Studies: DG Chest Port 1 View  Result Date: 02/24/2021 CLINICAL DATA:  Dizziness over the  last 3 days. EXAM: PORTABLE CHEST 1 VIEW COMPARISON:  07/18/2020 FINDINGS: The lungs are clear without focal pneumonia, edema, pneumothorax or pleural effusion. Cardiopericardial silhouette is at upper limits of normal for size. The visualized bony structures of the thorax show no acute abnormality. Telemetry leads overlie the chest. IMPRESSION: No active disease. Electronically Signed   By: Misty Stanley M.D.   On: 02/24/2021 06:25      Subjective: Patient is feeling better, no nausea or vomiting, her lightheadedness and dizziness have improved, no chest pain or dyspnea   Discharge Exam: Vitals:   02/25/21 1945 02/26/21 0456  BP: 129/81 123/87  Pulse: (!) 109 89  Resp: 18 18  Temp: 98.4 F (36.9 C) 97.9 F (36.6 C)  SpO2: 100% 99%   Vitals:   02/25/21 1233 02/25/21 1653 02/25/21 1945 02/26/21 0456  BP: 119/81 126/86 129/81 123/87  Pulse: 90 92 (!) 109 89  Resp: 18 20 18 18   Temp: 98.7 F (37.1 C) 98.9 F (37.2 C) 98.4 F (36.9 C) 97.9 F (36.6 C)  TempSrc: Oral Oral Oral Oral  SpO2: 100% 100% 100% 99%  Weight:    88.5 kg  Height:        General: Not in pain or dyspnea,  Neurology: Awake and alert, non focal  E ENT: no pallor, no icterus, oral mucosa moist Cardiovascular: No JVD. S1-S2 present, rhythmic, no gallops, rubs, or murmurs. No lower extremity edema. Pulmonary: positive breath sounds bilaterally, with no wheezing, rhonchi or rales. Gastrointestinal. Abdomen soft and non tender Skin. No rashes Musculoskeletal: no joint deformities   The results of significant diagnostics from this hospitalization (including imaging, microbiology, ancillary and laboratory) are listed below for reference.     Microbiology: Recent Results (from the past 240 hour(s))  Resp Panel by RT-PCR (Flu A&B, Covid) Nasopharyngeal Swab     Status: None   Collection Time: 02/24/21  4:07 AM   Specimen: Nasopharyngeal Swab; Nasopharyngeal(NP) swabs in vial transport medium  Result Value Ref  Range Status   SARS Coronavirus 2 by RT PCR NEGATIVE NEGATIVE Final    Comment: (NOTE) SARS-CoV-2 target nucleic acids are NOT DETECTED.  The SARS-CoV-2 RNA is generally detectable  in upper respiratory specimens during the acute phase of infection. The lowest concentration of SARS-CoV-2 viral copies this assay can detect is 138 copies/mL. A negative result does not preclude SARS-Cov-2 infection and should not be used as the sole basis for treatment or other patient management decisions. A negative result may occur with  improper specimen collection/handling, submission of specimen other than nasopharyngeal swab, presence of viral mutation(s) within the areas targeted by this assay, and inadequate number of viral copies(<138 copies/mL). A negative result must be combined with clinical observations, patient history, and epidemiological information. The expected result is Negative.  Fact Sheet for Patients:  EntrepreneurPulse.com.au  Fact Sheet for Healthcare Providers:  IncredibleEmployment.be  This test is no t yet approved or cleared by the [place] FDA and  has been authorized for detection and/or diagnosis of SARS-CoV-2 by FDA under an Emergency Use Authorization (EUA). This EUA will remain  in effect (meaning this test can be used) for the duration of the COVID-19 declaration under Section 564(b)(1) of the Act, 21 U.S.C.section 360bbb-3(b)(1), unless the authorization is terminated  or revoked sooner.       Influenza A by PCR NEGATIVE NEGATIVE Final   Influenza B by PCR NEGATIVE NEGATIVE Final    Comment: (NOTE) The Xpert Xpress SARS-CoV-2/FLU/RSV plus assay is intended as an aid in the diagnosis of influenza from Nasopharyngeal swab specimens and should not be used as a sole basis for treatment. Nasal washings and aspirates are unacceptable for Xpert Xpress SARS-CoV-2/FLU/RSV testing.  Fact Sheet for  Patients: EntrepreneurPulse.com.au  Fact Sheet for Healthcare Providers: IncredibleEmployment.be  This test is not yet approved or cleared by the [place] FDA and has been authorized for detection and/or diagnosis of SARS-CoV-2 by FDA under an Emergency Use Authorization (EUA). This EUA will remain in effect (meaning this test can be used) for the duration of the COVID-19 declaration under Section 564(b)(1) of the Act, 21 U.S.C. section 360bbb-3(b)(1), unless the authorization is terminated or revoked.  Performed at [facility], [place] [address] [place], [place] [address]      Labs: BNP (last 3 results) No results for input(s): BNP in the last 8760 hours. Basic Metabolic Panel: Recent Labs  Lab 02/24/21 0151 02/24/21 0407 02/25/21 0608 02/26/21 0618  NA 139 139 141 138  K 3.5 3.5 3.5 3.4*  CL 106 106 108 106  CO2 27 26 25 24   GLUCOSE 100* 100* 111* 93  BUN 19 18 16 13   CREATININE 1.45* 1.44* 1.27* 1.29*  CALCIUM 8.6* 8.4* 8.4* 8.5*  MG  --  2.2  --   --    Liver Function Tests: Recent Labs  Lab 02/24/21 0151 02/24/21 0407  AST 20 19  ALT 25 24  ALKPHOS 112 104  BILITOT 0.7 0.6  PROT 7.3 6.6  ALBUMIN 4.0 3.7   No results for input(s): LIPASE, AMYLASE in the last 168 hours. No results for input(s): AMMONIA in the last 168 hours. CBC: Recent Labs  Lab 02/23/21 1206 02/24/21 0151 02/24/21 0407 02/25/21 0608 02/26/21 0618  WBC 3.8* 3.4* 3.1* 3.3* 3.5*  NEUTROABS 2.8 2.5 2.2  --   --   HGB 6.6* 6.6* 6.2* 7.7* 9.0*  HCT 19.4* 19.5* 18.7* 22.3* 25.7*  MCV 89.0 90.3 90.8 88.1 87.7  PLT 256 267 247 226 213   Cardiac Enzymes: No results for input(s): CKTOTAL, CKMB, CKMBINDEX, TROPONINI in the last 168 hours. BNP: Invalid input(s): POCBNP CBG: No results for input(s): GLUCAP in the last  168 hours. D-Dimer No results for input(s): DDIMER in the last 72 hours. Hgb A1c No results for input(s):  HGBA1C in the last 72 hours. Lipid Profile No results for input(s): CHOL, HDL, LDLCALC, TRIG, CHOLHDL, LDLDIRECT in the last 72 hours. Thyroid function studies No results for input(s): TSH, T4TOTAL, T3FREE, THYROIDAB in the last 72 hours.  Invalid input(s): FREET3 Anemia work up Recent Labs    02/24/21 0407  RETICCTPCT 0.4   Urinalysis    Component Value Date/Time   COLORURINE YELLOW 02/24/2021 0624   APPEARANCEUR HAZY (A) 02/24/2021 0624   APPEARANCEUR Clear 02/19/2019 1710   LABSPEC 1.024 02/24/2021 0624   PHURINE 5.0 02/24/2021 0624   GLUCOSEU NEGATIVE 02/24/2021 0624   HGBUR NEGATIVE 02/24/2021 0624   BILIRUBINUR NEGATIVE 02/24/2021 0624   BILIRUBINUR Negative 02/19/2019 1710   KETONESUR 5 (A) 02/24/2021 0624   PROTEINUR NEGATIVE 02/24/2021 0624   UROBILINOGEN 0.2 12/15/2016 1136   NITRITE NEGATIVE 02/24/2021 0624   LEUKOCYTESUR NEGATIVE 02/24/2021 0624   Sepsis Labs Invalid input(s): PROCALCITONIN,  WBC,  LACTICIDVEN Microbiology Recent Results (from the past 240 hour(s))  Resp Panel by RT-PCR (Flu A&B, Covid) Nasopharyngeal Swab     Status: None   Collection Time: 02/24/21  4:07 AM   Specimen: Nasopharyngeal Swab; Nasopharyngeal(NP) swabs in vial transport medium  Result Value Ref Range Status   SARS Coronavirus 2 by RT PCR NEGATIVE NEGATIVE Final    Comment: (NOTE) SARS-CoV-2 target nucleic acids are NOT DETECTED.  The SARS-CoV-2 RNA is generally detectable in upper respiratory specimens during the acute phase of infection. The lowest concentration of SARS-CoV-2 viral copies this assay can detect is 138 copies/mL. A negative result does not preclude SARS-Cov-2 infection and should not be used as the sole basis for treatment or other patient management decisions. A negative result may occur with  improper specimen collection/handling, submission of specimen other than nasopharyngeal swab, presence of viral mutation(s) within the areas targeted by this assay,  and inadequate number of viral copies(<138 copies/mL). A negative result must be combined with clinical observations, patient history, and epidemiological information. The expected result is Negative.  Fact Sheet for Patients:  EntrepreneurPulse.com.au  Fact Sheet for Healthcare Providers:  IncredibleEmployment.be  This test is no t yet approved or cleared by the [place] FDA and  has been authorized for detection and/or diagnosis of SARS-CoV-2 by FDA under an Emergency Use Authorization (EUA). This EUA will remain  in effect (meaning this test can be used) for the duration of the COVID-19 declaration under Section 564(b)(1) of the Act, 21 U.S.C.section 360bbb-3(b)(1), unless the authorization is terminated  or revoked sooner.       Influenza A by PCR NEGATIVE NEGATIVE Final   Influenza B by PCR NEGATIVE NEGATIVE Final    Comment: (NOTE) The Xpert Xpress SARS-CoV-2/FLU/RSV plus assay is intended as an aid in the diagnosis of influenza from Nasopharyngeal swab specimens and should not be used as a sole basis for treatment. Nasal washings and aspirates are unacceptable for Xpert Xpress SARS-CoV-2/FLU/RSV testing.  Fact Sheet for Patients: EntrepreneurPulse.com.au  Fact Sheet for Healthcare Providers: IncredibleEmployment.be  This test is not yet approved or cleared by the [place] FDA and has been authorized for detection and/or diagnosis of SARS-CoV-2 by FDA under an Emergency Use Authorization (EUA). This EUA will remain in effect (meaning this test can be used) for the duration of the COVID-19 declaration under Section 564(b)(1) of the Act, 21 U.S.C. section 360bbb-3(b)(1), unless the authorization is terminated or  revoked.  Performed at [facility], [place] [address], [place], [place] [address]      Time coordinating discharge: 45 minutes  SIGNED:   Tawni Millers, MD  [facility] 02/26/2021, 11:00 AM

## 2021-02-28 LAB — BPAM RBC
Blood Product Expiration Date: 202301212359
Blood Product Expiration Date: 202301232359
Blood Product Expiration Date: 202301232359
Blood Product Expiration Date: 202301282359
ISSUE DATE / TIME: 202212211027
ISSUE DATE / TIME: 202212221003
Unit Type and Rh: 5100
Unit Type and Rh: 5100
Unit Type and Rh: 7300
Unit Type and Rh: 7300

## 2021-02-28 LAB — TYPE AND SCREEN
ABO/RH(D): B POS
Antibody Screen: POSITIVE
DAT, IgG: POSITIVE
Unit division: 0
Unit division: 0
Unit division: 0
Unit division: 0

## 2021-03-04 ENCOUNTER — Encounter (HOSPITAL_BASED_OUTPATIENT_CLINIC_OR_DEPARTMENT_OTHER): Payer: Medicare Other | Attending: Internal Medicine | Admitting: Internal Medicine

## 2021-03-05 ENCOUNTER — Inpatient Hospital Stay (HOSPITAL_BASED_OUTPATIENT_CLINIC_OR_DEPARTMENT_OTHER): Payer: Medicare Other | Admitting: Oncology

## 2021-03-05 ENCOUNTER — Inpatient Hospital Stay: Payer: Medicare Other

## 2021-03-05 ENCOUNTER — Inpatient Hospital Stay: Payer: Medicare Other | Admitting: Oncology

## 2021-03-05 ENCOUNTER — Other Ambulatory Visit: Payer: Self-pay

## 2021-03-05 ENCOUNTER — Ambulatory Visit: Payer: Medicare Other

## 2021-03-05 ENCOUNTER — Telehealth: Payer: Self-pay | Admitting: *Deleted

## 2021-03-05 VITALS — BP 119/76 | HR 96 | Temp 97.7°F | Ht 66.0 in | Wt 201.8 lb

## 2021-03-05 DIAGNOSIS — D649 Anemia, unspecified: Secondary | ICD-10-CM | POA: Diagnosis not present

## 2021-03-05 DIAGNOSIS — N183 Chronic kidney disease, stage 3 unspecified: Secondary | ICD-10-CM

## 2021-03-05 DIAGNOSIS — D631 Anemia in chronic kidney disease: Secondary | ICD-10-CM | POA: Diagnosis present

## 2021-03-05 LAB — CBC WITH DIFFERENTIAL (CANCER CENTER ONLY)
Abs Immature Granulocytes: 0.14 10*3/uL — ABNORMAL HIGH (ref 0.00–0.07)
Basophils Absolute: 0 10*3/uL (ref 0.0–0.1)
Basophils Relative: 0 %
Eosinophils Absolute: 0.1 10*3/uL (ref 0.0–0.5)
Eosinophils Relative: 4 %
HCT: 24.3 % — ABNORMAL LOW (ref 36.0–46.0)
Hemoglobin: 8.4 g/dL — ABNORMAL LOW (ref 12.0–15.0)
Immature Granulocytes: 5 %
Lymphocytes Relative: 8 %
Lymphs Abs: 0.3 10*3/uL — ABNORMAL LOW (ref 0.7–4.0)
MCH: 30.2 pg (ref 26.0–34.0)
MCHC: 34.6 g/dL (ref 30.0–36.0)
MCV: 87.4 fL (ref 80.0–100.0)
Monocytes Absolute: 0.6 10*3/uL (ref 0.1–1.0)
Monocytes Relative: 21 %
Neutro Abs: 1.9 10*3/uL (ref 1.7–7.7)
Neutrophils Relative %: 62 %
Platelet Count: 254 10*3/uL (ref 150–400)
RBC: 2.78 MIL/uL — ABNORMAL LOW (ref 3.87–5.11)
RDW: 13.4 % (ref 11.5–15.5)
WBC Count: 3.1 10*3/uL — ABNORMAL LOW (ref 4.0–10.5)
nRBC: 0 % (ref 0.0–0.2)

## 2021-03-05 LAB — SAMPLE TO BLOOD BANK

## 2021-03-05 MED ORDER — EPOETIN ALFA-EPBX 20000 UNIT/ML IJ SOLN
20000.0000 [IU] | Freq: Once | INTRAMUSCULAR | Status: AC
  Administered 2021-03-05: 14:00:00 20000 [IU] via SUBCUTANEOUS
  Filled 2021-03-05: qty 1

## 2021-03-05 NOTE — Telephone Encounter (Signed)
PC to patient, no answer, left VM - informed her a lab appointment was added on before her MD appointment this morning.  Lab appointment is at 9:00, MD appointment 9:30.  Patient did now show for her appointments, called again, unable to leave message - voice mailbox is full.

## 2021-03-05 NOTE — Progress Notes (Signed)
Hematology and Oncology Follow Up Visit  Dawn Peters [id number] 1980-07-22 40 y.o. 03/05/2021 12:53 PM Dawn Cosier, NP   Principle Diagnosis: 40 year old with anemia diagnosed in September 2022.  She was found to have normocytic, normochromic and hypoproliferative component.  Prior work-up: Bone marrow biopsy obtained on 11/23/2020 showed hypercellular marrow with trilineage hematopoiesis and fibrosis.   NGS for JAK2, MPL and CALR showed no detectable genetic alteration on December 16, 2020.   Current therapy:  Retacrit 10,000 units started on December 11, 2020.  This is planned to be given every 3 weeks.  Packed red cell transfusion as needed.    Interim History: Dawn Peters returns today for a follow-up visit.  Since her last visit, she was hospitalized between December 28 and December 23 for symptomatic anemia after reporting symptoms of dizziness and lightheadedness.  He was found to have hemoglobin of 6.2 and received packed red cell transfusion with improvement in her hemoglobin up to 9.  Since that time, she reports of feeling reasonably well without any complaints.  She denies any headaches or dizziness.  She denies any lightheadedness or shortness of breath.  He denies any hematochezia or melena.     Medications: Updated on review. Current Outpatient Medications  Medication Sig Dispense Refill   acetaminophen (TYLENOL) 500 MG tablet Take 1,000 mg by mouth every 6 (six) hours as needed for moderate pain or headache.     amantadine (SYMMETREL) 100 MG capsule Take 100 mg by mouth 2 (two) times daily.     amitriptyline (ELAVIL) 25 MG tablet TAKE 1 TO 2 TABLETS(25 TO 50 MG) BY MOUTH AT BEDTIME (Patient taking differently: Take 50 mg by mouth at bedtime.) 180 tablet 1   amLODipine (NORVASC) 5 MG tablet Take 1 tablet (5 mg total) by mouth daily. (Patient taking differently: Take 5 mg by mouth daily after breakfast.) 90 tablet 0   amphetamine-dextroamphetamine  (ADDERALL XR) 20 MG 24 hr capsule Take 1 capsule (20 mg total) by mouth daily. 30 capsule 0   apixaban (ELIQUIS) 5 MG TABS tablet Take 1 tablet (5 mg total) by mouth 2 (two) times daily. (Patient not taking: Reported on 02/24/2021) 60 tablet 5   atorvastatin (LIPITOR) 10 MG tablet Take 10 mg by mouth daily.     baclofen (LIORESAL) 20 MG tablet Take 1 tablet (20 mg total) by mouth 4 (four) times daily as needed for muscle spasms. 360 tablet 3   busPIRone (BUSPAR) 15 MG tablet Take 1 tablet (15 mg total) by mouth 2 (two) times daily. Must keep follow up 09/21/20 for ongoing refills (Patient taking differently: Take 15 mg by mouth 2 (two) times daily.) 60 tablet 1   citalopram (CELEXA) 20 MG tablet Take 1 tablet (20 mg total) by mouth daily. 30 tablet 11   dalfampridine 10 MG TB12 Take 1 tablet (10 mg total) by mouth 2 (two) times daily. 60 tablet 11   folic acid (FOLVITE) 1 MG tablet Take 1 tablet (1 mg total) by mouth daily. 30 tablet 0   gabapentin (NEURONTIN) 800 MG tablet Take 1 tablet (800 mg total) by mouth 4 (four) times daily. 120 tablet 11   metoprolol succinate (TOPROL-XL) 50 MG 24 hr tablet Take 50 mg by mouth daily.     Multiple Vitamin (MULTIVITAMIN WITH MINERALS) TABS tablet Take 1 tablet by mouth daily. 30 tablet 0   norethindrone (MICRONOR) 0.35 MG tablet Take 1 tablet (0.35 mg total) by mouth daily. 1 Package 11  omeprazole (PRILOSEC) 40 MG capsule Take 40 mg by mouth daily.     Ozanimod HCl (ZEPOSIA) 0.92 MG CAPS TAKE ONE CAPSULE (0.92MG)  BY MOUTH ONE TIME DAILY (Patient taking differently: Take 0.92 mg by mouth daily.) 30 capsule 11   SUMAtriptan (IMITREX) 100 MG tablet TAKE 1 TABLET BY MOUTH 1 TIME FOR UP TO 1 DOSE AS NEEDED FOR MIGRAINE. MAY REPEAT IN 2 HOURS IF HEADACHE PERSISTS OR RECURS (Patient taking differently: Take 100 mg by mouth daily as needed for migraine or headache.) 10 tablet 5   tamsulosin (FLOMAX) 0.4 MG CAPS capsule TAKE 1 CAPSULE(0.4 MG) BY MOUTH DAILY (Patient  taking differently: Take 0.4 mg by mouth daily.) 30 capsule 11   tiZANidine (ZANAFLEX) 4 MG tablet TAKE 1 TABLET BY MOUTH EVERY NIGHT AT BEDTIME (Patient taking differently: Take 4 mg by mouth at bedtime.) 30 tablet 10   No current facility-administered medications for this visit.   Facility-Administered Medications Ordered in Other Visits  Medication Dose Route Frequency Provider Last Rate Last Admin   acetaminophen (TYLENOL) 325 MG tablet            diphenhydrAMINE (BENADRYL) 25 mg capsule              Allergies:  Allergies  Allergen Reactions   Ace Inhibitors Swelling   Amoxicillin Itching    Did it involve swelling of the face/tongue/throat, SOB, or low BP? Yes Did it involve sudden or severe rash/hives, skin peeling, or any reaction on the inside of your mouth or nose? No Did you need to seek medical attention at a hospital or doctor's office? No When did it last happen?      unknown  If all above answers are NO, may proceed with cephalosporin use.;   Lisinopril Swelling    Lower lip swelling      Physical Exam: Blood pressure 119/76, pulse 96, temperature 97.7 F (36.5 C), temperature source Tympanic, height 5' 6" (1.676 m), weight 201 lb 12.8 oz (91.5 kg), last menstrual period 02/03/2021, SpO2 100 %.  ECOG:  1   General appearance: Alert, awake without any distress. Head: Atraumatic without abnormalities Oropharynx: Without any thrush or ulcers. Eyes: No scleral icterus. Lymph nodes: No lymphadenopathy noted in the cervical, supraclavicular, or axillary nodes Heart:regular rate and rhythm, without any murmurs or gallops.   Lung: Clear to auscultation without any rhonchi, wheezes or dullness to percussion. Abdomin: Soft, nontender without any shifting dullness or ascites. Musculoskeletal: No clubbing or cyanosis. Neurological: No motor or sensory deficits. Skin: No rashes or lesions.     Lab Results: Lab Results  Component Value Date   WBC 3.5 (L)  02/26/2021   HGB 9.0 (L) 02/26/2021   HCT 25.7 (L) 02/26/2021   MCV 87.7 02/26/2021   PLT 213 02/26/2021     Chemistry      Component Value Date/Time   NA 138 02/26/2021 0618   NA 144 06/21/2017 1626   K 3.4 (L) 02/26/2021 0618   CL 106 02/26/2021 0618   CO2 24 02/26/2021 0618   BUN 13 02/26/2021 0618   BUN 16 06/21/2017 1626   CREATININE 1.29 (H) 02/26/2021 0618   CREATININE 0.85 07/03/2014 0959      Component Value Date/Time   CALCIUM 8.5 (L) 02/26/2021 0618   ALKPHOS 104 02/24/2021 0407   AST 19 02/24/2021 0407   ALT 24 02/24/2021 0407   BILITOT 0.6 02/24/2021 0407   BILITOT 0.5 01/11/2018 1542  Impression and Plan:  40 year old woman with:  1.  Anemia diagnosed in September 2022.  She was found to have hypoproliferative component without any clear-cut etiology identified on her bone marrow biopsy.  She is currently on her Retacrit 10,000 unit every 3 weeks although she has missed multiple doses.  She continues to be transfusion dependent at this time.  Risks and benefits of continuing this treatment and potentially increasing the dose of Retacrit were discussed at this time.  She is agreeable to proceed.  Hemoglobin today is 8.4 and appears adequate and will proceed with injection only without transfusion.  2.  Multiple sclerosis: No recent flares noted at this time.  She is off steroids at this time.  3.  Recurrent venous thromboembolism diagnosed in 2014.  She continues to be on Eliquis without any recent bleeding or complications.  4.  Chronic renal sufficiency: Her creatinine clearance remains around 50 cc/min.  We will continue to monitor.  5.  Follow-up: She will continue to follow every 3 weeks for Retacrit and MD follow-up in 9 weeks.  30  minutes were spent on this encounter.  The time was dedicated to reviewing laboratory data, disease status update, treatment choices and future plan of care discussion.   Zola Button, MD 12/30/202212:53  PM

## 2021-03-09 ENCOUNTER — Telehealth: Payer: Self-pay | Admitting: Oncology

## 2021-03-09 NOTE — Telephone Encounter (Signed)
Scheduled per 12/30 los, message has been left with pt

## 2021-03-12 ENCOUNTER — Encounter: Payer: Self-pay | Admitting: *Deleted

## 2021-03-12 ENCOUNTER — Other Ambulatory Visit: Payer: Self-pay | Admitting: Neurology

## 2021-03-12 MED ORDER — BACLOFEN 10 MG PO TABS: 10.0000 mg | ORAL_TABLET | Freq: Four times a day (QID) | ORAL | 1 refills | Status: DC

## 2021-03-15 ENCOUNTER — Telehealth: Payer: Self-pay | Admitting: *Deleted

## 2021-03-15 NOTE — Telephone Encounter (Signed)
LM for patient to return call. Called son to see if he has spoken with Candy. He states she is with his grandmother, and he will call her know. Advised that we wanted to make sure she is OK.

## 2021-03-15 NOTE — Telephone Encounter (Signed)
Received [facility] message stating patient wanted to be seen today.   Attempted to call patient X 2- LM for patient to return call.

## 2021-03-15 NOTE — Telephone Encounter (Signed)
Called pt back. Relayed message from Dr. Felecia Shelling re: baclofen. She verbalized understanding. She wanted update on pain referral. Original referral sent in November: told she could not be seen until March. She requested another referral. Looks like she was sent to [facility] 02/10/21 but she states they denied, she can no longer go there for pain management. She thinks she spoke w/ Raquel Sarna and asked she be referred to [facility]. She states she spoke w/ [facility] who confirmed they have referral but waiting on something from our office. Advised I do not see any notes on this but I will send back to referral coordinator to look further into this. She verbalized understanding.

## 2021-03-16 ENCOUNTER — Emergency Department (HOSPITAL_COMMUNITY)
Admission: EM | Admit: 2021-03-16 | Discharge: 2021-03-16 | Disposition: A | Discharge status: Home / Self Care | Payer: Medicare Other | Attending: Emergency Medicine | Admitting: Emergency Medicine

## 2021-03-16 ENCOUNTER — Emergency Department (HOSPITAL_COMMUNITY): Payer: Medicare Other

## 2021-03-16 ENCOUNTER — Encounter (HOSPITAL_COMMUNITY): Payer: Self-pay

## 2021-03-16 ENCOUNTER — Other Ambulatory Visit: Payer: Self-pay

## 2021-03-16 DIAGNOSIS — D649 Anemia, unspecified: Secondary | ICD-10-CM

## 2021-03-16 DIAGNOSIS — Z79899 Other long term (current) drug therapy: Secondary | ICD-10-CM | POA: Diagnosis not present

## 2021-03-16 DIAGNOSIS — Z7901 Long term (current) use of anticoagulants: Secondary | ICD-10-CM | POA: Diagnosis not present

## 2021-03-16 DIAGNOSIS — I129 Hypertensive chronic kidney disease with stage 1 through stage 4 chronic kidney disease, or unspecified chronic kidney disease: Secondary | ICD-10-CM | POA: Insufficient documentation

## 2021-03-16 DIAGNOSIS — N183 Chronic kidney disease, stage 3 unspecified: Secondary | ICD-10-CM | POA: Insufficient documentation

## 2021-03-16 DIAGNOSIS — R42 Dizziness and giddiness: Secondary | ICD-10-CM | POA: Diagnosis present

## 2021-03-16 LAB — PROTIME-INR
INR: 0.9 (ref 0.8–1.2)
Prothrombin Time: 12.4 seconds (ref 11.4–15.2)

## 2021-03-16 LAB — URINALYSIS, ROUTINE W REFLEX MICROSCOPIC
Bilirubin Urine: NEGATIVE
Glucose, UA: NEGATIVE mg/dL
Ketones, ur: NEGATIVE mg/dL
Leukocytes,Ua: NEGATIVE
Nitrite: NEGATIVE
Protein, ur: NEGATIVE mg/dL
Specific Gravity, Urine: 1.017 (ref 1.005–1.030)
pH: 5 (ref 5.0–8.0)

## 2021-03-16 LAB — CBC WITH DIFFERENTIAL/PLATELET
Abs Immature Granulocytes: 0.05 10*3/uL (ref 0.00–0.07)
Basophils Absolute: 0 10*3/uL (ref 0.0–0.1)
Basophils Relative: 1 %
Eosinophils Absolute: 0 10*3/uL (ref 0.0–0.5)
Eosinophils Relative: 2 %
HCT: 21 % — ABNORMAL LOW (ref 36.0–46.0)
Hemoglobin: 7.2 g/dL — ABNORMAL LOW (ref 12.0–15.0)
Immature Granulocytes: 2 %
Lymphocytes Relative: 8 %
Lymphs Abs: 0.2 10*3/uL — ABNORMAL LOW (ref 0.7–4.0)
MCH: 31.3 pg (ref 26.0–34.0)
MCHC: 34.3 g/dL (ref 30.0–36.0)
MCV: 91.3 fL (ref 80.0–100.0)
Monocytes Absolute: 0.5 10*3/uL (ref 0.1–1.0)
Monocytes Relative: 24 %
Neutro Abs: 1.3 10*3/uL — ABNORMAL LOW (ref 1.7–7.7)
Neutrophils Relative %: 63 %
Platelets: 247 10*3/uL (ref 150–400)
RBC: 2.3 MIL/uL — ABNORMAL LOW (ref 3.87–5.11)
RDW: 13.5 % (ref 11.5–15.5)
WBC: 2.1 10*3/uL — ABNORMAL LOW (ref 4.0–10.5)
nRBC: 0 % (ref 0.0–0.2)

## 2021-03-16 LAB — COMPREHENSIVE METABOLIC PANEL
ALT: 20 U/L (ref 0–44)
AST: 18 U/L (ref 15–41)
Albumin: 3.8 g/dL (ref 3.5–5.0)
Alkaline Phosphatase: 113 U/L (ref 38–126)
Anion gap: 3 — ABNORMAL LOW (ref 5–15)
BUN: 19 mg/dL (ref 6–20)
CO2: 28 mmol/L (ref 22–32)
Calcium: 8.6 mg/dL — ABNORMAL LOW (ref 8.9–10.3)
Chloride: 107 mmol/L (ref 98–111)
Creatinine, Ser: 1.5 mg/dL — ABNORMAL HIGH (ref 0.44–1.00)
GFR, Estimated: 44 mL/min — ABNORMAL LOW (ref >60)
Glucose, Bld: 98 mg/dL (ref 70–99)
Potassium: 3.3 mmol/L — ABNORMAL LOW (ref 3.5–5.1)
Sodium: 138 mmol/L (ref 135–145)
Total Bilirubin: 0.6 mg/dL (ref 0.3–1.2)
Total Protein: 6.8 g/dL (ref 6.5–8.1)

## 2021-03-16 LAB — TROPONIN I (HIGH SENSITIVITY)
Troponin I (High Sensitivity): 3 ng/L (ref <18)
Troponin I (High Sensitivity): 2 ng/L (ref <18)

## 2021-03-16 LAB — PREPARE RBC (CROSSMATCH)

## 2021-03-16 LAB — I-STAT BETA HCG BLOOD, ED (MC, WL, AP ONLY): I-stat hCG, quantitative: <5 m[IU]/mL (ref <5)

## 2021-03-16 LAB — D-DIMER, QUANTITATIVE: D-Dimer, Quant: 0.33 ug/mL-FEU (ref 0.00–0.50)

## 2021-03-16 MED ORDER — SODIUM CHLORIDE 0.9 % IV SOLN
10.0000 mL/h | Freq: Once | INTRAVENOUS | Status: AC
Start: 2021-03-16 — End: 2021-03-16
  Administered 2021-03-16: 10 mL/h via INTRAVENOUS

## 2021-03-16 MED ORDER — POTASSIUM CHLORIDE CRYS ER 20 MEQ PO TBCR
40.0000 meq | EXTENDED_RELEASE_TABLET | Freq: Once | ORAL | Status: AC
  Administered 2021-03-16: 40 meq via ORAL
  Filled 2021-03-16: qty 2

## 2021-03-16 MED ORDER — METOCLOPRAMIDE HCL 5 MG/ML IJ SOLN
5.0000 mg | Freq: Once | INTRAMUSCULAR | Status: AC
  Administered 2021-03-16: 5 mg via INTRAVENOUS
  Filled 2021-03-16: qty 2

## 2021-03-16 MED ORDER — KETOROLAC TROMETHAMINE 30 MG/ML IJ SOLN
30.0000 mg | Freq: Once | INTRAMUSCULAR | Status: AC
  Administered 2021-03-16: 30 mg via INTRAVENOUS
  Filled 2021-03-16: qty 1

## 2021-03-16 MED ORDER — SODIUM CHLORIDE 0.9 % IV BOLUS
1000.0000 mL | Freq: Once | INTRAVENOUS | Status: AC
  Administered 2021-03-16: 1000 mL via INTRAVENOUS

## 2021-03-16 MED ORDER — MAGNESIUM SULFATE 2 GM/50ML IV SOLN
2.0000 g | Freq: Once | INTRAVENOUS | Status: AC
  Administered 2021-03-16: 2 g via INTRAVENOUS
  Filled 2021-03-16: qty 50

## 2021-03-16 MED ORDER — DIPHENHYDRAMINE HCL 50 MG/ML IJ SOLN
25.0000 mg | Freq: Once | INTRAMUSCULAR | Status: AC
  Administered 2021-03-16: 25 mg via INTRAVENOUS
  Filled 2021-03-16: qty 1

## 2021-03-16 NOTE — ED Notes (Signed)
Writer attempt x2 to collect labs. Unsuccessful

## 2021-03-16 NOTE — ED Provider Notes (Signed)
Pt signed out by Dr. Pearline Cables pending blood transfusion.  Pt has received a unit of blood.  She had no problems with the transfusion.  She is stable for d/c.  She is to return if worse.  F/u with pcp and with heme-onc.   Isla Pence, MD 03/16/21 1049

## 2021-03-16 NOTE — ED Notes (Signed)
Pt in bed, sig other at bedside, pt denies pain, states that she is ready to go home, pt denies itchiness or sob, d/c pt iv, cath intact, pt verbalized understanding d/c instructions and follow up, pt ambulatory from dpt.

## 2021-03-16 NOTE — Discharge Instructions (Addendum)
Please return to emergency department for any worsening or worrisome symptoms.

## 2021-03-16 NOTE — ED Notes (Signed)
Lab is adding on a urine culture

## 2021-03-16 NOTE — ED Triage Notes (Signed)
Pt reports with headache and dizziness since Friday. Pt states that when she gets like this her hemoglobin is low.

## 2021-03-16 NOTE — ED Notes (Signed)
Pt in bed, pt denies itchiness, or sob, pt offers no complaints, increased rate to 275 per md instructions to infuse unit over one hour.

## 2021-03-16 NOTE — ED Provider Notes (Signed)
[facility] DEPT Provider Note   CSN: [id number] Arrival date & time: 03/16/21  0056     History  Chief Complaint  Patient presents with   Headache   Dizziness    Dawn Peters is a 41 y.o. female.  This is a 41 y.o. 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  Concern for possible low hemoglobin.  Patient with history of multiple blood transfusion in the past.  She reports that typically she experiences a similar symptoms when her blood counts become low.  Over the past 4 days she has been having intermittent headaches, lightheadedness, presyncope.  Reports on Friday she was at the [facility] when she did experience syncope, she felt lightheaded, diaphoretic, fell to the ground.  No seizure activity, no incontinence.  No postictal period, ambulatory following the event.  No chest pain or palpitations.  No nausea or vomiting.  Symptoms have not recurred since then.  She has experienced the symptoms in the past with prior episodes of anemia.   She is on DOAC for history of prior PE, DVT.  Follows with hematology.   Patient currently experiencing headache, similar to prior migraines, she did not take her Imitrex today.  Aching, pressure sensation.  Frontal.  Numbness or tingling, no vision or hearing changes.  No gait disturbance.  No loss of bowel or bladder control.  No melanotic stool or blood per rectum, no hematemesis, no increased bruising.  The history is provided by the patient. No language interpreter was used.  Headache Associated symptoms: dizziness and weakness   Associated symptoms: no abdominal pain, no back pain, no cough, no fever and no nausea   Dizziness Associated symptoms: headaches and weakness   Associated symptoms: no chest pain, no nausea, no palpitations and no shortness of  breath      Patient Active Problem List   Diagnosis Date Noted   AKI (acute kidney injury) ([place]) 02/24/2021   HLD (hyperlipidemia) 02/24/2021   Hypoproliferative anemia ([place]) 91/69/4503   Folic acid deficiency 88/82/8003   Acute on chronic anemia 02/08/2021   Symptomatic anemia 11/21/2020   Amenorrhea 11/20/2018   Well woman exam 11/20/2018   MS (multiple sclerosis) ([place]) 03/14/2018   Stage 3 chronic kidney disease ([place])    Obesity (BMI 30.0-34.9)    Anemia 01/09/2018   Pulmonary embolism ([place]) 09/18/2017   Elevated serum creatinine 05/12/2017   Acute encephalopathy 04/20/2017   Hydronephrosis of left kidney 10/24/2016   Attention deficit 10/24/2016   Left leg weakness 08/13/2016   Chronic pain 08/13/2016   Mild renal insufficiency 08/13/2016   Left-sided weakness    High risk medication use 09/15/2015   Hip pain, bilateral 07/28/2015   Urinary hesitancy 07/28/2015   Multiple sclerosis exacerbation ([place]) 07/17/2015   Urinary disorder 06/11/2015   Gait disturbance 05/19/2015   Numbness 05/19/2015   Depression with anxiety 05/13/2015   Other fatigue 05/13/2015   Left knee pain 05/13/2015   Avascular necrosis of bones of both hips ([place]) 05/13/2015   Screening for HIV (human immunodeficiency virus) 07/08/2014   Essential hypertension, benign 11/19/2013   Anxiety state, unspecified 11/19/2013   Screen for STD (sexually transmitted disease) 11/01/2013   Encounter for routine gynecological examination 11/01/2013   Oral contraceptive use 10/20/2011   Multiple sclerosis ([place]) 08/08/2007   RASH AND OTHER NONSPECIFIC SKIN ERUPTION 06/22/2007   AVASCULAR NECROSIS, FEMORAL  HEAD 03/11/2006   Essential hypertension 02/03/2006   MICROALBUMINURIA 02/03/2006   DVT, HX OF 02/03/2006     Home Medications Prior to Admission medications   Medication Sig Start Date End Date Taking? Authorizing Provider  acetaminophen (TYLENOL) 500 MG tablet Take 1,000 mg by mouth every 6 (six) hours  as needed for moderate pain or headache.   Yes [provider]  amantadine (SYMMETREL) 100 MG capsule Take 100 mg by mouth 2 (two) times daily. 09/27/20  Yes [provider]  amitriptyline (ELAVIL) 25 MG tablet TAKE 1 TO 2 TABLETS(25 TO 50 MG) BY MOUTH AT BEDTIME Patient taking differently: Take 50 mg by mouth at bedtime. 11/17/20  Yes Sater, Nanine Means, MD  amLODipine (NORVASC) 5 MG tablet Take 1 tablet (5 mg total) by mouth daily. Patient taking differently: Take 5 mg by mouth daily after breakfast. 05/21/17  Yes Arrien, Jimmy Picket, MD  amphetamine-dextroamphetamine (ADDERALL XR) 20 MG 24 hr capsule Take 1 capsule (20 mg total) by mouth daily. 02/04/21  Yes Sater, Nanine Means, MD  atorvastatin (LIPITOR) 10 MG tablet Take 10 mg by mouth daily. 12/24/20  Yes [provider]  baclofen (LIORESAL) 10 MG tablet Take 1 tablet (10 mg total) by mouth 4 (four) times daily. 03/12/21  Yes Sater, Nanine Means, MD  busPIRone (BUSPAR) 15 MG tablet Take 1 tablet (15 mg total) by mouth 2 (two) times daily. Must keep follow up 09/21/20 for ongoing refills Patient taking differently: Take 15 mg by mouth 2 (two) times daily. 08/18/20  Yes Sater, Nanine Means, MD  citalopram (CELEXA) 20 MG tablet Take 1 tablet (20 mg total) by mouth daily. 02/19/19  Yes Sater, Nanine Means, MD  dalfampridine 10 MG TB12 Take 1 tablet (10 mg total) by mouth 2 (two) times daily. 09/21/20  Yes Sater, Nanine Means, MD  gabapentin (NEURONTIN) 800 MG tablet Take 1 tablet (800 mg total) by mouth 4 (four) times daily. 09/21/20  Yes Sater, Nanine Means, MD  JARDIANCE 10 MG TABS tablet Take 10 mg by mouth daily. 03/14/21  Yes [provider]  LINZESS 72 MCG capsule Take 72 mcg by mouth every morning. 02/18/21  Yes [provider]  metoprolol succinate (TOPROL-XL) 50 MG 24 hr tablet Take 50 mg by mouth daily. 09/27/20  Yes [provider]  Multiple Vitamin (MULTIVITAMIN WITH MINERALS) TABS tablet Take 1 tablet by  mouth daily. 09/20/17  Yes Kayleen Memos, DO  norethindrone (MICRONOR) 0.35 MG tablet Take 1 tablet (0.35 mg total) by mouth daily. 11/19/18  Yes Clarnce Flock, MD  omeprazole (PRILOSEC) 40 MG capsule Take 40 mg by mouth daily. 09/27/20  Yes [provider]  Ozanimod HCl (ZEPOSIA) 0.92 MG CAPS TAKE ONE CAPSULE (0.92MG )  BY MOUTH ONE TIME DAILY Patient taking differently: Take 0.92 mg by mouth daily. 03/09/20  Yes Sater, Nanine Means, MD  SUMAtriptan (IMITREX) 100 MG tablet TAKE 1 TABLET BY MOUTH 1 TIME FOR UP TO 1 DOSE AS NEEDED FOR MIGRAINE. MAY REPEAT IN 2 HOURS IF HEADACHE PERSISTS OR RECURS Patient taking differently: Take 100 mg by mouth daily as needed for migraine or headache. 09/21/20  Yes Sater, Nanine Means, MD  tamsulosin (FLOMAX) 0.4 MG CAPS capsule TAKE 1 CAPSULE(0.4 MG) BY MOUTH DAILY Patient taking differently: Take 0.4 mg by mouth daily. 08/05/20  Yes Sater, Nanine Means, MD  tiZANidine (ZANAFLEX) 4 MG tablet TAKE 1 TABLET BY MOUTH EVERY NIGHT AT BEDTIME Patient taking differently: Take 4 mg by mouth at  bedtime. 08/05/20  Yes Sater, Nanine Means, MD  apixaban (ELIQUIS) 5 MG TABS tablet Take 1 tablet (5 mg total) by mouth 2 (two) times daily. Patient not taking: Reported on 03/16/2021 09/21/20   Sater, Nanine Means, MD  Oxcarbazepine (TRILEPTAL) 300 MG tablet Take 1 tablet (300 mg total) by mouth 2 (two) times daily. Patient not taking: Reported on 07/19/2019 11/11/15 07/19/19  Britt Bottom, MD      Allergies    Ace inhibitors, Amoxicillin, and Lisinopril    Review of Systems   Review of Systems  Constitutional:  Negative for activity change and fever.  HENT:  Negative for facial swelling and trouble swallowing.   Eyes:  Negative for discharge and redness.  Respiratory:  Negative for cough and shortness of breath.   Cardiovascular:  Negative for chest pain and palpitations.  Gastrointestinal:  Negative for abdominal pain and nausea.  Genitourinary:  Negative for dysuria and flank pain.   Musculoskeletal:  Negative for back pain and gait problem.  Skin:  Negative for pallor and rash.  Neurological:  Positive for dizziness, syncope, weakness and headaches.   Physical Exam Updated Vital Signs BP (!) 135/93    Pulse 77    Temp 97.7 F (36.5 C) (Oral)    Resp 19    SpO2 96%  Physical Exam Vitals and nursing note reviewed.  Constitutional:      General: She is not in acute distress.    Appearance: Normal appearance. She is well-developed. She is not ill-appearing, toxic-appearing or diaphoretic.  HENT:     Head: Normocephalic and atraumatic.     Right Ear: External ear normal.     Left Ear: External ear normal.     Nose: Nose normal.     Mouth/Throat:     Mouth: Mucous membranes are moist.  Eyes:     General: No scleral icterus.       Right eye: No discharge.        Left eye: No discharge.     Extraocular Movements: Extraocular movements intact.     Pupils: Pupils are equal, round, and reactive to light.  Cardiovascular:     Rate and Rhythm: Normal rate and regular rhythm.     Pulses: Normal pulses.     Heart sounds: Normal heart sounds.  Pulmonary:     Effort: Pulmonary effort is normal. No respiratory distress.     Breath sounds: Normal breath sounds.  Abdominal:     General: Abdomen is flat.     Palpations: Abdomen is soft.     Tenderness: There is no abdominal tenderness.  Musculoskeletal:        General: Normal range of motion.     Cervical back: Normal range of motion.     Right lower leg: No edema.     Left lower leg: No edema.  Skin:    General: Skin is warm and dry.     Capillary Refill: Capillary refill takes less than 2 seconds.  Neurological:     General: No focal deficit present.     Mental Status: She is alert and oriented to person, place, and time.     GCS: GCS eye subscore is 4. GCS verbal subscore is 5. GCS motor subscore is 6.     Cranial Nerves: Cranial nerves 2-12 are intact.     Motor: Motor function is intact. No tremor.      Coordination: Coordination is intact.  Psychiatric:        Mood  and Affect: Mood normal.        Behavior: Behavior normal.    ED Results / Procedures / Treatments   Labs (all labs ordered are listed, but only abnormal results are displayed) Labs Reviewed  CBC WITH DIFFERENTIAL/PLATELET - Abnormal; Notable for the following components:      Result Value   WBC 2.1 (*)    RBC 2.30 (*)    Hemoglobin 7.2 (*)    HCT 21.0 (*)    Neutro Abs 1.3 (*)    Lymphs Abs 0.2 (*)    All other components within normal limits  COMPREHENSIVE METABOLIC PANEL - Abnormal; Notable for the following components:   Potassium 3.3 (*)    Creatinine, Ser 1.50 (*)    Calcium 8.6 (*)    GFR, Estimated 44 (*)    Anion gap 3 (*)    All other components within normal limits  URINALYSIS, ROUTINE W REFLEX MICROSCOPIC - Abnormal; Notable for the following components:   APPearance HAZY (*)    Hgb urine dipstick SMALL (*)    Bacteria, UA RARE (*)    All other components within normal limits  URINE CULTURE  PROTIME-INR  D-DIMER, QUANTITATIVE  I-STAT BETA HCG BLOOD, ED ([facility], [facility], [facility] ONLY)  TYPE AND SCREEN  PREPARE RBC (CROSSMATCH)  TROPONIN I (HIGH SENSITIVITY)  TROPONIN I (HIGH SENSITIVITY)    EKG EKG Interpretation  Date/Time:  Tuesday March 16 2021 01:13:03 EST Ventricular Rate:  112 PR Interval:  166 QRS Duration: 88 QT Interval:  383 QTC Calculation: 523 R Axis:   8 Text Interpretation: Sinus tachycardia Low voltage, precordial leads Borderline T abnormalities, diffuse leads Prolonged QT interval Confirmed by Wynona Dove ([id number]) on 03/16/2021 2:31:17 AM  Radiology DG Chest Portable 1 View  Result Date: 03/16/2021 CLINICAL DATA:  Weakness and syncopal episode. EXAM: PORTABLE CHEST 1 VIEW COMPARISON:  Portable chest 02/24/2021 FINDINGS: There is overlying monitor wires. The heart is slightly enlarged. Central vessels are normal caliber. There is a low inspiration with bronchovascular crowding in the  bases. The visualized lungs are clear of infiltrates with limited view of the bases. No pleural effusion is seen. Thoracic cage is intact. IMPRESSION: Low inspiratory effort with limited view of the bases. The visualized lungs are generally clear. Slight cardiomegaly. Similar findings on the prior study. Electronically Signed   By: Telford Nab M.D.   On: 03/16/2021 03:19    Procedures Ultrasound ED Peripheral IV (Provider)  Date/Time: 03/16/2021 3:13 AM Performed by: Jeanell Sparrow, DO Authorized by: Jeanell Sparrow, DO   Procedure details:    Indications: multiple failed IV attempts and poor IV access     Skin Prep: chlorhexidine gluconate     Location:  Right AC   Angiocath:  20 G   Bedside Ultrasound Guided: Yes     Images: not archived     Patient tolerated procedure without complications: Yes     Dressing applied: Yes   .Critical Care Performed by: Jeanell Sparrow, DO Authorized by: Jeanell Sparrow, DO   Critical care provider statement:    Critical care time (minutes):  30   Critical care time was exclusive of:  Separately billable procedures and treating other patients   Critical care was necessary to treat or prevent imminent or life-threatening deterioration of the following conditions: symptomatic anemia.   Critical care was time spent personally by me on the following activities:  Development of treatment plan with patient or surrogate, discussions with consultants, evaluation  of patient's response to treatment, examination of patient, ordering and review of laboratory studies, ordering and review of radiographic studies, ordering and performing treatments and interventions, pulse oximetry, re-evaluation of patient's condition and review of old charts    Current monitoring reviewed by myself demonstrates sinus tachycardia  Medications Ordered in ED Medications  sodium chloride 0.9 % bolus 1,000 mL (0 mLs Intravenous Stopped 03/16/21 0505)  metoCLOPramide (REGLAN) injection  5 mg (5 mg Intravenous Given 03/16/21 0305)  diphenhydrAMINE (BENADRYL) injection 25 mg (25 mg Intravenous Given 03/16/21 0305)  magnesium sulfate IVPB 2 g 50 mL (0 g Intravenous Stopped 03/16/21 0505)  0.9 %  sodium chloride infusion (10 mL/hr Intravenous New Bag/Given 03/16/21 0610)  potassium chloride SA (KLOR-CON M) CR tablet 40 mEq (40 mEq Oral Given 03/16/21 0604)    ED Course/ Medical Decision Making/ A&P                           Medical Decision Making   CC: Headache, syncope, weakness  This patient complains of above; this involves an extensive number of treatment options and is a complaint that carries with it a high risk of complications and morbidity. Vital signs were reviewed. Serious etiologies considered.  Record review:  Previous records obtained and reviewed   Additional history obtained from spouse  Work up as above, notable for:  Labs & imaging results that were available during my care of the patient were reviewed by me and considered in my medical decision making.   I ordered imaging studies which included chest x-ray and I independently visualized and interpreted imaging which showed mild cardiomegaly, similar to prior.    EKG reviewed by myself, no acute ischemia.  Cardiac monitor reviewed by myself.  Sinus tachycardia.  Management: Patient given migraine cocktail, IV fluids.  Reassessment:  Patient reports symptoms greatly improved following headache cocktail, ivf.  Hemoglobin reduced from prior, concern for symptomatic anemia as etiology of complaints today.  Hx mx blood transfusions in the past. Patient consented for 1 unit PRBCs.  UA with some bacteria, urine culture sent, suspicion for uti is low.  Pt signed out to incoming EDP at this time pending blood transfusion. Would favor discharge home following blood transfusion if pt remains stable, recommend close o/p f/u with heme-onc and pcp.          This chart was dictated using voice recognition  software.  Despite best efforts to proofread,  errors can occur which can change the documentation meaning.  Final Clinical Impression(s) / ED Diagnoses Final diagnoses:  Symptomatic anemia    Rx / DC Orders ED Discharge Orders     None         Jeanell Sparrow, DO 03/16/21 2952

## 2021-03-16 NOTE — ED Notes (Signed)
Pt in bed, breakfast tray given, pt offers no complaints at this time.

## 2021-03-17 LAB — URINE CULTURE

## 2021-03-20 LAB — TYPE AND SCREEN
ABO/RH(D): B POS
Antibody Screen: POSITIVE
DAT, IgG: POSITIVE
Donor AG Type: NEGATIVE
Unit division: 0
Unit division: 0

## 2021-03-20 LAB — BPAM RBC
Blood Product Expiration Date: 202301282359
Blood Product Expiration Date: 202302102359
ISSUE DATE / TIME: 202301100904
Unit Type and Rh: 5100
Unit Type and Rh: 7300

## 2021-03-24 ENCOUNTER — Ambulatory Visit: Payer: Medicare Other | Admitting: Neurology

## 2021-03-26 ENCOUNTER — Inpatient Hospital Stay: Payer: Medicare Other

## 2021-03-26 ENCOUNTER — Inpatient Hospital Stay: Payer: Medicare Other | Attending: Physician Assistant

## 2021-03-26 ENCOUNTER — Other Ambulatory Visit: Payer: Self-pay

## 2021-03-26 VITALS — BP 117/78 | HR 110 | Resp 18

## 2021-03-26 DIAGNOSIS — D631 Anemia in chronic kidney disease: Secondary | ICD-10-CM | POA: Diagnosis present

## 2021-03-26 DIAGNOSIS — D649 Anemia, unspecified: Secondary | ICD-10-CM

## 2021-03-26 DIAGNOSIS — N183 Chronic kidney disease, stage 3 unspecified: Secondary | ICD-10-CM | POA: Diagnosis present

## 2021-03-26 LAB — CBC WITH DIFFERENTIAL (CANCER CENTER ONLY)
Abs Immature Granulocytes: 0.13 10*3/uL — ABNORMAL HIGH (ref 0.00–0.07)
Basophils Absolute: 0 10*3/uL (ref 0.0–0.1)
Basophils Relative: 1 %
Eosinophils Absolute: 0.1 10*3/uL (ref 0.0–0.5)
Eosinophils Relative: 3 %
HCT: 20.8 % — ABNORMAL LOW (ref 36.0–46.0)
Hemoglobin: 7.1 g/dL — ABNORMAL LOW (ref 12.0–15.0)
Immature Granulocytes: 7 %
Lymphocytes Relative: 7 %
Lymphs Abs: 0.1 10*3/uL — ABNORMAL LOW (ref 0.7–4.0)
MCH: 29.2 pg (ref 26.0–34.0)
MCHC: 34.1 g/dL (ref 30.0–36.0)
MCV: 85.6 fL (ref 80.0–100.0)
Monocytes Absolute: 0.6 10*3/uL (ref 0.1–1.0)
Monocytes Relative: 32 %
Neutro Abs: 1 10*3/uL — ABNORMAL LOW (ref 1.7–7.7)
Neutrophils Relative %: 50 %
Platelet Count: 284 10*3/uL (ref 150–400)
RBC: 2.43 MIL/uL — ABNORMAL LOW (ref 3.87–5.11)
RDW: 14 % (ref 11.5–15.5)
WBC Count: 2 10*3/uL — ABNORMAL LOW (ref 4.0–10.5)
nRBC: 0 % (ref 0.0–0.2)

## 2021-03-26 LAB — PREPARE RBC (CROSSMATCH)

## 2021-03-26 LAB — SAMPLE TO BLOOD BANK

## 2021-03-26 MED ORDER — EPOETIN ALFA-EPBX 20000 UNIT/ML IJ SOLN
20000.0000 [IU] | Freq: Once | INTRAMUSCULAR | Status: AC
  Administered 2021-03-26: 20000 [IU] via SUBCUTANEOUS
  Filled 2021-03-26: qty 1

## 2021-03-27 ENCOUNTER — Telehealth: Payer: Self-pay | Admitting: *Deleted

## 2021-03-27 ENCOUNTER — Inpatient Hospital Stay: Payer: Medicare Other

## 2021-03-27 NOTE — Telephone Encounter (Signed)
Pt was NO  SHOW  for scheduled blood transfusion today.  Called pt at home and left message on voice mail requesting a call back to the infusion clinic.

## 2021-03-29 ENCOUNTER — Telehealth: Payer: Self-pay | Admitting: Neurology

## 2021-03-29 ENCOUNTER — Other Ambulatory Visit: Payer: Self-pay | Admitting: *Deleted

## 2021-03-29 DIAGNOSIS — D649 Anemia, unspecified: Secondary | ICD-10-CM

## 2021-03-29 NOTE — Telephone Encounter (Signed)
Pt want to make sure [facility] has everything they need from [facility] before calling to schedule an appt. Would like a call from the nurse.

## 2021-03-29 NOTE — Telephone Encounter (Signed)
Can you please f/u and make sure pain management referral got sent to [facility]? Thank you

## 2021-03-29 NOTE — Telephone Encounter (Signed)
The referral was sent to [facility] location.

## 2021-03-29 NOTE — Progress Notes (Signed)
Per Dr.Shadad, followed up with pt missed appt on Saturday 03/27/2021 for blood transfusion. Pt has been scheduled for 2 units of blood on 03/31/21. Pt has been called and confirmed appt.

## 2021-03-30 LAB — TYPE AND SCREEN
ABO/RH(D): B POS
Antibody Screen: POSITIVE
DAT, IgG: POSITIVE
Unit division: 0
Unit division: 0

## 2021-03-30 LAB — BPAM RBC
Blood Product Expiration Date: 202302192359
Blood Product Expiration Date: 202302222359
Unit Type and Rh: 7300
Unit Type and Rh: 7300

## 2021-03-31 ENCOUNTER — Telehealth: Payer: Self-pay | Admitting: *Deleted

## 2021-03-31 ENCOUNTER — Inpatient Hospital Stay: Payer: Medicare Other

## 2021-03-31 ENCOUNTER — Other Ambulatory Visit: Payer: Self-pay

## 2021-03-31 ENCOUNTER — Other Ambulatory Visit: Payer: Self-pay | Admitting: *Deleted

## 2021-03-31 DIAGNOSIS — D649 Anemia, unspecified: Secondary | ICD-10-CM

## 2021-03-31 DIAGNOSIS — N183 Chronic kidney disease, stage 3 unspecified: Secondary | ICD-10-CM | POA: Diagnosis not present

## 2021-03-31 LAB — CBC WITH DIFFERENTIAL (CANCER CENTER ONLY)
Abs Immature Granulocytes: 0.12 10*3/uL — ABNORMAL HIGH (ref 0.00–0.07)
Basophils Absolute: 0 10*3/uL (ref 0.0–0.1)
Basophils Relative: 1 %
Eosinophils Absolute: 0.1 10*3/uL (ref 0.0–0.5)
Eosinophils Relative: 3 %
HCT: 19.3 % — ABNORMAL LOW (ref 36.0–46.0)
Hemoglobin: 6.7 g/dL — CL (ref 12.0–15.0)
Immature Granulocytes: 6 %
Lymphocytes Relative: 12 %
Lymphs Abs: 0.3 10*3/uL — ABNORMAL LOW (ref 0.7–4.0)
MCH: 29.8 pg (ref 26.0–34.0)
MCHC: 34.7 g/dL (ref 30.0–36.0)
MCV: 85.8 fL (ref 80.0–100.0)
Monocytes Absolute: 0.4 10*3/uL (ref 0.1–1.0)
Monocytes Relative: 20 %
Neutro Abs: 1.2 10*3/uL — ABNORMAL LOW (ref 1.7–7.7)
Neutrophils Relative %: 58 %
Platelet Count: 241 10*3/uL (ref 150–400)
RBC: 2.25 MIL/uL — ABNORMAL LOW (ref 3.87–5.11)
RDW: 14.2 % (ref 11.5–15.5)
Smear Review: NORMAL
WBC Count: 2 10*3/uL — ABNORMAL LOW (ref 4.0–10.5)
nRBC: 0 % (ref 0.0–0.2)

## 2021-03-31 LAB — PREPARE RBC (CROSSMATCH)

## 2021-03-31 LAB — SAMPLE TO BLOOD BANK

## 2021-03-31 NOTE — Progress Notes (Signed)
Contacted blood bank to verify orders for blood, blood bank noted blood would not be ready until 4pm at the earliest. Pt requested to go to ED for blood, [place], LPN contacted ED- no available beds. Pt agreed to come back at 0730 04/01/2020 for 2 U of blood. Blood bank confirmed blood will be ready for 04/01/20 appt.

## 2021-03-31 NOTE — Telephone Encounter (Signed)
Dawn Peters from lab called with critical hgb 6.7. Pt will be transfused for 2 units of prbc on 1/26. Called ED charge to make aware of pt status, per [facility], ED charge, there were no beds for pt to receive transfusion. Informed infusion nurse. Pt verbalized understanding that she will be transfused on 1/25

## 2021-04-01 ENCOUNTER — Inpatient Hospital Stay: Payer: Medicare Other

## 2021-04-01 DIAGNOSIS — N183 Chronic kidney disease, stage 3 unspecified: Secondary | ICD-10-CM | POA: Diagnosis not present

## 2021-04-01 DIAGNOSIS — D649 Anemia, unspecified: Secondary | ICD-10-CM

## 2021-04-01 MED ORDER — SODIUM CHLORIDE 0.9% IV SOLUTION
250.0000 mL | Freq: Once | INTRAVENOUS | Status: AC
  Administered 2021-04-01: 250 mL via INTRAVENOUS

## 2021-04-01 MED ORDER — DIPHENHYDRAMINE HCL 25 MG PO CAPS
25.0000 mg | ORAL_CAPSULE | Freq: Once | ORAL | Status: AC
  Administered 2021-04-01: 25 mg via ORAL
  Filled 2021-04-01: qty 1

## 2021-04-01 MED ORDER — ACETAMINOPHEN 325 MG PO TABS
650.0000 mg | ORAL_TABLET | Freq: Once | ORAL | Status: AC
  Administered 2021-04-01: 650 mg via ORAL
  Filled 2021-04-01: qty 2

## 2021-04-01 NOTE — Patient Instructions (Signed)
Blood Transfusion, Adult A blood transfusion is a procedure in which you receive blood or a type of blood cell (blood component) through an IV. You may need a blood transfusion when your blood level is low. This may result from a bleeding disorder, illness, injury, or surgery. The blood may come from a donor. You may also be able to donate blood for yourself (autologous blood donation) before a planned surgery. The blood given in a transfusion is made up of different blood components. You may receive: Red blood cells. These carry oxygen to the cells in the body. Platelets. These help your blood to clot. Plasma. This is the liquid part of your blood. It carries proteins and other substances throughout the body. White blood cells. These help you fight infections. If you have hemophilia or another clotting disorder, you may also receive other types of blood products. Tell a health care provider about: Any blood disorders you have. Any previous reactions you have had during a blood transfusion. Any allergies you have. All medicines you are taking, including vitamins, herbs, eye drops, creams, and over-the-counter medicines. Any surgeries you have had. Any medical conditions you have, including any recent fever or cold symptoms. Whether you are pregnant or may be pregnant. What are the risks? Generally, this is a safe procedure. However, problems may occur. The most common problems include: A mild allergic reaction, such as red, swollen areas of skin (hives) and itching. Fever or chills. This may be the body's response to new blood cells received. This may occur during or up to 4 hours after the transfusion. More serious problems may include: Transfusion-associated circulatory overload (TACO), or too much fluid in the lungs. This may cause breathing problems. A serious allergic reaction, such as difficulty breathing or swelling around the face and lips. Transfusion-related acute lung injury  (TRALI), which causes breathing difficulty and low oxygen in the blood. This can occur within hours of the transfusion or several days later. Iron overload. This can happen after receiving many blood transfusions over a period of time. Infection or virus being transmitted. This is rare because donated blood is carefully tested before it is given. Hemolytic transfusion reaction. This is rare. It happens when your body's defense system (immune system)tries to attack the new blood cells. Symptoms may include fever, chills, nausea, low blood pressure, and low back or chest pain. Transfusion-associated graft-versus-host disease (TAGVHD). This is rare. It happens when donated cells attack your body's healthy tissues. What happens before the procedure? Medicines Ask your health care provider about: Changing or stopping your regular medicines. This is especially important if you are taking diabetes medicines or blood thinners. Taking medicines such as aspirin and ibuprofen. These medicines can thin your blood. Do not take these medicines unless your health care provider tells you to take them. Taking over-the-counter medicines, vitamins, herbs, and supplements. General instructions Follow instructions from your health care provider about eating and drinking restrictions. You will have a blood test to determine your blood type. This is necessary to know what kind of blood your body will accept and to match it to the donor blood. If you are going to have a planned surgery, you may be able to do an autologous blood donation. This may be done in case you need to have a transfusion. You will have your temperature, blood pressure, and pulse monitored before the transfusion. If you have had an allergic reaction to a transfusion in the past, you may be given medicine to help prevent   a reaction. This medicine may be given to you by mouth (orally) or through an IV. Set aside time for the blood transfusion. This  procedure generally takes 1-4 hours to complete. What happens during the procedure?  An IV will be inserted into one of your veins. The bag of donated blood will be attached to your IV. The blood will then enter through your vein. Your temperature, blood pressure, and pulse will be monitored regularly during the transfusion. This monitoring is done to detect early signs of a transfusion reaction. Tell your nurse right away if you have any of these symptoms during the transfusion: Shortness of breath or trouble breathing. Chest or back pain. Fever or chills. Hives or itching. If you have any signs or symptoms of a reaction, your transfusion will be stopped and you may be given medicine. When the transfusion is complete, your IV will be removed. Pressure may be applied to the IV site for a few minutes. A bandage (dressing)will be applied. The procedure may vary among health care providers and hospitals. What happens after the procedure? Your temperature, blood pressure, pulse, breathing rate, and blood oxygen level will be monitored until you leave the hospital or clinic. Your blood may be tested to see how you are responding to the transfusion. You may be warmed with fluids or blankets to maintain a normal body temperature. If you receive your blood transfusion in an outpatient setting, you will be told whom to contact to report any reactions. Where to find more information For more information on blood transfusions, visit the [employer]: redcross.org Summary A blood transfusion is a procedure in which you receive blood or a type of blood cell (blood component) through an IV. The blood you receive may come from a donor or be donated by yourself (autologous blood donation) before a planned surgery. The blood given in a transfusion is made up of different blood components. You may receive red blood cells, platelets, plasma, or white blood cells depending on the condition treated. Your  temperature, blood pressure, and pulse will be monitored before, during, and after the transfusion. After the transfusion, your blood may be tested to see how your body has responded. This information is not intended to replace advice given to you by your health care provider. Make sure you discuss any questions you have with your health care provider. Document Revised: 12/27/2018 Document Reviewed: 08/16/2018 Elsevier Patient Education  2022 [employer] Inc.  

## 2021-04-02 ENCOUNTER — Other Ambulatory Visit: Payer: Self-pay | Admitting: Neurology

## 2021-04-02 DIAGNOSIS — G35 Multiple sclerosis: Secondary | ICD-10-CM

## 2021-04-02 LAB — BPAM RBC
Blood Product Expiration Date: 202302192359
Blood Product Expiration Date: 202302222359
ISSUE DATE / TIME: 202301260833
ISSUE DATE / TIME: 202301260833
Unit Type and Rh: 7300
Unit Type and Rh: 7300

## 2021-04-02 LAB — TYPE AND SCREEN
ABO/RH(D): B POS
Antibody Screen: POSITIVE
DAT, IgG: POSITIVE
Unit division: 0
Unit division: 0

## 2021-04-05 ENCOUNTER — Telehealth: Payer: Self-pay | Admitting: Neurology

## 2021-04-05 ENCOUNTER — Other Ambulatory Visit: Payer: Self-pay | Admitting: Neurology

## 2021-04-05 DIAGNOSIS — G35 Multiple sclerosis: Secondary | ICD-10-CM

## 2021-04-05 MED ORDER — ZEPOSIA 0.92 MG PO CAPS: 1.0000 | ORAL_CAPSULE | Freq: Every day | ORAL | 5 refills | Status: DC

## 2021-04-05 NOTE — Telephone Encounter (Signed)
Pharmacy instructed the pt to call physician because there is not a active prescription for Ozanimod HCl (ZEPOSIA) 0.92 MG CAPS. Would like a call from the nurse to confirm prescription has been resent [facility]  Have been out of medication for 2 weeks.

## 2021-04-05 NOTE — Telephone Encounter (Signed)
I have resent the updated script to [facility] for the pt.  I have called the patient and advised the script had been resent.

## 2021-04-16 ENCOUNTER — Inpatient Hospital Stay: Payer: Medicare Other | Attending: Physician Assistant

## 2021-04-16 ENCOUNTER — Inpatient Hospital Stay: Payer: Medicare Other

## 2021-04-16 ENCOUNTER — Telehealth: Payer: Self-pay | Admitting: *Deleted

## 2021-04-16 ENCOUNTER — Telehealth: Payer: Self-pay | Admitting: Oncology

## 2021-04-16 ENCOUNTER — Other Ambulatory Visit: Payer: Self-pay

## 2021-04-16 DIAGNOSIS — D619 Aplastic anemia, unspecified: Secondary | ICD-10-CM | POA: Diagnosis not present

## 2021-04-16 DIAGNOSIS — D631 Anemia in chronic kidney disease: Secondary | ICD-10-CM | POA: Diagnosis present

## 2021-04-16 DIAGNOSIS — N183 Chronic kidney disease, stage 3 unspecified: Secondary | ICD-10-CM | POA: Insufficient documentation

## 2021-04-16 DIAGNOSIS — D649 Anemia, unspecified: Secondary | ICD-10-CM

## 2021-04-16 LAB — CBC WITH DIFFERENTIAL (CANCER CENTER ONLY)
Abs Immature Granulocytes: 0.06 10*3/uL (ref 0.00–0.07)
Basophils Absolute: 0 10*3/uL (ref 0.0–0.1)
Basophils Relative: 0 %
Eosinophils Absolute: 0.1 10*3/uL (ref 0.0–0.5)
Eosinophils Relative: 3 %
HCT: 21.5 % — ABNORMAL LOW (ref 36.0–46.0)
Hemoglobin: 7.1 g/dL — ABNORMAL LOW (ref 12.0–15.0)
Immature Granulocytes: 2 %
Lymphocytes Relative: 8 %
Lymphs Abs: 0.2 10*3/uL — ABNORMAL LOW (ref 0.7–4.0)
MCH: 29.1 pg (ref 26.0–34.0)
MCHC: 33 g/dL (ref 30.0–36.0)
MCV: 88.1 fL (ref 80.0–100.0)
Monocytes Absolute: 0.5 10*3/uL (ref 0.1–1.0)
Monocytes Relative: 16 %
Neutro Abs: 2.1 10*3/uL (ref 1.7–7.7)
Neutrophils Relative %: 71 %
Platelet Count: 200 10*3/uL (ref 150–400)
RBC: 2.44 MIL/uL — ABNORMAL LOW (ref 3.87–5.11)
RDW: 16 % — ABNORMAL HIGH (ref 11.5–15.5)
WBC Count: 2.9 10*3/uL — ABNORMAL LOW (ref 4.0–10.5)
nRBC: 0 % (ref 0.0–0.2)

## 2021-04-16 LAB — SAMPLE TO BLOOD BANK

## 2021-04-16 NOTE — Telephone Encounter (Signed)
LM for patient that she missed her injection appt today. Needs to be rescheduled for Monday. Message to scheduler

## 2021-04-16 NOTE — Telephone Encounter (Signed)
Sch per 2/10, pt son aware

## 2021-04-16 NOTE — Telephone Encounter (Signed)
Sch per 2/10 inbasket, left msg

## 2021-04-18 ENCOUNTER — Encounter (HOSPITAL_COMMUNITY): Payer: Self-pay | Admitting: *Deleted

## 2021-04-18 ENCOUNTER — Observation Stay (HOSPITAL_COMMUNITY)
Admission: EM | Admit: 2021-04-18 | Discharge: 2021-04-20 | Disposition: A | Discharge status: Home / Self Care | Payer: Medicare Other | Attending: Internal Medicine | Admitting: Internal Medicine

## 2021-04-18 ENCOUNTER — Other Ambulatory Visit: Payer: Self-pay

## 2021-04-18 DIAGNOSIS — D649 Anemia, unspecified: Principal | ICD-10-CM | POA: Insufficient documentation

## 2021-04-18 DIAGNOSIS — E876 Hypokalemia: Secondary | ICD-10-CM | POA: Insufficient documentation

## 2021-04-18 DIAGNOSIS — R531 Weakness: Secondary | ICD-10-CM | POA: Diagnosis not present

## 2021-04-18 DIAGNOSIS — I10 Essential (primary) hypertension: Secondary | ICD-10-CM

## 2021-04-18 DIAGNOSIS — Z86711 Personal history of pulmonary embolism: Secondary | ICD-10-CM | POA: Diagnosis not present

## 2021-04-18 DIAGNOSIS — R519 Headache, unspecified: Secondary | ICD-10-CM

## 2021-04-18 DIAGNOSIS — Z7984 Long term (current) use of oral hypoglycemic drugs: Secondary | ICD-10-CM | POA: Insufficient documentation

## 2021-04-18 DIAGNOSIS — I129 Hypertensive chronic kidney disease with stage 1 through stage 4 chronic kidney disease, or unspecified chronic kidney disease: Secondary | ICD-10-CM | POA: Diagnosis not present

## 2021-04-18 DIAGNOSIS — N1831 Chronic kidney disease, stage 3a: Secondary | ICD-10-CM | POA: Insufficient documentation

## 2021-04-18 DIAGNOSIS — R42 Dizziness and giddiness: Secondary | ICD-10-CM | POA: Diagnosis present

## 2021-04-18 DIAGNOSIS — Z20822 Contact with and (suspected) exposure to covid-19: Secondary | ICD-10-CM | POA: Diagnosis not present

## 2021-04-18 DIAGNOSIS — Z79899 Other long term (current) drug therapy: Secondary | ICD-10-CM | POA: Diagnosis not present

## 2021-04-18 DIAGNOSIS — Z86718 Personal history of other venous thrombosis and embolism: Secondary | ICD-10-CM | POA: Insufficient documentation

## 2021-04-18 DIAGNOSIS — Z96643 Presence of artificial hip joint, bilateral: Secondary | ICD-10-CM | POA: Diagnosis not present

## 2021-04-18 DIAGNOSIS — Z7901 Long term (current) use of anticoagulants: Secondary | ICD-10-CM | POA: Insufficient documentation

## 2021-04-18 DIAGNOSIS — K219 Gastro-esophageal reflux disease without esophagitis: Secondary | ICD-10-CM

## 2021-04-18 LAB — CBC
HCT: 21.5 % — ABNORMAL LOW (ref 36.0–46.0)
Hemoglobin: 7 g/dL — ABNORMAL LOW (ref 12.0–15.0)
MCH: 29.8 pg (ref 26.0–34.0)
MCHC: 32.6 g/dL (ref 30.0–36.0)
MCV: 91.5 fL (ref 80.0–100.0)
Platelets: 227 10*3/uL (ref 150–400)
RBC: 2.35 MIL/uL — ABNORMAL LOW (ref 3.87–5.11)
RDW: 16.9 % — ABNORMAL HIGH (ref 11.5–15.5)
WBC: 4.7 10*3/uL (ref 4.0–10.5)
nRBC: 0.6 % — ABNORMAL HIGH (ref 0.0–0.2)

## 2021-04-18 LAB — URINALYSIS, ROUTINE W REFLEX MICROSCOPIC
Bacteria, UA: NONE SEEN
Bilirubin Urine: NEGATIVE
Glucose, UA: >=500 mg/dL — AB
Hgb urine dipstick: NEGATIVE
Ketones, ur: NEGATIVE mg/dL
Leukocytes,Ua: NEGATIVE
Nitrite: NEGATIVE
Protein, ur: NEGATIVE mg/dL
Specific Gravity, Urine: 1.02 (ref 1.005–1.030)
pH: 5 (ref 5.0–8.0)

## 2021-04-18 LAB — HEPATIC FUNCTION PANEL
ALT: 22 U/L (ref 0–44)
AST: 20 U/L (ref 15–41)
Albumin: 3.7 g/dL (ref 3.5–5.0)
Alkaline Phosphatase: 86 U/L (ref 38–126)
Bilirubin, Direct: 0.1 mg/dL (ref 0.0–0.2)
Indirect Bilirubin: 0.1 mg/dL — ABNORMAL LOW (ref 0.3–0.9)
Total Bilirubin: 0.2 mg/dL — ABNORMAL LOW (ref 0.3–1.2)
Total Protein: 6.5 g/dL (ref 6.5–8.1)

## 2021-04-18 LAB — BASIC METABOLIC PANEL
Anion gap: 6 (ref 5–15)
BUN: 17 mg/dL (ref 6–20)
CO2: 23 mmol/L (ref 22–32)
Calcium: 8.5 mg/dL — ABNORMAL LOW (ref 8.9–10.3)
Chloride: 108 mmol/L (ref 98–111)
Creatinine, Ser: 1.43 mg/dL — ABNORMAL HIGH (ref 0.44–1.00)
GFR, Estimated: 47 mL/min — ABNORMAL LOW (ref >60)
Glucose, Bld: 106 mg/dL — ABNORMAL HIGH (ref 70–99)
Potassium: 3.6 mmol/L (ref 3.5–5.1)
Sodium: 137 mmol/L (ref 135–145)

## 2021-04-18 LAB — CBG MONITORING, ED: Glucose-Capillary: 107 mg/dL — ABNORMAL HIGH (ref 70–99)

## 2021-04-18 LAB — I-STAT BETA HCG BLOOD, ED (MC, WL, AP ONLY): I-stat hCG, quantitative: <5 m[IU]/mL (ref <5)

## 2021-04-18 LAB — PREPARE RBC (CROSSMATCH)

## 2021-04-18 MED ORDER — ACETAMINOPHEN 325 MG PO TABS
650.0000 mg | ORAL_TABLET | Freq: Once | ORAL | Status: AC
  Administered 2021-04-18: 650 mg via ORAL
  Filled 2021-04-18: qty 2

## 2021-04-18 MED ORDER — DIPHENHYDRAMINE HCL 50 MG/ML IJ SOLN
50.0000 mg | Freq: Once | INTRAMUSCULAR | Status: AC
  Administered 2021-04-18: 50 mg via INTRAVENOUS
  Filled 2021-04-18: qty 1

## 2021-04-18 MED ORDER — METOCLOPRAMIDE HCL 5 MG/ML IJ SOLN
10.0000 mg | Freq: Once | INTRAMUSCULAR | Status: AC
  Administered 2021-04-18: 10 mg via INTRAVENOUS
  Filled 2021-04-18: qty 2

## 2021-04-18 MED ORDER — ACETAMINOPHEN 650 MG RE SUPP: 650.0000 mg | Freq: Four times a day (QID) | RECTAL | Status: DC | PRN

## 2021-04-18 MED ORDER — SODIUM CHLORIDE 0.9 % IV SOLN
10.0000 mL/h | Freq: Once | INTRAVENOUS | Status: AC
  Administered 2021-04-18: 10 mL/h via INTRAVENOUS

## 2021-04-18 MED ORDER — ACETAMINOPHEN 325 MG PO TABS
650.0000 mg | ORAL_TABLET | Freq: Four times a day (QID) | ORAL | Status: DC | PRN
  Administered 2021-04-19: 650 mg via ORAL
  Filled 2021-04-18: qty 2

## 2021-04-18 MED ORDER — SODIUM CHLORIDE 0.9 % IV BOLUS
1000.0000 mL | Freq: Once | INTRAVENOUS | Status: AC
  Administered 2021-04-18: 1000 mL via INTRAVENOUS

## 2021-04-18 NOTE — ED Notes (Signed)
Confirmed with blood bank the blood was not ready yet for transfusion. Patient sitting up eating dinner. No complaints. No signs of distress.

## 2021-04-18 NOTE — ED Notes (Signed)
Ambulatory to bathroom. Gait steady. No signs of distress.  °

## 2021-04-18 NOTE — ED Provider Notes (Signed)
[place] DEPT Provider Note   CSN: [id number] Arrival date & time: 04/18/21  0500     History  Chief Complaint  Patient presents with   Dizziness    Dawn Peters is a 41 y.o. female with PMHx HTN, MS, DVT/PE on Eliquis, and hx of anemia requiring blood transfusions who presents to the ED today with complaint of gradual onset, constant, lightheadedness/pre syncope x 4 days. Pt also complains of headache and fatigue. She has a hx of migraine headaches however states this headache feels similar to when she is anemic requiring transfusion. She last received 2 units PRBC 2 weeks ago. She states that she had her hemoglobin checked earlier this week at a 7.1 and feels like she needs a transfusion again. She denies blurry vision, double vision, nausea, vomiting, fevers, chills, rash, neck stiffness, chest pain, SOB, or any other associated symptoms.   The history is provided by the patient and medical records.      Home Medications Prior to Admission medications   Medication Sig Start Date End Date Taking? Authorizing Provider  acetaminophen (TYLENOL) 500 MG tablet Take 1,000 mg by mouth every 6 (six) hours as needed for moderate pain or headache.    [provider]  amantadine (SYMMETREL) 100 MG capsule Take 100 mg by mouth 2 (two) times daily. 09/27/20   [provider]  amitriptyline (ELAVIL) 25 MG tablet TAKE 1 TO 2 TABLETS(25 TO 50 MG) BY MOUTH AT BEDTIME Patient taking differently: Take 50 mg by mouth at bedtime. 11/17/20   Sater, Nanine Means, MD  amLODipine (NORVASC) 5 MG tablet Take 1 tablet (5 mg total) by mouth daily. Patient taking differently: Take 5 mg by mouth daily after breakfast. 05/21/17   Arrien, Jimmy Picket, MD  amphetamine-dextroamphetamine (ADDERALL XR) 20 MG 24 hr capsule Take 1 capsule (20 mg total) by mouth daily. 02/04/21   Sater, Nanine Means, MD  apixaban (ELIQUIS) 5 MG TABS tablet Take 1 tablet (5 mg total) by  mouth 2 (two) times daily. Patient not taking: Reported on 03/16/2021 09/21/20   Britt Bottom, MD  atorvastatin (LIPITOR) 10 MG tablet Take 10 mg by mouth daily. 12/24/20   [provider]  baclofen (LIORESAL) 10 MG tablet Take 1 tablet (10 mg total) by mouth 4 (four) times daily. 03/12/21   Sater, Nanine Means, MD  busPIRone (BUSPAR) 15 MG tablet Take 1 tablet (15 mg total) by mouth 2 (two) times daily. Must keep follow up 09/21/20 for ongoing refills Patient taking differently: Take 15 mg by mouth 2 (two) times daily. 08/18/20   Sater, Nanine Means, MD  citalopram (CELEXA) 20 MG tablet Take 1 tablet (20 mg total) by mouth daily. 02/19/19   Sater, Nanine Means, MD  dalfampridine 10 MG TB12 Take 1 tablet (10 mg total) by mouth 2 (two) times daily. 09/21/20   Sater, Nanine Means, MD  gabapentin (NEURONTIN) 800 MG tablet Take 1 tablet (800 mg total) by mouth 4 (four) times daily. 09/21/20   Sater, Nanine Means, MD  JARDIANCE 10 MG TABS tablet Take 10 mg by mouth daily. 03/14/21   [provider]  LINZESS 72 MCG capsule Take 72 mcg by mouth every morning. 02/18/21   [provider]  metoprolol succinate (TOPROL-XL) 50 MG 24 hr tablet Take 50 mg by mouth daily. 09/27/20   [provider]  Multiple Vitamin (MULTIVITAMIN WITH MINERALS) TABS tablet Take 1 tablet by mouth daily. 09/20/17   Kayleen Memos, DO  norethindrone (MICRONOR) 0.35 MG tablet Take 1 tablet (0.35 mg total) by mouth daily. 11/19/18   Clarnce Flock, MD  omeprazole (PRILOSEC) 40 MG capsule Take 40 mg by mouth daily. 09/27/20   [provider]  Ozanimod HCl (ZEPOSIA) 0.92 MG CAPS Take 1 capsule (0.92 mg total) by mouth daily. 04/05/21   Sater, Nanine Means, MD  SUMAtriptan (IMITREX) 100 MG tablet TAKE 1 TABLET BY MOUTH 1 TIME FOR UP TO 1 DOSE AS NEEDED FOR MIGRAINE. MAY REPEAT IN 2 HOURS IF HEADACHE PERSISTS OR RECURS Patient taking differently: Take 100 mg by mouth daily as needed for migraine or headache. 09/21/20    Sater, Nanine Means, MD  tamsulosin (FLOMAX) 0.4 MG CAPS capsule TAKE 1 CAPSULE(0.4 MG) BY MOUTH DAILY Patient taking differently: Take 0.4 mg by mouth daily. 08/05/20   Sater, Nanine Means, MD  tiZANidine (ZANAFLEX) 4 MG tablet TAKE 1 TABLET BY MOUTH EVERY NIGHT AT BEDTIME Patient taking differently: Take 4 mg by mouth at bedtime. 08/05/20   Sater, Nanine Means, MD  Oxcarbazepine (TRILEPTAL) 300 MG tablet Take 1 tablet (300 mg total) by mouth 2 (two) times daily. Patient not taking: Reported on 07/19/2019 11/11/15 07/19/19  Britt Bottom, MD      Allergies    Ace inhibitors, Amoxicillin, and Lisinopril    Review of Systems   Review of Systems  Constitutional:  Positive for fatigue. Negative for chills and fever.  Eyes:  Negative for visual disturbance.  Respiratory:  Negative for shortness of breath.   Cardiovascular:  Negative for chest pain.  Gastrointestinal:  Negative for nausea and vomiting.  Neurological:  Positive for light-headedness and headaches. Negative for dizziness and syncope.  All other systems reviewed and are negative.  Physical Exam Updated Vital Signs BP 111/74    Pulse 96    Temp 98.1 F (36.7 C) (Oral)    Resp 16    Ht 5\' 6"  (1.676 m)    Wt 91.5 kg    SpO2 100%    BMI 32.56 kg/m  Physical Exam Vitals and nursing note reviewed.  Constitutional:      Appearance: She is not ill-appearing or diaphoretic.  HENT:     Head: Normocephalic and atraumatic.  Eyes:     Comments: Conjunctival pallor  Cardiovascular:     Rate and Rhythm: Normal rate and regular rhythm.     Pulses: Normal pulses.  Pulmonary:     Effort: Pulmonary effort is normal.     Breath sounds: Normal breath sounds. No wheezing, rhonchi or rales.  Abdominal:     Palpations: Abdomen is soft.     Tenderness: There is no abdominal tenderness. There is no guarding or rebound.  Musculoskeletal:     Cervical back: Neck supple.  Skin:    General: Skin is warm and dry.  Neurological:     Mental Status: She is  alert.     Comments: Alert and oriented to self, place, time and event.   Speech is fluent, clear without dysarthria or dysphasia.   Strength 5/5 in upper/lower extremities   Sensation intact in upper/lower extremities   Normal gait.  Negative Romberg. No pronator drift.  Normal finger-to-nose and feet tapping.  CN I not tested  CN II grossly intact visual fields bilaterally. Did not visualize posterior eye.  CN III, IV, VI PERRLA and EOMs intact bilaterally  CN V Intact sensation to sharp and light touch to the face  CN VII facial movements symmetric  CN VIII  not tested  CN IX, X no uvula deviation, symmetric rise of soft palate  CN XI 5/5 SCM and trapezius strength bilaterally  CN XII Midline tongue protrusion, symmetric L/R movements      ED Results / Procedures / Treatments   Labs (all labs ordered are listed, but only abnormal results are displayed) Labs Reviewed  BASIC METABOLIC PANEL - Abnormal; Notable for the following components:      Result Value   Glucose, Bld 106 (*)    Creatinine, Ser 1.43 (*)    Calcium 8.5 (*)    GFR, Estimated 47 (*)    All other components within normal limits  CBC - Abnormal; Notable for the following components:   RBC 2.35 (*)    Hemoglobin 7.0 (*)    HCT 21.5 (*)    RDW 16.9 (*)    nRBC 0.6 (*)    All other components within normal limits  URINALYSIS, ROUTINE W REFLEX MICROSCOPIC - Abnormal; Notable for the following components:   Glucose, UA >=500 (*)    All other components within normal limits  HEPATIC FUNCTION PANEL - Abnormal; Notable for the following components:   Total Bilirubin 0.2 (*)    Indirect Bilirubin 0.1 (*)    All other components within normal limits  CBG MONITORING, ED - Abnormal; Notable for the following components:   Glucose-Capillary 107 (*)    All other components within normal limits  I-STAT BETA HCG BLOOD, ED ([facility], [facility], [facility] ONLY)  TYPE AND SCREEN  PREPARE RBC (CROSSMATCH)    EKG EKG  Interpretation  Date/Time:  Sunday April 18 2021 05:15:44 EST Ventricular Rate:  99 PR Interval:  168 QRS Duration: 94 QT Interval:  350 QTC Calculation: 450 R Axis:   12 Text Interpretation: Sinus rhythm Low voltage, precordial leads Borderline T abnormalities, anterior leads No significant change since Confirmed by Isla Pence [phone]) on 04/18/2021 8:21:15 AM  Radiology No results found.  Procedures Procedures    Medications Ordered in ED Medications  metoCLOPramide (REGLAN) injection 10 mg (10 mg Intravenous Given 04/18/21 0819)  diphenhydrAMINE (BENADRYL) injection 50 mg (50 mg Intravenous Given 04/18/21 0820)  sodium chloride 0.9 % bolus 1,000 mL (0 mLs Intravenous Stopped 04/18/21 0930)  0.9 %  sodium chloride infusion (10 mL/hr Intravenous New Bag/Given 04/18/21 1021)    ED Course/ Medical Decision Making/ A&P                           Medical Decision Making 41 year old female who presents to the ED today with complaint of lightheadedness, headache, fatigue for the past 4 days.  History of anemia requiring multiple blood transfusions.  Last transfused 2 weeks ago.  On arrival to the ED today patient is afebrile, nontachycardic and nontachypneic and appears to be no acute distress.  She is resting comfortably in bed.  She is noted to have conjunctival pallor.  She has no focal neurodeficits on exam today and no concern for meningeal signs.  She does have history of migraine headache however states that this feels more similar to previous headaches related to anemia.  Lab work done on 2/10 with hgb 7.1.   Per chart review patient was seen in the ED on 1/10 with similar symptoms and received 1 unit packed red blood cells as well as headache cocktail.  She states her headache greatly improved afterwards and is interested in receiving similar treatment today.  Given she has no focal neurodeficits I  do not feel she requires further work-up for headache.  Will provide headache  cocktail, fluids and await labs including CBC, CMP, EKG, type and screen.   Labwork overall reassuring. On reeval after headache cocktail pt reports improvement in headache. Awaiting blood transfusion at this time. Stable for discharge once she receives transfusion. Appears she had antibodies to same and blood is getting shipped from [place].   Problems Addressed: Symptomatic anemia: acute illness or injury  Amount and/or Complexity of Data Reviewed External Data Reviewed: labs. Labs: ordered.    Details: CBC without leukocytosis. Hgb 7.0 today (7.1 on 2/10). Platelets 227. Given symptomatic at this time will proceed with blood transfusion.  BMP with stable creatinine 1.43. No other electrolyte abnormalities.  LFTs unremarkable.  Beta hcg negative  Risk Prescription drug management.   At shift change case signed out to Charmaine Downs, [place], who will dispo patient after she receives blood transfusion.         Final Clinical Impression(s) / ED Diagnoses Final diagnoses:  Symptomatic anemia  Lightheadedness  Acute nonintractable headache, unspecified headache type    Rx / DC Orders ED Discharge Orders     None         Eustaquio Maize, PA-C 04/18/21 1512    Isla Pence, MD 04/18/21 1539

## 2021-04-18 NOTE — ED Provider Notes (Signed)
Care assumed from [facility], [place] at shift change pending blood transfusion. See her note for full HPI.   In short, patient is a 41 year old female who presents to the ED due to lightheadedness and presyncope x4 days.  She also endorses a headache and fatigue.  History of anemia requiring multiple blood transfusions.  Patient is currently on Eliquis for history of DVT/PE. Patient denies any bleeding. Patient states she feels similar to when she required a blood transfusion in the past. Patient is followed by Dr. Alen Blew with hematology.   Patient's hemoglobin found to be at 7.  Previous provider ordered a blood transfusion.  Plan from previous provider, once transfused, patient may be discharged home with outpatient follow-up. Physical Exam  BP 111/74    Pulse 96    Temp 98.1 F (36.7 C) (Oral)    Resp 16    Ht 5\' 6"  (1.676 m)    Wt 91.5 kg    SpO2 100%    BMI 32.56 kg/m   Physical Exam Vitals and nursing note reviewed.  Constitutional:      General: She is not in acute distress.    Appearance: She is not ill-appearing.  HENT:     Head: Normocephalic.  Eyes:     Pupils: Pupils are equal, round, and reactive to light.  Cardiovascular:     Rate and Rhythm: Normal rate and regular rhythm.     Pulses: Normal pulses.     Heart sounds: Normal heart sounds. No murmur heard.   No friction rub. No gallop.  Pulmonary:     Effort: Pulmonary effort is normal.     Breath sounds: Normal breath sounds.  Abdominal:     General: Abdomen is flat. There is no distension.     Palpations: Abdomen is soft.     Tenderness: There is no abdominal tenderness. There is no guarding or rebound.  Musculoskeletal:        General: Normal range of motion.     Cervical back: Neck supple.  Skin:    General: Skin is warm and dry.  Neurological:     General: No focal deficit present.     Mental Status: She is alert.  Psychiatric:        Mood and Affect: Mood normal.        Behavior: Behavior normal.     Procedures  .Critical Care Performed by: Suzy Bouchard, PA-C Authorized by: Suzy Bouchard, PA-C   Critical care provider statement:    Critical care time (minutes):  30   Critical care was necessary to treat or prevent imminent or life-threatening deterioration of the following conditions:  Dehydration, endocrine crisis and metabolic crisis   Critical care was time spent personally by me on the following activities:  Development of treatment plan with patient or surrogate, discussions with consultants, evaluation of patient's response to treatment, examination of patient, ordering and review of laboratory studies, ordering and review of radiographic studies, ordering and performing treatments and interventions, pulse oximetry, re-evaluation of patient's condition and review of old charts   I assumed direction of critical care for this patient from another provider in my specialty: no     Care discussed with: admitting provider    ED Course / MDM    Medical Decision Making Amount and/or Complexity of Data Reviewed Independent Historian: spouse    Details: Husband at bedside provided history Labs: ordered. Decision-making details documented in ED Course.  Risk OTC drugs. Prescription drug management. Decision regarding  hospitalization.   Care assumed from [facility], [place] at shift change pending blood transfusion. See her note for full MDM.  41 year old female presents to the ED due to lightheadedness, headache, and fatigue x4 days.  History of anemia requiring multiple blood transfusions.  Last transfused 2 weeks ago.  Patient had blood work performed on 2/10 with a hemoglobin of 7.1.  Denies any episodes of bleeding.  Previous provider ordered labs which I personally reviewed.  CBC significant for anemia with hemoglobin at 7 which appears slightly lower than  5:53 PM informed by RN that she just got off the phone with the blood bank. They just finished antibody  testing. Blood now needs to be transported from [place].   7:03 PM reassessed patient at bedside to explain blood transfusion process and time frame given blood needs to come from [place]. Patient states understanding. Patient tolerating po without difficulty. Patient believes she has an appointment tomorrow with her hematologist. Patient is requesting admission; however after a long discussion with patient, we decided to see how she is feeling after her blood transfusion to decide about admission because it appears her hemoglobin is around baseline.   11:31 PM patient finally getting blood transfusion. Patient still endorses severe generalized weakness. Will discuss with hospitalist for admission for symptomatic anemia.   11:43 PM Discussed with Dr. Velia Meyer with [facility] who agrees to admit patient for symptomatic anemia. COVID ordered.         Karie Kirks 04/18/21 2347    Luna Fuse, MD 04/25/21 [phone]

## 2021-04-18 NOTE — ED Notes (Signed)
No signs of distress. No complaints. VSS

## 2021-04-18 NOTE — ED Triage Notes (Signed)
Pt states she has been dizzy, fatigued and headache since yesterday, worse through the night.

## 2021-04-18 NOTE — ED Notes (Signed)
Ambulatory to bathroom. Gait steady. No distress noted. Blood bank stated patient had antibodies and her blood had to come from [place]. Patient continues to await  ready blood for transfusion. Spouse at bedside.

## 2021-04-18 NOTE — Discharge Instructions (Addendum)
Please follow up with both your PCP and hematologist/oncologist Dr. Alen Blew who for further evaluation of your ED visit  Return to the ED for any new/worsening symptoms

## 2021-04-19 ENCOUNTER — Ambulatory Visit: Payer: Medicare Other

## 2021-04-19 ENCOUNTER — Encounter (HOSPITAL_COMMUNITY): Payer: Self-pay | Admitting: Internal Medicine

## 2021-04-19 DIAGNOSIS — D649 Anemia, unspecified: Secondary | ICD-10-CM | POA: Diagnosis not present

## 2021-04-19 DIAGNOSIS — K219 Gastro-esophageal reflux disease without esophagitis: Secondary | ICD-10-CM | POA: Diagnosis present

## 2021-04-19 DIAGNOSIS — R531 Weakness: Secondary | ICD-10-CM

## 2021-04-19 LAB — RESP PANEL BY RT-PCR (FLU A&B, COVID) ARPGX2
Influenza A by PCR: NEGATIVE
Influenza B by PCR: NEGATIVE
SARS Coronavirus 2 by RT PCR: NEGATIVE

## 2021-04-19 LAB — COMPREHENSIVE METABOLIC PANEL
ALT: 18 U/L (ref 0–44)
AST: 18 U/L (ref 15–41)
Albumin: 3.4 g/dL — ABNORMAL LOW (ref 3.5–5.0)
Alkaline Phosphatase: 82 U/L (ref 38–126)
Anion gap: 6 (ref 5–15)
BUN: 13 mg/dL (ref 6–20)
CO2: 22 mmol/L (ref 22–32)
Calcium: 7.9 mg/dL — ABNORMAL LOW (ref 8.9–10.3)
Chloride: 109 mmol/L (ref 98–111)
Creatinine, Ser: 1.14 mg/dL — ABNORMAL HIGH (ref 0.44–1.00)
GFR, Estimated: >60 mL/min (ref >60)
Glucose, Bld: 84 mg/dL (ref 70–99)
Potassium: 3.1 mmol/L — ABNORMAL LOW (ref 3.5–5.1)
Sodium: 137 mmol/L (ref 135–145)
Total Bilirubin: 0.4 mg/dL (ref 0.3–1.2)
Total Protein: 5.8 g/dL — ABNORMAL LOW (ref 6.5–8.1)

## 2021-04-19 LAB — CBC WITH DIFFERENTIAL/PLATELET
Abs Immature Granulocytes: 0.16 10*3/uL — ABNORMAL HIGH (ref 0.00–0.07)
Basophils Absolute: 0 10*3/uL (ref 0.0–0.1)
Basophils Relative: 0 %
Eosinophils Absolute: 0.1 10*3/uL (ref 0.0–0.5)
Eosinophils Relative: 3 %
HCT: 23.2 % — ABNORMAL LOW (ref 36.0–46.0)
Hemoglobin: 7.8 g/dL — ABNORMAL LOW (ref 12.0–15.0)
Immature Granulocytes: 5 %
Lymphocytes Relative: 6 %
Lymphs Abs: 0.2 10*3/uL — ABNORMAL LOW (ref 0.7–4.0)
MCH: 30 pg (ref 26.0–34.0)
MCHC: 33.6 g/dL (ref 30.0–36.0)
MCV: 89.2 fL (ref 80.0–100.0)
Monocytes Absolute: 0.7 10*3/uL (ref 0.1–1.0)
Monocytes Relative: 25 %
Neutro Abs: 1.8 10*3/uL (ref 1.7–7.7)
Neutrophils Relative %: 61 %
Platelets: 196 10*3/uL (ref 150–400)
RBC: 2.6 MIL/uL — ABNORMAL LOW (ref 3.87–5.11)
RDW: 16.1 % — ABNORMAL HIGH (ref 11.5–15.5)
WBC: 3 10*3/uL — ABNORMAL LOW (ref 4.0–10.5)
nRBC: 0 % (ref 0.0–0.2)

## 2021-04-19 LAB — MAGNESIUM: Magnesium: 1.8 mg/dL (ref 1.7–2.4)

## 2021-04-19 LAB — TSH: TSH: 2.14 u[IU]/mL (ref 0.350–4.500)

## 2021-04-19 LAB — PROTIME-INR
INR: 1.1 (ref 0.8–1.2)
Prothrombin Time: 14.5 seconds (ref 11.4–15.2)

## 2021-04-19 LAB — PREPARE RBC (CROSSMATCH)

## 2021-04-19 MED ORDER — BACLOFEN 10 MG PO TABS
10.0000 mg | ORAL_TABLET | Freq: Four times a day (QID) | ORAL | Status: DC
  Administered 2021-04-19 – 2021-04-20 (×3): 10 mg via ORAL
  Filled 2021-04-19 (×3): qty 1

## 2021-04-19 MED ORDER — ATORVASTATIN CALCIUM 10 MG PO TABS
10.0000 mg | ORAL_TABLET | Freq: Every day | ORAL | Status: DC
  Administered 2021-04-19 – 2021-04-20 (×2): 10 mg via ORAL
  Filled 2021-04-19 (×2): qty 1

## 2021-04-19 MED ORDER — DALFAMPRIDINE ER 10 MG PO TB12
10.0000 mg | ORAL_TABLET | Freq: Two times a day (BID) | ORAL | Status: DC
  Administered 2021-04-19: 10 mg via ORAL

## 2021-04-19 MED ORDER — POTASSIUM CHLORIDE CRYS ER 20 MEQ PO TBCR
40.0000 meq | EXTENDED_RELEASE_TABLET | Freq: Once | ORAL | Status: AC
  Administered 2021-04-19: 40 meq via ORAL
  Filled 2021-04-19: qty 2

## 2021-04-19 MED ORDER — NORETHINDRONE 0.35 MG PO TABS
1.0000 | ORAL_TABLET | Freq: Every day | ORAL | Status: DC
  Administered 2021-04-19: 0.35 mg via ORAL

## 2021-04-19 MED ORDER — APIXABAN 5 MG PO TABS
5.0000 mg | ORAL_TABLET | Freq: Two times a day (BID) | ORAL | Status: DC
  Administered 2021-04-19 – 2021-04-20 (×4): 5 mg via ORAL
  Filled 2021-04-19 (×3): qty 1

## 2021-04-19 MED ORDER — AMANTADINE HCL 100 MG PO CAPS
100.0000 mg | ORAL_CAPSULE | Freq: Two times a day (BID) | ORAL | Status: DC
  Administered 2021-04-19 – 2021-04-20 (×4): 100 mg via ORAL
  Filled 2021-04-19 (×4): qty 1

## 2021-04-19 MED ORDER — CITALOPRAM HYDROBROMIDE 20 MG PO TABS
20.0000 mg | ORAL_TABLET | Freq: Every day | ORAL | Status: DC
  Administered 2021-04-19 – 2021-04-20 (×2): 20 mg via ORAL
  Filled 2021-04-19: qty 2
  Filled 2021-04-19: qty 1

## 2021-04-19 MED ORDER — DARBEPOETIN ALFA 25 MCG/0.42ML IJ SOSY
25.0000 ug | PREFILLED_SYRINGE | Freq: Once | INTRAMUSCULAR | Status: AC
  Administered 2021-04-19: 25 ug via SUBCUTANEOUS
  Filled 2021-04-19: qty 0.42

## 2021-04-19 MED ORDER — FOLIC ACID 1 MG PO TABS: 1.0000 mg | ORAL_TABLET | Freq: Every day | ORAL | 1 refills | Status: DC

## 2021-04-19 MED ORDER — GABAPENTIN 400 MG PO CAPS
800.0000 mg | ORAL_CAPSULE | Freq: Four times a day (QID) | ORAL | Status: DC
  Administered 2021-04-19 – 2021-04-20 (×3): 800 mg via ORAL
  Filled 2021-04-19 (×3): qty 2

## 2021-04-19 MED ORDER — DIPHENHYDRAMINE HCL 25 MG PO CAPS
25.0000 mg | ORAL_CAPSULE | Freq: Four times a day (QID) | ORAL | Status: DC | PRN
  Administered 2021-04-19: 25 mg via ORAL
  Filled 2021-04-19: qty 1

## 2021-04-19 MED ORDER — PANTOPRAZOLE SODIUM 40 MG PO TBEC
40.0000 mg | DELAYED_RELEASE_TABLET | Freq: Every day | ORAL | Status: DC
  Administered 2021-04-19 – 2021-04-20 (×2): 40 mg via ORAL
  Filled 2021-04-19 (×2): qty 1

## 2021-04-19 MED ORDER — AMITRIPTYLINE HCL 25 MG PO TABS
50.0000 mg | ORAL_TABLET | Freq: Every day | ORAL | Status: DC
  Administered 2021-04-19 (×2): 50 mg via ORAL
  Filled 2021-04-19 (×2): qty 2

## 2021-04-19 MED ORDER — SODIUM CHLORIDE 0.9% IV SOLUTION: Freq: Once | INTRAVENOUS | Status: DC

## 2021-04-19 MED ORDER — OZANIMOD HCL 0.92 MG PO CAPS
1.0000 | ORAL_CAPSULE | Freq: Every day | ORAL | Status: DC
  Administered 2021-04-19: 0.92 mg via ORAL

## 2021-04-19 MED ORDER — BUSPIRONE HCL 5 MG PO TABS
15.0000 mg | ORAL_TABLET | Freq: Two times a day (BID) | ORAL | Status: DC
  Administered 2021-04-19 – 2021-04-20 (×3): 15 mg via ORAL
  Filled 2021-04-19 (×2): qty 3
  Filled 2021-04-19: qty 2

## 2021-04-19 NOTE — Progress Notes (Signed)
MEDICATION RELATED CONSULT NOTE - INITIAL   Pharmacy Consult for Aranesp Indication: anemia  Allergies  Allergen Reactions   Ace Inhibitors Swelling   Amoxicillin Itching    Did it involve swelling of the face/tongue/throat, SOB, or low BP? Yes Did it involve sudden or severe rash/hives, skin peeling, or any reaction on the inside of your mouth or nose? No Did you need to seek medical attention at a hospital or doctor's office? No When did it last happen?      unknown  If all above answers are NO, may proceed with cephalosporin use.;   Lisinopril Swelling    Lower lip swelling    Patient Measurements: Height: 5\' 6"  (167.6 cm) Weight: 91.5 kg (201 lb 11.5 oz) IBW/kg (Calculated) : 59.3   Vital Signs: Temp: 97.7 F (36.5 C) (02/13 0250) Temp Source: Oral (02/13 0250) BP: 122/75 (02/13 1000) Pulse Rate: 94 (02/13 1000) Intake/Output from previous day: 02/12 0701 - 02/13 0700 In: 1338.2 [Blood:320; IV Piggyback:1018.2] Out: -  Intake/Output from this shift: No intake/output data recorded.  Labs: Recent Labs    04/16/21 1513 04/18/21 0640 04/18/21 0841 04/19/21 0545  WBC 2.9* 4.7  --  3.0*  HGB 7.1* 7.0*  --  7.8*  HCT 21.5* 21.5*  --  23.2*  PLT 200 227  --  196  CREATININE  --  1.43*  --  1.14*  MG  --   --   --  1.8  ALBUMIN  --   --  3.7 3.4*  PROT  --   --  6.5 5.8*  AST  --   --  20 18  ALT  --   --  22 18  ALKPHOS  --   --  86 82  BILITOT  --   --  0.2* 0.4  BILIDIR  --   --  0.1  --   IBILI  --   --  0.1*  --    Estimated Creatinine Clearance: 73.3 mL/min (A) (by C-G formula based on SCr of 1.14 mg/dL (H)).   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  THA   History of DVT of lower extremity 2007   History of encephalopathy 54/4920   acute metabolic encephalopathy secondary to MS,  resolved   History of MRSA infection 2004   right hip infection post THA   History of pulmonary embolus (PE) 2005   Hydronephrosis, left    w/ acute kidney injury 02/ 2019   Hypertension    Left-sided weakness    due to MS   Migraines    MS (multiple sclerosis) [facility]) neurologist-  dr Renne Musca   dx 2000--   Muscle spasticity    Neurogenic bladder    due to MS   Pulmonary embolism, bilateral ([place]) 09/17/2017   treated w/ eliquis   Strains to urinate    Urgency of urination     Assessment: 40 y/oF with acute on chronic anemia with hypoproliferative component,  CKD 3A. Patient receives Retacrit 20000 units q21 days at [facility] with last dose received 03/26/21 (patient missed her appointment on 04/16/21). Pharmacy consulted for Aranesp dosing. Hgb on presentation to ED was 7. Patient received 1 unit PRBCs, and Hgb improved to 7.8. Patient ordered to receive additional 1 unit PRBCs today.    Plan:  Give aranesp 11mcg SQ x 1 today. Would receive 29mcg SQ weekly inpatient based on current outpatient regimen of Retacrit. Outpatient regimen of Retacrit 20000 units SQ q21 days would need to be resumed one week after last inpatient aranesp weekly dose (discussed with Dr. Alen Blew so future appointments can be scheduled appropriately).    Lindell Spar, PharmD, BCPS Clinical Pharmacist  04/19/2021,11:39 AM

## 2021-04-19 NOTE — Progress Notes (Signed)
Patient was admitted to Floor to receive One unit of RBC's. She is to have a CBC drawn post transfusion and then be discharged. She had also had one unit of RBC's in the ER this AM. No admission was done due to the short stay. Director is aware of admission not done.

## 2021-04-19 NOTE — Progress Notes (Signed)
° ° °  OVERNIGHT PROGRESS REPORT  Notified by RN for patient concern over hemoglobin level possibly dropping during the night as she has had this occur before.  Conferred with in-house physician coverage and it was decided to maintain patient overnight and monitor hemoglobin level in the a.m. 04/20/2021.    Gershon Cull MSNA MSN ACNPC-AG Acute Care Nurse Practitioner [place]

## 2021-04-19 NOTE — Progress Notes (Signed)
PT Cancellation Note  Patient Details Name: Dawn Peters MRN: [id number] DOB: [birth year]   Cancelled Treatment:    Reason Eval/Treat Not Completed: Other (comment), Patient reports being up to BR and is moving to a room. PT will check back another time.  [place] Pager [phone] Office [phone]    Claretha Cooper 04/19/2021, 9:05 AM

## 2021-04-19 NOTE — Discharge Summary (Signed)
Physician Discharge Summary  Dawn Peters KZL:[id number] DOB: 02-Sep-1980 DOA: 04/18/2021  PCP: Carylon Perches, NP  Admit date: 04/18/2021 Discharge date: 04/19/2021  Admitted From: Home  Disposition: Home   Recommendations for Outpatient Follow-up:  Follow up with PCP in 1-2 weeks Please obtain BMP/CBC in one week Please follow up on the following pending results: Anemia panel result.  Needs to follow up with Dr Alen Blew in 1 week for Retracrit doses, and further evaluation of anemia.     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  3 to 4 days prior to admission lightheadedness dizziness.  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  She follows with Dr. Alen Blew and she gets retracrits  every 21 days. She is due today/. She will get one dose of epo today prior to discharge.   She was found to have a hemoglobin of 7.0.  She received 1 unit of packed red blood cell.  Posttransfusion hemoglobin 7.8.  She will receive another unit of packed red blood cells and she will be discharged home with close follow-up with Dr. Alen Blew.    Discharge Diagnoses:  Principal Problem:   Symptomatic anemia Active Problems:   Essential hypertension   Stage 3a chronic kidney disease (CKD) (HCC)   Acute on chronic anemia   Generalized weakness   GERD (gastroesophageal reflux disease)  1-Symptomatic acute on chronic anemia: Status post bone marrow biopsy 11/23/2020: Show hypercellular bone marrow for age with trilineage hematopoiesis and  fibrosis. Discussed with Dr Alen Blew, proceed with Epo shot today. He will reschedule appointment in 1 week.  She received one unit PRBC 2/12. Hb increase to 7.8 . Plan to transfuse another unit PRBC today and then discharge home.   2-Generalized weakness: Likely secondary to anemia.  Improved after blood transfusion 3-Recurrent DVT PE: Continue with Eliquis 4-Stage IIIa: At baseline. 5-GERD: Continue with omeprazole. 6-Hypertension: Continue with home medication 7-hypokalemia; replete orally.       Discharge Instructions  Discharge Instructions     Diet - low sodium heart healthy   Complete by: As directed    Increase activity slowly   Complete by: As directed       Allergies as of 04/19/2021       Reactions   Ace Inhibitors Swelling   Amoxicillin Itching   Did it involve swelling of the face/tongue/throat, SOB, or low BP? Yes Did it involve sudden or severe rash/hives, skin peeling, or any reaction on the inside of your mouth or nose? No Did you need to seek medical attention at a hospital or doctor's office? No When did it last happen?      unknown  If all above answers are NO, may proceed with cephalosporin use.;   Lisinopril Swelling   Lower lip swelling        Medication List     TAKE these medications    acetaminophen 500 MG tablet Commonly known as: TYLENOL Take 1,000 mg by mouth every 6 (six) hours as needed for moderate pain or headache.   amantadine 100 MG capsule Commonly known as: SYMMETREL Take 100 mg by mouth 2 (two) times daily.   amitriptyline  25 MG tablet Commonly known as: ELAVIL TAKE 1 TO 2 TABLETS(25 TO 50 MG) BY MOUTH AT BEDTIME What changed: See the new instructions.   amLODipine 5 MG tablet Commonly known as: NORVASC Take 1 tablet (5 mg total) by mouth daily. What changed: when to take this   amphetamine-dextroamphetamine 20 MG 24 hr capsule Commonly known as: Adderall XR Take 1 capsule (20 mg total) by mouth daily.    apixaban 5 MG Tabs tablet Commonly known as: ELIQUIS Take 1 tablet (5 mg total) by mouth 2 (two) times daily.   atorvastatin 10 MG tablet Commonly known as: LIPITOR Take 10 mg by mouth daily.   baclofen 10 MG tablet Commonly known as: LIORESAL Take 1 tablet (10 mg total) by mouth 4 (four) times daily.   busPIRone 15 MG tablet Commonly known as: BUSPAR Take 1 tablet (15 mg total) by mouth 2 (two) times daily. Must keep follow up 09/21/20 for ongoing refills What changed: additional instructions   citalopram 20 MG tablet Commonly known as: CeleXA Take 1 tablet (20 mg total) by mouth daily.   dalfampridine 10 MG Tb12 Take 1 tablet (10 mg total) by mouth 2 (two) times daily.   folic acid 1 MG tablet Commonly known as: FOLVITE Take 1 tablet (1 mg total) by mouth daily.   gabapentin 800 MG tablet Commonly known as: NEURONTIN Take 1 tablet (800 mg total) by mouth 4 (four) times daily.   Jardiance 10 MG Tabs tablet Generic drug: empagliflozin Take 10 mg by mouth daily.   Linzess 72 MCG capsule Generic drug: linaclotide Take 72 mcg by mouth every morning.   metoprolol succinate 50 MG 24 hr tablet Commonly known as: TOPROL-XL Take 50 mg by mouth daily.   multivitamin with minerals Tabs tablet Take 1 tablet by mouth daily.   norethindrone 0.35 MG tablet Commonly known as: MICRONOR Take 1 tablet (0.35 mg total) by mouth daily.   omeprazole 40 MG capsule Commonly known as: PRILOSEC Take 40 mg by mouth daily.   SUMAtriptan 100 MG tablet Commonly known as: IMITREX TAKE 1 TABLET BY MOUTH 1 TIME FOR UP TO 1 DOSE AS NEEDED FOR MIGRAINE. MAY REPEAT IN 2 HOURS IF HEADACHE PERSISTS OR RECURS What changed:  how much to take how to take this when to take this reasons to take this additional instructions   tamsulosin 0.4 MG Caps capsule Commonly known as: FLOMAX TAKE 1 CAPSULE(0.4 MG) BY MOUTH DAILY What changed: See the new instructions.   tiZANidine 4 MG  tablet Commonly known as: ZANAFLEX TAKE 1 TABLET BY MOUTH EVERY NIGHT AT BEDTIME   Zeposia 0.92 MG Caps Generic drug: Ozanimod HCl Take 1 capsule (0.92 mg total) by mouth daily.        Follow-up Information     Schedule an appointment as soon as possible for a visit  with Wyatt Portela, MD.   Specialty: Oncology Contact information: [place] [address] [phone]         Schedule an appointment as soon as possible for a visit  with Carylon Perches, NP.   Specialty: Family Medicine Contact information: [address] [place] [address] [phone]                Allergies  Allergen Reactions   Ace Inhibitors Swelling   Amoxicillin Itching    Did it involve swelling of the face/tongue/throat, SOB, or low BP? Yes Did it involve sudden or severe rash/hives, skin peeling, or any  reaction on the inside of your mouth or nose? No Did you need to seek medical attention at a hospital or doctor's office? No When did it last happen?      unknown  If all above answers are NO, may proceed with cephalosporin use.;   Lisinopril Swelling    Lower lip swelling    Consultations: Dr Alen Blew, phone consultation.    Procedures/Studies: No results found.   Subjective: She is feeling better.  Denies bloody stool, melena.    Discharge Exam: Vitals:   04/19/21 1000 04/19/21 1235  BP: 122/75 123/75  Pulse: 94 (!) 189  Resp: 18 16  Temp:    SpO2: 100% 99%     General: Pt is alert, awake, not in acute distress Cardiovascular: RRR, S1/S2 +, no rubs, no gallops Respiratory: CTA bilaterally, no wheezing, no rhonchi Abdominal: Soft, NT, ND, bowel sounds + Extremities: no edema, no cyanosis    The results of significant diagnostics from this hospitalization (including imaging, microbiology, ancillary and laboratory) are listed below for reference.     Microbiology: Recent Results (from the past 240 hour(s))  Resp Panel by  RT-PCR (Flu A&B, Covid) Nasopharyngeal Swab     Status: None   Collection Time: 04/18/21 11:32 PM   Specimen: Nasopharyngeal Swab; Nasopharyngeal(NP) swabs in vial transport medium  Result Value Ref Range Status   SARS Coronavirus 2 by RT PCR NEGATIVE NEGATIVE Final    Comment: (NOTE) SARS-CoV-2 target nucleic acids are NOT DETECTED.  The SARS-CoV-2 RNA is generally detectable in upper respiratory specimens during the acute phase of infection. The lowest concentration of SARS-CoV-2 viral copies this assay can detect is 138 copies/mL. A negative result does not preclude SARS-Cov-2 infection and should not be used as the sole basis for treatment or other patient management decisions. A negative result may occur with  improper specimen collection/handling, submission of specimen other than nasopharyngeal swab, presence of viral mutation(s) within the areas targeted by this assay, and inadequate number of viral copies(<138 copies/mL). A negative result must be combined with clinical observations, patient history, and epidemiological information. The expected result is Negative.  Fact Sheet for Patients:  EntrepreneurPulse.com.au  Fact Sheet for Healthcare Providers:  IncredibleEmployment.be  This test is no t yet approved or cleared by the [place] FDA and  has been authorized for detection and/or diagnosis of SARS-CoV-2 by FDA under an Emergency Use Authorization (EUA). This EUA will remain  in effect (meaning this test can be used) for the duration of the COVID-19 declaration under Section 564(b)(1) of the Act, 21 U.S.C.section 360bbb-3(b)(1), unless the authorization is terminated  or revoked sooner.       Influenza A by PCR NEGATIVE NEGATIVE Final   Influenza B by PCR NEGATIVE NEGATIVE Final    Comment: (NOTE) The Xpert Xpress SARS-CoV-2/FLU/RSV plus assay is intended as an aid in the diagnosis of influenza from Nasopharyngeal swab  specimens and should not be used as a sole basis for treatment. Nasal washings and aspirates are unacceptable for Xpert Xpress SARS-CoV-2/FLU/RSV testing.  Fact Sheet for Patients: EntrepreneurPulse.com.au  Fact Sheet for Healthcare Providers: IncredibleEmployment.be  This test is not yet approved or cleared by the [place] FDA and has been authorized for detection and/or diagnosis of SARS-CoV-2 by FDA under an Emergency Use Authorization (EUA). This EUA will remain in effect (meaning this test can be used) for the duration of the COVID-19 declaration under Section 564(b)(1) of the Act, 21 U.S.C. section 360bbb-3(b)(1), unless the authorization is terminated  or revoked.  Performed at [facility], [place] [address], [place], [place] [address]      Labs: BNP (last 3 results) No results for input(s): BNP in the last 8760 hours. Basic Metabolic Panel: Recent Labs  Lab 04/18/21 0640 04/19/21 0545  NA 137 137  K 3.6 3.1*  CL 108 109  CO2 23 22  GLUCOSE 106* 84  BUN 17 13  CREATININE 1.43* 1.14*  CALCIUM 8.5* 7.9*  MG  --  1.8   Liver Function Tests: Recent Labs  Lab 04/18/21 0841 04/19/21 0545  AST 20 18  ALT 22 18  ALKPHOS 86 82  BILITOT 0.2* 0.4  PROT 6.5 5.8*  ALBUMIN 3.7 3.4*   No results for input(s): LIPASE, AMYLASE in the last 168 hours. No results for input(s): AMMONIA in the last 168 hours. CBC: Recent Labs  Lab 04/16/21 1513 04/18/21 0640 04/19/21 0545  WBC 2.9* 4.7 3.0*  NEUTROABS 2.1  --  1.8  HGB 7.1* 7.0* 7.8*  HCT 21.5* 21.5* 23.2*  MCV 88.1 91.5 89.2  PLT 200 227 196   Cardiac Enzymes: No results for input(s): CKTOTAL, CKMB, CKMBINDEX, TROPONINI in the last 168 hours. BNP: Invalid input(s): POCBNP CBG: Recent Labs  Lab 04/18/21 0652  GLUCAP 107*   D-Dimer No results for input(s): DDIMER in the last 72 hours. Hgb A1c No results for input(s): HGBA1C in the last 72  hours. Lipid Profile No results for input(s): CHOL, HDL, LDLCALC, TRIG, CHOLHDL, LDLDIRECT in the last 72 hours. Thyroid function studies Recent Labs    04/19/21 0545  TSH 2.140   Anemia work up No results for input(s): VITAMINB12, FOLATE, FERRITIN, TIBC, IRON, RETICCTPCT in the last 72 hours. Urinalysis    Component Value Date/Time   COLORURINE YELLOW 04/18/2021 0955   APPEARANCEUR CLEAR 04/18/2021 0955   APPEARANCEUR Clear 02/19/2019 1710   LABSPEC 1.020 04/18/2021 0955   PHURINE 5.0 04/18/2021 0955   GLUCOSEU >=500 (A) 04/18/2021 0955   HGBUR NEGATIVE 04/18/2021 0955   BILIRUBINUR NEGATIVE 04/18/2021 0955   BILIRUBINUR Negative 02/19/2019 1710   KETONESUR NEGATIVE 04/18/2021 0955   PROTEINUR NEGATIVE 04/18/2021 0955   UROBILINOGEN 0.2 12/15/2016 1136   NITRITE NEGATIVE 04/18/2021 0955   LEUKOCYTESUR NEGATIVE 04/18/2021 0955   Sepsis Labs Invalid input(s): PROCALCITONIN,  WBC,  LACTICIDVEN Microbiology Recent Results (from the past 240 hour(s))  Resp Panel by RT-PCR (Flu A&B, Covid) Nasopharyngeal Swab     Status: None   Collection Time: 04/18/21 11:32 PM   Specimen: Nasopharyngeal Swab; Nasopharyngeal(NP) swabs in vial transport medium  Result Value Ref Range Status   SARS Coronavirus 2 by RT PCR NEGATIVE NEGATIVE Final    Comment: (NOTE) SARS-CoV-2 target nucleic acids are NOT DETECTED.  The SARS-CoV-2 RNA is generally detectable in upper respiratory specimens during the acute phase of infection. The lowest concentration of SARS-CoV-2 viral copies this assay can detect is 138 copies/mL. A negative result does not preclude SARS-Cov-2 infection and should not be used as the sole basis for treatment or other patient management decisions. A negative result may occur with  improper specimen collection/handling, submission of specimen other than nasopharyngeal swab, presence of viral mutation(s) within the areas targeted by this assay, and inadequate number of  viral copies(<138 copies/mL). A negative result must be combined with clinical observations, patient history, and epidemiological information. The expected result is Negative.  Fact Sheet for Patients:  EntrepreneurPulse.com.au  Fact Sheet for Healthcare Providers:  IncredibleEmployment.be  This test is no t  yet approved or cleared by the [place] and  has been authorized for detection and/or diagnosis of SARS-CoV-2 by FDA under an Emergency Use Authorization (EUA). This EUA will remain  in effect (meaning this test can be used) for the duration of the COVID-19 declaration under Section 564(b)(1) of the Act, 21 U.S.C.section 360bbb-3(b)(1), unless the authorization is terminated  or revoked sooner.       Influenza A by PCR NEGATIVE NEGATIVE Final   Influenza B by PCR NEGATIVE NEGATIVE Final    Comment: (NOTE) The Xpert Xpress SARS-CoV-2/FLU/RSV plus assay is intended as an aid in the diagnosis of influenza from Nasopharyngeal swab specimens and should not be used as a sole basis for treatment. Nasal washings and aspirates are unacceptable for Xpert Xpress SARS-CoV-2/FLU/RSV testing.  Fact Sheet for Patients: EntrepreneurPulse.com.au  Fact Sheet for Healthcare Providers: IncredibleEmployment.be  This test is not yet approved or cleared by the [place] FDA and has been authorized for detection and/or diagnosis of SARS-CoV-2 by FDA under an Emergency Use Authorization (EUA). This EUA will remain in effect (meaning this test can be used) for the duration of the COVID-19 declaration under Section 564(b)(1) of the Act, 21 U.S.C. section 360bbb-3(b)(1), unless the authorization is terminated or revoked.  Performed at [facility], [place] [address], [place], [place] [address]      Time coordinating discharge: 40 minutes  SIGNED:   Elmarie Shiley, MD  [facility]

## 2021-04-19 NOTE — ED Notes (Signed)
ED TO INPATIENT HANDOFF REPORT  Name/Age/Gender Dawn Peters 41 y.o. female  Code Status    Code Status Orders  (From admission, onward)           Start     Ordered   04/18/21 2350  Full code  Continuous        04/18/21 2349           Code Status History     Date Active Date Inactive Code Status Order ID Comments User Context   02/24/2021 0348 02/26/2021 1756 Full Code [id number]  Rhetta Mura, DO ED   02/09/2021 0004 02/11/2021 2218 Full Code [id number]  Orene Desanctis, DO ED   11/21/2020 0852 11/27/2020 2038 Full Code [id number]  Harvie Heck, MD ED   04/03/2018 0407 04/05/2018 2156 Full Code [id number]  Rise Patience, MD ED   03/13/2018 2028 03/16/2018 2006 Full Code [id number]  Johnson-Pitts, [place], MD Inpatient   09/18/2017 0806 09/19/2017 2152 Full Code [id number]  Cristal Ford, DO Inpatient   05/19/2017 0837 05/23/2017 2138 Full Code [id number]  Hosie Poisson, MD Inpatient   04/20/2017 0504 04/24/2017 2039 Full Code [id number]  Norval Morton, MD ED   08/13/2016 0351 08/14/2016 1808 Full Code [id number]  Vianne Bulls, MD ED   12/22/2015 0105 12/23/2015 2157 Full Code [id number]  Hugelmeyer, [place], DO Inpatient   07/17/2015 0956 07/19/2015 1340 Full Code [id number]  Kelvin Cellar, MD Inpatient       Home/SNF/Other Home  Chief Complaint Symptomatic anemia [D64.9]  Level of Care/Admitting Diagnosis ED Disposition     ED Disposition  Admit   Condition  --   Comment  Hospital Area: [facility] [[id number]]  Level of Care: Telemetry [5]  Admit to tele based on following criteria: Monitor for Ischemic changes  May place patient in observation at [facility] or [place] if equivalent level of care is available:: No  Covid Evaluation: Asymptomatic Screening Protocol (No Symptoms)  Diagnosis: Symptomatic anemia [2774128]  Admitting Physician: Rhetta Mura [[id number]]  Attending Physician: Rhetta Mura [[id number]]           Medical History Past Medical History:  Diagnosis Date   Anticoagulant long-term use    Eliquis   Anxiety    Chronic pain    CKD (chronic kidney disease), stage III ([place])    Depression    DVT, lower extremity, 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   left lower extremity-- treated with eliquis   Gait disturbance    due to MS   GERD (gastroesophageal reflux disease)    watch diet   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/p  THA   History of DVT of lower extremity 2007   History of encephalopathy 28/3662   acute metabolic encephalopathy secondary to MS,  resolved   History of MRSA infection 2004   right hip infection post THA   History of pulmonary embolus (PE) 2005   Hydronephrosis, left    w/ acute kidney injury 02/ 2019   Hypertension    Left-sided weakness    due to MS   Migraines    MS (multiple sclerosis) [facility]) neurologist-  dr Renne Musca   dx 2000--   Muscle spasticity    Neurogenic bladder    due to MS   Pulmonary embolism, bilateral ([place]) 09/17/2017   treated w/ eliquis   Strains to urinate    Urgency of urination  Allergies Allergies  Allergen Reactions   Ace Inhibitors Swelling   Amoxicillin Itching    Did it involve swelling of the face/tongue/throat, SOB, or low BP? Yes Did it involve sudden or severe rash/hives, skin peeling, or any reaction on the inside of your mouth or nose? No Did you need to seek medical attention at a hospital or doctor's office? No When did it last happen?      unknown  If all above answers are NO, may proceed with cephalosporin use.;   Lisinopril Swelling    Lower lip swelling    IV Location/Drains/Wounds Patient Lines/Drains/Airways Status     Active Line/Drains/Airways     Name Placement date Placement time Site Days   Peripheral IV 04/18/21 22 G 1" Left Hand 04/18/21  1856  Hand  1   Wound / Incision (Open or Dehisced) 11/23/20 Puncture Buttocks Right biopsy site  11/23/20  0939  Buttocks  147            Labs/Imaging Results for orders placed or performed during the hospital encounter of 04/18/21 (from the past 48 hour(s))  Basic metabolic panel     Status: Abnormal   Collection Time: 04/18/21  6:40 AM  Result Value Ref Range   Sodium 137 135 - 145 mmol/L   Potassium 3.6 3.5 - 5.1 mmol/L   Chloride 108 98 - 111 mmol/L   CO2 23 22 - 32 mmol/L   Glucose, Bld 106 (H) 70 - 99 mg/dL    Comment: Glucose reference range applies only to samples taken after fasting for at least 8 hours.   BUN 17 6 - 20 mg/dL   Creatinine, Ser 1.43 (H) 0.44 - 1.00 mg/dL   Calcium 8.5 (L) 8.9 - 10.3 mg/dL   GFR, Estimated 47 (L) >60 mL/min    Comment: (NOTE) Calculated using the CKD-EPI Creatinine Equation (2021)    Anion gap 6 5 - 15    Comment: Performed at [facility], [place] [address], [place], [place] [address]  CBC     Status: Abnormal   Collection Time: 04/18/21  6:40 AM  Result Value Ref Range   WBC 4.7 4.0 - 10.5 K/uL   RBC 2.35 (L) 3.87 - 5.11 MIL/uL   Hemoglobin 7.0 (L) 12.0 - 15.0 g/dL   HCT 21.5 (L) 36.0 - 46.0 %   MCV 91.5 80.0 - 100.0 fL   MCH 29.8 26.0 - 34.0 pg   MCHC 32.6 30.0 - 36.0 g/dL   RDW 16.9 (H) 11.5 - 15.5 %   Platelets 227 150 - 400 K/uL   nRBC 0.6 (H) 0.0 - 0.2 %    Comment: Performed at [facility], [place] [address], [place], [place] [address]  I-Stat beta hCG blood, ED     Status: None   Collection Time: 04/18/21  6:48 AM  Result Value Ref Range   I-stat hCG, quantitative <5.0 <5 mIU/mL   Comment 3            Comment:   GEST. AGE      CONC.  (mIU/mL)   <=1 WEEK        5 - 50     2 WEEKS       50 - 500     3 WEEKS       100 - 10,000     4 WEEKS     1,000 - 30,000        FEMALE AND NON-PREGNANT FEMALE:  LESS THAN 5 mIU/mL   CBG monitoring, ED     Status: Abnormal   Collection Time: 04/18/21  6:52 AM  Result Value Ref Range   Glucose-Capillary 107 (H) 70 - 99 mg/dL    Comment:  Glucose reference range applies only to samples taken after fasting for at least 8 hours.  Hepatic function panel     Status: Abnormal   Collection Time: 04/18/21  8:41 AM  Result Value Ref Range   Total Protein 6.5 6.5 - 8.1 g/dL   Albumin 3.7 3.5 - 5.0 g/dL   AST 20 15 - 41 U/L   ALT 22 0 - 44 U/L   Alkaline Phosphatase 86 38 - 126 U/L   Total Bilirubin 0.2 (L) 0.3 - 1.2 mg/dL   Bilirubin, Direct 0.1 0.0 - 0.2 mg/dL   Indirect Bilirubin 0.1 (L) 0.3 - 0.9 mg/dL    Comment: Performed at [facility], [place] [address], [place], [place] [address]  Type and screen [place]     Status: None   Collection Time: 04/18/21  8:41 AM  Result Value Ref Range   ABO/RH(D) B POS    Antibody Screen POS    Sample Expiration 04/21/2021,2359    Antibody Identification WARM AUTOANTIBODY    DAT, IgG POS    Antibody ID,T [place] WARM AUTOANTIBODY    Unit Number [id number]    Blood Component Type RED CELLS,LR    Unit division 00    Status of Unit ISSUED,FINAL    Transfusion Status OK TO TRANSFUSE    Crossmatch Result COMPATIBLE    Donor AG Type      NEGATIVE FOR E ANTIGEN NEGATIVE FOR CW ANTIGEN NEGATIVE FOR c ANTIGEN Performed at [facility], [place] [address], [place], [place] [address]   Prepare RBC (crossmatch)     Status: None   Collection Time: 04/18/21  8:42 AM  Result Value Ref Range   Order Confirmation      ORDER PROCESSED BY BLOOD BANK Performed at [facility], [place] [address] [place], [place] [address]   Urinalysis, Routine w reflex microscopic Urine, Clean Catch     Status: Abnormal   Collection Time: 04/18/21  9:55 AM  Result Value Ref Range   Color, Urine YELLOW YELLOW   APPearance CLEAR CLEAR   Specific Gravity, Urine 1.020 1.005 - 1.030   pH 5.0 5.0 - 8.0   Glucose, UA >=500 (A) NEGATIVE mg/dL   Hgb urine dipstick NEGATIVE NEGATIVE   Bilirubin Urine NEGATIVE NEGATIVE   Ketones, ur NEGATIVE  NEGATIVE mg/dL   Protein, ur NEGATIVE NEGATIVE mg/dL   Nitrite NEGATIVE NEGATIVE   Leukocytes,Ua NEGATIVE NEGATIVE   WBC, UA 0-5 0 - 5 WBC/hpf   Bacteria, UA NONE SEEN NONE SEEN   Mucus PRESENT     Comment: Performed at [place] [address] [place],  [address]  Resp Panel by RT-PCR (Flu A&B, Covid) Nasopharyngeal Swab     Status: None   Collection Time: 04/18/21 11:32 PM   Specimen: Nasopharyngeal Swab; Nasopharyngeal(NP) swabs in vial transport medium  Result Value Ref Range   SARS Coronavirus 2 by RT PCR NEGATIVE NEGATIVE    Comment: (NOTE) SARS-CoV-2 target nucleic acids are NOT DETECTED.  The SARS-CoV-2 RNA is generally detectable in upper respiratory specimens during the acute phase of infection. The lowest concentration of SARS-CoV-2 viral copies this assay can detect is 138 copies/mL. A negative result does not preclude  SARS-Cov-2 infection and should not be used as the sole basis for treatment or other patient management decisions. A negative result may occur with  improper specimen collection/handling, submission of specimen other than nasopharyngeal swab, presence of viral mutation(s) within the areas targeted by this assay, and inadequate number of viral copies(<138 copies/mL). A negative result must be combined with clinical observations, patient history, and epidemiological information. The expected result is Negative.  Fact Sheet for Patients:  EntrepreneurPulse.com.au  Fact Sheet for Healthcare Providers:  IncredibleEmployment.be  This test is no t yet approved or cleared by the [place] FDA and  has been authorized for detection and/or diagnosis of SARS-CoV-2 by FDA under an Emergency Use Authorization (EUA). This EUA will remain  in effect (meaning this test can be used) for the duration of the COVID-19 declaration under Section 564(b)(1) of the Act, 21 U.S.C.section 360bbb-3(b)(1),  unless the authorization is terminated  or revoked sooner.       Influenza A by PCR NEGATIVE NEGATIVE   Influenza B by PCR NEGATIVE NEGATIVE    Comment: (NOTE) The Xpert Xpress SARS-CoV-2/FLU/RSV plus assay is intended as an aid in the diagnosis of influenza from Nasopharyngeal swab specimens and should not be used as a sole basis for treatment. Nasal washings and aspirates are unacceptable for Xpert Xpress SARS-CoV-2/FLU/RSV testing.  Fact Sheet for Patients: EntrepreneurPulse.com.au  Fact Sheet for Healthcare Providers: IncredibleEmployment.be  This test is not yet approved or cleared by the [place] FDA and has been authorized for detection and/or diagnosis of SARS-CoV-2 by FDA under an Emergency Use Authorization (EUA). This EUA will remain in effect (meaning this test can be used) for the duration of the COVID-19 declaration under Section 564(b)(1) of the Act, 21 U.S.C. section 360bbb-3(b)(1), unless the authorization is terminated or revoked.  Performed at [facility], [place] [address] [place], [place] [address]   Magnesium     Status: None   Collection Time: 04/19/21  5:45 AM  Result Value Ref Range   Magnesium 1.8 1.7 - 2.4 mg/dL    Comment: Performed at [facility], [place] [address], [place], [place] [address]  Comprehensive metabolic panel     Status: Abnormal   Collection Time: 04/19/21  5:45 AM  Result Value Ref Range   Sodium 137 135 - 145 mmol/L   Potassium 3.1 (L) 3.5 - 5.1 mmol/L   Chloride 109 98 - 111 mmol/L   CO2 22 22 - 32 mmol/L   Glucose, Bld 84 70 - 99 mg/dL    Comment: Glucose reference range applies only to samples taken after fasting for at least 8 hours.   BUN 13 6 - 20 mg/dL   Creatinine, Ser 1.14 (H) 0.44 - 1.00 mg/dL   Calcium 7.9 (L) 8.9 - 10.3 mg/dL   Total Protein 5.8 (L) 6.5 - 8.1 g/dL   Albumin 3.4 (L) 3.5 - 5.0 g/dL   AST 18 15 - 41 U/L   ALT 18 0 - 44  U/L   Alkaline Phosphatase 82 38 - 126 U/L   Total Bilirubin 0.4 0.3 - 1.2 mg/dL   GFR, Estimated >60 >60 mL/min    Comment: (NOTE) Calculated using the CKD-EPI Creatinine Equation (2021)    Anion gap 6 5 - 15    Comment: Performed at [facility], [place] [address] [place], [place] [address]  CBC with Differential/Platelet     Status: Abnormal   Collection Time: 04/19/21  5:45 AM  Result Value  Ref Range   WBC 3.0 (L) 4.0 - 10.5 K/uL   RBC 2.60 (L) 3.87 - 5.11 MIL/uL   Hemoglobin 7.8 (L) 12.0 - 15.0 g/dL   HCT 23.2 (L) 36.0 - 46.0 %   MCV 89.2 80.0 - 100.0 fL   MCH 30.0 26.0 - 34.0 pg   MCHC 33.6 30.0 - 36.0 g/dL   RDW 16.1 (H) 11.5 - 15.5 %   Platelets 196 150 - 400 K/uL   nRBC 0.0 0.0 - 0.2 %   Neutrophils Relative % 61 %   Neutro Abs 1.8 1.7 - 7.7 K/uL   Lymphocytes Relative 6 %   Lymphs Abs 0.2 (L) 0.7 - 4.0 K/uL   Monocytes Relative 25 %   Monocytes Absolute 0.7 0.1 - 1.0 K/uL   Eosinophils Relative 3 %   Eosinophils Absolute 0.1 0.0 - 0.5 K/uL   Basophils Relative 0 %   Basophils Absolute 0.0 0.0 - 0.1 K/uL   Immature Granulocytes 5 %   Abs Immature Granulocytes 0.16 (H) 0.00 - 0.07 K/uL    Comment: Performed at [facility], [place] [address], [place], [place] [address]  TSH     Status: None   Collection Time: 04/19/21  5:45 AM  Result Value Ref Range   TSH 2.140 0.350 - 4.500 uIU/mL    Comment: Performed by a 3rd Generation assay with a functional sensitivity of <=0.01 uIU/mL. Performed at [facility], [place] [address], [place], [place] [address]   Protime-INR     Status: None   Collection Time: 04/19/21  5:45 AM  Result Value Ref Range   Prothrombin Time 14.5 11.4 - 15.2 seconds   INR 1.1 0.8 - 1.2    Comment: (NOTE) INR goal varies based on device and disease states. Performed at [facility], [place] [address], [place], [place] [address]    No results found.  Pending Labs Unresulted  Labs (From admission, onward)     Start     Ordered   04/19/21 0841  Prepare RBC (crossmatch)  (Adult Blood Administration - Red Blood Cells)  Once,   R       Question Answer Comment  # of Units 1 unit   Transfusion Indications Symptomatic Anemia   Number of Units to Keep Ahead NO units ahead   If emergent release call blood bank Not emergent release      04/19/21 0840   04/19/21 0756  Vitamin B12  (Anemia Panel (PNL))  Once,   R        04/19/21 0755   04/19/21 0756  Folate  (Anemia Panel (PNL))  Once,   R        04/19/21 0755   04/19/21 0756  Iron and TIBC  (Anemia Panel (PNL))  Once,   R        04/19/21 0755   04/19/21 0756  Ferritin  (Anemia Panel (PNL))  Once,   R        04/19/21 0755   04/19/21 0756  Reticulocytes  (Anemia Panel (PNL))  Once,   R        04/19/21 0755            Vitals/Pain Today's Vitals   04/19/21 0915 04/19/21 1000 04/19/21 1235 04/19/21 1300  BP: 121/76 122/75 123/75 126/89  Pulse: 94 94 (!) 189 94  Resp: 17 18 16 19   Temp:      TempSrc:      SpO2: 100% 100%  99% 96%  Weight:      Height:      PainSc:        Isolation Precautions No active isolations  Medications Medications  acetaminophen (TYLENOL) tablet 650 mg (has no administration in time range)    Or  acetaminophen (TYLENOL) suppository 650 mg (has no administration in time range)  amantadine (SYMMETREL) capsule 100 mg (100 mg Oral Given 04/19/21 0958)  amitriptyline (ELAVIL) tablet 50 mg (50 mg Oral Given 04/19/21 0058)  apixaban (ELIQUIS) tablet 5 mg (5 mg Oral Given 04/19/21 1003)  atorvastatin (LIPITOR) tablet 10 mg (10 mg Oral Given 04/19/21 0959)  baclofen (LIORESAL) tablet 10 mg (10 mg Oral Given 04/19/21 0959)  busPIRone (BUSPAR) tablet 15 mg (15 mg Oral Given 04/19/21 0958)  citalopram (CELEXA) tablet 20 mg (20 mg Oral Given 04/19/21 0959)  dalfampridine TB12 10 mg (10 mg Oral Given 04/19/21 1022)  gabapentin (NEURONTIN) capsule 800 mg (800 mg Oral Given 04/19/21 0959)   norethindrone (MICRONOR) 0.35 MG tablet 0.35 mg (0.35 mg Oral Given 04/19/21 1023)  pantoprazole (PROTONIX) EC tablet 40 mg (40 mg Oral Given 04/19/21 0958)  Ozanimod HCl CAPS 0.92 mg (0.92 mg Oral Given 04/19/21 1023)  diphenhydrAMINE (BENADRYL) capsule 25 mg (25 mg Oral Given 04/19/21 0058)  0.9 %  sodium chloride infusion (Manually program via Guardrails IV Fluids) ( Intravenous Canceled Entry 04/19/21 1021)  metoCLOPramide (REGLAN) injection 10 mg (10 mg Intravenous Given 04/18/21 0819)  diphenhydrAMINE (BENADRYL) injection 50 mg (50 mg Intravenous Given 04/18/21 0820)  sodium chloride 0.9 % bolus 1,000 mL (0 mLs Intravenous Stopped 04/18/21 0930)  0.9 %  sodium chloride infusion (0 mL/hr Intravenous Stopped 04/19/21 1228)  acetaminophen (TYLENOL) tablet 650 mg (650 mg Oral Given 04/18/21 2009)  potassium chloride SA (KLOR-CON M) CR tablet 40 mEq (40 mEq Oral Given 04/19/21 0959)  Darbepoetin Alfa (ARANESP) injection 25 mcg (25 mcg Subcutaneous Given 04/19/21 1252)    Mobility walks with person assist

## 2021-04-19 NOTE — H&P (Signed)
History and Physical    PLEASE NOTE THAT DRAGON DICTATION SOFTWARE WAS USED IN THE CONSTRUCTION OF THIS NOTE.   Dawn Peters LZJ:[id number] DOB: 26-Aug-1980 DOA: 04/18/2021  PCP: Carylon Perches, NP  Patient coming from: home   I have personally briefly reviewed patient's old medical records in [place]  Chief Complaint: Lightheadedness  HPI: Dawn Peters is a 41 y.o. 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   The patient reports 3 to 4 days of lightheadedness, dizziness associated with intermittent presyncope when rising from a seated to a standing position in the absence of any syncope or fall.  She also notes generalized weakness over that timeframe, in the absence of any acute focal weakness, acute focal numbness, paresthesias, facial droop, slurred speech, expressive aphasia, acute change in vision, dysphagia, vertigo.  She confirms a history of chronic anemia, requiring recurrent PRBC transfusions, and notes that the constellation of symptoms that she has been experiencing over the last 3 to 4 days are very similar to that which she has experienced at times of previous acute exacerbations of her chronic anemia, noting that these symptoms have previously improved following PRBC transfusion.  She denies any recent chest pain, shortness of breath.  No recent subjective fever, chills, rigors, or generalized myalgias.  She also denies any recent melena, hematochezia, hematemesis, hemoptysis, or abdominal pain.  No recent trauma.  In the setting of her chronic anemia, she follows with Dr. Alen Blew As her outpatient  hematologist, noting documented associated elements of hypoproliferation as well as folic acid deficiency.  It appears that her baseline hemoglobin range is approximately 8-9.  She was most recently hospitalized for symptomatic acute on chronic anemia on 02/23/2021, with hemoglobin noted to be 6.6 at that time.  Her next scheduled outpatient follow-up appointment with Dr. Alen Blew is scheduled to occur on 04/29/2021.   Aside from being on chronic anticoagulation via Eliquis in the setting of recurrent DVTs/PEs, she denies any additional use of blood thinners as an outpatient, including no aspirin.     ED Course:  Vital signs in the ED were notable for the following: Afebrile; heart rate 92-1 04; blood pressure 112/79- 132/82; respiratory rate 16-20, oxygen saturation 97 to 100% on room air.  Labs were notable for the following: CMP notable for the following: Sodium 137, BUN 17, creatinine 1.43 compared to most recent prior value of 1.5 on 03/16/2021, glucose 106, liver enzymes within normal limits.  CBC notable for white blood cell count 4700, hemoglobin 7.0 associated with normocytic/normochromic findings, platelet count 227.  Urinalysis showed no white blood cells and no bacteria.  COVID-19/influenza PCR are currently pending.  Imaging and additional notable ED work-up: EKG, compared to most recent prior EKG performed on 03/16/2021, shows sinus rhythm with heart rate 99, normal intervals, nonspecific T wave inversion in lead III, similar in appearance to most recent prior EKG, and no evidence of ST changes, including no evidence of ST elevation.  While in the ED, she was typed and screened, and after blood arrive [facility], transfusion of 1 unit PRBC was initiated.  Subsequently, the patient was admitted for overnight observation for further evaluation and management of presenting symptomatic acute on chronic anemia complicated by generalized weakness.  Review of Systems: As per HPI otherwise 10  point review of systems negative.   Past Medical History:  Diagnosis Date   Anticoagulant long-term use    Eliquis   Anxiety    Chronic pain    CKD (chronic kidney disease), stage III ([place])    Depression    DVT, lower extremity, recurrent ([place]) 09/17/2017   left lower extremity-- treated with eliquis   Gait disturbance    due to MS   GERD (gastroesophageal reflux disease)    watch diet   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/p  THA   History of DVT of lower extremity 2007   History of encephalopathy 72/6203   acute metabolic encephalopathy secondary to MS,  resolved   History of MRSA infection 2004   right hip infection post THA   History of pulmonary embolus (PE) 2005   Hydronephrosis, left    w/ acute kidney injury 02/ 2019   Hypertension    Left-sided weakness    due to MS   Migraines    MS (multiple sclerosis) [facility]) neurologist-  dr Renne Musca   dx 2000--   Muscle spasticity    Neurogenic bladder    due to MS   Pulmonary embolism, bilateral ([place]) 09/17/2017   treated w/ eliquis   Strains to urinate    Urgency of urination     Past Surgical History:  Procedure Laterality Date   CYSTOSCOPY WITH RETROGRADE PYELOGRAM, URETEROSCOPY AND STENT PLACEMENT Bilateral 11/29/2017   Procedure: BILATERAL  RETROGRADE PYELOGRAM, LEFT DIAGNOSIC URETEROSCOPY AND STENT LEFT PLACEMENT;  Surgeon: Ardis Hughs, MD;  Location: [facility];  Service: Urology;  Laterality: Bilateral;   HIP ARTHROSCOPY Left 07-05-2013   dr pill _0    iliopsosas release, synovectomy   REVISION TOTAL HIP ARTHROPLASTY Right 03-28-2003    dr Alvan Dame _1    TOTAL HIP ARTHROPLASTY Bilateral left 11-05-2002  dr Alvan Dame _2 ;   right 06/ 2004  _3     Social History:  reports that she has never smoked. She has never used smokeless tobacco. She reports that she does not drink alcohol and does not use drugs.   Allergies  Allergen  Reactions   Ace Inhibitors Swelling   Amoxicillin Itching    Did it involve swelling of the face/tongue/throat, SOB, or low BP? Yes Did it involve sudden or severe rash/hives, skin peeling, or any reaction on the inside of your mouth or nose? No Did you need to seek medical attention at a hospital or doctor's office? No When did it last happen?      unknown  If all above answers are NO, may proceed with cephalosporin use.;   Lisinopril Swelling    Lower lip swelling    Family History  Problem Relation Age of Onset   Healthy Mother    Healthy Father    Breast cancer Other        paternal grandmother dx age 35   Colon cancer Other        paternal grandmother   Hypertension Other    Diabetes Other     Family history reviewed and not pertinent    Prior to Admission medications   Medication Sig Start Date End Date Taking? Authorizing Provider  acetaminophen (TYLENOL) 500 MG tablet Take 1,000 mg by mouth every 6 (six) hours as needed for moderate pain or headache.   Yes [provider]  amantadine (SYMMETREL) 100 MG capsule  Take 100 mg by mouth 2 (two) times daily. 09/27/20  Yes [provider]  amitriptyline (ELAVIL) 25 MG tablet TAKE 1 TO 2 TABLETS(25 TO 50 MG) BY MOUTH AT BEDTIME Patient taking differently: Take 50 mg by mouth at bedtime. 11/17/20  Yes Sater, Nanine Means, MD  amLODipine (NORVASC) 5 MG tablet Take 1 tablet (5 mg total) by mouth daily. Patient taking differently: Take 5 mg by mouth daily after breakfast. 05/21/17  Yes Arrien, Jimmy Picket, MD  amphetamine-dextroamphetamine (ADDERALL XR) 20 MG 24 hr capsule Take 1 capsule (20 mg total) by mouth daily. 02/04/21  Yes Sater, Nanine Means, MD  apixaban (ELIQUIS) 5 MG TABS tablet Take 1 tablet (5 mg total) by mouth 2 (two) times daily. 09/21/20  Yes Sater, Nanine Means, MD  atorvastatin (LIPITOR) 10 MG tablet Take 10 mg by mouth daily. 12/24/20  Yes [provider]  baclofen (LIORESAL) 10 MG tablet Take  1 tablet (10 mg total) by mouth 4 (four) times daily. 03/12/21  Yes Sater, Nanine Means, MD  busPIRone (BUSPAR) 15 MG tablet Take 1 tablet (15 mg total) by mouth 2 (two) times daily. Must keep follow up 09/21/20 for ongoing refills Patient taking differently: Take 15 mg by mouth 2 (two) times daily. 08/18/20  Yes Sater, Nanine Means, MD  citalopram (CELEXA) 20 MG tablet Take 1 tablet (20 mg total) by mouth daily. 02/19/19  Yes Sater, Nanine Means, MD  dalfampridine 10 MG TB12 Take 1 tablet (10 mg total) by mouth 2 (two) times daily. 09/21/20  Yes Sater, Nanine Means, MD  gabapentin (NEURONTIN) 800 MG tablet Take 1 tablet (800 mg total) by mouth 4 (four) times daily. 09/21/20  Yes Sater, Nanine Means, MD  JARDIANCE 10 MG TABS tablet Take 10 mg by mouth daily. 03/14/21  Yes [provider]  LINZESS 72 MCG capsule Take 72 mcg by mouth every morning. 02/18/21  Yes [provider]  metoprolol succinate (TOPROL-XL) 50 MG 24 hr tablet Take 50 mg by mouth daily. 09/27/20  Yes [provider]  Multiple Vitamin (MULTIVITAMIN WITH MINERALS) TABS tablet Take 1 tablet by mouth daily. 09/20/17  Yes Kayleen Memos, DO  norethindrone (MICRONOR) 0.35 MG tablet Take 1 tablet (0.35 mg total) by mouth daily. 11/19/18  Yes Clarnce Flock, MD  omeprazole (PRILOSEC) 40 MG capsule Take 40 mg by mouth daily. 09/27/20  Yes [provider]  Ozanimod HCl (ZEPOSIA) 0.92 MG CAPS Take 1 capsule (0.92 mg total) by mouth daily. 04/05/21  Yes Sater, Nanine Means, MD  SUMAtriptan (IMITREX) 100 MG tablet TAKE 1 TABLET BY MOUTH 1 TIME FOR UP TO 1 DOSE AS NEEDED FOR MIGRAINE. MAY REPEAT IN 2 HOURS IF HEADACHE PERSISTS OR RECURS Patient taking differently: Take 100 mg by mouth daily as needed for migraine or headache. 09/21/20  Yes Sater, Nanine Means, MD  tamsulosin (FLOMAX) 0.4 MG CAPS capsule TAKE 1 CAPSULE(0.4 MG) BY MOUTH DAILY Patient taking differently: Take 0.4 mg by mouth daily. 08/05/20  Yes Sater, Nanine Means, MD  tiZANidine  (ZANAFLEX) 4 MG tablet TAKE 1 TABLET BY MOUTH EVERY NIGHT AT BEDTIME Patient taking differently: Take 4 mg by mouth at bedtime. 08/05/20  Yes Sater, Nanine Means, MD  Oxcarbazepine (TRILEPTAL) 300 MG tablet Take 1 tablet (300 mg total) by mouth 2 (two) times daily. Patient not taking: Reported on 07/19/2019 11/11/15 07/19/19  Britt Bottom, MD     Objective    Physical Exam: Vitals:   04/18/21 1903 04/18/21 2100  04/18/21 2300 04/18/21 2330  BP: 119/74 121/70 119/73 113/77  Pulse: (!) 103 (!) 104 94 99  Resp: _0 Temp:   98.6 F (37 C) 98.3 F (36.8 C)  TempSrc:   Oral Oral  SpO2: 100% 100% 99% 100%  Weight:      Height:        General: appears to be stated age; alert, oriented Skin: warm, dry, no rash Head:  AT/[place] Mouth:  Oral mucosa membranes appear moist, normal dentition Neck: supple; trachea midline Heart:  RRR; did not appreciate any M/R/G Lungs: CTAB, did not appreciate any wheezes, rales, or rhonchi Abdomen: + BS; soft, ND, NT Vascular: 2+ pedal pulses b/l; 2+ radial pulses b/l Extremities: no peripheral edema, no muscle wasting Neuro: strength and sensation intact in upper and lower extremities b/l    Labs on Admission: I have personally reviewed following labs and imaging studies  CBC: Recent Labs  Lab 04/16/21 1513 04/18/21 0640  WBC 2.9* 4.7  NEUTROABS 2.1  --   HGB 7.1* 7.0*  HCT 21.5* 21.5*  MCV 88.1 91.5  PLT 200 575   Basic Metabolic Panel: Recent Labs  Lab 04/18/21 0640  NA 137  K 3.6  CL 108  CO2 23  GLUCOSE 106*  BUN 17  CREATININE 1.43*  CALCIUM 8.5*   GFR: Estimated Creatinine Clearance: 58.4 mL/min (A) (by C-G formula based on SCr of 1.43 mg/dL (H)). Liver Function Tests: Recent Labs  Lab 04/18/21 0841  AST 20  ALT 22  ALKPHOS 86  BILITOT 0.2*  PROT 6.5  ALBUMIN 3.7   No results for input(s): LIPASE, AMYLASE in the last 168 hours. No results for input(s): AMMONIA in the last 168 hours. Coagulation Profile: No  results for input(s): INR, PROTIME in the last 168 hours. Cardiac Enzymes: No results for input(s): CKTOTAL, CKMB, CKMBINDEX, TROPONINI in the last 168 hours. BNP (last 3 results) No results for input(s): PROBNP in the last 8760 hours. HbA1C: No results for input(s): HGBA1C in the last 72 hours. CBG: Recent Labs  Lab 04/18/21 0652  GLUCAP 107*   Lipid Profile: No results for input(s): CHOL, HDL, LDLCALC, TRIG, CHOLHDL, LDLDIRECT in the last 72 hours. Thyroid Function Tests: No results for input(s): TSH, T4TOTAL, FREET4, T3FREE, THYROIDAB in the last 72 hours. Anemia Panel: No results for input(s): VITAMINB12, FOLATE, FERRITIN, TIBC, IRON, RETICCTPCT in the last 72 hours. Urine analysis:    Component Value Date/Time   COLORURINE YELLOW 04/18/2021 [place] 04/18/2021 0955   APPEARANCEUR Clear 02/19/2019 1710   LABSPEC 1.020 04/18/2021 0955   PHURINE 5.0 04/18/2021 0955   GLUCOSEU >=500 (A) 04/18/2021 0955   HGBUR NEGATIVE 04/18/2021 0955   BILIRUBINUR NEGATIVE 04/18/2021 0955   BILIRUBINUR Negative 02/19/2019 1710   KETONESUR NEGATIVE 04/18/2021 0955   PROTEINUR NEGATIVE 04/18/2021 0955   UROBILINOGEN 0.2 12/15/2016 1136   NITRITE NEGATIVE 04/18/2021 0955   LEUKOCYTESUR NEGATIVE 04/18/2021 0955    Radiological Exams on Admission: No results found.   EKG: Independently reviewed, with result as described above.    Assessment/Plan    Principal Problem:   Symptomatic anemia Active Problems:   Essential hypertension   Stage 3a chronic kidney disease (CKD) (HCC)   Acute on chronic anemia   Generalized weakness   GERD (gastroesophageal reflux disease)     #) Symptomatic acute on chronic anemia: In the setting of a documented history of chronic anemia that appears, better review, to be multifactorial, with  documentation of contributions from hypoproliferation as well as folic acid deficiency and associated baseline hemoglobin range of 8-9 requiring  recurrent PRBC transfusions, the patient presents with 3 to 4 days of lightheadedness/dizziness, presyncope and finding of hemoglobin in the absence of this to suggest acute bleed.  Mildly tachycardic, but without evidence of hypotension.  No evidence of chest pain or shortness of breath.  Transfusion of 1 unit PRBC was initiated in the ED this evening. Will complete the transfusion of this 1 unit, with close monitoring for ensuing improvement in her presenting symptoms as well as objective improvement in her hemoglobin back to aforementioned baseline range.  Of note, the patient follows closely with Dr. Alen Blew as her outpatient heme-onc physician, noting that next appointment is scheduled to occur on 04/29/2021.   Plan: Continue transfusion of 1 unit PRBC.  Monitor on telemetry.  Repeat CBC in the morning.  Check INR.  Encouraged scheduled follow-up with outpatient hematologist for further evaluation and management of her chronic anemia.  Fall precautions.       #) Generalized weakness: 3 to 4 days of generalized weakness in the absence of any acute focal neurologic deficits.  Appears distant with the symptomatic nature of her presenting acute on chronic anemia, as above.  Currently transfusing 1 unit PRBC, with close monitoring for improvement in her generalized weakness with this measure.  Some additional contribution at this time, including no evidence of underlying infectious process, including urinalysis that was not consistent with UTI.  Of note, COVID-19/influenza PCR results remain pending at this time.  No acute respiratory symptoms to warrant evaluation via chest x-ray at this time.  Additionally, SIRS criteria met for sepsis at the present time.   Plan: Further evaluation management of presenting symptomatic acute on chronic anemia, including completion of transfusion of 1 unit PRBC, as above.  Fall precautions.  Physical therapy consult order has been placed for the morning.  Check TSH.   Follow-up result of COVID-19/influenza PCR.  Repeat CMP/CBC in the morning.       #) Recurrent DVT/pulmonary emboli: Now chronically anticoagulated on Eliquis with reported good compliance.  No evidence of active bleed.   Plan: Continue chronic anticoagulation via Eliquis.       #) CKD 3A: Documented history of such, with baseline creatinine range of 1.3-1.5, with presenting serum creatinine found to be consistent with this range.  Plan: Monitor strict I's and O's and daily weights.  Repeat BMP in the morning.          #) GERD: documented h/o such; on omeprazole as outpatient.   Plan: continue home PPI.          #) Essential Hypertension: documented h/o such, with outpatient antihypertensive regimen including metoprolol succinate as well as Norvasc.  SBP's in the ED today: In the low 100s to 130s mmHg. Will hold outpatient beta-blocker and Norvasc for now pending completion of transfusion of 1 unit of PRBC with confirmatory improvement in hemoglobin to baseline range.   Plan: Close monitoring of subsequent BP via routine VS. holding home antihypertensive medications for now in the setting of presenting symptomatic acute on chronic anemia, as above.       DVT prophylaxis: SCD's   Code Status: Full code Family Communication: d\w husband, who is present at bedside. Disposition Plan: Per Rounding Team Consults called: none;  Admission status: Observation; med telemetry   PLEASE NOTE THAT DRAGON DICTATION SOFTWARE WAS USED IN THE CONSTRUCTION OF THIS NOTE.   Rhetta Mura  DO [facility] [place]   04/19/2021, 1:05 AM

## 2021-04-20 ENCOUNTER — Other Ambulatory Visit: Payer: Self-pay | Admitting: Neurology

## 2021-04-20 DIAGNOSIS — D649 Anemia, unspecified: Secondary | ICD-10-CM | POA: Diagnosis not present

## 2021-04-20 LAB — TYPE AND SCREEN
ABO/RH(D): B POS
Antibody Screen: POSITIVE
DAT, IgG: POSITIVE
Unit division: 0
Unit division: 0

## 2021-04-20 LAB — CBC
HCT: 30.6 % — ABNORMAL LOW (ref 36.0–46.0)
HCT: 27.1 % — ABNORMAL LOW (ref 36.0–46.0)
Hemoglobin: 10.5 g/dL — ABNORMAL LOW (ref 12.0–15.0)
Hemoglobin: 8.9 g/dL — ABNORMAL LOW (ref 12.0–15.0)
MCH: 29.5 pg (ref 26.0–34.0)
MCH: 29.4 pg (ref 26.0–34.0)
MCHC: 34.3 g/dL (ref 30.0–36.0)
MCHC: 32.8 g/dL (ref 30.0–36.0)
MCV: 86 fL (ref 80.0–100.0)
MCV: 89.4 fL (ref 80.0–100.0)
Platelets: 237 10*3/uL (ref 150–400)
Platelets: 208 10*3/uL (ref 150–400)
RBC: 3.56 MIL/uL — ABNORMAL LOW (ref 3.87–5.11)
RBC: 3.03 MIL/uL — ABNORMAL LOW (ref 3.87–5.11)
RDW: 16.5 % — ABNORMAL HIGH (ref 11.5–15.5)
RDW: 16.7 % — ABNORMAL HIGH (ref 11.5–15.5)
WBC: 3.1 10*3/uL — ABNORMAL LOW (ref 4.0–10.5)
WBC: 2.7 10*3/uL — ABNORMAL LOW (ref 4.0–10.5)
nRBC: 0 % (ref 0.0–0.2)
nRBC: 0 % (ref 0.0–0.2)

## 2021-04-20 LAB — RETICULOCYTES
Immature Retic Fract: 19.2 % — ABNORMAL HIGH (ref 2.3–15.9)
RBC.: 2.97 MIL/uL — ABNORMAL LOW (ref 3.87–5.11)
Retic Count, Absolute: 24.1 10*3/uL (ref 19.0–186.0)
Retic Ct Pct: 0.8 % (ref 0.4–3.1)

## 2021-04-20 LAB — BPAM RBC
Blood Product Expiration Date: 202303132359
Blood Product Expiration Date: 202303222359
ISSUE DATE / TIME: 202302122243
ISSUE DATE / TIME: 202302131658
Unit Type and Rh: 7300
Unit Type and Rh: 7300

## 2021-04-20 LAB — IRON AND TIBC
Iron: 135 ug/dL (ref 28–170)
Saturation Ratios: 98 % — ABNORMAL HIGH (ref 10.4–31.8)
TIBC: 138 ug/dL — ABNORMAL LOW (ref 250–450)
UIBC: 3 ug/dL

## 2021-04-20 LAB — VITAMIN B12: Vitamin B-12: 203 pg/mL (ref 180–914)

## 2021-04-20 LAB — FERRITIN: Ferritin: 1091 ng/mL — ABNORMAL HIGH (ref 11–307)

## 2021-04-20 LAB — FOLATE: Folate: 9.7 ng/mL (ref >5.9)

## 2021-04-20 MED ORDER — CYANOCOBALAMIN 1000 MCG/ML IJ SOLN
1000.0000 ug | Freq: Once | INTRAMUSCULAR | Status: AC
  Administered 2021-04-20: 1000 ug via INTRAMUSCULAR
  Filled 2021-04-20: qty 1

## 2021-04-20 MED ORDER — VITAMIN B-12 1000 MCG PO TABS: 1000.0000 ug | ORAL_TABLET | Freq: Every day | ORAL | 0 refills | Status: DC

## 2021-04-20 NOTE — Discharge Summary (Signed)
Physician Discharge Summary  Dawn Peters HDQ:[id number] DOB: [birth year] DOA: 04/18/2021  PCP: Carylon Perches, NP  Admit date: 04/18/2021 Discharge date: 04/20/2021  Admitted From: Home  Disposition: Home   Recommendations for Outpatient Follow-up:  Follow up with PCP in 1-2 weeks Please obtain BMP/CBC in one week Please follow up on the following pending results: Anemia panel result.  Needs to follow up with Dr Alen Blew in 1 week for Retracrit doses, and further evaluation of anemia.     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  3 to 4 days prior to admission lightheadedness dizziness.  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  She follows with Dr. Alen Blew and she gets retracrits  every 21 days. She is due today/. She will get one dose of epo today prior to discharge.   She was found to have a hemoglobin of 7.0.  She received 1 unit of packed red blood cell.  Posttransfusion hemoglobin 7.8.  She will receive another unit of packed red blood cells and she will be discharged home with close follow-up with Dr. Alen Blew.  She will be discharge on B 12 and folic acid supplement. After second unit of PRBC hb increase to 8.9. patient stable for discharge today.    Discharge Diagnoses:  Principal Problem:   Symptomatic anemia Active Problems:   Essential hypertension   Stage 3a chronic kidney disease (CKD) (HCC)   Acute on chronic anemia   Generalized weakness   GERD (gastroesophageal reflux disease)  1-Symptomatic acute on  chronic anemia: Status post bone marrow biopsy 11/23/2020: Show hypercellular bone marrow for age with trilineage hematopoiesis and fibrosis. Discussed with Dr Alen Blew, proceed with Epo shot today. He will reschedule appointment in 1 week.  She received one unit PRBC 2/12. Hb increase to 7.8 . Plan to transfuse another unit PRBC today and then discharge home.   2-Generalized weakness: Likely secondary to anemia.  Improved after blood transfusion 3-Recurrent DVT PE: Continue with Eliquis 4-Stage IIIa: At baseline. 5-GERD: Continue with omeprazole. 6-Hypertension: Continue with home medication 7-hypokalemia; replete orally.       Discharge Instructions  Discharge Instructions     Diet - low sodium heart healthy   Complete by: As directed    Diet - low sodium heart healthy   Complete by: As directed    Increase activity slowly   Complete by: As directed    Increase activity slowly   Complete by: As directed       Allergies as of 04/20/2021       Reactions   Ace Inhibitors Swelling   Amoxicillin Itching   Did it involve swelling of the face/tongue/throat, SOB, or low BP? Yes Did it involve sudden or severe rash/hives, skin peeling, or any reaction on the inside of your mouth or nose? No Did you need to seek medical attention at a hospital or doctor's office? No When did it last happen?      unknown  If all above answers are NO, may proceed with cephalosporin use.;   Lisinopril Swelling   Lower lip swelling        Medication List  TAKE these medications    acetaminophen 500 MG tablet Commonly known as: TYLENOL Take 1,000 mg by mouth every 6 (six) hours as needed for moderate pain or headache.   amantadine 100 MG capsule Commonly known as: SYMMETREL Take 100 mg by mouth 2 (two) times daily.   amitriptyline 25 MG tablet Commonly known as: ELAVIL TAKE 1 TO 2 TABLETS(25 TO 50 MG) BY MOUTH AT BEDTIME What changed: See the new instructions.   amLODipine 5 MG  tablet Commonly known as: NORVASC Take 1 tablet (5 mg total) by mouth daily. What changed: when to take this   amphetamine-dextroamphetamine 20 MG 24 hr capsule Commonly known as: Adderall XR Take 1 capsule (20 mg total) by mouth daily.   apixaban 5 MG Tabs tablet Commonly known as: ELIQUIS Take 1 tablet (5 mg total) by mouth 2 (two) times daily.   atorvastatin 10 MG tablet Commonly known as: LIPITOR Take 10 mg by mouth daily.   baclofen 10 MG tablet Commonly known as: LIORESAL Take 1 tablet (10 mg total) by mouth 4 (four) times daily.   busPIRone 15 MG tablet Commonly known as: BUSPAR Take 1 tablet (15 mg total) by mouth 2 (two) times daily. Must keep follow up 09/21/20 for ongoing refills What changed: additional instructions   citalopram 20 MG tablet Commonly known as: CeleXA Take 1 tablet (20 mg total) by mouth daily.   dalfampridine 10 MG Tb12 Take 1 tablet (10 mg total) by mouth 2 (two) times daily.   folic acid 1 MG tablet Commonly known as: FOLVITE Take 1 tablet (1 mg total) by mouth daily.   gabapentin 800 MG tablet Commonly known as: NEURONTIN Take 1 tablet (800 mg total) by mouth 4 (four) times daily.   Jardiance 10 MG Tabs tablet Generic drug: empagliflozin Take 10 mg by mouth daily.   Linzess 72 MCG capsule Generic drug: linaclotide Take 72 mcg by mouth every morning.   metoprolol succinate 50 MG 24 hr tablet Commonly known as: TOPROL-XL Take 50 mg by mouth daily.   multivitamin with minerals Tabs tablet Take 1 tablet by mouth daily.   norethindrone 0.35 MG tablet Commonly known as: MICRONOR Take 1 tablet (0.35 mg total) by mouth daily.   omeprazole 40 MG capsule Commonly known as: PRILOSEC Take 40 mg by mouth daily.   SUMAtriptan 100 MG tablet Commonly known as: IMITREX TAKE 1 TABLET BY MOUTH 1 TIME FOR UP TO 1 DOSE AS NEEDED FOR MIGRAINE. MAY REPEAT IN 2 HOURS IF HEADACHE PERSISTS OR RECURS What changed:  how much to take how to take  this when to take this reasons to take this additional instructions   tamsulosin 0.4 MG Caps capsule Commonly known as: FLOMAX TAKE 1 CAPSULE(0.4 MG) BY MOUTH DAILY What changed: See the new instructions.   tiZANidine 4 MG tablet Commonly known as: ZANAFLEX TAKE 1 TABLET BY MOUTH EVERY NIGHT AT BEDTIME   vitamin B-12 1000 MCG tablet Commonly known as: CYANOCOBALAMIN Take 1 tablet (1,000 mcg total) by mouth daily.   Zeposia 0.92 MG Caps Generic drug: Ozanimod HCl Take 1 capsule (0.92 mg total) by mouth daily.        Follow-up Information     Schedule an appointment as soon as possible for a visit  with Wyatt Portela, MD.   Specialty: Oncology Contact information: [place] [address] [phone]         Schedule an appointment as soon as possible for a visit  with Carylon Perches, NP.   Specialty: Family Medicine Contact information: [address] [place] [address] [phone]                Allergies  Allergen Reactions   Ace Inhibitors Swelling   Amoxicillin Itching    Did it involve swelling of the face/tongue/throat, SOB, or low BP? Yes Did it involve sudden or severe rash/hives, skin peeling, or any reaction on the inside of your mouth or nose? No Did you need to seek medical attention at a hospital or doctor's office? No When did it last happen?      unknown  If all above answers are NO, may proceed with cephalosporin use.;   Lisinopril Swelling    Lower lip swelling    Consultations: Dr Alen Blew, phone consultation.    Procedures/Studies: No results found.   Subjective: She is feeling better.  Denies bloody stool, melena.    Discharge Exam: Vitals:   04/19/21 2005 04/20/21 0606  BP: 118/80 123/80  Pulse: 100 89  Resp: 18 18  Temp: 98.8 F (37.1 C) 98.5 F (36.9 C)  SpO2: 99% 99%     General: Pt is alert, awake, not in acute distress Cardiovascular: RRR, S1/S2 +, no rubs, no  gallops Respiratory: CTA bilaterally, no wheezing, no rhonchi Abdominal: Soft, NT, ND, bowel sounds + Extremities: no edema, no cyanosis    The results of significant diagnostics from this hospitalization (including imaging, microbiology, ancillary and laboratory) are listed below for reference.     Microbiology: Recent Results (from the past 240 hour(s))  Resp Panel by RT-PCR (Flu A&B, Covid) Nasopharyngeal Swab     Status: None   Collection Time: 04/18/21 11:32 PM   Specimen: Nasopharyngeal Swab; Nasopharyngeal(NP) swabs in vial transport medium  Result Value Ref Range Status   SARS Coronavirus 2 by RT PCR NEGATIVE NEGATIVE Final    Comment: (NOTE) SARS-CoV-2 target nucleic acids are NOT DETECTED.  The SARS-CoV-2 RNA is generally detectable in upper respiratory specimens during the acute phase of infection. The lowest concentration of SARS-CoV-2 viral copies this assay can detect is 138 copies/mL. A negative result does not preclude SARS-Cov-2 infection and should not be used as the sole basis for treatment or other patient management decisions. A negative result may occur with  improper specimen collection/handling, submission of specimen other than nasopharyngeal swab, presence of viral mutation(s) within the areas targeted by this assay, and inadequate number of viral copies(<138 copies/mL). A negative result must be combined with clinical observations, patient history, and epidemiological information. The expected result is Negative.  Fact Sheet for Patients:  EntrepreneurPulse.com.au  Fact Sheet for Healthcare Providers:  IncredibleEmployment.be  This test is no t yet approved or cleared by the [place] FDA and  has been authorized for detection and/or diagnosis of SARS-CoV-2 by FDA under an Emergency Use Authorization (EUA). This EUA will remain  in effect (meaning this test can be used) for the duration of the COVID-19  declaration under Section 564(b)(1) of the Act, 21 U.S.C.section 360bbb-3(b)(1), unless the authorization is terminated  or revoked sooner.       Influenza A by PCR NEGATIVE NEGATIVE Final   Influenza B by PCR NEGATIVE NEGATIVE Final    Comment: (NOTE) The Xpert Xpress SARS-CoV-2/FLU/RSV plus assay is intended as an aid in the diagnosis of influenza from Nasopharyngeal swab specimens and should not be used as a sole basis for treatment. Nasal washings and aspirates are unacceptable for Xpert Xpress SARS-CoV-2/FLU/RSV testing.  Fact  Sheet for Patients: EntrepreneurPulse.com.au  Fact Sheet for Healthcare Providers: IncredibleEmployment.be  This test is not yet approved or cleared by the [place] FDA and has been authorized for detection and/or diagnosis of SARS-CoV-2 by FDA under an Emergency Use Authorization (EUA). This EUA will remain in effect (meaning this test can be used) for the duration of the COVID-19 declaration under Section 564(b)(1) of the Act, 21 U.S.C. section 360bbb-3(b)(1), unless the authorization is terminated or revoked.  Performed at [facility], [place] [address], [place], [place] [address]      Labs: BNP (last 3 results) No results for input(s): BNP in the last 8760 hours. Basic Metabolic Panel: Recent Labs  Lab 04/18/21 0640 04/19/21 0545  NA 137 137  K 3.6 3.1*  CL 108 109  CO2 23 22  GLUCOSE 106* 84  BUN 17 13  CREATININE 1.43* 1.14*  CALCIUM 8.5* 7.9*  MG  --  1.8    Liver Function Tests: Recent Labs  Lab 04/18/21 0841 04/19/21 0545  AST 20 18  ALT 22 18  ALKPHOS 86 82  BILITOT 0.2* 0.4  PROT 6.5 5.8*  ALBUMIN 3.7 3.4*    No results for input(s): LIPASE, AMYLASE in the last 168 hours. No results for input(s): AMMONIA in the last 168 hours. CBC: Recent Labs  Lab 04/16/21 1513 04/18/21 0640 04/19/21 0545 04/19/21 2045 04/20/21 0622  WBC 2.9* 4.7 3.0* 3.1* 2.7*   NEUTROABS 2.1  --  1.8  --   --   HGB 7.1* 7.0* 7.8* 10.5* 8.9*  HCT 21.5* 21.5* 23.2* 30.6* 27.1*  MCV 88.1 91.5 89.2 86.0 89.4  PLT 200 227 196 237 208    Cardiac Enzymes: No results for input(s): CKTOTAL, CKMB, CKMBINDEX, TROPONINI in the last 168 hours. BNP: Invalid input(s): POCBNP CBG: Recent Labs  Lab 04/18/21 0652  GLUCAP 107*    D-Dimer No results for input(s): DDIMER in the last 72 hours. Hgb A1c No results for input(s): HGBA1C in the last 72 hours. Lipid Profile No results for input(s): CHOL, HDL, LDLCALC, TRIG, CHOLHDL, LDLDIRECT in the last 72 hours. Thyroid function studies Recent Labs    04/19/21 0545  TSH 2.140    Anemia work up Recent Labs    04/20/21 0622  VITAMINB12 203  FOLATE 9.7  FERRITIN 1,091*  TIBC 138*  IRON 135  RETICCTPCT 0.8   Urinalysis    Component Value Date/Time   COLORURINE YELLOW 04/18/2021 [place] 04/18/2021 0955   APPEARANCEUR Clear 02/19/2019 1710   LABSPEC 1.020 04/18/2021 0955   PHURINE 5.0 04/18/2021 0955   GLUCOSEU >=500 (A) 04/18/2021 0955   HGBUR NEGATIVE 04/18/2021 0955   BILIRUBINUR NEGATIVE 04/18/2021 0955   BILIRUBINUR Negative 02/19/2019 1710   KETONESUR NEGATIVE 04/18/2021 0955   PROTEINUR NEGATIVE 04/18/2021 0955   UROBILINOGEN 0.2 12/15/2016 1136   NITRITE NEGATIVE 04/18/2021 0955   LEUKOCYTESUR NEGATIVE 04/18/2021 0955   Sepsis Labs Invalid input(s): PROCALCITONIN,  WBC,  LACTICIDVEN Microbiology Recent Results (from the past 240 hour(s))  Resp Panel by RT-PCR (Flu A&B, Covid) Nasopharyngeal Swab     Status: None   Collection Time: 04/18/21 11:32 PM   Specimen: Nasopharyngeal Swab; Nasopharyngeal(NP) swabs in vial transport medium  Result Value Ref Range Status   SARS Coronavirus 2 by RT PCR NEGATIVE NEGATIVE Final    Comment: (NOTE) SARS-CoV-2 target nucleic acids are NOT DETECTED.  The SARS-CoV-2 RNA is generally detectable in upper respiratory specimens during the acute  phase of  infection. The lowest concentration of SARS-CoV-2 viral copies this assay can detect is 138 copies/mL. A negative result does not preclude SARS-Cov-2 infection and should not be used as the sole basis for treatment or other patient management decisions. A negative result may occur with  improper specimen collection/handling, submission of specimen other than nasopharyngeal swab, presence of viral mutation(s) within the areas targeted by this assay, and inadequate number of viral copies(<138 copies/mL). A negative result must be combined with clinical observations, patient history, and epidemiological information. The expected result is Negative.  Fact Sheet for Patients:  EntrepreneurPulse.com.au  Fact Sheet for Healthcare Providers:  IncredibleEmployment.be  This test is no t yet approved or cleared by the [place] FDA and  has been authorized for detection and/or diagnosis of SARS-CoV-2 by FDA under an Emergency Use Authorization (EUA). This EUA will remain  in effect (meaning this test can be used) for the duration of the COVID-19 declaration under Section 564(b)(1) of the Act, 21 U.S.C.section 360bbb-3(b)(1), unless the authorization is terminated  or revoked sooner.       Influenza A by PCR NEGATIVE NEGATIVE Final   Influenza B by PCR NEGATIVE NEGATIVE Final    Comment: (NOTE) The Xpert Xpress SARS-CoV-2/FLU/RSV plus assay is intended as an aid in the diagnosis of influenza from Nasopharyngeal swab specimens and should not be used as a sole basis for treatment. Nasal washings and aspirates are unacceptable for Xpert Xpress SARS-CoV-2/FLU/RSV testing.  Fact Sheet for Patients: EntrepreneurPulse.com.au  Fact Sheet for Healthcare Providers: IncredibleEmployment.be  This test is not yet approved or cleared by the [place] FDA and has been authorized for detection and/or diagnosis of  SARS-CoV-2 by FDA under an Emergency Use Authorization (EUA). This EUA will remain in effect (meaning this test can be used) for the duration of the COVID-19 declaration under Section 564(b)(1) of the Act, 21 U.S.C. section 360bbb-3(b)(1), unless the authorization is terminated or revoked.  Performed at [facility] [place] [address], [place],  [address]      Time coordinating discharge: 40 minutes  SIGNED:   Elmarie Shiley, MD  [facility]

## 2021-04-20 NOTE — Discharge Planning (Signed)
Patient's discharge medications and instructions went over with patient. Patient educated on discharge instructions and IV taken out per protocol. Patients appts went over with patient. Patient wheeled down via wheelchair for ride

## 2021-04-29 ENCOUNTER — Inpatient Hospital Stay: Payer: Medicare Other

## 2021-04-29 ENCOUNTER — Other Ambulatory Visit: Payer: Self-pay | Admitting: Neurology

## 2021-04-29 ENCOUNTER — Inpatient Hospital Stay: Payer: Medicare Other | Admitting: Oncology

## 2021-04-29 MED ORDER — AMPHETAMINE-DEXTROAMPHET ER 20 MG PO CP24: 20.0000 mg | ORAL_CAPSULE | Freq: Every day | ORAL | 0 refills | Status: DC

## 2021-04-29 NOTE — Telephone Encounter (Signed)
Pt is requesting a refill for amphetamine-dextroamphetamine (ADDERALL XR) 20 MG 24 hr capsule.  Pharmacy: [place] [phone]

## 2021-04-29 NOTE — Telephone Encounter (Signed)
Received refill request for Adderall XR 2omg.  Last OV was on 01/18/21.  Next OV is scheduled for 07/21/21 .  Last RX was written on 02/05/21 for 30 tabs.   [place] [facility] has been reviewed. 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 Dr. Felecia Shelling out of office.

## 2021-05-03 ENCOUNTER — Inpatient Hospital Stay: Payer: Medicare Other

## 2021-05-03 ENCOUNTER — Telehealth: Payer: Self-pay | Admitting: Oncology

## 2021-05-03 ENCOUNTER — Other Ambulatory Visit: Payer: Self-pay

## 2021-05-03 DIAGNOSIS — D649 Anemia, unspecified: Secondary | ICD-10-CM

## 2021-05-03 DIAGNOSIS — D619 Aplastic anemia, unspecified: Secondary | ICD-10-CM | POA: Diagnosis not present

## 2021-05-03 LAB — CBC WITH DIFFERENTIAL (CANCER CENTER ONLY)
Abs Immature Granulocytes: 0.11 10*3/uL — ABNORMAL HIGH (ref 0.00–0.07)
Basophils Absolute: 0 10*3/uL (ref 0.0–0.1)
Basophils Relative: 1 %
Eosinophils Absolute: 0.1 10*3/uL (ref 0.0–0.5)
Eosinophils Relative: 2 %
HCT: 28.6 % — ABNORMAL LOW (ref 36.0–46.0)
Hemoglobin: 9.6 g/dL — ABNORMAL LOW (ref 12.0–15.0)
Immature Granulocytes: 4 %
Lymphocytes Relative: 7 %
Lymphs Abs: 0.2 10*3/uL — ABNORMAL LOW (ref 0.7–4.0)
MCH: 29 pg (ref 26.0–34.0)
MCHC: 33.6 g/dL (ref 30.0–36.0)
MCV: 86.4 fL (ref 80.0–100.0)
Monocytes Absolute: 0.4 10*3/uL (ref 0.1–1.0)
Monocytes Relative: 14 %
Neutro Abs: 1.9 10*3/uL (ref 1.7–7.7)
Neutrophils Relative %: 72 %
Platelet Count: 226 10*3/uL (ref 150–400)
RBC: 3.31 MIL/uL — ABNORMAL LOW (ref 3.87–5.11)
RDW: 16.7 % — ABNORMAL HIGH (ref 11.5–15.5)
WBC Count: 2.6 10*3/uL — ABNORMAL LOW (ref 4.0–10.5)
nRBC: 0 % (ref 0.0–0.2)

## 2021-05-03 LAB — SAMPLE TO BLOOD BANK

## 2021-05-03 NOTE — Telephone Encounter (Signed)
Sch per 2/24 inbasket, unable to reach pt

## 2021-05-07 ENCOUNTER — Other Ambulatory Visit: Payer: Self-pay

## 2021-05-07 ENCOUNTER — Inpatient Hospital Stay: Payer: Medicare HMO | Attending: Physician Assistant

## 2021-05-07 ENCOUNTER — Other Ambulatory Visit: Payer: Self-pay | Admitting: Neurology

## 2021-05-07 VITALS — BP 130/73 | HR 94 | Temp 98.0°F | Resp 18

## 2021-05-07 DIAGNOSIS — Z79899 Other long term (current) drug therapy: Secondary | ICD-10-CM | POA: Insufficient documentation

## 2021-05-07 DIAGNOSIS — G35 Multiple sclerosis: Secondary | ICD-10-CM | POA: Insufficient documentation

## 2021-05-07 DIAGNOSIS — D631 Anemia in chronic kidney disease: Secondary | ICD-10-CM | POA: Insufficient documentation

## 2021-05-07 DIAGNOSIS — Z7901 Long term (current) use of anticoagulants: Secondary | ICD-10-CM | POA: Insufficient documentation

## 2021-05-07 DIAGNOSIS — D6489 Other specified anemias: Secondary | ICD-10-CM | POA: Diagnosis not present

## 2021-05-07 DIAGNOSIS — N183 Chronic kidney disease, stage 3 unspecified: Secondary | ICD-10-CM | POA: Insufficient documentation

## 2021-05-07 DIAGNOSIS — I749 Embolism and thrombosis of unspecified artery: Secondary | ICD-10-CM | POA: Insufficient documentation

## 2021-05-07 DIAGNOSIS — D649 Anemia, unspecified: Secondary | ICD-10-CM

## 2021-05-07 DIAGNOSIS — N1831 Chronic kidney disease, stage 3a: Secondary | ICD-10-CM

## 2021-05-07 MED ORDER — EPOETIN ALFA-EPBX 20000 UNIT/ML IJ SOLN
20000.0000 [IU] | Freq: Once | INTRAMUSCULAR | Status: AC
  Administered 2021-05-07: 20000 [IU] via SUBCUTANEOUS
  Filled 2021-05-07: qty 1

## 2021-05-10 ENCOUNTER — Encounter: Payer: Medicaid Other | Admitting: Physical Medicine and Rehabilitation

## 2021-05-18 ENCOUNTER — Inpatient Hospital Stay: Payer: Medicare HMO

## 2021-05-18 ENCOUNTER — Other Ambulatory Visit: Payer: Self-pay | Admitting: *Deleted

## 2021-05-18 ENCOUNTER — Other Ambulatory Visit: Payer: Self-pay

## 2021-05-18 DIAGNOSIS — D649 Anemia, unspecified: Secondary | ICD-10-CM

## 2021-05-18 DIAGNOSIS — N183 Chronic kidney disease, stage 3 unspecified: Secondary | ICD-10-CM | POA: Diagnosis not present

## 2021-05-18 LAB — CBC WITH DIFFERENTIAL (CANCER CENTER ONLY)
Abs Immature Granulocytes: 0.09 10*3/uL — ABNORMAL HIGH (ref 0.00–0.07)
Basophils Absolute: 0 10*3/uL (ref 0.0–0.1)
Basophils Relative: 0 %
Eosinophils Absolute: 0.1 10*3/uL (ref 0.0–0.5)
Eosinophils Relative: 2 %
HCT: 21.3 % — ABNORMAL LOW (ref 36.0–46.0)
Hemoglobin: 7.2 g/dL — ABNORMAL LOW (ref 12.0–15.0)
Immature Granulocytes: 3 %
Lymphocytes Relative: 5 %
Lymphs Abs: 0.1 10*3/uL — ABNORMAL LOW (ref 0.7–4.0)
MCH: 28.9 pg (ref 26.0–34.0)
MCHC: 33.8 g/dL (ref 30.0–36.0)
MCV: 85.5 fL (ref 80.0–100.0)
Monocytes Absolute: 0.6 10*3/uL (ref 0.1–1.0)
Monocytes Relative: 23 %
Neutro Abs: 1.7 10*3/uL (ref 1.7–7.7)
Neutrophils Relative %: 67 %
Platelet Count: 207 10*3/uL (ref 150–400)
RBC: 2.49 MIL/uL — ABNORMAL LOW (ref 3.87–5.11)
RDW: 16.9 % — ABNORMAL HIGH (ref 11.5–15.5)
WBC Count: 2.6 10*3/uL — ABNORMAL LOW (ref 4.0–10.5)
nRBC: 0 % (ref 0.0–0.2)

## 2021-05-18 LAB — PREPARE RBC (CROSSMATCH)

## 2021-05-18 LAB — SAMPLE TO BLOOD BANK

## 2021-05-19 ENCOUNTER — Encounter: Payer: Self-pay | Admitting: Oncology

## 2021-05-19 ENCOUNTER — Inpatient Hospital Stay: Payer: Medicare HMO

## 2021-05-19 DIAGNOSIS — N183 Chronic kidney disease, stage 3 unspecified: Secondary | ICD-10-CM | POA: Diagnosis not present

## 2021-05-19 DIAGNOSIS — D649 Anemia, unspecified: Secondary | ICD-10-CM

## 2021-05-19 MED ORDER — DIPHENHYDRAMINE HCL 25 MG PO CAPS
25.0000 mg | ORAL_CAPSULE | Freq: Once | ORAL | Status: AC
  Administered 2021-05-19: 25 mg via ORAL
  Filled 2021-05-19: qty 1

## 2021-05-19 MED ORDER — ACETAMINOPHEN 325 MG PO TABS
650.0000 mg | ORAL_TABLET | Freq: Once | ORAL | Status: AC
  Administered 2021-05-19: 650 mg via ORAL
  Filled 2021-05-19: qty 2

## 2021-05-19 NOTE — Patient Instructions (Signed)

## 2021-05-20 LAB — TYPE AND SCREEN
ABO/RH(D): B POS
Antibody Screen: POSITIVE
DAT, IgG: POSITIVE
Unit division: 0
Unit division: 0

## 2021-05-20 LAB — BPAM RBC
Blood Product Expiration Date: 202304222359
Blood Product Expiration Date: 202304232359
ISSUE DATE / TIME: 202303151112
ISSUE DATE / TIME: 202303151311
Unit Type and Rh: 7300
Unit Type and Rh: 7300

## 2021-05-21 ENCOUNTER — Inpatient Hospital Stay: Payer: Medicare HMO

## 2021-05-21 ENCOUNTER — Inpatient Hospital Stay (HOSPITAL_BASED_OUTPATIENT_CLINIC_OR_DEPARTMENT_OTHER): Payer: Medicare HMO | Admitting: Oncology

## 2021-05-21 ENCOUNTER — Other Ambulatory Visit: Payer: Self-pay

## 2021-05-21 VITALS — BP 118/59 | HR 99 | Temp 97.7°F | Resp 16 | Ht 66.0 in | Wt 202.3 lb

## 2021-05-21 DIAGNOSIS — D649 Anemia, unspecified: Secondary | ICD-10-CM | POA: Diagnosis not present

## 2021-05-21 DIAGNOSIS — N183 Chronic kidney disease, stage 3 unspecified: Secondary | ICD-10-CM | POA: Diagnosis not present

## 2021-05-21 NOTE — Progress Notes (Signed)
Hematology and Oncology Follow Up Visit ? ?Dawn Peters ?[id number] ?09/28/1980 41 y.o. ?05/21/2021 3:35 PM ?Dawn Cosier, NP  ? ?Principle Diagnosis: 41 year old with hypoproliferative anemia with multifactorial causes diagnosed in September 2022.   ? ?Prior work-up: ?Bone marrow biopsy obtained on 11/23/2020 showed hypercellular marrow with trilineage hematopoiesis and fibrosis.   ?NGS for JAK2, MPL and CALR showed no detectable genetic alteration on December 16, 2020. ? ? ?Current therapy: ? ?Retacrit 20,000 units started on December 11, 2020.  This is to be given every 3 weeks. ? ?Packed red cell transfusion as needed.   ? ?Interim History: Dawn Peters is here for repeat follow-up.  Since her last visit, she reports no major changes in her health.  She did develop symptoms of dizziness and weakness and required packed red cell transfusion on March 15.  She denies any hematochezia, melena mopped assist.  She denies any recent hospitalizations.  She denies any flareup from multiple sclerosis ? ? ? ? ?Medications: Reviewed without changes. ?Current Outpatient Medications  ?Medication Sig Dispense Refill  ? acetaminophen (TYLENOL) 500 MG tablet Take 1,000 mg by mouth every 6 (six) hours as needed for moderate pain or headache.    ? amantadine (SYMMETREL) 100 MG capsule Take 100 mg by mouth 2 (two) times daily.    ? amitriptyline (ELAVIL) 25 MG tablet TAKE 1 TO 2 TABLETS(25 TO 50 MG) BY MOUTH AT BEDTIME 180 tablet 0  ? amLODipine (NORVASC) 5 MG tablet Take 1 tablet (5 mg total) by mouth daily. (Patient taking differently: Take 5 mg by mouth daily after breakfast.) 90 tablet 0  ? amphetamine-dextroamphetamine (ADDERALL XR) 20 MG 24 hr capsule Take 1 capsule (20 mg total) by mouth daily. 30 capsule 0  ? apixaban (ELIQUIS) 5 MG TABS tablet Take 1 tablet (5 mg total) by mouth 2 (two) times daily. 60 tablet 5  ? atorvastatin (LIPITOR) 10 MG tablet Take 10 mg by mouth daily.    ? baclofen (LIORESAL) 10 MG  tablet Take 1 tablet (10 mg total) by mouth 4 (four) times daily. 360 tablet 1  ? busPIRone (BUSPAR) 15 MG tablet Take 1 tablet (15 mg total) by mouth 2 (two) times daily. 60 tablet 3  ? citalopram (CELEXA) 20 MG tablet Take 1 tablet (20 mg total) by mouth daily. 30 tablet 11  ? dalfampridine 10 MG TB12 Take 1 tablet (10 mg total) by mouth 2 (two) times daily. 60 tablet 11  ? folic acid (FOLVITE) 1 MG tablet Take 1 tablet (1 mg total) by mouth daily. 30 tablet 1  ? gabapentin (NEURONTIN) 800 MG tablet Take 1 tablet (800 mg total) by mouth 4 (four) times daily. 120 tablet 11  ? JARDIANCE 10 MG TABS tablet Take 10 mg by mouth daily.    ? LINZESS 72 MCG capsule Take 72 mcg by mouth every morning.    ? metoprolol succinate (TOPROL-XL) 50 MG 24 hr tablet Take 50 mg by mouth daily.    ? Multiple Vitamin (MULTIVITAMIN WITH MINERALS) TABS tablet Take 1 tablet by mouth daily. 30 tablet 0  ? norethindrone (MICRONOR) 0.35 MG tablet Take 1 tablet (0.35 mg total) by mouth daily. 1 Package 11  ? omeprazole (PRILOSEC) 40 MG capsule Take 40 mg by mouth daily.    ? Ozanimod HCl (ZEPOSIA) 0.92 MG CAPS Take 1 capsule (0.92 mg total) by mouth daily. 30 capsule 5  ? SUMAtriptan (IMITREX) 100 MG tablet TAKE 1 TABLET BY MOUTH 1 TIME FOR  UP TO 1 DOSE AS NEEDED FOR MIGRAINE. MAY REPEAT IN 2 HOURS IF HEADACHE PERSISTS OR RECURS (Patient taking differently: Take 100 mg by mouth daily as needed for migraine or headache.) 10 tablet 5  ? tamsulosin (FLOMAX) 0.4 MG CAPS capsule TAKE 1 CAPSULE(0.4 MG) BY MOUTH DAILY (Patient taking differently: Take 0.4 mg by mouth daily.) 30 capsule 11  ? tiZANidine (ZANAFLEX) 4 MG tablet TAKE 1 TABLET BY MOUTH EVERY NIGHT AT BEDTIME (Patient taking differently: Take 4 mg by mouth at bedtime.) 30 tablet 10  ? vitamin B-12 (CYANOCOBALAMIN) 1000 MCG tablet Take 1 tablet (1,000 mcg total) by mouth daily. 20 tablet 0  ? ?No current facility-administered medications for this visit.  ? ?Facility-Administered  Medications Ordered in Other Visits  ?Medication Dose Route Frequency Provider Last Rate Last Admin  ? acetaminophen (TYLENOL) 325 MG tablet           ? diphenhydrAMINE (BENADRYL) 25 mg capsule           ? ? ? ?Allergies:  ?Allergies  ?Allergen Reactions  ? Ace Inhibitors Swelling  ? Amoxicillin Itching  ?  Did it involve swelling of the face/tongue/throat, SOB, or low BP? Yes ?Did it involve sudden or severe rash/hives, skin peeling, or any reaction on the inside of your mouth or nose? No ?Did you need to seek medical attention at a hospital or doctor's office? No ?When did it last happen?      unknown  ?If all above answers are ?NO?, may proceed with cephalosporin use.;  ? Lisinopril Swelling  ?  Lower lip swelling  ? ? ? ? ?Physical Exam: ?Blood pressure (!) 118/59, pulse 99, temperature 97.7 ?F (36.5 ?C), resp. rate 16, height _0  (1.676 m), weight 202 lb 4.8 oz (91.8 kg), SpO2 100 %. ? ? ?ECOG:  1 ? ? ? ? ?General appearance: Comfortable appearing without any discomfort ?Head: Normocephalic without any trauma ?Oropharynx: Mucous membranes are moist and pink without any thrush or ulcers. ?Eyes: Pupils are equal and round reactive to light. ?Lymph nodes: No cervical, supraclavicular, inguinal or axillary lymphadenopathy.   ?Heart:regular rate and rhythm.  S1 and S2 without leg edema. ?Lung: Clear without any rhonchi or wheezes.  No dullness to percussion. ?Abdomin: Soft, nontender, nondistended with good bowel sounds.  No hepatosplenomegaly. ?Musculoskeletal: No joint deformity or effusion.  Full range of motion noted. ?Neurological: No deficits noted on motor, sensory and deep tendon reflex exam. ?Skin: No petechial rash or dryness.  Appeared moist.  ? ? ? ? ? ?Lab Results: ?Lab Results  ?Component Value Date  ? WBC 2.6 (L) 05/18/2021  ? HGB 7.2 (L) 05/18/2021  ? HCT 21.3 (L) 05/18/2021  ? MCV 85.5 05/18/2021  ? PLT 207 05/18/2021  ? ?  Chemistry   ?   ?Component Value Date/Time  ? NA 137 04/19/2021 0545  ? NA  144 06/21/2017 1626  ? K 3.1 (L) 04/19/2021 0545  ? CL 109 04/19/2021 0545  ? CO2 22 04/19/2021 0545  ? BUN 13 04/19/2021 0545  ? BUN 16 06/21/2017 1626  ? CREATININE 1.14 (H) 04/19/2021 0545  ? CREATININE 0.85 07/03/2014 0959  ?    ?Component Value Date/Time  ? CALCIUM 7.9 (L) 04/19/2021 0545  ? ALKPHOS 82 04/19/2021 0545  ? AST 18 04/19/2021 0545  ? ALT 18 04/19/2021 0545  ? BILITOT 0.4 04/19/2021 0545  ? BILITOT 0.5 01/11/2018 1542  ?  ? ? ? ? ? ? ?Impression and Plan: ? ?  41 year old woman with: ? ?1.  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  No clear-cut etiology has been identified despite bone marrow biopsy and growth factor support. She continues to receive intermittent packed red cell transfusion and her last Retacrit was on March 3, 20223.  ? ?The differential diagnosis and management options were discussed at this time.  Autoimmune etiologies versus possible aplastic anemia could be considered.  She continues to have persistent anemia despite growth factor support although she has not been very compliant with her follow-up and treatment. ? ?Repeating a bone marrow biopsy as well as obtaining a second opinion at tertiary care center is recommended at this time was discussed with her today.  The other option is to continue with growth factor support for the next few months and if no response then a bone marrow biopsy would be repeated. ? ?After discussion today, we will continue with growth factor support and arrange for a second opinion at [facility] in the near future. ? ?2.  Multiple sclerosis: No recent flareup noted.  She is off steroids. ? ?3.  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 ? ?4.  Chronic renal sufficiency: Her creatinine clearance remains close to normal range at this time. ? ?5.  Follow-up: She will continue to follow every 3 weeks for growth factor support and MD follow-up in 9 weeks. ? ?30  minutes were  spent on this visit.  The time was dedicated to reviewing laboratory data, disease status update, management choices and future plan of care discussion. ? ? ?Zola Button, MD ?3/17/20233:35 PM ? ?

## 2021-05-24 ENCOUNTER — Encounter: Payer: Self-pay | Admitting: *Deleted

## 2021-05-28 ENCOUNTER — Inpatient Hospital Stay: Payer: Medicare HMO

## 2021-06-04 ENCOUNTER — Telehealth: Payer: Self-pay | Admitting: Oncology

## 2021-06-04 NOTE — Telephone Encounter (Signed)
.  Called pt per 3/31 inbasket , Patient was unavailable, a message with appt time and date was left with number on file.    ?

## 2021-06-07 ENCOUNTER — Other Ambulatory Visit: Payer: Self-pay

## 2021-06-07 ENCOUNTER — Inpatient Hospital Stay: Payer: Medicare HMO

## 2021-06-07 ENCOUNTER — Inpatient Hospital Stay: Payer: Medicare HMO | Attending: Physician Assistant

## 2021-06-07 VITALS — BP 106/62 | HR 79 | Temp 99.0°F | Resp 18

## 2021-06-07 DIAGNOSIS — D619 Aplastic anemia, unspecified: Secondary | ICD-10-CM | POA: Diagnosis not present

## 2021-06-07 DIAGNOSIS — D649 Anemia, unspecified: Secondary | ICD-10-CM

## 2021-06-07 DIAGNOSIS — D631 Anemia in chronic kidney disease: Secondary | ICD-10-CM | POA: Diagnosis present

## 2021-06-07 DIAGNOSIS — N1831 Chronic kidney disease, stage 3a: Secondary | ICD-10-CM | POA: Insufficient documentation

## 2021-06-07 LAB — CMP (CANCER CENTER ONLY)
ALT: 17 U/L (ref 0–44)
AST: 12 U/L — ABNORMAL LOW (ref 15–41)
Albumin: 3.6 g/dL (ref 3.5–5.0)
Alkaline Phosphatase: 100 U/L (ref 38–126)
Anion gap: 7 (ref 5–15)
BUN: 13 mg/dL (ref 6–20)
CO2: 28 mmol/L (ref 22–32)
Calcium: 8.8 mg/dL — ABNORMAL LOW (ref 8.9–10.3)
Chloride: 106 mmol/L (ref 98–111)
Creatinine: 1.2 mg/dL — ABNORMAL HIGH (ref 0.44–1.00)
GFR, Estimated: 58 mL/min — ABNORMAL LOW (ref >60)
Glucose, Bld: 84 mg/dL (ref 70–99)
Potassium: 3.1 mmol/L — ABNORMAL LOW (ref 3.5–5.1)
Sodium: 141 mmol/L (ref 135–145)
Total Bilirubin: 0.4 mg/dL (ref 0.3–1.2)
Total Protein: 6.4 g/dL — ABNORMAL LOW (ref 6.5–8.1)

## 2021-06-07 LAB — CBC WITH DIFFERENTIAL (CANCER CENTER ONLY)
Abs Immature Granulocytes: 0.19 10*3/uL — ABNORMAL HIGH (ref 0.00–0.07)
Basophils Absolute: 0 10*3/uL (ref 0.0–0.1)
Basophils Relative: 0 %
Eosinophils Absolute: 0 10*3/uL (ref 0.0–0.5)
Eosinophils Relative: 1 %
HCT: 20.4 % — ABNORMAL LOW (ref 36.0–46.0)
Hemoglobin: 7 g/dL — ABNORMAL LOW (ref 12.0–15.0)
Immature Granulocytes: 5 %
Lymphocytes Relative: 2 %
Lymphs Abs: 0.1 10*3/uL — ABNORMAL LOW (ref 0.7–4.0)
MCH: 29.5 pg (ref 26.0–34.0)
MCHC: 34.3 g/dL (ref 30.0–36.0)
MCV: 86.1 fL (ref 80.0–100.0)
Monocytes Absolute: 0.4 10*3/uL (ref 0.1–1.0)
Monocytes Relative: 11 %
Neutro Abs: 3.1 10*3/uL (ref 1.7–7.7)
Neutrophils Relative %: 81 %
Platelet Count: 238 10*3/uL (ref 150–400)
RBC: 2.37 MIL/uL — ABNORMAL LOW (ref 3.87–5.11)
RDW: 15.7 % — ABNORMAL HIGH (ref 11.5–15.5)
WBC Count: 3.8 10*3/uL — ABNORMAL LOW (ref 4.0–10.5)
nRBC: 0 % (ref 0.0–0.2)

## 2021-06-07 LAB — IRON AND IRON BINDING CAPACITY (CC-WL,HP ONLY)
Iron: 137 ug/dL (ref 28–170)
Saturation Ratios: 102 % — ABNORMAL HIGH (ref 10.4–31.8)
TIBC: 134 ug/dL — ABNORMAL LOW (ref 250–450)
UIBC: UNDETERMINED ug/dL (ref 148–442)

## 2021-06-07 LAB — SAMPLE TO BLOOD BANK

## 2021-06-07 LAB — FERRITIN: Ferritin: 1907 ng/mL — ABNORMAL HIGH (ref 11–307)

## 2021-06-07 MED ORDER — EPOETIN ALFA-EPBX 20000 UNIT/ML IJ SOLN
20000.0000 [IU] | Freq: Once | INTRAMUSCULAR | Status: AC
  Administered 2021-06-07: 20000 [IU] via SUBCUTANEOUS
  Filled 2021-06-07: qty 1

## 2021-06-08 ENCOUNTER — Telehealth: Payer: Self-pay | Admitting: *Deleted

## 2021-06-08 ENCOUNTER — Other Ambulatory Visit: Payer: Self-pay | Admitting: *Deleted

## 2021-06-08 DIAGNOSIS — D649 Anemia, unspecified: Secondary | ICD-10-CM

## 2021-06-08 LAB — ERYTHROPOIETIN: Erythropoietin: 1649 m[IU]/mL — ABNORMAL HIGH (ref 2.6–18.5)

## 2021-06-08 NOTE — Telephone Encounter (Signed)
PC to patient, she is to have blood transfusion, 2 units, this week per Dr. Alen Blew.  Lab appointment for T&S scheduled 06/10/21 @ 10:00, infusion scheduled for 06/11/21 @ 0900.  Patient verbalizes understanding of appointments. ?

## 2021-06-10 ENCOUNTER — Inpatient Hospital Stay: Payer: Medicare HMO

## 2021-06-10 ENCOUNTER — Other Ambulatory Visit: Payer: Self-pay

## 2021-06-10 ENCOUNTER — Other Ambulatory Visit: Payer: Self-pay | Admitting: Family Medicine

## 2021-06-10 ENCOUNTER — Other Ambulatory Visit: Payer: Self-pay | Admitting: *Deleted

## 2021-06-10 DIAGNOSIS — D649 Anemia, unspecified: Secondary | ICD-10-CM

## 2021-06-10 DIAGNOSIS — N1831 Chronic kidney disease, stage 3a: Secondary | ICD-10-CM | POA: Diagnosis not present

## 2021-06-10 DIAGNOSIS — Z1231 Encounter for screening mammogram for malignant neoplasm of breast: Secondary | ICD-10-CM

## 2021-06-11 ENCOUNTER — Other Ambulatory Visit: Payer: Self-pay | Admitting: Oncology

## 2021-06-11 ENCOUNTER — Encounter: Payer: Self-pay | Admitting: Oncology

## 2021-06-11 ENCOUNTER — Inpatient Hospital Stay: Payer: Medicare HMO

## 2021-06-11 ENCOUNTER — Ambulatory Visit: Payer: Medicare HMO

## 2021-06-11 ENCOUNTER — Other Ambulatory Visit: Payer: Medicare HMO

## 2021-06-11 ENCOUNTER — Encounter: Payer: Self-pay | Admitting: *Deleted

## 2021-06-11 ENCOUNTER — Other Ambulatory Visit: Payer: Self-pay | Admitting: *Deleted

## 2021-06-11 DIAGNOSIS — D649 Anemia, unspecified: Secondary | ICD-10-CM

## 2021-06-11 LAB — PREPARE RBC (CROSSMATCH)

## 2021-06-12 ENCOUNTER — Other Ambulatory Visit: Payer: Self-pay

## 2021-06-12 ENCOUNTER — Inpatient Hospital Stay: Payer: Medicare HMO

## 2021-06-12 DIAGNOSIS — D649 Anemia, unspecified: Secondary | ICD-10-CM

## 2021-06-12 DIAGNOSIS — N1831 Chronic kidney disease, stage 3a: Secondary | ICD-10-CM | POA: Diagnosis not present

## 2021-06-12 MED ORDER — DIPHENHYDRAMINE HCL 25 MG PO CAPS
25.0000 mg | ORAL_CAPSULE | Freq: Once | ORAL | Status: AC
  Administered 2021-06-12: 25 mg via ORAL
  Filled 2021-06-12: qty 1

## 2021-06-12 MED ORDER — ACETAMINOPHEN 325 MG PO TABS
650.0000 mg | ORAL_TABLET | Freq: Once | ORAL | Status: AC
  Administered 2021-06-12: 650 mg via ORAL
  Filled 2021-06-12: qty 2

## 2021-06-12 MED ORDER — SODIUM CHLORIDE 0.9% IV SOLUTION: 250.0000 mL | Freq: Once | INTRAVENOUS | Status: AC

## 2021-06-12 MED ORDER — SODIUM CHLORIDE 0.9% IV SOLUTION
250.0000 mL | Freq: Once | INTRAVENOUS | Status: AC
  Administered 2021-06-12: 250 mL via INTRAVENOUS

## 2021-06-14 ENCOUNTER — Inpatient Hospital Stay: Admission: RE | Admit: 2021-06-14 | Payer: Medicare HMO | Source: Ambulatory Visit

## 2021-06-15 ENCOUNTER — Telehealth: Payer: Self-pay | Admitting: *Deleted

## 2021-06-15 LAB — BPAM RBC
Blood Product Expiration Date: 202305022359
Blood Product Expiration Date: 202305092359
ISSUE DATE / TIME: 202304081023
ISSUE DATE / TIME: 202304081128
Unit Type and Rh: 7300
Unit Type and Rh: 7300

## 2021-06-15 LAB — TYPE AND SCREEN
ABO/RH(D): B POS
Antibody Screen: POSITIVE
DAT, IgG: POSITIVE
Unit division: 0
Unit division: 0

## 2021-06-15 NOTE — Telephone Encounter (Signed)
CMM advised unable to complete PA on their portal. Need to call directly at [phone]/[phone]. I called and spoke w/ Shanon Brow. Showing active coverage for pt.   ?

## 2021-06-15 NOTE — Telephone Encounter (Signed)
Initiated PA dalfampridine on CMM. Key: [id number]. Waiting on questions to become available from insurance to proceed. ?

## 2021-06-17 ENCOUNTER — Telehealth: Payer: Self-pay | Admitting: *Deleted

## 2021-06-17 NOTE — Telephone Encounter (Signed)
Submitted PA Zeposia on CMM. Key: [id number]. Waiting on determination from [place].  ?

## 2021-06-17 NOTE — Telephone Encounter (Signed)
PA dalfampridine submitted on CMM. Key: [id number]. Received instant approval: "PA Case: [id number], Status: Approved, Coverage Starts on: 03/07/2021 12:00:00 AM, Coverage Ends on: 03/06/2022 12:00:00 AM. Questions? Contact [phone]." ?

## 2021-06-18 ENCOUNTER — Inpatient Hospital Stay: Payer: Medicare HMO

## 2021-06-18 ENCOUNTER — Other Ambulatory Visit: Payer: Self-pay

## 2021-06-18 VITALS — BP 105/71 | HR 78 | Temp 98.6°F | Resp 18

## 2021-06-18 DIAGNOSIS — D649 Anemia, unspecified: Secondary | ICD-10-CM

## 2021-06-18 DIAGNOSIS — N1831 Chronic kidney disease, stage 3a: Secondary | ICD-10-CM

## 2021-06-18 LAB — CBC WITH DIFFERENTIAL (CANCER CENTER ONLY)
Abs Immature Granulocytes: 0.32 10*3/uL — ABNORMAL HIGH (ref 0.00–0.07)
Basophils Absolute: 0 10*3/uL (ref 0.0–0.1)
Basophils Relative: 0 %
Eosinophils Absolute: 0.1 10*3/uL (ref 0.0–0.5)
Eosinophils Relative: 2 %
HCT: 25.4 % — ABNORMAL LOW (ref 36.0–46.0)
Hemoglobin: 8.2 g/dL — ABNORMAL LOW (ref 12.0–15.0)
Immature Granulocytes: 6 %
Lymphocytes Relative: 3 %
Lymphs Abs: 0.1 10*3/uL — ABNORMAL LOW (ref 0.7–4.0)
MCH: 27.6 pg (ref 26.0–34.0)
MCHC: 32.3 g/dL (ref 30.0–36.0)
MCV: 85.5 fL (ref 80.0–100.0)
Monocytes Absolute: 0.7 10*3/uL (ref 0.1–1.0)
Monocytes Relative: 15 %
Neutro Abs: 3.7 10*3/uL (ref 1.7–7.7)
Neutrophils Relative %: 74 %
Platelet Count: 213 10*3/uL (ref 150–400)
RBC: 2.97 MIL/uL — ABNORMAL LOW (ref 3.87–5.11)
RDW: 16.9 % — ABNORMAL HIGH (ref 11.5–15.5)
Smear Review: NORMAL
WBC Count: 5 10*3/uL (ref 4.0–10.5)
nRBC: 0 % (ref 0.0–0.2)

## 2021-06-18 LAB — SAMPLE TO BLOOD BANK

## 2021-06-18 MED ORDER — EPOETIN ALFA-EPBX 20000 UNIT/ML IJ SOLN
20000.0000 [IU] | Freq: Once | INTRAMUSCULAR | Status: AC
  Administered 2021-06-18: 20000 [IU] via SUBCUTANEOUS
  Filled 2021-06-18: qty 1

## 2021-06-18 NOTE — Patient Instructions (Signed)
Epoetin Alfa injection ?What is this medication? ?EPOETIN ALFA (e POE e tin AL fa) helps your body make more red blood cells. This medicine is used to treat anemia caused by chronic kidney disease, cancer chemotherapy, or HIV-therapy. It may also be used before surgery if you have anemia. ?This medicine may be used for other purposes; ask your health care provider or pharmacist if you have questions. ?COMMON BRAND NAME(S): Epogen, Procrit, Retacrit ?What should I tell my care team before I take this medication? ?They need to know if you have any of these conditions: ?cancer ?heart disease ?high blood pressure ?history of blood clots ?history of stroke ?low levels of folate, iron, or vitamin B12 in the blood ?seizures ?an unusual or allergic reaction to erythropoietin, albumin, benzyl alcohol, hamster proteins, other medicines, foods, dyes, or preservatives ?pregnant or trying to get pregnant ?breast-feeding ?How should I use this medication? ?This medicine is for injection into a vein or under the skin. It is usually given by a health care professional in a hospital or clinic setting. ?If you get this medicine at home, you will be taught how to prepare and give this medicine. Use exactly as directed. Take your medicine at regular intervals. Do not take your medicine more often than directed. ?It is important that you put your used needles and syringes in a special sharps container. Do not put them in a trash can. If you do not have a sharps container, call your pharmacist or healthcare provider to get one. ?A special MedGuide will be given to you by the pharmacist with each prescription and refill. Be sure to read this information carefully each time. ?Talk to your pediatrician regarding the use of this medicine in children. While this drug may be prescribed for selected conditions, precautions do apply. ?Overdosage: If you think you have taken too much of this medicine contact a poison control center or emergency  room at once. ?NOTE: This medicine is only for you. Do not share this medicine with others. ?What if I miss a dose? ?If you miss a dose, take it as soon as you can. If it is almost time for your next dose, take only that dose. Do not take double or extra doses. ?What may interact with this medication? ?Interactions have not been studied. ?This list may not describe all possible interactions. Give your health care provider a list of all the medicines, herbs, non-prescription drugs, or dietary supplements you use. Also tell them if you smoke, drink alcohol, or use illegal drugs. Some items may interact with your medicine. ?What should I watch for while using this medication? ?Your condition will be monitored carefully while you are receiving this medicine. ?You may need blood work done while you are taking this medicine. ?This medicine may cause a decrease in vitamin B6. You should make sure that you get enough vitamin B6 while you are taking this medicine. Discuss the foods you eat and the vitamins you take with your health care professional. ?What side effects may I notice from receiving this medication? ?Side effects that you should report to your doctor or health care professional as soon as possible: ?allergic reactions like skin rash, itching or hives, swelling of the face, lips, or tongue ?seizures ?signs and symptoms of a blood clot such as breathing problems; changes in vision; chest pain; severe, sudden headache; pain, swelling, warmth in the leg; trouble speaking; sudden numbness or weakness of the face, arm or leg ?signs and symptoms of a stroke like   changes in vision; confusion; trouble speaking or understanding; severe headaches; sudden numbness or weakness of the face, arm or leg; trouble walking; dizziness; loss of balance or coordination ?Side effects that usually do not require medical attention (report to your doctor or health care professional if they continue or are  bothersome): ?chills ?cough ?dizziness ?fever ?headaches ?joint pain ?muscle cramps ?muscle pain ?nausea, vomiting ?pain, redness, or irritation at site where injected ?This list may not describe all possible side effects. Call your doctor for medical advice about side effects. You may report side effects to FDA at 1-800-FDA-1088. ?Where should I keep my medication? ?Keep out of the reach of children. ?Store in a refrigerator between 2 and 8 degrees C (36 and 46 degrees F). Do not freeze or shake. Throw away any unused portion if using a single-dose vial. Multi-dose vials can be kept in the refrigerator for up to 21 days after the initial dose. Throw away unused medicine. ?NOTE: This sheet is a summary. It may not cover all possible information. If you have questions about this medicine, talk to your doctor, pharmacist, or health care provider. ?? 2023 Elsevier/Gold Standard (2016-10-25 00:00:00) ? ?

## 2021-06-21 NOTE — Telephone Encounter (Signed)
Received fax from [facility] that [place] approved 03/07/21-03/06/22.  Ref# [id number].  ?

## 2021-06-22 ENCOUNTER — Other Ambulatory Visit: Payer: Self-pay | Admitting: Neurology

## 2021-06-25 ENCOUNTER — Telehealth: Payer: Self-pay | Admitting: Oncology

## 2021-06-25 NOTE — Telephone Encounter (Signed)
Called patient regarding upcoming appointment, patient's voicemail is full. Calender will be mailed. ?

## 2021-06-30 ENCOUNTER — Other Ambulatory Visit: Payer: Self-pay | Admitting: Neurology

## 2021-06-30 ENCOUNTER — Encounter: Payer: Self-pay | Admitting: Oncology

## 2021-06-30 NOTE — Telephone Encounter (Signed)
Called pt. Offered work in appt for 07/01/21 at [facility] with Dr. Felecia Shelling. She accepted. I scheduled and asked that she bring updated insurance cards and med list. She verbalized understanding.  ?

## 2021-07-01 ENCOUNTER — Telehealth (INDEPENDENT_AMBULATORY_CARE_PROVIDER_SITE_OTHER): Payer: Medicare HMO | Admitting: Neurology

## 2021-07-01 ENCOUNTER — Encounter: Payer: Self-pay | Admitting: Neurology

## 2021-07-01 DIAGNOSIS — R269 Unspecified abnormalities of gait and mobility: Secondary | ICD-10-CM

## 2021-07-01 DIAGNOSIS — N399 Disorder of urinary system, unspecified: Secondary | ICD-10-CM

## 2021-07-01 DIAGNOSIS — G35 Multiple sclerosis: Secondary | ICD-10-CM

## 2021-07-01 DIAGNOSIS — N183 Chronic kidney disease, stage 3 unspecified: Secondary | ICD-10-CM

## 2021-07-01 DIAGNOSIS — M21372 Foot drop, left foot: Secondary | ICD-10-CM

## 2021-07-01 DIAGNOSIS — R5383 Other fatigue: Secondary | ICD-10-CM

## 2021-07-01 MED ORDER — PREDNISONE 50 MG PO TABS: ORAL_TABLET | ORAL | 0 refills | Status: DC

## 2021-07-01 NOTE — Telephone Encounter (Signed)
Called pt. She was agreeable to change appt to mychart VV. I converted appt. She will go ahead and get logged on now. ?

## 2021-07-01 NOTE — Progress Notes (Signed)
? ? ?[facility] ? ?PATIENT: Dawn Peters ?DOB: 1980-10-02 ? ?REFERRING DOCTOR OR PCP:  Dr. Annitta Needs ?SOURCE: Patient, records in EMR, lab results and radiology reports in EMR, MRI images reviewed on PACS. ? ?_________________________________ ? ? ?HISTORICAL ? ?CHIEF COMPLAINT:  ?Chief Complaint  ?Patient presents with  ? Multiple Sclerosis  ?  On Zeposia currenlty, also 4 years out from [place]  ? ?Virtual Visit via Video Note ?I connected with Dawn Peters on 07/01/21 at 10:00 AM EDT by a video enabled telemedicine application and verified that I am speaking with the correct person.  I discussed the limitations of evaluation and management by telemedicine and the availability of in person appointments. The patient expressed understanding and agreed to proceed. ? ?Patient was home.  Provider in office ? ?HISTORY OF PRESENT ILLNESS:  ?Dawn Peters is a 41 y.o. woman who was diagnosed with relapsing remitting MS in 2000.    ? ?Update 07/01/2021: ?She had two falls in last week as left leg gives out.   She feels she is having a relapse -- in past she has had multiple similar episodes without new lesions on MRI.  She usually feels stronger after 2-3 days Solumedrol.   ?She is tolerating Zeposia well and has no exacerbation however lymphocytes are very low.  .    She was on [place] and recently completed her monthly REMS labs (last dose on Lemtrada August 2018).  In between she was also on Ocrevus.    Her lymphocyte count has been 0.1 the last 2 month (was 0.2 and 0.3 earlier in year -- Zeposia typically has 50-70% reduction in lymphocyte count so 0.3-0.6 is common).  This was on every other day Zeposia.    She has required transfusions multiple times.     She was found to have normocytic, normochromic and hypoproliferative component anemia.   It did not appear to be autoimmune (has been on Lemtrada in past which has had reported cases of AIHA). ? ?She has left leg weakness and  spasticity and uses a cane.  She is able to walk about 100 feet with her cane, about the same as earlier this year.  However, the left leg often gives out and she has falls  She has had progression with more left leg weakness the past few years but is stable compared to her last visit.  She has lost some weight. ? ?Dysesthesias are better on  gabapentin.      She has urinary urgency and hesitancy.  She is on tamsulosin.   She sees urology.   ? ?She has stage 3 CKD and sees Nephrology at [facility]  ? ?Vision is stable.   ? ?Her fatigue is helped by Adderall.  She sleeps well most nights.  Depression and anxiety are better with the citalopram and anxiety is better with BuSpar .   She is taking vitamin D for her deficiency. ? ?She has 2-3 migraines a month, helped by sumatriptan.   She continues to report back pain and left leg pain that radiates.  She was seeing  [facility] annd would like a referral to a differntn provider.    ? ?MS history:   She was diagnosed with MS in 2000 after presenting with gait difficulties, numbness and headaches. An MRI of the brain was consistent with MS. She was then started on Betaseron. She had difficulty tolerating Betaseron and at some point switched to Novantrone. She took  Novantrone every 3 months for a year or so. A little later, she was placed on Tysabri and she stayed on it for a couple of years. However, she was JCV-positive and switched off. She did well on Tysabri. She had been on Tecfidera since 2015. Her MRI from June 2015 showed that there had been no changes compared to an MRI from 2014.   However, she had an exacerbation March 2017.   The 08/07/2013 MRI of the brain shows multiple T2/FLAIR hyperintense foci located in the cerebellum, middle cerebellar peduncles, pons and in the periventricular white matter of the hemispheres in a pattern and configuration consistent with chronic demyelinating plaque associated with MS. There  were no acute findings. There was no change compared to the previous MRI from 01/16/2013 that was also reviewed. She switched to [place] in July,2017/Aug 2018.   Due to progression she was shortly on Ocrevus in 2020 and then switched to Zeposia January 2021. Due to persistent low lymphocytes (even on qod dosing), Zeposia was dicontinued 07/01/2021  ? ?Physical Exam ?She is a well-developed well-nourished woman in no acute distress.  The head is normocephalic and atraumatic.  Sclera are anicteric.  Visible skin appears normal.  The neck has a good range of motion. She is alert and fully oriented with fluent speech and good attention, knowledge and memory.  Extraocular muscles are intact.  Facial strength is normal.  She appears to have normal strength in the arms.  Rapid alternating movements and finger-nose-finger are performed well. ? ? ?IMAGING: ? ?MRI cervical spine 08/31/2020 showed ubtle T2 hyperintense signal within the cord and there were no acute findings. ? ?MRI brain 08/31/2020 showed Unchanged distribution of white matter lesions in a pattern compatible with multiple sclerosis. No active demyelinating lesions. ? ?REVIEW OF SYSTEMS: ?Constitutional: No fevers, chills, sweats, or change in appetite.   She has fatigue.  Some insomnia. ?Eyes: No visual changes, double vision, eye pain ?Ear, nose and throat: No hearing loss, ear pain, nasal congestion, sore throat ?Cardiovascular: No chest pain, palpitations ?Respiratory:  No shortness of breath at rest or with exertion.   No wheezes ?GastrointestinaI: No nausea, vomiting, diarrhea, abdominal pain, fecal incontinence ?Genitourinary:  No dysuria.   She has hesitancy. Sometimes she has difficulty emptying completely Flomax has helped urinary hesitancy. 1 x nocturia. ?Musculoskeletal: She notes pain in the left leg and some back pain ?Integumentary: No rash, pruritus, skin lesions ?Neurological: as above ?Psychiatric: Mild depression at this time.  Moderate  anxiety ?Endocrine: No palpitations, diaphoresis, change in appetite, change in weigh or increased thirst ?Hematologic/Lymphatic:  No anemia, purpura, petechiae. ?Allergic/Immunologic: No itchy/runny eyes, nasal congestion, recent allergic reactions, rashes ? ?ALLERGIES: ?Allergies  ?Allergen Reactions  ? Ace Inhibitors Swelling  ? Amoxicillin Itching  ?  Did it involve swelling of the face/tongue/throat, SOB, or low BP? Yes ?Did it involve sudden or severe rash/hives, skin peeling, or any reaction on the inside of your mouth or nose? No ?Did you need to seek medical attention at a hospital or doctor's office? No ?When did it last happen?      unknown  ?If all above answers are ?NO?, may proceed with cephalosporin use.;  ? Lisinopril Swelling  ?  Lower lip swelling  ? ? ?HOME MEDICATIONS: ? ?Current Outpatient Medications:  ?  predniSONE (DELTASONE) 50 MG tablet, 12 pills (600 mg) po daily x 3 days for MS exacerbation, Disp: 36 tablet, Rfl: 0 ?  acetaminophen (TYLENOL) 500 MG tablet, Take 1,000 mg by  mouth every 6 (six) hours as needed for moderate pain or headache., Disp: , Rfl:  ?  amantadine (SYMMETREL) 100 MG capsule, Take 100 mg by mouth 2 (two) times daily., Disp: , Rfl:  ?  amitriptyline (ELAVIL) 25 MG tablet, TAKE 1 TO 2 TABLETS(25 TO 50 MG) BY MOUTH AT BEDTIME, Disp: 180 tablet, Rfl: 0 ?  amLODipine (NORVASC) 5 MG tablet, Take 1 tablet (5 mg total) by mouth daily. (Patient taking differently: Take 5 mg by mouth daily after breakfast.), Disp: 90 tablet, Rfl: 0 ?  amphetamine-dextroamphetamine (ADDERALL XR) 20 MG 24 hr capsule, Take 1 capsule (20 mg total) by mouth daily., Disp: 30 capsule, Rfl: 0 ?  apixaban (ELIQUIS) 5 MG TABS tablet, Take 1 tablet (5 mg total) by mouth 2 (two) times daily., Disp: 60 tablet, Rfl: 5 ?  atorvastatin (LIPITOR) 10 MG tablet, Take 10 mg by mouth daily., Disp: , Rfl:  ?  baclofen (LIORESAL) 10 MG tablet, Take 1 tablet (10 mg total) by mouth 4 (four) times daily., Disp: 360  tablet, Rfl: 1 ?  busPIRone (BUSPAR) 15 MG tablet, Take 1 tablet (15 mg total) by mouth 2 (two) times daily., Disp: 60 tablet, Rfl: 3 ?  citalopram (CELEXA) 20 MG tablet, Take 1 tablet (20 mg total) by mouth daily., D

## 2021-07-05 ENCOUNTER — Ambulatory Visit
Admission: RE | Admit: 2021-07-05 | Discharge: 2021-07-05 | Disposition: A | Discharge status: Home / Self Care | Payer: Medicare HMO | Source: Ambulatory Visit | Attending: Family Medicine | Admitting: Family Medicine

## 2021-07-05 DIAGNOSIS — Z1231 Encounter for screening mammogram for malignant neoplasm of breast: Secondary | ICD-10-CM

## 2021-07-10 ENCOUNTER — Encounter (HOSPITAL_COMMUNITY): Payer: Self-pay

## 2021-07-10 ENCOUNTER — Other Ambulatory Visit: Payer: Self-pay

## 2021-07-10 ENCOUNTER — Encounter: Payer: Self-pay | Admitting: Oncology

## 2021-07-10 ENCOUNTER — Emergency Department (HOSPITAL_COMMUNITY)
Admission: EM | Admit: 2021-07-10 | Discharge: 2021-07-10 | Disposition: A | Discharge status: Home / Self Care | Payer: Medicare HMO | Attending: Emergency Medicine | Admitting: Emergency Medicine

## 2021-07-10 DIAGNOSIS — Z79899 Other long term (current) drug therapy: Secondary | ICD-10-CM | POA: Insufficient documentation

## 2021-07-10 DIAGNOSIS — Z96643 Presence of artificial hip joint, bilateral: Secondary | ICD-10-CM | POA: Insufficient documentation

## 2021-07-10 DIAGNOSIS — D649 Anemia, unspecified: Secondary | ICD-10-CM | POA: Diagnosis not present

## 2021-07-10 DIAGNOSIS — R5383 Other fatigue: Secondary | ICD-10-CM | POA: Diagnosis present

## 2021-07-10 DIAGNOSIS — I129 Hypertensive chronic kidney disease with stage 1 through stage 4 chronic kidney disease, or unspecified chronic kidney disease: Secondary | ICD-10-CM | POA: Diagnosis not present

## 2021-07-10 DIAGNOSIS — N183 Chronic kidney disease, stage 3 unspecified: Secondary | ICD-10-CM | POA: Insufficient documentation

## 2021-07-10 DIAGNOSIS — R42 Dizziness and giddiness: Secondary | ICD-10-CM | POA: Insufficient documentation

## 2021-07-10 LAB — BASIC METABOLIC PANEL
Anion gap: 10 (ref 5–15)
BUN: 25 mg/dL — ABNORMAL HIGH (ref 6–20)
CO2: 23 mmol/L (ref 22–32)
Calcium: 8.5 mg/dL — ABNORMAL LOW (ref 8.9–10.3)
Chloride: 107 mmol/L (ref 98–111)
Creatinine, Ser: 1.44 mg/dL — ABNORMAL HIGH (ref 0.44–1.00)
GFR, Estimated: 46 mL/min — ABNORMAL LOW (ref >60)
Glucose, Bld: 115 mg/dL — ABNORMAL HIGH (ref 70–99)
Potassium: 3.7 mmol/L (ref 3.5–5.1)
Sodium: 140 mmol/L (ref 135–145)

## 2021-07-10 LAB — CBC WITH DIFFERENTIAL/PLATELET
Abs Immature Granulocytes: 1.3 10*3/uL — ABNORMAL HIGH (ref 0.00–0.07)
Basophils Absolute: 0 10*3/uL (ref 0.0–0.1)
Basophils Relative: 0 %
Eosinophils Absolute: 0 10*3/uL (ref 0.0–0.5)
Eosinophils Relative: 0 %
HCT: 21.5 % — ABNORMAL LOW (ref 36.0–46.0)
Hemoglobin: 6.6 g/dL — CL (ref 12.0–15.0)
Lymphocytes Relative: 1 %
Lymphs Abs: 0.1 10*3/uL — ABNORMAL LOW (ref 0.7–4.0)
MCH: 28.2 pg (ref 26.0–34.0)
MCHC: 30.7 g/dL (ref 30.0–36.0)
MCV: 91.9 fL (ref 80.0–100.0)
Metamyelocytes Relative: 1 %
Monocytes Absolute: 1 10*3/uL (ref 0.1–1.0)
Monocytes Relative: 8 %
Myelocytes: 8 %
Neutro Abs: 10.4 10*3/uL — ABNORMAL HIGH (ref 1.7–7.7)
Neutrophils Relative %: 81 %
Platelets: 204 10*3/uL (ref 150–400)
Promyelocytes Relative: 1 %
RBC: 2.34 MIL/uL — ABNORMAL LOW (ref 3.87–5.11)
RDW: 17.5 % — ABNORMAL HIGH (ref 11.5–15.5)
WBC: 12.8 10*3/uL — ABNORMAL HIGH (ref 4.0–10.5)
nRBC: 0.3 % — ABNORMAL HIGH (ref 0.0–0.2)

## 2021-07-10 LAB — URINALYSIS, ROUTINE W REFLEX MICROSCOPIC
Bilirubin Urine: NEGATIVE
Glucose, UA: >=500 mg/dL — AB
Hgb urine dipstick: NEGATIVE
Ketones, ur: NEGATIVE mg/dL
Nitrite: NEGATIVE
Protein, ur: NEGATIVE mg/dL
Specific Gravity, Urine: 1.022 (ref 1.005–1.030)
pH: 5 (ref 5.0–8.0)

## 2021-07-10 NOTE — ED Notes (Signed)
Attempted to straight stick patient for type and screen, unsuccessful.  ?

## 2021-07-10 NOTE — ED Provider Notes (Signed)
? ?[facility] DEPT ?Provider Note: Georgena Spurling, MD, [place] ? ?CSN: [id number] ?MRN: [id number] ?ARRIVAL: 07/10/21 at [place] ?ROOM: WTR2/WLPT2 ? ? ?CHIEF COMPLAINT  ?Fatigue ? ? ?HISTORY OF PRESENT ILLNESS  ?07/10/21 6:32 AM ?Dawn Peters is a 40 y.o. female with a history of anemia and frequent blood transfusions who is followed by Dr. Alen Blew at the cancer center.  She got her blood checked by her PCP yesterday and they called her and told her she had a low hemoglobin (6).  It had previously been 7.2 several weeks ago.  She feels very fatigued, especially with exertion, and occasionally dizzy (lightheaded).  She denies pain. ? ? ?Past Medical History:  ?Diagnosis Date  ? Anticoagulant long-term use   ? Eliquis  ? Anxiety   ? Chronic pain   ? CKD (chronic kidney disease), stage III ([place])   ? Depression   ? 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 left lower extremity-- treated with eliquis  ? Gait disturbance   ? due to MS  ? GERD (gastroesophageal reflux disease)   ? watch diet  ? History of avascular necrosis of capital femoral epiphysis   ? bilateral due to MS treatment's  s/p  THA  ? History of DVT of lower extremity 2007  ? History of encephalopathy 40/8144  ? acute metabolic encephalopathy secondary to MS,  resolved  ? History of MRSA infection 2004  ? right hip infection post THA  ? History of pulmonary embolus (PE) 2005  ? Hydronephrosis, left   ? w/ acute kidney injury 02/ 2019  ? Hypertension   ? Left-sided weakness   ? due to MS  ? Migraines   ? MS (multiple sclerosis) [facility]) neurologist-  dr Renne Musca  ? dx 2000--  ? Muscle spasticity   ? Neurogenic bladder   ? due to MS  ? Pulmonary embolism, bilateral ([place]) 09/17/2017  ? treated w/ eliquis  ? Strains to urinate   ? Urgency of urination   ? ? ?Past Surgical History:  ?Procedure Laterality Date  ? CYSTOSCOPY WITH RETROGRADE PYELOGRAM, URETEROSCOPY AND STENT PLACEMENT Bilateral 11/29/2017  ? Procedure: BILATERAL  RETROGRADE PYELOGRAM, LEFT  DIAGNOSIC URETEROSCOPY AND STENT LEFT PLACEMENT;  Surgeon: Ardis Hughs, MD;  Location: [facility];  Service: Urology;  Laterality: Bilateral;  ? HIP ARTHROSCOPY Left 07-05-2013   dr pill '@[facility]'$   ? iliopsosas release, synovectomy  ? REVISION TOTAL HIP ARTHROPLASTY Right 03-28-2003    dr Alvan Dame '@[facility]'$   ? TOTAL HIP ARTHROPLASTY Bilateral left 11-05-2002  dr Alvan Dame '@[facility]'$ ;   right 06/ 2004  '@[facility]   ? ? ?Family History  ?Problem Relation Age of Onset  ? Healthy Mother   ? Healthy Father   ? Colon cancer Paternal Grandmother   ? Breast cancer Paternal Grandmother 67  ? Hypertension Other   ? Diabetes Other   ? ? ?Social History  ? ?Tobacco Use  ? Smoking status: Never  ? Smokeless tobacco: Never  ?Vaping Use  ? Vaping Use: Never used  ?Substance Use Topics  ? Alcohol use: No  ? Drug use: No  ? ? ?Prior to Admission medications   ?Medication Sig Start Date End Date Taking? Authorizing Provider  ?acetaminophen (TYLENOL) 500 MG tablet Take 1,000 mg by mouth every 6 (six) hours as needed for moderate pain or headache.    [provider]  ?amantadine (SYMMETREL) 100 MG capsule Take 100 mg by mouth 2 (two) times daily. 09/27/20   [provider]  ?  amitriptyline (ELAVIL) 25 MG tablet TAKE 1 TO 2 TABLETS(25 TO 50 MG) BY MOUTH AT BEDTIME 05/10/21   Sater, Nanine Means, MD  ?amLODipine (NORVASC) 5 MG tablet Take 1 tablet (5 mg total) by mouth daily. ?Patient taking differently: Take 5 mg by mouth daily after breakfast. 05/21/17   Arrien, Jimmy Picket, MD  ?amphetamine-dextroamphetamine (ADDERALL XR) 20 MG 24 hr capsule Take 1 capsule (20 mg total) by mouth daily. 04/29/21   Star Age, MD  ?apixaban (ELIQUIS) 5 MG TABS tablet Take 1 tablet (5 mg total) by mouth 2 (two) times daily. 06/22/21   Sater, Nanine Means, MD  ?atorvastatin (LIPITOR) 10 MG tablet Take 10 mg by mouth daily. 12/24/20   [provider]  ?baclofen (LIORESAL) 10 MG tablet Take 1 tablet (10 mg total) by mouth 4 (four) times  daily. 03/12/21   Sater, Nanine Means, MD  ?busPIRone (BUSPAR) 15 MG tablet Take 1 tablet (15 mg total) by mouth 2 (two) times daily. 04/21/21   Sater, Nanine Means, MD  ?citalopram (CELEXA) 20 MG tablet Take 1 tablet (20 mg total) by mouth daily. 02/19/19   Sater, Nanine Means, MD  ?dalfampridine 10 MG TB12 Take 1 tablet (10 mg total) by mouth 2 (two) times daily. 09/21/20   Sater, Nanine Means, MD  ?folic acid (FOLVITE) 1 MG tablet Take 1 tablet (1 mg total) by mouth daily. 04/19/21   Regalado, Belkys A, MD  ?gabapentin (NEURONTIN) 800 MG tablet Take 1 tablet (800 mg total) by mouth 4 (four) times daily. 09/21/20   Sater, Nanine Means, MD  ?JARDIANCE 10 MG TABS tablet Take 10 mg by mouth daily. 03/14/21   [provider]  ?Rolan Lipa 72 MCG capsule Take 72 mcg by mouth every morning. 02/18/21   [provider]  ?metoprolol succinate (TOPROL-XL) 50 MG 24 hr tablet Take 50 mg by mouth daily. 09/27/20   [provider]  ?Multiple Vitamin (MULTIVITAMIN WITH MINERALS) TABS tablet Take 1 tablet by mouth daily. 09/20/17   Kayleen Memos, DO  ?norethindrone (MICRONOR) 0.35 MG tablet Take 1 tablet (0.35 mg total) by mouth daily. 11/19/18   Clarnce Flock, MD  ?omeprazole (PRILOSEC) 40 MG capsule Take 40 mg by mouth daily. 09/27/20   [provider]  ?Ozanimod HCl (ZEPOSIA) 0.92 MG CAPS Take 1 capsule (0.92 mg total) by mouth daily. 04/05/21   Sater, Nanine Means, MD  ?predniSONE (DELTASONE) 50 MG tablet 12 pills (600 mg) po daily x 3 days for MS exacerbation 07/01/21   Sater, Nanine Means, MD  ?SUMAtriptan (IMITREX) 100 MG tablet TAKE 1 TABLET BY MOUTH 1 TIME FOR UP TO 1 DOSE AS NEEDED FOR MIGRAINE. MAY REPEAT IN 2 HOURS IF HEADACHE PERSISTS OR RECURS ?Patient taking differently: Take 100 mg by mouth daily as needed for migraine or headache. 09/21/20   Sater, Nanine Means, MD  ?tamsulosin (FLOMAX) 0.4 MG CAPS capsule TAKE 1 CAPSULE(0.4 MG) BY MOUTH DAILY ?Patient taking differently: Take 0.4 mg by mouth daily. 08/05/20    Sater, Nanine Means, MD  ?tiZANidine (ZANAFLEX) 4 MG tablet TAKE 1 TABLET BY MOUTH EVERY NIGHT AT BEDTIME ?Patient taking differently: Take 4 mg by mouth at bedtime. 08/05/20   Sater, Nanine Means, MD  ?vitamin B-12 (CYANOCOBALAMIN) 1000 MCG tablet Take 1 tablet (1,000 mcg total) by mouth daily. 04/20/21   Regalado, Belkys A, MD  ?Oxcarbazepine (TRILEPTAL) 300 MG tablet Take 1 tablet (300 mg total) by mouth 2 (two) times daily. ?Patient not taking: Reported on 07/19/2019  11/11/15 07/19/19  Sater, Nanine Means, MD  ? ? ?Allergies ?Ace inhibitors, Amoxicillin, and Lisinopril ? ? ?REVIEW OF SYSTEMS  ?Negative except as noted here or in the History of Present Illness. ? ? ?PHYSICAL EXAMINATION  ?Initial Vital Signs ?Blood pressure 127/70, pulse 89, temperature 98.2 ?F (36.8 ?C), temperature source Oral, resp. rate 19, height '5\' 6"'$  (1.676 m), weight 95.3 kg, SpO2 100 %. ? ?Examination ?General: Well-developed, well-nourished female in no acute distress; appearance consistent with age of record ?HENT: normocephalic; atraumatic ?Eyes: Pale conjunctivae ?Neck: supple ?Heart: regular rate and rhythm ?Lungs: clear to auscultation bilaterally ?Abdomen: soft; nondistended; nontender; bowel sounds present ?Extremities: No deformity; full range of motion ?Neurologic: Awake, alert and oriented; motor function intact in all extremities and symmetric; no facial droop ?Skin: Warm and dry ?Psychiatric: Normal mood and affect ? ? ?RESULTS  ?Summary of this visit's results, reviewed and interpreted by myself: ? ? EKG Interpretation ? ?Date/Time:    ?Ventricular Rate:    ?PR Interval:    ?QRS Duration:   ?QT Interval:    ?QTC Calculation:   ?R Axis:     ?Text Interpretation:   ?  ? ?  ? ?Laboratory Studies: ?Results for orders placed or performed during the hospital encounter of 07/10/21 (from the past 24 hour(s))  ?Basic metabolic panel     Status: Abnormal  ? Collection Time: 07/10/21  4:42 AM  ?Result Value Ref Range  ? Sodium 140 135 - 145 mmol/L  ?  Potassium 3.7 3.5 - 5.1 mmol/L  ? Chloride 107 98 - 111 mmol/L  ? CO2 23 22 - 32 mmol/L  ? Glucose, Bld 115 (H) 70 - 99 mg/dL  ? BUN 25 (H) 6 - 20 mg/dL  ? Creatinine, Ser 1.44 (H) 0.44 - 1.00 mg/dL  ? Calcium

## 2021-07-10 NOTE — ED Triage Notes (Addendum)
Patient got her blood checked yesterday at her primary care and today they called her with a low hemoglobin was 6. A couple of weeks ago it was 7.2. She said she feels very fatigued. When she stands she feels like she wants to pass out. ?

## 2021-07-12 ENCOUNTER — Other Ambulatory Visit: Payer: Self-pay

## 2021-07-12 ENCOUNTER — Encounter (HOSPITAL_COMMUNITY): Payer: Self-pay

## 2021-07-12 ENCOUNTER — Emergency Department (HOSPITAL_COMMUNITY)
Admission: EM | Admit: 2021-07-12 | Discharge: 2021-07-12 | Disposition: A | Discharge status: Home / Self Care | Payer: Medicare HMO | Attending: Emergency Medicine | Admitting: Emergency Medicine

## 2021-07-12 DIAGNOSIS — Z7901 Long term (current) use of anticoagulants: Secondary | ICD-10-CM | POA: Insufficient documentation

## 2021-07-12 DIAGNOSIS — R600 Localized edema: Secondary | ICD-10-CM | POA: Insufficient documentation

## 2021-07-12 DIAGNOSIS — D649 Anemia, unspecified: Secondary | ICD-10-CM | POA: Diagnosis present

## 2021-07-12 DIAGNOSIS — Z96643 Presence of artificial hip joint, bilateral: Secondary | ICD-10-CM | POA: Diagnosis not present

## 2021-07-12 DIAGNOSIS — Z79899 Other long term (current) drug therapy: Secondary | ICD-10-CM | POA: Insufficient documentation

## 2021-07-12 DIAGNOSIS — I129 Hypertensive chronic kidney disease with stage 1 through stage 4 chronic kidney disease, or unspecified chronic kidney disease: Secondary | ICD-10-CM | POA: Insufficient documentation

## 2021-07-12 DIAGNOSIS — N183 Chronic kidney disease, stage 3 unspecified: Secondary | ICD-10-CM | POA: Diagnosis not present

## 2021-07-12 LAB — CBC WITH DIFFERENTIAL/PLATELET
Abs Immature Granulocytes: 1.11 10*3/uL — ABNORMAL HIGH (ref 0.00–0.07)
Basophils Absolute: 0 10*3/uL (ref 0.0–0.1)
Basophils Relative: 0 %
Eosinophils Absolute: 0 10*3/uL (ref 0.0–0.5)
Eosinophils Relative: 0 %
HCT: 18.6 % — ABNORMAL LOW (ref 36.0–46.0)
Hemoglobin: 6.1 g/dL — CL (ref 12.0–15.0)
Immature Granulocytes: 7 %
Lymphocytes Relative: 2 %
Lymphs Abs: 0.3 10*3/uL — ABNORMAL LOW (ref 0.7–4.0)
MCH: 29 pg (ref 26.0–34.0)
MCHC: 32.8 g/dL (ref 30.0–36.0)
MCV: 88.6 fL (ref 80.0–100.0)
Monocytes Absolute: 0.8 10*3/uL (ref 0.1–1.0)
Monocytes Relative: 6 %
Neutro Abs: 12.7 10*3/uL — ABNORMAL HIGH (ref 1.7–7.7)
Neutrophils Relative %: 85 %
Platelets: 268 10*3/uL (ref 150–400)
RBC: 2.1 MIL/uL — ABNORMAL LOW (ref 3.87–5.11)
RDW: 17.5 % — ABNORMAL HIGH (ref 11.5–15.5)
WBC: 15 10*3/uL — ABNORMAL HIGH (ref 4.0–10.5)
nRBC: 0.3 % — ABNORMAL HIGH (ref 0.0–0.2)

## 2021-07-12 LAB — COMPREHENSIVE METABOLIC PANEL
ALT: 27 U/L (ref 0–44)
AST: 17 U/L (ref 15–41)
Albumin: 3.7 g/dL (ref 3.5–5.0)
Alkaline Phosphatase: 100 U/L (ref 38–126)
Anion gap: 9 (ref 5–15)
BUN: 26 mg/dL — ABNORMAL HIGH (ref 6–20)
CO2: 25 mmol/L (ref 22–32)
Calcium: 8.9 mg/dL (ref 8.9–10.3)
Chloride: 108 mmol/L (ref 98–111)
Creatinine, Ser: 1.45 mg/dL — ABNORMAL HIGH (ref 0.44–1.00)
GFR, Estimated: 46 mL/min — ABNORMAL LOW (ref >60)
Glucose, Bld: 136 mg/dL — ABNORMAL HIGH (ref 70–99)
Potassium: 3.8 mmol/L (ref 3.5–5.1)
Sodium: 142 mmol/L (ref 135–145)
Total Bilirubin: 0.7 mg/dL (ref 0.3–1.2)
Total Protein: 6.9 g/dL (ref 6.5–8.1)

## 2021-07-12 LAB — PREPARE RBC (CROSSMATCH)

## 2021-07-12 MED ORDER — APIXABAN 5 MG PO TABS
5.0000 mg | ORAL_TABLET | Freq: Once | ORAL | Status: AC
  Administered 2021-07-12: 5 mg via ORAL
  Filled 2021-07-12: qty 1

## 2021-07-12 MED ORDER — SODIUM CHLORIDE 0.9 % IV SOLN: 10.0000 mL/h | Freq: Once | INTRAVENOUS | Status: DC

## 2021-07-12 NOTE — ED Triage Notes (Signed)
Patient states she is anemic and was seen on Friday for same but was not transfused. States today continued to feel lightheaded and dizzy.  ?

## 2021-07-12 NOTE — ED Provider Notes (Signed)
Care assumed from Dr. Florina Ou at 7:30 AM.  This patient has a history of chronic anemia.  He has recently had a decline in her hemoglobin.  Today it is 6.1.  She will require transfusion.  In the past, she has had transfusion reactions and requires special preparation of PRBC.  This has been ordered.  Following her transfusion, she will be appropriate for discharge. ?Physical Exam  ?BP 133/88   Pulse (!) 101   Temp 98.1 ?F (36.7 ?C) (Oral)   Resp 16   SpO2 98%  ? ?Physical Exam ?Vitals and nursing note reviewed.  ?Constitutional:   ?   General: She is not in acute distress. ?   Appearance: Normal appearance. She is well-developed and normal weight. She is not ill-appearing, toxic-appearing or diaphoretic.  ?HENT:  ?   Head: Normocephalic and atraumatic.  ?   Right Ear: External ear normal.  ?   Left Ear: External ear normal.  ?   Nose: Nose normal.  ?   Mouth/Throat:  ?   Mouth: Mucous membranes are moist.  ?   Pharynx: Oropharynx is clear.  ?Eyes:  ?   Extraocular Movements: Extraocular movements intact.  ?   Conjunctiva/sclera: Conjunctivae normal.  ?Cardiovascular:  ?   Rate and Rhythm: Normal rate and regular rhythm.  ?   Heart sounds: No murmur heard. ?Pulmonary:  ?   Effort: Pulmonary effort is normal. No respiratory distress.  ?Abdominal:  ?   General: There is no distension.  ?   Palpations: Abdomen is soft.  ?Musculoskeletal:     ?   General: No swelling or deformity. Normal range of motion.  ?   Cervical back: Normal range of motion and neck supple.  ?Skin: ?   General: Skin is warm and dry.  ?   Capillary Refill: Capillary refill takes less than 2 seconds.  ?   Coloration: Skin is not jaundiced or pale.  ?Neurological:  ?   General: No focal deficit present.  ?   Mental Status: She is alert and oriented to person, place, and time.  ?   Cranial Nerves: No cranial nerve deficit.  ?   Sensory: No sensory deficit.  ?   Motor: No weakness.  ?   Coordination: Coordination normal.  ?Psychiatric:     ?   Mood  and Affect: Mood normal.     ?   Behavior: Behavior normal.     ?   Thought Content: Thought content normal.     ?   Judgment: Judgment normal.  ? ? ?Procedures  ?Procedures ? ?ED Course / MDM  ?  ?Medical Decision Making ?Amount and/or Complexity of Data Reviewed ?Labs: ordered. ? ?Risk ?Prescription drug management. ? ? ?Patient remained in the ED, hemodynamically stable, asymptomatic at rest, awaiting blood transfusion.  At time of signout, infusion was initiated.  Care of patient was signed out to oncoming ED provider. ? ? ? ? ?  ?Godfrey Pick, MD ?07/12/21 1555 ? ?

## 2021-07-12 NOTE — Discharge Instructions (Addendum)
You have anemia.  ? ?See Dr. Alen Blew in a week to repeat CBC. You also may need infusion to increase your blood count  ? ?Return to ER if you have shortness of breath, weakness.  ?

## 2021-07-12 NOTE — ED Provider Notes (Signed)
?  Physical Exam  ?BP 118/69   Pulse (!) 104   Temp 98.3 ?F (36.8 ?C) (Oral)   Resp 18   SpO2 97%  ? ?Physical Exam ? ?Procedures  ?Procedures ? ?ED Course / MDM  ?  ?Medical Decision Making ?Care assumed at 3 pm. 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 Patient's Hg is 6.1. Had previous transfusion reaction so will need to get special type and cross blood. Sign out pending transfusion and discharge  ? ?8:36 PM ?Received 2 U PRBC with no reactions. Stable for discharge. Told her to follow up with Dr. Alen Blew outpatient.  ? ?Problems Addressed: ?Symptomatic anemia: acute illness or injury ? ?Amount and/or Complexity of Data Reviewed ?External Data Reviewed: labs and notes. ?Labs: ordered. Decision-making details documented in ED Course. ? ?Risk ?Prescription drug management. ? ? ? ? ? ? ? ?  ?Drenda Freeze, MD ?07/12/21 2037 ? ?

## 2021-07-12 NOTE — ED Provider Notes (Signed)
? ?[facility] DEPT ?Provider Note: Georgena Spurling, MD, [place] ? ?CSN: [id number] ?MRN: [id number] ?ARRIVAL: 07/12/21 at 0114 ?ROOM: WA18/WA18 ? ? ?CHIEF COMPLAINT  ?Anemia ? ? ?HISTORY OF PRESENT ILLNESS  ?07/12/21 2:44 AM ?Dawn Peters is a 41 y.o. female with a history of anemia and frequent blood transfusions who is followed by Dr. Alen Blew at the cancer center.  She was seen here by myself 2 days ago after she was told that her hemoglobin was 6.  Her CBC showed that her hemoglobin was in fact 6.6 and the patient elected to leave at that time.  She has a history of requiring specialized blood that must be obtained from [place] and she stated she would follow-up at the [facility] long oncology clinic today.  She returns stating she continues to feel very fatigued, especially with exertion, and occasionally dizzy (lightheaded).  She has noticed some edema in her lower legs which is not normal for her.  She is having back pain which she rates as a 7 out of 10 but this is chronic and not acutely changed. ? ? ?Past Medical History:  ?Diagnosis Date  ? Anticoagulant long-term use   ? Eliquis  ? Anxiety   ? Chronic pain   ? CKD (chronic kidney disease), stage III ([place])   ? Depression   ? 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 left lower extremity-- treated with eliquis  ? Gait disturbance   ? due to MS  ? GERD (gastroesophageal reflux disease)   ? watch diet  ? History of avascular necrosis of capital femoral epiphysis   ? bilateral due to MS treatment's  s/p  THA  ? History of DVT of lower extremity 2007  ? History of encephalopathy 45/8099  ? acute metabolic encephalopathy secondary to MS,  resolved  ? History of MRSA infection 2004  ? right hip infection post THA  ? History of pulmonary embolus (PE) 2005  ? Hydronephrosis, left   ? w/ acute kidney injury 02/ 2019  ? Hypertension   ? Left-sided weakness   ? due to MS  ? Migraines   ? MS (multiple sclerosis) [facility]) neurologist-  dr Renne Musca  ? dx 2000--  ?  Muscle spasticity   ? Neurogenic bladder   ? due to MS  ? Pulmonary embolism, bilateral ([place]) 09/17/2017  ? treated w/ eliquis  ? Strains to urinate   ? Urgency of urination   ? ? ?Past Surgical History:  ?Procedure Laterality Date  ? CYSTOSCOPY WITH RETROGRADE PYELOGRAM, URETEROSCOPY AND STENT PLACEMENT Bilateral 11/29/2017  ? Procedure: BILATERAL  RETROGRADE PYELOGRAM, LEFT DIAGNOSIC URETEROSCOPY AND STENT LEFT PLACEMENT;  Surgeon: Ardis Hughs, MD;  Location: [facility];  Service: Urology;  Laterality: Bilateral;  ? HIP ARTHROSCOPY Left 07-05-2013   dr pill '@[facility]'$   ? iliopsosas release, synovectomy  ? REVISION TOTAL HIP ARTHROPLASTY Right 03-28-2003    dr Alvan Dame '@[facility]'$   ? TOTAL HIP ARTHROPLASTY Bilateral left 11-05-2002  dr Alvan Dame '@[facility]'$ ;   right 06/ 2004  '@[facility]   ? ? ?Family History  ?Problem Relation Age of Onset  ? Healthy Mother   ? Healthy Father   ? Colon cancer Paternal Grandmother   ? Breast cancer Paternal Grandmother 83  ? Hypertension Other   ? Diabetes Other   ? ? ?Social History  ? ?Tobacco Use  ? Smoking status: Never  ? Smokeless tobacco: Never  ?Vaping Use  ? Vaping Use: Never used  ?Substance Use Topics  ?  Alcohol use: No  ? Drug use: No  ? ? ?Prior to Admission medications   ?Medication Sig Start Date End Date Taking? Authorizing Provider  ?acetaminophen (TYLENOL) 500 MG tablet Take 1,000 mg by mouth every 6 (six) hours as needed for moderate pain or headache.    [provider]  ?amantadine (SYMMETREL) 100 MG capsule Take 100 mg by mouth 2 (two) times daily. 09/27/20   [provider]  ?amitriptyline (ELAVIL) 25 MG tablet TAKE 1 TO 2 TABLETS(25 TO 50 MG) BY MOUTH AT BEDTIME 05/10/21   Sater, Nanine Means, MD  ?amLODipine (NORVASC) 5 MG tablet Take 1 tablet (5 mg total) by mouth daily. ?Patient taking differently: Take 5 mg by mouth daily after breakfast. 05/21/17   Arrien, Jimmy Picket, MD  ?amphetamine-dextroamphetamine (ADDERALL XR) 20 MG 24 hr capsule Take 1  capsule (20 mg total) by mouth daily. 04/29/21   Star Age, MD  ?apixaban (ELIQUIS) 5 MG TABS tablet Take 1 tablet (5 mg total) by mouth 2 (two) times daily. 06/22/21   Sater, Nanine Means, MD  ?atorvastatin (LIPITOR) 10 MG tablet Take 10 mg by mouth daily. 12/24/20   [provider]  ?baclofen (LIORESAL) 10 MG tablet Take 1 tablet (10 mg total) by mouth 4 (four) times daily. 03/12/21   Sater, Nanine Means, MD  ?busPIRone (BUSPAR) 15 MG tablet Take 1 tablet (15 mg total) by mouth 2 (two) times daily. 04/21/21   Sater, Nanine Means, MD  ?citalopram (CELEXA) 20 MG tablet Take 1 tablet (20 mg total) by mouth daily. 02/19/19   Sater, Nanine Means, MD  ?dalfampridine 10 MG TB12 Take 1 tablet (10 mg total) by mouth 2 (two) times daily. 09/21/20   Sater, Nanine Means, MD  ?folic acid (FOLVITE) 1 MG tablet Take 1 tablet (1 mg total) by mouth daily. 04/19/21   Regalado, Belkys A, MD  ?gabapentin (NEURONTIN) 800 MG tablet Take 1 tablet (800 mg total) by mouth 4 (four) times daily. 09/21/20   Sater, Nanine Means, MD  ?JARDIANCE 10 MG TABS tablet Take 10 mg by mouth daily. 03/14/21   [provider]  ?Rolan Lipa 72 MCG capsule Take 72 mcg by mouth every morning. 02/18/21   [provider]  ?metoprolol succinate (TOPROL-XL) 50 MG 24 hr tablet Take 50 mg by mouth daily. 09/27/20   [provider]  ?Multiple Vitamin (MULTIVITAMIN WITH MINERALS) TABS tablet Take 1 tablet by mouth daily. 09/20/17   Kayleen Memos, DO  ?norethindrone (MICRONOR) 0.35 MG tablet Take 1 tablet (0.35 mg total) by mouth daily. 11/19/18   Clarnce Flock, MD  ?omeprazole (PRILOSEC) 40 MG capsule Take 40 mg by mouth daily. 09/27/20   [provider]  ?Ozanimod HCl (ZEPOSIA) 0.92 MG CAPS Take 1 capsule (0.92 mg total) by mouth daily. 04/05/21   Sater, Nanine Means, MD  ?predniSONE (DELTASONE) 50 MG tablet 12 pills (600 mg) po daily x 3 days for MS exacerbation 07/01/21   Sater, Nanine Means, MD  ?SUMAtriptan (IMITREX) 100 MG tablet TAKE 1 TABLET BY  MOUTH 1 TIME FOR UP TO 1 DOSE AS NEEDED FOR MIGRAINE. MAY REPEAT IN 2 HOURS IF HEADACHE PERSISTS OR RECURS ?Patient taking differently: Take 100 mg by mouth daily as needed for migraine or headache. 09/21/20   Sater, Nanine Means, MD  ?tamsulosin (FLOMAX) 0.4 MG CAPS capsule TAKE 1 CAPSULE(0.4 MG) BY MOUTH DAILY ?Patient taking differently: Take 0.4 mg by mouth daily. 08/05/20   Sater, Nanine Means, MD  ?tiZANidine (ZANAFLEX)  4 MG tablet TAKE 1 TABLET BY MOUTH EVERY NIGHT AT BEDTIME ?Patient taking differently: Take 4 mg by mouth at bedtime. 08/05/20   Sater, Nanine Means, MD  ?vitamin B-12 (CYANOCOBALAMIN) 1000 MCG tablet Take 1 tablet (1,000 mcg total) by mouth daily. 04/20/21   Regalado, Belkys A, MD  ?Oxcarbazepine (TRILEPTAL) 300 MG tablet Take 1 tablet (300 mg total) by mouth 2 (two) times daily. ?Patient not taking: Reported on 07/19/2019 11/11/15 07/19/19  Britt Bottom, MD  ? ? ?Allergies ?Ace inhibitors, Amoxicillin, and Lisinopril ? ? ?REVIEW OF SYSTEMS  ?Negative except as noted here or in the History of Present Illness. ? ? ?PHYSICAL EXAMINATION  ?Initial Vital Signs ?Blood pressure 135/76, pulse (!) 107, temperature 98.1 ?F (36.7 ?C), temperature source Oral, resp. rate 16, SpO2 97 %. ? ?Examination ?General: Well-developed, well-nourished female in no acute distress; appearance consistent with age of record ?HENT: normocephalic; atraumatic ?Eyes: Pale conjunctivae ?Neck: supple ?Heart: regular rate and rhythm ?Lungs: clear to auscultation bilaterally ?Abdomen: soft; nondistended; nontender; bowel sounds present ?Extremities: No deformity; full range of motion; trace edema of lower legs ?Neurologic: Awake, alert and oriented; motor function intact in all extremities and symmetric; no facial droop ?Skin: Warm and dry ?Psychiatric: Normal mood and affect ? ? ?RESULTS  ?Summary of this visit's results, reviewed and interpreted by myself: ? ? EKG Interpretation ? ?Date/Time:    ?Ventricular Rate:    ?PR Interval:    ?QRS  Duration:   ?QT Interval:    ?QTC Calculation:   ?R Axis:     ?Text Interpretation:   ?  ? ?  ? ?Laboratory Studies: ?Results for orders placed or performed during the hospital encounter of 07/12/21 (from t

## 2021-07-13 LAB — TYPE AND SCREEN
ABO/RH(D): B POS
Antibody Screen: POSITIVE
DAT, IgG: NEGATIVE
Unit division: 0
Unit division: 0

## 2021-07-13 LAB — BPAM RBC
Blood Product Expiration Date: 202306132359
Blood Product Expiration Date: 202306152359
ISSUE DATE / TIME: 202305081503
ISSUE DATE / TIME: 202305081811
Unit Type and Rh: 7300
Unit Type and Rh: 7300

## 2021-07-14 ENCOUNTER — Other Ambulatory Visit: Payer: Self-pay | Admitting: *Deleted

## 2021-07-14 MED ORDER — AMPHETAMINE-DEXTROAMPHET ER 20 MG PO CP24: 20.0000 mg | ORAL_CAPSULE | Freq: Every day | ORAL | 0 refills | Status: DC

## 2021-07-14 NOTE — Progress Notes (Signed)
Meds ordered this encounter  ?Medications  ? amphetamine-dextroamphetamine (ADDERALL XR) 20 MG 24 hr capsule  ?  Sig: Take 1 capsule (20 mg total) by mouth daily.  ?  Dispense:  30 capsule  ?  Refill:  0  ?  Dr. Felecia Shelling out, Dr. Leta Baptist covering MD  ? ? ?Penni Bombard, MD 9/64/3838, 18:40 PM ?Certified in Neurology, Neurophysiology and Neuroimaging ? ?[facility] ?[address], [address] ?[place], [place] [address] ?[phone] ? ?

## 2021-07-16 ENCOUNTER — Inpatient Hospital Stay: Payer: Medicare HMO

## 2021-07-16 ENCOUNTER — Inpatient Hospital Stay: Payer: Medicare HMO | Attending: Physician Assistant

## 2021-07-16 DIAGNOSIS — Z79899 Other long term (current) drug therapy: Secondary | ICD-10-CM | POA: Insufficient documentation

## 2021-07-16 DIAGNOSIS — N1831 Chronic kidney disease, stage 3a: Secondary | ICD-10-CM | POA: Insufficient documentation

## 2021-07-16 DIAGNOSIS — D619 Aplastic anemia, unspecified: Secondary | ICD-10-CM | POA: Insufficient documentation

## 2021-07-16 DIAGNOSIS — D631 Anemia in chronic kidney disease: Secondary | ICD-10-CM | POA: Insufficient documentation

## 2021-07-16 DIAGNOSIS — G35 Multiple sclerosis: Secondary | ICD-10-CM | POA: Insufficient documentation

## 2021-07-21 ENCOUNTER — Ambulatory Visit: Payer: Medicare Other | Admitting: Neurology

## 2021-07-23 ENCOUNTER — Inpatient Hospital Stay: Payer: Medicare HMO

## 2021-07-23 ENCOUNTER — Other Ambulatory Visit: Payer: Self-pay

## 2021-07-23 VITALS — BP 132/76 | HR 104 | Temp 98.1°F | Resp 18

## 2021-07-23 DIAGNOSIS — D649 Anemia, unspecified: Secondary | ICD-10-CM

## 2021-07-23 DIAGNOSIS — D631 Anemia in chronic kidney disease: Secondary | ICD-10-CM | POA: Diagnosis present

## 2021-07-23 DIAGNOSIS — N1831 Chronic kidney disease, stage 3a: Secondary | ICD-10-CM

## 2021-07-23 DIAGNOSIS — Z79899 Other long term (current) drug therapy: Secondary | ICD-10-CM | POA: Diagnosis not present

## 2021-07-23 DIAGNOSIS — D619 Aplastic anemia, unspecified: Secondary | ICD-10-CM | POA: Diagnosis not present

## 2021-07-23 DIAGNOSIS — G35 Multiple sclerosis: Secondary | ICD-10-CM | POA: Diagnosis not present

## 2021-07-23 LAB — CBC WITH DIFFERENTIAL (CANCER CENTER ONLY)
Abs Immature Granulocytes: 0.77 10*3/uL — ABNORMAL HIGH (ref 0.00–0.07)
Basophils Absolute: 0.1 10*3/uL (ref 0.0–0.1)
Basophils Relative: 1 %
Eosinophils Absolute: 0 10*3/uL (ref 0.0–0.5)
Eosinophils Relative: 0 %
HCT: 26.2 % — ABNORMAL LOW (ref 36.0–46.0)
Hemoglobin: 8.5 g/dL — ABNORMAL LOW (ref 12.0–15.0)
Immature Granulocytes: 8 %
Lymphocytes Relative: 3 %
Lymphs Abs: 0.3 10*3/uL — ABNORMAL LOW (ref 0.7–4.0)
MCH: 29.2 pg (ref 26.0–34.0)
MCHC: 32.4 g/dL (ref 30.0–36.0)
MCV: 90 fL (ref 80.0–100.0)
Monocytes Absolute: 0.7 10*3/uL (ref 0.1–1.0)
Monocytes Relative: 7 %
Neutro Abs: 8 10*3/uL — ABNORMAL HIGH (ref 1.7–7.7)
Neutrophils Relative %: 81 %
Platelet Count: 284 10*3/uL (ref 150–400)
RBC: 2.91 MIL/uL — ABNORMAL LOW (ref 3.87–5.11)
RDW: 18.5 % — ABNORMAL HIGH (ref 11.5–15.5)
Smear Review: NORMAL
WBC Count: 9.8 10*3/uL (ref 4.0–10.5)
nRBC: 1.8 % — ABNORMAL HIGH (ref 0.0–0.2)

## 2021-07-23 LAB — SAMPLE TO BLOOD BANK

## 2021-07-23 MED ORDER — EPOETIN ALFA-EPBX 20000 UNIT/ML IJ SOLN
20000.0000 [IU] | Freq: Once | INTRAMUSCULAR | Status: AC
  Administered 2021-07-23: 20000 [IU] via SUBCUTANEOUS
  Filled 2021-07-23: qty 1

## 2021-07-27 NOTE — Telephone Encounter (Signed)
Called [facility] at [phone]. Was on hold for 14 min to try and speak with someone. While on hold I submitted PA on CMM. [employer] - PA Case ID: [id number].  Received instant approval: "PA Case: [id number], Status: Approved, Coverage Starts on: 03/07/2021 12:00:00 AM, Coverage Ends on: 03/06/2022 12:00:00 AM. Questions? Contact [phone]."

## 2021-07-28 ENCOUNTER — Other Ambulatory Visit: Payer: Self-pay | Admitting: *Deleted

## 2021-07-28 ENCOUNTER — Other Ambulatory Visit: Payer: Self-pay | Admitting: Neurology

## 2021-07-28 MED ORDER — TIZANIDINE HCL 4 MG PO TABS: 4.0000 mg | ORAL_TABLET | Freq: Every day | ORAL | 10 refills | Status: DC

## 2021-07-28 MED ORDER — GABAPENTIN 800 MG PO TABS: 800.0000 mg | ORAL_TABLET | Freq: Four times a day (QID) | ORAL | 0 refills | Status: DC

## 2021-07-28 MED ORDER — APIXABAN 5 MG PO TABS: 5.0000 mg | ORAL_TABLET | Freq: Two times a day (BID) | ORAL | 5 refills | Status: DC

## 2021-07-28 MED ORDER — DALFAMPRIDINE ER 10 MG PO TB12: 10.0000 mg | ORAL_TABLET | Freq: Two times a day (BID) | ORAL | 11 refills | Status: DC

## 2021-07-28 MED ORDER — BUSPIRONE HCL 15 MG PO TABS: 15.0000 mg | ORAL_TABLET | Freq: Two times a day (BID) | ORAL | 0 refills | Status: DC

## 2021-07-28 MED ORDER — AMITRIPTYLINE HCL 25 MG PO TABS: 25.0000 mg | ORAL_TABLET | Freq: Every day | ORAL | 0 refills | Status: DC

## 2021-07-28 MED ORDER — SUMATRIPTAN SUCCINATE 100 MG PO TABS: ORAL_TABLET | ORAL | 5 refills | Status: DC

## 2021-07-29 ENCOUNTER — Telehealth: Payer: Self-pay | Admitting: *Deleted

## 2021-07-29 ENCOUNTER — Inpatient Hospital Stay: Payer: Medicare HMO

## 2021-07-29 ENCOUNTER — Other Ambulatory Visit: Payer: Self-pay | Admitting: *Deleted

## 2021-07-29 ENCOUNTER — Inpatient Hospital Stay: Payer: Medicare HMO | Admitting: Oncology

## 2021-07-29 ENCOUNTER — Inpatient Hospital Stay (HOSPITAL_BASED_OUTPATIENT_CLINIC_OR_DEPARTMENT_OTHER): Payer: Medicare HMO | Admitting: Oncology

## 2021-07-29 VITALS — BP 117/76 | HR 89 | Temp 97.9°F | Resp 17 | Ht 66.0 in | Wt 220.5 lb

## 2021-07-29 DIAGNOSIS — N1831 Chronic kidney disease, stage 3a: Secondary | ICD-10-CM

## 2021-07-29 DIAGNOSIS — D649 Anemia, unspecified: Secondary | ICD-10-CM | POA: Diagnosis not present

## 2021-07-29 LAB — CBC
HCT: 24 % — ABNORMAL LOW (ref 36.0–46.0)
Hemoglobin: 7.8 g/dL — ABNORMAL LOW (ref 12.0–15.0)
MCH: 29.8 pg (ref 26.0–34.0)
MCHC: 32.5 g/dL (ref 30.0–36.0)
MCV: 91.6 fL (ref 80.0–100.0)
Platelets: 295 10*3/uL (ref 150–400)
RBC: 2.62 MIL/uL — ABNORMAL LOW (ref 3.87–5.11)
RDW: 20.9 % — ABNORMAL HIGH (ref 11.5–15.5)
WBC: 5.3 10*3/uL (ref 4.0–10.5)
nRBC: 1.1 % — ABNORMAL HIGH (ref 0.0–0.2)

## 2021-07-29 LAB — SAMPLE TO BLOOD BANK

## 2021-07-29 LAB — PREPARE RBC (CROSSMATCH)

## 2021-07-29 NOTE — Telephone Encounter (Signed)
Patient referral to Dr. Florene Glen with [facility] per Dr. Alen Blew - patient's demographic information, insurance card, office note & labs from 07/29/21 faxed, fax confirmation received.

## 2021-07-29 NOTE — Telephone Encounter (Signed)
PC to patient, no answer, left VM - informed patient she has appointment at 8:00 tomorrow, 07/30/21 for one unit of blood.

## 2021-07-29 NOTE — Progress Notes (Signed)
Hematology and Oncology Follow Up Visit  Dawn Peters [id number] [birth year] 41 y.o. 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  She has unclear etiology despite work-up.  Prior work-up: Bone marrow biopsy obtained on 11/23/2020 showed hypercellular marrow with trilineage hematopoiesis and fibrosis.   NGS for JAK2, MPL and CALR showed no detectable genetic alteration on December 16, 2020.   Current therapy:  Retacrit 20,000 units started on December 11, 2020.  This is to be given every 2 weeks.  Repeat packed red cell transfusion as needed.  Interim History: Dawn Peters returns today for repeat evaluation.  Since the last visit, she continues to be erratic in her follow-up as well as adherence to Retacrit.  She still requires packed red cell transfusion periodically.  Clinically, she reports feeling reasonably well without any major complaints.  She did have a exacerbation of her MS that required steroid dosing with improvement at this time.  She denies any bleeding or thrombosis episodes.  She is able to ambulate with assistance and able to drive.     Medications: Updated on review. Current Outpatient Medications  Medication Sig Dispense Refill   acetaminophen (TYLENOL) 500 MG tablet Take 1,000 mg by mouth every 6 (six) hours as needed for moderate pain or headache.     amantadine (SYMMETREL) 100 MG capsule Take 100 mg by mouth 2 (two) times daily.     amitriptyline (ELAVIL) 25 MG tablet Take 1-2 tablets (25-50 mg total) by mouth at bedtime. 180 tablet 0   amLODipine (NORVASC) 5 MG tablet Take 1 tablet (5 mg total) by mouth daily. (Patient taking differently: Take 5 mg by mouth daily after breakfast.) 90 tablet 0   amphetamine-dextroamphetamine (ADDERALL XR) 20 MG 24 hr capsule TAKE ONE CAPSULE DAILY AT 9AM (VIAL) 30 capsule 0   apixaban (ELIQUIS) 5 MG TABS tablet Take 1  tablet (5 mg total) by mouth 2 (two) times daily. 60 tablet 5   atorvastatin (LIPITOR) 10 MG tablet Take 10 mg by mouth daily.     baclofen (LIORESAL) 10 MG tablet Take 1 tablet (10 mg total) by mouth 4 (four) times daily. 360 tablet 1   busPIRone (BUSPAR) 15 MG tablet Take 1 tablet (15 mg total) by mouth 2 (two) times daily. 180 tablet 0   citalopram (CELEXA) 20 MG tablet Take 1 tablet (20 mg total) by mouth daily. 30 tablet 11   dalfampridine 10 MG TB12 Take 1 tablet (10 mg total) by mouth 2 (two) times daily. 60 tablet 11   diphenhydrAMINE (BENADRYL) 25 mg capsule Take 25 mg by mouth every 6 (six) hours as needed for itching.     folic acid (FOLVITE) 1 MG tablet Take 1 tablet (1 mg total) by mouth daily. 30 tablet 1   gabapentin (NEURONTIN) 800 MG tablet Take 1 tablet (800 mg total) by mouth 4 (four) times daily. 360 tablet 0   JARDIANCE 10 MG TABS tablet Take 10 mg by mouth daily.     LINZESS 72 MCG capsule Take 72 mcg by mouth daily as needed (constipation).     metoprolol succinate (TOPROL-XL) 50 MG 24 hr tablet Take 50 mg by mouth daily.     Multiple Vitamin (MULTIVITAMIN WITH MINERALS) TABS tablet Take 1 tablet by mouth daily. 30 tablet 0   norethindrone (MICRONOR) 0.35 MG tablet Take 1 tablet (0.35 mg total) by mouth daily. 1 Package 11   omeprazole (PRILOSEC)  40 MG capsule Take 40 mg by mouth daily.     Ozanimod HCl (ZEPOSIA) 0.92 MG CAPS Take 1 capsule (0.92 mg total) by mouth daily. (Patient taking differently: Take 1 capsule by mouth once a week. Wednesday) 30 capsule 5   predniSONE (DELTASONE) 50 MG tablet 12 pills (600 mg) po daily x 3 days for MS exacerbation 36 tablet 0   SUMAtriptan (IMITREX) 100 MG tablet TAKE 1 TABLET BY MOUTH 1 TIME FOR UP TO 1 DOSE AS NEEDED FOR MIGRAINE. MAY REPEAT IN 2 HOURS IF HEADACHE PERSISTS OR RECURS 10 tablet 5   tamsulosin (FLOMAX) 0.4 MG CAPS capsule TAKE 1 CAPSULE(0.4 MG) BY MOUTH DAILY (Patient taking differently: Take 0.4 mg by mouth daily.) 30  capsule 11   tiZANidine (ZANAFLEX) 4 MG tablet Take 1 tablet (4 mg total) by mouth at bedtime. 30 tablet 10   vitamin B-12 (CYANOCOBALAMIN) 1000 MCG tablet Take 1 tablet (1,000 mcg total) by mouth daily. 20 tablet 0   No current facility-administered medications for this visit.   Facility-Administered Medications Ordered in Other Visits  Medication Dose Route Frequency Provider Last Rate Last Admin   0.9 %  sodium chloride infusion (Manually program via Guardrails IV Fluids)  250 mL Intravenous Once Wyatt Portela, MD       acetaminophen (TYLENOL) 325 MG tablet            diphenhydrAMINE (BENADRYL) 25 mg capsule              Allergies:  Allergies  Allergen Reactions   Ace Inhibitors Swelling   Amoxicillin Itching    Did it involve swelling of the face/tongue/throat, SOB, or low BP? Yes Did it involve sudden or severe rash/hives, skin peeling, or any reaction on the inside of your mouth or nose? No Did you need to seek medical attention at a hospital or doctor's office? No When did it last happen?      unknown  If all above answers are "NO", may proceed with cephalosporin use.;   Lisinopril Swelling    Lower lip swelling      Physical Exam:  Blood pressure 117/76, pulse 89, temperature 97.9 F (36.6 C), temperature source Temporal, resp. rate 17, height $RemoveBe'5\' 6"'yexoLTUWS$  (1.676 m), weight 220 lb 8 oz (100 kg), SpO2 100 %.   ECOG:  1    General appearance: Alert, awake without any distress. Head: Atraumatic without abnormalities Oropharynx: Without any thrush or ulcers. Eyes: No scleral icterus. Lymph nodes: No lymphadenopathy noted in the cervical, supraclavicular, or axillary nodes Heart:regular rate and rhythm, without any murmurs or gallops.   Lung: Clear to auscultation without any rhonchi, wheezes or dullness to percussion. Abdomin: Soft, nontender without any shifting dullness or ascites. Musculoskeletal: No clubbing or cyanosis. Neurological: No motor or sensory  deficits. Skin: No rashes or lesions.      Lab Results: Lab Results  Component Value Date   WBC 9.8 07/23/2021   HGB 8.5 (L) 07/23/2021   HCT 26.2 (L) 07/23/2021   MCV 90.0 07/23/2021   PLT 284 07/23/2021     Chemistry      Component Value Date/Time   NA 142 07/12/2021 0159   NA 144 06/21/2017 1626   K 3.8 07/12/2021 0159   CL 108 07/12/2021 0159   CO2 25 07/12/2021 0159   BUN 26 (H) 07/12/2021 0159   BUN 16 06/21/2017 1626   CREATININE 1.45 (H) 07/12/2021 0159   CREATININE 1.20 (H) 06/07/2021 1232   CREATININE 0.85  07/03/2014 0959      Component Value Date/Time   CALCIUM 8.9 07/12/2021 0159   ALKPHOS 100 07/12/2021 0159   AST 17 07/12/2021 0159   AST 12 (L) 06/07/2021 1232   ALT 27 07/12/2021 0159   ALT 17 06/07/2021 1232   BILITOT 0.7 07/12/2021 0159   BILITOT 0.4 06/07/2021 1232          Impression and Plan:  41 year old woman with:  1.  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    She is currently on Retacrit as well as packed red cell transfusion which she has been receiving intermittently.  She has been erratic in her follow-up and adherence to Retacrit injections.  The differential diagnosis was reiterated again.  Myelofibrosis versus autoimmune etiologies are also a consideration although the bone marrow was not diagnostic for any of these.  I still favor obtaining a evaluation from [place] which was discussed with the patient again today.  She is agreeable with this referral at this time.  2.  Multiple sclerosis: Currently manageable without ongoing flareup.  3.  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  4.  Chronic renal sufficiency: Creatinine clearance is around 45 cc/min and fluctuating.  5.  Follow-up: She will continue to follow every 2 to 3 weeks for Retacrit and MD follow-up in the next 4 to 6 weeks to  repeat evaluation.  30  minutes were spent on this encounter.  The time was dedicated to reviewing laboratory data, disease status update and outlining future plan of care discussion.   Zola Button, MD 5/25/202312:04 PM

## 2021-08-02 LAB — BPAM RBC
Blood Product Expiration Date: 202306132359
Unit Type and Rh: 7300

## 2021-08-02 LAB — TYPE AND SCREEN
ABO/RH(D): B POS
Antibody Screen: POSITIVE
DAT, IgG: POSITIVE
Unit division: 0

## 2021-08-05 ENCOUNTER — Inpatient Hospital Stay: Payer: Medicare HMO | Attending: Physician Assistant

## 2021-08-05 ENCOUNTER — Other Ambulatory Visit: Payer: Self-pay

## 2021-08-05 DIAGNOSIS — D631 Anemia in chronic kidney disease: Secondary | ICD-10-CM | POA: Diagnosis present

## 2021-08-05 DIAGNOSIS — N1831 Chronic kidney disease, stage 3a: Secondary | ICD-10-CM | POA: Insufficient documentation

## 2021-08-05 DIAGNOSIS — D649 Anemia, unspecified: Secondary | ICD-10-CM

## 2021-08-05 LAB — CBC WITH DIFFERENTIAL (CANCER CENTER ONLY)
Abs Immature Granulocytes: 0.28 10*3/uL — ABNORMAL HIGH (ref 0.00–0.07)
Basophils Absolute: 0 10*3/uL (ref 0.0–0.1)
Basophils Relative: 0 %
Eosinophils Absolute: 0.1 10*3/uL (ref 0.0–0.5)
Eosinophils Relative: 1 %
HCT: 26.2 % — ABNORMAL LOW (ref 36.0–46.0)
Hemoglobin: 8.3 g/dL — ABNORMAL LOW (ref 12.0–15.0)
Immature Granulocytes: 5 %
Lymphocytes Relative: 3 %
Lymphs Abs: 0.2 10*3/uL — ABNORMAL LOW (ref 0.7–4.0)
MCH: 29.9 pg (ref 26.0–34.0)
MCHC: 31.7 g/dL (ref 30.0–36.0)
MCV: 94.2 fL (ref 80.0–100.0)
Monocytes Absolute: 0.8 10*3/uL (ref 0.1–1.0)
Monocytes Relative: 13 %
Neutro Abs: 4.5 10*3/uL (ref 1.7–7.7)
Neutrophils Relative %: 78 %
Platelet Count: 280 10*3/uL (ref 150–400)
RBC: 2.78 MIL/uL — ABNORMAL LOW (ref 3.87–5.11)
RDW: 24.4 % — ABNORMAL HIGH (ref 11.5–15.5)
WBC Count: 5.8 10*3/uL (ref 4.0–10.5)
nRBC: 1.9 % — ABNORMAL HIGH (ref 0.0–0.2)

## 2021-08-05 LAB — SAMPLE TO BLOOD BANK

## 2021-08-12 ENCOUNTER — Inpatient Hospital Stay: Payer: Medicare HMO

## 2021-08-13 ENCOUNTER — Telehealth: Payer: Self-pay | Admitting: Oncology

## 2021-08-13 NOTE — Telephone Encounter (Signed)
.  Called patient to schedule appointment per 6/8 inbasket, patient is aware of date and time.   

## 2021-08-16 ENCOUNTER — Inpatient Hospital Stay: Payer: Medicare HMO

## 2021-08-16 ENCOUNTER — Other Ambulatory Visit: Payer: Self-pay

## 2021-08-16 VITALS — BP 113/90 | HR 87 | Temp 97.6°F | Resp 18

## 2021-08-16 DIAGNOSIS — D649 Anemia, unspecified: Secondary | ICD-10-CM

## 2021-08-16 DIAGNOSIS — N1831 Chronic kidney disease, stage 3a: Secondary | ICD-10-CM | POA: Diagnosis not present

## 2021-08-16 LAB — CBC WITH DIFFERENTIAL (CANCER CENTER ONLY)
Abs Immature Granulocytes: 0.04 10*3/uL (ref 0.00–0.07)
Basophils Absolute: 0 10*3/uL (ref 0.0–0.1)
Basophils Relative: 0 %
Eosinophils Absolute: 0 10*3/uL (ref 0.0–0.5)
Eosinophils Relative: 1 %
HCT: 28.4 % — ABNORMAL LOW (ref 36.0–46.0)
Hemoglobin: 9.5 g/dL — ABNORMAL LOW (ref 12.0–15.0)
Immature Granulocytes: 1 %
Lymphocytes Relative: 12 %
Lymphs Abs: 0.4 10*3/uL — ABNORMAL LOW (ref 0.7–4.0)
MCH: 31.6 pg (ref 26.0–34.0)
MCHC: 33.5 g/dL (ref 30.0–36.0)
MCV: 94.4 fL (ref 80.0–100.0)
Monocytes Absolute: 0.5 10*3/uL (ref 0.1–1.0)
Monocytes Relative: 13 %
Neutro Abs: 2.6 10*3/uL (ref 1.7–7.7)
Neutrophils Relative %: 73 %
Platelet Count: 272 10*3/uL (ref 150–400)
RBC: 3.01 MIL/uL — ABNORMAL LOW (ref 3.87–5.11)
RDW: 24 % — ABNORMAL HIGH (ref 11.5–15.5)
WBC Count: 3.6 10*3/uL — ABNORMAL LOW (ref 4.0–10.5)
nRBC: 0 % (ref 0.0–0.2)

## 2021-08-16 LAB — SAMPLE TO BLOOD BANK

## 2021-08-16 MED ORDER — EPOETIN ALFA-EPBX 20000 UNIT/ML IJ SOLN
20000.0000 [IU] | Freq: Once | INTRAMUSCULAR | Status: AC
  Administered 2021-08-16: 20000 [IU] via SUBCUTANEOUS
  Filled 2021-08-16: qty 1

## 2021-08-21 ENCOUNTER — Other Ambulatory Visit: Payer: Self-pay | Admitting: Neurology

## 2021-08-25 ENCOUNTER — Other Ambulatory Visit: Payer: Self-pay | Admitting: Neurology

## 2021-08-26 ENCOUNTER — Other Ambulatory Visit: Payer: Self-pay

## 2021-08-26 ENCOUNTER — Inpatient Hospital Stay: Payer: Medicare HMO

## 2021-08-26 DIAGNOSIS — N1831 Chronic kidney disease, stage 3a: Secondary | ICD-10-CM

## 2021-08-26 DIAGNOSIS — D649 Anemia, unspecified: Secondary | ICD-10-CM

## 2021-08-26 LAB — CBC WITH DIFFERENTIAL (CANCER CENTER ONLY)
Abs Immature Granulocytes: 0.04 10*3/uL (ref 0.00–0.07)
Basophils Absolute: 0 10*3/uL (ref 0.0–0.1)
Basophils Relative: 0 %
Eosinophils Absolute: 0.1 10*3/uL (ref 0.0–0.5)
Eosinophils Relative: 2 %
HCT: 33 % — ABNORMAL LOW (ref 36.0–46.0)
Hemoglobin: 10.9 g/dL — ABNORMAL LOW (ref 12.0–15.0)
Immature Granulocytes: 1 %
Lymphocytes Relative: 7 %
Lymphs Abs: 0.3 10*3/uL — ABNORMAL LOW (ref 0.7–4.0)
MCH: 32.1 pg (ref 26.0–34.0)
MCHC: 33 g/dL (ref 30.0–36.0)
MCV: 97.1 fL (ref 80.0–100.0)
Monocytes Absolute: 0.5 10*3/uL (ref 0.1–1.0)
Monocytes Relative: 12 %
Neutro Abs: 3 10*3/uL (ref 1.7–7.7)
Neutrophils Relative %: 78 %
Platelet Count: 325 10*3/uL (ref 150–400)
RBC: 3.4 MIL/uL — ABNORMAL LOW (ref 3.87–5.11)
WBC Count: 3.8 10*3/uL — ABNORMAL LOW (ref 4.0–10.5)
nRBC: 0.5 % — ABNORMAL HIGH (ref 0.0–0.2)

## 2021-08-26 LAB — SAMPLE TO BLOOD BANK

## 2021-08-26 NOTE — Progress Notes (Signed)
Patient is not going to receive her injection per MD orders.

## 2021-08-30 ENCOUNTER — Encounter: Payer: Medicare HMO | Admitting: Physical Medicine and Rehabilitation

## 2021-09-02 ENCOUNTER — Encounter: Payer: Self-pay | Admitting: Physical Medicine & Rehabilitation

## 2021-09-02 ENCOUNTER — Encounter: Payer: Medicare HMO | Attending: Physical Medicine and Rehabilitation | Admitting: Physical Medicine & Rehabilitation

## 2021-09-02 VITALS — BP 114/73 | HR 81

## 2021-09-02 DIAGNOSIS — Z79891 Long term (current) use of opiate analgesic: Secondary | ICD-10-CM | POA: Insufficient documentation

## 2021-09-02 DIAGNOSIS — G8929 Other chronic pain: Secondary | ICD-10-CM | POA: Insufficient documentation

## 2021-09-02 DIAGNOSIS — R269 Unspecified abnormalities of gait and mobility: Secondary | ICD-10-CM | POA: Insufficient documentation

## 2021-09-02 DIAGNOSIS — M545 Low back pain, unspecified: Secondary | ICD-10-CM | POA: Diagnosis present

## 2021-09-02 DIAGNOSIS — G35 Multiple sclerosis: Secondary | ICD-10-CM | POA: Diagnosis present

## 2021-09-02 DIAGNOSIS — G894 Chronic pain syndrome: Secondary | ICD-10-CM | POA: Diagnosis present

## 2021-09-02 DIAGNOSIS — Z5181 Encounter for therapeutic drug level monitoring: Secondary | ICD-10-CM | POA: Diagnosis present

## 2021-09-02 NOTE — Progress Notes (Signed)
Subjective:    Patient ID: Dawn Peters, female    DOB: October 30, 1980, 41 y.o.   MRN: [id number]  HPI 41 year old female with past medical history of MS, depression, CKD, chronic lower back pain who is here for evaluation of her chronic pain.  She reports she has had back pain for at least 6 years.  Pain has been gradually worsening.  Her worst pain is in her lower back and she has milder pain in her neck.  She has been using a wheelchair for her mobility.  She can walk shorter distances with a cane or a walker.  Activities such as walking worsen her pain.  She has left lower extremity weakness that is chronic due to her MS.  She reports she has an AFO for her left lower extremity.  She takes gabapentin for neuropathic pain with benefit.  She also takes Elavil 25 mg which helps her mood and sleep.  She also takes Celexa for her mood.  She also takes Tylenol for her back pain that provides mild benefit.  In the past she has worked with physical therapy and found this to be helpful to her pain. She reports she was previously followed by [facility] over a year ago and was taking Percocet. Patient reports she was more active and less limited in her activities when she was using oxycodone for pain control. She reports she was discharged due to a positive cocaine result on UDS.  Patient says she did not knowingly take cocaine at the time and says she does not currently use the substance.  She cannot take NSAIDs due to her CKD.  In the past she also had Lyrica , it helped her neuropathic pain similar to gabapentin.  Tramadol provided a mild benefit.  She thinks she may have had hydrocodone in the past that also provided a mild to moderate benefit.     Pain Inventory Average Pain 8 Pain Right Now 8 My pain is sharp and stabbing  In the last 24 hours, has pain interfered with the following? General activity 6 Relation with others 7 Enjoyment of life 7 What TIME of day is your pain at its  worst? daytime and night Sleep (in general) NA  Pain is worse with: walking, bending, sitting, standing, and some activites Pain improves with: pacing activities, medication, and TENS Relief from Meds: 10  use a cane use a walker ability to climb steps?  no do you drive?  no use a wheelchair needs help with transfers  disabled: date disabled . I need assistance with the following:  household duties and shopping  weakness tremor trouble walking spasms  Any changes since last visit?  no  Neurologist Dr Felecia Shelling    Family History  Problem Relation Age of Onset   Healthy Mother    Healthy Father    Colon cancer Paternal Grandmother    Breast cancer Paternal Grandmother 50   Hypertension Other    Diabetes Other    Social History   Socioeconomic History   Marital status: Single    Spouse name: Not on file   Number of children: Not on file   Years of education: Not on file   Highest education level: Not on file  Occupational History   Not on file  Tobacco Use   Smoking status: Never   Smokeless tobacco: Never  Vaping Use   Vaping Use: Never used  Substance and Sexual Activity   Alcohol use: No   Drug  use: No   Sexual activity: Not Currently  Other Topics Concern   Not on file  Social History Narrative   Not on file   Social Determinants of Health   Financial Resource Strain: Not on file  Food Insecurity: Not on file  Transportation Needs: Not on file  Physical Activity: Not on file  Stress: Not on file  Social Connections: Not on file   Past Surgical History:  Procedure Laterality Date   CYSTOSCOPY WITH RETROGRADE PYELOGRAM, URETEROSCOPY AND STENT PLACEMENT Bilateral 11/29/2017   Procedure: [place], LEFT DIAGNOSIC URETEROSCOPY [place];  Surgeon: Ardis Hughs, MD;  Location: [facility];  Service: Urology;  Laterality: Bilateral;   HIP ARTHROSCOPY Left 07-05-2013   dr pill '@[facility]'$    iliopsosas  release, synovectomy   REVISION TOTAL HIP ARTHROPLASTY Right 03-28-2003    dr Alvan Dame '@[facility]'$    TOTAL HIP ARTHROPLASTY Bilateral left 11-05-2002  dr Alvan Dame '@[facility]'$ ;   right 06/ 2004  '@[facility]'$    Past Medical History:  Diagnosis Date   Anticoagulant long-term use    Eliquis   Anxiety    Chronic pain    CKD (chronic kidney disease), stage III ([place])    Depression    DVT, lower extremity, recurrent ([place]) 09/17/2017   left lower extremity-- treated with eliquis   Gait disturbance    due to MS   GERD (gastroesophageal reflux disease)    watch diet   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/p  THA   History of DVT of lower extremity 2007   History of encephalopathy 22/0254   acute metabolic encephalopathy secondary to MS,  resolved   History of MRSA infection 2004   right hip infection post THA   History of pulmonary embolus (PE) 2005   Hydronephrosis, left    w/ acute kidney injury 02/ 2019   Hypertension    Left-sided weakness    due to MS   Migraines    MS (multiple sclerosis) [facility]) neurologist-  dr Renne Musca   dx 2000--   Muscle spasticity    Neurogenic bladder    due to MS   Pulmonary embolism, bilateral ([place]) 09/17/2017   treated w/ eliquis   Strains to urinate    Urgency of urination    BP 114/73   Pulse 81   Opioid Risk Score:   Fall Risk Score:  `1  Depression screen [facility] 2/9     09/02/2021    9:09 AM 12/15/2016   11:42 AM 04/29/2016   11:34 AM 10/17/2015    2:19 PM 10/07/2015    5:47 PM  Depression screen PHQ 2/9  Decreased Interest 2 0 0 0 0  Down, Depressed, Hopeless 1 0 0 0 0  PHQ - 2 Score 3 0 0 0 0  Altered sleeping 1 0 0    Tired, decreased energy 2 0 0    Change in appetite 0 0 0    Feeling bad or failure about yourself  1 0 0    Trouble concentrating 0 0 0    Moving slowly or fidgety/restless 0 0 0    Suicidal thoughts 0 0 0    PHQ-9 Score 7 0 0       Review of Systems  Constitutional: Negative.    HENT: Negative.    Eyes: Negative.   Respiratory: Negative.    Cardiovascular: Negative.   Gastrointestinal: Negative.   Endocrine:  Negative.   Genitourinary: Negative.   Musculoskeletal:  Positive for back pain, gait problem and myalgias.       Spasms  Skin: Negative.   Allergic/Immunologic: Negative.   Neurological:  Positive for tremors and weakness.  Hematological:  Bruises/bleeds easily.       Apixaban  Psychiatric/Behavioral:  Positive for dysphoric mood.   All other systems reviewed and are negative.      Objective:   Physical Exam  Gen: no distress, normal appearing, in a wheelchair HEENT: oral mucosa pink and moist, NCAT Cardio: Reg rate Chest: normal effort, normal rate of breathing Abd: soft, non-distended Ext: no edema Psych: pleasant, normal affect Skin: intact Neuro: Cranial nerves II through XII intact, sensation intact to light touch in all 4 extremities, sensation intact to light touch in all 4 extremities Strength 5 out of 5 in bilateral upper extremities Strength 5 out of 5 in right lower extremity Strength 2 out of 5 left hip flexion, knee extension 4 out of 5, plantarflexion 4 out of 5, dorsiflexion 2 out of 5 Ftn normal Musculoskeletal: No joint swelling or redness noted, no range of motion deficits noted Straight leg raise negative Facet loading equivocal Lumbar paraspinal tenderness Kyphotic posture with decreased L-spine movement in all directions Abnormal gait related to left lower extremity weakness   C spine Xray 09/02/20 The cervical spine is visualized through C6-7 on lateral examination. C7-T1 is obscured by overlying osseous structure. Normal cervical lordosis. No acute fracture or listhesis of the cervical spine. Vertebral body height has been preserved. Intervertebral disc height has been preserved. The prevertebral soft tissues are unremarkable. The spinal canal is widely patent.    MRI L spine 01/23/21 FINDINGS: On sagittal  images, the spine is imaged from T11 to the sacrum.  The L5 vertebral body is partially sacralized and there is a small disc at L5-S1 (spine labeling correlating to CT scan from 05/19/2017 to identify the T12 vertebral body).  There is left hydronephrosis and hydroureter, similar in appearance to the thoracic spine MRI from 11/21/2020.  The conus medullaris and cauda equine appear normal.   The vertebral bodies are normally aligned.   Bone marrow is dark on both T1 and T2 weighted images..  The discs and interspaces were further evaluated on axial views from T11 to S1 as follows:   T11-T12: The disc and interspace appear normal.   T12-L1: The disc and interspace appear normal.   L1-L2: The disc and interspace appear normal.   L2-L3: There is mild spinal stenosis due to epidural lipomatosis and facet hypertrophy.  There is no minimal foraminal narrowing lateral recess stenosis but no nerve root compression.   L3-L4: There is mild spinal stenosis due to disc protrusion, epidural lipomatosis and mild facet hypertrophy.  There is mild foraminal narrowing but no nerve root compression.   L4-L5: There is disc bulging and epidural lipomatosis and facet hypertrophy with small joint effusions.  There is mild left foraminal narrowing but no nerve root compression.   L5-S1: This level is partially sacralized.  There is no spinal stenosis or nerve root compression.       IMPRESSION: This MRI of the lumbar spine without contrast shows the following: 1.   Bone marrow changes consistent with anemia and renal disease 2.   Left hydronephrosis and hydroureter  3.   Mild spinal stenosis at L2-L3, and L3-L4 due to mild degenerative changes and epidural lipomatosis.  There is no nerve root compression at these levels. 4.  Mild degenerative changes at L4-L5 that did not cause spinal stenosis or nerve root compression.      Assessment & Plan:  Chronic lower back pain with lumbar spondylosis, L3-L4 mild spinal  stenosis, multilevel facet joint hypertrophy, mild foraminal stenosis -Order placed for physical therapy  -UDS order placed -Pain contract -Consider restart of low-dose oxycodone  Multiple sclerosis -She has chronic left lower extremity weakness, altered gait as a result -She is followed by neurology -Continue gabapentin and elavil   Abnormal gait due to left lower extremity weakness and left foot drop using AFO -This may be also contributing to her lower back pain

## 2021-09-08 ENCOUNTER — Ambulatory Visit: Payer: Medicare HMO | Attending: Physical Medicine & Rehabilitation | Admitting: Physical Therapy

## 2021-09-08 ENCOUNTER — Encounter: Payer: Self-pay | Admitting: Oncology

## 2021-09-08 LAB — TOXASSURE SELECT,+ANTIDEPR,UR

## 2021-09-08 NOTE — Therapy (Deleted)
OUTPATIENT PHYSICAL THERAPY THORACOLUMBAR EVALUATION   Patient Name: Dawn Peters MRN: [id number] DOB:May 18, 1980, 41 y.o., female Today's Date: 09/08/2021    Past Medical History:  Diagnosis Date   Anticoagulant long-term use    Eliquis   Anxiety    Chronic pain    CKD (chronic kidney disease), stage III ([place])    Depression    DVT, lower extremity, recurrent ([place]) 09/17/2017   left lower extremity-- treated with eliquis   Gait disturbance    due to MS   GERD (gastroesophageal reflux disease)    watch diet   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/p  THA   History of DVT of lower extremity 2007   History of encephalopathy 40/1027   acute metabolic encephalopathy secondary to MS,  resolved   History of MRSA infection 2004   right hip infection post THA   History of pulmonary embolus (PE) 2005   Hydronephrosis, left    w/ acute kidney injury 02/ 2019   Hypertension    Left-sided weakness    due to MS   Migraines    MS (multiple sclerosis) [facility]) neurologist-  dr Renne Musca   dx 2000--   Muscle spasticity    Neurogenic bladder    due to MS   Pulmonary embolism, bilateral ([place]) 09/17/2017   treated w/ eliquis   Strains to urinate    Urgency of urination    Past Surgical History:  Procedure Laterality Date   CYSTOSCOPY WITH RETROGRADE PYELOGRAM, URETEROSCOPY AND STENT PLACEMENT Bilateral 11/29/2017   Procedure: BILATERAL  RETROGRADE PYELOGRAM, LEFT DIAGNOSIC URETEROSCOPY AND STENT LEFT PLACEMENT;  Surgeon: Ardis Hughs, MD;  Location: [facility];  Service: Urology;  Laterality: Bilateral;   HIP ARTHROSCOPY Left 07-05-2013   dr pill '@[facility]'$    iliopsosas release, synovectomy   REVISION TOTAL HIP ARTHROPLASTY Right 03-28-2003    dr Alvan Dame '@[facility]'$    TOTAL HIP ARTHROPLASTY Bilateral left 11-05-2002  dr Alvan Dame '@[facility]'$ ;   right 06/ 2004  '@[facility]    Patient Active Problem List   Diagnosis Date Noted    Generalized weakness 04/19/2021   GERD (gastroesophageal reflux disease)    AKI (acute kidney injury) ([place]) 02/24/2021   HLD (hyperlipidemia) 02/24/2021   Hypoproliferative anemia ([place]) 25/36/6440   Folic acid deficiency 34/74/2595   Acute on chronic anemia 02/08/2021   Symptomatic anemia 11/21/2020   Amenorrhea 11/20/2018   Well woman exam 11/20/2018   MS (multiple sclerosis) ([place]) 03/14/2018   Stage 3a chronic kidney disease (CKD) ([place])    Obesity (BMI 30.0-34.9)    Anemia 01/09/2018   Pulmonary embolism ([place]) 09/18/2017   Elevated serum creatinine 05/12/2017   Acute encephalopathy 04/20/2017   Hydronephrosis of left kidney 10/24/2016   Attention deficit 10/24/2016   Left leg weakness 08/13/2016   Chronic pain 08/13/2016   Mild renal insufficiency 08/13/2016   Left-sided weakness    High risk medication use 09/15/2015   Hip pain, bilateral 07/28/2015   Urinary hesitancy 07/28/2015   Multiple sclerosis exacerbation ([place]) 07/17/2015   Urinary disorder 06/11/2015   Gait disturbance 05/19/2015   Numbness 05/19/2015   Depression with anxiety 05/13/2015   Other fatigue 05/13/2015   Left knee pain 05/13/2015   Avascular necrosis of bones of both hips ([place]) 05/13/2015   Screening for HIV (human immunodeficiency virus) 07/08/2014   Essential hypertension, benign 11/19/2013   Anxiety state, unspecified 11/19/2013   Screen for STD (sexually transmitted disease) 11/01/2013  Encounter for routine gynecological examination 11/01/2013   Oral contraceptive use 10/20/2011   Multiple sclerosis ([place]) 08/08/2007   RASH AND OTHER NONSPECIFIC SKIN ERUPTION 06/22/2007   AVASCULAR NECROSIS, FEMORAL HEAD 03/11/2006   Essential hypertension 02/03/2006   MICROALBUMINURIA 02/03/2006   DVT, HX OF 02/03/2006    PCP: ***  REFERRING PROVIDER: ***  REFERRING DIAG: ***  Rationale for Evaluation and Treatment {HABREHAB:27488}  THERAPY DIAG:  No diagnosis found.  ONSET DATE:  ***   SUBJECTIVE:         SUBJECTIVE STATEMENT: ***  PERTINENT HISTORY:  Multiple sclerosis, chronic pain, history of bilateral THA, hypertension, history of DVT/PE, chronic kidney disease, depression  PAIN:  Are you having pain? Yes: NPRS scale: ***/10 Pain location: *** Pain description: *** Aggravating factors: *** Relieving factors: ***   PRECAUTIONS: Fall  WEIGHT BEARING RESTRICTIONS No  FALLS:  Has patient fallen in last 6 months? {fallsyesno:27318}  LIVING ENVIRONMENT: Lives with: {OPRC lives with:25569::"lives with their family"} Lives in: {Lives in:25570} Stairs: {opstairs:27293} Has following equipment at home: {Assistive devices:23999}  OCCUPATION: Disability  PLOF: {PLOF:24004}  PATIENT GOALS: ***   OBJECTIVE:  DIAGNOSTIC FINDINGS:  ***  PATIENT SURVEYS:  {rehab surveys:24030}  SCREENING FOR RED FLAGS: ***  COGNITION: Overall cognitive status: Within functional limits for tasks assessed     SENSATION: {sensation:27233}  MUSCLE LENGTH: ***  POSTURE:   ***  PALPATION: ***  LUMBAR ROM:   ***  LOWER EXTREMITY ROM:     {AROM/PROM:27142}  Right eval Left eval  Hip flexion    Hip extension    Hip abduction    Hip adduction    Hip internal rotation    Hip external rotation    Knee flexion    Knee extension    Ankle dorsiflexion    Ankle plantarflexion    Ankle inversion    Ankle eversion     LOWER EXTREMITY MMT:    MMT Right eval Left eval  Hip flexion    Hip extension    Hip abduction    Knee flexion    Knee extension    Ankle dorsiflexion    Ankle plantarflexion    Ankle inversion    Ankle eversion     FUNCTIONAL TESTS:  {Functional tests:24029}  GAIT: Distance walked: *** Assistive device utilized: {Assistive devices:23999} Level of assistance: {Levels of assistance:24026} Comments: ***   TODAY'S TREATMENT  ***   PATIENT EDUCATION:  Education details: Exam findings, POC, HEP Person educated:  Patient Education method: Explanation, Demonstration, Tactile cues, Verbal cues, and Handouts Education comprehension: verbalized understanding, returned demonstration, verbal cues required, tactile cues required, and needs further education  HOME EXERCISE PROGRAM: ***   ASSESSMENT: CLINICAL IMPRESSION: Patient is a 41 y.o. female who was seen today for physical therapy evaluation and treatment for ***.    OBJECTIVE IMPAIRMENTS {opptimpairments:25111}.   ACTIVITY LIMITATIONS {activitylimitations:27494}  PARTICIPATION LIMITATIONS: {participationrestrictions:25113}  PERSONAL FACTORS {Personal factors:25162} are also affecting patient's functional outcome.   REHAB POTENTIAL: {rehabpotential:25112}  CLINICAL DECISION MAKING: {clinical decision making:25114}  EVALUATION COMPLEXITY: {Evaluation complexity:25115}   GOALS: Goals reviewed with patient? Yes  SHORT TERM GOALS: Target date: {follow up:25551}  Patient will be I with initial HEP in order to progress with therapy. Baseline: HEP provided at eval Goal status: INITIAL  2.  *** Baseline:  Goal status: INITIAL  3.  *** Baseline:  Goal status: INITIAL  LONG TERM GOALS: Target date: {follow up:25551}  Patient will be I with final HEP to maintain progress from  PT. Baseline: HEP provided at eval Goal status: INITIAL  2.  *** Baseline:  Goal status: INITIAL  3.  *** Baseline:  Goal status: INITIAL  4.  *** Baseline:  Goal status: INITIAL   PLAN: PT FREQUENCY: {rehab frequency:25116}  PT DURATION: {rehab duration:25117}  PLANNED INTERVENTIONS: {rehab planned interventions:25118::"Therapeutic exercises","Therapeutic activity","Neuromuscular re-education","Balance training","Gait training","Patient/Family education","Joint mobilization"}.  PLAN FOR NEXT SESSION: ***   Hilda Blades, PT, DPT, LAT, ATC 09/08/21  1:17 PM Phone: [phone] Fax: [phone]

## 2021-09-09 ENCOUNTER — Inpatient Hospital Stay: Payer: Medicare HMO

## 2021-09-09 ENCOUNTER — Inpatient Hospital Stay: Payer: Medicare HMO | Attending: Physician Assistant

## 2021-09-09 ENCOUNTER — Telehealth: Payer: Self-pay | Admitting: *Deleted

## 2021-09-09 ENCOUNTER — Other Ambulatory Visit: Payer: Self-pay

## 2021-09-09 DIAGNOSIS — D631 Anemia in chronic kidney disease: Secondary | ICD-10-CM | POA: Diagnosis present

## 2021-09-09 DIAGNOSIS — D619 Aplastic anemia, unspecified: Secondary | ICD-10-CM | POA: Diagnosis not present

## 2021-09-09 DIAGNOSIS — N1831 Chronic kidney disease, stage 3a: Secondary | ICD-10-CM | POA: Diagnosis not present

## 2021-09-09 DIAGNOSIS — D649 Anemia, unspecified: Secondary | ICD-10-CM

## 2021-09-09 LAB — CBC WITH DIFFERENTIAL (CANCER CENTER ONLY)
Abs Immature Granulocytes: 0.01 10*3/uL (ref 0.00–0.07)
Basophils Absolute: 0 10*3/uL (ref 0.0–0.1)
Basophils Relative: 0 %
Eosinophils Absolute: 0.1 10*3/uL (ref 0.0–0.5)
Eosinophils Relative: 2 %
HCT: 33.6 % — ABNORMAL LOW (ref 36.0–46.0)
Hemoglobin: 11.4 g/dL — ABNORMAL LOW (ref 12.0–15.0)
Immature Granulocytes: 0 %
Lymphocytes Relative: 7 %
Lymphs Abs: 0.2 10*3/uL — ABNORMAL LOW (ref 0.7–4.0)
MCH: 33.1 pg (ref 26.0–34.0)
MCHC: 33.9 g/dL (ref 30.0–36.0)
MCV: 97.7 fL (ref 80.0–100.0)
Monocytes Absolute: 0.3 10*3/uL (ref 0.1–1.0)
Monocytes Relative: 10 %
Neutro Abs: 2.6 10*3/uL (ref 1.7–7.7)
Neutrophils Relative %: 81 %
Platelet Count: 234 10*3/uL (ref 150–400)
RBC: 3.44 MIL/uL — ABNORMAL LOW (ref 3.87–5.11)
RDW: 18.6 % — ABNORMAL HIGH (ref 11.5–15.5)
WBC Count: 3.2 10*3/uL — ABNORMAL LOW (ref 4.0–10.5)
nRBC: 0 % (ref 0.0–0.2)

## 2021-09-09 LAB — SAMPLE TO BLOOD BANK

## 2021-09-09 NOTE — Telephone Encounter (Signed)
Urine drug screen for this encounter is consistent for prescribed medication, but is also positive for cocaine and its metabolite.

## 2021-09-09 NOTE — Progress Notes (Signed)
Patients HGB at 11.4 above hold level for injection

## 2021-09-21 ENCOUNTER — Other Ambulatory Visit: Payer: Self-pay | Admitting: Neurology

## 2021-09-21 MED ORDER — AMPHETAMINE-DEXTROAMPHET ER 20 MG PO CP24
ORAL_CAPSULE | ORAL | 0 refills | Status: DC
Start: 2021-09-21 — End: 2021-10-18

## 2021-09-21 NOTE — Telephone Encounter (Signed)
Reviewed pt chart. Last seen 07/01/21 and next f/u 01/25/22.  Rx amitriptyline last sent 08/24/21 #180 (90days supply). Too early to send in refill for this. Per drug registry, last refilled adderall 08/14/21 #30.

## 2021-09-21 NOTE — Telephone Encounter (Signed)
Pt is calling and requesting a refill on amphetamine-dextroamphetamine (ADDERALL XR) 20 MG 24 hr capsule and amitriptyline (ELAVIL) 25 MG tablet. Pt would like this prescription sent to [facility]. Pt has an appointment set for 11/21

## 2021-09-22 ENCOUNTER — Telehealth (HOSPITAL_COMMUNITY): Payer: Self-pay | Admitting: Physical Medicine & Rehabilitation

## 2021-09-23 ENCOUNTER — Inpatient Hospital Stay: Payer: Medicare HMO

## 2021-09-23 ENCOUNTER — Other Ambulatory Visit: Payer: Self-pay

## 2021-09-23 ENCOUNTER — Encounter: Payer: Self-pay | Admitting: Oncology

## 2021-09-23 DIAGNOSIS — D649 Anemia, unspecified: Secondary | ICD-10-CM

## 2021-09-23 DIAGNOSIS — N1831 Chronic kidney disease, stage 3a: Secondary | ICD-10-CM | POA: Diagnosis not present

## 2021-09-23 LAB — CBC WITH DIFFERENTIAL (CANCER CENTER ONLY)
Abs Immature Granulocytes: 0 10*3/uL (ref 0.00–0.07)
Basophils Absolute: 0 10*3/uL (ref 0.0–0.1)
Basophils Relative: 1 %
Eosinophils Absolute: 0.1 10*3/uL (ref 0.0–0.5)
Eosinophils Relative: 5 %
HCT: 38.7 % (ref 36.0–46.0)
Hemoglobin: 13.5 g/dL (ref 12.0–15.0)
Immature Granulocytes: 0 %
Lymphocytes Relative: 15 %
Lymphs Abs: 0.3 10*3/uL — ABNORMAL LOW (ref 0.7–4.0)
MCH: 33.4 pg (ref 26.0–34.0)
MCHC: 34.9 g/dL (ref 30.0–36.0)
MCV: 95.8 fL (ref 80.0–100.0)
Monocytes Absolute: 0.4 10*3/uL (ref 0.1–1.0)
Monocytes Relative: 17 %
Neutro Abs: 1.4 10*3/uL — ABNORMAL LOW (ref 1.7–7.7)
Neutrophils Relative %: 62 %
Platelet Count: 229 10*3/uL (ref 150–400)
RBC: 4.04 MIL/uL (ref 3.87–5.11)
RDW: 14.8 % (ref 11.5–15.5)
WBC Count: 2.2 10*3/uL — ABNORMAL LOW (ref 4.0–10.5)
nRBC: 0 % (ref 0.0–0.2)

## 2021-09-23 LAB — SAMPLE TO BLOOD BANK

## 2021-09-23 NOTE — Progress Notes (Signed)
Pt's Hgb is 13.5 therefore she does not meet the parameters to receive Retacrit today. Labs printed and pt verbalized understanding.

## 2021-09-28 NOTE — Telephone Encounter (Signed)
From Dr Curlene Dolphin :Left MessageCommunicated - she answered the phone initially, i stated my name and initallyasked it if was a good time to talk. She asked me to call back in 30 minutes. I called back as requested, no answer, left discrete VM

## 2021-10-04 ENCOUNTER — Other Ambulatory Visit: Payer: Self-pay | Admitting: Oncology

## 2021-10-06 ENCOUNTER — Other Ambulatory Visit: Payer: Self-pay | Admitting: Neurology

## 2021-10-07 ENCOUNTER — Inpatient Hospital Stay: Payer: Medicare HMO

## 2021-10-07 ENCOUNTER — Inpatient Hospital Stay: Payer: Medicare HMO | Attending: Physician Assistant

## 2021-10-07 DIAGNOSIS — D649 Anemia, unspecified: Secondary | ICD-10-CM | POA: Insufficient documentation

## 2021-10-14 ENCOUNTER — Inpatient Hospital Stay: Payer: Medicare HMO

## 2021-10-14 DIAGNOSIS — D649 Anemia, unspecified: Secondary | ICD-10-CM | POA: Diagnosis present

## 2021-10-14 LAB — CBC WITH DIFFERENTIAL (CANCER CENTER ONLY)
Abs Immature Granulocytes: 0 10*3/uL (ref 0.00–0.07)
Basophils Absolute: 0 10*3/uL (ref 0.0–0.1)
Basophils Relative: 1 %
Eosinophils Absolute: 0.1 10*3/uL (ref 0.0–0.5)
Eosinophils Relative: 6 %
HCT: 38.8 % (ref 36.0–46.0)
Hemoglobin: 13.5 g/dL (ref 12.0–15.0)
Immature Granulocytes: 0 %
Lymphocytes Relative: 13 %
Lymphs Abs: 0.3 10*3/uL — ABNORMAL LOW (ref 0.7–4.0)
MCH: 33.8 pg (ref 26.0–34.0)
MCHC: 34.8 g/dL (ref 30.0–36.0)
MCV: 97 fL (ref 80.0–100.0)
Monocytes Absolute: 0.3 10*3/uL (ref 0.1–1.0)
Monocytes Relative: 16 %
Neutro Abs: 1.3 10*3/uL — ABNORMAL LOW (ref 1.7–7.7)
Neutrophils Relative %: 64 %
Platelet Count: 190 10*3/uL (ref 150–400)
RBC: 4 MIL/uL (ref 3.87–5.11)
RDW: 12.9 % (ref 11.5–15.5)
WBC Count: 1.9 10*3/uL — ABNORMAL LOW (ref 4.0–10.5)
nRBC: 0 % (ref 0.0–0.2)

## 2021-10-14 LAB — SAMPLE TO BLOOD BANK

## 2021-10-17 ENCOUNTER — Other Ambulatory Visit: Payer: Self-pay | Admitting: Neurology

## 2021-10-18 ENCOUNTER — Telehealth: Payer: Self-pay | Admitting: Neurology

## 2021-10-18 ENCOUNTER — Other Ambulatory Visit: Payer: Self-pay | Admitting: Neurology

## 2021-10-18 ENCOUNTER — Encounter: Payer: Self-pay | Admitting: Physical Medicine & Rehabilitation

## 2021-10-18 ENCOUNTER — Encounter: Payer: Medicare HMO | Attending: Physical Medicine and Rehabilitation | Admitting: Physical Medicine & Rehabilitation

## 2021-10-18 VITALS — BP 117/80 | HR 73 | Ht 66.0 in | Wt 217.0 lb

## 2021-10-18 DIAGNOSIS — M545 Low back pain, unspecified: Secondary | ICD-10-CM | POA: Diagnosis present

## 2021-10-18 DIAGNOSIS — G35 Multiple sclerosis: Secondary | ICD-10-CM | POA: Insufficient documentation

## 2021-10-18 DIAGNOSIS — G8929 Other chronic pain: Secondary | ICD-10-CM | POA: Insufficient documentation

## 2021-10-18 DIAGNOSIS — M47816 Spondylosis without myelopathy or radiculopathy, lumbar region: Secondary | ICD-10-CM | POA: Insufficient documentation

## 2021-10-18 DIAGNOSIS — F1410 Cocaine abuse, uncomplicated: Secondary | ICD-10-CM | POA: Insufficient documentation

## 2021-10-18 MED ORDER — AMITRIPTYLINE HCL 25 MG PO TABS: ORAL_TABLET | ORAL | 0 refills | Status: DC

## 2021-10-18 MED ORDER — CITALOPRAM HYDROBROMIDE 20 MG PO TABS: 20.0000 mg | ORAL_TABLET | Freq: Every day | ORAL | 1 refills | Status: DC

## 2021-10-18 MED ORDER — AMPHETAMINE-DEXTROAMPHET ER 20 MG PO CP24: ORAL_CAPSULE | ORAL | 0 refills | Status: DC

## 2021-10-18 NOTE — Telephone Encounter (Signed)
Pt is calling and requesting refill on amphetamine-dextroamphetamine (ADDERALL XR) 20 MG 24 hr capsule and citalopram (CELEXA) 20 MG tablet, dalfampridine 10 MG TB12,amitriptyline (ELAVIL) 25 MG tablet . Pt is requesting prescription be sent to [place], [place]

## 2021-10-18 NOTE — Telephone Encounter (Signed)
Adderall refill was sent in a separate refill note.  The dalfampridine was sent on 10/07/21 with years worth of refill. I will send celexa and amitriptyline refill for the pt for a 90 day supply

## 2021-10-18 NOTE — Progress Notes (Unsigned)
Subjective:    Patient ID: Dawn Peters, female    DOB: 07/30/1980, 41 y.o.   MRN: [id number]  HPI HPI 09/02/21 41 year old female with past medical history of MS, depression, CKD, chronic lower back pain who is here for evaluation of her chronic pain.  She reports she has had back pain for at least 6 years.  Pain has been gradually worsening.  Her worst pain is in her lower back and she has milder pain in her neck.  She has been using a wheelchair for her mobility.  She can walk shorter distances with a cane or a walker.  Activities such as walking worsen her pain.  She has left lower extremity weakness that is chronic due to her MS.  She reports she has an AFO for her left lower extremity.  She takes gabapentin for neuropathic pain with benefit.  She also takes Elavil 25 mg which helps her mood and sleep.  She also takes Celexa for her mood.  She also takes Tylenol for her back pain that provides mild benefit.  In the past she has worked with physical therapy and found this to be helpful to her pain. She reports she was previously followed by [facility] over a year ago and was taking Percocet. Patient reports she was more active and less limited in her activities when she was using oxycodone for pain control. She reports she was discharged due to a positive cocaine result on UDS.  Patient says she did not knowingly take cocaine at the time and says she does not currently use the substance.  She cannot take NSAIDs due to her CKD.  In the past she also had Lyrica , it helped her neuropathic pain similar to gabapentin.  Tramadol provided a mild benefit.  She thinks she may have had hydrocodone in the past that also provided a mild to moderate benefit.    Interval history Dawn Peters is here for follow-up for chronic pain.  She reports she is scheduled to start PT in the next week or two.  She continues to have lower back and neck pain.  She continues to take gabapentin 800 mg 4 times  daily and amitriptyline 25 mg daily.  She also takes Adderall.  Patient had a UDS that showed evidence of cocaine and we discussed that due to this result I would not be prescribing controlled medications.  Patient says she does not know how cocaine is present in her system and reports she never uses drugs.  She says she worked on [employer] and wonders if this is a false positive test.  She denies any recent changes in strength or sensation.  Pain Inventory Average Pain 8 Pain Right Now 8 My pain is constant, sharp, and stabbing  In the last 24 hours, has pain interfered with the following? General activity 6 Relation with others 0 Enjoyment of life 6 What TIME of day is your pain at its worst? daytime, night, and varies Sleep (in general) Fair  Pain is worse with: walking, bending, and standing Pain improves with: rest, therapy/exercise, and medication Relief from Meds: 6  Family History  Problem Relation Age of Onset   Healthy Mother    Healthy Father    Colon cancer Paternal Grandmother    Breast cancer Paternal Grandmother 74   Hypertension Other    Diabetes Other    Social History   Socioeconomic History   Marital status: Single    Spouse name: Not on  file   Number of children: Not on file   Years of education: Not on file   Highest education level: Not on file  Occupational History   Not on file  Tobacco Use   Smoking status: Never   Smokeless tobacco: Never  Vaping Use   Vaping Use: Never used  Substance and Sexual Activity   Alcohol use: No   Drug use: No   Sexual activity: Not Currently  Other Topics Concern   Not on file  Social History Narrative   Not on file   Social Determinants of Health   Financial Resource Strain: Not on file  Food Insecurity: Not on file  Transportation Needs: Not on file  Physical Activity: Not on file  Stress: Not on file  Social Connections: Not on file   Past Surgical History:  Procedure Laterality Date    CYSTOSCOPY WITH RETROGRADE PYELOGRAM, URETEROSCOPY AND STENT PLACEMENT Bilateral 11/29/2017   Procedure: [place], LEFT DIAGNOSIC URETEROSCOPY [place];  Surgeon: Ardis Hughs, MD;  Location: [facility];  Service: Urology;  Laterality: Bilateral;   HIP ARTHROSCOPY Left 07-05-2013   dr pill '@[facility]'$    iliopsosas release, synovectomy   REVISION TOTAL HIP ARTHROPLASTY Right 03-28-2003    dr Alvan Dame '@[facility]'$    TOTAL HIP ARTHROPLASTY Bilateral left 11-05-2002  dr Alvan Dame '@[facility]'$ ;   right 06/ 2004  '@[facility]    Past Surgical History:  Procedure Laterality Date   CYSTOSCOPY WITH RETROGRADE PYELOGRAM, URETEROSCOPY AND STENT PLACEMENT Bilateral 11/29/2017   Procedure: BILATERAL  RETROGRADE PYELOGRAM, LEFT DIAGNOSIC URETEROSCOPY AND STENT LEFT PLACEMENT;  Surgeon: Ardis Hughs, MD;  Location: [facility];  Service: Urology;  Laterality: Bilateral;   HIP ARTHROSCOPY Left 07-05-2013   dr pill '@[facility]'$    iliopsosas release, synovectomy   REVISION TOTAL HIP ARTHROPLASTY Right 03-28-2003    dr Alvan Dame '@[facility]'$    TOTAL HIP ARTHROPLASTY Bilateral left 11-05-2002  dr Alvan Dame '@[facility]'$ ;   right 06/ 2004  '@[facility]    Past Medical History:  Diagnosis Date   Anticoagulant long-term use    Eliquis   Anxiety    Chronic pain    CKD (chronic kidney disease), stage III ([place])    Depression    DVT, lower extremity, recurrent ([place]) 09/17/2017   left lower extremity-- treated with eliquis   Gait disturbance    due to MS   GERD (gastroesophageal reflux disease)    watch diet   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/p  THA   History of DVT of lower extremity 2007   History of encephalopathy 97/3532   acute metabolic encephalopathy secondary to MS,  resolved   History of MRSA infection 2004   right hip infection post THA   History of pulmonary embolus (PE) 2005   Hydronephrosis, left    w/ acute kidney injury  02/ 2019   Hypertension    Left-sided weakness    due to MS   Migraines    MS (multiple sclerosis) [facility]) neurologist-  dr Renne Musca   dx 2000--   Muscle spasticity    Neurogenic bladder    due to MS   Pulmonary embolism, bilateral ([place]) 09/17/2017   treated w/ eliquis   Strains to urinate    Urgency of urination    Ht '5\' 6"'$  (1.676 m)   Wt 217 lb (98.4 kg)   BMI 35.02 kg/m   Opioid Risk Score:   Fall  Risk Score:  `1  Depression screen PHQ 2/9     10/18/2021    1:22 PM 09/02/2021    9:09 AM 12/15/2016   11:42 AM 04/29/2016   11:34 AM 10/17/2015    2:19 PM 10/07/2015    5:47 PM  Depression screen PHQ 2/9  Decreased Interest 1 2 0 0 0 0  Down, Depressed, Hopeless 1 1 0 0 0 0  PHQ - 2 Score 2 3 0 0 0 0  Altered sleeping  1 0 0    Tired, decreased energy  2 0 0    Change in appetite  0 0 0    Feeling bad or failure about yourself   1 0 0    Trouble concentrating  0 0 0    Moving slowly or fidgety/restless  0 0 0    Suicidal thoughts  0 0 0    PHQ-9 Score  7 0 0      Review of Systems  Musculoskeletal:  Positive for back pain and gait problem.       Both hips, both shoulders, lower back pain       Objective:   Physical Exam  Gen: no distress, normal appearing, in a wheelchair HEENT: oral mucosa pink and moist, NCAT Cardio: Reg rate Chest: normal effort, normal rate of breathing Abd: soft, non-distended Ext: no edema Psych: pleasant, normal affect Skin: intact Neuro: Alert, follows command, sensation intact to light touch in all 4 extremities,  Cranial nerves II through XII intact, sensation intact to light touch in all 4 extremities,  Kyphotic posture Lumbar paraspinal tenderness noted No joint swelling noted       Assessment & Plan:  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  I was told this is unlikely unless cocaine was used for a dental or ENT procedure.  Multiple sclerosis, she denies any recent changes in sensation or strength -She has chronic left lower extremity weakness with LLE weakness and foot drop -PT as above -Continue f/u with neurology

## 2021-10-18 NOTE — Addendum Note (Signed)
Addended by: Darleen Crocker on: 10/18/2021 04:12 PM   Modules accepted: Orders

## 2021-10-24 ENCOUNTER — Other Ambulatory Visit: Payer: Self-pay | Admitting: Neurology

## 2021-10-27 ENCOUNTER — Other Ambulatory Visit: Payer: Self-pay | Admitting: Neurology

## 2021-11-03 ENCOUNTER — Inpatient Hospital Stay: Payer: Medicare HMO

## 2021-11-03 ENCOUNTER — Telehealth: Payer: Self-pay | Admitting: *Deleted

## 2021-11-03 ENCOUNTER — Inpatient Hospital Stay: Payer: Medicare HMO | Admitting: Oncology

## 2021-11-03 NOTE — Telephone Encounter (Signed)
PC to patient regarding missed appointments today, no answer, left VM - informed patient our scheduling department will contact her or she may call [phone] to reschedule.  Scheduling message sent.

## 2021-11-04 ENCOUNTER — Telehealth: Payer: Self-pay | Admitting: Oncology

## 2021-11-04 NOTE — Telephone Encounter (Signed)
Contacted patient to rescheduled missed appointment. Left message for patient to call back to schedule appointment.

## 2021-11-05 ENCOUNTER — Inpatient Hospital Stay: Payer: Medicare HMO | Attending: Physician Assistant

## 2021-11-05 ENCOUNTER — Other Ambulatory Visit: Payer: Self-pay

## 2021-11-05 DIAGNOSIS — D619 Aplastic anemia, unspecified: Secondary | ICD-10-CM | POA: Insufficient documentation

## 2021-11-05 DIAGNOSIS — D649 Anemia, unspecified: Secondary | ICD-10-CM

## 2021-11-05 LAB — CBC WITH DIFFERENTIAL (CANCER CENTER ONLY)
Abs Immature Granulocytes: 0.01 10*3/uL (ref 0.00–0.07)
Basophils Absolute: 0 10*3/uL (ref 0.0–0.1)
Basophils Relative: 0 %
Eosinophils Absolute: 0.1 10*3/uL (ref 0.0–0.5)
Eosinophils Relative: 5 %
HCT: 39 % (ref 36.0–46.0)
Hemoglobin: 14 g/dL (ref 12.0–15.0)
Immature Granulocytes: 0 %
Lymphocytes Relative: 11 %
Lymphs Abs: 0.3 10*3/uL — ABNORMAL LOW (ref 0.7–4.0)
MCH: 33.7 pg (ref 26.0–34.0)
MCHC: 35.9 g/dL (ref 30.0–36.0)
MCV: 93.8 fL (ref 80.0–100.0)
Monocytes Absolute: 0.3 10*3/uL (ref 0.1–1.0)
Monocytes Relative: 13 %
Neutro Abs: 1.9 10*3/uL (ref 1.7–7.7)
Neutrophils Relative %: 71 %
Platelet Count: 215 10*3/uL (ref 150–400)
RBC: 4.16 MIL/uL (ref 3.87–5.11)
RDW: 12.8 % (ref 11.5–15.5)
WBC Count: 2.7 10*3/uL — ABNORMAL LOW (ref 4.0–10.5)
nRBC: 0 % (ref 0.0–0.2)

## 2021-11-05 LAB — SAMPLE TO BLOOD BANK

## 2021-11-10 ENCOUNTER — Ambulatory Visit: Payer: Medicare HMO | Attending: Physical Medicine & Rehabilitation | Admitting: Physical Therapy

## 2021-11-10 ENCOUNTER — Other Ambulatory Visit: Payer: Self-pay

## 2021-11-10 ENCOUNTER — Encounter: Payer: Self-pay | Admitting: Physical Therapy

## 2021-11-10 DIAGNOSIS — M6281 Muscle weakness (generalized): Secondary | ICD-10-CM | POA: Diagnosis present

## 2021-11-10 DIAGNOSIS — M5459 Other low back pain: Secondary | ICD-10-CM | POA: Insufficient documentation

## 2021-11-10 DIAGNOSIS — R2689 Other abnormalities of gait and mobility: Secondary | ICD-10-CM | POA: Insufficient documentation

## 2021-11-10 NOTE — Therapy (Addendum)
OUTPATIENT PHYSICAL THERAPY THORACOLUMBAR EVALUATION + DISCHARGE SUMMARY   Patient Name: Dawn Peters MRN: [id number] DOB:[birth year], 41 y.o., female Today's Date: 11/10/2021  PHYSICAL THERAPY DISCHARGE SUMMARY  Visits from Start of Care: 1  Current functional level related to goals / functional outcomes: Unable to assess   Remaining deficits: Unable to assess   Education / Equipment: Unable to assess   Patient unable to be reached after evaluation. Patient goals were not met. Patient is being discharged due to not returning since the last visit.     PT End of Session - 11/10/21 1435     Visit Number 1    Number of Visits 11    Date for PT Re-Evaluation 12/22/21    Authorization Type [employer]    Authorization - Number of Visits 11    Progress Note Due on Visit 10    PT Start Time 1440    PT Stop Time 1525    PT Time Calculation (min) 45 min    Activity Tolerance Patient tolerated treatment well    Behavior During Therapy WFL for tasks assessed/performed             Past Medical History:  Diagnosis Date   Anticoagulant long-term use    Eliquis   Anxiety    Chronic pain    CKD (chronic kidney disease), stage III ([place])    Depression    DVT, lower extremity, recurrent ([place]) 09/17/2017   left lower extremity-- treated with eliquis   Gait disturbance    due to MS   GERD (gastroesophageal reflux disease)    watch diet   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/p  THA   History of DVT of lower extremity 2007   History of encephalopathy 84/1324   acute metabolic encephalopathy secondary to MS,  resolved   History of MRSA infection 2004   right hip infection post THA   History of pulmonary embolus (PE) 2005   Hydronephrosis, left    w/ acute kidney injury 02/ 2019   Hypertension    Left-sided weakness    due to MS   Migraines    MS (multiple sclerosis) [facility]) neurologist-  dr Renne Musca   dx  2000--   Muscle spasticity    Neurogenic bladder    due to MS   Pulmonary embolism, bilateral ([place]) 09/17/2017   treated w/ eliquis   Strains to urinate    Urgency of urination    Past Surgical History:  Procedure Laterality Date   CYSTOSCOPY WITH RETROGRADE PYELOGRAM, URETEROSCOPY AND STENT PLACEMENT Bilateral 11/29/2017   Procedure: BILATERAL  RETROGRADE PYELOGRAM, LEFT DIAGNOSIC URETEROSCOPY AND STENT LEFT PLACEMENT;  Surgeon: Ardis Hughs, MD;  Location: [facility];  Service: Urology;  Laterality: Bilateral;   HIP ARTHROSCOPY Left 07-05-2013   dr pill _0    iliopsosas release, synovectomy   REVISION TOTAL HIP ARTHROPLASTY Right 03-28-2003    dr Alvan Dame _1    TOTAL HIP ARTHROPLASTY Bilateral left 11-05-2002  dr Alvan Dame _2 ;   right 06/ 2004  _3    Patient Active Problem List   Diagnosis Date Noted   Generalized weakness 04/19/2021   GERD (gastroesophageal reflux disease)    AKI (acute kidney injury) ([address]) 02/24/2021   HLD (hyperlipidemia) 02/24/2021   Hypoproliferative anemia ([place]) 40/12/2723   Folic acid deficiency 36/64/4034   Acute on chronic anemia 02/08/2021   Symptomatic anemia 11/21/2020   Amenorrhea 11/20/2018   Well  woman exam 11/20/2018   MS (multiple sclerosis) ([place]) 03/14/2018   Stage 3a chronic kidney disease (CKD) ([facility])    Obesity (BMI 30.0-34.9)    Anemia 01/09/2018   Pulmonary embolism ([place]) 09/18/2017   Elevated serum creatinine 05/12/2017   Acute encephalopathy 04/20/2017   Hydronephrosis of left kidney 10/24/2016   Attention deficit 10/24/2016   Left leg weakness 08/13/2016   Chronic pain 08/13/2016   Mild renal insufficiency 08/13/2016   Left-sided weakness    High risk medication use 09/15/2015   Hip pain, bilateral 07/28/2015   Urinary hesitancy 07/28/2015   Multiple sclerosis exacerbation ([place]) 07/17/2015   Urinary disorder 06/11/2015   Gait disturbance 05/19/2015   Numbness 05/19/2015   Depression with anxiety  05/13/2015   Other fatigue 05/13/2015   Left knee pain 05/13/2015   Avascular necrosis of bones of both hips ([place]) 05/13/2015   Screening for HIV (human immunodeficiency virus) 07/08/2014   Essential hypertension, benign 11/19/2013   Anxiety state, unspecified 11/19/2013   Screen for STD (sexually transmitted disease) 11/01/2013   Encounter for routine gynecological examination 11/01/2013   Oral contraceptive use 10/20/2011   Multiple sclerosis ([place]) 08/08/2007   RASH AND OTHER NONSPECIFIC SKIN ERUPTION 06/22/2007   AVASCULAR NECROSIS, FEMORAL HEAD 03/11/2006   Essential hypertension 02/03/2006   MICROALBUMINURIA 02/03/2006   DVT, HX OF 02/03/2006    PCP: Carylon Perches NP  REFERRING PROVIDER: Jennye Boroughs MD  REFERRING DIAG: G89.4 (ICD-10-CM) - Chronic pain syndrome  Rationale for Evaluation and Treatment Rehabilitation  THERAPY DIAG:  Other low back pain  Other abnormalities of gait and mobility  Muscle weakness (generalized)  ONSET DATE: About two years, gradually worsening no injury  SUBJECTIVE:                                                                                                                                                                                           SUBJECTIVE STATEMENT: Low back pain, weakness in L leg. Has drop foot she typically uses AFO for. Has a back brace that she states is uncomfortable, denies any relief and states she does not use. Pt reports difficulty cooking, cleaning. Reports some difficulty with prolonged walking, tends to walk bent over. Pain when driving for long periods. Has had N/T in extremities in past but she attributes to MS, pt denies any N/T with low back pain. Denies saddle anesthesia, denies bowel/bladder, night sweats. Pt states her boyfriend or her son help her walk and provide assist w/ housework/ADLs  PERTINENT HISTORY:  Hx MS, hx DVT/PE, CKD, previous B THA, use of L AFO  PAIN:  Are you having pain: yes,  7/10 NPS currently  Location: low back, L sided How would you describe your pain? Sharp, constant Best: 6/10 Worst: 8/10 Aggravating factors: prolonged sitting without repositioning, standing >43mn, bending, lifting Easing factors: repositioning, heating pad, pain patches, laying on L side    PRECAUTIONS: None  WEIGHT BEARING RESTRICTIONS No  FALLS:  Has patient fallen in last 6 months? Yes. Number of falls 2, while walking. Unsure how she fell. Sometimes requires assist to get back up, denies any injures.   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  PLOF: Requires assistive device for independence  PATIENT GOALS be able to cook, "go out and do something" without having to take as many rest breaks   OBJECTIVE:   DIAGNOSTIC FINDINGS:  None recently, per pt MRI when symptoms began showed arthritis  PATIENT SURVEYS:  Modified Oswestry 44%     COGNITION:  Overall cognitive status: Within functional limits for tasks assessed     SENSATION: WFL    POSTURE: forward head, fwd flexed trunk, hip shift towards L  PALPATION: palpable tightness B thoracolumbar paraspinals, most prominent around distal L QL and superior aspect of L glute max, mild tenderness but good relief from gentle STM to distal QL B  LUMBAR ROM:   Active  A/PROM  eval  Flexion 100%  Extension <25%  Right rotation 50%  Left rotation 50%   (Blank rows = not tested)  Comments: reports stiffness with mobility in all directions, reports relief with extension, difficulty maintaining extension past neutral    LOWER EXTREMITY MMT:    MMT Right eval Left eval  Hip flexion 4 3+  Hip abduction (modified sitting) 4+ 4+  Hip internal rotation 4- 3+  Hip external  rotation 4- 3+  Knee flexion 4+ 4-  Knee extension 4+ 4-   (Blank rows = not tested)  Comments: denies any pain, reports subjective general weakness LLE   FUNCTIONAL TESTS:  5xSTS w 16sec, standard chair, heavy UE use, reduced velocity with repetition  Sit to stand: Narrow BOS, reduced fwd weight shift, heavy AD use, inc WB in RLE w/ hip shift towards L  GAIT: Distance walked: within clinic Assistive device utilized: Single point cane Level of assistance: CGA + handheld assist on LUE, SPC in RUE Comments: multiple near LOB w/o assist (tends to be towards L) - provided HHA and CGA overall with use of SPC. Maintains significant fwd flexed posture, step to pattern leading w/ R, reduced truncal rotation, excessively anterior positioning of SPC.     TODAY'S TREATMENT  Performed and reviewed HEP, cues as needed for appropriate performance, education as below   PATIENT EDUCATION:  Education details: Pt education on PT impairments, prognosis, and POC. Rationale for interventions, safe/appropriate HEP performance. Discussed strategies to maximize safety w/ mobility and mitigate fall risk, strongly recommended assist with ambulation Person educated: Patient Education method: Explanation, Demonstration, Tactile cues, Verbal cues, and Handouts Education comprehension: verbalized understanding, returned demonstration, verbal cues required, tactile cues required, and needs further education    HOME EXERCISE PROGRAM: Access Code: [email or web address]: [email or web address] Date: 11/10/2021 Prepared by: DEnis Slipper Exercises - Seated Thoracic Lumbar Extension  - 1 x daily - 7 x weekly - 3 sets - 10 reps - 2-3sec hold - Seated Thoracic Flexion and  Rotation with Arms Crossed  - 1 x daily - 7 x weekly - 3 sets - 10 reps - 2-3sec hold - Standing Hip Extension with Counter Support  - 1 x daily - 7 x weekly - 3 sets - 10 reps  ASSESSMENT:  CLINICAL IMPRESSION: Patient is a 41 y.o.  woman who was seen today for physical therapy evaluation and treatment for chronic low back pain, hx of LLE weakness and MS. Pt reports limitations in household activities and standing/gait tolerance. Demonstrates significant gait impairments as noted above with LOB requiring CGA and HHA from PT. Pt demonstrates notable reduction in lumbar mobility and B hip weakness more prominent L compared to R. Mild tenderness to palpation B Qls L>R, reports relief with gentle STM. 5xSTS performed in 16sec and with heavy UE support, which is indicative of fall risk in most populations (12-14sec). Pt currently at high risk of falling d/t aforementioned impairments, recommend skilled PT to maximize functional independence and reduce fall risk. Pt departs today's session in no acute distress, all voiced questions/concerns addressed appropriately from PT perspective.     OBJECTIVE IMPAIRMENTS Abnormal gait, decreased activity tolerance, decreased balance, decreased coordination, decreased endurance, decreased mobility, difficulty walking, decreased ROM, decreased strength, impaired flexibility, and pain.   ACTIVITY LIMITATIONS carrying, lifting, bending, standing, squatting, transfers, bathing, toileting, and dressing  PARTICIPATION LIMITATIONS: meal prep, cleaning, laundry, and community activity  PERSONAL FACTORS Time since onset of injury/illness/exacerbation and 3+ comorbidities: (hx DVT/PE, MS, depression/anxiety)  are also affecting patient's functional outcome.   REHAB POTENTIAL: Good  CLINICAL DECISION MAKING: Stable/uncomplicated  EVALUATION COMPLEXITY: Low   GOALS: Goals reviewed with patient? Yes  SHORT TERM GOALS: Target date: 12/01/2021  Pt will demonstrate appropriate understanding and performance of initially prescribed HEP in order to facilitate improved independence with management of symptoms.  Baseline: HEP provided on eval Goal status: INITIAL   LONG TERM GOALS: Target date:  12/22/2021  Pt will score 38% on modified Oswestry in order to demonstrate improved perception of functional status due to symptoms (MCID 6 pts) Baseline: 44% Goal status: INITIAL  2.  Pt will demonstrate at least 50%  lumbar AROM for extension in order to demonstrate improved tolerance to functional movement patterns.   Baseline: <25% Goal status: INITIAL  3.  Pt will demonstrate hip MMT of 4-/5 in L hip ER/IR in order to demonstrate improved hip strength for functional movements such as sit to stands and walking. Baseline: 3+/5 L hip ER/IR, 4-/5 R hip IR/ER Goal status: INITIAL  4. Pt will perform 5xSTS in <13 sec in order to demonstrate reduced fall risk and improved functional independence. (MCID of 2.3sec) Baseline: 16sec Goal status: INITIAL    PLAN: PT FREQUENCY: 1-2x/week  PT DURATION: 6 weeks  PLANNED INTERVENTIONS: Therapeutic exercises, Therapeutic activity, Neuromuscular re-education, Balance training, Gait training, Patient/Family education, Self Care, Joint mobilization, Joint manipulation, Stair training, Aquatic Therapy, Dry Needling, Spinal mobilization, Cryotherapy, Manual therapy, and Re-evaluation.  PLAN FOR NEXT SESSION: Progress ROM/strengthening exercises as able/appropriate, review HEP. Gait training - if pt brings rollator, assess appropriateness of use and adjust as able/appropriate.    Leeroy Cha PT, DPT 11/10/2021 4:30 PM   Discharge update: Leeroy Cha PT, DPT 01/06/2022 5:14 PM    Referring diagnosis?  G89.4 (ICD-10-CM) - Chronic pain syndrome  Treatment diagnosis? (if different than referring diagnosis) Other low back pain  Other abnormalities of gait and mobility  Muscle weakness (generalized)  What was this (referring dx) caused by? _0   Surgery _0  Fall _1  Ongoing issue _2  Arthritis _3  Other: ____________  Laterality: _4  Rt _5  Lt _6  Both  Check all possible CPT codes:  *CHOOSE 10 OR LESS*    _7  97110 (Therapeutic  Exercise)  _8  92507 (SLP Treatment)  _9  94473 (Neuro Re-ed)   _10  92526 (Swallowing Treatment)   _11  95844 (Gait Training)   _12  17127 (Cognitive Training, 1st 15 minutes) _13  97140 (Manual Therapy)   _14  97130 (Cognitive Training, each add'l 15 minutes)  _15  97164 (Re-evaluation)                              _16  Other, List CPT Code ____________  _17  87183 (Therapeutic Activities)     _18  67255 (Self Care)   _19  All codes above (97110 - 97535)  _20  97012 (Mechanical Traction)  _21  97014 (E-stim Unattended)  _22  97032 (E-stim manual)  _23  97033 (Ionto)  _24  97035 (Ultrasound) _25  97750 (Physical Performance Training) _26  00164 (Aquatic Therapy) _27  97016 (Vasopneumatic Device) _28  29037 (Paraffin) _29  95583 (Contrast Bath) _30  97597 (Wound Care 1st 20 sq cm) _31  97598 (Wound Care each add'l 20 sq cm) _32  97760 (Orthotic Fabrication, Fitting, Training Initial) _33  [id number] (Prosthetic Management and Training Initial) _34  [id number] (Orthotic or Prosthetic Training/ Modification Subsequent)

## 2021-11-15 ENCOUNTER — Ambulatory Visit: Payer: Medicare HMO | Admitting: Physical Therapy

## 2021-11-15 ENCOUNTER — Telehealth: Payer: Self-pay | Admitting: Physical Therapy

## 2021-11-15 NOTE — Telephone Encounter (Signed)
Attempted to contact patient re: no show - no answer, unable to leave voicemail as mail box is full.   Leeroy Cha PT, DPT 11/15/2021 4:50 PM

## 2021-11-17 ENCOUNTER — Ambulatory Visit: Payer: Medicare HMO | Admitting: Physical Therapy

## 2021-11-22 ENCOUNTER — Ambulatory Visit: Payer: Medicare HMO | Admitting: Physical Therapy

## 2021-11-22 ENCOUNTER — Telehealth: Payer: Self-pay | Admitting: Physical Therapy

## 2021-11-22 NOTE — Telephone Encounter (Signed)
Called pt re: no show for this afternoon's appointment - did not answer, unable to leave voicemail as mail box full.

## 2021-11-24 ENCOUNTER — Ambulatory Visit: Payer: Medicare HMO | Admitting: Physical Therapy

## 2021-11-29 ENCOUNTER — Ambulatory Visit: Payer: Medicare HMO | Admitting: Physical Therapy

## 2021-11-30 ENCOUNTER — Encounter: Payer: Self-pay | Admitting: Physical Therapy

## 2021-12-01 ENCOUNTER — Ambulatory Visit: Payer: Medicare HMO | Admitting: Physical Therapy

## 2021-12-06 ENCOUNTER — Encounter: Payer: Medicare HMO | Admitting: Physical Therapy

## 2021-12-07 ENCOUNTER — Inpatient Hospital Stay: Payer: Medicare HMO | Attending: Physician Assistant

## 2021-12-07 DIAGNOSIS — D649 Anemia, unspecified: Secondary | ICD-10-CM

## 2021-12-07 DIAGNOSIS — D619 Aplastic anemia, unspecified: Secondary | ICD-10-CM | POA: Diagnosis not present

## 2021-12-07 LAB — SAMPLE TO BLOOD BANK

## 2021-12-07 LAB — CBC WITH DIFFERENTIAL (CANCER CENTER ONLY)
Abs Immature Granulocytes: 0.04 10*3/uL (ref 0.00–0.07)
Basophils Absolute: 0 10*3/uL (ref 0.0–0.1)
Basophils Relative: 0 %
Eosinophils Absolute: 0.2 10*3/uL (ref 0.0–0.5)
Eosinophils Relative: 5 %
HCT: 39.4 % (ref 36.0–46.0)
Hemoglobin: 14 g/dL (ref 12.0–15.0)
Immature Granulocytes: 1 %
Lymphocytes Relative: 9 %
Lymphs Abs: 0.3 10*3/uL — ABNORMAL LOW (ref 0.7–4.0)
MCH: 33.7 pg (ref 26.0–34.0)
MCHC: 35.5 g/dL (ref 30.0–36.0)
MCV: 94.9 fL (ref 80.0–100.0)
Monocytes Absolute: 0.4 10*3/uL (ref 0.1–1.0)
Monocytes Relative: 13 %
Neutro Abs: 2.4 10*3/uL (ref 1.7–7.7)
Neutrophils Relative %: 72 %
Platelet Count: 227 10*3/uL (ref 150–400)
RBC: 4.15 MIL/uL (ref 3.87–5.11)
RDW: 13.2 % (ref 11.5–15.5)
WBC Count: 3.3 10*3/uL — ABNORMAL LOW (ref 4.0–10.5)
nRBC: 0 % (ref 0.0–0.2)

## 2021-12-08 ENCOUNTER — Ambulatory Visit: Payer: Medicare HMO | Admitting: Physical Therapy

## 2021-12-09 ENCOUNTER — Telehealth: Payer: Self-pay | Admitting: Neurology

## 2021-12-09 ENCOUNTER — Telehealth: Payer: Self-pay | Admitting: *Deleted

## 2021-12-09 NOTE — Telephone Encounter (Signed)
Completed PA on CMM/[employer] [id number] Will await determination

## 2021-12-09 NOTE — Telephone Encounter (Signed)
PA amphetamine-dextroamphetamine ER '20mg'$  submitted on CMM. Key: [id number]. Waiting on determination from [facility].   Pt ID: [name]: [id number], BVQXI: [id number], RXGRP: [id number]

## 2021-12-09 NOTE — Telephone Encounter (Signed)
PA Case: [id number], Status: Approved, Coverage Starts on: 03/07/2021 12:00:00 AM, Coverage Ends on: 03/07/2023 12:00:00 AM. Questions? Contact [phone].

## 2021-12-15 ENCOUNTER — Ambulatory Visit: Payer: Medicare HMO | Admitting: Physical Therapy

## 2021-12-22 ENCOUNTER — Ambulatory Visit: Payer: Medicare HMO | Admitting: Physical Therapy

## 2021-12-25 ENCOUNTER — Other Ambulatory Visit: Payer: Self-pay | Admitting: Neurology

## 2021-12-27 ENCOUNTER — Other Ambulatory Visit: Payer: Self-pay | Admitting: Neurology

## 2021-12-28 ENCOUNTER — Telehealth: Payer: Self-pay | Admitting: *Deleted

## 2021-12-28 ENCOUNTER — Telehealth: Payer: Self-pay | Admitting: Neurology

## 2021-12-28 NOTE — Telephone Encounter (Signed)
We also received a fax with further questions. This is completed. Pending MD signature and then will fax back in.

## 2021-12-28 NOTE — Telephone Encounter (Signed)
Dawn Peters is calling from [facility]. Said he is calling about prior authorization for medication tiZANidine (ZANAFLEX) 4 MG tablet. Dawn Peters is requesting a call back from the nurse.

## 2021-12-28 NOTE — Telephone Encounter (Signed)
Received fax from [facility] that [place] approved until 03/07/23.

## 2021-12-28 NOTE — Telephone Encounter (Signed)
Faxed printed/signed PA tizanidine to [facility] at [phone]. Received fax confirmation, waiting on determination.

## 2022-01-04 ENCOUNTER — Telehealth: Payer: Self-pay | Admitting: Neurology

## 2022-01-04 NOTE — Telephone Encounter (Signed)
Pt is calling. Stated she need a new prescription for medication Ozanimod HCl (ZEPOSIA) 0.92 MG CAPS. Prescrition should be sent to [facility]

## 2022-01-12 ENCOUNTER — Encounter: Payer: Self-pay | Admitting: Neurology

## 2022-01-12 ENCOUNTER — Ambulatory Visit (INDEPENDENT_AMBULATORY_CARE_PROVIDER_SITE_OTHER): Payer: Medicare HMO | Admitting: Neurology

## 2022-01-12 VITALS — BP 122/85 | HR 80 | Ht 66.0 in | Wt 228.5 lb

## 2022-01-12 DIAGNOSIS — N183 Chronic kidney disease, stage 3 unspecified: Secondary | ICD-10-CM | POA: Diagnosis not present

## 2022-01-12 DIAGNOSIS — M21372 Foot drop, left foot: Secondary | ICD-10-CM

## 2022-01-12 DIAGNOSIS — R5383 Other fatigue: Secondary | ICD-10-CM | POA: Diagnosis not present

## 2022-01-12 DIAGNOSIS — G35 Multiple sclerosis: Secondary | ICD-10-CM

## 2022-01-12 DIAGNOSIS — R269 Unspecified abnormalities of gait and mobility: Secondary | ICD-10-CM

## 2022-01-12 DIAGNOSIS — N399 Disorder of urinary system, unspecified: Secondary | ICD-10-CM

## 2022-01-12 MED ORDER — BACLOFEN 10 MG PO TABS: ORAL_TABLET | ORAL | 3 refills | Status: DC

## 2022-01-12 MED ORDER — AMPHETAMINE-DEXTROAMPHET ER 20 MG PO CP24: ORAL_CAPSULE | ORAL | 0 refills | Status: DC

## 2022-01-12 MED ORDER — PREDNISONE 50 MG PO TABS: ORAL_TABLET | ORAL | 0 refills | Status: DC

## 2022-01-12 MED ORDER — TIZANIDINE HCL 4 MG PO TABS
4.0000 mg | ORAL_TABLET | Freq: Every day | ORAL | 3 refills | Status: DC
Start: 2022-01-12 — End: 2022-10-10

## 2022-01-12 NOTE — Progress Notes (Signed)
[facility]  PATIENT: Dawn Peters DOB: [birth year]  REFERRING DOCTOR OR PCP:  Dr. Annitta Needs SOURCE: Patient, records in EMR, lab results and radiology reports in EMR, MRI images reviewed on PACS.  _________________________________   HISTORICAL  CHIEF COMPLAINT:  Chief Complaint  Patient presents with   Follow-up    Pt in room #11 with her husband. Pt here today for f/u on Lemtrada for her MS.     HISTORY OF PRESENT ILLNESS:  Dawn Peters is a 41 y.o. woman who was diagnosed with relapsing remitting MS in 2000.     Update 01/12/2022 She has had a few falls since the last visit.    She reports pain in both legs and spasticity in legs, left > right.    She can go 50-60 feet with a cane.  She rarely uses a walker instead.   She uses a wheelchair for longer distance.      She was on Lemtrada for her MS.   We had started Zeposia but stopped due to very low lymphocytes.   She had anemia and needed transfusions ( normocytic, normochromic and hypoproliferative component anemia not autoimmune).         Steroids help her strength.   Her last one was about 4-5 months ago.      Dysesthesias are better on  gabapentin 800 mg po tid.      She has urinary urgency and hesitancy.  She is on tamsulosin.   She sees urology.     She has stage 3 CKD and sees Nephrology at [facility] is stable.    Her fatigue is helped by Adderall.  She sleeps well most nights.  Depression and anxiety are better with the citalopram and anxiety is better with BuSpar .   She is taking vitamin D for her deficiency.  She has 2-3 migraines a month, helped by sumatriptan.   She continues to report back pain and left leg pain that radiates.  She was seeing  [facility] annd would like a referral to a differntn provider.     MS history:   She was diagnosed with MS in 2000 after presenting with gait difficulties, numbness and headaches. An MRI  of the brain was consistent with MS. She was then started on Betaseron. She had difficulty tolerating Betaseron and at some point switched to Novantrone. She took Novantrone every 3 months for a year or so. A little later, she was placed on Tysabri and she stayed on it for a couple of years. However, she was JCV-positive and switched off. She did well on Tysabri. She had been on Tecfidera since 2015. Her MRI from June 2015 showed that there had been no changes compared to an MRI from 2014.   However, she had an exacerbation March 2017.   The 08/07/2013 MRI of the brain shows multiple T2/FLAIR hyperintense foci located in the cerebellum, middle cerebellar peduncles, pons and in the periventricular white matter of the hemispheres in a pattern and configuration consistent with chronic demyelinating plaque associated with MS. There were no acute findings. There was no change compared to the previous MRI from 01/16/2013 that was also reviewed. She switched to [place] in July,2017/Aug 2018.   Due to progression she was shortly on Ocrevus in 2020 and then switched to Zeposia January 2021. Due to persistent low lymphocytes (even on qod dosing), Zeposia was dicontinued 07/01/2021   Physical Exam She is  a well-developed well-nourished woman in no acute distress.  The head is normocephalic and atraumatic.  Sclera are anicteric.  Visible skin appears normal.  The neck has a good range of motion. She is alert and fully oriented with fluent speech and good attention, knowledge and memory.  Extraocular muscles are intact.  Facial strength is normal.  She appears to have normal strength in the arms.  Rapid alternating movements and finger-nose-finger are performed well.   IMAGING:  MRI cervical spine 08/31/2020 showed ubtle T2 hyperintense signal within the cord and there were no acute findings.  MRI brain 08/31/2020 showed Unchanged distribution of white matter lesions in a pattern compatible with multiple sclerosis. No  active demyelinating lesions.  REVIEW OF SYSTEMS: Constitutional: No fevers, chills, sweats, or change in appetite.   She has fatigue.  Some insomnia. Eyes: No visual changes, double vision, eye pain Ear, nose and throat: No hearing loss, ear pain, nasal congestion, sore throat Cardiovascular: No chest pain, palpitations Respiratory:  No shortness of breath at rest or with exertion.   No wheezes GastrointestinaI: No nausea, vomiting, diarrhea, abdominal pain, fecal incontinence Genitourinary:  No dysuria.   She has hesitancy. Sometimes she has difficulty emptying completely Flomax has helped urinary hesitancy. 1 x nocturia. Musculoskeletal: She notes pain in the left leg and some back pain Integumentary: No rash, pruritus, skin lesions Neurological: as above Psychiatric: Mild depression at this time.  Moderate anxiety Endocrine: No palpitations, diaphoresis, change in appetite, change in weigh or increased thirst Hematologic/Lymphatic:  No anemia, purpura, petechiae. Allergic/Immunologic: No itchy/runny eyes, nasal congestion, recent allergic reactions, rashes  ALLERGIES: Allergies  Allergen Reactions   Ace Inhibitors Swelling   Amoxicillin Itching    Did it involve swelling of the face/tongue/throat, SOB, or low BP? Yes Did it involve sudden or severe rash/hives, skin peeling, or any reaction on the inside of your mouth or nose? No Did you need to seek medical attention at a hospital or doctor's office? No When did it last happen?      unknown  If all above answers are "NO", may proceed with cephalosporin use.;   Lisinopril Swelling    Lower lip swelling    HOME MEDICATIONS:  Current Outpatient Medications:    acetaminophen (TYLENOL) 500 MG tablet, Take 1,000 mg by mouth every 6 (six) hours as needed for moderate pain or headache., Disp: , Rfl:    amantadine (SYMMETREL) 100 MG capsule, Take 100 mg by mouth 2 (two) times daily., Disp: , Rfl:    amitriptyline (ELAVIL) 25 MG  tablet, TAKE 1 TO 2 TABLETS(25 TO 50 MG) BY MOUTH AT BEDTIME, Disp: 180 tablet, Rfl: 0   amLODipine (NORVASC) 5 MG tablet, Take 1 tablet (5 mg total) by mouth daily. (Patient taking differently: Take 5 mg by mouth daily after breakfast.), Disp: 90 tablet, Rfl: 0   atorvastatin (LIPITOR) 10 MG tablet, Take 10 mg by mouth daily., Disp: , Rfl:    busPIRone (BUSPAR) 15 MG tablet, TAKE ONE TABLET BY MOUTH TWICE DAILY @ 9AM & 5PM, Disp: 180 tablet, Rfl: 1   citalopram (CELEXA) 20 MG tablet, Take 1 tablet (20 mg total) by mouth daily., Disp: 90 tablet, Rfl: 1   dalfampridine 10 MG TB12, TAKE 1 TABLET BY MOUTH TWICE DAILY, Disp: 60 tablet, Rfl: 11   diphenhydrAMINE (BENADRYL) 25 mg capsule, Take 25 mg by mouth every 6 (six) hours as needed for itching., Disp: , Rfl:    ELIQUIS 5 MG TABS tablet, TAKE ONE TABLET  BY MOUTH TWICE DAILY @ 9AM & 9PM, Disp: 60 tablet, Rfl: 15   folic acid (FOLVITE) 1 MG tablet, Take 1 tablet (1 mg total) by mouth daily., Disp: 30 tablet, Rfl: 1   gabapentin (NEURONTIN) 800 MG tablet, TAKE ONE TABLET BY MOUTH FOUR TIMES DAILY @ 9AM-1PM-5PM-9PM, Disp: 360 tablet, Rfl: 1   JARDIANCE 10 MG TABS tablet, Take 10 mg by mouth daily., Disp: , Rfl:    LINZESS 72 MCG capsule, Take 72 mcg by mouth daily as needed (constipation)., Disp: , Rfl:    metoprolol succinate (TOPROL-XL) 50 MG 24 hr tablet, Take 50 mg by mouth daily., Disp: , Rfl:    Multiple Vitamin (MULTIVITAMIN WITH MINERALS) TABS tablet, Take 1 tablet by mouth daily., Disp: 30 tablet, Rfl: 0   norethindrone (MICRONOR) 0.35 MG tablet, Take 1 tablet (0.35 mg total) by mouth daily., Disp: 1 Package, Rfl: 11   omeprazole (PRILOSEC) 40 MG capsule, Take 40 mg by mouth daily., Disp: , Rfl:    SUMAtriptan (IMITREX) 100 MG tablet, TAKE 1 TABLET BY MOUTH 1 TIME FOR UP TO 1 DOSE AS NEEDED FOR MIGRAINE. MAY REPEAT IN 2 HOURS IF HEADACHE PERSISTS OR RECURS, Disp: 10 tablet, Rfl: 5   tamsulosin (FLOMAX) 0.4 MG CAPS capsule, TAKE 1 CAPSULE(0.4 MG)  BY MOUTH DAILY, Disp: 30 capsule, Rfl: 11   vitamin B-12 (CYANOCOBALAMIN) 1000 MCG tablet, Take 1 tablet (1,000 mcg total) by mouth daily., Disp: 20 tablet, Rfl: 0   amphetamine-dextroamphetamine (ADDERALL XR) 20 MG 24 hr capsule, TAKE ONE CAPSULE DAILY AT 9AM (VIAL), Disp: 30 capsule, Rfl: 0   baclofen (LIORESAL) 10 MG tablet, TAKE 1 TABLET(10 MG) BY MOUTH FOUR TIMES DAILY, Disp: 360 tablet, Rfl: 3   predniSONE (DELTASONE) 50 MG tablet, 12 pills (600 mg) po daily x 3 days for MS exacerbation, Disp: 36 tablet, Rfl: 0   tiZANidine (ZANAFLEX) 4 MG tablet, Take 1 tablet (4 mg total) by mouth at bedtime., Disp: 360 tablet, Rfl: 3 No current facility-administered medications for this visit.  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   left lower extremity-- treated with eliquis   Gait disturbance    due to MS   GERD (gastroesophageal reflux disease)    watch diet   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/p  THA   History of DVT of lower extremity 2007   History of encephalopathy 28/7867   acute metabolic encephalopathy secondary to MS,  resolved   History of MRSA infection 2004   right hip infection post THA   History of pulmonary embolus (PE) 2005   Hydronephrosis, left    w/ acute kidney injury 02/ 2019   Hypertension    Left-sided weakness    due to MS   Migraines    MS (multiple sclerosis) [facility]) neurologist-  dr Renne Musca   dx 2000--   Muscle  spasticity  Neurogenic bladder    due to MS   Pulmonary embolism, bilateral ([place]) 09/17/2017   treated w/ eliquis   Strains to urinate    Urgency of urination     PAST SURGICAL HISTORY: Past Surgical History:  Procedure Laterality Date   CYSTOSCOPY WITH RETROGRADE PYELOGRAM, URETEROSCOPY AND STENT PLACEMENT Bilateral 11/29/2017   Procedure: BILATERAL  RETROGRADE PYELOGRAM, LEFT DIAGNOSIC URETEROSCOPY AND STENT LEFT PLACEMENT;  Surgeon: Ardis Hughs, MD;  Location: [facility];  Service: Urology;  Laterality: Bilateral;   HIP ARTHROSCOPY Left 07-05-2013   dr pill '@[facility]'$    iliopsosas release, synovectomy   REVISION TOTAL HIP ARTHROPLASTY Right 03-28-2003    dr Alvan Dame '@[facility]'$    TOTAL HIP ARTHROPLASTY Bilateral left 11-05-2002  dr Alvan Dame '@[facility]'$ ;   right 06/ 2004  '@[facility]'$     FAMILY HISTORY: Family History  Problem Relation Age of Onset   Healthy Mother    Healthy Father    Colon cancer Paternal Grandmother    Breast cancer Paternal Grandmother 65   Hypertension Other    Diabetes Other     PHYSICAL EXAM   Today's Vitals   01/12/22 1509  BP: 122/85  Pulse: 80  Weight: 228 lb 8 oz (103.6 kg)  Height: '5\' 6"'$  (1.676 m)   Body mass index is 36.88 kg/m.    General: The patient is well-developed and well-nourished and in no acute distress.   Neurologic Exam   Mental status: The patient is alert and oriented x 3 at the time of the examination. The patient has apparent normal recent and remote memory, with mildly reduced attention span and concentration ability.   Speech is normal.   Cranial nerves: Extraocular movements are full.  Facial strength and sensation is normal.  Trapezius strength is normal.. No obvious hearing deficits are noted.   Motor:  Muscle bulk is normal.  She has increased muscle tone in the left leg.  Strength is normal in the arms and right leg and 3/5 left hip flexion,  4-/5 in the distal left leg and 4+/5 quads   Sensory: Touch and vibration  sensation is normal in the arms and legs.   Coordination: She performs finger-nose-finger well.  She has reduced heel-to-shin, left worse than right.      Gait and station: She is able to stand with unilateral support..  She has left foot drop.   She cannot tandem walk.     Reflexes: Deep tendon reflexes are increased in the legs, left greater than right.  She has crossed abductor responses, worse on the left.  She has nonsustained clonus in the left ankle      DIAGNOSTIC DATA (LABS, IMAGING, TESTING) - I reviewed patient records, labs, notes, testing and imaging myself where available.  Lab Results  Component Value Date   WBC 3.3 (L) 12/07/2021   HGB 14.0 12/07/2021   HCT 39.4 12/07/2021   MCV 94.9 12/07/2021   PLT 227 12/07/2021      Component Value Date/Time   NA 142 07/12/2021 0159   NA 144 06/21/2017 1626   K 3.8 07/12/2021 0159   CL 108 07/12/2021 0159   CO2 25 07/12/2021 0159   GLUCOSE 136 (H) 07/12/2021 0159   BUN 26 (H) 07/12/2021 0159   BUN 16 06/21/2017 1626   CREATININE 1.45 (H) 07/12/2021 0159   CREATININE 1.20 (H) 06/07/2021 1232   CREATININE 0.85 07/03/2014 0959   CALCIUM 8.9 07/12/2021 0159   PROT 6.9 07/12/2021 0159   PROT 7.5 01/11/2018  1542   ALBUMIN 3.7 07/12/2021 0159   ALBUMIN 4.2 01/11/2018 1542   AST 17 07/12/2021 0159   AST 12 (L) 06/07/2021 1232   ALT 27 07/12/2021 0159   ALT 17 06/07/2021 1232   ALKPHOS 100 07/12/2021 0159   BILITOT 0.7 07/12/2021 0159   BILITOT 0.4 06/07/2021 1232   GFRNONAA 46 (L) 07/12/2021 0159   GFRNONAA 58 (L) 06/07/2021 1232   GFRNONAA 88 07/03/2014 0959   GFRAA 45 (L) 09/01/2019 0503   GFRAA >89 07/03/2014 0959   Lab Results  Component Value Date   CHOL 228 (H) 04/20/2017   HDL 77 04/20/2017   LDLCALC 142 (H) 04/20/2017   TRIG 46 04/20/2017   CHOLHDL 3.0 04/20/2017   Lab Results  Component Value Date   HGBA1C 5.3 04/20/2017   Lab Results  Component Value Date   VITAMINB12 203 04/20/2021   Lab  Results  Component Value Date   TSH 2.140 04/19/2021      ASSESSMENT AND PLAN    1. Multiple sclerosis ([place])   2. Left foot drop   3. Other fatigue   4. Stage 3 chronic kidney disease, unspecified whether stage 3a or 3b CKD ([place])   5. Gait disturbance   6. Urinary disorder      1.   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   Will consider Aubagio after lymphocytes increase as should have less effect on cell counts.   2.   I'll do 3 days of high dose prednisone due to her increased weakness --- a fluctuation in symptoms is more likely than relapse. 3.    Renew Adderall. 4.   Continue seeing hematology for anemia 5.    She will return to see me in 4 months or sooner if there are new or worsening neurologic symptoms.  40-minute office visit with the majority of the time spent face-to-face for history and physical, discussion/counseling and decision-making.  Additional time with record review and documentation.     Eloyse Causey A. Felecia Shelling, MD, PhD 94/09/6544, 5:03 PM Certified in Neurology, Clinical Neurophysiology, Sleep Medicine, Pain Medicine and Neuroimaging  [facility] [address], [place], [place] [address] [phone] ]

## 2022-01-19 ENCOUNTER — Other Ambulatory Visit: Payer: Self-pay | Admitting: Neurology

## 2022-01-20 ENCOUNTER — Inpatient Hospital Stay: Payer: Medicare HMO | Attending: Physician Assistant

## 2022-01-20 DIAGNOSIS — D619 Aplastic anemia, unspecified: Secondary | ICD-10-CM | POA: Insufficient documentation

## 2022-01-20 DIAGNOSIS — D649 Anemia, unspecified: Secondary | ICD-10-CM

## 2022-01-20 LAB — CBC WITH DIFFERENTIAL (CANCER CENTER ONLY)
Abs Immature Granulocytes: 0.06 10*3/uL (ref 0.00–0.07)
Basophils Absolute: 0 10*3/uL (ref 0.0–0.1)
Basophils Relative: 0 %
Eosinophils Absolute: 0 10*3/uL (ref 0.0–0.5)
Eosinophils Relative: 0 %
HCT: 43.6 % (ref 36.0–46.0)
Hemoglobin: 15.4 g/dL — ABNORMAL HIGH (ref 12.0–15.0)
Immature Granulocytes: 1 %
Lymphocytes Relative: 5 %
Lymphs Abs: 0.4 10*3/uL — ABNORMAL LOW (ref 0.7–4.0)
MCH: 33.2 pg (ref 26.0–34.0)
MCHC: 35.3 g/dL (ref 30.0–36.0)
MCV: 94 fL (ref 80.0–100.0)
Monocytes Absolute: 0.1 10*3/uL (ref 0.1–1.0)
Monocytes Relative: 2 %
Neutro Abs: 6 10*3/uL (ref 1.7–7.7)
Neutrophils Relative %: 92 %
Platelet Count: 237 10*3/uL (ref 150–400)
RBC: 4.64 MIL/uL (ref 3.87–5.11)
RDW: 13.5 % (ref 11.5–15.5)
WBC Count: 6.6 10*3/uL (ref 4.0–10.5)
nRBC: 0 % (ref 0.0–0.2)

## 2022-01-20 LAB — SAMPLE TO BLOOD BANK

## 2022-01-25 ENCOUNTER — Ambulatory Visit: Payer: Medicare HMO | Admitting: Neurology

## 2022-02-21 ENCOUNTER — Other Ambulatory Visit: Payer: Self-pay | Admitting: Neurology

## 2022-02-24 ENCOUNTER — Other Ambulatory Visit: Payer: Self-pay | Admitting: Neurology

## 2022-03-21 ENCOUNTER — Telehealth: Payer: Self-pay | Admitting: Neurology

## 2022-03-21 NOTE — Telephone Encounter (Signed)
Called pt back at [phone]. Got automated message that call could not be completed and to try again later.  Tried [phone]. Got automated message that call could not be completed as dialed.

## 2022-03-21 NOTE — Telephone Encounter (Signed)
Pt states she is feeling as if she needs to be put back on a MS medication, please call.

## 2022-03-22 NOTE — Telephone Encounter (Signed)
Tried [phone]. Got automated message that call could not be completed. Tried [phone]. Got automated that call could not be completed. I sent mychart since unable to reach by phone.

## 2022-03-28 NOTE — Telephone Encounter (Signed)
Pt is returning a calling. Requesting a call back from nurse.

## 2022-03-28 NOTE — Telephone Encounter (Signed)
Dawn Peters, can you help make sure we get a copy of her most recent labs for Dr. Felecia Shelling to review? Thank you!

## 2022-03-28 NOTE — Telephone Encounter (Signed)
Called pt at [phone]. Dr. Felecia Shelling took her off of Zeposia. She feels she needs to go back on DMT.  She had labs completed through [facility] 02/2022. She is going to send a copy for Dr. Felecia Shelling to review.   Per last note from Dr. 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 Will consider Aubagio after lymphocytes increase as should have less effect on cell counts"

## 2022-03-29 NOTE — Telephone Encounter (Signed)
Hilda Blades- have we received these results?

## 2022-03-30 NOTE — Telephone Encounter (Signed)
Dawn Peters, did we receive?

## 2022-03-31 ENCOUNTER — Telehealth: Payer: Self-pay | Admitting: Oncology

## 2022-03-31 ENCOUNTER — Telehealth: Payer: Self-pay

## 2022-03-31 ENCOUNTER — Telehealth: Payer: Self-pay | Admitting: Physician Assistant

## 2022-03-31 ENCOUNTER — Inpatient Hospital Stay: Payer: Medicare HMO | Attending: Physician Assistant

## 2022-03-31 DIAGNOSIS — D649 Anemia, unspecified: Secondary | ICD-10-CM

## 2022-03-31 DIAGNOSIS — D619 Aplastic anemia, unspecified: Secondary | ICD-10-CM | POA: Diagnosis present

## 2022-03-31 LAB — CBC WITH DIFFERENTIAL (CANCER CENTER ONLY)
Abs Immature Granulocytes: 0.14 10*3/uL — ABNORMAL HIGH (ref 0.00–0.07)
Basophils Absolute: 0 10*3/uL (ref 0.0–0.1)
Basophils Relative: 0 %
Eosinophils Absolute: 0.1 10*3/uL (ref 0.0–0.5)
Eosinophils Relative: 3 %
HCT: 42.9 % (ref 36.0–46.0)
Hemoglobin: 15.2 g/dL — ABNORMAL HIGH (ref 12.0–15.0)
Immature Granulocytes: 3 %
Lymphocytes Relative: 17 %
Lymphs Abs: 0.8 10*3/uL (ref 0.7–4.0)
MCH: 33.7 pg (ref 26.0–34.0)
MCHC: 35.4 g/dL (ref 30.0–36.0)
MCV: 95.1 fL (ref 80.0–100.0)
Monocytes Absolute: 0.3 10*3/uL (ref 0.1–1.0)
Monocytes Relative: 7 %
Neutro Abs: 3.3 10*3/uL (ref 1.7–7.7)
Neutrophils Relative %: 70 %
Platelet Count: 240 10*3/uL (ref 150–400)
RBC: 4.51 MIL/uL (ref 3.87–5.11)
RDW: 13.7 % (ref 11.5–15.5)
WBC Count: 4.6 10*3/uL (ref 4.0–10.5)
nRBC: 0 % (ref 0.0–0.2)

## 2022-03-31 LAB — CMP (CANCER CENTER ONLY)
ALT: 11 U/L (ref 0–44)
AST: 13 U/L — ABNORMAL LOW (ref 15–41)
Albumin: 3.7 g/dL (ref 3.5–5.0)
Alkaline Phosphatase: 102 U/L (ref 38–126)
Anion gap: 6 (ref 5–15)
BUN: 18 mg/dL (ref 6–20)
CO2: 29 mmol/L (ref 22–32)
Calcium: 9.2 mg/dL (ref 8.9–10.3)
Chloride: 104 mmol/L (ref 98–111)
Creatinine: 1.51 mg/dL — ABNORMAL HIGH (ref 0.44–1.00)
GFR, Estimated: 44 mL/min — ABNORMAL LOW (ref >60)
Glucose, Bld: 134 mg/dL — ABNORMAL HIGH (ref 70–99)
Potassium: 3.2 mmol/L — ABNORMAL LOW (ref 3.5–5.1)
Sodium: 139 mmol/L (ref 135–145)
Total Bilirubin: 0.6 mg/dL (ref 0.3–1.2)
Total Protein: 6.5 g/dL (ref 6.5–8.1)

## 2022-03-31 NOTE — Telephone Encounter (Signed)
This is a former [place] pt without a Dr assigned.  She came in for a lab only appt.  Her Hgb was 15.2

## 2022-03-31 NOTE — Telephone Encounter (Signed)
Called patient per 1/25 secure chat to get scheduled with new provider per Dr. Alen Blew transition. Patient scheduled and notified to not miss next appointment.

## 2022-03-31 NOTE — Telephone Encounter (Signed)
Called patient's son after attempting to call patient directly. Patient phone not ringing. Left voicemail for patient's son for the patient to call me back about scheduling with new MD.

## 2022-03-31 NOTE — Telephone Encounter (Signed)
Dr. Felecia Shelling, this is what pt concerns were from the other day:   Called pt at [phone]. Dr. Felecia Shelling took her off of Zeposia. She feels she needs to go back on DMT.  She had labs completed through [facility] 02/2022. She is going to send a copy for Dr. Felecia Shelling to review.    Per last note from Dr. 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 Will consider Aubagio after lymphocytes increase as should have less effect on cell counts"

## 2022-04-01 ENCOUNTER — Other Ambulatory Visit: Payer: Self-pay | Admitting: Neurology

## 2022-04-01 DIAGNOSIS — Z79899 Other long term (current) drug therapy: Secondary | ICD-10-CM

## 2022-04-01 DIAGNOSIS — G35 Multiple sclerosis: Secondary | ICD-10-CM

## 2022-04-05 NOTE — Telephone Encounter (Signed)
Called pt at [phone]. Relayed mychart message to her. She will get TB test this week. Aware we will call once back to let her know for sure if she is cleared to re-start medication.

## 2022-04-06 ENCOUNTER — Inpatient Hospital Stay: Payer: Medicare HMO | Admitting: Physician Assistant

## 2022-04-07 ENCOUNTER — Other Ambulatory Visit: Payer: Self-pay

## 2022-04-07 ENCOUNTER — Emergency Department (HOSPITAL_COMMUNITY): Payer: Medicare HMO

## 2022-04-07 ENCOUNTER — Emergency Department (HOSPITAL_COMMUNITY)
Admission: EM | Admit: 2022-04-07 | Discharge: 2022-04-07 | Disposition: A | Discharge status: Home / Self Care | Payer: Medicare HMO | Attending: Emergency Medicine | Admitting: Emergency Medicine

## 2022-04-07 DIAGNOSIS — Z1152 Encounter for screening for COVID-19: Secondary | ICD-10-CM | POA: Insufficient documentation

## 2022-04-07 DIAGNOSIS — Z79899 Other long term (current) drug therapy: Secondary | ICD-10-CM | POA: Diagnosis not present

## 2022-04-07 DIAGNOSIS — R000 Tachycardia, unspecified: Secondary | ICD-10-CM | POA: Diagnosis not present

## 2022-04-07 DIAGNOSIS — J101 Influenza due to other identified influenza virus with other respiratory manifestations: Secondary | ICD-10-CM | POA: Insufficient documentation

## 2022-04-07 DIAGNOSIS — R0981 Nasal congestion: Secondary | ICD-10-CM | POA: Diagnosis present

## 2022-04-07 DIAGNOSIS — Z7901 Long term (current) use of anticoagulants: Secondary | ICD-10-CM | POA: Insufficient documentation

## 2022-04-07 DIAGNOSIS — Z7984 Long term (current) use of oral hypoglycemic drugs: Secondary | ICD-10-CM | POA: Insufficient documentation

## 2022-04-07 LAB — RESP PANEL BY RT-PCR (RSV, FLU A&B, COVID)  RVPGX2
Influenza A by PCR: POSITIVE — AB
Influenza B by PCR: NEGATIVE
Resp Syncytial Virus by PCR: NEGATIVE
SARS Coronavirus 2 by RT PCR: NEGATIVE

## 2022-04-07 MED ORDER — HYDROCODONE BIT-HOMATROP MBR 5-1.5 MG/5ML PO SOLN: 5.0000 mL | Freq: Four times a day (QID) | ORAL | 0 refills | Status: DC | PRN

## 2022-04-07 MED ORDER — OSELTAMIVIR PHOSPHATE 75 MG PO CAPS: 75.0000 mg | ORAL_CAPSULE | Freq: Two times a day (BID) | ORAL | 0 refills | Status: DC

## 2022-04-07 NOTE — ED Triage Notes (Signed)
Patient c/o cough and SOB x 3 days. Pt report she got expose with someone with flu at church. Pt denies N/V. Pt a/ox4.

## 2022-04-07 NOTE — Discharge Instructions (Addendum)
Your workup today showed you have influenza.  Chest x-ray without evidence of pneumonia.  EKG without concerning findings.  I have sent cough syrup and Tamiflu into the pharmacy for you.  For any concerning symptoms please return to the emergency department otherwise follow-up with your primary care provider.  Drink plenty of fluids to keep yourself hydrated.  Take Tylenol Motrin as ED2 for fever control and bodyaches.  You can do a sinus rinse given your congestion.

## 2022-04-07 NOTE — ED Provider Notes (Signed)
[place] EMERGENCY DEPARTMENT AT [facility] Provider Note   CSN: [id number] Arrival date & time: 04/07/22  2029     History  Chief Complaint  Patient presents with   flu like symptoms    Dawn Peters is a 42 y.o. female.  42 year old female presents today for evaluation of URI symptoms including congestion, postnasal drainage, body aches.  She states that she was recently told that her nephew's were diagnosed with flu and she has been around them.  She denies fever, difficulty swallowing.  She does endorse that yesterday she started developing chest discomfort with coughing spells.  No chest pain at rest.  Without dyspnea.  The history is provided by the patient. No language interpreter was used.       Home Medications Prior to Admission medications   Medication Sig Start Date End Date Taking? Authorizing Provider  acetaminophen (TYLENOL) 500 MG tablet Take 1,000 mg by mouth every 6 (six) hours as needed for moderate pain or headache.    [provider]  amantadine (SYMMETREL) 100 MG capsule Take 100 mg by mouth 2 (two) times daily. 09/27/20   [provider]  amitriptyline (ELAVIL) 25 MG tablet TAKE 1 TO 2 TABLETS(25 TO 50 MG) BY MOUTH AT BEDTIME 10/18/21   Sater, Nanine Means, MD  amLODipine (NORVASC) 5 MG tablet Take 1 tablet (5 mg total) by mouth daily. Patient taking differently: Take 5 mg by mouth daily after breakfast. 05/21/17   Arrien, Jimmy Picket, MD  amphetamine-dextroamphetamine (ADDERALL XR) 20 MG 24 hr capsule TAKE ONE CAPSULE DAILY AT 9AM (VIAL) 01/12/22   Sater, Nanine Means, MD  atorvastatin (LIPITOR) 10 MG tablet Take 10 mg by mouth daily. 12/24/20   [provider]  baclofen (LIORESAL) 10 MG tablet TAKE ONE TABLET BY MOUTH FOUR TIMES DAILY @ 9AM, 1PM, 5PM, & 9PM 02/24/22   Sater, Nanine Means, MD  busPIRone (BUSPAR) 15 MG tablet TAKE ONE TABLET BY MOUTH TWICE DAILY @ 9AM & 5PM 10/26/21   Sater, Nanine Means, MD  citalopram  (CELEXA) 20 MG tablet TAKE ONE TABLET BY MOUTH DAILY AT 9AM 02/24/22   Sater, Nanine Means, MD  dalfampridine 10 MG TB12 TAKE 1 TABLET BY MOUTH TWICE DAILY 10/07/21   Sater, Nanine Means, MD  diphenhydrAMINE (BENADRYL) 25 mg capsule Take 25 mg by mouth every 6 (six) hours as needed for itching.    [provider]  ELIQUIS 5 MG TABS tablet TAKE ONE TABLET BY MOUTH TWICE DAILY @ 9AM & 9PM 12/27/21   Sater, Nanine Means, MD  folic acid (FOLVITE) 1 MG tablet Take 1 tablet (1 mg total) by mouth daily. 04/19/21   Regalado, Belkys A, MD  gabapentin (NEURONTIN) 800 MG tablet TAKE ONE TABLET BY MOUTH FOUR TIMES DAILY @ 9AM-1PM-5PM-9PM 10/26/21   Sater, Nanine Means, MD  JARDIANCE 10 MG TABS tablet Take 10 mg by mouth daily. 03/14/21   [provider]  LINZESS 72 MCG capsule Take 72 mcg by mouth daily as needed (constipation). 02/18/21   [provider]  metoprolol succinate (TOPROL-XL) 50 MG 24 hr tablet Take 50 mg by mouth daily. 09/27/20   [provider]  Multiple Vitamin (MULTIVITAMIN WITH MINERALS) TABS tablet Take 1 tablet by mouth daily. 09/20/17   Kayleen Memos, DO  norethindrone (MICRONOR) 0.35 MG tablet Take 1 tablet (0.35 mg total) by mouth daily. 11/19/18   Clarnce Flock, MD  omeprazole (PRILOSEC) 40 MG capsule Take 40 mg by mouth daily.  09/27/20   [provider]  predniSONE (DELTASONE) 50 MG tablet 12 pills (600 mg) po daily x 3 days for MS exacerbation 01/12/22   Sater, Nanine Means, MD  SUMAtriptan (IMITREX) 100 MG tablet TAKE ONE TABLET BY MOUTH ONE TIME FOR UP TO ONE DOSE AS NEEDED FOR MIGRAINE. MAY REPEAT IN 2 HOURS IF HEADACHE PRESISTS OR RECURS. (ORIG) 01/19/22   Sater, Nanine Means, MD  tamsulosin (FLOMAX) 0.4 MG CAPS capsule TAKE 1 CAPSULE(0.4 MG) BY MOUTH DAILY 10/18/21   Sater, Nanine Means, MD  tiZANidine (ZANAFLEX) 4 MG tablet Take 1 tablet (4 mg total) by mouth at bedtime. 01/12/22   Sater, Nanine Means, MD  vitamin B-12 (CYANOCOBALAMIN) 1000 MCG tablet Take 1 tablet  (1,000 mcg total) by mouth daily. 04/20/21   Regalado, Belkys A, MD      Allergies    Ace inhibitors, Amoxicillin, and Lisinopril    Review of Systems   Review of Systems  Constitutional:  Negative for chills and fever.  HENT:  Positive for congestion, postnasal drip and sore throat. Negative for trouble swallowing and voice change.   Respiratory:  Positive for cough. Negative for shortness of breath.   Cardiovascular:  Positive for chest pain. Negative for palpitations and leg swelling.  Neurological:  Negative for light-headedness.  All other systems reviewed and are negative.   Physical Exam Updated Vital Signs BP (!) 141/94   Pulse (!) 104   Temp 99.1 F (37.3 C) (Oral) Comment: 1G tylenol @ 20:00  Resp 19   Ht '5\' 6"'$  (1.676 m)   Wt 104 kg   SpO2 94%   BMI 37.01 kg/m  Physical Exam Vitals and nursing note reviewed.  Constitutional:      General: She is not in acute distress.    Appearance: Normal appearance. She is not ill-appearing.  HENT:     Head: Normocephalic and atraumatic.     Nose: Nose normal.  Eyes:     General: No scleral icterus.    Extraocular Movements: Extraocular movements intact.     Conjunctiva/sclera: Conjunctivae normal.  Cardiovascular:     Rate and Rhythm: Regular rhythm. Tachycardia present.     Pulses: Normal pulses.  Pulmonary:     Effort: Pulmonary effort is normal. No respiratory distress.     Breath sounds: Normal breath sounds. No wheezing or rales.  Musculoskeletal:        General: Normal range of motion.     Cervical back: Normal range of motion.     Right lower leg: No edema.     Left lower leg: No edema.  Skin:    General: Skin is warm and dry.  Neurological:     General: No focal deficit present.     Mental Status: She is alert and oriented to person, place, and time. Mental status is at baseline.     ED Results / Procedures / Treatments   Labs (all labs ordered are listed, but only abnormal results are displayed) Labs  Reviewed  RESP PANEL BY RT-PCR (RSV, FLU A&B, COVID)  RVPGX2 - Abnormal; Notable for the following components:      Result Value   Influenza A by PCR POSITIVE (*)    All other components within normal limits    EKG None  Radiology DG Chest 2 View  Result Date: 04/07/2022 CLINICAL DATA:  Chest pain EXAM: CHEST - 2 VIEW COMPARISON:  03/16/2021 FINDINGS: Cardiac shadow is stable. The lungs are hypoinflated but clear. No bony abnormality is  noted. IMPRESSION: No acute abnormality noted. Electronically Signed   By: Inez Catalina M.D.   On: 04/07/2022 22:20    Procedures Procedures    Medications Ordered in ED Medications - No data to display  ED Course/ Medical Decision Making/ A&P                             Medical Decision Making Amount and/or Complexity of Data Reviewed Radiology: ordered.   42 year old female presents today for evaluation of URI symptoms.  Symptoms ongoing for the past 2 days.  She reports recent positive flu contact.  Overall she is well-appearing.  Mildly tachycardic.  Afebrile.  Without acute distress.  Respiratory panel positive for influenza A.  Chest x-ray negative for acute cardiopulmonary process.  KG without acute ischemic symptoms.  Shared decision making had regarding obtaining blood work to rule out ACS.  Following shared decision making given patient's chest pain is exertional with cough started 2 days after significant coughing spells this is unlikely to be ACS.  Will prescribe Hycodan, and Tamiflu.  Patient is in agreement with this.  Patient is appropriate for discharge.  Discharged in stable condition.  Return precaution discussed.  Discussed increasing hydration.  Final Clinical Impression(s) / ED Diagnoses Final diagnoses:  Influenza A    Rx / DC Orders ED Discharge Orders          Ordered    HYDROcodone bit-homatropine (HYCODAN) 5-1.5 MG/5ML syrup  Every 6 hours PRN        04/07/22 2256    oseltamivir (TAMIFLU) 75 MG capsule  Every 12  hours        04/07/22 2256              Evlyn Courier, PA-C 04/07/22 2303    Varney Biles, MD 04/07/22 2307

## 2022-04-08 ENCOUNTER — Telehealth: Payer: Self-pay | Admitting: Physician Assistant

## 2022-04-08 NOTE — Telephone Encounter (Signed)
Called patient to reschedule missed 1/31 appointment. Patient rescheduled and notified. Patient informed of no shows and that a letter will be sent following another no show.

## 2022-04-13 ENCOUNTER — Other Ambulatory Visit: Payer: Self-pay | Admitting: *Deleted

## 2022-04-16 LAB — QUANTIFERON-TB GOLD PLUS
QuantiFERON Mitogen Value: 3.01 IU/mL
QuantiFERON Nil Value: 0.02 IU/mL
QuantiFERON TB1 Ag Value: 0.04 IU/mL
QuantiFERON TB2 Ag Value: 0.02 IU/mL
QuantiFERON-TB Gold Plus: NEGATIVE

## 2022-04-18 ENCOUNTER — Other Ambulatory Visit: Payer: Self-pay | Admitting: Neurology

## 2022-04-18 ENCOUNTER — Telehealth: Payer: Self-pay

## 2022-04-18 ENCOUNTER — Other Ambulatory Visit: Payer: Self-pay | Admitting: *Deleted

## 2022-04-18 DIAGNOSIS — Z79899 Other long term (current) drug therapy: Secondary | ICD-10-CM

## 2022-04-18 DIAGNOSIS — G35 Multiple sclerosis: Secondary | ICD-10-CM

## 2022-04-18 DIAGNOSIS — G35D Multiple sclerosis, unspecified: Secondary | ICD-10-CM

## 2022-04-18 MED ORDER — TERIFLUNOMIDE 14 MG PO TABS: 1.0000 | ORAL_TABLET | Freq: Every day | ORAL | 5 refills | Status: DC

## 2022-04-18 NOTE — Telephone Encounter (Signed)
Please look at message from today 04/18/2022

## 2022-04-18 NOTE — Telephone Encounter (Signed)
Please see message from today 04/18/2022 @ 3:07pm

## 2022-04-19 ENCOUNTER — Inpatient Hospital Stay: Payer: Medicare HMO | Attending: Physician Assistant | Admitting: Physician Assistant

## 2022-04-21 ENCOUNTER — Telehealth: Payer: Self-pay | Admitting: Neurology

## 2022-04-21 NOTE — Telephone Encounter (Signed)
Noted  

## 2022-04-21 NOTE — Telephone Encounter (Signed)
Called pt and informed her of the message nurse [facility] sent me. Pt said she will follow-up with [facility] right now.

## 2022-04-21 NOTE — Telephone Encounter (Signed)
Pt is calling. Stated she have called [place] [phone] and was told that her MS medication is not there. Pt is requesting a call back.

## 2022-04-21 NOTE — Telephone Encounter (Signed)
I sent her mychart message on 04/18/22 that she read that the prescription was sent to [facility] (it has to be filled through a specialty pharmacy). Their phone number is [phone]. She needs to call them to set up shipment of her medication and it will be mailed to her.  Please call pt and relay above information.

## 2022-04-26 ENCOUNTER — Other Ambulatory Visit: Payer: Self-pay | Admitting: *Deleted

## 2022-04-26 NOTE — Telephone Encounter (Signed)
Pt last seen 01/12/22 and next f/u 07/13/22. Per drug registry, last refilled 01/12/22 #30.

## 2022-04-27 MED ORDER — AMPHETAMINE-DEXTROAMPHET ER 20 MG PO CP24: ORAL_CAPSULE | ORAL | 0 refills | Status: DC

## 2022-05-02 ENCOUNTER — Encounter: Payer: Self-pay | Admitting: *Deleted

## 2022-05-17 ENCOUNTER — Other Ambulatory Visit: Payer: Self-pay | Admitting: Neurology

## 2022-05-22 ENCOUNTER — Other Ambulatory Visit: Payer: Self-pay | Admitting: Neurology

## 2022-05-23 NOTE — Telephone Encounter (Signed)
Video visit was on 07/01/21 Follow up scheduled on 07/13/22

## 2022-06-14 ENCOUNTER — Other Ambulatory Visit: Payer: Self-pay | Admitting: Neurology

## 2022-06-23 ENCOUNTER — Telehealth: Payer: Self-pay | Admitting: Neurology

## 2022-06-23 NOTE — Telephone Encounter (Signed)
Called pt back. Informed her of message nurse [facility] sent. Pt said let me call them again. Stated she would call back if she had anymore issues.

## 2022-06-23 NOTE — Telephone Encounter (Signed)
Pt is requesting a refill for Teriflunomide 14 MG TABS .  Pharmacy: [facility] Pt states she was told by pharmacy that she does not have any refills

## 2022-06-23 NOTE — Telephone Encounter (Signed)
Phone room: please call pt back. [facility] should have refills on file. We last sent in refill 04/18/22 #30 and 5 refills

## 2022-07-04 ENCOUNTER — Other Ambulatory Visit: Payer: Self-pay | Admitting: Neurology

## 2022-07-05 MED ORDER — AMPHETAMINE-DEXTROAMPHET ER 20 MG PO CP24: ORAL_CAPSULE | ORAL | 0 refills | Status: DC

## 2022-07-07 ENCOUNTER — Other Ambulatory Visit: Payer: Self-pay | Admitting: Neurology

## 2022-07-13 ENCOUNTER — Other Ambulatory Visit: Payer: Self-pay

## 2022-07-13 ENCOUNTER — Ambulatory Visit: Payer: Medicare HMO | Admitting: Neurology

## 2022-07-13 ENCOUNTER — Emergency Department (HOSPITAL_COMMUNITY)
Admission: EM | Admit: 2022-07-13 | Discharge: 2022-07-13 | Disposition: A | Discharge status: Home / Self Care | Payer: Medicare HMO | Attending: Emergency Medicine | Admitting: Emergency Medicine

## 2022-07-13 DIAGNOSIS — R531 Weakness: Secondary | ICD-10-CM | POA: Diagnosis present

## 2022-07-13 DIAGNOSIS — N390 Urinary tract infection, site not specified: Secondary | ICD-10-CM | POA: Insufficient documentation

## 2022-07-13 DIAGNOSIS — Z7901 Long term (current) use of anticoagulants: Secondary | ICD-10-CM | POA: Insufficient documentation

## 2022-07-13 LAB — BASIC METABOLIC PANEL
Anion gap: 7 (ref 5–15)
BUN: 16 mg/dL (ref 6–20)
CO2: 24 mmol/L (ref 22–32)
Calcium: 9 mg/dL (ref 8.9–10.3)
Chloride: 107 mmol/L (ref 98–111)
Creatinine, Ser: 1.41 mg/dL — ABNORMAL HIGH (ref 0.44–1.00)
GFR, Estimated: 47 mL/min — ABNORMAL LOW (ref >60)
Glucose, Bld: 112 mg/dL — ABNORMAL HIGH (ref 70–99)
Potassium: 4.1 mmol/L (ref 3.5–5.1)
Sodium: 138 mmol/L (ref 135–145)

## 2022-07-13 LAB — URINALYSIS, ROUTINE W REFLEX MICROSCOPIC
Bilirubin Urine: NEGATIVE
Glucose, UA: NEGATIVE mg/dL
Ketones, ur: NEGATIVE mg/dL
Nitrite: NEGATIVE
Protein, ur: NEGATIVE mg/dL
Specific Gravity, Urine: 1.025 (ref 1.005–1.030)
pH: 5 (ref 5.0–8.0)

## 2022-07-13 LAB — CBC
HCT: 42.8 % (ref 36.0–46.0)
Hemoglobin: 14.7 g/dL (ref 12.0–15.0)
MCH: 33 pg (ref 26.0–34.0)
MCHC: 34.3 g/dL (ref 30.0–36.0)
MCV: 96.2 fL (ref 80.0–100.0)
Platelets: 245 10*3/uL (ref 150–400)
RBC: 4.45 MIL/uL (ref 3.87–5.11)
RDW: 13.6 % (ref 11.5–15.5)
WBC: 5.9 10*3/uL (ref 4.0–10.5)
nRBC: 0 % (ref 0.0–0.2)

## 2022-07-13 LAB — CBG MONITORING, ED: Glucose-Capillary: 101 mg/dL — ABNORMAL HIGH (ref 70–99)

## 2022-07-13 LAB — I-STAT BETA HCG BLOOD, ED (MC, WL, AP ONLY): I-stat hCG, quantitative: <5 m[IU]/mL (ref <5)

## 2022-07-13 MED ORDER — CEPHALEXIN 500 MG PO CAPS: 500.0000 mg | ORAL_CAPSULE | Freq: Two times a day (BID) | ORAL | 0 refills | Status: AC

## 2022-07-13 MED ORDER — DOXYCYCLINE HYCLATE 100 MG PO CAPS: 100.0000 mg | ORAL_CAPSULE | Freq: Two times a day (BID) | ORAL | 0 refills | Status: DC

## 2022-07-13 NOTE — ED Provider Notes (Signed)
[place] EMERGENCY DEPARTMENT AT [facility] Provider Note   CSN: [id number] Arrival date & time: 07/13/22  0235     History  Chief Complaint  Patient presents with   Weakness    Dawn Peters is a 42 y.o. female.  42 year old female with history of MS presents with complaint of 1 mechanical fall times over the past few days, states this happens periodically with her MS.  States that she put a tampon a week ago, is unsure if it came out or not, has felt dizzy at times and is concerned she has toxic shock syndrome.  Denies any vomiting, diarrhea, fevers or pelvic pain.  No injuries from her falls.  No other complaints or concerns. States her neurologist generally manages her MS, last about 3 months ago.       Home Medications Prior to Admission medications   Medication Sig Start Date End Date Taking? Authorizing Provider  cephALEXin (KEFLEX) 500 MG capsule Take 1 capsule (500 mg total) by mouth 2 (two) times daily for 7 days. 07/13/22 07/20/22 Yes Jeannie Fend, PA-C  doxycycline (VIBRAMYCIN) 100 MG capsule Take 1 capsule (100 mg total) by mouth 2 (two) times daily. 07/13/22  Yes Jeannie Fend, PA-C  acetaminophen (TYLENOL) 500 MG tablet Take 1,000 mg by mouth every 6 (six) hours as needed for moderate pain or headache.    [provider]  amantadine (SYMMETREL) 100 MG capsule Take 100 mg by mouth 2 (two) times daily. 09/27/20   [provider]  amitriptyline (ELAVIL) 25 MG tablet TAKE 1 TO 2 TABLETS (25mg  to 50mg ) BY MOUTH AT BEDTIME 06/14/22   Sater, Pearletha Furl, MD  amLODipine (NORVASC) 5 MG tablet Take 1 tablet (5 mg total) by mouth daily. Patient taking differently: Take 5 mg by mouth daily after breakfast. 05/21/17   Arrien, York Ram, MD  amphetamine-dextroamphetamine (ADDERALL XR) 20 MG 24 hr capsule TAKE ONE CAPSULE DAILY AT 9AM (VIAL) 07/05/22   Sater, Pearletha Furl, MD  atorvastatin (LIPITOR) 10 MG tablet Take 10 mg by mouth daily. 12/24/20    [provider]  baclofen (LIORESAL) 10 MG tablet TAKE ONE TABLET BY MOUTH FOUR TIMES DAILY @ 9AM, 1PM, 5PM, & 9PM 02/24/22   Sater, Pearletha Furl, MD  busPIRone (BUSPAR) 15 MG tablet TAKE ONE TABLET BY MOUTH TWICE DAILY @ 9AM & 5PM 10/26/21   Sater, Pearletha Furl, MD  citalopram (CELEXA) 20 MG tablet TAKE ONE TABLET BY MOUTH DAILY AT 9AM 02/24/22   Sater, Pearletha Furl, MD  dalfampridine 10 MG TB12 TAKE 1 TABLET BY MOUTH TWICE DAILY 10/07/21   Sater, Pearletha Furl, MD  diphenhydrAMINE (BENADRYL) 25 mg capsule Take 25 mg by mouth every 6 (six) hours as needed for itching.    [provider]  ELIQUIS 5 MG TABS tablet TAKE ONE TABLET BY MOUTH TWICE DAILY @ 9AM & 9PM 12/27/21   Sater, Pearletha Furl, MD  folic acid (FOLVITE) 1 MG tablet Take 1 tablet (1 mg total) by mouth daily. 04/19/21   Regalado, Prentiss Bells, MD  gabapentin (NEURONTIN) 800 MG tablet TAKE ONE TABLET BY MOUTH FOUR TIMES DAILY @ 9AM, 1PM, 5PM & 9PM 05/18/22   Sater, Pearletha Furl, MD  HYDROcodone bit-homatropine (HYCODAN) 5-1.5 MG/5ML syrup Take 5 mLs by mouth every 6 (six) hours as needed for cough. 04/07/22   Karie Mainland, Amjad, PA-C  JARDIANCE 10 MG TABS tablet Take 10 mg by mouth daily. 03/14/21   [provider]  Karlene Einstein 72  MCG capsule Take 72 mcg by mouth daily as needed (constipation). 02/18/21   [provider]  metoprolol succinate (TOPROL-XL) 50 MG 24 hr tablet Take 50 mg by mouth daily. 09/27/20   [provider]  Multiple Vitamin (MULTIVITAMIN WITH MINERALS) TABS tablet Take 1 tablet by mouth daily. 09/20/17   Darlin Drop, DO  norethindrone (MICRONOR) 0.35 MG tablet Take 1 tablet (0.35 mg total) by mouth daily. 11/19/18   Venora Maples, MD  omeprazole (PRILOSEC) 40 MG capsule Take 40 mg by mouth daily. 09/27/20   [provider]  oseltamivir (TAMIFLU) 75 MG capsule Take 1 capsule (75 mg total) by mouth every 12 (twelve) hours. 04/07/22   Marita Kansas, PA-C  predniSONE (DELTASONE) 50 MG tablet 12 pills (600 mg) po  daily x 3 days for MS exacerbation 01/12/22   Sater, Pearletha Furl, MD  SUMAtriptan (IMITREX) 100 MG tablet TAKE ONE TABLET BY MOUTH ONE TIME FOR UP TO ONE DOSE AS NEEDED FOR MIGRAINE. MAY REPEAT IN 2 HOURS IF HEADACHE PRESISTS OR RECURS. (ORIG) 01/19/22   Sater, Pearletha Furl, MD  tamsulosin (FLOMAX) 0.4 MG CAPS capsule TAKE 1 CAPSULE(0.4 MG) BY MOUTH DAILY 04/19/22   Sater, Pearletha Furl, MD  Teriflunomide 14 MG TABS Take 1 tablet (14 mg total) by mouth daily. 04/18/22   Sater, Pearletha Furl, MD  tiZANidine (ZANAFLEX) 4 MG tablet Take 1 tablet (4 mg total) by mouth at bedtime. 01/12/22   Sater, Pearletha Furl, MD  vitamin B-12 (CYANOCOBALAMIN) 1000 MCG tablet Take 1 tablet (1,000 mcg total) by mouth daily. 04/20/21   Regalado, Belkys A, MD      Allergies    Ace inhibitors, Amoxicillin, and Lisinopril    Review of Systems   Review of Systems Negative except as per HPI Physical Exam Updated Vital Signs BP (!) 116/92   Pulse 100   Temp 97.9 F (36.6 C) (Oral)   Resp 18   SpO2 98%  Physical Exam Vitals and nursing note reviewed. Exam conducted with a chaperone present.  Constitutional:      General: She is not in acute distress.    Appearance: She is well-developed. She is not diaphoretic.  HENT:     Head: Normocephalic and atraumatic.  Pulmonary:     Effort: Pulmonary effort is normal.  Abdominal:     Palpations: Abdomen is soft.     Tenderness: There is no abdominal tenderness.  Genitourinary:    Comments: Tampon present, removed by string externally. Vagina viewed with speculum, no further FB noted, no discharge.  Musculoskeletal:     Right lower leg: No edema.     Left lower leg: No edema.  Skin:    General: Skin is warm and dry.     Findings: No erythema or rash.  Neurological:     Mental Status: She is alert and oriented to person, place, and time.     Sensory: No sensory deficit.     Motor: No weakness.  Psychiatric:        Behavior: Behavior normal.     ED Results / Procedures /  Treatments   Labs (all labs ordered are listed, but only abnormal results are displayed) Labs Reviewed  BASIC METABOLIC PANEL - Abnormal; Notable for the following components:      Result Value   Glucose, Bld 112 (*)    Creatinine, Ser 1.41 (*)    GFR, Estimated 47 (*)    All other components within normal limits  URINALYSIS, ROUTINE W  REFLEX MICROSCOPIC - Abnormal; Notable for the following components:   APPearance HAZY (*)    Hgb urine dipstick SMALL (*)    Leukocytes,Ua MODERATE (*)    Bacteria, UA FEW (*)    All other components within normal limits  CBG MONITORING, ED - Abnormal; Notable for the following components:   Glucose-Capillary 101 (*)    All other components within normal limits  CBC  I-STAT BETA HCG BLOOD, ED ([facility], [facility], [facility] ONLY)    EKG None  Radiology No results found.  Procedures Procedures    Medications Ordered in ED Medications - No data to display  ED Course/ Medical Decision Making/ A&P                             Medical Decision Making Amount and/or Complexity of Data Reviewed Labs: ordered.   42 year old female with MS presents with report of several falls over the past few days, came to the ER with concern for retained tampon which she inserted 1 week ago and does not remember removing. States she is scheduled to see her neurologist tomorrow who usually starts her on steroids when her MS flares.  CBC is within normal meds, specifically normal WBC.  BMP with creatinine of 1.41, similar to prior on file.  hCG negative.  Urinalysis is positive for hemoglobin, leukocytes with few bacteria and 11-20 white cells, 0-5 epithelials. Chaperone present for pelvic exam, tampon was removed with string which was visible externally without difficulty.  Speculum exam reveals no further retained foreign bodies, no discharge. Urinalysis concerning for UTI.  Plan is to treat patient's UTI, will also cover for pelvic infection given her retained tampon although no  evidence of PID as she is afebrile with normal white count today.  Plan is to recheck with her neurologist tomorrow as scheduled with return to ER precautions provided.  Patient agrees with plan of care.        Final Clinical Impression(s) / ED Diagnoses Final diagnoses:  Urinary tract infection in female    Rx / DC Orders ED Discharge Orders          Ordered    doxycycline (VIBRAMYCIN) 100 MG capsule  2 times daily        07/13/22 0612    cephALEXin (KEFLEX) 500 MG capsule  2 times daily        07/13/22 0612              Jeannie Fend, PA-C 07/13/22 [id number]    Palumbo, April, MD 07/13/22 [phone]

## 2022-07-13 NOTE — Discharge Instructions (Signed)
Take antibiotics as prescribed.  See your doctor as scheduled tomorrow.  Return to ER for worsening or concerning symptoms.

## 2022-07-13 NOTE — ED Triage Notes (Signed)
Patient arrived with complaints of generalized weakness over the last few days. Reports three mechanical falls since 4pm, no pain or injury. States history of MS.

## 2022-07-13 NOTE — ED Notes (Signed)
Patient given discharge instructions and follow up care. Patient verbalized understanding. Patient taken out of ED via wheelchair with family. 

## 2022-07-13 NOTE — ED Notes (Signed)
Patient added she last used a tampon three days ago and is unsure if it came out or not.

## 2022-07-14 ENCOUNTER — Other Ambulatory Visit: Payer: Self-pay | Admitting: Neurology

## 2022-07-14 ENCOUNTER — Other Ambulatory Visit: Payer: Self-pay

## 2022-07-21 ENCOUNTER — Other Ambulatory Visit: Payer: Self-pay

## 2022-07-21 ENCOUNTER — Other Ambulatory Visit: Payer: Self-pay | Admitting: Neurology

## 2022-07-28 ENCOUNTER — Telehealth: Payer: Self-pay | Admitting: Neurology

## 2022-07-28 ENCOUNTER — Other Ambulatory Visit: Payer: Self-pay

## 2022-07-28 ENCOUNTER — Encounter: Payer: Self-pay | Admitting: Neurology

## 2022-07-28 MED ORDER — METHYLPREDNISOLONE 4 MG PO TBPK: ORAL_TABLET | ORAL | 0 refills | Status: DC

## 2022-07-28 NOTE — Telephone Encounter (Signed)
Pt is asking if Dr Epimenio Foot has been able to review her lab work results that she had done at [facility] earlier this month(around May 7th or 8th) Pt also asking for the status of her refill request on her predniSONE (DELTASONE) 50 MG tablet  to [facility] [phone]

## 2022-07-28 NOTE — Telephone Encounter (Signed)
Patient informed medrol dose pack was sent into pharmacy. Pt informed with Dr.Sater message as well.

## 2022-07-28 NOTE — Telephone Encounter (Signed)
Dr.Sater I called patient to discuss the below. Pt was seen at the in ER on 07/13/22 and treated for UTI she is currently still taking cephalexin 500 mg tablet and doxycycline 100 mg capsule . She would like for you to look at her labs from ER.   Pt was scheduled to see you on 07/13/22 but canceled (reports someone at the office told her that she can cancel since she had labs done at ER and her visit here was for repeat labs). Pt said she has been dragging her left foot x 3 weeks now, no vision changes, no eye pain, no other weakness besides dragging left foot. She asked if you would be willing to prescribe prednisone for her? She reports anytime this happens you typically send in Rx for her. Pt is leaving out of town for the [facility] day holiday and she would prefer not to leave in her current condition. Rx can be sent to [facility] if approved.  Please advise

## 2022-08-03 ENCOUNTER — Telehealth: Payer: Self-pay | Admitting: Neurology

## 2022-08-03 ENCOUNTER — Other Ambulatory Visit: Payer: Self-pay | Admitting: Neurology

## 2022-08-03 DIAGNOSIS — Z1231 Encounter for screening mammogram for malignant neoplasm of breast: Secondary | ICD-10-CM

## 2022-08-03 NOTE — Telephone Encounter (Signed)
Pt states that the mail order pharmacy is not mailing her both BP medications and she is needing to discuss with RN. Please advise.

## 2022-08-03 NOTE — Telephone Encounter (Signed)
Called pt back and told her to call her PCP to get her blood pressure medication since she hasn't had it in awhile and she is having headaches. Pt verbalized understanding.

## 2022-08-11 ENCOUNTER — Ambulatory Visit: Payer: Medicare HMO

## 2022-08-12 ENCOUNTER — Other Ambulatory Visit: Payer: Self-pay | Admitting: Neurology

## 2022-08-18 ENCOUNTER — Ambulatory Visit (INDEPENDENT_AMBULATORY_CARE_PROVIDER_SITE_OTHER): Payer: Medicare HMO | Admitting: Neurology

## 2022-08-18 ENCOUNTER — Encounter: Payer: Self-pay | Admitting: Neurology

## 2022-08-18 VITALS — BP 121/85 | HR 93 | Ht 66.0 in | Wt 243.0 lb

## 2022-08-18 DIAGNOSIS — R269 Unspecified abnormalities of gait and mobility: Secondary | ICD-10-CM | POA: Diagnosis not present

## 2022-08-18 DIAGNOSIS — G35 Multiple sclerosis: Secondary | ICD-10-CM | POA: Diagnosis not present

## 2022-08-18 DIAGNOSIS — R531 Weakness: Secondary | ICD-10-CM

## 2022-08-18 DIAGNOSIS — M21372 Foot drop, left foot: Secondary | ICD-10-CM | POA: Diagnosis not present

## 2022-08-18 DIAGNOSIS — Z79899 Other long term (current) drug therapy: Secondary | ICD-10-CM

## 2022-08-18 MED ORDER — AMANTADINE HCL 100 MG PO CAPS: 100.0000 mg | ORAL_CAPSULE | Freq: Two times a day (BID) | ORAL | 3 refills | Status: DC

## 2022-08-18 NOTE — Progress Notes (Signed)
[facility]  PATIENT: Dawn Peters DOB: 07-01-1980  REFERRING DOCTOR OR PCP:  Dr. Orpah Cobb SOURCE: Patient, records in EMR, lab results and radiology reports in EMR, MRI images reviewed on PACS.  _________________________________   HISTORICAL  CHIEF COMPLAINT:  Chief Complaint  Patient presents with   Room 10    Pt is here with her Friend. Pt states that things have been going good since her last appointment.     HISTORY OF PRESENT ILLNESS:  Dawn Peters is a 42 y.o. woman who was diagnosed with relapsing remitting MS in 2000.     Update 08/18/2022 She has been on teriflunomide as her disease modifying therapy since January 2024.  Before that, she was on Zeposia but lymphocyte count dropped very low.  Of note, she had been on [place] before that. Lymphocytes were back to normal in January 2024 (0.8) and we started teriflunomide.   Last year, she also had anemia and needed transfusions ( normocytic, normochromic and hypoproliferative component anemia not autoimmune).         She has a poor gait and uses a walker mostly and sometimes a cane for shorter distance.   She has had more progressive change in left leg over past few years.    Her strength always feels better if shegets an IV steroid and we discussed difference between relapses and fluctuations.       Dysesthesias are better on  gabapentin 800 mg po tid.      She has urinary urgency and hesitancy.  She is on tamsulosin.   She sees urology.     She has stage 3 CKD and sees Nephrology at [facility] (last crt was 1.41  Vision is stable.    Her fatigue is about the same.  She sleeps well most nights.  Depression and anxiety are better with the citalopram and anxiety is better with BuSpar .   She is taking vitamin D for her deficiency.  She has 2-3 migraines a month, helped by sumatriptan.   She continues to report back pain and left leg pain that radiates.  She was seeing  [facility] on  [facility] for Pain Management annd would like a referral to a differntn provider.     MS history:   She was diagnosed with MS in 2000 after presenting with gait difficulties, numbness and headaches. An MRI of the brain was consistent with MS. She was then started on Betaseron. She had difficulty tolerating Betaseron and at some point switched to Novantrone. She took Novantrone every 3 months for a year or so. A little later, she was placed on Tysabri and she stayed on it for a couple of years. However, she was JCV-positive and switched off. She did well on Tysabri. She had been on Tecfidera since 2015. Her MRI from June 2015 showed that there had been no changes compared to an MRI from 2014.   However, she had an exacerbation March 2017.   The 08/07/2013 MRI of the brain shows multiple T2/FLAIR hyperintense foci located in the cerebellum, middle cerebellar peduncles, pons and in the periventricular white matter of the hemispheres in a pattern and configuration consistent with chronic demyelinating plaque associated with MS. There were no acute findings. There was no change compared to the previous MRI from 01/16/2013 that was also reviewed. She switched to [place] in July,2017/Aug 2018.   Due to progression she was shortly on Ocrevus in 2020 and then switched to [facility] January 2021.  Due to persistent low lymphocytes (even on qod dosing), Zeposia was dicontinued 07/01/2021   Physical Exam She is a well-developed well-nourished woman in no acute distress.  The head is normocephalic and atraumatic.  Sclera are anicteric.  Visible skin appears normal.  The neck has a good range of motion. She is alert and fully oriented with fluent speech and good attention, knowledge and memory.  Extraocular muscles are intact.  Facial strength is normal.  She appears to have normal strength in the arms.  Rapid alternating movements and finger-nose-finger are performed well.   IMAGING:  MRI cervical spine 08/31/2020  showed ubtle T2 hyperintense signal within the cord and there were no acute findings.  MRI brain 08/31/2020 showed Unchanged distribution of white matter lesions in a pattern compatible with multiple sclerosis. No active demyelinating lesions.  REVIEW OF SYSTEMS: Constitutional: No fevers, chills, sweats, or change in appetite.   She has fatigue.  Some insomnia. Eyes: No visual changes, double vision, eye pain Ear, nose and throat: No hearing loss, ear pain, nasal congestion, sore throat Cardiovascular: No chest pain, palpitations Respiratory:  No shortness of breath at rest or with exertion.   No wheezes GastrointestinaI: No nausea, vomiting, diarrhea, abdominal pain, fecal incontinence Genitourinary:  No dysuria.   She has hesitancy. Sometimes she has difficulty emptying completely Flomax has helped urinary hesitancy. 1 x nocturia. Musculoskeletal: She notes pain in the left leg and some back pain Integumentary: No rash, pruritus, skin lesions Neurological: as above Psychiatric: Mild depression at this time.  Moderate anxiety Endocrine: No palpitations, diaphoresis, change in appetite, change in weigh or increased thirst Hematologic/Lymphatic:  No anemia, purpura, petechiae. Allergic/Immunologic: No itchy/runny eyes, nasal congestion, recent allergic reactions, rashes  ALLERGIES: Allergies  Allergen Reactions   Ace Inhibitors Swelling   Amoxicillin Itching    Did it involve swelling of the face/tongue/throat, SOB, or low BP? Yes Did it involve sudden or severe rash/hives, skin peeling, or any reaction on the inside of your mouth or nose? No Did you need to seek medical attention at a hospital or doctor's office? No When did it last happen?      unknown  If all above answers are "NO", may proceed with cephalosporin use.;   Lisinopril Swelling    Lower lip swelling    HOME MEDICATIONS:  Current Outpatient Medications:    acetaminophen (TYLENOL) 500 MG tablet, Take 1,000 mg by  mouth every 6 (six) hours as needed for moderate pain or headache., Disp: , Rfl:    amantadine (SYMMETREL) 100 MG capsule, Take 100 mg by mouth 2 (two) times daily., Disp: , Rfl:    amitriptyline (ELAVIL) 25 MG tablet, TAKE 1 TO 2 TABLETS (25mg  to 50mg ) BY MOUTH AT BEDTIME, Disp: 180 tablet, Rfl: 0   amLODipine (NORVASC) 5 MG tablet, Take 1 tablet (5 mg total) by mouth daily. (Patient taking differently: Take 5 mg by mouth daily after breakfast.), Disp: 90 tablet, Rfl: 0   amphetamine-dextroamphetamine (ADDERALL XR) 20 MG 24 hr capsule, TAKE ONE CAPSULE DAILY AT 9AM (VIAL), Disp: 30 capsule, Rfl: 0   atorvastatin (LIPITOR) 10 MG tablet, Take 10 mg by mouth daily., Disp: , Rfl:    baclofen (LIORESAL) 10 MG tablet, TAKE ONE TABLET BY MOUTH FOUR TIMES DAILY @ 9AM, 1PM, 5PM, & 9PM, Disp: 360 tablet, Rfl: 11   busPIRone (BUSPAR) 15 MG tablet, TAKE ONE TABLET BY MOUTH TWICE DAILY @ 9AM & 5PM, Disp: 180 tablet, Rfl: 1   citalopram (CELEXA) 20 MG  tablet, TAKE ONE TABLET BY MOUTH DAILY AT 9AM, Disp: 90 tablet, Rfl: 11   dalfampridine 10 MG TB12, TAKE ONE TABLET BY MOUTH TWICE DAILY @ 9AM & 9PM, Disp: 60 tablet, Rfl: 0   diphenhydrAMINE (BENADRYL) 25 mg capsule, Take 25 mg by mouth every 6 (six) hours as needed for itching., Disp: , Rfl:    doxycycline (VIBRAMYCIN) 100 MG capsule, Take 1 capsule (100 mg total) by mouth 2 (two) times daily., Disp: 20 capsule, Rfl: 0   ELIQUIS 5 MG TABS tablet, TAKE ONE TABLET BY MOUTH TWICE DAILY @ 9AM & 9PM, Disp: 60 tablet, Rfl: 15   folic acid (FOLVITE) 1 MG tablet, Take 1 tablet (1 mg total) by mouth daily., Disp: 30 tablet, Rfl: 1   gabapentin (NEURONTIN) 800 MG tablet, TAKE ONE TABLET BY MOUTH FOUR TIMES DAILY @ 9AM, 1PM, 5PM & 9PM, Disp: 120 tablet, Rfl: 2   JARDIANCE 10 MG TABS tablet, Take 10 mg by mouth daily., Disp: , Rfl:    LINZESS 72 MCG capsule, Take 72 mcg by mouth daily as needed (constipation)., Disp: , Rfl:    methylPREDNISolone (MEDROL DOSEPAK) 4 MG TBPK  tablet, Take by mouth as directed. Take 6 tablets on day one, then decrease by one tablet daily until gone/fim, Disp: 21 tablet, Rfl: 0   metoprolol succinate (TOPROL-XL) 50 MG 24 hr tablet, Take 50 mg by mouth daily., Disp: , Rfl:    Multiple Vitamin (MULTIVITAMIN WITH MINERALS) TABS tablet, Take 1 tablet by mouth daily., Disp: 30 tablet, Rfl: 0   norethindrone (MICRONOR) 0.35 MG tablet, Take 1 tablet (0.35 mg total) by mouth daily., Disp: 1 Package, Rfl: 11   omeprazole (PRILOSEC) 40 MG capsule, Take 40 mg by mouth daily., Disp: , Rfl:    predniSONE (DELTASONE) 50 MG tablet, 12 pills (600 mg) po daily x 3 days for MS exacerbation, Disp: 36 tablet, Rfl: 0   SUMAtriptan (IMITREX) 100 MG tablet, TAKE ONE TABLET BY MOUTH ONE TIME FOR UP TO ONE DOSE AS NEEDED FOR MIGRAINE. MAY REPEAT IN 2 HOURS IF HEADACHE PRESISTS OR RECURS. (ORIG), Disp: 10 tablet, Rfl: 11   tamsulosin (FLOMAX) 0.4 MG CAPS capsule, TAKE 1 CAPSULE(0.4 MG) BY MOUTH DAILY, Disp: 30 capsule, Rfl: 5   Teriflunomide 14 MG TABS, Take 1 tablet (14 mg total) by mouth daily., Disp: 30 tablet, Rfl: 5   tiZANidine (ZANAFLEX) 4 MG tablet, Take 1 tablet (4 mg total) by mouth at bedtime., Disp: 360 tablet, Rfl: 3   vitamin B-12 (CYANOCOBALAMIN) 1000 MCG tablet, Take 1 tablet (1,000 mcg total) by mouth daily., Disp: 20 tablet, Rfl: 0   HYDROcodone bit-homatropine (HYCODAN) 5-1.5 MG/5ML syrup, Take 5 mLs by mouth every 6 (six) hours as needed for cough., Disp: 120 mL, Rfl: 0   oseltamivir (TAMIFLU) 75 MG capsule, Take 1 capsule (75 mg total) by mouth every 12 (twelve) hours., Disp: 10 capsule, Rfl: 0 No current facility-administered medications for this visit.  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   left lower extremity-- treated with eliquis   Gait disturbance    due to MS   GERD (gastroesophageal reflux disease)    watch diet   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/p  THA   History of DVT of lower extremity 2007   History of encephalopathy 04/2017   acute metabolic encephalopathy secondary to MS,  resolved   History of MRSA infection 2004   right hip infection post THA   History of pulmonary embolus (PE) 2005   Hydronephrosis, left    w/ acute kidney injury 02/ 2019   Hypertension    Left-sided weakness    due to MS   Migraines    MS (multiple sclerosis) [facility]) neurologist-  dr Sherilyn Dacosta   dx 2000--   Muscle spasticity    Neurogenic bladder    due to MS   Pulmonary embolism, bilateral (HCC) 09/17/2017   treated w/ eliquis   Strains to urinate    Urgency of urination     PAST SURGICAL HISTORY: Past Surgical History:  Procedure Laterality Date   CYSTOSCOPY WITH RETROGRADE PYELOGRAM, URETEROSCOPY AND STENT PLACEMENT Bilateral 11/29/2017   Procedure: BILATERAL  RETROGRADE PYELOGRAM, LEFT DIAGNOSIC URETEROSCOPY AND STENT LEFT PLACEMENT;  Surgeon: Crist Fat, MD;  Location: [facility];  Service: Urology;  Laterality: Bilateral;   HIP ARTHROSCOPY Left 07-05-2013   dr pill @[facility]    iliopsosas release, synovectomy   REVISION TOTAL HIP ARTHROPLASTY Right 03-28-2003    dr Charlann Boxer @[facility]    TOTAL HIP ARTHROPLASTY Bilateral left 11-05-2002  dr Charlann Boxer @[facility] ;   right 06/ 2004  @[facility]     FAMILY HISTORY: Family History  Problem Relation Age of Onset   Healthy Mother    Healthy Father    Colon cancer Paternal Grandmother    Breast cancer Paternal Grandmother 24    Hypertension Other    Diabetes Other     PHYSICAL EXAM   Today's Vitals   08/18/22 1554  BP: 121/85  Pulse: 93  Weight: 243 lb (110.2 kg)  Height: 5\' 6"  (1.676 m)   Body mass index is 39.22 kg/m.    General: The patient is well-developed and well-nourished and in no acute distress.   Neurologic Exam   Mental status: The patient is alert and oriented x 3 at the time of the examination. The patient has apparent normal recent and remote memory, with mildly reduced attention span and concentration ability.   Speech is normal.   Cranial nerves: Extraocular movements are full.  Facial strength and sensation is normal.  Trapezius strength is normal.. No obvious hearing deficits are noted.   Motor:  Muscle bulk is normal.  She has increased muscle tone in the left leg.  Strength is normal in the arms and 4+ to 5 in right leg and 3/5 left hip flexion,  4-/5 in the distal left leg and 4+/5 left quads   Sensory: Touch and vibration sensation is normal in the arms and legs.   Coordination: She performs finger-nose-finger well.  She has reduced heel-to-shin, left worse than right.      Gait and station: She is able to stand with unilateral support..  There is a left foot drop.   Reflexes: Deep tendon reflexes are increased in the legs, left greater than right.  She has crossed abductor responses, worse on the left.  She has nonsustained  clonus in the left ankle      DIAGNOSTIC DATA (LABS, IMAGING, TESTING) - I reviewed patient records, labs, notes, testing and imaging myself where available.  Lab Results  Component Value Date   WBC 5.9 07/13/2022   HGB 14.7 07/13/2022   HCT 42.8 07/13/2022   MCV 96.2 07/13/2022   PLT 245 07/13/2022      Component Value Date/Time   NA 138 07/13/2022 0329   NA 144 06/21/2017 1626   K 4.1 07/13/2022 0329   CL 107 07/13/2022 0329   CO2 24 07/13/2022 0329   GLUCOSE 112 (H) 07/13/2022 0329   BUN 16 07/13/2022 0329   BUN 16 06/21/2017 1626    CREATININE 1.41 (H) 07/13/2022 0329   CREATININE 1.51 (H) 03/31/2022 0949   CREATININE 0.85 07/03/2014 0959   CALCIUM 9.0 07/13/2022 0329   PROT 6.5 03/31/2022 0949   PROT 7.5 01/11/2018 1542   ALBUMIN 3.7 03/31/2022 0949   ALBUMIN 4.2 01/11/2018 1542   AST 13 (L) 03/31/2022 0949   ALT 11 03/31/2022 0949   ALKPHOS 102 03/31/2022 0949   BILITOT 0.6 03/31/2022 0949   GFRNONAA 47 (L) 07/13/2022 0329   GFRNONAA 44 (L) 03/31/2022 0949   GFRNONAA 88 07/03/2014 0959   GFRAA 45 (L) 09/01/2019 0503   GFRAA >89 07/03/2014 0959   Lab Results  Component Value Date   CHOL 228 (H) 04/20/2017   HDL 77 04/20/2017   LDLCALC 142 (H) 04/20/2017   TRIG 46 04/20/2017   CHOLHDL 3.0 04/20/2017   Lab Results  Component Value Date   HGBA1C 5.3 04/20/2017   Lab Results  Component Value Date   VITAMINB12 203 04/20/2021   Lab Results  Component Value Date   TSH 2.140 04/19/2021      ASSESSMENT AND PLAN    1. MS (multiple sclerosis) (HCC)   2. High risk medication use   3. Left foot drop   4. Gait disturbance   5. Left-sided weakness       1.   Continue teriflunomide   check labs 2.  Continue amantadine 3.    Continue baclofen and tizanidine 4.   Continue seeing hematology for anemia 5.   Form for home health services filled out today.  Prescription for new rollator..  6.   She will return to see me in 4 months or sooner if there are new or worsening neurologic symptoms.  43 minute office visit with the majority of the time spent face-to-face for history and physical, discussion/counseling and decision-making.  Additional time with record review and documentation.   This visit is part of a comprehensive longitudinal care medical relationship regarding the patients primary diagnosis of multiple sclerosis and related concerns.   Sherrod Toothman A. Epimenio Foot, MD, PhD 08/18/2022, 4:20 PM Certified in Neurology, Clinical Neurophysiology, Sleep Medicine, Pain Medicine and Neuroimaging  [facility] [address] [place], [place] [address] [phone] ]

## 2022-08-18 NOTE — Progress Notes (Signed)
Gave completed form re: request for independent assessment for personal care services attestation of medical need to [facility]/medical records to fax in for pt.

## 2022-08-19 LAB — CBC WITH DIFFERENTIAL/PLATELET
Basophils Absolute: 0 10*3/uL (ref 0.0–0.2)
Basos: 0 %
EOS (ABSOLUTE): 0.2 10*3/uL (ref 0.0–0.4)
Eos: 3 %
Hematocrit: 42 % (ref 34.0–46.6)
Hemoglobin: 14.4 g/dL (ref 11.1–15.9)
Immature Grans (Abs): 0 10*3/uL (ref 0.0–0.1)
Immature Granulocytes: 0 %
Lymphocytes Absolute: 1 10*3/uL (ref 0.7–3.1)
Lymphs: 20 %
MCH: 32.6 pg (ref 26.6–33.0)
MCHC: 34.3 g/dL (ref 31.5–35.7)
MCV: 95 fL (ref 79–97)
Monocytes Absolute: 0.6 10*3/uL (ref 0.1–0.9)
Monocytes: 11 %
Neutrophils Absolute: 3.3 10*3/uL (ref 1.4–7.0)
Neutrophils: 66 %
Platelets: 259 10*3/uL (ref 150–450)
RBC: 4.42 x10E6/uL (ref 3.77–5.28)
RDW: 12.6 % (ref 11.7–15.4)
WBC: 5 10*3/uL (ref 3.4–10.8)

## 2022-08-19 LAB — COMPREHENSIVE METABOLIC PANEL
ALT: 12 IU/L (ref 0–32)
AST: 15 IU/L (ref 0–40)
Albumin/Globulin Ratio: 1.7
Albumin: 4.3 g/dL (ref 3.9–4.9)
Alkaline Phosphatase: 116 IU/L (ref 44–121)
BUN/Creatinine Ratio: 10 (ref 9–23)
BUN: 15 mg/dL (ref 6–24)
Bilirubin Total: 0.2 mg/dL (ref 0.0–1.2)
CO2: 27 mmol/L (ref 20–29)
Calcium: 10 mg/dL (ref 8.7–10.2)
Chloride: 102 mmol/L (ref 96–106)
Creatinine, Ser: 1.51 mg/dL — ABNORMAL HIGH (ref 0.57–1.00)
Globulin, Total: 2.5 g/dL (ref 1.5–4.5)
Glucose: 89 mg/dL (ref 70–99)
Potassium: 4 mmol/L (ref 3.5–5.2)
Sodium: 143 mmol/L (ref 134–144)
Total Protein: 6.8 g/dL (ref 6.0–8.5)
eGFR: 43 mL/min/{1.73_m2} — ABNORMAL LOW (ref >59)

## 2022-08-22 ENCOUNTER — Other Ambulatory Visit: Payer: Self-pay | Admitting: Neurology

## 2022-09-12 ENCOUNTER — Other Ambulatory Visit: Payer: Self-pay | Admitting: Neurology

## 2022-09-13 ENCOUNTER — Other Ambulatory Visit: Payer: Self-pay | Admitting: Neurology

## 2022-09-13 ENCOUNTER — Other Ambulatory Visit: Payer: Self-pay

## 2022-09-13 NOTE — Telephone Encounter (Signed)
Last seen on 08/18/22 Follow up scheduled on 03/02/23

## 2022-09-14 MED ORDER — AMPHETAMINE-DEXTROAMPHET ER 20 MG PO CP24: ORAL_CAPSULE | ORAL | 0 refills | Status: DC

## 2022-09-15 ENCOUNTER — Other Ambulatory Visit: Payer: Self-pay | Admitting: Neurology

## 2022-09-20 MED ORDER — METHYLPREDNISOLONE 4 MG PO TBPK: ORAL_TABLET | ORAL | 0 refills | Status: DC

## 2022-09-20 NOTE — Telephone Encounter (Signed)
Dr.Sater  Patient sent message attached to Rx saying " Patient comment: Hello i am not understanding i can't get a refill i am dragging my left leg i am going out of town.Prednisone.Its the only thing that helps i do not wanna go to the Er. "   I called and spoke with patient she was seen on 08/18/22 reports she was dragging her left foot at the visit.  pt said her left side is the weakest  she is leaving for the beach this weekend and would like to have Rx to help with this. Pt said this always works for her, she reports no signs of infection or new problems. Follow up scheduled on 02/10/23  Please advise

## 2022-09-20 NOTE — Telephone Encounter (Signed)
Patient aware Rx has been approved.

## 2022-10-05 ENCOUNTER — Ambulatory Visit: Payer: Medicare HMO

## 2022-10-09 ENCOUNTER — Other Ambulatory Visit: Payer: Self-pay | Admitting: Neurology

## 2022-10-10 NOTE — Telephone Encounter (Signed)
Pt last seen on 08/18/22 Follow up scheduled on 03/02/23

## 2022-10-12 ENCOUNTER — Other Ambulatory Visit: Payer: Self-pay | Admitting: Neurology

## 2022-10-18 ENCOUNTER — Other Ambulatory Visit: Payer: Self-pay

## 2022-10-18 DIAGNOSIS — Z79899 Other long term (current) drug therapy: Secondary | ICD-10-CM

## 2022-10-18 DIAGNOSIS — G35 Multiple sclerosis: Secondary | ICD-10-CM

## 2022-10-23 IMAGING — MG MM DIGITAL SCREENING BILAT W/ TOMO AND CAD
8 series · 8 of 24 positions shown · non-contrast
Comparison: None Available.
COMPARISON: None Available.

Addendum:
CLINICAL DATA: Screening.

EXAM:
DIGITAL SCREENING BILATERAL MAMMOGRAM WITH TOMOSYNTHESIS AND CAD
TECHNIQUE: Bilateral screening digital craniocaudal and mediolateral oblique
mammograms were obtained. Bilateral screening digital breast
tomosynthesis was performed. The images were evaluated with
computer-aided detection.

[R CC synth-2D]
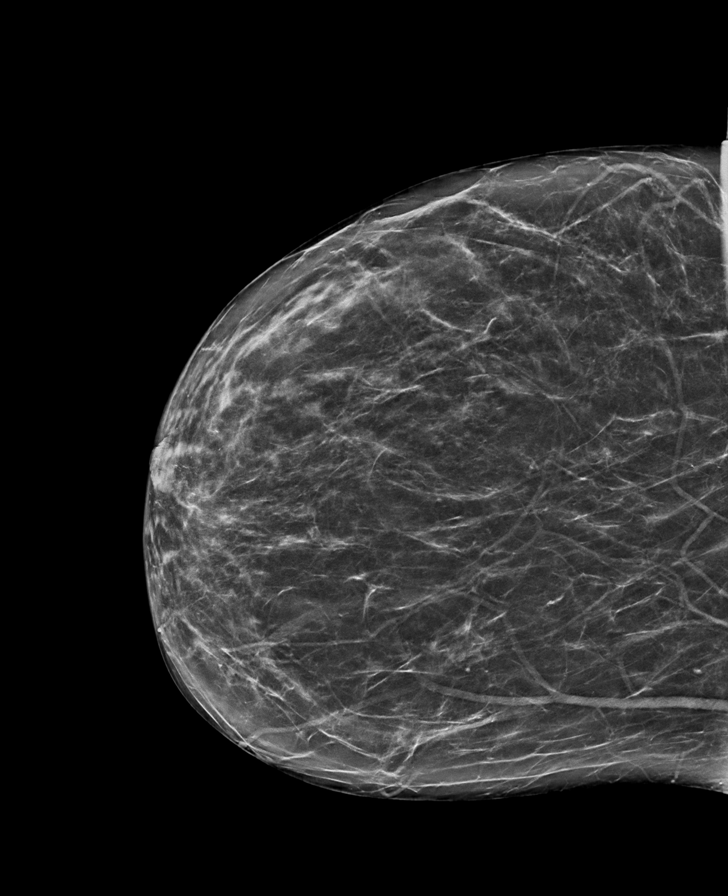

[L MLO synth-2D]
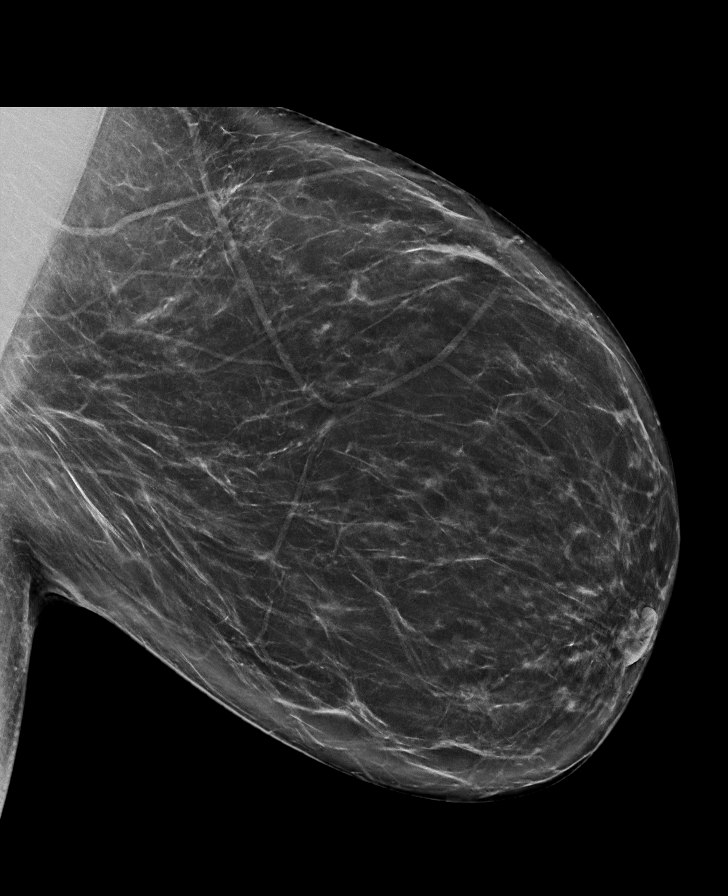

[R MLO synth-2D]
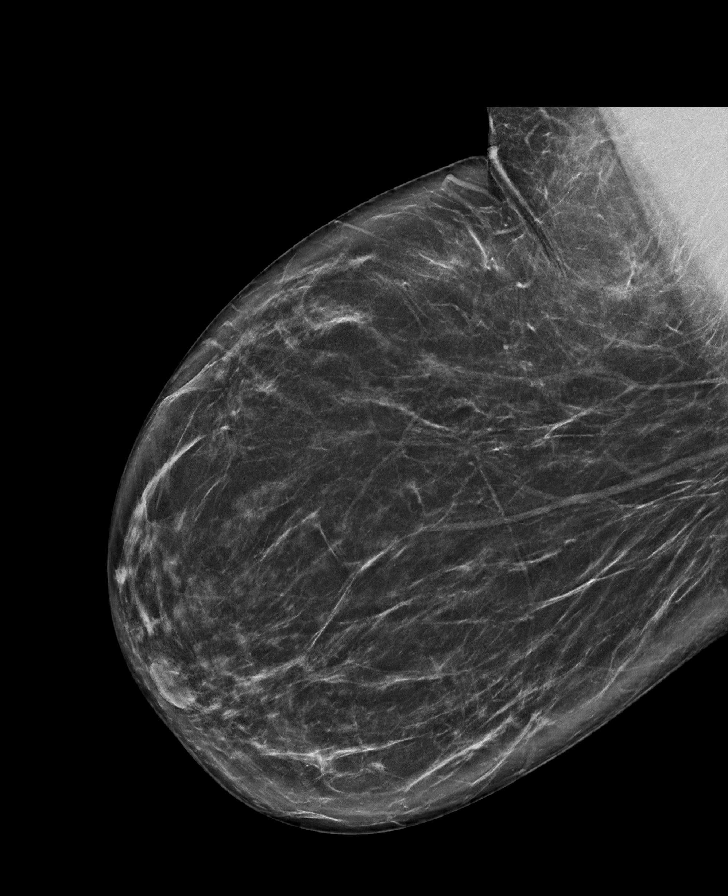

[L CC synth-2D]
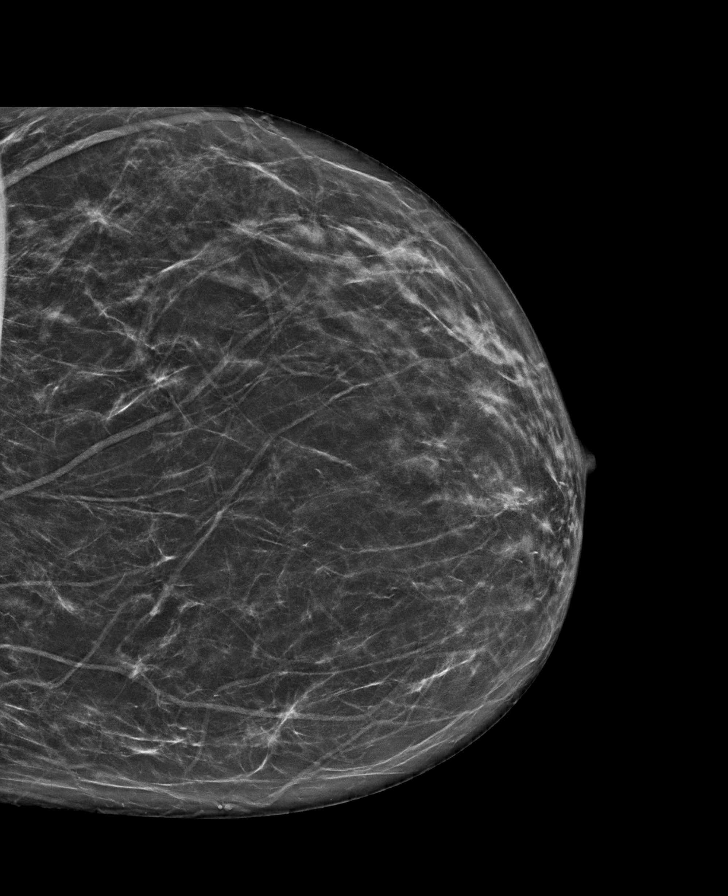

[R MLO tomo · tomo slice 41/82.0]
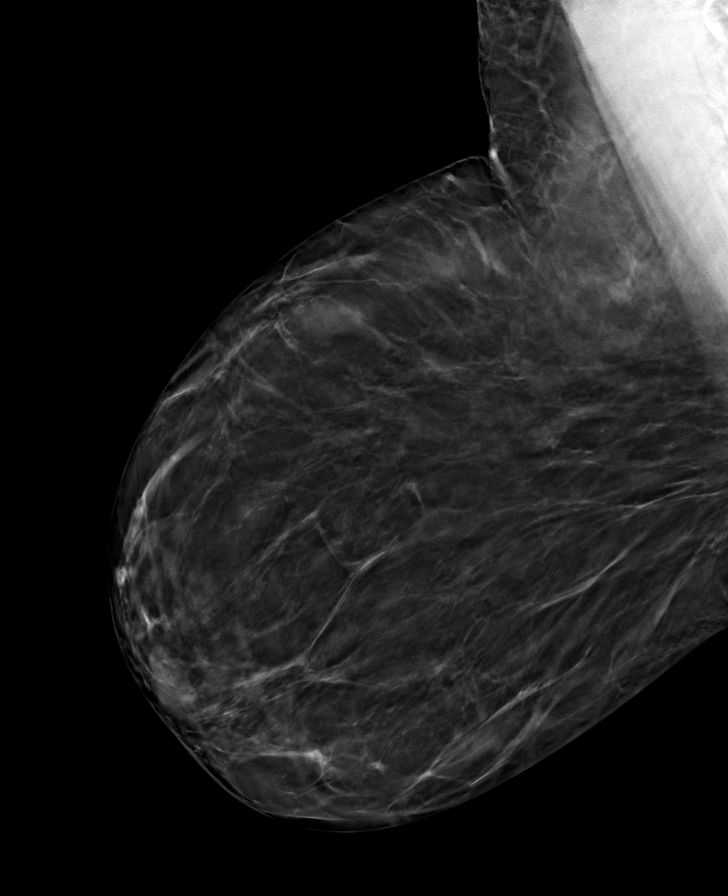

[L MLO tomo · tomo slice 44/87.0]
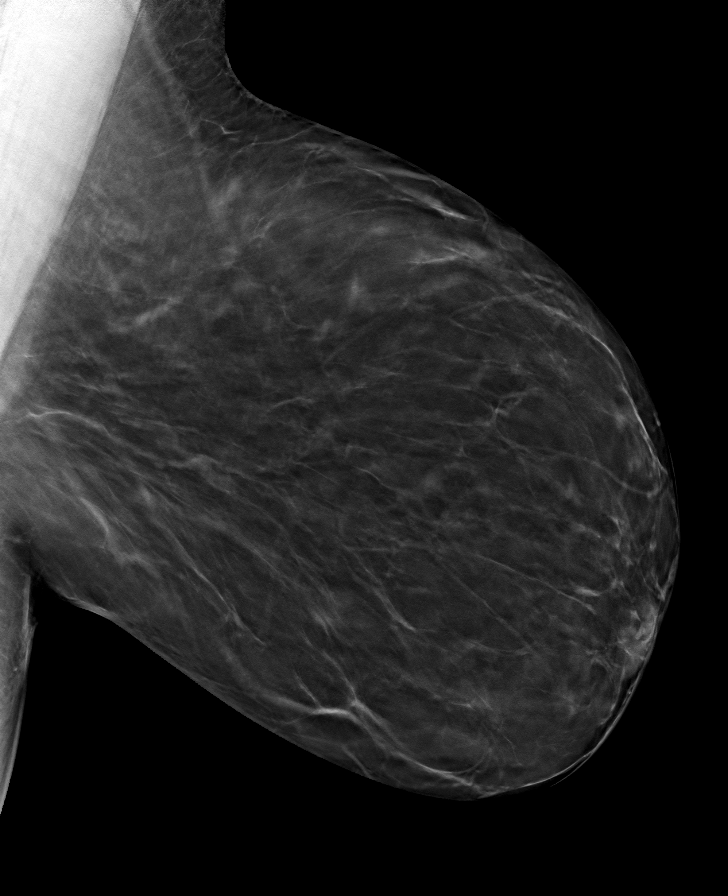

[L CC tomo · tomo slice 38/75.0]
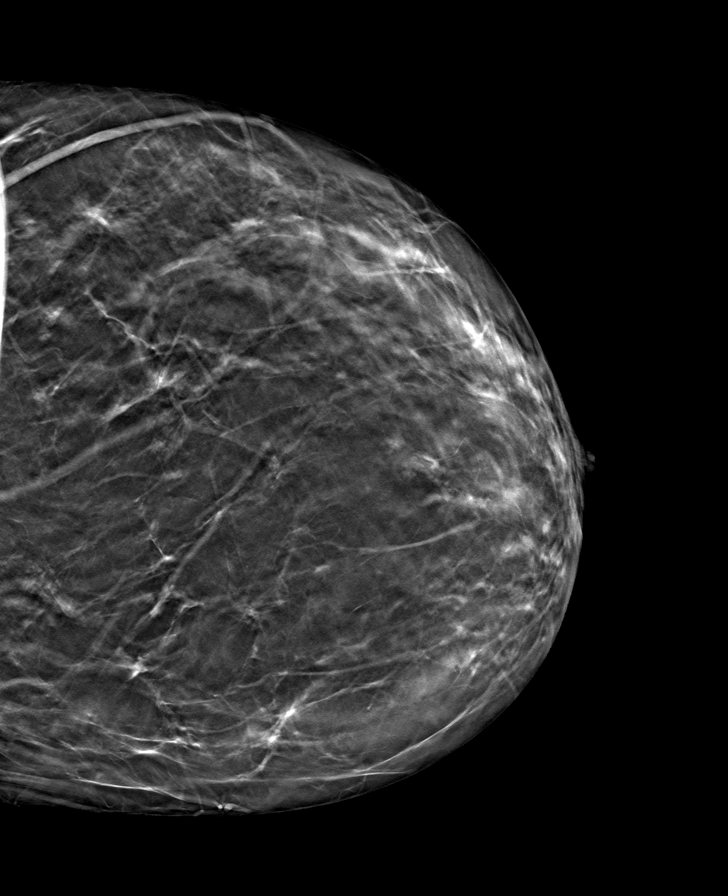

[R CC tomo · tomo slice 39/77.0]
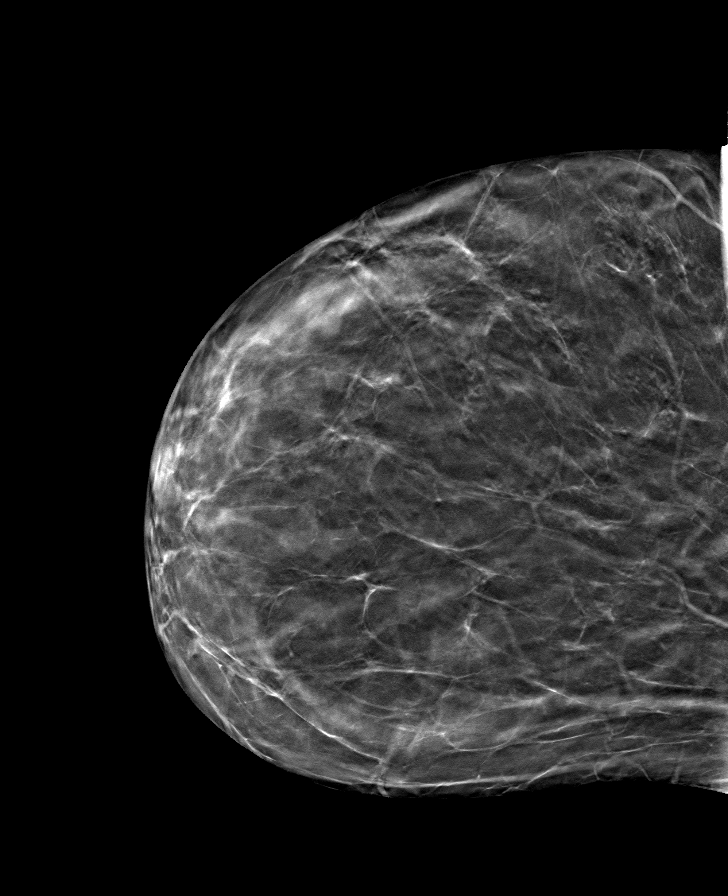

[8 of 24 positions shown; findings below may reference images not displayed]

ACR Breast Density Category b: There are scattered areas of
fibroglandular density.
FINDINGS: There are no findings suspicious for malignancy.
IMPRESSION: No mammographic evidence of malignancy. A result letter of this
screening mammogram will be mailed directly to the patient.

RECOMMENDATION:
Screening mammogram in one year. (Code:JT-Y-M4D)

BI-RADS CATEGORY  1: Negative.

ADDENDUM:
Comparison is made with multiple prior studies.

*** End of Addendum ***
ACR Breast Density Category b: There are scattered areas of
fibroglandular density.
FINDINGS: There are no findings suspicious for malignancy.
IMPRESSION: No mammographic evidence of malignancy. A result letter of this
screening mammogram will be mailed directly to the patient.

RECOMMENDATION:
Screening mammogram in one year. (Code:JT-Y-M4D)

BI-RADS CATEGORY  1: Negative.

## 2022-11-02 ENCOUNTER — Other Ambulatory Visit: Payer: Self-pay

## 2022-11-02 ENCOUNTER — Telehealth: Payer: Self-pay | Admitting: Neurology

## 2022-11-02 ENCOUNTER — Other Ambulatory Visit: Payer: Self-pay | Admitting: Neurology

## 2022-11-02 DIAGNOSIS — G35 Multiple sclerosis: Secondary | ICD-10-CM

## 2022-11-02 MED ORDER — TAMSULOSIN HCL 0.4 MG PO CAPS: 0.4000 mg | ORAL_CAPSULE | Freq: Every day | ORAL | 5 refills | Status: DC

## 2022-11-02 MED ORDER — TERIFLUNOMIDE 14 MG PO TABS
1.0000 | ORAL_TABLET | Freq: Every day | ORAL | 4 refills | Status: DC
Start: 2022-11-02 — End: 2023-05-11

## 2022-11-02 MED ORDER — AMPHETAMINE-DEXTROAMPHET ER 20 MG PO CP24: ORAL_CAPSULE | ORAL | 0 refills | Status: DC

## 2022-11-02 NOTE — Telephone Encounter (Signed)
Pt is requesting a refill on tamsulosin (FLOMAX) 0.4 MG CAPS capsule to [facility] [phone]

## 2022-11-02 NOTE — Telephone Encounter (Signed)
Last seen on 08/18/22 Follow up scheduled on 02/07/23 Last filled on 09/15/22 #30 tablets (30 day supply) Rx pending to be signed

## 2022-11-02 NOTE — Telephone Encounter (Signed)
Requested Prescriptions   Pending Prescriptions Disp Refills   tamsulosin (FLOMAX) 0.4 MG CAPS capsule 30 capsule 5    Sig: Take 1 capsule (0.4 mg total) by mouth daily.  'REFILL SENT DR SATER,  SHOULD WE TRY TO FIND THEM A PCP? YOU SHOW UP AS THAT:

## 2022-11-20 ENCOUNTER — Encounter (HOSPITAL_COMMUNITY): Payer: Self-pay

## 2022-11-20 ENCOUNTER — Other Ambulatory Visit: Payer: Self-pay | Admitting: Neurology

## 2022-11-20 ENCOUNTER — Other Ambulatory Visit: Payer: Self-pay

## 2022-11-20 ENCOUNTER — Emergency Department (HOSPITAL_COMMUNITY)
Admission: EM | Admit: 2022-11-20 | Discharge: 2022-11-20 | Discharge status: Against Medical Advice | Payer: Medicare HMO | Attending: Emergency Medicine | Admitting: Emergency Medicine

## 2022-11-20 DIAGNOSIS — W19XXXA Unspecified fall, initial encounter: Secondary | ICD-10-CM | POA: Insufficient documentation

## 2022-11-20 DIAGNOSIS — M25562 Pain in left knee: Secondary | ICD-10-CM | POA: Diagnosis present

## 2022-11-20 DIAGNOSIS — Z5321 Procedure and treatment not carried out due to patient leaving prior to being seen by health care provider: Secondary | ICD-10-CM | POA: Insufficient documentation

## 2022-11-20 DIAGNOSIS — M549 Dorsalgia, unspecified: Secondary | ICD-10-CM | POA: Insufficient documentation

## 2022-11-20 NOTE — ED Triage Notes (Signed)
BIBA for 3 falls since 10am this morning. Hx of MS. Reports she is having a flare up. L knee and back pain.

## 2022-11-21 ENCOUNTER — Telehealth: Payer: Self-pay | Admitting: Neurology

## 2022-11-21 ENCOUNTER — Other Ambulatory Visit: Payer: Self-pay

## 2022-11-21 MED ORDER — PREDNISONE 10 MG PO TABS: ORAL_TABLET | ORAL | 0 refills | Status: DC

## 2022-11-21 NOTE — Telephone Encounter (Signed)
Last seen on 08/18/22 Follow up scheduled on 02/07/23

## 2022-11-21 NOTE — Telephone Encounter (Signed)
I called pt after she left message.   She has MS taking aubagio at this time.  (From her hx she has been on quite a # of treatments).  She had to call EMS yesterday as she was not able to lift her legs, L leg predominately using walker.  Weakness main c/o.  They r/o stroke, they took her to ED, but due to 20 pts ahead of her she could not stay.  She called and stated that feels like it is a MS exacerbation, no vision, no urinary or bowel incontinenance. ;no s/s of an underlying infection.  She has been given steroids in past (medrol 6 day and 3 day high dose).  Please advise.

## 2022-11-21 NOTE — Telephone Encounter (Signed)
Pt is requesting a refill for baclofen (LIORESAL) 10 MG tablet , ELIQUIS 5 MG TABS tablet, tiZANidine (ZANAFLEX) 4 MG tablet, SUMAtriptan (IMITREX) 100 MG tablet,busPIRone (BUSPAR) 15 MG tablet, gabapentin (NEURONTIN) 800 MG tablet .  Pharmacy: [facility]

## 2022-11-21 NOTE — Telephone Encounter (Signed)
Pt went to ED on yesterday due to extreme left sided weakness and her legs giving out on her as well.  Pt was taken to ED but decided to leave due to how very busy it was, pt is asking for a call to discuss the episode.

## 2022-11-21 NOTE — Telephone Encounter (Signed)
Meds ordered this encounter  Medications   predniSONE (DELTASONE) 10 MG tablet    Sig: Take 60mg  on day 1. Reduce by 10mg  each subsequent day. (60, 50, 40, 30, 20, 10, stop)    Dispense:  21 tablet    Refill:  0    Suanne Marker, MD 11/21/2022, 5:04 PM Certified in Neurology, Neurophysiology and Neuroimaging  [facility] [address], [address] [place], [place] [address] [phone]

## 2022-11-22 NOTE — Telephone Encounter (Signed)
I called pt and relayed that Dr. Marjory Lies, [facility], prescribed for her prednisone medrol dosepack for her 6 day taper.  Relayed called to [facility].  Watch for SE, increased energy, appetite, insomnia most common.  Take with food.  She is to call if things worsen or do not get better.  She appreciated call back.

## 2022-11-28 ENCOUNTER — Telehealth: Payer: Self-pay | Admitting: Neurology

## 2022-11-28 NOTE — Telephone Encounter (Signed)
Pt said two days ago started having a continuous burning sensation on entire back. Hurts worse at night while lying down, back is really tight under shoulder blade.  Would like a call from the nurse to discuss what to do.

## 2022-11-28 NOTE — Telephone Encounter (Signed)
I called pt.  She was in line with pharmacy at [facility] for an ABX.  She had gone to [facility] urgent care and was diagnosed with urinary tract infection. She had been having urgency , falling, tingling and burning in her back.  She is on aubagio for MS.  I relayed for her to mychart what abx and how long she will be only.  I donot see in lab ([facility]) results.  Instructed to drink fluids, eat yogurt.  She appreciated call back.

## 2022-11-29 NOTE — Telephone Encounter (Signed)
I called pt and she is taking cephalexin 500mg  po bid for 7 days.

## 2022-12-06 ENCOUNTER — Telehealth: Payer: Self-pay | Admitting: Neurology

## 2022-12-06 DIAGNOSIS — R5383 Other fatigue: Secondary | ICD-10-CM

## 2022-12-06 DIAGNOSIS — G35 Multiple sclerosis: Secondary | ICD-10-CM

## 2022-12-06 DIAGNOSIS — M21372 Foot drop, left foot: Secondary | ICD-10-CM

## 2022-12-06 DIAGNOSIS — R269 Unspecified abnormalities of gait and mobility: Secondary | ICD-10-CM

## 2022-12-06 DIAGNOSIS — R531 Weakness: Secondary | ICD-10-CM

## 2022-12-06 NOTE — Telephone Encounter (Signed)
Pt states she has been waiting months to be contacted about restarting PT, she has not heard from anyone, but would very much like to restart

## 2022-12-06 NOTE — Telephone Encounter (Signed)
Spoke w/ pt. She wants outpt PT at [facility] neuro-rehab next [place] to [place]. I placed referral.

## 2022-12-06 NOTE — Telephone Encounter (Signed)
Phone room: please call back and ask if she wants home health PT or would like to go to outpatient PT. If so, what company/place?

## 2022-12-06 NOTE — Telephone Encounter (Signed)
Dr. 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 Will consider Aubagio after lymphocytes increase as should have less effect on cell counts" "  Do you recommend she stay on teriflunomide given this history?

## 2022-12-06 NOTE — Telephone Encounter (Signed)
Pt is asking if she can now go back on Tysabri, please call to discuss.

## 2022-12-06 NOTE — Telephone Encounter (Signed)
Called pt. Relayed Dr. Bonnita Hollow message. She verbalized understanding. She feels ambulation/gait has worsened since being on teriflunomide. Has to now use cane/walker whereas before on Tysabri, she did not. She felt she had more energy on Tysabri. I explained risks of going back on Tysabri. She wants to know if there are other options.   She fells two Sundays ago, denies any injuries. Followed-up with PCP and had UTI, placed on antibiotics and it cleared up. Has been falling more since last visit.   Also wondering if she needs repeat MRI. She also wanted referral to PT/outpt. I placed referral. Aware I will send to MD to review and will call back tomorrow.   She is also requesting order for L AFO. She is willing to come in for sooner appt if needed.

## 2022-12-07 NOTE — Telephone Encounter (Signed)
Called pt. Offered appt below. She accepted. I scheduled and asked she check in at 9:00am. She verbalized understanding.

## 2022-12-07 NOTE — Telephone Encounter (Signed)
Called and LVM for pt offering 12/14/22 at 9:30am with Dr. Epimenio Foot. Asked her to call back and let us know if this appt works and then we can schedule. (Phone room please relay message and offer if she calls)

## 2022-12-07 NOTE — Telephone Encounter (Signed)
Noted  

## 2022-12-10 ENCOUNTER — Inpatient Hospital Stay: Admission: RE | Admit: 2022-12-10 | Payer: Medicare HMO | Source: Ambulatory Visit

## 2022-12-12 ENCOUNTER — Other Ambulatory Visit: Payer: Self-pay | Admitting: Neurology

## 2022-12-12 NOTE — Telephone Encounter (Signed)
Last seen on 08/18/22 Follow up scheduled on 12/14/22

## 2022-12-14 ENCOUNTER — Ambulatory Visit: Payer: Medicare HMO | Attending: Neurology | Admitting: Physical Therapy

## 2022-12-14 ENCOUNTER — Ambulatory Visit: Payer: Medicare HMO | Admitting: Neurology

## 2022-12-14 ENCOUNTER — Encounter: Payer: Self-pay | Admitting: Neurology

## 2022-12-14 DIAGNOSIS — M6281 Muscle weakness (generalized): Secondary | ICD-10-CM | POA: Insufficient documentation

## 2022-12-14 DIAGNOSIS — R29818 Other symptoms and signs involving the nervous system: Secondary | ICD-10-CM | POA: Insufficient documentation

## 2022-12-14 DIAGNOSIS — R2689 Other abnormalities of gait and mobility: Secondary | ICD-10-CM | POA: Insufficient documentation

## 2022-12-14 DIAGNOSIS — M5459 Other low back pain: Secondary | ICD-10-CM | POA: Insufficient documentation

## 2022-12-21 ENCOUNTER — Ambulatory Visit: Payer: Medicare HMO | Admitting: Physical Therapy

## 2022-12-27 ENCOUNTER — Ambulatory Visit: Payer: Medicare HMO | Admitting: Physical Therapy

## 2022-12-27 ENCOUNTER — Encounter: Payer: Self-pay | Admitting: Physical Therapy

## 2022-12-27 DIAGNOSIS — M6281 Muscle weakness (generalized): Secondary | ICD-10-CM | POA: Diagnosis not present

## 2022-12-27 DIAGNOSIS — R29818 Other symptoms and signs involving the nervous system: Secondary | ICD-10-CM | POA: Diagnosis not present

## 2022-12-27 DIAGNOSIS — R2689 Other abnormalities of gait and mobility: Secondary | ICD-10-CM

## 2022-12-27 DIAGNOSIS — M5459 Other low back pain: Secondary | ICD-10-CM | POA: Diagnosis not present

## 2022-12-27 NOTE — Therapy (Unsigned)
OUTPATIENT PHYSICAL THERAPY NEURO EVALUATION   Patient Name: Dawn Peters MRN: [id number] DOB:1980/03/14, 42 y.o., female Today's Date: 12/28/2022   PCP: Asa Lente, MD REFERRING PROVIDER: Asa Lente, MD  END OF SESSION:  PT End of Session - 12/28/22 0702     Visit Number 1    Number of Visits 17    Date for PT Re-Evaluation 02/24/23    Authorization Type [employer]    Authorization Time Period 12-28-22 - 03-07-23    PT Start Time 1405    PT Stop Time 1447    PT Time Calculation (min) 42 min    Equipment Utilized During Treatment Other (comment)   RW   Activity Tolerance Patient tolerated treatment well    Behavior During Therapy WFL for tasks assessed/performed             Past Medical History:  Diagnosis Date   Anticoagulant long-term use    Eliquis   Anxiety    Chronic pain    CKD (chronic kidney disease), stage III (HCC)    Depression    DVT, lower extremity, 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   left lower extremity-- treated with eliquis   Gait disturbance    due to MS   GERD (gastroesophageal reflux disease)    watch diet   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/p  THA   History of DVT of lower extremity 2007   History of encephalopathy 04/2017   acute metabolic encephalopathy secondary to MS,  resolved   History of MRSA infection 2004   right hip infection post THA   History of pulmonary embolus (PE) 2005   Hydronephrosis, left    w/ acute kidney injury 02/ 2019   Hypertension    Left-sided weakness    due to MS   Migraines    MS (multiple sclerosis) [facility]) neurologist-  dr Sherilyn Dacosta   dx 2000--   Muscle spasticity    Neurogenic bladder    due to MS   Pulmonary embolism, bilateral (HCC) 09/17/2017   treated w/ eliquis   Strains to urinate    Urgency of urination    Past Surgical History:  Procedure Laterality Date   CYSTOSCOPY WITH RETROGRADE PYELOGRAM, URETEROSCOPY  AND STENT PLACEMENT Bilateral 11/29/2017   Procedure: BILATERAL  RETROGRADE PYELOGRAM, LEFT DIAGNOSIC URETEROSCOPY AND STENT LEFT PLACEMENT;  Surgeon: Crist Fat, MD;  Location: [facility];  Service: Urology;  Laterality: Bilateral;   HIP ARTHROSCOPY Left 07-05-2013   dr pill @[facility]    iliopsosas release, synovectomy   REVISION TOTAL HIP ARTHROPLASTY Right 03-28-2003    dr Charlann Boxer @[facility]    TOTAL HIP ARTHROPLASTY Bilateral left 11-05-2002  dr Charlann Boxer @[facility] ;   right 06/ 2004  @[facility]    Patient Active Problem List   Diagnosis Date Noted   Generalized weakness 04/19/2021   GERD (gastroesophageal reflux disease)    AKI (acute kidney injury) (HCC) 02/24/2021   HLD (hyperlipidemia) 02/24/2021   Hypoproliferative anemia (HCC) 02/09/2021   Folic acid deficiency 02/09/2021   Acute on chronic anemia 02/08/2021   Symptomatic anemia 11/21/2020   Amenorrhea 11/20/2018   Well woman exam 11/20/2018   MS (multiple sclerosis) (HCC) 03/14/2018   Stage 3a chronic kidney disease (CKD) (HCC)    Obesity (BMI 30.0-34.9)    Anemia 01/09/2018   Pulmonary embolism (HCC) 09/18/2017   Elevated serum creatinine 05/12/2017   Acute encephalopathy 04/20/2017   Hydronephrosis of left kidney  10/24/2016   Attention deficit 10/24/2016   Left leg weakness 08/13/2016   Chronic pain 08/13/2016   Mild renal insufficiency 08/13/2016   Left-sided weakness    High risk medication use 09/15/2015   Hip pain, bilateral 07/28/2015   Urinary hesitancy 07/28/2015   Multiple sclerosis exacerbation (HCC) 07/17/2015   Urinary disorder 06/11/2015   Gait disturbance 05/19/2015   Numbness 05/19/2015   Depression with anxiety 05/13/2015   Other fatigue 05/13/2015   Left knee pain 05/13/2015   Avascular necrosis of bones of both hips (HCC) 05/13/2015   Screening for HIV (human immunodeficiency virus) 07/08/2014   Essential hypertension, benign 11/19/2013   Anxiety state 11/19/2013   Screen for STD (sexually  transmitted disease) 11/01/2013   Encounter for routine gynecological examination 11/01/2013   Oral contraceptive use 10/20/2011   Multiple sclerosis (HCC) 08/08/2007   RASH AND OTHER NONSPECIFIC SKIN ERUPTION 06/22/2007   AVASCULAR NECROSIS, FEMORAL HEAD 03/11/2006   Essential hypertension 02/03/2006   MICROALBUMINURIA 02/03/2006   DVT, HX OF 02/03/2006    ONSET DATE: 11-20-22 (exacerbation of MS)  REFERRING DIAG:  Diagnosis  G35 (ICD-10-CM) - MS (multiple sclerosis) (HCC)  M21.372 (ICD-10-CM) - Left foot drop  R26.9 (ICD-10-CM) - Gait disturbance  R53.1 (ICD-10-CM) - Left-sided weakness  R53.83 (ICD-10-CM) - Other fatigue    THERAPY DIAG:  Other abnormalities of gait and mobility  Muscle weakness (generalized)  Other symptoms and signs involving the nervous system  Other low back pain  Rationale for Evaluation and Treatment: Rehabilitation  SUBJECTIVE:                                                                                                                                                                                             SUBJECTIVE STATEMENT: Pt presents to PT eval ambulating with SPC with significant unsteadiness and heavy reliance on SPC.  Pt presented to [facility] ED on 11-20-22 with onset of increased LLE weakness and inability to lift leg.  Pt left the ED due to increased number of patients ahead of her.  Contacted Dr. Epimenio Foot who prescribed medication.  Pt reports she was diagnosed with MS in 2000.  Pt reports she started using SPC in 2023, has a rollator (says brakes do not work). Order is in chart from June for rollator but pt says she did not get it because she could not find a vendor that would file insurance for her and she could not pay out of pocket for it at that time.  Also is in need of a Lt AFO   Pt accompanied by:  son (waiting in lobby)  PERTINENT HISTORY:  MS (diagnosed in 2000), h/o chronic anemia, back pain, h/o bil. THA (2004), HTN,  depression  PAIN:  Are you having pain? Yes: NPRS scale: 8/10 Pain location: low back Pain description: throbbing, aching pain Aggravating factors: standing or walking Relieving factors: seated position, medication  PRECAUTIONS: Fall  RED FLAGS: None   WEIGHT BEARING RESTRICTIONS: No  FALLS: Has patient fallen in last 6 months? Yes. Number of falls 5+; 2 falls in past week - 1st one was outside, 2nd one - fell inside apartment  LIVING ENVIRONMENT: Lives with: lives with their family Lives in: House/apartment Stairs: Yes: External: 4 steps; can reach both Has following equipment at home: Single point cane and Walker - 2 wheeled  PLOF: Independent with basic ADLs, Independent with household mobility with device, Independent with community mobility with device, Independent with transfers, Requires assistive device for independence, and Needs assistance with homemaking  PATIENT GOALS: "get my strength back and reduce back pain"; get AFO for LLE  OBJECTIVE:  Note: Objective measures were completed at Evaluation unless otherwise noted.  DIAGNOSTIC FINDINGS: No recent imaging completed  COGNITION: Overall cognitive status: Within functional limits for tasks assessed    SENSATION: Reports intermittent tingling in calves and feet  COORDINATION: Decreased in LLE due to weakness  POSTURE: rounded shoulders, forward head, increased thoracic kyphosis, and weight shift right  LOWER EXTREMITY ROM:   RLE WFL's; decreased LLE due to weakness   LOWER EXTREMITY MMT:    MMT Right Eval Left Eval  Hip flexion 4- 2-  Hip extension    Hip abduction    Hip adduction    Hip internal rotation    Hip external rotation    Knee flexion    Knee extension 5 4  Ankle dorsiflexion 5 3-  Ankle plantarflexion 4 2  Ankle inversion    Ankle eversion    (Blank rows = not tested)  BED MOBILITY:  Independent - must use hands to lift LLE onto bed  TRANSFERS: Assistive device utilized:  Single point cane and Walker - 4 wheeled  Sit to stand: SBA Stand to sit: SBA  STAIRS:  TBA  GAIT: Gait pattern: decreased stance time- Left, decreased hip/knee flexion- Left, decreased ankle dorsiflexion- Left, trunk flexed, and poor foot clearance- Left Distance walked: 21' Assistive device utilized: Environmental consultant - 2 wheeled Level of assistance: SBA Comments: had pt use one of clinic's  RW due to significant safety concerns/unsteadiness with use of her SPC  FUNCTIONAL TESTS:  5 times sit to stand: 30.75 secs without UE support from standard chair Timed up and go (TUG): 21.75 secs with RW 10 meter walk test: 18.71 secs = 1.75 ft/sec with RW  PATIENT SURVEYS:  N/A - dx is MS  TODAY'S TREATMENT:                                                                                                                              DATE: 12-27-22  PATIENT EDUCATION: Education details: eval results - discussed aquatic therapy Person educated: Patient Education method: Explanation Education comprehension: verbalized understanding  HOME EXERCISE PROGRAM: To be established  GOALS: Goals reviewed with patient? Yes  SHORT TERM GOALS: Target date: 01-27-23  Pt will improve 5x sit to stand score to </= 26 secs without UE support from standard chair to demo improved LLE strength. Baseline:  30.75 secs without UE support Goal status: INITIAL  2.  Pt will improve TUG score to </= 17 secs with use of RW to reduce fall risk. Baseline: 21.75 secs with RW Goal status: INITIAL  3.  Initiate process for obtaining AFO for LLE. Baseline:  Goal status: INITIAL  4.  Increase gait velocity to >/= 2.1 ft/sec with RW for increased gait efficiency. Baseline: 18.71 secs with RW = 1.75 ft/sec with RW Goal status: INITIAL  5.  Participate in trial of TENS unit for chronic LBP - pt will report decreased back pain to </= 6/10 with use of unit. Baseline: 8/10 on 12-27-22 without use of TENS Goal status:  INITIAL  6.  Pt will ambulate 230' with AFO on LLE with RW with SBA. Baseline:  Goal status: INITIAL   LONG TERM GOALS: Target date: 02-24-23  Pt will improve 5x sit to stand score to </= 22 secs without UE support from standard chair to demo improved LLE strength. Baseline:  30.75 secs without UE support Goal status: INITIAL   2.  Pt will improve TUG score to </= 14 secs with use of RW to reduce fall risk. Baseline: 21.75 secs with RW Goal status: INITIAL  3.  Pt will independently don and doff AFO on LLE. Baseline:  Goal status: INITIAL  4.  Increase gait velocity to >/= 2.5 ft/sec with RW for increased gait efficiency. Baseline: 18.71 (1.75 ft/sec)  with RW Goal status: INITIAL  5.  Pt will report decreased LBP to </= 4/10 for increased comfort and ease with mobility & ADL's. Baseline: 8/10 Goal status: INITIAL  6.  Pt will ambulate 350' with AFO on LLE with RW with SBA. Baseline:  Goal status: INITIAL  7.  Independent in HEP for LLE strengthening, balance and low back stretching.             Baseline:  Goal status:  INITIAL  ASSESSMENT:  CLINICAL IMPRESSION: Patient is a 42 y.o. lady who was seen today for physical therapy evaluation and treatment for LLE weakness and gait and balance impairments due to MS.  Pt presents to initial eval ambulating with use of SPC with heavy reliance, posture deviations and unsteadiness.  Pt has LLE weakness including Lt foot drop, with need for AFO.  Pt is at high risk for fall per TUG score of 21.75 secs with use of clinic's RW, decreased gait velocity at 1.75 ft/sec with use of RW, and 5x sit to stand score of 30.75 secs without UE support from standard chair.  Pt also reports moderate to severe chronic LBP and would benefit from trial of TENS unit for assist with pain management.  Pt will benefit from PT to address LLE weakness, gait and balance impairments and to assist with obtaining AFO for LLE.  OBJECTIVE IMPAIRMENTS: Abnormal  gait, decreased activity tolerance, decreased balance, decreased coordination, decreased strength, impaired sensation, impaired tone, and pain.   ACTIVITY LIMITATIONS: bending, standing, squatting, stairs, transfers, and locomotion level  PARTICIPATION LIMITATIONS: meal prep, cleaning, laundry, shopping, and community activity  PERSONAL FACTORS: Past/current experiences and Time since onset  of injury/illness/exacerbation are also affecting patient's functional outcome.   REHAB POTENTIAL: Good  CLINICAL DECISION MAKING: Evolving/moderate complexity  EVALUATION COMPLEXITY: Moderate  PLAN:  PT FREQUENCY: 2x/week  PT DURATION: 8 weeks + eval  PLANNED INTERVENTIONS: 97110-Therapeutic exercises, 97530- Therapeutic activity, [id number]- Neuromuscular re-education, [phone]- Self Care, 95621- Gait training, [phone]- Orthotic Fit/training, and [phone]- Aquatic Therapy  PLAN FOR NEXT SESSION: trial of TENS for LBP:  gait training with AFO for LLE (toe off vs. Blue rocker); initiate HEP for LLE strengthening & stretching   Harrold Fitchett, Donavan Burnet, PT 12/28/2022, 7:29 AM

## 2022-12-28 ENCOUNTER — Other Ambulatory Visit: Payer: Self-pay | Admitting: Neurology

## 2022-12-28 ENCOUNTER — Encounter: Payer: Self-pay | Admitting: Physical Therapy

## 2023-01-03 ENCOUNTER — Telehealth: Payer: Self-pay | Admitting: Physical Therapy

## 2023-01-03 ENCOUNTER — Ambulatory Visit: Payer: Medicare HMO | Admitting: Physical Therapy

## 2023-01-03 NOTE — Telephone Encounter (Signed)
Called regarding no show appt to PT. Had to leave voicemail. Reminded pt of next appt.   Sherlie Ban, PT, DPT 01/03/23 2:40 PM   [facility] [address] [place], [place]  [address] Phone:  [phone] Fax:  [phone]

## 2023-01-05 ENCOUNTER — Encounter: Payer: Self-pay | Admitting: Physical Therapy

## 2023-01-05 ENCOUNTER — Telehealth: Payer: Self-pay | Admitting: Physical Therapy

## 2023-01-05 ENCOUNTER — Ambulatory Visit: Payer: Medicare HMO | Admitting: Physical Therapy

## 2023-01-05 NOTE — Telephone Encounter (Signed)
This is patient's 3rd no show to physical therapy this POC; PT had reminded from piror session of today's appointment. Therapist unable to leave patient a VM as VM box full but will D/C at this time given number of missed appointments per policy. Patient able to return to PT but will require new referral.  Maryruth Eve, PT, DPT

## 2023-01-05 NOTE — Therapy (Signed)
[facility] [address] [place], [place], [address] Phone: [phone]   Fax:  [phone]  Patient Details  Name: Deadra Garzon MRN: [id number] Date of Birth: [birth year] Referring Provider:  No ref. provider found  Encounter Date: 01/05/2023  PHYSICAL THERAPY DISCHARGE SUMMARY  Visits from Start of Care: 1  Current functional level related to goals / functional outcomes: Unable to assess; patient has not returned   Remaining deficits: Unable to assess; patient has not returned   Education / Equipment: Unable to assess; patient has not returned   Patient agrees to discharge. Patient goals were not met. Patient is being discharged due to not returning since the last visit.   See telephone encounters for details.   Carmelia Bake, PT, DPT 01/05/2023, 4:02 PM  [facility] [address] [place], [place], [address] Phone: [phone]   Fax:  [phone]

## 2023-01-06 ENCOUNTER — Emergency Department (HOSPITAL_COMMUNITY)
Admission: EM | Admit: 2023-01-06 | Discharge: 2023-01-07 | Disposition: A | Discharge status: Home / Self Care | Payer: Medicare HMO | Attending: Emergency Medicine | Admitting: Emergency Medicine

## 2023-01-06 ENCOUNTER — Other Ambulatory Visit: Payer: Self-pay

## 2023-01-06 ENCOUNTER — Emergency Department (HOSPITAL_COMMUNITY): Payer: Medicare HMO

## 2023-01-06 ENCOUNTER — Encounter (HOSPITAL_COMMUNITY): Payer: Self-pay | Admitting: Emergency Medicine

## 2023-01-06 DIAGNOSIS — I129 Hypertensive chronic kidney disease with stage 1 through stage 4 chronic kidney disease, or unspecified chronic kidney disease: Secondary | ICD-10-CM | POA: Insufficient documentation

## 2023-01-06 DIAGNOSIS — Z79899 Other long term (current) drug therapy: Secondary | ICD-10-CM | POA: Diagnosis not present

## 2023-01-06 DIAGNOSIS — R519 Headache, unspecified: Secondary | ICD-10-CM | POA: Insufficient documentation

## 2023-01-06 DIAGNOSIS — Z7901 Long term (current) use of anticoagulants: Secondary | ICD-10-CM | POA: Diagnosis not present

## 2023-01-06 DIAGNOSIS — N1831 Chronic kidney disease, stage 3a: Secondary | ICD-10-CM | POA: Insufficient documentation

## 2023-01-06 NOTE — ED Provider Notes (Signed)
[place] EMERGENCY DEPARTMENT AT [facility] Provider Note  CSN: [id number] Arrival date & time: 01/06/23 2044  Chief Complaint(s) Jaw Pain  HPI Dawn Peters is a 42 y.o. female with a past medical history listed below here for right-sided jaw and face pain. Ongoing for 2 weeks constantly with fluctuating intensities described as sharp electrical shocks going from the jaw to the rest of the face. Worse with swallowing and palpating the submandibular region. Denies any recent fevers or infections. Does not feel like it is dental related.   The history is provided by the patient.    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   left lower extremity-- treated with eliquis   Gait disturbance    due to MS   GERD (gastroesophageal reflux disease)    watch diet   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/p  THA   History of DVT of lower extremity 2007   History of encephalopathy 04/2017   acute metabolic encephalopathy secondary to MS,  resolved   History of MRSA infection 2004   right hip infection post THA   History of pulmonary embolus (PE) 2005   Hydronephrosis, left    w/ acute kidney injury 02/ 2019   Hypertension    Left-sided weakness    due to MS   Migraines    MS (multiple sclerosis) [facility]) neurologist-  dr Sherilyn Dacosta   dx 2000--   Muscle spasticity    Neurogenic bladder    due to MS   Pulmonary embolism, bilateral (HCC) 09/17/2017   treated w/ eliquis   Strains to urinate    Urgency of urination    Patient Active Problem List   Diagnosis Date Noted   Generalized weakness 04/19/2021   GERD (gastroesophageal reflux disease)    AKI (acute kidney injury) (HCC) 02/24/2021   HLD  (hyperlipidemia) 02/24/2021   Hypoproliferative anemia (HCC) 02/09/2021   Folic acid deficiency 02/09/2021   Acute on chronic anemia 02/08/2021   Symptomatic anemia 11/21/2020   Amenorrhea 11/20/2018   Well woman exam 11/20/2018   MS (multiple sclerosis) (HCC) 03/14/2018   Stage 3a chronic kidney disease (CKD) (HCC)    Obesity (BMI 30.0-34.9)    Anemia 01/09/2018   Pulmonary embolism (HCC) 09/18/2017   Elevated serum creatinine 05/12/2017   Acute encephalopathy 04/20/2017   Hydronephrosis of left kidney 10/24/2016   Attention deficit 10/24/2016   Left leg weakness 08/13/2016   Chronic pain 08/13/2016   Mild renal insufficiency 08/13/2016   Left-sided weakness    High risk medication use 09/15/2015   Hip pain, bilateral 07/28/2015   Urinary hesitancy 07/28/2015   Multiple sclerosis exacerbation (HCC) 07/17/2015   Urinary disorder 06/11/2015   Gait disturbance 05/19/2015   Numbness 05/19/2015   Depression with anxiety 05/13/2015   Other fatigue 05/13/2015   Left knee pain 05/13/2015   Avascular necrosis of bones of both hips (HCC) 05/13/2015   Screening for HIV (human immunodeficiency virus) 07/08/2014   Essential hypertension, benign 11/19/2013   Anxiety state 11/19/2013   Screen for STD (sexually transmitted disease) 11/01/2013   Encounter for routine gynecological examination 11/01/2013   Oral contraceptive use 10/20/2011   Multiple sclerosis (HCC)  08/08/2007   RASH AND OTHER NONSPECIFIC SKIN ERUPTION 06/22/2007   AVASCULAR NECROSIS, FEMORAL HEAD 03/11/2006   Essential hypertension 02/03/2006   MICROALBUMINURIA 02/03/2006   DVT, HX OF 02/03/2006   Home Medication(s) Prior to Admission medications   Medication Sig Start Date End Date Taking? Authorizing Provider  potassium chloride (KLOR-CON) 10 MEQ tablet Take 2 tablets (20 mEq total) by mouth daily. 01/07/23  Yes Markon Jares, Amadeo Garnet, MD  acetaminophen (TYLENOL) 500 MG tablet Take 1,000 mg by mouth every 6 (six)  hours as needed for moderate pain or headache.    [provider]  amantadine (SYMMETREL) 100 MG capsule Take 1 capsule (100 mg total) by mouth 2 (two) times daily. 08/18/22   Sater, Pearletha Furl, MD  amitriptyline (ELAVIL) 25 MG tablet TAKE 1 TO 2 TABLETS (25 MG TO 50 MG) BY MOUTH AT BEDTIME 08/23/22   Sater, Pearletha Furl, MD  amLODipine (NORVASC) 5 MG tablet Take 1 tablet (5 mg total) by mouth daily. Patient taking differently: Take 5 mg by mouth daily after breakfast. 05/21/17   Arrien, York Ram, MD  amphetamine-dextroamphetamine (ADDERALL XR) 20 MG 24 hr capsule TAKE ONE CAPSULE DAILY AT 9AM (VIAL) 11/02/22   Sater, Pearletha Furl, MD  atorvastatin (LIPITOR) 10 MG tablet Take 10 mg by mouth daily. 12/24/20   [provider]  baclofen (LIORESAL) 10 MG tablet TAKE ONE TABLET BY MOUTH FOUR TIMES DAILY @ 9AM, 1PM, 5PM, & 9PM 02/24/22   Sater, Pearletha Furl, MD  busPIRone (BUSPAR) 15 MG tablet TAKE ONE TABLET BY MOUTH TWICE DAILY @ 9am & 5pm 09/13/22   Sater, Pearletha Furl, MD  citalopram (CELEXA) 20 MG tablet TAKE ONE TABLET BY MOUTH DAILY AT 9AM 02/24/22   Sater, Pearletha Furl, MD  dalfampridine 10 MG TB12 TAKE ONE TABLET BY MOUTH TWICE DAILY @ 9AM-9PM 09/13/22   Sater, Pearletha Furl, MD  diphenhydrAMINE (BENADRYL) 25 mg capsule Take 25 mg by mouth every 6 (six) hours as needed for itching.    [provider]  doxycycline (VIBRAMYCIN) 100 MG capsule Take 1 capsule (100 mg total) by mouth 2 (two) times daily. 07/13/22   Jeannie Fend, PA-C  ELIQUIS 5 MG TABS tablet TAKE ONE TABLET BY MOUTH TWICE DAILY @ 9AM & 9PM 12/27/21   Sater, Pearletha Furl, MD  folic acid (FOLVITE) 1 MG tablet Take 1 tablet (1 mg total) by mouth daily. 04/19/21   Regalado, Belkys A, MD  gabapentin (NEURONTIN) 800 MG tablet TAKE ONE TABLET BY MOUTH FOUR TIMES DAILY @ 9AM-1PM-5PM-9PM 12/12/22   Sater, Pearletha Furl, MD  HYDROcodone bit-homatropine (HYCODAN) 5-1.5 MG/5ML syrup Take 5 mLs by mouth every 6 (six) hours as needed for cough.  04/07/22   Karie Mainland, Amjad, PA-C  JARDIANCE 10 MG TABS tablet Take 10 mg by mouth daily. 03/14/21   [provider]  LINZESS 72 MCG capsule Take 72 mcg by mouth daily as needed (constipation). 02/18/21   [provider]  methylPREDNISolone (MEDROL DOSEPAK) 4 MG TBPK tablet Take by mouth as directed. Take 6 tablets on day one, then decrease by one tablet daily until gone/fim 09/20/22   Sater, Pearletha Furl, MD  metoprolol succinate (TOPROL-XL) 50 MG 24 hr tablet Take 50 mg by mouth daily. 09/27/20   [provider]  Multiple Vitamin (MULTIVITAMIN WITH MINERALS) TABS tablet Take 1 tablet by mouth daily. 09/20/17   Darlin Drop, DO  norethindrone (MICRONOR) 0.35 MG tablet Take 1 tablet (0.35 mg total) by mouth daily. 11/19/18  Venora Maples, MD  omeprazole (PRILOSEC) 40 MG capsule Take 40 mg by mouth daily. 09/27/20   [provider]  oseltamivir (TAMIFLU) 75 MG capsule Take 1 capsule (75 mg total) by mouth every 12 (twelve) hours. 04/07/22   Karie Mainland, Amjad, PA-C  predniSONE (DELTASONE) 10 MG tablet Take 60mg  on day 1. Reduce by 10mg  each subsequent day. (60, 50, 40, 30, 20, 10, stop) 11/21/22   Penumalli, Glenford Bayley, MD  predniSONE (DELTASONE) 50 MG tablet 12 pills (600 mg) po daily x 3 days for MS exacerbation 01/12/22   Sater, Pearletha Furl, MD  SUMAtriptan (IMITREX) 100 MG tablet TAKE ONE TABLET BY MOUTH ONE TIME FOR UP TO ONE DOSE AS NEEDED FOR MIGRAINE. MAY REPEAT IN 2 HOURS IF HEADACHE PRESISTS OR RECURS. (ORIG) 01/19/22   Sater, Pearletha Furl, MD  tamsulosin (FLOMAX) 0.4 MG CAPS capsule Take 1 capsule (0.4 mg total) by mouth daily. 11/02/22   Sater, Pearletha Furl, MD  Teriflunomide 14 MG TABS Take 1 tablet (14 mg total) by mouth daily. 11/02/22   Sater, Pearletha Furl, MD  tiZANidine (ZANAFLEX) 4 MG tablet TAKE ONE TABLET BY MOUTH DAILY AT 9PM EVERY NIGHT AT BEDTIME 10/10/22   Sater, Pearletha Furl, MD  vitamin B-12 (CYANOCOBALAMIN) 1000 MCG tablet Take 1 tablet (1,000 mcg total) by mouth daily. 04/20/21    Regalado, Belkys A, MD                                                                                                                                    Allergies Ace inhibitors, Amoxicillin, and Lisinopril  Review of Systems Review of Systems As noted in HPI  Physical Exam Vital Signs  I have reviewed the triage vital signs BP (!) 170/118 (BP Location: Left Arm)   Pulse 89   Temp 98 F (36.7 C) (Oral)   Resp 16   SpO2 99%   Physical Exam Vitals reviewed.  Constitutional:      General: She is not in acute distress.    Appearance: She is well-developed. She is not diaphoretic.  HENT:     Head: Normocephalic and atraumatic.     Jaw: No swelling, pain on movement or malocclusion.     Salivary Glands: Right salivary gland is not diffusely enlarged. Left salivary gland is not diffusely enlarged.     Right Ear: External ear normal.     Left Ear: External ear normal.     Nose: Nose normal.     Mouth/Throat:     Lips: No lesions.     Mouth: No oral lesions.     Tongue: No lesions.     Palate: No mass and lesions.     Pharynx: No pharyngeal swelling, oropharyngeal exudate or posterior oropharyngeal erythema.     Tonsils: No tonsillar exudate or tonsillar abscesses.  Eyes:     General: No scleral icterus.    Conjunctiva/sclera: Conjunctivae normal.  Neck:  Trachea: Phonation normal.  Cardiovascular:     Rate and Rhythm: Normal rate and regular rhythm.  Pulmonary:     Effort: Pulmonary effort is normal. No respiratory distress.     Breath sounds: No stridor.  Abdominal:     General: There is no distension.  Musculoskeletal:        General: Normal range of motion.     Cervical back: Normal range of motion.  Lymphadenopathy:     Head:     Right side of head: Submandibular adenopathy present. No submental adenopathy.     Left side of head: No submental or submandibular adenopathy.     Cervical: No cervical adenopathy.     Upper Body:     Right upper body: No  supraclavicular or axillary adenopathy.     Left upper body: No supraclavicular or axillary adenopathy.     Lower Body: No right inguinal adenopathy. No left inguinal adenopathy.  Neurological:     Mental Status: She is alert and oriented to person, place, and time.  Psychiatric:        Behavior: Behavior normal.     ED Results and Treatments Labs (all labs ordered are listed, but only abnormal results are displayed) Labs Reviewed  CBC WITH DIFFERENTIAL/PLATELET - Abnormal; Notable for the following components:      Result Value   RDW 15.8 (*)    Neutro Abs 1.1 (*)    All other components within normal limits  COMPREHENSIVE METABOLIC PANEL - Abnormal; Notable for the following components:   Potassium 3.1 (*)    Creatinine, Ser 1.06 (*)    AST 53 (*)    All other components within normal limits  I-STAT CHEM 8, ED - Abnormal; Notable for the following components:   Potassium 3.1 (*)    Creatinine, Ser 1.30 (*)    Calcium, Ion 0.93 (*)    All other components within normal limits                                                                                                                         EKG  EKG Interpretation Date/Time:  Friday January 06 2023 22:06:45 EDT Ventricular Rate:  98 PR Interval:  166 QRS Duration:  95 QT Interval:  351 QTC Calculation: 449 R Axis:   30  Text Interpretation: Sinus rhythm Borderline T abnormalities, anterior leads No acute changes Confirmed by Drema Pry [id number]) on 01/06/2023 11:02:45 PM       Radiology CT Soft Tissue Neck W Contrast  Result Date: 01/07/2023 CLINICAL DATA:  Initial evaluation for soft tissue infection suspected, right-sided jaw pain. EXAM: CT NECK WITH CONTRAST TECHNIQUE: Multidetector CT imaging of the neck was performed using the standard protocol following the bolus administration of intravenous contrast. RADIATION DOSE REDUCTION: This exam was performed according to the departmental dose-optimization program  which includes automated exposure control, adjustment of the mA and/or kV according to patient size and/or use of iterative reconstruction technique. CONTRAST:  80mL OMNIPAQUE IOHEXOL 300 MG/ML  SOLN COMPARISON:  None Available. FINDINGS: Pharynx and larynx: Oral cavity within normal limits. No soft tissue swelling or significant inflammatory changes seen about the mandible by CT. Oropharynx within normal limits. Parapharyngeal fat maintained. Nasopharynx normal. No retropharyngeal collection. Negative epiglottis. Hypopharynx and supraglottic larynx within normal limits. Glottis within normal limits. Subglottic airway clear. Salivary glands: Salivary glands including the parotid and submandibular glands are within normal limits. Thyroid: Normal. Lymph nodes: No enlarged or pathologic adenopathy within the neck. Vascular: Normal intravascular enhancement seen within the neck. Limited intracranial: Unremarkable. Visualized orbits: Unremarkable. Mastoids and visualized paranasal sinuses: Paranasal sinuses are largely clear. Mastoid air cells and middle ear cavities are well pneumatized and free of fluid. Skeleton: No discrete or worrisome osseous lesions. No abnormality about either temporomandibular joint. Few scattered dental caries noted. Upper chest: No other acute finding. Other: None. IMPRESSION: Normal CT of the neck. No acute inflammatory process. No findings to explain patient's symptoms identified. Electronically Signed   By: Rise Mu M.D.   On: 01/07/2023 01:16    Medications Ordered in ED Medications  iohexol (OMNIPAQUE) 300 MG/ML solution 80 mL (80 mLs Intravenous Contrast Given 01/07/23 0048)  potassium chloride SA (KLOR-CON M) CR tablet 40 mEq (40 mEq Oral Given 01/07/23 0150)   Procedures Procedures  (including critical care time) Medical Decision Making / ED Course   Medical Decision Making Amount and/or Complexity of Data Reviewed Labs: ordered. Decision-making details  documented in ED Course. Radiology: ordered and independent interpretation performed. Decision-making details documented in ED Course.  Risk Prescription drug management.    Right jaw/face pain workup and differential diagnoses listed below  Considering submandibular sialolithiasis, lymphadenitis, intrinsic muscular strain/spasm, trigeminal neuralgia.  No evidence of RPA or PTA.  Given her past medical history, will obtain CT scan to rule out any serious deep tissue infection versus neoplastic mass.  Patient is not having any dental pain concerning for odontogenic etiology.  Location is not concerning for temporal arteritis. Doubt acute vascular process.  CBC without leukocytosis or anemia.  CMP with mild hypokalemia at 3.1.  Previously at 4.0 6 months ago.  May be contributing to muscle spasm. CT soft tissue neck negative for any masses, lymphadenopathy or sialoadenitis.  Patient provided with oral potassium supplement.  Will DC with short course of potassium supplement.  Recommended treatment for muscle discomfort.  Will also do sour candy in case there is a small stone not visualized on imaging. If the symptoms persist despite this, recommend she follow-up closely with her neurologist to discuss trigeminal neuralgia.     Final Clinical Impression(s) / ED Diagnoses Final diagnoses:  Face pain   The patient appears reasonably screened and/or stabilized for discharge and I doubt any other medical condition or other [facility] requiring further screening, evaluation, or treatment in the ED at this time. I have discussed the findings, Dx and Tx plan with the patient/family who expressed understanding and agree(s) with the plan. Discharge instructions discussed at length. The patient/family was given strict return precautions who verbalized understanding of the instructions. No further questions at time of discharge.  Disposition: Discharge  Condition: Good  ED Discharge Orders          Ordered     potassium chloride (KLOR-CON) 10 MEQ tablet  Daily        01/07/23 0224             Follow Up: Asa Lente, MD [address] [place] [address]  [phone]  Call  to schedule an appointment for close follow up     This chart was dictated using voice recognition software.  Despite best efforts to proofread,  errors can occur which can change the documentation meaning.    Nira Conn, MD 01/07/23 Emeline Darling

## 2023-01-06 NOTE — ED Provider Triage Note (Signed)
Emergency Medicine Provider Triage Evaluation Note  Dawn Peters , a 41 y.o. female  was evaluated in triage.  Pt complains of jaw and neck pain. Pain with swallowing Pain in facial nerve distrubution v2/3, Cramping and tightening and night (not associated with chewing()  Review of Systems  Positive: Jaw pain Negative: fever  Physical Exam  BP (!) 115/112 (BP Location: Left Arm)   Pulse (!) 106   Temp 98.1 F (36.7 C) (Oral)   Resp 18   SpO2 99%  Gen:   Awake, no distress   Resp:  Normal effort  MSK:   Moves extremities without difficulty  Other:  Oropharynx is clear and moist, no palpable mass in the pharynx.  Normal sensation bilateral face.  Some temporomandibular joint dysfunction.  Medical Decision Making  Medically screening exam initiated at 9:49 PM.  Appropriate orders placed.  Dawn Peters was informed that the remainder of the evaluation will be completed by another provider, this initial triage assessment does not replace that evaluation, and the importance of remaining in the ED until their evaluation is complete.  Patient with JP jaw pain.  Difficult to assess.  He has pain having pain with swelling.  CT soft tissue's of the neck should show [place] the temporomandibular joint and should also allow [place] to evaluate for any soft tissue infections or masses of the neck.  Patient symptoms also seem consistent with either TMJ dysfunction or trigeminal neuralgia.  Patient may also have facial dystonia low suspicion for dental infection.  Oropharynx clear moist, no concern for temporal arteritis   Arthor Captain, PA-C 01/06/23 2200

## 2023-01-06 NOTE — ED Triage Notes (Signed)
Pt reports right sided jaw pain x 2 weeks. Pt reports it hurts to talk. Pt also states she cannot drink out of straw. Pt states it doesn't feel like a toothache but more so muscle pain.

## 2023-01-07 ENCOUNTER — Emergency Department (HOSPITAL_COMMUNITY): Payer: Medicare HMO

## 2023-01-07 DIAGNOSIS — R519 Headache, unspecified: Secondary | ICD-10-CM | POA: Diagnosis not present

## 2023-01-07 LAB — COMPREHENSIVE METABOLIC PANEL
ALT: 43 U/L (ref 0–44)
AST: 53 U/L — ABNORMAL HIGH (ref 15–41)
Albumin: 4.1 g/dL (ref 3.5–5.0)
Alkaline Phosphatase: 113 U/L (ref 38–126)
Anion gap: 11 (ref 5–15)
BUN: 12 mg/dL (ref 6–20)
CO2: 28 mmol/L (ref 22–32)
Calcium: 9.1 mg/dL (ref 8.9–10.3)
Chloride: 103 mmol/L (ref 98–111)
Creatinine, Ser: 1.06 mg/dL — ABNORMAL HIGH (ref 0.44–1.00)
GFR, Estimated: >60 mL/min (ref >60)
Glucose, Bld: 87 mg/dL (ref 70–99)
Potassium: 3.1 mmol/L — ABNORMAL LOW (ref 3.5–5.1)
Sodium: 142 mmol/L (ref 135–145)
Total Bilirubin: 1 mg/dL (ref 0.3–1.2)
Total Protein: 7.3 g/dL (ref 6.5–8.1)

## 2023-01-07 LAB — CBC WITH DIFFERENTIAL/PLATELET
Abs Immature Granulocytes: 0.02 10*3/uL (ref 0.00–0.07)
Basophils Absolute: 0.1 10*3/uL (ref 0.0–0.1)
Basophils Relative: 2 %
Eosinophils Absolute: 0.1 10*3/uL (ref 0.0–0.5)
Eosinophils Relative: 2 %
HCT: 41.6 % (ref 36.0–46.0)
Hemoglobin: 13.8 g/dL (ref 12.0–15.0)
Immature Granulocytes: 0 %
Lymphocytes Relative: 69 %
Lymphs Abs: 3.6 10*3/uL (ref 0.7–4.0)
MCH: 32.2 pg (ref 26.0–34.0)
MCHC: 33.2 g/dL (ref 30.0–36.0)
MCV: 97.2 fL (ref 80.0–100.0)
Monocytes Absolute: 0.4 10*3/uL (ref 0.1–1.0)
Monocytes Relative: 7 %
Neutro Abs: 1.1 10*3/uL — ABNORMAL LOW (ref 1.7–7.7)
Neutrophils Relative %: 20 %
Platelets: 258 10*3/uL (ref 150–400)
RBC: 4.28 MIL/uL (ref 3.87–5.11)
RDW: 15.8 % — ABNORMAL HIGH (ref 11.5–15.5)
WBC: 5.3 10*3/uL (ref 4.0–10.5)
nRBC: 0 % (ref 0.0–0.2)

## 2023-01-07 LAB — I-STAT CHEM 8, ED
BUN: 11 mg/dL (ref 6–20)
Calcium, Ion: 0.93 mmol/L — ABNORMAL LOW (ref 1.15–1.40)
Chloride: 102 mmol/L (ref 98–111)
Creatinine, Ser: 1.3 mg/dL — ABNORMAL HIGH (ref 0.44–1.00)
Glucose, Bld: 85 mg/dL (ref 70–99)
HCT: 42 % (ref 36.0–46.0)
Hemoglobin: 14.3 g/dL (ref 12.0–15.0)
Potassium: 3.1 mmol/L — ABNORMAL LOW (ref 3.5–5.1)
Sodium: 142 mmol/L (ref 135–145)
TCO2: 27 mmol/L (ref 22–32)

## 2023-01-07 MED ORDER — POTASSIUM CHLORIDE CRYS ER 20 MEQ PO TBCR
40.0000 meq | EXTENDED_RELEASE_TABLET | Freq: Once | ORAL | Status: AC
  Administered 2023-01-07: 40 meq via ORAL
  Filled 2023-01-07: qty 2

## 2023-01-07 MED ORDER — IOHEXOL 300 MG/ML  SOLN
80.0000 mL | Freq: Once | INTRAMUSCULAR | Status: AC | PRN
  Administered 2023-01-07: 80 mL via INTRAVENOUS

## 2023-01-07 MED ORDER — POTASSIUM CHLORIDE ER 10 MEQ PO TBCR: 20.0000 meq | EXTENDED_RELEASE_TABLET | Freq: Every day | ORAL | 0 refills | Status: DC

## 2023-01-09 ENCOUNTER — Other Ambulatory Visit: Payer: Self-pay | Admitting: Neurology

## 2023-01-10 ENCOUNTER — Ambulatory Visit: Payer: Medicare HMO | Admitting: Physical Therapy

## 2023-01-11 MED ORDER — AMPHETAMINE-DEXTROAMPHET ER 20 MG PO CP24: ORAL_CAPSULE | ORAL | 0 refills | Status: DC

## 2023-01-11 NOTE — Telephone Encounter (Signed)
Patient called to check on this refill and I let her know the note below. She said that he was in the ER with a sharp pain down her spine and they advised her to see Dr. Epimenio Foot ASAP. She knows she is scheduled December 2 and I let her know what there are not currently any openings between now and then but we can let her know if there is an opening. I added her to the wait list too.

## 2023-01-11 NOTE — Telephone Encounter (Signed)
Last seen on 08/18/22 Follow up scheduled on 02/06/23 Last filled on 11/09/22 #30 tablets  Rx pending to be signed

## 2023-01-12 ENCOUNTER — Ambulatory Visit: Payer: Medicare HMO | Admitting: Physical Therapy

## 2023-01-16 ENCOUNTER — Other Ambulatory Visit: Payer: Self-pay | Admitting: Neurology

## 2023-01-16 NOTE — Telephone Encounter (Signed)
Last seen on 08/18/22 Follow up scheduled on 02/06/23   Dr.Sater I didn't see if patient was to continue Rx at recent visit on 08/18/22.  Rx was written in 12/2021  Rx pending for approval or denial

## 2023-01-17 ENCOUNTER — Ambulatory Visit: Payer: Medicare HMO | Admitting: Physical Therapy

## 2023-01-19 ENCOUNTER — Ambulatory Visit: Payer: Medicare HMO | Admitting: Physical Therapy

## 2023-01-24 ENCOUNTER — Ambulatory Visit: Payer: Medicare HMO | Admitting: Physical Therapy

## 2023-01-26 ENCOUNTER — Ambulatory Visit: Payer: Medicare HMO | Admitting: Physical Therapy

## 2023-01-27 ENCOUNTER — Other Ambulatory Visit: Payer: Self-pay | Admitting: Neurology

## 2023-01-30 ENCOUNTER — Encounter: Payer: Self-pay | Admitting: Oncology

## 2023-01-30 NOTE — Telephone Encounter (Signed)
Last seen on 08/18/22 Follow up scheduled on 02/06/23

## 2023-01-31 ENCOUNTER — Telehealth: Payer: Self-pay | Admitting: Neurology

## 2023-01-31 ENCOUNTER — Ambulatory Visit: Payer: Medicare HMO | Admitting: Physical Therapy

## 2023-01-31 NOTE — Telephone Encounter (Signed)
MYC confirmation

## 2023-02-06 ENCOUNTER — Encounter: Payer: Self-pay | Admitting: Neurology

## 2023-02-06 ENCOUNTER — Ambulatory Visit: Payer: Medicare HMO | Admitting: Neurology

## 2023-02-07 ENCOUNTER — Ambulatory Visit: Payer: Medicare HMO | Admitting: Neurology

## 2023-02-07 ENCOUNTER — Ambulatory Visit: Payer: Medicare HMO | Admitting: Physical Therapy

## 2023-02-14 ENCOUNTER — Ambulatory Visit: Payer: Medicare HMO | Admitting: Physical Therapy

## 2023-02-14 ENCOUNTER — Encounter: Payer: Self-pay | Admitting: Oncology

## 2023-02-16 ENCOUNTER — Telehealth: Payer: Self-pay | Admitting: Neurology

## 2023-02-16 MED ORDER — PREDNISONE 50 MG PO TABS: ORAL_TABLET | ORAL | 0 refills | Status: DC

## 2023-02-16 NOTE — Telephone Encounter (Signed)
Pt is asking for a RN to call her re: a request for  predniSONE, as a result of her falling about 3 times today, please call.

## 2023-02-16 NOTE — Telephone Encounter (Signed)
Spoke with the patient.  She states she has had 3 falls today.  Her legs are weak.  She denies any other symptoms.  She states this started 3 days ago.  When she fell, she had to sit for 20 minutes before she could get up and walk.  She states she fell while walking out of her room and she did "not hit anything" and she denies any injuries. I did encourage her to go to urgent care for evaluation to be on the safe side due to the falls.  She is asking for prednisone as she has had before.  I spoke with Dr. Epimenio Foot and he gave verbal order to reorder prednisone 50 mg tablet taking 12 tablets/day for 3 days.  I called the patient back and let her know. I discussed the instructions with her and advised to make sure she takes the medication with food.  She also confirms she is not on any other steroids or anti-inflammatories right now and I have asked her not any while she is on prednisone.  She verbalized understanding and appreciation for the call.

## 2023-02-17 ENCOUNTER — Other Ambulatory Visit: Payer: Self-pay | Admitting: Neurology

## 2023-02-20 NOTE — Telephone Encounter (Signed)
Spoke with pt on 12/12 and this has been handled.

## 2023-02-20 NOTE — Telephone Encounter (Signed)
Last seen on 08/18/22 Follow up scheduled on 04/10/23

## 2023-02-21 ENCOUNTER — Ambulatory Visit: Payer: Medicare HMO | Admitting: Physical Therapy

## 2023-02-23 ENCOUNTER — Encounter: Payer: Self-pay | Admitting: Oncology

## 2023-02-23 ENCOUNTER — Other Ambulatory Visit: Payer: Self-pay | Admitting: Neurology

## 2023-02-27 ENCOUNTER — Other Ambulatory Visit: Payer: Self-pay | Admitting: Neurology

## 2023-02-27 MED ORDER — AMPHETAMINE-DEXTROAMPHET ER 20 MG PO CP24: ORAL_CAPSULE | ORAL | 0 refills | Status: DC

## 2023-02-27 NOTE — Telephone Encounter (Signed)
Rx refill ok per last office visit note.

## 2023-02-27 NOTE — Telephone Encounter (Signed)
Last seen on 08/18/22 Follow up scheduled on 04/10/23 Adderall last filled on 01/11/23 #30 tablets (30 day supply) Rx pending to be signed

## 2023-03-02 ENCOUNTER — Ambulatory Visit
Admission: RE | Admit: 2023-03-02 | Discharge: 2023-03-02 | Disposition: A | Discharge status: Home / Self Care | Payer: Medicare HMO | Source: Ambulatory Visit | Attending: Neurology | Admitting: Neurology

## 2023-03-02 ENCOUNTER — Ambulatory Visit: Payer: Medicare HMO | Admitting: Neurology

## 2023-03-02 ENCOUNTER — Encounter: Payer: Self-pay | Admitting: Oncology

## 2023-03-02 DIAGNOSIS — Z1231 Encounter for screening mammogram for malignant neoplasm of breast: Secondary | ICD-10-CM

## 2023-03-30 ENCOUNTER — Telehealth: Payer: Self-pay

## 2023-03-30 ENCOUNTER — Telehealth: Payer: Self-pay | Admitting: Neurology

## 2023-03-30 ENCOUNTER — Encounter: Payer: Self-pay | Admitting: Oncology

## 2023-03-30 NOTE — Telephone Encounter (Signed)
LVM and sent mychart msg informing pt of cancellation of 04/06/23 appt - MD in meeting

## 2023-03-30 NOTE — Telephone Encounter (Signed)
Faxed off Medical Clearance Form to [facility] [phone]

## 2023-04-04 ENCOUNTER — Telehealth: Payer: Self-pay | Admitting: Neurology

## 2023-04-04 NOTE — Telephone Encounter (Signed)
Appointment time confirmed

## 2023-04-06 ENCOUNTER — Telehealth: Payer: Self-pay | Admitting: Neurology

## 2023-04-06 ENCOUNTER — Ambulatory Visit: Payer: Self-pay | Admitting: Neurology

## 2023-04-06 NOTE — Telephone Encounter (Signed)
MYC confirmation

## 2023-04-10 ENCOUNTER — Ambulatory Visit (INDEPENDENT_AMBULATORY_CARE_PROVIDER_SITE_OTHER): Payer: Medicare HMO | Admitting: Neurology

## 2023-04-10 ENCOUNTER — Encounter: Payer: Self-pay | Admitting: Neurology

## 2023-04-10 VITALS — BP 155/102 | HR 123 | Ht 66.0 in | Wt 198.0 lb

## 2023-04-10 DIAGNOSIS — Z114 Encounter for screening for human immunodeficiency virus [HIV]: Secondary | ICD-10-CM

## 2023-04-10 DIAGNOSIS — Z79899 Other long term (current) drug therapy: Secondary | ICD-10-CM | POA: Diagnosis not present

## 2023-04-10 DIAGNOSIS — G35 Multiple sclerosis: Secondary | ICD-10-CM

## 2023-04-10 DIAGNOSIS — N183 Chronic kidney disease, stage 3 unspecified: Secondary | ICD-10-CM

## 2023-04-10 DIAGNOSIS — R531 Weakness: Secondary | ICD-10-CM

## 2023-04-10 DIAGNOSIS — R5383 Other fatigue: Secondary | ICD-10-CM

## 2023-04-10 DIAGNOSIS — R269 Unspecified abnormalities of gait and mobility: Secondary | ICD-10-CM

## 2023-04-10 DIAGNOSIS — M21372 Foot drop, left foot: Secondary | ICD-10-CM

## 2023-04-10 MED ORDER — TAMSULOSIN HCL 0.4 MG PO CAPS: 0.4000 mg | ORAL_CAPSULE | Freq: Every day | ORAL | 3 refills | Status: AC

## 2023-04-10 NOTE — Progress Notes (Addendum)
[facility]  PATIENT: Dawn Peters DOB: 30-Mar-1980  REFERRING DOCTOR OR PCP:  Dr. Orpah Cobb SOURCE: Patient, records in EMR, lab results and radiology reports in EMR, MRI images reviewed on PACS.  _________________________________   HISTORICAL  CHIEF COMPLAINT:  Chief Complaint  Patient presents with   Multiple Sclerosis    Rm 11 with son Pt is well, here to discuss MS treatment and getting AFO brace.      HISTORY OF PRESENT ILLNESS:  Dawn Peters is a 43 y.o. woman who was diagnosed with relapsing remitting MS in 2000.     Update 04/10/2023 She reports a spell with additional left-sided weakness.  She is currently on teriflunomide since January 2024.  Lymphocyte count had dropped to 4 on Zeposia.    Of note, she had been on [place] before that. Lymphocytes were back to normal in January 2024 (0.8) and we started teriflunomide.   Last year, she also had anemia and needed transfusions ( normocytic, normochromic and hypoproliferative component anemia not autoimmune).         Today she is in a wheelchair.  She is able to walk with a walker but feels she is worse than the previous visit.  She has had more progressive change in left leg over past few years.    Her strength always feels better if shegets an IV steroid and we discussed difference between relapses and fluctuations.       Dysesthesias on the left side of the body are better with gabapentin.  She denies any sleepiness.      She has urinary urgency and hesitancy.  She is on tamsulosin.   She sees urology.     She has stage 3 CKD and sees Nephrology at [facility] (last crt was 1.41  Vision is stable.    Her fatigue is about the same.  She sleeps well most nights.  Depression and anxiety are better with the citalopram and anxiety is better with BuSpar .   She is taking vitamin D for her deficiency.  She has 2-3 migraines a month, helped by sumatriptan.   She continues to report back pain and  left leg pain that radiates.  She was seeing  [facility] for [facility] Management annd would like a referral to a differntn provider.     MS history:   She was diagnosed with MS in 2000 after presenting with gait difficulties, numbness and headaches. An MRI of the brain was consistent with MS. She was then started on Betaseron. She had difficulty tolerating Betaseron and at some point switched to Novantrone. She took Novantrone every 3 months for a year or so. A little later, she was placed on Tysabri and she stayed on it for a couple of years. However, she was JCV-positive and switched off. She did well on Tysabri. She had been on Tecfidera since 2015. Her MRI from June 2015 showed that there had been no changes compared to an MRI from 2014.   However, she had an exacerbation March 2017.   The 08/07/2013 MRI of the brain shows multiple T2/FLAIR hyperintense foci located in the cerebellum, middle cerebellar peduncles, pons and in the periventricular white matter of the hemispheres in a pattern and configuration consistent with chronic demyelinating plaque associated with MS. There were no acute findings. There was no change compared to the previous MRI from 01/16/2013 that was also reviewed. She switched to [place] in July,2017/Aug 2018.   Due to progression  she was shortly on Ocrevus in 2020 and then switched to [facility] January 2021. Due to persistent low lymphocytes (even on qod dosing), Zeposia was dicontinued 07/01/2021   Physical Exam She is a well-developed well-nourished woman in no acute distress.  The head is normocephalic and atraumatic.  Sclera are anicteric.  Visible skin appears normal.  The neck has a good range of motion. She is alert and fully oriented with fluent speech and good attention, knowledge and memory.  Extraocular muscles are intact.  Facial strength is normal.  She appears to have normal strength in the arms.  Rapid alternating movements and finger-nose-finger are  performed well.   IMAGING:  MRI cervical spine 08/31/2020 showed ubtle T2 hyperintense signal within the cord and there were no acute findings.  MRI brain 08/31/2020 showed Unchanged distribution of white matter lesions in a pattern compatible with multiple sclerosis. No active demyelinating lesions.  REVIEW OF SYSTEMS: Constitutional: No fevers, chills, sweats, or change in appetite.   She has fatigue.  Some insomnia. Eyes: No visual changes, double vision, eye pain Ear, nose and throat: No hearing loss, ear pain, nasal congestion, sore throat Cardiovascular: No chest pain, palpitations Respiratory:  No shortness of breath at rest or with exertion.   No wheezes GastrointestinaI: No nausea, vomiting, diarrhea, abdominal pain, fecal incontinence Genitourinary:  No dysuria.   She has hesitancy. Sometimes she has difficulty emptying completely Flomax has helped urinary hesitancy. 1 x nocturia. Musculoskeletal: She notes pain in the left leg and some back pain Integumentary: No rash, pruritus, skin lesions Neurological: as above Psychiatric: Mild depression at this time.  Moderate anxiety Endocrine: No palpitations, diaphoresis, change in appetite, change in weigh or increased thirst Hematologic/Lymphatic:  No anemia, purpura, petechiae. Allergic/Immunologic: No itchy/runny eyes, nasal congestion, recent allergic reactions, rashes  ALLERGIES: Allergies  Allergen Reactions   Ace Inhibitors Swelling   Amoxicillin Itching    Did it involve swelling of the face/tongue/throat, SOB, or low BP? Yes Did it involve sudden or severe rash/hives, skin peeling, or any reaction on the inside of your mouth or nose? No Did you need to seek medical attention at a hospital or doctor's office? No When did it last happen?      unknown  If all above answers are "NO", may proceed with cephalosporin use.;   Lisinopril Swelling    Lower lip swelling    HOME MEDICATIONS:  Current Outpatient Medications:     acetaminophen (TYLENOL) 500 MG tablet, Take 1,000 mg by mouth every 6 (six) hours as needed for moderate pain or headache., Disp: , Rfl:    amantadine (SYMMETREL) 100 MG capsule, Take 1 capsule (100 mg total) by mouth 2 (two) times daily., Disp: 180 capsule, Rfl: 3   amitriptyline (ELAVIL) 25 MG tablet, TAKE 1 TO 2 TABLETS (25 MG TO 50 MG) BY MOUTH AT BEDTIME, Disp: 180 tablet, Rfl: 6   amLODipine (NORVASC) 5 MG tablet, Take 1 tablet (5 mg total) by mouth daily. (Patient taking differently: Take 5 mg by mouth daily after breakfast.), Disp: 90 tablet, Rfl: 0   amphetamine-dextroamphetamine (ADDERALL XR) 20 MG 24 hr capsule, TAKE ONE CAPSULE DAILY AT 9AM (VIAL), Disp: 30 capsule, Rfl: 0   atorvastatin (LIPITOR) 10 MG tablet, Take 10 mg by mouth daily., Disp: , Rfl:    baclofen (LIORESAL) 10 MG tablet, TAKE ONE TABLET BY MOUTH FOUR TIMES DAILY @9AM -1PM-5PM-9PM, Disp: 360 tablet, Rfl: 11   busPIRone (BUSPAR) 15 MG tablet, TAKE ONE TABLET BY MOUTH TWICE DAILY @  9AM & 5PM, Disp: 180 tablet, Rfl: 2   citalopram (CELEXA) 20 MG tablet, TAKE ONE TABLET BY MOUTH DAILY AT 9AM, Disp: 90 tablet, Rfl: 11   dalfampridine 10 MG TB12, TAKE ONE TABLET BY MOUTH TWICE DAILY @ 9AM & 9PM, Disp: 60 tablet, Rfl: 2   diphenhydrAMINE (BENADRYL) 25 mg capsule, Take 25 mg by mouth every 6 (six) hours as needed for itching., Disp: , Rfl:    doxycycline (VIBRAMYCIN) 100 MG capsule, Take 1 capsule (100 mg total) by mouth 2 (two) times daily., Disp: 20 capsule, Rfl: 0   ELIQUIS 5 MG TABS tablet, TAKE ONE TABLET BY MOUTH TWICE DAILY @ 9AM & 9PM, Disp: 60 tablet, Rfl: 15   folic acid (FOLVITE) 1 MG tablet, Take 1 tablet (1 mg total) by mouth daily., Disp: 30 tablet, Rfl: 1   gabapentin (NEURONTIN) 800 MG tablet, TAKE ONE TABLET BY MOUTH FOUR TIMES DAILY 9AM-1PM-5PM-9PM, Disp: 360 tablet, Rfl: 2   JARDIANCE 10 MG TABS tablet, Take 10 mg by mouth daily., Disp: , Rfl:    LINZESS 72 MCG capsule, Take 72 mcg by mouth daily as needed  (constipation)., Disp: , Rfl:    methylPREDNISolone (MEDROL DOSEPAK) 4 MG TBPK tablet, Take by mouth as directed. Take 6 tablets on day one, then decrease by one tablet daily until gone/fim, Disp: 21 tablet, Rfl: 0   metoprolol succinate (TOPROL-XL) 50 MG 24 hr tablet, Take 50 mg by mouth daily., Disp: , Rfl:    Multiple Vitamin (MULTIVITAMIN WITH MINERALS) TABS tablet, Take 1 tablet by mouth daily., Disp: 30 tablet, Rfl: 0   norethindrone (MICRONOR) 0.35 MG tablet, Take 1 tablet (0.35 mg total) by mouth daily., Disp: 1 Package, Rfl: 11   omeprazole (PRILOSEC) 40 MG capsule, Take 40 mg by mouth daily., Disp: , Rfl:    potassium chloride (KLOR-CON) 10 MEQ tablet, Take 2 tablets (20 mEq total) by mouth daily., Disp: 7 tablet, Rfl: 0   predniSONE (DELTASONE) 10 MG tablet, Take 60mg  on day 1. Reduce by 10mg  each subsequent day. (60, 50, 40, 30, 20, 10, stop), Disp: 21 tablet, Rfl: 0   predniSONE (DELTASONE) 50 MG tablet, 12 pills (600 mg) po daily x 3 days for MS exacerbation, Disp: 36 tablet, Rfl: 0   SUMAtriptan (IMITREX) 100 MG tablet, ORIG} TAKE ONE TABLET BY MOUTH ONE TIME FOR UP TO ONE DOSE AS NEEDED FOR MIGRAINE. MAY REPEAT IN 2 HOURS IF HEADACHE PRESISTS OR RECURS, Disp: 10 tablet, Rfl: 0   Teriflunomide 14 MG TABS, Take 1 tablet (14 mg total) by mouth daily., Disp: 30 tablet, Rfl: 4   tiZANidine (ZANAFLEX) 4 MG tablet, TAKE ONE TABLET BY MOUTH DAILY AT 9PM EVERY NIGHT AT BEDTIME, Disp: 30 tablet, Rfl: 5   vitamin B-12 (CYANOCOBALAMIN) 1000 MCG tablet, Take 1 tablet (1,000 mcg total) by mouth daily., Disp: 20 tablet, Rfl: 0   tamsulosin (FLOMAX) 0.4 MG CAPS capsule, Take 1 capsule (0.4 mg total) by mouth daily., Disp: 90 capsule, Rfl: 3 No current facility-administered medications for this visit.  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   left lower extremity-- treated with eliquis   Gait disturbance    due to MS   GERD (gastroesophageal reflux disease)    watch diet   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/p  THA   History of DVT of lower extremity 2007   History of encephalopathy 04/2017   acute metabolic encephalopathy secondary to MS,  resolved   History of MRSA infection 2004   right hip infection post THA   History of pulmonary embolus (PE) 2005   Hydronephrosis, left    w/ acute kidney injury 02/ 2019   Hypertension    Left-sided weakness    due to MS   Migraines    MS (multiple sclerosis) [facility]) neurologist-  dr Sherilyn Dacosta   dx 2000--   Muscle spasticity    Neurogenic bladder    due to MS   Pulmonary embolism, bilateral (HCC) 09/17/2017   treated w/ eliquis   Strains to urinate    Urgency of urination     PAST SURGICAL HISTORY: Past Surgical History:  Procedure Laterality Date   CYSTOSCOPY WITH RETROGRADE PYELOGRAM, URETEROSCOPY AND STENT PLACEMENT Bilateral 11/29/2017   Procedure: BILATERAL  RETROGRADE PYELOGRAM, LEFT DIAGNOSIC URETEROSCOPY AND STENT LEFT PLACEMENT;  Surgeon: Crist Fat, MD;  Location: [facility];  Service: Urology;  Laterality: Bilateral;   HIP ARTHROSCOPY Left 07-05-2013   dr pill @[facility]    iliopsosas release, synovectomy   REVISION TOTAL HIP ARTHROPLASTY Right 03-28-2003    dr Charlann Boxer @[facility]    TOTAL HIP ARTHROPLASTY Bilateral left 11-05-2002  dr Charlann Boxer @[facility] ;   right 06/ 2004  @[facility]     FAMILY HISTORY: Family History  Problem Relation Age of Onset   Healthy Mother     Healthy Father    Colon cancer Paternal Grandmother    Breast cancer Paternal Grandmother 43   Hypertension Other    Diabetes Other     PHYSICAL EXAM   Today's Vitals   04/10/23 1430 04/10/23 1435  BP: (!) 155/109 (!) 155/102  Pulse: (!) 123 (!) 123  Weight: 198 lb (89.8 kg)   Height: 5\' 6"  (1.676 m)    Body mass index is 31.96 kg/m.    General: The patient is well-developed and well-nourished and in no acute distress.   Neurologic Exam   Mental status: The patient is alert and oriented x 3 at the time of the examination. The patient has apparent normal recent and remote memory, with mildly reduced attention span and concentration ability.   Speech is normal.   Cranial nerves: Extraocular movements are full.  Facial strength and sensation is normal.  Trapezius strength is normal.. No obvious hearing deficits are noted.   Motor:  Muscle bulk is normal.  She has increased muscle tone in the left leg.  Strength is normal in the arms and 4+ to 5 in right leg and 2+/5 left hip flexion,  4-/5 in the distal left leg and 4+/5 left quads   Sensory: Touch and vibration sensation is normal in the arms and legs.   Coordination: She performs finger-nose-finger well.  She has reduced heel-to-shin, left worse than right.      Gait and station: She is able to take some steps but requires unilateral support.  There is a left foot drop.  Reflexes: Deep tendon reflexes are increased in the legs, left greater than right.  She has crossed abductor responses, worse on the left.  She has nonsustained clonus in the left ankle      DIAGNOSTIC DATA (LABS, IMAGING, TESTING) - I reviewed patient records, labs, notes, testing and imaging myself where available.  Lab Results  Component Value Date   WBC 5.3 01/07/2023   HGB 14.3 01/07/2023   HCT 42.0 01/07/2023   MCV 97.2 01/07/2023   PLT 258 01/07/2023      Component Value Date/Time   NA 142 01/07/2023 0029   NA 143 08/18/2022 1627    K 3.1 (L) 01/07/2023 0029   CL 102 01/07/2023 0029   CO2 28 01/07/2023 0021   GLUCOSE 85 01/07/2023 0029   BUN 11 01/07/2023 0029   BUN 15 08/18/2022 1627   CREATININE 1.30 (H) 01/07/2023 0029   CREATININE 1.51 (H) 03/31/2022 0949   CREATININE 0.85 07/03/2014 0959   CALCIUM 9.1 01/07/2023 0021   PROT 7.3 01/07/2023 0021   PROT 6.6 10/18/2022 1542   ALBUMIN 4.1 01/07/2023 0021   ALBUMIN 4.2 10/18/2022 1542   AST 53 (H) 01/07/2023 0021   AST 13 (L) 03/31/2022 0949   ALT 43 01/07/2023 0021   ALT 11 03/31/2022 0949   ALKPHOS 113 01/07/2023 0021   BILITOT 1.0 01/07/2023 0021   BILITOT 0.6 10/18/2022 1542   BILITOT 0.6 03/31/2022 0949   GFRNONAA >60 01/07/2023 0021   GFRNONAA 44 (L) 03/31/2022 0949   GFRNONAA 88 07/03/2014 0959   GFRAA 45 (L) 09/01/2019 0503   GFRAA >89 07/03/2014 0959   Lab Results  Component Value Date   CHOL 228 (H) 04/20/2017   HDL 77 04/20/2017   LDLCALC 142 (H) 04/20/2017   TRIG 46 04/20/2017   CHOLHDL 3.0 04/20/2017   Lab Results  Component Value Date   HGBA1C 5.3 04/20/2017   Lab Results  Component Value Date   VITAMINB12 203 04/20/2021   Lab Results  Component Value Date   TSH 2.140 04/19/2021      ASSESSMENT AND PLAN    1. Multiple sclerosis (HCC)   2. High risk medication use   3. Encounter for screening for HIV   4. Left-sided weakness   5. Left foot drop   6. Gait disturbance   7. Other fatigue   8. Stage 3 chronic kidney disease, unspecified whether stage 3a or 3b CKD (HCC)       1.   Change from teriflunomide to Kesimpta as some progression of her MS   check labs 2.   Continue amantadine 3.   Continue baclofen and tizanidine 4.   Continue seeing hematology for anemia 5.   Left AFO 6.   She will return to see me in 6 months or sooner if there are new or worsening neurologic symptoms.     This visit is part of a comprehensive longitudinal care medical relationship regarding the patients primary diagnosis of multiple  sclerosis and related concerns.   Jujuan Dugo A. Epimenio Foot, MD, PhD 04/10/2023, 6:25 PM Certified in Neurology, Clinical Neurophysiology, Sleep Medicine, Pain Medicine and Neuroimaging  [facility] [address] [place], [place] [address] [phone] ]

## 2023-04-11 ENCOUNTER — Telehealth: Payer: Self-pay | Admitting: *Deleted

## 2023-04-11 LAB — CBC WITH DIFFERENTIAL/PLATELET
Basophils Absolute: 0 10*3/uL (ref 0.0–0.2)
Basos: 0 %
EOS (ABSOLUTE): 0.1 10*3/uL (ref 0.0–0.4)
Eos: 1 %
Hematocrit: 44.8 % (ref 34.0–46.6)
Hemoglobin: 15.2 g/dL (ref 11.1–15.9)
Immature Grans (Abs): 0.1 10*3/uL (ref 0.0–0.1)
Immature Granulocytes: 1 %
Lymphocytes Absolute: 2.7 10*3/uL (ref 0.7–3.1)
Lymphs: 32 %
MCH: 32.3 pg (ref 26.6–33.0)
MCHC: 33.9 g/dL (ref 31.5–35.7)
MCV: 95 fL (ref 79–97)
Monocytes Absolute: 0.7 10*3/uL (ref 0.1–0.9)
Monocytes: 8 %
Neutrophils Absolute: 4.8 10*3/uL (ref 1.4–7.0)
Neutrophils: 58 %
Platelets: 277 10*3/uL (ref 150–450)
RBC: 4.71 x10E6/uL (ref 3.77–5.28)
RDW: 13.5 % (ref 11.7–15.4)
WBC: 8.3 10*3/uL (ref 3.4–10.8)

## 2023-04-11 LAB — COMPREHENSIVE METABOLIC PANEL
ALT: 14 [IU]/L (ref 0–32)
AST: 17 [IU]/L (ref 0–40)
Albumin: 4 g/dL (ref 3.9–4.9)
Alkaline Phosphatase: 123 [IU]/L — ABNORMAL HIGH (ref 44–121)
BUN/Creatinine Ratio: 13 (ref 9–23)
BUN: 14 mg/dL (ref 6–24)
Bilirubin Total: 0.6 mg/dL (ref 0.0–1.2)
CO2: 22 mmol/L (ref 20–29)
Calcium: 9.2 mg/dL (ref 8.7–10.2)
Chloride: 104 mmol/L (ref 96–106)
Creatinine, Ser: 1.1 mg/dL — ABNORMAL HIGH (ref 0.57–1.00)
Globulin, Total: 2.7 g/dL (ref 1.5–4.5)
Glucose: 90 mg/dL (ref 70–99)
Potassium: 4 mmol/L (ref 3.5–5.2)
Sodium: 144 mmol/L (ref 134–144)
Total Protein: 6.7 g/dL (ref 6.0–8.5)
eGFR: 64 mL/min/{1.73_m2} (ref >59)

## 2023-04-11 LAB — HEPATITIS B CORE ANTIBODY, TOTAL: Hep B Core Total Ab: NEGATIVE

## 2023-04-11 LAB — IGG, IGA, IGM
IgA/Immunoglobulin A, Serum: 61 mg/dL — ABNORMAL LOW (ref 87–352)
IgG (Immunoglobin G), Serum: 946 mg/dL (ref 586–1602)
IgM (Immunoglobulin M), Srm: 98 mg/dL (ref 26–217)

## 2023-04-11 LAB — HEPATITIS B SURFACE ANTIGEN: Hepatitis B Surface Ag: NEGATIVE

## 2023-04-11 LAB — HIV ANTIBODY (ROUTINE TESTING W REFLEX): HIV Screen 4th Generation wRfx: NONREACTIVE

## 2023-04-11 NOTE — Telephone Encounter (Signed)
Called pt. Relayed results per Dr. Bonnita Hollow note. She verbalized understanding. Aware we will send in start form to [facility] and to be on look out for phone call. Confirmed she has [employer] and Medicaid.

## 2023-04-11 NOTE — Telephone Encounter (Signed)
-----   Message from Dawn Peters sent at 04/11/2023 12:53 PM EST ----- Labs are fine to begin Kesimpta  (had TB test last year)

## 2023-04-18 NOTE — Telephone Encounter (Signed)
I called [employer]. Dawn Peters has been approved from 04/13/2023-03/06/2024. Reference  #[id number] for 20mg /.46ml/28day supply.

## 2023-04-20 ENCOUNTER — Encounter: Payer: Self-pay | Admitting: Oncology

## 2023-04-20 ENCOUNTER — Inpatient Hospital Stay (HOSPITAL_COMMUNITY): Payer: Medicare HMO

## 2023-04-20 ENCOUNTER — Observation Stay (HOSPITAL_BASED_OUTPATIENT_CLINIC_OR_DEPARTMENT_OTHER)
Admission: EM | Admit: 2023-04-20 | Discharge: 2023-04-21 | Disposition: A | Discharge status: Home / Self Care | Payer: Medicare HMO | Attending: Internal Medicine | Admitting: Internal Medicine

## 2023-04-20 ENCOUNTER — Encounter (HOSPITAL_BASED_OUTPATIENT_CLINIC_OR_DEPARTMENT_OTHER): Payer: Self-pay

## 2023-04-20 ENCOUNTER — Emergency Department (HOSPITAL_BASED_OUTPATIENT_CLINIC_OR_DEPARTMENT_OTHER): Payer: Medicare HMO

## 2023-04-20 ENCOUNTER — Other Ambulatory Visit: Payer: Self-pay

## 2023-04-20 DIAGNOSIS — F419 Anxiety disorder, unspecified: Secondary | ICD-10-CM | POA: Insufficient documentation

## 2023-04-20 DIAGNOSIS — Z96653 Presence of artificial knee joint, bilateral: Secondary | ICD-10-CM | POA: Diagnosis not present

## 2023-04-20 DIAGNOSIS — F32A Depression, unspecified: Secondary | ICD-10-CM | POA: Diagnosis not present

## 2023-04-20 DIAGNOSIS — I129 Hypertensive chronic kidney disease with stage 1 through stage 4 chronic kidney disease, or unspecified chronic kidney disease: Secondary | ICD-10-CM | POA: Diagnosis not present

## 2023-04-20 DIAGNOSIS — N183 Chronic kidney disease, stage 3 unspecified: Secondary | ICD-10-CM | POA: Insufficient documentation

## 2023-04-20 DIAGNOSIS — E785 Hyperlipidemia, unspecified: Secondary | ICD-10-CM | POA: Insufficient documentation

## 2023-04-20 DIAGNOSIS — I82431 Acute embolism and thrombosis of right popliteal vein: Secondary | ICD-10-CM

## 2023-04-20 DIAGNOSIS — G35 Multiple sclerosis: Secondary | ICD-10-CM | POA: Insufficient documentation

## 2023-04-20 DIAGNOSIS — Z7901 Long term (current) use of anticoagulants: Secondary | ICD-10-CM | POA: Diagnosis not present

## 2023-04-20 DIAGNOSIS — Z86718 Personal history of other venous thrombosis and embolism: Secondary | ICD-10-CM | POA: Diagnosis not present

## 2023-04-20 DIAGNOSIS — E669 Obesity, unspecified: Secondary | ICD-10-CM | POA: Insufficient documentation

## 2023-04-20 DIAGNOSIS — Z86711 Personal history of pulmonary embolism: Secondary | ICD-10-CM

## 2023-04-20 DIAGNOSIS — I2699 Other pulmonary embolism without acute cor pulmonale: Secondary | ICD-10-CM | POA: Diagnosis not present

## 2023-04-20 DIAGNOSIS — Z6833 Body mass index (BMI) 33.0-33.9, adult: Secondary | ICD-10-CM | POA: Insufficient documentation

## 2023-04-20 DIAGNOSIS — F909 Attention-deficit hyperactivity disorder, unspecified type: Secondary | ICD-10-CM | POA: Insufficient documentation

## 2023-04-20 DIAGNOSIS — Z79899 Other long term (current) drug therapy: Secondary | ICD-10-CM | POA: Diagnosis not present

## 2023-04-20 DIAGNOSIS — I2609 Other pulmonary embolism with acute cor pulmonale: Secondary | ICD-10-CM | POA: Diagnosis not present

## 2023-04-20 DIAGNOSIS — M549 Dorsalgia, unspecified: Secondary | ICD-10-CM | POA: Diagnosis present

## 2023-04-20 LAB — BASIC METABOLIC PANEL
Anion gap: 6 (ref 5–15)
BUN: 11 mg/dL (ref 6–20)
CO2: 30 mmol/L (ref 22–32)
Calcium: 9 mg/dL (ref 8.9–10.3)
Chloride: 103 mmol/L (ref 98–111)
Creatinine, Ser: 1.11 mg/dL — ABNORMAL HIGH (ref 0.44–1.00)
GFR, Estimated: >60 mL/min (ref >60)
Glucose, Bld: 96 mg/dL (ref 70–99)
Potassium: 4.6 mmol/L (ref 3.5–5.1)
Sodium: 139 mmol/L (ref 135–145)

## 2023-04-20 LAB — CBC WITH DIFFERENTIAL/PLATELET
Abs Immature Granulocytes: 0.03 10*3/uL (ref 0.00–0.07)
Basophils Absolute: 0 10*3/uL (ref 0.0–0.1)
Basophils Relative: 0 %
Eosinophils Absolute: 0 10*3/uL (ref 0.0–0.5)
Eosinophils Relative: 1 %
HCT: 39.2 % (ref 36.0–46.0)
Hemoglobin: 13.2 g/dL (ref 12.0–15.0)
Immature Granulocytes: 1 %
Lymphocytes Relative: 37 %
Lymphs Abs: 2.3 10*3/uL (ref 0.7–4.0)
MCH: 32.2 pg (ref 26.0–34.0)
MCHC: 33.7 g/dL (ref 30.0–36.0)
MCV: 95.6 fL (ref 80.0–100.0)
Monocytes Absolute: 0.6 10*3/uL (ref 0.1–1.0)
Monocytes Relative: 9 %
Neutro Abs: 3.4 10*3/uL (ref 1.7–7.7)
Neutrophils Relative %: 52 %
Platelets: 292 10*3/uL (ref 150–400)
RBC: 4.1 MIL/uL (ref 3.87–5.11)
RDW: 14.3 % (ref 11.5–15.5)
WBC: 6.3 10*3/uL (ref 4.0–10.5)
nRBC: 0 % (ref 0.0–0.2)

## 2023-04-20 LAB — TROPONIN I (HIGH SENSITIVITY)
Troponin I (High Sensitivity): <2 ng/L (ref <18)
Troponin I (High Sensitivity): 2 ng/L (ref <18)

## 2023-04-20 LAB — HEPARIN LEVEL (UNFRACTIONATED): Heparin Unfractionated: >1.1 [IU]/mL — ABNORMAL HIGH (ref 0.30–0.70)

## 2023-04-20 LAB — HCG, SERUM, QUALITATIVE: Preg, Serum: NEGATIVE

## 2023-04-20 LAB — APTT: aPTT: 124 s — ABNORMAL HIGH (ref 24–36)

## 2023-04-20 MED ORDER — SODIUM CHLORIDE 0.9 % IV BOLUS
1000.0000 mL | Freq: Once | INTRAVENOUS | Status: AC
  Administered 2023-04-20: 1000 mL via INTRAVENOUS

## 2023-04-20 MED ORDER — TAMSULOSIN HCL 0.4 MG PO CAPS
0.4000 mg | ORAL_CAPSULE | Freq: Every day | ORAL | Status: DC
  Administered 2023-04-20 – 2023-04-21 (×2): 0.4 mg via ORAL
  Filled 2023-04-20 (×2): qty 1

## 2023-04-20 MED ORDER — DALFAMPRIDINE ER 10 MG PO TB12: 10.0000 mg | ORAL_TABLET | Freq: Two times a day (BID) | ORAL | Status: DC

## 2023-04-20 MED ORDER — IOHEXOL 350 MG/ML SOLN
100.0000 mL | Freq: Once | INTRAVENOUS | Status: AC | PRN
  Administered 2023-04-20: 75 mL via INTRAVENOUS

## 2023-04-20 MED ORDER — TIZANIDINE HCL 2 MG PO TABS
4.0000 mg | ORAL_TABLET | Freq: Every day | ORAL | Status: DC
  Administered 2023-04-21: 4 mg via ORAL
  Filled 2023-04-20: qty 2

## 2023-04-20 MED ORDER — BISACODYL 5 MG PO TBEC: 5.0000 mg | DELAYED_RELEASE_TABLET | Freq: Every day | ORAL | Status: DC | PRN

## 2023-04-20 MED ORDER — MORPHINE SULFATE (PF) 4 MG/ML IV SOLN
4.0000 mg | Freq: Once | INTRAVENOUS | Status: AC
  Administered 2023-04-20: 4 mg via INTRAVENOUS
  Filled 2023-04-20: qty 1

## 2023-04-20 MED ORDER — ATORVASTATIN CALCIUM 10 MG PO TABS
20.0000 mg | ORAL_TABLET | Freq: Every day | ORAL | Status: DC
  Administered 2023-04-20 – 2023-04-21 (×2): 20 mg via ORAL
  Filled 2023-04-20 (×2): qty 2

## 2023-04-20 MED ORDER — BUSPIRONE HCL 5 MG PO TABS
10.0000 mg | ORAL_TABLET | Freq: Two times a day (BID) | ORAL | Status: DC
Start: 2023-04-20 — End: 2023-04-21
  Administered 2023-04-20 – 2023-04-21 (×2): 10 mg via ORAL
  Filled 2023-04-20 (×2): qty 2

## 2023-04-20 MED ORDER — AMITRIPTYLINE HCL 50 MG PO TABS
25.0000 mg | ORAL_TABLET | Freq: Every day | ORAL | Status: DC
  Administered 2023-04-20: 25 mg via ORAL
  Filled 2023-04-20: qty 1

## 2023-04-20 MED ORDER — AMANTADINE HCL 100 MG PO CAPS
100.0000 mg | ORAL_CAPSULE | Freq: Two times a day (BID) | ORAL | Status: DC
  Administered 2023-04-20 – 2023-04-21 (×2): 100 mg via ORAL
  Filled 2023-04-20 (×3): qty 1

## 2023-04-20 MED ORDER — TERIFLUNOMIDE 14 MG PO TABS: 1.0000 | ORAL_TABLET | Freq: Every day | ORAL | Status: DC

## 2023-04-20 MED ORDER — BUSPIRONE HCL 5 MG PO TABS: 10.0000 mg | ORAL_TABLET | Freq: Two times a day (BID) | ORAL | Status: DC

## 2023-04-20 MED ORDER — CITALOPRAM HYDROBROMIDE 20 MG PO TABS: 10.0000 mg | ORAL_TABLET | Freq: Every day | ORAL | Status: DC

## 2023-04-20 MED ORDER — METOPROLOL SUCCINATE ER 25 MG PO TB24
50.0000 mg | ORAL_TABLET | Freq: Every day | ORAL | Status: DC
  Administered 2023-04-20 – 2023-04-21 (×2): 50 mg via ORAL
  Filled 2023-04-20 (×2): qty 2

## 2023-04-20 MED ORDER — HEPARIN (PORCINE) 25000 UT/250ML-% IV SOLN
950.0000 [IU]/h | INTRAVENOUS | Status: DC
  Administered 2023-04-20: 1300 [IU]/h via INTRAVENOUS
  Administered 2023-04-21: 950 [IU]/h via INTRAVENOUS
  Filled 2023-04-20 (×2): qty 250

## 2023-04-20 MED ORDER — AMPHETAMINE-DEXTROAMPHET ER 10 MG PO CP24
20.0000 mg | ORAL_CAPSULE | ORAL | Status: DC
  Administered 2023-04-21: 20 mg via ORAL
  Filled 2023-04-20: qty 2

## 2023-04-20 MED ORDER — PANTOPRAZOLE SODIUM 40 MG PO TBEC
40.0000 mg | DELAYED_RELEASE_TABLET | Freq: Every day | ORAL | Status: DC
  Administered 2023-04-20 – 2023-04-21 (×2): 40 mg via ORAL
  Filled 2023-04-20 (×2): qty 1

## 2023-04-20 MED ORDER — CITALOPRAM HYDROBROMIDE 20 MG PO TABS
10.0000 mg | ORAL_TABLET | Freq: Every day | ORAL | Status: DC
  Administered 2023-04-20 – 2023-04-21 (×2): 10 mg via ORAL
  Filled 2023-04-20 (×2): qty 1

## 2023-04-20 MED ORDER — HYDRALAZINE HCL 20 MG/ML IJ SOLN: 10.0000 mg | Freq: Four times a day (QID) | INTRAMUSCULAR | Status: DC | PRN

## 2023-04-20 MED ORDER — KETOROLAC TROMETHAMINE 15 MG/ML IJ SOLN
15.0000 mg | Freq: Once | INTRAMUSCULAR | Status: AC
  Administered 2023-04-20: 15 mg via INTRAVENOUS
  Filled 2023-04-20: qty 1

## 2023-04-20 MED ORDER — ALBUTEROL SULFATE (2.5 MG/3ML) 0.083% IN NEBU: 2.5000 mg | INHALATION_SOLUTION | Freq: Four times a day (QID) | RESPIRATORY_TRACT | Status: DC | PRN

## 2023-04-20 MED ORDER — ONDANSETRON HCL 4 MG/2ML IJ SOLN
4.0000 mg | Freq: Once | INTRAMUSCULAR | Status: AC
  Administered 2023-04-20: 4 mg via INTRAVENOUS
  Filled 2023-04-20: qty 2

## 2023-04-20 MED ORDER — GABAPENTIN 400 MG PO CAPS
400.0000 mg | ORAL_CAPSULE | Freq: Three times a day (TID) | ORAL | Status: DC
  Administered 2023-04-20 – 2023-04-21 (×4): 400 mg via ORAL
  Filled 2023-04-20 (×4): qty 1

## 2023-04-20 MED ORDER — POLYETHYLENE GLYCOL 3350 17 G PO PACK: 17.0000 g | PACK | Freq: Every day | ORAL | Status: DC | PRN

## 2023-04-20 MED ORDER — HEPARIN BOLUS VIA INFUSION
6000.0000 [IU] | Freq: Once | INTRAVENOUS | Status: AC
  Administered 2023-04-20: 6000 [IU] via INTRAVENOUS

## 2023-04-20 MED ORDER — AMLODIPINE BESYLATE 5 MG PO TABS
5.0000 mg | ORAL_TABLET | Freq: Every day | ORAL | Status: DC
  Administered 2023-04-20 – 2023-04-21 (×2): 5 mg via ORAL
  Filled 2023-04-20 (×2): qty 1

## 2023-04-20 MED ORDER — ACETAMINOPHEN 500 MG PO TABS
1000.0000 mg | ORAL_TABLET | Freq: Three times a day (TID) | ORAL | Status: DC
  Administered 2023-04-20 – 2023-04-21 (×4): 1000 mg via ORAL
  Filled 2023-04-20 (×4): qty 2

## 2023-04-20 MED ORDER — LIDOCAINE 5 % EX PTCH
1.0000 | MEDICATED_PATCH | CUTANEOUS | Status: DC
  Administered 2023-04-20: 1 via TRANSDERMAL
  Filled 2023-04-20: qty 1

## 2023-04-20 MED ORDER — ACETAMINOPHEN 650 MG RE SUPP: 650.0000 mg | Freq: Four times a day (QID) | RECTAL | Status: DC | PRN

## 2023-04-20 MED ORDER — ACETAMINOPHEN 325 MG PO TABS: 650.0000 mg | ORAL_TABLET | Freq: Four times a day (QID) | ORAL | Status: DC | PRN

## 2023-04-20 NOTE — Progress Notes (Signed)
PHARMACY - ANTICOAGULATION CONSULT NOTE  Pharmacy Consult for heparin Indication: pulmonary embolus  Allergies  Allergen Reactions   Ace Inhibitors Swelling   Amoxicillin Itching    Did it involve swelling of the face/tongue/throat, SOB, or low BP? Yes Did it involve sudden or severe rash/hives, skin peeling, or any reaction on the inside of your mouth or nose? No Did you need to seek medical attention at a hospital or doctor's office? No When did it last happen?      unknown  If all above answers are "NO", may proceed with cephalosporin use.;   Lisinopril Swelling    Lower lip swelling    Patient Measurements: Height: 5\' 5"  (165.1 cm) Weight: 90.7 kg (200 lb) IBW/kg (Calculated) : 57 Heparin Dosing Weight: 77.1 kg   Vital Signs: Temp: 98.1 F (36.7 C) (02/13 1646) Temp Source: Oral (02/13 1646) BP: 147/85 (02/13 1646) Pulse Rate: 117 (02/13 1646)  Labs: Recent Labs    04/20/23 0545 04/20/23 0945 04/20/23 1558  HGB 13.2  --   --   HCT 39.2  --   --   PLT 292  --   --   APTT  --   --  124*  HEPARINUNFRC  --   --  >1.10*  CREATININE 1.11*  --   --   TROPONINIHS <2 2  --     Estimated Creatinine Clearance: 72 mL/min (A) (by C-G formula based on SCr of 1.11 mg/dL (H)).   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   History of DVT of lower extremity 2007   History of encephalopathy 04/2017   acute metabolic encephalopathy secondary to MS,  resolved   History of MRSA infection 2004   right hip infection post THA   History of  pulmonary embolus (PE) 2005   Hydronephrosis, left    w/ acute kidney injury 02/ 2019   Hypertension    Left-sided weakness    due to MS   Migraines    MS (multiple sclerosis) [facility]) neurologist-  dr Sherilyn Dacosta   dx 2000--   Muscle spasticity    Neurogenic bladder    due to MS   Pulmonary embolism, bilateral (HCC) 09/17/2017   treated w/ eliquis   Strains to urinate    Urgency of urination     Medications:  Took Eliquis 5 mg BID before admission but stopped taking prior to spinal injection procedure   Assessment: Patient with a history of MS, DVT, anemia, chronic renal insufficiency presented to ED with chest and back pain. Patient recently stopped her Eliquis for a medical procedure several days prior. CTA PE on 2/13 revealed "acute bilateral PE with CT evidence of right heart strain (RV/LV Ratio = 1.0) consistent with at least submassive (intermediate risk) PE".   Initial heparin level came back elevated >1.1 (likely secondary to DOAC, LD 2/12@0900 ), aPTT also elevated at 124, on heparin infusion at 1300 units/hr. No s/sx of bleeding or infusion issues.  Goal of Therapy:  Heparin level 0.3-0.7 units/ml aPTT 66-102 seconds Monitor platelets by anticoagulation protocol: Yes   Plan:  Reduce heparin infusion to 1150 units/hr Check levels in 6 hrs Monitor both aPTT/HL until correlate, CBC, and for s/sx of bleeding  Thank you for allowing pharmacy to participate in this patient's care,  Sherron Monday, PharmD, BCCCP Clinical Pharmacist  Phone: [phone] 04/20/2023 5:43 PM  Please check AMION for all [facility] phone numbers After 10:00 PM, call Main Pharmacy [phone]

## 2023-04-20 NOTE — Plan of Care (Signed)
Problem: Clinical Measurements: Goal: Ability to maintain clinical measurements within normal limits will improve Outcome: Progressing Goal: Will remain free from infection Outcome: Progressing   Problem: Activity: Goal: Risk for activity intolerance will decrease Outcome: Progressing

## 2023-04-20 NOTE — ED Notes (Signed)
Called [facility] for transport, pt bed assignment is ready

## 2023-04-20 NOTE — ED Triage Notes (Signed)
Right upper back pain x 3 days 9/10  no rash, no c/o dyspnea, no cough v/s stable.

## 2023-04-20 NOTE — H&P (Signed)
[facility] History and Physical  Dawn Peters AVW:[id number] DOB: [birth year] DOA: 04/20/2023 PCP: Asa Lente, MD  Presented from: Home Chief Complaint: Right upper back pain  History of Present Illness: Dawn Peters is a 43 y.o. female with PMH significant for DVT/PE 2019 on Eliquis, multiple sclerosis, HTN, anxiety/depression. 2/13, patient presented to the ED at [facility] with complaint of right upper back pain for 3 days.  In 2019, patient had DVT and PE she has been on Eliquis since then.  That was her first episode of blood clot and she is not aware why she was continued on Eliquis for more than 6 months.  She does not have any personal history of cancer. 2 weeks ago, she had some kind of 'needle treatment of the nerves' done at [facility].  Eliquis was held for 3 days prior to procedure and she resumed it immediately after that.  She reports strict compliance to Eliquis twice daily otherwise. She works as a Hospital doctor for [employer], has to be physical at her work and has not noticed any limitation or level of activities. Denies any recent weight loss, fatigue, bleeding.  In the ED, patient was afebrile, heart rate in 90s and low 100s, blood pressure in normal range. Labs with CBC unremarkable, BMP with BUN/creatinine 11/1.11, troponin not elevated CT angio of chest -Positive for acute bilateral PE with CT evidence of right heart strain (RV/LV Ratio = 1.0) consistent with at least submassive (intermediate risk) PE.  -Small wedge-shaped opacity in the peripheral right upper lobe could reflect early pulmonary infarct. -Trace right pleural effusion.  EDP started the patient on heparin drip Admitted to [facility] for inpatient care  I saw the patient after she arrived to [facility] this afternoon. At the time of my evaluation, was propped up in bed.  Not in distress.  Upper back pain improving after pain med was given earlier.  Denies any shortness of breath. History  reviewed and detailed as above.   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   left lower extremity-- treated with eliquis   Gait disturbance    due to MS   GERD (gastroesophageal reflux disease)    watch diet   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/p  THA   History of DVT of lower extremity 2007   History of encephalopathy 04/2017   acute metabolic encephalopathy secondary to MS,  resolved   History of MRSA infection 2004   right hip infection post THA   History of pulmonary embolus (PE) 2005   Hydronephrosis, left    w/ acute kidney injury 02/ 2019   Hypertension    Left-sided weakness    due to MS   Migraines    MS (multiple sclerosis) [facility]) neurologist-  dr Sherilyn Dacosta   dx 2000--   Muscle spasticity    Neurogenic bladder    due to MS   Pulmonary embolism, bilateral (HCC) 09/17/2017   treated w/ eliquis   Strains to urinate    Urgency of urination     Past surgical history: Past Surgical History:  Procedure Laterality Date   CYSTOSCOPY WITH RETROGRADE PYELOGRAM, URETEROSCOPY AND STENT PLACEMENT Bilateral 11/29/2017   Procedure: BILATERAL  RETROGRADE PYELOGRAM, LEFT DIAGNOSIC URETEROSCOPY AND STENT LEFT PLACEMENT;  Surgeon: Crist Fat, MD;  Location: [facility];  Service: Urology;  Laterality: Bilateral;   HIP ARTHROSCOPY Left 07-05-2013   dr pill @[facility]    iliopsosas release, synovectomy   REVISION TOTAL HIP ARTHROPLASTY Right 03-28-2003    dr Charlann Boxer @[facility]    TOTAL HIP ARTHROPLASTY Bilateral left 11-05-2002  dr Charlann Boxer @[facility] ;   right 06/ 2004  @[facility]     Social History:  reports that she  has never smoked. She has never used smokeless tobacco. She reports that she does not drink alcohol and does not use drugs.  Allergies:  Allergies  Allergen Reactions   Ace Inhibitors Swelling   Amoxicillin Itching    Did it involve swelling of the face/tongue/throat, SOB, or low BP? Yes Did it involve sudden or severe rash/hives, skin peeling, or any reaction on the inside of your mouth or nose? No Did you need to seek medical attention at a hospital or doctor's office? No When did it last happen?      unknown  If all above answers are "NO", may proceed with cephalosporin use.;   Lisinopril Swelling    Lower lip swelling   Ace inhibitors, Amoxicillin, and Lisinopril   Family history:  Family History  Problem Relation Age of Onset   Healthy Mother    Healthy Father    Colon cancer Paternal Grandmother    Breast cancer Paternal Grandmother 12   Hypertension Other    Diabetes Other      Physical Exam: Vitals:   04/20/23 0530 04/20/23 0610 04/20/23 0845 04/20/23 1316  BP: 131/88 127/86 128/87 (!) 134/96  Pulse: 95 99 (!) 101 100  Resp: 18 20 20 17   Temp:   98 F (36.7 C) 97.8 F (36.6 C)  TempSrc:   Oral Oral  SpO2: 97% 98% 99% 99%  Weight:      Height:       Wt Readings from Last 3 Encounters:  04/20/23 90.7 kg  04/10/23 89.8 kg  11/20/22 110.2 kg   Body mass index is 33.28 kg/m.  General exam: Pleasant, young African-American female Skin: No rashes, lesions or ulcers. HEENT: Atraumatic, normocephalic, no obvious bleeding Lungs: Clear to auscultation bilaterally, pain in the right upper back on deep breathing CVS: S1, S2, no murmur,   GI/Abd: Soft, nontender, nondistended, bowel sound present,   CNS: Alert, awake, oriented x 3 Psychiatry: Mood appropriate,  Extremities: No pedal edema, no calf tenderness,     ----------------------------------------------------------------------------------------------------------------------------------------- ----------------------------------------------------------------------------------------------------------------------------------------- -----------------------------------------------------------------------------------------------------------------------------------------  Assessment/Plan: Principal Problem:   Acute pulmonary embolism (HCC)  Acute pulm embolism H/o DVT/PE Pulm infarction Presented with right upper back pain for 3 days Imaging showed acute bilateral submassive PE with right heart strain. Started on heparin drip Patient reports strict compliance to Eliquis except for 3 days 2 weeks ago.  I do not think missing Eliquis for 3 days only we will precipitate clotting cascade. Need to rule out malignancy. Obtain ultrasound duplex of lower extremities I will order for CT abdomen pelvis with contrast for tomorrow.  Already received contrast today. If negative, consider switching to another DOAC prior to discharge. Pleuritic pain -Tylenol scheduled for pleuritic pain.  States she she would like to avoid ibuprofen because of h/o stomach ulcers.  Add lidocaine patch  Multiple sclerosis ADHD Anxiety/depression On multiple neuropsych meds  - teriflunomide, dalfampridine,  amantadine, amitriptyline, Adderall, baclofen, BuSpar, Celexa, Neurontin, Zanaflex. Follows up with neurologist Dr. Epimenio Foot  Hypertension Continue Toprol, amlodipine  HLD Continue statin  Mobility: Encourage ambulation  Goals of care:   Code Status: Full Code    DVT prophylaxis: Heparin drip    Antimicrobials: None Fluid: None Consultants: None Family Communication: None at bedside  Dispo: The patient is from: Home              Anticipated d/c is to: Pending clinical course,  Diet: Diet Order             Diet Heart Room service appropriate? Yes; Fluid  consistency: Thin  Diet effective now                    ------------------------------------------------------------------------------------- Severity of Illness: The appropriate patient status for this patient is INPATIENT. Inpatient status is judged to be reasonable and necessary in order to provide the required intensity of service to ensure the patient's safety. The patient's presenting symptoms, physical exam findings, and initial radiographic and laboratory data in the context of their chronic comorbidities is felt to place them at high risk for further clinical deterioration. Furthermore, it is not anticipated that the patient will be medically stable for discharge from the hospital within 2 midnights of admission.   * I certify that at the point of admission it is my clinical judgment that the patient will require inpatient hospital care spanning beyond 2 midnights from the point of admission due to high intensity of service, high risk for further deterioration and high frequency of surveillance required.* -------------------------------------------------------------------------------------  Home Meds: Prior to Admission medications   Medication Sig Start Date End Date Taking? Authorizing Provider  amantadine (SYMMETREL) 100 MG capsule Take 1 capsule (100 mg total) by mouth 2 (two) times daily. 08/18/22  Yes Sater, Pearletha Furl, MD  amitriptyline (ELAVIL) 25 MG tablet TAKE 1 TO 2 TABLETS (25 MG TO 50 MG) BY MOUTH AT BEDTIME 08/23/22  Yes Sater, Pearletha Furl, MD  amLODipine (NORVASC) 5 MG tablet Take 1 tablet (5 mg total) by mouth daily. Patient taking differently: Take 5 mg by mouth daily after breakfast. 05/21/17  Yes Arrien, York Ram, MD  amphetamine-dextroamphetamine (ADDERALL XR) 20 MG 24 hr capsule TAKE ONE CAPSULE DAILY AT 9AM (VIAL) 02/27/23  Yes Sater, Pearletha Furl, MD  atorvastatin (LIPITOR) 20 MG tablet Take 20 mg by mouth daily. 04/19/23  Yes [provider]  baclofen  (LIORESAL) 10 MG tablet TAKE ONE TABLET BY MOUTH FOUR TIMES DAILY @9AM -1PM-5PM-9PM 02/27/23  Yes Sater, Pearletha Furl, MD  busPIRone (BUSPAR) 15 MG tablet TAKE ONE TABLET BY MOUTH TWICE DAILY @ 9AM & 5PM 02/20/23  Yes Sater, Pearletha Furl, MD  citalopram (CELEXA) 20 MG tablet TAKE ONE TABLET BY MOUTH DAILY AT 9AM 02/27/23  Yes Sater, Pearletha Furl, MD  dalfampridine 10 MG TB12 TAKE ONE TABLET BY MOUTH TWICE DAILY @ 9AM & 9PM 02/20/23  Yes Sater, Pearletha Furl, MD  diphenhydrAMINE (BENADRYL) 25 mg capsule Take 25 mg by mouth every 6 (six) hours as needed for itching.   Yes [provider]  ELIQUIS 5 MG TABS tablet TAKE ONE TABLET BY MOUTH TWICE DAILY @ 9AM & 9PM 01/16/23  Yes Sater, Pearletha Furl, MD  gabapentin (NEURONTIN) 800 MG tablet TAKE ONE TABLET BY MOUTH FOUR TIMES DAILY 9AM-1PM-5PM-9PM 02/20/23  Yes Sater, Pearletha Furl, MD  metoprolol succinate (TOPROL-XL) 50 MG 24 hr tablet Take 50 mg by mouth daily. 09/27/20  Yes [provider]  Multiple Vitamin (MULTIVITAMIN WITH MINERALS) TABS tablet Take 1 tablet by mouth daily. 09/20/17  Yes Darlin Drop, DO  norethindrone (MICRONOR) 0.35 MG tablet Take 1 tablet (0.35 mg total) by mouth daily. 11/19/18  Yes Venora Maples, MD  omeprazole (PRILOSEC) 40 MG capsule Take 40 mg by mouth daily. 09/27/20  Yes [provider]  SUMAtriptan (IMITREX) 100 MG tablet ORIG} TAKE ONE TABLET BY MOUTH ONE TIME FOR UP TO ONE DOSE AS NEEDED FOR MIGRAINE. MAY REPEAT IN 2 HOURS IF HEADACHE PRESISTS OR RECURS 01/30/23  Yes Sater, Pearletha Furl, MD  tamsulosin (FLOMAX) 0.4 MG CAPS capsule Take 1 capsule (0.4 mg total) by mouth daily. 04/10/23  Yes Sater, Pearletha Furl, MD  Teriflunomide 14 MG TABS Take 1 tablet (14 mg total) by mouth daily. 11/02/22  Yes Sater, Pearletha Furl, MD  tiZANidine (ZANAFLEX) 4 MG tablet TAKE ONE TABLET BY MOUTH DAILY AT 9PM EVERY NIGHT AT BEDTIME 10/10/22  Yes Sater, Pearletha Furl, MD  methylPREDNISolone (MEDROL DOSEPAK) 4 MG TBPK tablet Take by mouth as directed.  Take 6 tablets on day one, then decrease by one tablet daily until gone/fim Patient not taking: Reported on 04/20/2023 09/20/22   Asa Lente, MD    Labs on Admission:   CBC: Recent Labs  Lab 04/20/23 0545  WBC 6.3  NEUTROABS 3.4  HGB 13.2  HCT 39.2  MCV 95.6  PLT 292    Basic Metabolic Panel: Recent Labs  Lab 04/20/23 0545  NA 139  K 4.6  CL 103  CO2 30  GLUCOSE 96  BUN 11  CREATININE 1.11*  CALCIUM 9.0    Liver Function Tests: No results for input(s): "AST", "ALT", "ALKPHOS", "BILITOT", "PROT", "ALBUMIN" in the last 168 hours. No results for input(s): "LIPASE", "AMYLASE" in the last 168 hours. No results for input(s): "AMMONIA" in the last 168 hours.  Cardiac Enzymes: No results for input(s): "CKTOTAL", "CKMB", "CKMBINDEX", "TROPONINI" in the last 168 hours.  BNP (last 3 results) No results for input(s): "BNP" in the last 8760 hours.  ProBNP (last 3 results) No results for input(s): "PROBNP" in the last 8760 hours.  CBG: No results for input(s): "GLUCAP" in the last 168 hours.  Lipase     Component Value Date/Time   LIPASE 29 07/19/2019 0303     Urinalysis    Component Value Date/Time   COLORURINE YELLOW 07/13/2022 0512   APPEARANCEUR HAZY (A) 07/13/2022 0512   APPEARANCEUR Clear 02/19/2019 1710   LABSPEC 1.025 07/13/2022 0512   PHURINE 5.0 07/13/2022 0512   GLUCOSEU NEGATIVE 07/13/2022 0512   HGBUR SMALL (A) 07/13/2022 0512   BILIRUBINUR NEGATIVE 07/13/2022 0512   BILIRUBINUR Negative 02/19/2019 1710   KETONESUR NEGATIVE 07/13/2022 0512   PROTEINUR NEGATIVE 07/13/2022 0512   UROBILINOGEN 0.2 12/15/2016 1136   NITRITE NEGATIVE 07/13/2022 0512   LEUKOCYTESUR MODERATE (A) 07/13/2022 0512     Drugs of Abuse     Component Value Date/Time   LABOPIA NONE DETECTED 04/20/2017 0105   COCAINSCRNUR NONE DETECTED 04/20/2017 0105   LABBENZ NONE DETECTED 04/20/2017 0105   AMPHETMU NONE DETECTED 04/20/2017 0105   THCU NONE DETECTED 04/20/2017  0105   LABBARB NONE DETECTED 04/20/2017 0105      Radiological Exams on Admission: VAS [place] LOWER EXTREMITY VENOUS (DVT) Result Date: 04/20/2023  Lower Venous DVT Study Patient Name:  Dawn Peters [facility]  Date of Exam:   04/20/2023 Medical Rec #: [id number]  Accession #:    [id number] Date of Birth: [birth year]               Patient Gender: F Patient Age:   40 years Exam Location:  [facility] Procedure:      VAS [place] LOWER EXTREMITY VENOUS (DVT) Referring Phys: Lorin Glass --------------------------------------------------------------------------------  Indications: Pulmonary embolism.  Risk Factors: Confirmed PE. Anticoagulation: Heparin. Comparison Study: No prior studies. Performing Technologist: Chanda Busing RVT  Examination Guidelines: A complete evaluation includes B-mode imaging, spectral Doppler, color Doppler, and power Doppler as needed of all accessible portions of each vessel. Bilateral testing is considered an integral part of a complete examination. Limited examinations for reoccurring indications may be performed as noted. The reflux portion of the exam is performed with the patient in reverse Trendelenburg.  +---------+---------------+---------+-----------+----------+--------------+ RIGHT    CompressibilityPhasicitySpontaneityPropertiesThrombus Aging +---------+---------------+---------+-----------+----------+--------------+ CFV      Full           Yes      Yes                                 +---------+---------------+---------+-----------+----------+--------------+ SFJ      Full                                                        +---------+---------------+---------+-----------+----------+--------------+ FV Prox  Full                                                        +---------+---------------+---------+-----------+----------+--------------+ FV Mid   Full                                                         +---------+---------------+---------+-----------+----------+--------------+ FV DistalFull                                                        +---------+---------------+---------+-----------+----------+--------------+ PFV      Full                                                        +---------+---------------+---------+-----------+----------+--------------+ POP      Partial        No       No                   Acute          +---------+---------------+---------+-----------+----------+--------------+ PTV      Full                                                        +---------+---------------+---------+-----------+----------+--------------+  PERO     Full                                                        +---------+---------------+---------+-----------+----------+--------------+   +---------+---------------+---------+-----------+----------+--------------+ LEFT     CompressibilityPhasicitySpontaneityPropertiesThrombus Aging +---------+---------------+---------+-----------+----------+--------------+ CFV      Full           Yes      Yes                                 +---------+---------------+---------+-----------+----------+--------------+ SFJ      Full                                                        +---------+---------------+---------+-----------+----------+--------------+ FV Prox  Full                                                        +---------+---------------+---------+-----------+----------+--------------+ FV Mid   Full                                                        +---------+---------------+---------+-----------+----------+--------------+ FV DistalFull                                                        +---------+---------------+---------+-----------+----------+--------------+ PFV      Full                                                         +---------+---------------+---------+-----------+----------+--------------+ POP      Full           Yes      Yes                                 +---------+---------------+---------+-----------+----------+--------------+ PTV      Full                                                        +---------+---------------+---------+-----------+----------+--------------+ PERO     Full                                                        +---------+---------------+---------+-----------+----------+--------------+  Summary: RIGHT: - Findings consistent with acute deep vein thrombosis involving the right popliteal vein.  - No cystic structure found in the popliteal fossa.  LEFT: - There is no evidence of deep vein thrombosis in the lower extremity.  - No cystic structure found in the popliteal fossa.  *See table(s) above for measurements and observations.    Preliminary    CT Angio Chest PE W and/or Wo Contrast Result Date: 04/20/2023 CLINICAL DATA:  Severe right upper back pain for the past 3 days. No chest complaints. EXAM: CT ANGIOGRAPHY CHEST WITH CONTRAST TECHNIQUE: Multidetector CT imaging of the chest was performed using the standard protocol during bolus administration of intravenous contrast. Multiplanar CT image reconstructions and MIPs were obtained to evaluate the vascular anatomy. RADIATION DOSE REDUCTION: This exam was performed according to the departmental dose-optimization program which includes automated exposure control, adjustment of the mA and/or kV according to patient size and/or use of iterative reconstruction technique. CONTRAST:  75mL OMNIPAQUE IOHEXOL 350 MG/ML SOLN COMPARISON:  Chest x-ray dated April 07, 2022. CT chest dated Jul 19, 2019. FINDINGS: Cardiovascular: Satisfactory opacification of the pulmonary arteries to the segmental level. Acute pulmonary emboli in the distal left and right pulmonary arteries, right upper bilateral lower lobe lobar and segmental  pulmonary arteries, and lingular segmental pulmonary artery. Normal heart size. RV/LV ratio is 1.0. No pericardial effusion. No thoracic aortic aneurysm or dissection. Mediastinum/Nodes: No enlarged mediastinal, hilar, or axillary lymph nodes. Thyroid gland, trachea, and esophagus demonstrate no significant findings. Lungs/Pleura: Small wedge-shaped opacity in the peripheral right upper lobe could reflect early pulmonary infarct. Trace right pleural effusion. No pneumothorax. Upper Abdomen: No acute abnormality. Chronic severe left hydronephrosis and renal parenchymal volume loss. Musculoskeletal: No chest wall abnormality. No acute or significant osseous findings. Review of the MIP images confirms the above findings. IMPRESSION: 1. Positive for acute bilateral PE with CT evidence of right heart strain (RV/LV Ratio = 1.0) consistent with at least submassive (intermediate risk) PE. The presence of right heart strain has been associated with an increased risk of morbidity and mortality. Please refer to the "Code PE Focused" order set in EPIC. 2. Small wedge-shaped opacity in the peripheral right upper lobe could reflect early pulmonary infarct. 3. Trace right pleural effusion. Critical Value/emergent results were called by telephone at the time of interpretation on 04/20/2023 at 8:48 am to provider [facility], who verbally acknowledged these results. Electronically Signed   By: Obie Dredge M.D.   On: 04/20/2023 08:53     Signed, Lorin Glass, MD [facility] 04/20/2023

## 2023-04-20 NOTE — Progress Notes (Signed)
MEWS Progress Note  Patient Details Name: Brighid Koch MRN: [id number] DOB: 1981/01/08 Today's Date: 04/20/2023   MEWS Flowsheet Documentation:  Assess: MEWS Score Temp: 98.1 F (36.7 C) BP: (!) 147/85 MAP (mmHg): 100 Pulse Rate: (!) 117 Resp: 17 Level of Consciousness: Alert SpO2: 94 % O2 Device: Room Air Assess: MEWS Score MEWS Temp: 0 MEWS Systolic: 0 MEWS Pulse: 2 MEWS RR: 0 MEWS LOC: 0 MEWS Score: 2 MEWS Score Color: Yellow Assess: SIRS CRITERIA SIRS Temperature : 0 SIRS Respirations : 0 SIRS Pulse: 1 SIRS WBC: 0 SIRS Score Sum : 1 Assess: if the MEWS score is Yellow or Red Were vital signs accurate and taken at a resting state?: Yes Does the patient meet 2 or more of the SIRS criteria?: No MEWS guidelines implemented : Yes, yellow Treat MEWS Interventions: Considered administering scheduled or prn medications/treatments as ordered Take Vital Signs Increase Vital Sign Frequency : Yellow: Q2hr x1, continue Q4hrs until patient remains green for 12hrs Escalate MEWS: Escalate: Yellow: Discuss with charge nurse and consider notifying provider and/or RRT        Ashok Pall 04/20/2023, 6:17 PM

## 2023-04-20 NOTE — ED Provider Notes (Signed)
EMERGENCY DEPARTMENT AT [facility] Provider Note   CSN: [id number] Arrival date & time: 04/20/23  0441     History  Chief Complaint  Patient presents with   Back Pain    Right upper back pain x 3 days 9/10  no rash, no c/o dyspnea, no cough     Dawn Peters is a 43 y.o. female.  Patient is a 43 year old female with past medical history of multiple sclerosis, prior DVT, anemia, chronic renal insufficiency.  Patient presenting today for evaluation of chest and back pain.  She reports pain to the right lateral aspect of her chest that is worse when she moves or breathes.  She describes it as a "warm feeling".  She denies any shortness of breath.  No fevers or chills.  No leg swelling or pain.  She denies any injury or trauma.  The history is provided by the patient.       Home Medications Prior to Admission medications   Medication Sig Start Date End Date Taking? Authorizing Provider  acetaminophen (TYLENOL) 500 MG tablet Take 1,000 mg by mouth every 6 (six) hours as needed for moderate pain or headache.    [provider]  amantadine (SYMMETREL) 100 MG capsule Take 1 capsule (100 mg total) by mouth 2 (two) times daily. 08/18/22   Sater, Pearletha Furl, MD  amitriptyline (ELAVIL) 25 MG tablet TAKE 1 TO 2 TABLETS (25 MG TO 50 MG) BY MOUTH AT BEDTIME 08/23/22   Sater, Pearletha Furl, MD  amLODipine (NORVASC) 5 MG tablet Take 1 tablet (5 mg total) by mouth daily. Patient taking differently: Take 5 mg by mouth daily after breakfast. 05/21/17   Arrien, York Ram, MD  amphetamine-dextroamphetamine (ADDERALL XR) 20 MG 24 hr capsule TAKE ONE CAPSULE DAILY AT 9AM (VIAL) 02/27/23   Sater, Pearletha Furl, MD  atorvastatin (LIPITOR) 10 MG tablet Take 10 mg by mouth daily. 12/24/20   [provider]  baclofen (LIORESAL) 10 MG tablet TAKE ONE TABLET BY MOUTH FOUR TIMES DAILY @9AM -1PM-5PM-9PM 02/27/23   Sater, Pearletha Furl, MD  busPIRone (BUSPAR) 15 MG tablet TAKE  ONE TABLET BY MOUTH TWICE DAILY @ 9AM & 5PM 02/20/23   Sater, Pearletha Furl, MD  citalopram (CELEXA) 20 MG tablet TAKE ONE TABLET BY MOUTH DAILY AT 9AM 02/27/23   Sater, Pearletha Furl, MD  dalfampridine 10 MG TB12 TAKE ONE TABLET BY MOUTH TWICE DAILY @ 9AM & 9PM 02/20/23   Sater, Pearletha Furl, MD  diphenhydrAMINE (BENADRYL) 25 mg capsule Take 25 mg by mouth every 6 (six) hours as needed for itching.    [provider]  doxycycline (VIBRAMYCIN) 100 MG capsule Take 1 capsule (100 mg total) by mouth 2 (two) times daily. 07/13/22   Jeannie Fend, PA-C  ELIQUIS 5 MG TABS tablet TAKE ONE TABLET BY MOUTH TWICE DAILY @ 9AM & 9PM 01/16/23   Sater, Pearletha Furl, MD  folic acid (FOLVITE) 1 MG tablet Take 1 tablet (1 mg total) by mouth daily. 04/19/21   Regalado, Belkys A, MD  gabapentin (NEURONTIN) 800 MG tablet TAKE ONE TABLET BY MOUTH FOUR TIMES DAILY 9AM-1PM-5PM-9PM 02/20/23   Sater, Pearletha Furl, MD  JARDIANCE 10 MG TABS tablet Take 10 mg by mouth daily. 03/14/21   [provider]  LINZESS 72 MCG capsule Take 72 mcg by mouth daily as needed (constipation). 02/18/21   [provider]  methylPREDNISolone (MEDROL DOSEPAK) 4 MG TBPK tablet Take by mouth as directed. Take 6 tablets on  day one, then decrease by one tablet daily until gone/fim 09/20/22   Sater, Pearletha Furl, MD  metoprolol succinate (TOPROL-XL) 50 MG 24 hr tablet Take 50 mg by mouth daily. 09/27/20   [provider]  Multiple Vitamin (MULTIVITAMIN WITH MINERALS) TABS tablet Take 1 tablet by mouth daily. 09/20/17   Darlin Drop, DO  norethindrone (MICRONOR) 0.35 MG tablet Take 1 tablet (0.35 mg total) by mouth daily. 11/19/18   Venora Maples, MD  omeprazole (PRILOSEC) 40 MG capsule Take 40 mg by mouth daily. 09/27/20   [provider]  potassium chloride (KLOR-CON) 10 MEQ tablet Take 2 tablets (20 mEq total) by mouth daily. 01/07/23   Cardama, Amadeo Garnet, MD  predniSONE (DELTASONE) 10 MG tablet Take 60mg  on day 1. Reduce  by 10mg  each subsequent day. (60, 50, 40, 30, 20, 10, stop) 11/21/22   Penumalli, Glenford Bayley, MD  predniSONE (DELTASONE) 50 MG tablet 12 pills (600 mg) po daily x 3 days for MS exacerbation 02/16/23   Sater, Pearletha Furl, MD  SUMAtriptan (IMITREX) 100 MG tablet ORIG} TAKE ONE TABLET BY MOUTH ONE TIME FOR UP TO ONE DOSE AS NEEDED FOR MIGRAINE. MAY REPEAT IN 2 HOURS IF HEADACHE PRESISTS OR RECURS 01/30/23   Sater, Pearletha Furl, MD  tamsulosin (FLOMAX) 0.4 MG CAPS capsule Take 1 capsule (0.4 mg total) by mouth daily. 04/10/23   Sater, Pearletha Furl, MD  Teriflunomide 14 MG TABS Take 1 tablet (14 mg total) by mouth daily. 11/02/22   Sater, Pearletha Furl, MD  tiZANidine (ZANAFLEX) 4 MG tablet TAKE ONE TABLET BY MOUTH DAILY AT 9PM EVERY NIGHT AT BEDTIME 10/10/22   Sater, Pearletha Furl, MD  vitamin B-12 (CYANOCOBALAMIN) 1000 MCG tablet Take 1 tablet (1,000 mcg total) by mouth daily. 04/20/21   Regalado, Jon Billings A, MD      Allergies    Ace inhibitors, Amoxicillin, and Lisinopril    Review of Systems   Review of Systems  All other systems reviewed and are negative.   Physical Exam Updated Vital Signs BP (!) 124/93 (BP Location: Right Arm)   Pulse (!) 104   Temp 98 F (36.7 C) (Oral)   Ht 5\' 5"  (1.651 m)   Wt 90.7 kg   LMP 04/05/2023   SpO2 99%   BMI 33.28 kg/m  Physical Exam Vitals and nursing note reviewed.  Constitutional:      General: She is not in acute distress.    Appearance: She is well-developed. She is not diaphoretic.  HENT:     Head: Normocephalic and atraumatic.  Cardiovascular:     Rate and Rhythm: Normal rate and regular rhythm.     Heart sounds: No murmur heard.    No friction rub. No gallop.  Pulmonary:     Effort: Pulmonary effort is normal. No respiratory distress.     Breath sounds: Normal breath sounds. No wheezing.     Comments: There is tenderness to palpation in the right lateral chest.  There is no crepitus or bony abnormality. Abdominal:     General: Bowel sounds are normal. There  is no distension.     Palpations: Abdomen is soft.     Tenderness: There is no abdominal tenderness.  Musculoskeletal:        General: Normal range of motion.     Cervical back: Normal range of motion and neck supple.  Skin:    General: Skin is warm and dry.  Neurological:     General: No focal deficit present.  Mental Status: She is alert and oriented to person, place, and time.     ED Results / Procedures / Treatments   Labs (all labs ordered are listed, but only abnormal results are displayed) Labs Reviewed  BASIC METABOLIC PANEL    EKG None  Radiology No results found.  Procedures Procedures    Medications Ordered in ED Medications  sodium chloride 0.9 % bolus 1,000 mL (has no administration in time range)  morphine (PF) 4 MG/ML injection 4 mg (has no administration in time range)  ondansetron (ZOFRAN) injection 4 mg (has no administration in time range)    ED Course/ Medical Decision Making/ A&P  Patient is a 43 year old female presenting with complaints of right sided chest discomfort that is pleuritic in nature.  Patient arrives here with stable vital signs and is afebrile.  There is no hypoxia.  Physical examination is basically unremarkable except for reproducible tenderness to the right lateral chest.  Laboratory studies obtained including CBC and basic metabolic panel.  Both of these are basically unremarkable.  Pregnancy test is negative.  Patient to undergo CTA of the chest to rule out pulmonary embolism.  She does have history of DVT and is on Eliquis, but did stop this for several days for a medical procedure.    Final Clinical Impression(s) / ED Diagnoses Final diagnoses:  None    Rx / DC Orders ED Discharge Orders     None         Geoffery Lyons, MD 04/20/23 [phone]

## 2023-04-20 NOTE — Progress Notes (Signed)
Bilateral lower extremity venous duplex has been completed. Preliminary results can be found in CV Proc through chart review.  Results were given to Dr. Pola Corn.  04/20/23 3:44 PM Olen Cordial RVT

## 2023-04-20 NOTE — ED Provider Notes (Addendum)
Received signout from overnight team; pending CTA.  History of prior DVTs on Eliquis, but stopped taking them prior to spinal injection procedure for her chronic back pain.  Said symptoms for the past 3 days.  Mildly elevated heart rate, but hemodynamically stable.  Not requiring oxygen.  Complaining of pain to her right upper back around to mid axillary line.  Worse with movement, but also pleuritic.  Labs so far reassuring no leukocytosis to suspect chest infection.  No anemia.  No significant metabolic derangements.  Minor elevation in her creatinine.  CTA was positive for PE with some strain possible infarct.  Started on heparin.  Plan to admit.   Coral Spikes, DO 04/20/23 4782   Clinical Course as of 04/20/23 1536  Thu Apr 20, 2023  0856 CT Angio Chest PE W and/or Wo Contrast IMPRESSION: 1. Positive for acute bilateral PE with CT evidence of right heart strain (RV/LV Ratio = 1.0) consistent with at least submassive (intermediate risk) PE. The presence of right heart strain has been associated with an increased risk of morbidity and mortality. Please refer to the "Code PE Focused" order set in EPIC. 2. Small wedge-shaped opacity in the peripheral right upper lobe could reflect early pulmonary infarct. 3. Trace right pleural effusion.  Critical Value/emergent results were called by telephone at the time of interpretation on 04/20/2023 at 8:48 am to provider [facility], who verbally acknowledged these results.   Electronically Signed   By: Obie Dredge M.D.   On: 04/20/2023 08:53   [TY]  0856 + for PE with evidence of strain and possible lung infarct. Starting heparin. Plan for admission.  [TY]    Clinical Course User Index [TY] Coral Spikes, DO      Coral Spikes, [place] 04/20/23 1536

## 2023-04-20 NOTE — Progress Notes (Signed)
PHARMACY - ANTICOAGULATION CONSULT NOTE  Pharmacy Consult for heparin Indication: pulmonary embolus  Allergies  Allergen Reactions   Ace Inhibitors Swelling   Amoxicillin Itching    Did it involve swelling of the face/tongue/throat, SOB, or low BP? Yes Did it involve sudden or severe rash/hives, skin peeling, or any reaction on the inside of your mouth or nose? No Did you need to seek medical attention at a hospital or doctor's office? No When did it last happen?      unknown  If all above answers are "NO", may proceed with cephalosporin use.;   Lisinopril Swelling    Lower lip swelling    Patient Measurements: Height: 5\' 5"  (165.1 cm) Weight: 90.7 kg (200 lb) IBW/kg (Calculated) : 57 Heparin Dosing Weight: 77.1 kg   Vital Signs: Temp: 98 F (36.7 C) (02/13 0845) Temp Source: Oral (02/13 0845) BP: 128/87 (02/13 0845) Pulse Rate: 101 (02/13 0845)  Labs: Recent Labs    04/20/23 0545  HGB 13.2  HCT 39.2  PLT 292  CREATININE 1.11*  TROPONINIHS <2    Estimated Creatinine Clearance: 72 mL/min (A) (by C-G formula based on SCr of 1.11 mg/dL (H)).   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   History of DVT of lower extremity 2007   History of encephalopathy 04/2017   acute metabolic encephalopathy secondary to MS,  resolved   History of MRSA infection 2004   right hip infection post THA   History of pulmonary embolus (PE) 2005   Hydronephrosis, left    w/ acute kidney injury 02/ 2019   Hypertension    Left-sided weakness    due  to MS   Migraines    MS (multiple sclerosis) [facility]) neurologist-  dr Sherilyn Dacosta   dx 2000--   Muscle spasticity    Neurogenic bladder    due to MS   Pulmonary embolism, bilateral (HCC) 09/17/2017   treated w/ eliquis   Strains to urinate    Urgency of urination     Medications:  Took Eliquis 5 mg BID before admission but stopped taking prior to spinal injection procedure   Assessment: Patient with a history of MS, DVT, anemia, chronic renal insufficiency presented to ED with chest and back pain. Patient recently stopped her Eliquis for a medical procedure several days prior. CTA PE on 2/13 revealed "acute bilateral PE with CT evidence of right heart strain (RV/LV Ratio = 1.0) consistent with at least submassive (intermediate risk) PE".   Troponin I < 2. Hb 13.2, plt 292.   Goal of Therapy:  Heparin level 0.3-0.7 units/ml aPTT 66-102 seconds Monitor platelets by anticoagulation protocol: Yes   Plan:  Give 6000 units bolus x 1 Start heparin infusion at 1300 units/hr Check anti-Xa level in 6 hours and daily while on heparin Continue to monitor H&H and platelets Will also check aPTT given recent Eliquis exposure until this correlates with heparin levels   Cedric Fishman 04/20/2023,9:23 AM

## 2023-04-21 ENCOUNTER — Encounter (HOSPITAL_BASED_OUTPATIENT_CLINIC_OR_DEPARTMENT_OTHER): Payer: Self-pay

## 2023-04-21 ENCOUNTER — Other Ambulatory Visit (HOSPITAL_COMMUNITY): Payer: Self-pay

## 2023-04-21 ENCOUNTER — Encounter: Payer: Self-pay | Admitting: Oncology

## 2023-04-21 ENCOUNTER — Inpatient Hospital Stay (HOSPITAL_COMMUNITY): Payer: Medicare HMO

## 2023-04-21 ENCOUNTER — Emergency Department (HOSPITAL_BASED_OUTPATIENT_CLINIC_OR_DEPARTMENT_OTHER)
Admission: EM | Admit: 2023-04-21 | Discharge: 2023-04-21 | Disposition: A | Discharge status: Home / Self Care | Payer: Medicare HMO | Source: Home / Self Care | Attending: Emergency Medicine | Admitting: Emergency Medicine

## 2023-04-21 DIAGNOSIS — I2602 Saddle embolus of pulmonary artery with acute cor pulmonale: Secondary | ICD-10-CM

## 2023-04-21 DIAGNOSIS — M549 Dorsalgia, unspecified: Secondary | ICD-10-CM | POA: Insufficient documentation

## 2023-04-21 DIAGNOSIS — I2699 Other pulmonary embolism without acute cor pulmonale: Principal | ICD-10-CM

## 2023-04-21 DIAGNOSIS — R109 Unspecified abdominal pain: Secondary | ICD-10-CM | POA: Insufficient documentation

## 2023-04-21 DIAGNOSIS — Z7901 Long term (current) use of anticoagulants: Secondary | ICD-10-CM | POA: Insufficient documentation

## 2023-04-21 DIAGNOSIS — R079 Chest pain, unspecified: Secondary | ICD-10-CM | POA: Insufficient documentation

## 2023-04-21 LAB — CBC WITH DIFFERENTIAL/PLATELET
Abs Immature Granulocytes: 0.02 K/uL (ref 0.00–0.07)
Basophils Absolute: 0 K/uL (ref 0.0–0.1)
Basophils Relative: 1 %
Eosinophils Absolute: 0.1 K/uL (ref 0.0–0.5)
Eosinophils Relative: 1 %
HCT: 42.1 % (ref 36.0–46.0)
Hemoglobin: 14.2 g/dL (ref 12.0–15.0)
Immature Granulocytes: 0 %
Lymphocytes Relative: 44 %
Lymphs Abs: 2.4 K/uL (ref 0.7–4.0)
MCH: 31.9 pg (ref 26.0–34.0)
MCHC: 33.7 g/dL (ref 30.0–36.0)
MCV: 94.6 fL (ref 80.0–100.0)
Monocytes Absolute: 0.5 K/uL (ref 0.1–1.0)
Monocytes Relative: 9 %
Neutro Abs: 2.5 K/uL (ref 1.7–7.7)
Neutrophils Relative %: 45 %
Platelets: 304 K/uL (ref 150–400)
RBC: 4.45 MIL/uL (ref 3.87–5.11)
RDW: 14 % (ref 11.5–15.5)
WBC: 5.5 K/uL (ref 4.0–10.5)
nRBC: 0 % (ref 0.0–0.2)

## 2023-04-21 LAB — COMPREHENSIVE METABOLIC PANEL WITH GFR
ALT: 11 U/L (ref 0–44)
AST: 13 U/L — ABNORMAL LOW (ref 15–41)
Albumin: 4.2 g/dL (ref 3.5–5.0)
Alkaline Phosphatase: 86 U/L (ref 38–126)
Anion gap: 9 (ref 5–15)
BUN: 13 mg/dL (ref 6–20)
CO2: 29 mmol/L (ref 22–32)
Calcium: 9.2 mg/dL (ref 8.9–10.3)
Chloride: 103 mmol/L (ref 98–111)
Creatinine, Ser: 1.5 mg/dL — ABNORMAL HIGH (ref 0.44–1.00)
GFR, Estimated: 44 mL/min — ABNORMAL LOW
Glucose, Bld: 85 mg/dL (ref 70–99)
Potassium: 3.3 mmol/L — ABNORMAL LOW (ref 3.5–5.1)
Sodium: 141 mmol/L (ref 135–145)
Total Bilirubin: 0.6 mg/dL (ref 0.0–1.2)
Total Protein: 7.6 g/dL (ref 6.5–8.1)

## 2023-04-21 LAB — BASIC METABOLIC PANEL
Anion gap: 6 (ref 5–15)
BUN: 12 mg/dL (ref 6–20)
CO2: 25 mmol/L (ref 22–32)
Calcium: 8.2 mg/dL — ABNORMAL LOW (ref 8.9–10.3)
Chloride: 108 mmol/L (ref 98–111)
Creatinine, Ser: 1.36 mg/dL — ABNORMAL HIGH (ref 0.44–1.00)
GFR, Estimated: 49 mL/min — ABNORMAL LOW (ref >60)
Glucose, Bld: 102 mg/dL — ABNORMAL HIGH (ref 70–99)
Potassium: 3.8 mmol/L (ref 3.5–5.1)
Sodium: 139 mmol/L (ref 135–145)

## 2023-04-21 LAB — CBC
HCT: 36 % (ref 36.0–46.0)
Hemoglobin: 12.2 g/dL (ref 12.0–15.0)
MCH: 31.9 pg (ref 26.0–34.0)
MCHC: 33.9 g/dL (ref 30.0–36.0)
MCV: 94 fL (ref 80.0–100.0)
Platelets: 292 10*3/uL (ref 150–400)
RBC: 3.83 MIL/uL — ABNORMAL LOW (ref 3.87–5.11)
RDW: 14 % (ref 11.5–15.5)
WBC: 5.5 10*3/uL (ref 4.0–10.5)
nRBC: 0 % (ref 0.0–0.2)

## 2023-04-21 LAB — ECHOCARDIOGRAM COMPLETE
Area-P 1/2: 2.93 cm2
Height: 65 in
S' Lateral: 2.9 cm
Weight: 3200 [oz_av]

## 2023-04-21 LAB — APTT: aPTT: 122 s — ABNORMAL HIGH (ref 24–36)

## 2023-04-21 MED ORDER — RIVAROXABAN (XARELTO) VTE STARTER PACK (15 & 20 MG)
ORAL_TABLET | ORAL | 2 refills | Status: DC
  Filled 2023-04-21: qty 51, 30d supply, fill #0
  Filled 2023-06-21: qty 51, 30d supply, fill #1

## 2023-04-21 MED ORDER — RIVAROXABAN 15 MG PO TABS
15.0000 mg | ORAL_TABLET | Freq: Two times a day (BID) | ORAL | Status: DC
  Administered 2023-04-21: 15 mg via ORAL
  Filled 2023-04-21 (×2): qty 1

## 2023-04-21 MED ORDER — RIVAROXABAN 20 MG PO TABS: 20.0000 mg | ORAL_TABLET | Freq: Every day | ORAL | Status: DC

## 2023-04-21 NOTE — Progress Notes (Signed)
PHARMACY - ANTICOAGULATION CONSULT NOTE  Pharmacy Consult for heparin Indication: pulmonary embolus  Allergies  Allergen Reactions   Ace Inhibitors Swelling   Amoxicillin Itching    Did it involve swelling of the face/tongue/throat, SOB, or low BP? Yes Did it involve sudden or severe rash/hives, skin peeling, or any reaction on the inside of your mouth or nose? No Did you need to seek medical attention at a hospital or doctor's office? No When did it last happen?      unknown  If all above answers are "NO", may proceed with cephalosporin use.;   Lisinopril Swelling    Lower lip swelling    Patient Measurements: Height: 5\' 5"  (165.1 cm) Weight: 90.7 kg (200 lb) IBW/kg (Calculated) : 57 Heparin Dosing Weight: 77.1 kg   Vital Signs: Temp: 98 F (36.7 C) (02/13 2117) Temp Source: Oral (02/13 2117) BP: 124/82 (02/13 2117) Pulse Rate: 82 (02/13 2117)  Labs: Recent Labs    04/20/23 0545 04/20/23 0945 04/20/23 1558 04/20/23 2347  HGB 13.2  --   --  12.2  HCT 39.2  --   --  36.0  PLT 292  --   --  292  APTT  --   --  124* 122*  HEPARINUNFRC  --   --  >1.10*  --   CREATININE 1.11*  --   --   --   TROPONINIHS <2 2  --   --     Estimated Creatinine Clearance: 72 mL/min (A) (by C-G formula based on SCr of 1.11 mg/dL (H)).   Medications:  Took Eliquis 5 mg BID before admission but stopped taking prior to spinal injection procedure   Assessment: Patient with a history of MS, DVT, anemia, chronic renal insufficiency presented to ED with chest and back pain. Patient recently stopped her Eliquis for a medical procedure several days prior. CTA PE on 2/13 revealed "acute bilateral PE with CT evidence of right heart strain (RV/LV Ratio = 1.0) consistent with at least submassive (intermediate risk) PE".   Initial heparin level came back elevated >1.1 (likely secondary to DOAC, LD 2/12@0900 ), aPTT also elevated at 122 after dose decrease to 1150 units/hr. No s/sx of bleeding or  infusion issues per RN.   Goal of Therapy:  Heparin level 0.3-0.7 units/ml aPTT 66-102 seconds Monitor platelets by anticoagulation protocol: Yes   Plan:  Reduce heparin infusion to 950 units/hr Check levels in 6 hrs Monitor both aPTT/heparin level until correlate, CBC, and for s/sx of bleeding  Thank you for allowing pharmacy to participate in this patient's care,  Arabella Merles, PharmD. Clinical Pharmacist 04/21/2023 12:23 AM

## 2023-04-21 NOTE — Progress Notes (Signed)
Echocardiogram 2D Echocardiogram has been performed.  Warren Lacy Anays Detore RDCS 04/21/2023, 11:16 AM

## 2023-04-21 NOTE — Progress Notes (Signed)
PHARMACY - ANTICOAGULATION CONSULT NOTE  Pharmacy Consult for heparin > xarelto Indication: pulmonary embolus  Allergies  Allergen Reactions   Ace Inhibitors Swelling   Amoxicillin Itching    Did it involve swelling of the face/tongue/throat, SOB, or low BP? Yes Did it involve sudden or severe rash/hives, skin peeling, or any reaction on the inside of your mouth or nose? No Did you need to seek medical attention at a hospital or doctor's office? No When did it last happen?      unknown  If all above answers are "NO", may proceed with cephalosporin use.;   Lisinopril Swelling    Lower lip swelling    Patient Measurements: Height: 5\' 5"  (165.1 cm) Weight: 90.7 kg (200 lb) IBW/kg (Calculated) : 57  Vital Signs: Temp: 98.1 F (36.7 C) (02/14 1207) Temp Source: Oral (02/14 1207) BP: 129/93 (02/14 1207) Pulse Rate: 88 (02/14 1207)  Labs: Recent Labs    04/20/23 0545 04/20/23 0945 04/20/23 1558 04/20/23 2347  HGB 13.2  --   --  12.2  HCT 39.2  --   --  36.0  PLT 292  --   --  292  APTT  --   --  124* 122*  HEPARINUNFRC  --   --  >1.10*  --   CREATININE 1.11*  --   --  1.36*  TROPONINIHS <2 2  --   --     Estimated Creatinine Clearance: 58.8 mL/min (A) (by C-G formula based on SCr of 1.36 mg/dL (H)).  Assessment: Patient with a history of MS, DVT, anemia, chronic renal insufficiency presented to ED with chest and back pain. Patient recently stopped her Eliquis for a medical procedure several days prior. CTA PE on 2/13 revealed "acute bilateral PE with CT evidence of right heart strain (RV/LV Ratio = 1.0) consistent with at least submassive (intermediate risk) PE".   Goal of Therapy:  Monitor platelets by anticoagulation protocol: Yes   Plan:  Stop heparin drip and give first dose xarelto at that time Xarelto 15mg  BID x21 days then 20mg  at bedtime thereafter, take with food -f/u CBC   Calton Dach, PharmD, BCCCP Clinical Pharmacist 04/21/2023 3:05 PM

## 2023-04-21 NOTE — Care Management Obs Status (Signed)
MEDICARE OBSERVATION STATUS NOTIFICATION   Patient Details  Name: Dawn Peters MRN: [id number] Date of Birth: 1980-09-11   Medicare Observation Status Notification Given:  Yes    Kingsley Plan, RN 04/21/2023, 4:04 PM

## 2023-04-21 NOTE — Progress Notes (Signed)
Discharge order received.  AVS reviewed with pt. Pt verbalized understanding Spouse/Partner went down to pick up medications and bring wheelchair up, will also transport pt home. Personal belongings with pt at time of discharge. Saline locks removed.

## 2023-04-21 NOTE — ED Notes (Signed)
Patient ambulated to bathroom, some back pain, tolerated well. States on menses, slightly heavier than usual.  Back to bed, warm blankets given.

## 2023-04-21 NOTE — ED Triage Notes (Addendum)
Pt just discharged from [facility] approx 5:30 tonight, states she went home, felt back pain reoccur "the same way it did Wednesday. I just don't want to go home & have something happen." Pt denies [facility], additional symptoms.    First dose xarelto approx 4p, heparin discontinued after.

## 2023-04-21 NOTE — Care Management CC44 (Signed)
Condition Code 44 Documentation Completed  Patient Details  Name: Sukhman Kocher MRN: [id number] Date of Birth: 1980/06/13   Condition Code 44 given:  Yes Patient signature on Condition Code 44 notice:  Yes Documentation of 2 MD's agreement:  Yes Code 44 added to claim:  Yes    Kingsley Plan, RN 04/21/2023, 4:03 PM

## 2023-04-21 NOTE — Discharge Summary (Signed)
DISCHARGE SUMMARY  Dawn Peters  MR#: [id number]  DOB:1980/12/25  Date of Admission: 04/20/2023 Date of Discharge: 04/21/2023  Attending Physician:Laniece Hornbaker Silvestre Gunner, MD  Patient's PCP: [facility], [place]  Disposition: D/C home   Follow-up Appts:  Follow-up Information     Ellyn Hack, MD Follow up.   Specialty: Family Medicine Why: See your primary care provider at [facility] on [address] in 5-7 days for a follow up visit. You will need to have a blood count checked to assure you are tolerating your new blood thinner, and to see how you are doing with your new blood clots. This clinic will also provide you with ongoing prescriptions for your blood thinning medication once your initial supply runs out. 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  Initial presentation: 43 year old with a history of DVT and PE 2019 treated with Eliquis, MS, HTN, and anxiety/depression who presented to the ER 2/13 with posterior right sided upper chest/back pain for 3 days.  She reported consistent compliance with Eliquis since it was first prescribed in 2019 but did undergo a procedure at [facility] approximately 2 weeks prior at which time she was advised to hold her Eliquis for ~3 days.  CTa of the chest in the ER revealed acute bilateral PE with CT evidence of right heart strain and a pulmonary infarct in the peripheral right upper lobe.   Hospital Course:  Acute pulmonary embolism with prior  history of DVT/PE 2019 -acute right popliteal DVT I am not convinced this represents Eliquis failure but instead may be related to her holding of therapy to allow for an orthopedic procedure, or perhaps inconsistent compliance - TTE revealed no evidence of RV strain and the patient was entirely clinically stable on follow-up visit 2/14 - venous duplex has noted an acute DVT of the right popliteal vein with no DVT on the left but the right lower extremity was actually unremarkable on exam -in that the patient felt fine, and that her TTE was unrevealing she was safe for discharge home and agreed with this plan -the patient felt better transitioning to a different DOAC and it was felt that this would be a reasonable approach - she is advised to follow-up with her PCP to assure that she is tolerating her Xarelto, and to consider if further evaluation for her recurrent clotting is necessary, specifically investigation for potential occult malignancies   MS on multiple neuro/psych meds which have all been continued -stable during this hospital stay   ADHD Continue usual home Adderall dose   HTN Blood pressure reasonably controlled during this admission   HLD Continue usual statin dose   Obesity - Body mass index is 33.28 kg/m.  Allergies as of 04/21/2023       Reactions   Ace Inhibitors Swelling   Amoxicillin Itching   Did it involve swelling of the face/tongue/throat, SOB, or low BP? Yes Did it involve sudden or severe rash/hives, skin peeling, or any reaction on the inside of your mouth or  nose? No Did you need to seek medical attention at a hospital or doctor's office? No When did it last happen?      unknown  If all above answers are "NO", may proceed with cephalosporin use.;   Lisinopril Swelling   Lower lip swelling        Medication List     STOP taking these medications    Eliquis 5 MG Tabs tablet Generic drug: apixaban       TAKE these medications    amantadine 100 MG  capsule Commonly known as: SYMMETREL Take 1 capsule (100 mg total) by mouth 2 (two) times daily.   amitriptyline 25 MG tablet Commonly known as: ELAVIL TAKE 1 TO 2 TABLETS (25 MG TO 50 MG) BY MOUTH AT BEDTIME   amLODipine 5 MG tablet Commonly known as: NORVASC Take 1 tablet (5 mg total) by mouth daily. What changed: when to take this   amphetamine-dextroamphetamine 20 MG 24 hr capsule Commonly known as: ADDERALL XR TAKE ONE CAPSULE DAILY AT 9AM (VIAL)   atorvastatin 20 MG tablet Commonly known as: LIPITOR Take 20 mg by mouth daily.   baclofen 10 MG tablet Commonly known as: LIORESAL TAKE ONE TABLET BY MOUTH FOUR TIMES DAILY @9AM -1PM-5PM-9PM   busPIRone 15 MG tablet Commonly known as: BUSPAR TAKE ONE TABLET BY MOUTH TWICE DAILY @ 9AM & 5PM   citalopram 20 MG tablet Commonly known as: CELEXA TAKE ONE TABLET BY MOUTH DAILY AT 9AM   dalfampridine 10 MG Tb12 TAKE ONE TABLET BY MOUTH TWICE DAILY @ 9AM & 9PM   diphenhydrAMINE 25 mg capsule Commonly known as: BENADRYL Take 25 mg by mouth every 6 (six) hours as needed for itching.   gabapentin 800 MG tablet Commonly known as: NEURONTIN TAKE ONE TABLET BY MOUTH FOUR TIMES DAILY 9AM-1PM-5PM-9PM   metoprolol succinate 50 MG 24 hr tablet Commonly known as: TOPROL-XL Take 50 mg by mouth daily.   multivitamin with minerals Tabs tablet Take 1 tablet by mouth daily.   norethindrone 0.35 MG tablet Commonly known as: MICRONOR Take 1 tablet (0.35 mg total) by mouth daily.   omeprazole 40 MG capsule Commonly known as: PRILOSEC Take 40 mg by mouth daily.   Rivaroxaban Starter Pack (15 mg and 20 mg) Commonly known as: XARELTO STARTER PACK Follow package directions: Take one 15mg  tablet by mouth twice a day. On day 22, switch to one 20mg  tablet once a day. Take with food.   SUMAtriptan 100 MG tablet Commonly known as: IMITREX ORIG} TAKE ONE TABLET BY MOUTH ONE TIME FOR UP TO ONE DOSE AS NEEDED FOR MIGRAINE. MAY REPEAT IN 2  HOURS IF HEADACHE PRESISTS OR RECURS   tamsulosin 0.4 MG Caps capsule Commonly known as: FLOMAX Take 1 capsule (0.4 mg total) by mouth daily.   Teriflunomide 14 MG Tabs Take 1 tablet (14 mg total) by mouth daily.   tiZANidine 4 MG tablet Commonly known as: ZANAFLEX TAKE ONE TABLET BY MOUTH DAILY AT 9PM EVERY NIGHT AT BEDTIME        Day of Discharge BP (!) 129/93 (BP Location: Right Arm)   Pulse 88   Temp 98.1 F (36.7 C) (Oral)   Resp 17   Ht 5\' 5"  (1.651 m)   Wt 90.7 kg   LMP 04/05/2023   SpO2 99%   BMI 33.28 kg/m   Physical Exam: General: No acute respiratory distress Lungs: Clear to auscultation bilaterally without wheezes or crackles Cardiovascular: Regular rate and rhythm without murmur gallop or  rub normal S1 and S2 Abdomen: Nontender, nondistended, soft, bowel sounds positive, no rebound, no ascites, no appreciable mass Extremities: No significant cyanosis, clubbing, or edema bilateral lower extremities  Basic Metabolic Panel: Recent Labs  Lab 04/20/23 0545 04/20/23 2347  NA 139 139  K 4.6 3.8  CL 103 108  CO2 30 25  GLUCOSE 96 102*  BUN 11 12  CREATININE 1.11* 1.36*  CALCIUM 9.0 8.2*    CBC: Recent Labs  Lab 04/20/23 0545 04/20/23 2347  WBC 6.3 5.5  NEUTROABS 3.4  --   HGB 13.2 12.2  HCT 39.2 36.0  MCV 95.6 94.0  PLT 292 292    Time spent in discharge (includes decision making & examination of pt): 30 minutes  04/21/2023, 4:12 PM   Lonia Blood, MD [facility] Office  [phone]

## 2023-04-21 NOTE — Progress Notes (Signed)
TC from pharmacy stating Pt's aptt level is elevated and to change dose from 1150 units/hr to 950 units/hr. Charge [place] notified and assisted with the dose change.

## 2023-04-21 NOTE — ED Provider Notes (Signed)
[place] EMERGENCY DEPARTMENT AT [facility] Provider Note   CSN: [id number] Arrival date & time: 04/21/23  2054     History Chief Complaint  Patient presents with   Back Pain    HPI Dawn Peters is a 43 y.o. female presenting for ongoing back pain.  Just admitted to the hospital earlier today, seen for pulmonary embolism secondary to holding Eliquis for an orthopedic procedure.  Transition to Xarelto, released from the hospital less than 4 hours ago, progress notes demonstrate ongoing pain from every physical exam during her admission.  Annotated as chronic pain and pharmacy notes.  Came directly to this hospital from her discharge for reassessment of this pain.  Patient's recorded medical, surgical, social, medication list and allergies were reviewed in the Snapshot window as part of the initial history.   Review of Systems   Review of Systems  Constitutional:  Negative for chills and fever.  HENT:  Negative for ear pain and sore throat.   Eyes:  Negative for pain and visual disturbance.  Respiratory:  Negative for cough and shortness of breath.   Cardiovascular:  Positive for chest pain. Negative for palpitations.  Gastrointestinal:  Negative for abdominal pain and vomiting.  Genitourinary:  Negative for dysuria and hematuria.  Musculoskeletal:  Positive for back pain. Negative for arthralgias.  Skin:  Negative for color change and rash.  Neurological:  Negative for seizures and syncope.  All other systems reviewed and are negative.   Physical Exam Updated Vital Signs BP (!) 145/88 (BP Location: Right Arm)   Pulse 88   Temp 98 F (36.7 C)   Resp 20   LMP 04/05/2023   SpO2 100%  Physical Exam Constitutional:      General: She is not in acute distress.    Appearance: She is not ill-appearing or toxic-appearing.  HENT:     Head: Normocephalic and atraumatic.  Eyes:     Extraocular Movements: Extraocular movements intact.     Pupils: Pupils are  equal, round, and reactive to light.  Cardiovascular:     Rate and Rhythm: Normal rate.  Pulmonary:     Effort: No respiratory distress.  Abdominal:     General: Abdomen is flat.     Tenderness: There is abdominal tenderness.  Musculoskeletal:        General: No swelling, deformity or signs of injury.     Cervical back: Normal range of motion. No rigidity.  Skin:    General: Skin is warm and dry.  Neurological:     General: No focal deficit present.     Mental Status: She is alert and oriented to person, place, and time.  Psychiatric:        Mood and Affect: Mood normal.      ED Course/ Medical Decision Making/ A&P    Procedures Procedures   Medications Ordered in ED Medications - No data to display  Medical Decision Making:   Patient presenting with acute on chronic back pain.  Discharge from the hospital less than 4 hours ago.  Vital signs stable at time of discharge.  Was documented to have back pain as part of her admission.  This seems unchanged based on her description at this time. Will place her on telemetry monitoring for a total of 90 minutes of observation for any acute hemodynamic changes or concerning vital signs.  Notably on arrival her pulse is 88, blood pressure is normal respirations are 28 and oxygen saturations are 100% on room air  and patient is in no acute distress.  Ultimately her vital signs are stable, her workup seems complete from hospital standpoint they cleared her for outpatient care management.  I do not believe the patient requires any further monitoring given reassuring results.  Patient reassured in the emergency room stable for discharge.  Disposition:  I have considered need for hospitalization, however, considering all of the above, I believe this patient is stable for discharge at this time.  Patient/family educated about specific return precautions for given chief complaint and symptoms.  Patient/family educated about follow-up with PCP.      Patient/family expressed understanding of return precautions and need for follow-up. Patient spoken to regarding all imaging and laboratory results and appropriate follow up for these results. All education provided in verbal form with additional information in written form. Time was allowed for answering of patient questions. Patient discharged.    Emergency Department Medication Summary:   Medications - No data to display    Clinical Impression:  1. Chest pain, unspecified type   2. Acute back pain, unspecified back location, unspecified back pain laterality      Data Unavailable   Final Clinical Impression(s) / ED Diagnoses Final diagnoses:  Chest pain, unspecified type  Acute back pain, unspecified back location, unspecified back pain laterality    Rx / DC Orders ED Discharge Orders     None         Glyn Ade, MD 04/21/23 2302

## 2023-04-21 NOTE — Discharge Instructions (Addendum)
IT IS VERY IMPORTANT THAT YOU SEE YOUR PRIMARY CARE PROVIDER IN 5-7 DAYS FOR A RECHECK TO MAKE SURE YOUR BLOOD COUNT IS STABLE AND THAT YOU ARE TOLERATING YOUR BLOOD CLOTS WITHOUT TROUBLE.   Information on my medicine - XARELTO (rivaroxaban)  WHY WAS XARELTO PRESCRIBED FOR YOU? Xarelto was prescribed to treat blood clots that may have been found in the veins of your legs (deep vein thrombosis) or in your lungs (pulmonary embolism) and to reduce the risk of them occurring again.  What do you need to know about Xarelto? The starting dose is one 15 mg tablet taken TWICE daily with food for the FIRST 21 DAYS then on (MARCH 7th 2025) 05/12/23  the dose is changed to one 20 mg tablet taken ONCE A DAY with your evening meal.  DO NOT stop taking Xarelto without talking to the health care provider who prescribed the medication.  Refill your prescription for 20 mg tablets before you run out.  After discharge, you should have regular check-up appointments with your healthcare provider that is prescribing your Xarelto.  In the future your dose may need to be changed if your kidney function changes by a significant amount.  What do you do if you miss a dose? If you are taking Xarelto TWICE DAILY and you miss a dose, take it as soon as you remember. You may take two 15 mg tablets (total 30 mg) at the same time then resume your regularly scheduled 15 mg twice daily the next day.  If you are taking Xarelto ONCE DAILY and you miss a dose, take it as soon as you remember on the same day then continue your regularly scheduled once daily regimen the next day. Do not take two doses of Xarelto at the same time.   Important Safety Information Xarelto is a blood thinner medicine that can cause bleeding. You should call your healthcare provider right away if you experience any of the following: Bleeding from an injury or your nose that does not stop. Unusual colored urine (red or dark brown) or unusual colored  stools (red or black). Unusual bruising for unknown reasons. A serious fall or if you hit your head (even if there is no bleeding).  Some medicines may interact with Xarelto and might increase your risk of bleeding while on Xarelto. To help avoid this, consult your healthcare provider or pharmacist prior to using any new prescription or non-prescription medications, including herbals, vitamins, non-steroidal anti-inflammatory drugs (NSAIDs) and supplements.  This website has more information on Xarelto: VisitDestination.com.br.

## 2023-04-21 NOTE — Progress Notes (Signed)
   04/21/23 1604  TOC Brief Assessment  Insurance and Status Reviewed  Patient has primary care physician Yes [facility] [address])  Prior level of function: independent  Forensic psychologist No current home services  Social Drivers of Health Review SDOH reviewed no interventions necessary  Readmission risk has been reviewed No  Transition of care needs no transition of care needs at this time       Transition of Care Department [facility]) has reviewed patient and no TOC needs have been identified at this time. We will continue to monitor patient advancement through interdisciplinary progression rounds. If new patient transition needs arise, please place a TOC consult.

## 2023-04-25 NOTE — Telephone Encounter (Signed)
Took call from phone room. Spoke w/ alongside Kesimpta/Jessica. They are getting rejection for Kesimpta: invalid med qty. I relayed PA on file: [employer]. Althia Forts has been approved from 04/13/2023-03/06/2024. Reference #[id number] for 20mg /.66ml/28day supply.   She is going to reach out to insurance to further inquire. She will call back if anything further needed from [place].

## 2023-05-04 ENCOUNTER — Other Ambulatory Visit: Payer: Self-pay | Admitting: *Deleted

## 2023-05-04 DIAGNOSIS — G35 Multiple sclerosis: Secondary | ICD-10-CM

## 2023-05-04 MED ORDER — KESIMPTA 20 MG/0.4ML ~~LOC~~ SOAJ: SUBCUTANEOUS | 0 refills | Status: AC

## 2023-05-04 MED ORDER — KESIMPTA 20 MG/0.4ML ~~LOC~~ SOAJ: 0.4000 mL | SUBCUTANEOUS | 11 refills | Status: AC

## 2023-05-08 ENCOUNTER — Emergency Department (HOSPITAL_COMMUNITY)
Admission: EM | Admit: 2023-05-08 | Discharge: 2023-05-09 | Discharge status: Against Medical Advice | Attending: Emergency Medicine | Admitting: Emergency Medicine

## 2023-05-08 ENCOUNTER — Other Ambulatory Visit: Payer: Self-pay

## 2023-05-08 ENCOUNTER — Encounter (HOSPITAL_COMMUNITY): Payer: Self-pay

## 2023-05-08 DIAGNOSIS — R0602 Shortness of breath: Secondary | ICD-10-CM | POA: Diagnosis not present

## 2023-05-08 DIAGNOSIS — R109 Unspecified abdominal pain: Secondary | ICD-10-CM | POA: Insufficient documentation

## 2023-05-08 DIAGNOSIS — Z5321 Procedure and treatment not carried out due to patient leaving prior to being seen by health care provider: Secondary | ICD-10-CM | POA: Diagnosis not present

## 2023-05-08 DIAGNOSIS — M79605 Pain in left leg: Secondary | ICD-10-CM | POA: Diagnosis present

## 2023-05-08 DIAGNOSIS — R202 Paresthesia of skin: Secondary | ICD-10-CM | POA: Insufficient documentation

## 2023-05-08 NOTE — ED Notes (Signed)
 Requested the provider to come see the patient due to symptoms.

## 2023-05-08 NOTE — Telephone Encounter (Signed)
 I called [facility].  This was a 12-minute phone call.  However, they have received an updated prescription since their call to [place] and no further clarification is needed on our end.

## 2023-05-08 NOTE — Telephone Encounter (Signed)
 [facility] called and LVM stating that they have received an Rx for the pt's Kesimpta from [employer] and they are needing to know if it is ok to go ahead and process it. Please call back at [phone] Fax number is [phone] Mon-Fri 8am-11pm and Sat 8am-6pm EST

## 2023-05-08 NOTE — ED Triage Notes (Signed)
 Patient states she is having left sided back pain. States she has a tingling sensation on the right arm going down to her hand. Tingling started at 1630 this afternoon. LNW was yesterday. Back pain has been ongoing for a few days. Patient states she had a blood clot in her lungs and admitted to the hospital on 2/13, and these symptoms feel similar.  Takes xarelto, was previously on eliquis. Denies changes in her speech, ability to walk. Also reports headache that started yesterday and shortness of breath that started tonight.

## 2023-05-09 ENCOUNTER — Emergency Department (HOSPITAL_COMMUNITY)

## 2023-05-09 ENCOUNTER — Ambulatory Visit (HOSPITAL_BASED_OUTPATIENT_CLINIC_OR_DEPARTMENT_OTHER): Admission: RE | Admit: 2023-05-09 | Source: Ambulatory Visit

## 2023-05-09 ENCOUNTER — Emergency Department (HOSPITAL_BASED_OUTPATIENT_CLINIC_OR_DEPARTMENT_OTHER)
Admission: EM | Admit: 2023-05-09 | Discharge: 2023-05-09 | Discharge status: Against Medical Advice | Source: Home / Self Care

## 2023-05-09 ENCOUNTER — Encounter (HOSPITAL_BASED_OUTPATIENT_CLINIC_OR_DEPARTMENT_OTHER): Payer: Self-pay

## 2023-05-09 ENCOUNTER — Other Ambulatory Visit: Payer: Self-pay

## 2023-05-09 DIAGNOSIS — M79605 Pain in left leg: Secondary | ICD-10-CM | POA: Insufficient documentation

## 2023-05-09 DIAGNOSIS — Z5321 Procedure and treatment not carried out due to patient leaving prior to being seen by health care provider: Secondary | ICD-10-CM | POA: Insufficient documentation

## 2023-05-09 DIAGNOSIS — R109 Unspecified abdominal pain: Secondary | ICD-10-CM | POA: Diagnosis not present

## 2023-05-09 LAB — BASIC METABOLIC PANEL
Anion gap: 10 (ref 5–15)
BUN: 11 mg/dL (ref 6–20)
CO2: 26 mmol/L (ref 22–32)
Calcium: 10 mg/dL (ref 8.9–10.3)
Chloride: 104 mmol/L (ref 98–111)
Creatinine, Ser: 1.12 mg/dL — ABNORMAL HIGH (ref 0.44–1.00)
GFR, Estimated: >60 mL/min (ref >60)
Glucose, Bld: 95 mg/dL (ref 70–99)
Potassium: 3.3 mmol/L — ABNORMAL LOW (ref 3.5–5.1)
Sodium: 140 mmol/L (ref 135–145)

## 2023-05-09 LAB — CBC
HCT: 42 % (ref 36.0–46.0)
Hemoglobin: 14.6 g/dL (ref 12.0–15.0)
MCH: 32.4 pg (ref 26.0–34.0)
MCHC: 34.8 g/dL (ref 30.0–36.0)
MCV: 93.3 fL (ref 80.0–100.0)
Platelets: 289 10*3/uL (ref 150–400)
RBC: 4.5 MIL/uL (ref 3.87–5.11)
RDW: 14.2 % (ref 11.5–15.5)
WBC: 6.6 10*3/uL (ref 4.0–10.5)
nRBC: 0 % (ref 0.0–0.2)

## 2023-05-09 LAB — TROPONIN I (HIGH SENSITIVITY): Troponin I (High Sensitivity): 3 ng/L (ref <18)

## 2023-05-09 LAB — HCG, SERUM, QUALITATIVE: Preg, Serum: NEGATIVE

## 2023-05-09 MED ORDER — OXYCODONE-ACETAMINOPHEN 5-325 MG PO TABS
1.0000 | ORAL_TABLET | ORAL | Status: DC | PRN
  Administered 2023-05-09: 1 via ORAL
  Filled 2023-05-09: qty 1

## 2023-05-09 NOTE — ED Triage Notes (Signed)
 Pt seen at [facility] earlier today and left prior to being seen by a provider

## 2023-05-09 NOTE — ED Notes (Signed)
 Pt at [facility], requested we d/c her from [facility].

## 2023-05-09 NOTE — ED Triage Notes (Signed)
 Pt presents via POV c/o right leg pain. Hx DVT on Xarelto. Reports feels like previous DVT.

## 2023-05-09 NOTE — ED Notes (Signed)
 Pt states has to leave ED due to emergency with her son and will return later.

## 2023-05-09 NOTE — ED Notes (Signed)
 Patient was waiting on a provider to come see her , said they were taking to long and she wanted to go back to waiting area

## 2023-05-09 NOTE — ED Provider Triage Note (Cosign Needed)
 Emergency Medicine Provider Triage Evaluation Note  Dawn Peters , a 43 y.o. female  was evaluated in triage.  Pt complains of left sided flank pain. Patient has been having worsening pain since yesterday. This has been constant. It is aggravated by movement, but not when breathing; however, reports some associated SOB. States that pain feels similar to when she was diagnosed with a PE on 04/20/23. Has been compliant with her Xarelto, denies missing any doses.  As an aside, reporting paresthesias in her RUE and RLE which began today at 1630. Had a headache yesterday and this AM, but none now or since paresthesias began.   Review of Systems  Positive: As above Negative: As above  Physical Exam  BP 133/88 (BP Location: Left Leg)   Pulse 73   Temp 98.1 F (36.7 C)   Resp 16   Ht 5\' 5"  (1.651 m)   Wt 88.5 kg   LMP 04/22/2023 (Exact Date)   SpO2 100%   BMI 32.45 kg/m  Gen:   Awake, no distress   Resp:  Normal effort  MSK:   Moves extremities without difficulty  Other:  Multi-colored hair.  Medical Decision Making  Medically screening exam initiated at 12:51 AM.  Appropriate orders placed.  Dawn Peters was informed that the remainder of the evaluation will be completed by another provider, this initial triage assessment does not replace that evaluation, and the importance of remaining in the ED until their evaluation is complete.  L posterior chest pain/flank pain - hx of submassive PE with RHS on 04/20/23. Presently maintaining sats of 100% on RA. No respiratory distress. Also with subjective paresthesias; hx of back pain. No headache at present. No gross focal neurologic deficits noted.  Work up initiated including labs, EKG, CXR. Will trend troponin.   Antony Madura, PA-C 05/09/23 [phone]

## 2023-05-10 ENCOUNTER — Other Ambulatory Visit: Payer: Self-pay | Admitting: Neurology

## 2023-05-10 DIAGNOSIS — G35 Multiple sclerosis: Secondary | ICD-10-CM

## 2023-05-11 ENCOUNTER — Other Ambulatory Visit: Payer: Self-pay | Admitting: *Deleted

## 2023-05-11 ENCOUNTER — Other Ambulatory Visit: Payer: Self-pay | Admitting: Neurology

## 2023-05-11 MED ORDER — AMPHETAMINE-DEXTROAMPHET ER 20 MG PO CP24: ORAL_CAPSULE | ORAL | 0 refills | Status: DC

## 2023-05-11 MED ORDER — PREDNISONE 50 MG PO TABS: ORAL_TABLET | ORAL | 0 refills | Status: DC

## 2023-05-11 NOTE — Telephone Encounter (Signed)
 Dr. Epimenio Foot- I do not see where you mentioned you were continuing pt on adderall/refilling. Ok? If so, please e-scribe  Pt sent mychart asking for refill on adderall. Last seen 04/10/23. No f.u scheduled at this time. Last refilled 03/21/23 #30.

## 2023-05-11 NOTE — Telephone Encounter (Signed)
 Called [facility] at [phone]. Spoke w/ Scarlette Calico. Transferred to [employer], Karena Addison. Spoke w/ Marcelino Duster, pharmacist and confirmed pt switching from teriflunomide to Kesimpta. Confirmed they have both the loading dose and maintenance dose prescription. Nothing further needed.

## 2023-05-15 NOTE — Telephone Encounter (Signed)
 Called pharmacy at [phone]. Spoke w/ tech/call center. Can see rx in system for cash price. Tried to put via insurance but unable. Transferred me directly to store. Spoke w/ tech. Unable to get plaid claim via insurance. I provided goodrx coupon below to help with price since insurance will not cover.

## 2023-05-15 NOTE — Telephone Encounter (Signed)
 I spoke with [facility]. They have received the Kesimpta RX and it is scheduled to be delivered tomorrow.

## 2023-05-16 ENCOUNTER — Telehealth (HOSPITAL_COMMUNITY): Payer: Self-pay | Admitting: Pharmacy Technician

## 2023-05-16 NOTE — Telephone Encounter (Signed)
 Pharmacy Patient Advocate Encounter   Received notification from Fax that prior authorization for Kesimpta 20 mg/0.4 ml Pen is required/requested.   Insurance verification completed.   The patient is insured through [employer] .   Per test claim: PA required; PA submitted to above mentioned insurance via Fax Key/confirmation #/EOC [id number] Status is pending

## 2023-05-17 ENCOUNTER — Other Ambulatory Visit: Payer: Self-pay | Admitting: Neurology

## 2023-05-17 NOTE — Telephone Encounter (Signed)
 Called pharmacy at [phone]. Rx already e-scribed 05/11/23. Spoke w/ Dorene Grebe. Ready for pick up at:  [id number] - [place], [place] - [address] AT [facility] OF [address]   I called pt. VM full, unable to LVM. Sent mychart to pt with update.

## 2023-05-18 NOTE — Telephone Encounter (Signed)
 Called [facility] and spoke w/ [place]. States Kesimpta shipped to pt and should have received 05/16/23.  She checked tracking and confirmed it was delivered 05/16/23.

## 2023-05-19 ENCOUNTER — Encounter: Payer: Self-pay | Admitting: Oncology

## 2023-05-19 ENCOUNTER — Other Ambulatory Visit (HOSPITAL_COMMUNITY): Payer: Self-pay

## 2023-05-19 ENCOUNTER — Telehealth: Payer: Self-pay | Admitting: Pharmacy Technician

## 2023-05-19 NOTE — Telephone Encounter (Signed)
 Pharmacy Patient Advocate Encounter   Received notification from CoverMyMeds that prior authorization for predniSONE 50MG  tablets is required/requested.   Insurance verification completed.   The patient is insured through [employer] .   Per test claim: [id number]

## 2023-05-24 NOTE — Telephone Encounter (Signed)
 Insurance sent a faxed request for add info on 05/19/2023 and then received a denial on 05/20/2023. It was denied due to the following-    PA Case ID #: [id number]

## 2023-05-24 NOTE — Telephone Encounter (Signed)
 Pt used goodrx coupon to pick up rx.

## 2023-05-25 ENCOUNTER — Other Ambulatory Visit: Payer: Self-pay | Admitting: Family Medicine

## 2023-05-25 DIAGNOSIS — C801 Malignant (primary) neoplasm, unspecified: Secondary | ICD-10-CM

## 2023-06-20 ENCOUNTER — Encounter: Payer: Self-pay | Admitting: Family Medicine

## 2023-06-20 ENCOUNTER — Encounter: Payer: Self-pay | Admitting: Oncology

## 2023-06-20 ENCOUNTER — Emergency Department (HOSPITAL_COMMUNITY)
Admission: EM | Admit: 2023-06-20 | Discharge: 2023-06-20 | Disposition: A | Discharge status: Home / Self Care | Attending: Emergency Medicine | Admitting: Emergency Medicine

## 2023-06-20 DIAGNOSIS — B9689 Other specified bacterial agents as the cause of diseases classified elsewhere: Secondary | ICD-10-CM

## 2023-06-20 DIAGNOSIS — S3993XA Unspecified injury of pelvis, initial encounter: Secondary | ICD-10-CM | POA: Diagnosis present

## 2023-06-20 DIAGNOSIS — N3000 Acute cystitis without hematuria: Secondary | ICD-10-CM | POA: Diagnosis not present

## 2023-06-20 DIAGNOSIS — S3141XA Laceration without foreign body of vagina and vulva, initial encounter: Secondary | ICD-10-CM | POA: Diagnosis not present

## 2023-06-20 DIAGNOSIS — Z79899 Other long term (current) drug therapy: Secondary | ICD-10-CM | POA: Diagnosis not present

## 2023-06-20 DIAGNOSIS — N760 Acute vaginitis: Secondary | ICD-10-CM | POA: Insufficient documentation

## 2023-06-20 DIAGNOSIS — Z7901 Long term (current) use of anticoagulants: Secondary | ICD-10-CM | POA: Insufficient documentation

## 2023-06-20 DIAGNOSIS — W268XXA Contact with other sharp object(s), not elsewhere classified, initial encounter: Secondary | ICD-10-CM | POA: Insufficient documentation

## 2023-06-20 DIAGNOSIS — S31512A Laceration without foreign body of unspecified external genital organs, female, initial encounter: Secondary | ICD-10-CM

## 2023-06-20 LAB — PREGNANCY, URINE: Preg Test, Ur: NEGATIVE

## 2023-06-20 LAB — URINALYSIS, ROUTINE W REFLEX MICROSCOPIC
Bacteria, UA: NONE SEEN
Bilirubin Urine: NEGATIVE
Glucose, UA: NEGATIVE mg/dL
Ketones, ur: NEGATIVE mg/dL
Nitrite: NEGATIVE
Protein, ur: 30 mg/dL — AB
Specific Gravity, Urine: 1.023 (ref 1.005–1.030)
pH: 6 (ref 5.0–8.0)

## 2023-06-20 LAB — WET PREP, GENITAL
Sperm: NONE SEEN
Trich, Wet Prep: NONE SEEN
WBC, Wet Prep HPF POC: <10 (ref <10)
Yeast Wet Prep HPF POC: NONE SEEN

## 2023-06-20 MED ORDER — POLYETHYLENE GLYCOL 3350 17 G PO PACK: 17.0000 g | PACK | Freq: Every day | ORAL | 0 refills | Status: AC

## 2023-06-20 MED ORDER — CEPHALEXIN 500 MG PO CAPS: 500.0000 mg | ORAL_CAPSULE | Freq: Three times a day (TID) | ORAL | 0 refills | Status: AC

## 2023-06-20 MED ORDER — SENNOSIDES-DOCUSATE SODIUM 8.6-50 MG PO TABS: 1.0000 | ORAL_TABLET | Freq: Every day | ORAL | 0 refills | Status: AC

## 2023-06-20 MED ORDER — METRONIDAZOLE 500 MG PO TABS
500.0000 mg | ORAL_TABLET | Freq: Once | ORAL | Status: AC
  Administered 2023-06-20: 500 mg via ORAL
  Filled 2023-06-20: qty 1

## 2023-06-20 MED ORDER — CEFTRIAXONE SODIUM 1 G IJ SOLR
500.0000 mg | Freq: Once | INTRAMUSCULAR | Status: AC
  Administered 2023-06-20: 500 mg via INTRAMUSCULAR
  Filled 2023-06-20: qty 10

## 2023-06-20 MED ORDER — DOXYCYCLINE HYCLATE 100 MG PO TABS
100.0000 mg | ORAL_TABLET | Freq: Once | ORAL | Status: AC
  Administered 2023-06-20: 100 mg via ORAL
  Filled 2023-06-20: qty 1

## 2023-06-20 MED ORDER — LIDOCAINE HCL (PF) 1 % IJ SOLN
1.0000 mL | Freq: Once | INTRAMUSCULAR | Status: AC
  Administered 2023-06-20: 2.1 mL
  Filled 2023-06-20: qty 30

## 2023-06-20 MED ORDER — METRONIDAZOLE 500 MG PO TABS: 500.0000 mg | ORAL_TABLET | Freq: Two times a day (BID) | ORAL | 0 refills | Status: AC

## 2023-06-20 NOTE — ED Triage Notes (Signed)
 Patient complains of pain to her rectum. States she gets small cuts/tears around her anus when she has a hard BM. States she's been having pain in her rectum for about a week and it still burns. Also states she is being treated for a UTI and is on antibiotics but feels she now has a bacterial infection or yeast infection.

## 2023-06-20 NOTE — Discharge Instructions (Addendum)
 Please call make an appointment with your OB/GYN if perineal skin tear does not begin to heal within the next 5 to 7 days for consideration for stitches.  No intravaginal penetration during the next 2 weeks while it heals.  Urinalysis was positive for urinary tract infection.  Keflex sent to pharmacy.  Wet mount was positive for bacterial vaginosis.  Metronidazole sent to pharmacy.  Caption for MiraLAX and Senokot sent to the pharmacy.  Take MiraLAX and Senokot daily until constipation improves.  After that continue to MiraLAX daily but stop taking Senokot.  This for an additional 7 days after constipation resolves.  You can stop at any time if diarrhea develops or cut down the dose to half a cap and/or 8mg .

## 2023-06-20 NOTE — ED Provider Notes (Signed)
 [place] [facility] AT [facility] Provider Note   CSN: [id number] Arrival date & time: 06/20/23  1937     History  Chief Complaint  Patient presents with   Rectal Pain   Urinary Frequency    Dawn Peters is a 43 y.o. female.  Patient is a 43 year old female presenting for 2 concerns.  First patient has rectal pain that occurred after a hard bowel movement.  She states she intermittently struggles with constipation.  Think she has a cut to her anus.  Denies any black or bloody stools.  Patient also presents for increased urinary frequency.  States she was recently treated for urinary tract infection.  Denies pelvic pain.  Admits to some vaginal pruritus and changes in vaginal discharge.  No concerns for sexually transmitted diseases.  Previous history of yeast infection however remote.  The history is provided by the patient. No language interpreter was used.       Home Medications Prior to Admission medications   Medication Sig Start Date End Date Taking? Authorizing Provider  cephALEXin (KEFLEX) 500 MG capsule Take 1 capsule (500 mg total) by mouth 3 (three) times daily for 7 days. 06/20/23 06/27/23 Yes Edwin Dada P, DO  metroNIDAZOLE (FLAGYL) 500 MG tablet Take 1 tablet (500 mg total) by mouth 2 (two) times daily for 10 days. 06/20/23 06/30/23 Yes Edwin Dada P, DO  polyethylene glycol (MIRALAX) 17 g packet Take 17 g by mouth daily for 20 days. 06/20/23 07/10/23 Yes Edwin Dada P, DO  senna-docusate (SENOKOT-S) 8.6-50 MG tablet Take 1 tablet by mouth daily for 7 days. 06/20/23 06/27/23 Yes Edwin Dada P, DO  amantadine (SYMMETREL) 100 MG capsule Take 1 capsule (100 mg total) by mouth 2 (two) times daily. 08/18/22   Sater, Pearletha Furl, MD  amitriptyline (ELAVIL) 25 MG tablet TAKE 1 TO 2 TABLETS (25 MG TO 50 MG) BY MOUTH AT BEDTIME 08/23/22   Sater, Pearletha Furl, MD  amLODipine (NORVASC) 5 MG tablet Take 1 tablet (5 mg total) by mouth daily. Patient taking  differently: Take 5 mg by mouth daily after breakfast. 05/21/17   Arrien, York Ram, MD  amphetamine-dextroamphetamine (ADDERALL XR) 20 MG 24 hr capsule TAKE ONE CAPSULE DAILY AT 9AM (VIAL) 05/11/23   Sater, Pearletha Furl, MD  atorvastatin (LIPITOR) 20 MG tablet Take 20 mg by mouth daily. 04/19/23   [provider]  baclofen (LIORESAL) 10 MG tablet TAKE ONE TABLET BY MOUTH FOUR TIMES DAILY @9AM -1PM-5PM-9PM 02/27/23   Sater, Pearletha Furl, MD  busPIRone (BUSPAR) 15 MG tablet TAKE ONE TABLET BY MOUTH TWICE DAILY @ 9AM & 5PM 02/20/23   Sater, Pearletha Furl, MD  citalopram (CELEXA) 20 MG tablet TAKE ONE TABLET BY MOUTH DAILY AT 9AM 02/27/23   Sater, Pearletha Furl, MD  dalfampridine 10 MG TB12 TAKE 1 TABLET BY MOUTH TWICE DAILY 05/11/23   Sater, Pearletha Furl, MD  diphenhydrAMINE (BENADRYL) 25 mg capsule Take 25 mg by mouth every 6 (six) hours as needed for itching.    [provider]  gabapentin (NEURONTIN) 800 MG tablet TAKE ONE TABLET BY MOUTH FOUR TIMES DAILY 9AM-1PM-5PM-9PM 02/20/23   Sater, Pearletha Furl, MD  KESIMPTA 20 MG/0.4ML SOAJ Inject 0.4 mLs into the skin every 30 (thirty) days. Starting at week 4 05/04/23   Sater, Pearletha Furl, MD  KESIMPTA 20 MG/0.4ML SOAJ Inject 0.67ml at week 0, 1, and 2 05/04/23   Sater, Pearletha Furl, MD  metoprolol succinate (TOPROL-XL) 50 MG 24 hr tablet Take  50 mg by mouth daily. 09/27/20   [provider]  Multiple Vitamin (MULTIVITAMIN WITH MINERALS) TABS tablet Take 1 tablet by mouth daily. 09/20/17   Darlin Drop, DO  norethindrone (MICRONOR) 0.35 MG tablet Take 1 tablet (0.35 mg total) by mouth daily. 11/19/18   Venora Maples, MD  omeprazole (PRILOSEC) 40 MG capsule Take 40 mg by mouth daily. 09/27/20   [provider]  predniSONE (DELTASONE) 50 MG tablet 12 pills (600 mg) po daily x 3 days for MS exacerbation 05/11/23   Sater, Pearletha Furl, MD  RIVAROXABAN Carlena Hurl) VTE STARTER PACK (15 & 20 MG) Follow package directions: Take one 15mg  tablet by mouth twice a  day. On day 22, switch to one 20mg  tablet once a day. Take with food. 04/21/23   Lonia Blood, MD  SUMAtriptan (IMITREX) 100 MG tablet ORIG} TAKE ONE TABLET BY MOUTH ONE TIME FOR UP TO ONE DOSE AS NEEDED FOR MIGRAINE. MAY REPEAT IN 2 HOURS IF HEADACHE PRESISTS OR RECURS 01/30/23   Sater, Pearletha Furl, MD  tamsulosin (FLOMAX) 0.4 MG CAPS capsule Take 1 capsule (0.4 mg total) by mouth daily. 04/10/23   Sater, Pearletha Furl, MD  Teriflunomide 14 MG TABS TAKE 1 TABLET BY MOUTH DAILY 05/11/23   Sater, Pearletha Furl, MD  tiZANidine (ZANAFLEX) 4 MG tablet TAKE ONE TABLET BY MOUTH DAILY AT 9PM EVERY NIGHT AT BEDTIME 10/10/22   Sater, Pearletha Furl, MD      Allergies    Ace inhibitors, Amoxicillin, and Lisinopril    Review of Systems   Review of Systems  Constitutional:  Negative for chills and fever.  HENT:  Negative for ear pain and sore throat.   Eyes:  Negative for pain and visual disturbance.  Respiratory:  Negative for cough and shortness of breath.   Cardiovascular:  Negative for chest pain and palpitations.  Gastrointestinal:  Positive for rectal pain. Negative for abdominal pain and vomiting.  Genitourinary:  Positive for frequency and vaginal discharge. Negative for dysuria, flank pain, hematuria, urgency, vaginal bleeding and vaginal pain.  Musculoskeletal:  Negative for arthralgias and back pain.  Skin:  Negative for color change and rash.  Neurological:  Negative for seizures and syncope.  All other systems reviewed and are negative.   Physical Exam Updated Vital Signs BP (!) 135/102 (BP Location: Left Arm)   Pulse 99   Temp 98.8 F (37.1 C) (Oral)   Resp 16   SpO2 98%  Physical Exam Vitals and nursing note reviewed. Exam conducted with a chaperone present.  Constitutional:      General: She is not in acute distress.    Appearance: She is well-developed.  HENT:     Head: Normocephalic and atraumatic.  Eyes:     Conjunctiva/sclera: Conjunctivae normal.  Cardiovascular:     Rate and  Rhythm: Normal rate and regular rhythm.     Heart sounds: No murmur heard. Pulmonary:     Effort: Pulmonary effort is normal. No respiratory distress.     Breath sounds: Normal breath sounds.  Abdominal:     Palpations: Abdomen is soft.     Tenderness: There is no abdominal tenderness.  Genitourinary:    Vagina: Vaginal discharge and tenderness present.     Cervix: Normal.     Rectum: Normal.     Comments: Superficial laceration on perineum with minimal bleeding.  Vaginal canal demonstrates white discharge.  No tenderness.  Non-friabile cervix.  No rectal involvement.  No rectal abscesses or rectal fissures.  No rectal bleeding. Musculoskeletal:        General: No swelling.     Cervical back: Neck supple.  Skin:    General: Skin is warm and dry.     Capillary Refill: Capillary refill takes less than 2 seconds.  Neurological:     Mental Status: She is alert.  Psychiatric:        Mood and Affect: Mood normal.     ED Results / Procedures / Treatments   Labs (all labs ordered are listed, but only abnormal results are displayed) Labs Reviewed  WET PREP, GENITAL - Abnormal; Notable for the following components:      Result Value   Clue Cells Wet Prep HPF POC PRESENT (*)    All other components within normal limits  URINALYSIS, ROUTINE W REFLEX MICROSCOPIC - Abnormal; Notable for the following components:   APPearance HAZY (*)    Hgb urine dipstick MODERATE (*)    Protein, ur 30 (*)    Leukocytes,Ua LARGE (*)    All other components within normal limits  PREGNANCY, URINE  GC/CHLAMYDIA PROBE AMP ([place]) NOT AT [facility]    EKG None  Radiology No results found.  Procedures Procedures    Medications Ordered in ED Medications  metroNIDAZOLE (FLAGYL) tablet 500 mg (has no administration in time range)  cefTRIAXone (ROCEPHIN) injection 500 mg (has no administration in time range)  lidocaine (PF) (XYLOCAINE) 1 % injection 1-2.1 mL (has no administration in time range)   doxycycline (VIBRA-TABS) tablet 100 mg (has no administration in time range)    ED Course/ Medical Decision Making/ A&P                                 Medical Decision Making Amount and/or Complexity of Data Reviewed Labs: ordered.  Risk Prescription drug management.   Patient is alert oriented x 3, no acute distress, afebrile, stable to signs.  Abdomen is soft and nontender.  Physical exam demonstrates superficial laceration on perineum with minimal bleeding.  Vaginal canal demonstrates white discharge.  No tenderness.  Non-friabile cervix.  No rectal involvement.  No rectal abscesses or rectal fissures.  No rectal bleeding.  Patient recommended for stool softeners for constipation and no intravaginal penetration over the next 2 weeks to allow perineal skin tear to heal.  She is also recommended follow-up with OB/GYN if skin tear does not begin to heal the next 7 days.  Recommended for emollients such as Vaseline to help soften the area and prevent rebleeding.  No sutures required at this time.  Patient recommended testing for gonorrhea and chlamydia and prophylactic testing.  Rocephin and Doxy given in ED.  Patient understands to follow her MyChart in the next 24 to 48 hours for results.  Wet mount was positive for bacterial vaginosis.  Patient given Flagyl in ED and sent to pharmacy.  Patient also positive for urinary tract infection.  Rocephin given in ED and Keflex sent to pharmacy.  Patient in no distress and overall condition improved here in the ED. Detailed discussions were had with the patient regarding current findings, and need for close f/u with PCP or on call doctor. The patient has been instructed to return immediately if the symptoms worsen in any way for re-evaluation. Patient verbalized understanding and is in agreement with current care plan. All questions answered prior to discharge.         Final Clinical Impression(s) / ED  Diagnoses Final diagnoses:  Bacterial  vaginitis  Acute cystitis without hematuria  Skin tear of female genital tract, initial encounter    Rx / DC Orders ED Discharge Orders          Ordered    cephALEXin (KEFLEX) 500 MG capsule  3 times daily        06/20/23 2242    metroNIDAZOLE (FLAGYL) 500 MG tablet  2 times daily        06/20/23 2242    polyethylene glycol (MIRALAX) 17 g packet  Daily        06/20/23 2245    senna-docusate (SENOKOT-S) 8.6-50 MG tablet  Daily        06/20/23 2245              Quinn Bucco, DO 06/20/23 2245

## 2023-06-21 ENCOUNTER — Other Ambulatory Visit: Payer: Self-pay | Admitting: Neurology

## 2023-06-21 LAB — GC/CHLAMYDIA PROBE AMP (~~LOC~~) NOT AT ARMC
Chlamydia: NEGATIVE
Comment: NEGATIVE
Comment: NORMAL
Neisseria Gonorrhea: NEGATIVE

## 2023-06-22 ENCOUNTER — Other Ambulatory Visit (HOSPITAL_BASED_OUTPATIENT_CLINIC_OR_DEPARTMENT_OTHER): Payer: Self-pay

## 2023-06-22 MED ORDER — AMPHETAMINE-DEXTROAMPHET ER 20 MG PO CP24: ORAL_CAPSULE | ORAL | 0 refills | Status: DC

## 2023-06-27 ENCOUNTER — Other Ambulatory Visit

## 2023-06-27 ENCOUNTER — Telehealth: Payer: Self-pay | Admitting: Neurology

## 2023-06-27 ENCOUNTER — Other Ambulatory Visit: Payer: Self-pay | Admitting: Neurology

## 2023-06-27 MED ORDER — GABAPENTIN 800 MG PO TABS: ORAL_TABLET | ORAL | 2 refills | Status: DC

## 2023-06-27 MED ORDER — TIZANIDINE HCL 4 MG PO TABS: ORAL_TABLET | ORAL | 4 refills | Status: DC

## 2023-06-27 MED ORDER — AMITRIPTYLINE HCL 25 MG PO TABS: ORAL_TABLET | ORAL | 6 refills | Status: DC

## 2023-06-27 NOTE — Telephone Encounter (Signed)
 Pt called needing a refill on her amitriptyline  (ELAVIL ) 25 MG tablet , tiZANidine  (ZANAFLEX ) 4 MG tablet  and the gabapentin  (NEURONTIN ) 800 MG tablet  She is needing them sent to the [facility]

## 2023-06-27 NOTE — Telephone Encounter (Signed)
 Last seen on 04/10/23 No 6 month follow up scheduled   I have approved amitriptyline  25 mg tablet and tizanidine  4 mg tablets.   I don't see in last visit you wanted pt to continue gabapentin ? She has Rx on file for baclofen .  Please advise

## 2023-06-28 NOTE — Telephone Encounter (Signed)
 Pt has called to see if Dr Godwin Lat has made a decision about her taking baclofen .  Pt was reminded to allow 24-48 hours for her message to be responded to.

## 2023-07-03 ENCOUNTER — Other Ambulatory Visit: Payer: Self-pay | Admitting: *Deleted

## 2023-07-03 MED ORDER — BACLOFEN 10 MG PO TABS: ORAL_TABLET | ORAL | 3 refills | Status: AC

## 2023-07-05 ENCOUNTER — Telehealth: Payer: Self-pay | Admitting: Neurology

## 2023-07-05 MED ORDER — CITALOPRAM HYDROBROMIDE 20 MG PO TABS
20.0000 mg | ORAL_TABLET | Freq: Every day | ORAL | 2 refills | Status: AC
Start: 2023-07-05 — End: ?

## 2023-07-05 MED ORDER — PREDNISONE 50 MG PO TABS: ORAL_TABLET | ORAL | 0 refills | Status: DC

## 2023-07-05 MED ORDER — BUSPIRONE HCL 15 MG PO TABS: 15.0000 mg | ORAL_TABLET | Freq: Two times a day (BID) | ORAL | 2 refills | Status: DC

## 2023-07-05 NOTE — Telephone Encounter (Signed)
 Pt is asking for a call to discuss the possible need for steroid treatment, she is dropping things and dragging left leg, please call pt to discuss.

## 2023-07-05 NOTE — Telephone Encounter (Signed)
 Last seen 04/10/23. Next appt: none scheduled. MS DMT: [place] . She last filled prednisone  50mg  05/11/23 #36.  Called pt. Sx worsened in last two weeks. No current signs of illness/infection. No new meds started recently.   Per Dr. Godwin Lat, ok to call in another rx for prednisone  50mg , take 12 tabs po qdx3 days. Sent rx to: [facility] per pt request. Asked her to call back if ineffective and we will then want to schedule appt for further evaluation.  She also asked refills for citalopram  and buspar  go to [facility] (previously went to [facility] and she no longer uses this pharmacy). I sent in refills as requested.

## 2023-07-06 ENCOUNTER — Encounter: Payer: Self-pay | Admitting: Oncology

## 2023-07-06 NOTE — Telephone Encounter (Signed)
 Pt has called to report that the medication called in by Dr Godwin Lat requires a PA, please advise.

## 2023-07-06 NOTE — Telephone Encounter (Addendum)
 Called [facility] at [phone]. Spoke w/ tech, Anda Katayama. She transferred me to pharmacy directly where I provided below info for her to fill prescription with goodrx coupon instead.   I called pt. Relayed above. She was agreeable to fill with goodrx and will f/u with pharmacy to ensure ready for pick up prior to going.

## 2023-07-08 ENCOUNTER — Encounter: Payer: Self-pay | Admitting: Oncology

## 2023-07-10 ENCOUNTER — Inpatient Hospital Stay

## 2023-07-10 ENCOUNTER — Other Ambulatory Visit: Payer: Self-pay | Admitting: *Deleted

## 2023-07-10 VITALS — BP 141/93 | HR 75 | Temp 97.7°F | Resp 18 | Ht 65.0 in | Wt 183.0 lb

## 2023-07-10 DIAGNOSIS — R12 Heartburn: Secondary | ICD-10-CM | POA: Insufficient documentation

## 2023-07-10 DIAGNOSIS — I519 Heart disease, unspecified: Secondary | ICD-10-CM | POA: Insufficient documentation

## 2023-07-10 DIAGNOSIS — Z803 Family history of malignant neoplasm of breast: Secondary | ICD-10-CM | POA: Insufficient documentation

## 2023-07-10 DIAGNOSIS — I82431 Acute embolism and thrombosis of right popliteal vein: Secondary | ICD-10-CM | POA: Insufficient documentation

## 2023-07-10 DIAGNOSIS — E538 Deficiency of other specified B group vitamins: Secondary | ICD-10-CM | POA: Diagnosis not present

## 2023-07-10 DIAGNOSIS — I2699 Other pulmonary embolism without acute cor pulmonale: Secondary | ICD-10-CM | POA: Insufficient documentation

## 2023-07-10 DIAGNOSIS — Z79899 Other long term (current) drug therapy: Secondary | ICD-10-CM | POA: Insufficient documentation

## 2023-07-10 DIAGNOSIS — Z800 Family history of malignant neoplasm of digestive organs: Secondary | ICD-10-CM | POA: Insufficient documentation

## 2023-07-10 DIAGNOSIS — G35 Multiple sclerosis: Secondary | ICD-10-CM | POA: Insufficient documentation

## 2023-07-10 LAB — CBC WITH DIFFERENTIAL (CANCER CENTER ONLY)
Abs Immature Granulocytes: 0.05 10*3/uL (ref 0.00–0.07)
Basophils Absolute: 0 10*3/uL (ref 0.0–0.1)
Basophils Relative: 1 %
Eosinophils Absolute: 0 10*3/uL (ref 0.0–0.5)
Eosinophils Relative: 0 %
HCT: 38.5 % (ref 36.0–46.0)
Hemoglobin: 13.7 g/dL (ref 12.0–15.0)
Immature Granulocytes: 1 %
Lymphocytes Relative: 25 %
Lymphs Abs: 0.9 10*3/uL (ref 0.7–4.0)
MCH: 32.5 pg (ref 26.0–34.0)
MCHC: 35.6 g/dL (ref 30.0–36.0)
MCV: 91.4 fL (ref 80.0–100.0)
Monocytes Absolute: 0.1 10*3/uL (ref 0.1–1.0)
Monocytes Relative: 3 %
Neutro Abs: 2.7 10*3/uL (ref 1.7–7.7)
Neutrophils Relative %: 70 %
Platelet Count: 201 10*3/uL (ref 150–400)
RBC: 4.21 MIL/uL (ref 3.87–5.11)
RDW: 14.3 % (ref 11.5–15.5)
WBC Count: 3.8 10*3/uL — ABNORMAL LOW (ref 4.0–10.5)
nRBC: 0 % (ref 0.0–0.2)

## 2023-07-10 LAB — CMP (CANCER CENTER ONLY)
ALT: 13 U/L (ref 0–44)
AST: 11 U/L — ABNORMAL LOW (ref 15–41)
Albumin: 4.1 g/dL (ref 3.5–5.0)
Alkaline Phosphatase: 51 U/L (ref 38–126)
Anion gap: 6 (ref 5–15)
BUN: 21 mg/dL — ABNORMAL HIGH (ref 6–20)
CO2: 26 mmol/L (ref 22–32)
Calcium: 9 mg/dL (ref 8.9–10.3)
Chloride: 107 mmol/L (ref 98–111)
Creatinine: 1.13 mg/dL — ABNORMAL HIGH (ref 0.44–1.00)
GFR, Estimated: >60 mL/min (ref >60)
Glucose, Bld: 136 mg/dL — ABNORMAL HIGH (ref 70–99)
Potassium: 3.4 mmol/L — ABNORMAL LOW (ref 3.5–5.1)
Sodium: 139 mmol/L (ref 135–145)
Total Bilirubin: 0.8 mg/dL (ref 0.0–1.2)
Total Protein: 6.3 g/dL — ABNORMAL LOW (ref 6.5–8.1)

## 2023-07-10 LAB — PROTIME-INR
INR: 0.9 (ref 0.8–1.2)
Prothrombin Time: 12.8 s (ref 11.4–15.2)

## 2023-07-10 LAB — VITAMIN B12: Vitamin B-12: 368 pg/mL (ref 180–914)

## 2023-07-10 NOTE — Progress Notes (Signed)
 [facility] CONSULT NOTE  Patient Care Team: Dawn Newness, MD as PCP - General (Neurology)  First episode: PICC associated DVT on the RUE. Second episode: 2019 LLE DVT and PE Third episode: 2025 RLE DVT and PE Setting: unprovoked Treatment: Xarelto   ASSESSMENT & PLAN:  43 y.o.female with history of hypoproliferative anemia, CKD3, MS, GERD, LLE DVT and PE in 2019, multiple sclerosis, HTN, anxiety/depression referred to [facility] for history of recurrent DVT and PE.   History reviewed with patient. She had 2 episodes of blood clot appeared to be unprovoked. We discussed difference between provoked vs unprovoked settings. We discussed anticoagulation is important in the initial period for treatment. In the long term, it became more of a preventative measure for future events.   She is interested in thrombophilia testing. Patient was informed even if these are negative and the family history is negative, this does not rule out an inheritable component to hypercoagulability. Patient can still develop recurrent venous thrombosis with negative testing. For patients with unprovoked VTE, the risks of recurrent VTE after completing a course of anticoagulant therapy have been estimated to be 10% by 2 years and >30% by 10 years. ASH 2020.  Of note she has a pending CT CAP to rule out occult malignancy.  Her recent CBC was normal. Assessment & Plan Recurrent pulmonary emboli (HCC) Continue Xarelto  20 mg daily. PCP gave prescription Thrombophilia testing today. Follow up in about 3 months Avoid elective surgery for 3 months. Low serum vitamin B12 Labs today.   Patient education for risk factors and prevention of clotting We talked about risk factors.   Strong risk factors including fractures of lower limb, hospitalization for severe illness, such as heart failure, myocardial infarction, spinal cord injury, major trauma, hip or knee replacement,  and previous VTE. avoid prolonged immobilization and moving extremities every 1-2 hours during long car rides or flights.  Taking a break and moving extremities if working in a job setting with prolonged sitting.   Avoid dehydration, especially in high altitude. Avoid cigarette smoking Avoid use of estrogen-containing contraceptive in women.   Maintaining healthy lifestyle to prevent development of diabetes.  Weight loss if BMI over 30.  Regular exercises but not extreme.   Other risk factors for clotting are surgery, hospitalization, inflammatory disease or severe infection, trauma or injuries from inflammatory state and stasis. If developing one-sided leg swelling, pain, color change, chest pain, sudden short of breath, difficulty taking deep breaths, taking deep breath with chest discomfort or pain, dizziness or heart racing sensation, go to the emergency room immediately for evaluation. If developing trauma, uncontrolled bleeding, such as bloody stools report ED immediately. Avoid NSAIDs, aspirin while on blood thinner.  Discussed bleeding precautions and avoid high risk activities for falling while on anticoagulation. Anticoagulants do not cause bleeding.  Rather, if bleeding occurs when one is on anticoagulation, it may take longer for the bleeding to stop due to reduce coagulation capacity.  I reviewed with the patient about the plan for care for recurrent DVT/PE.   Orders Placed This Encounter  Procedures   CBC with Differential (Cancer Center Only)    Standing Status:   Future    Expiration Date:   07/09/2024   CMP (Cancer Center only)    Standing Status:   Future    Expiration Date:   07/09/2024   Vitamin B12    Standing Status:   Future    Expiration Date:   07/09/2024  Methylmalonic acid, serum    Standing Status:   Future    Expiration Date:   08/10/2023   Beta-2-glycoprotein i abs, IgG/M/A    Standing Status:   Future    Expiration Date:   07/09/2024   Cardiolipin antibodies,  IgM+IgG    Standing Status:   Future    Expiration Date:   07/09/2024   Lupus anticoagulant panel    Standing Status:   Future    Expiration Date:   07/09/2024   Protein C, total    Standing Status:   Future    Expiration Date:   07/09/2024   PROTEIN S PANEL    Standing Status:   Future    Expiration Date:   07/09/2024   Prothrombin gene mutation    Standing Status:   Future    Expiration Date:   07/09/2024   Protime-INR    Standing Status:   Future    Expiration Date:   07/09/2024   Factor 5 leiden    Standing Status:   Future    Expiration Date:   07/09/2024    All questions were answered. The patient knows to call the clinic with any problems, questions or concerns.   [name], MD 5/5/20253:10 PM  CHIEF COMPLAINTS/PURPOSE OF CONSULTATION:  VTE  HISTORY OF PRESENTING ILLNESS:  Dawn Peters 43 y.o. female is here because of VTE. 09/18/17 CTA 1. Bilateral pulmonary emboli involving the right upper lobe, right lower lobe and lingular segmental branches. No CT evidence of right heart strain. 2. No pulmonary infarction. 09/18/17 US : Left: There is evidence of acute DVT in the Popliteal vein, and Posterior Tibial veins.   Report Eliquis  was held for 3 days prior to procedure for spinal injection and she resumed it immediately after that.  04/20/23 CTA: 1. Positive for acute bilateral PE with CT evidence of right heart strain (RV/LV Ratio = 1.0) consistent with at least submassive (intermediate risk) PE. 2. Small wedge-shaped opacity in the peripheral right upper lobe could reflect early pulmonary infarct.  04/20/23 US : RIGHT:  - Findings consistent with acute deep vein thrombosis involving the right popliteal vein.   Report of right chest pain resolved quickly. Never leg pain, swelling.   Report she has been on birth control without estrogen since 2001 or 2002.  Due to MS, she has weakness on the left leg and uses wheel chair due to weakness. Report currently stable on  treatment. No history of IBD, RA or other autoimmune disease.  She has one biologic brother, parents without VTE.  She had one vaginal delivery w/o blood clot.  Appetite is good, no night sweats, weight loss, mass or lump. Eating without change vs 6-12 months ago. No coughing, vomiting, hematemesis, stomach pain, constipation, diarrhea, stool change, blood stool, melena, bloody urine, uterine bleeding. She still has periods, not changed.  Chronic heartburn taking omeprazole. She feels heartburn if missing it.  No missing doses.  MEDICAL HISTORY:  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   left lower extremity-- treated with eliquis    Gait disturbance    due to MS   GERD (gastroesophageal reflux disease)    watch diet   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 MS  treatment's  s/p  THA   History of DVT of lower extremity 2007   History of encephalopathy 04/2017   acute metabolic encephalopathy secondary to MS,  resolved   History of MRSA infection 2004   right hip infection post THA   History of pulmonary embolus (PE) 2005   Hydronephrosis, left    w/ acute kidney injury 02/ 2019   Hypertension    Left-sided weakness    due to MS   Migraines    MS (multiple sclerosis) [facility]) neurologist-  dr Zadie Herter   dx 2000--   Muscle spasticity    Neurogenic bladder    due to MS   Pulmonary embolism, bilateral (HCC) 09/17/2017   treated w/ eliquis    Strains to urinate    Urgency of urination     SURGICAL HISTORY: Past Surgical History:  Procedure Laterality Date   CYSTOSCOPY WITH RETROGRADE PYELOGRAM, URETEROSCOPY AND STENT PLACEMENT Bilateral 11/29/2017   Procedure: BILATERAL  RETROGRADE PYELOGRAM, LEFT DIAGNOSIC URETEROSCOPY AND STENT LEFT PLACEMENT;  Surgeon:  Andrez Banker, MD;  Location: [facility];  Service: Urology;  Laterality: Bilateral;   HIP ARTHROSCOPY Left 07-05-2013   dr pill @[facility]    iliopsosas release, synovectomy   REVISION TOTAL HIP ARTHROPLASTY Right 03-28-2003    dr Bernard Brick @[facility]    TOTAL HIP ARTHROPLASTY Bilateral left 11-05-2002  dr Bernard Brick @[facility] ;   right 06/ 2004  @[facility]     SOCIAL HISTORY: Social History   Socioeconomic History   Marital status: Single    Spouse name: Not on file   Number of children: Not on file   Years of education: Not on file   Highest education level: Not on file  Occupational History   Not on file  Tobacco Use   Smoking status: Never   Smokeless tobacco: Never  Vaping Use   Vaping status: Never Used  Substance and Sexual Activity   Alcohol use: No   Drug use: No   Sexual activity: Not Currently  Other Topics Concern   Not on file  Social History Narrative   Not on file   Social Drivers of Health   Financial Resource Strain: Low Risk  (04/13/2021)   Received from [facility], [facility]   Overall Financial Resource Strain (CARDIA)    Difficulty of Paying Living Expenses: Not very hard  Food Insecurity: No Food Insecurity (04/20/2023)   Hunger Vital Sign    Worried About Running Out of Food in the Last Year: Never true    Ran Out of Food in the Last Year: Never true  Transportation Needs: No Transportation Needs (04/20/2023)   PRAPARE - Administrator, Civil Service (Medical): No    Lack of Transportation (Non-Medical): No  Physical Activity: Unknown (04/13/2021)   [facility]   Exercise Vital Sign    Days of Exercise per Week: 1 day    Minutes of Exercise per Session: Not on file  Stress: No Stress Concern Present (04/13/2021)   Received from [facility], [facility] - Occupational Stress Questionnaire    Feeling of Stress : Only a little  Social Connections: Unknown (07/05/2021)    Received from [facility], [facility]   Social Network    Social Network: Not on file  Recent Concern: Social Connections - Moderately Isolated (04/13/2021)   Received from [facility], [facility]   Social Connection and Isolation Panel [NHANES]    Frequency of Communication  with Friends and Family: More than three times a week    Frequency of Social Gatherings with Friends and Family: Twice a week    Attends Religious Services: More than 4 times per year    Active Member of [employer] or Organizations: No    Attends Banker Meetings: Never    Marital Status: Separated  Intimate Partner Violence: Not At Risk (04/20/2023)   Humiliation, Afraid, Rape, and Kick questionnaire    Fear of Current or Ex-Partner: No    Emotionally Abused: No    Physically Abused: No    Sexually Abused: No    FAMILY HISTORY: Family History  Problem Relation Age of Onset   Healthy Mother    Healthy Father    Colon cancer Paternal Grandmother    Breast cancer Paternal Grandmother 62   Hypertension Other    Diabetes Other     ALLERGIES:  is allergic to ace inhibitors, amoxicillin, and lisinopril .  MEDICATIONS:  Current Outpatient Medications  Medication Sig Dispense Refill   amantadine  (SYMMETREL ) 100 MG capsule Take 1 capsule (100 mg total) by mouth 2 (two) times daily. 180 capsule 3   amitriptyline  (ELAVIL ) 25 MG tablet TAKE 1 TO 2 TABLETS (25 MG TO 50 MG) BY MOUTH AT BEDTIME 180 tablet 6   amLODipine  (NORVASC ) 5 MG tablet Take 1 tablet (5 mg total) by mouth daily. (Patient taking differently: Take 5 mg by mouth daily after breakfast.) 90 tablet 0   amphetamine -dextroamphetamine  (ADDERALL  XR) 20 MG 24 hr capsule TAKE ONE CAPSULE DAILY AT 9AM (VIAL) 30 capsule 0   atorvastatin  (LIPITOR) 20 MG tablet Take 20 mg by mouth daily.     baclofen  (LIORESAL ) 10 MG tablet TAKE ONE TABLET BY MOUTH FOUR TIMES DAILY @9AM -1PM-5PM-9PM 360 tablet 3   busPIRone  (BUSPAR ) 15 MG tablet Take 1 tablet (15  mg total) by mouth 2 (two) times daily. 180 tablet 2   citalopram  (CELEXA ) 20 MG tablet Take 1 tablet (20 mg total) by mouth daily. 90 tablet 2   dalfampridine  10 MG TB12 TAKE 1 TABLET BY MOUTH TWICE DAILY 60 tablet 2   diphenhydrAMINE  (BENADRYL ) 25 mg capsule Take 25 mg by mouth every 6 (six) hours as needed for itching.     gabapentin  (NEURONTIN ) 800 MG tablet 1 p.o. 4 times daily 360 tablet 2   KESIMPTA  20 MG/0.4ML SOAJ Inject 0.4 mLs into the skin every 30 (thirty) days. Starting at week 4 0.4 mL 11   KESIMPTA  20 MG/0.4ML SOAJ Inject 0.79ml at week 0, 1, and 2 1.2 mL 0   metoprolol  succinate (TOPROL -XL) 50 MG 24 hr tablet Take 50 mg by mouth daily.     Multiple Vitamin (MULTIVITAMIN WITH MINERALS) TABS tablet Take 1 tablet by mouth daily. 30 tablet 0   norethindrone  (MICRONOR ) 0.35 MG tablet Take 1 tablet (0.35 mg total) by mouth daily. 1 Package 11   omeprazole (PRILOSEC) 40 MG capsule Take 40 mg by mouth daily.     polyethylene glycol (MIRALAX ) 17 g packet Take 17 g by mouth daily for 20 days. 20 packet 0   predniSONE  (DELTASONE ) 50 MG tablet 12 pills (600 mg) po daily x 3 days for MS exacerbation 36 tablet 0   SUMAtriptan  (IMITREX ) 100 MG tablet ORIG} TAKE ONE TABLET BY MOUTH ONE TIME FOR UP TO ONE DOSE AS NEEDED FOR MIGRAINE. MAY REPEAT IN 2 HOURS IF HEADACHE PRESISTS OR RECURS 10 tablet 0   tamsulosin  (FLOMAX ) 0.4 MG CAPS capsule Take 1 capsule (  0.4 mg total) by mouth daily. 90 capsule 3   Teriflunomide  14 MG TABS TAKE 1 TABLET BY MOUTH DAILY 30 tablet 4   tiZANidine  (ZANAFLEX ) 4 MG tablet TAKE ONE TABLET BY MOUTH DAILY AT 9PM EVERY NIGHT AT BEDTIME 30 tablet 4   No current facility-administered medications for this visit.   Facility-Administered Medications Ordered in Other Visits  Medication Dose Route Frequency Provider Last Rate Last Admin   0.9 %  sodium chloride  infusion (Manually program via Guardrails IV Fluids)  250 mL Intravenous Once Shadad, Firas N, MD       acetaminophen   (TYLENOL ) 325 MG tablet            diphenhydrAMINE  (BENADRYL ) 25 mg capsule             REVIEW OF SYSTEMS:   All relevant systems were reviewed with the patient and are negative.  PHYSICAL EXAMINATION:  Vitals:   07/10/23 1425  BP: (!) 141/93  Pulse: 75  Resp: 18  Temp: 97.7 F (36.5 C)  SpO2: 100%   Filed Weights   07/10/23 1425  Weight: 183 lb (83 kg)    GENERAL: alert, no distress and comfortable SKIN: skin color normal LUNGS: Effort normal and no respiratory distress.  Clear to auscultation HEART: regular rate & rhythm ABDOMEN: abdomen soft, non-tender Musculoskeletal: no edema.  LABORATORY DATA:  I have reviewed the data as listed New labs ordered.  RADIOGRAPHIC STUDIES: I have personally reviewed the radiological images relevant to the visit.

## 2023-07-11 ENCOUNTER — Other Ambulatory Visit (HOSPITAL_COMMUNITY): Payer: Self-pay

## 2023-07-11 ENCOUNTER — Encounter: Payer: Self-pay | Admitting: Oncology

## 2023-07-11 LAB — PROTEIN S PANEL
Protein S Activity: 77 % (ref 63–140)
Protein S Ag, Free: 116 % (ref 61–136)
Protein S Ag, Total: 90 % (ref 60–150)

## 2023-07-11 LAB — PROTEIN C, TOTAL: Protein C, Total: 140 % (ref 60–150)

## 2023-07-11 LAB — LUPUS ANTICOAGULANT PANEL
DRVVT: 34.7 s (ref 0.0–47.0)
PTT Lupus Anticoagulant: 23.8 s (ref 0.0–43.5)

## 2023-07-11 NOTE — Telephone Encounter (Addendum)
 Pharmacy Patient Advocate Encounter  Received notification from [employer] that Prior Authorization for Kesimpta  20 mg/0.4 ml Pen  has been APPROVED from 05/16/2023 to 07/05/2023. Unable to obtain price due to refill too soon rejection, last fill date 06/19/2023 next available fill date05/07/2023  [facility] insurance coverage expired on 07/05/2023 Patient's New Insurance AARP/[employer] copay is $0.00

## 2023-07-12 LAB — BETA-2-GLYCOPROTEIN I ABS, IGG/M/A
Beta-2 Glyco I IgG: <9 GPI IgG units (ref 0–20)
Beta-2-Glycoprotein I IgA: <9 GPI IgA units (ref 0–25)
Beta-2-Glycoprotein I IgM: <9 GPI IgM units (ref 0–32)

## 2023-07-12 LAB — CARDIOLIPIN ANTIBODIES, IGM+IGG
Anticardiolipin IgG: <9 GPL U/mL (ref 0–14)
Anticardiolipin IgM: 9 [MPL'U]/mL (ref 0–12)

## 2023-07-13 LAB — METHYLMALONIC ACID, SERUM: Methylmalonic Acid, Quantitative: 202 nmol/L (ref 0–378)

## 2023-07-14 LAB — PROTHROMBIN GENE MUTATION

## 2023-07-17 LAB — FACTOR 5 LEIDEN

## 2023-07-18 ENCOUNTER — Ambulatory Visit: Payer: Self-pay

## 2023-07-18 ENCOUNTER — Ambulatory Visit
Admission: RE | Admit: 2023-07-18 | Discharge: 2023-07-18 | Disposition: A | Discharge status: Home / Self Care | Source: Ambulatory Visit | Attending: Family Medicine | Admitting: Family Medicine

## 2023-07-18 DIAGNOSIS — C801 Malignant (primary) neoplasm, unspecified: Secondary | ICD-10-CM

## 2023-07-18 NOTE — Telephone Encounter (Signed)
 TC to inform of the below message by Dr. Alita Irwin. No answer; left VM w/ information and f/u appts in August.

## 2023-07-18 NOTE — Telephone Encounter (Signed)
-----   Message from [name] sent at 07/18/2023  8:57 AM EDT ----- Ethyl Hering Please let her know of Negative thrombophilia testing. This does not mean she is not at risk of blood clot. It only means the ones we know of we can test are negative. Her personal history is very important. No change in her blood thinner. Follow up as scheduled. Thanks

## 2023-07-27 ENCOUNTER — Other Ambulatory Visit: Payer: Self-pay | Admitting: Neurology

## 2023-07-27 MED ORDER — GABAPENTIN 800 MG PO TABS: ORAL_TABLET | ORAL | 2 refills | Status: DC

## 2023-07-27 MED ORDER — AMPHETAMINE-DEXTROAMPHET ER 20 MG PO CP24: ORAL_CAPSULE | ORAL | 0 refills | Status: DC

## 2023-07-27 NOTE — Telephone Encounter (Signed)
 Pt is requesting a refill for gabapentin  (NEURONTIN ) 800 MG tablet & amphetamine -dextroamphetamine  (ADDERALL  XR) 20 MG 24 hr capsule.  Pharmacy: [facility] [phone]

## 2023-07-27 NOTE — Telephone Encounter (Signed)
 Dr.Athar your are work in provider  Last seen on 04/10/23 Follow up scheduled on 10/09/23           amphetamine -dextroamphetamine  (ADDERALL  XR) 20 MG 24 hr capsule 06/22/2023 -- 30 capsule 0 [id number]...   TAKE ONE CAPSULE DAILY AT 9AM (VIAL)        Rx pending to be signed

## 2023-08-21 ENCOUNTER — Encounter: Payer: Self-pay | Admitting: Oncology

## 2023-08-23 ENCOUNTER — Other Ambulatory Visit: Payer: Self-pay | Admitting: Neurology

## 2023-08-23 ENCOUNTER — Telehealth: Payer: Self-pay | Admitting: Neurology

## 2023-08-23 ENCOUNTER — Telehealth: Payer: Self-pay

## 2023-08-23 NOTE — Telephone Encounter (Signed)
 Last seen on 04/10/23 Follow up scheduled 10/09/22

## 2023-08-23 NOTE — Telephone Encounter (Signed)
 Called pt and let her know that her script was sent to [facility] and pt stated that she doesn't use that pharmacy anymore, would like scripts to be sent to [facility] on [address]. Pt will need to talk to her pharmacy for them to cancel her scripts in order for her us  to send in refills to new pharmacy.  Please see other phone note with pharmacy in regards to this phone note.

## 2023-08-23 NOTE — Telephone Encounter (Signed)
 Called pt's pharmacy and they stated that pt would have to call to cancel her prescriptions due to the information not matching what was in the data base. Pt's name and date of birth matched but address and phone number didn't match. Pharmacy said they would reach out to the pt for her to cancel her scripts with them.

## 2023-08-23 NOTE — Telephone Encounter (Signed)
 Pt called and stated that she is needig a refill on her tiZANidine  (ZANAFLEX ) 4 MG tablet and her amitriptyline  (ELAVIL ) 25 MG tablet sent to the [facility] on [address]

## 2023-08-24 ENCOUNTER — Other Ambulatory Visit: Payer: Self-pay

## 2023-08-24 ENCOUNTER — Telehealth: Payer: Self-pay | Admitting: Neurology

## 2023-08-24 MED ORDER — AMITRIPTYLINE HCL 25 MG PO TABS: ORAL_TABLET | ORAL | 6 refills | Status: DC

## 2023-08-24 MED ORDER — TIZANIDINE HCL 4 MG PO TABS: ORAL_TABLET | ORAL | 4 refills | Status: AC

## 2023-08-24 NOTE — Telephone Encounter (Signed)
 Called pt and let her know that I have sent her scripts to her new pharmacy [facility] on [address].

## 2023-08-24 NOTE — Telephone Encounter (Signed)
 error

## 2023-08-24 NOTE — Telephone Encounter (Signed)
 Called pt back and asked her if her previous pharmacy [facility]  had reached out to her yesterday to cancel her scripts so that we can transfer her medication to her new pharmacy. Let pt know that I tried to cancel her scripts yesterday and wasn't able to due to the address and phone number not matching up in their data base in order for me to cancel it. Pt states she never received a call. Gave pt [facility] number [phone]  for her to call and cancel her scripts and then call us  back so we can send scripts to new pharmacy [facility] on [address]. Pt states that she was going to call now and then call us  back.

## 2023-08-24 NOTE — Telephone Encounter (Signed)
 Pt called stating that she called [facility] and they informed her that she is inactive in their system. Please advise.

## 2023-09-12 ENCOUNTER — Other Ambulatory Visit: Payer: Self-pay | Admitting: Neurology

## 2023-09-13 NOTE — Telephone Encounter (Signed)
 Last seen on 04/10/23 Follow up scheduled on 10/09/23

## 2023-09-19 ENCOUNTER — Telehealth: Payer: Self-pay | Admitting: Neurology

## 2023-09-19 NOTE — Telephone Encounter (Signed)
 Dawn Peters- can you help assist with her getting copy of last office note? This will have her diagnosis listed

## 2023-09-19 NOTE — Telephone Encounter (Signed)
 Spoke w/ Adrien. She called pt. Could not reach, will try again later today.

## 2023-09-19 NOTE — Telephone Encounter (Signed)
 Pt called to request a paperwork to be faxed or emailed to her stating her Diagnoses . Pt did not say what for and Pt did not have fax number to give . Pt is requesting to speak to Nurse

## 2023-09-28 ENCOUNTER — Other Ambulatory Visit: Payer: Self-pay | Admitting: Neurology

## 2023-09-29 ENCOUNTER — Encounter: Payer: Self-pay | Admitting: Oncology

## 2023-09-29 NOTE — Telephone Encounter (Signed)
 Last seen on 04/10/23 Follow up scheduled on 10/09/23

## 2023-10-02 ENCOUNTER — Encounter: Payer: Self-pay | Admitting: Oncology

## 2023-10-03 ENCOUNTER — Other Ambulatory Visit: Payer: Self-pay | Admitting: Neurology

## 2023-10-03 MED ORDER — AMITRIPTYLINE HCL 25 MG PO TABS: ORAL_TABLET | ORAL | 0 refills | Status: DC

## 2023-10-03 MED ORDER — AMPHETAMINE-DEXTROAMPHET ER 20 MG PO CP24: ORAL_CAPSULE | ORAL | 0 refills | Status: DC

## 2023-10-03 NOTE — Telephone Encounter (Signed)
 Pt called to request refill on Medication   amitriptyline  (ELAVIL ) 25 MG tablet   amphetamine -dextroamphetamine  (ADDERALL  XR) 20 MG 24 hr capsule   Pt  would like medication to be sent to    [id number] - [place], [place] - [address] AT [facility] [address] (Ph: [phone])

## 2023-10-03 NOTE — Telephone Encounter (Signed)
 Last seen on 04/10/23 Follow up scheduled on 10/09/23   Dispensed Days Supply Quantity Provider Pharmacy  AMPHETAMINE /DEXTROAMPHETAMINE   20 MG CP24 07/28/2023 30 30 capsule Athar, Saima, MD [facility]...     Rx pending to be signed

## 2023-10-09 ENCOUNTER — Ambulatory Visit (INDEPENDENT_AMBULATORY_CARE_PROVIDER_SITE_OTHER): Admitting: Neurology

## 2023-10-09 ENCOUNTER — Encounter: Payer: Self-pay | Admitting: Neurology

## 2023-10-09 VITALS — BP 112/75 | HR 77 | Ht 66.0 in | Wt 170.0 lb

## 2023-10-09 DIAGNOSIS — G35 Multiple sclerosis: Secondary | ICD-10-CM

## 2023-10-09 DIAGNOSIS — R5383 Other fatigue: Secondary | ICD-10-CM | POA: Diagnosis not present

## 2023-10-09 DIAGNOSIS — R531 Weakness: Secondary | ICD-10-CM

## 2023-10-09 DIAGNOSIS — R269 Unspecified abnormalities of gait and mobility: Secondary | ICD-10-CM

## 2023-10-09 DIAGNOSIS — Z79899 Other long term (current) drug therapy: Secondary | ICD-10-CM | POA: Diagnosis not present

## 2023-10-09 DIAGNOSIS — M21372 Foot drop, left foot: Secondary | ICD-10-CM

## 2023-10-09 DIAGNOSIS — N183 Chronic kidney disease, stage 3 unspecified: Secondary | ICD-10-CM

## 2023-10-09 MED ORDER — PREGABALIN 150 MG PO CAPS: 150.0000 mg | ORAL_CAPSULE | Freq: Two times a day (BID) | ORAL | 1 refills | Status: AC

## 2023-10-09 NOTE — Progress Notes (Signed)
 [facility]  PATIENT: Dawn Peters DOB: 11-Mar-1980  REFERRING DOCTOR OR PCP:  Dr. Colton SOURCE: Patient, records in EMR, lab results and radiology reports in EMR, MRI images reviewed on PACS.  _________________________________   HISTORICAL  CHIEF COMPLAINT:  Chief Complaint  Patient presents with   Follow-up    Pt in room 10. Alone. Here for MS follow up. On kesimpta .  Pt fell last night and this morning, has left side weakness. Pt said prednisone  only medication that helps.     HISTORY OF PRESENT ILLNESS:  Dawn Peters is a 43 y.o. woman who was diagnosed with relapsing remitting MS in 2000.     Update 10/09/2023 She is on Kesimpta  since early 2025 and denies any side effects.   She has not had any exacerbations.  She has had a couple falls over the last month.  The left side gives out on her.   She is walking with a cane today.  She can take some steps inside of the room without her cane.  She has had more progressive change in left leg over past few years.    Her strength always feels better if shegets an IV steroid and we discussed difference between relapses and fluctuations.       Dysesthesias on the left side of the body are better with gabapentin .  She denies any sleepiness.      She has urinary urgency and hesitancy.  She is on tamsulosin .   She sees urology.     She has stage 3 CKD and sees Nephrology at [facility] (last crt was 1.41  Vision is stable.    Her fatigue is about the same.  She sleeps well most nights.  Depression and anxiety are better with the citalopram  and anxiety is better with BuSpar  .   She is taking vitamin D  for her deficiency.  She has 2-3 migraines a month, helped by sumatriptan .   She continues to report back pain and left leg pain that radiates.  She was seeing  [facility] for Pain Management annd would like a referral to a differntn provider.     MS history:   She was diagnosed with  MS in 2000 after presenting with gait difficulties, numbness and headaches. An MRI of the brain was consistent with MS. She was then started on Betaseron. She had difficulty tolerating Betaseron and at some point switched to Novantrone. She took Novantrone every 3 months for a year or so. A little later, she was placed on Tysabri and she stayed on it for a couple of years. However, she was JCV-positive and switched off. She did well on Tysabri. She had been on Tecfidera  since 2015. Her MRI from June 2015 showed that there had been no changes compared to an MRI from 2014.   However, she had an exacerbation March 2017.   The 08/07/2013 MRI of the brain shows multiple T2/FLAIR hyperintense foci located in the cerebellum, middle cerebellar peduncles, pons and in the periventricular white matter of the hemispheres in a pattern and configuration consistent with chronic demyelinating plaque associated with MS. There were no acute findings. There was no change compared to the previous MRI from 01/16/2013 that was also reviewed. She switched to Lemtrada in July,2017/Aug 2018.   Due to progression she was shortly on Ocrevus in 2020 and then switched to Zeposia  January 2021. Due to persistent low lymphocytes (even on qod dosing), Zeposia  was dicontinued 07/01/2021.  She  started teriflunomide  about her MS was worsening and Kesimpta  was started in March 2025.   IMAGING: MRI cervical  spine 08/31/2020 showed subtle T2 hyperintense signal within the cord and there were no acute findings.  MRI cervical and thoracic spine MRI 11/21/2020  MRI brain 08/31/2020 showed Unchanged distribution of white matter lesions in a pattern compatible with multiple sclerosis. No active demyelinating lesions.  REVIEW OF SYSTEMS: Constitutional: No fevers, chills, sweats, or change in appetite.   She has fatigue.  Some insomnia. Eyes: No visual changes, double vision, eye pain Ear, nose and throat: No hearing loss, ear pain, nasal congestion,  sore throat Cardiovascular: No chest pain, palpitations Respiratory:  No shortness of breath at rest or with exertion.   No wheezes GastrointestinaI: No nausea, vomiting, diarrhea, abdominal pain, fecal incontinence Genitourinary:  No dysuria.   She has hesitancy. Sometimes she has difficulty emptying completely Flomax  has helped urinary hesitancy. 1 x nocturia. Musculoskeletal: She notes pain in the left leg and some back pain Integumentary: No rash, pruritus, skin lesions Neurological: as above Psychiatric: Mild depression at this time.  Moderate anxiety Endocrine: No palpitations, diaphoresis, change in appetite, change in weigh or increased thirst Hematologic/Lymphatic:  No anemia, purpura, petechiae. Allergic/Immunologic: No itchy/runny eyes, nasal congestion, recent allergic reactions, rashes  ALLERGIES: Allergies  Allergen Reactions   Ace Inhibitors Swelling   Amoxicillin Itching    Did it involve swelling of the face/tongue/throat, SOB, or low BP? Yes Did it involve sudden or severe rash/hives, skin peeling, or any reaction on the inside of your mouth or nose? No Did you need to seek medical attention at a hospital or doctor's office? No When did it last happen?      unknown  If all above answers are "NO", may proceed with cephalosporin use.;   Lisinopril  Swelling    Lower lip swelling    HOME MEDICATIONS:  Current Outpatient Medications:    amantadine  (SYMMETREL ) 100 MG capsule, TAKE 1 CAPSULE(100 MG) BY MOUTH TWICE DAILY, Disp: 180 capsule, Rfl: 0   amitriptyline  (ELAVIL ) 25 MG tablet, TAKE 1 TO 2 TABLETS (25 MG TO 50 MG) BY MOUTH AT BEDTIME, Disp: 180 tablet, Rfl: 0   amLODipine  (NORVASC ) 5 MG tablet, Take 1 tablet (5 mg total) by mouth daily. (Patient taking differently: Take 5 mg by mouth daily after breakfast.), Disp: 90 tablet, Rfl: 0   amphetamine -dextroamphetamine  (ADDERALL  XR) 20 MG 24 hr capsule, TAKE ONE CAPSULE DAILY AT 9AM (VIAL), Disp: 30 capsule, Rfl: 0    atorvastatin  (LIPITOR) 20 MG tablet, Take 20 mg by mouth daily., Disp: , Rfl:    baclofen  (LIORESAL ) 10 MG tablet, TAKE ONE TABLET BY MOUTH FOUR TIMES DAILY @9AM -1PM-5PM-9PM, Disp: 360 tablet, Rfl: 3   busPIRone  (BUSPAR ) 15 MG tablet, TAKE 1 TABLET(15 MG) BY MOUTH TWICE DAILY, Disp: 180 tablet, Rfl: 0   citalopram  (CELEXA ) 20 MG tablet, Take 1 tablet (20 mg total) by mouth daily., Disp: 90 tablet, Rfl: 2   dalfampridine  10 MG TB12, TAKE 1 TABLET BY MOUTH TWICE DAILY, Disp: 60 tablet, Rfl: 0   diphenhydrAMINE  (BENADRYL ) 25 mg capsule, Take 25 mg by mouth every 6 (six) hours as needed for itching., Disp: , Rfl:    gabapentin  (NEURONTIN ) 800 MG tablet, 1 p.o. 4 times daily, Disp: 360 tablet, Rfl: 2   KESIMPTA  20 MG/0.4ML SOAJ, Inject 0.4 mLs into the skin every 30 (thirty) days. Starting at week 4, Disp: 0.4 mL, Rfl: 11   KESIMPTA  20 MG/0.4ML SOAJ, Inject 0.4ml  at week 0, 1, and 2, Disp: 1.2 mL, Rfl: 0   metoprolol  succinate (TOPROL -XL) 50 MG 24 hr tablet, Take 50 mg by mouth daily., Disp: , Rfl:    Multiple Vitamin (MULTIVITAMIN WITH MINERALS) TABS tablet, Take 1 tablet by mouth daily., Disp: 30 tablet, Rfl: 0   norethindrone  (MICRONOR ) 0.35 MG tablet, Take 1 tablet (0.35 mg total) by mouth daily., Disp: 1 Package, Rfl: 11   omeprazole (PRILOSEC) 40 MG capsule, Take 40 mg by mouth daily., Disp: , Rfl:    predniSONE  (DELTASONE ) 50 MG tablet, 12 pills (600 mg) po daily x 3 days for MS exacerbation, Disp: 36 tablet, Rfl: 0   pregabalin  (LYRICA ) 150 MG capsule, Take 1 capsule (150 mg total) by mouth 2 (two) times daily., Disp: 180 capsule, Rfl: 1   SUMAtriptan  (IMITREX ) 100 MG tablet, ORIG} TAKE ONE TABLET BY MOUTH ONE TIME FOR UP TO ONE DOSE AS NEEDED FOR MIGRAINE. MAY REPEAT IN 2 HOURS IF HEADACHE PRESISTS OR RECURS, Disp: 10 tablet, Rfl: 0   tamsulosin  (FLOMAX ) 0.4 MG CAPS capsule, Take 1 capsule (0.4 mg total) by mouth daily., Disp: 90 capsule, Rfl: 3   Teriflunomide  14 MG TABS, TAKE 1 TABLET BY MOUTH  DAILY, Disp: 30 tablet, Rfl: 4   tiZANidine  (ZANAFLEX ) 4 MG tablet, TAKE ONE TABLET BY MOUTH DAILY AT 9PM EVERY NIGHT AT BEDTIME, Disp: 30 tablet, Rfl: 4 No current facility-administered medications for this visit.  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   left lower extremity-- treated with eliquis    Gait disturbance    due to MS   GERD (gastroesophageal reflux disease)    watch diet   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/p  THA   History of DVT of lower extremity 2007   History of encephalopathy 04/2017   acute metabolic encephalopathy secondary to MS,  resolved   History of MRSA infection 2004   right hip infection post THA   History of pulmonary embolus (PE) 2005   Hydronephrosis, left    w/ acute kidney injury 02/ 2019   Hypertension    Left-sided weakness    due to MS   Migraines    MS (multiple sclerosis) [facility]) neurologist-  dr sherita   dx 2000--   Muscle spasticity    Neurogenic bladder    due to MS   Pulmonary embolism, bilateral (HCC) 09/17/2017   treated w/ eliquis    Strains to urinate    Urgency of urination     PAST SURGICAL HISTORY: Past Surgical History:  Procedure Laterality Date   CYSTOSCOPY WITH RETROGRADE PYELOGRAM, URETEROSCOPY AND STENT PLACEMENT Bilateral 11/29/2017   Procedure: BILATERAL  RETROGRADE PYELOGRAM, LEFT DIAGNOSIC URETEROSCOPY AND STENT LEFT PLACEMENT;  Surgeon: Cam Morene ORN, MD;   Location: [facility];  Service: Urology;  Laterality: Bilateral;   HIP ARTHROSCOPY Left 07-05-2013   dr pill @[facility]   iliopsosas release, synovectomy   REVISION TOTAL HIP ARTHROPLASTY Right 03-28-2003    dr ernie @[facility]    TOTAL HIP ARTHROPLASTY Bilateral left 11-05-2002  dr ernie @[facility] ;   right 06/ 2004  @[facility]     FAMILY HISTORY: Family History  Problem Relation Age of Onset   Healthy Mother    Healthy Father    Colon cancer Paternal Grandmother    Breast cancer Paternal Grandmother 70   Hypertension Other    Diabetes Other     PHYSICAL EXAM   Today's Vitals   10/09/23 1422  BP: 112/75  Pulse: 77  SpO2: 97%  Weight: 170 lb (77.1 kg)  Height: 5' 6 (1.676 m)   Body mass index is 27.44 kg/m.    General: The patient is well-developed and well-nourished and in no acute distress.   Neurologic Exam   Mental status: The patient is alert and oriented x 3 at the time of the examination. The patient has apparent normal recent and remote memory, with mildly reduced attention span and concentration ability.   Speech is normal.   Cranial nerves: Extraocular movements are full.  Facial strength and sensation is normal.  Trapezius strength is normal.. No obvious hearing deficits are noted.   Motor:  Muscle bulk is normal.  She has increased muscle tone in the left leg.  Strength is normal in the arms and 4+ to 5 in right leg and 2+/5 left hip flexion,  4-/5 in the distal left leg and 4+/5 left quads   Sensory: Touch and vibration sensation is normal in the arms and legs.   Coordination: She performs finger-nose-finger well.  She has reduced heel-to-shin, left worse than right.      Gait and station: She is able to take steps within the room without her cane though does better with the cane..  There is a left foot drop.  She cannot tandem walk.   Reflexes: Deep tendon reflexes are increased in the legs, left greater than right.  She has crossed abductor responses, worse on  the left.  She has nonsustained clonus in the left ankle      DIAGNOSTIC DATA (LABS, IMAGING, TESTING) - I reviewed patient records, labs, notes, testing and imaging myself where available.  Lab Results  Component Value Date   WBC 3.8 (L) 07/10/2023   HGB 13.7 07/10/2023   HCT 38.5 07/10/2023   MCV 91.4 07/10/2023   PLT 201 07/10/2023      Component Value Date/Time   NA 139 07/10/2023 1615   NA 144 04/10/2023 1520   K 3.4 (L) 07/10/2023 1615   CL 107 07/10/2023 1615   CO2 26 07/10/2023 1615   GLUCOSE 136 (H) 07/10/2023 1615   BUN 21 (H) 07/10/2023 1615   BUN 14 04/10/2023 1520   CREATININE 1.13 (H) 07/10/2023 1615   CREATININE 0.85 07/03/2014 0959   CALCIUM  9.0 07/10/2023 1615   PROT 6.3 (L) 07/10/2023 1615   PROT 6.7 04/10/2023 1520   ALBUMIN 4.1 07/10/2023 1615   ALBUMIN 4.0 04/10/2023 1520   AST 11 (L) 07/10/2023 1615   ALT 13 07/10/2023 1615   ALKPHOS 51 07/10/2023 1615   BILITOT 0.8 07/10/2023 1615   GFRNONAA >60 07/10/2023 1615   GFRNONAA 88 07/03/2014 0959   GFRAA 45 (L) 09/01/2019 0503   GFRAA >89 07/03/2014 0959   Lab Results  Component Value Date   CHOL 228 (H) 04/20/2017   HDL 77 04/20/2017   LDLCALC 142 (H) 04/20/2017   TRIG 46 04/20/2017  CHOLHDL 3.0 04/20/2017   Lab Results  Component Value Date   HGBA1C 5.3 04/20/2017   Lab Results  Component Value Date   VITAMINB12 368 07/10/2023   Lab Results  Component Value Date   TSH 2.140 04/19/2021      ASSESSMENT AND PLAN    1. Multiple sclerosis (HCC)   2. High risk medication use   3. Left-sided weakness   4. Other fatigue   5. Left foot drop   6. Gait disturbance   7. Stage 3 chronic kidney disease, unspecified whether stage 3a or 3b CKD (HCC)        1.   Continue Kesimpta    check labs.  We will recheck MRI of the brain and cervical spine to determine if she is having significant breakthrough activity 2.   Continue amantadine  3.   Continue baclofen  and tizanidine  4.    Continue seeing hematology for anemia 5.   Left AFO 6.   She will return to see me in 6 months or sooner if there are new or worsening neurologic symptoms.     This visit is part of a comprehensive longitudinal care medical relationship regarding the patients primary diagnosis of multiple sclerosis and related concerns.   Yachet Mattson A. Vear, MD, PhD 10/09/2023, 6:46 PM Certified in Neurology, Clinical Neurophysiology, Sleep Medicine, Pain Medicine and Neuroimaging  [facility] [address], [address] [place], [place] [address] [phone] ]

## 2023-10-10 ENCOUNTER — Telehealth: Payer: Self-pay | Admitting: Neurology

## 2023-10-10 NOTE — Telephone Encounter (Signed)
 no auth required sent to GI [phone]

## 2023-10-11 ENCOUNTER — Ambulatory Visit: Payer: Self-pay | Admitting: Neurology

## 2023-10-11 LAB — CBC WITH DIFFERENTIAL/PLATELET
Basophils Absolute: 0 x10E3/uL (ref 0.0–0.2)
Basos: 1 %
EOS (ABSOLUTE): 0.2 x10E3/uL (ref 0.0–0.4)
Eos: 4 %
Hematocrit: 45.3 % (ref 34.0–46.6)
Hemoglobin: 15 g/dL (ref 11.1–15.9)
Immature Grans (Abs): 0 x10E3/uL (ref 0.0–0.1)
Immature Granulocytes: 0 %
Lymphocytes Absolute: 2.7 x10E3/uL (ref 0.7–3.1)
Lymphs: 49 %
MCH: 32.3 pg (ref 26.6–33.0)
MCHC: 33.1 g/dL (ref 31.5–35.7)
MCV: 97 fL (ref 79–97)
Monocytes Absolute: 0.5 x10E3/uL (ref 0.1–0.9)
Monocytes: 10 %
Neutrophils Absolute: 1.9 x10E3/uL (ref 1.4–7.0)
Neutrophils: 36 %
Platelets: 224 x10E3/uL (ref 150–450)
RBC: 4.65 x10E6/uL (ref 3.77–5.28)
RDW: 13.7 % (ref 11.7–15.4)
WBC: 5.4 x10E3/uL (ref 3.4–10.8)

## 2023-10-11 LAB — CD20 B CELLS
% CD19-B Cells: 0 — AB (ref 4.6–22.1)
% CD20-B Cells: 0 — AB (ref 5.0–22.3)

## 2023-10-13 ENCOUNTER — Other Ambulatory Visit: Payer: Self-pay | Admitting: *Deleted

## 2023-10-13 DIAGNOSIS — I2699 Other pulmonary embolism without acute cor pulmonale: Secondary | ICD-10-CM

## 2023-10-15 NOTE — Assessment & Plan Note (Deleted)
 History of unprovoked events. Continue anticoagulation as tolerated.

## 2023-10-15 NOTE — Progress Notes (Deleted)
 [facility] OFFICE PROGRESS NOTE  Patient Care Team: [name] LABOR, MD as PCP - General (Neurology)  43 y.o.female with history of hypoproliferative anemia, CKD3, MS, GERD, LLE DVT and PE in 2019, multiple sclerosis, HTN, anxiety/depression referred to [facility] for history of recurrent DVT and PE.    History reviewed with patient. She had 2 episodes of blood clot appeared to be unprovoked. We discussed difference between provoked vs unprovoked settings.  First episode: PICC associated DVT on the RUE. Second episode: 2019 LLE DVT and PE Third episode: 2025 RLE DVT and PE Setting: unprovoked Treatment: Xarelto   Her thrombophilia was negative. For patients with unprovoked VTE, the risks of recurrent VTE after completing a course of anticoagulant therapy have been estimated to be 10% by 2 years and >30% by 10 years. ASH 2020.  Assessment & Plan   No orders of the defined types were placed in this encounter.    Dawn JAYSON Chihuahua, MD  INTERVAL HISTORY: Patient returns for follow-up.  Oncology History   No history exists.     PHYSICAL EXAMINATION: ECOG PERFORMANCE STATUS: {CHL ONC ECOG PS:[id number]}  There were no vitals filed for this visit. There were no vitals filed for this visit.  Relevant data reviewed during this visit included ***

## 2023-10-16 ENCOUNTER — Inpatient Hospital Stay

## 2023-10-16 DIAGNOSIS — Z86711 Personal history of pulmonary embolism: Secondary | ICD-10-CM

## 2023-10-21 ENCOUNTER — Emergency Department (HOSPITAL_COMMUNITY)
Admission: EM | Admit: 2023-10-21 | Discharge: 2023-10-23 | Disposition: A | Discharge status: Home / Self Care | Attending: Emergency Medicine | Admitting: Emergency Medicine

## 2023-10-21 ENCOUNTER — Encounter (HOSPITAL_COMMUNITY): Payer: Self-pay | Admitting: Emergency Medicine

## 2023-10-21 ENCOUNTER — Other Ambulatory Visit: Payer: Self-pay

## 2023-10-21 ENCOUNTER — Other Ambulatory Visit: Payer: Self-pay | Admitting: Neurology

## 2023-10-21 ENCOUNTER — Emergency Department (HOSPITAL_COMMUNITY)

## 2023-10-21 DIAGNOSIS — I129 Hypertensive chronic kidney disease with stage 1 through stage 4 chronic kidney disease, or unspecified chronic kidney disease: Secondary | ICD-10-CM | POA: Diagnosis not present

## 2023-10-21 DIAGNOSIS — N183 Chronic kidney disease, stage 3 unspecified: Secondary | ICD-10-CM | POA: Diagnosis not present

## 2023-10-21 DIAGNOSIS — Z7901 Long term (current) use of anticoagulants: Secondary | ICD-10-CM | POA: Diagnosis not present

## 2023-10-21 DIAGNOSIS — Z79899 Other long term (current) drug therapy: Secondary | ICD-10-CM | POA: Insufficient documentation

## 2023-10-21 DIAGNOSIS — N390 Urinary tract infection, site not specified: Secondary | ICD-10-CM

## 2023-10-21 DIAGNOSIS — Z96643 Presence of artificial hip joint, bilateral: Secondary | ICD-10-CM | POA: Insufficient documentation

## 2023-10-21 DIAGNOSIS — G35 Multiple sclerosis: Secondary | ICD-10-CM

## 2023-10-21 DIAGNOSIS — E876 Hypokalemia: Secondary | ICD-10-CM | POA: Diagnosis not present

## 2023-10-21 DIAGNOSIS — M549 Dorsalgia, unspecified: Secondary | ICD-10-CM | POA: Insufficient documentation

## 2023-10-21 DIAGNOSIS — J189 Pneumonia, unspecified organism: Secondary | ICD-10-CM

## 2023-10-21 DIAGNOSIS — G8929 Other chronic pain: Secondary | ICD-10-CM | POA: Insufficient documentation

## 2023-10-21 DIAGNOSIS — M5416 Radiculopathy, lumbar region: Secondary | ICD-10-CM | POA: Diagnosis not present

## 2023-10-21 DIAGNOSIS — R296 Repeated falls: Secondary | ICD-10-CM

## 2023-10-21 DIAGNOSIS — M79604 Pain in right leg: Secondary | ICD-10-CM | POA: Diagnosis not present

## 2023-10-21 DIAGNOSIS — R29898 Other symptoms and signs involving the musculoskeletal system: Secondary | ICD-10-CM

## 2023-10-21 DIAGNOSIS — R202 Paresthesia of skin: Secondary | ICD-10-CM

## 2023-10-21 LAB — CBC
HCT: 44.4 % (ref 36.0–46.0)
Hemoglobin: 14.6 g/dL (ref 12.0–15.0)
MCH: 31.6 pg (ref 26.0–34.0)
MCHC: 32.9 g/dL (ref 30.0–36.0)
MCV: 96.1 fL (ref 80.0–100.0)
Platelets: 254 K/uL (ref 150–400)
RBC: 4.62 MIL/uL (ref 3.87–5.11)
RDW: 13.5 % (ref 11.5–15.5)
WBC: 5.7 K/uL (ref 4.0–10.5)
nRBC: 0 % (ref 0.0–0.2)

## 2023-10-21 LAB — URINALYSIS, ROUTINE W REFLEX MICROSCOPIC
Bilirubin Urine: NEGATIVE
Glucose, UA: NEGATIVE mg/dL
Ketones, ur: NEGATIVE mg/dL
Nitrite: NEGATIVE
Protein, ur: NEGATIVE mg/dL
Specific Gravity, Urine: 1.018 (ref 1.005–1.030)
pH: 5 (ref 5.0–8.0)

## 2023-10-21 LAB — BASIC METABOLIC PANEL WITH GFR
Anion gap: 10 (ref 5–15)
BUN: 15 mg/dL (ref 6–20)
CO2: 28 mmol/L (ref 22–32)
Calcium: 9.3 mg/dL (ref 8.9–10.3)
Chloride: 104 mmol/L (ref 98–111)
Creatinine, Ser: 1.33 mg/dL — ABNORMAL HIGH (ref 0.44–1.00)
GFR, Estimated: 50 mL/min — ABNORMAL LOW (ref >60)
Glucose, Bld: 83 mg/dL (ref 70–99)
Potassium: 3.3 mmol/L — ABNORMAL LOW (ref 3.5–5.1)
Sodium: 142 mmol/L (ref 135–145)

## 2023-10-21 LAB — PREGNANCY, URINE: Preg Test, Ur: NEGATIVE

## 2023-10-21 MED ORDER — SODIUM CHLORIDE 0.9 % IV SOLN
1.0000 g | Freq: Once | INTRAVENOUS | Status: AC
  Administered 2023-10-21: 1 g via INTRAVENOUS
  Filled 2023-10-21: qty 10

## 2023-10-21 MED ORDER — DIAZEPAM 5 MG/ML IJ SOLN
2.5000 mg | Freq: Once | INTRAMUSCULAR | Status: AC | PRN
  Administered 2023-10-22: 2.5 mg via INTRAVENOUS
  Filled 2023-10-21: qty 2

## 2023-10-21 MED ORDER — DEXAMETHASONE SODIUM PHOSPHATE 10 MG/ML IJ SOLN
10.0000 mg | Freq: Once | INTRAMUSCULAR | Status: AC
  Administered 2023-10-21: 10 mg via INTRAVENOUS
  Filled 2023-10-21: qty 1

## 2023-10-21 MED ORDER — GADOBUTROL 1 MMOL/ML IV SOLN
8.0000 mL | Freq: Once | INTRAVENOUS | Status: AC | PRN
  Administered 2023-10-21: 8 mL via INTRAVENOUS

## 2023-10-21 MED ORDER — POTASSIUM CHLORIDE CRYS ER 20 MEQ PO TBCR
40.0000 meq | EXTENDED_RELEASE_TABLET | Freq: Once | ORAL | Status: AC
  Administered 2023-10-21: 40 meq via ORAL
  Filled 2023-10-21: qty 2

## 2023-10-21 MED ORDER — KETOROLAC TROMETHAMINE 30 MG/ML IJ SOLN
15.0000 mg | Freq: Once | INTRAMUSCULAR | Status: AC
  Administered 2023-10-21: 15 mg via INTRAVENOUS
  Filled 2023-10-21: qty 1

## 2023-10-21 NOTE — ED Triage Notes (Signed)
 Patient presents with back pain and right leg pain and weakness. The back pain reminds her of when she had this issues with blood clots. Symptoms began 2 days ago. She is also concerned she may have a urinary tract infection due to urinary frequency.

## 2023-10-21 NOTE — ED Provider Triage Note (Signed)
 Emergency Medicine Provider Triage Evaluation Note  Dawn Peters , a 43 y.o. female  was evaluated in triage.  Pt complains of back pain and limb weakness. Patient has a hx of MS. Hx of cheonic back p0ain. States that she is now having new R sided weakness and numbess x 2 days and pain is worse in her back No fever   Review of Systems  Positive: Back pain  Negative: fever  Physical Exam  Temp 99.1 F (37.3 C)  Gen:   Awake, no distress   Resp:  Normal effort  MSK:   Moves extremities without difficulty  Other:    Medical Decision Making  Medically screening exam initiated at 5:38 PM.  Appropriate orders placed.  Dawn Peters was informed that the remainder of the evaluation will be completed by another provider, this initial triage assessment does not replace that evaluation, and the importance of remaining in the ED until their evaluation is complete.     Arloa Chroman, PA-C 10/21/23 [phone]

## 2023-10-21 NOTE — ED Provider Notes (Addendum)
 [place] EMERGENCY DEPARTMENT AT [facility] Provider Note   CSN: [id number] Arrival date & time: 10/21/23  [id number]     Patient presents with: Back Pain   Dawn Peters is a 43 y.o. female with a past medical history of chronic back pain MS.  She presents emergency department today with chief complaint of back pain and leg weakness.  Patient reports that she has had chronic back pain, chronic leg weakness however she has had increasing back pain over the past several days and over the last 2 days has been having increased weakness in her legs with paresthesia and numbness in her right leg.  She states that today she fell 3 times because her legs keep giving out from underneath of her.  She denies any other injuries.  She usually uses a cane to ambulate.  History of blood clots and states that her back pain feels like when she had a blood clot.  She describes the pain is worse when she tries to stand, slightly improved with rest but still severe.  She is having trouble fully standing erect due to pain in her back.  She complains of pain in her entire back.  She also reports that she follows with [facility] for her chronic back pain.  She has been taking Tylenol  for pain.  She reports that one of her doctors recently gave her a few tablets of oxycodone  but it did not seem to help very much.  She has been having urinary frequency but denies saddle anesthesia bowel or bladder incontinence.    Back Pain      Prior to Admission medications   Medication Sig Start Date End Date Taking? Authorizing Provider  amantadine  (SYMMETREL ) 100 MG capsule TAKE 1 CAPSULE(100 MG) BY MOUTH TWICE DAILY 09/29/23   Sater, Charlie LABOR, MD  amitriptyline  (ELAVIL ) 25 MG tablet TAKE 1 TO 2 TABLETS (25 MG TO 50 MG) BY MOUTH AT BEDTIME 10/03/23   Sater, Charlie LABOR, MD  amLODipine  (NORVASC ) 5 MG tablet Take 1 tablet (5 mg total) by mouth daily. Patient taking differently: Take 5 mg by mouth daily after  breakfast. 05/21/17   Arrien, Elidia Sieving, MD  amphetamine -dextroamphetamine  (ADDERALL  XR) 20 MG 24 hr capsule TAKE ONE CAPSULE DAILY AT 9AM (VIAL) 10/03/23   Sater, Charlie LABOR, MD  atorvastatin  (LIPITOR) 20 MG tablet Take 20 mg by mouth daily. 04/19/23   [provider]  baclofen  (LIORESAL ) 10 MG tablet TAKE ONE TABLET BY MOUTH FOUR TIMES DAILY @9AM -1PM-5PM-9PM 07/03/23   Sater, Charlie LABOR, MD  busPIRone  (BUSPAR ) 15 MG tablet TAKE 1 TABLET(15 MG) BY MOUTH TWICE DAILY 08/23/23   Sater, Charlie LABOR, MD  citalopram  (CELEXA ) 20 MG tablet Take 1 tablet (20 mg total) by mouth daily. 07/05/23   Sater, Charlie LABOR, MD  dalfampridine  10 MG TB12 TAKE 1 TABLET BY MOUTH TWICE DAILY 09/13/23   Sater, Charlie LABOR, MD  diphenhydrAMINE  (BENADRYL ) 25 mg capsule Take 25 mg by mouth every 6 (six) hours as needed for itching.    [provider]  gabapentin  (NEURONTIN ) 800 MG tablet 1 p.o. 4 times daily 07/27/23   Buck Saucer, MD  KESIMPTA  20 MG/0.4ML SOAJ Inject 0.4 mLs into the skin every 30 (thirty) days. Starting at week 4 05/04/23   Sater, Charlie LABOR, MD  KESIMPTA  20 MG/0.4ML SOAJ Inject 0.6ml at week 0, 1, and 2 05/04/23   Sater, Charlie LABOR, MD  metoprolol  succinate (TOPROL -XL) 50 MG 24 hr tablet Take 50 mg  by mouth daily. 09/27/20   [provider]  Multiple Vitamin (MULTIVITAMIN WITH MINERALS) TABS tablet Take 1 tablet by mouth daily. 09/20/17   Shona Terry SAILOR, DO  norethindrone  (MICRONOR ) 0.35 MG tablet Take 1 tablet (0.35 mg total) by mouth daily. 11/19/18   Lola Donnice HERO, MD  omeprazole (PRILOSEC) 40 MG capsule Take 40 mg by mouth daily. 09/27/20   [provider]  predniSONE  (DELTASONE ) 50 MG tablet 12 pills (600 mg) po daily x 3 days for MS exacerbation 07/05/23   Sater, Charlie LABOR, MD  pregabalin  (LYRICA ) 150 MG capsule Take 1 capsule (150 mg total) by mouth 2 (two) times daily. 10/09/23   Sater, Charlie LABOR, MD  SUMAtriptan  (IMITREX ) 100 MG tablet ORIG} TAKE ONE TABLET BY MOUTH ONE TIME  FOR UP TO ONE DOSE AS NEEDED FOR MIGRAINE. MAY REPEAT IN 2 HOURS IF HEADACHE PRESISTS OR RECURS 01/30/23   Sater, Charlie LABOR, MD  tamsulosin  (FLOMAX ) 0.4 MG CAPS capsule Take 1 capsule (0.4 mg total) by mouth daily. 04/10/23   Sater, Charlie LABOR, MD  Teriflunomide  14 MG TABS TAKE 1 TABLET BY MOUTH DAILY 05/11/23   Sater, Charlie LABOR, MD  tiZANidine  (ZANAFLEX ) 4 MG tablet TAKE ONE TABLET BY MOUTH DAILY AT 9PM EVERY NIGHT AT BEDTIME 08/24/23   Sater, Charlie LABOR, MD    Allergies: Ace inhibitors, Amoxicillin, and Lisinopril     Review of Systems  Musculoskeletal:  Positive for back pain.    Updated Vital Signs BP (!) 130/90   Pulse 77   Temp 99.1 F (37.3 C)   Resp 20   SpO2 100%   Physical Exam Vitals and nursing note reviewed.  Constitutional:      General: She is not in acute distress.    Appearance: She is well-developed. She is not diaphoretic.  HENT:     Head: Normocephalic and atraumatic.     Right Ear: External ear normal.     Left Ear: External ear normal.     Nose: Nose normal.     Mouth/Throat:     Mouth: Mucous membranes are moist.  Eyes:     General: No scleral icterus.    Conjunctiva/sclera: Conjunctivae normal.  Cardiovascular:     Rate and Rhythm: Normal rate and regular rhythm.     Heart sounds: Normal heart sounds. No murmur heard.    No friction rub. No gallop.  Pulmonary:     Effort: Pulmonary effort is normal. No respiratory distress.     Breath sounds: Normal breath sounds.  Abdominal:     General: Bowel sounds are normal. There is no distension.     Palpations: Abdomen is soft. There is no mass.     Tenderness: There is no abdominal tenderness. There is no guarding.  Musculoskeletal:     Cervical back: Normal range of motion.     Comments: Range of motion limited due to pain in the paraspinal muscles of the entire back.  No midline spinal tenderness  Skin:    General: Skin is warm and dry.  Neurological:     Mental Status: She is alert and oriented to  person, place, and time.     Comments: Normal strength with dorsi and plantarflexion of the right leg, leg extension and flexion at the right knee and full strength and range of motion with abduction adduction extension and flexion of the hip.  Patient has minimal strength in the left leg secondary to her MS which limits the exam.  Psychiatric:  Behavior: Behavior normal.     (all labs ordered are listed, but only abnormal results are displayed) Labs Reviewed  BASIC METABOLIC PANEL WITH GFR - Abnormal; Notable for the following components:      Result Value   Potassium 3.3 (*)    Creatinine, Ser 1.33 (*)    GFR, Estimated 50 (*)    All other components within normal limits  CBC  URINALYSIS, ROUTINE W REFLEX MICROSCOPIC  PREGNANCY, URINE    EKG: None  Radiology: MR Cervical Spine W and Wo Contrast Result Date: 10/21/2023 CLINICAL DATA:  Multiple sclerosis. EXAM: MRI CERVICAL SPINE WITHOUT AND WITH CONTRAST TECHNIQUE: Multiplanar and multiecho pulse sequences of the cervical spine, to include the craniocervical junction and cervicothoracic junction, were obtained without and with intravenous contrast. CONTRAST:  8mL GADAVIST  GADOBUTROL  1 MMOL/ML IV SOLN COMPARISON:  Cervical spine MRI 11/21/2020 FINDINGS: Axial sequences are moderately motion degraded. Alignment: Normal. Vertebrae: No fracture, suspicious marrow lesion, or significant marrow edema. Cord: Similar appearance of mild diffuse hazy cord signal on sagittal T2 and STIR sequences without a definite discrete lesion on motion degraded axial images. No abnormal enhancement. Posterior Fossa, vertebral arteries, paraspinal tissues: Posterior fossa reported separately. Preserved vertebral artery flow voids. Disc levels: Preserved disc heights with no more than minor spondylosis. Widely patent spinal canal. No compressive neural foraminal stenosis. IMPRESSION: Stable appearance of the cervical spinal cord on this motion degraded study.  No evidence of active demyelination. Electronically Signed   By: Dasie Hamburg M.D.   On: 10/21/2023 19:59   MR Brain W and Wo Contrast Result Date: 10/21/2023 CLINICAL DATA:  Multiple sclerosis. EXAM: MRI HEAD WITHOUT AND WITH CONTRAST TECHNIQUE: Multiplanar, multiecho pulse sequences of the brain and surrounding structures were obtained without and with intravenous contrast. CONTRAST:  8mL GADAVIST  GADOBUTROL  1 MMOL/ML IV SOLN COMPARISON:  Head MRI 11/21/2020 FINDINGS: Brain: There is no evidence of an acute infarct, intracranial hemorrhage, mass, midline shift, or extra-axial fluid collection. Cerebral volume is unchanged. The ventricles are normal in size. Cerebral white matter T2 hyperintensities are unchanged and greatest in the periventricular white matter where there is confluent signal abnormality extending into the temporal lobes. Hazy T2 hyperintensity in the brainstem and cerebellum around the fourth ventricle is also unchanged. No abnormal enhancement is identified. Vascular: Major intracranial vascular flow voids are preserved. Skull and upper cervical spine: Unremarkable bone marrow signal. Sinuses/Orbits: Unremarkable orbits. Mild mucosal thickening in the left maxillary sinus. Clear mastoid air cells. Other: None. IMPRESSION: Unchanged white matter disease consistent with multiple sclerosis. No evidence of active demyelination. Electronically Signed   By: Dasie Hamburg M.D.   On: 10/21/2023 19:49     Procedures   Medications Ordered in the ED  diazepam  (VALIUM ) injection 2.5 mg (has no administration in time range)  ketorolac  (TORADOL ) 30 MG/ML injection 15 mg (has no administration in time range)  dexamethasone  (DECADRON ) injection 10 mg (has no administration in time range)  gadobutrol  (GADAVIST ) 1 MMOL/ML injection 8 mL (8 mLs Intravenous Contrast Given 10/21/23 1922)    Clinical Course as of 10/22/23 1051  Sat Oct 21, 2023  2234 Case discussed with Dr. Fidel- Agrees that  patient may need admission for Lumbar MRI- If there is a surgical emergency she would need Neurosurgical consult [AH]  2338 Case discussed with Dr. Vanessa.  He does state that he thinks the patient would benefit from a thoracic spine MRI with without and a lumbar spine MRI.  He also reports that the patient  could be having exacerbation of her symptoms or pseudo flare secondary to her urinary tract infection and may improve significantly from treatment of her underlying infection.  She will need a 12-hour washout from her original MRI contrast bolus which would put her around 630 a.m. tomorrow. If negative she will need OP emg with her neurology group [AH]  Sun Oct 22, 2023  0012 Case discussed with Dr. Alfornia who declines observation or admission stating that patient will need the MRIs done in the Emergency departtment  [AH]    Clinical Course User Index [AH] Arloa Chroman, PA-C                                 Medical Decision Making 43 year old female with a history of multiple sclerosis (MS), chronic back pain, and prior blood clots presents with several days of worsening back pain, new right leg paresthesia and numbness, bilateral leg weakness, and multiple falls. She ambulates with a cane at baseline. No reported trauma or other injuries. She describes the pain as similar to prior episodes of blood clot but denies other systemic symptoms.      Differential Diagnosis: includes but is not limited to MS relapse, compressive myelopathy/ radiculopathy Peripheral neuropathy, herniated lumbar disc, or Spinal stenosis Less likely- vascular spinal cord infarct, spinal epidural abscess , epidural hematoma, or  cauda equina         This patient's presentation of subacute worsening back pain, new right leg sensory changes, bilateral leg weakness, and falls in the context of MS and chronic back pain necessitates urgent evaluation for compressive spinal pathology, MS relapse, and other neurologic or  vascular etiologies. Emergent MRI and specialist consultation are warranted to guide further management and prevent permanent neurologic sequelae      Amount and/or Complexity of Data Reviewed Labs: ordered.    Details: Labs show urine with large leuks and bacteria appears to be positive for infection Otherwise mile hypokalemia- I have ordered oral repletion (K 3.3) Radiology: ordered and independent interpretation performed.    Details: MRI w/wo Brain/ Cspine negative for active or acute demyelinating lesions Discussion of management or test interpretation with external provider(s): As per ED course Sign out to PA Sponseller at shift change  Risk Prescription drug management. Parenteral controlled substances.        Final diagnoses:  None    ED Discharge Orders     None          Arloa Chroman, PA-C 10/22/23 1110    Arloa Chroman, PA-C 10/22/23 1138    Ruthe Cornet, DO 10/22/23 1553

## 2023-10-22 ENCOUNTER — Emergency Department (HOSPITAL_COMMUNITY)

## 2023-10-22 DIAGNOSIS — M5416 Radiculopathy, lumbar region: Secondary | ICD-10-CM | POA: Diagnosis not present

## 2023-10-22 DIAGNOSIS — M546 Pain in thoracic spine: Secondary | ICD-10-CM | POA: Diagnosis not present

## 2023-10-22 DIAGNOSIS — M549 Dorsalgia, unspecified: Secondary | ICD-10-CM | POA: Diagnosis not present

## 2023-10-22 DIAGNOSIS — Z7401 Bed confinement status: Secondary | ICD-10-CM | POA: Diagnosis not present

## 2023-10-22 MED ORDER — HYDROCORTISONE 1 % EX CREA
TOPICAL_CREAM | Freq: Two times a day (BID) | CUTANEOUS | Status: DC
  Administered 2023-10-23: 1 via TOPICAL
  Filled 2023-10-22: qty 28
  Filled 2023-10-22: qty 28.35

## 2023-10-22 MED ORDER — ATORVASTATIN CALCIUM 10 MG PO TABS
20.0000 mg | ORAL_TABLET | Freq: Every day | ORAL | Status: DC
  Administered 2023-10-22: 20 mg via ORAL
  Filled 2023-10-22: qty 2

## 2023-10-22 MED ORDER — ENOXAPARIN SODIUM 80 MG/0.8ML IJ SOSY
80.0000 mg | PREFILLED_SYRINGE | Freq: Two times a day (BID) | INTRAMUSCULAR | Status: DC
  Administered 2023-10-22: 80 mg via SUBCUTANEOUS
  Filled 2023-10-22 (×3): qty 0.8

## 2023-10-22 MED ORDER — GADOBUTROL 1 MMOL/ML IV SOLN
8.0000 mL | Freq: Once | INTRAVENOUS | Status: AC | PRN
  Administered 2023-10-22: 8 mL via INTRAVENOUS

## 2023-10-22 MED ORDER — MIDAZOLAM HCL 2 MG/2ML IJ SOLN
2.0000 mg | Freq: Once | INTRAMUSCULAR | Status: AC | PRN
  Administered 2023-10-22: 2 mg via INTRAVENOUS
  Filled 2023-10-22: qty 2

## 2023-10-22 MED ORDER — AMANTADINE HCL 100 MG PO CAPS
100.0000 mg | ORAL_CAPSULE | Freq: Two times a day (BID) | ORAL | Status: DC
  Administered 2023-10-22 (×3): 100 mg via ORAL
  Filled 2023-10-22 (×5): qty 1

## 2023-10-22 MED ORDER — BACLOFEN 10 MG PO TABS
10.0000 mg | ORAL_TABLET | Freq: Three times a day (TID) | ORAL | Status: DC | PRN
  Administered 2023-10-22: 10 mg via ORAL
  Filled 2023-10-22: qty 1

## 2023-10-22 MED ORDER — AMITRIPTYLINE HCL 25 MG PO TABS
50.0000 mg | ORAL_TABLET | Freq: Every day | ORAL | Status: DC
  Administered 2023-10-22 (×2): 50 mg via ORAL
  Filled 2023-10-22 (×2): qty 2

## 2023-10-22 MED ORDER — AMLODIPINE BESYLATE 5 MG PO TABS
5.0000 mg | ORAL_TABLET | Freq: Every day | ORAL | Status: DC
  Administered 2023-10-22: 5 mg via ORAL
  Filled 2023-10-22: qty 1

## 2023-10-22 MED ORDER — SODIUM CHLORIDE 0.9 % IV SOLN
1.0000 g | INTRAVENOUS | Status: DC
  Administered 2023-10-22: 1 g via INTRAVENOUS
  Filled 2023-10-22: qty 10

## 2023-10-22 MED ORDER — MORPHINE SULFATE (PF) 4 MG/ML IV SOLN
4.0000 mg | Freq: Once | INTRAVENOUS | Status: AC
  Administered 2023-10-22: 4 mg via INTRAVENOUS
  Filled 2023-10-22: qty 1

## 2023-10-22 MED ORDER — CITALOPRAM HYDROBROMIDE 10 MG PO TABS
20.0000 mg | ORAL_TABLET | Freq: Every day | ORAL | Status: DC
  Administered 2023-10-22: 20 mg via ORAL
  Filled 2023-10-22: qty 2

## 2023-10-22 MED ORDER — DALFAMPRIDINE ER 10 MG PO TB12: 10.0000 mg | ORAL_TABLET | Freq: Two times a day (BID) | ORAL | Status: DC

## 2023-10-22 MED ORDER — BUSPIRONE HCL 10 MG PO TABS
15.0000 mg | ORAL_TABLET | Freq: Two times a day (BID) | ORAL | Status: DC
  Administered 2023-10-22 (×2): 15 mg via ORAL
  Filled 2023-10-22 (×2): qty 2

## 2023-10-22 NOTE — ED Provider Notes (Signed)
 Patient transferred to [facility] emergency department due to her needing an MRI of her thoracic and lumbar spine after being recommended by neurology.  Patient recently had MRI with and without contrast of brain and cervical spine.  24-hour washout period is needed.  Due to MRI hours at [facility] long this is why the patient was transferred to [facility].  Patient set to have MRIs completed at approximately 7:30 PM tonight which is the earliest possible due to the 24-hour washout period being needed.  Discussed this with patient and patient understanding, patient overall well-appearing and currently denies any acute medical complaint in the emergency department.  Patient is having MRIs completed due to a newer onset of right sided weakness, patient has history of MS with a known left-sided deficit.  This patient was signed out to oncoming PA-C Lonni Camp who understands plan and that MRIs are going to be completed.  MRI brain and cervical spine showed no acute abnormality yesterday.   Dawn Terrall FALCON, PA-C 10/22/23 1639    Levander Houston, MD 10/24/23 [phone]

## 2023-10-22 NOTE — ED Provider Notes (Signed)
 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  Previously she had an LP done approximately a month ago.  She initially had an MRI of her brain and cervical spine without concerning finding however hospitalist requested for patient to have thoracic and lumbar spine MRI to ensure patient does not have any infectious etiology such as discitis/epidural abscess that may contribute to her symptoms.  Patient has been in the apartment for prolonged period of time to wait for the MRI as it requires a period of time for contrast washed out.  Subsequently MRI of thoracic and lumbar spine was obtained and have resulted and the result was independently viewed interpreted by me.  Fortunately no concerning new finding.  I will reach out to hospitalist to request admission.  Patient is aware of plan and agrees with plan.  I have consulted with [facility] DR. Franky who agrees to see pt and determine disposition.  Pt sign out to oncoming provider.   BP (!) 136/90   Pulse 81   Temp 98.8 F (37.1 C)   Resp 16   SpO2 100%   Results for orders placed or performed during the hospital encounter of 10/21/23  CBC   Collection Time: 10/21/23  6:30 PM  Result Value Ref Range   WBC 5.7 4.0 - 10.5 K/uL   RBC 4.62 3.87 - 5.11 MIL/uL   Hemoglobin 14.6 12.0 - 15.0 g/dL   HCT 55.5 63.9 - 53.9 %   MCV 96.1 80.0 - 100.0 fL   MCH 31.6 26.0 - 34.0 pg   MCHC 32.9 30.0 - 36.0 g/dL   RDW 86.4 88.4 - 84.4 %   Platelets 254 150 - 400 K/uL   nRBC 0.0 0.0 - 0.2 %  Basic metabolic panel   Collection Time: 10/21/23  6:30 PM  Result Value Ref Range   Sodium 142 135 - 145 mmol/L   Potassium 3.3 (L) 3.5 - 5.1 mmol/L    Chloride 104 98 - 111 mmol/L   CO2 28 22 - 32 mmol/L   Glucose, Bld 83 70 - 99 mg/dL   BUN 15 6 - 20 mg/dL   Creatinine, Ser 8.66 (H) 0.44 - 1.00 mg/dL   Calcium  9.3 8.9 - 10.3 mg/dL   GFR, Estimated 50 (L) >60 mL/min   Anion gap 10 5 - 15  Urinalysis, Routine w reflex microscopic -Urine, Clean Catch   Collection Time: 10/21/23  7:51 PM  Result Value Ref Range   Color, Urine YELLOW YELLOW   APPearance HAZY (A) CLEAR   Specific Gravity, Urine 1.018 1.005 - 1.030   pH 5.0 5.0 - 8.0   Glucose, UA NEGATIVE NEGATIVE mg/dL   Hgb urine dipstick SMALL (A) NEGATIVE   Bilirubin Urine NEGATIVE NEGATIVE   Ketones, ur NEGATIVE NEGATIVE mg/dL   Protein, ur NEGATIVE NEGATIVE mg/dL   Nitrite NEGATIVE NEGATIVE   Leukocytes,Ua LARGE (A) NEGATIVE   RBC / HPF 6-10 0 - 5 RBC/hpf   WBC, UA 0-5 0 - 5 WBC/hpf   Bacteria, UA RARE (A) NONE SEEN   Squamous Epithelial / HPF 21-50 0 - 5 /HPF   Mucus PRESENT   Pregnancy, urine   Collection Time: 10/21/23  7:51 PM  Result  Value Ref Range   Preg Test, Ur NEGATIVE NEGATIVE   *Note: Due to a large number of results and/or encounters for the requested time period, some results have not been displayed. A complete set of results can be found in Results Review.   MR THORACIC SPINE W WO CONTRAST Result Date: 10/22/2023 EXAM: MRI THORACIC AND LUMBAR SPINE WITH AND WITHOUT INTRAVENOUS CONTRAST 10/22/2023 10:35:08 PM TECHNIQUE: Multiplanar multisequence MRI of the thoracic and lumbar spine was performed with and without the administration of intravenous contrast. COMPARISON: 01/23/2021. Unchanged appearance of partially visualized left upper pole renal cyst. CLINICAL HISTORY: Trunk numbness or tingling. FINDINGS: BONES AND ALIGNMENT: Normal alignment. Normal vertebral body heights. Bone marrow signal is unremarkable. No abnormal enhancement. SPINAL CORD: Normal spinal cord size. Normal spinal cord signal. SOFT TISSUES: Unremarkable. THORACIC DISC LEVELS: No significant  disc herniation. No spinal canal stenosis or neural foraminal narrowing. LUMBAR DISC LEVELS: L1-L2: No significant disc herniation. No spinal canal stenosis or neural foraminal narrowing. L2-L3: No significant disc herniation. No spinal canal stenosis or neural foraminal narrowing. L3-L4: No significant disc herniation. No spinal canal stenosis or neural foraminal narrowing. L4-L5: No significant disc herniation. No spinal canal stenosis or neural foraminal narrowing. L5-S1: No significant disc herniation. No spinal canal stenosis or neural foraminal narrowing. IMPRESSION: 1. No acute findings. 2. No disc herniation, spinal canal stenosis or neural impingement in the thoracic or lumbar spine. Electronically signed by: Franky Stanford MD 10/22/2023 11:03 PM EDT RP Workstation: [id number]   MR Lumbar Spine W Wo Contrast Result Date: 10/22/2023 EXAM: MRI THORACIC AND LUMBAR SPINE WITH AND WITHOUT INTRAVENOUS CONTRAST 10/22/2023 10:35:08 PM TECHNIQUE: Multiplanar multisequence MRI of the thoracic and lumbar spine was performed with and without the administration of intravenous contrast. COMPARISON: 01/23/2021. Unchanged appearance of partially visualized left upper pole renal cyst. CLINICAL HISTORY: Trunk numbness or tingling. FINDINGS: BONES AND ALIGNMENT: Normal alignment. Normal vertebral body heights. Bone marrow signal is unremarkable. No abnormal enhancement. SPINAL CORD: Normal spinal cord size. Normal spinal cord signal. SOFT TISSUES: Unremarkable. THORACIC DISC LEVELS: No significant disc herniation. No spinal canal stenosis or neural foraminal narrowing. LUMBAR DISC LEVELS: L1-L2: No significant disc herniation. No spinal canal stenosis or neural foraminal narrowing. L2-L3: No significant disc herniation. No spinal canal stenosis or neural foraminal narrowing. L3-L4: No significant disc herniation. No spinal canal stenosis or neural foraminal narrowing. L4-L5: No significant disc herniation. No spinal canal  stenosis or neural foraminal narrowing. L5-S1: No significant disc herniation. No spinal canal stenosis or neural foraminal narrowing. IMPRESSION: 1. No acute findings. 2. No disc herniation, spinal canal stenosis or neural impingement in the thoracic or lumbar spine. Electronically signed by: Franky Stanford MD 10/22/2023 11:03 PM EDT RP Workstation: [id number]   MR Cervical Spine W and Wo Contrast Result Date: 10/21/2023 CLINICAL DATA:  Multiple sclerosis. EXAM: MRI CERVICAL SPINE WITHOUT AND WITH CONTRAST TECHNIQUE: Multiplanar and multiecho pulse sequences of the cervical spine, to include the craniocervical junction and cervicothoracic junction, were obtained without and with intravenous contrast. CONTRAST:  8mL GADAVIST  GADOBUTROL  1 MMOL/ML IV SOLN COMPARISON:  Cervical spine MRI 11/21/2020 FINDINGS: Axial sequences are moderately motion degraded. Alignment: Normal. Vertebrae: No fracture, suspicious marrow lesion, or significant marrow edema. Cord: Similar appearance of mild diffuse hazy cord signal on sagittal T2 and STIR sequences without a definite discrete lesion on motion degraded axial images. No abnormal enhancement. Posterior Fossa, vertebral arteries, paraspinal tissues: Posterior fossa reported separately. Preserved vertebral artery flow voids. Disc levels: Preserved disc heights  with no more than minor spondylosis. Widely patent spinal canal. No compressive neural foraminal stenosis. IMPRESSION: Stable appearance of the cervical spinal cord on this motion degraded study. No evidence of active demyelination. Electronically Signed   By: Dasie Hamburg M.D.   On: 10/21/2023 19:59   MR Brain W and Wo Contrast Result Date: 10/21/2023 CLINICAL DATA:  Multiple sclerosis. EXAM: MRI HEAD WITHOUT AND WITH CONTRAST TECHNIQUE: Multiplanar, multiecho pulse sequences of the brain and surrounding structures were obtained without and with intravenous contrast. CONTRAST:  8mL GADAVIST  GADOBUTROL  1 MMOL/ML IV SOLN  COMPARISON:  Head MRI 11/21/2020 FINDINGS: Brain: There is no evidence of an acute infarct, intracranial hemorrhage, mass, midline shift, or extra-axial fluid collection. Cerebral volume is unchanged. The ventricles are normal in size. Cerebral white matter T2 hyperintensities are unchanged and greatest in the periventricular white matter where there is confluent signal abnormality extending into the temporal lobes. Hazy T2 hyperintensity in the brainstem and cerebellum around the fourth ventricle is also unchanged. No abnormal enhancement is identified. Vascular: Major intracranial vascular flow voids are preserved. Skull and upper cervical spine: Unremarkable bone marrow signal. Sinuses/Orbits: Unremarkable orbits. Mild mucosal thickening in the left maxillary sinus. Clear mastoid air cells. Other: None. IMPRESSION: Unchanged white matter disease consistent with multiple sclerosis. No evidence of active demyelination. Electronically Signed   By: Dasie Hamburg M.D.   On: 10/21/2023 19:49      Nivia Colon, PA-C 10/22/23 2325    Freddi Hamilton, MD 10/25/23 [phone]

## 2023-10-22 NOTE — ED Provider Notes (Signed)
  Physical Exam  BP (!) 133/101   Pulse (!) 101   Temp 98.5 F (36.9 C) (Oral)   Resp 14   SpO2 100%   Physical Exam  Procedures  Procedures  ED Course / MDM   Clinical Course as of 10/22/23 0043  Sat Oct 21, 2023  2234 Case discussed with Dr. Fidel- Agrees that patient may need admission for Lumbar MRI- If there is a surgical emergency she would need Neurosurgical consult [AH]  2338 Case discussed with Dr. Vanessa.  He does state that he thinks the patient would benefit from a thoracic spine MRI with without and a lumbar spine MRI.  He also reports that the patient could be having exacerbation of her symptoms or pseudo flare secondary to her urinary tract infection and may improve significantly from treatment of her underlying infection.  She will need a 12-hour washout from her original MRI contrast bolus which would put her around 630 a.m. tomorrow. If negative she will need OP emg with her neurology group [AH]  Sun Oct 22, 2023  0012 Case discussed with Dr. Alfornia who declines observation or admission stating that patient will need the MRIs done in the Emergency departtment  [AH]    Clinical Course User Index [AH] Dawn Chroman, PA-C   Medical Decision Making Amount and/or Complexity of Data Reviewed Labs: ordered. Radiology: ordered.  Risk Prescription drug management.    Care of this patient assumed from preceding ED provider Peters Arloa, PA-C at time of shift change.  Please see her associated note for further insight with patient's ED course.  In brief patient is a 43 year old female with history of MS who presents with no right lower extremity paresthesias and worsening bilateral lower extremity weakness, diffuse back pain.  Recommendations of neurologist in consult with preceding ED provider, patient will require MRIs of the T and L-spine's with and without contrast; it is possible that her symptoms are secondary to pseudo flare incited by her urinary tract  infection, however need to rule out acute MS flare.  Patient had MRI brain and C-spine with and without contrast earlier in the evening therefore required 12-hour washout period.  Anticipate imaging around 7 AM.  Will board in the ED until that time as hospital medicine has refused to admit the patient pending MRIs.   Patient remained hemodynamically stable throughout her stay in the emergency department overnight.Care of this patient signed out to oncoming ED provider Dawn Eck, PA-C at time of shift change. All pertinent HPI, physical exam, and laboratory findings were discussed with them prior to my departure. Disposition of patient pending completion of workup, reevaluation, and clinical judgement of oncoming ED provider.  Pending MRI at time of shift change.  This chart was dictated using voice recognition software, Dragon. Despite the best efforts of this provider to proofread and correct errors, errors may still occur which can change documentation meaning.    Dawn Peters 10/22/23 9373    Trine Raynell Moder, MD 10/22/23 [phone]

## 2023-10-22 NOTE — Consult Note (Signed)
 [facility] Initial Consultation Note  Dawn Peters FMW:[id number] DOB: 20-May-1980 DOA: 10/21/2023  PCP: [name] LABOR, MD   Requesting Physician: Dr. Randol   Reason for Consultation: Admission  HPI: Dawn Peters is a 43 y.o. female with medical history significant for MS with chronic left lower extremity weakness, DVT and PE despite Eliquis  currently on Xarelto , hypertension, CKD stage III being seen in consultation by the hospitalist service for consideration of admission.  Patient presented on the afternoon of 8/16 with complaints of thoracic and lumbar back pain radiating into her right leg.  She tells me that she intermittently has had thoracic back pain in the past, in fact the pain she is having now is reminiscent of when she was diagnosed with PE in the past.  However she has new pain in her lower back which is radiating down into her right leg.  Typically she feels she has pretty normal strength in the right lower extremity, the left side is weak at baseline.  Now she feels like she has weakness in her right leg which is new in the last 2 or 3 days.  In addition to this, she tells me that overnight she has developed some tingling shooting down the right lower extremity as well.  She denies any fevers or chills, any cough or shortness of breath however she also has been urinating frequently and wondered whether she had a UTI.  In the emergency department, lab work as detailed below shows MRI of the brain as well as a thoracic MRI without acute findings.  There is evidence of possible UTI, she was started on empiric IV Rocephin .  Overnight, ER providers discussed with orthopedic surgery Dr. Fidel as well as neurology Dr. Vanessa both of whom recommended MRI of the thoracic and lumbar spine with and without contrast.  ER provider consulted hospitalist service overnight, who declined admission and recommended that thoracic and lumbar spine MRI needs to be completed in  the ER in order to determine safe disposition.  They are now requesting consideration for hospitalist admission, as MRI likely cannot occur until the morning of 8/18.  Review of Systems: Please see HPI for pertinent positives and negatives. A complete 10 system review of systems are otherwise negative.  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   left lower extremity-- treated with eliquis    Gait disturbance    due to MS   GERD (gastroesophageal reflux disease)    watch diet   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/p  THA   History of DVT of lower extremity 2007   History of encephalopathy 04/2017   acute metabolic encephalopathy secondary to MS,  resolved   History of MRSA infection 2004   right hip infection post THA   History of pulmonary embolus (PE) 2005   Hydronephrosis, left    w/ acute kidney injury 02/ 2019   Hypertension    Left-sided weakness    due to MS   Migraines    MS (multiple sclerosis) [facility]) neurologist-  dr sherita   dx 2000--   Muscle spasticity    Neurogenic bladder    due to MS   Pulmonary embolism, bilateral (HCC) 09/17/2017  treated w/ eliquis    Strains to urinate    Urgency of urination    Past Surgical History:  Procedure Laterality Date   CYSTOSCOPY WITH RETROGRADE PYELOGRAM, URETEROSCOPY AND STENT PLACEMENT Bilateral 11/29/2017   Procedure: BILATERAL  RETROGRADE PYELOGRAM, LEFT DIAGNOSIC URETEROSCOPY AND STENT LEFT PLACEMENT;  Surgeon: Cam Morene ORN, MD;  Location: [facility];  Service: Urology;  Laterality: Bilateral;   HIP ARTHROSCOPY Left 07-05-2013   dr pill @[facility]    iliopsosas release, synovectomy   REVISION TOTAL HIP ARTHROPLASTY Right 03-28-2003    dr ernie @[facility]    TOTAL HIP  ARTHROPLASTY Bilateral left 11-05-2002  dr ernie @[facility] ;   right 06/ 2004  @[facility]     Social History:  reports that she has never smoked. She has never used smokeless tobacco. She reports that she does not drink alcohol and does not use drugs.  Allergies  Allergen Reactions   Ace Inhibitors Swelling   Amoxicillin Itching    Did it involve swelling of the face/tongue/throat, SOB, or low BP? Yes Did it involve sudden or severe rash/hives, skin peeling, or any reaction on the inside of your mouth or nose? No Did you need to seek medical attention at a hospital or doctor's office? No When did it last happen?      unknown  If all above answers are "NO", may proceed with cephalosporin use.;   Lisinopril  Swelling    Lower lip swelling    Family History  Problem Relation Age of Onset   Healthy Mother    Healthy Father    Colon cancer Paternal Grandmother    Breast cancer Paternal Grandmother 74   Hypertension Other    Diabetes Other      Prior to Admission medications   Medication Sig Start Date End Date Taking? Authorizing Provider  amantadine  (SYMMETREL ) 100 MG capsule TAKE 1 CAPSULE(100 MG) BY MOUTH TWICE DAILY 09/29/23  Yes Sater, Charlie LABOR, MD  amitriptyline  (ELAVIL ) 25 MG tablet TAKE 1 TO 2 TABLETS (25 MG TO 50 MG) BY MOUTH AT BEDTIME Patient taking differently: Take 50 mg by mouth at bedtime. 10/03/23  Yes Sater, Charlie LABOR, MD  amLODipine  (NORVASC ) 5 MG tablet Take 1 tablet (5 mg total) by mouth daily. Patient taking differently: Take 5 mg by mouth daily after breakfast. 05/21/17  Yes Arrien, Elidia Sieving, MD  amphetamine -dextroamphetamine  (ADDERALL  XR) 20 MG 24 hr capsule TAKE ONE CAPSULE DAILY AT 9AM (VIAL) 10/03/23  Yes Sater, Charlie LABOR, MD  atorvastatin  (LIPITOR) 20 MG tablet Take 20 mg by mouth daily. 04/19/23  Yes [provider]  baclofen  (LIORESAL ) 10 MG tablet TAKE ONE TABLET BY MOUTH FOUR TIMES DAILY @9AM -1PM-5PM-9PM 07/03/23  Yes Sater, Charlie LABOR, MD  busPIRone   (BUSPAR ) 15 MG tablet TAKE 1 TABLET(15 MG) BY MOUTH TWICE DAILY 08/23/23  Yes Sater, Charlie LABOR, MD  citalopram  (CELEXA ) 20 MG tablet Take 1 tablet (20 mg total) by mouth daily. 07/05/23  Yes Sater, Charlie LABOR, MD  dalfampridine  10 MG TB12 TAKE 1 TABLET BY MOUTH TWICE DAILY 09/13/23  Yes Sater, Charlie LABOR, MD  diphenhydrAMINE  (BENADRYL ) 25 mg capsule Take 25 mg by mouth at bedtime.   Yes [provider]  KESIMPTA  20 MG/0.4ML SOAJ Inject 0.4 mLs into the skin every 30 (thirty) days. Starting at week 4 05/04/23  Yes Sater, Charlie LABOR, MD  metoprolol  succinate (TOPROL -XL) 50 MG 24 hr tablet Take 50 mg by mouth daily. 09/27/20  Yes [provider]  Multiple Vitamin (MULTIVITAMIN WITH MINERALS) TABS  tablet Take 1 tablet by mouth daily. 09/20/17  Yes Shona Terry SAILOR, DO  norethindrone  (MICRONOR ) 0.35 MG tablet Take 1 tablet (0.35 mg total) by mouth daily. 11/19/18  Yes Lola Donnice HERO, MD  omeprazole (PRILOSEC) 40 MG capsule Take 40 mg by mouth daily. 09/27/20  Yes [provider]  oxyCODONE  (OXY IR/ROXICODONE ) 5 MG immediate release tablet Take 5 mg by mouth 2 (two) times daily as needed for severe pain (pain score 7-10).   Yes [provider]  pregabalin  (LYRICA ) 150 MG capsule Take 1 capsule (150 mg total) by mouth 2 (two) times daily. 10/09/23  Yes Sater, Charlie LABOR, MD  rivaroxaban  (XARELTO ) 20 MG TABS tablet Take 20 mg by mouth in the morning.   Yes [provider]  SUMAtriptan  (IMITREX ) 100 MG tablet ORIG} TAKE ONE TABLET BY MOUTH ONE TIME FOR UP TO ONE DOSE AS NEEDED FOR MIGRAINE. MAY REPEAT IN 2 HOURS IF HEADACHE PRESISTS OR RECURS 01/30/23  Yes Sater, Charlie LABOR, MD  tiZANidine  (ZANAFLEX ) 4 MG tablet TAKE ONE TABLET BY MOUTH DAILY AT 9PM EVERY NIGHT AT BEDTIME 08/24/23  Yes Sater, Charlie LABOR, MD  gabapentin  (NEURONTIN ) 800 MG tablet 1 p.o. 4 times daily Patient not taking: Reported on 10/22/2023 07/27/23   Buck Saucer, MD  KESIMPTA  20 MG/0.4ML SOAJ Inject 0.72ml at week  0, 1, and 2 Patient not taking: Reported on 10/22/2023 05/04/23   [name] LABOR, MD  predniSONE  (DELTASONE ) 50 MG tablet 12 pills (600 mg) po daily x 3 days for MS exacerbation Patient not taking: Reported on 10/22/2023 07/05/23   Sater, Charlie LABOR, MD  tamsulosin  (FLOMAX ) 0.4 MG CAPS capsule Take 1 capsule (0.4 mg total) by mouth daily. Patient not taking: Reported on 10/22/2023 04/10/23   [name] LABOR, MD  Teriflunomide  14 MG TABS TAKE 1 TABLET BY MOUTH DAILY Patient not taking: Reported on 10/22/2023 05/11/23   [name] LABOR, MD    Physical Exam: BP (!) 140/99 (BP Location: Right Arm)   Pulse 84   Temp 98.2 F (36.8 C)   Resp 14   SpO2 97%   General:  Alert, oriented, calm, in no acute distress, pleasant and cooperative.  Looks nontoxic and quite comfortable. Cardiovascular: RRR, no murmurs or rubs, no peripheral edema  Respiratory: clear to auscultation bilaterally, no wheezes, no crackles  Abdomen: soft, nontender, nondistended Skin: dry, no rashes  Musculoskeletal: no joint effusions, she has some point tenderness over the posterior thoracic spine, however no tenderness over the lumbar spine. Psychiatric: appropriate affect, normal speech  Neurologic: extraocular muscles intact, clear speech, she has significant weakness in the left side with left leg raise, right leg raise is also quite weak which patient states is not her baseline.  She also has radicular pain on the right with trying to raise that extremity.  She has normal plantarflexion strength bilaterally.         Recent Labs and Imaging Reviewed:  Basic Metabolic Panel: Recent Labs  Lab 10/21/23 1830  NA 142  K 3.3*  CL 104  CO2 28  GLUCOSE 83  BUN 15  CREATININE 1.33*  CALCIUM  9.3   Liver Function Tests: No results for input(s): AST, ALT, ALKPHOS, BILITOT, PROT, ALBUMIN in the last 168 hours. No results for input(s): LIPASE, AMYLASE in the last 168 hours. No results for input(s): AMMONIA in  the last 168 hours. CBC: Recent Labs  Lab 10/21/23 1830  WBC 5.7  HGB 14.6  HCT 44.4  MCV 96.1  PLT 254   Cardiac Enzymes: No results for input(s): CKTOTAL, CKMB, CKMBINDEX, TROPONINI in the last 168 hours.  BNP (last 3 results) No results for input(s): BNP in the last 8760 hours.  ProBNP (last 3 results) No results for input(s): PROBNP in the last 8760 hours.  CBG: No results for input(s): GLUCAP in the last 168 hours.  Radiological Exams on Admission: MR Cervical Spine W and Wo Contrast Result Date: 10/21/2023 CLINICAL DATA:  Multiple sclerosis. EXAM: MRI CERVICAL SPINE WITHOUT AND WITH CONTRAST TECHNIQUE: Multiplanar and multiecho pulse sequences of the cervical spine, to include the craniocervical junction and cervicothoracic junction, were obtained without and with intravenous contrast. CONTRAST:  8mL GADAVIST  GADOBUTROL  1 MMOL/ML IV SOLN COMPARISON:  Cervical spine MRI 11/21/2020 FINDINGS: Axial sequences are moderately motion degraded. Alignment: Normal. Vertebrae: No fracture, suspicious marrow lesion, or significant marrow edema. Cord: Similar appearance of mild diffuse hazy cord signal on sagittal T2 and STIR sequences without a definite discrete lesion on motion degraded axial images. No abnormal enhancement. Posterior Fossa, vertebral arteries, paraspinal tissues: Posterior fossa reported separately. Preserved vertebral artery flow voids. Disc levels: Preserved disc heights with no more than minor spondylosis. Widely patent spinal canal. No compressive neural foraminal stenosis. IMPRESSION: Stable appearance of the cervical spinal cord on this motion degraded study. No evidence of active demyelination. Electronically Signed   By: Dasie Hamburg M.D.   On: 10/21/2023 19:59   MR Brain W and Wo Contrast Result Date: 10/21/2023 CLINICAL DATA:  Multiple sclerosis. EXAM: MRI HEAD WITHOUT AND WITH CONTRAST TECHNIQUE: Multiplanar, multiecho pulse sequences of the brain and  surrounding structures were obtained without and with intravenous contrast. CONTRAST:  8mL GADAVIST  GADOBUTROL  1 MMOL/ML IV SOLN COMPARISON:  Head MRI 11/21/2020 FINDINGS: Brain: There is no evidence of an acute infarct, intracranial hemorrhage, mass, midline shift, or extra-axial fluid collection. Cerebral volume is unchanged. The ventricles are normal in size. Cerebral white matter T2 hyperintensities are unchanged and greatest in the periventricular white matter where there is confluent signal abnormality extending into the temporal lobes. Hazy T2 hyperintensity in the brainstem and cerebellum around the fourth ventricle is also unchanged. No abnormal enhancement is identified. Vascular: Major intracranial vascular flow voids are preserved. Skull and upper cervical spine: Unremarkable bone marrow signal. Sinuses/Orbits: Unremarkable orbits. Mild mucosal thickening in the left maxillary sinus. Clear mastoid air cells. Other: None. IMPRESSION: Unchanged white matter disease consistent with multiple sclerosis. No evidence of active demyelination. Electronically Signed   By: Dasie Hamburg M.D.   On: 10/21/2023 19:49    Summary and Recommendations: Dawn Peters is a 43 y.o. female with medical history significant for MS with chronic left lower extremity weakness, DVT and PE despite Eliquis  currently on Xarelto , hypertension, CKD stage III being seen in consultation by the hospitalist service for evaluation and management of new onset lumbar radiculopathy.  New onset right lower extremity weakness with radicular pain-this is concerning for possible lumbar discitis/abscess especially in the setting of suspected UTI.  Notably, patient also had LP done as an outpatient probably about 1 month ago. -Follow-up MR thoracic and lumbar spine with and without contrast -Patient may need urgent neurosurgical evaluation pending findings -Will obtain peripheral blood culture x 2  Suspected UTI-continue empiric IV  Rocephin , follow-up on urine culture obtained 8/16  History of DVT and multiple PE-even in the setting of Eliquis , patient is now on Xarelto .  Last dose of Xarelto  was 8/16 AM. -Continue to hold Xarelto  until we are sure that  she will not require any surgical intervention -Pharmacy consult for Lovenox  bridge in the meantime  Multiple sclerosis-continue home amantadine , baclofen , dalfampridine   Depression and anxiety-continue home BuSpar , Celexa   Hypertension-Norvasc   Thank you for involving us  in the care of your patient. [facility] will continue to follow along with you and we will be happy to admit the patient once safe disposition is determined.  Time spent: 55 minutes  Hildur Bayer CHRISTELLA Gail MD [facility] Pager [phone]  If 7PM-7AM, please contact night-coverage [email or web address] Password [email or web address]  10/22/2023, 10:07 AM

## 2023-10-22 NOTE — ED Provider Notes (Signed)
 Accepted handoff at shift change from Rebekah Sponseller PA-C. Please see prior provider note for more detail.   Briefly: Patient is 43 y.o. past medical history significant for MS presents today with back pain and right leg pain and weakness.  Patient has left leg weakness at baseline.  Patient had MRI brain and C-spine with and without.  Neurology would like further imaging of the spine, waiting for washout.  Patient also concern for UTI.  DDX: concern for MS flare, pseudo flare, UTI  Plan: Washout, MRI thoracic and lumbar spine  Physical Exam  BP (!) 141/76 (BP Location: Right Arm)   Pulse 76   Temp 98.1 F (36.7 C) (Oral)   Resp 16   SpO2 99%   Physical Exam  Procedures  Procedures  ED Course / MDM   Clinical Course as of 10/22/23 1128  Sat Oct 21, 2023  2234 Case discussed with Dr. Fidel- Agrees that patient may need admission for Lumbar MRI- If there is a surgical emergency she would need Neurosurgical consult [AH]  2338 Case discussed with Dr. Vanessa.  He does state that he thinks the patient would benefit from a thoracic spine MRI with without and a lumbar spine MRI.  He also reports that the patient could be having exacerbation of her symptoms or pseudo flare secondary to her urinary tract infection and may improve significantly from treatment of her underlying infection.  She will need a 12-hour washout from her original MRI contrast bolus which would put her around 630 a.m. tomorrow. If negative she will need OP emg with her neurology group [AH]  Sun Oct 22, 2023  0012 Case discussed with Dr. Alfornia who declines observation or admission stating that patient will need the MRIs done in the Emergency departtment  [AH]    Clinical Course User Index [AH] Arloa Chroman, PA-C   Medical Decision Making Amount and/or Complexity of Data Reviewed Labs: ordered. Radiology: ordered.  Risk Prescription drug management.   Imaging: MRI thoracic and lumbar w/wo:    Consulted hospitalist, Dr. Zella consulted on the patient recommends patient stays in ER until all MRIs are completed and a safe disposition can be determined.  Patient to be transferred ED to ED with Dr. Laurice being accepting physician at [facility] as they have MRIs 24/7 and patient would have to wait an additional 11 hours until we have MRI capability at 6 AM Monday morning.     Francis Ileana SAILOR, PA-C 10/22/23 1128    Randol Simmonds, MD 10/23/23 [phone]

## 2023-10-22 NOTE — ED Notes (Signed)
 Pt transported to MRI pt is concerned about anxiety in MRI.

## 2023-10-22 NOTE — ED Notes (Signed)
 Lunch tray ordered

## 2023-10-22 NOTE — ED Notes (Addendum)
 Report given to ED charge [facility]. [facility] called.

## 2023-10-23 ENCOUNTER — Emergency Department (HOSPITAL_COMMUNITY)

## 2023-10-23 DIAGNOSIS — R202 Paresthesia of skin: Secondary | ICD-10-CM

## 2023-10-23 DIAGNOSIS — M549 Dorsalgia, unspecified: Secondary | ICD-10-CM | POA: Diagnosis not present

## 2023-10-23 LAB — BASIC METABOLIC PANEL WITH GFR
Anion gap: 4 — ABNORMAL LOW (ref 5–15)
BUN: 22 mg/dL — ABNORMAL HIGH (ref 6–20)
CO2: 26 mmol/L (ref 22–32)
Calcium: 8.6 mg/dL — ABNORMAL LOW (ref 8.9–10.3)
Chloride: 108 mmol/L (ref 98–111)
Creatinine, Ser: 1.42 mg/dL — ABNORMAL HIGH (ref 0.44–1.00)
GFR, Estimated: 46 mL/min — ABNORMAL LOW (ref >60)
Glucose, Bld: 111 mg/dL — ABNORMAL HIGH (ref 70–99)
Potassium: 3.8 mmol/L (ref 3.5–5.1)
Sodium: 138 mmol/L (ref 135–145)

## 2023-10-23 LAB — URINE CULTURE

## 2023-10-23 LAB — CBG MONITORING, ED: Glucose-Capillary: 139 mg/dL — ABNORMAL HIGH (ref 70–99)

## 2023-10-23 MED ORDER — POTASSIUM CHLORIDE 20 MEQ PO PACK
20.0000 meq | PACK | Freq: Once | ORAL | Status: AC
  Administered 2023-10-23: 20 meq via ORAL
  Filled 2023-10-23: qty 1

## 2023-10-23 MED ORDER — IOHEXOL 350 MG/ML SOLN
75.0000 mL | Freq: Once | INTRAVENOUS | Status: AC | PRN
  Administered 2023-10-23: 75 mL via INTRAVENOUS

## 2023-10-23 MED ORDER — DOXYCYCLINE HYCLATE 100 MG PO CAPS: 100.0000 mg | ORAL_CAPSULE | Freq: Two times a day (BID) | ORAL | 0 refills | Status: DC

## 2023-10-23 MED ORDER — CEFPODOXIME PROXETIL 200 MG PO TABS: 200.0000 mg | ORAL_TABLET | Freq: Two times a day (BID) | ORAL | 0 refills | Status: AC

## 2023-10-23 MED ORDER — DOXYCYCLINE HYCLATE 100 MG PO TABS
100.0000 mg | ORAL_TABLET | Freq: Once | ORAL | Status: AC
  Administered 2023-10-23: 100 mg via ORAL
  Filled 2023-10-23: qty 1

## 2023-10-23 NOTE — Care Management (Signed)
 Transition of Care [facility]) - Emergency Department Mini Assessment   Patient Details  Name: Dawn Peters MRN: [id number] Date of Birth: Nov 18, 1980  [facility]) CM/SW Contact:    Corean JAYSON Canary, RN Phone Number: 10/23/2023, 8:50 AM   Clinical Narrative:  Patient presented to ED with right leg weakness and back pain.  Called patient to discuss discharge planning. She would like a rolator and would like OP PT restarted with [facility].  Order faxed to [facility] [phone] Rolator will be ordered via [employer]  ED Mini Assessment:    Barriers to Discharge: No Barriers Identified        Interventions which prevented an admission or readmission: DME Provided    Patient Contact and Communications        ,                 Admission diagnosis:  limbs are weak, can't stand up, pain in back Patient Active Problem List   Diagnosis Date Noted   History of pulmonary embolism 04/20/2023   Generalized weakness 04/19/2021   GERD (gastroesophageal reflux disease)    AKI (acute kidney injury) (HCC) 02/24/2021   HLD (hyperlipidemia) 02/24/2021   Hypoproliferative anemia (HCC) 02/09/2021   Folic acid  deficiency 02/09/2021   Acute on chronic anemia 02/08/2021   Symptomatic anemia 11/21/2020   Amenorrhea 11/20/2018   Well woman exam 11/20/2018   MS (multiple sclerosis) (HCC) 03/14/2018   Stage 3a chronic kidney disease (CKD) (HCC)    Obesity (BMI 30.0-34.9)    Anemia 01/09/2018   Pulmonary embolism (HCC) 09/18/2017   Elevated serum creatinine 05/12/2017   Acute encephalopathy 04/20/2017   Hydronephrosis of left kidney 10/24/2016   Attention deficit 10/24/2016   Left leg weakness 08/13/2016   Chronic pain 08/13/2016   Mild renal insufficiency 08/13/2016   Left-sided weakness    High risk medication use 09/15/2015   Hip pain, bilateral 07/28/2015   Urinary hesitancy 07/28/2015   Multiple sclerosis exacerbation (HCC) 07/17/2015   Urinary disorder  06/11/2015   Gait disturbance 05/19/2015   Numbness 05/19/2015   Depression with anxiety 05/13/2015   Other fatigue 05/13/2015   Left knee pain 05/13/2015   Avascular necrosis of bones of both hips (HCC) 05/13/2015   Screening for HIV (human immunodeficiency virus) 07/08/2014   Essential hypertension, benign 11/19/2013   Anxiety state 11/19/2013   Screen for STD (sexually transmitted disease) 11/01/2013   Encounter for routine gynecological examination 11/01/2013   Oral contraceptive use 10/20/2011   Multiple sclerosis (HCC) 08/08/2007   RASH AND OTHER NONSPECIFIC SKIN ERUPTION 06/22/2007   AVASCULAR NECROSIS, FEMORAL HEAD 03/11/2006   Essential hypertension 02/03/2006   MICROALBUMINURIA 02/03/2006   DVT, HX OF 02/03/2006   PCP:  [name] LABOR, MD Pharmacy:   [id number] - , [place] - [address] AT [facility] OF [address] [place] [address] Phone: [phone] Fax: [phone]

## 2023-10-23 NOTE — Progress Notes (Signed)
 Physical Therapy Quick Note  PT has completed initial evaluation.    Overall, patient at supervision assistance level.   PT Follow up recommended: Outpatient PT Equipment recommended:  Rollator Complete evaluation note to follow.     Bernardino JINNY Ruth, PT, DPT Acute Rehabilitation Office [phone]

## 2023-10-23 NOTE — Telephone Encounter (Signed)
 Last seen on 10/09/23 Follow up scheduled on 05/14/24

## 2023-10-23 NOTE — Progress Notes (Signed)
 Progress Note   Dawn Peters FMW:[id number] DOB: February 24, 1981 DOA: 10/21/2023   Chief Complaint: Weakness of the right lower extremity.  HPI: Dawn Peters is a 43 y.o. female with history of multiple sclerosis with chronic left lower extremity weakness being followed by neurologist Dr. 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  ER physician had discussed with neurologist and orthopedics and they recommended MRI of the T and L-spine which did not show anything acute.  MRI brain and C-spine did not show any new lesions.   On exam patient still has mild weakness of the right lower extremity limited by pain at the hip joint.  I discussed with neurologist on-call Dr. Vanessa who at this time after reviewing patient's MRI suggested there is no signs of any multiple sclerosis exacerbation.  Patient also is concerned that she has some mild upper back pain which happened previously when she had a PE.  UA is positive for leukocyte esterase but does have a lot of squamous epithelial cells.  Cultures were done which is pending.  I reviewed patient's labs and orders medicines.   Review of Systems: As per HPI, rest all negative.   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   left lower extremity-- treated with eliquis    Gait disturbance    due to MS   GERD (gastroesophageal reflux disease)    watch diet   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/p  THA   History of DVT of lower extremity 2007   History of encephalopathy 04/2017   acute metabolic encephalopathy secondary to MS,   resolved   History of MRSA infection 2004   right hip infection post THA   History of pulmonary embolus (PE) 2005   Hydronephrosis, left    w/ acute kidney injury 02/ 2019   Hypertension    Left-sided weakness    due to MS   Migraines    MS (multiple sclerosis) [facility]) neurologist-  dr sherita   dx 2000--   Muscle spasticity    Neurogenic bladder    due to MS   Pulmonary embolism, bilateral (HCC) 09/17/2017   treated w/ eliquis    Strains to urinate    Urgency of urination     Past Surgical History:  Procedure Laterality Date   CYSTOSCOPY WITH RETROGRADE PYELOGRAM, URETEROSCOPY AND STENT PLACEMENT Bilateral 11/29/2017   Procedure: BILATERAL  RETROGRADE PYELOGRAM, LEFT DIAGNOSIC URETEROSCOPY AND STENT LEFT PLACEMENT;  Surgeon: Cam Morene ORN, MD;  Location: [facility];  Service: Urology;  Laterality: Bilateral;   HIP ARTHROSCOPY Left 07-05-2013   dr pill @[facility]    iliopsosas release, synovectomy   REVISION TOTAL HIP ARTHROPLASTY Right 03-28-2003    dr ernie @[facility]    TOTAL HIP ARTHROPLASTY Bilateral left 11-05-2002  dr ernie @[facility] ;   right 06/ 2004  @[facility]      reports that she has never smoked. She has never used smokeless tobacco. She reports that she does not drink alcohol and does not use drugs.  Allergies  Allergen Reactions   Ace Inhibitors  Swelling   Amoxicillin Itching    Did it involve swelling of the face/tongue/throat, SOB, or low BP? Yes Did it involve sudden or severe rash/hives, skin peeling, or any reaction on the inside of your mouth or nose? No Did you need to seek medical attention at a hospital or doctor's office? No When did it last happen?      unknown  If all above answers are "NO", may proceed with cephalosporin use.;   Lisinopril  Swelling    Lower lip swelling    Family History  Problem Relation Age of Onset   Healthy Mother    Healthy Father    Colon cancer Paternal Grandmother    Breast cancer Paternal Grandmother 42   Hypertension  Other    Diabetes Other     Prior to Admission medications   Medication Sig Start Date End Date Taking? Authorizing Provider  amantadine  (SYMMETREL ) 100 MG capsule TAKE 1 CAPSULE(100 MG) BY MOUTH TWICE DAILY 09/29/23  Yes Sater, Charlie LABOR, MD  amitriptyline  (ELAVIL ) 25 MG tablet TAKE 1 TO 2 TABLETS (25 MG TO 50 MG) BY MOUTH AT BEDTIME Patient taking differently: Take 50 mg by mouth at bedtime. 10/03/23  Yes Sater, Charlie LABOR, MD  amLODipine  (NORVASC ) 5 MG tablet Take 1 tablet (5 mg total) by mouth daily. Patient taking differently: Take 5 mg by mouth daily after breakfast. 05/21/17  Yes Arrien, Elidia Sieving, MD  amphetamine -dextroamphetamine  (ADDERALL  XR) 20 MG 24 hr capsule TAKE ONE CAPSULE DAILY AT 9AM (VIAL) 10/03/23  Yes Sater, Charlie LABOR, MD  atorvastatin  (LIPITOR) 20 MG tablet Take 20 mg by mouth daily. 04/19/23  Yes [provider]  baclofen  (LIORESAL ) 10 MG tablet TAKE ONE TABLET BY MOUTH FOUR TIMES DAILY @9AM -1PM-5PM-9PM 07/03/23  Yes Sater, Charlie LABOR, MD  busPIRone  (BUSPAR ) 15 MG tablet TAKE 1 TABLET(15 MG) BY MOUTH TWICE DAILY 08/23/23  Yes Sater, Charlie LABOR, MD  citalopram  (CELEXA ) 20 MG tablet Take 1 tablet (20 mg total) by mouth daily. 07/05/23  Yes Sater, Charlie LABOR, MD  dalfampridine  10 MG TB12 TAKE 1 TABLET BY MOUTH TWICE DAILY 09/13/23  Yes Sater, Charlie LABOR, MD  diphenhydrAMINE  (BENADRYL ) 25 mg capsule Take 25 mg by mouth at bedtime.   Yes [provider]  KESIMPTA  20 MG/0.4ML SOAJ Inject 0.4 mLs into the skin every 30 (thirty) days. Starting at week 4 05/04/23  Yes Sater, Charlie LABOR, MD  metoprolol  succinate (TOPROL -XL) 50 MG 24 hr tablet Take 50 mg by mouth daily. 09/27/20  Yes [provider]  Multiple Vitamin (MULTIVITAMIN WITH MINERALS) TABS tablet Take 1 tablet by mouth daily. 09/20/17  Yes Shona Terry SAILOR, DO  norethindrone  (MICRONOR ) 0.35 MG tablet Take 1 tablet (0.35 mg total) by mouth daily. 11/19/18  Yes Lola Donnice HERO, MD  omeprazole (PRILOSEC) 40 MG  capsule Take 40 mg by mouth daily. 09/27/20  Yes [provider]  oxyCODONE  (OXY IR/ROXICODONE ) 5 MG immediate release tablet Take 5 mg by mouth 2 (two) times daily as needed for severe pain (pain score 7-10).   Yes [provider]  pregabalin  (LYRICA ) 150 MG capsule Take 1 capsule (150 mg total) by mouth 2 (two) times daily. 10/09/23  Yes Sater, Charlie LABOR, MD  rivaroxaban  (XARELTO ) 20 MG TABS tablet Take 20 mg by mouth in the morning.   Yes [provider]  SUMAtriptan  (IMITREX ) 100 MG tablet ORIG} TAKE ONE TABLET BY MOUTH ONE TIME FOR UP TO ONE DOSE AS NEEDED FOR MIGRAINE. MAY REPEAT IN  2 HOURS IF HEADACHE PRESISTS OR RECURS 01/30/23  Yes Sater, Charlie LABOR, MD  tiZANidine  (ZANAFLEX ) 4 MG tablet TAKE ONE TABLET BY MOUTH DAILY AT 9PM EVERY NIGHT AT BEDTIME 08/24/23  Yes Sater, Charlie LABOR, MD  gabapentin  (NEURONTIN ) 800 MG tablet 1 p.o. 4 times daily Patient not taking: Reported on 10/22/2023 07/27/23   Buck Saucer, MD  KESIMPTA  20 MG/0.4ML SOAJ Inject 0.24ml at week 0, 1, and 2 Patient not taking: Reported on 10/22/2023 05/04/23   [name] LABOR, MD  predniSONE  (DELTASONE ) 50 MG tablet 12 pills (600 mg) po daily x 3 days for MS exacerbation Patient not taking: Reported on 10/22/2023 07/05/23   Sater, Charlie LABOR, MD  tamsulosin  (FLOMAX ) 0.4 MG CAPS capsule Take 1 capsule (0.4 mg total) by mouth daily. Patient not taking: Reported on 10/22/2023 04/10/23   [name] LABOR, MD  Teriflunomide  14 MG TABS TAKE 1 TABLET BY MOUTH DAILY Patient not taking: Reported on 10/22/2023 05/11/23   [name] LABOR, MD    Physical Exam: Constitutional: Moderately built and nourished. Vitals:   10/22/23 1311 10/22/23 1425 10/22/23 1934 10/22/23 2300  BP: (!) 140/95  (!) 119/92 (!) 136/90  Pulse: 86  87 81  Resp: 14  16 16   Temp: 97.9 F (36.6 C)  98.1 F (36.7 C) 98.8 F (37.1 C)  TempSrc: Oral     SpO2: 100% 100% 100% 100%   Eyes: Anicteric no pallor. ENMT: No discharge from the ears eyes  nose or mouth. Neck: No mass felt.  No neck rigidity. Respiratory: No rhonchi or crepitations. Cardiovascular: S1-S2 heard. Abdomen: Soft nontender bowel sound present. Musculoskeletal: Mild pain on the right hip area when trying to flex. Skin: No rash. Neurologic: Alert awake oriented time place and person.  Has weakness of the left lower extremity 1 x 5.  Right lower extremity is around 3 x 5. Psychiatric: Appears normal.  Normal affect.   Labs on Admission: I have personally reviewed following labs and imaging studies  CBC: Recent Labs  Lab 10/21/23 1830  WBC 5.7  HGB 14.6  HCT 44.4  MCV 96.1  PLT 254   Basic Metabolic Panel: Recent Labs  Lab 10/21/23 1830  NA 142  K 3.3*  CL 104  CO2 28  GLUCOSE 83  BUN 15  CREATININE 1.33*  CALCIUM  9.3   GFR: Estimated Creatinine Clearance: 56 mL/min (A) (by C-G formula based on SCr of 1.33 mg/dL (H)). Liver Function Tests: No results for input(s): AST, ALT, ALKPHOS, BILITOT, PROT, ALBUMIN in the last 168 hours. No results for input(s): LIPASE, AMYLASE in the last 168 hours. No results for input(s): AMMONIA in the last 168 hours. Coagulation Profile: No results for input(s): INR, PROTIME in the last 168 hours. Cardiac Enzymes: No results for input(s): CKTOTAL, CKMB, CKMBINDEX, TROPONINI in the last 168 hours. BNP (last 3 results) No results for input(s): PROBNP in the last 8760 hours. HbA1C: No results for input(s): HGBA1C in the last 72 hours. CBG: No results for input(s): GLUCAP in the last 168 hours. Lipid Profile: No results for input(s): CHOL, HDL, LDLCALC, TRIG, CHOLHDL, LDLDIRECT in the last 72 hours. Thyroid  Function Tests: No results for input(s): TSH, T4TOTAL, FREET4, T3FREE, THYROIDAB in the last 72 hours. Anemia Panel: No results for input(s): VITAMINB12, FOLATE, FERRITIN, TIBC, IRON, RETICCTPCT in the last 72 hours. Urine analysis:     Component Value Date/Time   COLORURINE YELLOW 10/21/2023 1951   APPEARANCEUR HAZY (A) 10/21/2023 1951   APPEARANCEUR Clear  02/19/2019 1710   LABSPEC 1.018 10/21/2023 1951   PHURINE 5.0 10/21/2023 1951   GLUCOSEU NEGATIVE 10/21/2023 1951   HGBUR SMALL (A) 10/21/2023 1951   BILIRUBINUR NEGATIVE 10/21/2023 1951   BILIRUBINUR Negative 02/19/2019 1710   KETONESUR NEGATIVE 10/21/2023 1951   PROTEINUR NEGATIVE 10/21/2023 1951   UROBILINOGEN 0.2 12/15/2016 1136   NITRITE NEGATIVE 10/21/2023 1951   LEUKOCYTESUR LARGE (A) 10/21/2023 1951   Sepsis Labs: @LABRCNTIP (procalcitonin:4,lacticidven:4) )No results found for this or any previous visit (from the past 240 hours).   Radiological Exams on Admission: MR THORACIC SPINE W WO CONTRAST Result Date: 10/22/2023 EXAM: MRI THORACIC AND LUMBAR SPINE WITH AND WITHOUT INTRAVENOUS CONTRAST 10/22/2023 10:35:08 PM TECHNIQUE: Multiplanar multisequence MRI of the thoracic and lumbar spine was performed with and without the administration of intravenous contrast. COMPARISON: 01/23/2021. Unchanged appearance of partially visualized left upper pole renal cyst. CLINICAL HISTORY: Trunk numbness or tingling. FINDINGS: BONES AND ALIGNMENT: Normal alignment. Normal vertebral body heights. Bone marrow signal is unremarkable. No abnormal enhancement. SPINAL CORD: Normal spinal cord size. Normal spinal cord signal. SOFT TISSUES: Unremarkable. THORACIC DISC LEVELS: No significant disc herniation. No spinal canal stenosis or neural foraminal narrowing. LUMBAR DISC LEVELS: L1-L2: No significant disc herniation. No spinal canal stenosis or neural foraminal narrowing. L2-L3: No significant disc herniation. No spinal canal stenosis or neural foraminal narrowing. L3-L4: No significant disc herniation. No spinal canal stenosis or neural foraminal narrowing. L4-L5: No significant disc herniation. No spinal canal stenosis or neural foraminal narrowing. L5-S1: No significant disc  herniation. No spinal canal stenosis or neural foraminal narrowing. IMPRESSION: 1. No acute findings. 2. No disc herniation, spinal canal stenosis or neural impingement in the thoracic or lumbar spine. Electronically signed by: Franky Stanford MD 10/22/2023 11:03 PM EDT RP Workstation: [id number]   MR Lumbar Spine W Wo Contrast Result Date: 10/22/2023 EXAM: MRI THORACIC AND LUMBAR SPINE WITH AND WITHOUT INTRAVENOUS CONTRAST 10/22/2023 10:35:08 PM TECHNIQUE: Multiplanar multisequence MRI of the thoracic and lumbar spine was performed with and without the administration of intravenous contrast. COMPARISON: 01/23/2021. Unchanged appearance of partially visualized left upper pole renal cyst. CLINICAL HISTORY: Trunk numbness or tingling. FINDINGS: BONES AND ALIGNMENT: Normal alignment. Normal vertebral body heights. Bone marrow signal is unremarkable. No abnormal enhancement. SPINAL CORD: Normal spinal cord size. Normal spinal cord signal. SOFT TISSUES: Unremarkable. THORACIC DISC LEVELS: No significant disc herniation. No spinal canal stenosis or neural foraminal narrowing. LUMBAR DISC LEVELS: L1-L2: No significant disc herniation. No spinal canal stenosis or neural foraminal narrowing. L2-L3: No significant disc herniation. No spinal canal stenosis or neural foraminal narrowing. L3-L4: No significant disc herniation. No spinal canal stenosis or neural foraminal narrowing. L4-L5: No significant disc herniation. No spinal canal stenosis or neural foraminal narrowing. L5-S1: No significant disc herniation. No spinal canal stenosis or neural foraminal narrowing. IMPRESSION: 1. No acute findings. 2. No disc herniation, spinal canal stenosis or neural impingement in the thoracic or lumbar spine. Electronically signed by: Franky Stanford MD 10/22/2023 11:03 PM EDT RP Workstation: [id number]   MR Cervical Spine W and Wo Contrast Result Date: 10/21/2023 CLINICAL DATA:  Multiple sclerosis. EXAM: MRI CERVICAL SPINE WITHOUT AND  WITH CONTRAST TECHNIQUE: Multiplanar and multiecho pulse sequences of the cervical spine, to include the craniocervical junction and cervicothoracic junction, were obtained without and with intravenous contrast. CONTRAST:  8mL GADAVIST  GADOBUTROL  1 MMOL/ML IV SOLN COMPARISON:  Cervical spine MRI 11/21/2020 FINDINGS: Axial sequences are moderately motion degraded. Alignment: Normal. Vertebrae: No fracture, suspicious marrow lesion, or significant marrow  edema. Cord: Similar appearance of mild diffuse hazy cord signal on sagittal T2 and STIR sequences without a definite discrete lesion on motion degraded axial images. No abnormal enhancement. Posterior Fossa, vertebral arteries, paraspinal tissues: Posterior fossa reported separately. Preserved vertebral artery flow voids. Disc levels: Preserved disc heights with no more than minor spondylosis. Widely patent spinal canal. No compressive neural foraminal stenosis. IMPRESSION: Stable appearance of the cervical spinal cord on this motion degraded study. No evidence of active demyelination. Electronically Signed   By: Dasie Hamburg M.D.   On: 10/21/2023 19:59   MR Brain W and Wo Contrast Result Date: 10/21/2023 CLINICAL DATA:  Multiple sclerosis. EXAM: MRI HEAD WITHOUT AND WITH CONTRAST TECHNIQUE: Multiplanar, multiecho pulse sequences of the brain and surrounding structures were obtained without and with intravenous contrast. CONTRAST:  8mL GADAVIST  GADOBUTROL  1 MMOL/ML IV SOLN COMPARISON:  Head MRI 11/21/2020 FINDINGS: Brain: There is no evidence of an acute infarct, intracranial hemorrhage, mass, midline shift, or extra-axial fluid collection. Cerebral volume is unchanged. The ventricles are normal in size. Cerebral white matter T2 hyperintensities are unchanged and greatest in the periventricular white matter where there is confluent signal abnormality extending into the temporal lobes. Hazy T2 hyperintensity in the brainstem and cerebellum around the fourth  ventricle is also unchanged. No abnormal enhancement is identified. Vascular: Major intracranial vascular flow voids are preserved. Skull and upper cervical spine: Unremarkable bone marrow signal. Sinuses/Orbits: Unremarkable orbits. Mild mucosal thickening in the left maxillary sinus. Clear mastoid air cells. Other: None. IMPRESSION: Unchanged white matter disease consistent with multiple sclerosis. No evidence of active demyelination. Electronically Signed   By: Dasie Hamburg M.D.   On: 10/21/2023 19:49      Assessment/Plan Active Problems:   * No active hospital problems. *    Right-sided lower extremity weakness with MRI of the brain C-spine T-spine L-spine showing nothing acute.  I discussed with neurologist Dr. Vanessa who at this time feels that no further neurological workup needed.  Since patient's movement in the right lower extremity is limited by pain will get x-ray of the pelvis.  Will recommend physical therapy consult.  Continue dalfampridine  baclofen  and amantadine  with history of MS and chronic left lower extremity weakness. History of recurrent PE and DVT with patient still complaining of upper back burning sensation which patient states she felt similar to when she had previous PE.  Recommended getting CT angiogram of the chest discussed with ER physician.  Continue Lovenox  dosed for PE for now.  May transition to Xarelto  if there is no new PE. Hypertension on amlodipine . Depression anxiety on citalopram  and BuSpar . Mild hypokalemia replace and recheck.  Time spent 60 minutes.   DVT prophylaxis: Lovenox  full dose. Code Status: Full code. Family Communication: Discussed with patient. Disposition Plan: Based on the CT angiogram results and physical therapy Consults called: Discussed with neurologist.

## 2023-10-23 NOTE — ED Notes (Signed)
 PT at bedside.

## 2023-10-23 NOTE — ED Provider Notes (Signed)
 Pt assessed by Pt.  Pt felt to need outpt pt.  Pt advised pt needs a rollator.  Toc  will have rollator delivered to pt's home.  Pt ordered.   Pt reevaluated.  Pt feels well today.  Pt will contact family for transportation home    Karilyn Wind K, PA-C 10/23/23 [id number]    Emil Share, DO 10/23/23 1141

## 2023-10-23 NOTE — Evaluation (Signed)
 Physical Therapy Evaluation Patient Details Name: Dawn Peters MRN: [id number] DOB: Jan 03, 1981 Today's Date: 10/23/2023  History of Present Illness  43 y.o. female presents to [facility] hospital on 10/21/2023 with increasing weakness, and pain in RLE. Imaging concerning for early PNA but otherwise negative for acute changes. PMH includes MS, anxiety, PE, bilateral THA, HTN, migraines.  Clinical Impression  Pt presents to PT with deficits in strength, power, gait, balance, endurance. Pt has chronic LLE weakness from MS but reports newer onset RLE weakness prior to this ED stay. Pt is able to ambulate for household distances with support of SPC. Pt reports she had been wearing an AFO on her L foot previously but needs a new one, previously losing her referral from her PCP to see an orthotist. Pt reports all of her recent falls have occurred when without DME. PT recommends the pt utilize UE support of DME for all out of bed mobility at this time in an effort to reduce risk of falls. Pt will benefit from receiving a Rollator to allow for energy conservation during community mobility. PT recommends continued outpatient PT at the time of discharge.        If plan is discharge home, recommend the following: A little help with bathing/dressing/bathroom;Assistance with cooking/housework;Help with stairs or ramp for entrance   Can travel by private vehicle        Equipment Recommendations Rollator (4 wheels)  Recommendations for Other Services       Functional Status Assessment Patient has had a recent decline in their functional status and demonstrates the ability to make significant improvements in function in a reasonable and predictable amount of time.     Precautions / Restrictions Precautions Precautions: Fall Recall of Precautions/Restrictions: Intact Restrictions Weight Bearing Restrictions Per Provider Order: No      Mobility  Bed Mobility Overal bed mobility: Modified Independent                   Transfers Overall transfer level: Independent Equipment used: None                    Ambulation/Gait Ambulation/Gait assistance: Supervision Gait Distance (Feet): 150 Feet Assistive device: Straight cane Gait Pattern/deviations: Step-through pattern, Trunk flexed Gait velocity: reduced Gait velocity interpretation: <1.8 ft/sec, indicate of risk for recurrent falls   General Gait Details: pt with trunk flexion and R lateral lean to side of SPC, weight shift to R side likely facilitating improved L foot clearance due to DF weakness  Stairs            Wheelchair Mobility     Tilt Bed    Modified Rankin (Stroke Patients Only)       Balance Overall balance assessment: Needs assistance Sitting-balance support: No upper extremity supported, Feet supported Sitting balance-Leahy Scale: Good     Standing balance support: Single extremity supported, Reliant on assistive device for balance Standing balance-Leahy Scale: Poor                               Pertinent Vitals/Pain Pain Assessment Pain Assessment: 0-10 Pain Score: 8  Pain Location: low back Pain Descriptors / Indicators: Aching Pain Intervention(s): Monitored during session    Home Living Family/patient expects to be discharged to:: Private residence Living Arrangements: Parent Available Help at Discharge: Family;Available PRN/intermittently Type of Home: House Home Access: Level entry       Home Layout: One level Home  Equipment: [place] - single point      Prior Function Prior Level of Function : Independent/Modified Independent;History of Falls (last six months)             Mobility Comments: ambuatory without DME in the home, utilizes [facility] PR[facility] in the community, reports multiple falls the day prior to admission       Extremity/Trunk Assessment   Upper Extremity Assessment Upper Extremity Assessment: Overall WFL for tasks assessed    Lower  Extremity Assessment Lower Extremity Assessment: RLE deficits/detail;LLE deficits/detail RLE Deficits / Details: grossly 4+/5, ROM WFL LLE Deficits / Details: grossly 4/5 other than 4-/5 ankle DF, ROM WFL    Cervical / Trunk Assessment Cervical / Trunk Assessment: Normal  Communication   Communication Communication: No apparent difficulties    Cognition Arousal: Alert Behavior During Therapy: WFL for tasks assessed/performed   PT - Cognitive impairments: No apparent impairments                         Following commands: Intact       Cueing Cueing Techniques: Verbal cues     General Comments General comments (skin integrity, edema, etc.): VSS on RA    Exercises     Assessment/Plan    PT Assessment Patient needs continued PT services  PT Problem List Decreased strength;Decreased activity tolerance;Decreased balance;Decreased mobility;Pain       PT Treatment Interventions DME instruction;Gait training;Stair training;Functional mobility training;Therapeutic activities;Therapeutic exercise;Balance training;Neuromuscular re-education;Patient/family education    PT Goals (Current goals can be found in the Care Plan section)  Acute Rehab PT Goals Patient Stated Goal: to reduce back pain and falls risk PT Goal Formulation: With patient Time For Goal Achievement: 11/06/23 Potential to Achieve Goals: Good Additional Goals Additional Goal #1: Pt will score >19/24 on the DGI to indicate a reduced risk for falls    Frequency Min 1X/week     Co-evaluation               AM-PAC PT 6 Clicks Mobility  Outcome Measure Help needed turning from your back to your side while in a flat bed without using bedrails?: None Help needed moving from lying on your back to sitting on the side of a flat bed without using bedrails?: None Help needed moving to and from a bed to a chair (including a wheelchair)?: None Help needed standing up from a chair using your arms  (e.g., wheelchair or bedside chair)?: None Help needed to walk in hospital room?: A Little Help needed climbing 3-5 steps with a railing? : A Little 6 Click Score: 22    End of Session Equipment Utilized During Treatment: Gait belt Activity Tolerance: Patient tolerated treatment well Patient left: in bed;with call bell/phone within reach Nurse Communication: Mobility status PT Visit Diagnosis: Other abnormalities of gait and mobility (R26.89);Muscle weakness (generalized) (M62.81);Other symptoms and signs involving the nervous system (R29.898)    Time: 9196-9178 PT Time Calculation (min) (ACUTE ONLY): 18 min   Charges:   PT Evaluation $PT Eval Low Complexity: 1 Low   PT General Charges $$ ACUTE PT VISIT: 1 Visit         Bernardino JINNY Ruth, PT, DPT Acute Rehabilitation Office [phone]   Bernardino JINNY Ruth 10/23/2023, 8:36 AM

## 2023-10-23 NOTE — ED Provider Notes (Signed)
 Patient signed out to myself from Lonni Camp, PA-C pending hospitalist consult.  Dr. Franky consulted on the patient and consulted neurology who felt like this was not an acute MS exacerbation the patient was lucid for discharge.  Patient expressed to hospitalist that she was concerned she had another PE.  CT angio PE ordered.  CT angio PE showed no evidence of PE.  Nodular densities with surrounding groundglass opacities in the peripheral right upper lobe, new since prior study, most likely infectious/inflammatory.  Likely reflect early pneumonia.  Consider for admission or further workup however patient's vital signs, physical exam, labs, and imaging have been reassuring.  Patient symptoms likely due to UTI and pneumonia.  Patient waiting for PT consult which will determine disposition.   Francis Ileana SAILOR, PA-C 10/23/23 0247    Melvenia Motto, MD 10/23/23 [phone]

## 2023-10-23 NOTE — Discharge Instructions (Addendum)
 Today you were seen for back pain and right lower extremity weakness.  You were found to have a urinary tract infection and pneumonia.  Please pick up your antibiotic and take as prescribed.  Thank you for letting us  treat you today. After reviewing your labs and imaging, I feel you are safe to go home. Please follow up with your PCP in the next several days and provide them with your records from this visit. Return to the Emergency Room if pain becomes severe or symptoms worsen.

## 2023-10-24 ENCOUNTER — Telehealth: Payer: Self-pay | Admitting: Neurology

## 2023-10-24 NOTE — Telephone Encounter (Addendum)
 Called and spoke with pt. Relayed MRI results per Dr. Duncan note. She verbalized understanding.  She will call [facility] and cx MRIs scheduled for 11/05/23.

## 2023-10-24 NOTE — Telephone Encounter (Signed)
 Dr. Vear-  See notes below. I told her to finish out antibiotics and if no improvement in sx after, to call back. If you recommend anything further, let me know Please review MRI results    Pt saw Dr. Vear 10/09/23 for OV. MS DMT: Kesimpta .   Seen in ED 10/21/23. MRI Brain/cervical scheduled for 11/05/23 at [facility] imaging but it looks like she completed 10/21/23 while at hospital. They also did MRI thoracic/lumbar. She was placed on the following meds at d/c on 10/23/23 x5 days for treatment of UTI/pneumonia.   I called pt at [phone]. She saw PCP yesterday who told her she did not have pneumonia, just early signs. Unsure if she was going to take antibiotic.  Confirmed she is taking antibiotic for UTI. Aware she should finish taking antiobiotics. Any illness/infection can worsen MS sx. If no better after completing, she should let us  know. Aware I will send message to Dr. Vear to review MRI results. We will call back once we receive. She verbalized understanding.    Per ED notes:

## 2023-10-24 NOTE — Telephone Encounter (Signed)
 Pt called stating that her entire left side is weak and that she is basically dragging her left leg to walk . PT is requesting MD fill this medication  predniSONE  (DELTASONE ) 50 MG tablet   Pharmacy  [id number] - [place], [place] - [address] AT [facility] OF [address] (Ph: [phone])

## 2023-10-26 ENCOUNTER — Telehealth: Payer: Self-pay | Admitting: Neurology

## 2023-10-26 NOTE — Telephone Encounter (Signed)
 Pt called stating that she is having a relapse and also a UTI and she is needing to be advised.

## 2023-10-26 NOTE — Telephone Encounter (Signed)
 See phone call 10/26/23. I called pt

## 2023-10-26 NOTE — Telephone Encounter (Signed)
 Called pt at [phone]. Relayed I spoke with Dr. Vear who recommends she finish out treatment for UTI first before he prescribes any sort of steroid. Any infection/illness can worsen MS sx. She will call back early next week if sx have not improved. She does have f/u with PCP to make sure UTI resolves

## 2023-10-27 LAB — CULTURE, BLOOD (ROUTINE X 2)
Culture: NO GROWTH
Culture: NO GROWTH
Special Requests: ADEQUATE

## 2023-10-30 ENCOUNTER — Encounter: Payer: Self-pay | Admitting: Neurology

## 2023-10-30 ENCOUNTER — Telehealth: Payer: Self-pay

## 2023-10-30 NOTE — Telephone Encounter (Signed)
 error

## 2023-10-30 NOTE — Telephone Encounter (Signed)
 Left the patient a voicemail with the rescheduled appointment details per staff message.

## 2023-10-31 ENCOUNTER — Telehealth: Payer: Self-pay | Admitting: Neurology

## 2023-10-31 NOTE — Telephone Encounter (Signed)
 Pt came in with forms to be filled out to receive an aid's care since her condition has worsened. Let pt know there is a fee of $50 for forms to be filled out. Pt explained that is ridiculous and she has never had to pay for a form to be filled out by Dr. Vear before. Stated she would like to speak with Maurilio about paying. I let pt know I could take her forms and she would receive a call if staff is unable to complete them without taking the form fee.

## 2023-10-31 NOTE — Telephone Encounter (Signed)
 Dr. Vear- are you ok with waiving form fee this time?

## 2023-10-31 NOTE — Telephone Encounter (Signed)
 Valery- Dr. Vear agreeable to waive fee this time. We can fill out for pt

## 2023-11-01 NOTE — Telephone Encounter (Signed)
 Gave completed/signed form back to medical records to process for pt.

## 2023-11-01 NOTE — Telephone Encounter (Signed)
 Per Dr. Vear: since completed the Abx could do a gram of IV Solumedrol  I called pt. She will come tomorrow at 9am. Orders provided to [facility].

## 2023-11-02 ENCOUNTER — Telehealth: Payer: Self-pay

## 2023-11-02 NOTE — Telephone Encounter (Signed)
 LVM letting pt know that the Traveling RN Rusty didn't know that he had a pt at 9am for infusion. Let pt know that the [facility] has a open chair at 11am.    Please see Mychart message from today.

## 2023-11-05 ENCOUNTER — Inpatient Hospital Stay: Admission: RE | Admit: 2023-11-05 | Source: Ambulatory Visit

## 2023-11-07 NOTE — Telephone Encounter (Signed)
Debra- can you help with this? Thank you! 

## 2023-11-07 NOTE — Telephone Encounter (Signed)
 Pt called stating  that  The forms need to be faxed to office in [place] . Pt didn't have fax number on hand , But informed me that Fax number should be on Paperwork .

## 2023-11-08 ENCOUNTER — Other Ambulatory Visit: Payer: Self-pay

## 2023-11-08 DIAGNOSIS — E538 Deficiency of other specified B group vitamins: Secondary | ICD-10-CM

## 2023-11-08 DIAGNOSIS — Z86711 Personal history of pulmonary embolism: Secondary | ICD-10-CM

## 2023-11-08 NOTE — Telephone Encounter (Signed)
 Completed form faxed. Copy and original given to MR

## 2023-11-09 ENCOUNTER — Inpatient Hospital Stay

## 2023-11-19 ENCOUNTER — Emergency Department (HOSPITAL_COMMUNITY)
Admission: EM | Admit: 2023-11-19 | Discharge: 2023-11-20 | Disposition: A | Discharge status: Home / Self Care | Attending: Emergency Medicine | Admitting: Emergency Medicine

## 2023-11-19 ENCOUNTER — Other Ambulatory Visit: Payer: Self-pay

## 2023-11-19 ENCOUNTER — Encounter (HOSPITAL_COMMUNITY): Payer: Self-pay

## 2023-11-19 DIAGNOSIS — Z79899 Other long term (current) drug therapy: Secondary | ICD-10-CM | POA: Insufficient documentation

## 2023-11-19 DIAGNOSIS — Z7901 Long term (current) use of anticoagulants: Secondary | ICD-10-CM | POA: Insufficient documentation

## 2023-11-19 DIAGNOSIS — R918 Other nonspecific abnormal finding of lung field: Secondary | ICD-10-CM | POA: Diagnosis not present

## 2023-11-19 DIAGNOSIS — I10 Essential (primary) hypertension: Secondary | ICD-10-CM | POA: Insufficient documentation

## 2023-11-19 DIAGNOSIS — M7989 Other specified soft tissue disorders: Secondary | ICD-10-CM

## 2023-11-19 DIAGNOSIS — R600 Localized edema: Secondary | ICD-10-CM | POA: Diagnosis not present

## 2023-11-19 NOTE — ED Triage Notes (Signed)
 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 Pt is on Xarelto . Bilateral leg swelling and burning that started yesterday. Pt is concerned for another blood clot.

## 2023-11-20 ENCOUNTER — Emergency Department (HOSPITAL_COMMUNITY)

## 2023-11-20 ENCOUNTER — Ambulatory Visit (INDEPENDENT_AMBULATORY_CARE_PROVIDER_SITE_OTHER)

## 2023-11-20 ENCOUNTER — Other Ambulatory Visit: Payer: Self-pay

## 2023-11-20 VITALS — BP 138/91 | HR 78 | Temp 98.5°F | Ht 66.0 in | Wt 177.0 lb

## 2023-11-20 DIAGNOSIS — R7989 Other specified abnormal findings of blood chemistry: Secondary | ICD-10-CM | POA: Diagnosis not present

## 2023-11-20 DIAGNOSIS — R600 Localized edema: Secondary | ICD-10-CM | POA: Diagnosis not present

## 2023-11-20 DIAGNOSIS — Z23 Encounter for immunization: Secondary | ICD-10-CM | POA: Diagnosis not present

## 2023-11-20 DIAGNOSIS — N179 Acute kidney failure, unspecified: Secondary | ICD-10-CM | POA: Diagnosis not present

## 2023-11-20 DIAGNOSIS — R918 Other nonspecific abnormal finding of lung field: Secondary | ICD-10-CM | POA: Diagnosis not present

## 2023-11-20 LAB — CBC WITH DIFFERENTIAL/PLATELET
Abs Immature Granulocytes: 0.04 K/uL (ref 0.00–0.07)
Basophils Absolute: 0 K/uL (ref 0.0–0.1)
Basophils Relative: 0 %
Eosinophils Absolute: 0.2 K/uL (ref 0.0–0.5)
Eosinophils Relative: 3 %
HCT: 36.8 % (ref 36.0–46.0)
Hemoglobin: 12.2 g/dL (ref 12.0–15.0)
Immature Granulocytes: 1 %
Lymphocytes Relative: 42 %
Lymphs Abs: 2.7 K/uL (ref 0.7–4.0)
MCH: 32.6 pg (ref 26.0–34.0)
MCHC: 33.2 g/dL (ref 30.0–36.0)
MCV: 98.4 fL (ref 80.0–100.0)
Monocytes Absolute: 0.7 K/uL (ref 0.1–1.0)
Monocytes Relative: 11 %
Neutro Abs: 2.8 K/uL (ref 1.7–7.7)
Neutrophils Relative %: 43 %
Platelets: 206 K/uL (ref 150–400)
RBC: 3.74 MIL/uL — ABNORMAL LOW (ref 3.87–5.11)
RDW: 15.3 % (ref 11.5–15.5)
WBC: 6.4 K/uL (ref 4.0–10.5)
nRBC: 0 % (ref 0.0–0.2)

## 2023-11-20 LAB — URINALYSIS, ROUTINE W REFLEX MICROSCOPIC
Bilirubin Urine: NEGATIVE
Glucose, UA: NEGATIVE mg/dL
Hgb urine dipstick: NEGATIVE
Ketones, ur: NEGATIVE mg/dL
Leukocytes,Ua: NEGATIVE
Nitrite: NEGATIVE
Protein, ur: NEGATIVE mg/dL
Specific Gravity, Urine: 1.01 (ref 1.005–1.030)
pH: 8 (ref 5.0–8.0)

## 2023-11-20 LAB — COMPREHENSIVE METABOLIC PANEL WITH GFR
ALT: 18 U/L (ref 0–44)
AST: 21 U/L (ref 15–41)
Albumin: 3.9 g/dL (ref 3.5–5.0)
Alkaline Phosphatase: 79 U/L (ref 38–126)
Anion gap: 10 (ref 5–15)
BUN: 13 mg/dL (ref 6–20)
CO2: 28 mmol/L (ref 22–32)
Calcium: 8.9 mg/dL (ref 8.9–10.3)
Chloride: 106 mmol/L (ref 98–111)
Creatinine, Ser: 1.66 mg/dL — ABNORMAL HIGH (ref 0.44–1.00)
GFR, Estimated: 38 mL/min — ABNORMAL LOW (ref >60)
Glucose, Bld: 78 mg/dL (ref 70–99)
Potassium: 3.7 mmol/L (ref 3.5–5.1)
Sodium: 143 mmol/L (ref 135–145)
Total Bilirubin: 0.5 mg/dL (ref 0.0–1.2)
Total Protein: 5.8 g/dL — ABNORMAL LOW (ref 6.5–8.1)

## 2023-11-20 LAB — HCG, QUANTITATIVE, PREGNANCY: hCG, Beta Chain, Quant, S: 5 m[IU]/mL — ABNORMAL HIGH (ref <5)

## 2023-11-20 LAB — HCG, SERUM, QUALITATIVE: Preg, Serum: NEGATIVE

## 2023-11-20 LAB — TROPONIN T, HIGH SENSITIVITY
Troponin T High Sensitivity: <15 ng/L (ref 0–19)
Troponin T High Sensitivity: <15 ng/L (ref 0–19)

## 2023-11-20 LAB — PRO BRAIN NATRIURETIC PEPTIDE: Pro Brain Natriuretic Peptide: 428 pg/mL — ABNORMAL HIGH (ref <300.0)

## 2023-11-20 MED ORDER — IOHEXOL 350 MG/ML SOLN
100.0000 mL | Freq: Once | INTRAVENOUS | Status: AC | PRN
  Administered 2023-11-20: 100 mL via INTRAVENOUS

## 2023-11-20 MED ORDER — AMPHETAMINE-DEXTROAMPHET ER 20 MG PO CP24: ORAL_CAPSULE | ORAL | 0 refills | Status: DC

## 2023-11-20 MED ORDER — FUROSEMIDE 40 MG PO TABS
20.0000 mg | ORAL_TABLET | Freq: Once | ORAL | Status: AC
  Administered 2023-11-20: 20 mg via ORAL
  Filled 2023-11-20: qty 1

## 2023-11-20 MED ORDER — CEFUROXIME AXETIL 500 MG PO TABS: 500.0000 mg | ORAL_TABLET | Freq: Two times a day (BID) | ORAL | 0 refills | Status: AC

## 2023-11-20 MED ORDER — DOXYCYCLINE HYCLATE 100 MG PO CAPS: 100.0000 mg | ORAL_CAPSULE | Freq: Two times a day (BID) | ORAL | 0 refills | Status: AC

## 2023-11-20 MED ORDER — FUROSEMIDE 20 MG PO TABS: 20.0000 mg | ORAL_TABLET | Freq: Every day | ORAL | 0 refills | Status: AC

## 2023-11-20 NOTE — Discharge Instructions (Addendum)
 Your testing shows no evidence of blood clot in your lung.  Return tomorrow to have an ultrasound of your legs to rule out blood clot.  Continue your Xarelto . Your CT scan did show a chronic infiltrate to your right lung concerning for possible chronic pneumonia.  Take the antibiotics as prescribed and follow-up with the lung doctor for further assessment of this as it may need a biopsy to confirm this is nothing cancerous. Return to the ED with new or worsening symptoms

## 2023-11-20 NOTE — ED Notes (Signed)
 Patient transported to CT

## 2023-11-20 NOTE — Patient Instructions (Signed)
 It was nice meeting you in the clinic today.  Please check in to see if you can still get the ultrasound of your legs checked out.  Your heart enzyme BNP was elevated on your ER visit I ordered an echocardiogram which is a Holter ultrasound for you to do   In regards to the infiltrate we see on your chest CT scan.  Please finish the antibiotic course that you were given.  We will repeat a chest x-ray in 2 weeks.  In the meantime please call if your symptoms worsen in any way  I will call you back with the results of your x-ray

## 2023-11-20 NOTE — Assessment & Plan Note (Signed)
-

## 2023-11-20 NOTE — Progress Notes (Signed)
 Subjective:   PATIENT ID: Dawn Peters GENDER: female DOB: 12/17/1980, MRN: [id number]   HPI 43 year old female with a past medical history of multiple sclerosis, DVT and PE previously failed Eliquis  on Xarelto , GERD, hypertension who initially presented to the emergency room mid August with weakness and lower extremity swelling.  At that time concern for PE so a CT chest was performed which showed an infiltrate and the right upper lobe posterior segment, she was given a course of cefpodoxime  and doxycycline  which she states she has completed.  She was discharged from the ER and presented again today with worsening lower extremity bilateral swelling.  Laboratory workup with an elevated BNP.  Repeat CT chest with no PE but shows worsening of right upper lobe infiltrate, she was given a second course of cefuroxime  as well as furosemide .  Patient subsequently referred to the pulmonary clinic for further evaluation.  Urine analysis in the ER with no proteinuria no testing for hematuria performed.  Laboratory workup otherwise negative.  Liver enzymes within normal.  Albumin is normal.  In regards to her lung infiltrate.  She is completely asymptomatic.  She denies any shortness of breath or chest pain.  She denies any cough or mucus production.  She denies any fevers or chills or night sweats.  She denies any weight loss or loss of appetite.   She has no prior pulmonary history.  She does not use any drugs or smoke cigarettes.     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   left lower extremity-- treated with eliquis    Gait disturbance    due to MS   GERD (gastroesophageal reflux disease)    watch diet   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/p   THA   History of DVT of lower extremity 2007   History of encephalopathy 04/2017   acute metabolic encephalopathy secondary to MS,  resolved   History of MRSA infection 2004   right hip infection post THA   History of pulmonary embolus (PE) 2005   Hydronephrosis, left    w/ acute kidney injury 02/ 2019   Hypertension    Left-sided weakness    due to MS   Migraines    MS (multiple sclerosis) [facility]) neurologist-  dr sherita   dx 2000--   Muscle spasticity    Neurogenic bladder    due to MS   Pulmonary embolism, bilateral (HCC) 09/17/2017   treated w/ eliquis    Strains to urinate    Urgency of urination      Family History  Problem Relation Age of Onset   Healthy Mother    Healthy Father    Colon cancer Paternal Grandmother    Breast cancer Paternal Grandmother 45   Hypertension Other    Diabetes Other      Social History   Socioeconomic History   Marital status: Single    Spouse name: Not on file   Number of children: Not on file   Years of education: Not on file   Highest education level: Not on file  Occupational History   Not on file  Tobacco Use   Smoking status: Never   Smokeless tobacco: Never  Vaping  Use   Vaping status: Never Used  Substance and Sexual Activity   Alcohol use: No   Drug use: No   Sexual activity: Not Currently  Other Topics Concern   Not on file  Social History Narrative   Not on file   Social Drivers of Health   Financial Resource Strain: Low Risk  (04/13/2021)   Received from [facility]   Overall Financial Resource Strain (CARDIA)    Difficulty of Paying Living Expenses: Not very hard  Food Insecurity: No Food Insecurity (04/20/2023)   Hunger Vital Sign    Worried About Running Out of Food in the Last Year: Never true    Ran Out of Food in the Last Year: Never true  Transportation Needs: No Transportation Needs (04/20/2023)   PRAPARE - Administrator, Civil Service (Medical): No    Lack of Transportation  (Non-Medical): No  Physical Activity: Unknown (04/13/2021)   Received from [facility]   Exercise Vital Sign    On average, how many days per week do you engage in moderate to strenuous exercise (like a brisk walk)?: 1 day    Minutes of Exercise per Session: Not on file  Stress: No Stress Concern Present (04/13/2021)   Received from [facility] - Occupational Stress Questionnaire    Feeling of Stress : Only a little  Social Connections: Unknown (07/05/2021)   Received from [facility]   Social Network    Social Network: Not on file  Recent Concern: Social Connections - Moderately Isolated (04/13/2021)   Received from [facility]   Social Connection and Isolation Panel    In a typical week, how many times do you talk on the phone with family, friends, or neighbors?: More than three times a week    How often do you get together with friends or relatives?: Twice a week    How often do you attend church or religious services?: More than 4 times per year    Do you belong to any clubs or organizations such as church groups, unions, fraternal or athletic groups, or school groups?: No    How often do you attend meetings of the clubs or organizations you belong to?: Never    Are you married, widowed, divorced, separated, never married, or living with a partner?: Separated  Intimate Partner Violence: Not At Risk (04/20/2023)   Humiliation, Afraid, Rape, and Kick questionnaire    Fear of Current or Ex-Partner: No    Emotionally Abused: No    Physically Abused: No    Sexually Abused: No     Allergies  Allergen Reactions   Ace Inhibitors Swelling   Amoxicillin Itching    Did it involve swelling of the face/tongue/throat, SOB, or low BP? Yes Did it involve sudden or severe rash/hives, skin peeling, or any reaction on the inside of your mouth or nose? No Did you need to seek medical attention at a hospital or doctor's office? No When did it last  happen?      unknown  If Peters above answers are "NO", may proceed with cephalosporin use.;   Lisinopril  Swelling    Lower lip swelling     Outpatient Medications Prior to Visit  Medication Sig Dispense Refill   amantadine  (SYMMETREL ) 100 MG capsule TAKE 1 CAPSULE(100 MG) BY MOUTH TWICE DAILY 180 capsule 0   amitriptyline  (ELAVIL ) 25 MG tablet TAKE 1 TO 2 TABLETS (25 MG TO 50 MG) BY MOUTH AT  BEDTIME (Patient taking differently: Take 50 mg by mouth at bedtime.) 180 tablet 0   amLODipine  (NORVASC ) 5 MG tablet Take 1 tablet (5 mg total) by mouth daily. (Patient taking differently: Take 5 mg by mouth daily after breakfast.) 90 tablet 0   amphetamine -dextroamphetamine  (ADDERALL  XR) 20 MG 24 hr capsule TAKE ONE CAPSULE DAILY AT 9AM (VIAL) 30 capsule 0   atorvastatin  (LIPITOR) 20 MG tablet Take 20 mg by mouth daily.     baclofen  (LIORESAL ) 10 MG tablet TAKE ONE TABLET BY MOUTH FOUR TIMES DAILY @9AM -1PM-5PM-9PM 360 tablet 3   busPIRone  (BUSPAR ) 15 MG tablet TAKE 1 TABLET(15 MG) BY MOUTH TWICE DAILY 180 tablet 0   cefUROXime  (CEFTIN ) 500 MG tablet Take 1 tablet (500 mg total) by mouth 2 (two) times daily with a meal. 14 tablet 0   citalopram  (CELEXA ) 20 MG tablet Take 1 tablet (20 mg total) by mouth daily. 90 tablet 2   dalfampridine  10 MG TB12 TAKE 1 TABLET BY MOUTH TWICE DAILY 60 tablet 6   diphenhydrAMINE  (BENADRYL ) 25 mg capsule Take 25 mg by mouth at bedtime.     doxycycline  (VIBRAMYCIN ) 100 MG capsule Take 1 capsule (100 mg total) by mouth 2 (two) times daily. 20 capsule 0   furosemide  (LASIX ) 20 MG tablet Take 1 tablet (20 mg total) by mouth daily. 3 tablet 0   KESIMPTA  20 MG/0.4ML SOAJ Inject 0.4 mLs into the skin every 30 (thirty) days. Starting at week 4 0.4 mL 11   metoprolol  succinate (TOPROL -XL) 50 MG 24 hr tablet Take 50 mg by mouth daily.     Multiple Vitamin (MULTIVITAMIN WITH MINERALS) TABS tablet Take 1 tablet by mouth daily. 30 tablet 0   norethindrone  (MICRONOR ) 0.35 MG tablet Take 1  tablet (0.35 mg total) by mouth daily. 1 Package 11   omeprazole (PRILOSEC) 40 MG capsule Take 40 mg by mouth daily.     oxyCODONE  (OXY IR/ROXICODONE ) 5 MG immediate release tablet Take 5 mg by mouth 2 (two) times daily as needed for severe pain (pain score 7-10).     pregabalin  (LYRICA ) 150 MG capsule Take 1 capsule (150 mg total) by mouth 2 (two) times daily. 180 capsule 1   rivaroxaban  (XARELTO ) 20 MG TABS tablet Take 20 mg by mouth in the morning.     SUMAtriptan  (IMITREX ) 100 MG tablet ORIG} TAKE ONE TABLET BY MOUTH ONE TIME FOR UP TO ONE DOSE AS NEEDED FOR MIGRAINE. MAY REPEAT IN 2 HOURS IF HEADACHE PRESISTS OR RECURS 10 tablet 0   tamsulosin  (FLOMAX ) 0.4 MG CAPS capsule Take 1 capsule (0.4 mg total) by mouth daily. 90 capsule 3   tiZANidine  (ZANAFLEX ) 4 MG tablet TAKE ONE TABLET BY MOUTH DAILY AT 9PM EVERY NIGHT AT BEDTIME (Patient taking differently: Take 4 mg by mouth at bedtime as needed.) 30 tablet 4   KESIMPTA  20 MG/0.4ML SOAJ Inject 0.4ml at week 0, 1, and 2 (Patient not taking: Reported on 11/20/2023) 1.2 mL 0   predniSONE  (DELTASONE ) 50 MG tablet 12 pills (600 mg) po daily x 3 days for MS exacerbation (Patient not taking: Reported on 11/20/2023) 36 tablet 0   Teriflunomide  14 MG TABS TAKE 1 TABLET BY MOUTH DAILY (Patient not taking: Reported on 11/20/2023) 30 tablet 4   gabapentin  (NEURONTIN ) 800 MG tablet 1 p.o. 4 times daily (Patient not taking: Reported on 10/22/2023) 360 tablet 2   Facility-Administered Medications Prior to Visit  Medication Dose Route Frequency Provider Last Rate Last Admin   0.9 %  sodium chloride  infusion (Manually program via Guardrails IV Fluids)  250 mL Intravenous Once Shadad, Firas N, MD       acetaminophen  (TYLENOL ) 325 MG tablet            diphenhydrAMINE  (BENADRYL ) 25 mg capsule             ROS Reviewed Peters systems and reported negative except as above     Objective:   Vitals:   11/20/23 1553  BP: (!) 138/91  Pulse: 78  Temp: 98.5 F (36.9 C)   TempSrc: Oral  SpO2: 95%  Weight: 177 lb (80.3 kg)  Height: 5' 6 (1.676 m)    Physical Exam General: Middle-aged female not in acute distress Chest: Clear to auscultation bilaterally Heart: Regular rate and rhythm, normal S1, S2 Abdomen: Soft, nontender, nondistended Extremities: Warm, +2 edema    CBC    Component Value Date/Time   WBC 6.4 11/20/2023 0049   RBC 3.74 (L) 11/20/2023 0049   HGB 12.2 11/20/2023 0049   HGB 15.0 10/09/2023 1523   HCT 36.8 11/20/2023 0049   HCT 45.3 10/09/2023 1523   PLT 206 11/20/2023 0049   PLT 224 10/09/2023 1523   MCV 98.4 11/20/2023 0049   MCV 97 10/09/2023 1523   MCH 32.6 11/20/2023 0049   MCHC 33.2 11/20/2023 0049   RDW 15.3 11/20/2023 0049   RDW 13.7 10/09/2023 1523   LYMPHSABS 2.7 11/20/2023 0049   LYMPHSABS 2.7 10/09/2023 1523   MONOABS 0.7 11/20/2023 0049   EOSABS 0.2 11/20/2023 0049   EOSABS 0.2 10/09/2023 1523   BASOSABS 0.0 11/20/2023 0049   BASOSABS 0.0 10/09/2023 1523     Chest imaging:  PFT:     No data to display          Labs: Above        Assessment & Plan:   Assessment & Plan Lower extremity edema Patient with +2 lower extremity edema, elevated creatinine at 1.66 close to baseline but on the higher limit.  BNP elevated at 428.  I am worried about a cardiac etiology causing her lower extremity swelling.  No chest pain.  Troponins were negative in the ER today.  I advised her to get the ultrasound of her lower extremities that was ordered in the ER.  I ordered an echocardiogram for her.  UA checked but without checking for RBCs.  No proteinuria.  Liver enzymes within normal.  Although less likely but is still possible to develop a VTE on Xarelto  if she had failed Eliquis  in the past.  Reassuring that her CT angiogram was negative though.  I advised her to go to the ER or reach out to me or her PCP if her symptoms worsen in any way Lung infiltrate Etiology could be related to cryptogenic organizing  pneumonia.  Plan to obtain a repeat chest x-ray in 2 weeks, she will complete antibiotic course prescribed by the ER.  Discussed potentially doing a bronchoscopy for further evaluation if infiltrate does not resolve.  Patient is in agreement and understands. Elevated brain natriuretic peptide (BNP) level As above AKI (acute kidney injury) (HCC) As above  Orders Placed This Encounter  Procedures   DG Chest 2 View    Standing Status:   Future    Expected Date:   12/04/2023    Expiration Date:   11/19/2024    Reason for Exam (SYMPTOM  OR DIAGNOSIS REQUIRED):   lung infiltrate    Is patient pregnant?:   No  Preferred imaging location?:   [facility]   ANCA Screen Reflex Titer    Standing Status:   Future    Expiration Date:   11/19/2024   Antinuclear Antib (ANA)    Standing Status:   Future    Expiration Date:   11/19/2024   Basic Metabolic Panel (BMET)    Standing Status:   Future    Expiration Date:   11/19/2024   ECHOCARDIOGRAM COMPLETE    Standing Status:   Future    Expiration Date:   11/19/2024    Where should this test be performed:   [facility]    Perflutren DEFINITY (image enhancing agent) should be administered unless hypersensitivity or allergy exist:   Administer Perflutren    Reason for exam-Echo:   Murmur R01.1      Zola Herter, MD [facility] Office: [phone]

## 2023-11-20 NOTE — ED Provider Notes (Signed)
 [facility] EMERGENCY DEPART[facility] AT [facility] Provider Note   CSN: [id number] Arrival date & time: 11/19/23  2336     Patient presents with: Leg Swelling   Dawn Peters is a 43 y.o. female.   Patient with past multiple sclerosis, DVT, PE on Xarelto , neurogenic bladder, GERD, hypertension presents with 2 days of leg pain and swelling.  She denies any fall or traumatic injury.  She reports increasing swelling to her bilateral lower legs for the past 2 days with a burning sensation to her right posterior calf.  Denies any new numbness or weakness in her legs secondary to MS and feels like her weakness is at baseline.  States compliance with her Xarelto .  Has not missed any doses since February.  She apparently developed a blood clot while she was Eliquis  and was switched to Xarelto  but has not missed any doses since February.  Denies any chest pain or shortness of breath.  No cough or fever.  No history of CHF.  No abdominal pain, nausea or vomiting.  Unknown if she has any weight changes.  The history is provided by the patient.       Prior to Admission medications   Medication Sig Start Date End Date Taking? Authorizing Provider  amantadine  (SYMMETREL ) 100 MG capsule TAKE 1 CAPSULE(100 MG) BY MOUTH TWICE DAILY 09/29/23   Sater, Charlie LABOR, MD  amitriptyline  (ELAVIL ) 25 MG tablet TAKE 1 TO 2 TABLETS (25 MG TO 50 MG) BY MOUTH AT BEDTIME Patient taking differently: Take 50 mg by mouth at bedtime. 10/03/23   Sater, Charlie LABOR, MD  amLODipine  (NORVASC ) 5 MG tablet Take 1 tablet (5 mg total) by mouth daily. Patient taking differently: Take 5 mg by mouth daily after breakfast. 05/21/17   Arrien, Elidia Sieving, MD  amphetamine -dextroamphetamine  (ADDERALL  XR) 20 MG 24 hr capsule TAKE ONE CAPSULE DAILY AT 9AM (VIAL) 10/03/23   Sater, Charlie LABOR, MD  atorvastatin  (LIPITOR) 20 MG tablet Take 20 mg by mouth daily. 04/19/23   [provider]  baclofen  (LIORESAL ) 10 MG tablet TAKE  ONE TABLET BY MOUTH FOUR TIMES DAILY @9AM -1PM-5PM-9PM 07/03/23   Sater, Charlie LABOR, MD  busPIRone  (BUSPAR ) 15 MG tablet TAKE 1 TABLET(15 MG) BY MOUTH TWICE DAILY 08/23/23   Sater, Charlie LABOR, MD  citalopram  (CELEXA ) 20 MG tablet Take 1 tablet (20 mg total) by mouth daily. 07/05/23   Sater, Charlie LABOR, MD  dalfampridine  10 MG TB12 TAKE 1 TABLET BY MOUTH TWICE DAILY 10/23/23   Sater, Charlie LABOR, MD  diphenhydrAMINE  (BENADRYL ) 25 mg capsule Take 25 mg by mouth at bedtime.    [provider]  doxycycline  (VIBRAMYCIN ) 100 MG capsule Take 1 capsule (100 mg total) by mouth 2 (two) times daily. 10/23/23   Keith, Kayla N, PA-C  gabapentin  (NEURONTIN ) 800 MG tablet 1 p.o. 4 times daily Patient not taking: Reported on 10/22/2023 07/27/23   Buck Saucer, MD  KESIMPTA  20 MG/0.4ML SOAJ Inject 0.4 mLs into the skin every 30 (thirty) days. Starting at week 4 05/04/23   Sater, Charlie LABOR, MD  KESIMPTA  20 MG/0.4ML SOAJ Inject 0.40ml at week 0, 1, and 2 Patient not taking: Reported on 10/22/2023 05/04/23   [name] LABOR, MD  metoprolol  succinate (TOPROL -XL) 50 MG 24 hr tablet Take 50 mg by mouth daily. 09/27/20   [provider]  Multiple Vitamin (MULTIVITAMIN WITH MINERALS) TABS tablet Take 1 tablet by mouth daily. 09/20/17   Shona Terry SAILOR, DO  norethindrone  (MICRONOR ) 0.35  MG tablet Take 1 tablet (0.35 mg total) by mouth daily. 11/19/18   Lola Donnice HERO, MD  omeprazole (PRILOSEC) 40 MG capsule Take 40 mg by mouth daily. 09/27/20   [provider]  oxyCODONE  (OXY IR/ROXICODONE ) 5 MG immediate release tablet Take 5 mg by mouth 2 (two) times daily as needed for severe pain (pain score 7-10).    [provider]  predniSONE  (DELTASONE ) 50 MG tablet 12 pills (600 mg) po daily x 3 days for MS exacerbation Patient not taking: Reported on 10/22/2023 07/05/23   Sater, Charlie LABOR, MD  pregabalin  (LYRICA ) 150 MG capsule Take 1 capsule (150 mg total) by mouth 2 (two) times daily. 10/09/23   Sater, Charlie LABOR,  MD  rivaroxaban  (XARELTO ) 20 MG TABS tablet Take 20 mg by mouth in the morning.    [provider]  SUMAtriptan  (IMITREX ) 100 MG tablet ORIG} TAKE ONE TABLET BY MOUTH ONE TIME FOR UP TO ONE DOSE AS NEEDED FOR MIGRAINE. MAY REPEAT IN 2 HOURS IF HEADACHE PRESISTS OR RECURS 01/30/23   Sater, Charlie LABOR, MD  tamsulosin  (FLOMAX ) 0.4 MG CAPS capsule Take 1 capsule (0.4 mg total) by mouth daily. Patient not taking: Reported on 10/22/2023 04/10/23   [name] LABOR, MD  Teriflunomide  14 MG TABS TAKE 1 TABLET BY MOUTH DAILY Patient not taking: Reported on 10/22/2023 05/11/23   [name] LABOR, MD  tiZANidine  (ZANAFLEX ) 4 MG tablet TAKE ONE TABLET BY MOUTH DAILY AT 9PM EVERY NIGHT AT BEDTIME 08/24/23   Sater, Charlie LABOR, MD    Allergies: Ace inhibitors, Amoxicillin, and Lisinopril     Review of Systems  Constitutional:  Negative for activity change, appetite change and fever.  HENT:  Negative for congestion and rhinorrhea.   Respiratory:  Negative for cough, chest tightness and shortness of breath.   Cardiovascular:  Positive for leg swelling. Negative for chest pain.  Gastrointestinal:  Negative for abdominal pain, nausea and vomiting.  Genitourinary:  Negative for dysuria and hematuria.  Musculoskeletal:  Negative for arthralgias and myalgias.  Skin:  Negative for rash.  Neurological:  Negative for dizziness, weakness and headaches.   all other systems are negative except as noted in the HPI and PMH.    Updated Vital Signs BP (!) 143/92 (BP Location: Left Arm)   Pulse 88   Temp 97.9 F (36.6 C) (Oral)   Resp 17   Ht 5' 6 (1.676 m)   Wt 78.5 kg   SpO2 100%   BMI 27.92 kg/m   Physical Exam Vitals and nursing note reviewed.  Constitutional:      General: She is not in acute distress.    Appearance: She is well-developed.  HENT:     Head: Normocephalic and atraumatic.     Mouth/Throat:     Pharynx: No oropharyngeal exudate.  Eyes:     Conjunctiva/sclera: Conjunctivae normal.      Pupils: Pupils are equal, round, and reactive to light.  Neck:     Comments: No meningismus. Cardiovascular:     Rate and Rhythm: Normal rate and regular rhythm.     Heart sounds: Normal heart sounds. No murmur heard. Pulmonary:     Effort: Pulmonary effort is normal. No respiratory distress.     Breath sounds: Normal breath sounds.  Abdominal:     Palpations: Abdomen is soft.     Tenderness: There is no abdominal tenderness. There is no guarding or rebound.  Musculoskeletal:        General: No tenderness. Normal  range of motion.     Cervical back: Normal range of motion and neck supple.     Right lower leg: Edema present.     Left lower leg: Edema present.     Comments: +2 edema bilaterally.  Intact DP and PT pulses.  Calf is tender but symmetrically swollen  Skin:    General: Skin is warm.  Neurological:     Mental Status: She is alert and oriented to person, place, and time.     Cranial Nerves: No cranial nerve deficit.     Motor: No abnormal muscle tone.     Coordination: Coordination normal.     Comments:  5/5 strength throughout. CN 2-12 intact.Equal grip strength.   Psychiatric:        Behavior: Behavior normal.     (all labs ordered are listed, but only abnormal results are displayed) Labs Reviewed  CBC WITH DIFFERENTIAL/PLATELET - Abnormal; Notable for the following components:      Result Value   RBC 3.74 (*)    All other components within normal limits  COMPREHENSIVE METABOLIC PANEL WITH GFR - Abnormal; Notable for the following components:   Creatinine, Ser 1.66 (*)    Total Protein 5.8 (*)    GFR, Estimated 38 (*)    All other components within normal limits  PRO BRAIN NATRIURETIC PEPTIDE - Abnormal; Notable for the following components:   Pro Brain Natriuretic Peptide 428.0 (*)    All other components within normal limits  URINALYSIS, ROUTINE W REFLEX MICROSCOPIC - Abnormal; Notable for the following components:   Color, Urine STRAW (*)    All other  components within normal limits  HCG, SERUM, QUALITATIVE  TROPONIN T, HIGH SENSITIVITY  TROPONIN T, HIGH SENSITIVITY    EKG: EKG Interpretation Date/Time:  Monday November 20 2023 03:56:59 EDT Ventricular Rate:  90 PR Interval:  177 QRS Duration:  95 QT Interval:  392 QTC Calculation: 480 R Axis:   7  Text Interpretation: Sinus rhythm Abnormal R-wave progression, early transition Borderline T wave abnormalities duplicate Confirmed by Carita Senior [phone]) on 11/20/2023 4:01:58 AM  Radiology: [facility] Chest Portable 1 View Result Date: 11/20/2023 CLINICAL DATA:  Shortness of breath EXAM: PORTABLE CHEST 1 VIEW COMPARISON:  10/23/2023 CT FINDINGS: Cardiac shadows within normal limits. The lungs are well aerated bilaterally. Somewhat nodular density seen on prior CT examination is again identified in the right upper lobe. No new focal infiltrate or effusion is seen. No bony abnormality is noted. IMPRESSION: Stable somewhat nodular density in the right upper lobe. Electronically Signed   By: Oneil Devonshire M.D.   On: 11/20/2023 01:03     Procedures   Medications Ordered in the ED - No data to display                                  Medical Decision Making Amount and/or Complexity of Data Reviewed Labs: ordered. Decision-making details documented in ED Course. Radiology: ordered and independent interpretation performed. Decision-making details documented in ED Course. ECG/medicine tests: ordered and independent interpretation performed. Decision-making details documented in ED Course.  Risk Prescription drug management.   Patient with lower leg swelling for 2 days with a history of DVT on Xarelto .  Denies any missed doses of Eliquis .  Stable vitals.  No hypoxia.  No chest pain or shortness of breath.  Ultrasound not available Chest x-ray stable nodular densities right upper lobe.  EKG is  normal sinus rhythm and nonspecific T wave changes similar to previous. BNP not significantly  elevated Echocardiogram in February 2025 showed EF 60 to 65%. Creatinine near baseline. Troponin negative x 2 with low suspicion for ACS. CTA negative for pulmonary embolism.  Does show nodular infiltrate of right upper lobe which appears to be chronic infectious or inflammatory process.  Thinning of the left kidney similar to prior examination.  CT venogram abdomen pelvis is negative for clot.  Hydroureter similar to previous CT scan.  Patient is not hypoxic.  She is in no distress.  Will have her return tomorrow for ultrasound of her legs to rule out DVT.  Patient made aware of CT findings and need for outpatient follow-up with pulmonology given irregularity of right upper lobe infiltrate.  Will give additional course of antibiotics.  Discussed she should follow-up with pulmonology to ensure this resolves and is nothing cancerous. Hydronephrosis and hydroureter similar to previous examination  Will give short course of Lasix  to help with edema.  Follow-up for ultrasound tomorrow to rule out DVT.  No evidence of PE.  Continue Xarelto . Return to ED with new or worsening symptoms.      Final diagnoses:  Leg swelling  Abnormal CT scan of lung    ED Discharge Orders     None          Avey Mcmanamon, Garnette, MD 11/20/23 [phone]

## 2023-11-21 ENCOUNTER — Other Ambulatory Visit (INDEPENDENT_AMBULATORY_CARE_PROVIDER_SITE_OTHER)

## 2023-11-21 DIAGNOSIS — R918 Other nonspecific abnormal finding of lung field: Secondary | ICD-10-CM

## 2023-11-21 DIAGNOSIS — R600 Localized edema: Secondary | ICD-10-CM | POA: Diagnosis not present

## 2023-11-21 DIAGNOSIS — N179 Acute kidney failure, unspecified: Secondary | ICD-10-CM | POA: Diagnosis not present

## 2023-11-21 DIAGNOSIS — R7989 Other specified abnormal findings of blood chemistry: Secondary | ICD-10-CM

## 2023-11-21 LAB — BASIC METABOLIC PANEL WITH GFR
BUN: 11 mg/dL (ref 6–23)
CO2: 29 meq/L (ref 19–32)
Calcium: 9.5 mg/dL (ref 8.4–10.5)
Chloride: 107 meq/L (ref 96–112)
Creatinine, Ser: 1.34 mg/dL — ABNORMAL HIGH (ref 0.40–1.20)
GFR: 47.91 mL/min — ABNORMAL LOW (ref >60.00)
Glucose, Bld: 73 mg/dL (ref 70–99)
Potassium: 3.8 meq/L (ref 3.5–5.1)
Sodium: 137 meq/L (ref 135–145)

## 2023-11-21 NOTE — Addendum Note (Signed)
 Addended by: Kaili Castille M on: 11/21/2023 08:46 AM   Modules accepted: Orders

## 2023-11-22 ENCOUNTER — Ambulatory Visit (HOSPITAL_COMMUNITY)
Admission: RE | Admit: 2023-11-22 | Discharge: 2023-11-22 | Disposition: A | Discharge status: Home / Self Care | Source: Ambulatory Visit | Attending: Vascular Surgery | Admitting: Vascular Surgery

## 2023-11-22 ENCOUNTER — Encounter: Payer: Self-pay | Admitting: Oncology

## 2023-11-22 DIAGNOSIS — M7989 Other specified soft tissue disorders: Secondary | ICD-10-CM | POA: Diagnosis not present

## 2023-11-27 ENCOUNTER — Emergency Department (HOSPITAL_COMMUNITY)
Admission: EM | Admit: 2023-11-27 | Discharge: 2023-11-27 | Disposition: A | Discharge status: Home / Self Care | Attending: Emergency Medicine | Admitting: Emergency Medicine

## 2023-11-27 ENCOUNTER — Encounter (HOSPITAL_COMMUNITY): Payer: Self-pay

## 2023-11-27 ENCOUNTER — Other Ambulatory Visit: Payer: Self-pay

## 2023-11-27 ENCOUNTER — Emergency Department (EMERGENCY_DEPARTMENT_HOSPITAL)

## 2023-11-27 DIAGNOSIS — Z79899 Other long term (current) drug therapy: Secondary | ICD-10-CM | POA: Diagnosis not present

## 2023-11-27 DIAGNOSIS — R609 Edema, unspecified: Secondary | ICD-10-CM | POA: Diagnosis not present

## 2023-11-27 DIAGNOSIS — R2242 Localized swelling, mass and lump, left lower limb: Secondary | ICD-10-CM

## 2023-11-27 DIAGNOSIS — M7989 Other specified soft tissue disorders: Secondary | ICD-10-CM | POA: Diagnosis present

## 2023-11-27 DIAGNOSIS — Z7901 Long term (current) use of anticoagulants: Secondary | ICD-10-CM | POA: Diagnosis not present

## 2023-11-27 LAB — CBC
HCT: 41.1 % (ref 36.0–46.0)
Hemoglobin: 13.9 g/dL (ref 12.0–15.0)
MCH: 32.7 pg (ref 26.0–34.0)
MCHC: 33.8 g/dL (ref 30.0–36.0)
MCV: 96.7 fL (ref 80.0–100.0)
Platelets: 249 K/uL (ref 150–400)
RBC: 4.25 MIL/uL (ref 3.87–5.11)
RDW: 14.8 % (ref 11.5–15.5)
WBC: 7.4 K/uL (ref 4.0–10.5)
nRBC: 0 % (ref 0.0–0.2)

## 2023-11-27 LAB — PROTIME-INR
INR: 1.8 — ABNORMAL HIGH (ref 0.8–1.2)
Prothrombin Time: 22.2 s — ABNORMAL HIGH (ref 11.4–15.2)

## 2023-11-27 MED ORDER — PREDNISONE 50 MG PO TABS: ORAL_TABLET | ORAL | 0 refills | Status: AC

## 2023-11-27 NOTE — Progress Notes (Signed)
 VASCULAR LAB    Bilateral lower extremity venous duplex has been performed.  See CV proc for preliminary results.   Rayquon Uselman, RVT 11/27/2023, 4:19 PM

## 2023-11-27 NOTE — ED Triage Notes (Signed)
 Pt came in for possible blood clot in her left leg. Pt has a couple knots on her left foot with redness. Pt's foot is also swollen and painful.  Hx blood clots

## 2023-11-27 NOTE — ED Provider Notes (Signed)
 [place] EMERGENCY DEPARTMENT AT [facility] Provider Note   CSN: [id number] Arrival date & time: 11/27/23  1443     Patient presents with: Possible Blood Clot   Dawn Peters is a 43 y.o. female.   Patient complains of swelling to her left foot.  Patient states that she has a swollen area on the top of her left foot she is concerned that it could be a blood clot.  Patient states that she noticed the area about a week ago.  She thought that it could be a bug bite.  Patient reports she tried topical medications without relief.  Patient has a past medical history of a pulmonary embolus and DVT in the past.  She states she is currently taking her blood thinner as directed.  Patient  is currently on doxycycline  for possible pneumonia.  Patient denies any injuries.  Patient denies fever or chills.  Patient is concerned about allergic reaction  The history is provided by the patient. No language interpreter was used.       Prior to Admission medications   Medication Sig Start Date End Date Taking? Authorizing Provider  predniSONE  (DELTASONE ) 50 MG tablet One tablet a day for 5 days 11/27/23  Yes Mckenna Boruff K, PA-C  amantadine  (SYMMETREL ) 100 MG capsule TAKE 1 CAPSULE(100 MG) BY MOUTH TWICE DAILY 09/29/23   Sater, Charlie LABOR, MD  amitriptyline  (ELAVIL ) 25 MG tablet TAKE 1 TO 2 TABLETS (25 MG TO 50 MG) BY MOUTH AT BEDTIME Patient taking differently: Take 50 mg by mouth at bedtime. 10/03/23   Sater, Charlie LABOR, MD  amLODipine  (NORVASC ) 5 MG tablet Take 1 tablet (5 mg total) by mouth daily. Patient taking differently: Take 5 mg by mouth daily after breakfast. 05/21/17   Arrien, Elidia Sieving, MD  amphetamine -dextroamphetamine  (ADDERALL  XR) 20 MG 24 hr capsule TAKE ONE CAPSULE DAILY AT 9AM (VIAL) 11/20/23   Sater, Charlie LABOR, MD  atorvastatin  (LIPITOR) 20 MG tablet Take 20 mg by mouth daily. 04/19/23   [provider]  baclofen  (LIORESAL ) 10 MG tablet TAKE ONE TABLET BY  MOUTH FOUR TIMES DAILY @9AM -1PM-5PM-9PM 07/03/23   Sater, Charlie LABOR, MD  busPIRone  (BUSPAR ) 15 MG tablet TAKE 1 TABLET(15 MG) BY MOUTH TWICE DAILY 08/23/23   Sater, Charlie LABOR, MD  cefUROXime  (CEFTIN ) 500 MG tablet Take 1 tablet (500 mg total) by mouth 2 (two) times daily with a meal. 11/20/23   Rancour, Garnette, MD  citalopram  (CELEXA ) 20 MG tablet Take 1 tablet (20 mg total) by mouth daily. 07/05/23   Sater, Charlie LABOR, MD  dalfampridine  10 MG TB12 TAKE 1 TABLET BY MOUTH TWICE DAILY 10/23/23   Sater, Charlie LABOR, MD  diphenhydrAMINE  (BENADRYL ) 25 mg capsule Take 25 mg by mouth at bedtime.    [provider]  doxycycline  (VIBRAMYCIN ) 100 MG capsule Take 1 capsule (100 mg total) by mouth 2 (two) times daily. 11/20/23   Rancour, Garnette, MD  furosemide  (LASIX ) 20 MG tablet Take 1 tablet (20 mg total) by mouth daily. 11/20/23   Rancour, Garnette, MD  KESIMPTA  20 MG/0.4ML SOAJ Inject 0.4 mLs into the skin every 30 (thirty) days. Starting at week 4 05/04/23   Sater, Charlie LABOR, MD  KESIMPTA  20 MG/0.4ML SOAJ Inject 0.41ml at week 0, 1, and 2 Patient not taking: Reported on 11/20/2023 05/04/23   [name] LABOR, MD  metoprolol  succinate (TOPROL -XL) 50 MG 24 hr tablet Take 50 mg by mouth daily. 09/27/20   [provider]  Multiple Vitamin (MULTIVITAMIN WITH MINERALS) TABS tablet Take 1 tablet by mouth daily. 09/20/17   Shona Terry SAILOR, DO  norethindrone  (MICRONOR ) 0.35 MG tablet Take 1 tablet (0.35 mg total) by mouth daily. 11/19/18   Lola Donnice HERO, MD  omeprazole (PRILOSEC) 40 MG capsule Take 40 mg by mouth daily. 09/27/20   [provider]  oxyCODONE  (OXY IR/ROXICODONE ) 5 MG immediate release tablet Take 5 mg by mouth 2 (two) times daily as needed for severe pain (pain score 7-10).    [provider]  pregabalin  (LYRICA ) 150 MG capsule Take 1 capsule (150 mg total) by mouth 2 (two) times daily. 10/09/23   Sater, Charlie LABOR, MD  rivaroxaban  (XARELTO ) 20 MG TABS tablet Take 20 mg by mouth  in the morning.    [provider]  SUMAtriptan  (IMITREX ) 100 MG tablet ORIG} TAKE ONE TABLET BY MOUTH ONE TIME FOR UP TO ONE DOSE AS NEEDED FOR MIGRAINE. MAY REPEAT IN 2 HOURS IF HEADACHE PRESISTS OR RECURS 01/30/23   Sater, Charlie LABOR, MD  tamsulosin  (FLOMAX ) 0.4 MG CAPS capsule Take 1 capsule (0.4 mg total) by mouth daily. 04/10/23   Sater, Charlie LABOR, MD  Teriflunomide  14 MG TABS TAKE 1 TABLET BY MOUTH DAILY Patient not taking: Reported on 11/20/2023 05/11/23   Sater, Charlie LABOR, MD  tiZANidine  (ZANAFLEX ) 4 MG tablet TAKE ONE TABLET BY MOUTH DAILY AT 9PM EVERY NIGHT AT BEDTIME Patient taking differently: Take 4 mg by mouth at bedtime as needed. 08/24/23   Sater, Charlie LABOR, MD    Allergies: Ace inhibitors, Amoxicillin, and Lisinopril     Review of Systems  Musculoskeletal:  Positive for myalgias.  All other systems reviewed and are negative.   Updated Vital Signs BP 128/71   Pulse 83   Temp 98.4 F (36.9 C) (Oral)   SpO2 99%   Physical Exam Vitals and nursing note reviewed.  Constitutional:      Appearance: She is well-developed.  HENT:     Head: Normocephalic.  Cardiovascular:     Rate and Rhythm: Normal rate.     Pulses: Normal pulses.  Pulmonary:     Effort: Pulmonary effort is normal.  Abdominal:     General: There is no distension.  Musculoskeletal:        General: Swelling and tenderness present.     Cervical back: Normal range of motion.     Comments: Swelling dorsal left foot, no deformity  nv and ns intact   Neurological:     General: No focal deficit present.     Mental Status: She is alert and oriented to person, place, and time.     (all labs ordered are listed, but only abnormal results are displayed) Labs Reviewed  PROTIME-INR - Abnormal; Notable for the following components:      Result Value   Prothrombin Time 22.2 (*)    INR 1.8 (*)    All other components within normal limits  CBC  BASIC METABOLIC PANEL WITH GFR     EKG: None  Radiology: VAS US  LOWER EXTREMITY VENOUS (DVT) Result Date: 11/27/2023  Lower Venous DVT Study Patient Name:  Dawn Peters  Date of Exam:   11/27/2023 Medical Rec #: [id number]              Accession #:    [id number] Date of Birth: 16-Jul-1980               Patient Gender: F Patient Age:   18 years Exam Location:  [facility] Procedure:      VAS US  LOWER EXTREMITY VENOUS (DVT) Referring Phys: BURGESS NANAVATI --------------------------------------------------------------------------------  Indications: Swelling, and Knot/lump on dorsum of left foot. Other Indications: Multiple sclerosis. Risk Factors: Confirmed PE 2019 DVT 04/2023 right popliteal vein Stage IIIa kidney disease. Anticoagulation: Xarelto . Limitations: Poor ultrasound/tissue interface and Edema. Comparison Study: Prior negative bilateral LEV done 11/22/23 Performing Technologist: Alberta Lis RVS  Examination Guidelines: A complete evaluation includes B-mode imaging, spectral Doppler, color Doppler, and power Doppler as needed of all accessible portions of each vessel. Bilateral testing is considered an integral part of a complete examination. Limited examinations for reoccurring indications may be performed as noted. The reflux portion of the exam is performed with the patient in reverse Trendelenburg.  +---------+---------------+---------+-----------+----------+-------------------+ RIGHT    CompressibilityPhasicitySpontaneityPropertiesThrombus Aging      +---------+---------------+---------+-----------+----------+-------------------+ CFV      Full           Yes      Yes                                      +---------+---------------+---------+-----------+----------+-------------------+ SFJ      Full                                                             +---------+---------------+---------+-----------+----------+-------------------+ FV Prox  Full           Yes      Yes                                       +---------+---------------+---------+-----------+----------+-------------------+ FV Mid   Full                                                             +---------+---------------+---------+-----------+----------+-------------------+ FV DistalFull           Yes      Yes                                      +---------+---------------+---------+-----------+----------+-------------------+ PFV      Full           Yes      Yes                                      +---------+---------------+---------+-----------+----------+-------------------+ POP      Full           Yes      Yes                                      +---------+---------------+---------+-----------+----------+-------------------+ PTV  Not well visualized +---------+---------------+---------+-----------+----------+-------------------+ PERO                                                  Not well visualized +---------+---------------+---------+-----------+----------+-------------------+   +---------+---------------+---------+-----------+----------+-------------------+ LEFT     CompressibilityPhasicitySpontaneityPropertiesThrombus Aging      +---------+---------------+---------+-----------+----------+-------------------+ CFV      Full           Yes      Yes                                      +---------+---------------+---------+-----------+----------+-------------------+ SFJ      Full                                                             +---------+---------------+---------+-----------+----------+-------------------+ FV Prox  Full           Yes      Yes                                      +---------+---------------+---------+-----------+----------+-------------------+ FV Mid   Full                                                              +---------+---------------+---------+-----------+----------+-------------------+ FV DistalFull           Yes      Yes                                      +---------+---------------+---------+-----------+----------+-------------------+ PFV      Full                                                             +---------+---------------+---------+-----------+----------+-------------------+ POP      Full           Yes      Yes                                      +---------+---------------+---------+-----------+----------+-------------------+ PTV      Full                                                             +---------+---------------+---------+-----------+----------+-------------------+ PERO  Not well visualized +---------+---------------+---------+-----------+----------+-------------------+     Summary: RIGHT: - Findings appear essentially unchanged compared to previous examination. - There is no evidence of deep vein thrombosis in the lower extremity. However, portions of this examination were limited- see technologist comments above.  - No cystic structure found in the popliteal fossa.  LEFT: - Findings appear essentially unchanged compared to previous examination. - There is no evidence of deep vein thrombosis in the lower extremity. However, portions of this examination were limited- see technologist comments above.  - No cystic structure found in the popliteal fossa. - Area of mixed echoes measuring 2 X 0.75 cm noted at site of concern on dorsum of foot. Etiology unknown.  *See table(s) above for measurements and observations. Electronically signed by Debby Robertson on 11/27/2023 at 4:48:44 PM.    Final      Procedures   Medications Ordered in the ED - No data to display                                  Medical Decision Making Patient complains of swelling to her left foot.  Patient is concerned that she has a  DVT.  Patient is also concerned that she could be having an allergic reaction  Amount and/or Complexity of Data Reviewed Labs: ordered.    Details: Labs ordered and reviewed CBC shows no evidence of white blood cell count Radiology: ordered and independent interpretation performed. Decision-making details documented in ED Course.    Details: Vascular ultrasound bilateral lower extremities shows no evidence of DVT  Risk Prescription drug management. Risk Details: Pt seen and evaluated by Dr. Bari.   Pt given rx for prednisone .  Patient advised to follow-up with her primary care physician for recheck. Patient given a referral to podiatry for follow-up if area of swelling persist.        Final diagnoses:  Localized swelling of left foot    ED Discharge Orders          Ordered    predniSONE  (DELTASONE ) 50 MG tablet        11/27/23 1749          An After Visit Summary was printed and given to the patient.      Flint Sonny POUR, PA-C 11/27/23 2106    Bari Roxie HERO, DO 12/05/23 2237

## 2023-11-28 LAB — ANA: Anti Nuclear Antibody (ANA): NEGATIVE

## 2023-11-28 LAB — ANCA SCREEN W REFLEX TITER: ANCA SCREEN: NEGATIVE

## 2023-12-04 ENCOUNTER — Encounter: Payer: Self-pay | Admitting: Oncology

## 2023-12-07 ENCOUNTER — Other Ambulatory Visit: Payer: Self-pay | Admitting: Family Medicine

## 2023-12-07 ENCOUNTER — Ambulatory Visit (HOSPITAL_COMMUNITY)
Admission: RE | Admit: 2023-12-07 | Discharge: 2023-12-07 | Disposition: A | Discharge status: Home / Self Care | Source: Ambulatory Visit

## 2023-12-07 DIAGNOSIS — R7989 Other specified abnormal findings of blood chemistry: Secondary | ICD-10-CM | POA: Diagnosis not present

## 2023-12-07 DIAGNOSIS — R600 Localized edema: Secondary | ICD-10-CM | POA: Insufficient documentation

## 2023-12-07 DIAGNOSIS — M79672 Pain in left foot: Secondary | ICD-10-CM

## 2023-12-07 DIAGNOSIS — I340 Nonrheumatic mitral (valve) insufficiency: Secondary | ICD-10-CM | POA: Diagnosis not present

## 2023-12-07 DIAGNOSIS — R011 Cardiac murmur, unspecified: Secondary | ICD-10-CM | POA: Insufficient documentation

## 2023-12-07 DIAGNOSIS — M7989 Other specified soft tissue disorders: Secondary | ICD-10-CM

## 2023-12-07 LAB — ECHOCARDIOGRAM COMPLETE
AR max vel: 2.55 cm2
AV Area VTI: 2.61 cm2
AV Area mean vel: 2.35 cm2
AV Mean grad: 3 mmHg
AV Peak grad: 6.1 mmHg
Ao pk vel: 1.23 m/s
Area-P 1/2: 6.22 cm2
Calc EF: 61.6 %
MV M vel: 5.18 m/s
MV Peak grad: 107.3 mmHg
MV VTI: 2.48 cm2
S' Lateral: 3 cm
Single Plane A2C EF: 63.7 %
Single Plane A4C EF: 58.1 %

## 2023-12-15 ENCOUNTER — Ambulatory Visit
Admission: RE | Admit: 2023-12-15 | Discharge: 2023-12-15 | Disposition: A | Discharge status: Home / Self Care | Source: Ambulatory Visit | Attending: Family Medicine | Admitting: Family Medicine

## 2023-12-15 ENCOUNTER — Encounter: Payer: Self-pay | Admitting: Oncology

## 2023-12-15 DIAGNOSIS — M7989 Other specified soft tissue disorders: Secondary | ICD-10-CM

## 2023-12-15 DIAGNOSIS — M79672 Pain in left foot: Secondary | ICD-10-CM

## 2023-12-18 ENCOUNTER — Other Ambulatory Visit: Payer: Self-pay | Admitting: Family Medicine

## 2023-12-18 DIAGNOSIS — M79672 Pain in left foot: Secondary | ICD-10-CM

## 2023-12-28 ENCOUNTER — Other Ambulatory Visit: Payer: Self-pay | Admitting: Neurology

## 2023-12-28 MED ORDER — AMPHETAMINE-DEXTROAMPHET ER 20 MG PO CP24: ORAL_CAPSULE | ORAL | 0 refills | Status: DC

## 2023-12-28 NOTE — Telephone Encounter (Signed)
 Dr. Vear- ok to refill? No mention in last OV if you were continuing refilling/rx'ing  Last seen 10/09/23. Next f/u scheduled for 05/14/24. Last refilled 11/20/23 #30.

## 2023-12-28 NOTE — Telephone Encounter (Signed)
 Pt is needing a refill request for her amphetamine -dextroamphetamine  (ADDERALL  XR) 20 MG 24 hr capsule sent in to the [facility] on [address]

## 2023-12-31 ENCOUNTER — Other Ambulatory Visit: Payer: Self-pay | Admitting: Neurology

## 2024-01-01 NOTE — Telephone Encounter (Signed)
 Last seen on 10/09/23 Follow up scheduled on 05/14/24

## 2024-01-18 ENCOUNTER — Other Ambulatory Visit: Payer: Self-pay | Admitting: Neurology

## 2024-01-18 ENCOUNTER — Telehealth: Payer: Self-pay | Admitting: Neurology

## 2024-01-18 MED ORDER — PREDNISONE 50 MG PO TABS: ORAL_TABLET | ORAL | 0 refills | Status: AC

## 2024-01-18 NOTE — Telephone Encounter (Signed)
 Pt's mother has returned call to RN

## 2024-01-18 NOTE — Telephone Encounter (Signed)
 Pt called stating that she is dragging her L lef and she just fell today. She is wanting to know if predniSONE  (DELTASONE ) 50 MG tablet can be called in for her. Please advise.

## 2024-01-18 NOTE — Telephone Encounter (Signed)
 Attempted to call patient, no  answer. Mother called right back.  Spoke with patient who  reports left leg dragging more than usual. Taking kisempta but doesn't feel like it is helping as much as it was. She is using the AFO device. She has fallen 3 times in the last 3 days. She asking if prednisone  can be called in for her to help with this as it has helped in the [place]. Advised I would send to Dr. Vear for review.

## 2024-02-14 ENCOUNTER — Other Ambulatory Visit: Payer: Self-pay | Admitting: Neurology

## 2024-02-14 NOTE — Telephone Encounter (Signed)
 Last seen on 10/09/23 Follow up scheduled on 05/14/24   Did you want patient to continue Rx?  Rx pending

## 2024-02-15 ENCOUNTER — Other Ambulatory Visit: Payer: Self-pay | Admitting: Neurology

## 2024-02-15 MED ORDER — AMPHETAMINE-DEXTROAMPHET ER 20 MG PO CP24: ORAL_CAPSULE | ORAL | 0 refills | Status: DC

## 2024-02-15 NOTE — Telephone Encounter (Signed)
 Requested Prescriptions   Pending Prescriptions Disp Refills   amphetamine -dextroamphetamine  (ADDERALL  XR) 20 MG 24 hr capsule 30 capsule 0    Sig: TAKE ONE CAPSULE DAILY AT 9AM (VIAL)   Last seen 10/09/23 Next appt 05/14/24 Dispenses   Dispensed Days Supply Quantity Provider Pharmacy  AMPHETAMINE /DEXTROAMPHETAMINE   20 MG CP24 12/29/2023 30 30 capsule Sater, Charlie LABOR, MD [facility] #...  AMPHETAMINE /DEXTROAMPHETAMINE   20 MG CP24 11/20/2023 30 30 capsule Sater, Charlie LABOR, MD [facility] #...  AMPHETAMINE /DEXTROAMPHETAMINE   20 MG CP24 10/03/2023 30 30 capsule Sater, Charlie LABOR, MD [facility]ORE #...  AMPHETAMINE /DEXTROAMPHETAMINE   20 MG CP24 07/28/2023 30 30 capsule Athar, Saima, MD [facility] #[facility]

## 2024-02-15 NOTE — Telephone Encounter (Signed)
Pt is requesting a refill for amphetamine-dextroamphetamine (ADDERALL XR) 20 MG 24 hr capsule. ° °Pharmacy: [id number]  ° °

## 2024-03-12 ENCOUNTER — Telehealth: Payer: Self-pay | Admitting: Neurology

## 2024-03-12 NOTE — Telephone Encounter (Signed)
 Please review

## 2024-03-12 NOTE — Telephone Encounter (Signed)
 Alongside Kesimpta  Young) Ineed of Prior Authorization for KESIMPTA  20 MG/0.4ML SOAJ.  Request PA fax to [phone]

## 2024-03-14 ENCOUNTER — Other Ambulatory Visit (HOSPITAL_COMMUNITY): Payer: Self-pay

## 2024-03-18 ENCOUNTER — Other Ambulatory Visit (HOSPITAL_COMMUNITY): Payer: Self-pay

## 2024-03-18 ENCOUNTER — Encounter: Payer: Self-pay | Admitting: Oncology

## 2024-03-18 ENCOUNTER — Telehealth: Payer: Self-pay

## 2024-03-18 NOTE — Telephone Encounter (Signed)
 Pharmacy Patient Advocate Encounter   Received notification from Physician's Office that prior authorization for Kesimpta  is required/requested.   Insurance verification completed.   The patient is insured through [facility].   Per test claim: Refill too soon. PA is not needed at this time. Medication was filled 03/09/2024. Next eligible fill date is 04/01/2024.

## 2024-04-03 ENCOUNTER — Other Ambulatory Visit: Payer: Self-pay | Admitting: Neurology

## 2024-04-03 MED ORDER — AMPHETAMINE-DEXTROAMPHET ER 20 MG PO CP24: ORAL_CAPSULE | ORAL | 0 refills | Status: AC

## 2024-04-03 NOTE — Telephone Encounter (Signed)
 Pt called to request medication refill  amphetamine -dextroamphetamine  (ADDERALL  XR) 20 MG 24 hr capsule  Pt medication is to be sent to    [id number] - [place], [place] - [address] AT [facility] [address] (Ph: [phone])

## 2024-04-03 NOTE — Telephone Encounter (Signed)
 Requested Prescriptions   Pending Prescriptions Disp Refills   amphetamine -dextroamphetamine  (ADDERALL  XR) 20 MG 24 hr capsule 30 capsule 0    Sig: TAKE ONE CAPSULE DAILY AT 9AM (VIAL)   Last seen 10/09/23 Next appt 05/14/24  Dispenses   Dispensed Days Supply Quantity Provider Pharmacy  D-AMPHETAMINE  ER 20MG  SALT COMBO CP 02/20/2024 30 30 each Sater, Charlie LABOR, MD [facility] #...  D-AMPHETAMINE  ER 20MG  SALT COMBO CP 12/29/2023 30 30 each Sater, Charlie LABOR, MD [facility] #...  D-AMPHETAMINE  ER 20MG  SALT COMBO CP 11/20/2023 30 30 each Sater, Charlie LABOR, MD [facility]ORE #...  D-AMPHETAMINE  ER 20MG  SALT COMBO CP 10/03/2023 30 30 each Sater, Charlie LABOR, MD [facility] STORE #...  D-AMPHETAMINE  ER 20MG  SALT COMBO CP 07/28/2023 30 30 each Athar, Saima, MD [facility] STORE #...  D-AMPHETAMINE  ER 20MG  SALT COMBO CP 06/23/2023 30 30 each Sater, Charlie LABOR, MD [facility] STORE #...  D-AMPHETAMINE  ER 20MG  SALT COMBO CP 05/13/2023 30 30 each Sater, Charlie LABOR, MD [facility] STORE #[facility]

## 2024-05-14 ENCOUNTER — Ambulatory Visit: Admitting: Neurology
